# Patient Record
Sex: Male | Born: 1975 | Race: White | Hispanic: No | Marital: Single | Smoking: Current every day smoker
Health system: Southern US, Community
[De-identification: ages and names within clinical notes are randomized; demographics above are authoritative.]

## PROBLEM LIST (undated history)

## (undated) DIAGNOSIS — F32A Depression, unspecified: Secondary | ICD-10-CM

## (undated) DIAGNOSIS — K219 Gastro-esophageal reflux disease without esophagitis: Secondary | ICD-10-CM

## (undated) DIAGNOSIS — J45909 Unspecified asthma, uncomplicated: Secondary | ICD-10-CM

## (undated) DIAGNOSIS — N289 Disorder of kidney and ureter, unspecified: Secondary | ICD-10-CM

## (undated) DIAGNOSIS — M1990 Unspecified osteoarthritis, unspecified site: Secondary | ICD-10-CM

## (undated) DIAGNOSIS — J449 Chronic obstructive pulmonary disease, unspecified: Secondary | ICD-10-CM

## (undated) DIAGNOSIS — R569 Unspecified convulsions: Secondary | ICD-10-CM

## (undated) DIAGNOSIS — G9340 Encephalopathy, unspecified: Secondary | ICD-10-CM

## (undated) DIAGNOSIS — E871 Hypo-osmolality and hyponatremia: Secondary | ICD-10-CM

## (undated) DIAGNOSIS — I509 Heart failure, unspecified: Secondary | ICD-10-CM

## (undated) DIAGNOSIS — F329 Major depressive disorder, single episode, unspecified: Secondary | ICD-10-CM

## (undated) DIAGNOSIS — I10 Essential (primary) hypertension: Secondary | ICD-10-CM

## (undated) DIAGNOSIS — F419 Anxiety disorder, unspecified: Secondary | ICD-10-CM

## (undated) HISTORY — PX: COLONOSCOPY: SHX174

## (undated) HISTORY — PX: FINGER SURGERY: SHX640

## (undated) HISTORY — PX: ESOPHAGOGASTRODUODENOSCOPY: SHX1529

---

## 1999-04-13 ENCOUNTER — Inpatient Hospital Stay (HOSPITAL_COMMUNITY): Admission: AD | Admit: 1999-04-13 | Discharge: 1999-04-17 | Payer: Self-pay | Admitting: *Deleted

## 2005-01-06 ENCOUNTER — Emergency Department: Payer: Self-pay | Admitting: Internal Medicine

## 2005-01-08 ENCOUNTER — Emergency Department: Payer: Self-pay | Admitting: Unknown Physician Specialty

## 2005-01-13 ENCOUNTER — Emergency Department: Payer: Self-pay | Admitting: Emergency Medicine

## 2005-01-30 ENCOUNTER — Emergency Department: Payer: Self-pay | Admitting: Emergency Medicine

## 2005-02-01 ENCOUNTER — Emergency Department: Payer: Self-pay | Admitting: Unknown Physician Specialty

## 2005-02-14 ENCOUNTER — Emergency Department: Payer: Self-pay | Admitting: Emergency Medicine

## 2005-02-21 ENCOUNTER — Emergency Department: Payer: Self-pay | Admitting: Emergency Medicine

## 2005-02-24 ENCOUNTER — Inpatient Hospital Stay: Payer: Self-pay | Admitting: Internal Medicine

## 2005-07-07 ENCOUNTER — Other Ambulatory Visit: Payer: Self-pay

## 2005-07-07 ENCOUNTER — Emergency Department: Payer: Self-pay | Admitting: Internal Medicine

## 2005-10-14 ENCOUNTER — Emergency Department: Payer: Self-pay | Admitting: General Practice

## 2005-11-03 ENCOUNTER — Emergency Department: Payer: Self-pay | Admitting: Emergency Medicine

## 2005-11-03 ENCOUNTER — Other Ambulatory Visit: Payer: Self-pay

## 2005-11-06 ENCOUNTER — Emergency Department: Payer: Self-pay | Admitting: Internal Medicine

## 2005-12-01 ENCOUNTER — Emergency Department: Payer: Self-pay | Admitting: Emergency Medicine

## 2005-12-18 ENCOUNTER — Other Ambulatory Visit: Payer: Self-pay

## 2005-12-18 ENCOUNTER — Emergency Department: Payer: Self-pay | Admitting: Emergency Medicine

## 2005-12-20 ENCOUNTER — Emergency Department: Payer: Self-pay | Admitting: Emergency Medicine

## 2005-12-20 ENCOUNTER — Other Ambulatory Visit: Payer: Self-pay

## 2006-01-31 ENCOUNTER — Emergency Department: Payer: Self-pay | Admitting: Emergency Medicine

## 2006-01-31 ENCOUNTER — Other Ambulatory Visit: Payer: Self-pay

## 2006-02-08 ENCOUNTER — Other Ambulatory Visit: Payer: Self-pay

## 2006-02-08 ENCOUNTER — Emergency Department: Payer: Self-pay | Admitting: Emergency Medicine

## 2006-04-30 ENCOUNTER — Emergency Department: Payer: Self-pay | Admitting: Emergency Medicine

## 2006-05-10 ENCOUNTER — Emergency Department: Payer: Self-pay | Admitting: Emergency Medicine

## 2006-05-31 ENCOUNTER — Encounter: Payer: Self-pay | Admitting: Unknown Physician Specialty

## 2006-06-11 ENCOUNTER — Emergency Department: Payer: Self-pay | Admitting: Emergency Medicine

## 2006-06-18 ENCOUNTER — Emergency Department: Payer: Self-pay | Admitting: Unknown Physician Specialty

## 2006-06-21 ENCOUNTER — Encounter: Payer: Self-pay | Admitting: Unknown Physician Specialty

## 2006-07-20 ENCOUNTER — Emergency Department: Payer: Self-pay | Admitting: Emergency Medicine

## 2006-08-01 ENCOUNTER — Emergency Department: Payer: Self-pay | Admitting: Emergency Medicine

## 2006-08-24 ENCOUNTER — Emergency Department: Payer: Self-pay | Admitting: Emergency Medicine

## 2006-08-26 ENCOUNTER — Other Ambulatory Visit: Payer: Self-pay

## 2006-08-26 ENCOUNTER — Emergency Department: Payer: Self-pay | Admitting: Emergency Medicine

## 2006-08-28 ENCOUNTER — Inpatient Hospital Stay: Payer: Self-pay | Admitting: Internal Medicine

## 2006-08-28 ENCOUNTER — Other Ambulatory Visit: Payer: Self-pay

## 2006-09-08 ENCOUNTER — Emergency Department: Payer: Self-pay | Admitting: Internal Medicine

## 2006-09-08 ENCOUNTER — Other Ambulatory Visit: Payer: Self-pay

## 2006-09-16 ENCOUNTER — Emergency Department: Payer: Self-pay | Admitting: Emergency Medicine

## 2006-09-29 ENCOUNTER — Other Ambulatory Visit: Payer: Self-pay

## 2006-09-29 ENCOUNTER — Emergency Department: Payer: Self-pay

## 2006-11-07 ENCOUNTER — Other Ambulatory Visit: Payer: Self-pay

## 2006-11-08 ENCOUNTER — Inpatient Hospital Stay: Payer: Self-pay | Admitting: Internal Medicine

## 2006-12-20 ENCOUNTER — Emergency Department: Payer: Self-pay | Admitting: Emergency Medicine

## 2007-01-08 ENCOUNTER — Emergency Department: Payer: Self-pay | Admitting: General Practice

## 2007-01-31 ENCOUNTER — Emergency Department (HOSPITAL_COMMUNITY): Admission: EM | Admit: 2007-01-31 | Discharge: 2007-01-31 | Payer: Self-pay | Admitting: Emergency Medicine

## 2007-04-01 IMAGING — CT CT HEAD WITHOUT CONTRAST
2 series · 16 of 30 positions shown, 20 images · non-contrast
Comparison: none

REASON FOR EXAM: headache / passed out / [HOSPITAL]
COMMENTS:

[Series 2: without · axial · non-contrast · 0.41mm/px · z∈[-158,-32]mm · 13 of 31 slices shown, 17 images]
[im 3/31  brain]
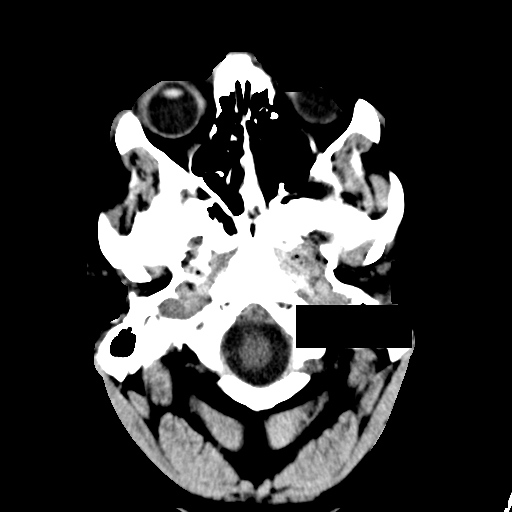
[im 3/31  bone]
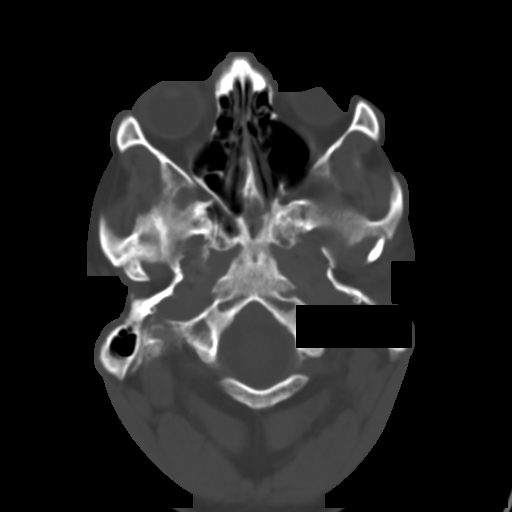
[im 5/31  brain]
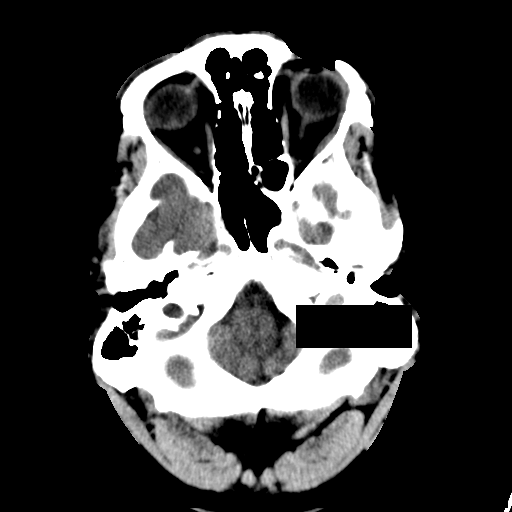
[im 7/31  brain]
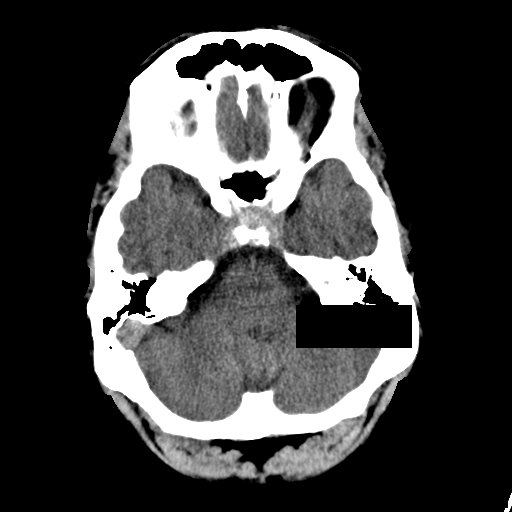
[im 9/31  brain]
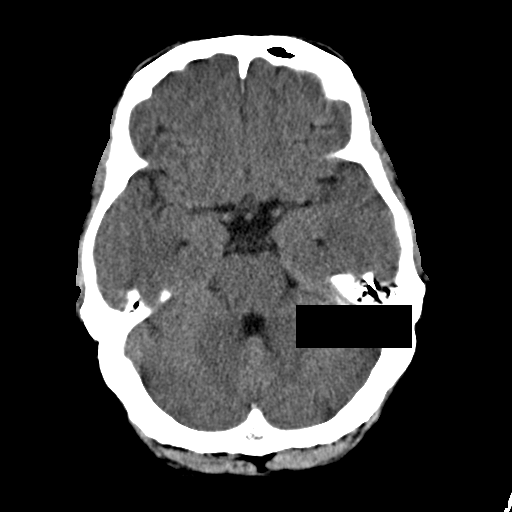
[im 11/31  brain]
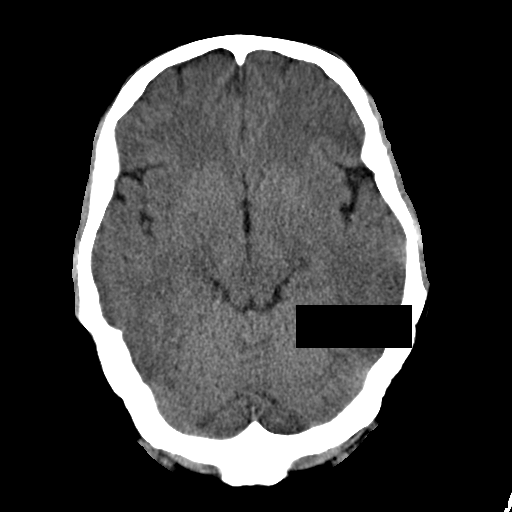
[im 11/31  bone]
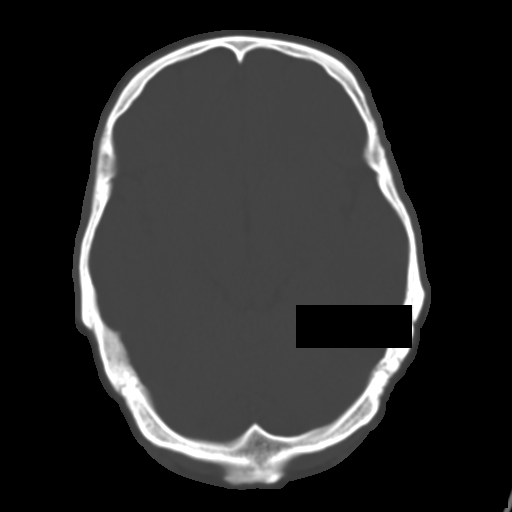
[im 13/31  brain]
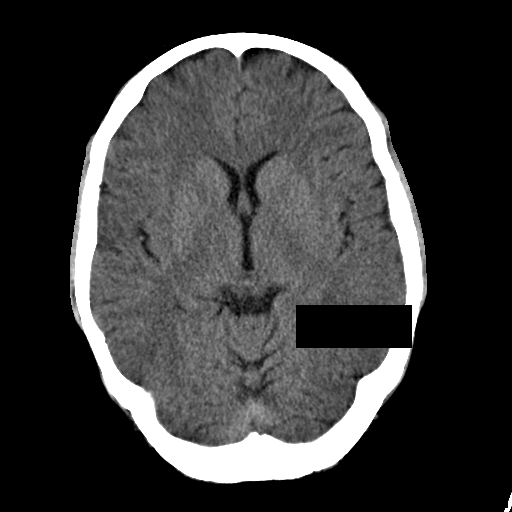
[im 16/31  brain]
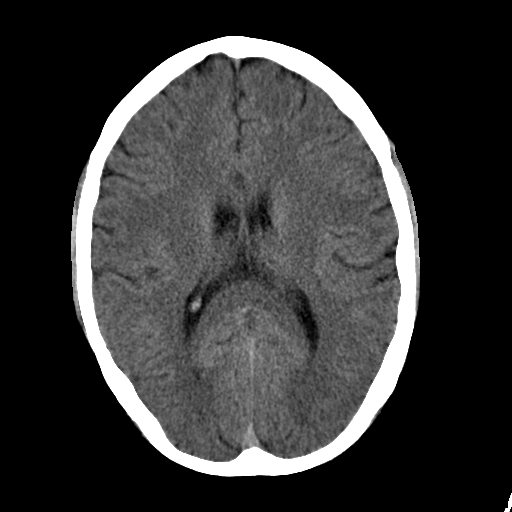
[im 18/31  brain]
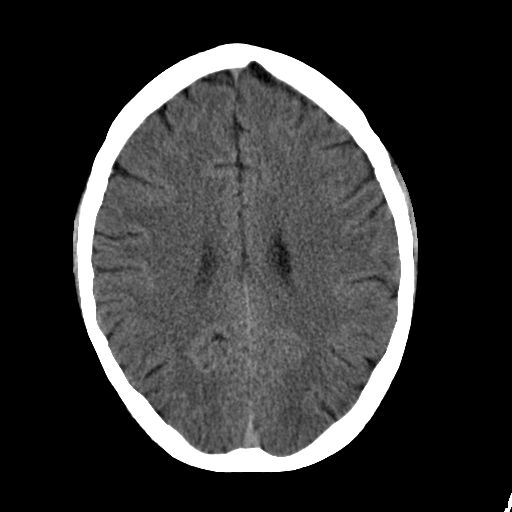
[im 20/31  brain]
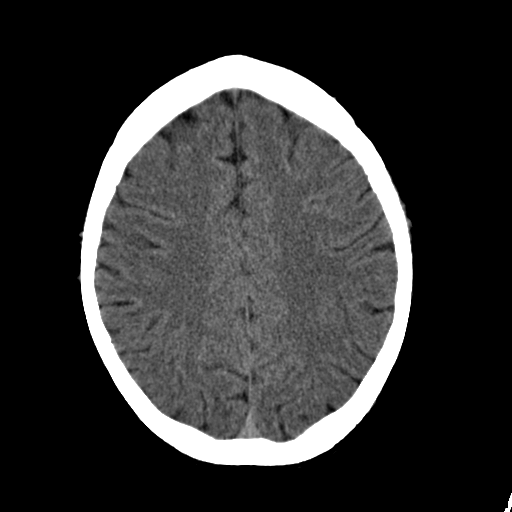
[im 20/31  bone]
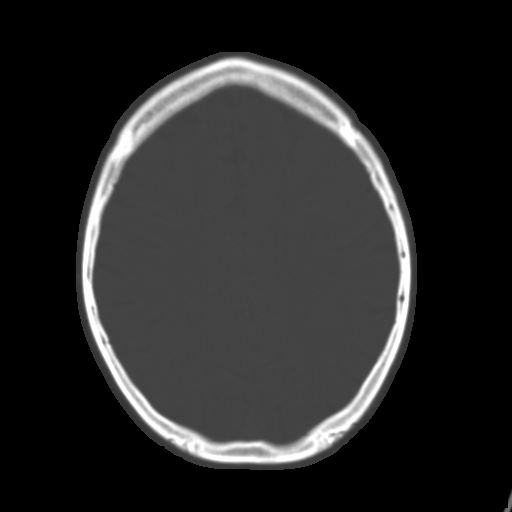
[im 22/31  brain]
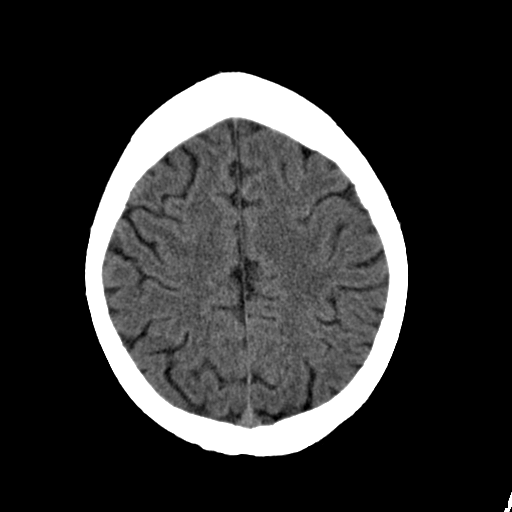
[im 24/31  brain]
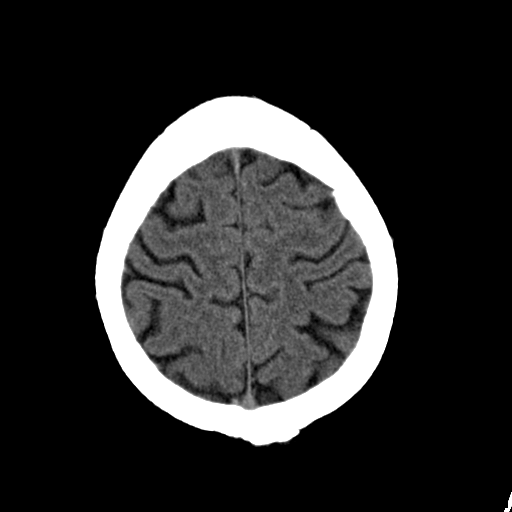
[im 26/31  brain]
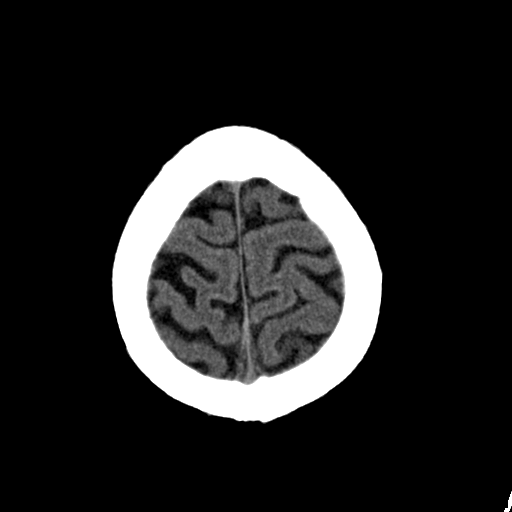
[im 28/31  brain]
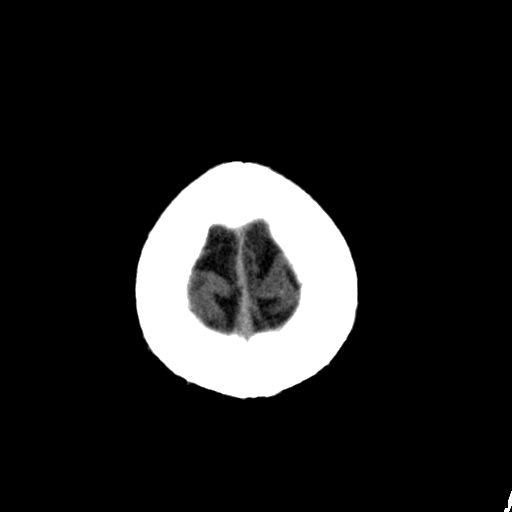
[im 28/31  bone]
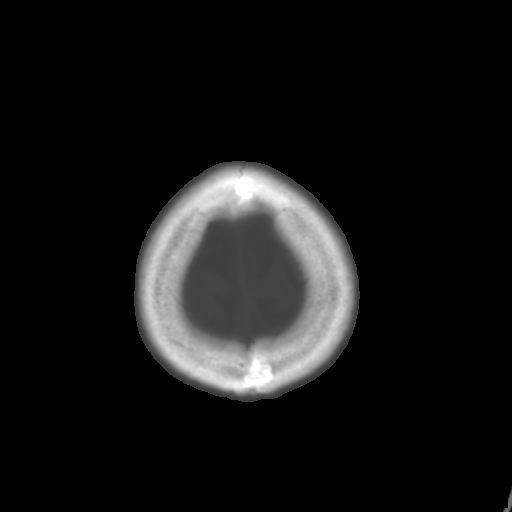

[Series 3: bone windows · axial · 0.41mm/px · z∈[-158,-118]mm · 3 of 31 slices shown]
[im 3/31  bone]
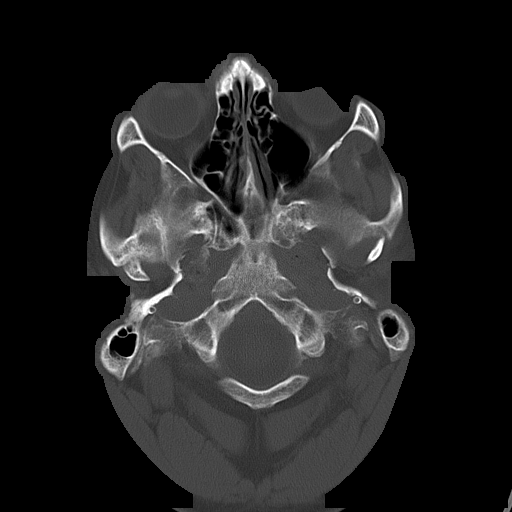
[im 7/31  bone]
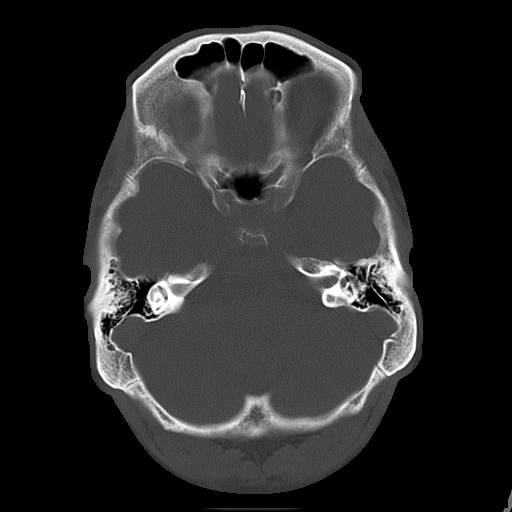
[im 11/31  bone]
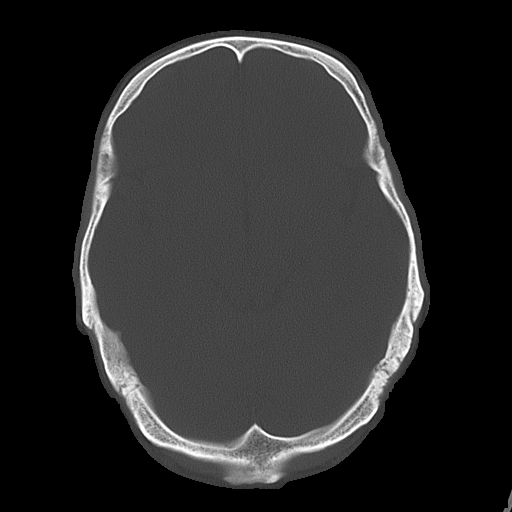

[16 of 30 positions shown; findings below may reference images not displayed]

PROCEDURE:     CT  - CT HEAD WITHOUT CONTRAST  - February 24, 2005 [DATE]

RESULT:     The patient sustained head trauma and had a syncopal episode.

The ventricles are normal in size and position. There is no evidence of a
mass nor mass effect. I see no intracranial hemorrhage. At bone window
settings there is soft tissue swelling over the RIGHT forehead region from
prior trauma.  I see no air fluid levels in the visualized portions of the
paranasal sinuses. There is mucoperiosteal thickening in a LEFT sphenoid
sinus cell.
IMPRESSION: 1)I see no evidence of an intracranial hemorrhage or other acute
intracranial abnormality.

2)There is soft tissue swelling over the RIGHT forehead region, but I do not
see evidence of an acute skull fracture.

3)There is a small amount of soft tissue density material in a LEFT sphenoid
sinus cell.

## 2007-05-03 ENCOUNTER — Emergency Department: Payer: Self-pay | Admitting: Unknown Physician Specialty

## 2007-05-06 ENCOUNTER — Emergency Department: Payer: Self-pay

## 2007-05-07 ENCOUNTER — Emergency Department: Payer: Self-pay | Admitting: Emergency Medicine

## 2007-05-10 ENCOUNTER — Emergency Department: Payer: Self-pay | Admitting: Emergency Medicine

## 2007-05-17 ENCOUNTER — Emergency Department: Payer: Self-pay | Admitting: Unknown Physician Specialty

## 2007-05-22 ENCOUNTER — Emergency Department: Payer: Self-pay | Admitting: Emergency Medicine

## 2007-05-31 ENCOUNTER — Emergency Department: Payer: Self-pay | Admitting: Emergency Medicine

## 2007-06-01 ENCOUNTER — Emergency Department: Payer: Self-pay | Admitting: Emergency Medicine

## 2007-06-01 ENCOUNTER — Emergency Department (HOSPITAL_COMMUNITY): Admission: EM | Admit: 2007-06-01 | Discharge: 2007-06-02 | Payer: Self-pay | Admitting: Emergency Medicine

## 2007-06-09 ENCOUNTER — Emergency Department (HOSPITAL_COMMUNITY): Admission: EM | Admit: 2007-06-09 | Discharge: 2007-06-09 | Payer: Self-pay | Admitting: Emergency Medicine

## 2007-06-12 ENCOUNTER — Emergency Department: Payer: Self-pay | Admitting: Emergency Medicine

## 2007-06-26 ENCOUNTER — Emergency Department (HOSPITAL_COMMUNITY): Admission: EM | Admit: 2007-06-26 | Discharge: 2007-06-26 | Payer: Self-pay | Admitting: Emergency Medicine

## 2007-07-30 ENCOUNTER — Emergency Department: Payer: Self-pay | Admitting: Internal Medicine

## 2007-08-12 IMAGING — CT CT HEAD WITHOUT CONTRAST
1 of 2 series · 16 of 30 positions shown, 20 images · non-contrast
Comparison: none

REASON FOR EXAM: altered mental status
COMMENTS:

[Series 3: without · axial · non-contrast · 0.41mm/px · z∈[+236,+342]mm · 16 of 25 slices shown, 20 images]
[im 2/25  brain]
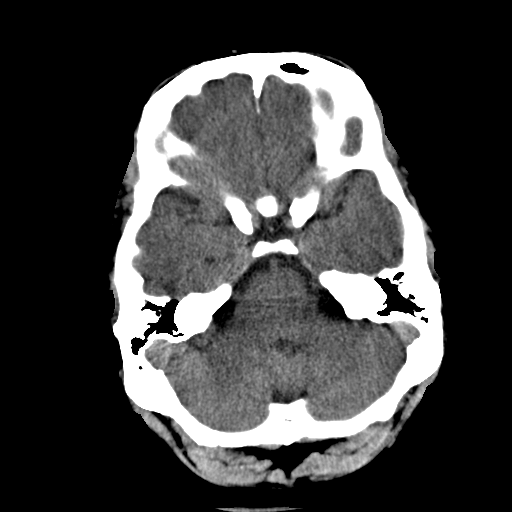
[im 2/25  bone]
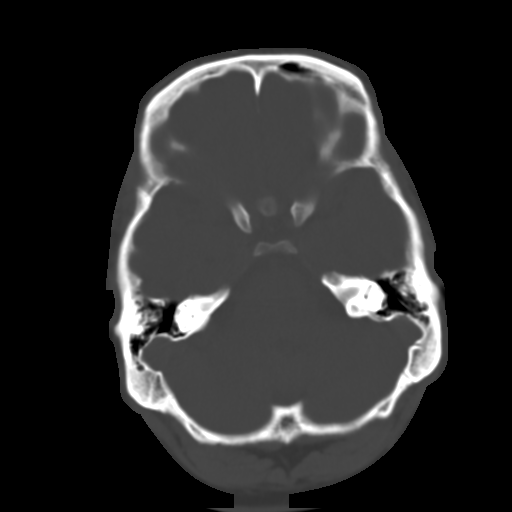
[im 3/25  brain]
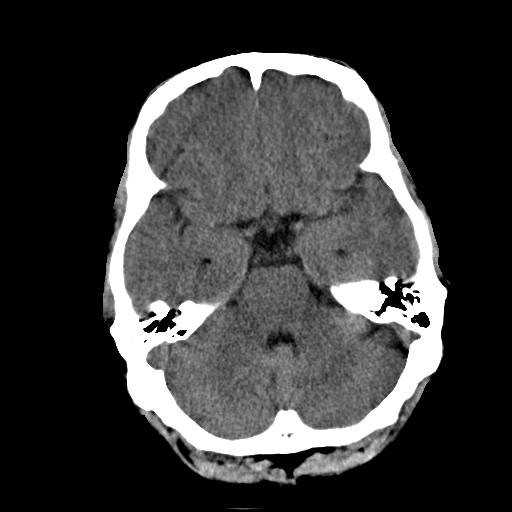
[im 4/25  brain]
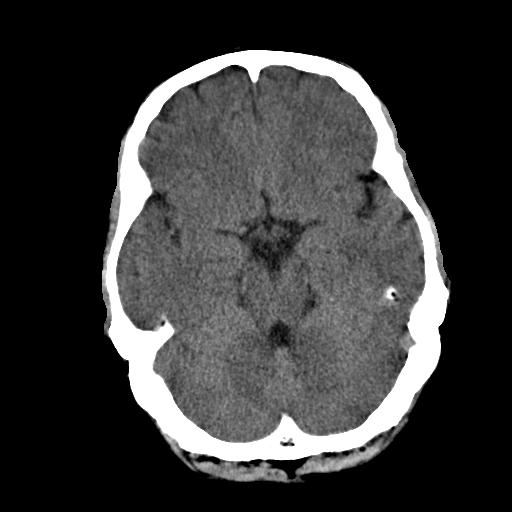
[im 6/25  brain]
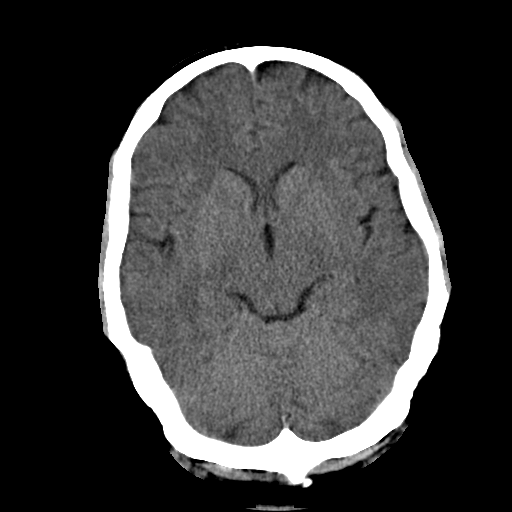
[im 8/25  brain]
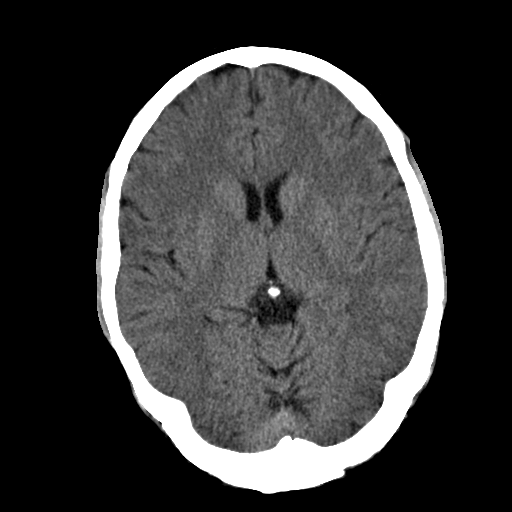
[im 8/25  bone]
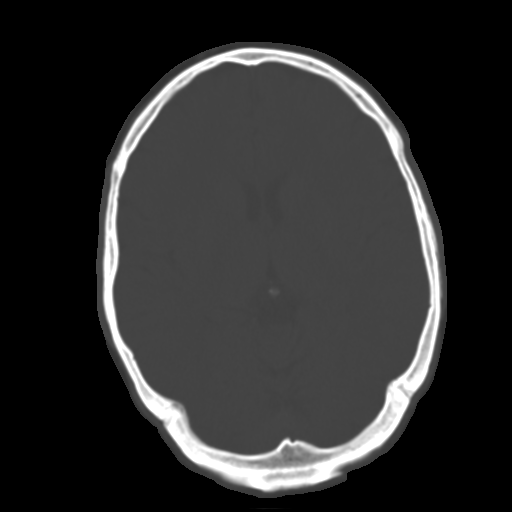
[im 9/25  brain]
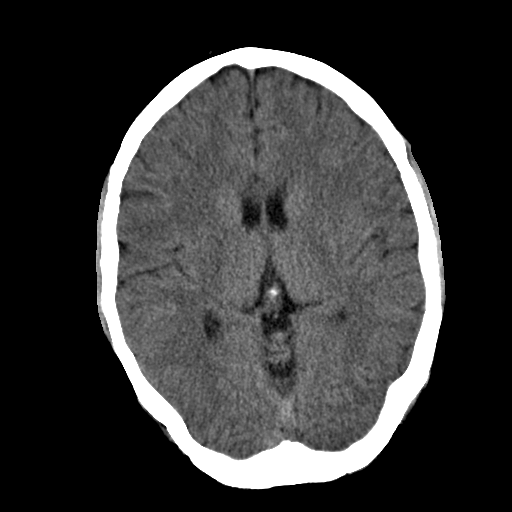
[im 11/25  brain]
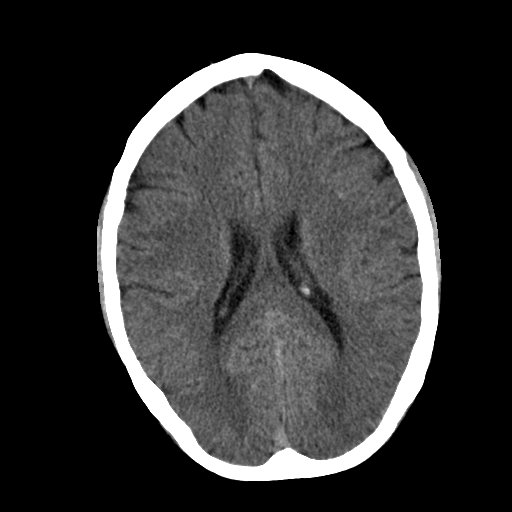
[im 12/25  brain]
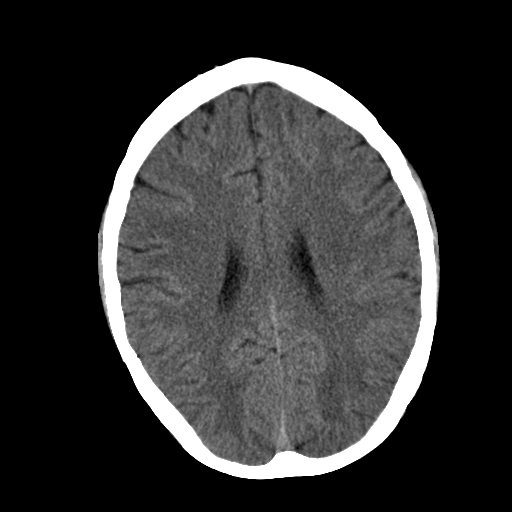
[im 13/25  brain]
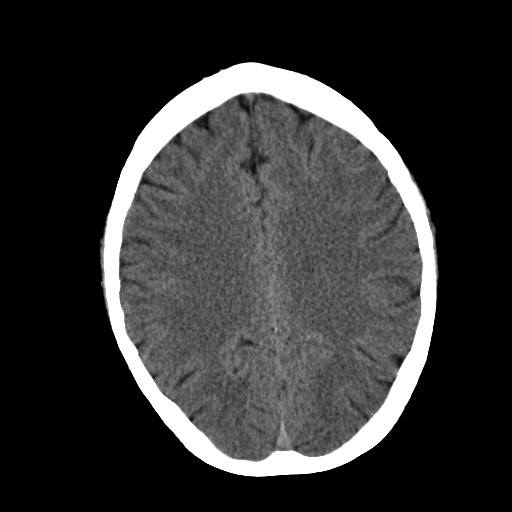
[im 13/25  bone]
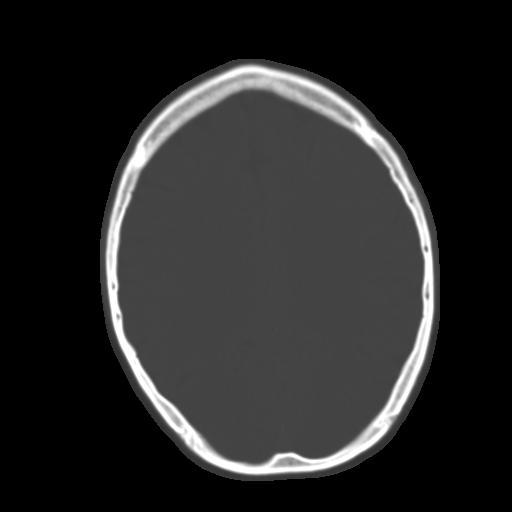
[im 14/25  brain]
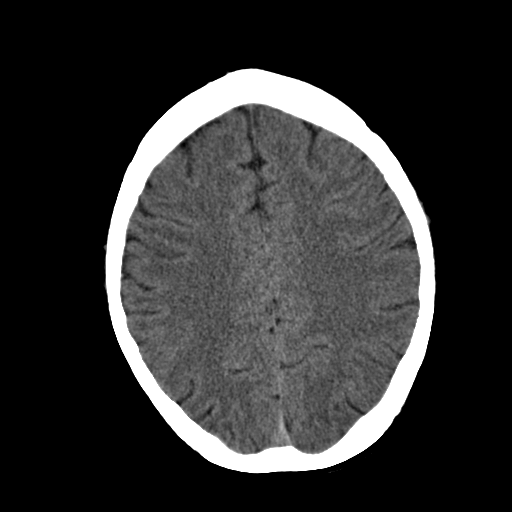
[im 16/25  brain]
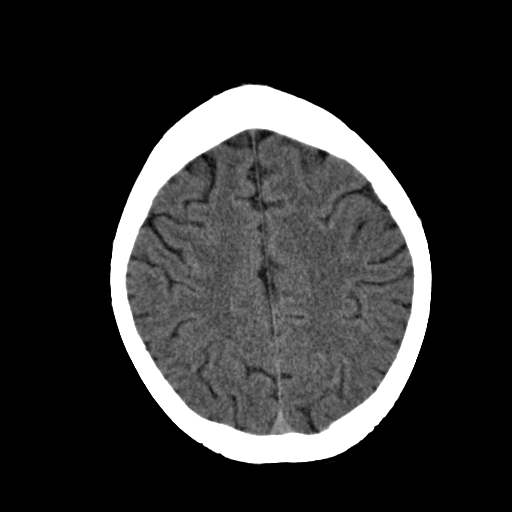
[im 17/25  brain]
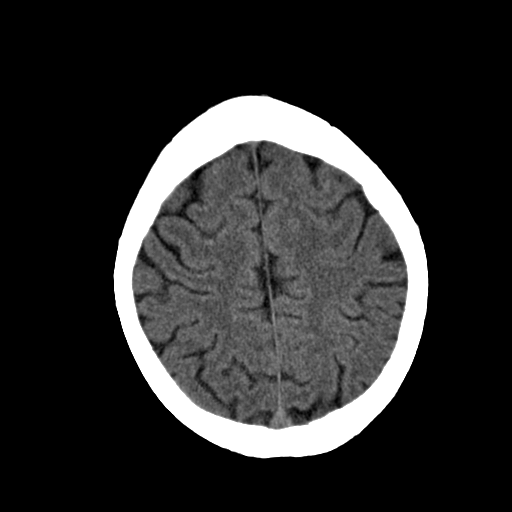
[im 19/25  brain]
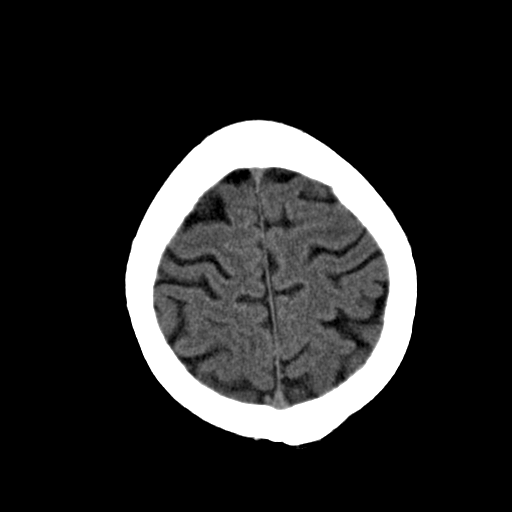
[im 19/25  bone]
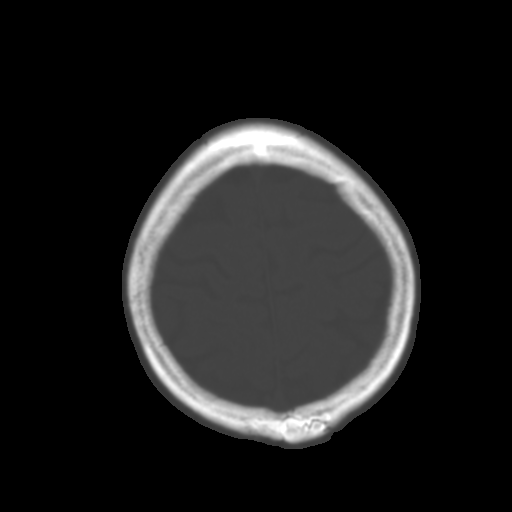
[im 21/25  brain]
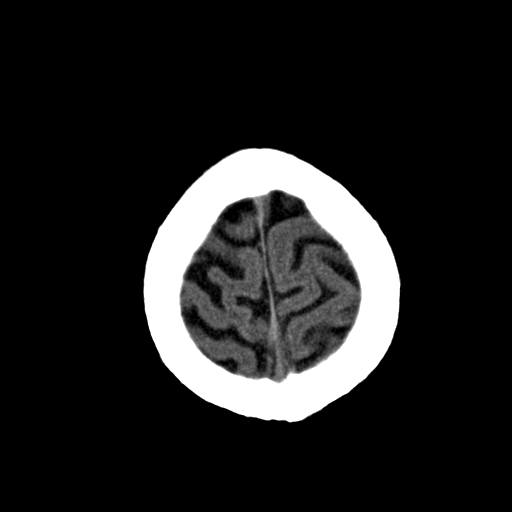
[im 22/25  brain]
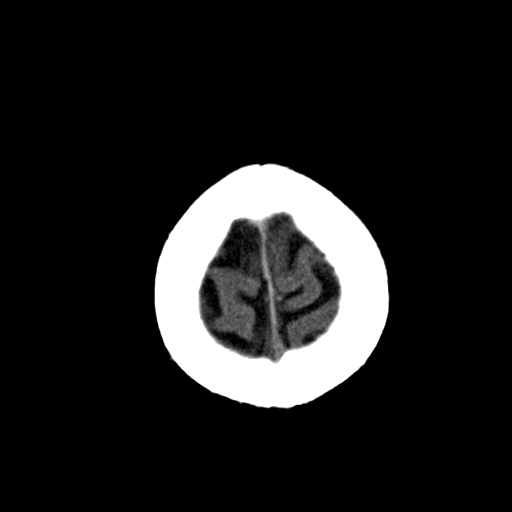
[im 23/25  brain]
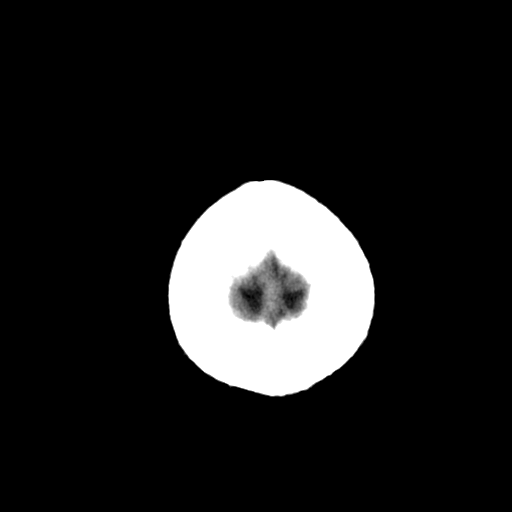

[16 of 30 positions shown; findings below may reference images not displayed]

PROCEDURE:     CT  - CT HEAD WITHOUT CONTRAST  - July 07, 2005  [DATE]

RESULT:         A preliminary report was rendered to Dr. Kjell-Einar on 07/07/05
at [DATE] p.m.

This study was compared to a previous study dated 02/24/05.

There is no evidence of acute hemorrhage.  No secondary signs are
appreciated to suggest mass effect, subacute or chronic infarction.  Mild
mucosal thickening is demonstrated within the ethmoid sinuses.
IMPRESSION: No evidence of intracranial abnormalities.

## 2007-08-12 IMAGING — CR DG CHEST 2V
1 series · 2 of 2 positions shown · non-contrast
Comparison: none

REASON FOR EXAM: chest pain   [HOSPITAL]
COMMENTS:

[Series 1: view not recorded · 0.17mm/px · 2 of 2 slices shown]
[im 1/2]
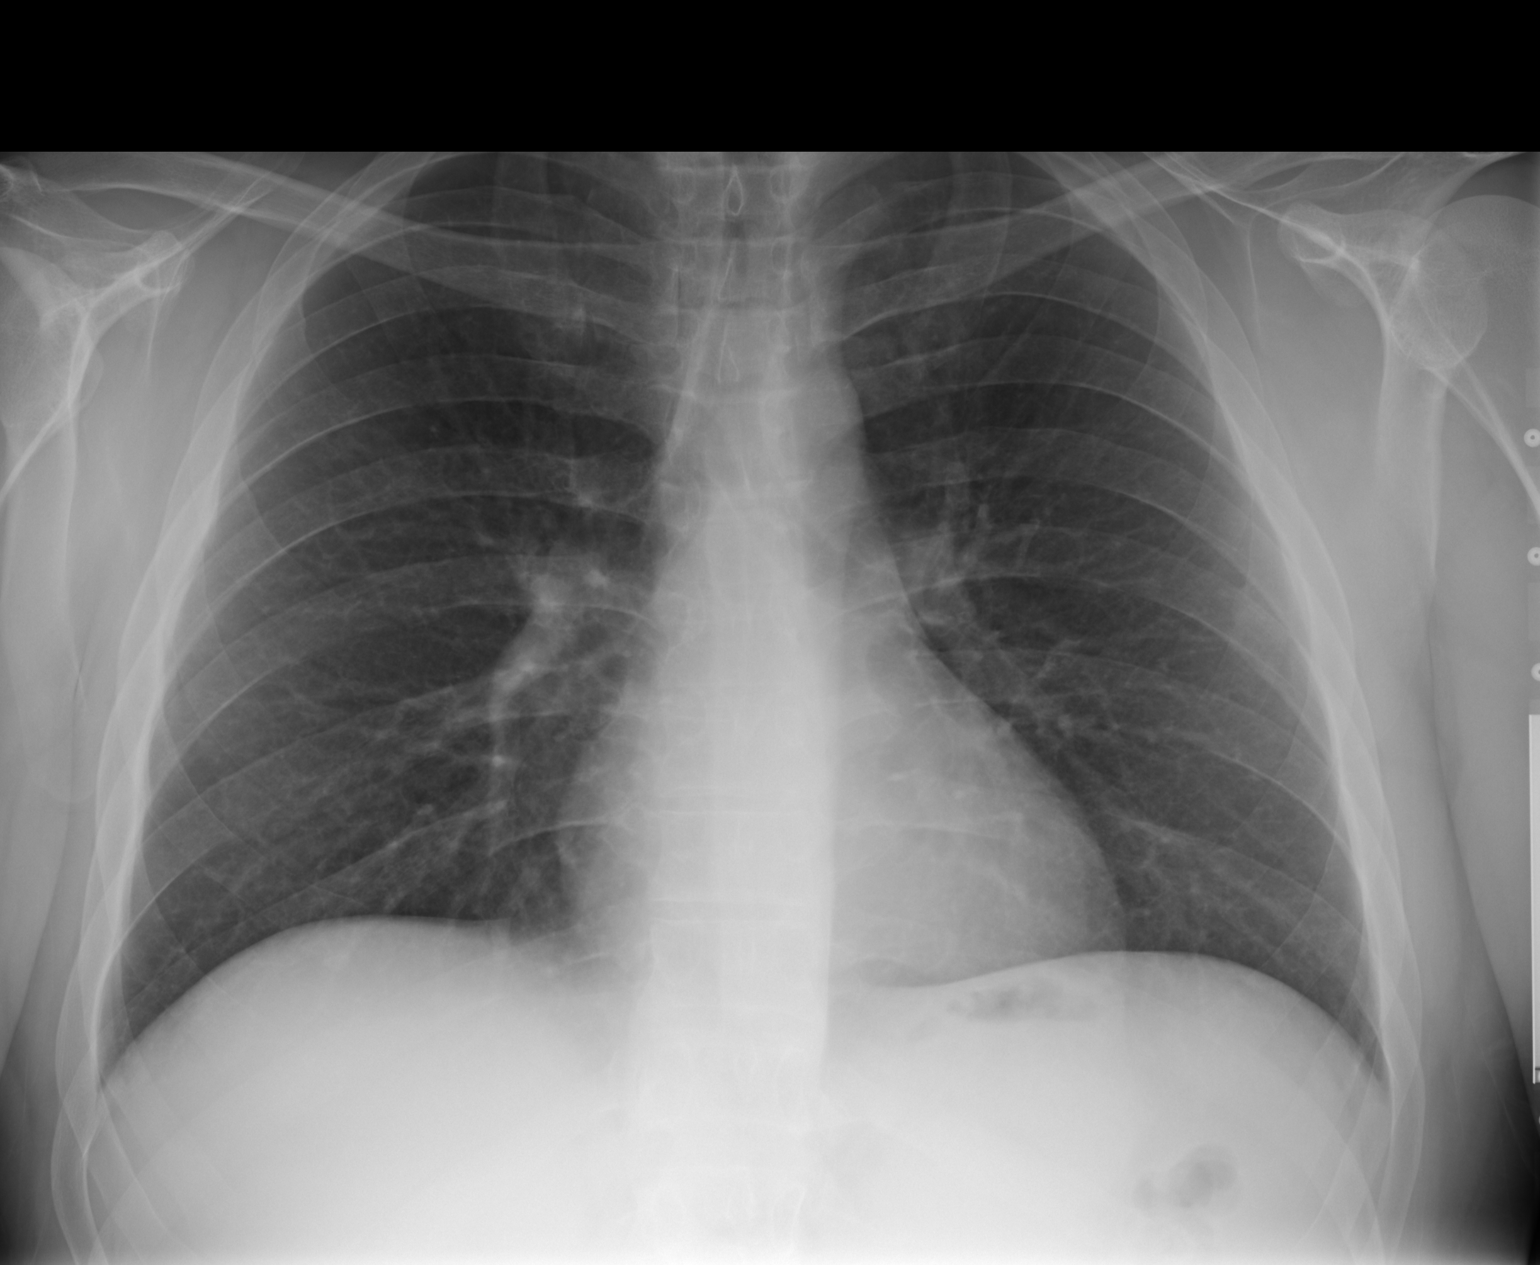
[im 2/2]
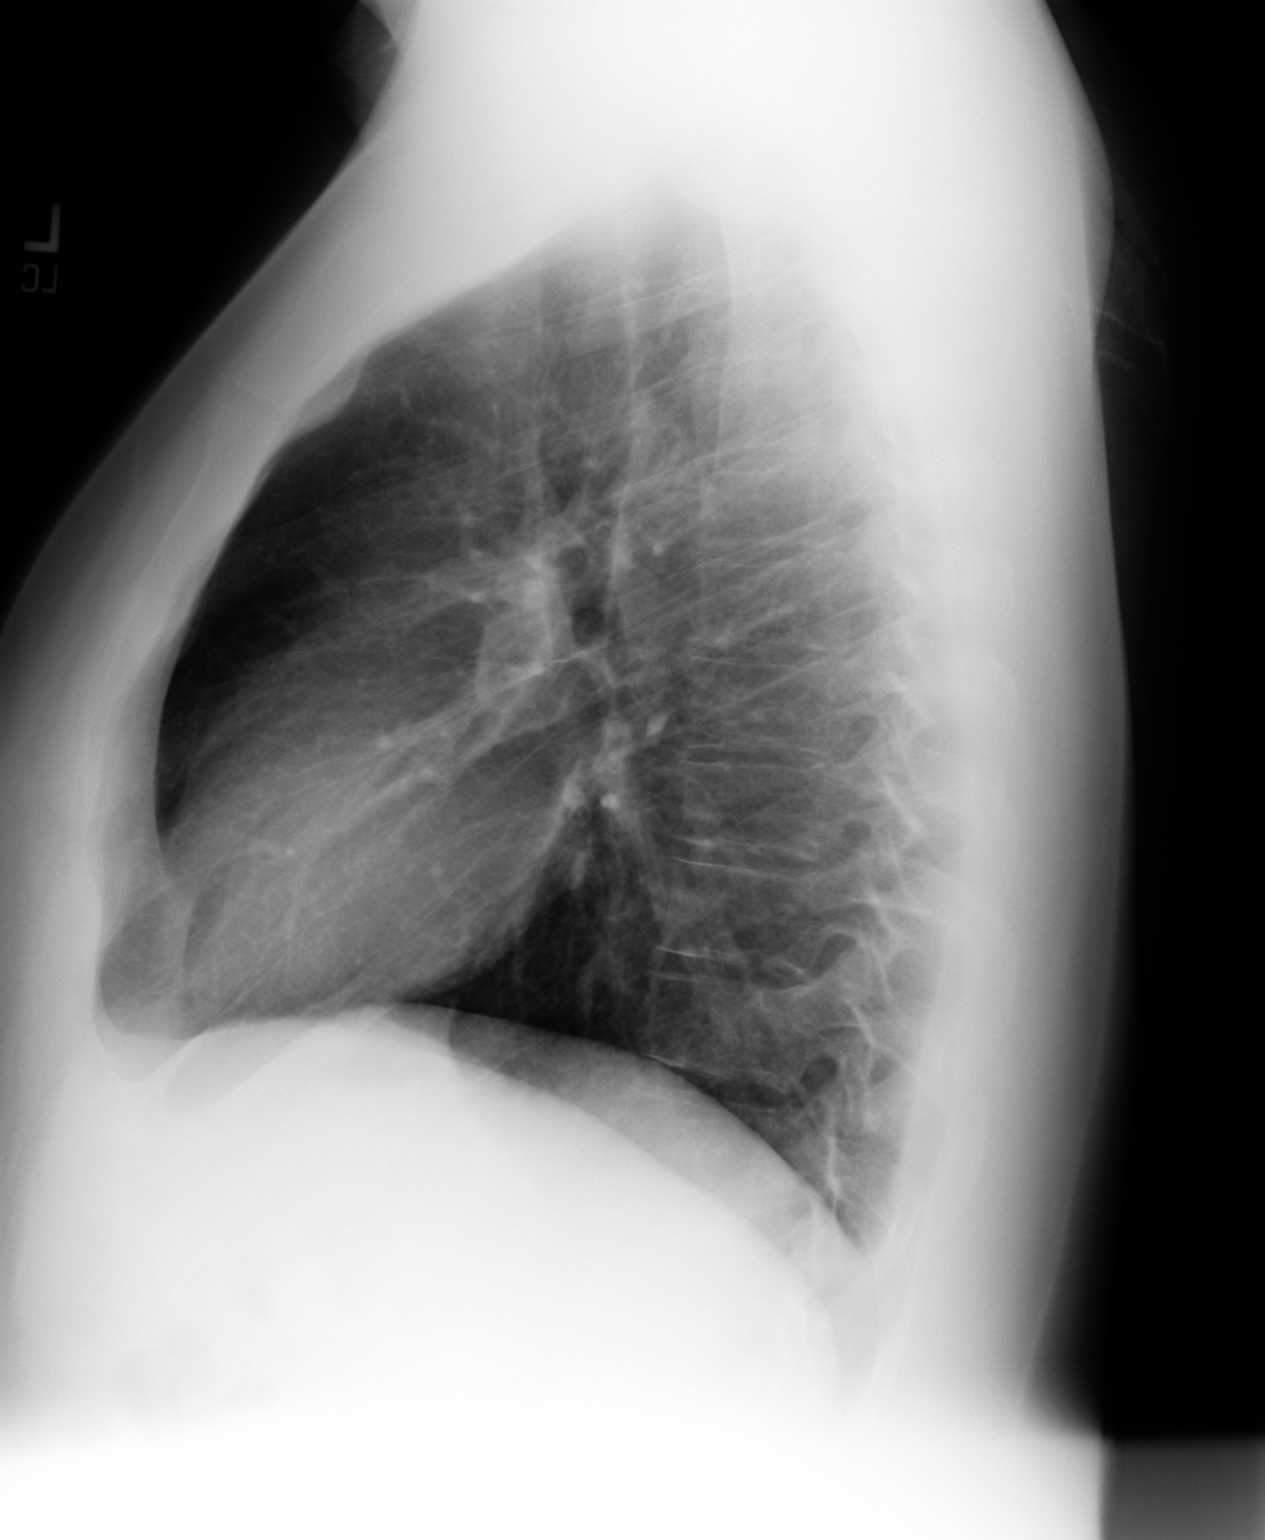

[2 of 2 positions shown; findings below may reference images not displayed]

PROCEDURE:     DXR - DXR CHEST PA (OR AP) AND LATERAL  - July 07, 2005  [DATE]

RESULT:     The current exam is compared to the prior exam of 01-30-05.  The
lung fields are clear. No pneumonia, pneumothorax or pleural effusion is
seen. The heart, mediastinal and osseous structures show no significant
abnormalities.
IMPRESSION: 1)No significant abnormalities are noted.

## 2007-10-15 ENCOUNTER — Emergency Department: Payer: Self-pay | Admitting: Emergency Medicine

## 2007-10-23 ENCOUNTER — Emergency Department: Payer: Self-pay | Admitting: Emergency Medicine

## 2007-11-01 ENCOUNTER — Emergency Department: Payer: Self-pay | Admitting: Emergency Medicine

## 2008-01-23 IMAGING — CR DG CHEST 2V
1 series · 2 of 2 positions shown · non-contrast
Comparison: none

REASON FOR EXAM: chest pain  rm 17
COMMENTS:

[Series 3500: postero_anterior · 0.11mm/px · 2 of 2 slices shown]
[im 1/2]
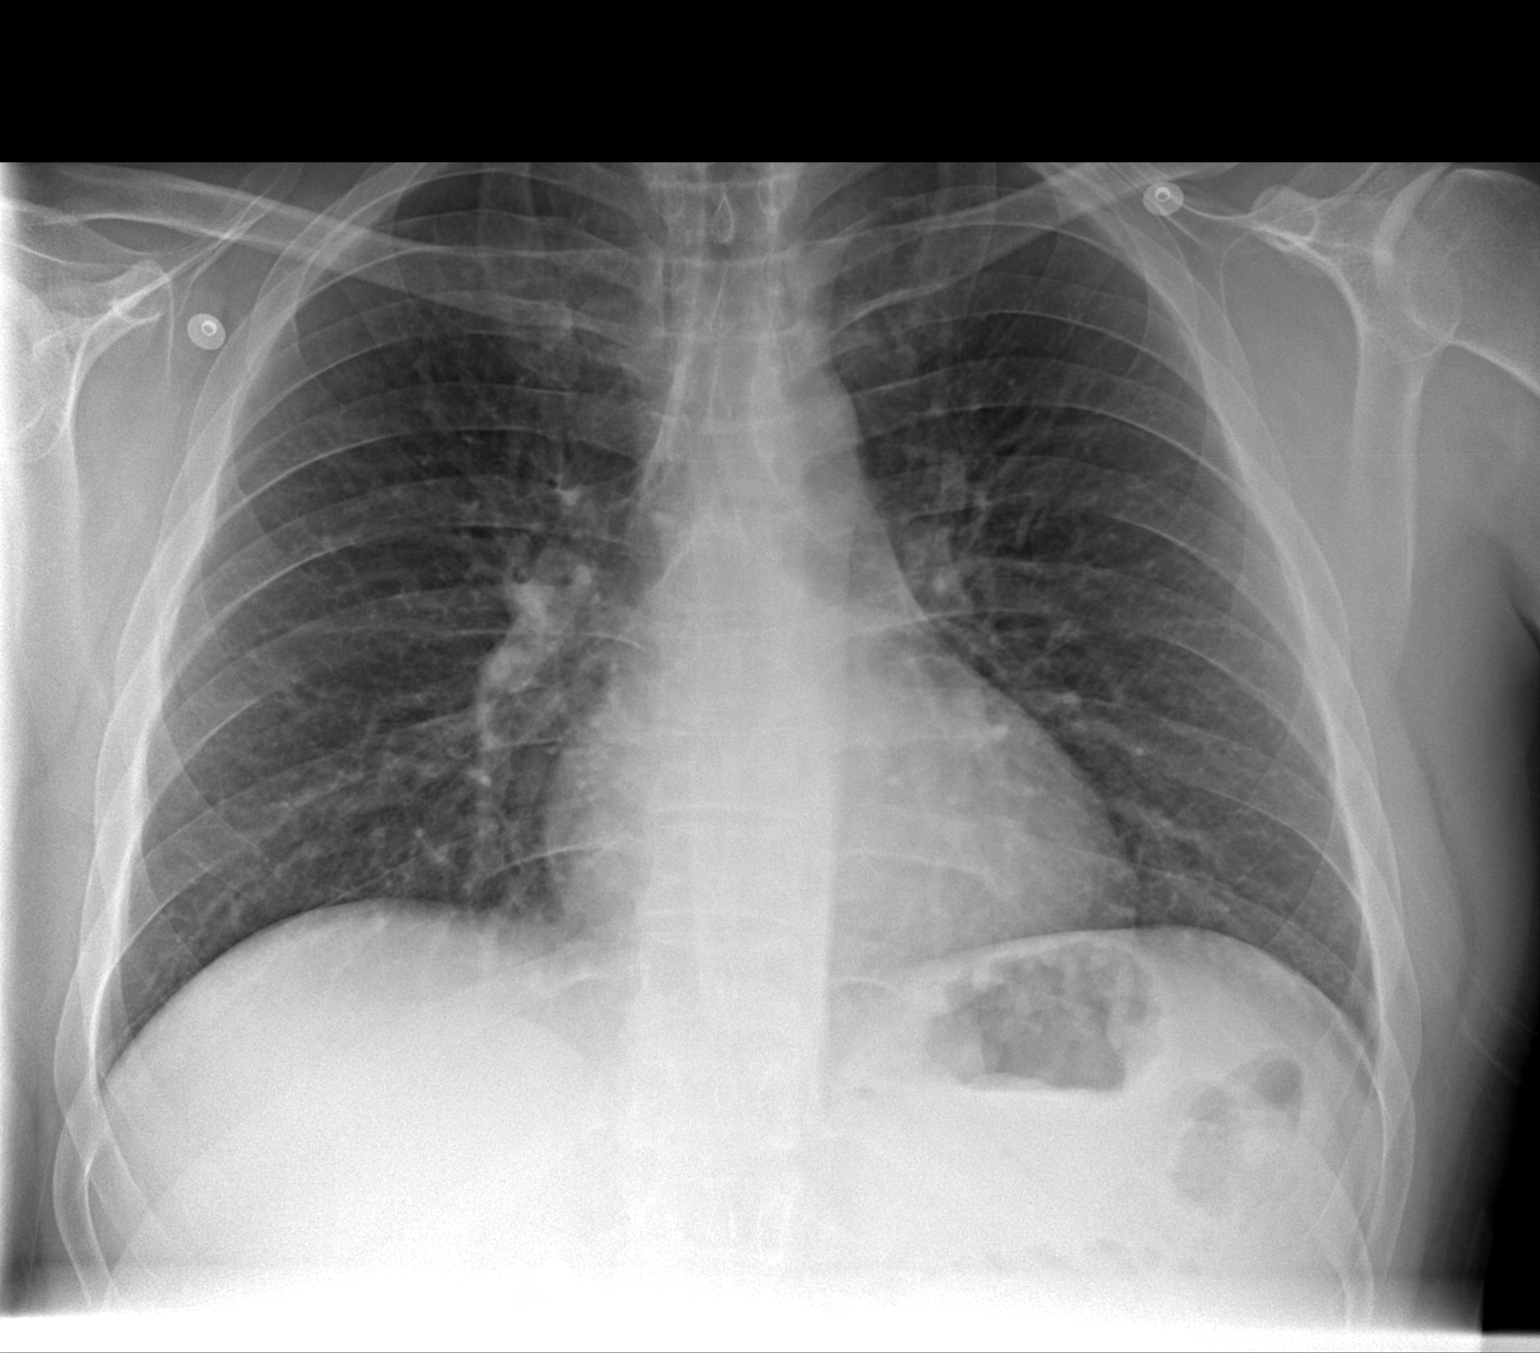
[im 2/2]
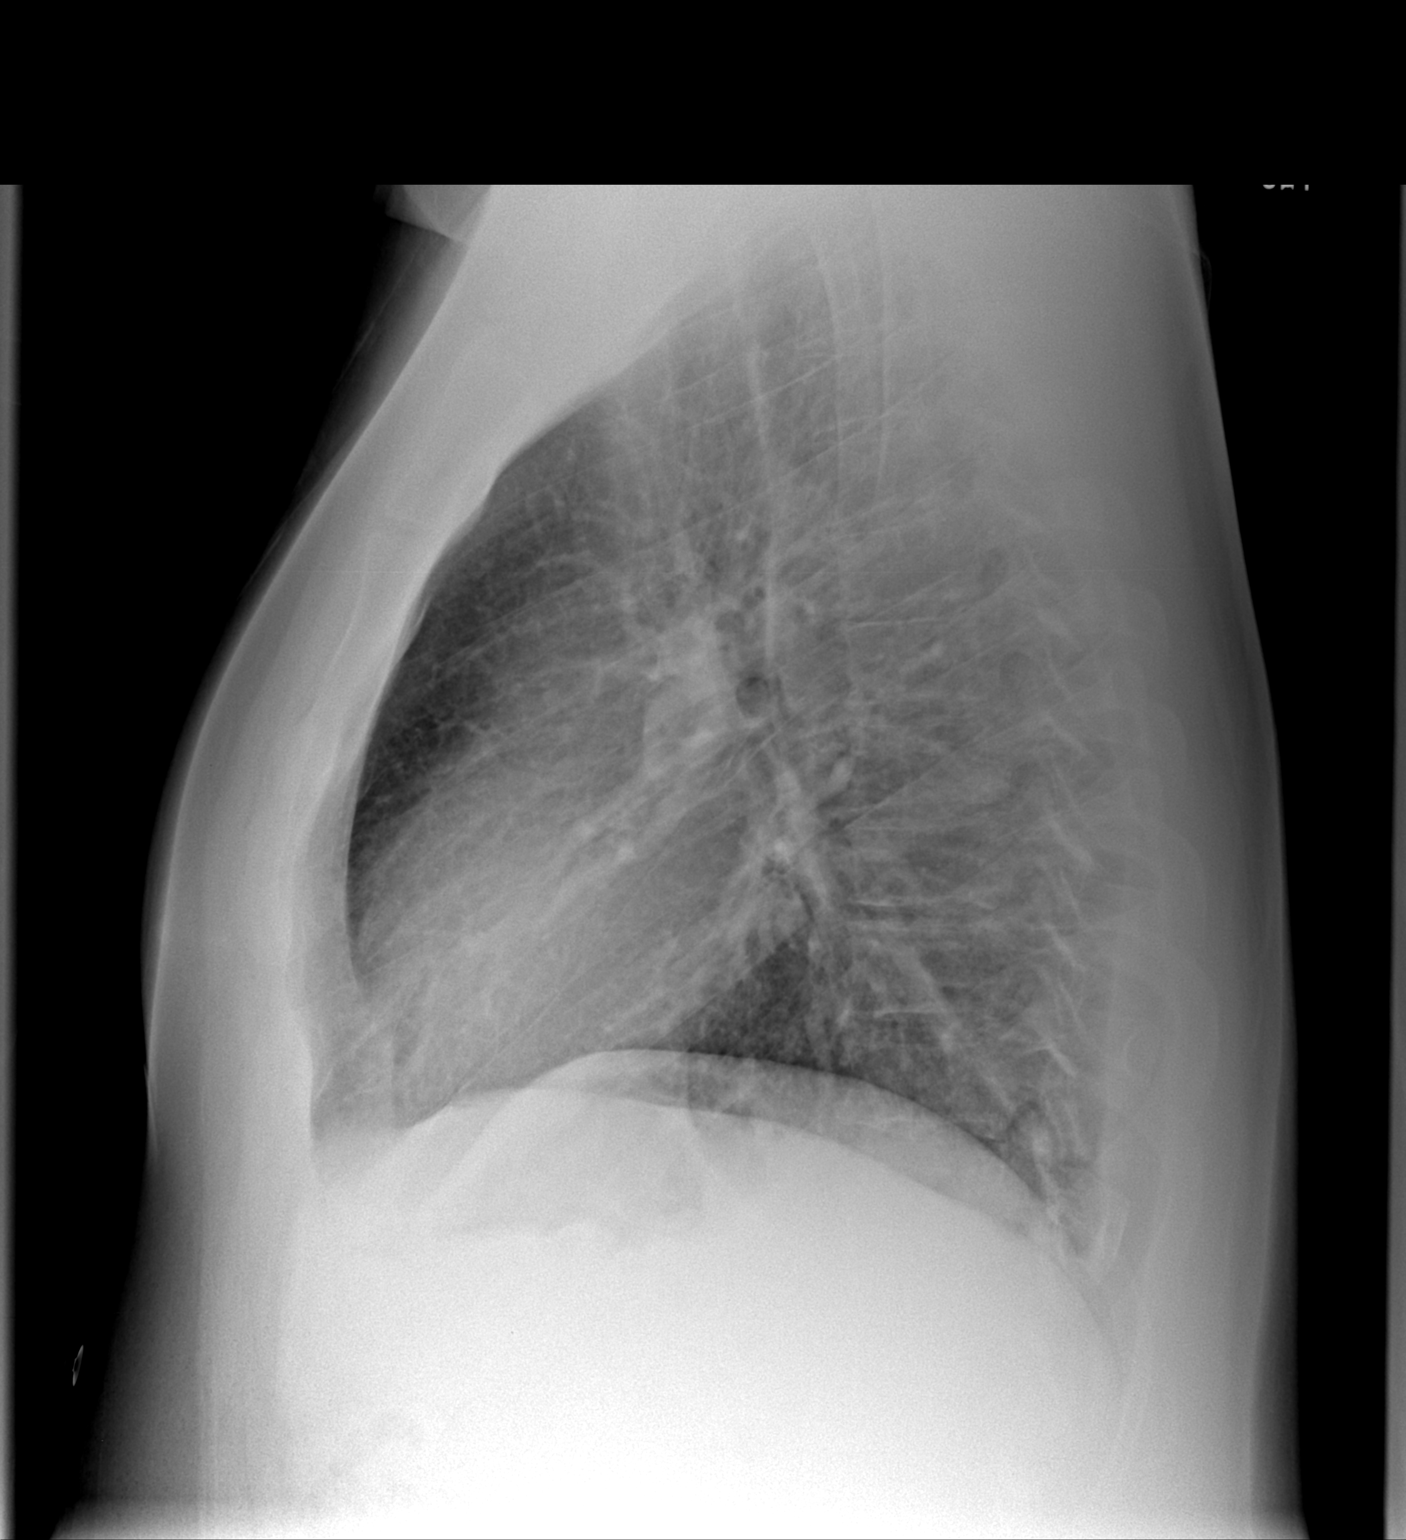

[2 of 2 positions shown; findings below may reference images not displayed]

PROCEDURE:     DXR - DXR CHEST PA (OR AP) AND LATERAL  - December 18, 2005 [DATE]

RESULT:     Comparison is made to study 07 July, 2005.

The lungs are adequately inflated. There is no focal infiltrate. The
interstitial markings are more prominent than on the prior study.  The heart
is not enlarged and the pulmonary vascularity is not engorged.
IMPRESSION: 1)Mildly increased interstitial markings are noted.  This may be at least in
part technical in nature, but I cannot exclude mild interstitial edema in
the appropriate clinical setting. The cardiac silhouette is not enlarged.
There is no focal pneumonia.  Follow-up films are recommended.

## 2008-01-23 IMAGING — CT CT HEAD WITHOUT CONTRAST
2 series · 16 of 30 positions shown, 20 images · non-contrast
Comparison: none

REASON FOR EXAM: Passed [DATE]
COMMENTS:

[Series 2: without · axial · non-contrast · 0.45mm/px · z∈[-151,-31]mm · 13 of 30 slices shown, 17 images]
[im 3/30  brain]
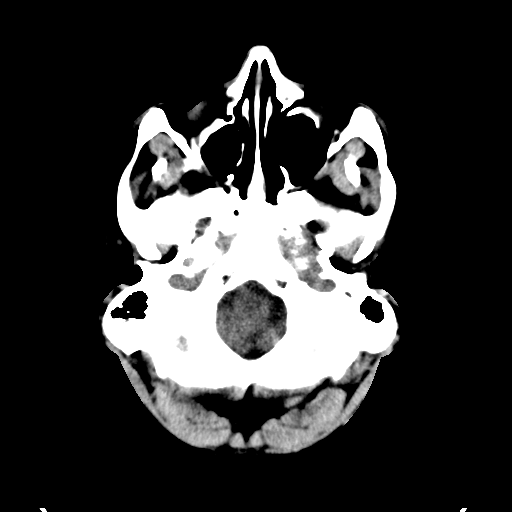
[im 3/30  bone]
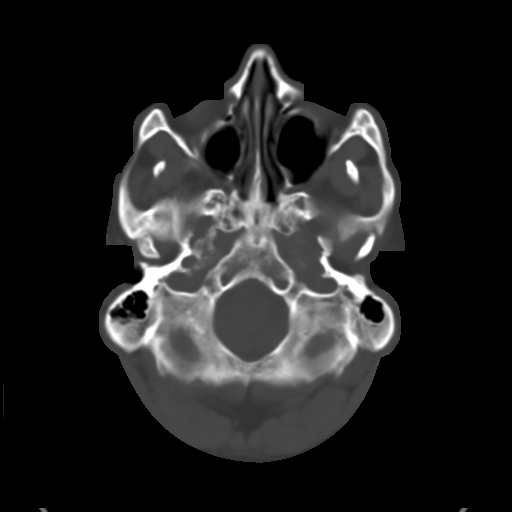
[im 5/30  brain]
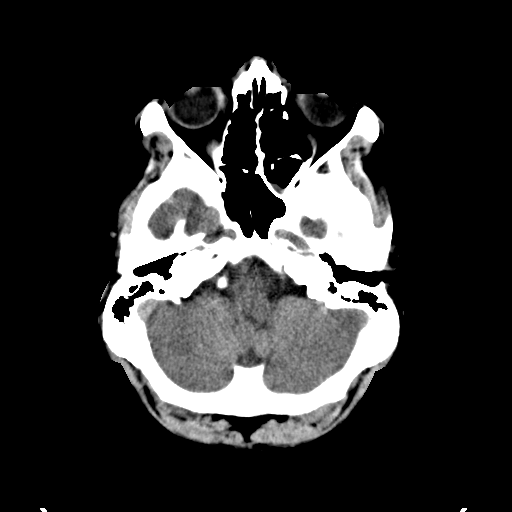
[im 7/30  brain]
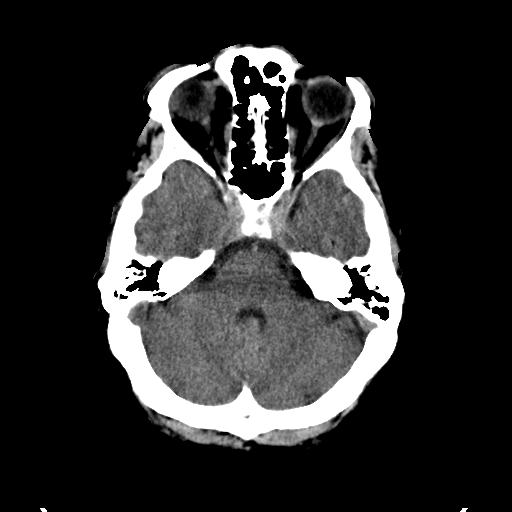
[im 9/30  brain]
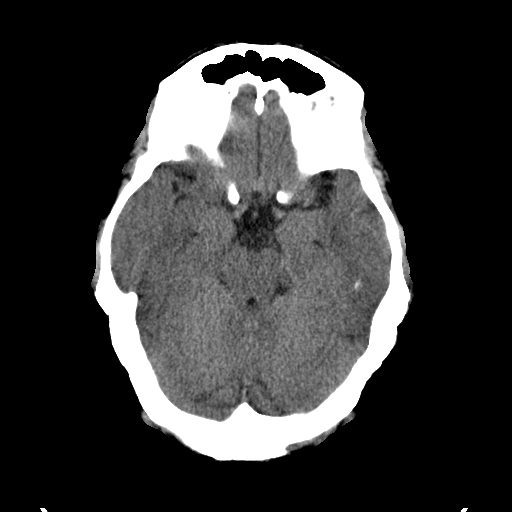
[im 11/30  brain]
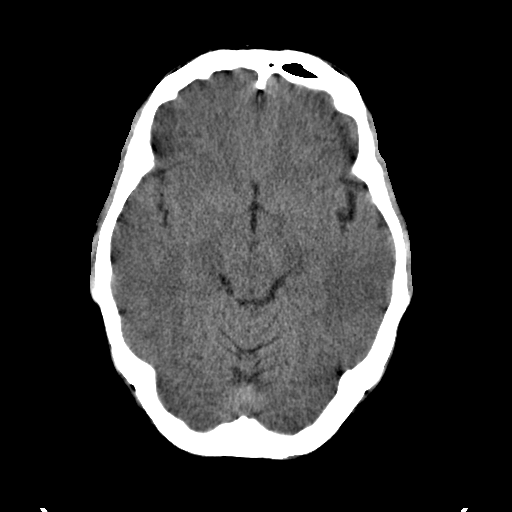
[im 11/30  bone]
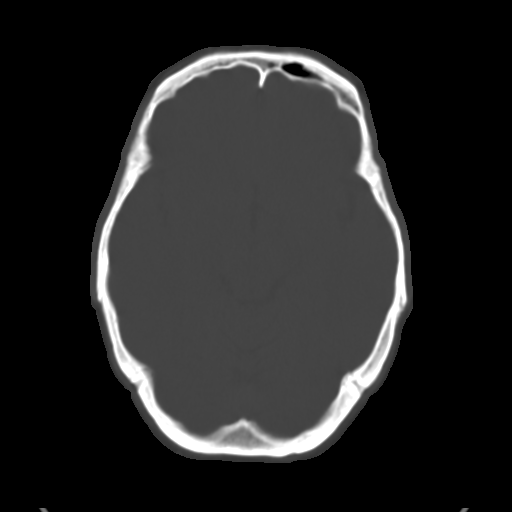
[im 13/30  brain]
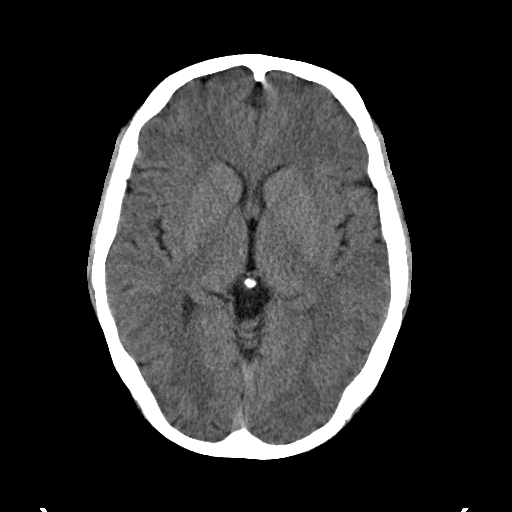
[im 15/30  brain]
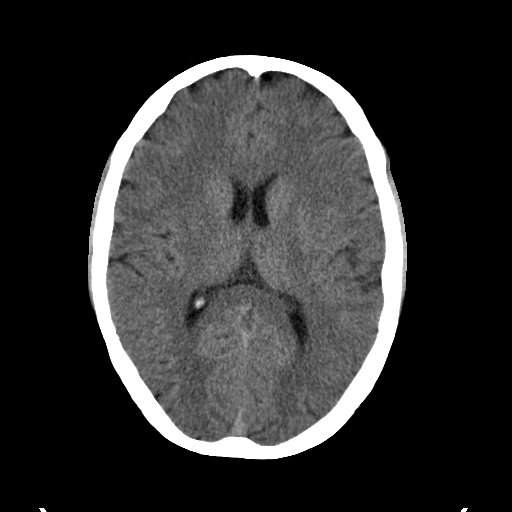
[im 17/30  brain]
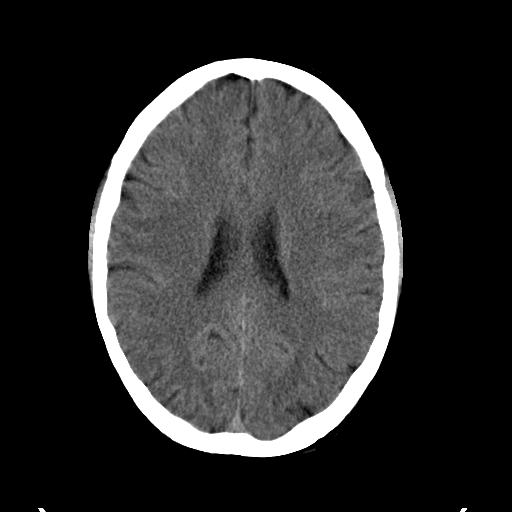
[im 19/30  brain]
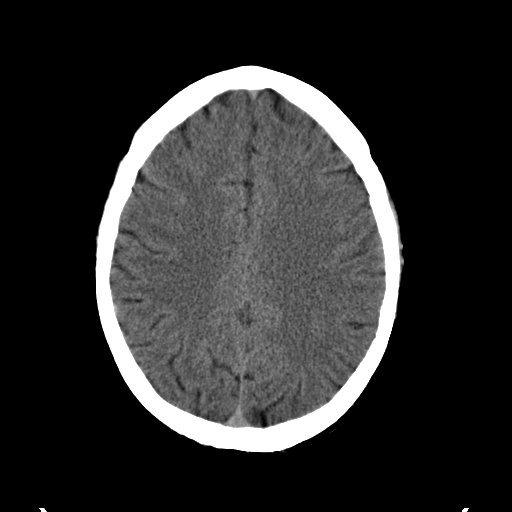
[im 19/30  bone]
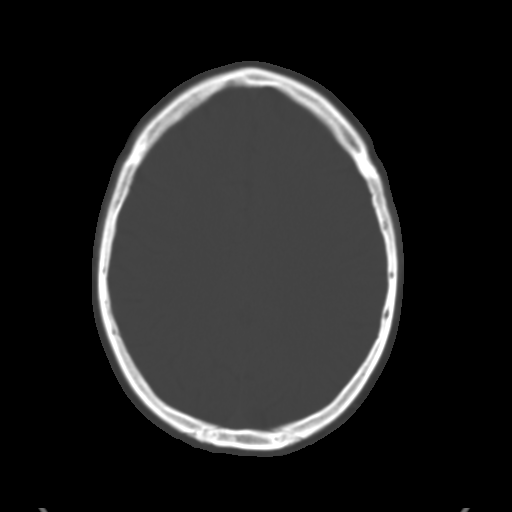
[im 21/30  brain]
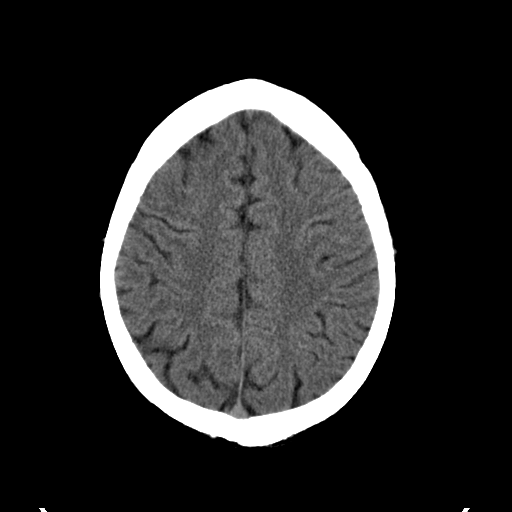
[im 23/30  brain]
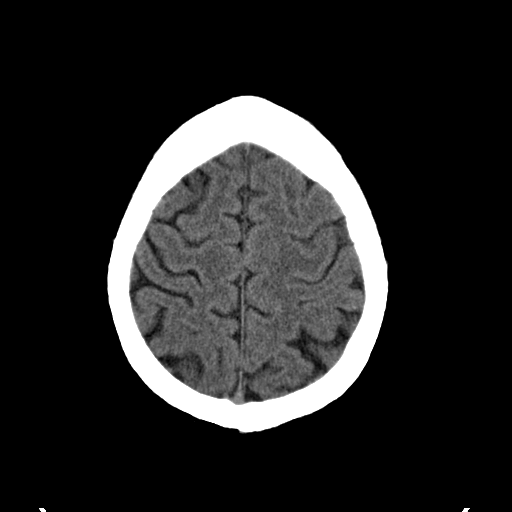
[im 25/30  brain]
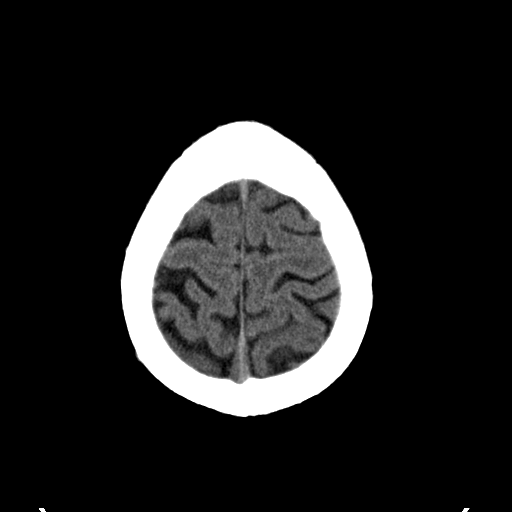
[im 27/30  brain]
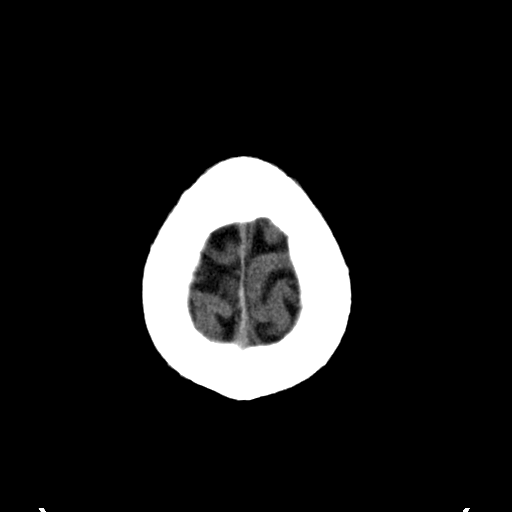
[im 27/30  bone]
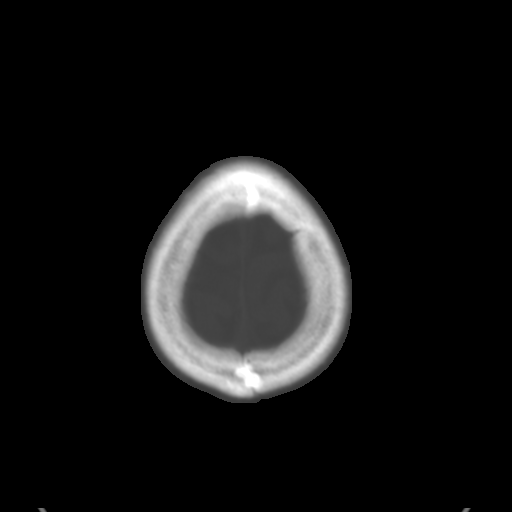

[Series 3: bone · axial · 0.45mm/px · z∈[-151,-111]mm · 3 of 30 slices shown]
[im 3/30  bone]
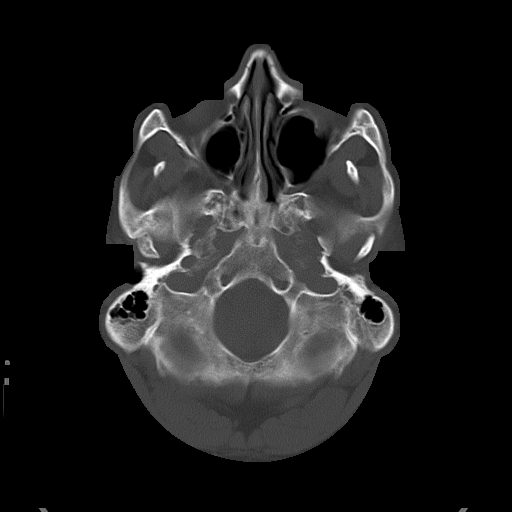
[im 7/30  bone]
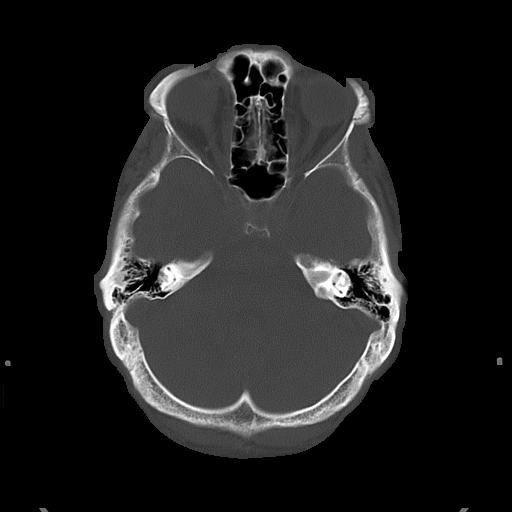
[im 11/30  bone]
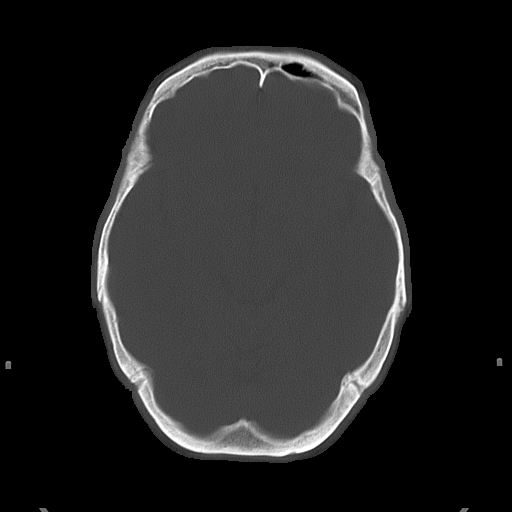

[16 of 30 positions shown; findings below may reference images not displayed]

PROCEDURE:     CT  - CT HEAD WITHOUT CONTRAST  - December 18, 2005 [DATE]

RESULT:        There is no evidence of intra-axial nor extra-axial fluid
collections nor evidence of acute hemorrhage. No secondary signs are
appreciated to suggest mass effect, subacute or chronic infarction.  The
visualized bony skeleton evaluated with bone windowing demonstrates no
evidence of fracture or dislocation.
IMPRESSION: Unremarkable head CT as described above.

Dr. Zingin Baldayake of the Emergency Department was faxed a preliminary report
on 12/18/05 at [DATE] p.m. EST.

## 2008-01-25 IMAGING — CT CT CHEST W/ CM
2 series · 16 of 32 positions shown, 20 images · IV contrast (APPLIED)
Comparison: none

REASON FOR EXAM: pe protcol
COMMENTS:

[Series 4: soft tissue · axial · 0.74mm/px · z∈[-991,-949]mm · 2 of 91 slices shown]
[im 7/91  mediastinal]
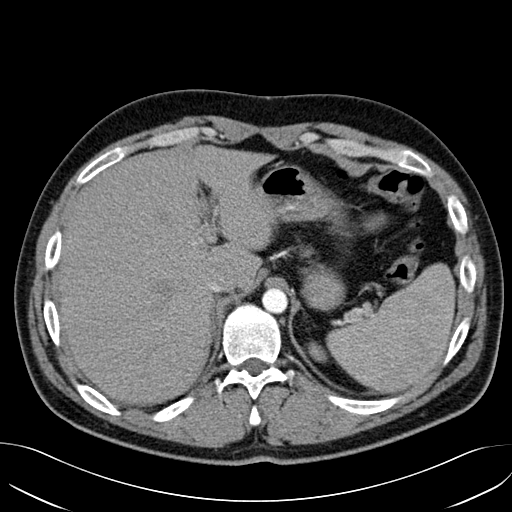
[im 21/91  mediastinal]
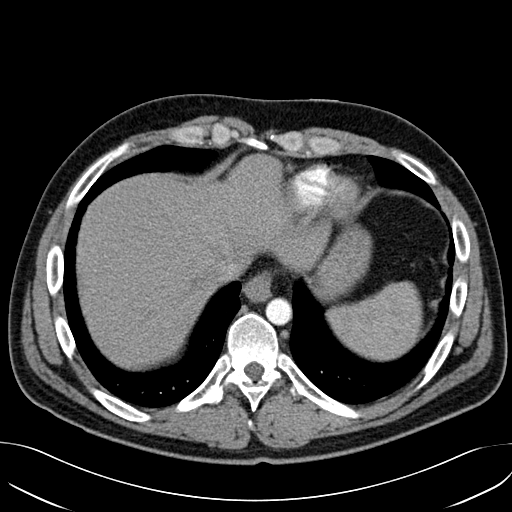

[Series 5: lung windows · axial · 0.74mm/px · z∈[-982,-760]mm · 14 of 88 slices shown, 18 images]
[im 7/88  mediastinal]
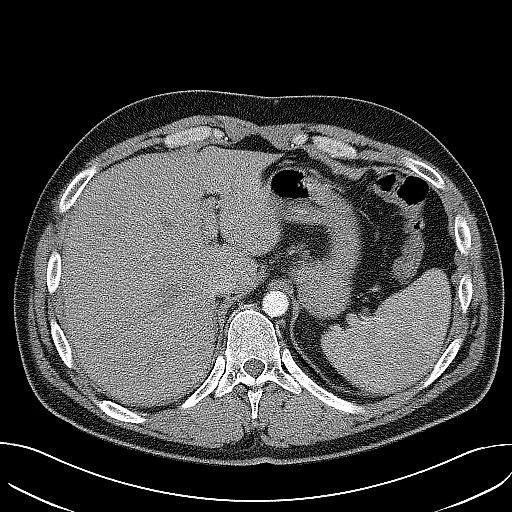
[im 7/88  lung]
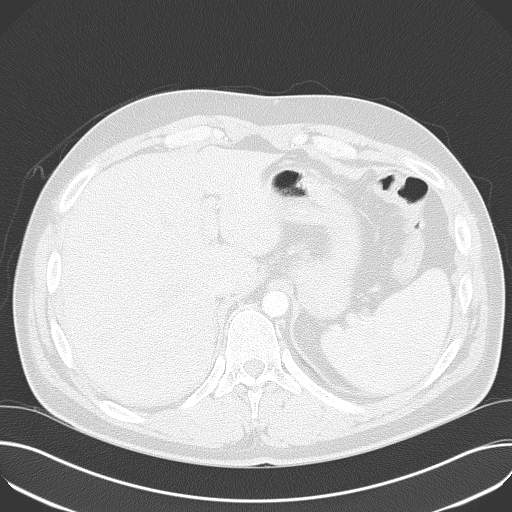
[im 14/88  lung]
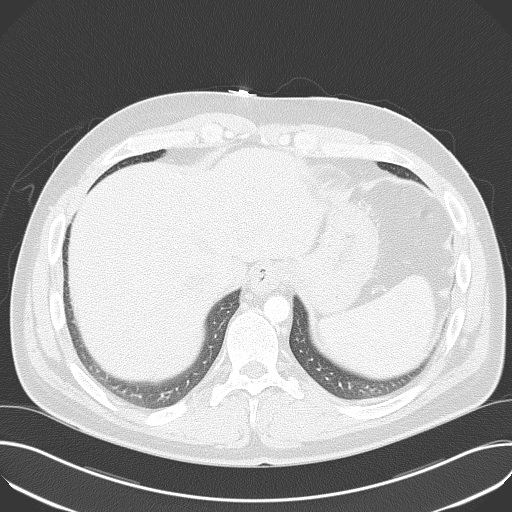
[im 21/88  lung]
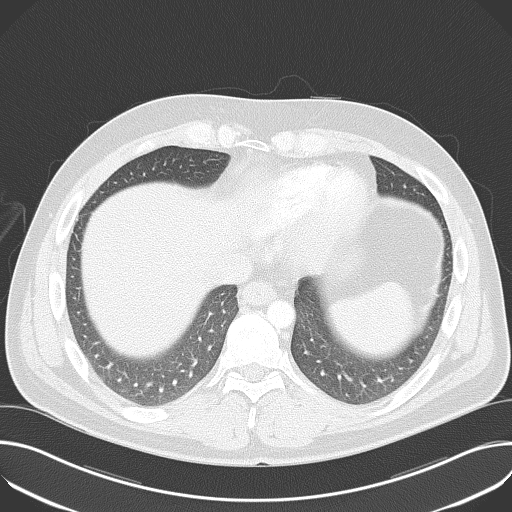
[im 27/88  lung]
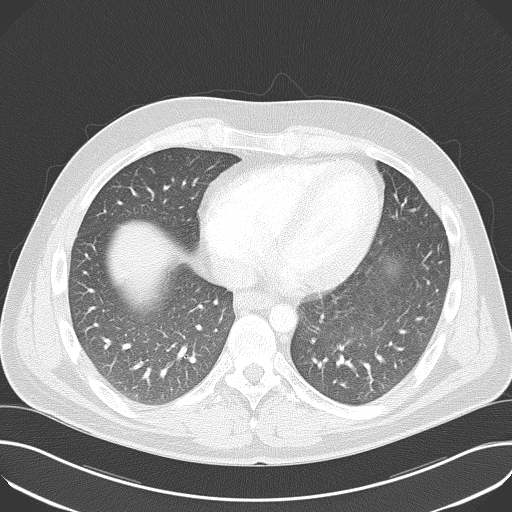
[im 34/88  mediastinal]
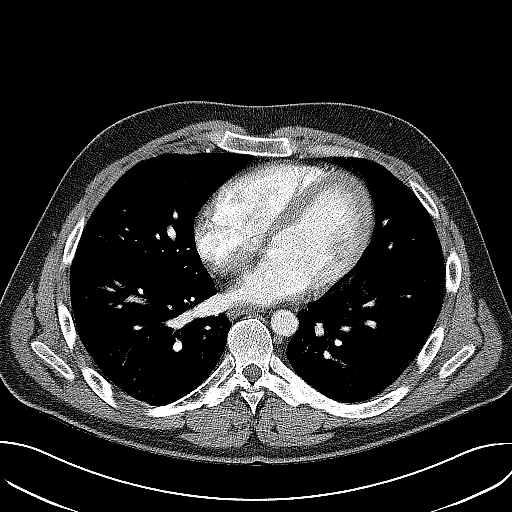
[im 34/88  lung]
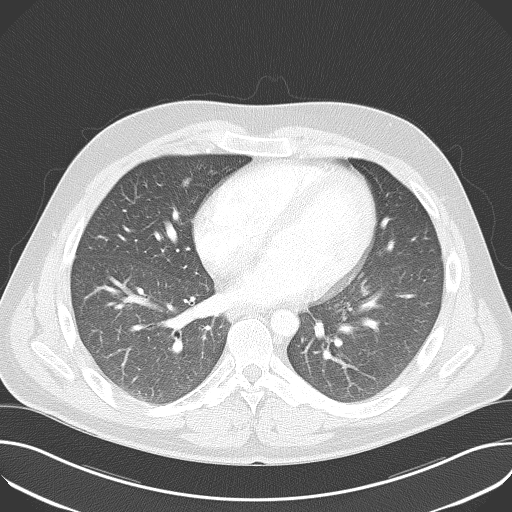
[im 40/88  lung]
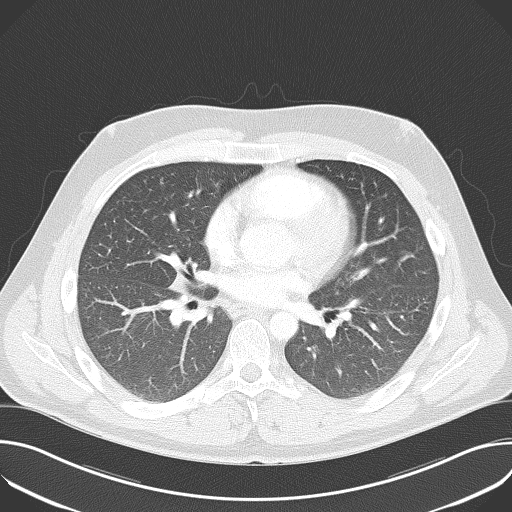
[im 41/88  lung]
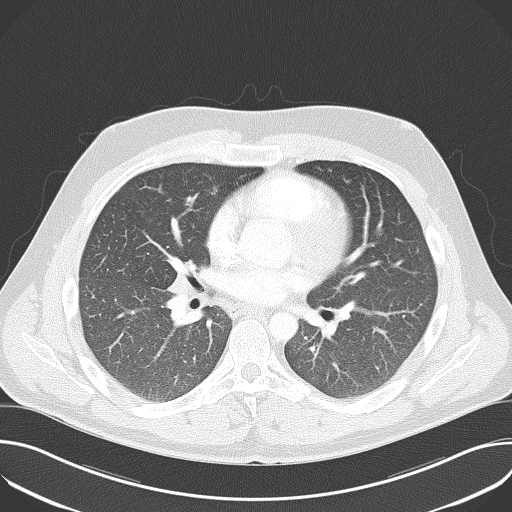
[im 44/88  lung]
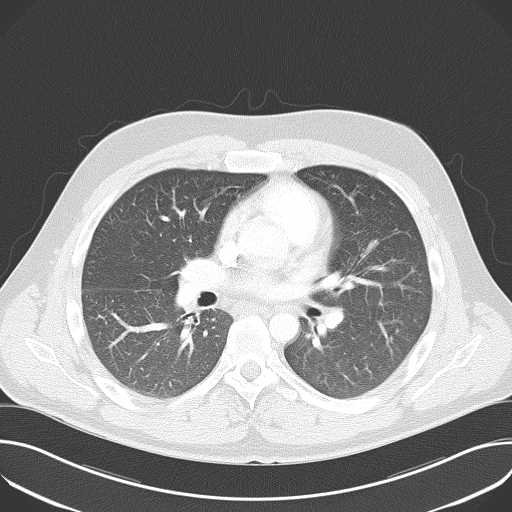
[im 47/88  mediastinal]
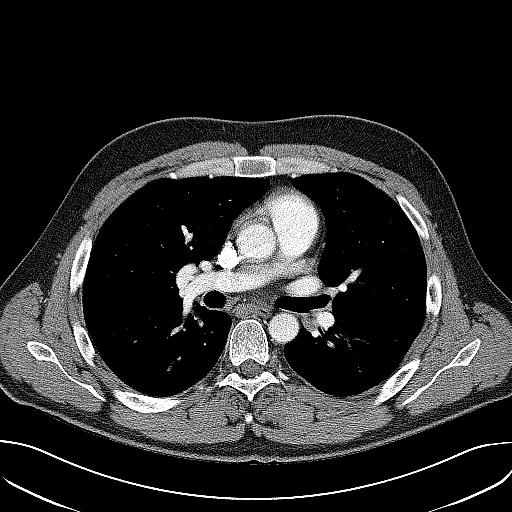
[im 47/88  lung]
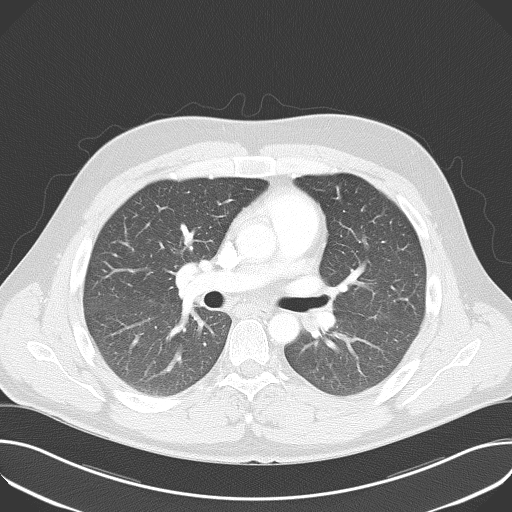
[im 54/88  lung]
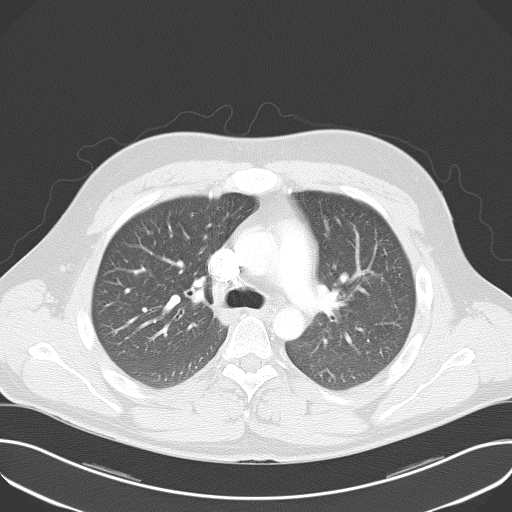
[im 61/88  lung]
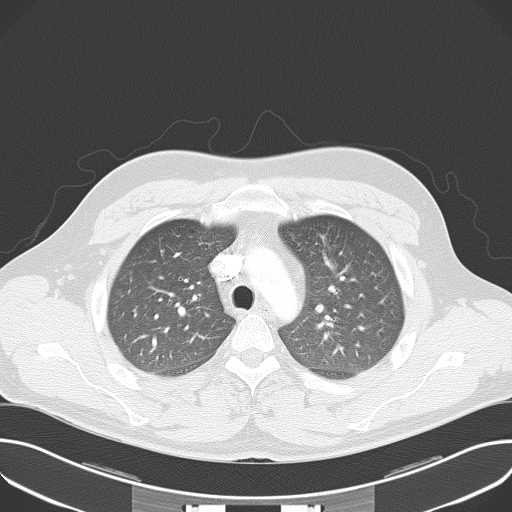
[im 67/88  lung]
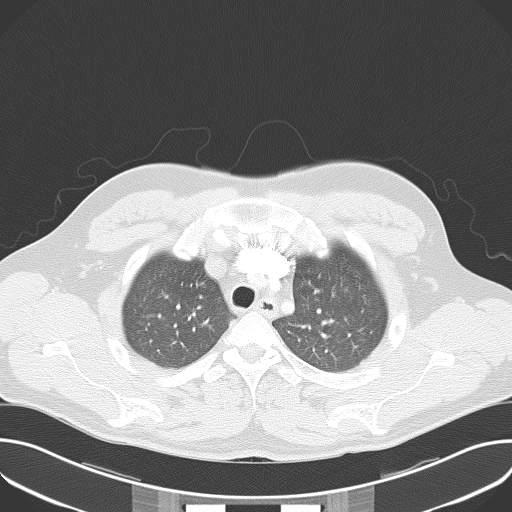
[im 74/88  mediastinal]
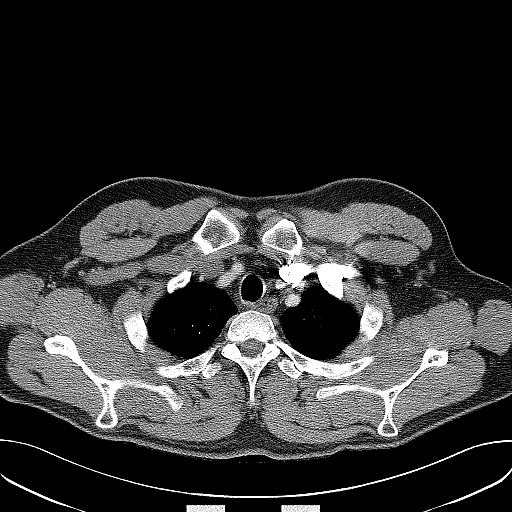
[im 74/88  lung]
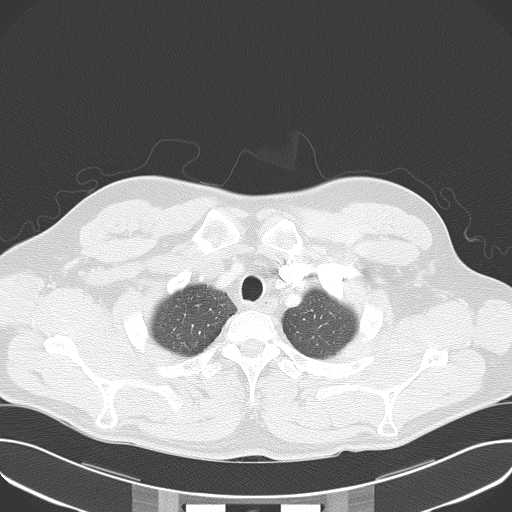
[im 81/88  lung]
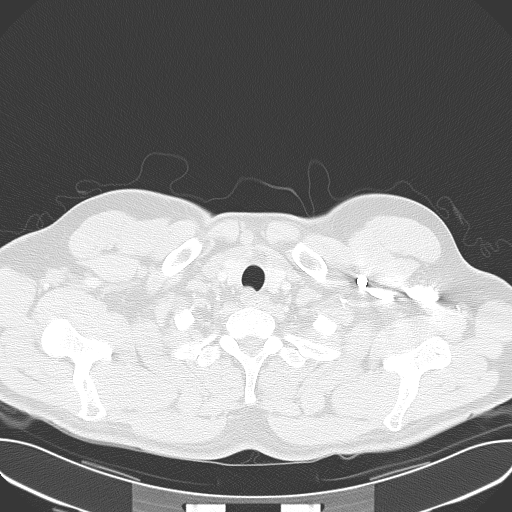

[16 of 32 positions shown; findings below may reference images not displayed]

PROCEDURE:     CT  - CT CHEST (FOR PE) W  - December 20, 2005  [DATE]

RESULT:     Axial 3 mm sections were obtained from the thoracic inlet
through the lung bases status post intravenous administration of 100 ml of
Isovue 370.

Evaluation of the mediastinum and hilar regions and structures demonstrate
no evidence of mediastinal nor hilar adenopathy.  There is no evidence of
filling defects within the main lobar or segmental pulmonary arteries to
suggest the sequela of a pulmonary embolus.

Evaluation of the lung parenchyma demonstrates no evidence of focal
infiltrates, effusions, edema, masses nor nodules.

The visualized upper abdominal viscera demonstrate no gross abnormalities.
IMPRESSION: 1)No evidence of pulmonary embolus.

2)A preliminary report was faxed to Dr. Juandro of the Emergency Department
on 12-20-05 at [DATE] Eastern Standard Time.

## 2008-01-25 IMAGING — CT CT HEAD WITHOUT CONTRAST
2 series · 16 of 30 positions shown, 20 images · non-contrast
Comparison: none

REASON FOR EXAM: syncope episode
COMMENTS:

[Series 2: without · axial · non-contrast · 0.38mm/px · z∈[-612,-492]mm · 13 of 30 slices shown, 17 images]
[im 3/30  brain]
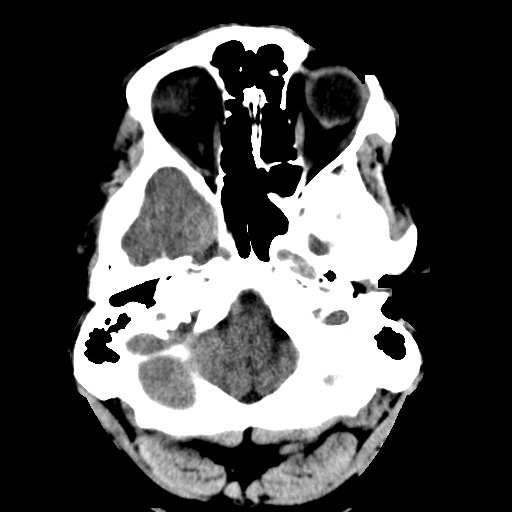
[im 3/30  bone]
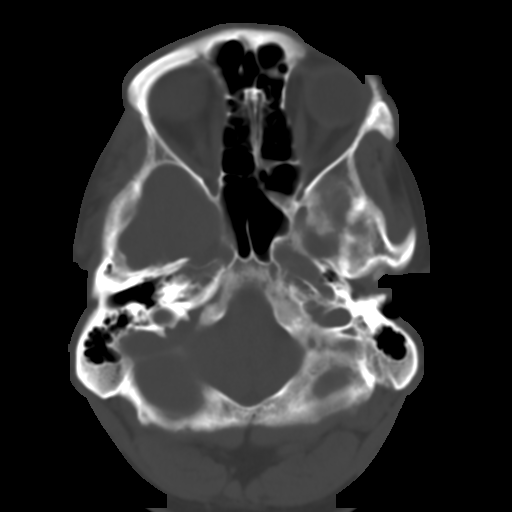
[im 5/30  brain]
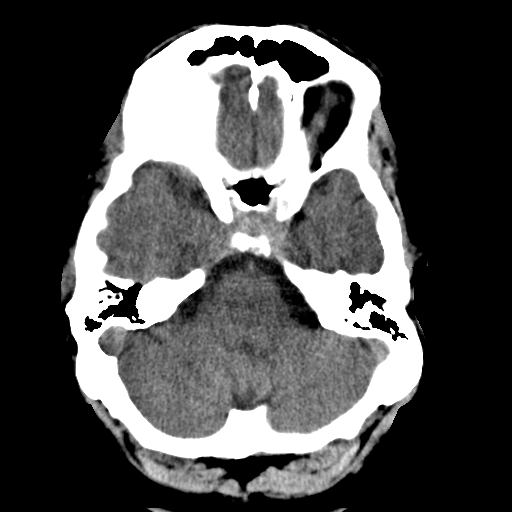
[im 7/30  brain]
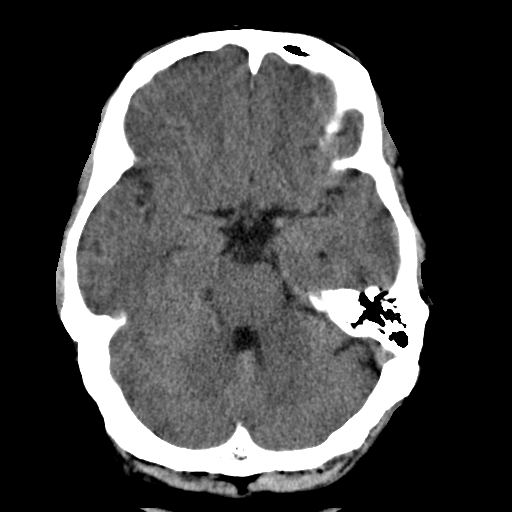
[im 9/30  brain]
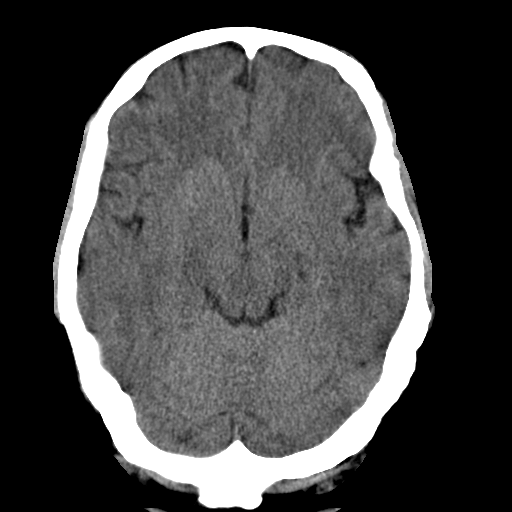
[im 11/30  brain]
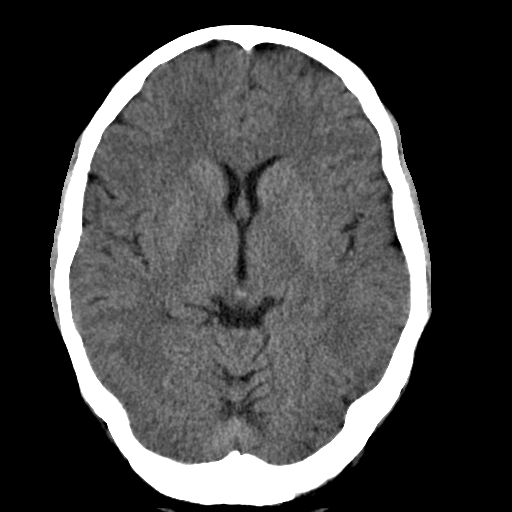
[im 11/30  bone]
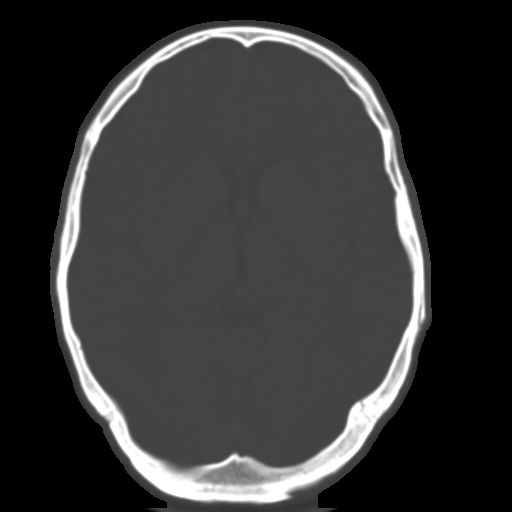
[im 13/30  brain]
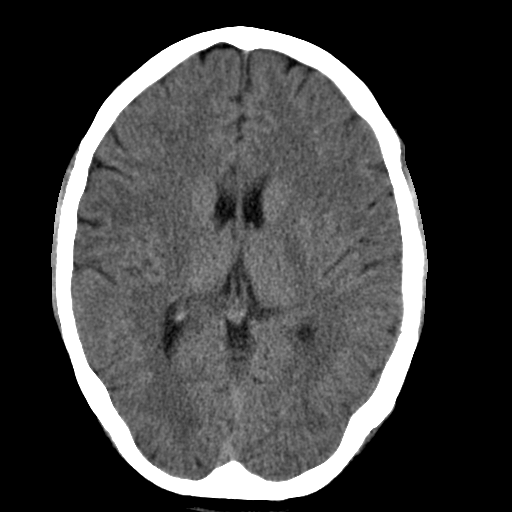
[im 15/30  brain]
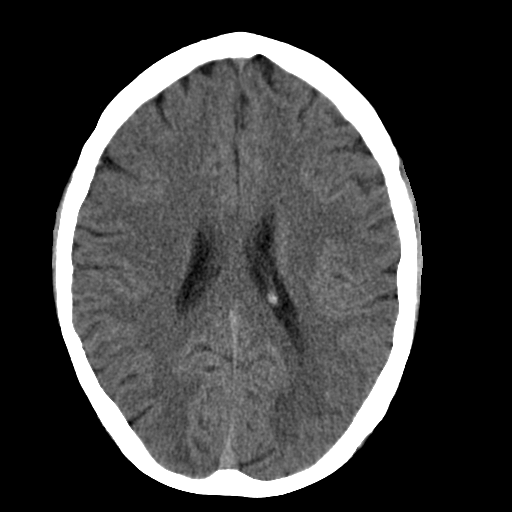
[im 17/30  brain]
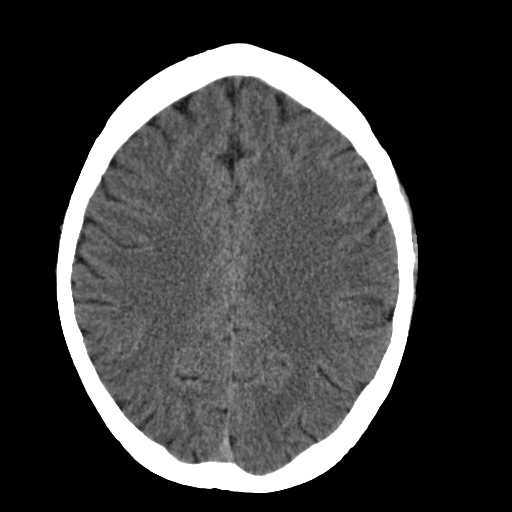
[im 19/30  brain]
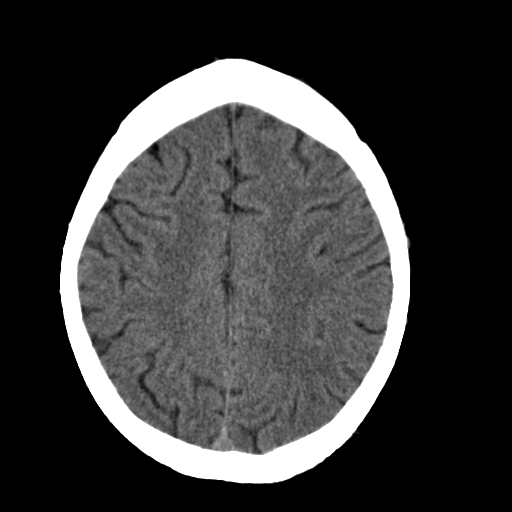
[im 19/30  bone]
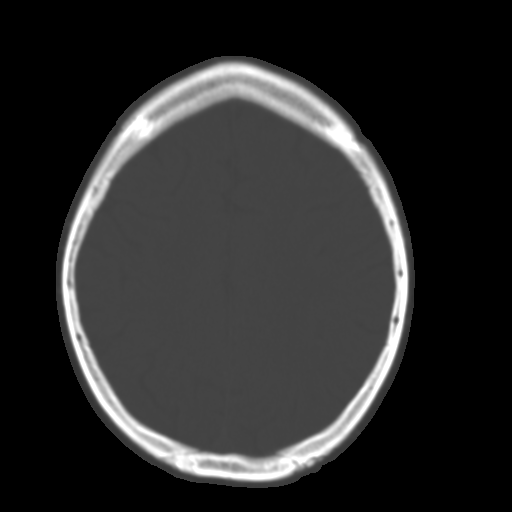
[im 21/30  brain]
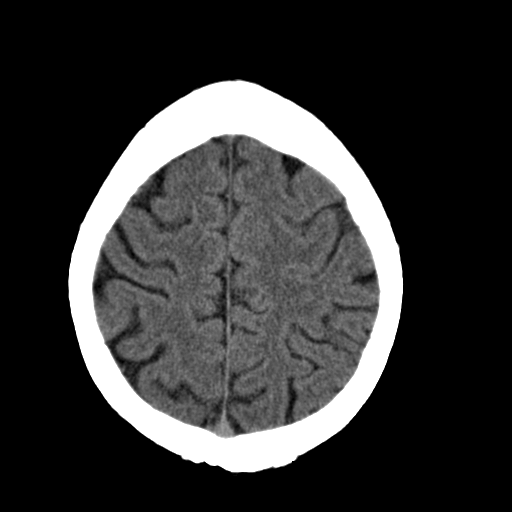
[im 23/30  brain]
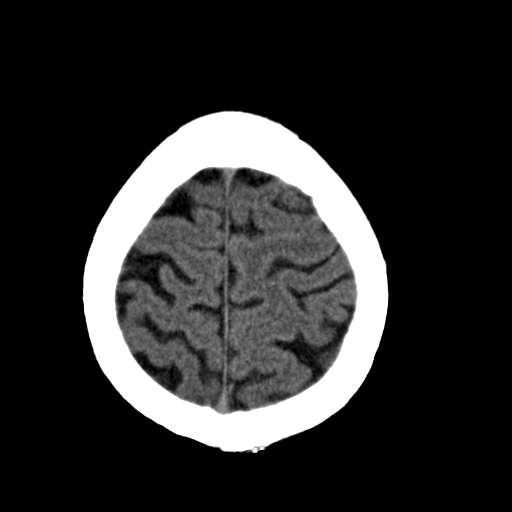
[im 25/30  brain]
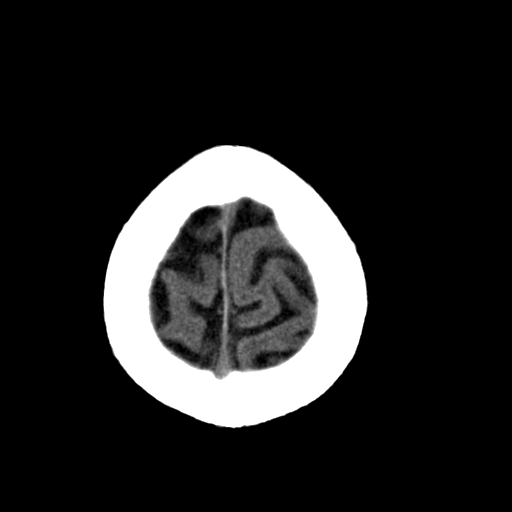
[im 27/30  brain]
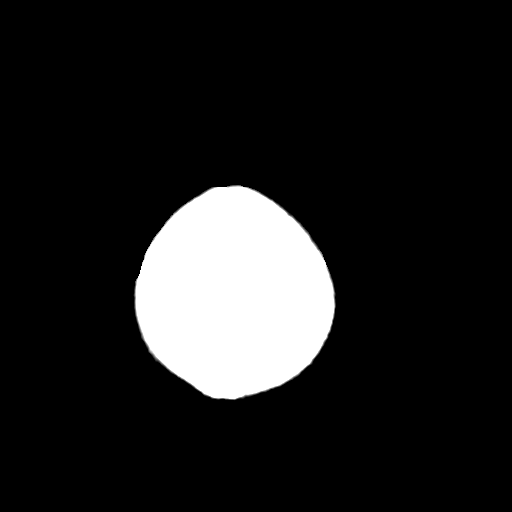
[im 27/30  bone]
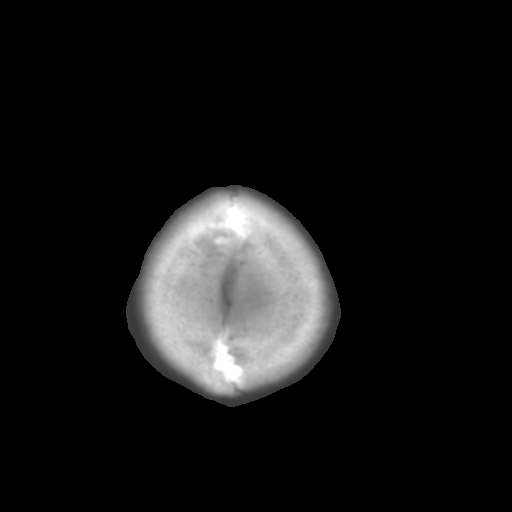

[Series 3: bone · axial · 0.38mm/px · z∈[-612,-572]mm · 3 of 30 slices shown]
[im 3/30  bone]
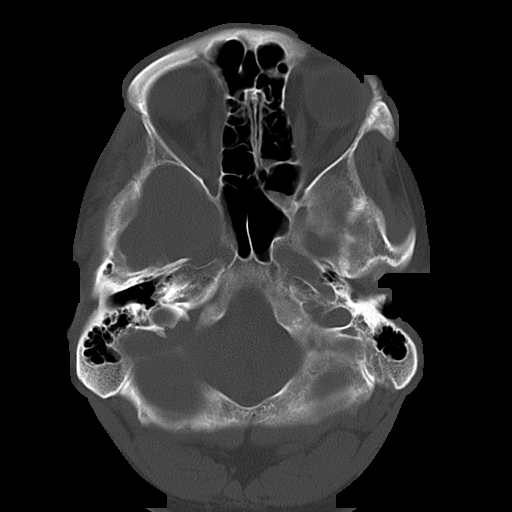
[im 7/30  bone]
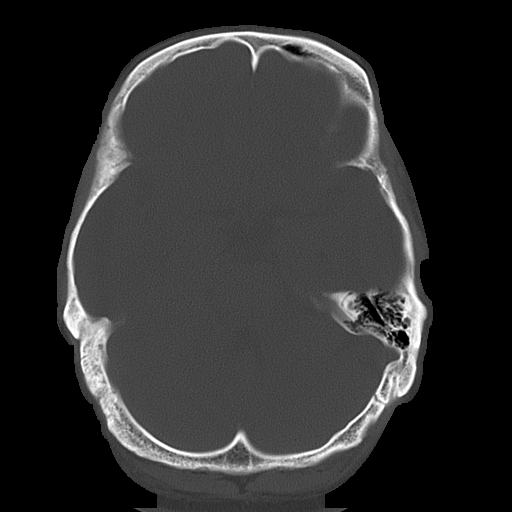
[im 11/30  bone]
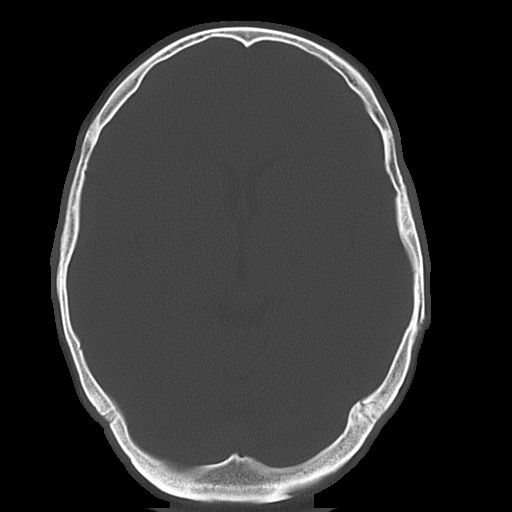

[16 of 30 positions shown; findings below may reference images not displayed]

PROCEDURE:     CT  - CT HEAD WITHOUT CONTRAST  - December 20, 2005  [DATE]

RESULT:     There appears to be evidence of mild ethmoid sinus disease on
the LEFT. There is no evidence of intra-axial nor extra-axial fluid
collections nor evidence of acute hemorrhage. No secondary signs are
appreciated to suggest mass effect, subacute or chronic infarction.
Evaluation of the visualized bony skeleton demonstrates no evidence of
fracture or dislocation.
IMPRESSION: 1)Unremarkable Head CT as described above.

A preliminary report was faxed to Dr. Tavares of the Emergency Department on
12-20-05 at [DATE] Eastern Standard Time.

## 2008-01-25 IMAGING — CR DG CHEST 2V
1 series · 2 of 2 positions shown · non-contrast
Comparison: none

REASON FOR EXAM: chest pain
COMMENTS:

PROCEDURE:     DXR - DXR CHEST PA (OR AP) AND LATERAL  - December 20, 2005  [DATE]
RESULT:     The lungs are clear. The heart and pulmonary vessels are normal.
The bony and mediastinal structures are unremarkable. There is no
significant change when compared to the prior exam dated 12-18-05.

[Series 1: view not recorded · 0.17mm/px · 2 of 2 slices shown]
[im 1/2]
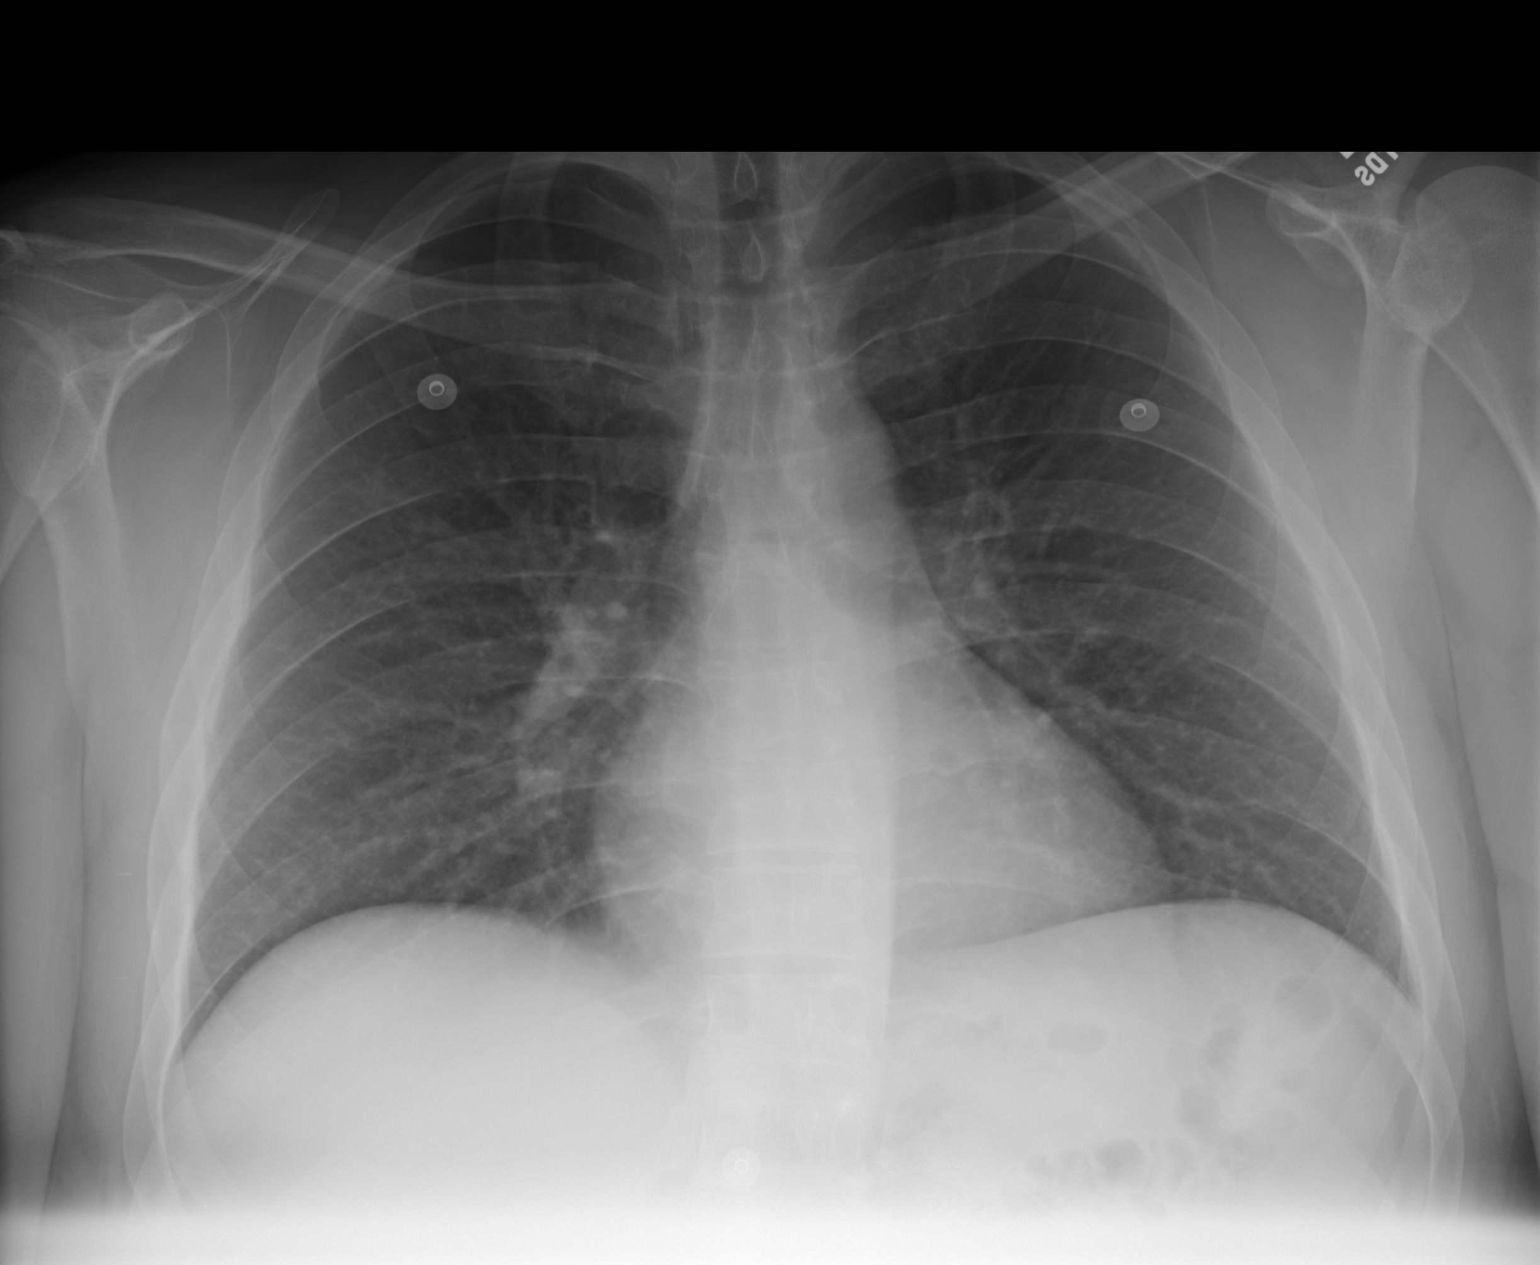
[im 2/2]
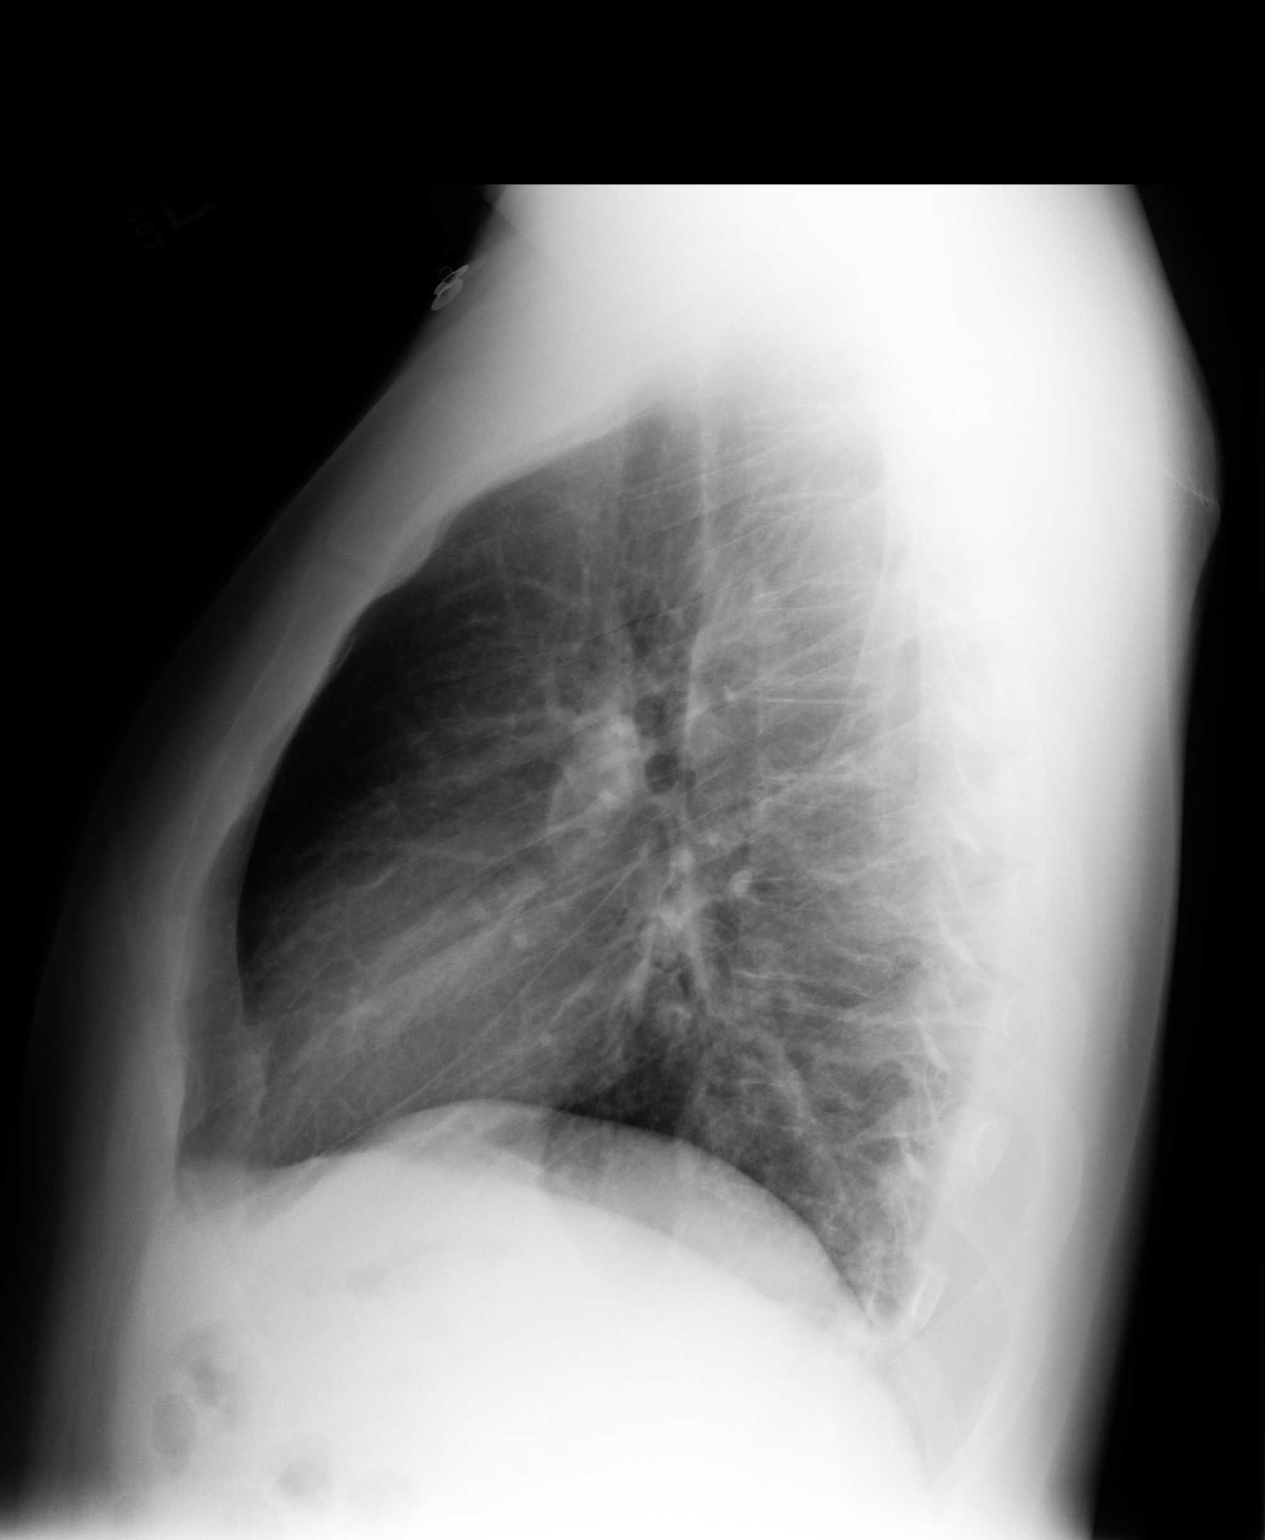

[2 of 2 positions shown; findings below may reference images not displayed]

IMPRESSION: 1)No acute cardiopulmonary disease evident.

## 2008-03-07 IMAGING — CT CT HEAD WITHOUT CONTRAST
3 of 4 series · 17 of 30 positions shown, 20 images · non-contrast
Comparison: none

REASON FOR EXAM: weakness  left sided   ams   [HOSPITAL]
COMMENTS:

PROCEDURE:     CT  - CT HEAD WITHOUT CONTRAST  - January 31, 2006  [DATE]
RESULT:     There is no evidence of intraaxial nor extraaxial fluid
collections.  No evidence of acute hemorrhage is identified.
The visualized bony skeleton demonstrates no evidence of fracture nor
dislocation.

[Series 2: without · axial · non-contrast · 0.39mm/px · z∈[+726,+846]mm · 9 of 30 slices shown, 12 images (1 of 2)]
[im 3/30  brain]
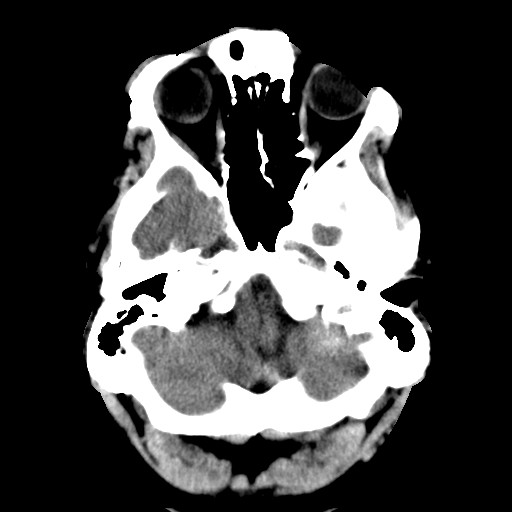
[im 3/30  bone]
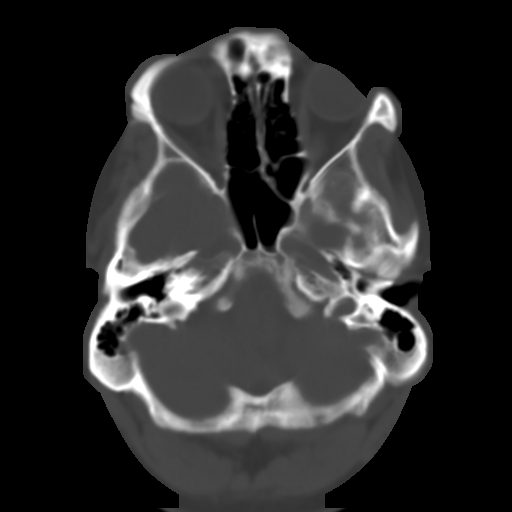
[im 6/30  brain]
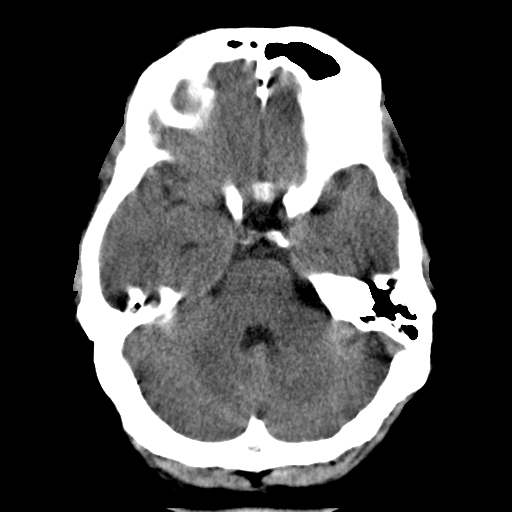
[im 9/30  brain]
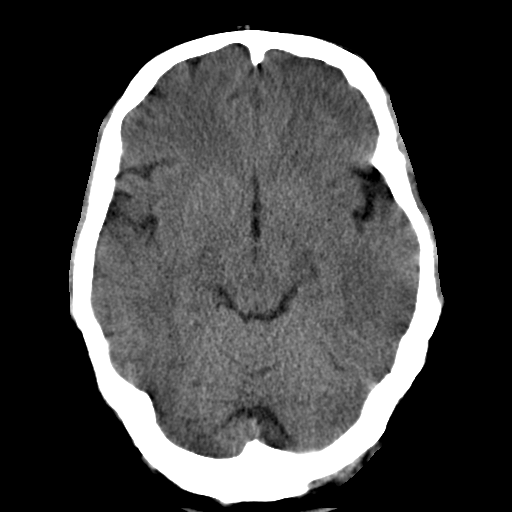
[im 12/30  brain]
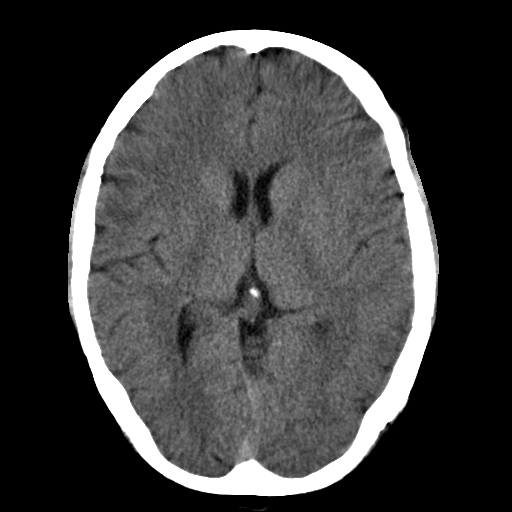
[im 15/30  brain]
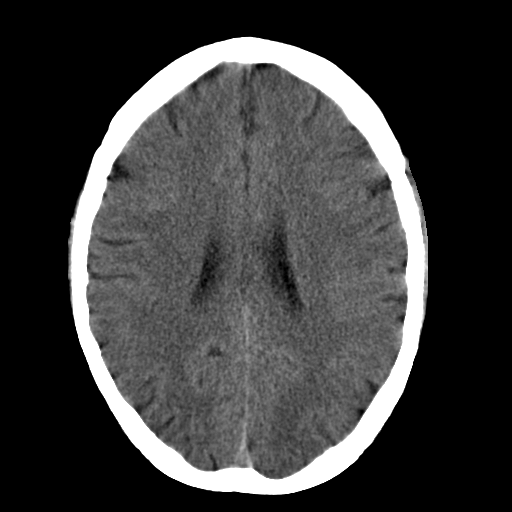
[im 15/30  bone]
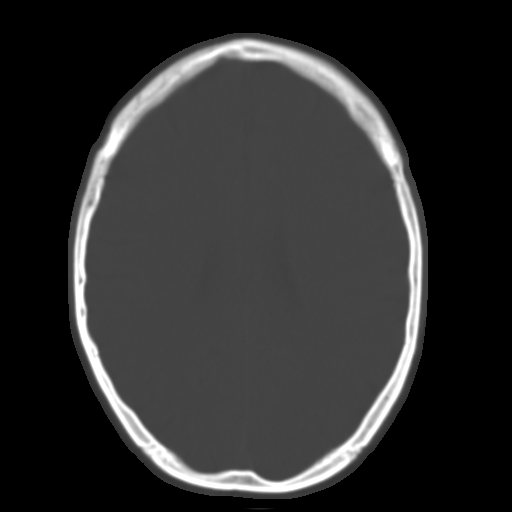
[im 18/30  brain]
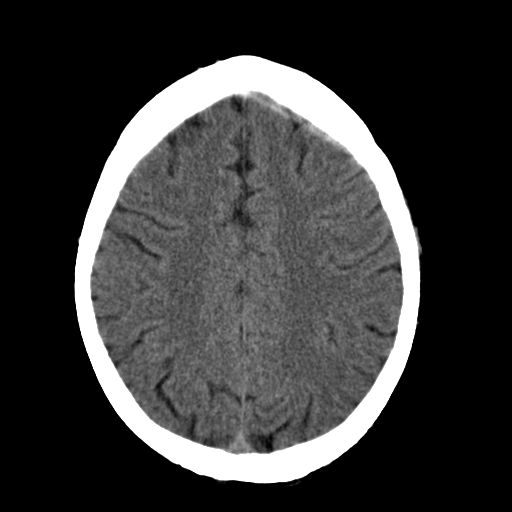
[im 21/30  brain]
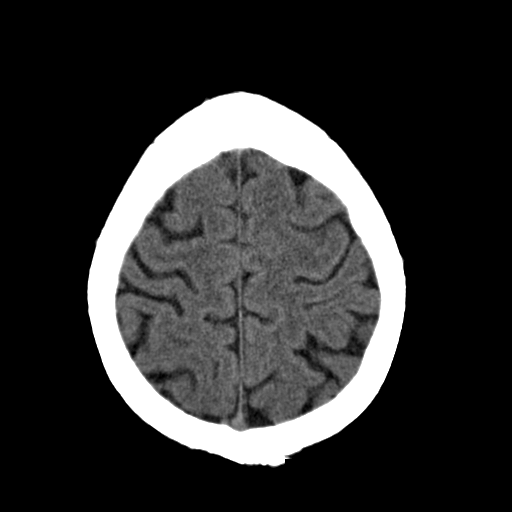
[im 24/30  brain]
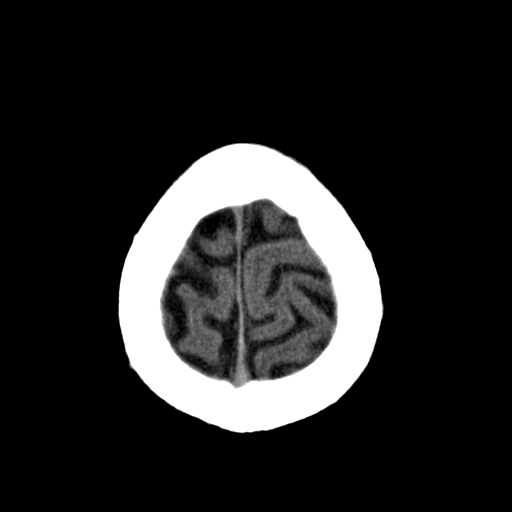
[im 27/30  brain]
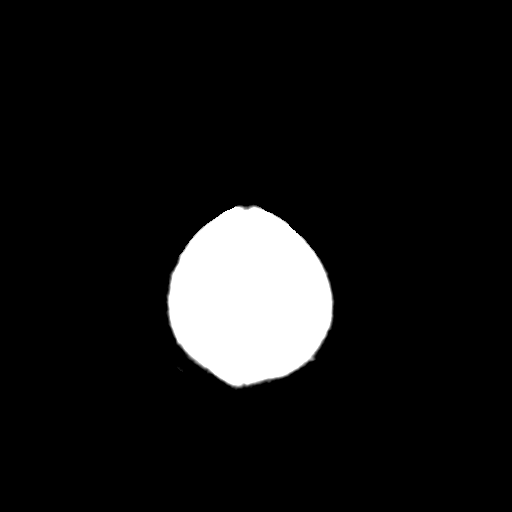
[im 27/30  bone]
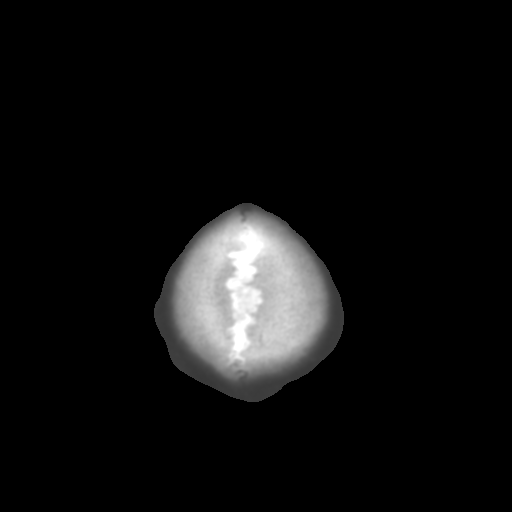

[Series 3: bone · axial · 0.39mm/px · z∈[+726,+816]mm · 6 of 30 slices shown]
[im 3/30  bone]
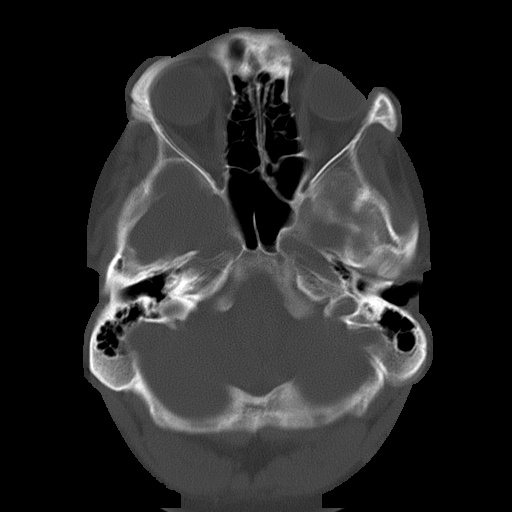
[im 6/30  bone]
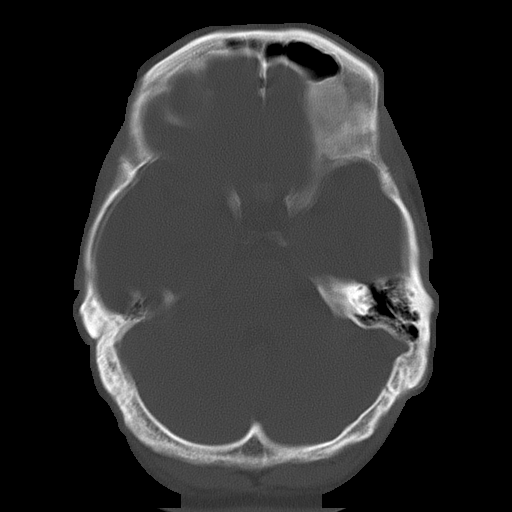
[im 9/30  bone]
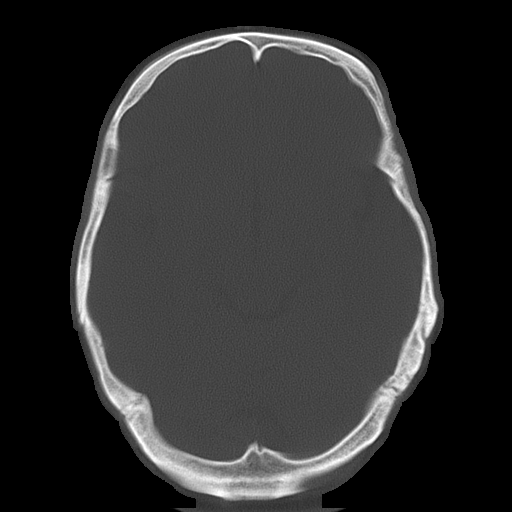
[im 12/30  bone]
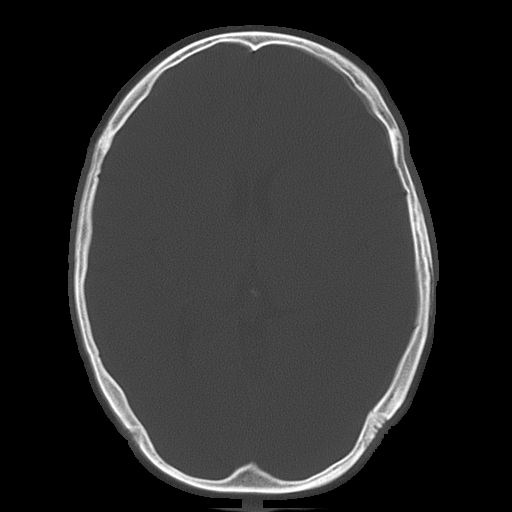
[im 18/30  bone]
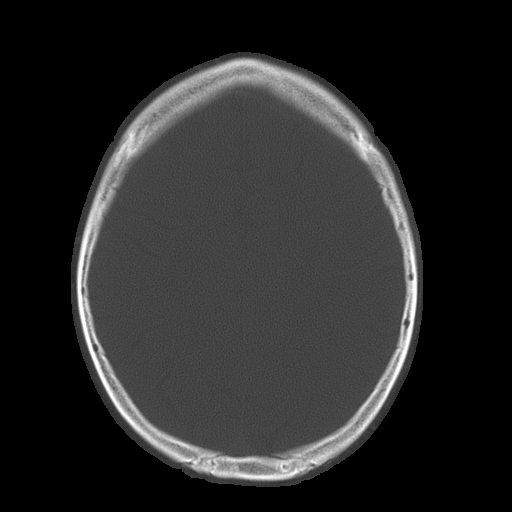
[im 21/30  bone]
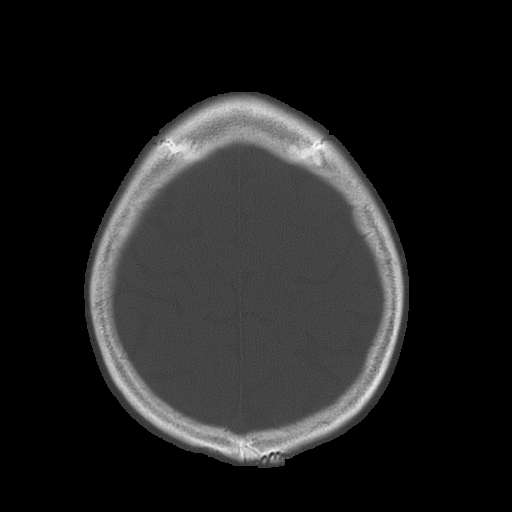

[Series 4: without · axial · non-contrast · 0.39mm/px · z∈[+731,+746]mm · 2 of 11 slices shown (2 of 2)]
[im 4/11  brain]
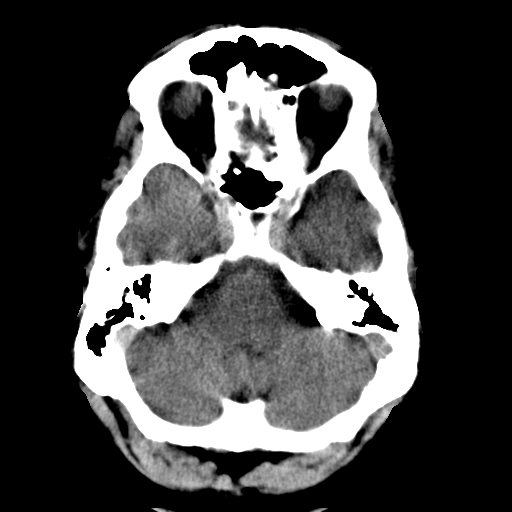
[im 7/11  brain]
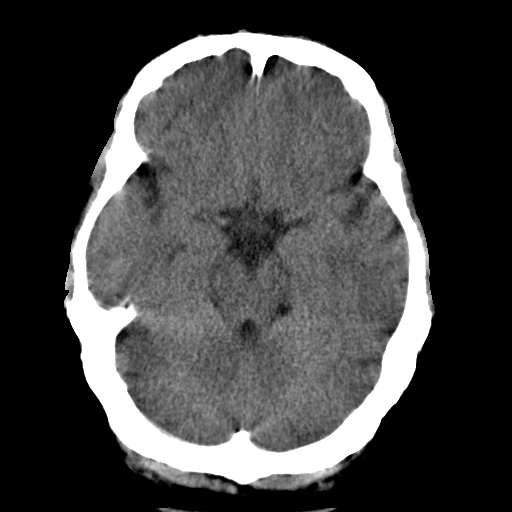

[17 of 30 positions shown; findings below may reference images not displayed]

IMPRESSION: 1.     Unremarkable emergent head CT, as described above.
2.     Dr. Lorraine of the emergency department was informed of these findings
at the time of the initial interpretation.

## 2008-03-15 IMAGING — CT CT HEAD WITHOUT CONTRAST
2 series · 16 of 30 positions shown, 20 images · non-contrast
Comparison: none

REASON FOR EXAM: Left side numbness      rm 16
COMMENTS:

PROCEDURE:     CT  - CT HEAD WITHOUT CONTRAST  - February 08, 2006  [DATE]
RESULT:
REASON FOR CONSULTATION:    LEFT sided numbness.

[Series 2: without · axial · non-contrast · 0.42mm/px · z∈[-150,-30]mm · 13 of 30 slices shown, 17 images]
[im 3/30  brain]
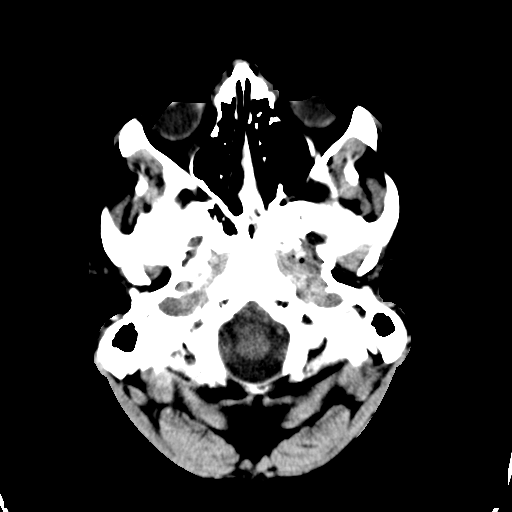
[im 3/30  bone]
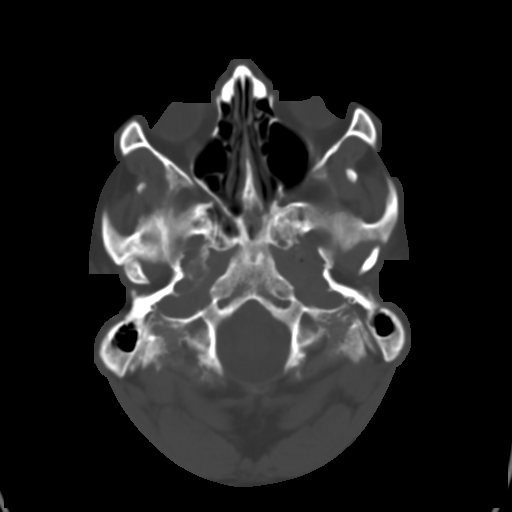
[im 5/30  brain]
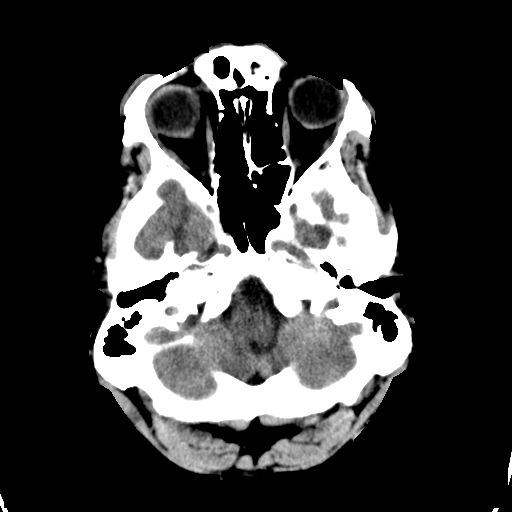
[im 7/30  brain]
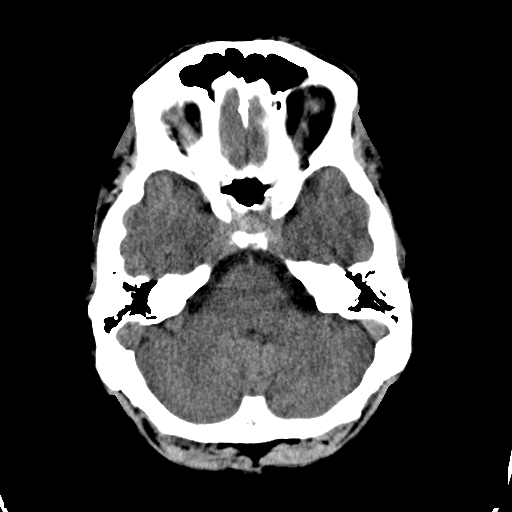
[im 9/30  brain]
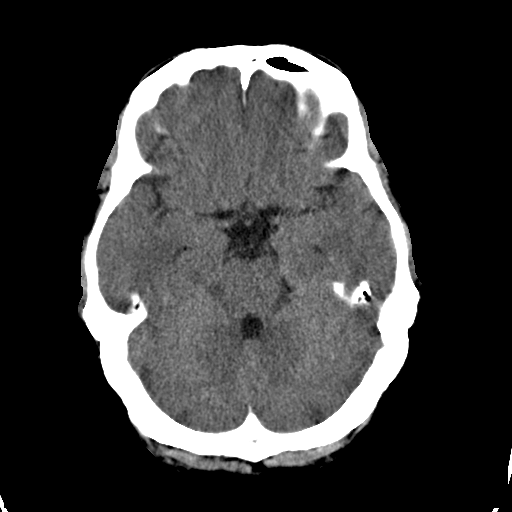
[im 11/30  brain]
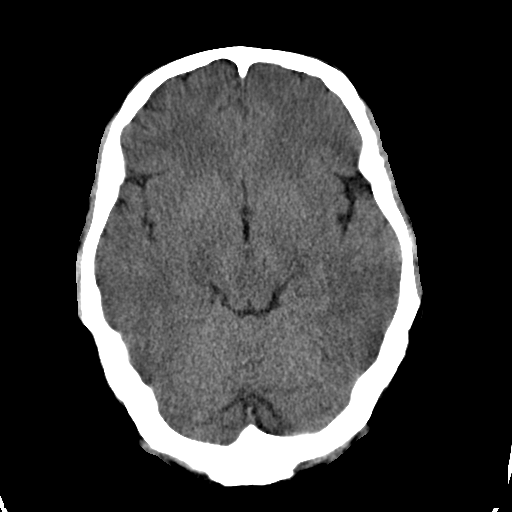
[im 11/30  bone]
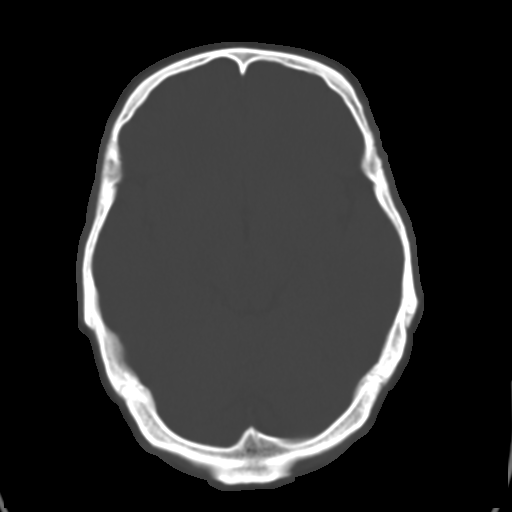
[im 13/30  brain]
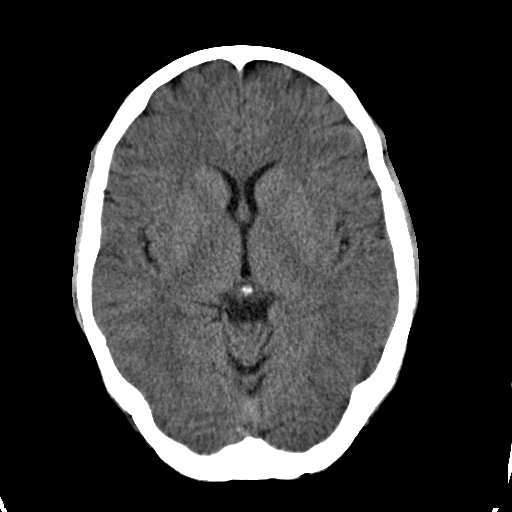
[im 15/30  brain]
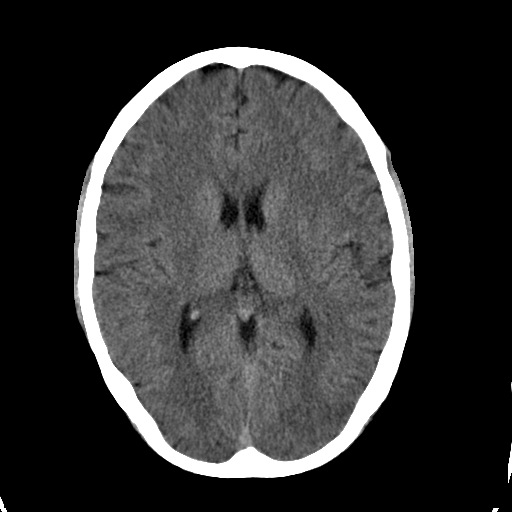
[im 17/30  brain]
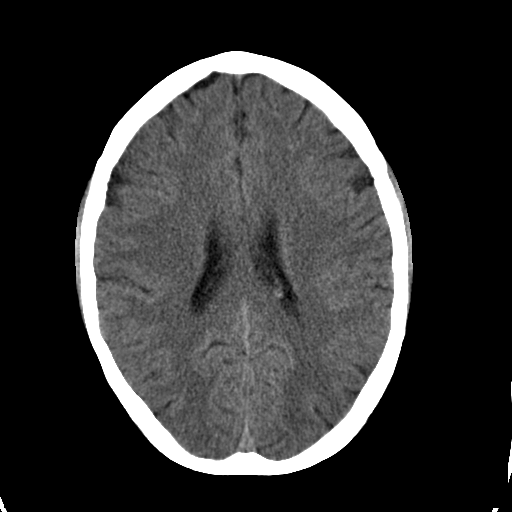
[im 19/30  brain]
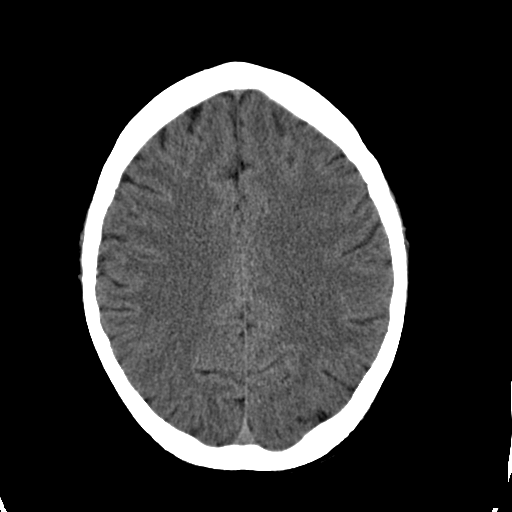
[im 19/30  bone]
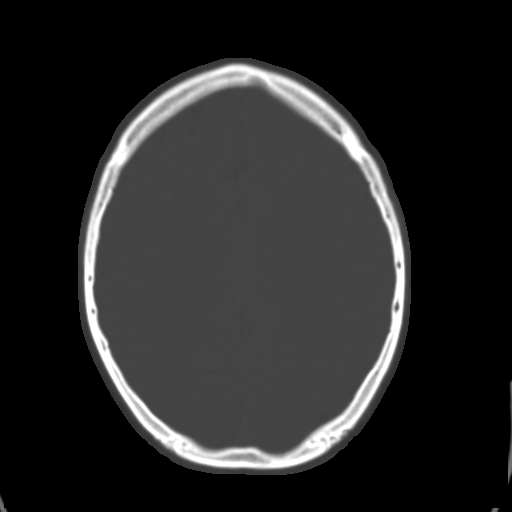
[im 21/30  brain]
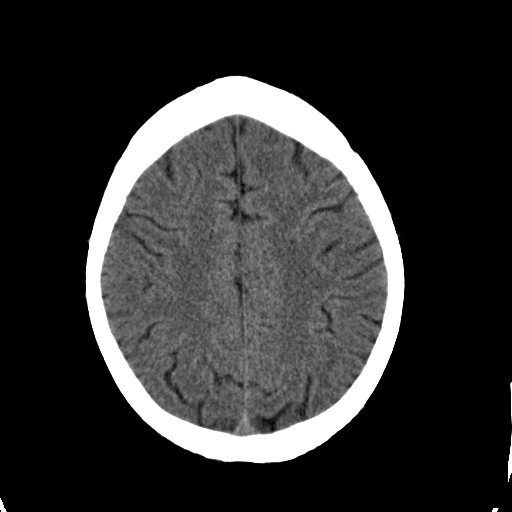
[im 23/30  brain]
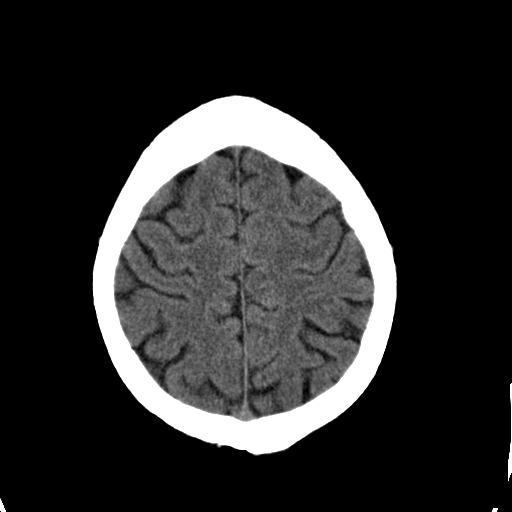
[im 25/30  brain]
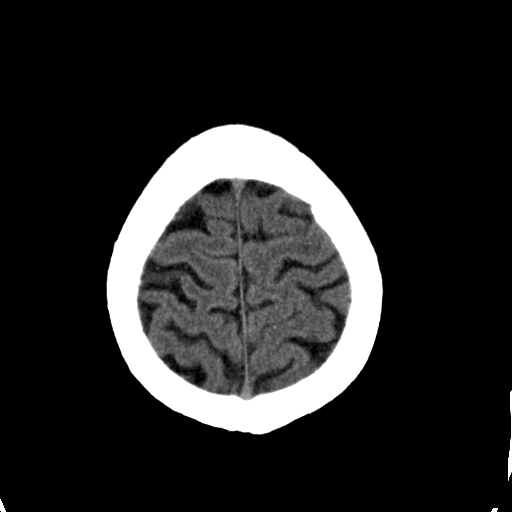
[im 27/30  brain]
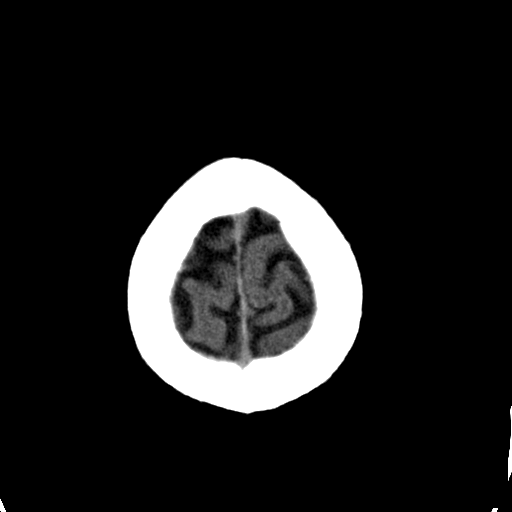
[im 27/30  bone]
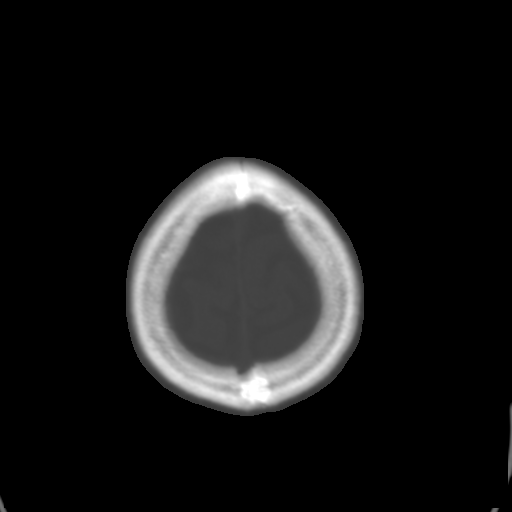

[Series 3: bone · axial · 0.42mm/px · z∈[-150,-110]mm · 3 of 30 slices shown]
[im 3/30  bone]
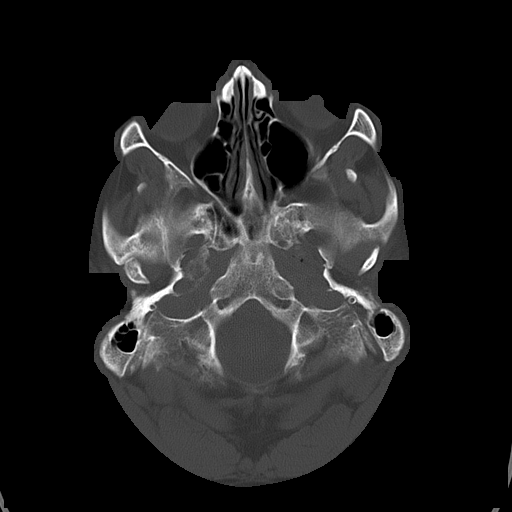
[im 7/30  bone]
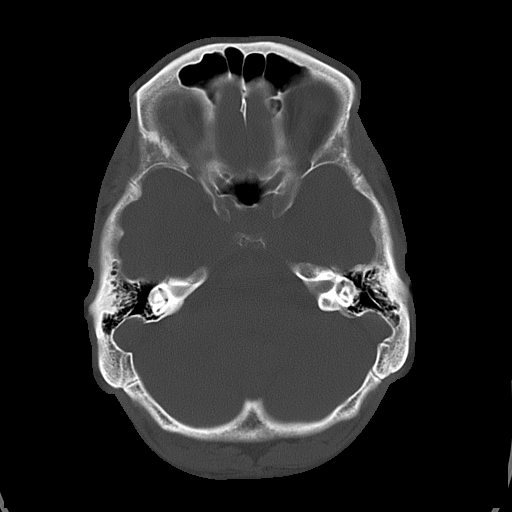
[im 11/30  bone]
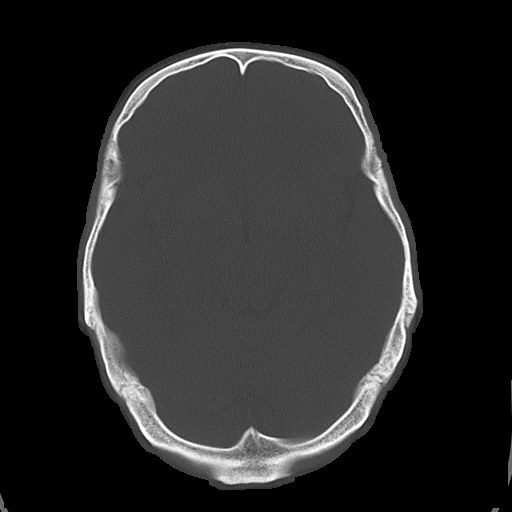

[16 of 30 positions shown; findings below may reference images not displayed]

FINDINGS: Emergent unchanged head CT was performed.  Faxed report was sent
to the Emergency Room.   No acute infarcts or bleeds are identified.  No
mass effect. No shift of the midline.  No extraaxial fluid collections.

On the bone window settings, the sinuses and mastoids appear clear.
IMPRESSION: No significant abnormalities identified on the unenhanced head CT.

## 2008-06-14 IMAGING — CR RIGHT HAND - COMPLETE 3+ VIEW
1 series · 4 of 4 positions shown · non-contrast
Comparison: none

REASON FOR EXAM: Injury
COMMENTS:

PROCEDURE:     DXR - DXR HAND RT COMPLETE W/OBLIQUES  - May 10, 2006  [DATE]
RESULT:     Three views of the RIGHT hand show persistent flexion at the PIP
joints of the fourth and fifth fingers. No acute fracture or dislocation is
seen.

[Series 1: view not recorded · 0.17mm/px · 4 of 4 slices shown]
[im 1/4]
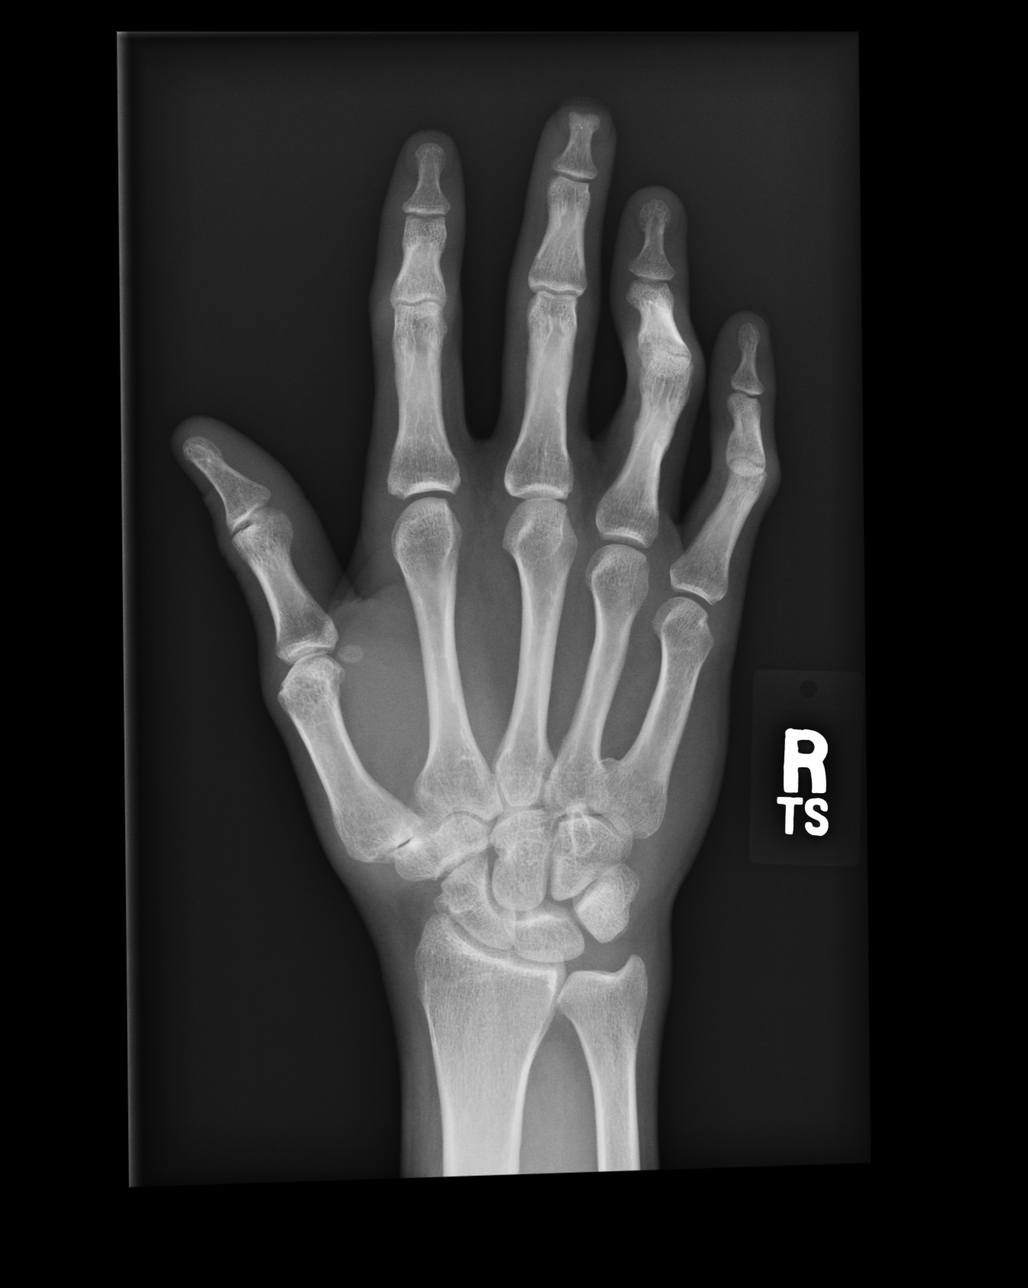
[im 2/4]
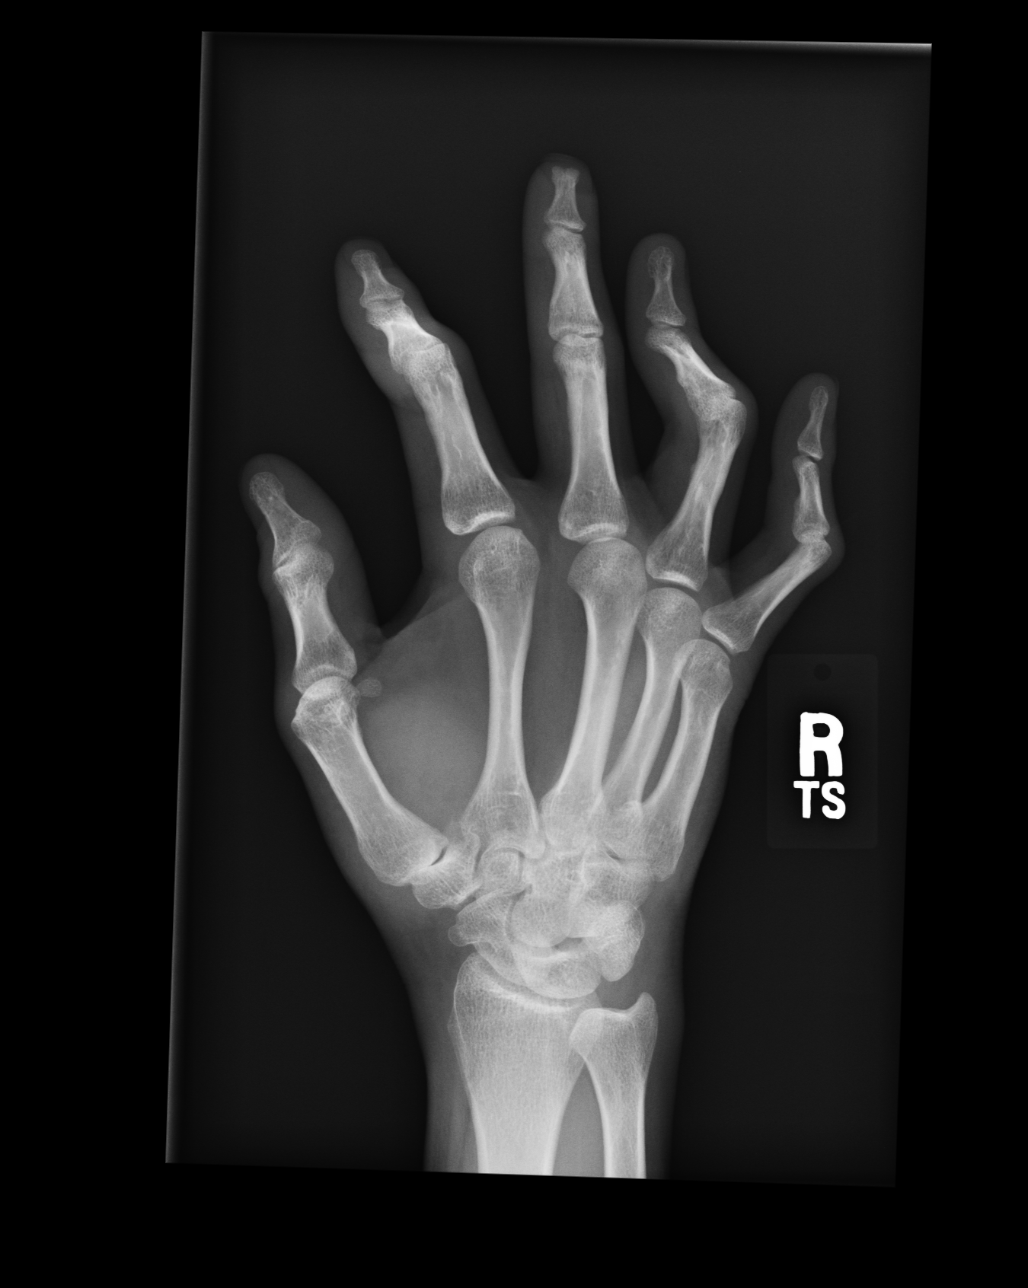
[im 3/4]
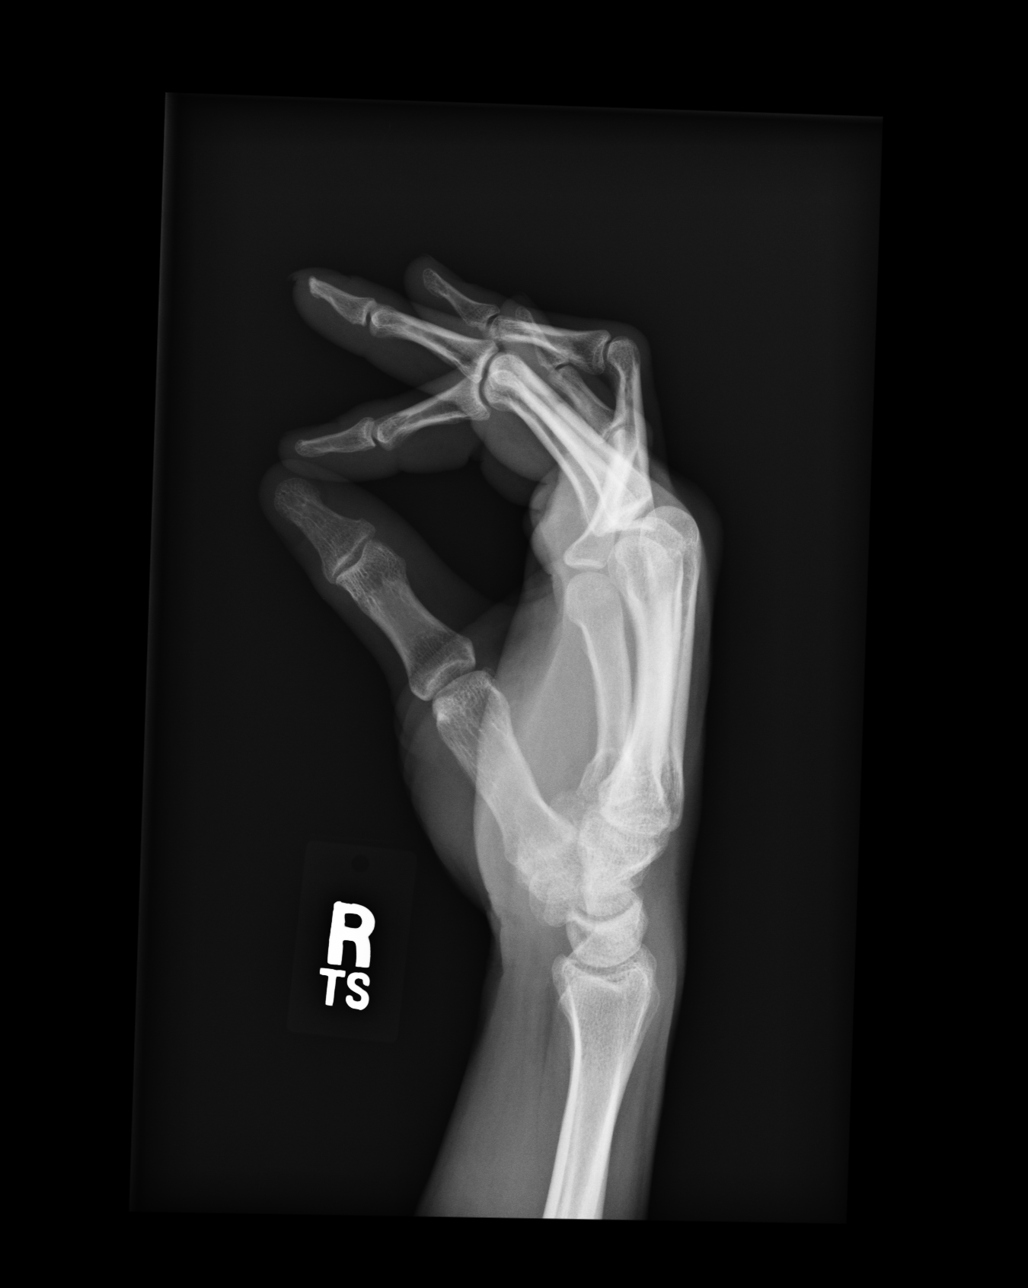
[im 4/4]
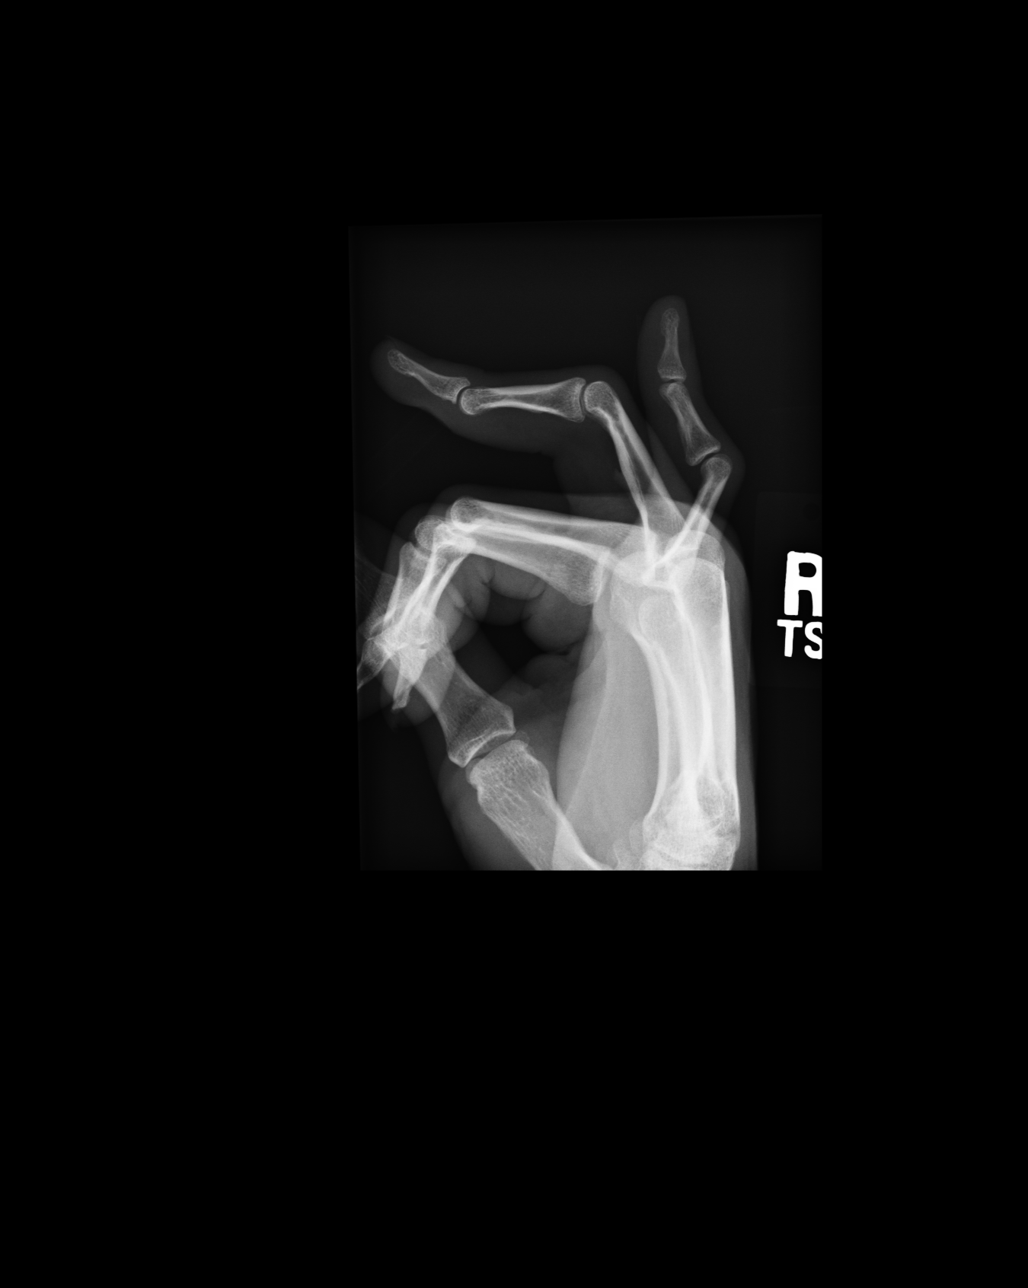

[4 of 4 positions shown; findings below may reference images not displayed]

IMPRESSION: 1.     No fracture is identified.
2.     There is persistent flexion at the PIP joints of the fourth and fifth
fingers.

## 2008-08-24 IMAGING — CR DG CHEST 2V
1 series · 2 of 2 positions shown · non-contrast
Comparison: none

REASON FOR EXAM: Shortness of breath
COMMENTS:  LMP: (Male)

PROCEDURE:     DXR - DXR CHEST PA (OR AP) AND LATERAL  - July 20, 2006  [DATE]
RESULT:     Comparison is made to a study of 12/20/2005. The lungs are clear
and fully inflated. The heart and pulmonary vessels appear to be normal. The
bony and mediastinal structures are unremarkable.

[Series 1: view not recorded · 0.17mm/px · 2 of 2 slices shown]
[im 1/2]
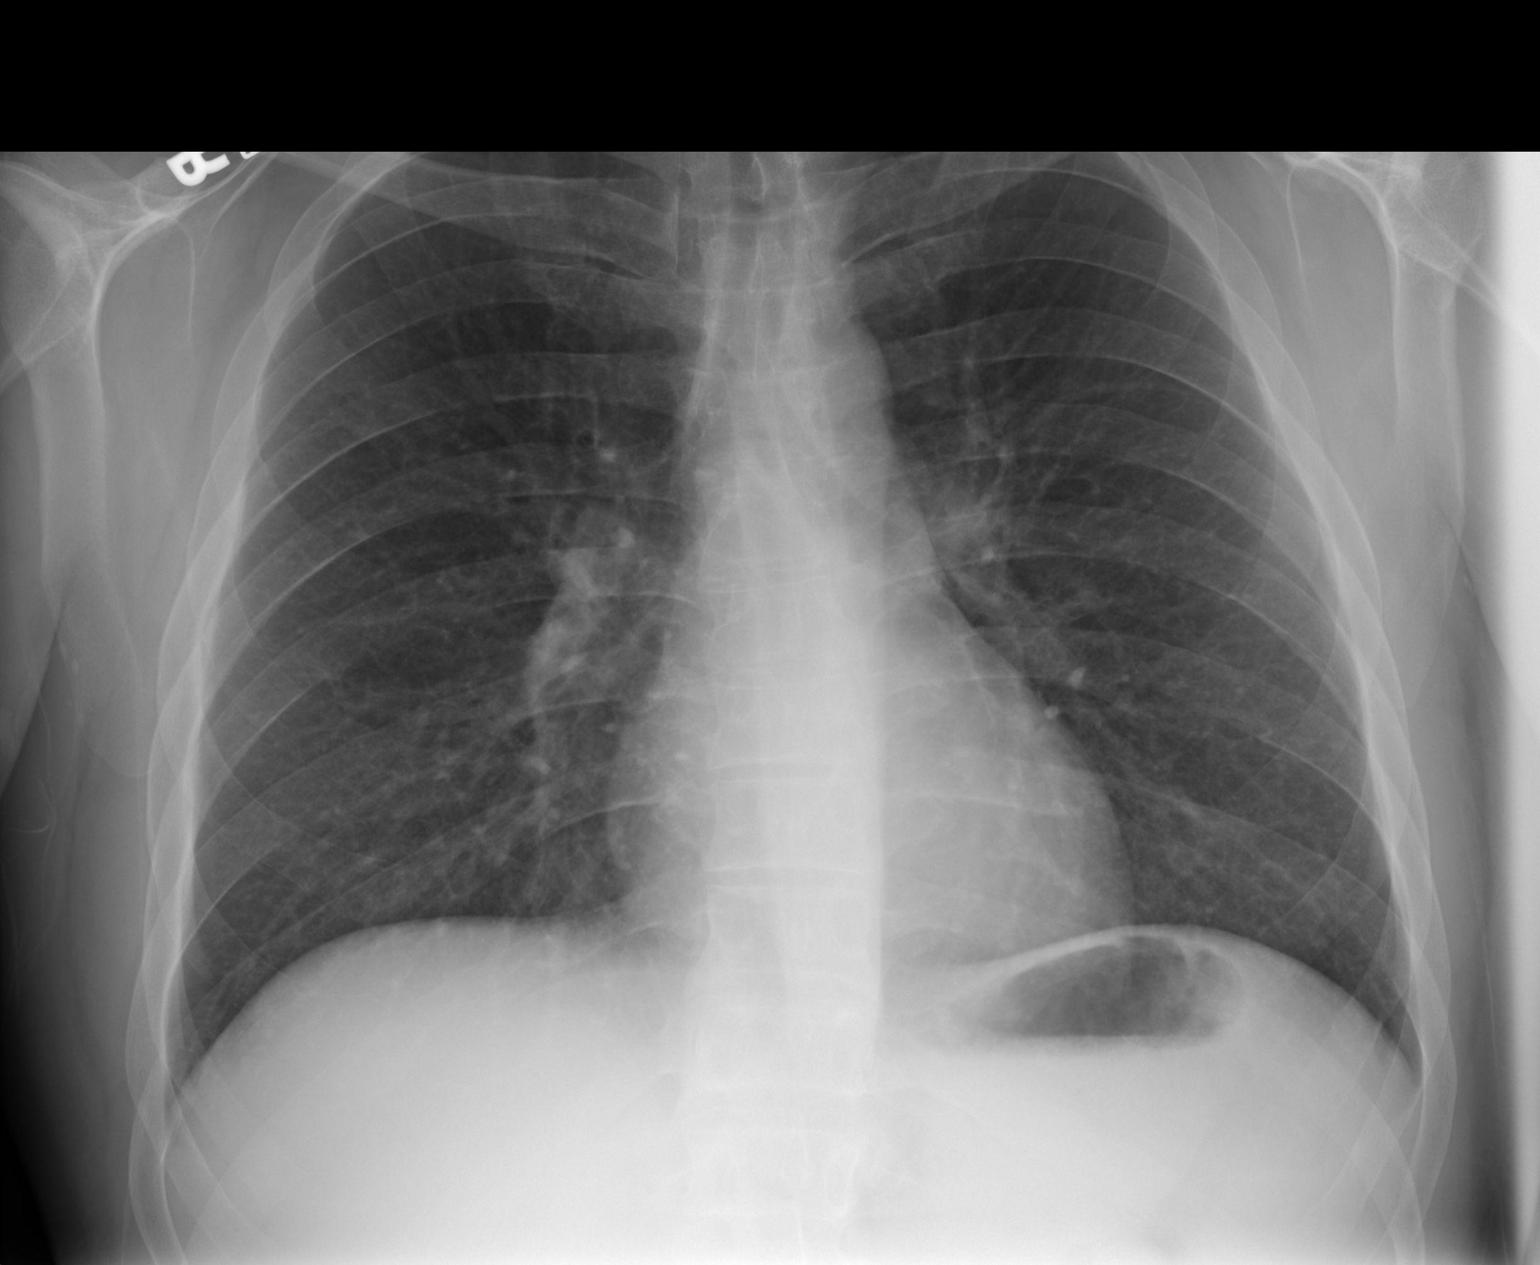
[im 2/2]
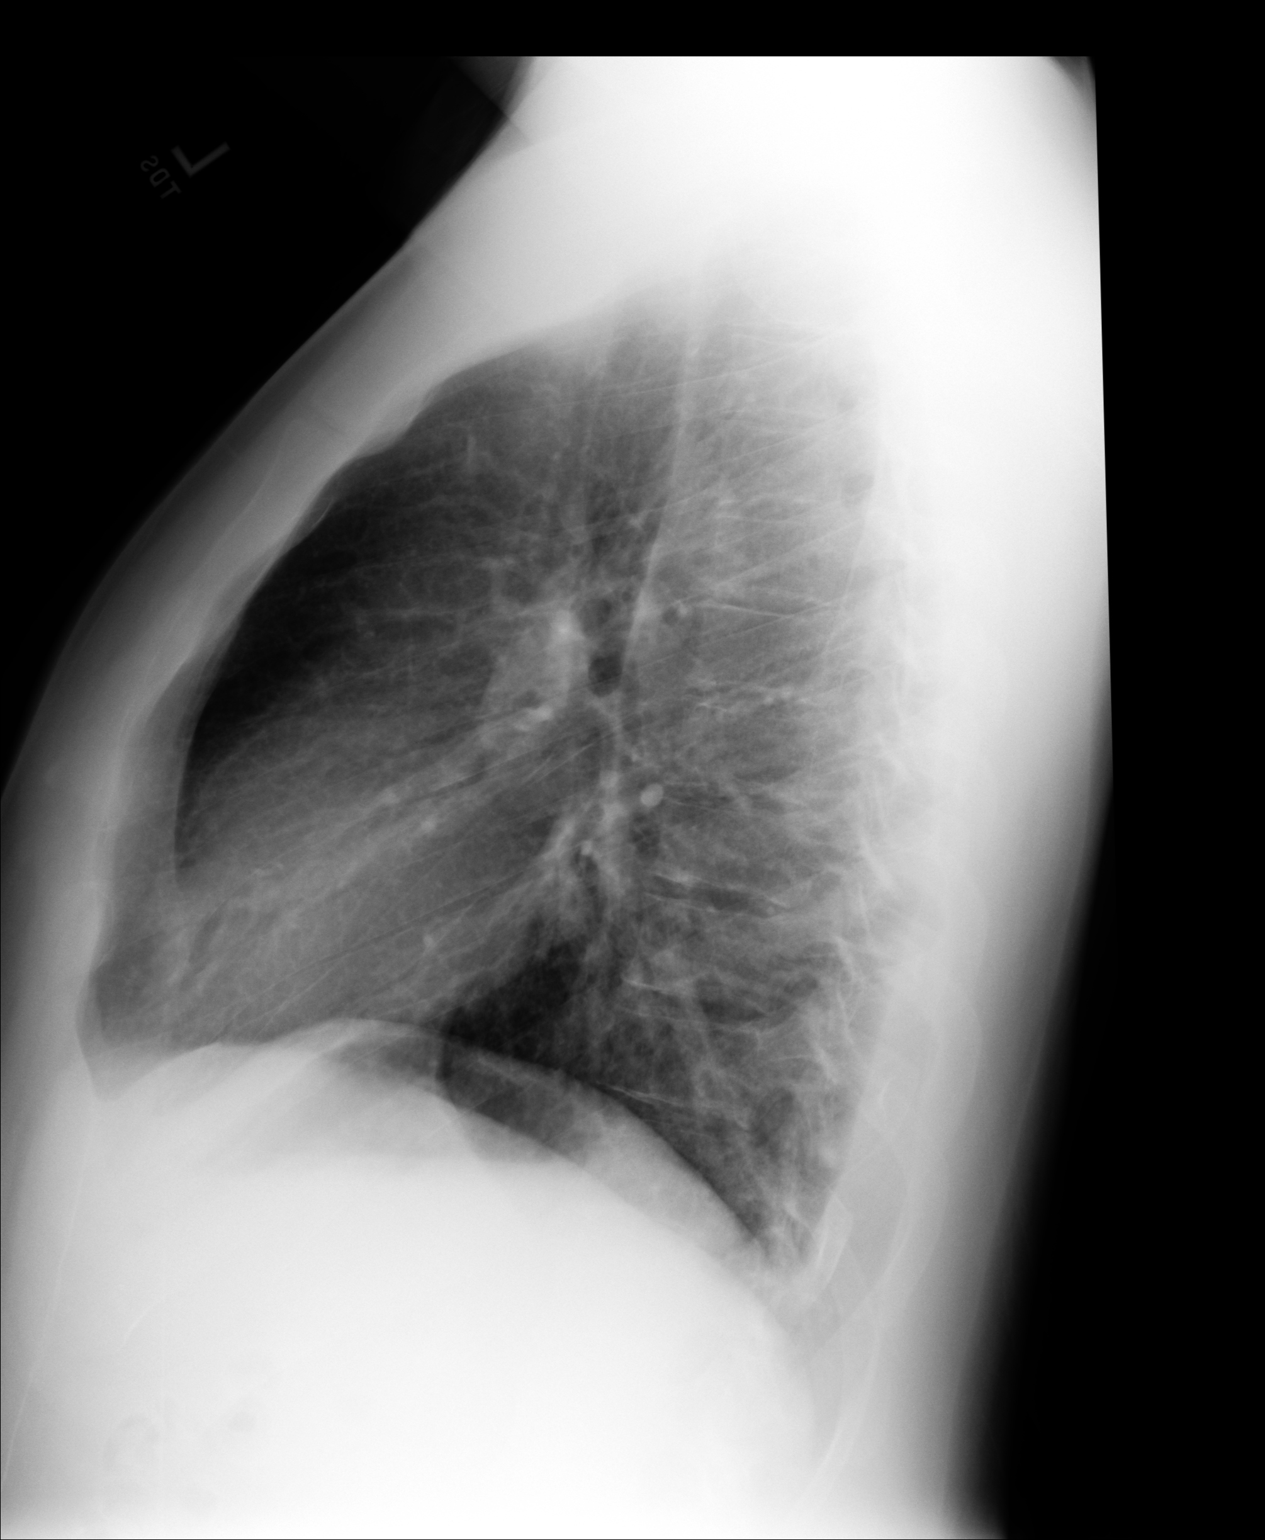

[2 of 2 positions shown; findings below may reference images not displayed]

IMPRESSION: No acute cardiopulmonary disease.

## 2008-09-05 IMAGING — CR DG CHEST 2V
1 series · 2 of 2 positions shown · non-contrast
Comparison: none

REASON FOR EXAM: flu like symptoms  rm 14
COMMENTS:

PROCEDURE:     DXR - DXR CHEST PA (OR AP) AND LATERAL  - August 01, 2006  [DATE]
RESULT:     The current exam is compared to the prior exam of 07-20-06.  The
lung fields remain clear. The heart, mediastinal and osseous structures show
no significant abnormalities.

[Series 1: view not recorded · 0.17mm/px · 2 of 2 slices shown]
[im 1/2]
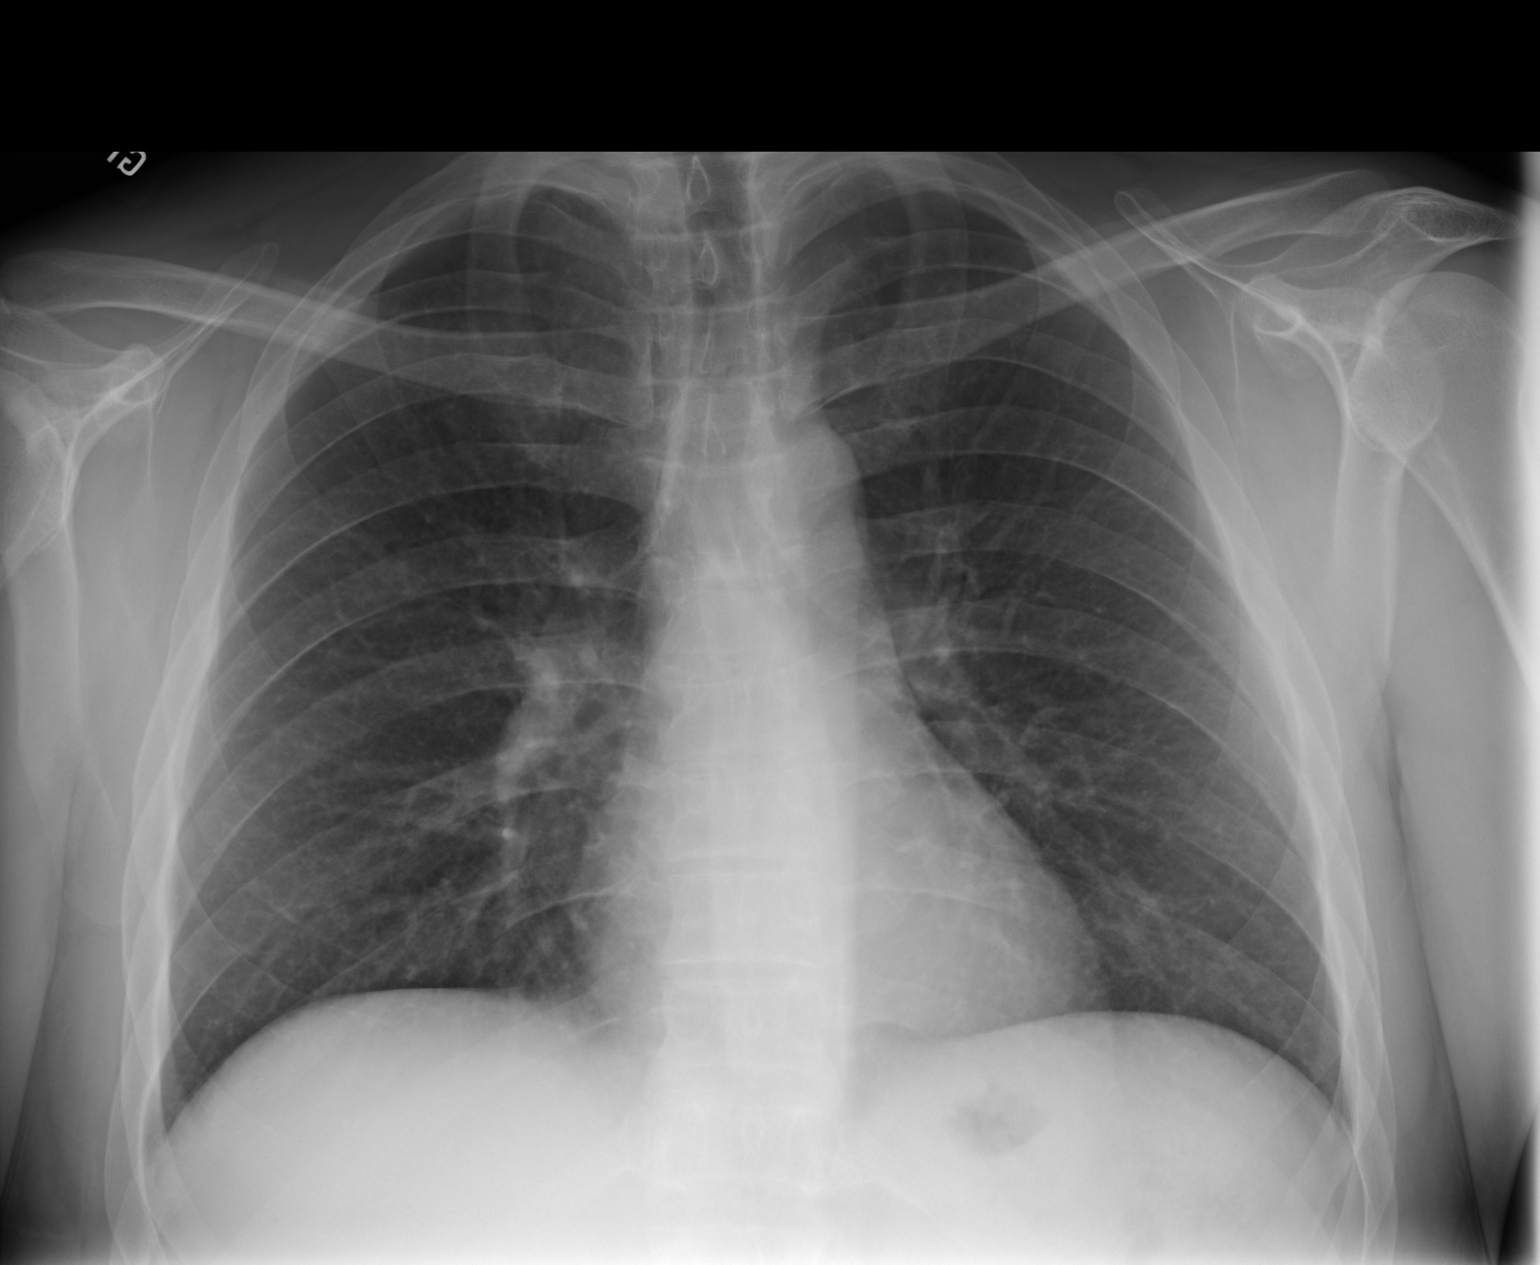
[im 2/2]
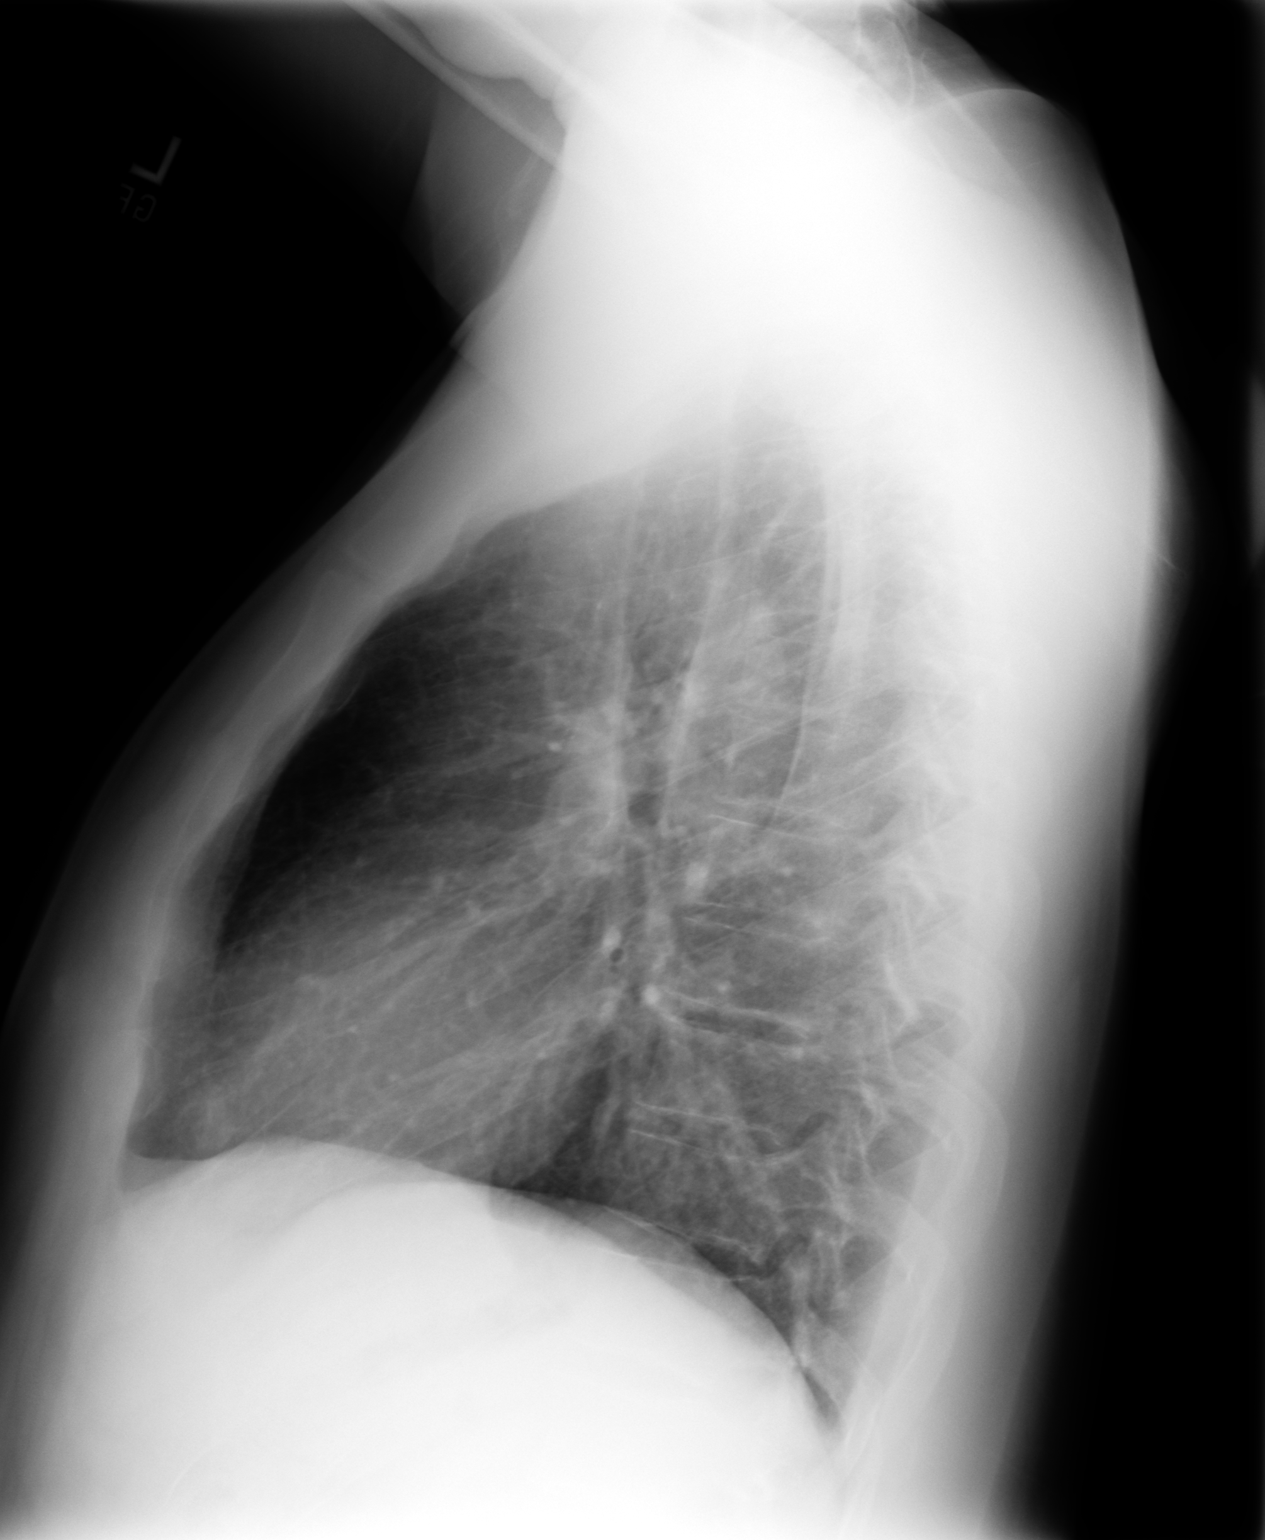

[2 of 2 positions shown; findings below may reference images not displayed]

IMPRESSION: 1)No significant abnormalities are noted.

## 2008-09-11 ENCOUNTER — Emergency Department: Payer: Self-pay | Admitting: Emergency Medicine

## 2008-10-02 IMAGING — CT CT ABD-PELV W/ CM
1 of 2 series · 16 of 32 positions shown, 20 images · non-contrast
Comparison: none

REASON FOR EXAM: (1) Abdominal pain/iv/po contrast//rm 14; (2) abd
pain/iv/po contrast//rm 14
COMMENTS:  LMP: (Male)

[Series 2: abdomen · axial · 0.74mm/px · z∈[+126,+566]mm · 16 of 61 slices shown, 20 images]
[im 3/61  soft-tissue]
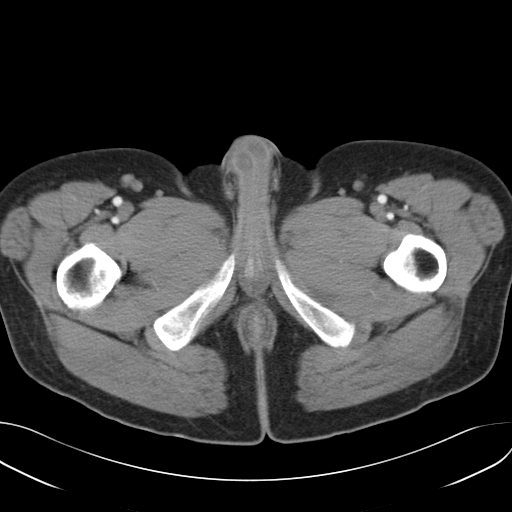
[im 3/61  bone]
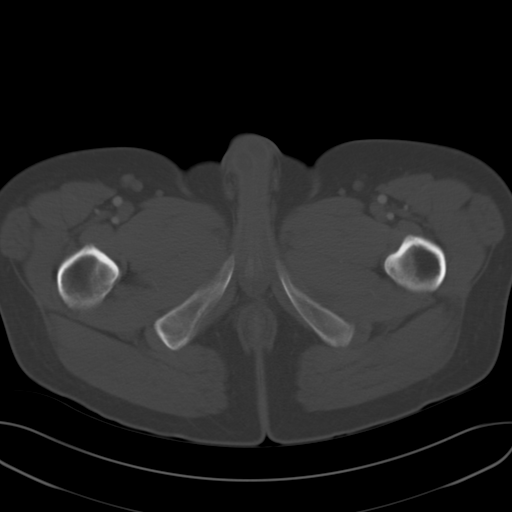
[im 8/61  soft-tissue]
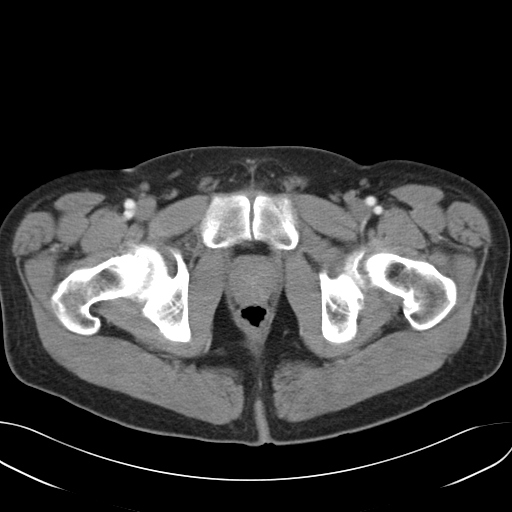
[im 13/61  soft-tissue]
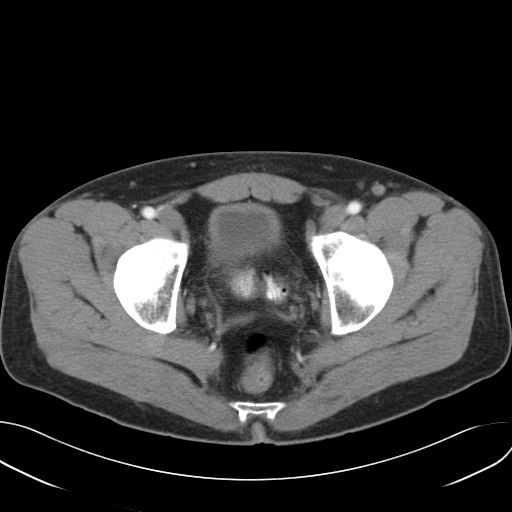
[im 17/61  soft-tissue]
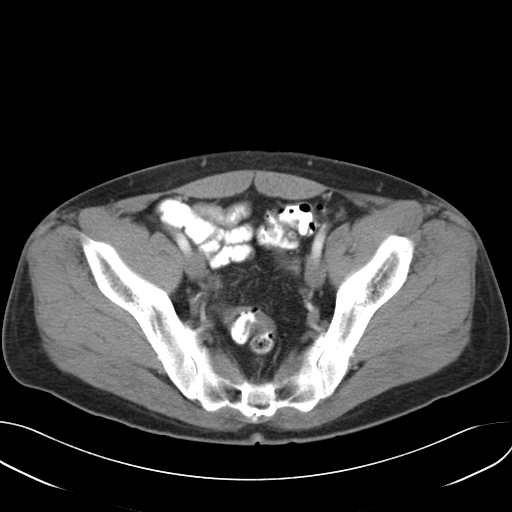
[im 20/61  soft-tissue]
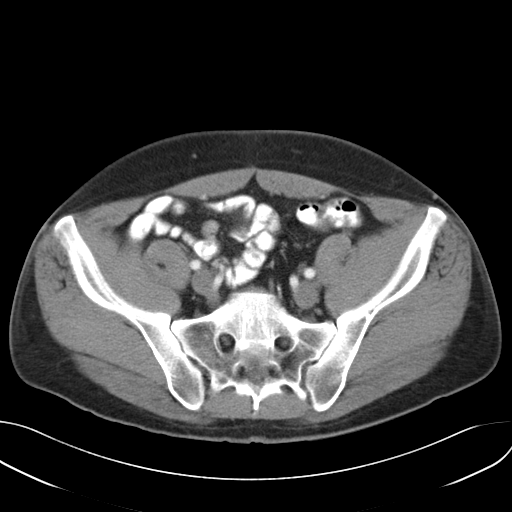
[im 25/61  soft-tissue]
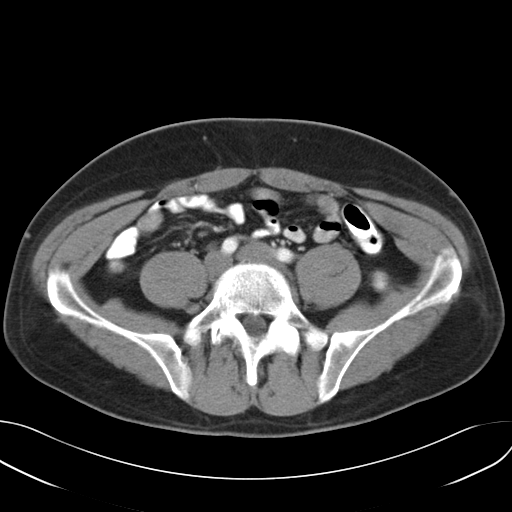
[im 29/61  soft-tissue]
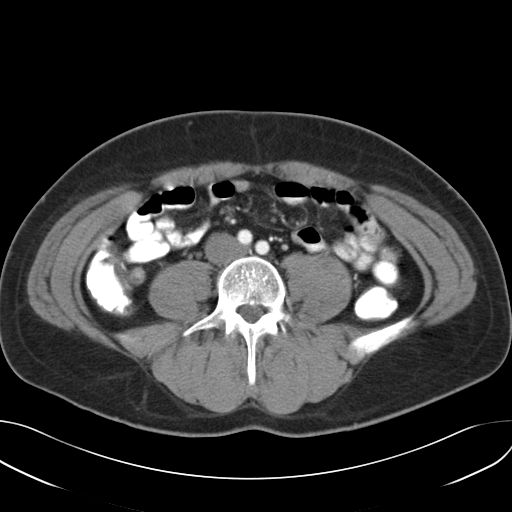
[im 32/61  soft-tissue]
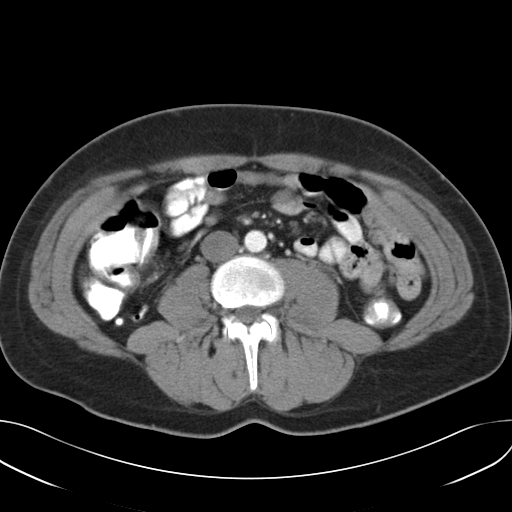
[im 37/61  soft-tissue]
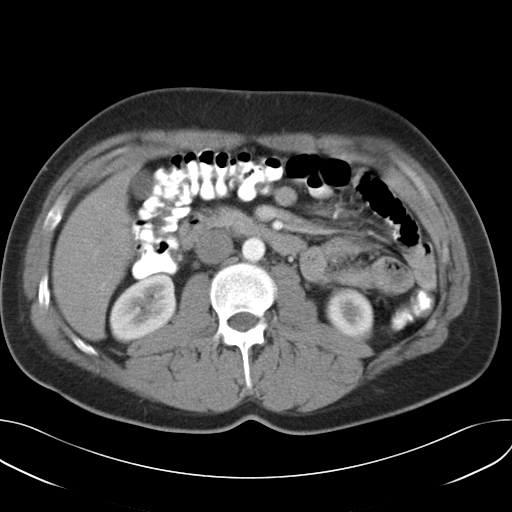
[im 37/61  bone]
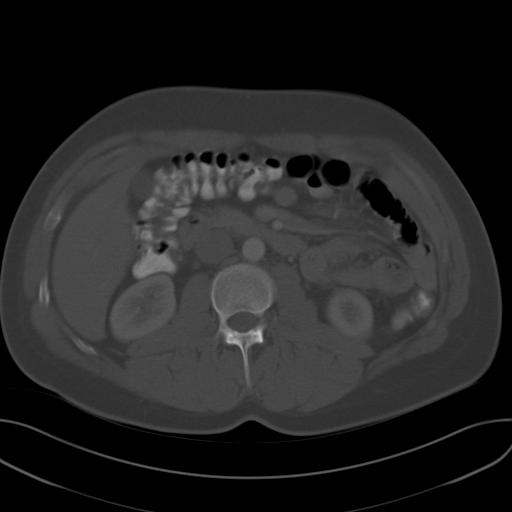
[im 41/61  soft-tissue]
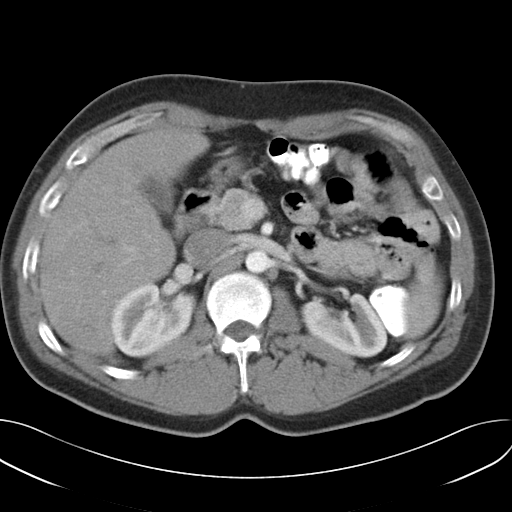
[im 46/61  soft-tissue]
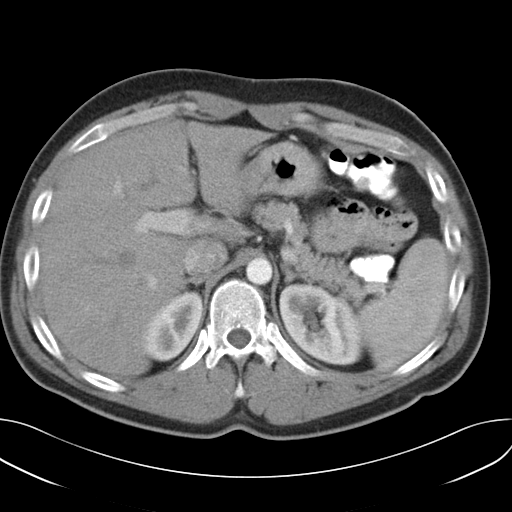
[im 49/61  soft-tissue]
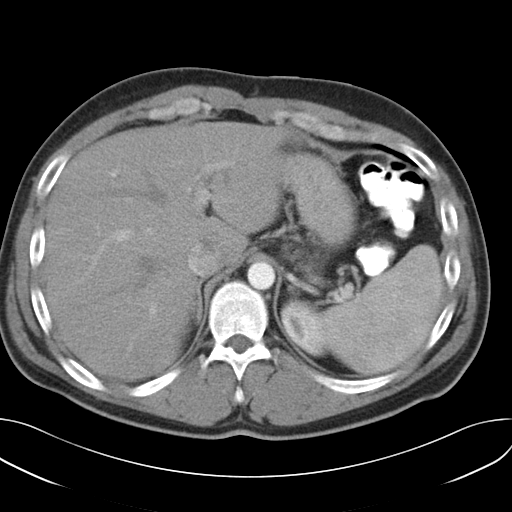
[im 51/61  lung]
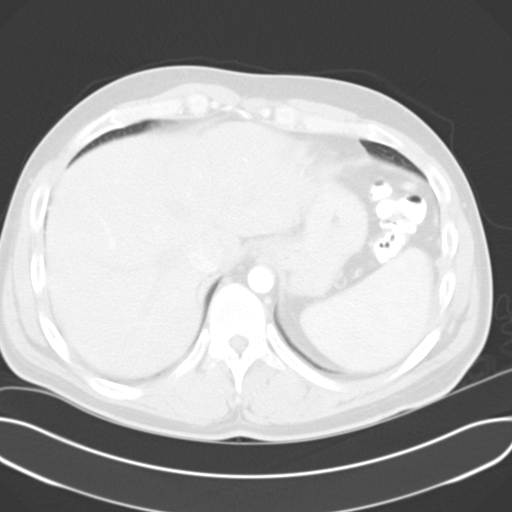
[im 53/61  soft-tissue]
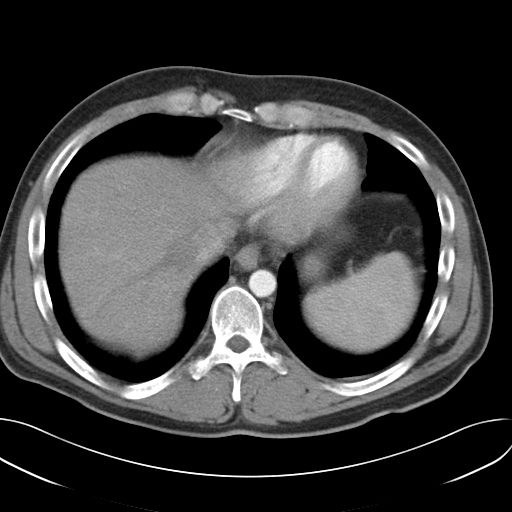
[im 53/61  lung]
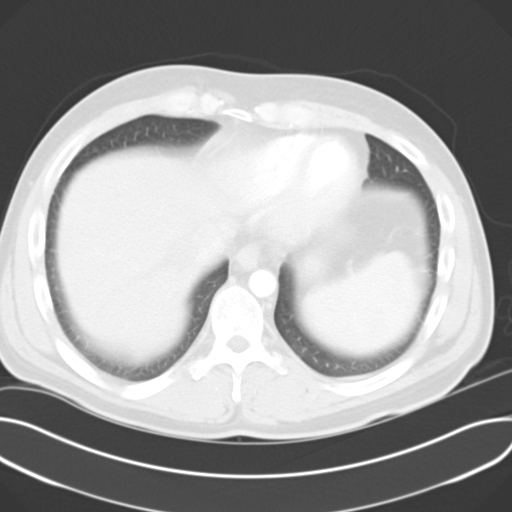
[im 56/61  lung]
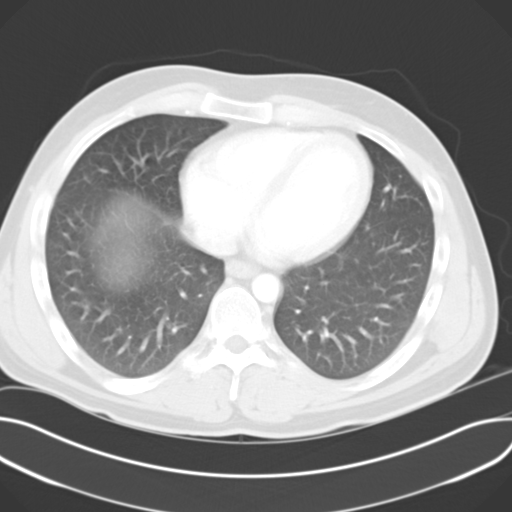
[im 58/61  soft-tissue]
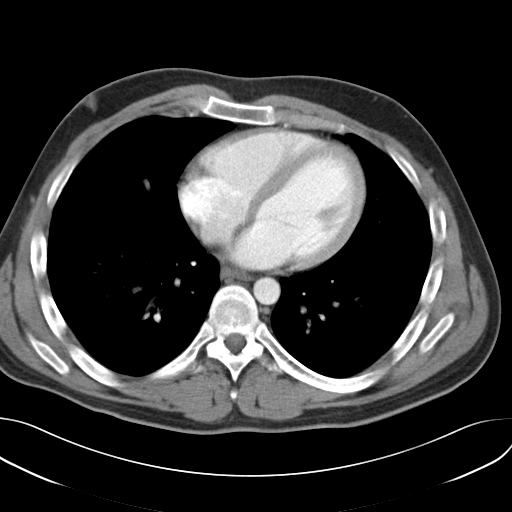
[im 58/61  lung]
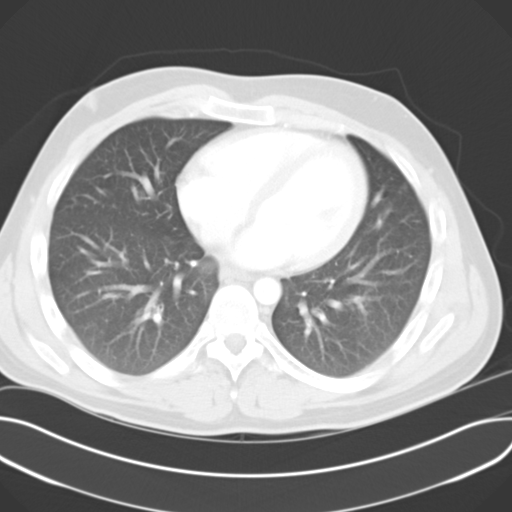

[16 of 32 positions shown; findings below may reference images not displayed]

PROCEDURE:     CT  - CT ABDOMEN / PELVIS  W  - August 28, 2006  [DATE]

RESULT:       IV contrast enhanced CT of the Abdomen and Pelvis was
obtained.  The liver and spleen are normal.  The pancreas is normal.   The
adrenals are normal.  No focal renal abnormalities are identified.  There is
no bowel distention.   The appendix is normal.  No inguinal adenopathy is
noted.  Sigmoid colonic diverticulosis is noted.
IMPRESSION: 1.     Sigmoid colonic diverticulosis.
2.     No acute abnormality is otherwise noted.

This report was phoned to the Emergency Room physician at the time of the
study.

## 2008-10-02 IMAGING — CR DG ABDOMEN 3V
1 series · 5 of 5 positions shown · non-contrast
Comparison: none

REASON FOR EXAM: Abdominal pain//rm 14
COMMENTS:  LMP: (Male)

[Series 1: view not recorded · 0.17mm/px · 5 of 5 slices shown]
[im 1/5]
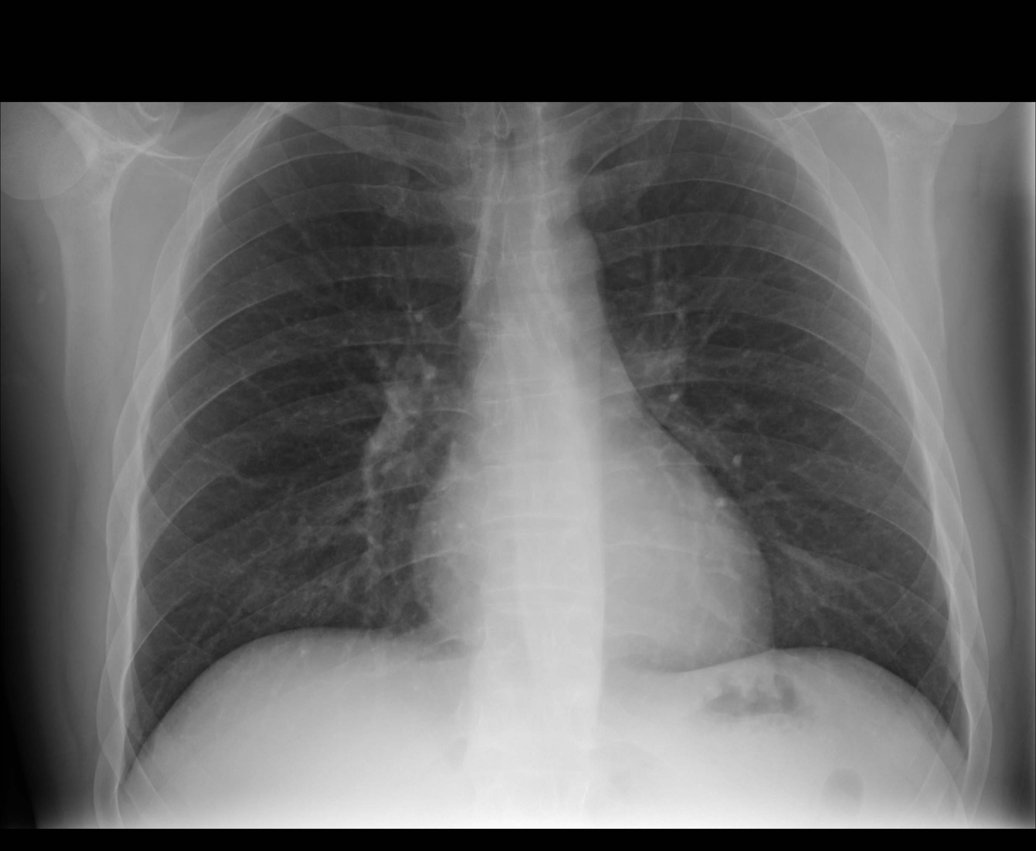
[im 2/5]
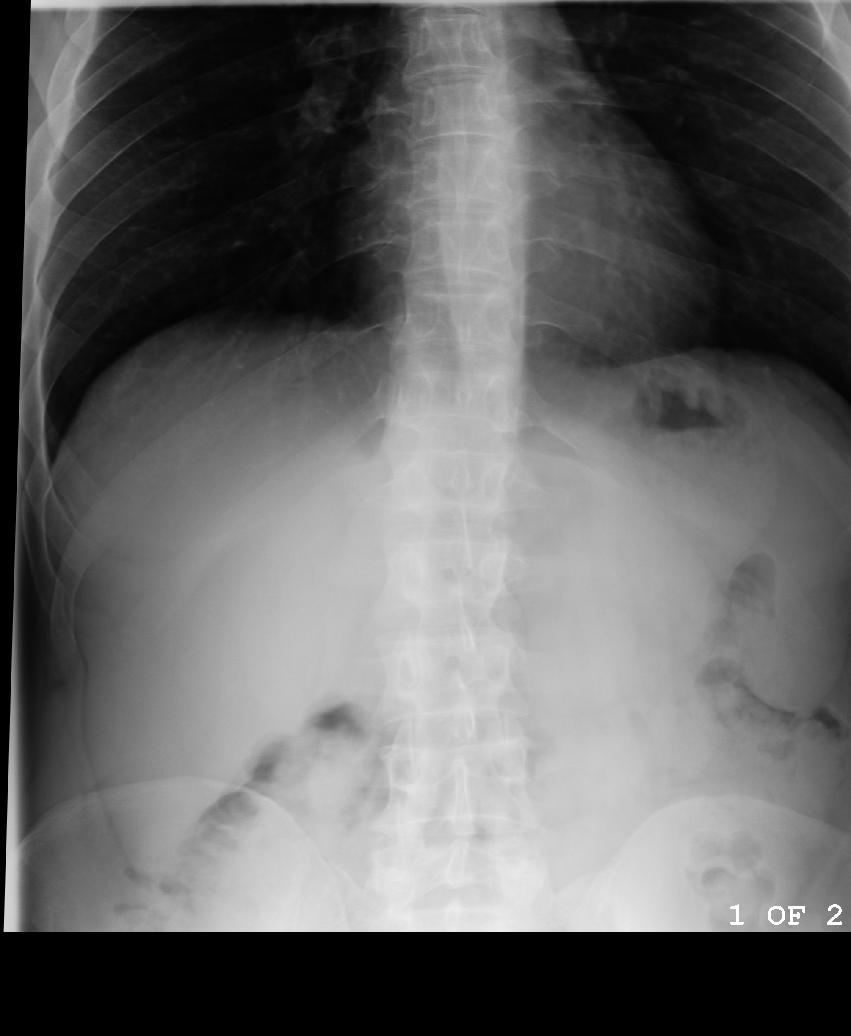
[im 3/5]
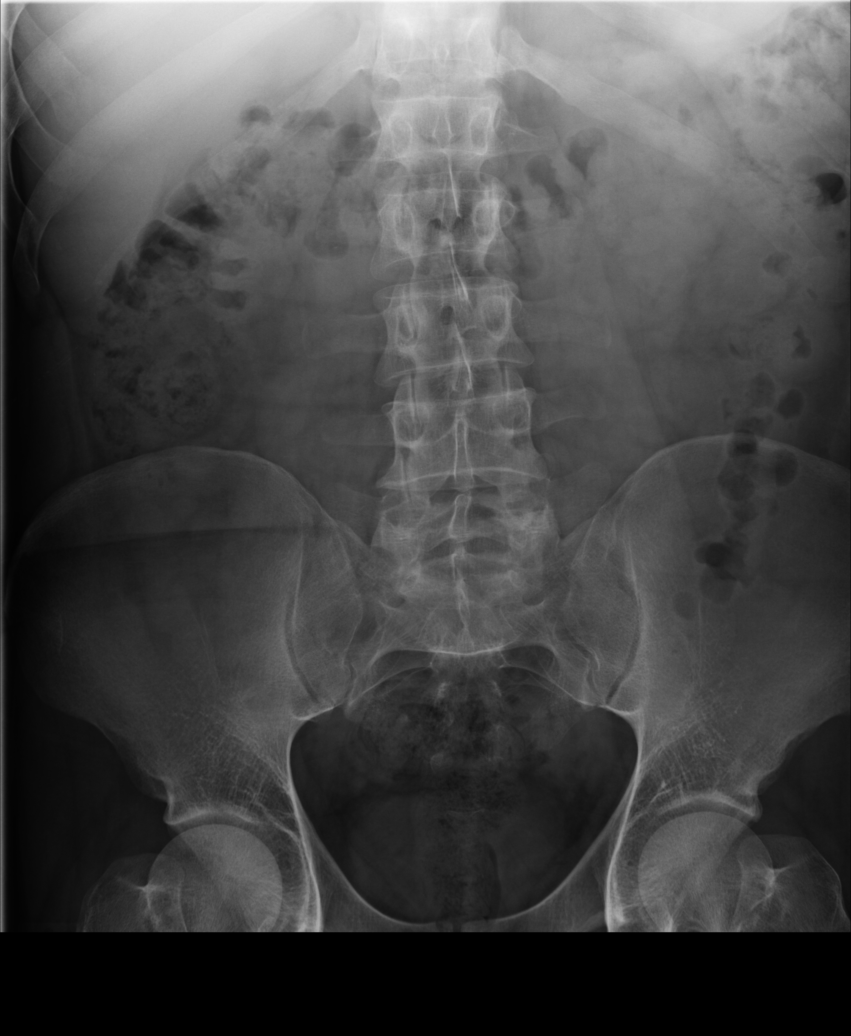
[im 4/5]
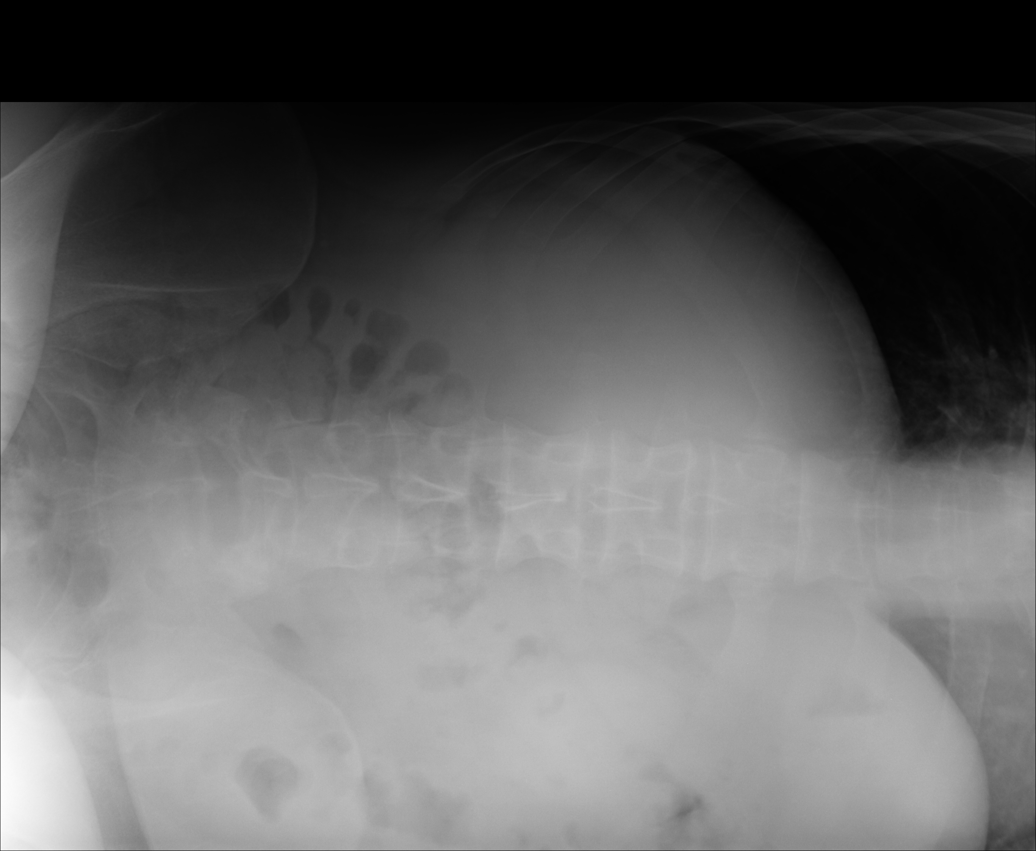
[im 5/5]
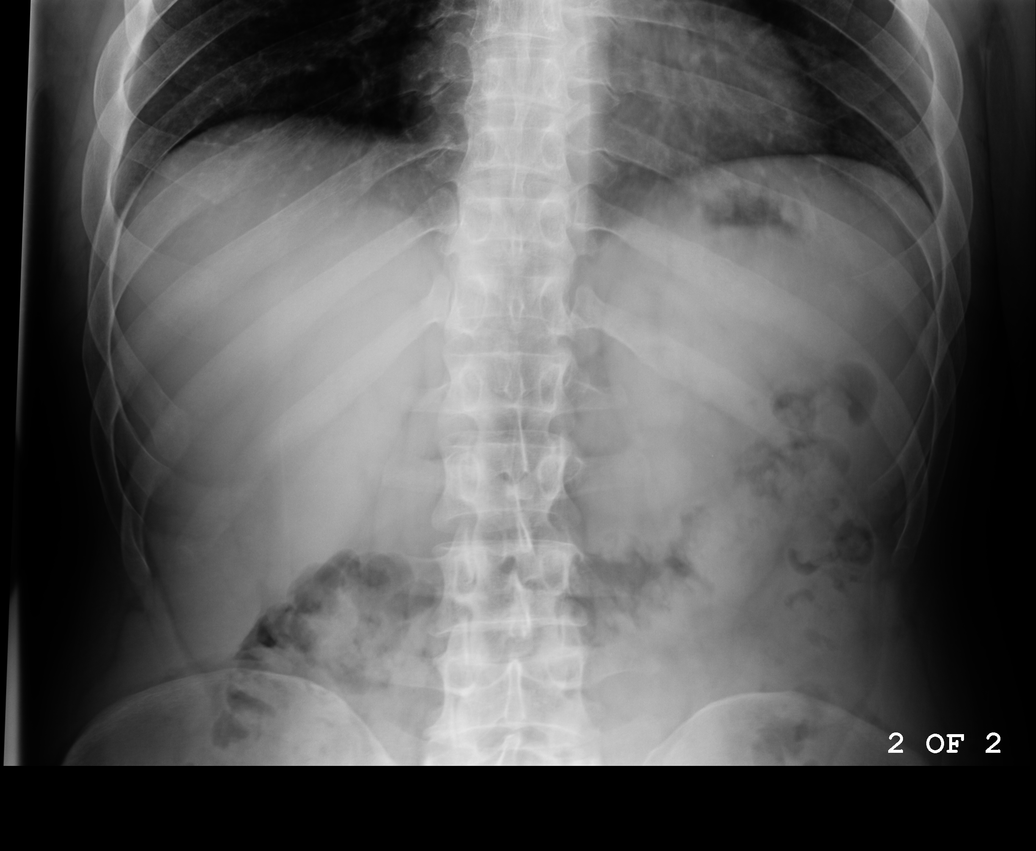

[5 of 5 positions shown; findings below may reference images not displayed]

PROCEDURE:     DXR - DXR ABDOMEN 3-WAY (INCL PA CXR)  - August 28, 2006 [DATE]

RESULT:        The chest x-ray reveals the lungs to be well expanded and
clear.  The heart and pulmonary vascularity are within the limits of normal.
 Views of the abdomen reveal a relatively nonspecific bowel gas pattern.
There may be an element of constipation present.  There is no evidence of
obstruction.  I see no acute bony abnormality.
IMPRESSION: I do not see evidence of acute cardiopulmonary disease.

Within the abdomen, the bowel gas pattern suggest constipation.

## 2008-10-02 IMAGING — CT CT HEAD WITHOUT CONTRAST
2 series · 16 of 30 positions shown, 20 images · non-contrast
Comparison: none

REASON FOR EXAM: Weakness//rm 14
COMMENTS:  LMP: (Male)

PROCEDURE:     CT  - CT HEAD WITHOUT CONTRAST  - August 28, 2006  [DATE]
RESULT:       No intraaxial or extraaxial pathologic fluid or blood
collections are identified.   No mass lesion is noted.  There is no
hydrocephalus.

[Series 2: without · axial · non-contrast · 0.41mm/px · z∈[+890,+1010]mm · 13 of 30 slices shown, 17 images]
[im 3/30  brain]
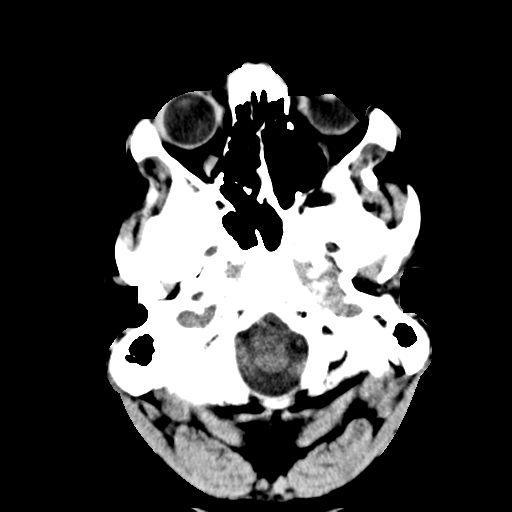
[im 3/30  bone]
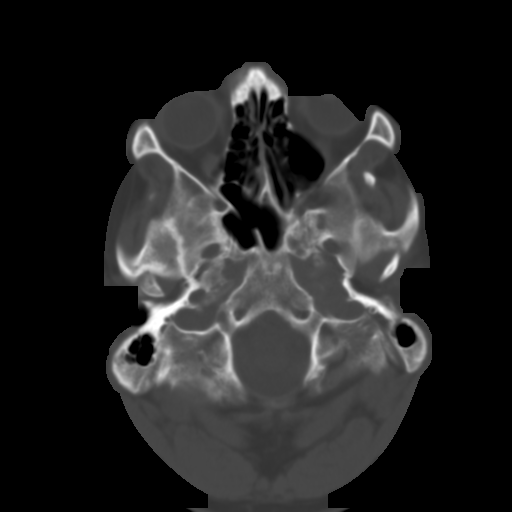
[im 5/30  brain]
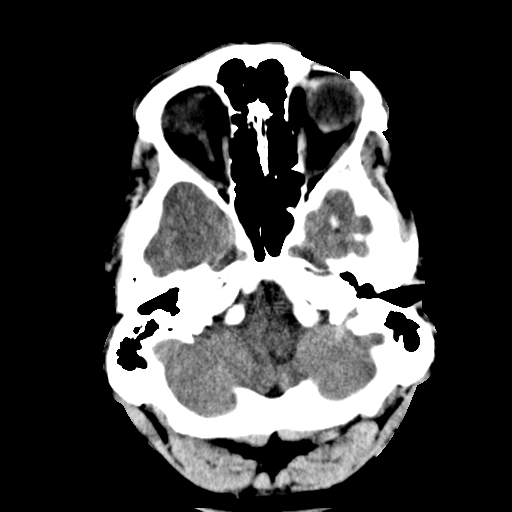
[im 7/30  brain]
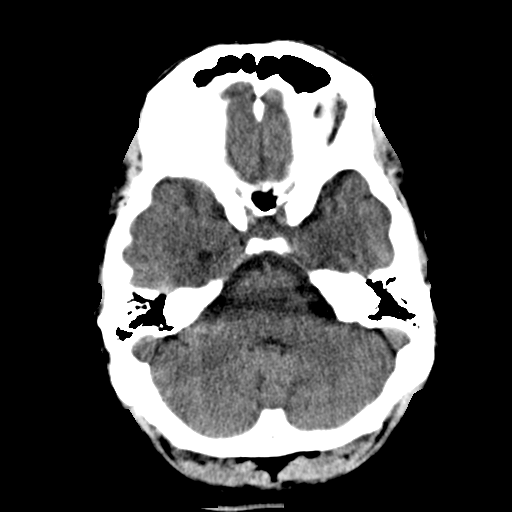
[im 9/30  brain]
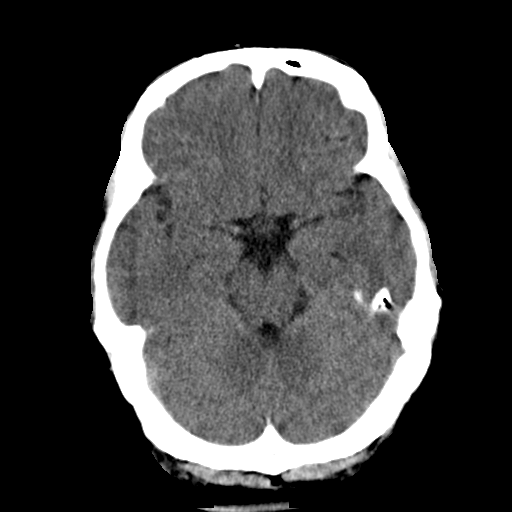
[im 11/30  brain]
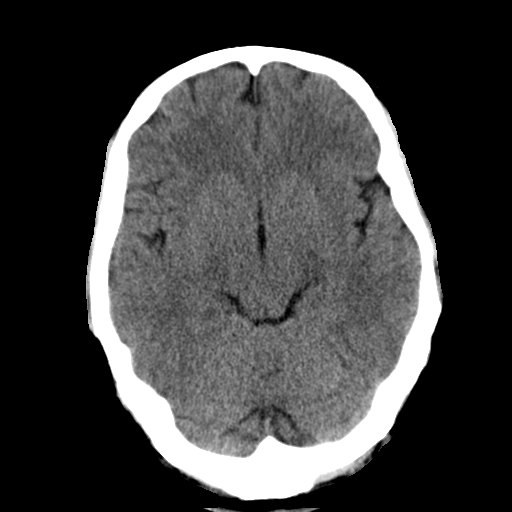
[im 11/30  bone]
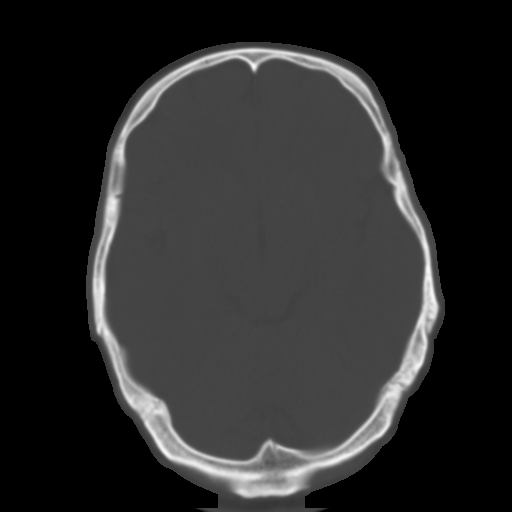
[im 13/30  brain]
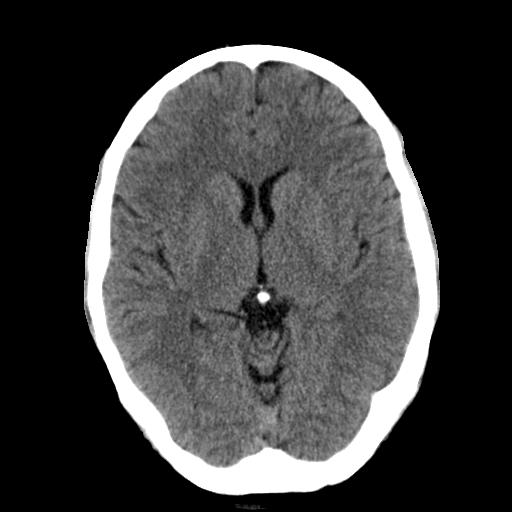
[im 15/30  brain]
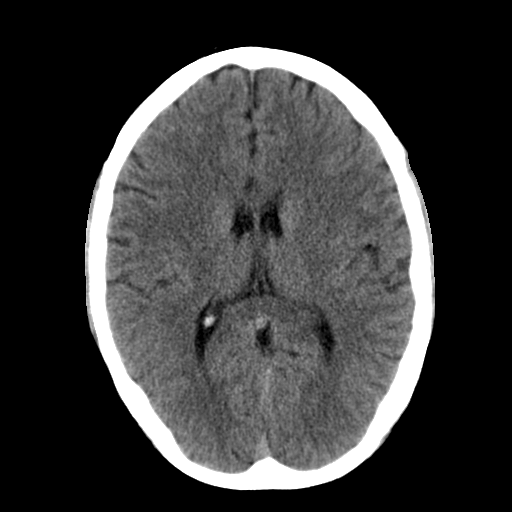
[im 17/30  brain]
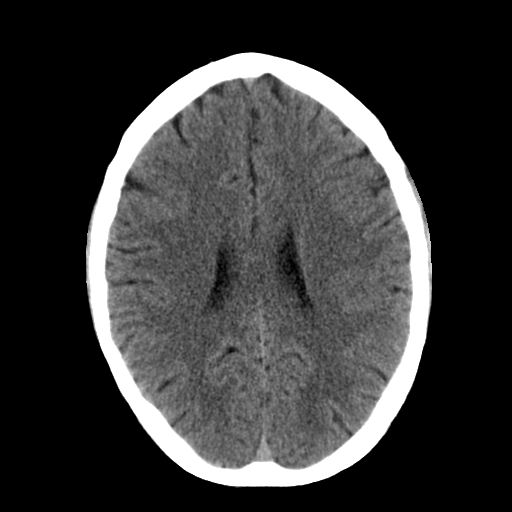
[im 19/30  brain]
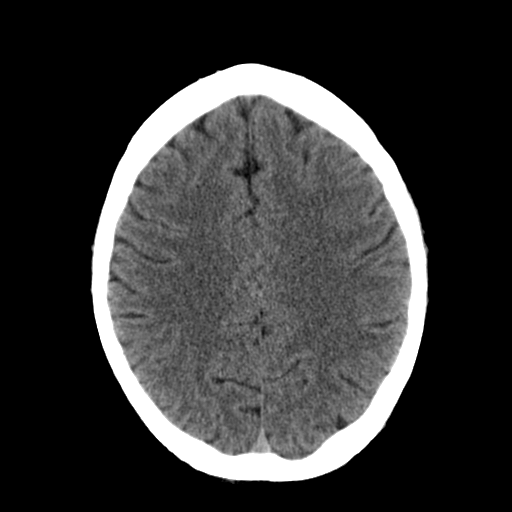
[im 19/30  bone]
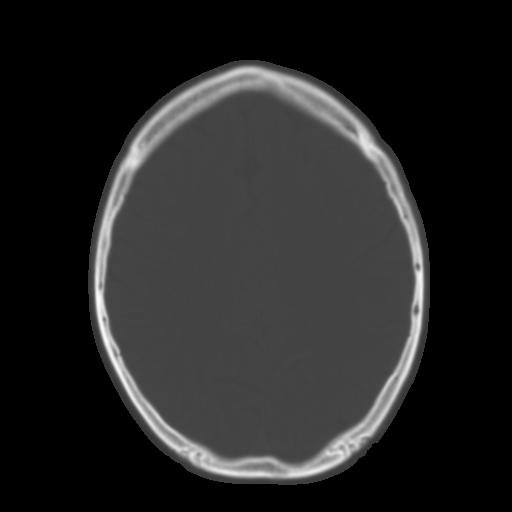
[im 21/30  brain]
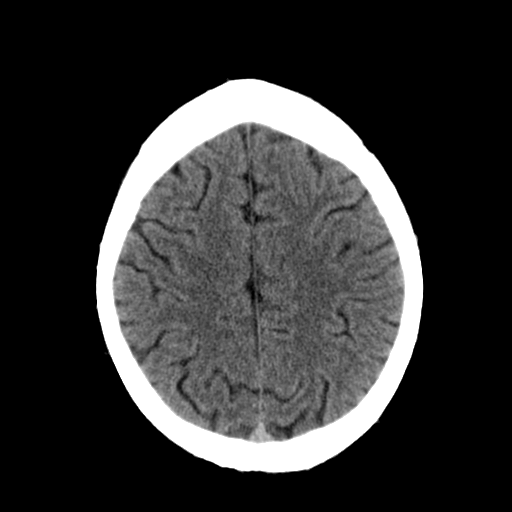
[im 23/30  brain]
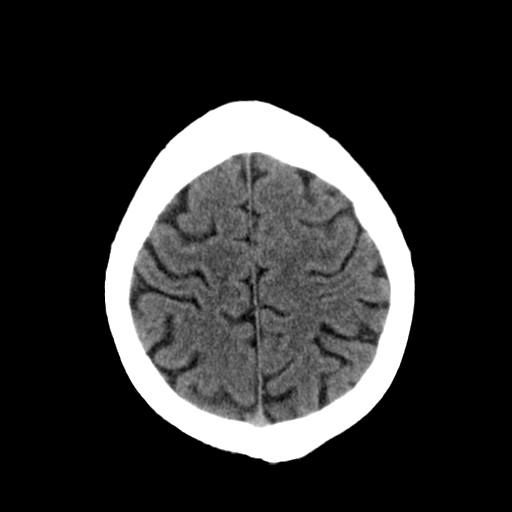
[im 25/30  brain]
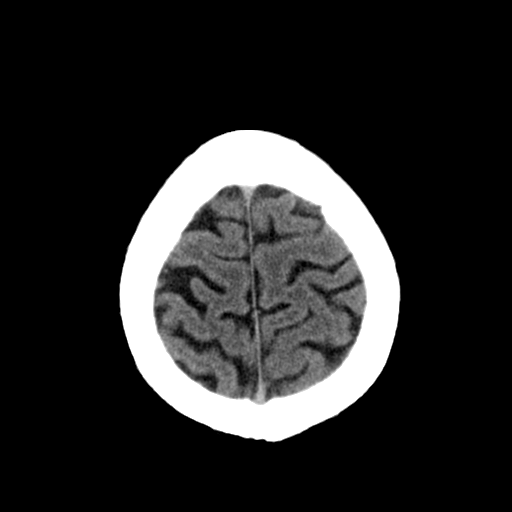
[im 27/30  brain]
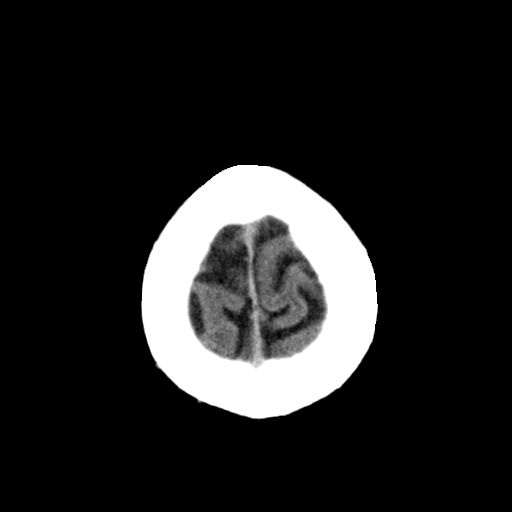
[im 27/30  bone]
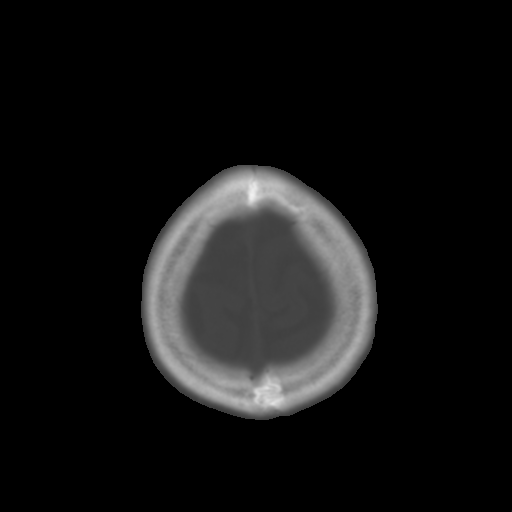

[Series 3: bone · axial · 0.41mm/px · z∈[+890,+930]mm · 3 of 30 slices shown]
[im 3/30  bone]
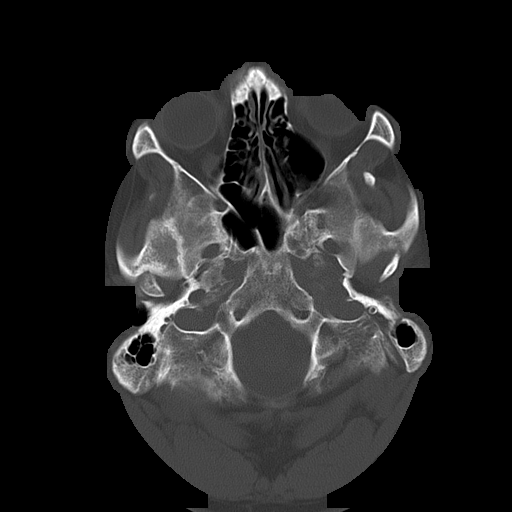
[im 7/30  bone]
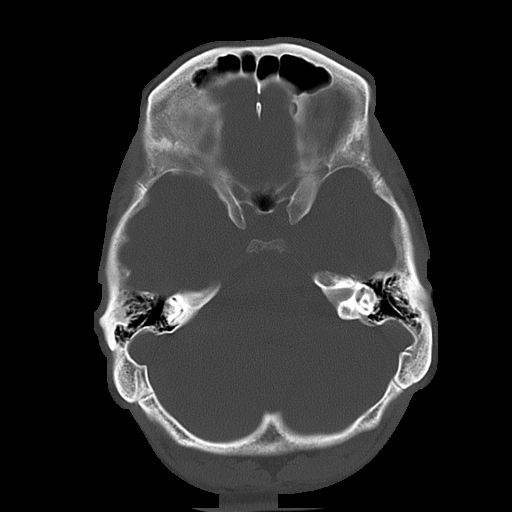
[im 11/30  bone]
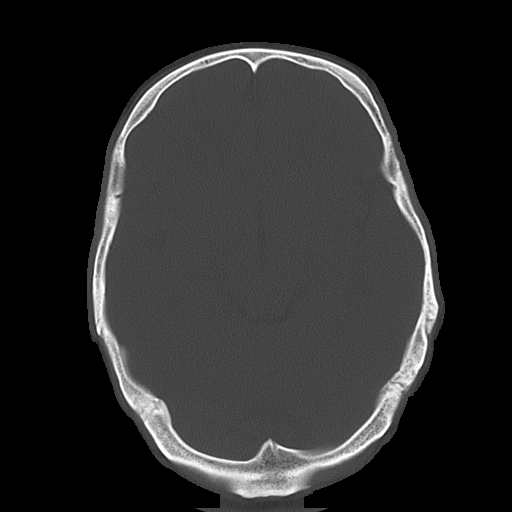

[16 of 30 positions shown; findings below may reference images not displayed]

IMPRESSION: No acute intracranial abnormality is identified.
This report was phoned to the Emergency Room physician at the time of the
study.

## 2008-10-04 IMAGING — CR DG ABDOMEN 1V
1 series · 1 of 1 positions shown · non-contrast
Comparison: none

REASON FOR EXAM: Left LLQ pain
COMMENTS:

[view not recorded]
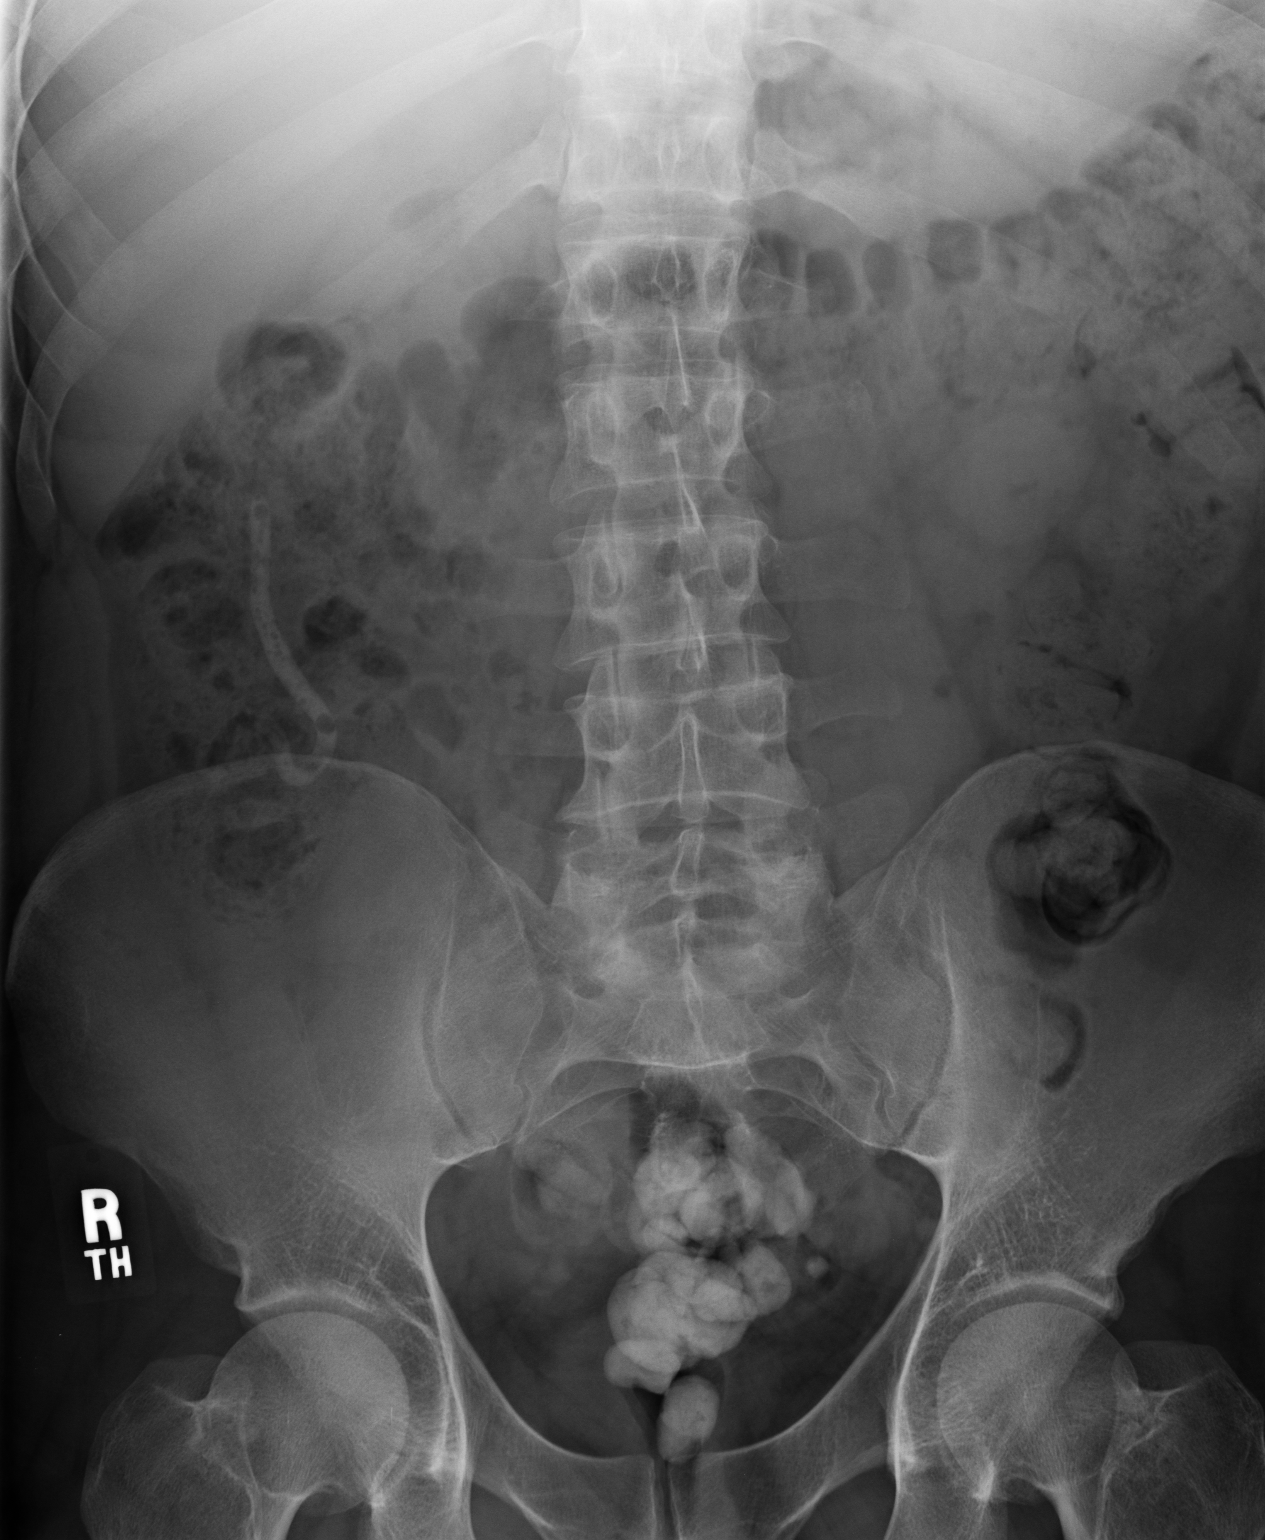

[1 of 1 positions shown; findings below may reference images not displayed]

PROCEDURE:     DXR - DXR ABDOMEN AP ONLY  - August 30, 2006  [DATE]

RESULT:       There is a large amount of stool noted throughout the colon.
Some radiodense stool is present in the rectum. The appendix is visible as a
radiodense structure that is normal in caliber. I see no acute bony
abnormality and no soft tissue calcification.
IMPRESSION: The bowel gas pattern is consistent with constipation.

## 2008-10-11 ENCOUNTER — Emergency Department: Payer: Self-pay | Admitting: Emergency Medicine

## 2008-10-13 IMAGING — CR DG ABDOMEN 3V
1 series · 4 of 4 positions shown · non-contrast
Comparison: none

REASON FOR EXAM: Abdominal pain. Rm 2
COMMENTS:

[Series 1: view not recorded · 0.17mm/px · 4 of 4 slices shown]
[im 1/4]
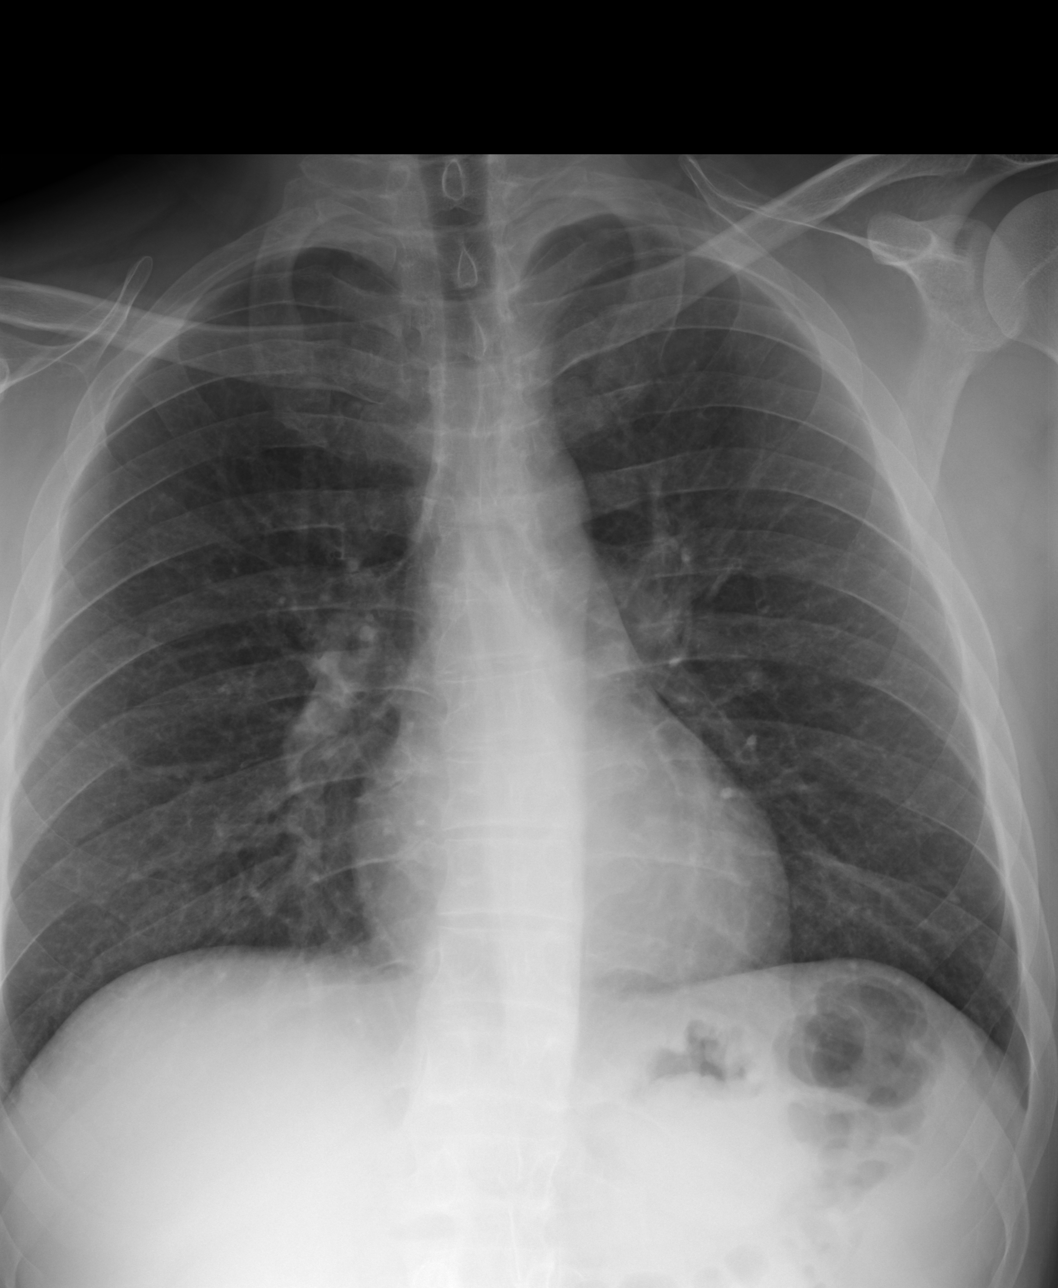
[im 2/4]
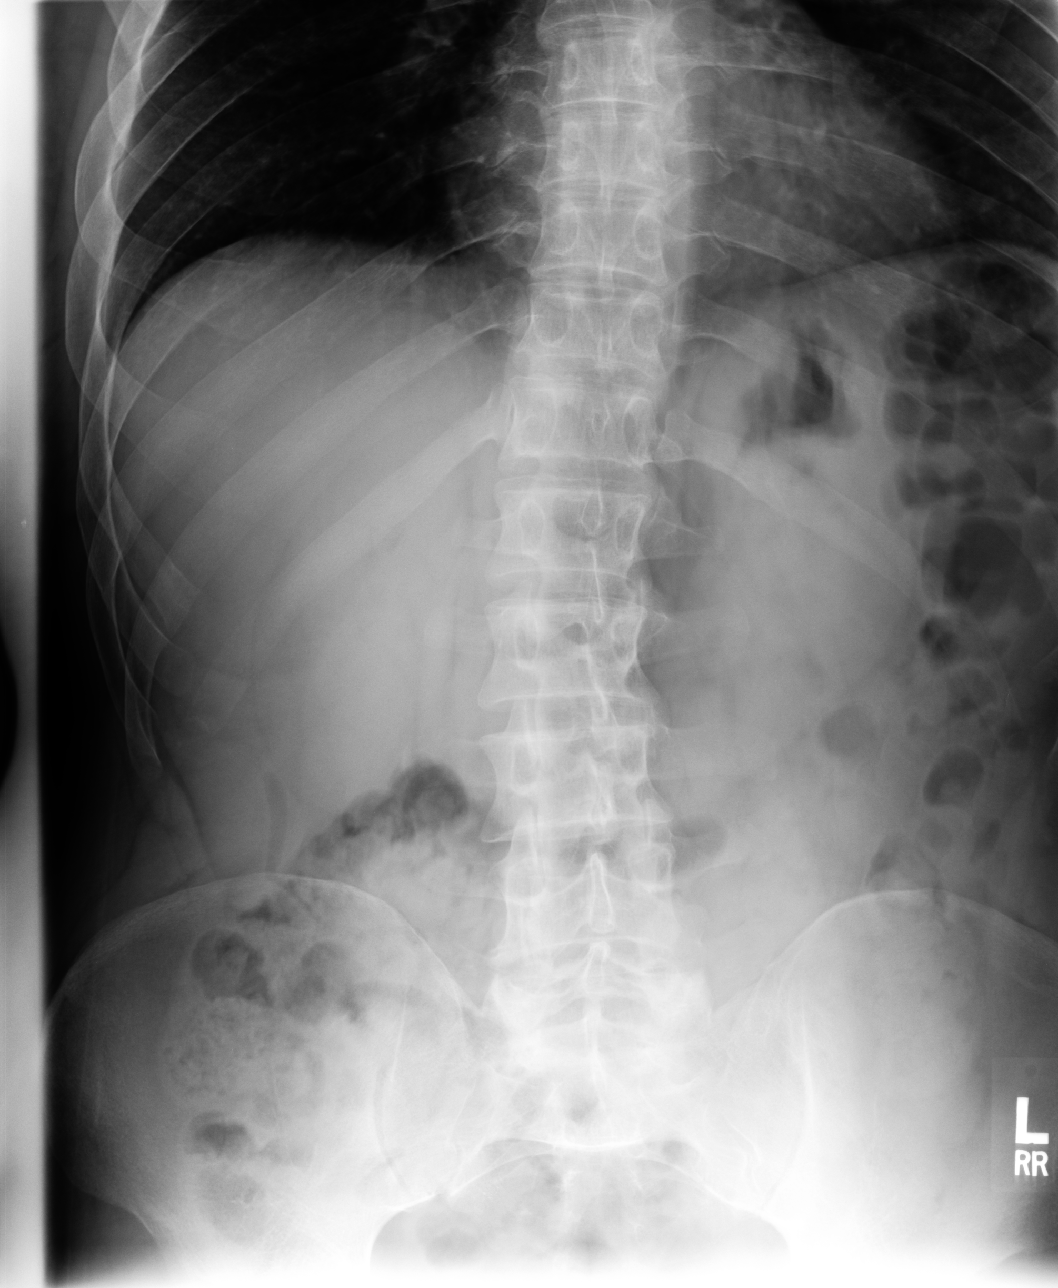
[im 3/4]
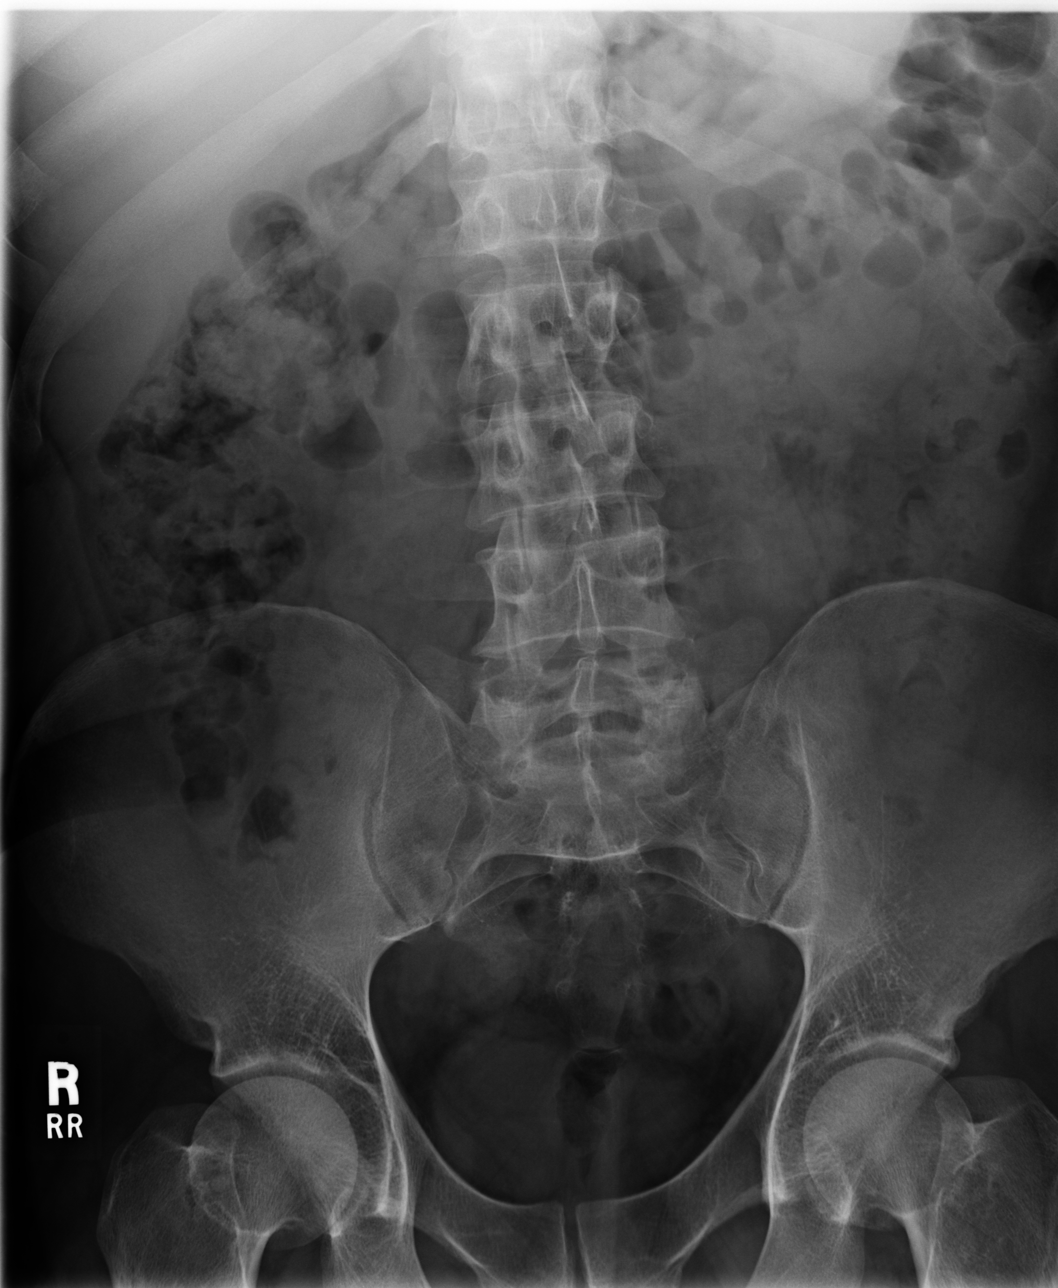
[im 4/4]
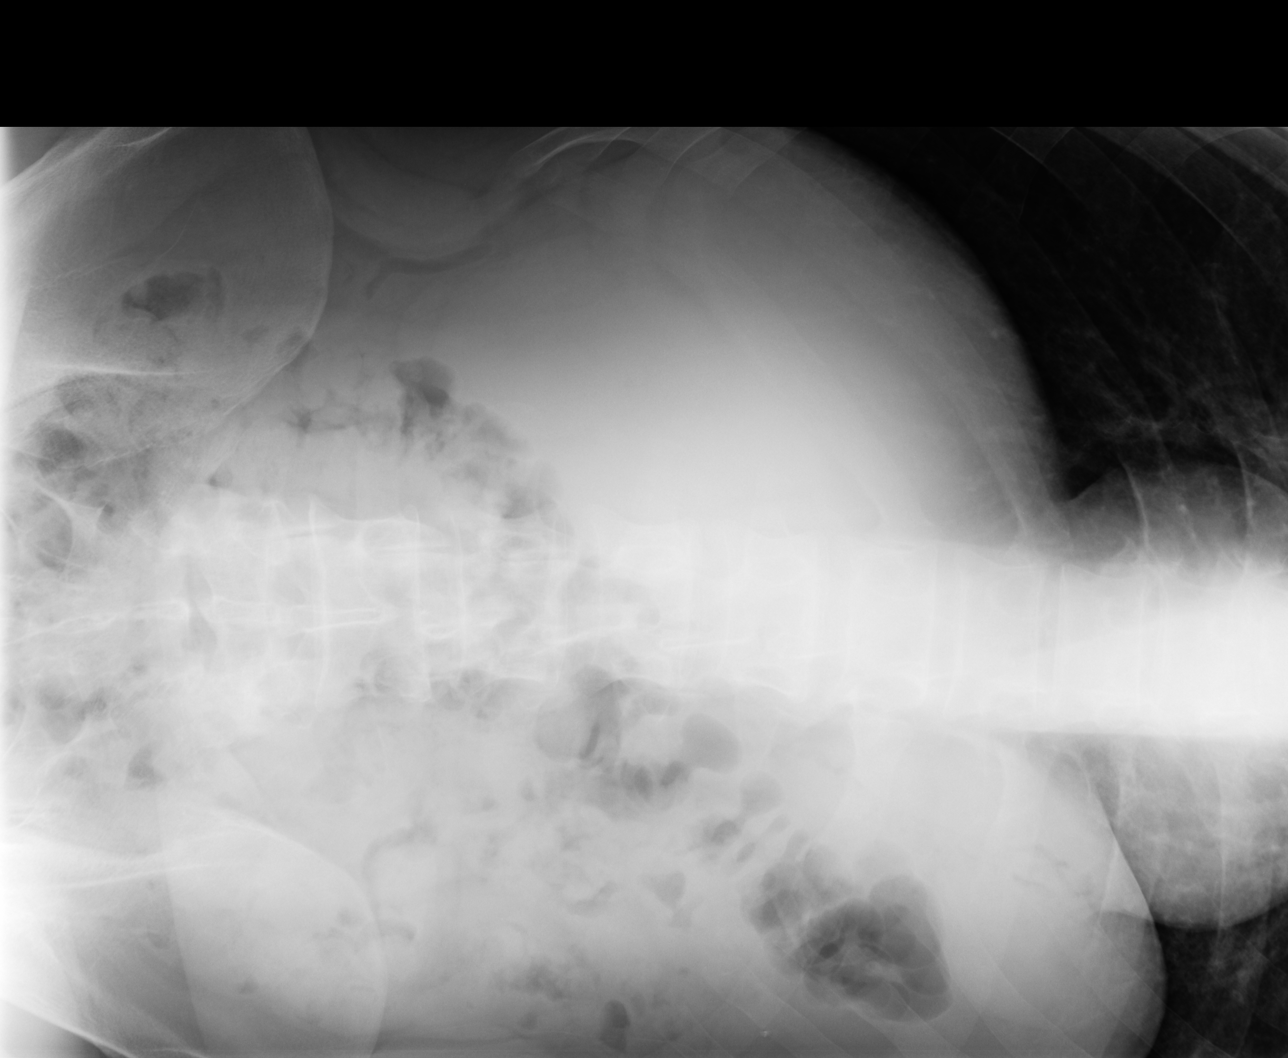

[4 of 4 positions shown; findings below may reference images not displayed]

PROCEDURE:     DXR - DXR ABDOMEN 3-WAY (INCL PA CXR)  - September 08, 2006  [DATE]

RESULT:     The present study is compared to the prior exam of 08/28/06.  The
gas pattern is nonspecific with no abnormal distention of large and/or small
bowel.  No free intraperitoneal air.  No abnormal calcifications.  The chest
film reveals the lung fields to be clear.
IMPRESSION: Nonspecific gas pattern.

## 2008-11-02 ENCOUNTER — Emergency Department: Payer: Self-pay | Admitting: Emergency Medicine

## 2008-11-03 ENCOUNTER — Emergency Department: Payer: Self-pay | Admitting: Emergency Medicine

## 2008-11-04 ENCOUNTER — Emergency Department: Payer: Self-pay | Admitting: Emergency Medicine

## 2008-11-26 ENCOUNTER — Emergency Department: Payer: Self-pay | Admitting: Emergency Medicine

## 2008-11-26 ENCOUNTER — Emergency Department: Payer: Self-pay | Admitting: Internal Medicine

## 2008-12-04 ENCOUNTER — Emergency Department: Payer: Self-pay | Admitting: Emergency Medicine

## 2008-12-12 IMAGING — CR DG CHEST 1V PORT
1 series · 1 of 1 positions shown · non-contrast
Comparison: none

REASON FOR EXAM: sz
COMMENTS:

[view not recorded]
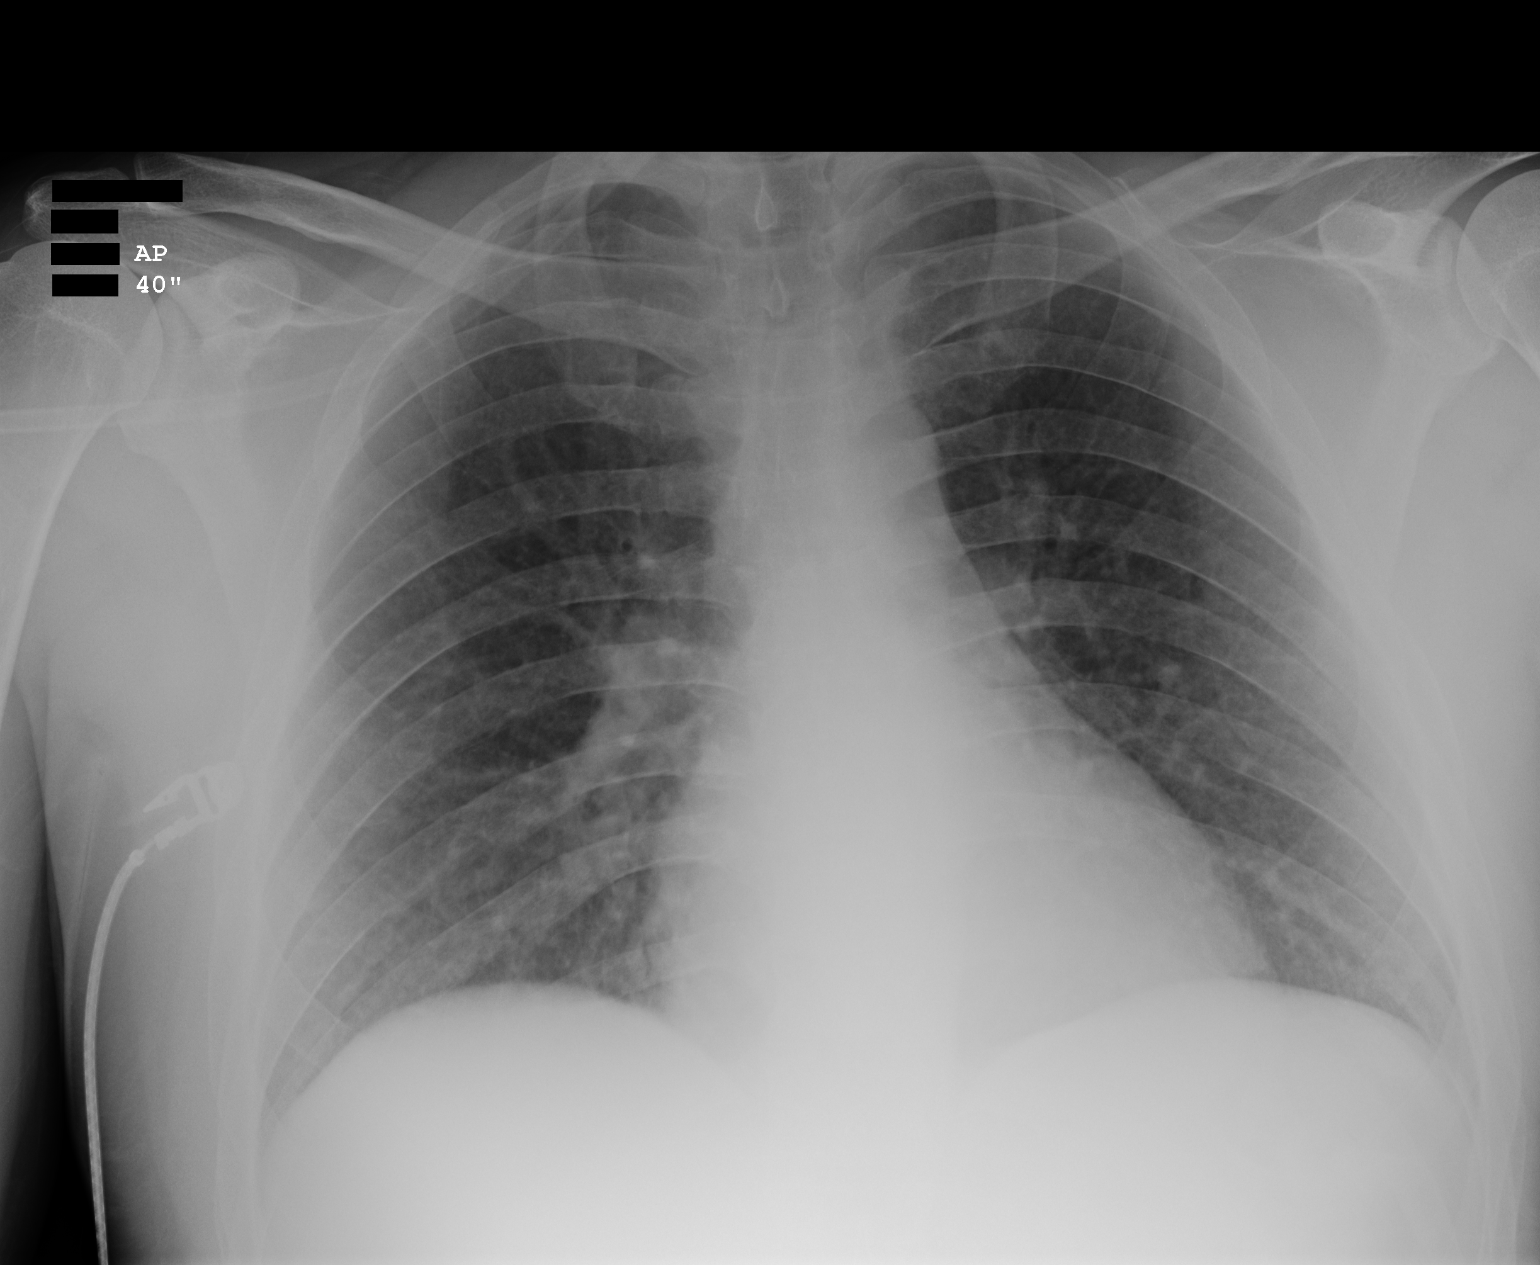

[1 of 1 positions shown; findings below may reference images not displayed]

PROCEDURE:     DXR - DXR PORTABLE CHEST SINGLE VIEW  - November 07, 2006 [DATE]

RESULT:     AP view of the chest shows the lung fields to be clear.  The
heart, mediastinal and osseous structures show no significant abnormalities
and reveal no significant interval changes as compared to the prior exam of
08/01/06.
IMPRESSION: No significant abnormalities are noted.

## 2008-12-12 IMAGING — CT CT HEAD WITHOUT CONTRAST
2 series · 16 of 30 positions shown, 20 images · non-contrast
Comparison: none

REASON FOR EXAM: seizures  rm 14
COMMENTS:

[Series 2: without · axial · non-contrast · 0.46mm/px · z∈[-152,-28]mm · 13 of 31 slices shown, 17 images]
[im 3/31  brain]
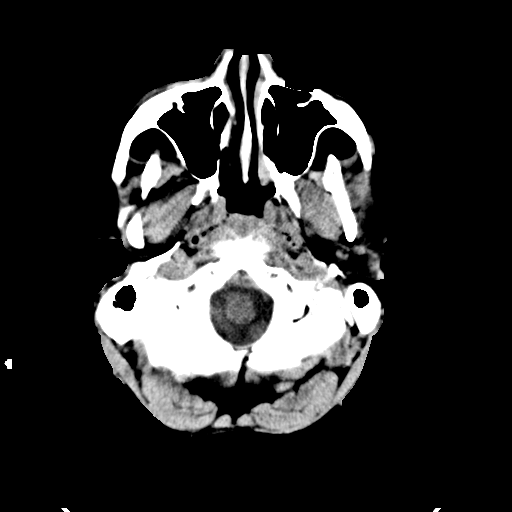
[im 3/31  bone]
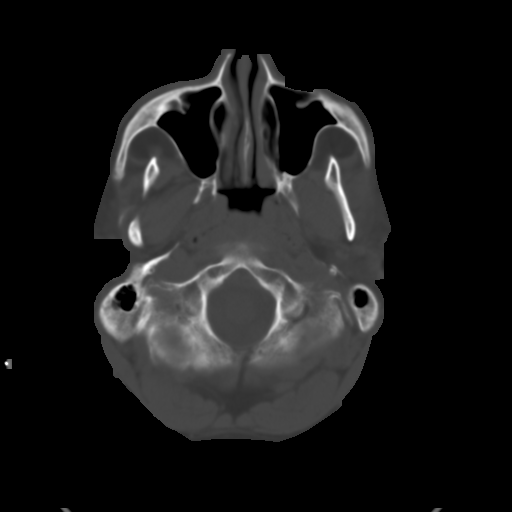
[im 5/31  brain]
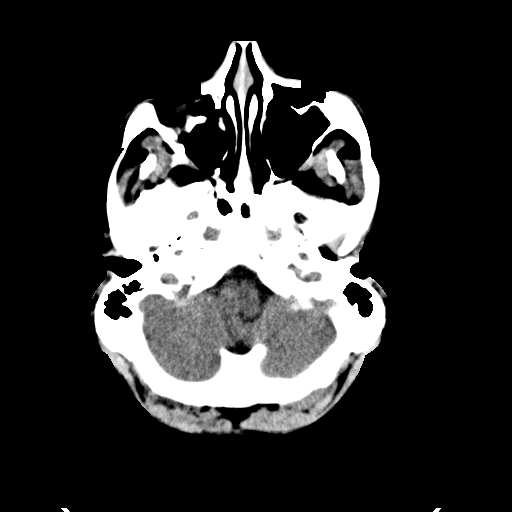
[im 7/31  brain]
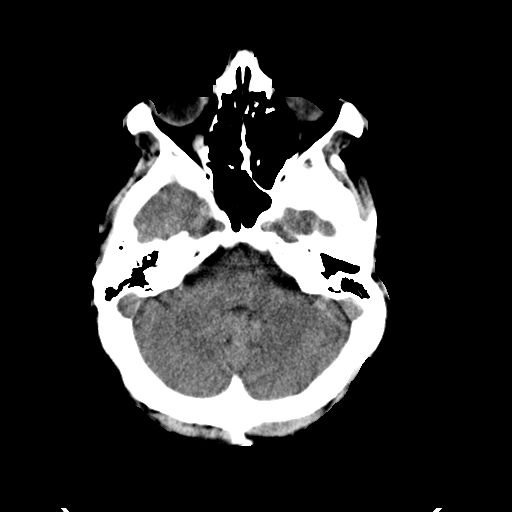
[im 9/31  brain]
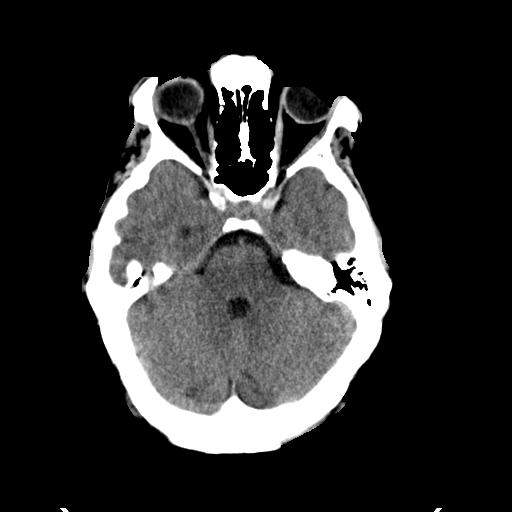
[im 11/31  brain]
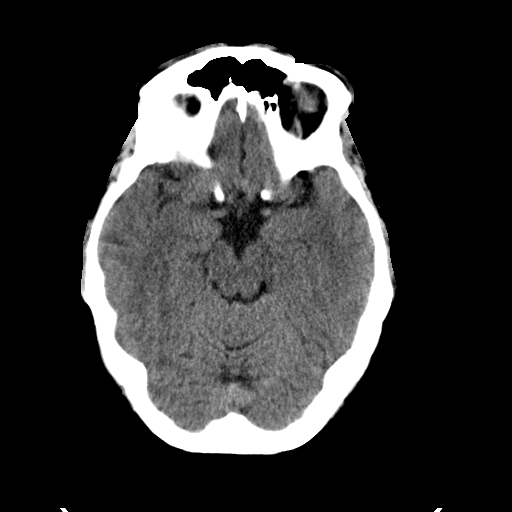
[im 11/31  bone]
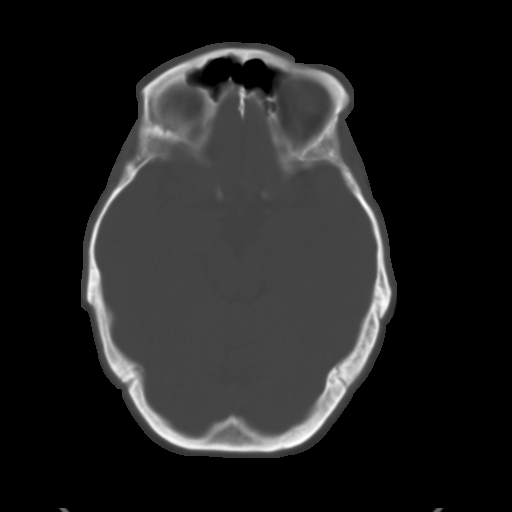
[im 13/31  brain]
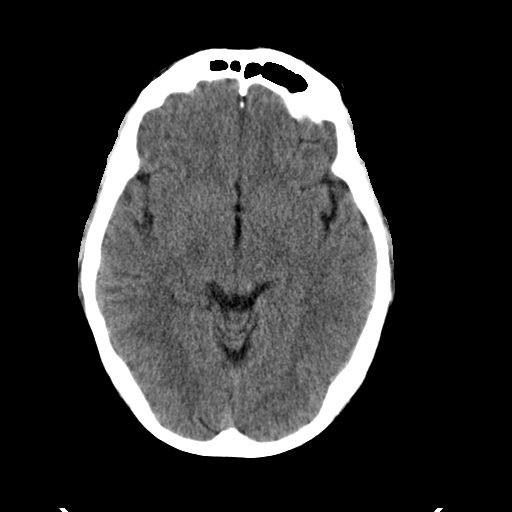
[im 16/31  brain]
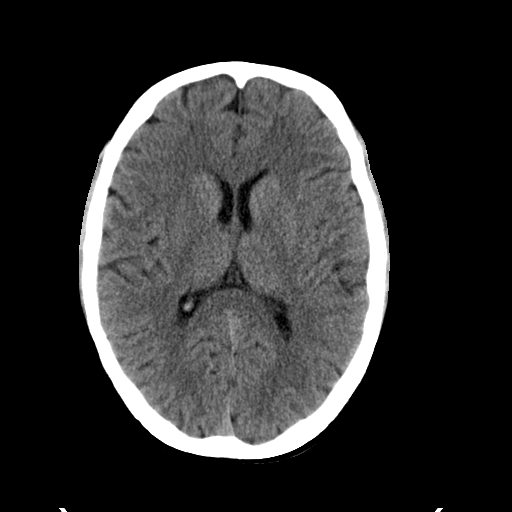
[im 18/31  brain]
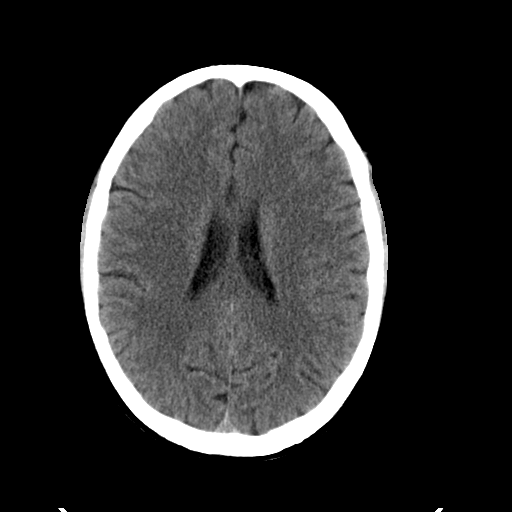
[im 20/31  brain]
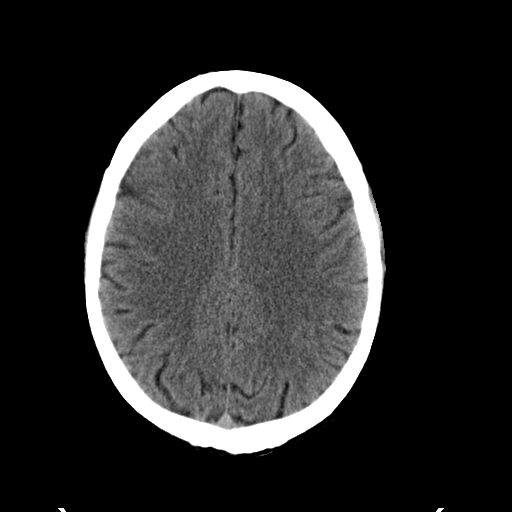
[im 20/31  bone]
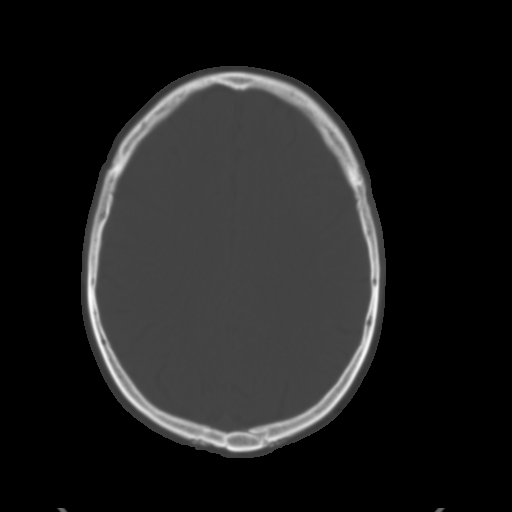
[im 22/31  brain]
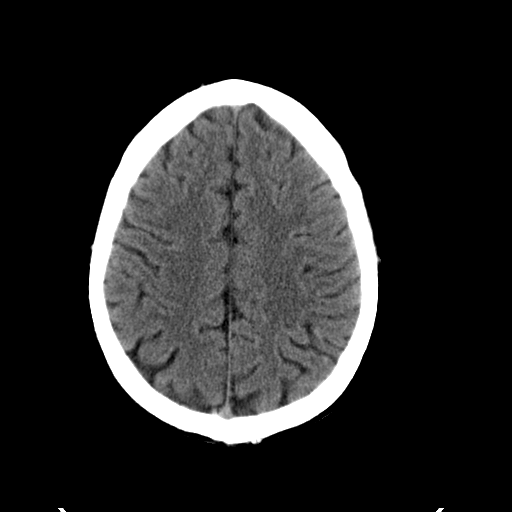
[im 24/31  brain]
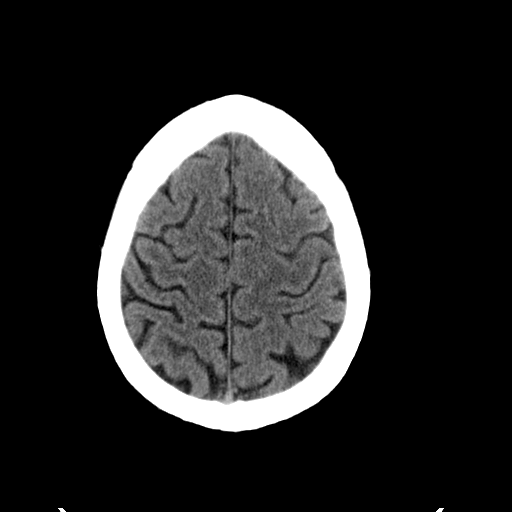
[im 26/31  brain]
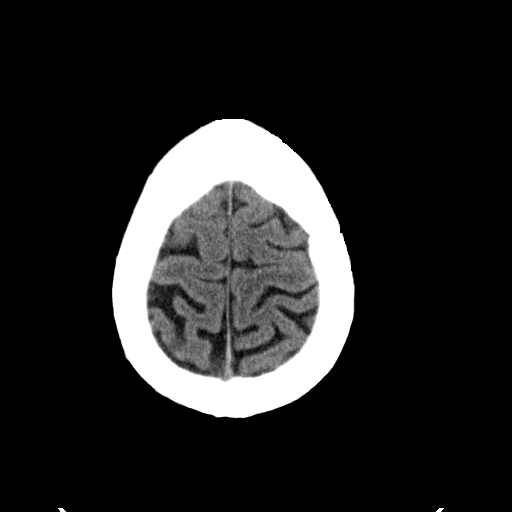
[im 28/31  brain]
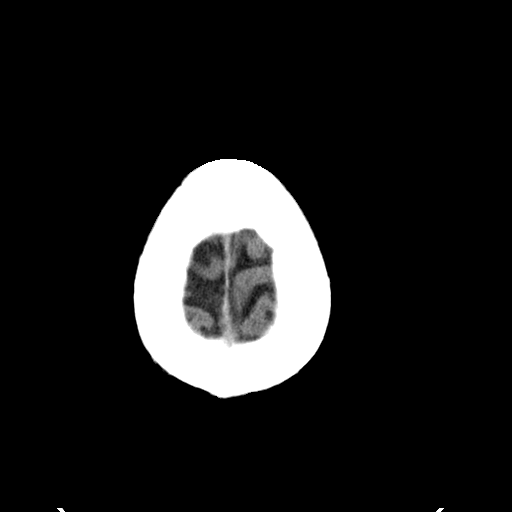
[im 28/31  bone]
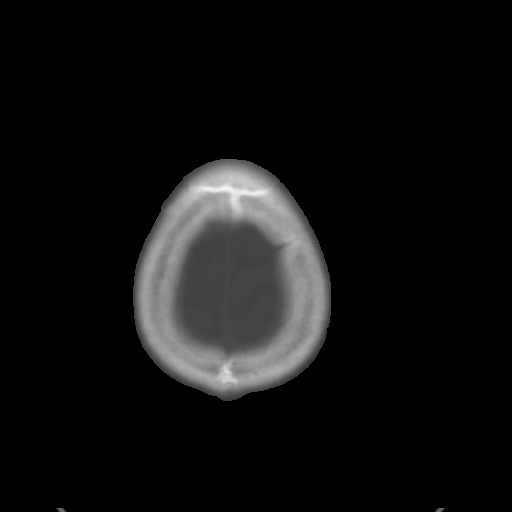

[Series 3: bone · axial · 0.46mm/px · z∈[-152,-112]mm · 3 of 31 slices shown]
[im 3/31  bone]
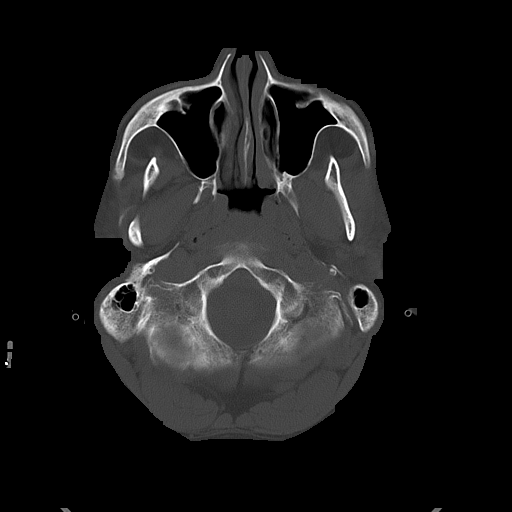
[im 7/31  bone]
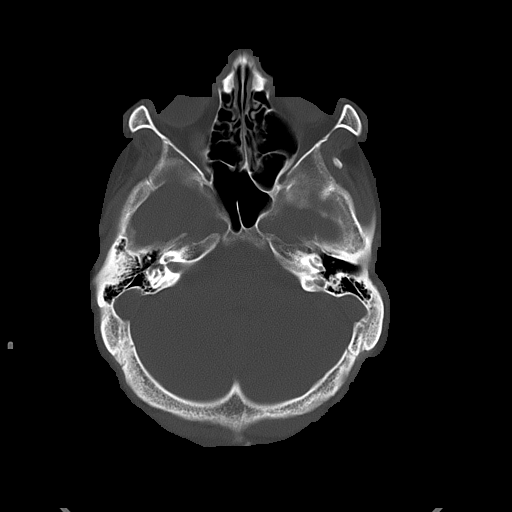
[im 11/31  bone]
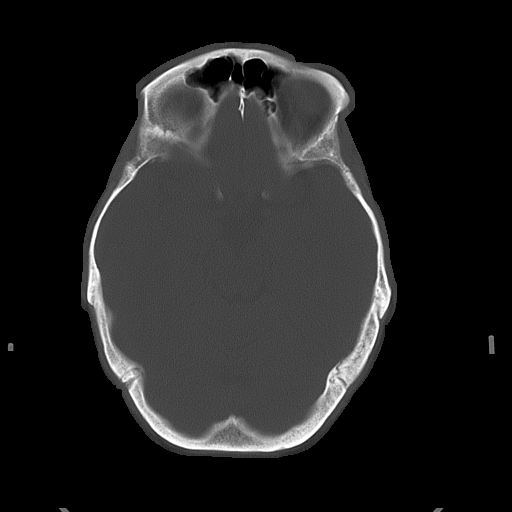

[16 of 30 positions shown; findings below may reference images not displayed]

PROCEDURE:     CT  - CT HEAD WITHOUT CONTRAST  - November 07, 2006 [DATE]

RESULT:     Comparison is made to the prior CT of 08-28-06.  The ventricles
and sulci appear to be within normal limits. There is no evidence of an area
of hemorrhage. There is no mass effect or midline shift. There is no focal
mass. There is no acute infarct. There is no interval change.
IMPRESSION: 1)No CT evidence of an acute intracranial abnormality. There is no skull
fracture. There is no evidence of sinusitis or mastoiditis.

## 2008-12-13 IMAGING — CT CT ABD-PELV W/ CM
1 of 2 series · 15 of 32 positions shown, 20 images · non-contrast
Comparison: none

REASON FOR EXAM: (1)  Left lower qt pain, chronic, pt admitted for
seizures; (2) same
COMMENTS:

PROCEDURE:     CT  - CT ABDOMEN / PELVIS  W  - November 08, 2006  [DATE]
RESULT:
REASON FOR CONSULTATION:    LEFT lower quadrant pain.
TECHNIQUE: Axial images were obtained from the hemidiaphragm to the pubic
symphysis after intravenous and oral administration of contrast material.

[Series 2: soft tissue · axial · 0.79mm/px · z∈[-686,-246]mm · 15 of 61 slices shown, 20 images]
[im 3/61  soft-tissue]
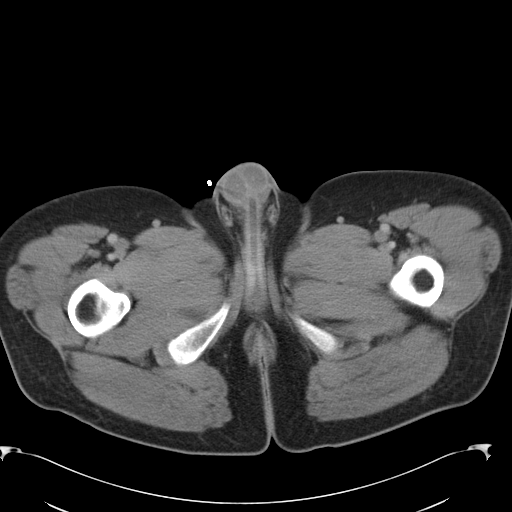
[im 3/61  bone]
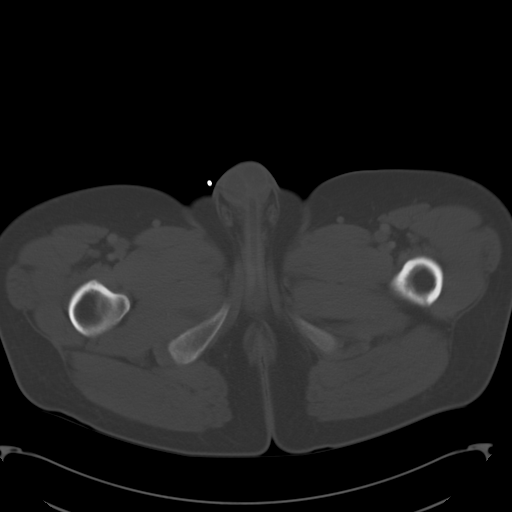
[im 8/61  soft-tissue]
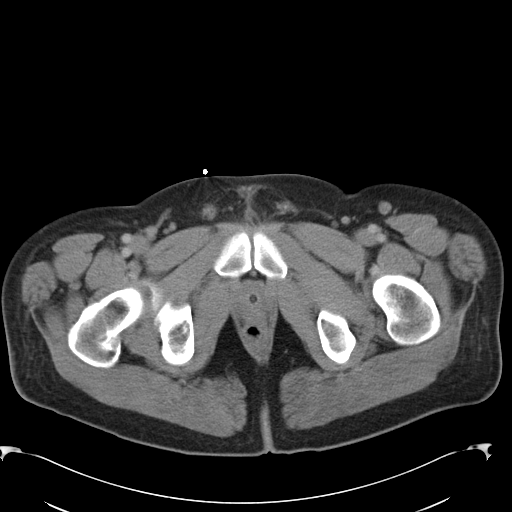
[im 11/61  soft-tissue]
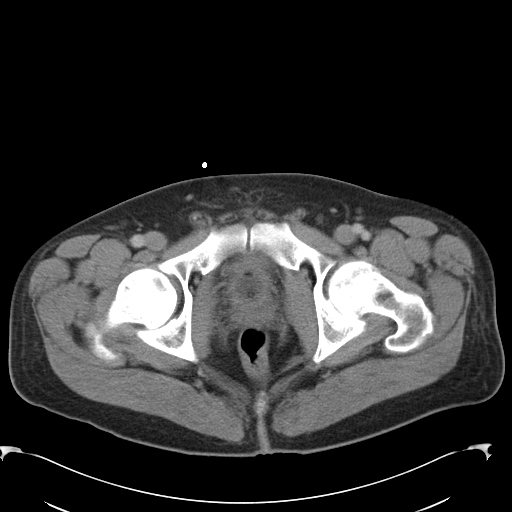
[im 16/61  soft-tissue]
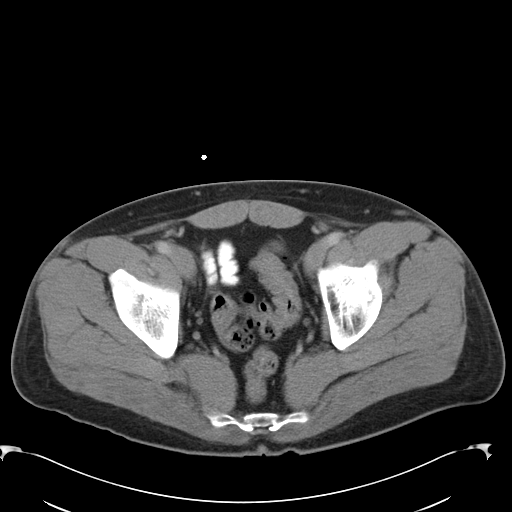
[im 21/61  soft-tissue]
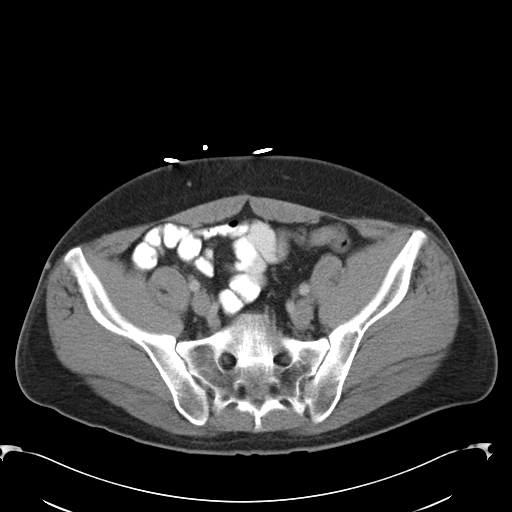
[im 24/61  soft-tissue]
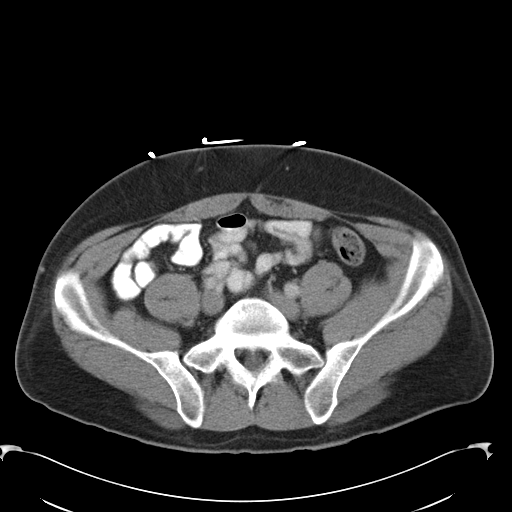
[im 29/61  soft-tissue]
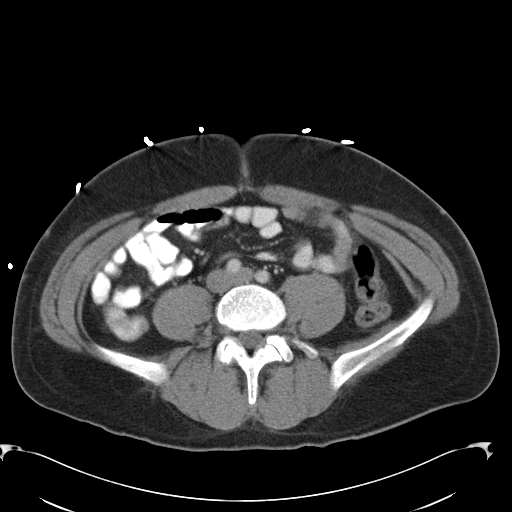
[im 32/61  soft-tissue]
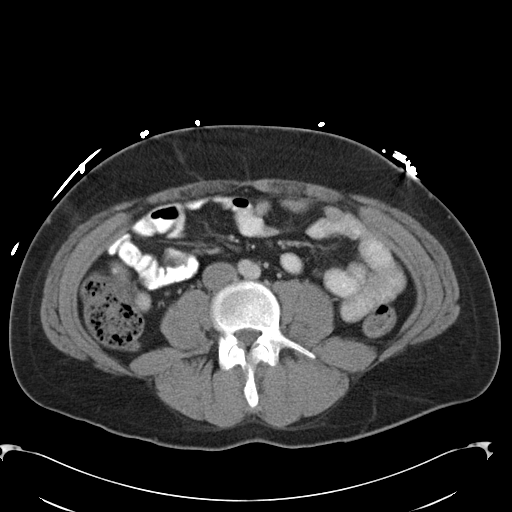
[im 37/61  soft-tissue]
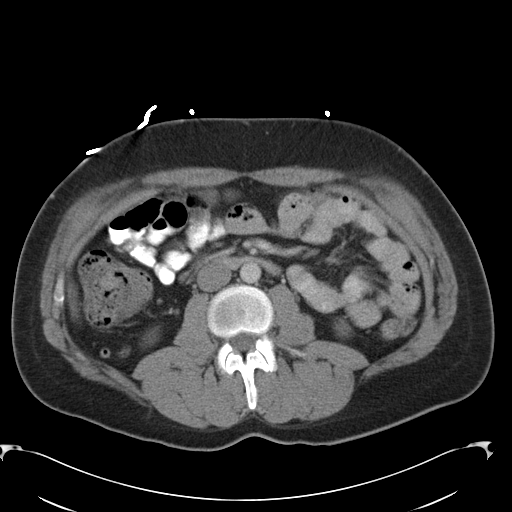
[im 37/61  bone]
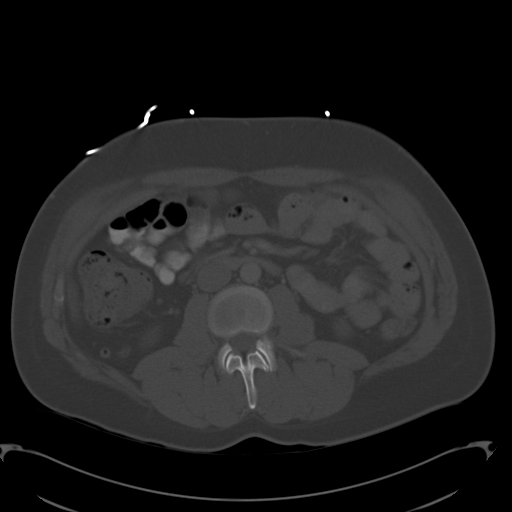
[im 40/61  soft-tissue]
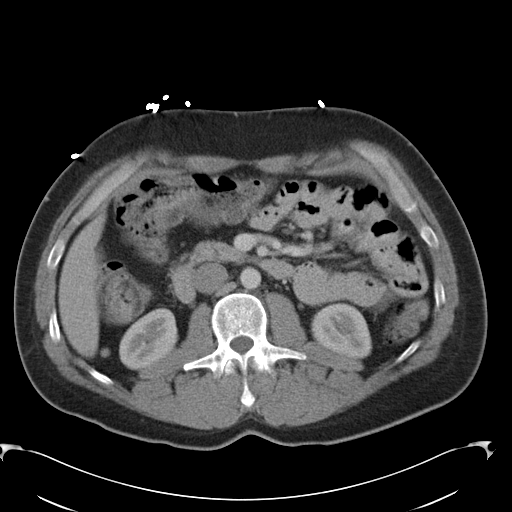
[im 45/61  soft-tissue]
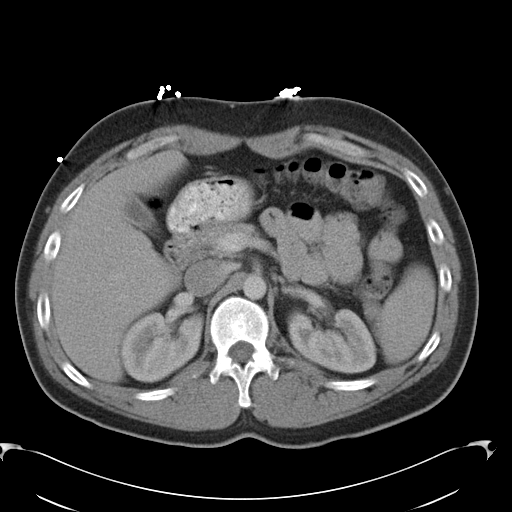
[im 50/61  soft-tissue]
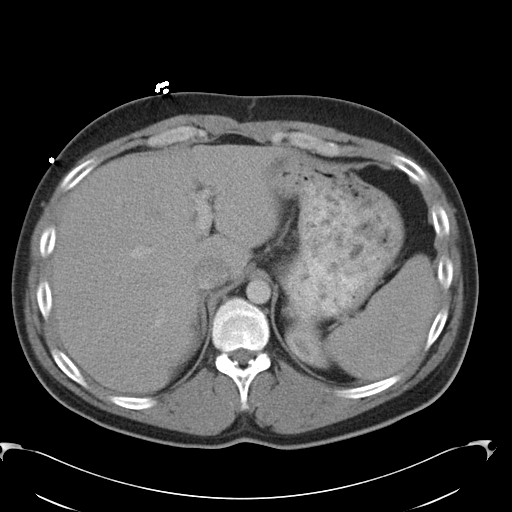
[im 50/61  lung]
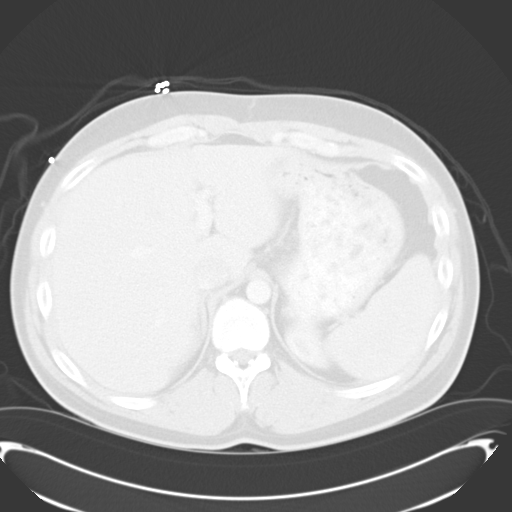
[im 53/61  soft-tissue]
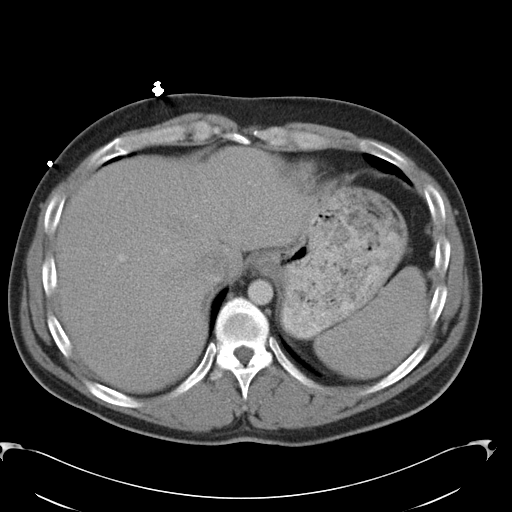
[im 53/61  lung]
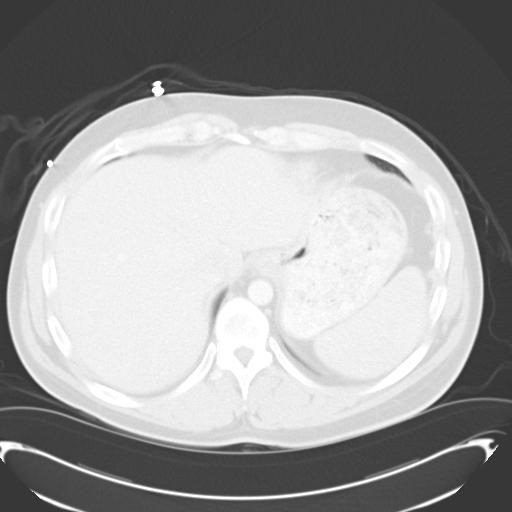
[im 55/61  lung]
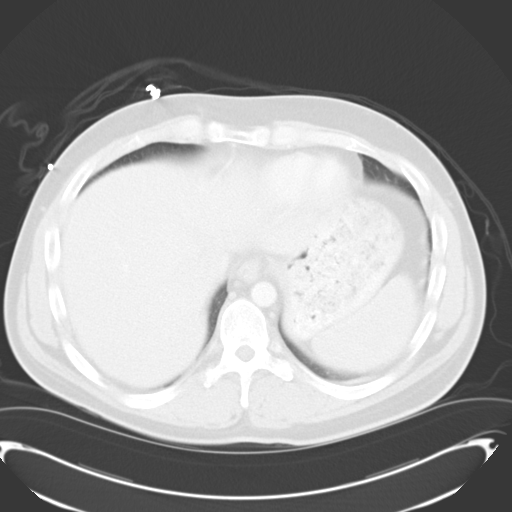
[im 58/61  soft-tissue]
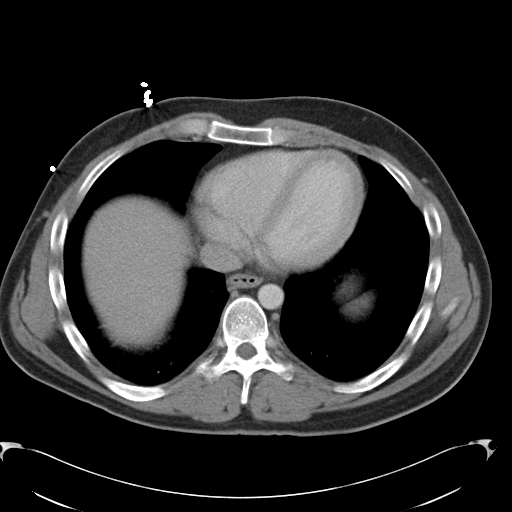
[im 58/61  lung]
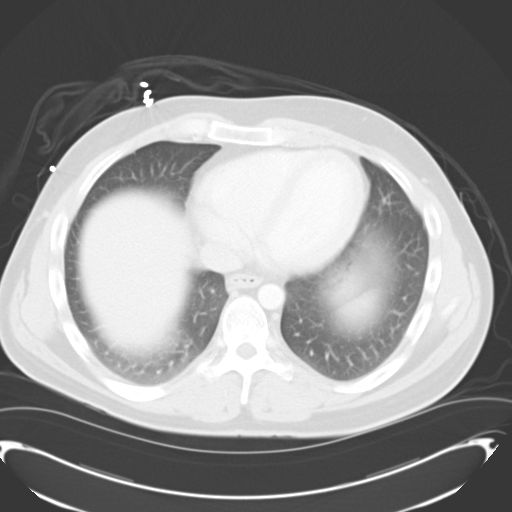

[15 of 32 positions shown; findings below may reference images not displayed]

FINDINGS: The liver and spleen appear intact.  Both kidneys excrete the
contrast.  No mass is seen.  No evidence of appendicitis.  No ascites noted
within the abdomen and/or pelvis.

There is noted diverticulosis in the sigmoid colon region.  No evidence of
diverticulitis.  No pelvic or retroperitoneal adenopathy is identified.

In the images through the lung bases, the lung bases are clear with no
effusions.
IMPRESSION: Diverticulosis without evidence of diverticulitis.  No additional
abnormalities seen on the abdominal/pelvic CT.

## 2009-02-12 IMAGING — CT CT HEAD WITHOUT CONTRAST
2 series · 16 of 30 positions shown, 20 images · non-contrast
Comparison: none

REASON FOR EXAM: Seizure, headache, vomiting today
COMMENTS:

[Series 2: without · axial · non-contrast · 0.40mm/px · z∈[-620,-490]mm · 13 of 32 slices shown, 17 images]
[im 3/32  brain]
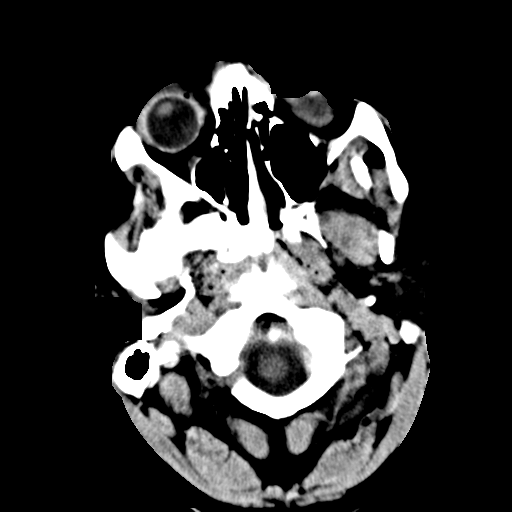
[im 3/32  bone]
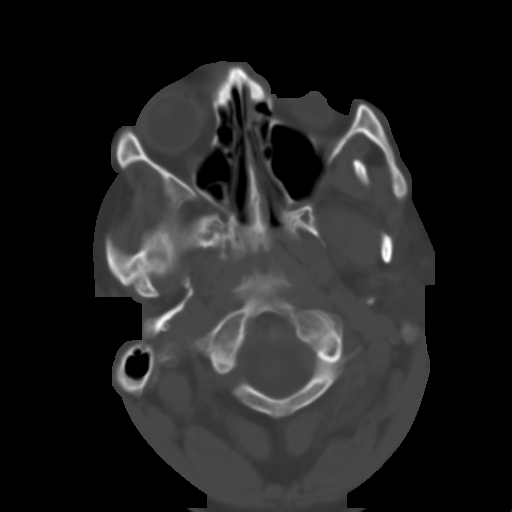
[im 5/32  brain]
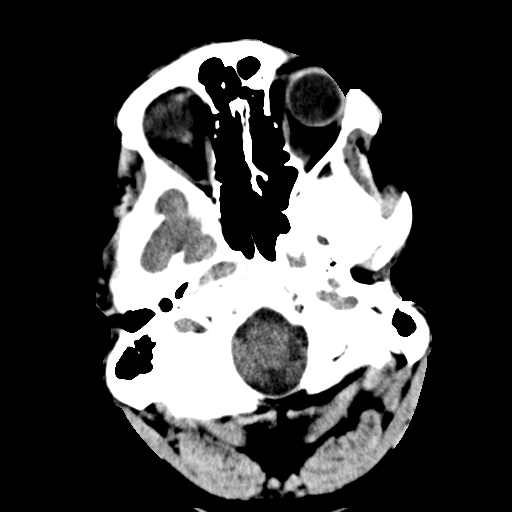
[im 7/32  brain]
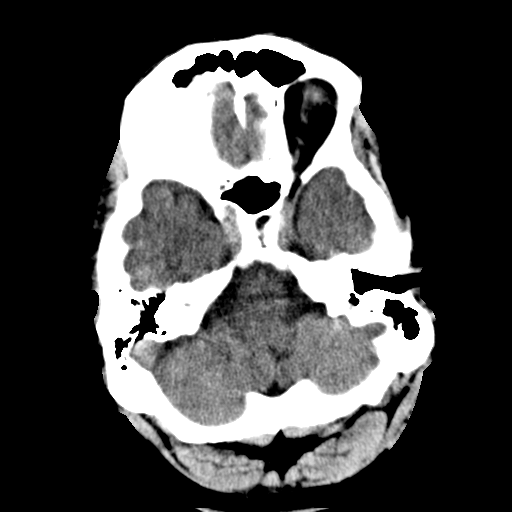
[im 9/32  brain]
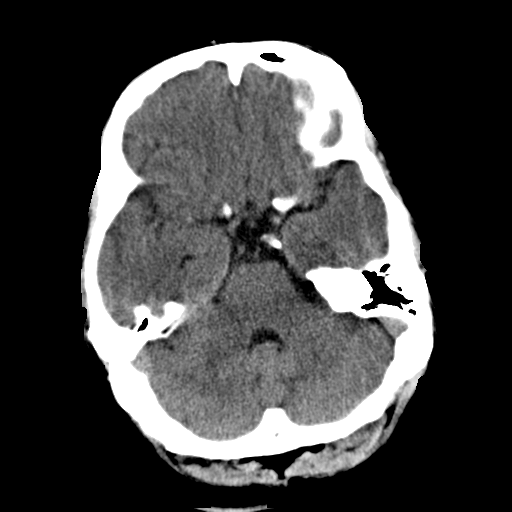
[im 12/32  brain]
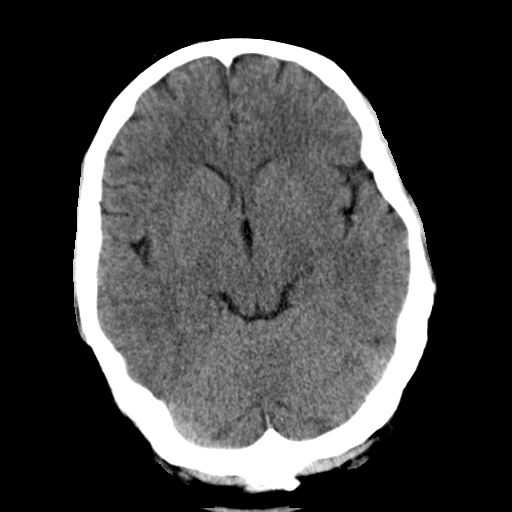
[im 12/32  bone]
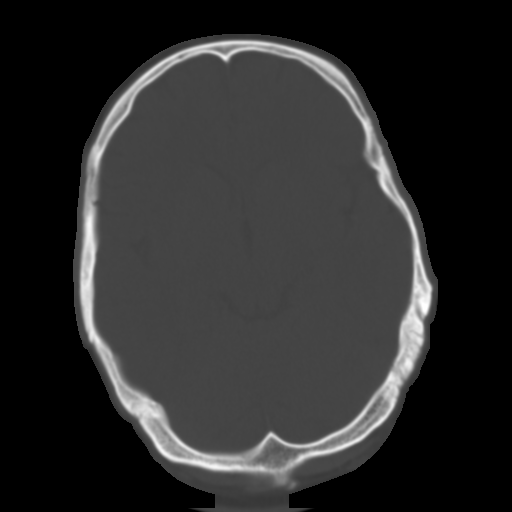
[im 14/32  brain]
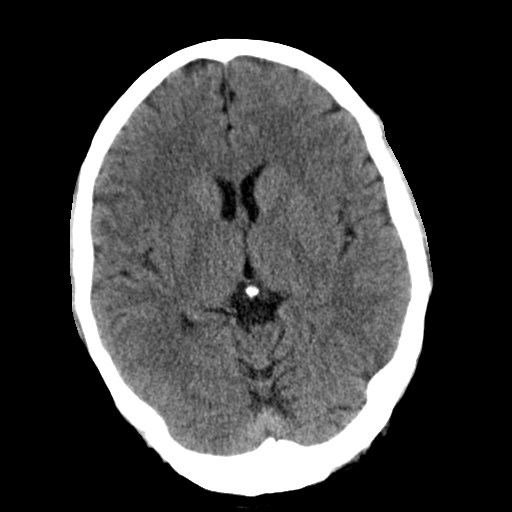
[im 16/32  brain]
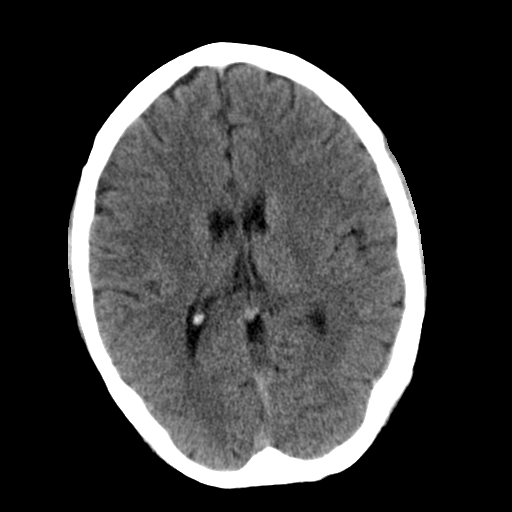
[im 18/32  brain]
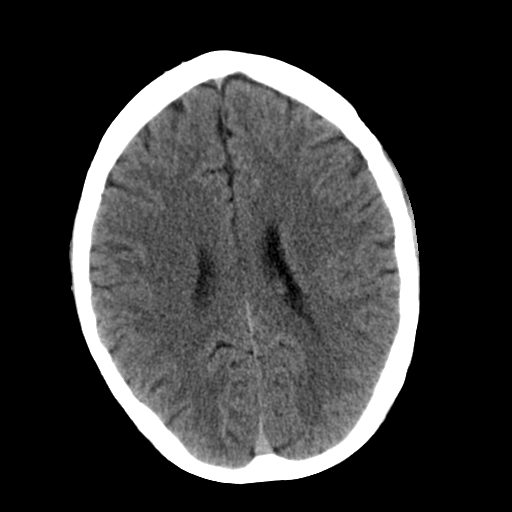
[im 20/32  brain]
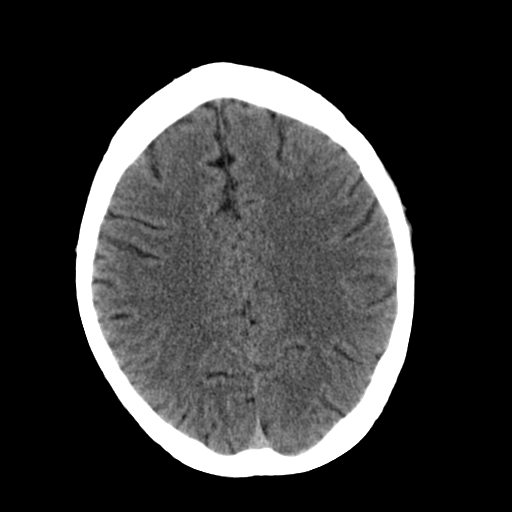
[im 20/32  bone]
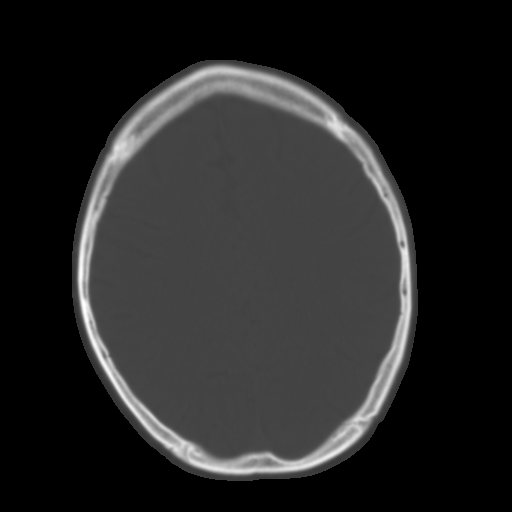
[im 23/32  brain]
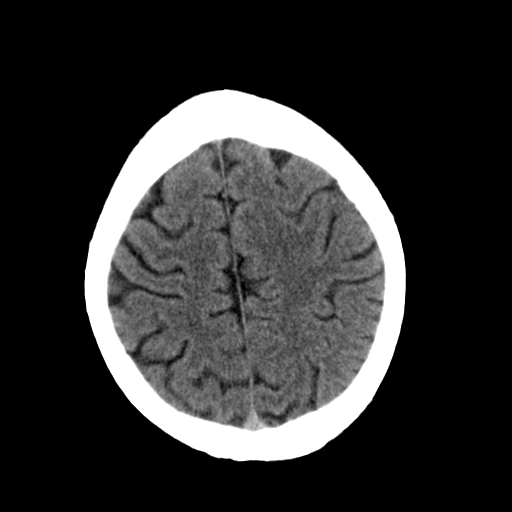
[im 25/32  brain]
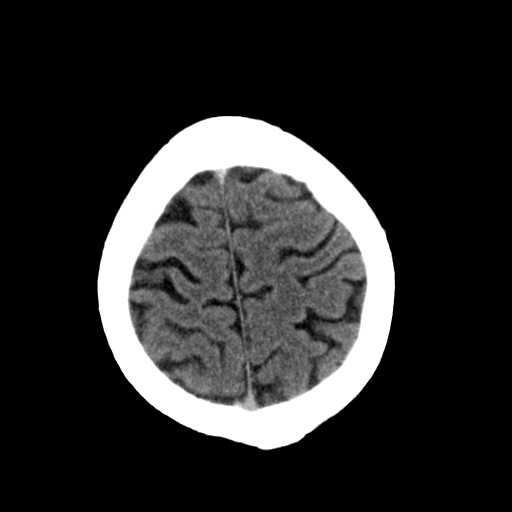
[im 27/32  brain]
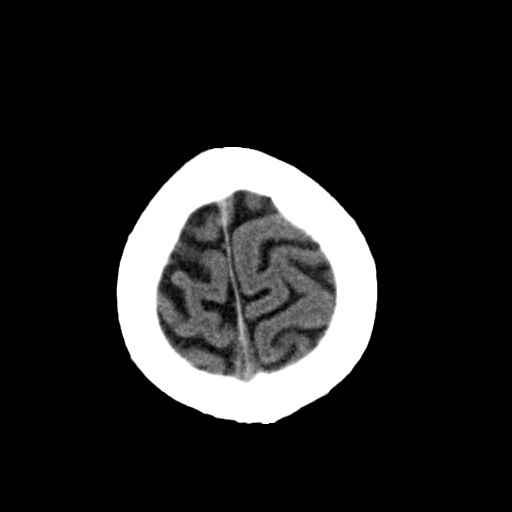
[im 29/32  brain]
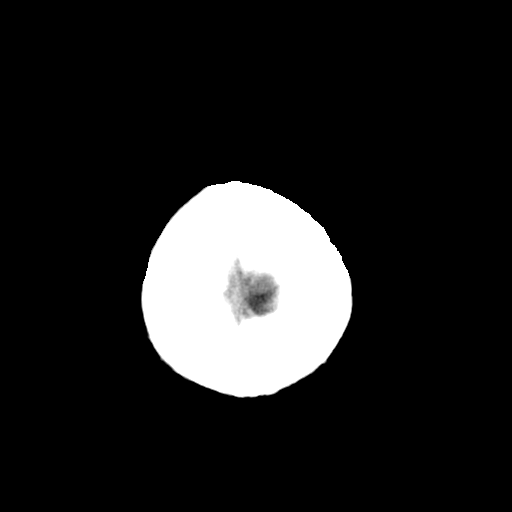
[im 29/32  bone]
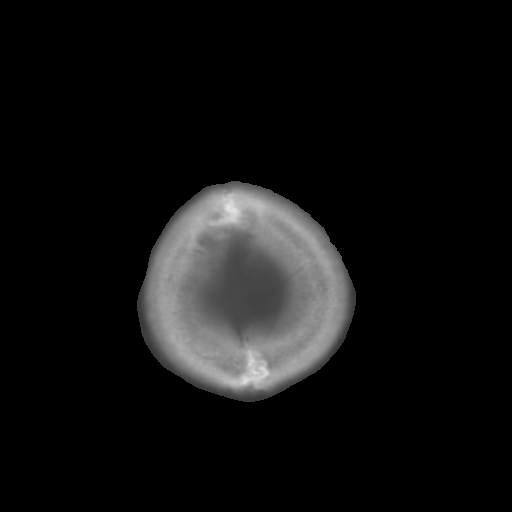

[Series 3: bone · axial · 0.40mm/px · z∈[-620,-575]mm · 3 of 32 slices shown]
[im 3/32  bone]
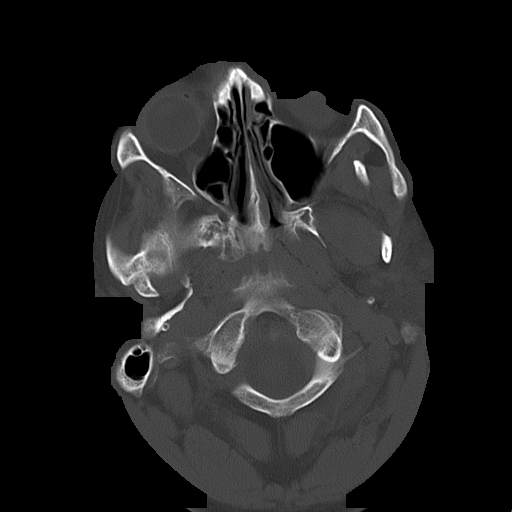
[im 7/32  bone]
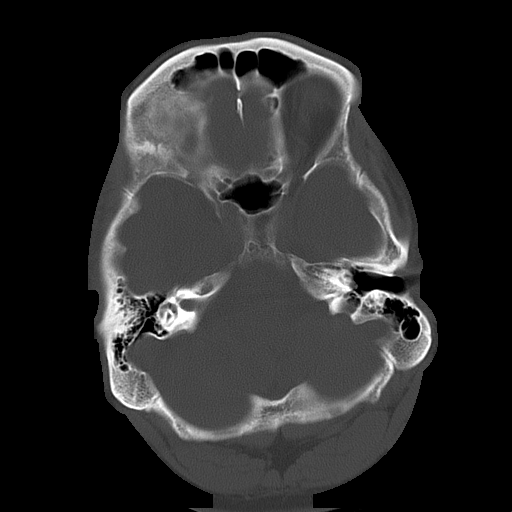
[im 12/32  bone]
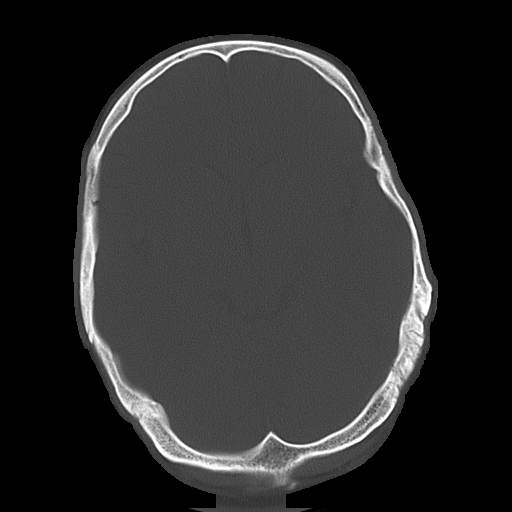

[16 of 30 positions shown; findings below may reference images not displayed]

PROCEDURE:     CT  - CT HEAD WITHOUT CONTRAST  - January 08, 2007  [DATE]

RESULT:       Unenhanced emergent Head CT was performed for seizures and
headaches.  No subarachnoid hemorrhage.  No intracerebral bleeds and no mass
effect.  No shift of the midline.  The ventricles appear within normal
limits.  No extraaxial fluid collections.

On the bone window settings, the sinuses and mastoids appear clear. No
osseous abnormalities are seen.
IMPRESSION: 1.     No acute findings identified.
2.     Report was called to the Emergency Room at the conclusion of
dictation.

## 2009-03-07 IMAGING — CR DG CHEST 2V
2 series · 2 of 2 positions shown · non-contrast
Comparison: None

CLINICAL DATA: Difficulty breathing 
 CHEST - 2 VIEW:

[view not recorded (1 of 2)]
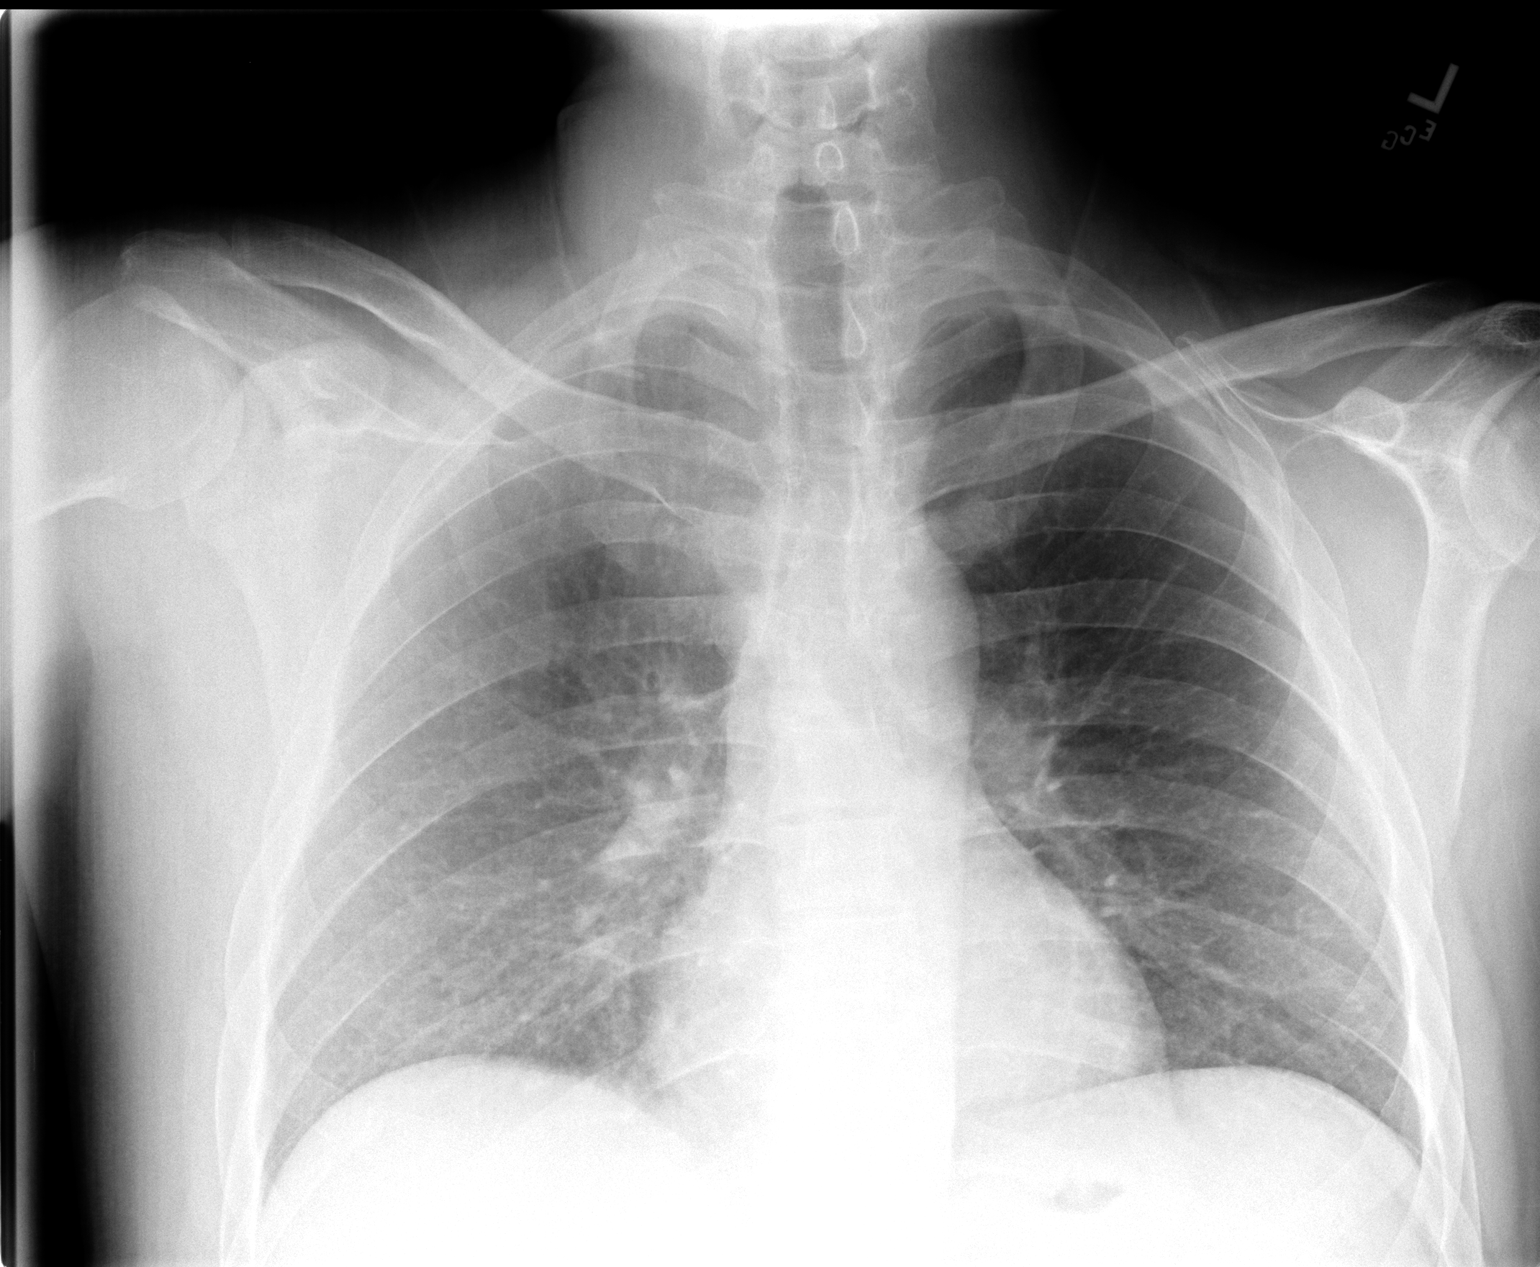

[view not recorded (2 of 2)]
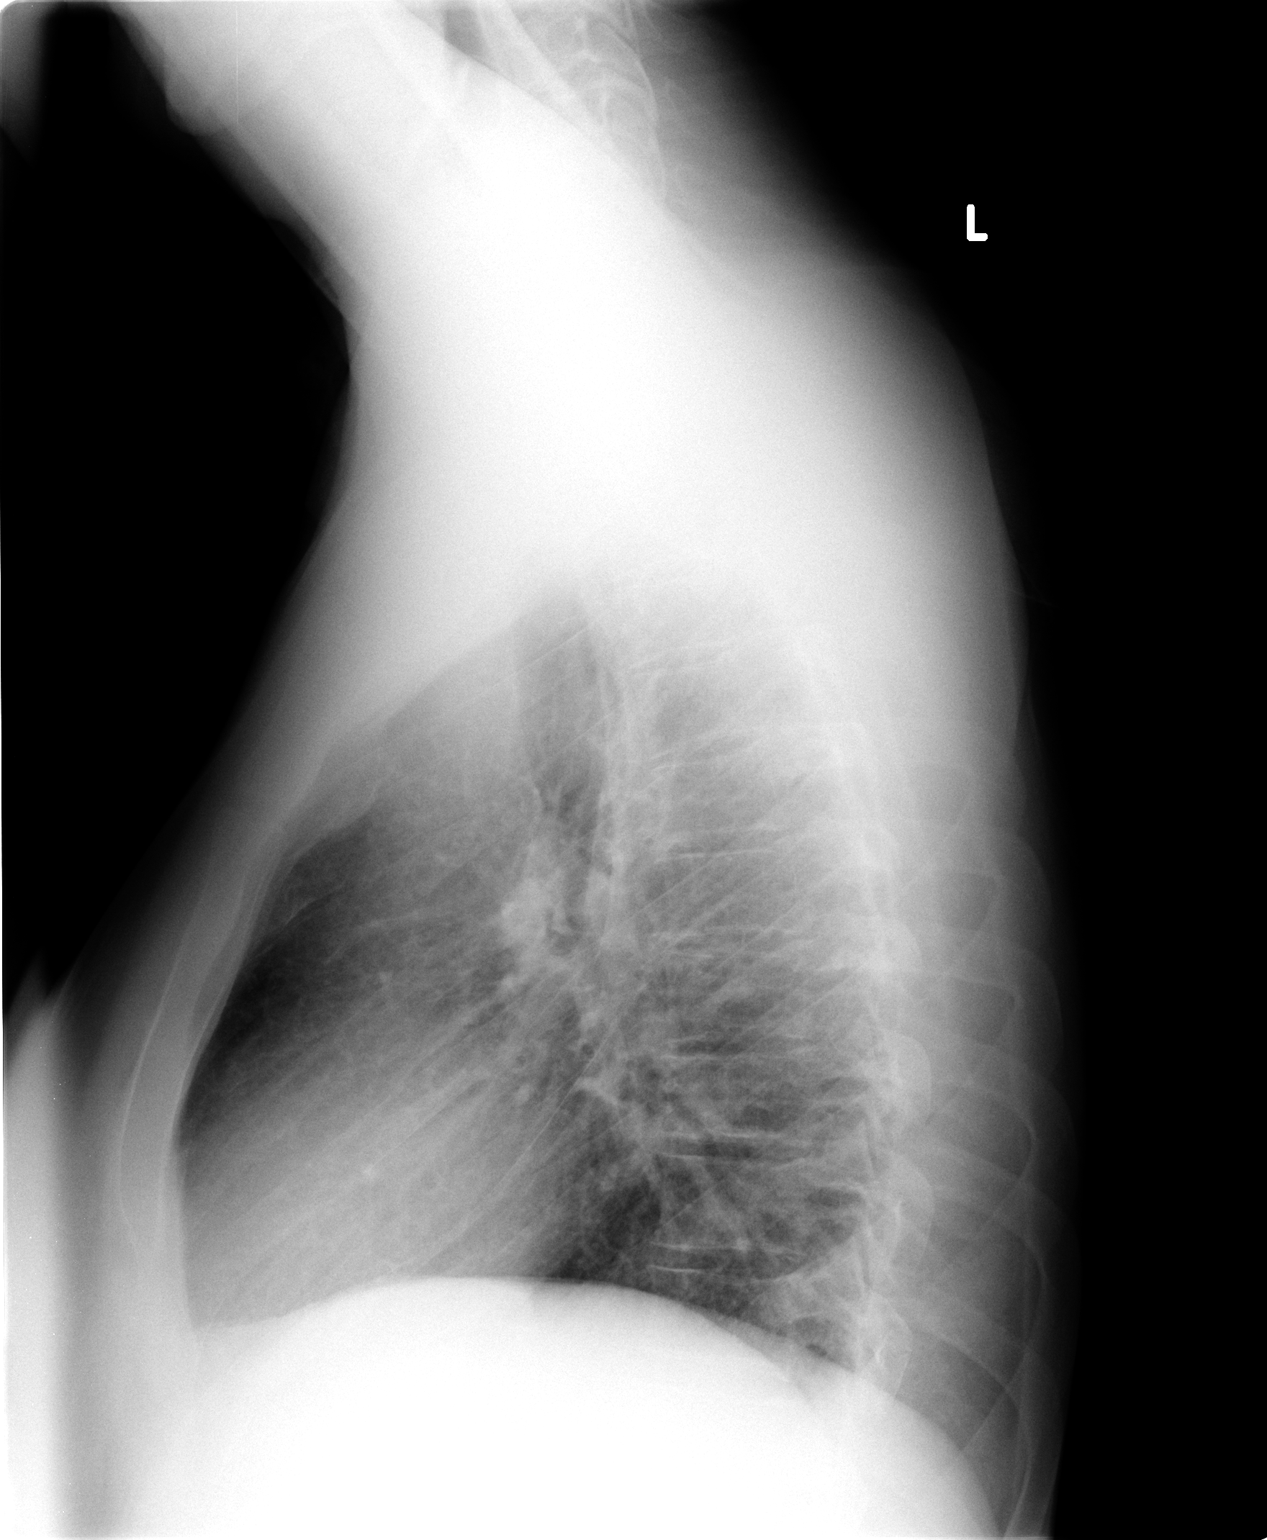

[2 of 2 positions shown; findings below may reference images not displayed]

FINDINGS: Mild diffuse interstitial prominence present without overt edema or focal infiltrate.  No pleural effusions.  Heart size is normal.
IMPRESSION: Mild diffuse interstitial prominence which is likely chronic.

## 2009-06-07 IMAGING — CR DG HAND COMPLETE 3+V*L*
1 series · 3 of 3 positions shown · non-contrast
Comparison: none

REASON FOR EXAM: pain
COMMENTS:

[Series 1: view not recorded · 0.17mm/px · 3 of 3 slices shown]
[im 1/3]
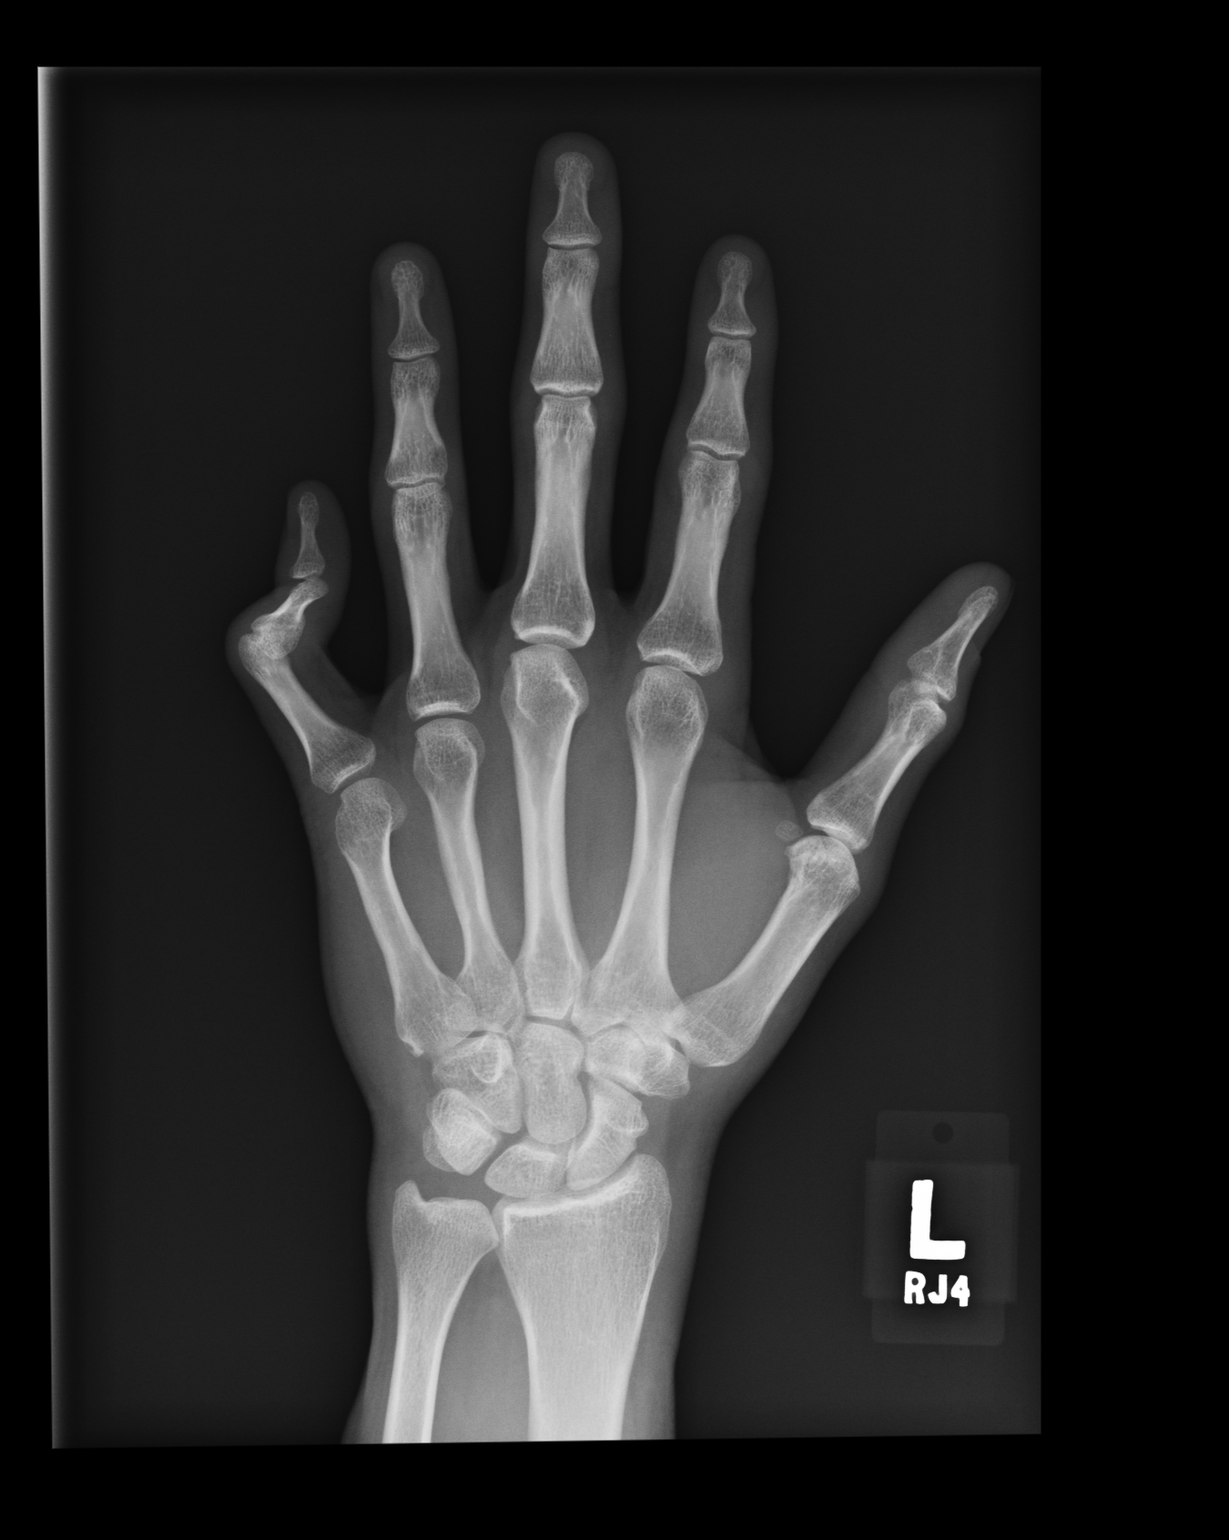
[im 2/3]
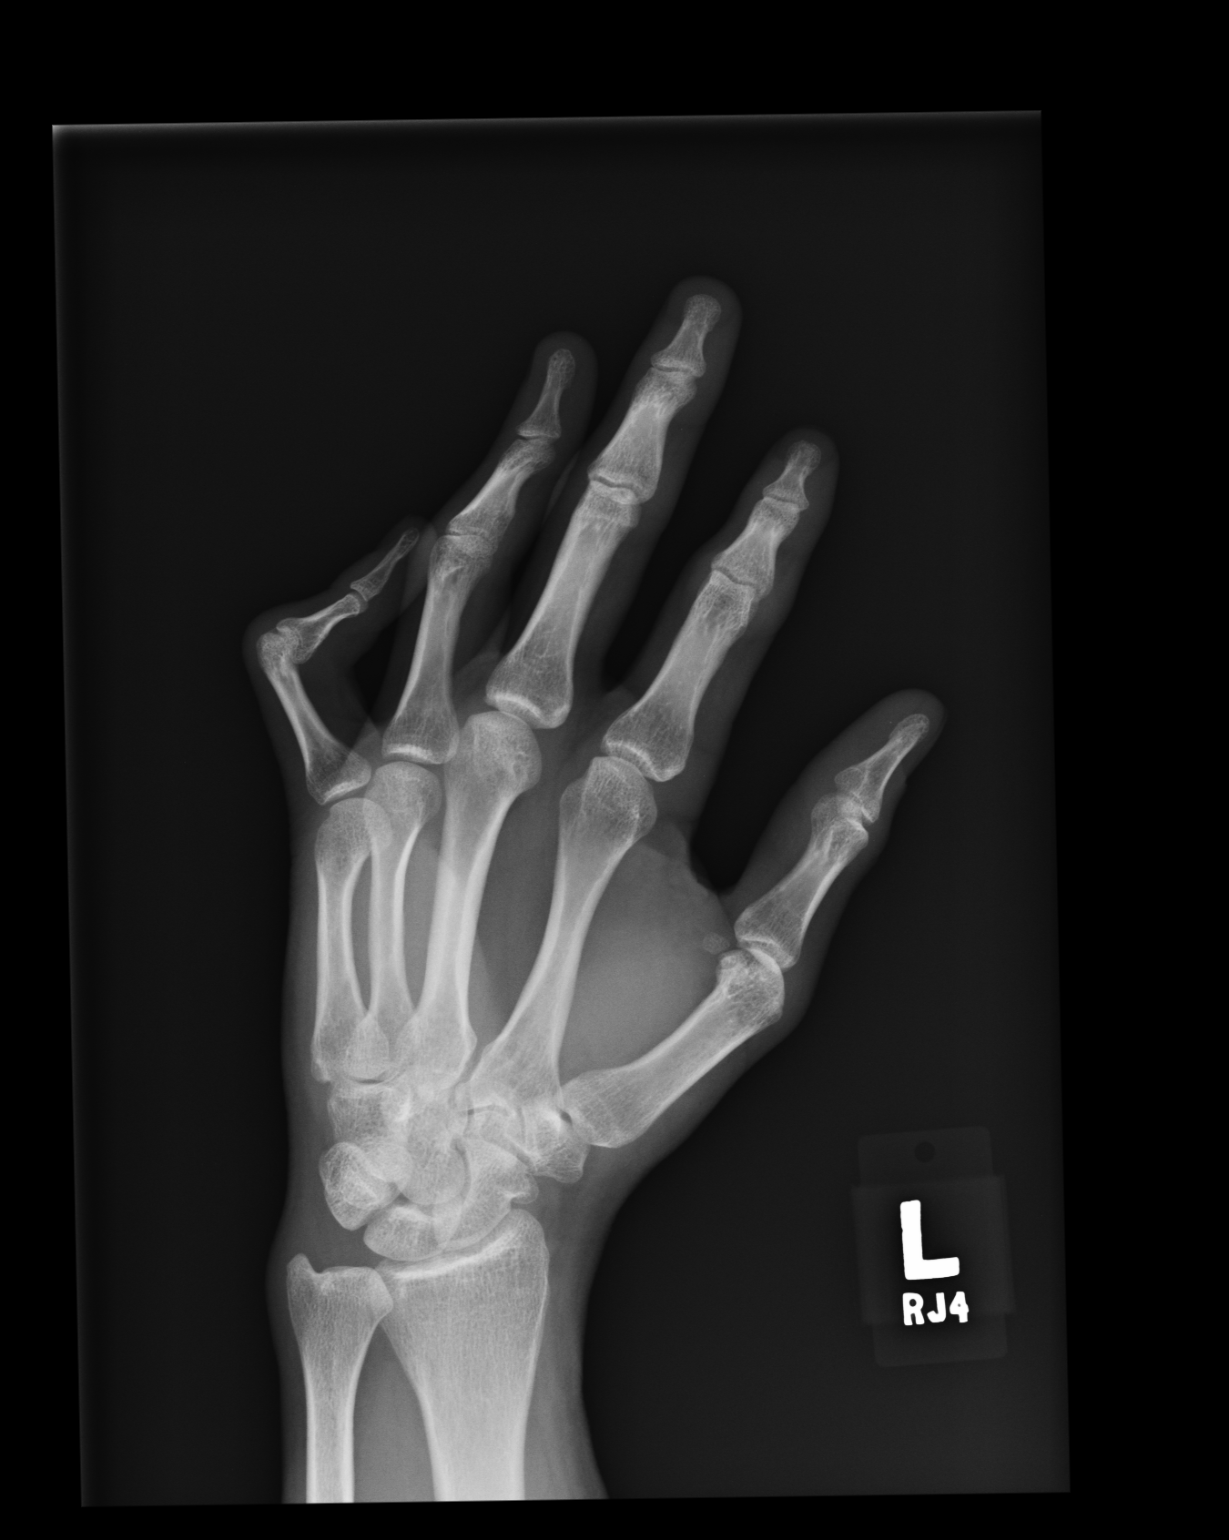
[im 3/3]
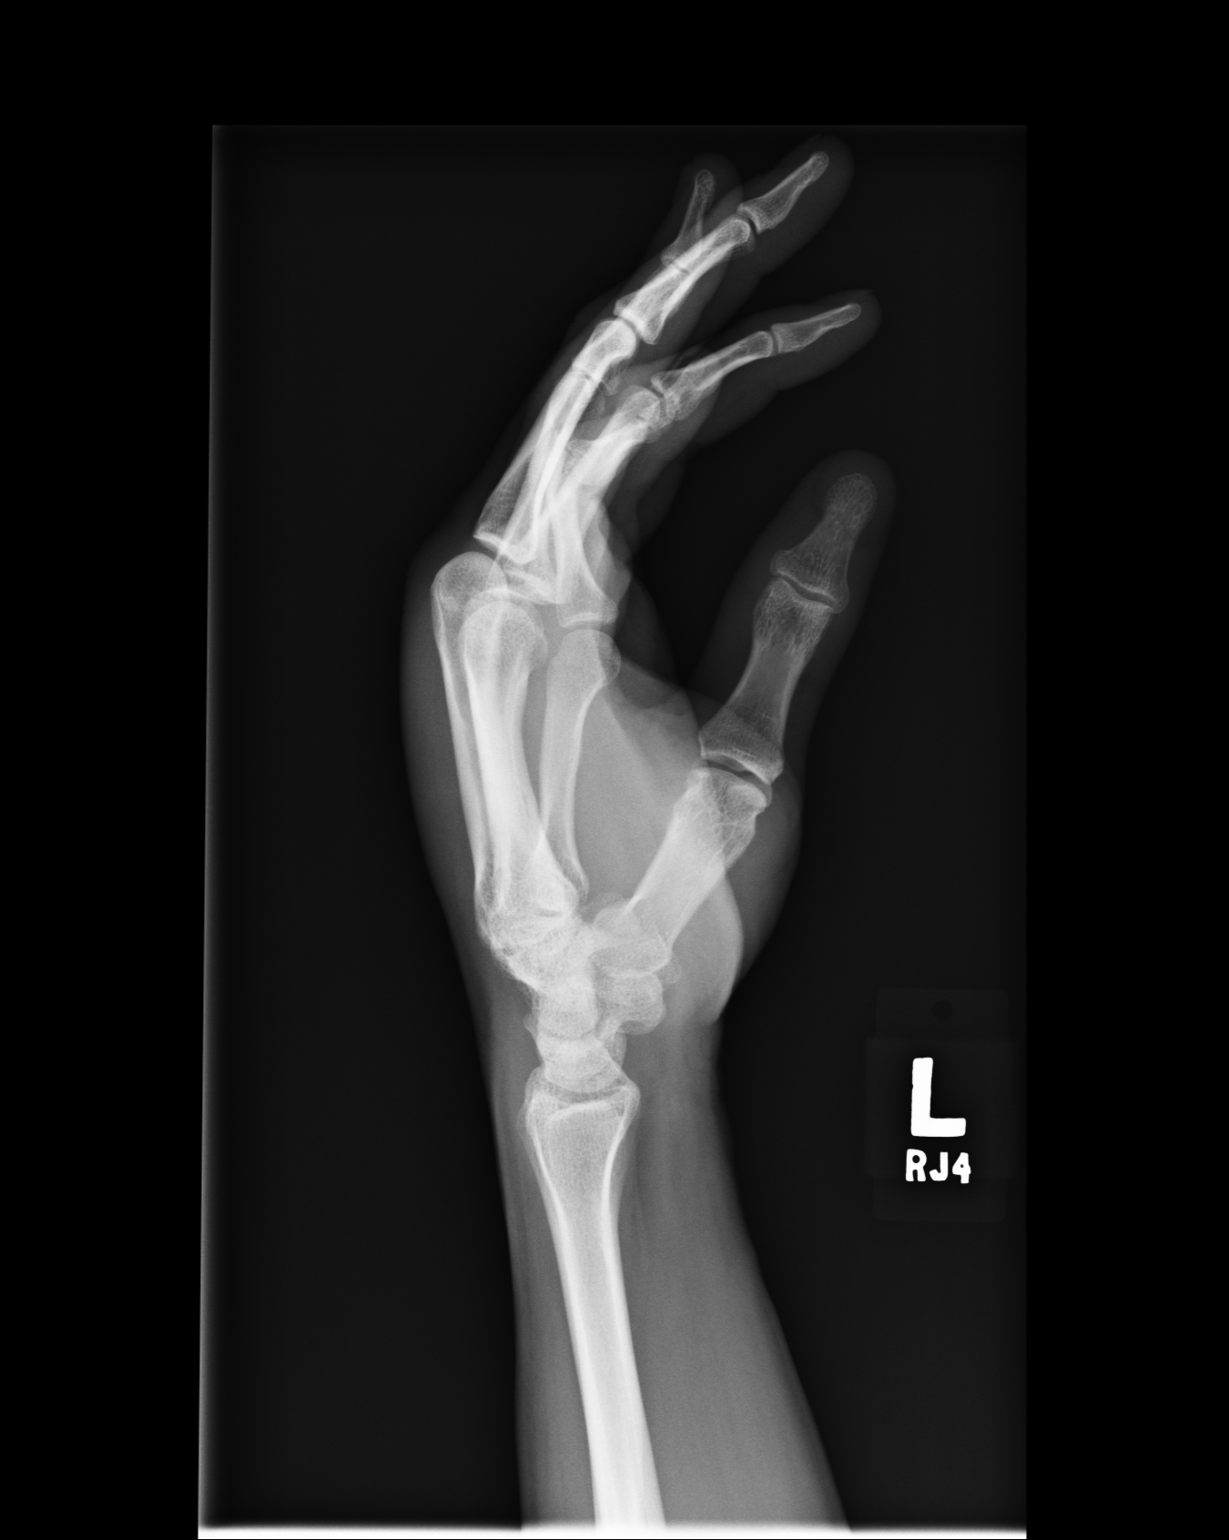

[3 of 3 positions shown; findings below may reference images not displayed]

PROCEDURE:     DXR - DXR HAND LT COMPLETE  W/OBLIQUES  - May 03, 2007 [DATE]

RESULT:     No fracture or dislocation is seen. There is observed flexion at
the PIP joint of the fifth finger on all views. The etiology for this
persistent flexion is not identified but no acute fracture is seen in the
region.
IMPRESSION: 1.     No acute bony abnormalities are identified.

## 2009-06-21 IMAGING — CR RIGHT RING FINGER 2+V
1 series · 3 of 3 positions shown · non-contrast
Comparison: none

REASON FOR EXAM: injury        rm-9
COMMENTS:

PROCEDURE:     DXR - DXR FINGER RING 4TH DIGIT RT UNIQUE  - May 17, 2007  [DATE]
RESULT:     No fracture or dislocation is seen. No radiodense soft tissue
foreign body is observed. The PIP joint is in flexion consistent with tendon
injury as mentioned in the clinical history.

[Series 1: view not recorded · 0.17mm/px · 3 of 3 slices shown]
[im 1/3]
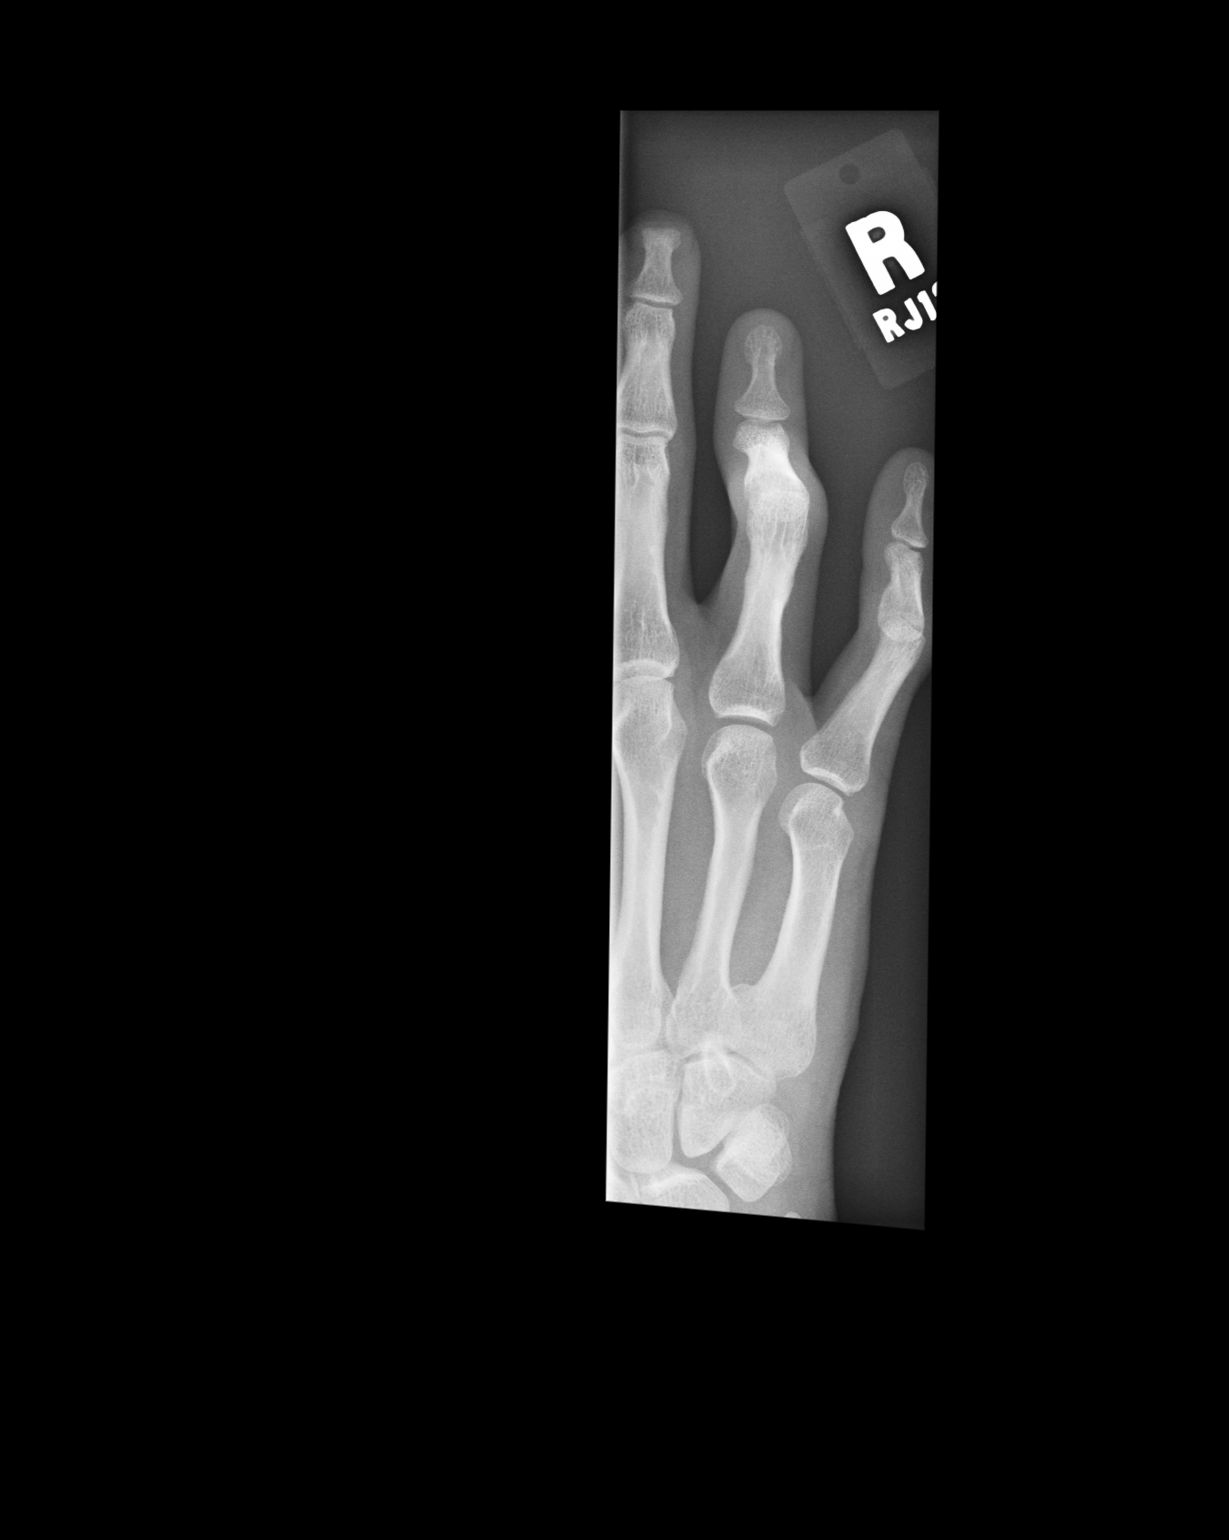
[im 2/3]
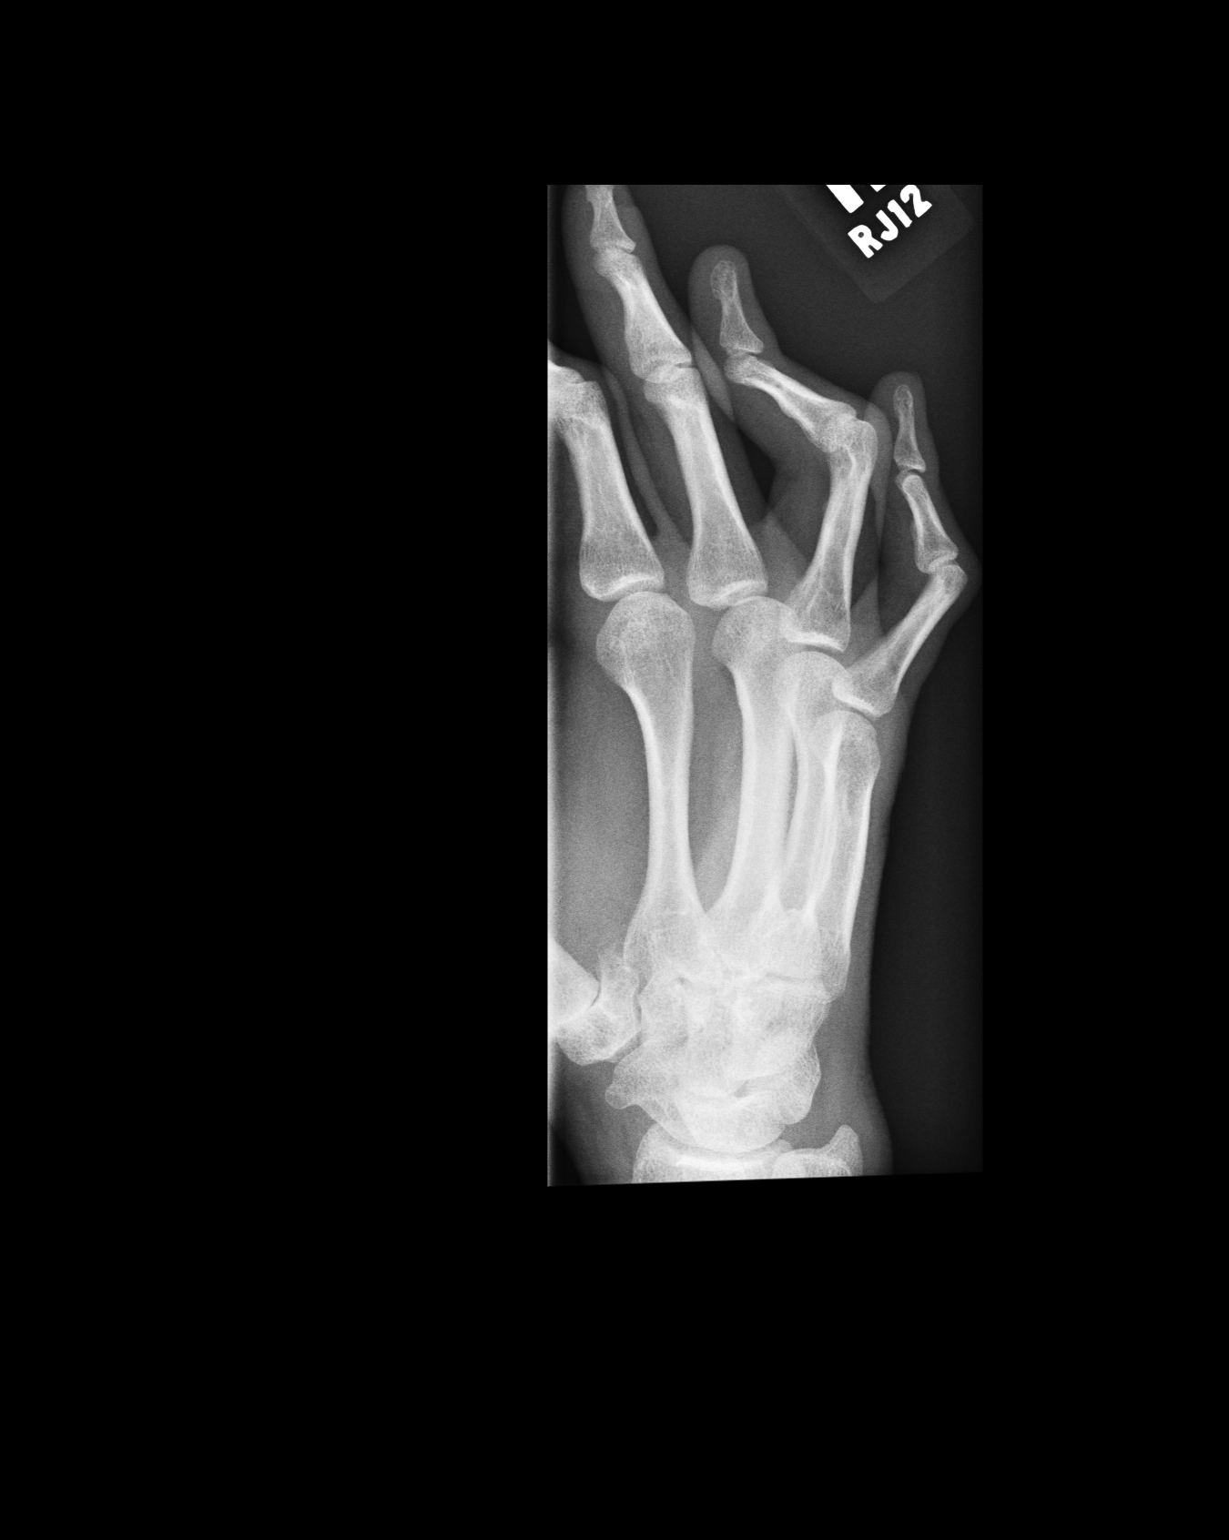
[im 3/3]
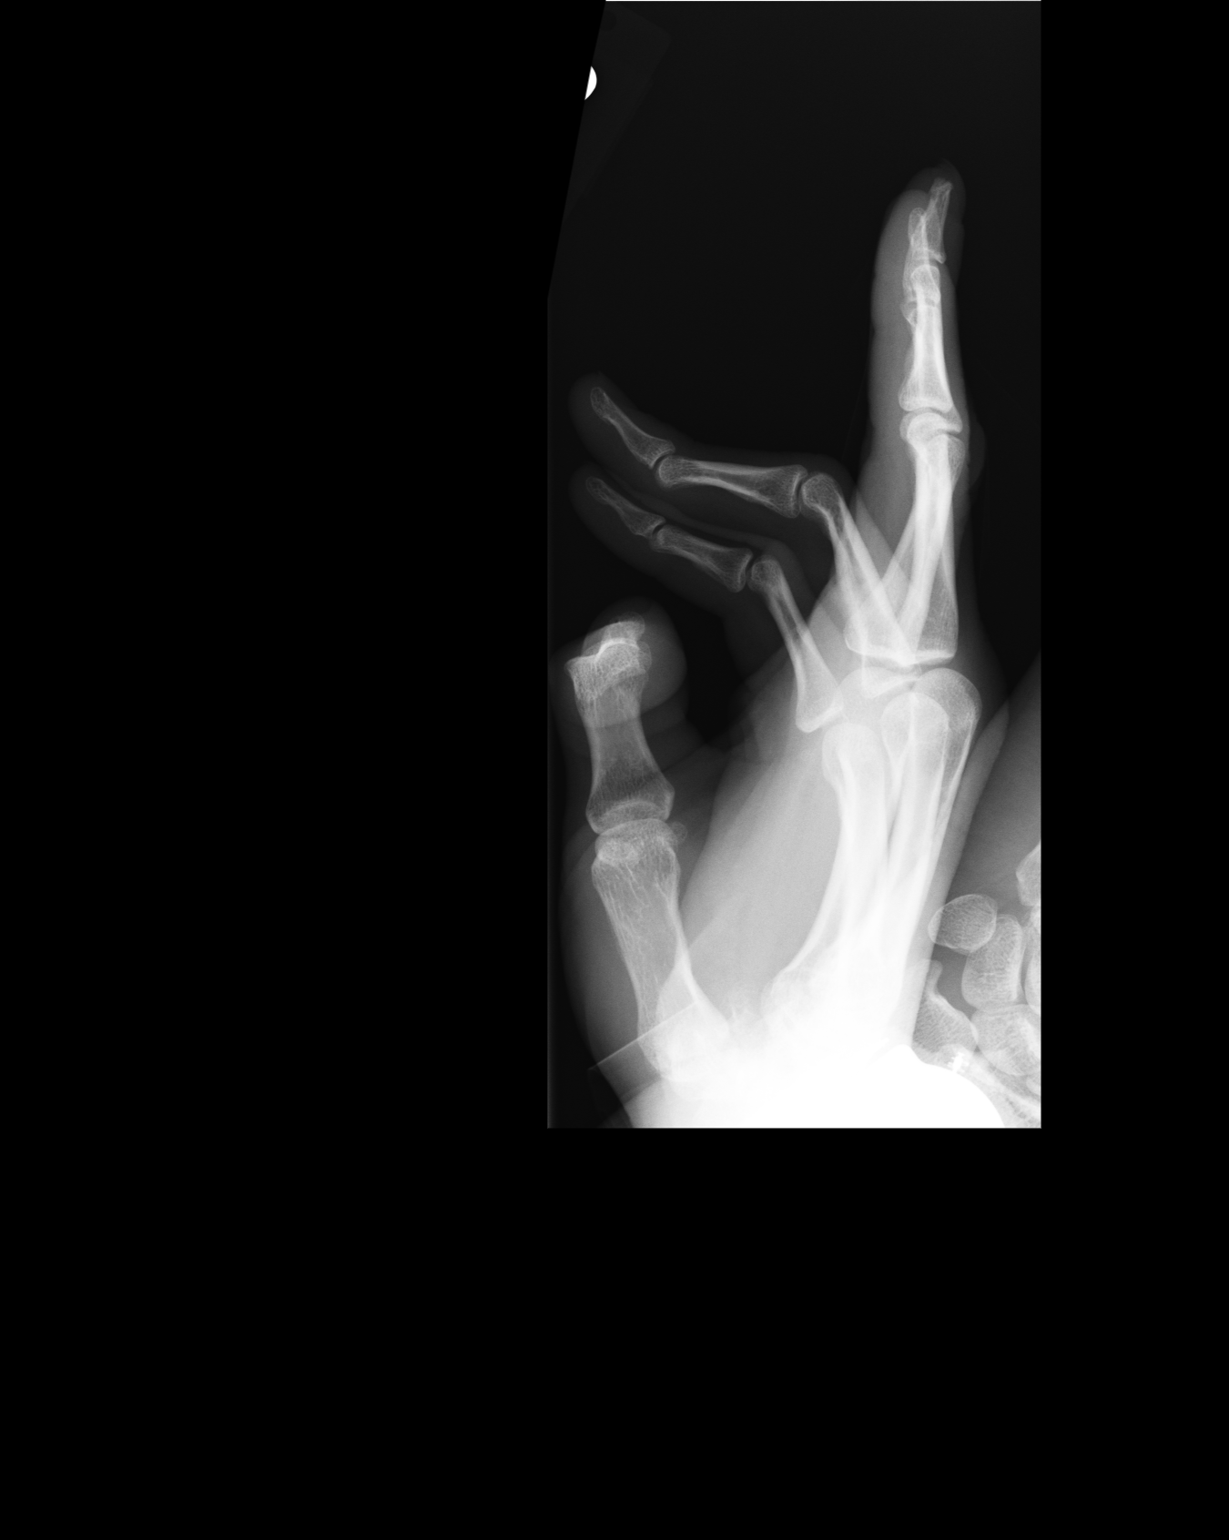

[3 of 3 positions shown; findings below may reference images not displayed]

IMPRESSION: 1.     No acute bony abnormalities are identified.

## 2009-06-21 IMAGING — CR RIGHT HAND - COMPLETE 3+ VIEW
1 series · 3 of 3 positions shown · non-contrast
Comparison: none

REASON FOR EXAM: injury       rm-9
COMMENTS:

[Series 1: view not recorded · 0.17mm/px · 3 of 3 slices shown]
[im 1/3]
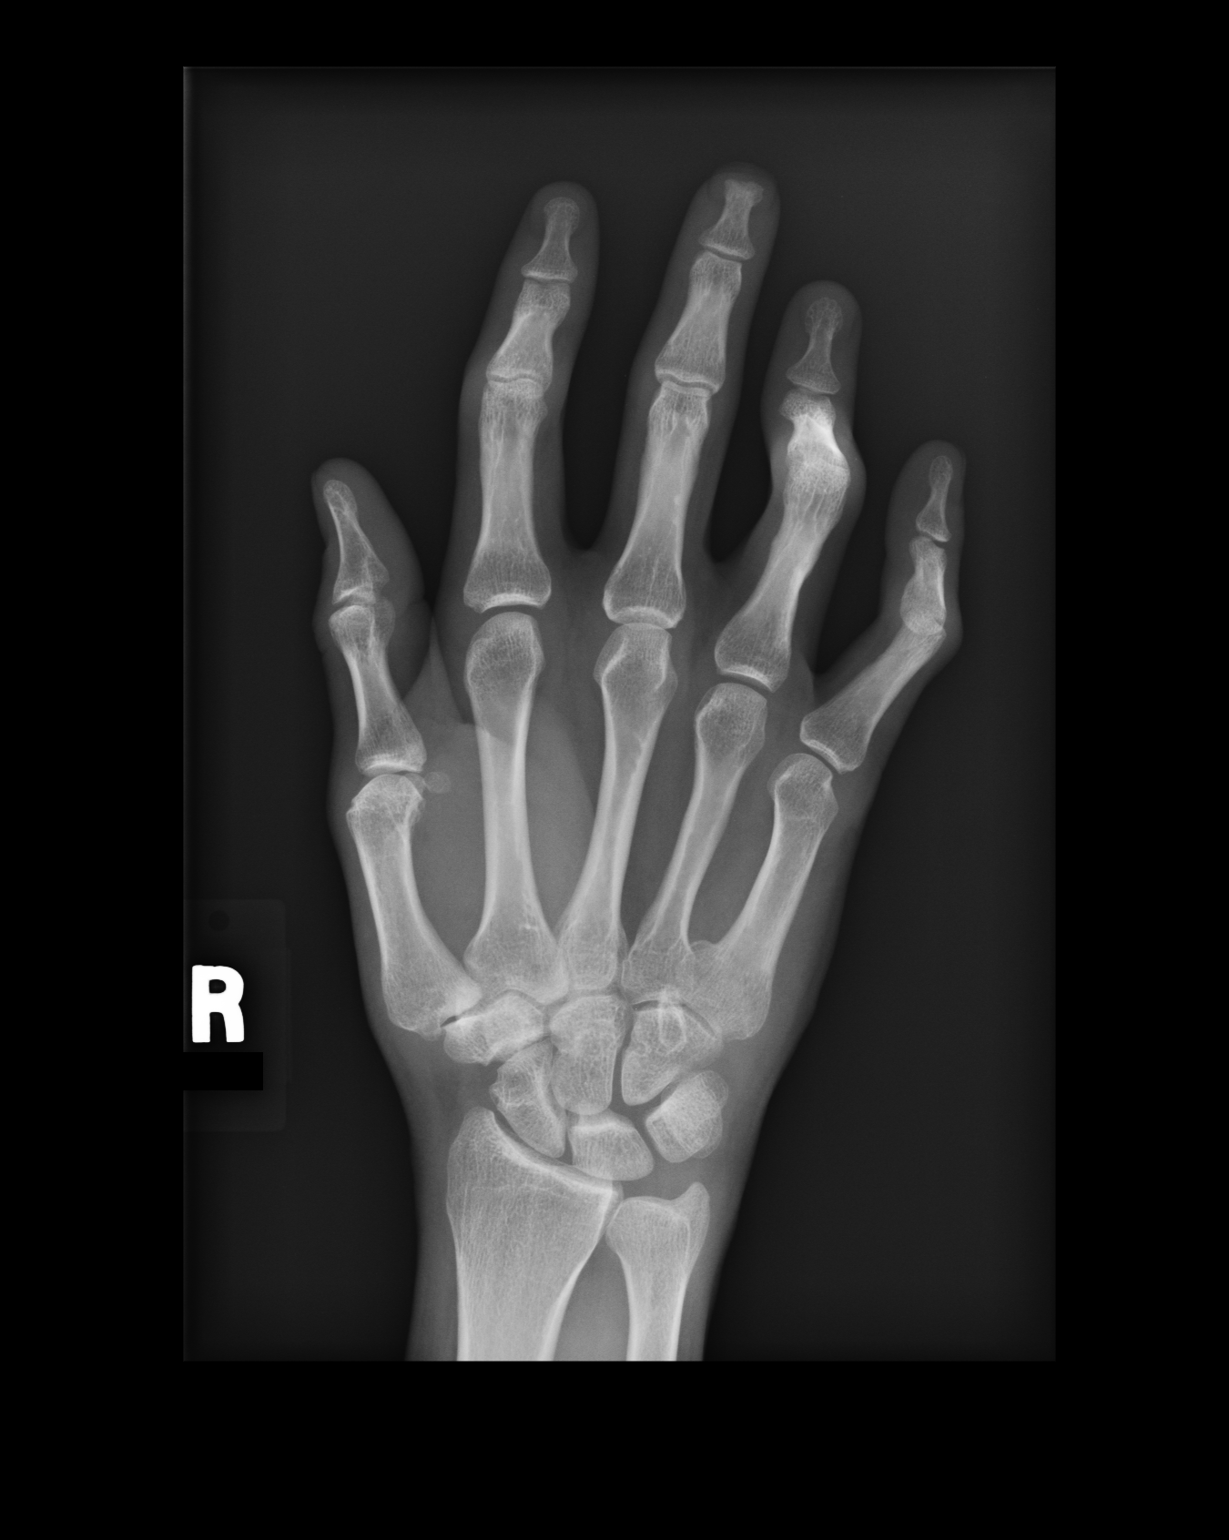
[im 2/3]
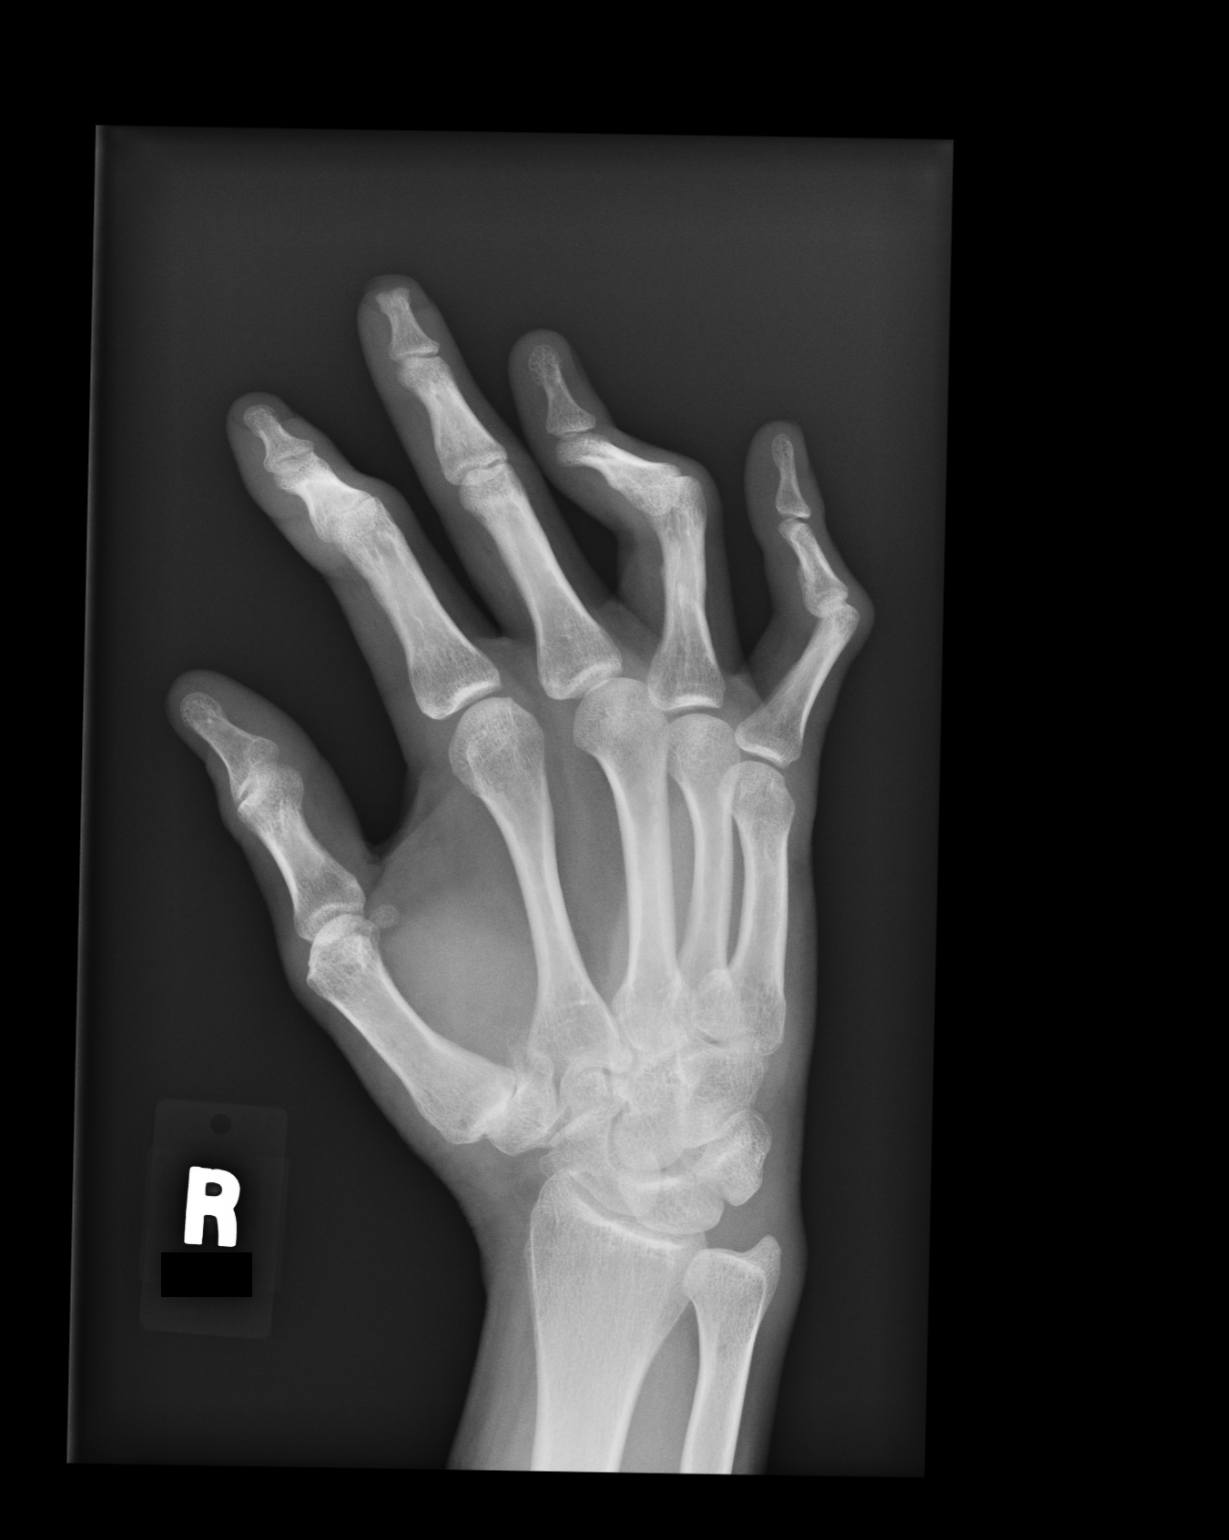
[im 3/3]
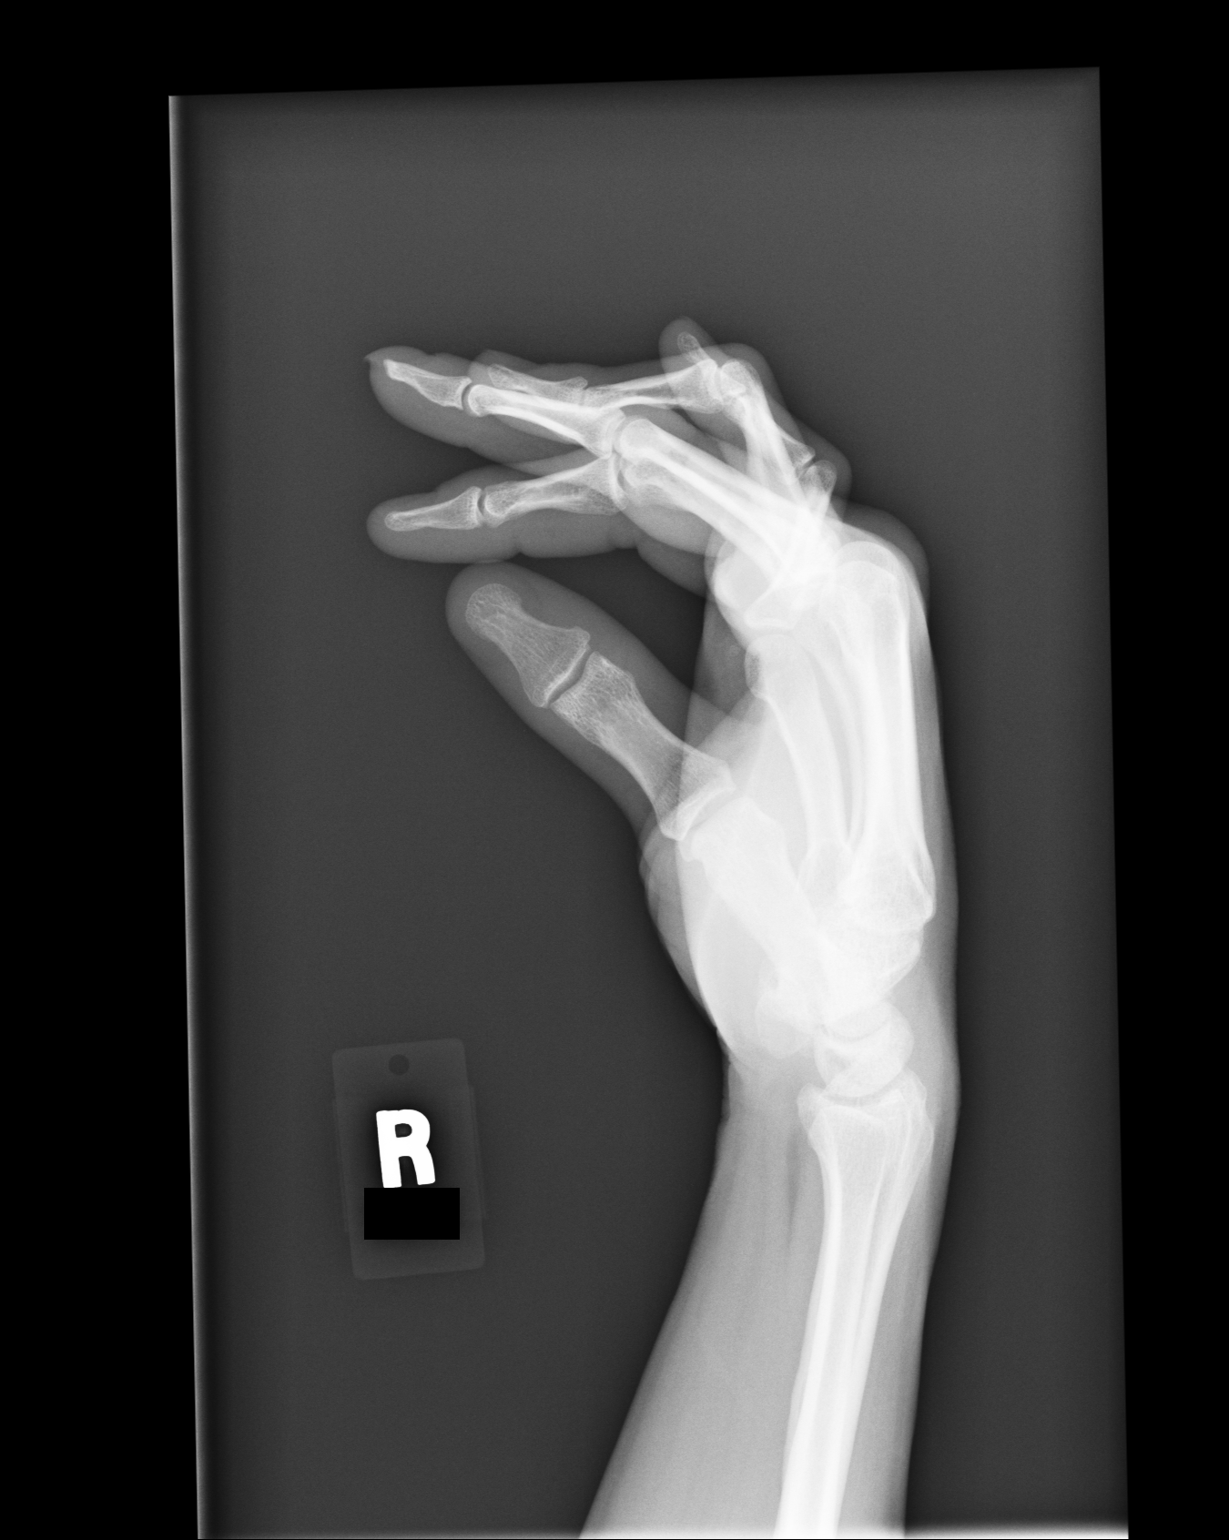

[3 of 3 positions shown; findings below may reference images not displayed]

PROCEDURE:     DXR - DXR HAND RT COMPLETE W/OBLIQUES  - May 17, 2007  [DATE]

RESULT:     Three views of the RIGHT hand show no fracture, dislocation or
other acute bony abnormality. There is observed flexion at the PIP joints of
the fourth and fifth fingers but no associated bony abnormality is
identified.
IMPRESSION: 1. No acute bony abnormalities are identified.
2. No radiodense tissue foreign body is seen.
3. There is flexion at the PIP joints of the fourth and fifth fingers.

## 2009-07-06 IMAGING — CT CT MAXILLOFACIAL W/O CM
1 series · 16 of 30 positions shown, 20 images · non-contrast
Comparison: None.

MAXILLOFACIAL CT WITHOUT CONTRAST:

CLINICAL DATA: Fall. Left facial injury.
TECHNIQUE: Axial and coronal plane CT imaging of the maxillofacial structures
was performed including the facial bones, paranasal sinuses, and orbits.  No
intravenous contrast was administered.

[Series 4: facial 2.0 h32s · axial · 0.38mm/px · z∈[+10,+180]mm · 16 of 93 slices shown, 20 images]
[im 4/93  brain]
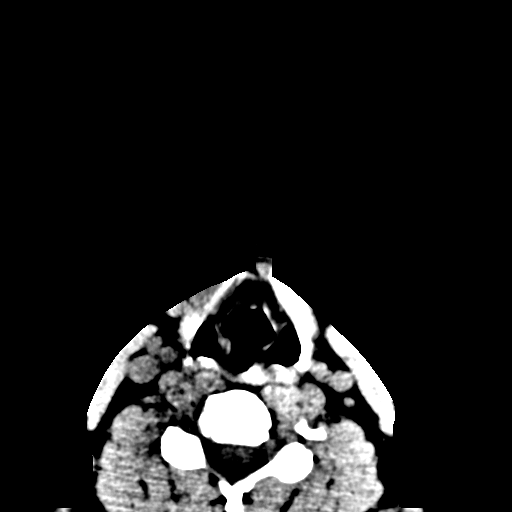
[im 4/93  bone]
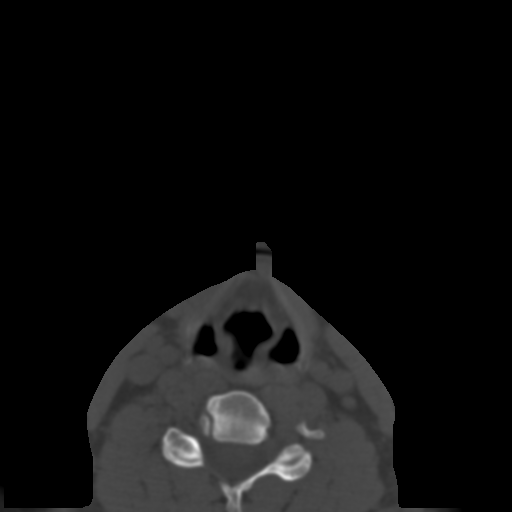
[im 10/93  bone]
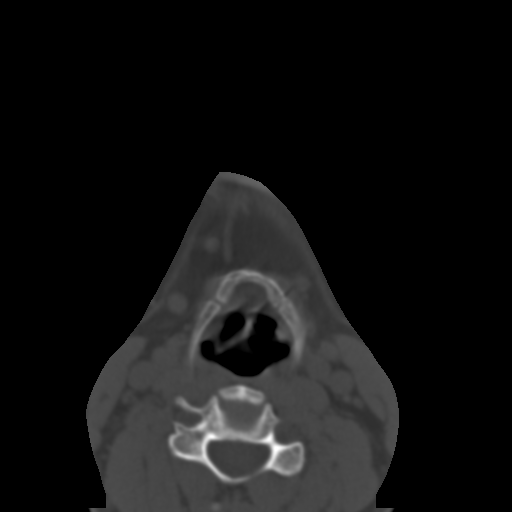
[im 16/93  bone]
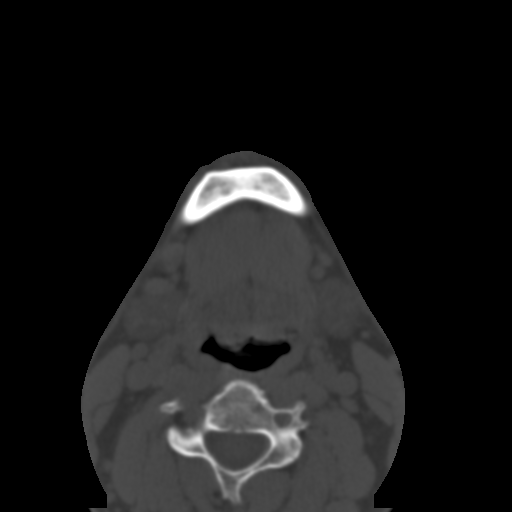
[im 23/93  bone]
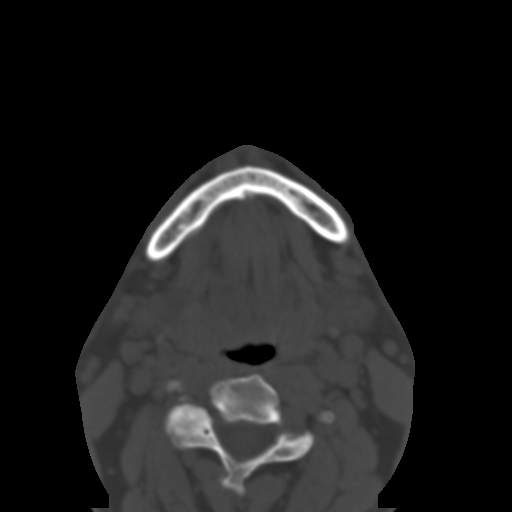
[im 26/93  brain]
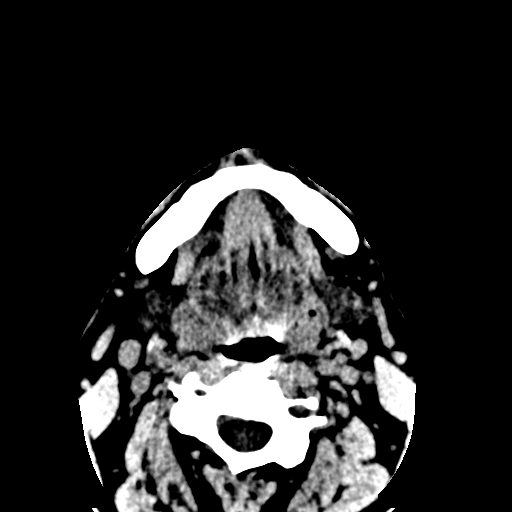
[im 26/93  bone]
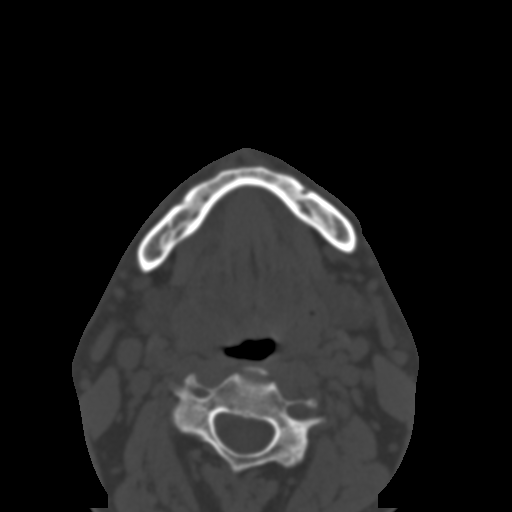
[im 32/93  bone]
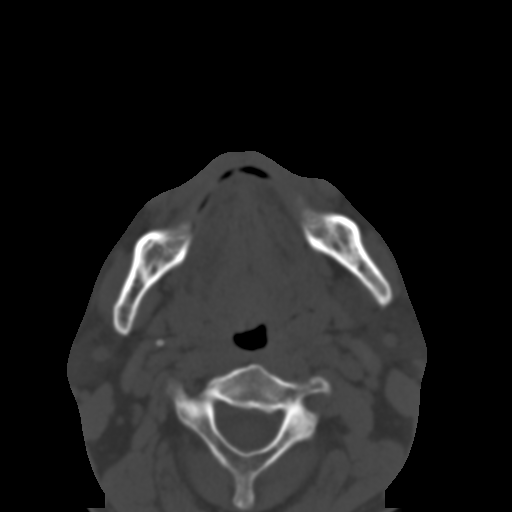
[im 39/93  bone]
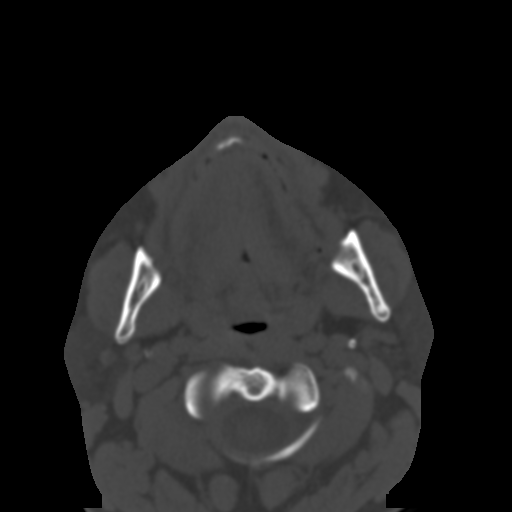
[im 45/93  bone]
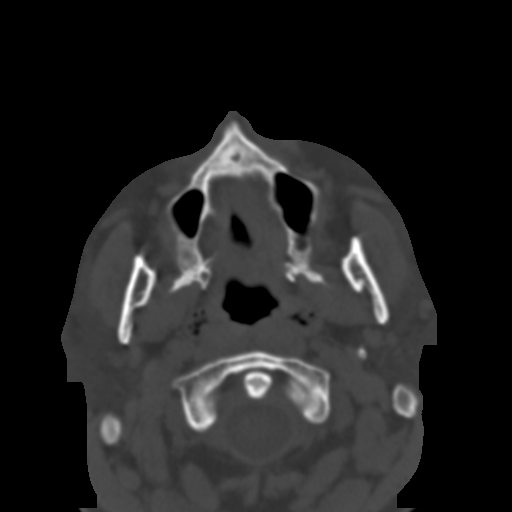
[im 48/93  brain]
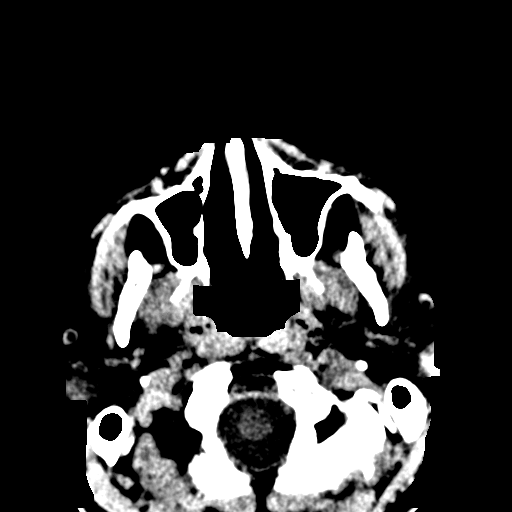
[im 48/93  bone]
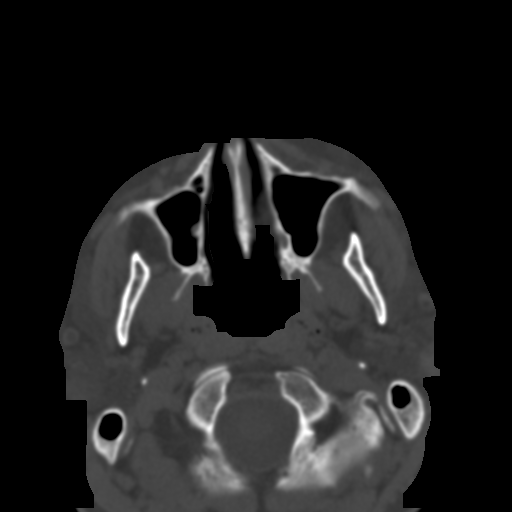
[im 54/93  bone]
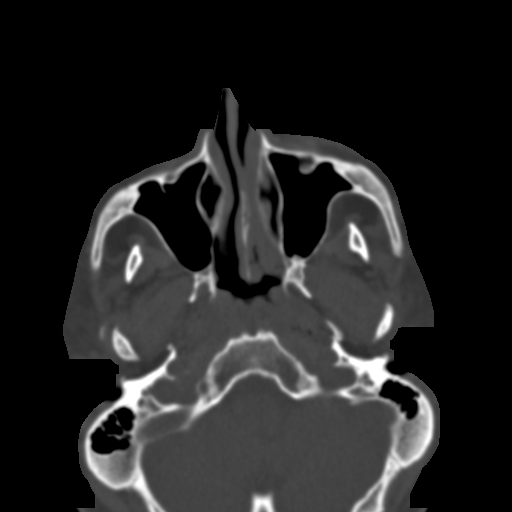
[im 61/93  bone]
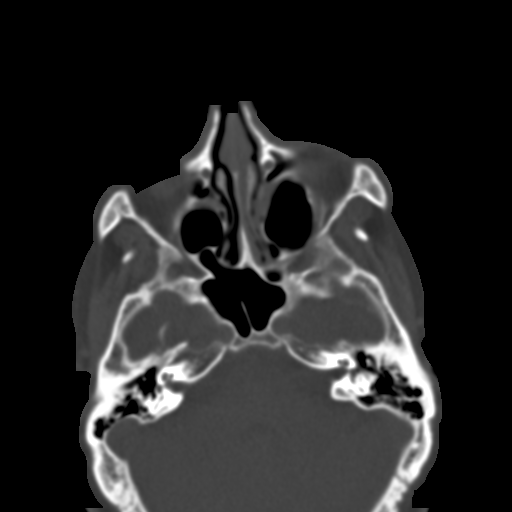
[im 67/93  bone]
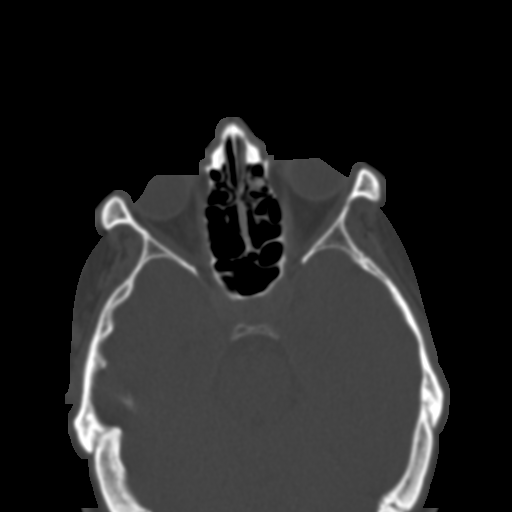
[im 70/93  brain]
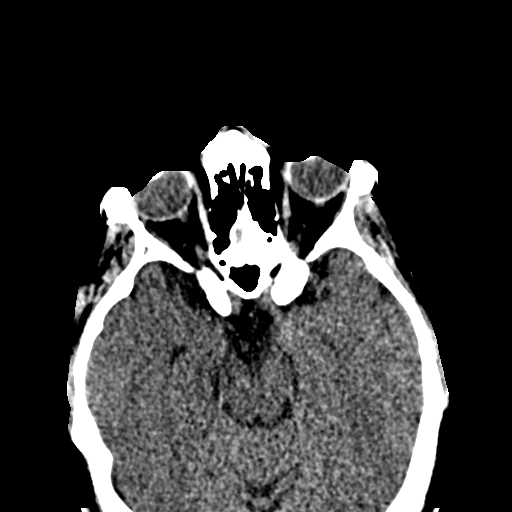
[im 70/93  bone]
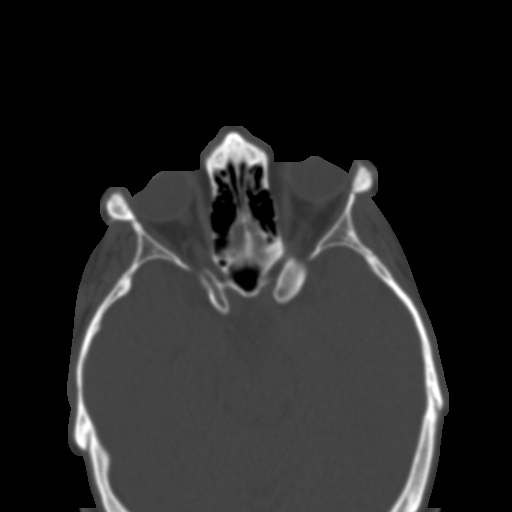
[im 77/93  bone]
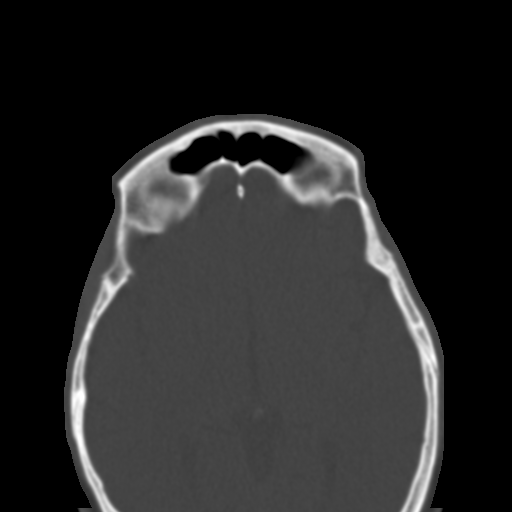
[im 83/93  bone]
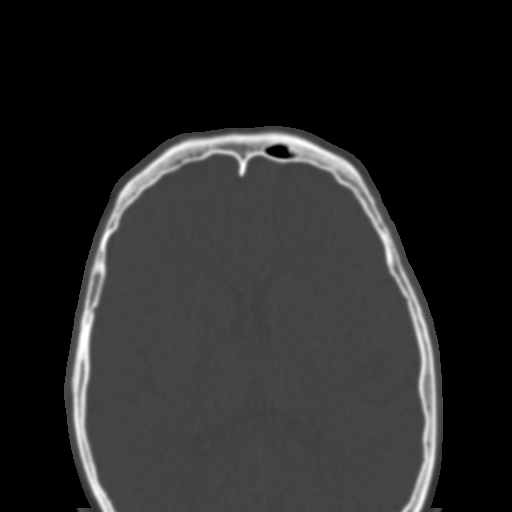
[im 89/93  bone]
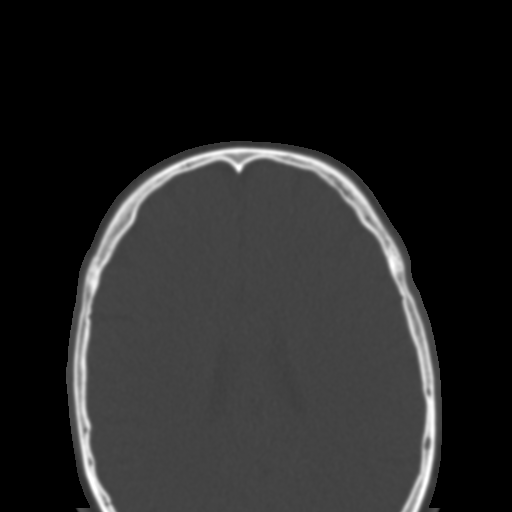

[16 of 30 positions shown; findings below may reference images not displayed]

FINDINGS: There is no facial bone fracture. The paranasal sinuses are clear. No
mastoid air cell effusion. Both mandibular condyles are eroded. The patient is
edentulous.

Soft tissue windows show contusion over the left cheek area. The left globe is
spherical in shape and symmetric in size to the right.
IMPRESSION: No evidence for left facial fracture.

## 2009-07-06 IMAGING — CR PELVIS - 1-2 VIEW
1 series · 1 of 1 positions shown · non-contrast
Comparison: none

REASON FOR EXAM: rm 8    inj
COMMENTS:

PROCEDURE:     DXR - DXR PELVIS AP ONLY  - June 01, 2007  [DATE]
RESULT:     The bony pelvis is adequately mineralized. There is no evidence
of fracture nor dislocation or significant degenerative change. The
overlying soft tissues are normal in appearance.

[view not recorded]
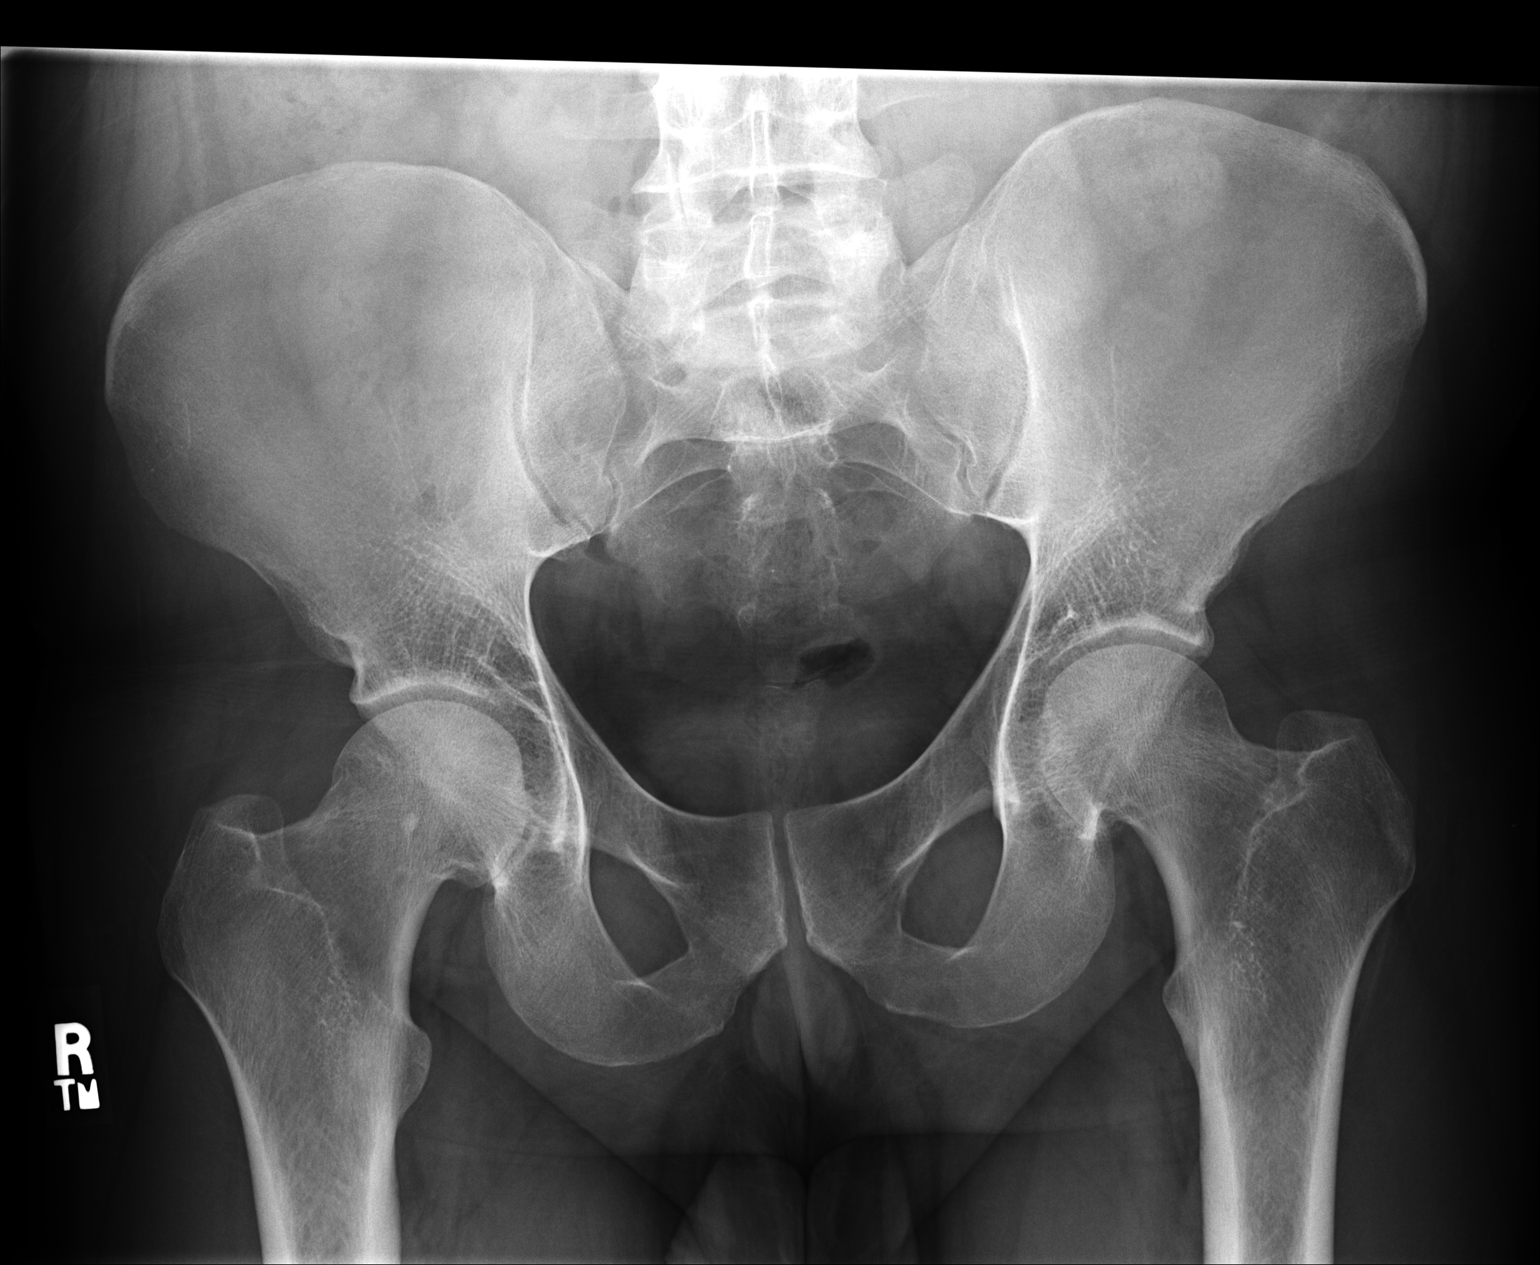

[1 of 1 positions shown; findings below may reference images not displayed]

IMPRESSION: 1.I do not see acute bony abnormality of the pelvis.

## 2009-07-06 IMAGING — CR CERVICAL SPINE - 2-3 VIEW
1 series · 4 of 4 positions shown · non-contrast
Comparison: none

REASON FOR EXAM: rm 8   inj/fell
COMMENTS:

[Series 1: view not recorded · 0.17mm/px · 4 of 4 slices shown]
[im 1/4]
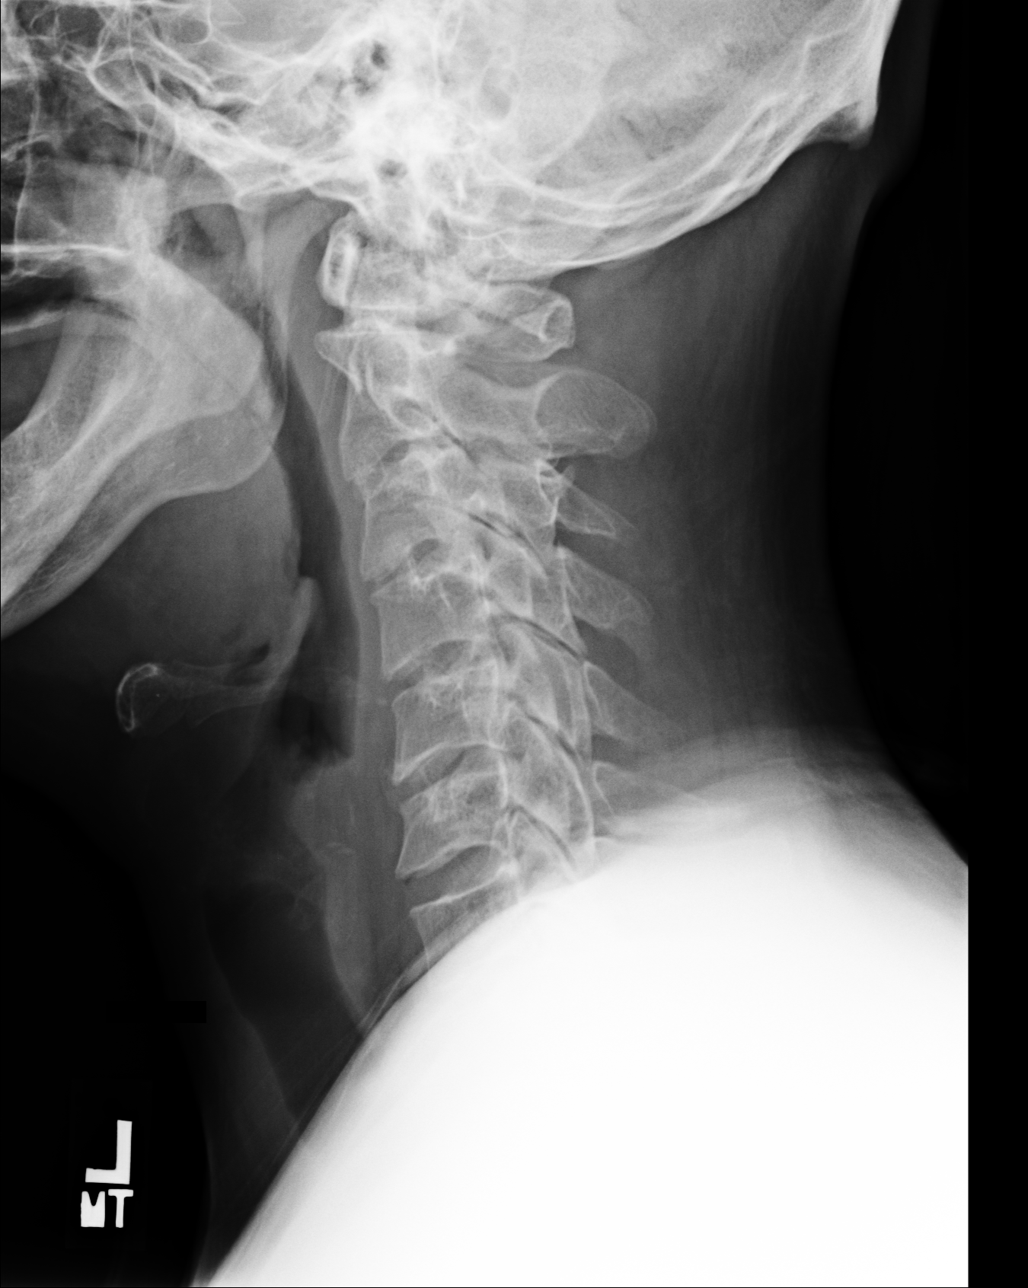
[im 2/4]
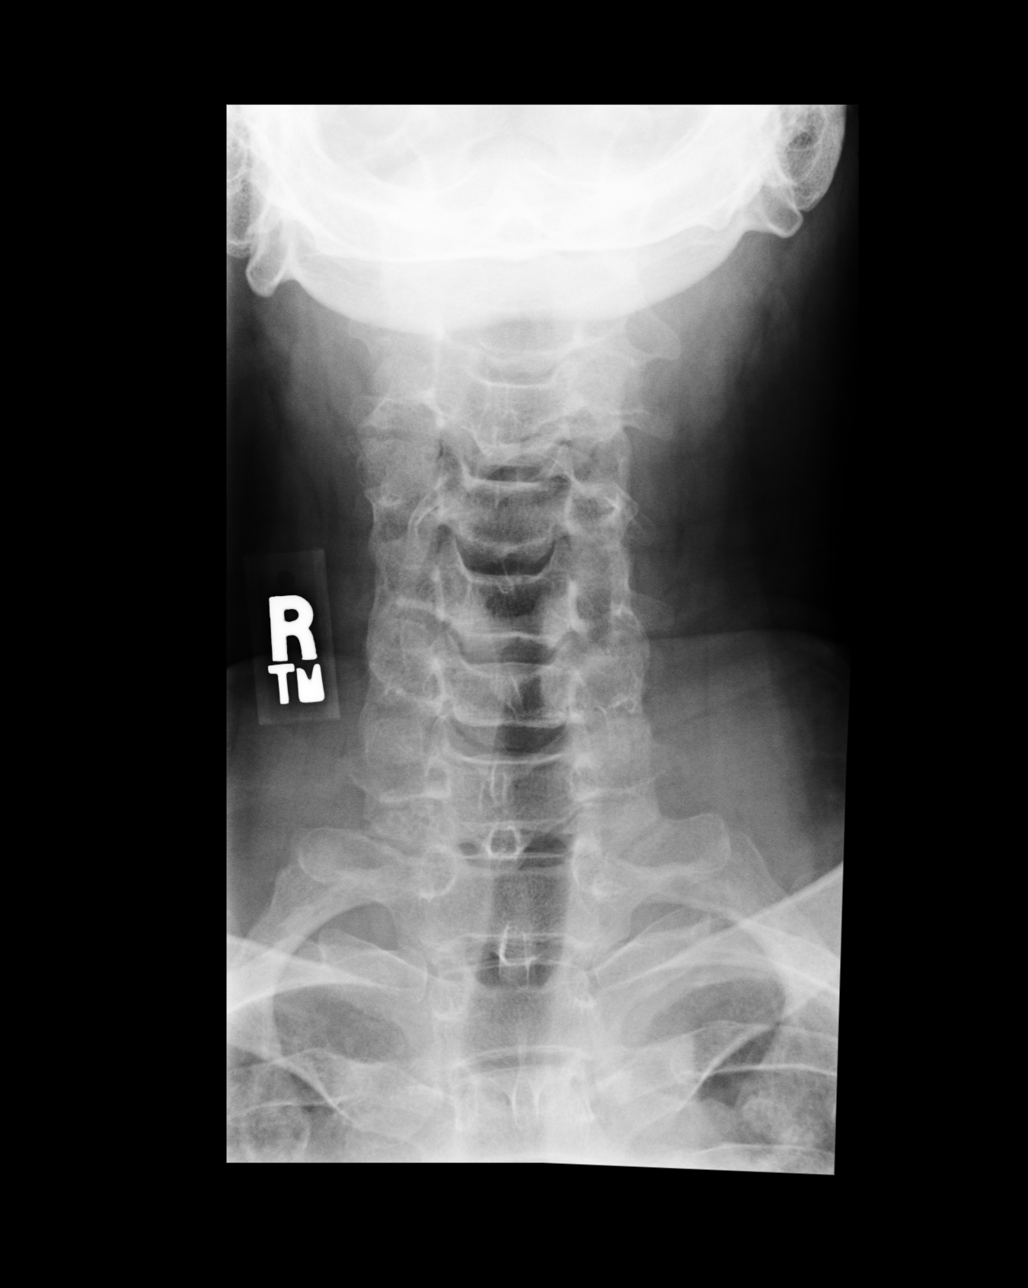
[im 3/4]
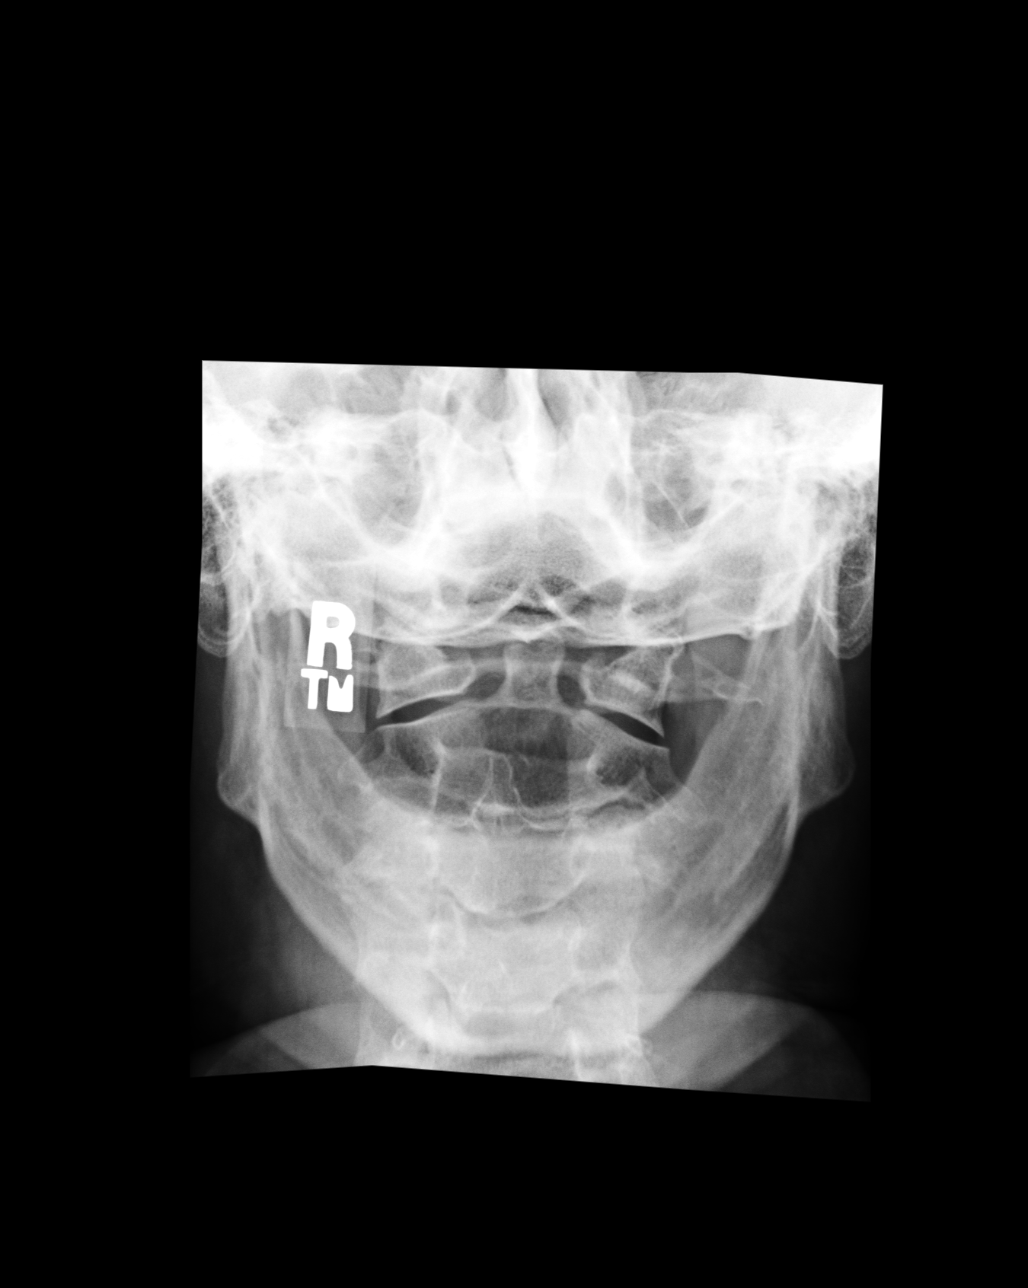
[im 4/4]
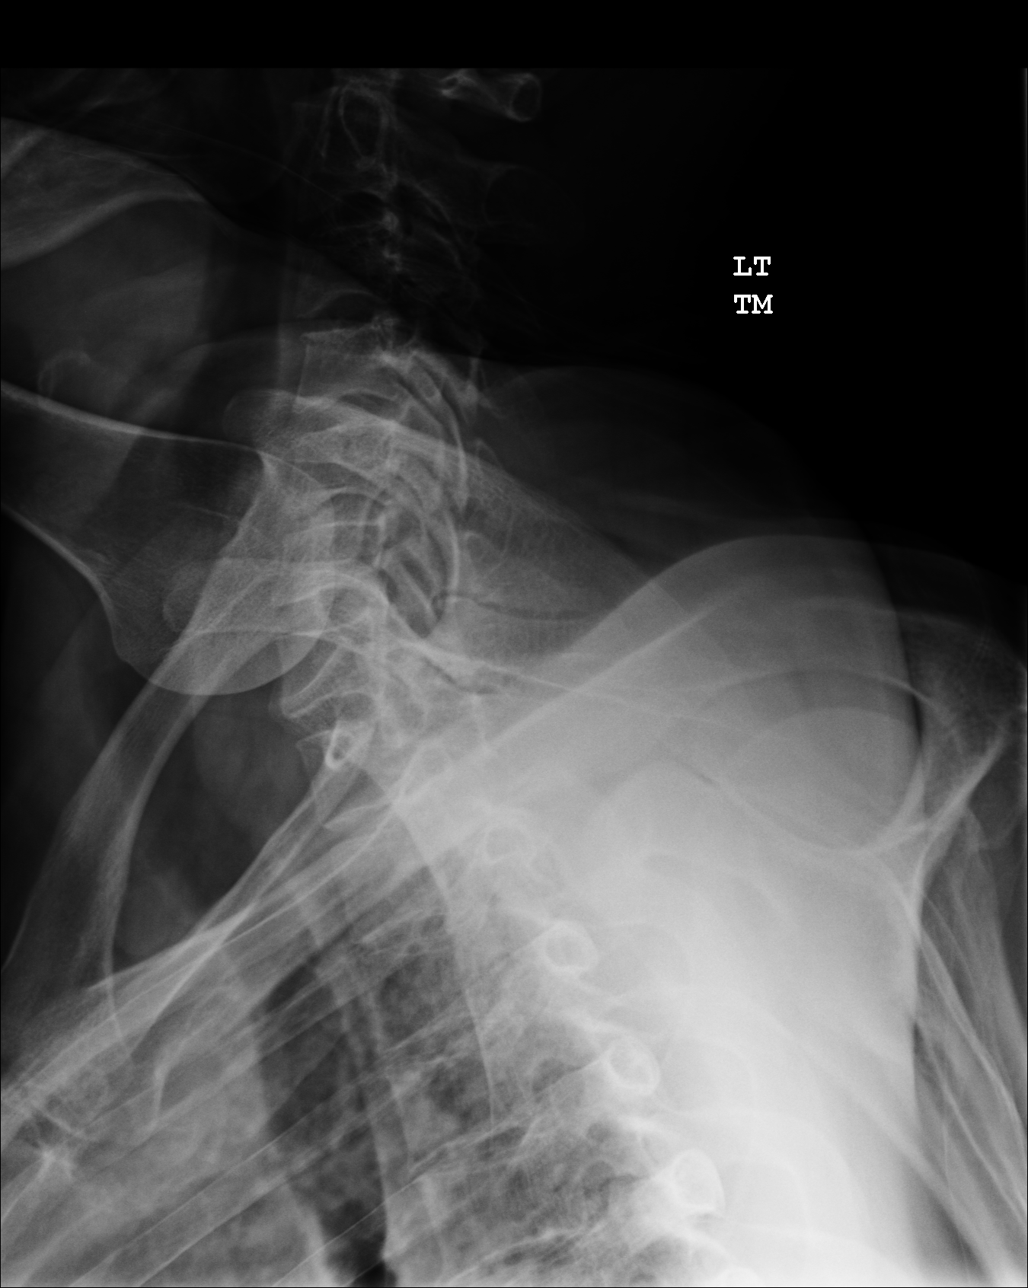

[4 of 4 positions shown; findings below may reference images not displayed]

PROCEDURE:     DXR - DXR C- SPINE AP AND LATERAL  - June 01, 2007  [DATE]

RESULT:     The cervical vertebral bodies are preserved in height. The
prevertebral soft tissue spaces are normal. The intervertebral disc space
heights are minimally narrowed in the upper cervical spine. There is some
loss of the normal cervical lordosis.
IMPRESSION: I do not see evidence of acute fracture nor dislocation of the cervical
spine on these three views. The odontoid appears intact. Followup CT
scanning or MRI is available if the patient's clinical symptoms warrant this.

## 2009-07-06 IMAGING — CR DG HAND COMPLETE 3+V*R*
3 series · 3 of 3 positions shown · non-contrast
Comparison: None.

CLINICAL DATA: Fall. Injury.

[view not recorded (1 of 3)]
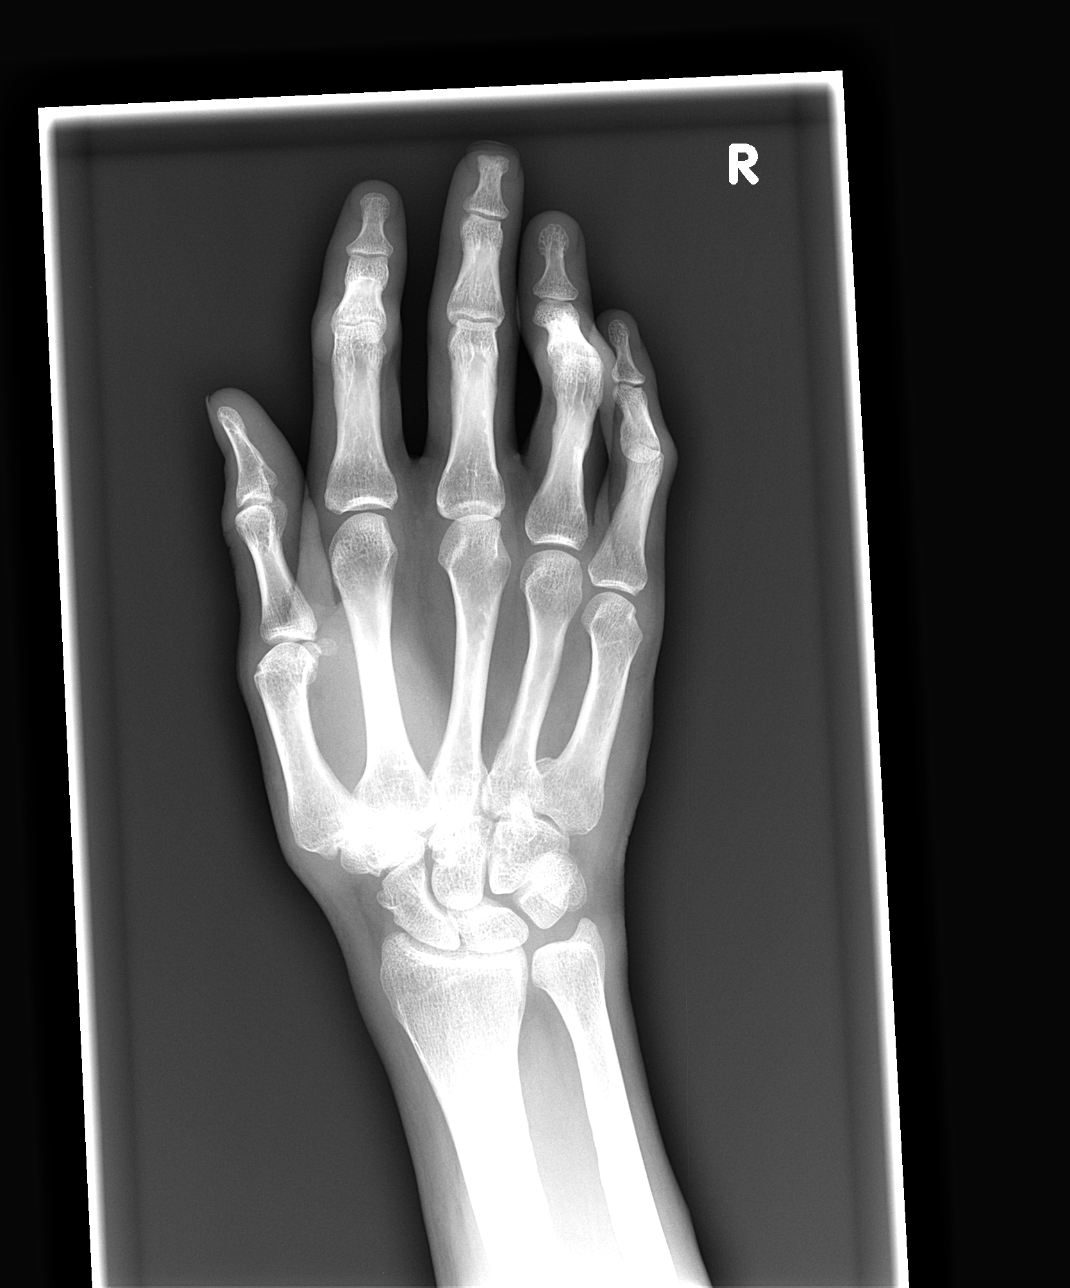

[view not recorded (2 of 3)]
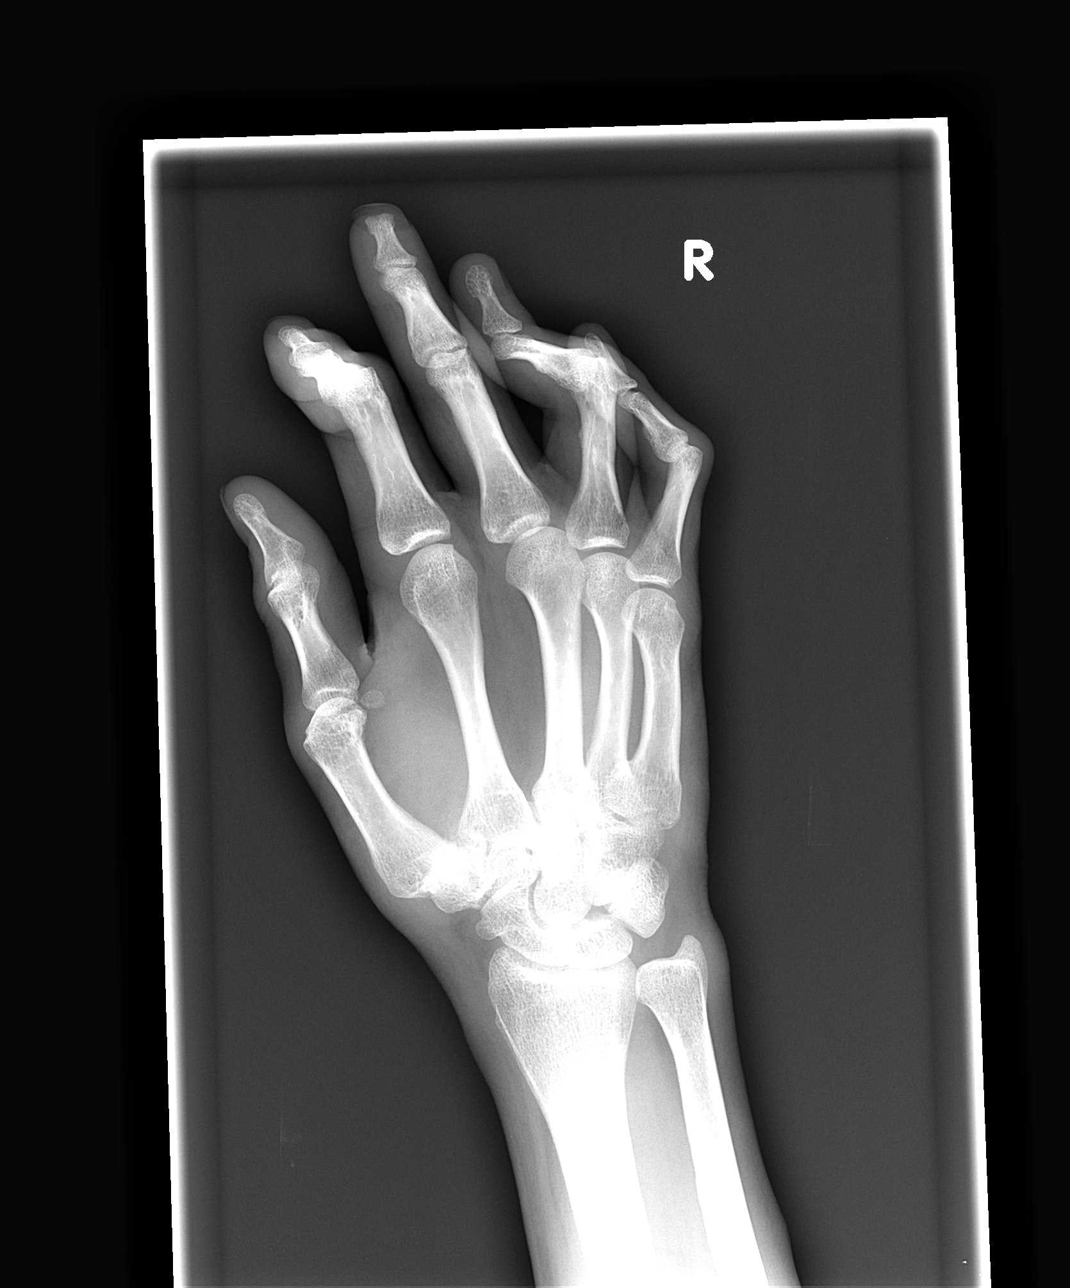

[view not recorded (3 of 3)]
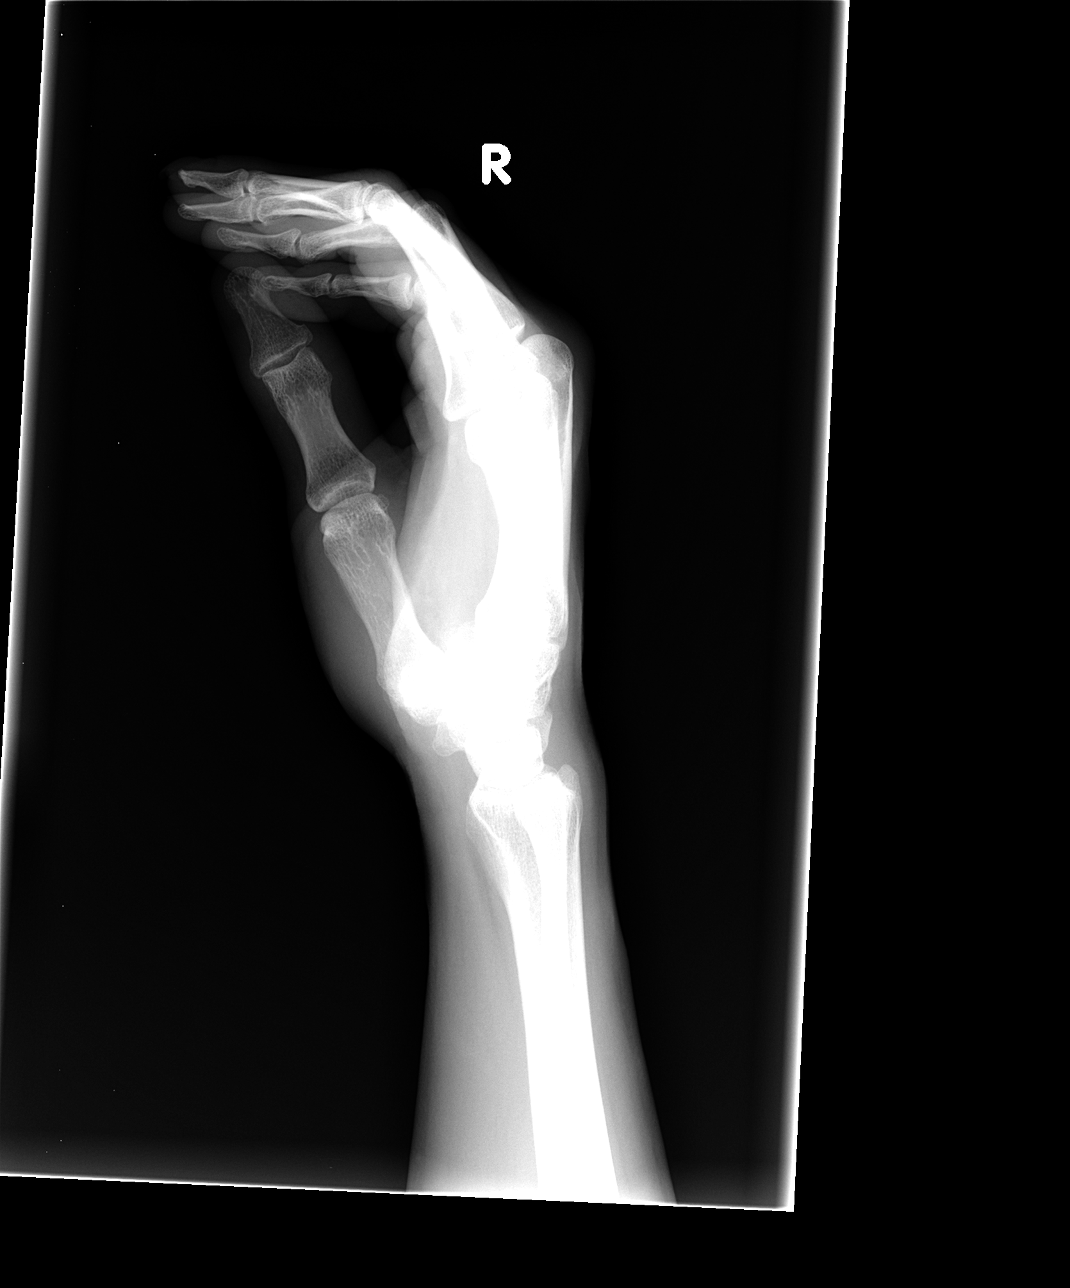

[3 of 3 positions shown; findings below may reference images not displayed]

RIGHT HAND - 3  VIEW:

No evidence for gross fracture-dislocation. Deformity at the base of the fifth
minute carpal may be related to remote trauma. There is some osteoarthritis in
the first carpometacarpal joint.

The ring finger and and the little finger are flexed at the PIP joints. There is
cortical irregularity in the base of the little finger middle phalanx which may
be related to previous trauma, but this area is not well seen on today's exam.
IMPRESSION: Limited study in that the patient cannot extend the little or ring fingers of
the right hand. There is some cortical irregularity at the base of the little
finger middle phalanx which may be related to previous trauma. No definite
fracture or dislocation identified. Consider repeat radiograph when the patient
is able to straighten fingers.

## 2009-07-14 IMAGING — CR DG HAND COMPLETE 3+V*L*
3 series · 3 of 3 positions shown · non-contrast
Comparison: None

CLINICAL DATA: Pain and second digit after fall.

LEFT HAND - 3 VIEW

[view not recorded (1 of 3)]
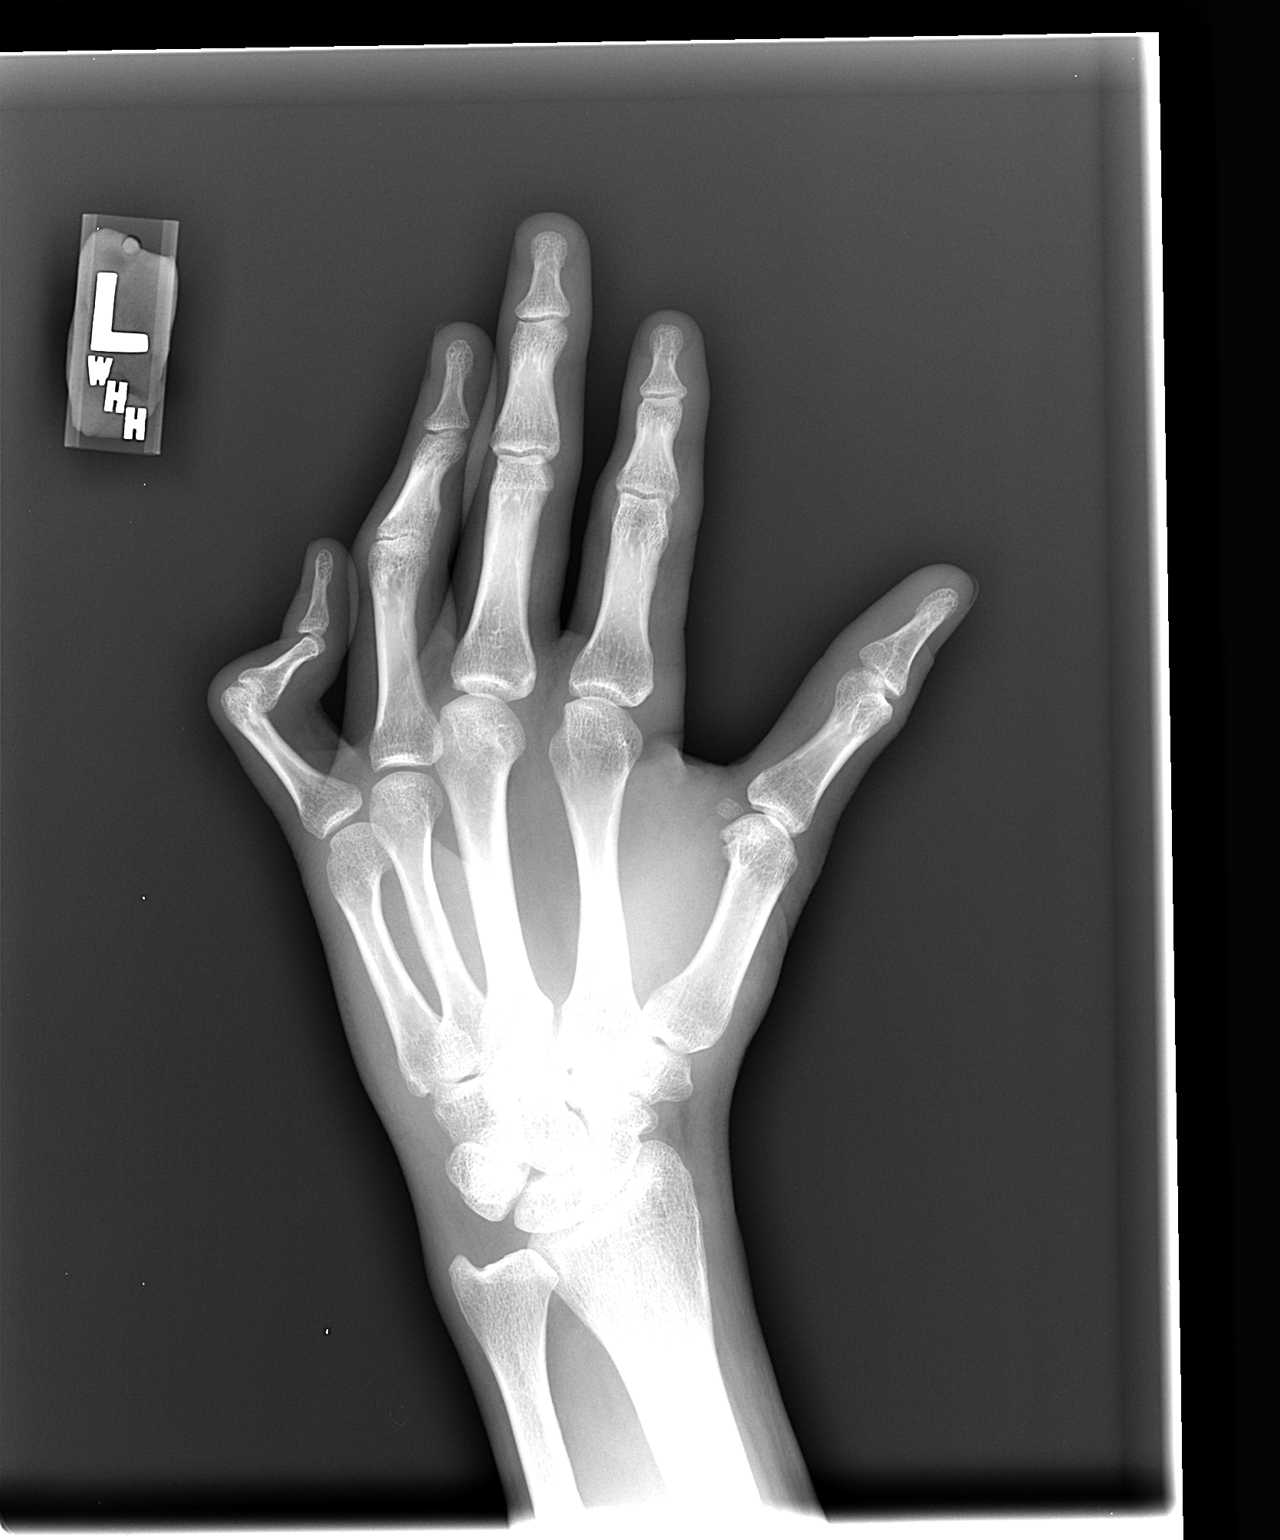

[view not recorded (2 of 3)]
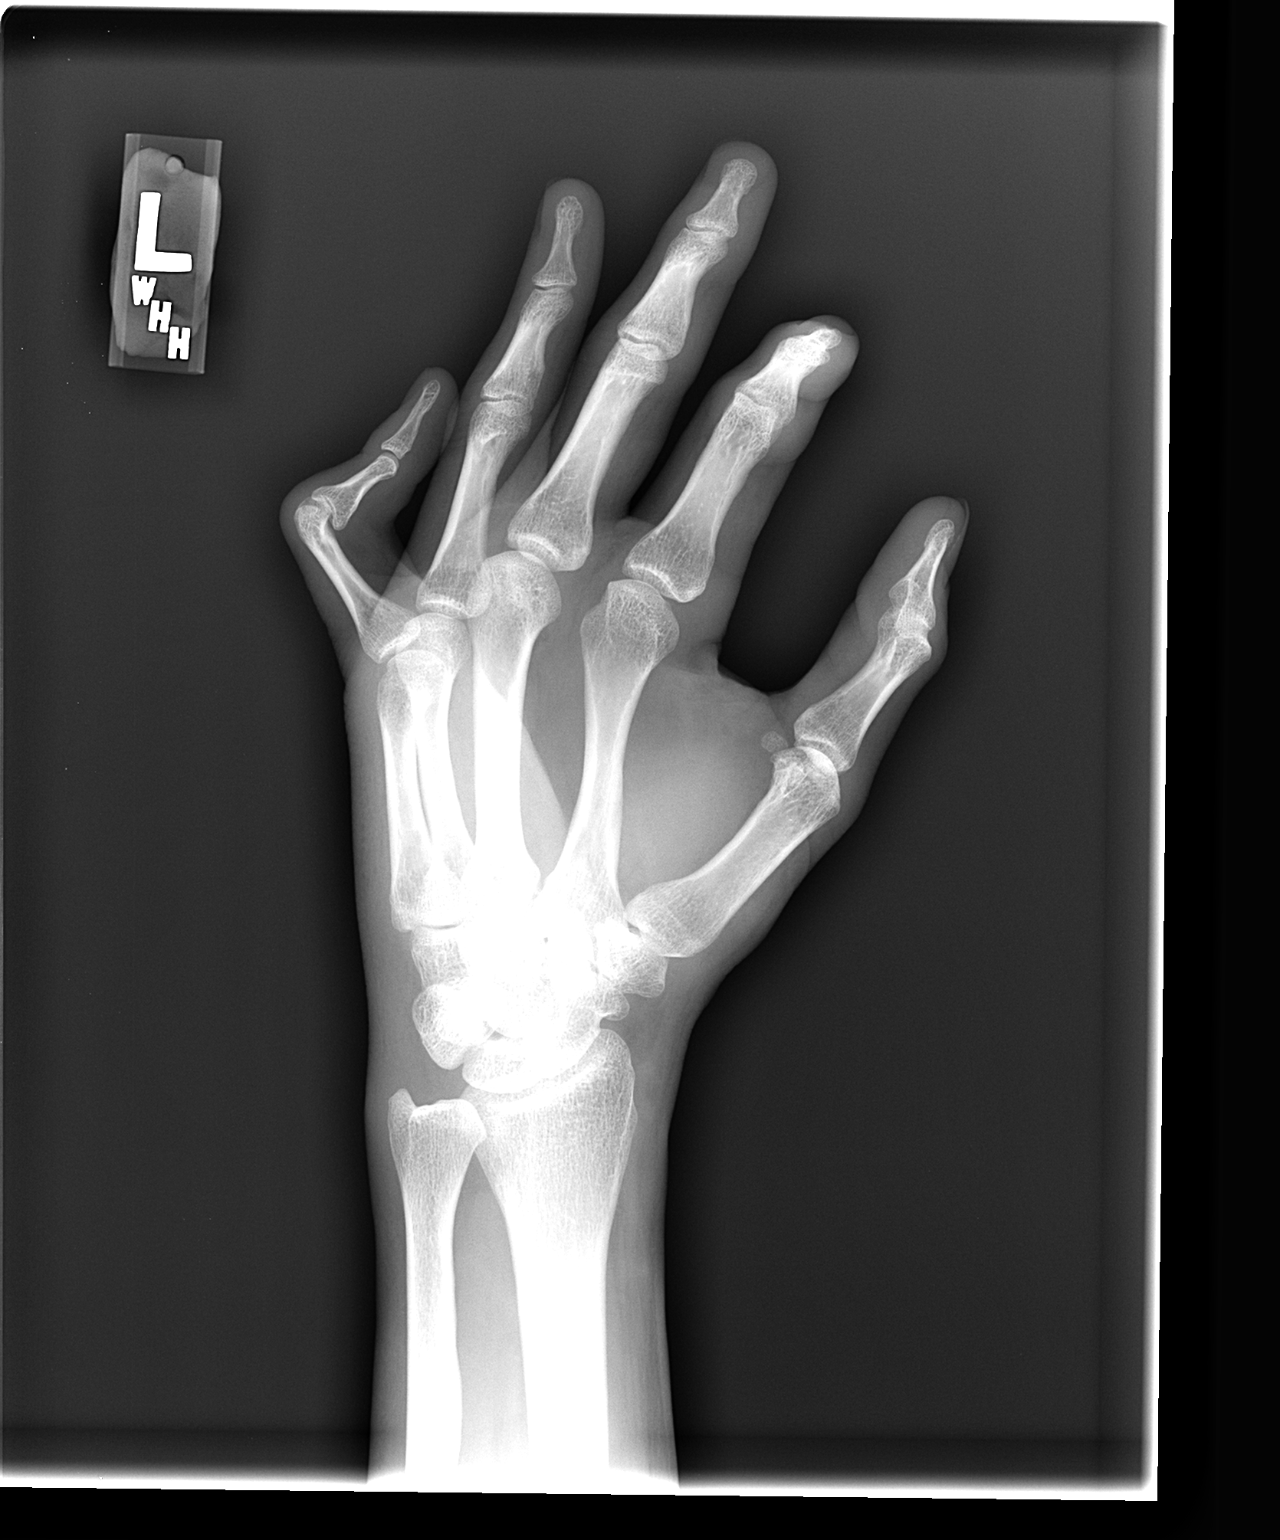

[view not recorded (3 of 3)]
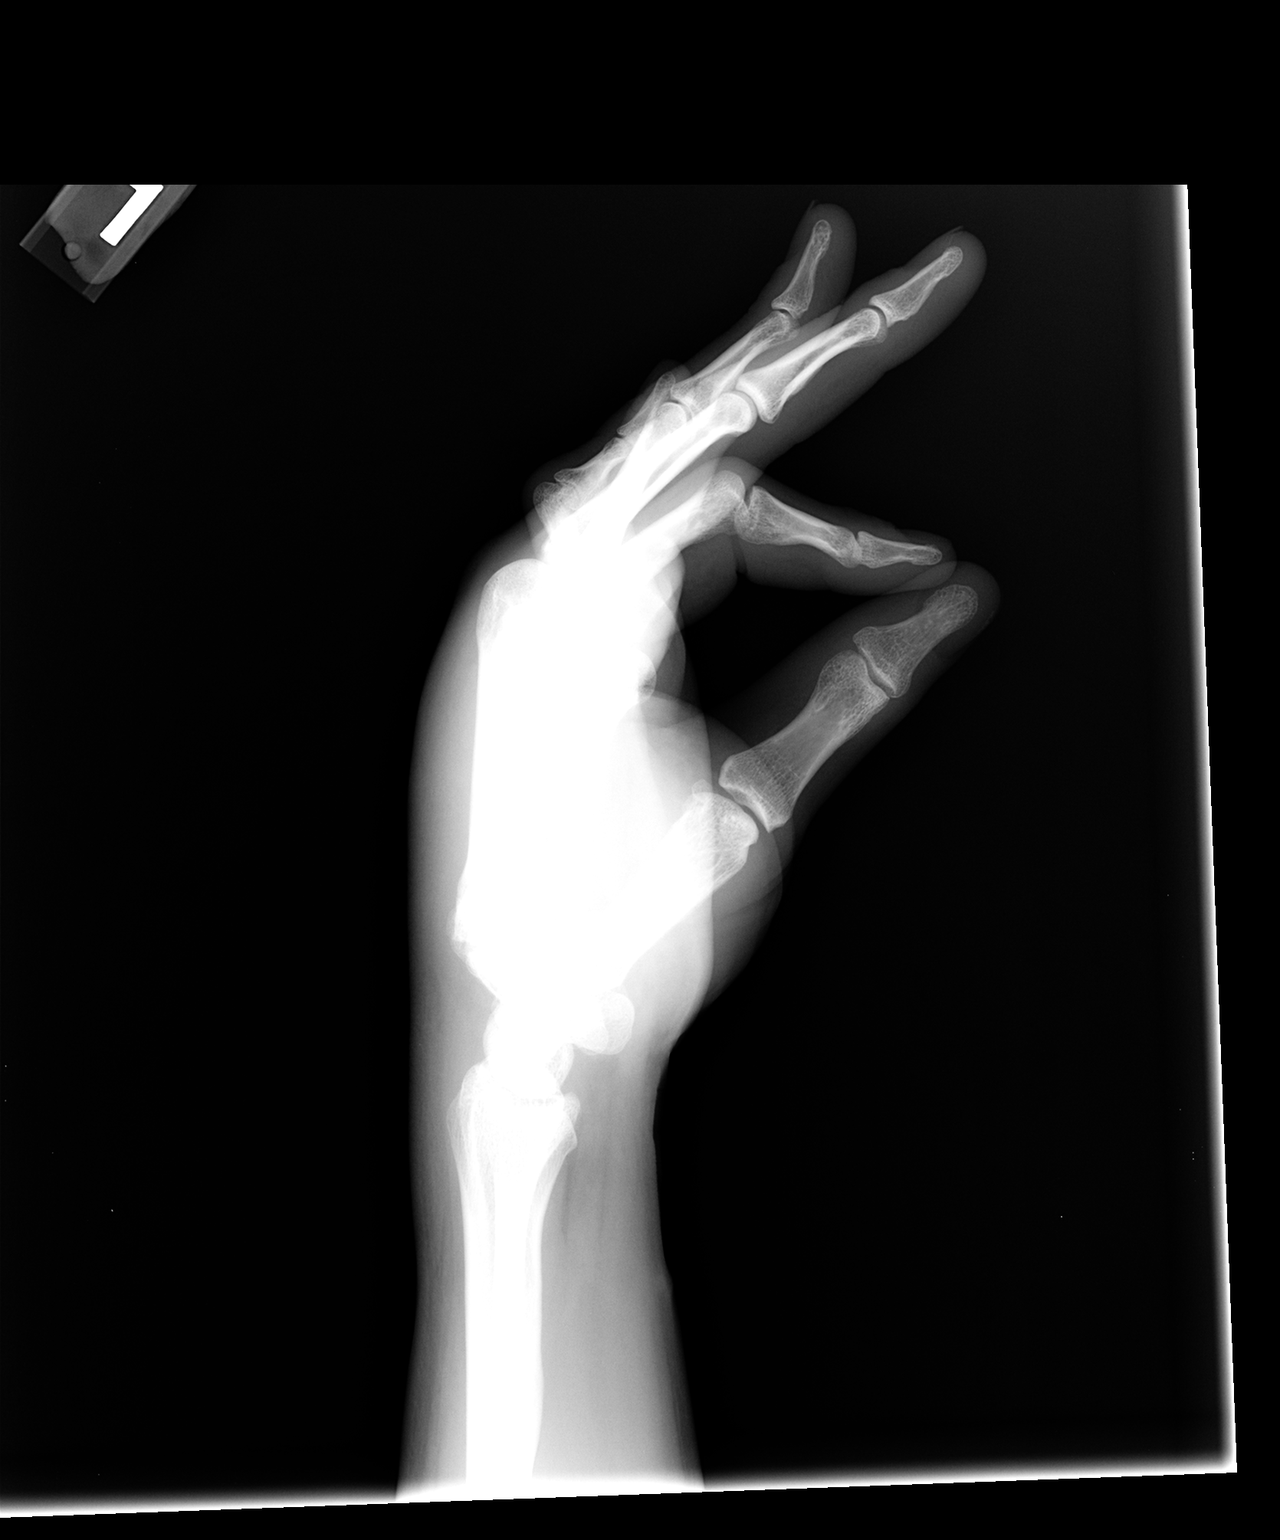

[3 of 3 positions shown; findings below may reference images not displayed]

FINDINGS: Remote trauma to fourth ray with PIP flexion deformity. Second ray
incompletely extended on oblique view. Soft tissue swelling about the dorsal
surface of the metacarpals. No acute fracture or dislocation.

IMPRESSION

1. Dorsal soft tissue swelling without acute osseous abnormality.

## 2009-09-03 IMAGING — CR DG FOOT COMPLETE 3+V*L*
1 series · 3 of 3 positions shown · non-contrast
Comparison: none

REASON FOR EXAM: r/ fb
COMMENTS:

[Series 1: view not recorded · 0.17mm/px · 3 of 3 slices shown]
[im 1/3]
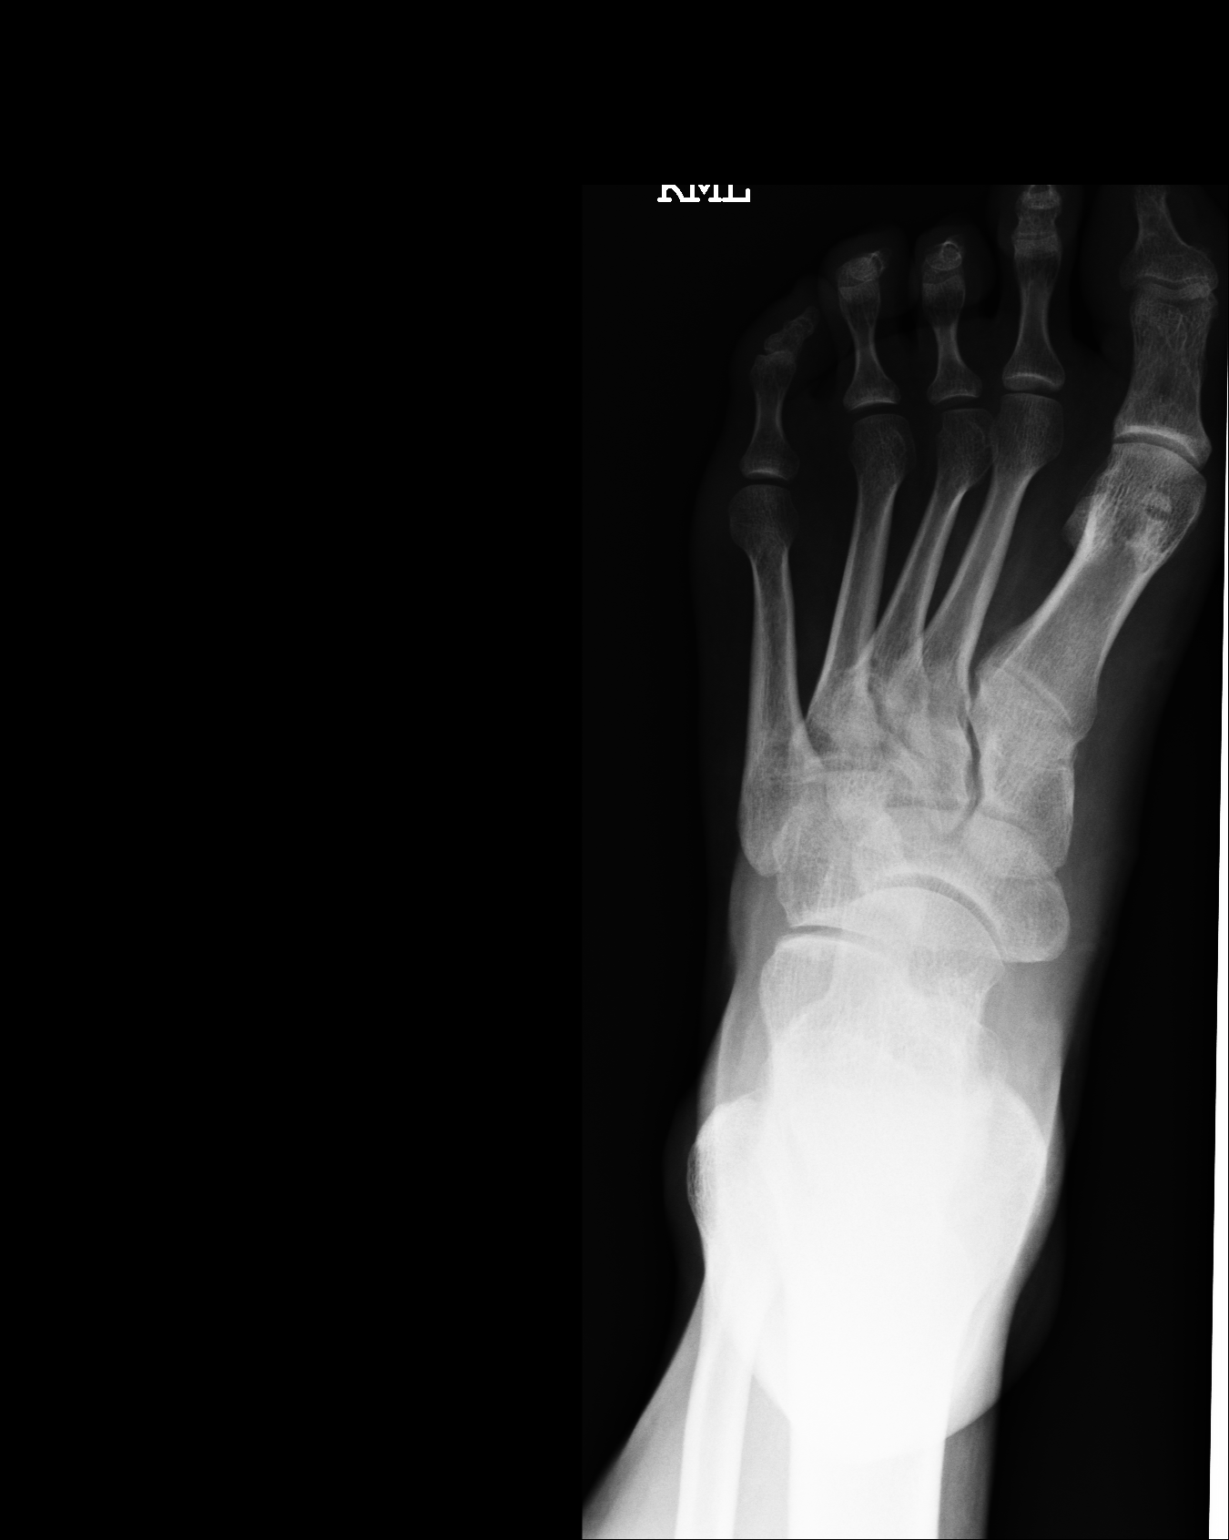
[im 2/3]
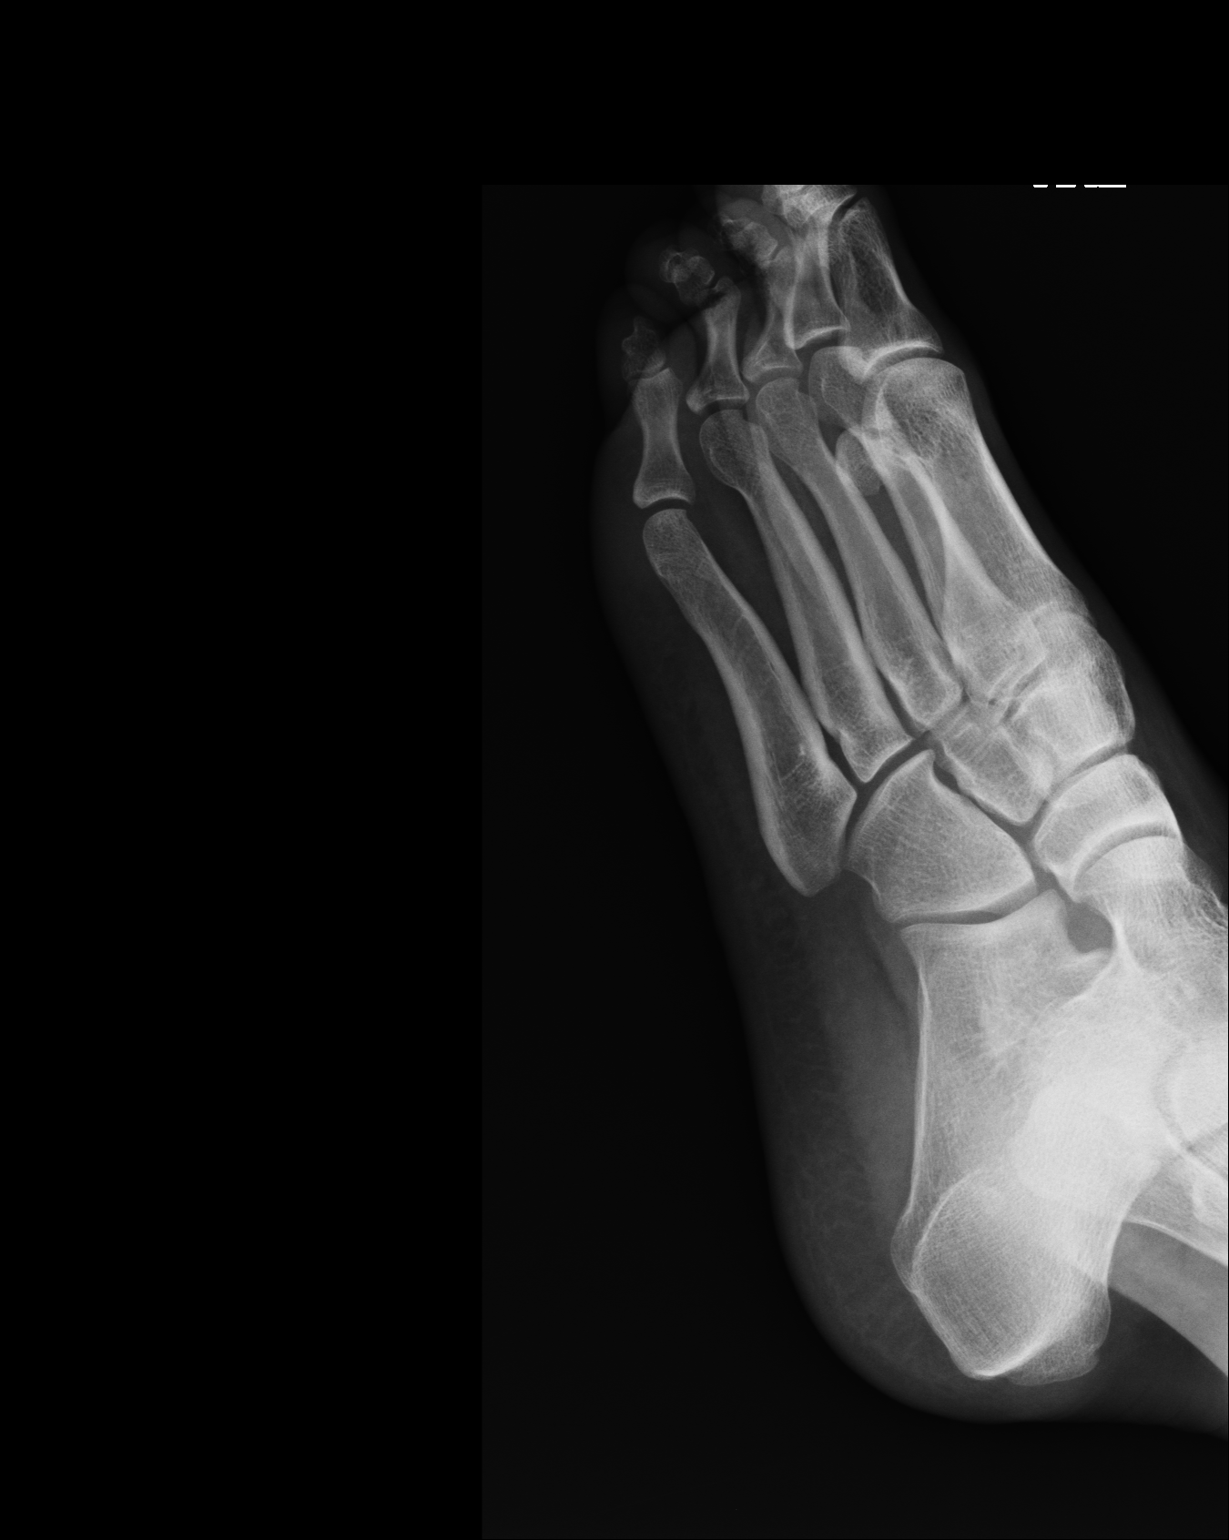
[im 3/3]
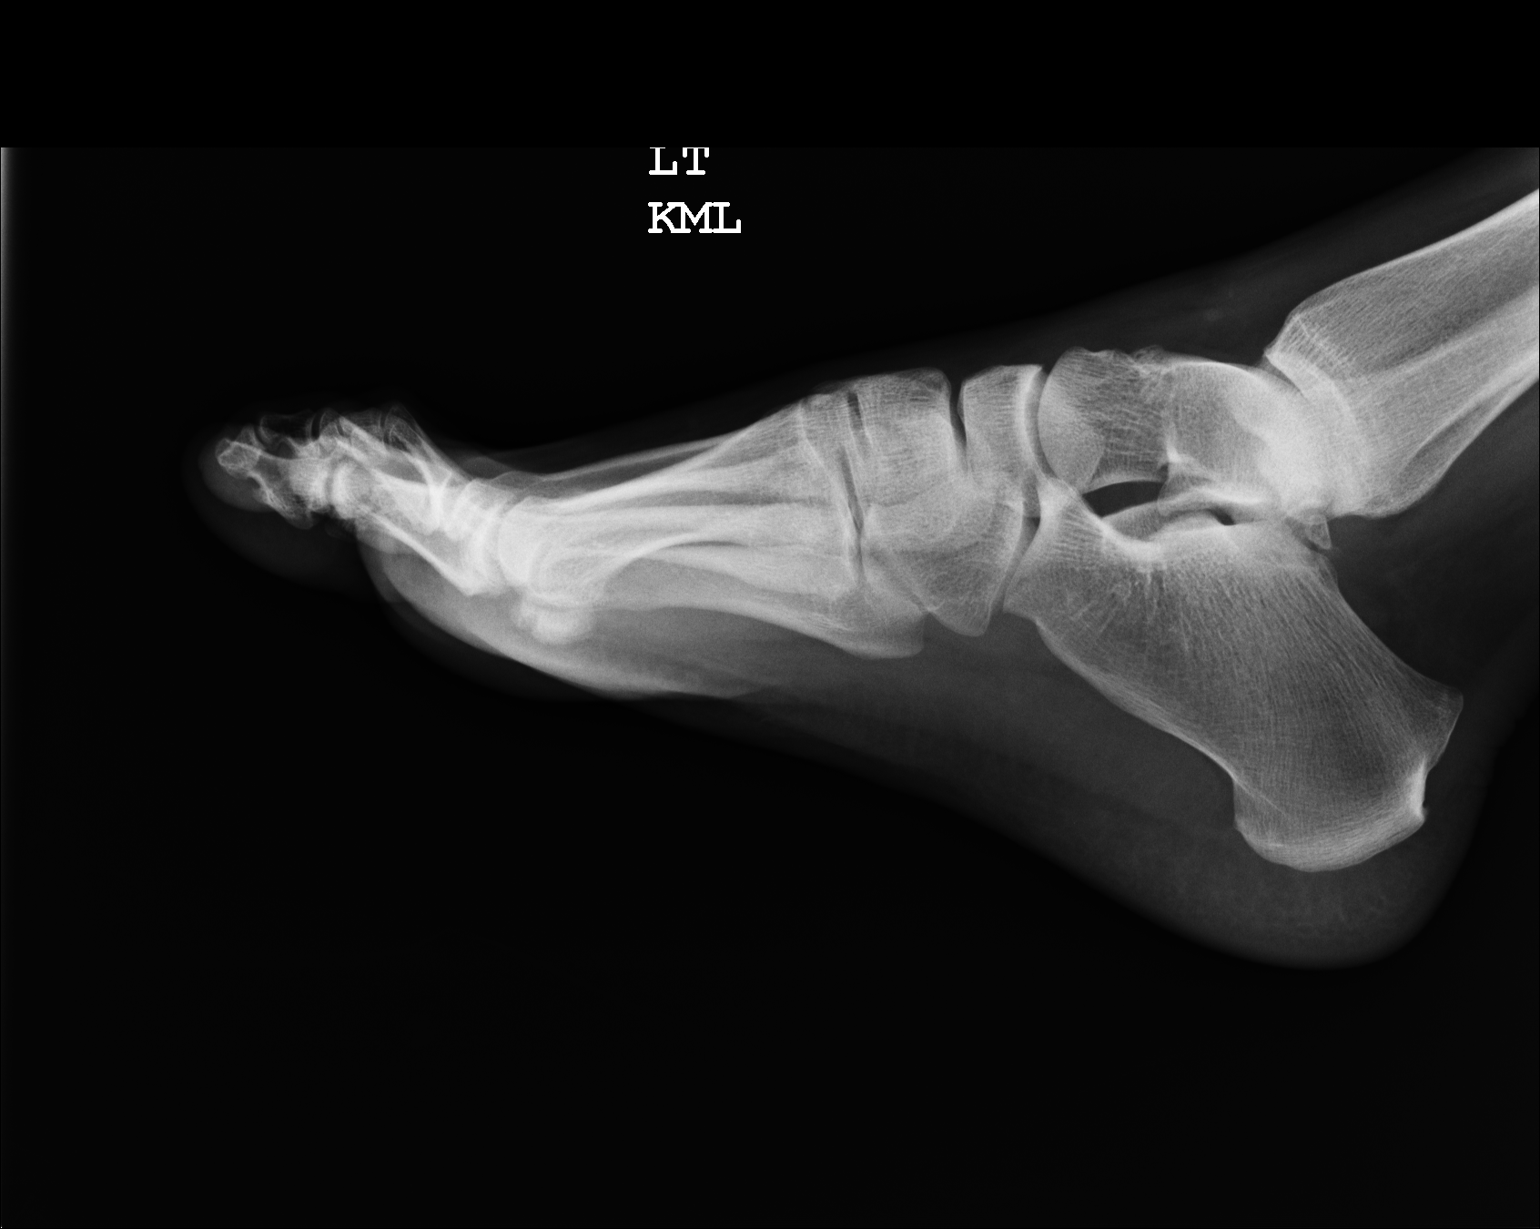

[3 of 3 positions shown; findings below may reference images not displayed]

PROCEDURE:     DXR - DXR FOOT LT COMP W/OBLIQUES  - July 30, 2007  [DATE]

RESULT:      The clinical history states, "rule out foreign body." The site
of the suspected foreign body is not known to me. Three views of the foot
reveal the bones to be adequately mineralized. The overlying soft tissues
exhibit no radiopaque foreign bodies. The soft tissues do not appear
swollen. No bony fracture is identified.
IMPRESSION: I do not see evidence of a retained foreign body or other acute abnormality
of the foot. Followup films coned to any area of persistent symptoms are
available upon request.

## 2009-11-19 IMAGING — CR RIGHT HAND - COMPLETE 3+ VIEW
1 series · 4 of 4 positions shown · non-contrast
Comparison: none

REASON FOR EXAM: pain/trauma
COMMENTS:

[Series 1: view not recorded · 0.17mm/px · 4 of 4 slices shown]
[im 1/4]
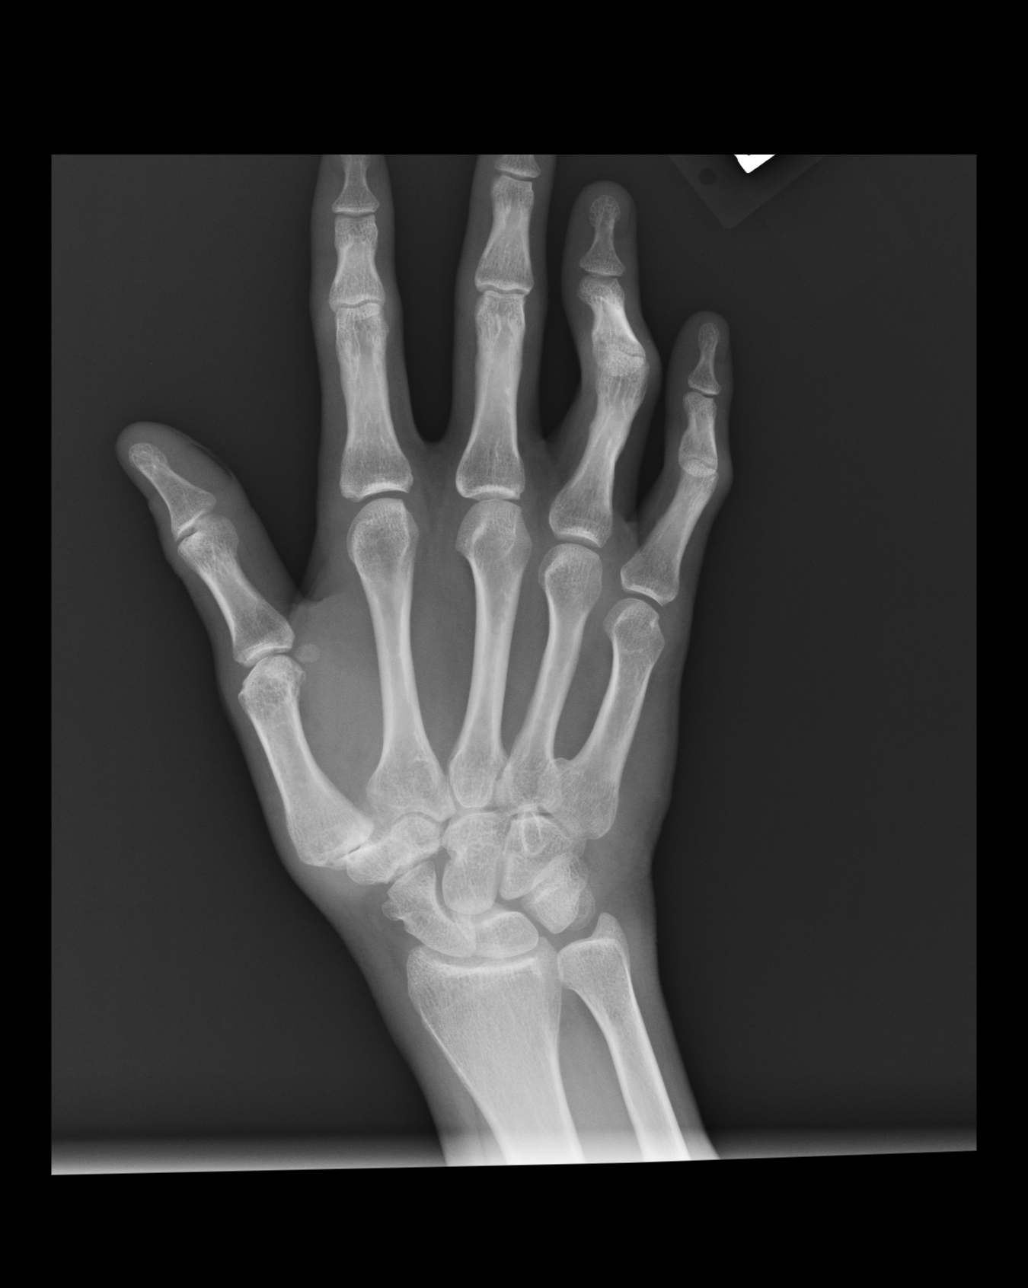
[im 2/4]
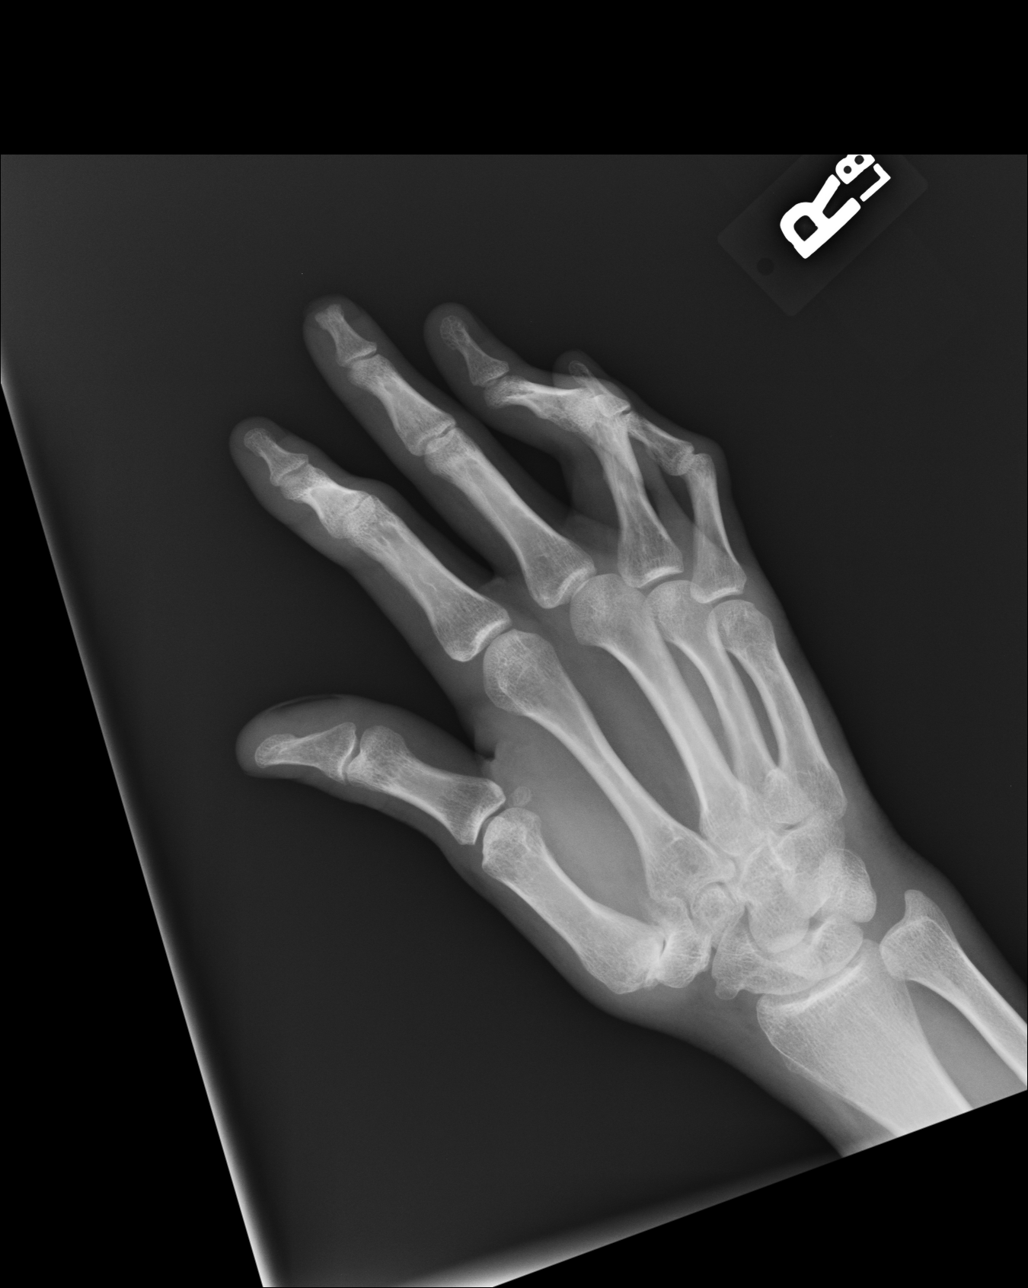
[im 3/4]
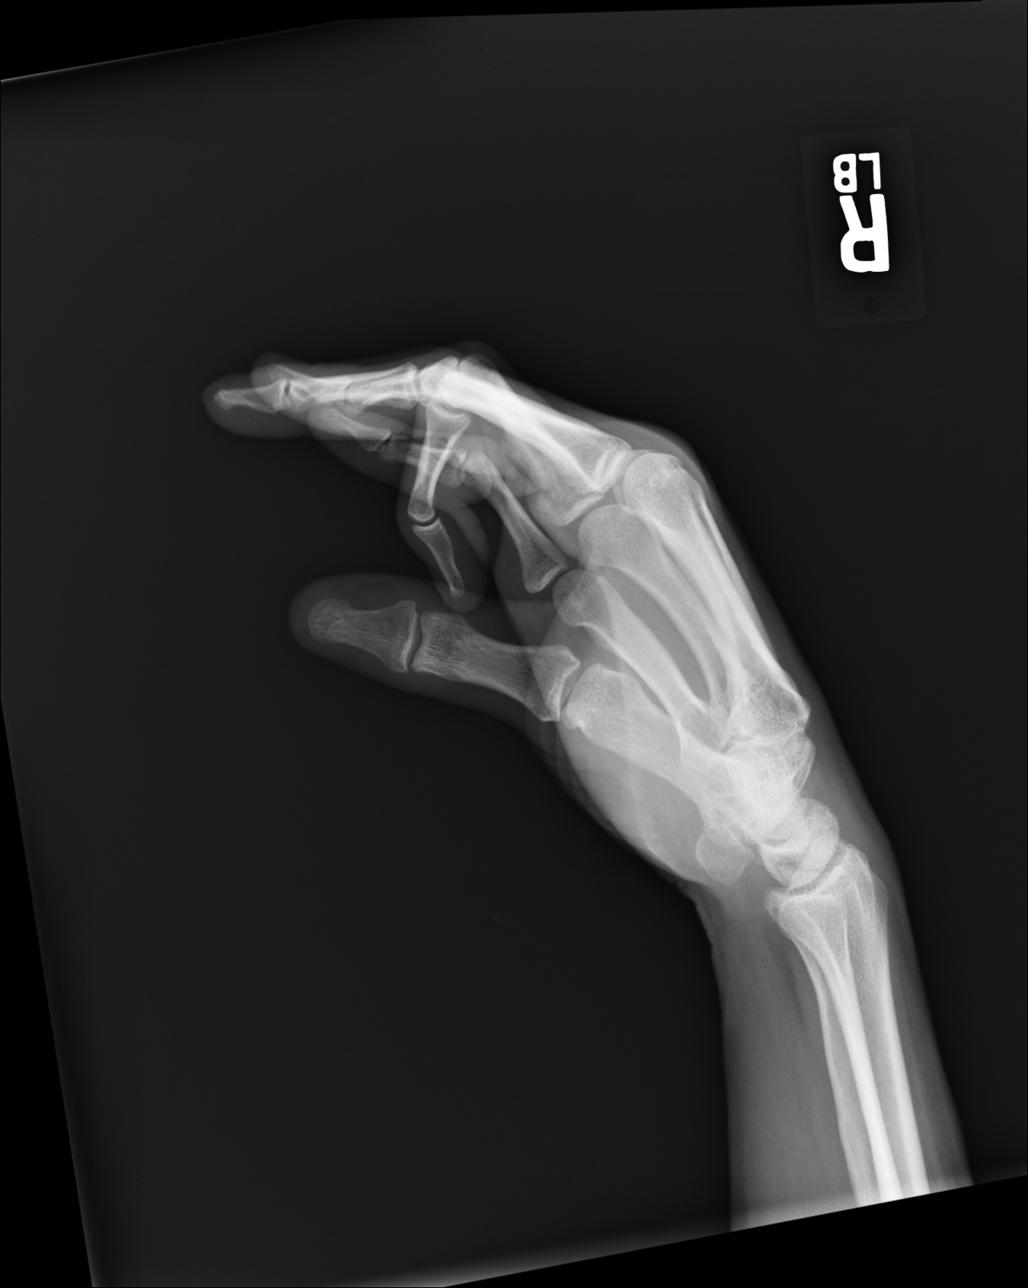
[im 4/4]
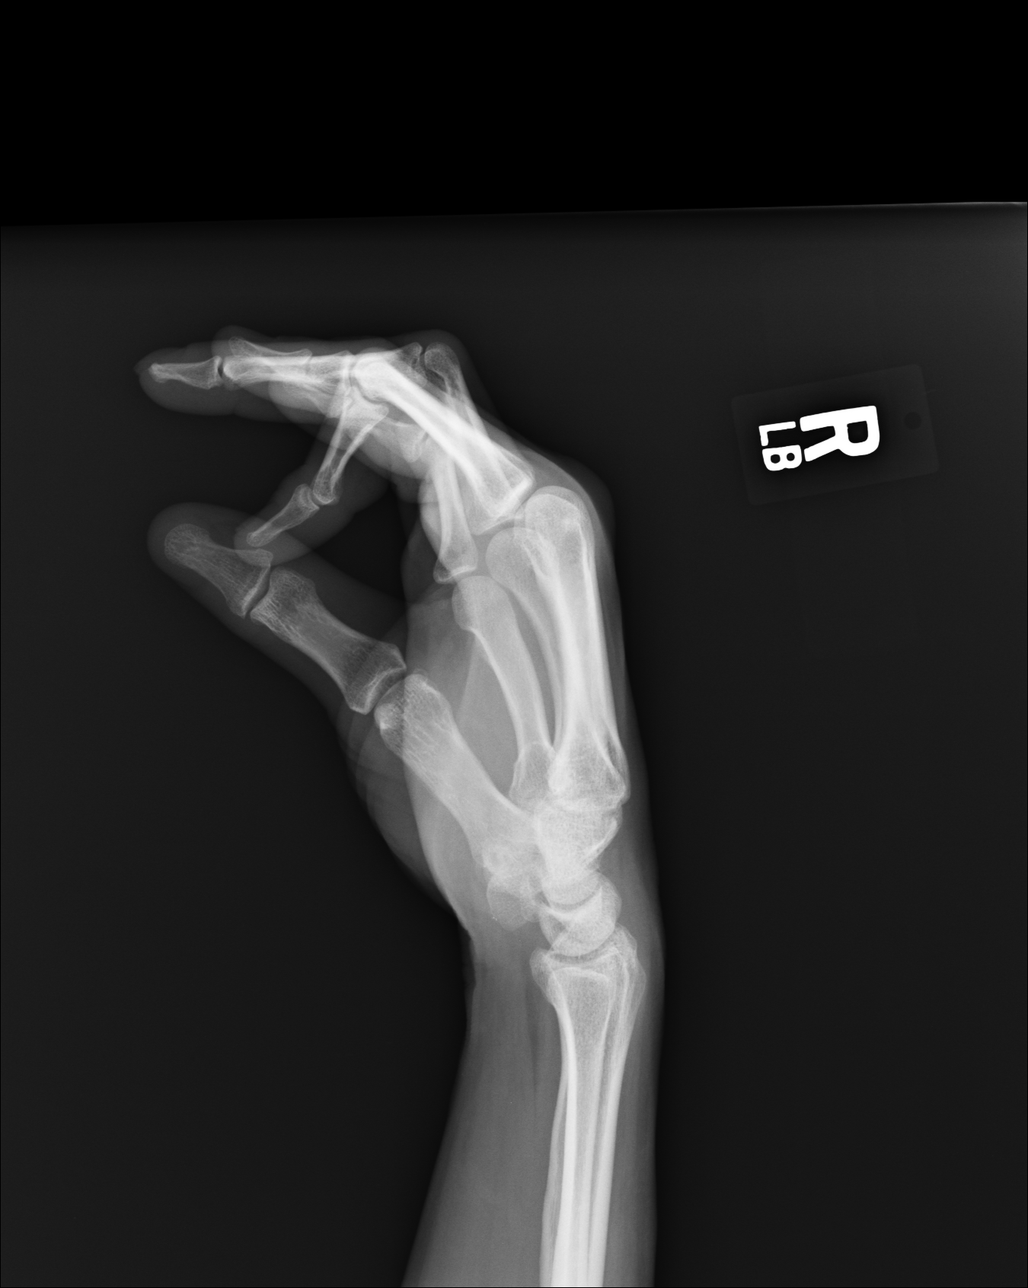

[4 of 4 positions shown; findings below may reference images not displayed]

PROCEDURE:     DXR - DXR HAND RT COMPLETE W/OBLIQUES  - October 15, 2007  [DATE]

RESULT:     Comparison is made to the prior exam of 05/17/2007. No fracture,
dislocation or other acute bony abnormality is identified. There is again
noted partial flexion at the PIP joints of the fourth and fifth fingers. The
etiology for this is not evident.
IMPRESSION: 1.     No acute bony abnormalities are identified.

## 2010-02-17 ENCOUNTER — Ambulatory Visit: Payer: Self-pay | Admitting: Internal Medicine

## 2010-02-18 ENCOUNTER — Ambulatory Visit: Payer: Self-pay | Admitting: Internal Medicine

## 2010-02-22 ENCOUNTER — Ambulatory Visit: Payer: Self-pay | Admitting: Internal Medicine

## 2010-06-02 ENCOUNTER — Inpatient Hospital Stay: Payer: Self-pay | Admitting: Psychiatry

## 2010-06-10 ENCOUNTER — Emergency Department: Payer: Self-pay | Admitting: Internal Medicine

## 2010-06-23 ENCOUNTER — Emergency Department: Payer: Self-pay | Admitting: Emergency Medicine

## 2010-10-17 IMAGING — CT CT ABD-PELV W/ CM
1 of 2 series · 15 of 32 positions shown, 19 images · non-contrast
Comparison: 11/08/2006

REASON FOR EXAM: (1) L lat and LLQ pain; (2) L lat abd and LLQ pain
COMMENTS:   LMP: L lat abd and LLQ pain

PROCEDURE:     CT  - CT ABDOMEN / PELVIS  W  - September 11, 2008 [DATE]
RESULT:     CT abdomen and pelvis
HISTORY: Pain
TECHNIQUE: Multiple axial images of the abdomen and pelvis were performed
from the lung bases to the pubic symphysis, with p.o. contrast and with 100
ml of Tsovue-TZI intravenous contrast.

[Series 2: abdomen · axial · 0.82mm/px · z∈[-490,-58]mm · 15 of 60 slices shown, 19 images]
[im 3/60  soft-tissue]
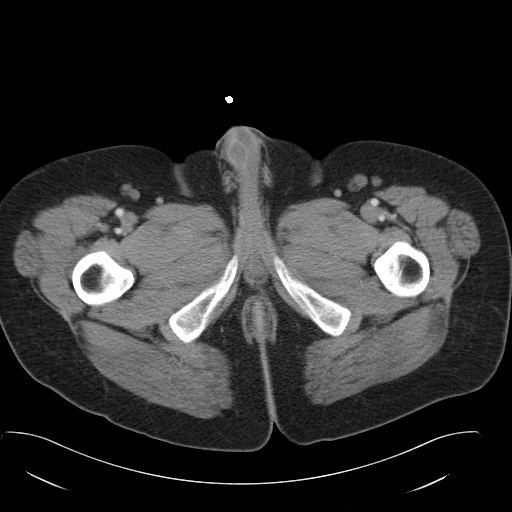
[im 3/60  bone]
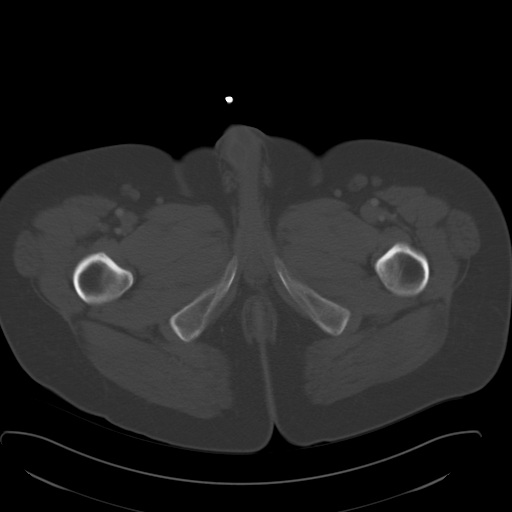
[im 8/60  soft-tissue]
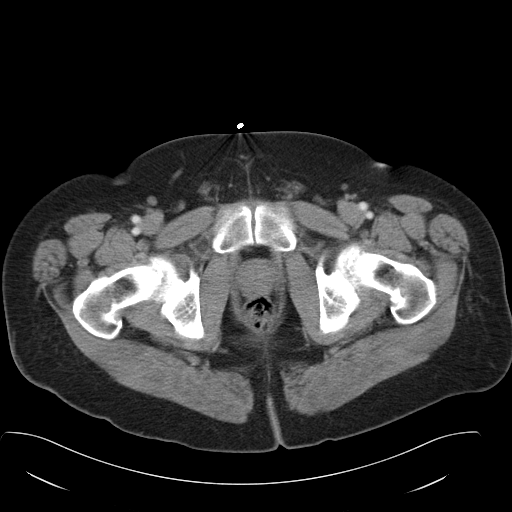
[im 12/60  soft-tissue]
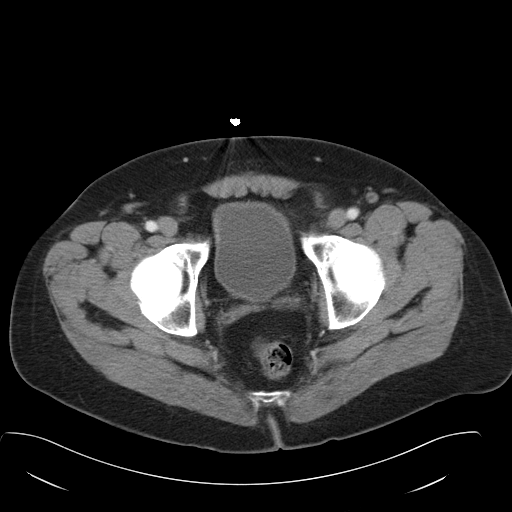
[im 17/60  soft-tissue]
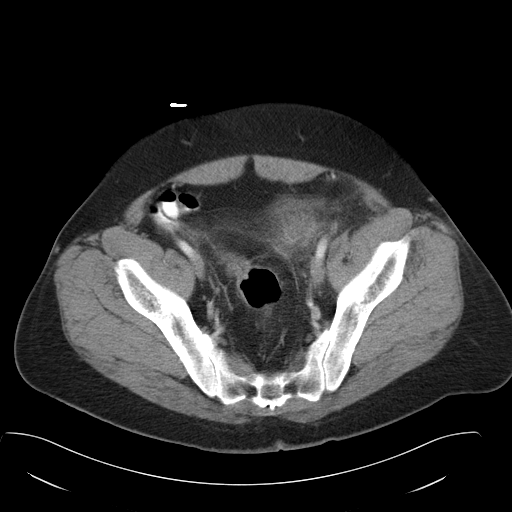
[im 22/60  soft-tissue]
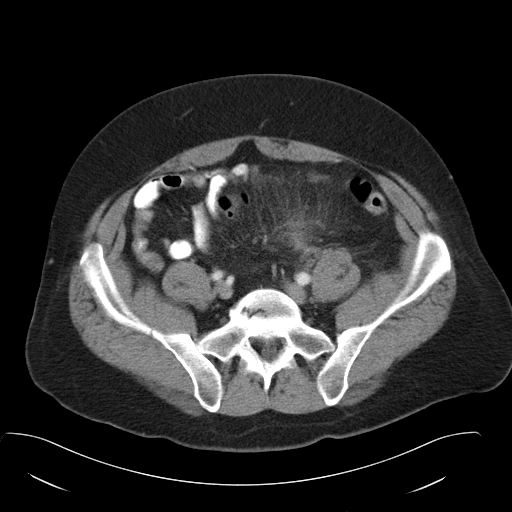
[im 26/60  soft-tissue]
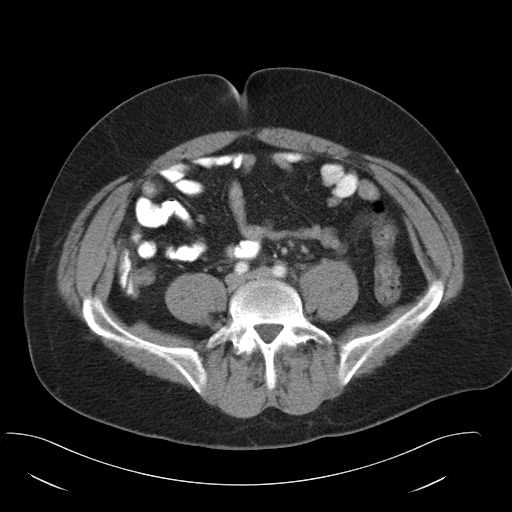
[im 31/60  soft-tissue]
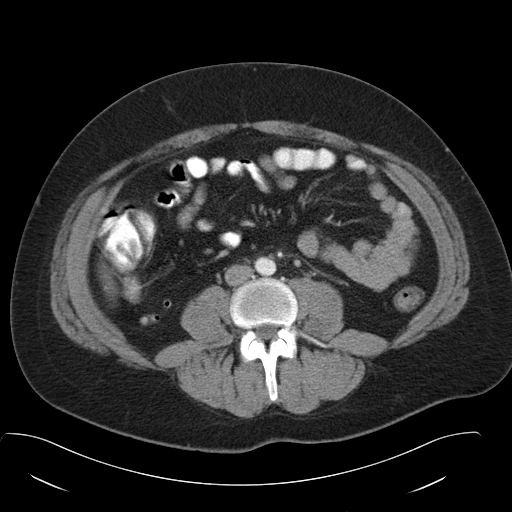
[im 34/60  soft-tissue]
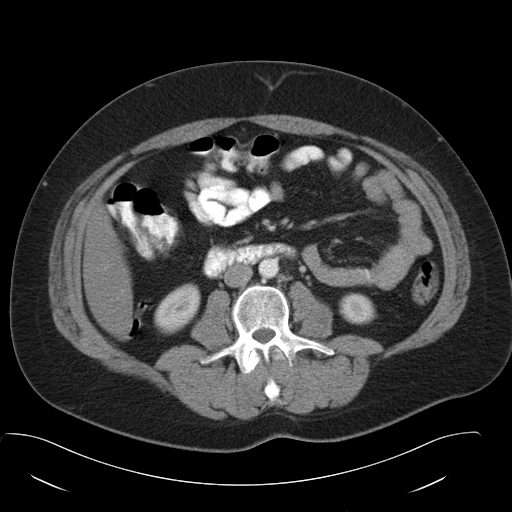
[im 38/60  soft-tissue]
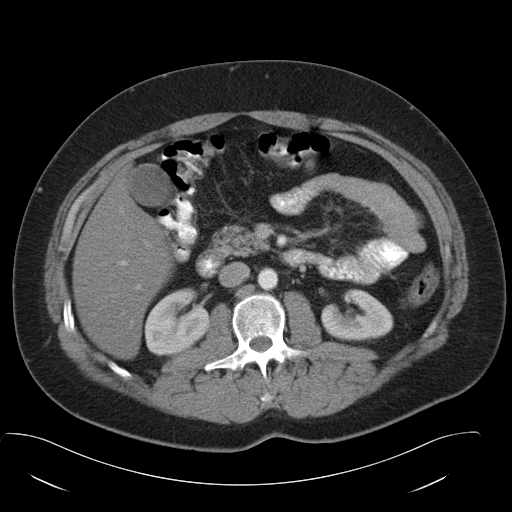
[im 38/60  bone]
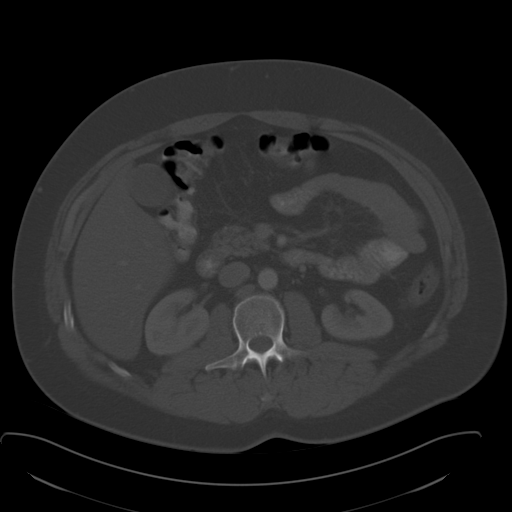
[im 43/60  soft-tissue]
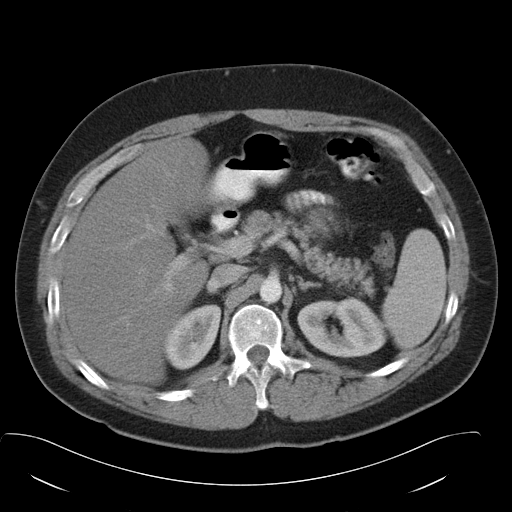
[im 48/60  soft-tissue]
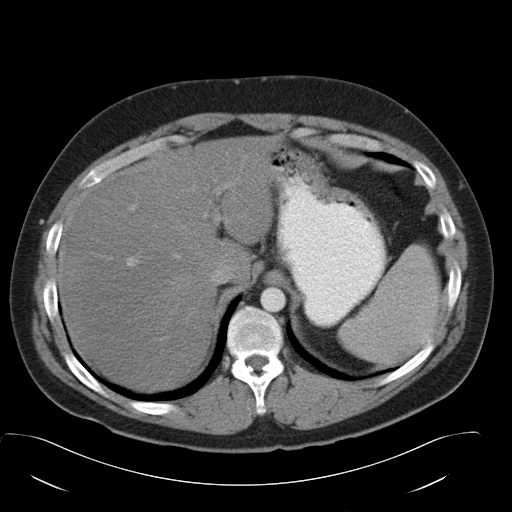
[im 50/60  lung]
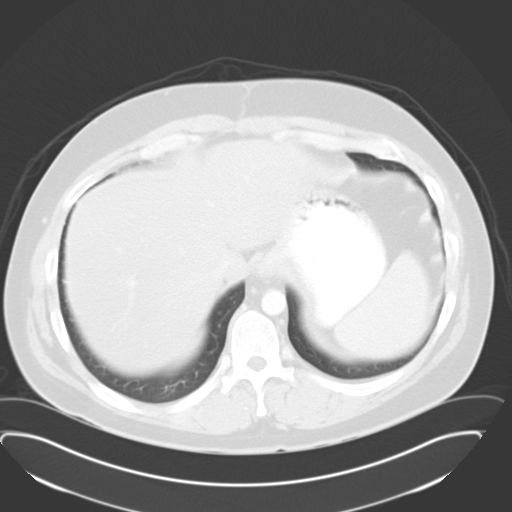
[im 52/60  soft-tissue]
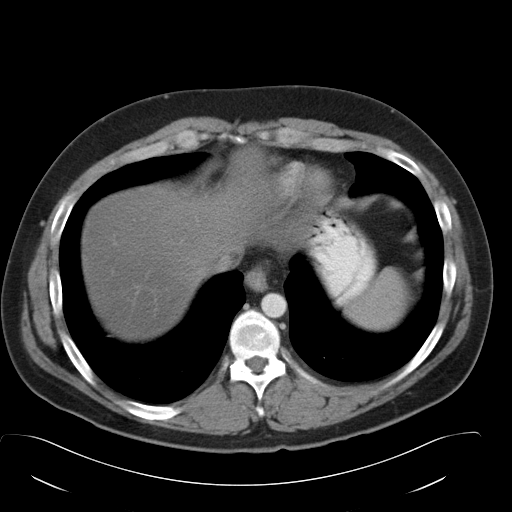
[im 52/60  lung]
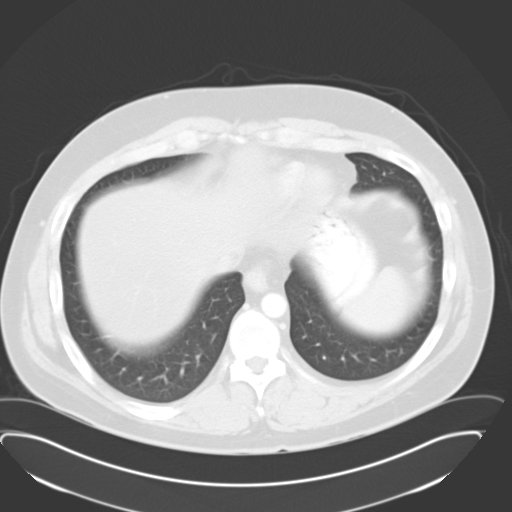
[im 55/60  lung]
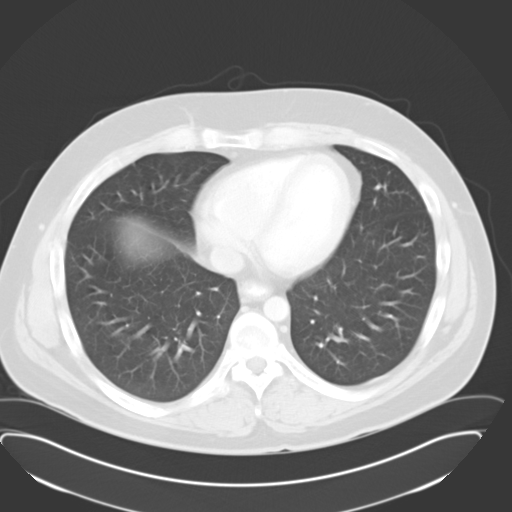
[im 57/60  soft-tissue]
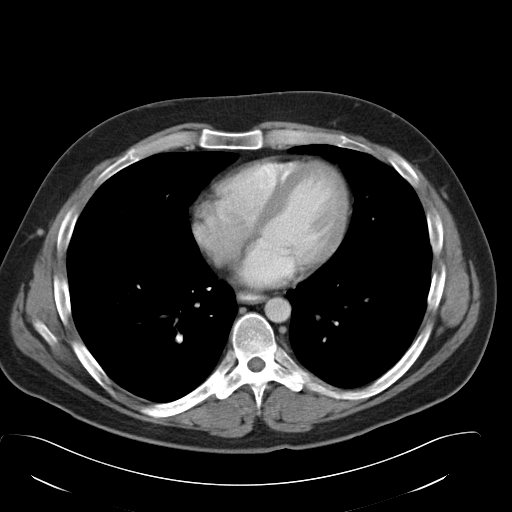
[im 57/60  lung]
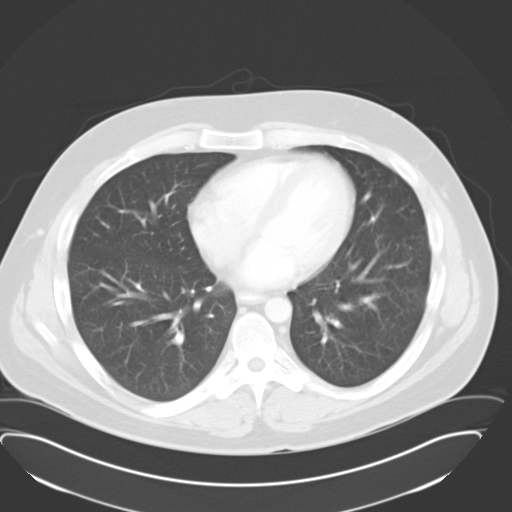

[15 of 32 positions shown; findings below may reference images not displayed]

FINDINGS: The lung bases are clear. There is no pneumothorax. The heart size is
normal.

 The liver demonstrates no focal abnormality. There is no intrahepatic or
extrahepatic biliary ductal dilatation. The gallbladder is unremarkable. The
spleen demonstrates no focal abnormality. The kidneys, adrenal glands, and
pancreas are normal. The bladder is unremarkable.

 The stomach, duodenum, small intestine, and large intestine demonstrate no
contrast extravasation or dilatation. There are scattered diverticula
involving the sigmoid colon. There is mild wall thickening involving the
proximal sigmoid colon with mild perisigmoidal inflammatory changes. There
is an adjacent focal area of soft tissue density abutting the sigmoid colon
wall. A normal caliber, air and contrast-filled appendix is visualized in
the right lower quadrant. There is no pneumoperitoneum, pneumatosis, or
portal venous gas. There is no abdominal or pelvic free fluid. There is no
lymphadenopathy.

 The abdominal aorta is normal in caliber.

 The osseous structures are unremarkable.
IMPRESSION: There are scattered diverticula involving the sigmoid colon. There is mild
wall thickening involving the proximal sigmoid colon with mild perisigmoidal
inflammatory changes. There is an adjacent focal area of soft tissue density
abutting the sigmoid colon wall. This likely represents diverticulitis with
adjacent phlegmonous change. There is no drainable fluid collection.

## 2010-12-10 IMAGING — CR RIGHT HAND - COMPLETE 3+ VIEW
1 series · 4 of 4 positions shown · non-contrast
Comparison: none

REASON FOR EXAM: Fall, pain with deformity
COMMENTS:

[Series 1: view not recorded · 0.17mm/px · 4 of 4 slices shown]
[im 1/4]
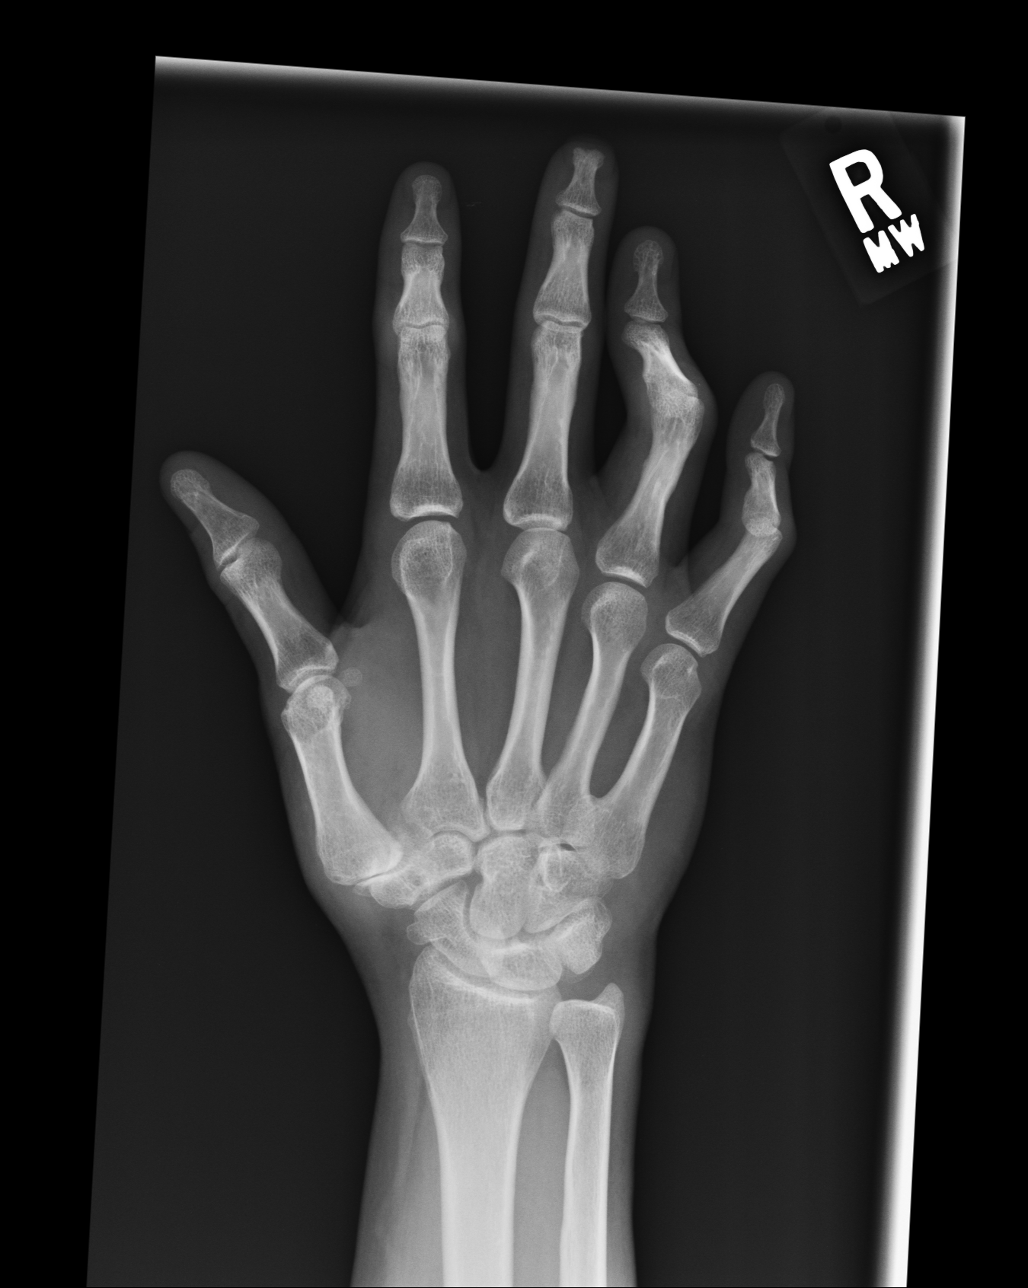
[im 2/4]
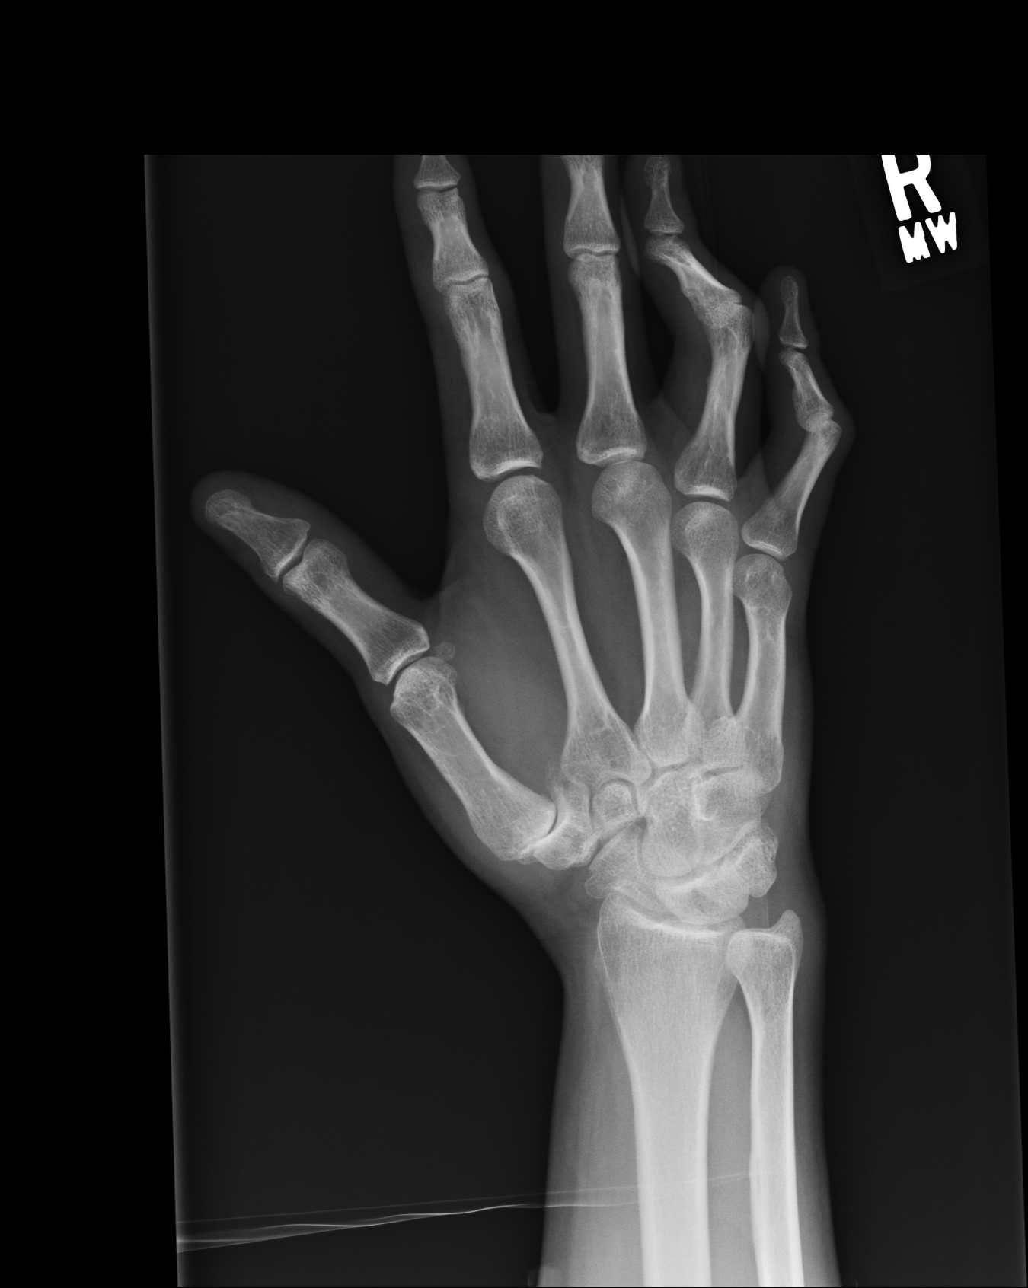
[im 3/4]
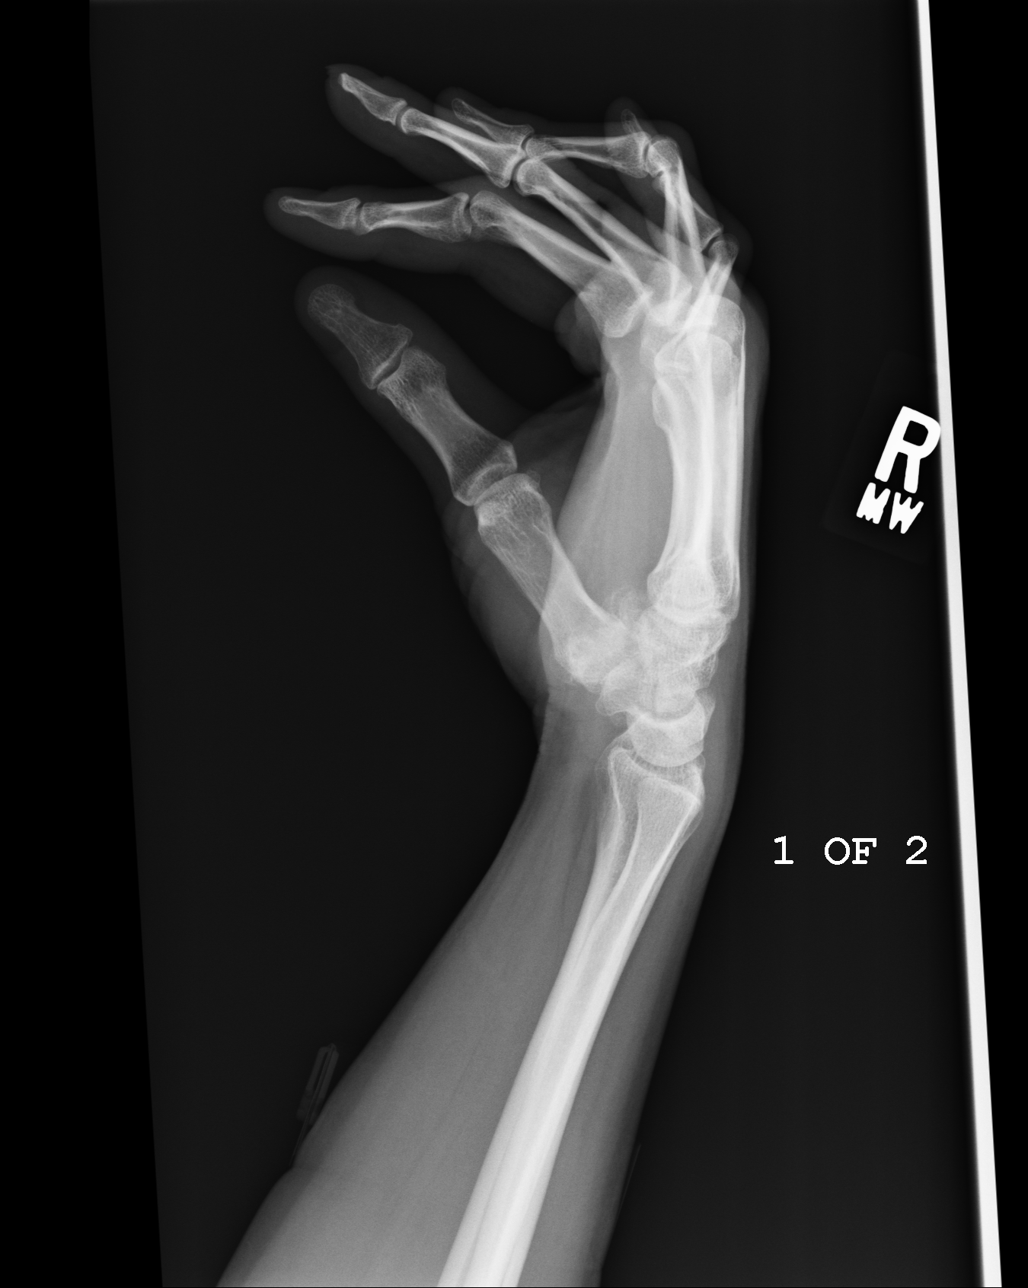
[im 4/4]
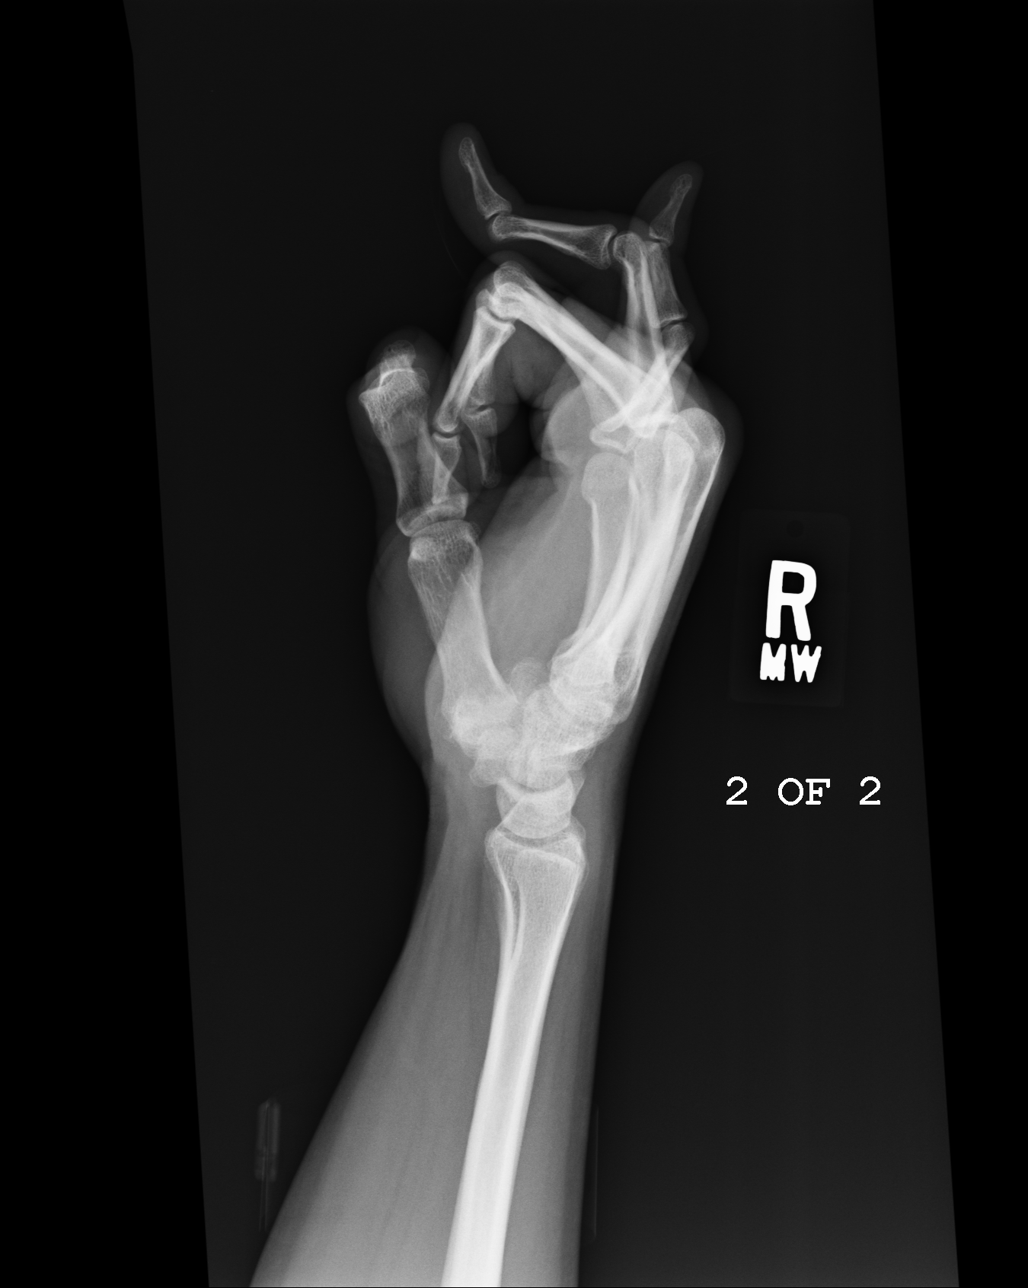

[4 of 4 positions shown; findings below may reference images not displayed]

PROCEDURE:     DXR - DXR HAND RT COMPLETE W/OBLIQUES  - November 04, 2008  [DATE]

RESULT:     Comparison is made to previous examinations dated 05/17/2007 and
10/15/2007.

First carpometacarpal degenerative changes are present. There is a chronic
flexion deformity of the fourth proximal interphalangeal joint and of the
proximal fifth interphalangeal joint. No definite fracture is evident.
IMPRESSION: Please see above.

## 2011-09-12 ENCOUNTER — Ambulatory Visit: Payer: Self-pay

## 2011-10-09 ENCOUNTER — Emergency Department: Payer: Self-pay | Admitting: Unknown Physician Specialty

## 2012-02-25 ENCOUNTER — Ambulatory Visit: Payer: Self-pay | Admitting: Internal Medicine

## 2012-03-25 ENCOUNTER — Ambulatory Visit: Payer: Self-pay

## 2012-03-29 IMAGING — CR RIGHT HAND - COMPLETE 3+ VIEW
1 series · 3 of 3 positions shown · non-contrast
Comparison: none

REASON FOR EXAM: pain denies trauma
COMMENTS:

[Series 1: view not recorded · 0.17mm/px · 3 of 3 slices shown]
[im 1/3]
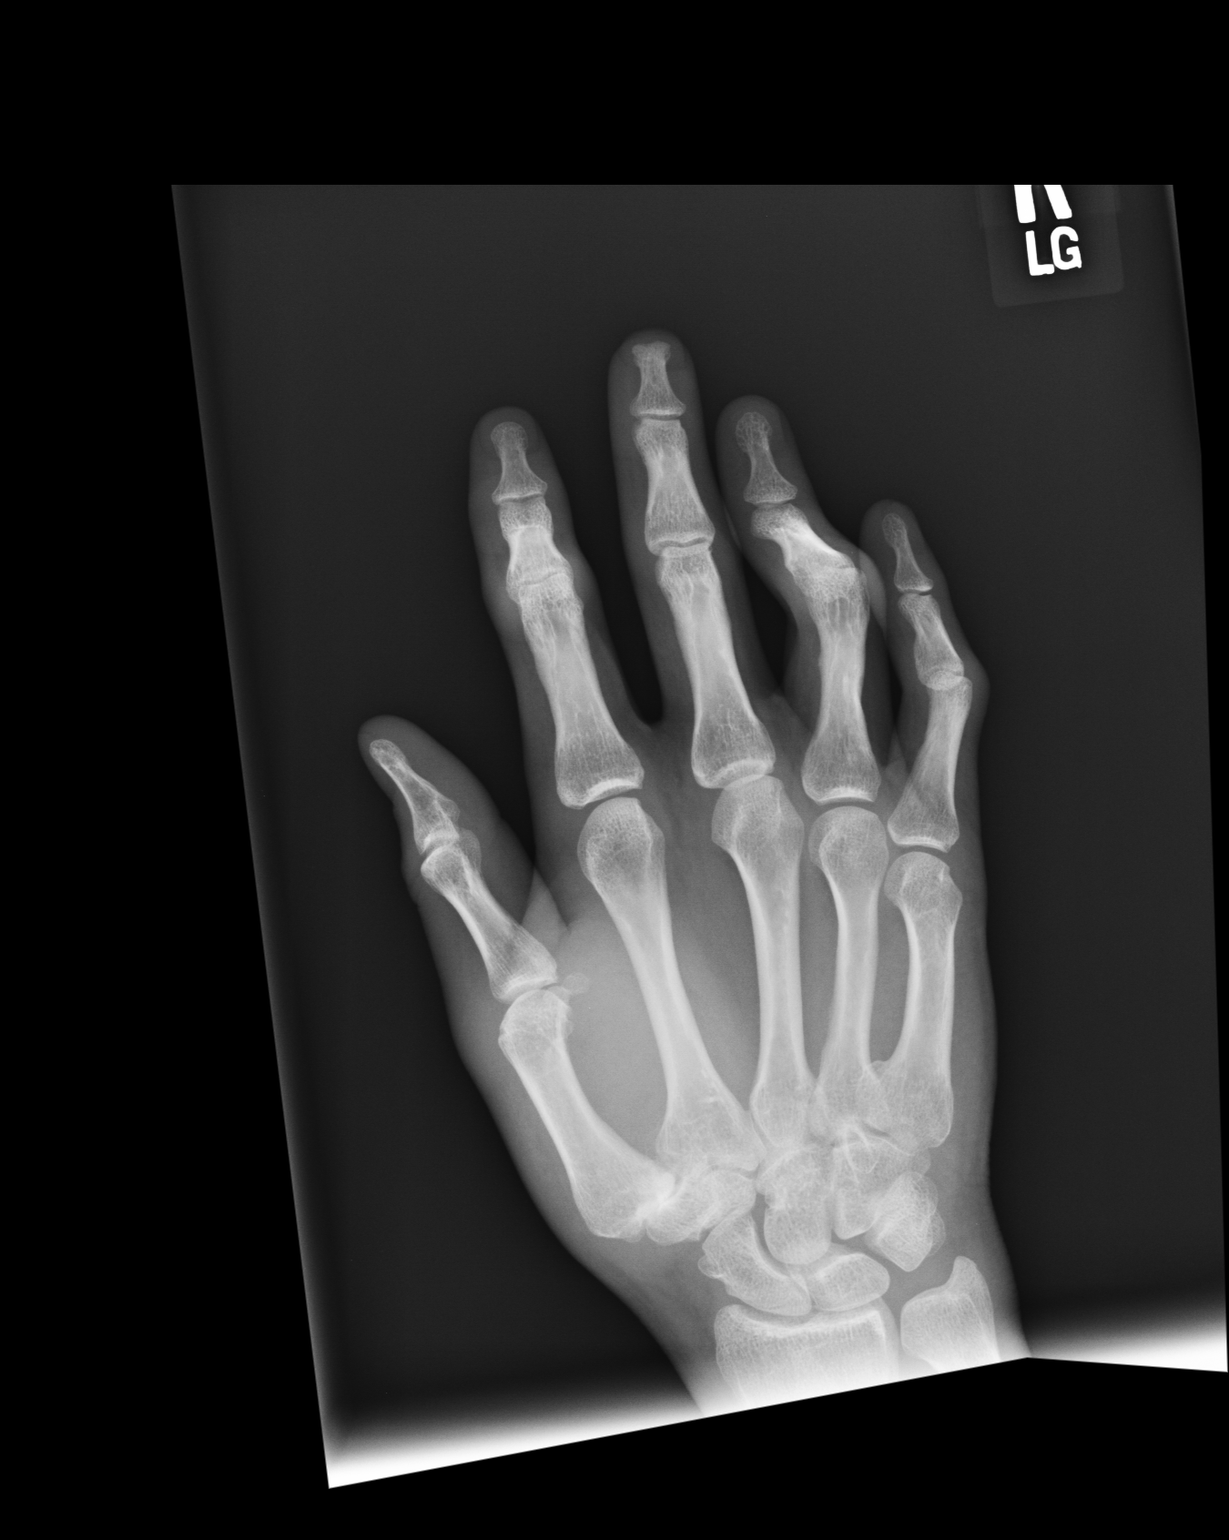
[im 2/3]
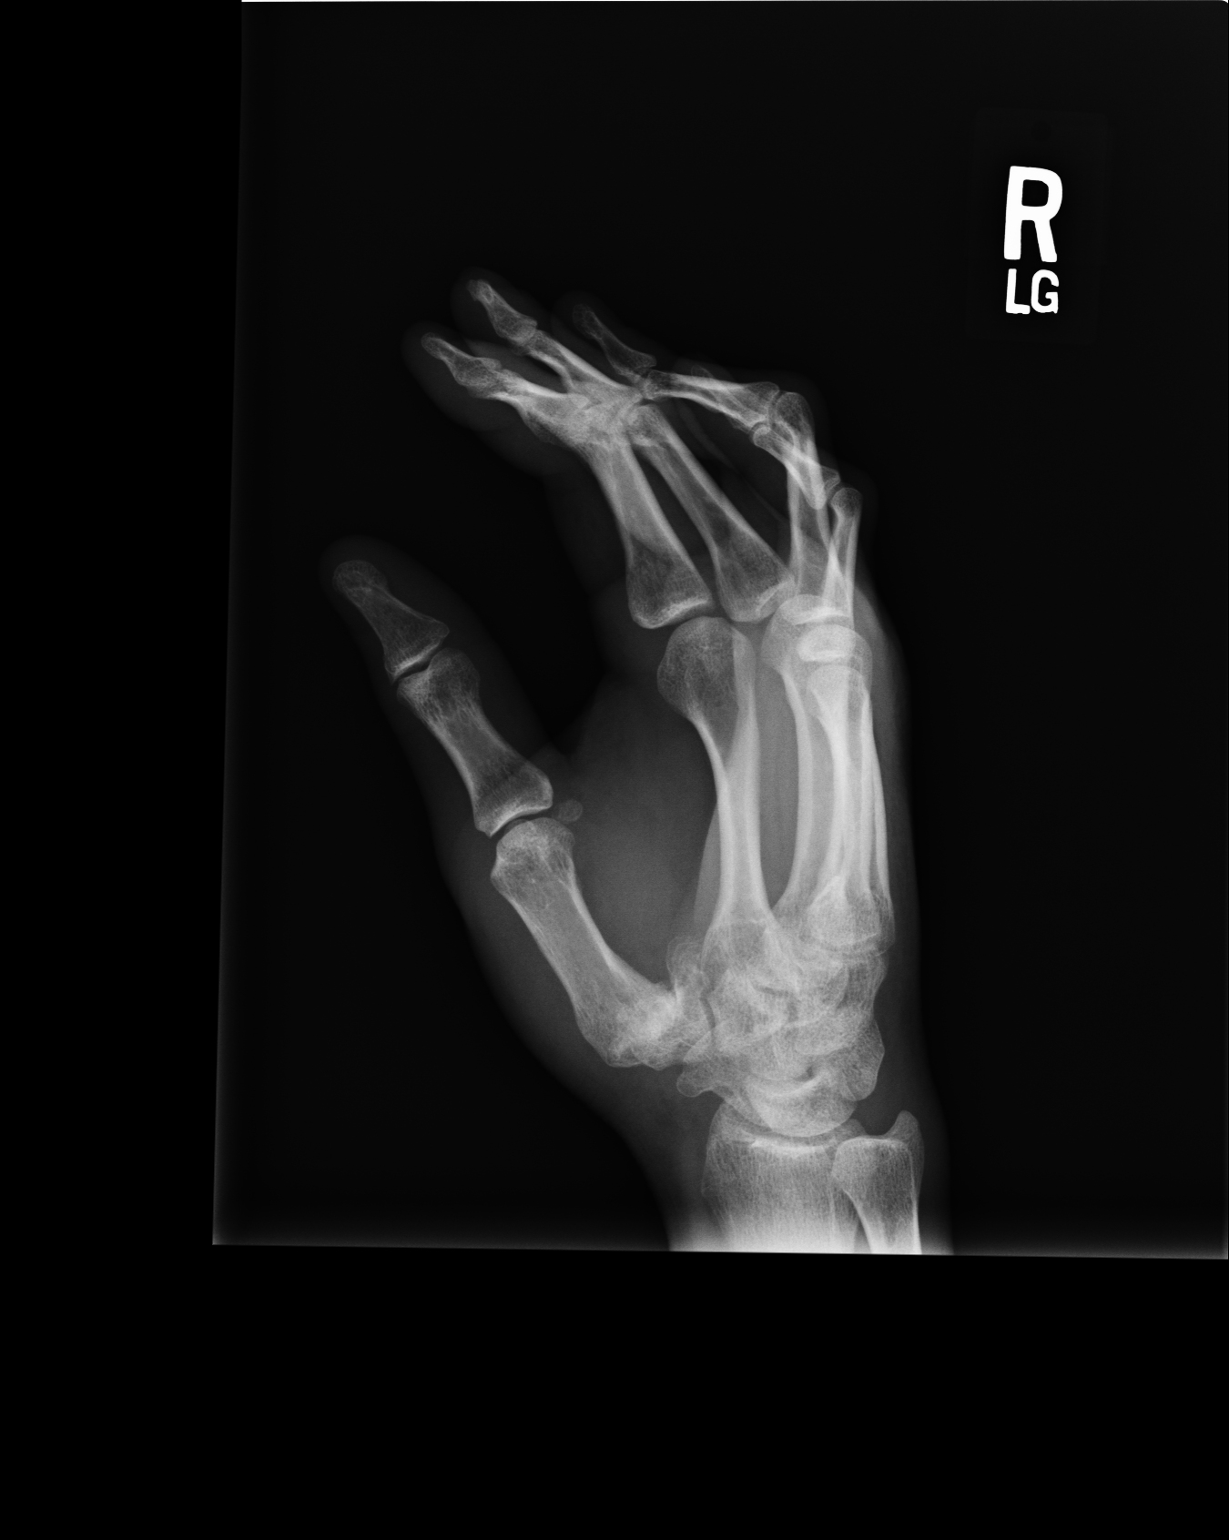
[im 3/3]
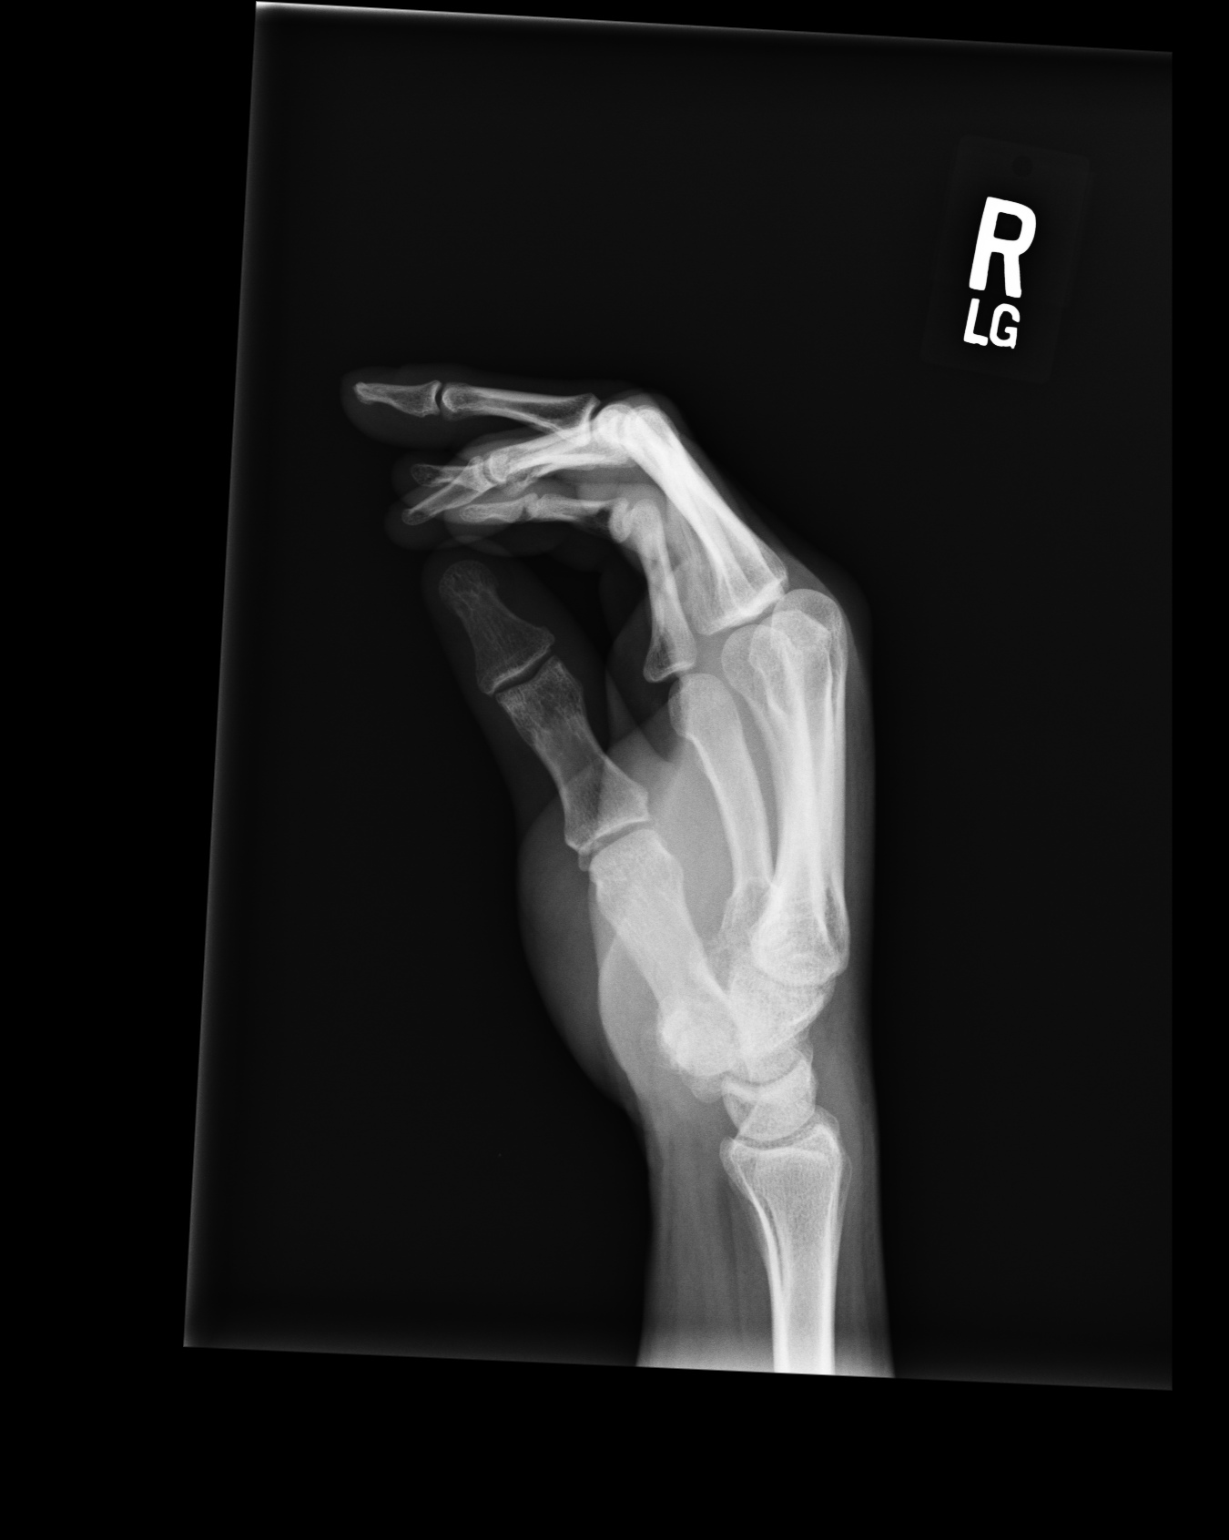

[3 of 3 positions shown; findings below may reference images not displayed]

PROCEDURE:     MDR - MDR HAND RT COMP W/OBLIQUES  - February 22, 2010  [DATE]

RESULT:     Three views of the right hand are submitted. Comparison is made
to the study 04 November, 2008.

There is soft tissue swelling over the thenar region. I do not see soft
tissue gas or foreign bodies. There are degenerative changes of the first
carpometacarpal joint which are not new. There is degenerative change of the
PIP joint of the fourth finger with a flexion contracture which is stable.
Similar milder findings are noted involving the fifth digit at the PIP joint.
IMPRESSION: 1. There is soft tissue swelling consistent with the clinically suspected
cellulitis over the thenar region. I see some no soft tissue gas or foreign
bodies.
2. There are degenerative changes noted of the first carpometacarpal joint
and of several interphalangeal joints.

## 2012-05-28 ENCOUNTER — Emergency Department: Payer: Self-pay | Admitting: *Deleted

## 2012-06-02 ENCOUNTER — Ambulatory Visit: Payer: Self-pay | Admitting: Family Medicine

## 2012-06-04 ENCOUNTER — Emergency Department: Payer: Self-pay | Admitting: Emergency Medicine

## 2012-06-04 LAB — COMPREHENSIVE METABOLIC PANEL
Albumin: 3.6 g/dL (ref 3.4–5.0)
Alkaline Phosphatase: 251 U/L — ABNORMAL HIGH (ref 50–136)
BUN: 2 mg/dL — ABNORMAL LOW (ref 7–18)
Bilirubin,Total: 2.6 mg/dL — ABNORMAL HIGH (ref 0.2–1.0)
Calcium, Total: 8.8 mg/dL (ref 8.5–10.1)
Chloride: 101 mmol/L (ref 98–107)
Co2: 23 mmol/L (ref 21–32)
Creatinine: 0.74 mg/dL (ref 0.60–1.30)
EGFR (Non-African Amer.): >60
Glucose: 98 mg/dL (ref 65–99)
Osmolality: 268 (ref 275–301)
SGOT(AST): 416 U/L — ABNORMAL HIGH (ref 15–37)
SGPT (ALT): 134 U/L — ABNORMAL HIGH
Total Protein: 7.9 g/dL (ref 6.4–8.2)

## 2012-06-04 LAB — CBC
HGB: 14.8 g/dL (ref 13.0–18.0)
MCHC: 34.2 g/dL (ref 32.0–36.0)
Platelet: 177 10*3/uL (ref 150–440)
RBC: 4.16 10*6/uL — ABNORMAL LOW (ref 4.40–5.90)
WBC: 5.2 10*3/uL (ref 3.8–10.6)

## 2012-06-04 LAB — ETHANOL: Ethanol: 297 mg/dL

## 2012-06-09 ENCOUNTER — Emergency Department: Payer: Self-pay | Admitting: Emergency Medicine

## 2012-06-09 LAB — COMPREHENSIVE METABOLIC PANEL
Albumin: 3.6 g/dL (ref 3.4–5.0)
Anion Gap: 13 (ref 7–16)
BUN: 2 mg/dL — ABNORMAL LOW (ref 7–18)
Calcium, Total: 8.9 mg/dL (ref 8.5–10.1)
Chloride: 100 mmol/L (ref 98–107)
Co2: 23 mmol/L (ref 21–32)
EGFR (African American): >60
EGFR (Non-African Amer.): >60
Glucose: 61 mg/dL — ABNORMAL LOW (ref 65–99)
Osmolality: 266 (ref 275–301)
Potassium: 3 mmol/L — ABNORMAL LOW (ref 3.5–5.1)
Sodium: 136 mmol/L (ref 136–145)

## 2012-06-09 LAB — CBC
HGB: 15.2 g/dL (ref 13.0–18.0)
MCHC: 34.7 g/dL (ref 32.0–36.0)
MCV: 104 fL — ABNORMAL HIGH (ref 80–100)
RBC: 4.23 10*6/uL — ABNORMAL LOW (ref 4.40–5.90)

## 2012-06-09 LAB — ETHANOL
Ethanol %: 0.255 % — ABNORMAL HIGH (ref 0.000–0.080)
Ethanol: 255 mg/dL

## 2012-06-09 LAB — URINALYSIS, COMPLETE
Bilirubin,UR: NEGATIVE
Blood: NEGATIVE
Glucose,UR: 50 mg/dL (ref 0–75)
Ketone: NEGATIVE
Ph: 6 (ref 4.5–8.0)
Protein: 30
RBC,UR: <1 /HPF (ref 0–5)
Specific Gravity: 1.004 (ref 1.003–1.030)
Squamous Epithelial: NONE SEEN

## 2012-06-09 LAB — MAGNESIUM: Magnesium: 1.9 mg/dL

## 2012-06-09 LAB — DRUG SCREEN, URINE
Amphetamines, Ur Screen: NEGATIVE (ref ?–1000)
Cocaine Metabolite,Ur ~~LOC~~: NEGATIVE (ref ?–300)
MDMA (Ecstasy)Ur Screen: NEGATIVE (ref ?–500)
Methadone, Ur Screen: NEGATIVE (ref ?–300)
Opiate, Ur Screen: NEGATIVE (ref ?–300)
Phencyclidine (PCP) Ur S: NEGATIVE (ref ?–25)
Tricyclic, Ur Screen: NEGATIVE (ref ?–1000)

## 2012-06-09 LAB — TSH: Thyroid Stimulating Horm: 1.15 u[IU]/mL

## 2012-06-09 LAB — CK TOTAL AND CKMB (NOT AT ARMC): CK-MB: 0.9 ng/mL (ref 0.5–3.6)

## 2012-06-09 LAB — SALICYLATE LEVEL: Salicylates, Serum: <1.7 mg/dL

## 2012-06-09 LAB — LIPASE, BLOOD: Lipase: 369 U/L (ref 73–393)

## 2012-06-10 ENCOUNTER — Emergency Department: Payer: Self-pay | Admitting: Emergency Medicine

## 2012-06-10 LAB — ACETAMINOPHEN LEVEL: Acetaminophen: 9 ug/mL — ABNORMAL LOW

## 2012-06-10 LAB — URINALYSIS, COMPLETE
Bilirubin,UR: NEGATIVE
Glucose,UR: 50 mg/dL (ref 0–75)
Ketone: NEGATIVE
Ph: 6 (ref 4.5–8.0)
Protein: NEGATIVE
RBC,UR: NONE SEEN /HPF (ref 0–5)
Specific Gravity: 1.003 (ref 1.003–1.030)
Squamous Epithelial: NONE SEEN

## 2012-06-10 LAB — COMPREHENSIVE METABOLIC PANEL
Albumin: 3.6 g/dL (ref 3.4–5.0)
Alkaline Phosphatase: 280 U/L — ABNORMAL HIGH (ref 50–136)
Bilirubin,Total: 3.5 mg/dL — ABNORMAL HIGH (ref 0.2–1.0)
Chloride: 103 mmol/L (ref 98–107)
Co2: 25 mmol/L (ref 21–32)
Creatinine: 0.73 mg/dL (ref 0.60–1.30)
EGFR (African American): >60
EGFR (Non-African Amer.): >60
Glucose: 56 mg/dL — ABNORMAL LOW (ref 65–99)
Osmolality: 267 (ref 275–301)
Sodium: 137 mmol/L (ref 136–145)

## 2012-06-10 LAB — CBC
HCT: 42.9 % (ref 40.0–52.0)
HGB: 14.6 g/dL (ref 13.0–18.0)
MCH: 35.5 pg — ABNORMAL HIGH (ref 26.0–34.0)
WBC: 5 10*3/uL (ref 3.8–10.6)

## 2012-06-10 LAB — DRUG SCREEN, URINE
Amphetamines, Ur Screen: NEGATIVE (ref ?–1000)
Barbiturates, Ur Screen: NEGATIVE (ref ?–200)
Benzodiazepine, Ur Scrn: NEGATIVE (ref ?–200)
Cannabinoid 50 Ng, Ur ~~LOC~~: NEGATIVE (ref ?–50)
Cocaine Metabolite,Ur ~~LOC~~: NEGATIVE (ref ?–300)
Methadone, Ur Screen: NEGATIVE (ref ?–300)
Tricyclic, Ur Screen: NEGATIVE (ref ?–1000)

## 2012-06-10 LAB — SALICYLATE LEVEL: Salicylates, Serum: <1.7 mg/dL

## 2012-06-10 LAB — ETHANOL: Ethanol %: 0.229 % — ABNORMAL HIGH (ref 0.000–0.080)

## 2012-06-10 LAB — TSH: Thyroid Stimulating Horm: 1.8 u[IU]/mL

## 2012-06-12 ENCOUNTER — Inpatient Hospital Stay: Payer: Self-pay | Admitting: Specialist

## 2012-06-12 LAB — DRUG SCREEN, URINE
Amphetamines, Ur Screen: NEGATIVE (ref ?–1000)
Barbiturates, Ur Screen: NEGATIVE (ref ?–200)
Cocaine Metabolite,Ur ~~LOC~~: NEGATIVE (ref ?–300)
Methadone, Ur Screen: NEGATIVE (ref ?–300)
Opiate, Ur Screen: NEGATIVE (ref ?–300)
Phencyclidine (PCP) Ur S: NEGATIVE (ref ?–25)

## 2012-06-12 LAB — COMPREHENSIVE METABOLIC PANEL
Albumin: 3.2 g/dL — ABNORMAL LOW (ref 3.4–5.0)
Alkaline Phosphatase: 253 U/L — ABNORMAL HIGH (ref 50–136)
BUN: 2 mg/dL — ABNORMAL LOW (ref 7–18)
Chloride: 101 mmol/L (ref 98–107)
Creatinine: 0.77 mg/dL (ref 0.60–1.30)
EGFR (Non-African Amer.): >60
Potassium: 3.5 mmol/L (ref 3.5–5.1)
SGOT(AST): 325 U/L — ABNORMAL HIGH (ref 15–37)
SGPT (ALT): 114 U/L — ABNORMAL HIGH

## 2012-06-12 LAB — URINALYSIS, COMPLETE
Bacteria: NONE SEEN
Bilirubin,UR: NEGATIVE
Blood: NEGATIVE
Glucose,UR: 50 mg/dL (ref 0–75)
Ketone: NEGATIVE
Leukocyte Esterase: NEGATIVE
Nitrite: NEGATIVE
Ph: 6 (ref 4.5–8.0)
RBC,UR: NONE SEEN /HPF (ref 0–5)
Specific Gravity: 1.002 (ref 1.003–1.030)
Squamous Epithelial: NONE SEEN
WBC UR: <1 /HPF (ref 0–5)

## 2012-06-12 LAB — CBC
HCT: 39.3 % — ABNORMAL LOW (ref 40.0–52.0)
HGB: 13.1 g/dL (ref 13.0–18.0)
MCHC: 33.3 g/dL (ref 32.0–36.0)
RBC: 3.75 10*6/uL — ABNORMAL LOW (ref 4.40–5.90)

## 2012-06-12 LAB — PROTIME-INR
INR: 1
Prothrombin Time: 13.4 secs (ref 11.5–14.7)

## 2012-06-12 LAB — SALICYLATE LEVEL: Salicylates, Serum: <1.7 mg/dL

## 2012-06-12 LAB — TSH: Thyroid Stimulating Horm: 1.28 u[IU]/mL

## 2012-06-12 LAB — ETHANOL
Ethanol %: 0.197 % — ABNORMAL HIGH (ref 0.000–0.080)
Ethanol: 197 mg/dL

## 2012-06-13 ENCOUNTER — Inpatient Hospital Stay: Payer: Self-pay | Admitting: Psychiatry

## 2012-06-13 LAB — COMPREHENSIVE METABOLIC PANEL
Albumin: 2.9 g/dL — ABNORMAL LOW (ref 3.4–5.0)
Alkaline Phosphatase: 227 U/L — ABNORMAL HIGH (ref 50–136)
Anion Gap: 8 (ref 7–16)
BUN: 2 mg/dL — ABNORMAL LOW (ref 7–18)
Bilirubin,Total: 4.8 mg/dL — ABNORMAL HIGH (ref 0.2–1.0)
Calcium, Total: 8.7 mg/dL (ref 8.5–10.1)
Creatinine: 0.77 mg/dL (ref 0.60–1.30)
Glucose: 111 mg/dL — ABNORMAL HIGH (ref 65–99)
Potassium: 3.5 mmol/L (ref 3.5–5.1)
SGOT(AST): 291 U/L — ABNORMAL HIGH (ref 15–37)
Sodium: 136 mmol/L (ref 136–145)

## 2012-06-14 LAB — HEPATIC FUNCTION PANEL A (ARMC)
Albumin: 2.9 g/dL — ABNORMAL LOW (ref 3.4–5.0)
Alkaline Phosphatase: 245 U/L — ABNORMAL HIGH (ref 50–136)
Bilirubin, Direct: 3.4 mg/dL — ABNORMAL HIGH (ref 0.00–0.20)
Bilirubin,Total: 4.8 mg/dL — ABNORMAL HIGH (ref 0.2–1.0)
SGOT(AST): 194 U/L — ABNORMAL HIGH (ref 15–37)
SGPT (ALT): 84 U/L — ABNORMAL HIGH
Total Protein: 6.5 g/dL (ref 6.4–8.2)

## 2012-06-14 LAB — LIPID PANEL: HDL Cholesterol: <2 mg/dL — ABNORMAL LOW (ref 40–60)

## 2012-06-14 LAB — HEMOGLOBIN A1C: Hemoglobin A1C: 4.4 % (ref 4.2–6.3)

## 2012-06-17 LAB — COMPREHENSIVE METABOLIC PANEL
Albumin: 2.8 g/dL — ABNORMAL LOW (ref 3.4–5.0)
Anion Gap: 7 (ref 7–16)
Calcium, Total: 8.9 mg/dL (ref 8.5–10.1)
Chloride: 105 mmol/L (ref 98–107)
Co2: 27 mmol/L (ref 21–32)
EGFR (Non-African Amer.): >60
Glucose: 103 mg/dL — ABNORMAL HIGH (ref 65–99)
Osmolality: 276 (ref 275–301)
Potassium: 4.1 mmol/L (ref 3.5–5.1)
Sodium: 139 mmol/L (ref 136–145)

## 2012-06-17 LAB — HEPATIC FUNCTION PANEL A (ARMC)
Bilirubin,Total: 3.6 mg/dL — ABNORMAL HIGH (ref 0.2–1.0)
SGOT(AST): 102 U/L — ABNORMAL HIGH (ref 15–37)
SGPT (ALT): 53 U/L

## 2012-06-17 LAB — AMMONIA: Ammonia, Plasma: 65 mcmol/L — ABNORMAL HIGH (ref 11–32)

## 2012-06-18 LAB — URINALYSIS, COMPLETE
Bacteria: NONE SEEN
Blood: NEGATIVE
Leukocyte Esterase: NEGATIVE
Nitrite: NEGATIVE
Ph: 5 (ref 4.5–8.0)
Protein: 30
RBC,UR: <1 /HPF (ref 0–5)

## 2012-06-19 LAB — AMMONIA: Ammonia, Plasma: 33 mcmol/L — ABNORMAL HIGH (ref 11–32)

## 2012-06-22 LAB — COMPREHENSIVE METABOLIC PANEL
Albumin: 2.9 g/dL — ABNORMAL LOW (ref 3.4–5.0)
Anion Gap: 8 (ref 7–16)
BUN: 6 mg/dL — ABNORMAL LOW (ref 7–18)
Bilirubin,Total: 1.4 mg/dL — ABNORMAL HIGH (ref 0.2–1.0)
Calcium, Total: 8.8 mg/dL (ref 8.5–10.1)
Co2: 27 mmol/L (ref 21–32)
EGFR (African American): >60
Osmolality: 278 (ref 275–301)
Potassium: 4.1 mmol/L (ref 3.5–5.1)
Sodium: 140 mmol/L (ref 136–145)

## 2012-06-22 LAB — CBC WITH DIFFERENTIAL/PLATELET
Basophil #: 0.2 10*3/uL — ABNORMAL HIGH (ref 0.0–0.1)
Basophil %: 3 %
Eosinophil #: 0.1 10*3/uL (ref 0.0–0.7)
Eosinophil %: 2.1 %
HCT: 36.4 % — ABNORMAL LOW (ref 40.0–52.0)
HGB: 12.9 g/dL — ABNORMAL LOW (ref 13.0–18.0)
Lymphocyte #: 1.3 10*3/uL (ref 1.0–3.6)
MCH: 37 pg — ABNORMAL HIGH (ref 26.0–34.0)
MCV: 104 fL — ABNORMAL HIGH (ref 80–100)
Monocyte #: 1.1 x10 3/mm — ABNORMAL HIGH (ref 0.2–1.0)
Neutrophil #: 2.6 10*3/uL (ref 1.4–6.5)
Neutrophil %: 49.6 %
RBC: 3.5 10*6/uL — ABNORMAL LOW (ref 4.40–5.90)
RDW: 14.1 % (ref 11.5–14.5)

## 2012-07-06 IMAGING — CR DG CHEST 2V
1 series · 2 of 2 positions shown · non-contrast
Comparison: none

REASON FOR EXAM: pleuritic cp
COMMENTS:

PROCEDURE:     DXR - DXR CHEST PA (OR AP) AND LATERAL  - June 01, 2010  [DATE]
RESULT:     Comparison: 11/07/2006

[Series 1: view not recorded · 0.17mm/px · 2 of 2 slices shown]
[im 1/2]
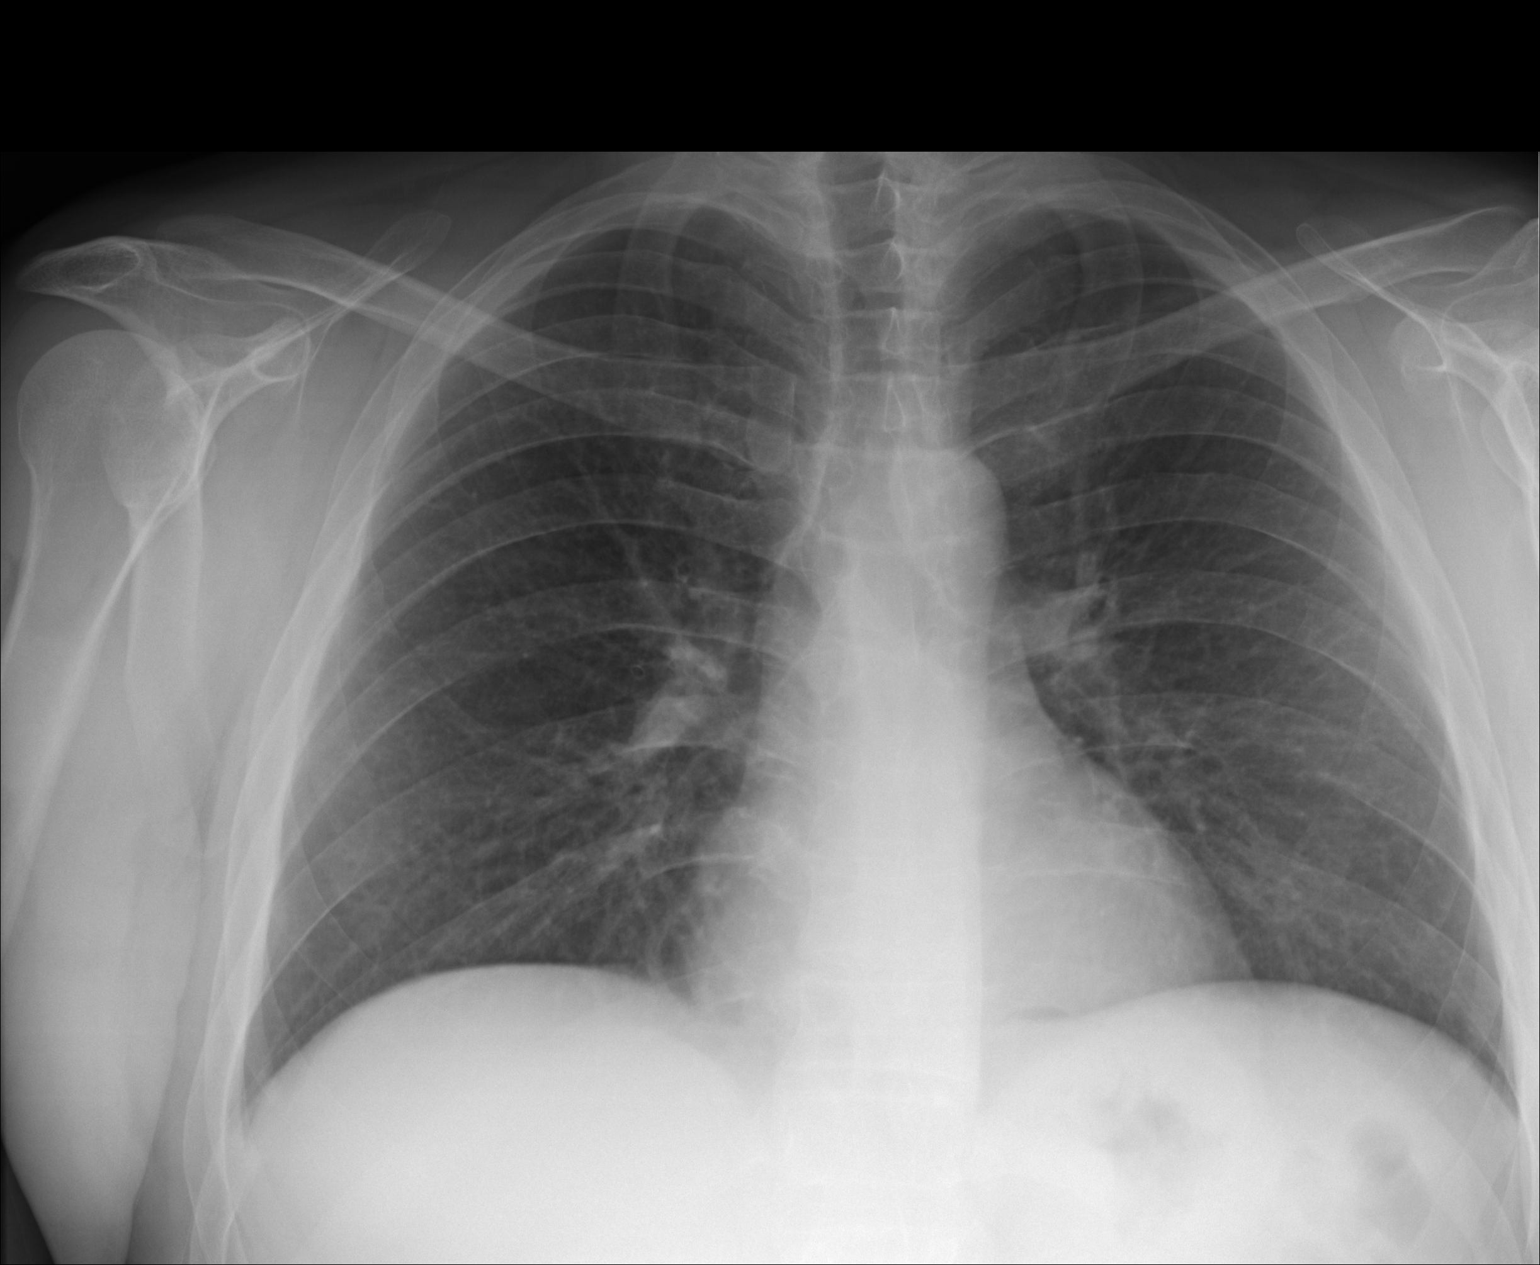
[im 2/2]
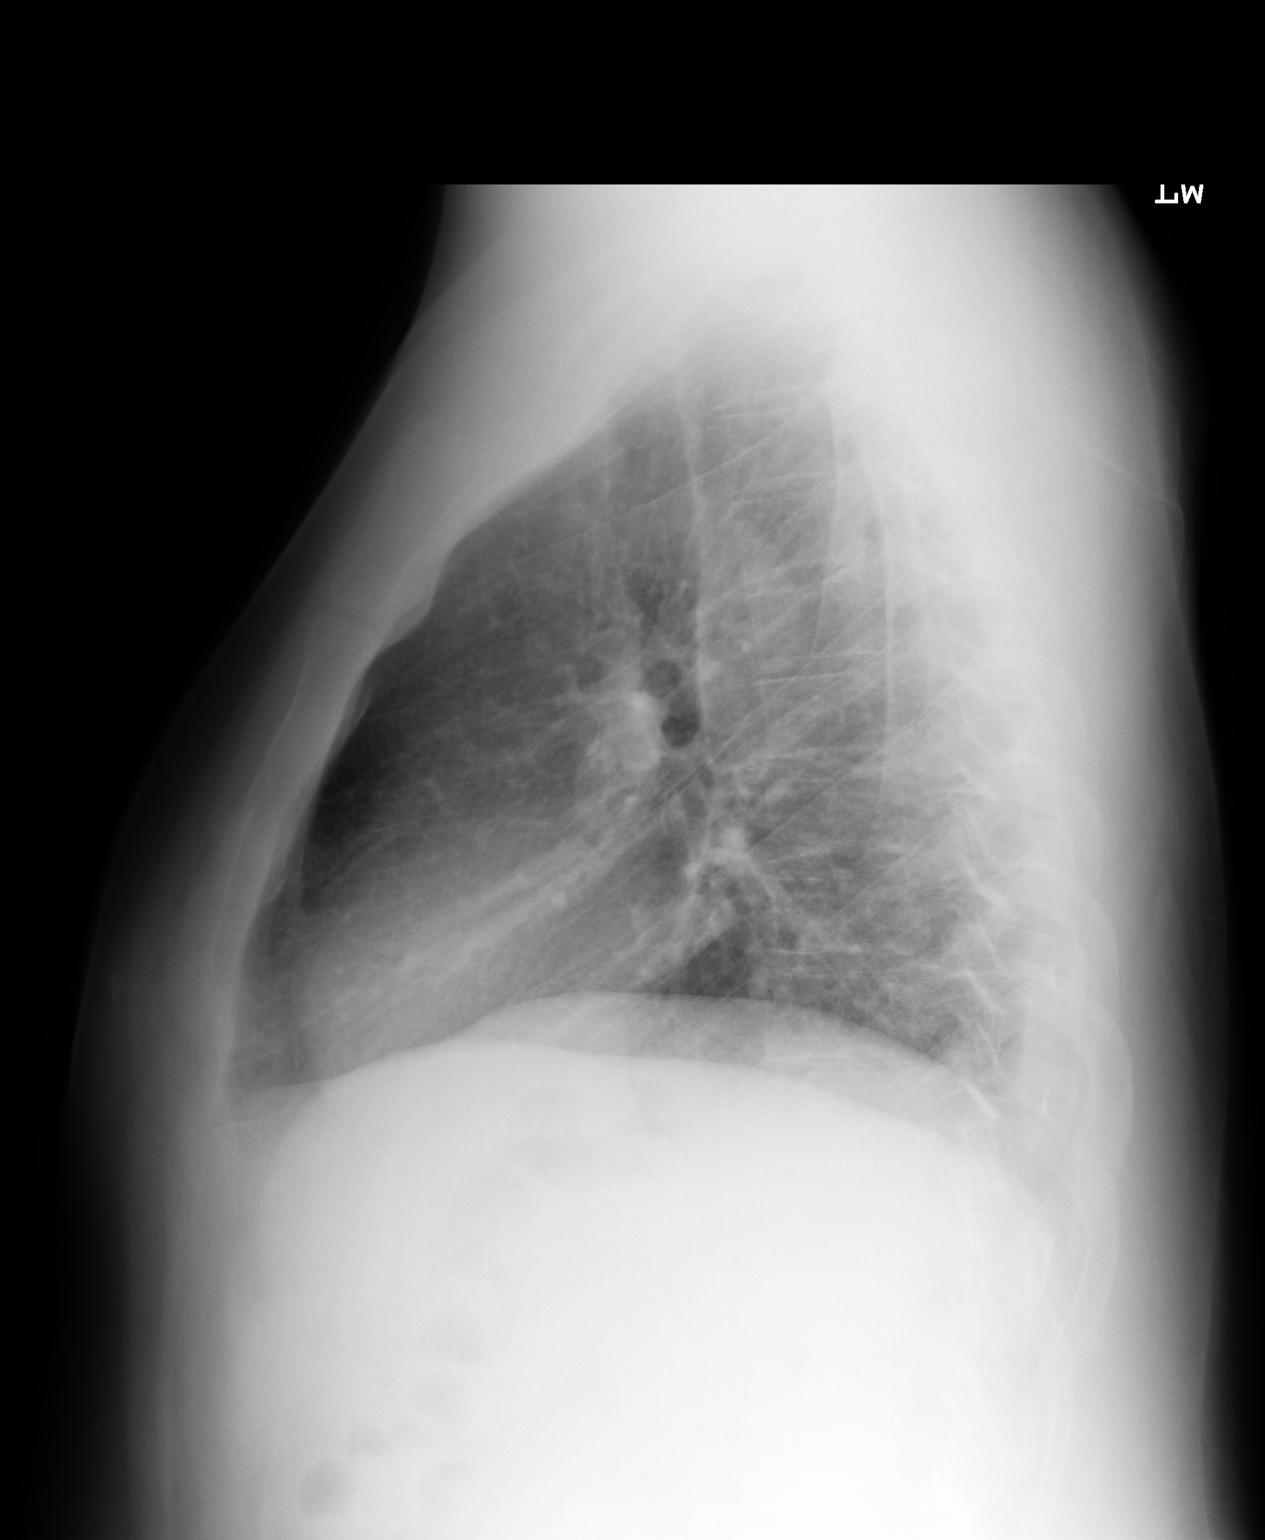

[2 of 2 positions shown; findings below may reference images not displayed]

FINDINGS: Heart and mediastinum are stable. Lungs are clear.
IMPRESSION: No acute cardiopulmonary disease.

## 2012-07-09 LAB — CBC
HCT: 35.4 % — ABNORMAL LOW (ref 40.0–52.0)
MCHC: 35.3 g/dL (ref 32.0–36.0)
MCV: 99 fL (ref 80–100)
Platelet: 211 10*3/uL (ref 150–440)
RDW: 13.8 % (ref 11.5–14.5)
WBC: 6.3 10*3/uL (ref 3.8–10.6)

## 2012-07-09 LAB — COMPREHENSIVE METABOLIC PANEL
Alkaline Phosphatase: 97 U/L (ref 50–136)
Anion Gap: 9 (ref 7–16)
Bilirubin,Total: 0.5 mg/dL (ref 0.2–1.0)
Calcium, Total: 8.7 mg/dL (ref 8.5–10.1)
Chloride: 104 mmol/L (ref 98–107)
Co2: 25 mmol/L (ref 21–32)
EGFR (African American): >60
EGFR (Non-African Amer.): >60
Glucose: 150 mg/dL — ABNORMAL HIGH (ref 65–99)
Osmolality: 275 (ref 275–301)
Potassium: 3 mmol/L — ABNORMAL LOW (ref 3.5–5.1)
SGOT(AST): 73 U/L — ABNORMAL HIGH (ref 15–37)
SGPT (ALT): 76 U/L (ref 12–78)
Sodium: 138 mmol/L (ref 136–145)
Total Protein: 7.3 g/dL (ref 6.4–8.2)

## 2012-07-09 LAB — ETHANOL
Ethanol %: 0.125 % — ABNORMAL HIGH (ref 0.000–0.080)
Ethanol: 125 mg/dL

## 2012-07-09 LAB — DRUG SCREEN, URINE
Amphetamines, Ur Screen: NEGATIVE (ref ?–1000)
Barbiturates, Ur Screen: NEGATIVE (ref ?–200)
Cocaine Metabolite,Ur ~~LOC~~: NEGATIVE (ref ?–300)
MDMA (Ecstasy)Ur Screen: NEGATIVE (ref ?–500)

## 2012-07-10 ENCOUNTER — Observation Stay: Payer: Self-pay | Admitting: Student

## 2012-07-10 LAB — URINALYSIS, COMPLETE
Bacteria: NONE SEEN
Leukocyte Esterase: NEGATIVE
Nitrite: NEGATIVE
Ph: 6 (ref 4.5–8.0)
Protein: NEGATIVE
Specific Gravity: 1.001 (ref 1.003–1.030)
WBC UR: NONE SEEN /HPF (ref 0–5)

## 2012-07-16 ENCOUNTER — Emergency Department: Payer: Self-pay | Admitting: Emergency Medicine

## 2012-07-16 IMAGING — CR DG CHEST 1V PORT
1 series · 1 of 1 positions shown · non-contrast
Comparison: none

REASON FOR EXAM: cp
COMMENTS:

PROCEDURE:     DXR - DXR PORTABLE CHEST SINGLE VIEW  - June 11, 2010  [DATE]
RESULT:     Comparison is made to a prior exam of 06/01/2010.
The lung fields are clear of infiltrate. The heart, mediastinal and osseous
structures show no significant abnormalities.

[view not recorded]
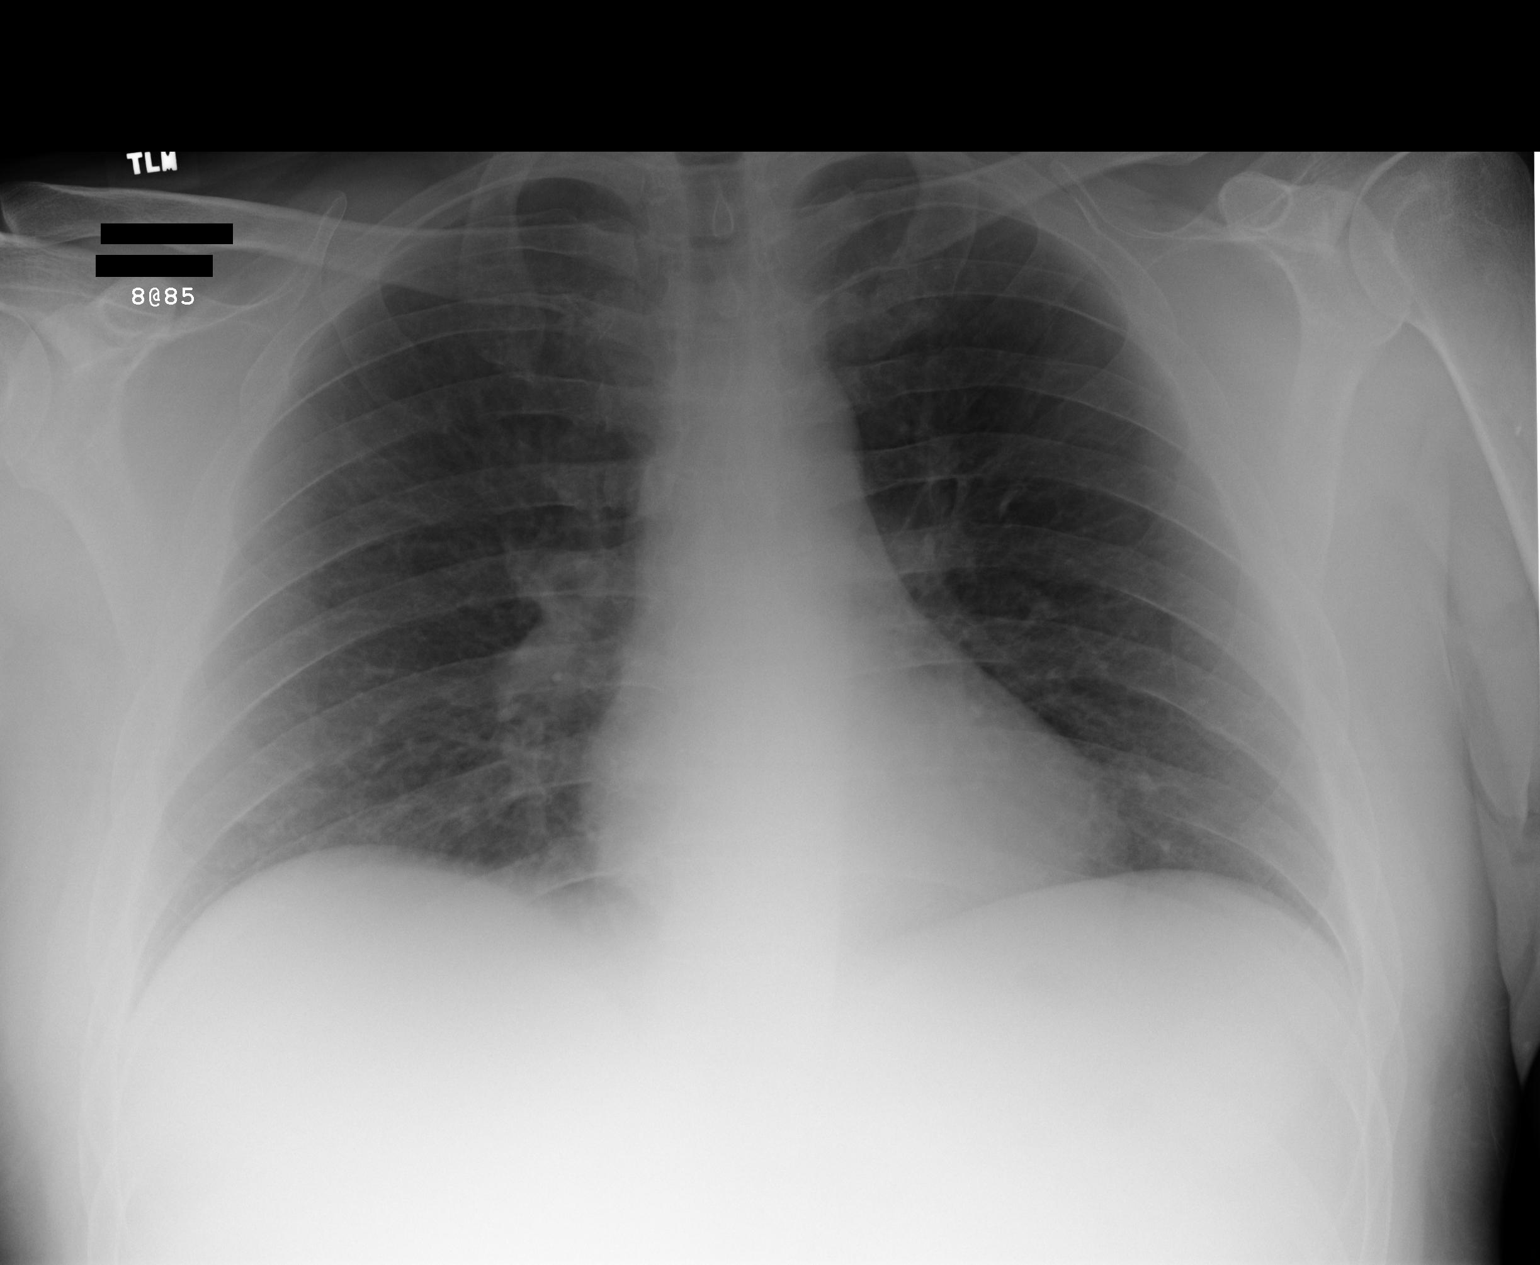

[1 of 1 positions shown; findings below may reference images not displayed]

IMPRESSION: No acute changes are identified.

## 2012-07-17 LAB — COMPREHENSIVE METABOLIC PANEL
Alkaline Phosphatase: 99 U/L (ref 50–136)
BUN: 3 mg/dL — ABNORMAL LOW (ref 7–18)
Calcium, Total: 8.8 mg/dL (ref 8.5–10.1)
Chloride: 111 mmol/L — ABNORMAL HIGH (ref 98–107)
Co2: 18 mmol/L — ABNORMAL LOW (ref 21–32)
Creatinine: 0.76 mg/dL (ref 0.60–1.30)
EGFR (Non-African Amer.): >60
SGOT(AST): 70 U/L — ABNORMAL HIGH (ref 15–37)
SGPT (ALT): 61 U/L (ref 12–78)
Sodium: 143 mmol/L (ref 136–145)
Total Protein: 8.1 g/dL (ref 6.4–8.2)

## 2012-07-17 LAB — ETHANOL
Ethanol %: 0.142 % — ABNORMAL HIGH (ref 0.000–0.080)
Ethanol: 142 mg/dL

## 2012-07-17 LAB — CBC
MCH: 33.5 pg (ref 26.0–34.0)
Platelet: 229 10*3/uL (ref 150–440)
RBC: 4.27 10*6/uL — ABNORMAL LOW (ref 4.40–5.90)
RDW: 13.7 % (ref 11.5–14.5)
WBC: 7.9 10*3/uL (ref 3.8–10.6)

## 2012-07-17 LAB — DRUG SCREEN, URINE
Benzodiazepine, Ur Scrn: POSITIVE (ref ?–200)
MDMA (Ecstasy)Ur Screen: NEGATIVE (ref ?–500)
Methadone, Ur Screen: NEGATIVE (ref ?–300)
Phencyclidine (PCP) Ur S: NEGATIVE (ref ?–25)
Tricyclic, Ur Screen: NEGATIVE (ref ?–1000)

## 2012-07-17 LAB — TSH: Thyroid Stimulating Horm: 2.08 u[IU]/mL

## 2012-07-28 IMAGING — CR RIGHT HAND - COMPLETE 3+ VIEW
1 series · 3 of 3 positions shown · non-contrast
Comparison: none

REASON FOR EXAM: deformity
COMMENTS:

[Series 1: view not recorded · 0.17mm/px · 3 of 3 slices shown]
[im 1/3]
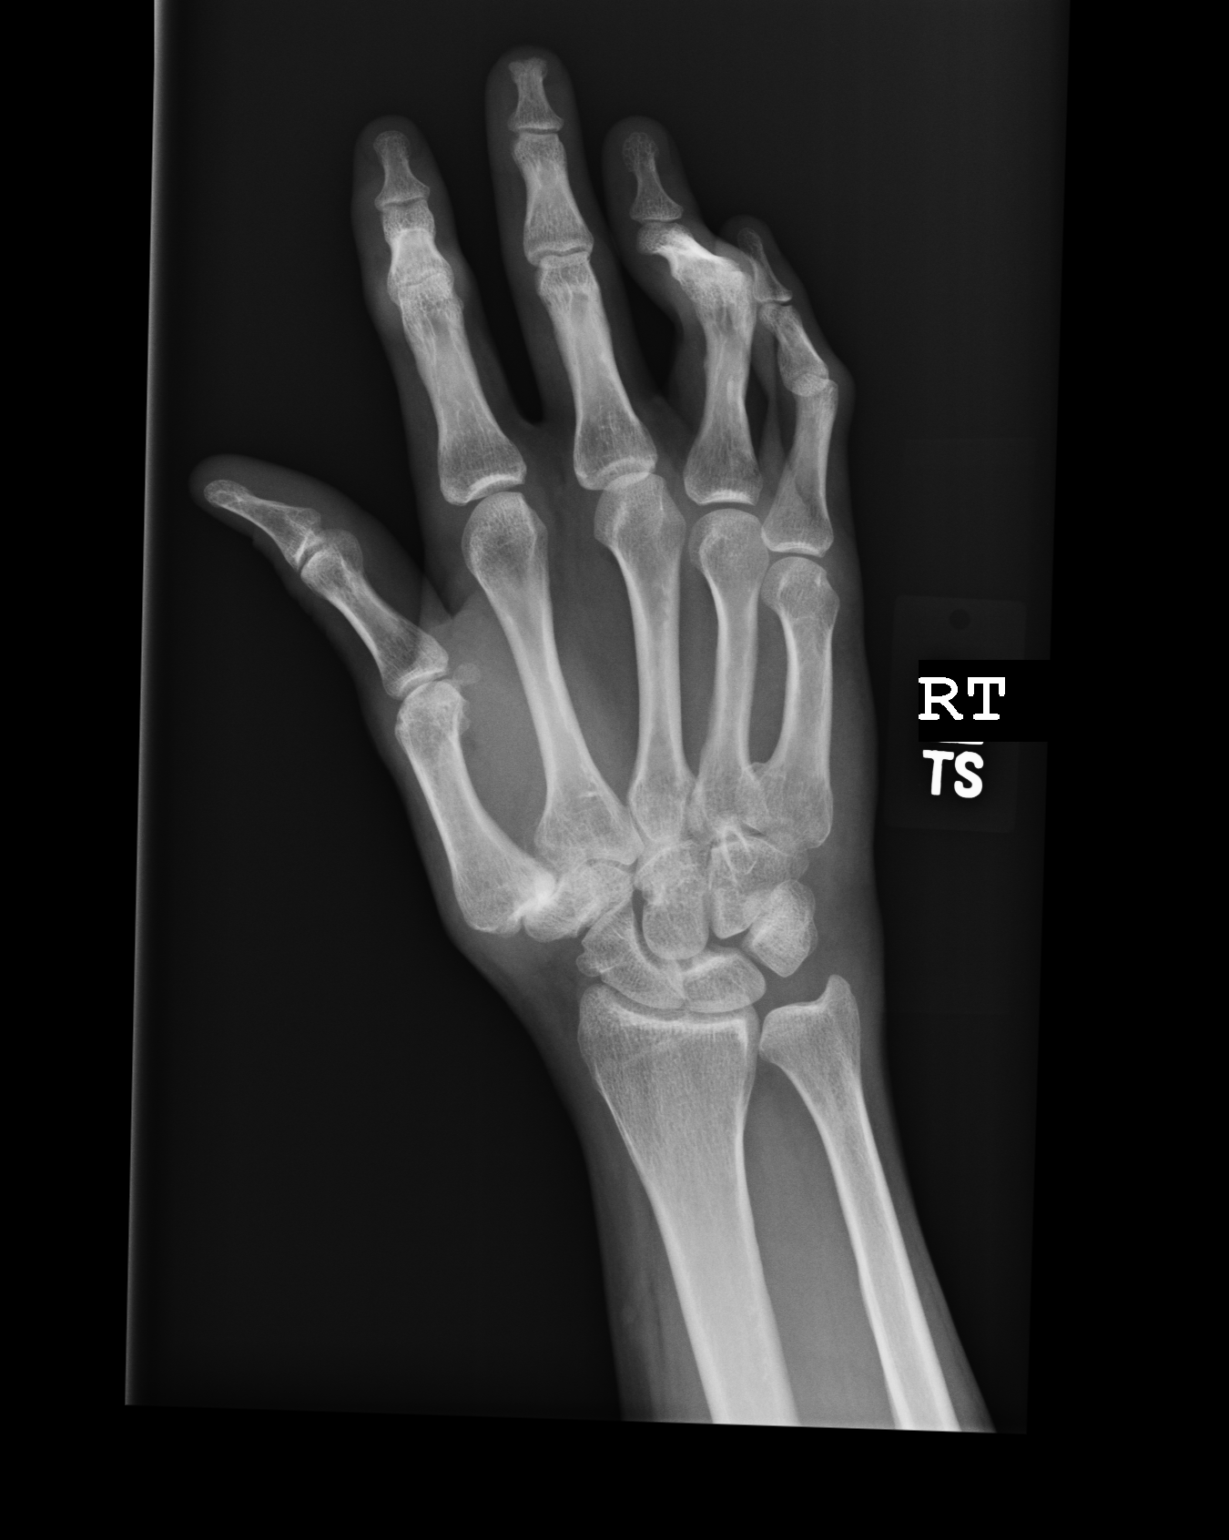
[im 2/3]
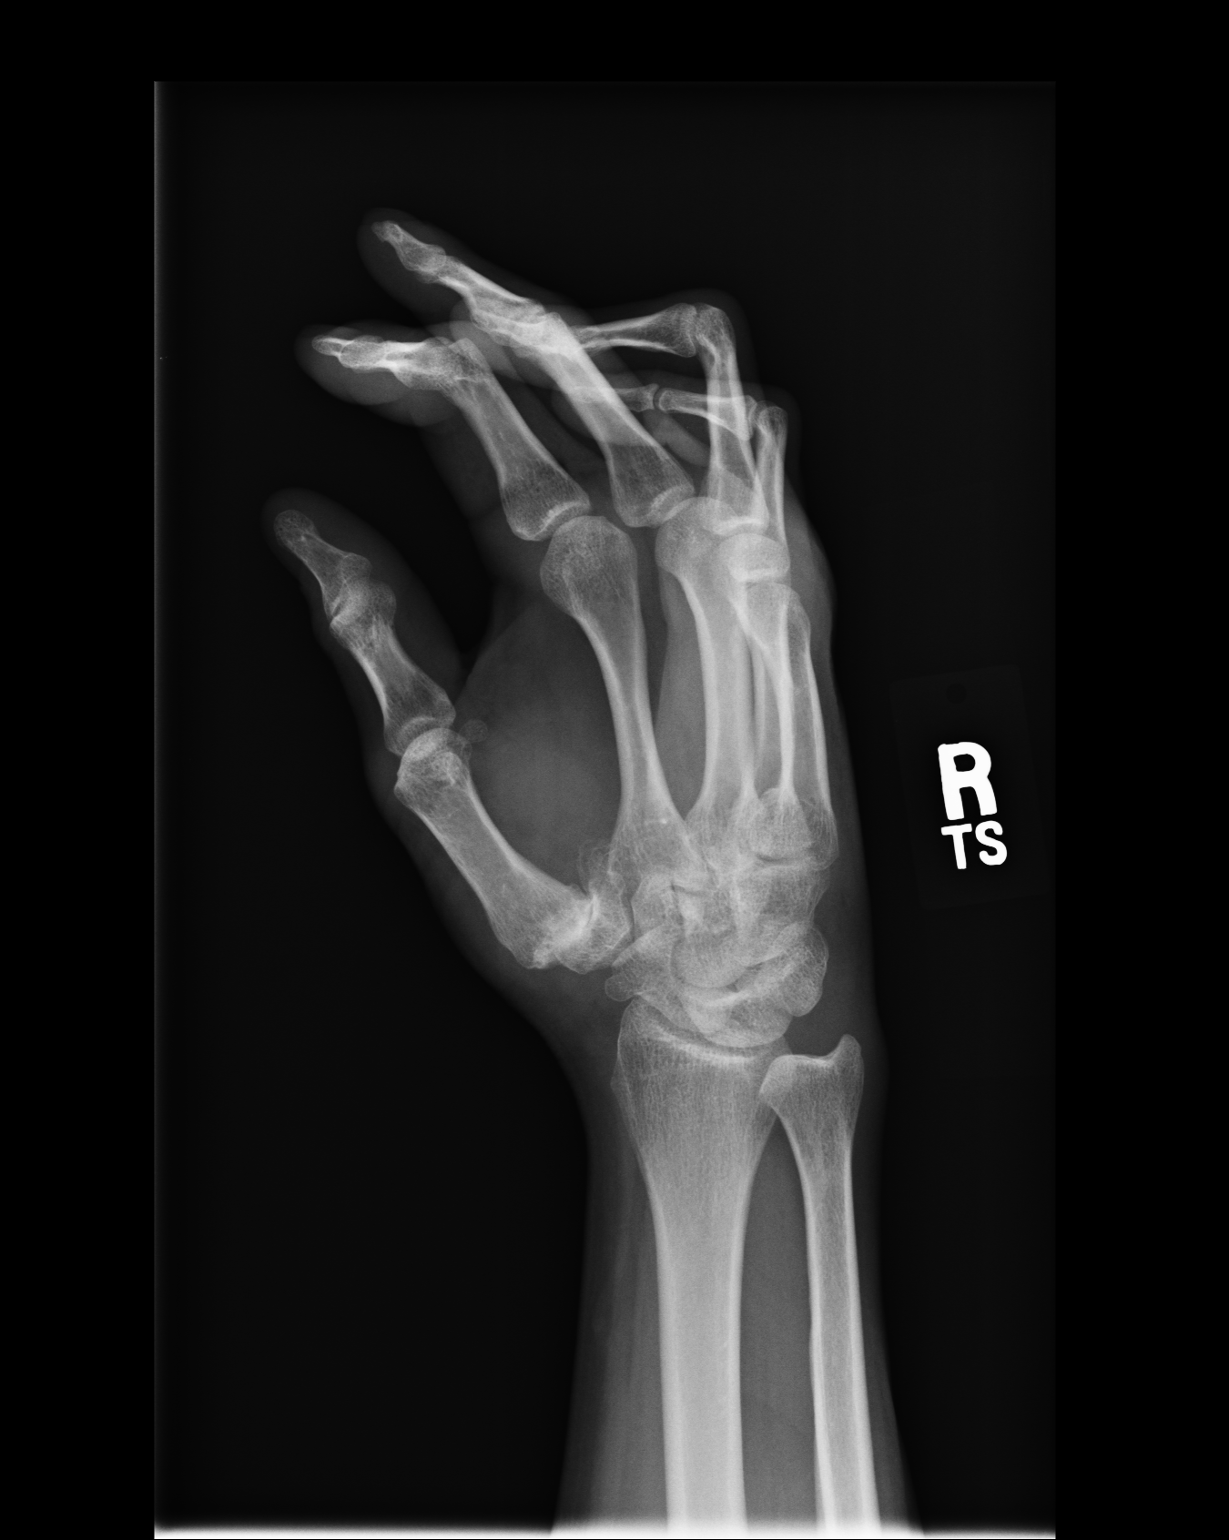
[im 3/3]
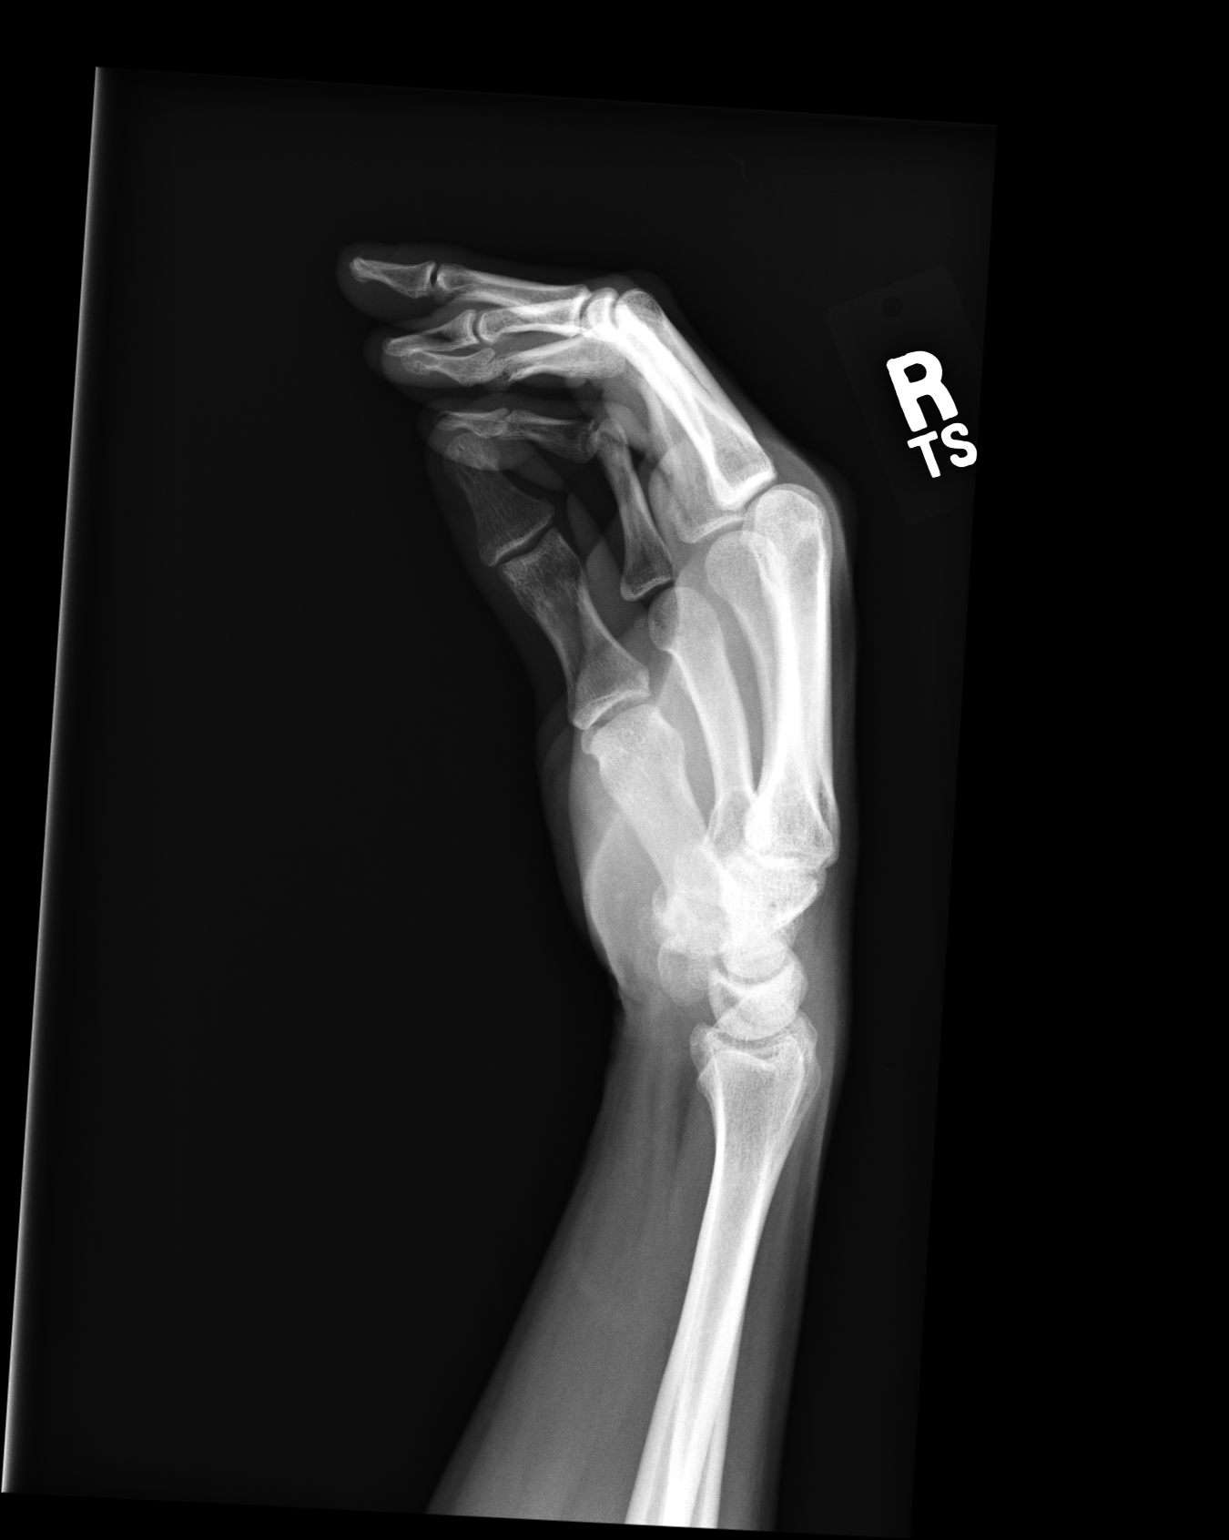

[3 of 3 positions shown; findings below may reference images not displayed]

PROCEDURE:     DXR - DXR HAND RT COMPLETE W/OBLIQUES  - June 23, 2010 [DATE]

RESULT:     Comparison is made to the prior exam of 02/22/2010.

There is observed chronic deformity at the PIP joint of the fourth finger,
unchanged as compared to the prior exam. There is again noted chronic
flexion at the PIP joints of the fourth and fifth fingers. Arthritic change
is again noted at the first carpometacarpal articulation and at the PIP
joint of the index finger.
IMPRESSION: 1.  No fracture or other acute bony abnormality is seen.
2.  Chronic changes are noted as mentioned above.

## 2012-08-10 ENCOUNTER — Inpatient Hospital Stay: Payer: Self-pay | Admitting: Internal Medicine

## 2012-08-10 LAB — COMPREHENSIVE METABOLIC PANEL
Alkaline Phosphatase: 119 U/L (ref 50–136)
Bilirubin,Total: 0.3 mg/dL (ref 0.2–1.0)
Calcium, Total: 8.9 mg/dL (ref 8.5–10.1)
Co2: 24 mmol/L (ref 21–32)
EGFR (African American): >60
EGFR (Non-African Amer.): >60
Osmolality: 269 (ref 275–301)
Potassium: 3.6 mmol/L (ref 3.5–5.1)
SGOT(AST): 34 U/L (ref 15–37)

## 2012-08-10 LAB — URINALYSIS, COMPLETE
Bilirubin,UR: NEGATIVE
Glucose,UR: 50 mg/dL (ref 0–75)
Ketone: NEGATIVE
Nitrite: NEGATIVE
Ph: 5 (ref 4.5–8.0)
Protein: NEGATIVE
Specific Gravity: 1.003 (ref 1.003–1.030)
Squamous Epithelial: NONE SEEN
WBC UR: NONE SEEN /HPF (ref 0–5)

## 2012-08-10 LAB — CBC
HCT: 43.2 % (ref 40.0–52.0)
MCHC: 34.1 g/dL (ref 32.0–36.0)
MCV: 96 fL (ref 80–100)
RBC: 4.51 10*6/uL (ref 4.40–5.90)
WBC: 5.9 10*3/uL (ref 3.8–10.6)

## 2012-08-10 LAB — DRUG SCREEN, URINE
Barbiturates, Ur Screen: NEGATIVE (ref ?–200)
Cocaine Metabolite,Ur ~~LOC~~: NEGATIVE (ref ?–300)
Methadone, Ur Screen: NEGATIVE (ref ?–300)
Opiate, Ur Screen: NEGATIVE (ref ?–300)
Phencyclidine (PCP) Ur S: NEGATIVE (ref ?–25)
Tricyclic, Ur Screen: NEGATIVE (ref ?–1000)

## 2012-08-10 LAB — ACETAMINOPHEN LEVEL: Acetaminophen: 3 ug/mL — ABNORMAL LOW

## 2012-08-10 LAB — TROPONIN I: Troponin-I: <0.02 ng/mL

## 2012-08-10 LAB — CARBAMAZEPINE LEVEL, TOTAL: Carbamazepine: 11.2 ug/mL (ref 4.0–12.0)

## 2012-08-10 LAB — MAGNESIUM: Magnesium: 1.9 mg/dL

## 2012-08-10 LAB — PHOSPHORUS: Phosphorus: 2.1 mg/dL — ABNORMAL LOW (ref 2.5–4.9)

## 2012-08-10 LAB — CK TOTAL AND CKMB (NOT AT ARMC): CK-MB: 0.6 ng/mL (ref 0.5–3.6)

## 2012-08-10 LAB — SALICYLATE LEVEL: Salicylates, Serum: 6.4 mg/dL — ABNORMAL HIGH

## 2012-08-11 LAB — CBC WITH DIFFERENTIAL/PLATELET
Basophil %: 0.5 %
Eosinophil #: 0.3 10*3/uL (ref 0.0–0.7)
Eosinophil %: 4.4 %
HCT: 42 % (ref 40.0–52.0)
HGB: 14.2 g/dL (ref 13.0–18.0)
Lymphocyte %: 23 %
MCH: 32.1 pg (ref 26.0–34.0)
MCHC: 33.8 g/dL (ref 32.0–36.0)
MCV: 95 fL (ref 80–100)
Monocyte #: 0.9 x10 3/mm (ref 0.2–1.0)
Monocyte %: 14.4 %
Neutrophil #: 3.7 10*3/uL (ref 1.4–6.5)
Neutrophil %: 57.7 %
RBC: 4.42 10*6/uL (ref 4.40–5.90)

## 2012-08-11 LAB — BASIC METABOLIC PANEL
BUN: 6 mg/dL — ABNORMAL LOW (ref 7–18)
Chloride: 98 mmol/L (ref 98–107)
Co2: 28 mmol/L (ref 21–32)
Creatinine: 0.85 mg/dL (ref 0.60–1.30)
EGFR (Non-African Amer.): >60
Glucose: 128 mg/dL — ABNORMAL HIGH (ref 65–99)
Osmolality: 273 (ref 275–301)
Potassium: 3.9 mmol/L (ref 3.5–5.1)

## 2012-08-11 LAB — HEMOGLOBIN A1C: Hemoglobin A1C: 4.7 % (ref 4.2–6.3)

## 2012-08-13 ENCOUNTER — Inpatient Hospital Stay: Payer: Self-pay | Admitting: Psychiatry

## 2012-11-24 ENCOUNTER — Emergency Department: Payer: Self-pay | Admitting: Emergency Medicine

## 2012-11-24 LAB — CBC
HCT: 46.8 % (ref 40.0–52.0)
HGB: 15.7 g/dL (ref 13.0–18.0)
MCH: 32.4 pg (ref 26.0–34.0)
MCHC: 33.5 g/dL (ref 32.0–36.0)
MCV: 97 fL (ref 80–100)
RDW: 17.3 % — ABNORMAL HIGH (ref 11.5–14.5)
WBC: 5.6 10*3/uL (ref 3.8–10.6)

## 2012-11-24 LAB — DRUG SCREEN, URINE
Benzodiazepine, Ur Scrn: NEGATIVE (ref ?–200)
Cocaine Metabolite,Ur ~~LOC~~: NEGATIVE (ref ?–300)
Opiate, Ur Screen: NEGATIVE (ref ?–300)
Phencyclidine (PCP) Ur S: NEGATIVE (ref ?–25)

## 2012-11-24 LAB — CARBAMAZEPINE LEVEL, TOTAL: Carbamazepine: <0.5 ug/mL — ABNORMAL LOW (ref 4.0–12.0)

## 2012-11-24 LAB — URINALYSIS, COMPLETE
Bacteria: NONE SEEN
Bilirubin,UR: NEGATIVE
Glucose,UR: NEGATIVE mg/dL (ref 0–75)
Leukocyte Esterase: NEGATIVE
Nitrite: NEGATIVE
Protein: NEGATIVE
RBC,UR: NONE SEEN /HPF (ref 0–5)
Specific Gravity: 1.003 (ref 1.003–1.030)

## 2012-11-24 LAB — COMPREHENSIVE METABOLIC PANEL
Alkaline Phosphatase: 86 U/L (ref 50–136)
Anion Gap: 8 (ref 7–16)
Calcium, Total: 8.5 mg/dL (ref 8.5–10.1)
Chloride: 110 mmol/L — ABNORMAL HIGH (ref 98–107)
Co2: 26 mmol/L (ref 21–32)
EGFR (Non-African Amer.): >60
Glucose: 82 mg/dL (ref 65–99)
Potassium: 4.3 mmol/L (ref 3.5–5.1)
Sodium: 144 mmol/L (ref 136–145)
Total Protein: 7.8 g/dL (ref 6.4–8.2)

## 2012-11-24 LAB — ETHANOL: Ethanol %: 0.21 % — ABNORMAL HIGH (ref 0.000–0.080)

## 2012-11-26 LAB — URINALYSIS, COMPLETE
Blood: NEGATIVE
Glucose,UR: 50 mg/dL (ref 0–75)
Ketone: NEGATIVE
Nitrite: NEGATIVE
Ph: 5 (ref 4.5–8.0)
Specific Gravity: 1.005 (ref 1.003–1.030)
Squamous Epithelial: NONE SEEN
WBC UR: NONE SEEN /HPF (ref 0–5)

## 2012-11-26 LAB — COMPREHENSIVE METABOLIC PANEL
Albumin: 3.5 g/dL (ref 3.4–5.0)
Alkaline Phosphatase: 76 U/L (ref 50–136)
BUN: 2 mg/dL — ABNORMAL LOW (ref 7–18)
Bilirubin,Total: 0.3 mg/dL (ref 0.2–1.0)
Co2: 19 mmol/L — ABNORMAL LOW (ref 21–32)
Creatinine: 0.75 mg/dL (ref 0.60–1.30)
Osmolality: 272 (ref 275–301)
SGOT(AST): 49 U/L — ABNORMAL HIGH (ref 15–37)
Sodium: 139 mmol/L (ref 136–145)

## 2012-11-26 LAB — DRUG SCREEN, URINE
Barbiturates, Ur Screen: NEGATIVE (ref ?–200)
Benzodiazepine, Ur Scrn: POSITIVE (ref ?–200)
Cannabinoid 50 Ng, Ur ~~LOC~~: NEGATIVE (ref ?–50)
Methadone, Ur Screen: NEGATIVE (ref ?–300)
Phencyclidine (PCP) Ur S: NEGATIVE (ref ?–25)
Tricyclic, Ur Screen: NEGATIVE (ref ?–1000)

## 2012-11-26 LAB — CBC
HCT: 42.2 % (ref 40.0–52.0)
HGB: 14.3 g/dL (ref 13.0–18.0)
MCHC: 33.8 g/dL (ref 32.0–36.0)
RBC: 4.3 10*6/uL — ABNORMAL LOW (ref 4.40–5.90)
RDW: 16.6 % — ABNORMAL HIGH (ref 11.5–14.5)

## 2012-11-26 LAB — TSH: Thyroid Stimulating Horm: 2.09 u[IU]/mL

## 2012-11-26 LAB — CK TOTAL AND CKMB (NOT AT ARMC)
CK, Total: 140 U/L (ref 35–232)
CK-MB: 1.4 ng/mL (ref 0.5–3.6)

## 2012-11-26 LAB — ACETAMINOPHEN LEVEL: Acetaminophen: 39 ug/mL — ABNORMAL HIGH

## 2012-11-26 LAB — ETHANOL: Ethanol %: 0.159 % — ABNORMAL HIGH (ref 0.000–0.080)

## 2012-11-26 LAB — TROPONIN I: Troponin-I: <0.02 ng/mL

## 2012-11-27 ENCOUNTER — Inpatient Hospital Stay: Payer: Self-pay | Admitting: Internal Medicine

## 2012-11-28 LAB — CBC WITH DIFFERENTIAL/PLATELET
Basophil #: 0 10*3/uL (ref 0.0–0.1)
Basophil %: 1 %
Eosinophil #: 0.1 10*3/uL (ref 0.0–0.7)
Eosinophil %: 3.4 %
Lymphocyte #: 0.9 10*3/uL — ABNORMAL LOW (ref 1.0–3.6)
Lymphocyte %: 26.6 %
MCV: 98 fL (ref 80–100)
Monocyte #: 0.4 x10 3/mm (ref 0.2–1.0)
Monocyte %: 11.2 %
Platelet: 121 10*3/uL — ABNORMAL LOW (ref 150–440)
RBC: 4.27 10*6/uL — ABNORMAL LOW (ref 4.40–5.90)
RDW: 16.4 % — ABNORMAL HIGH (ref 11.5–14.5)
WBC: 3.5 10*3/uL — ABNORMAL LOW (ref 3.8–10.6)

## 2012-11-28 LAB — BASIC METABOLIC PANEL
Anion Gap: 9 (ref 7–16)
BUN: 2 mg/dL — ABNORMAL LOW (ref 7–18)
Calcium, Total: 8.6 mg/dL (ref 8.5–10.1)
Chloride: 103 mmol/L (ref 98–107)
Co2: 25 mmol/L (ref 21–32)
EGFR (African American): >60
Osmolality: 275 (ref 275–301)
Potassium: 3.5 mmol/L (ref 3.5–5.1)
Sodium: 137 mmol/L (ref 136–145)

## 2012-11-29 ENCOUNTER — Inpatient Hospital Stay: Payer: Self-pay | Admitting: Psychiatry

## 2012-12-02 LAB — DRUG SCREEN, URINE
Amphetamines, Ur Screen: NEGATIVE (ref ?–1000)
Barbiturates, Ur Screen: NEGATIVE (ref ?–200)
Benzodiazepine, Ur Scrn: NEGATIVE (ref ?–200)
Cannabinoid 50 Ng, Ur ~~LOC~~: NEGATIVE (ref ?–50)
Cocaine Metabolite,Ur ~~LOC~~: NEGATIVE (ref ?–300)
MDMA (Ecstasy)Ur Screen: NEGATIVE (ref ?–500)
Opiate, Ur Screen: NEGATIVE (ref ?–300)
Tricyclic, Ur Screen: NEGATIVE (ref ?–1000)

## 2012-12-02 LAB — COMPREHENSIVE METABOLIC PANEL
Albumin: 3.7 g/dL (ref 3.4–5.0)
Alkaline Phosphatase: 98 U/L (ref 50–136)
Anion Gap: 11 (ref 7–16)
BUN: 5 mg/dL — ABNORMAL LOW (ref 7–18)
Bilirubin,Total: 0.2 mg/dL (ref 0.2–1.0)
Calcium, Total: 8.4 mg/dL — ABNORMAL LOW (ref 8.5–10.1)
Chloride: 108 mmol/L — ABNORMAL HIGH (ref 98–107)
Co2: 21 mmol/L (ref 21–32)
Creatinine: 0.77 mg/dL (ref 0.60–1.30)
EGFR (African American): >60
EGFR (Non-African Amer.): >60
Glucose: 66 mg/dL (ref 65–99)
Osmolality: 275 (ref 275–301)
Potassium: 3.2 mmol/L — ABNORMAL LOW (ref 3.5–5.1)
SGPT (ALT): 61 U/L (ref 12–78)
Sodium: 140 mmol/L (ref 136–145)

## 2012-12-02 LAB — URINALYSIS, COMPLETE
Bilirubin,UR: NEGATIVE
Blood: NEGATIVE
Leukocyte Esterase: NEGATIVE
Nitrite: NEGATIVE
Protein: NEGATIVE
RBC,UR: NONE SEEN /HPF (ref 0–5)
Specific Gravity: 1.001 (ref 1.003–1.030)
Squamous Epithelial: NONE SEEN
WBC UR: NONE SEEN /HPF (ref 0–5)

## 2012-12-02 LAB — ETHANOL
Ethanol %: 0.077 % (ref 0.000–0.080)
Ethanol: 77 mg/dL

## 2012-12-02 LAB — CBC WITH DIFFERENTIAL/PLATELET
Basophil #: 0 10*3/uL (ref 0.0–0.1)
Basophil %: 0.6 %
Eosinophil #: 0.2 10*3/uL (ref 0.0–0.7)
Lymphocyte #: 1.4 10*3/uL (ref 1.0–3.6)
MCHC: 34.5 g/dL (ref 32.0–36.0)
MCV: 97 fL (ref 80–100)
Monocyte %: 7.8 %
Neutrophil #: 2.7 10*3/uL (ref 1.4–6.5)
Neutrophil %: 57.8 %
Platelet: 205 10*3/uL (ref 150–440)
WBC: 4.7 10*3/uL (ref 3.8–10.6)

## 2012-12-02 LAB — ACETAMINOPHEN LEVEL: Acetaminophen: <2 ug/mL

## 2012-12-02 LAB — LIPASE, BLOOD: Lipase: 218 U/L (ref 73–393)

## 2012-12-03 ENCOUNTER — Inpatient Hospital Stay: Payer: Self-pay | Admitting: Internal Medicine

## 2012-12-05 LAB — HEPATIC FUNCTION PANEL A (ARMC)
Albumin: 3.6 g/dL (ref 3.4–5.0)
Alkaline Phosphatase: 77 U/L (ref 50–136)
Bilirubin, Direct: <0.1 mg/dL (ref 0.00–0.20)
SGOT(AST): 33 U/L (ref 15–37)
SGPT (ALT): 37 U/L (ref 12–78)

## 2012-12-05 LAB — BASIC METABOLIC PANEL
Anion Gap: 7 (ref 7–16)
BUN: 7 mg/dL (ref 7–18)
Creatinine: 0.69 mg/dL (ref 0.60–1.30)
EGFR (African American): >60
Glucose: 176 mg/dL — ABNORMAL HIGH (ref 65–99)
Osmolality: 272 (ref 275–301)
Potassium: 4 mmol/L (ref 3.5–5.1)

## 2012-12-07 ENCOUNTER — Inpatient Hospital Stay: Payer: Self-pay | Admitting: Psychiatry

## 2012-12-08 LAB — STOOL CULTURE

## 2012-12-15 LAB — CBC WITH DIFFERENTIAL/PLATELET
Basophil #: 0.1 10*3/uL (ref 0.0–0.1)
Eosinophil %: 4.2 %
Lymphocyte %: 26 %
MCH: 32.6 pg (ref 26.0–34.0)
MCHC: 33.9 g/dL (ref 32.0–36.0)
Monocyte #: 0.5 x10 3/mm (ref 0.2–1.0)
Neutrophil #: 6.5 10*3/uL (ref 1.4–6.5)
WBC: 10.1 10*3/uL (ref 3.8–10.6)

## 2012-12-15 LAB — URINALYSIS, COMPLETE
Bacteria: NONE SEEN
Glucose,UR: 50 mg/dL (ref 0–75)
Nitrite: NEGATIVE
Ph: 6 (ref 4.5–8.0)
Protein: NEGATIVE
RBC,UR: NONE SEEN /HPF (ref 0–5)
Specific Gravity: 1.002 (ref 1.003–1.030)
Squamous Epithelial: NONE SEEN
WBC UR: <1 /HPF (ref 0–5)

## 2012-12-15 LAB — DRUG SCREEN, URINE
Amphetamines, Ur Screen: NEGATIVE (ref ?–1000)
Barbiturates, Ur Screen: NEGATIVE (ref ?–200)
Benzodiazepine, Ur Scrn: NEGATIVE (ref ?–200)
Cocaine Metabolite,Ur ~~LOC~~: NEGATIVE (ref ?–300)
MDMA (Ecstasy)Ur Screen: NEGATIVE (ref ?–500)
Tricyclic, Ur Screen: NEGATIVE (ref ?–1000)

## 2012-12-15 LAB — COMPREHENSIVE METABOLIC PANEL
Albumin: 3.6 g/dL (ref 3.4–5.0)
Alkaline Phosphatase: 81 U/L (ref 50–136)
BUN: 3 mg/dL — ABNORMAL LOW (ref 7–18)
Chloride: 101 mmol/L (ref 98–107)
Sodium: 133 mmol/L — ABNORMAL LOW (ref 136–145)
Total Protein: 7.8 g/dL (ref 6.4–8.2)

## 2012-12-15 LAB — ACETAMINOPHEN LEVEL: Acetaminophen: <2 ug/mL

## 2012-12-15 LAB — SALICYLATE LEVEL: Salicylates, Serum: <1.7 mg/dL

## 2012-12-15 LAB — ETHANOL: Ethanol %: 0.153 % — ABNORMAL HIGH (ref 0.000–0.080)

## 2012-12-16 ENCOUNTER — Inpatient Hospital Stay: Payer: Self-pay | Admitting: Internal Medicine

## 2012-12-16 LAB — BASIC METABOLIC PANEL
Calcium, Total: 9.5 mg/dL (ref 8.5–10.1)
Chloride: 104 mmol/L (ref 98–107)
Glucose: 46 mg/dL — ABNORMAL LOW (ref 65–99)
Potassium: 3.8 mmol/L (ref 3.5–5.1)
Sodium: 139 mmol/L (ref 136–145)

## 2012-12-17 ENCOUNTER — Inpatient Hospital Stay: Payer: Self-pay | Admitting: Psychiatry

## 2013-01-11 ENCOUNTER — Inpatient Hospital Stay: Payer: Self-pay | Admitting: Internal Medicine

## 2013-01-11 LAB — BASIC METABOLIC PANEL
Chloride: 97 mmol/L — ABNORMAL LOW (ref 98–107)
Creatinine: 0.65 mg/dL (ref 0.60–1.30)
EGFR (Non-African Amer.): >60
Glucose: 54 mg/dL — ABNORMAL LOW (ref 65–99)
Osmolality: 255 (ref 275–301)
Sodium: 130 mmol/L — ABNORMAL LOW (ref 136–145)

## 2013-01-11 LAB — CBC
HCT: 38.7 % — ABNORMAL LOW (ref 40.0–52.0)
MCH: 31.9 pg (ref 26.0–34.0)
Platelet: 309 10*3/uL (ref 150–440)
RDW: 13.6 % (ref 11.5–14.5)

## 2013-01-11 LAB — URINALYSIS, COMPLETE
Bacteria: NONE SEEN
Bilirubin,UR: NEGATIVE
Blood: NEGATIVE
Ketone: NEGATIVE
Leukocyte Esterase: NEGATIVE
Ph: 6 (ref 4.5–8.0)
RBC,UR: <1 /HPF (ref 0–5)
Specific Gravity: 1.003 (ref 1.003–1.030)

## 2013-01-11 LAB — DRUG SCREEN, URINE
Amphetamines, Ur Screen: NEGATIVE
Barbiturates, Ur Screen: NEGATIVE
Benzodiazepine, Ur Scrn: NEGATIVE
Cannabinoid 50 Ng, Ur ~~LOC~~: NEGATIVE
Cocaine Metabolite,Ur ~~LOC~~: NEGATIVE
MDMA (Ecstasy)Ur Screen: NEGATIVE
Methadone, Ur Screen: NEGATIVE
Opiate, Ur Screen: NEGATIVE
Phencyclidine (PCP) Ur S: NEGATIVE
Tricyclic, Ur Screen: NEGATIVE

## 2013-01-11 LAB — COMPREHENSIVE METABOLIC PANEL
Albumin: 3.9 g/dL (ref 3.4–5.0)
Alkaline Phosphatase: 71 U/L (ref 50–136)
Anion Gap: 12 (ref 7–16)
Co2: 21 mmol/L (ref 21–32)
Creatinine: 0.6 mg/dL (ref 0.60–1.30)
EGFR (Non-African Amer.): >60
Potassium: 2.8 mmol/L — ABNORMAL LOW (ref 3.5–5.1)
SGOT(AST): 36 U/L (ref 15–37)
SGPT (ALT): 36 U/L (ref 12–78)
Total Protein: 7.1 g/dL (ref 6.4–8.2)

## 2013-01-11 LAB — TSH: Thyroid Stimulating Horm: 2.63 u[IU]/mL

## 2013-01-11 LAB — ACETAMINOPHEN LEVEL: Acetaminophen: <2 ug/mL

## 2013-01-11 LAB — ETHANOL: Ethanol: 135 mg/dL

## 2013-01-12 DIAGNOSIS — Z01810 Encounter for preprocedural cardiovascular examination: Secondary | ICD-10-CM

## 2013-01-12 LAB — BASIC METABOLIC PANEL
Creatinine: 0.68 mg/dL (ref 0.60–1.30)
EGFR (African American): >60
Glucose: 42 mg/dL — ABNORMAL LOW (ref 65–99)
Osmolality: 256 (ref 275–301)
Potassium: 3.9 mmol/L (ref 3.5–5.1)
Sodium: 131 mmol/L — ABNORMAL LOW (ref 136–145)

## 2013-01-12 LAB — CBC WITH DIFFERENTIAL/PLATELET
Basophil %: 0.2 %
Eosinophil #: 0 10*3/uL (ref 0.0–0.7)
HGB: 13.4 g/dL (ref 13.0–18.0)
Lymphocyte %: 6 %
MCH: 32 pg (ref 26.0–34.0)
MCHC: 34.9 g/dL (ref 32.0–36.0)
Monocyte #: 1.3 x10 3/mm — ABNORMAL HIGH (ref 0.2–1.0)
Monocyte %: 8.2 %
Neutrophil %: 85.4 %
Platelet: 281 10*3/uL (ref 150–440)
RBC: 4.2 10*6/uL — ABNORMAL LOW (ref 4.40–5.90)
RDW: 13.4 % (ref 11.5–14.5)

## 2013-01-12 LAB — HEPATIC FUNCTION PANEL A (ARMC)
Albumin: 3.9 g/dL (ref 3.4–5.0)
Alkaline Phosphatase: 81 U/L (ref 50–136)
Bilirubin, Direct: 0.2 mg/dL (ref 0.00–0.20)
Bilirubin,Total: 0.6 mg/dL (ref 0.2–1.0)
SGOT(AST): 76 U/L — ABNORMAL HIGH (ref 15–37)
SGPT (ALT): 71 U/L (ref 12–78)
Total Protein: 7.4 g/dL (ref 6.4–8.2)

## 2013-01-12 LAB — CARBAMAZEPINE LEVEL, TOTAL: Carbamazepine: 17.6 ug/mL (ref 4.0–12.0)

## 2013-01-12 LAB — HEMOGLOBIN A1C: Hemoglobin A1C: 5.6 % (ref 4.2–6.3)

## 2013-01-13 LAB — CBC WITH DIFFERENTIAL/PLATELET
Eosinophil %: 8.5 %
Lymphocyte %: 25.4 %
Neutrophil #: 2.3 10*3/uL (ref 1.4–6.5)
Neutrophil %: 54.9 %
RBC: 4.08 10*6/uL — ABNORMAL LOW (ref 4.40–5.90)
WBC: 4.3 10*3/uL (ref 3.8–10.6)

## 2013-01-13 LAB — BASIC METABOLIC PANEL
Anion Gap: 4 — ABNORMAL LOW (ref 7–16)
Calcium, Total: 8.4 mg/dL — ABNORMAL LOW (ref 8.5–10.1)
Chloride: 100 mmol/L (ref 98–107)
Creatinine: 0.61 mg/dL (ref 0.60–1.30)
EGFR (African American): >60
Glucose: 88 mg/dL (ref 65–99)
Potassium: 4.4 mmol/L (ref 3.5–5.1)
Sodium: 132 mmol/L — ABNORMAL LOW (ref 136–145)

## 2013-01-13 LAB — CARBAMAZEPINE LEVEL, TOTAL: Carbamazepine: 8.6 ug/mL (ref 4.0–12.0)

## 2013-01-15 ENCOUNTER — Inpatient Hospital Stay: Payer: Self-pay | Admitting: Psychiatry

## 2013-01-17 LAB — TSH: Thyroid Stimulating Horm: 3.68 u[IU]/mL

## 2013-01-17 LAB — COMPREHENSIVE METABOLIC PANEL
Albumin: 3.9 g/dL (ref 3.4–5.0)
Alkaline Phosphatase: 81 U/L (ref 50–136)
Anion Gap: 6 — ABNORMAL LOW (ref 7–16)
Bilirubin,Total: 0.3 mg/dL (ref 0.2–1.0)
Calcium, Total: 8.5 mg/dL (ref 8.5–10.1)
Chloride: 109 mmol/L — ABNORMAL HIGH (ref 98–107)
Co2: 24 mmol/L (ref 21–32)
EGFR (Non-African Amer.): >60
Glucose: 60 mg/dL — ABNORMAL LOW (ref 65–99)
Osmolality: 273 (ref 275–301)
Sodium: 139 mmol/L (ref 136–145)
Total Protein: 8.1 g/dL (ref 6.4–8.2)

## 2013-01-17 LAB — ETHANOL
Ethanol %: 0.101 % — ABNORMAL HIGH (ref 0.000–0.080)
Ethanol: 101 mg/dL

## 2013-01-17 LAB — CBC
HCT: 40.8 % (ref 40.0–52.0)
MCH: 32.4 pg (ref 26.0–34.0)
MCHC: 34.1 g/dL (ref 32.0–36.0)
Platelet: 259 10*3/uL (ref 150–440)
RBC: 4.3 10*6/uL — ABNORMAL LOW (ref 4.40–5.90)
RDW: 13.5 % (ref 11.5–14.5)

## 2013-01-17 LAB — ACETAMINOPHEN LEVEL: Acetaminophen: <2 ug/mL

## 2013-01-17 LAB — URINALYSIS, COMPLETE
Bacteria: NONE SEEN
Blood: NEGATIVE
Ketone: NEGATIVE
Protein: NEGATIVE
Specific Gravity: 1.002 (ref 1.003–1.030)
WBC UR: NONE SEEN /HPF (ref 0–5)

## 2013-01-17 LAB — SALICYLATE LEVEL: Salicylates, Serum: <1.7 mg/dL

## 2013-01-18 ENCOUNTER — Inpatient Hospital Stay: Payer: Self-pay | Admitting: Psychiatry

## 2013-01-18 ENCOUNTER — Inpatient Hospital Stay: Payer: Self-pay | Admitting: Internal Medicine

## 2013-01-18 LAB — DRUG SCREEN, URINE
Barbiturates, Ur Screen: NEGATIVE (ref ?–200)
Benzodiazepine, Ur Scrn: NEGATIVE (ref ?–200)
Cocaine Metabolite,Ur ~~LOC~~: NEGATIVE (ref ?–300)
Methadone, Ur Screen: NEGATIVE (ref ?–300)

## 2013-02-09 LAB — LITHIUM LEVEL: Lithium: 0.78 mmol/L

## 2013-02-17 LAB — BASIC METABOLIC PANEL
Anion Gap: 6 — ABNORMAL LOW (ref 7–16)
Calcium, Total: 9.1 mg/dL (ref 8.5–10.1)
Creatinine: 0.95 mg/dL (ref 0.60–1.30)
EGFR (African American): >60
EGFR (Non-African Amer.): >60
Osmolality: 269 (ref 275–301)
Potassium: 4.1 mmol/L (ref 3.5–5.1)
Sodium: 134 mmol/L — ABNORMAL LOW (ref 136–145)

## 2013-02-17 LAB — LITHIUM LEVEL: Lithium: 0.72 mmol/L

## 2013-02-19 ENCOUNTER — Inpatient Hospital Stay: Payer: Self-pay | Admitting: Psychiatry

## 2013-02-19 LAB — COMPREHENSIVE METABOLIC PANEL
Alkaline Phosphatase: 76 U/L (ref 50–136)
BUN: 8 mg/dL (ref 7–18)
Bilirubin,Total: 0.3 mg/dL (ref 0.2–1.0)
Calcium, Total: 9 mg/dL (ref 8.5–10.1)
Chloride: 105 mmol/L (ref 98–107)
Creatinine: 0.88 mg/dL (ref 0.60–1.30)
EGFR (African American): >60
EGFR (Non-African Amer.): >60
Glucose: 199 mg/dL — ABNORMAL HIGH (ref 65–99)
Osmolality: 281 (ref 275–301)
Potassium: 3.4 mmol/L — ABNORMAL LOW (ref 3.5–5.1)

## 2013-02-19 LAB — URINALYSIS, COMPLETE
Bilirubin,UR: NEGATIVE
Blood: NEGATIVE
Ph: 8 (ref 4.5–8.0)
RBC,UR: <1 /HPF (ref 0–5)
Specific Gravity: 1.002 (ref 1.003–1.030)
Squamous Epithelial: NONE SEEN
WBC UR: <1 /HPF (ref 0–5)

## 2013-02-19 LAB — CBC WITH DIFFERENTIAL/PLATELET
Basophil #: 0.1 10*3/uL (ref 0.0–0.1)
Basophil %: 0.8 %
HGB: 13.5 g/dL (ref 13.0–18.0)
Lymphocyte #: 2.9 10*3/uL (ref 1.0–3.6)
MCH: 30.9 pg (ref 26.0–34.0)
MCV: 89 fL (ref 80–100)
Monocyte #: 0.4 x10 3/mm (ref 0.2–1.0)
Platelet: 386 10*3/uL (ref 150–440)

## 2013-02-20 LAB — CBC WITH DIFFERENTIAL/PLATELET
Basophil #: 0.1 10*3/uL (ref 0.0–0.1)
Basophil %: 0.3 %
Eosinophil %: 0 %
HCT: 39.2 % — ABNORMAL LOW (ref 40.0–52.0)
HGB: 13.1 g/dL (ref 13.0–18.0)
MCH: 29.9 pg (ref 26.0–34.0)
MCHC: 33.3 g/dL (ref 32.0–36.0)
Monocyte %: 8.4 %
Platelet: 369 10*3/uL (ref 150–440)
RBC: 4.37 10*6/uL — ABNORMAL LOW (ref 4.40–5.90)
WBC: 23.2 10*3/uL — ABNORMAL HIGH (ref 3.8–10.6)

## 2013-02-20 LAB — BASIC METABOLIC PANEL
BUN: 10 mg/dL (ref 7–18)
Chloride: 108 mmol/L — ABNORMAL HIGH (ref 98–107)
Creatinine: 0.81 mg/dL (ref 0.60–1.30)
Glucose: 95 mg/dL (ref 65–99)
Potassium: 3.9 mmol/L (ref 3.5–5.1)

## 2013-02-20 LAB — DRUG SCREEN, URINE
Amphetamines, Ur Screen: NEGATIVE (ref ?–1000)
Cocaine Metabolite,Ur ~~LOC~~: NEGATIVE (ref ?–300)
MDMA (Ecstasy)Ur Screen: NEGATIVE (ref ?–500)
Methadone, Ur Screen: NEGATIVE (ref ?–300)
Phencyclidine (PCP) Ur S: NEGATIVE (ref ?–25)

## 2013-02-20 LAB — LITHIUM LEVEL
Lithium: 2.35 mmol/L
Lithium: 2.86 mmol/L

## 2013-02-20 LAB — LIPID PANEL
Cholesterol: 145 mg/dL (ref 0–200)
HDL Cholesterol: 55 mg/dL (ref 40–60)
Triglycerides: 108 mg/dL (ref 0–200)
VLDL Cholesterol, Calc: 22 mg/dL (ref 5–40)

## 2013-02-20 LAB — HEMOGLOBIN A1C: Hemoglobin A1C: 5 % (ref 4.2–6.3)

## 2013-02-20 LAB — T4, FREE: Free Thyroxine: 0.59 ng/dL — ABNORMAL LOW (ref 0.76–1.46)

## 2013-02-21 LAB — BASIC METABOLIC PANEL
Anion Gap: 4 — ABNORMAL LOW (ref 7–16)
Anion Gap: 4 — ABNORMAL LOW (ref 7–16)
Calcium, Total: 8.9 mg/dL (ref 8.5–10.1)
Chloride: 100 mmol/L (ref 98–107)
Co2: 30 mmol/L (ref 21–32)
Creatinine: 0.72 mg/dL (ref 0.60–1.30)
EGFR (African American): >60
EGFR (African American): >60
EGFR (Non-African Amer.): >60
Glucose: 90 mg/dL (ref 65–99)
Glucose: 112 mg/dL — ABNORMAL HIGH (ref 65–99)
Osmolality: 267 (ref 275–301)
Potassium: 4.2 mmol/L (ref 3.5–5.1)
Potassium: 4.5 mmol/L (ref 3.5–5.1)
Sodium: 134 mmol/L — ABNORMAL LOW (ref 136–145)

## 2013-02-21 LAB — CBC WITH DIFFERENTIAL/PLATELET
Basophil #: 0.1 10*3/uL (ref 0.0–0.1)
Eosinophil %: 4.2 %
HCT: 36 % — ABNORMAL LOW (ref 40.0–52.0)
HGB: 12.1 g/dL — ABNORMAL LOW (ref 13.0–18.0)
Lymphocyte %: 13 %
MCH: 30.6 pg (ref 26.0–34.0)
MCHC: 33.6 g/dL (ref 32.0–36.0)
Monocyte #: 1.1 x10 3/mm — ABNORMAL HIGH (ref 0.2–1.0)
Monocyte %: 10.6 %
Neutrophil %: 71.4 %
Platelet: 244 10*3/uL (ref 150–440)
RBC: 3.96 10*6/uL — ABNORMAL LOW (ref 4.40–5.90)
RDW: 13.3 % (ref 11.5–14.5)
WBC: 10.3 10*3/uL (ref 3.8–10.6)

## 2013-02-21 LAB — LITHIUM LEVEL: Lithium: 1.64 mmol/L

## 2013-02-22 LAB — LITHIUM LEVEL: Lithium: 0.95 mmol/L

## 2013-02-25 LAB — CREATININE, SERUM
Creatinine: 0.83 mg/dL (ref 0.60–1.30)
EGFR (Non-African Amer.): >60

## 2013-03-02 LAB — HEMOGLOBIN A1C: Hemoglobin A1C: 4.8 % (ref 4.2–6.3)

## 2013-03-02 LAB — PLATELET COUNT: Platelet: 281 10*3/uL (ref 150–440)

## 2013-03-10 LAB — PLATELET COUNT: Platelet: 188 10*3/uL (ref 150–440)

## 2013-03-14 LAB — PLATELET COUNT: Platelet: 216 10*3/uL (ref 150–440)

## 2013-08-03 ENCOUNTER — Inpatient Hospital Stay: Payer: Self-pay | Admitting: Student

## 2013-08-03 LAB — COMPREHENSIVE METABOLIC PANEL
Albumin: 4.5 g/dL (ref 3.4–5.0)
Anion Gap: 7 (ref 7–16)
BUN: 4 mg/dL — ABNORMAL LOW (ref 7–18)
Bilirubin,Total: 0.2 mg/dL (ref 0.2–1.0)
Calcium, Total: 9.3 mg/dL (ref 8.5–10.1)
Co2: 23 mmol/L (ref 21–32)
EGFR (Non-African Amer.): >60
Glucose: 41 mg/dL — ABNORMAL LOW (ref 65–99)
Potassium: 3.5 mmol/L (ref 3.5–5.1)
Sodium: 134 mmol/L — ABNORMAL LOW (ref 136–145)

## 2013-08-03 LAB — URINALYSIS, COMPLETE
Bacteria: NONE SEEN
Blood: NEGATIVE
Glucose,UR: 100 mg/dL (ref 0–75)
Ketone: NEGATIVE
Leukocyte Esterase: NEGATIVE
Nitrite: NEGATIVE
Ph: 6 (ref 4.5–8.0)
Protein: NEGATIVE
Specific Gravity: 1 (ref 1.003–1.030)
WBC UR: <1 /HPF (ref 0–5)

## 2013-08-03 LAB — DRUG SCREEN, URINE
Barbiturates, Ur Screen: NEGATIVE (ref ?–200)
Benzodiazepine, Ur Scrn: NEGATIVE (ref ?–200)
Cocaine Metabolite,Ur ~~LOC~~: NEGATIVE (ref ?–300)
MDMA (Ecstasy)Ur Screen: NEGATIVE (ref ?–500)
Methadone, Ur Screen: NEGATIVE (ref ?–300)
Phencyclidine (PCP) Ur S: NEGATIVE (ref ?–25)
Tricyclic, Ur Screen: NEGATIVE (ref ?–1000)

## 2013-08-03 LAB — CBC
HCT: 51.6 % (ref 40.0–52.0)
MCH: 28.1 pg (ref 26.0–34.0)
MCHC: 33.2 g/dL (ref 32.0–36.0)
MCV: 85 fL (ref 80–100)
RBC: 6.09 10*6/uL — ABNORMAL HIGH (ref 4.40–5.90)
RDW: 15.7 % — ABNORMAL HIGH (ref 11.5–14.5)

## 2013-08-03 LAB — ETHANOL
Ethanol %: 0.2 % — ABNORMAL HIGH (ref 0.000–0.080)
Ethanol: 200 mg/dL

## 2013-08-03 LAB — ACETAMINOPHEN LEVEL: Acetaminophen: <2 ug/mL

## 2013-08-03 LAB — SALICYLATE LEVEL: Salicylates, Serum: 2.7 mg/dL

## 2013-08-04 LAB — HEMOGLOBIN A1C: Hemoglobin A1C: 5.3 % (ref 4.2–6.3)

## 2013-08-05 LAB — CBC WITH DIFFERENTIAL/PLATELET
HGB: 12.9 g/dL — ABNORMAL LOW (ref 13.0–18.0)
Lymphocyte %: 38 %
MCH: 28.4 pg (ref 26.0–34.0)
MCHC: 33.3 g/dL (ref 32.0–36.0)
MCV: 85 fL (ref 80–100)
Monocyte %: 9.1 %
Neutrophil %: 47.2 %
Platelet: 153 10*3/uL (ref 150–440)
RDW: 16.2 % — ABNORMAL HIGH (ref 11.5–14.5)
WBC: 4.3 10*3/uL (ref 3.8–10.6)

## 2013-08-05 LAB — BASIC METABOLIC PANEL
BUN: 4 mg/dL — ABNORMAL LOW (ref 7–18)
Calcium, Total: 8.3 mg/dL — ABNORMAL LOW (ref 8.5–10.1)
Creatinine: 0.86 mg/dL (ref 0.60–1.30)
EGFR (African American): >60
Osmolality: 273 (ref 275–301)
Sodium: 138 mmol/L (ref 136–145)

## 2013-08-07 ENCOUNTER — Inpatient Hospital Stay: Payer: Self-pay | Admitting: Internal Medicine

## 2013-08-07 LAB — COMPREHENSIVE METABOLIC PANEL
Albumin: 3.2 g/dL — ABNORMAL LOW (ref 3.4–5.0)
Anion Gap: 9 (ref 7–16)
Chloride: 103 mmol/L (ref 98–107)
EGFR (African American): >60
EGFR (Non-African Amer.): >60
Glucose: 135 mg/dL — ABNORMAL HIGH (ref 65–99)
Osmolality: 271 (ref 275–301)
Potassium: 3.8 mmol/L (ref 3.5–5.1)
SGOT(AST): 48 U/L — ABNORMAL HIGH (ref 15–37)
SGPT (ALT): 37 U/L (ref 12–78)
Total Protein: 6.2 g/dL — ABNORMAL LOW (ref 6.4–8.2)

## 2013-08-07 LAB — DRUG SCREEN, URINE
Amphetamines, Ur Screen: NEGATIVE (ref ?–1000)
Barbiturates, Ur Screen: NEGATIVE (ref ?–200)
Benzodiazepine, Ur Scrn: POSITIVE (ref ?–200)
Cannabinoid 50 Ng, Ur ~~LOC~~: NEGATIVE (ref ?–50)
Cocaine Metabolite,Ur ~~LOC~~: NEGATIVE (ref ?–300)
Phencyclidine (PCP) Ur S: NEGATIVE (ref ?–25)
Tricyclic, Ur Screen: NEGATIVE (ref ?–1000)

## 2013-08-07 LAB — CBC
HCT: 38.2 % — ABNORMAL LOW (ref 40.0–52.0)
Platelet: 171 10*3/uL (ref 150–440)
RBC: 4.47 10*6/uL (ref 4.40–5.90)
RDW: 16.2 % — ABNORMAL HIGH (ref 11.5–14.5)

## 2013-08-07 LAB — URINALYSIS, COMPLETE
Bilirubin,UR: NEGATIVE
Glucose,UR: 100 mg/dL (ref 0–75)
Ketone: NEGATIVE
Nitrite: NEGATIVE
Protein: NEGATIVE
RBC,UR: 1 /HPF (ref 0–5)
Specific Gravity: 1.005 (ref 1.003–1.030)
WBC UR: 1 /HPF (ref 0–5)

## 2013-08-07 LAB — ACETAMINOPHEN LEVEL: Acetaminophen: 15 ug/mL

## 2013-08-07 LAB — SALICYLATE LEVEL: Salicylates, Serum: <1.7 mg/dL

## 2013-08-07 LAB — ETHANOL: Ethanol %: 0.008 % (ref 0.000–0.080)

## 2013-08-08 LAB — CBC WITH DIFFERENTIAL/PLATELET
Basophil #: 0 10*3/uL (ref 0.0–0.1)
Basophil %: 0.6 %
Eosinophil %: 3.3 %
HGB: 12.4 g/dL — ABNORMAL LOW (ref 13.0–18.0)
Lymphocyte #: 1.3 10*3/uL (ref 1.0–3.6)
MCH: 28.5 pg (ref 26.0–34.0)
MCV: 85 fL (ref 80–100)
Monocyte %: 14.3 %
WBC: 4 10*3/uL (ref 3.8–10.6)

## 2013-08-08 LAB — BASIC METABOLIC PANEL
BUN: 9 mg/dL (ref 7–18)
Calcium, Total: 9 mg/dL (ref 8.5–10.1)
Co2: 31 mmol/L (ref 21–32)
Creatinine: 0.73 mg/dL (ref 0.60–1.30)
EGFR (Non-African Amer.): >60
Glucose: 112 mg/dL — ABNORMAL HIGH (ref 65–99)
Osmolality: 279 (ref 275–301)
Sodium: 140 mmol/L (ref 136–145)

## 2013-09-10 ENCOUNTER — Observation Stay: Payer: Self-pay | Admitting: Internal Medicine

## 2013-09-10 LAB — CBC
HCT: 46 % (ref 40.0–52.0)
HGB: 15.7 g/dL (ref 13.0–18.0)
MCH: 29.2 pg (ref 26.0–34.0)
MCHC: 34.2 g/dL (ref 32.0–36.0)
Platelet: 199 10*3/uL (ref 150–440)
RBC: 5.39 10*6/uL (ref 4.40–5.90)
RDW: 18 % — ABNORMAL HIGH (ref 11.5–14.5)

## 2013-09-10 LAB — COMPREHENSIVE METABOLIC PANEL
Albumin: 4.1 g/dL (ref 3.4–5.0)
Alkaline Phosphatase: 103 U/L (ref 50–136)
Chloride: 104 mmol/L (ref 98–107)
Co2: 21 mmol/L (ref 21–32)
Creatinine: 0.95 mg/dL (ref 0.60–1.30)
EGFR (Non-African Amer.): >60
Osmolality: 270 (ref 275–301)
SGOT(AST): 38 U/L — ABNORMAL HIGH (ref 15–37)
SGPT (ALT): 43 U/L (ref 12–78)
Sodium: 137 mmol/L (ref 136–145)
Total Protein: 7.9 g/dL (ref 6.4–8.2)

## 2013-09-10 LAB — URINALYSIS, COMPLETE
Bacteria: NONE SEEN
Blood: NEGATIVE
Hyaline Cast: 2
Ketone: NEGATIVE
Nitrite: NEGATIVE
Ph: 5 (ref 4.5–8.0)
Protein: 30
RBC,UR: <1 /HPF (ref 0–5)
Specific Gravity: 1.02 (ref 1.003–1.030)
WBC UR: 1 /HPF (ref 0–5)

## 2013-09-10 LAB — TSH: Thyroid Stimulating Horm: 0.81 u[IU]/mL

## 2013-09-10 LAB — DRUG SCREEN, URINE
Amphetamines, Ur Screen: NEGATIVE (ref ?–1000)
Barbiturates, Ur Screen: NEGATIVE (ref ?–200)
Cocaine Metabolite,Ur ~~LOC~~: NEGATIVE (ref ?–300)
MDMA (Ecstasy)Ur Screen: NEGATIVE (ref ?–500)

## 2013-09-10 LAB — ETHANOL
Ethanol %: 0.064 % (ref 0.000–0.080)
Ethanol: 64 mg/dL

## 2013-09-11 LAB — CBC WITH DIFFERENTIAL/PLATELET
Basophil #: 0 10*3/uL (ref 0.0–0.1)
Basophil %: 0.5 %
HCT: 40.2 % (ref 40.0–52.0)
HGB: 13.6 g/dL (ref 13.0–18.0)
MCHC: 33.7 g/dL (ref 32.0–36.0)
Monocyte #: 0.7 x10 3/mm (ref 0.2–1.0)
Monocyte %: 11.3 %
Platelet: 132 10*3/uL — ABNORMAL LOW (ref 150–440)
RBC: 4.67 10*6/uL (ref 4.40–5.90)
RDW: 17.9 % — ABNORMAL HIGH (ref 11.5–14.5)

## 2013-09-11 LAB — BASIC METABOLIC PANEL
Calcium, Total: 9.3 mg/dL (ref 8.5–10.1)
Chloride: 104 mmol/L (ref 98–107)
Creatinine: 0.82 mg/dL (ref 0.60–1.30)
EGFR (African American): >60
EGFR (Non-African Amer.): >60
Glucose: 90 mg/dL (ref 65–99)
Sodium: 137 mmol/L (ref 136–145)

## 2013-09-13 ENCOUNTER — Inpatient Hospital Stay: Payer: Self-pay | Admitting: Student

## 2013-09-13 LAB — CBC
HCT: 40.6 % (ref 40.0–52.0)
MCV: 87 fL (ref 80–100)
Platelet: 162 10*3/uL (ref 150–440)
RBC: 4.65 10*6/uL (ref 4.40–5.90)

## 2013-09-13 LAB — DRUG SCREEN, URINE
Amphetamines, Ur Screen: NEGATIVE (ref ?–1000)
Barbiturates, Ur Screen: NEGATIVE (ref ?–200)
Benzodiazepine, Ur Scrn: NEGATIVE (ref ?–200)
Cocaine Metabolite,Ur ~~LOC~~: NEGATIVE (ref ?–300)
MDMA (Ecstasy)Ur Screen: NEGATIVE (ref ?–500)
Methadone, Ur Screen: NEGATIVE (ref ?–300)
Opiate, Ur Screen: NEGATIVE (ref ?–300)
Phencyclidine (PCP) Ur S: NEGATIVE (ref ?–25)

## 2013-09-13 LAB — URINALYSIS, COMPLETE
Bacteria: NONE SEEN
Blood: NEGATIVE
Glucose,UR: 50 mg/dL (ref 0–75)
Leukocyte Esterase: NEGATIVE
Nitrite: NEGATIVE
Ph: 6 (ref 4.5–8.0)
Protein: NEGATIVE
RBC,UR: NONE SEEN /HPF (ref 0–5)
Specific Gravity: 1.003 (ref 1.003–1.030)
Squamous Epithelial: NONE SEEN
WBC UR: 2 /HPF (ref 0–5)

## 2013-09-13 LAB — COMPREHENSIVE METABOLIC PANEL
Albumin: 3.8 g/dL (ref 3.4–5.0)
Anion Gap: 8 (ref 7–16)
Bilirubin,Total: 0.3 mg/dL (ref 0.2–1.0)
Calcium, Total: 8.6 mg/dL (ref 8.5–10.1)
Co2: 22 mmol/L (ref 21–32)
Creatinine: 0.76 mg/dL (ref 0.60–1.30)
EGFR (African American): >60
Osmolality: 267 (ref 275–301)
SGOT(AST): 44 U/L — ABNORMAL HIGH (ref 15–37)
SGPT (ALT): 55 U/L (ref 12–78)
Sodium: 133 mmol/L — ABNORMAL LOW (ref 136–145)
Total Protein: 7.1 g/dL (ref 6.4–8.2)

## 2013-09-13 LAB — ETHANOL: Ethanol %: 0.132 % — ABNORMAL HIGH (ref 0.000–0.080)

## 2013-09-13 LAB — ACETAMINOPHEN LEVEL: Acetaminophen: <2 ug/mL

## 2013-09-13 LAB — SALICYLATE LEVEL: Salicylates, Serum: <1.7 mg/dL

## 2013-09-14 LAB — BASIC METABOLIC PANEL
BUN: 7 mg/dL (ref 7–18)
Chloride: 105 mmol/L (ref 98–107)
Co2: 26 mmol/L (ref 21–32)
Creatinine: 0.83 mg/dL (ref 0.60–1.30)
EGFR (African American): >60
EGFR (Non-African Amer.): >60
Glucose: 78 mg/dL (ref 65–99)
Sodium: 136 mmol/L (ref 136–145)

## 2013-09-18 LAB — CREATININE, SERUM: EGFR (African American): >60

## 2013-09-22 ENCOUNTER — Observation Stay: Payer: Self-pay | Admitting: Internal Medicine

## 2013-09-22 LAB — CBC
HCT: 43.7 % (ref 40.0–52.0)
MCHC: 33.4 g/dL (ref 32.0–36.0)
MCV: 87 fL (ref 80–100)
RDW: 18.2 % — ABNORMAL HIGH (ref 11.5–14.5)

## 2013-09-22 LAB — COMPREHENSIVE METABOLIC PANEL
Anion Gap: 8 (ref 7–16)
Bilirubin,Total: 0.2 mg/dL (ref 0.2–1.0)
Calcium, Total: 8.5 mg/dL (ref 8.5–10.1)
Co2: 20 mmol/L — ABNORMAL LOW (ref 21–32)
EGFR (African American): >60
EGFR (Non-African Amer.): >60
Osmolality: 267 (ref 275–301)
Potassium: 3.7 mmol/L (ref 3.5–5.1)
SGOT(AST): 37 U/L (ref 15–37)
SGPT (ALT): 64 U/L (ref 12–78)
Sodium: 135 mmol/L — ABNORMAL LOW (ref 136–145)

## 2013-09-22 LAB — URINALYSIS, COMPLETE
Bilirubin,UR: NEGATIVE
Leukocyte Esterase: NEGATIVE
Protein: NEGATIVE
Squamous Epithelial: NONE SEEN
WBC UR: <1 /HPF (ref 0–5)

## 2013-09-22 LAB — DRUG SCREEN, URINE
Amphetamines, Ur Screen: NEGATIVE (ref ?–1000)
Barbiturates, Ur Screen: NEGATIVE (ref ?–200)
Cannabinoid 50 Ng, Ur ~~LOC~~: NEGATIVE (ref ?–50)
Cocaine Metabolite,Ur ~~LOC~~: NEGATIVE (ref ?–300)
MDMA (Ecstasy)Ur Screen: NEGATIVE (ref ?–500)
Opiate, Ur Screen: NEGATIVE (ref ?–300)
Phencyclidine (PCP) Ur S: NEGATIVE (ref ?–25)
Tricyclic, Ur Screen: NEGATIVE (ref ?–1000)

## 2013-09-22 LAB — ACETAMINOPHEN LEVEL
Acetaminophen: 28 ug/mL
Acetaminophen: <2 ug/mL

## 2013-09-22 LAB — TROPONIN I: Troponin-I: <0.02 ng/mL

## 2013-09-22 LAB — ETHANOL: Ethanol %: 0.174 % — ABNORMAL HIGH (ref 0.000–0.080)

## 2013-10-05 ENCOUNTER — Emergency Department: Payer: Self-pay | Admitting: Emergency Medicine

## 2013-10-09 ENCOUNTER — Inpatient Hospital Stay: Payer: Self-pay | Admitting: Internal Medicine

## 2013-10-09 LAB — URINALYSIS, COMPLETE
Bacteria: NONE SEEN
Bilirubin,UR: NEGATIVE
Blood: NEGATIVE
Leukocyte Esterase: NEGATIVE
Nitrite: NEGATIVE
RBC,UR: <1 /HPF (ref 0–5)
Squamous Epithelial: NONE SEEN
WBC UR: <1 /HPF (ref 0–5)

## 2013-10-09 LAB — COMPREHENSIVE METABOLIC PANEL
Anion Gap: 10 (ref 7–16)
Bilirubin,Total: 0.4 mg/dL (ref 0.2–1.0)
Co2: 21 mmol/L (ref 21–32)
Creatinine: 0.85 mg/dL (ref 0.60–1.30)
EGFR (African American): >60
EGFR (Non-African Amer.): >60
Glucose: 64 mg/dL — ABNORMAL LOW (ref 65–99)
Osmolality: 266 (ref 275–301)
Potassium: 3.7 mmol/L (ref 3.5–5.1)
SGOT(AST): 23 U/L (ref 15–37)
SGPT (ALT): 20 U/L (ref 12–78)

## 2013-10-09 LAB — ACETAMINOPHEN LEVEL: Acetaminophen: <2 ug/mL

## 2013-10-09 LAB — ETHANOL: Ethanol: 120 mg/dL

## 2013-10-09 LAB — DRUG SCREEN, URINE
Amphetamines, Ur Screen: NEGATIVE (ref ?–1000)
Benzodiazepine, Ur Scrn: NEGATIVE (ref ?–200)
Cocaine Metabolite,Ur ~~LOC~~: NEGATIVE (ref ?–300)
Opiate, Ur Screen: NEGATIVE (ref ?–300)

## 2013-10-09 LAB — CBC
HCT: 40.4 % (ref 40.0–52.0)
HGB: 13.6 g/dL (ref 13.0–18.0)
MCH: 29.8 pg (ref 26.0–34.0)
MCHC: 33.7 g/dL (ref 32.0–36.0)
MCV: 89 fL (ref 80–100)
Platelet: 172 10*3/uL (ref 150–440)

## 2013-10-10 LAB — BASIC METABOLIC PANEL
Anion Gap: 5 — ABNORMAL LOW (ref 7–16)
BUN: 10 mg/dL (ref 7–18)
Chloride: 104 mmol/L (ref 98–107)
Co2: 26 mmol/L (ref 21–32)
Creatinine: 0.83 mg/dL (ref 0.60–1.30)
EGFR (Non-African Amer.): >60
Osmolality: 266 (ref 275–301)
Sodium: 135 mmol/L — ABNORMAL LOW (ref 136–145)

## 2013-10-10 LAB — CBC WITH DIFFERENTIAL/PLATELET
Basophil #: 0 10*3/uL (ref 0.0–0.1)
Eosinophil #: 0.2 10*3/uL (ref 0.0–0.7)
HCT: 40 % (ref 40.0–52.0)
HGB: 13.5 g/dL (ref 13.0–18.0)
Lymphocyte %: 26.5 %
MCH: 29.7 pg (ref 26.0–34.0)
MCHC: 33.7 g/dL (ref 32.0–36.0)
MCV: 88 fL (ref 80–100)
Monocyte %: 12.5 %
Neutrophil %: 58.1 %
RBC: 4.53 10*6/uL (ref 4.40–5.90)
RDW: 18.8 % — ABNORMAL HIGH (ref 11.5–14.5)
WBC: 6.3 10*3/uL (ref 3.8–10.6)

## 2013-10-15 ENCOUNTER — Inpatient Hospital Stay: Payer: Self-pay | Admitting: Internal Medicine

## 2013-10-15 LAB — CBC WITH DIFFERENTIAL/PLATELET
Basophil #: 0 10*3/uL (ref 0.0–0.1)
Eosinophil %: 3.7 %
Lymphocyte #: 1.4 10*3/uL (ref 1.0–3.6)
Lymphocyte %: 32.5 %
MCH: 30.4 pg (ref 26.0–34.0)
MCHC: 33.7 g/dL (ref 32.0–36.0)
Monocyte #: 0.4 x10 3/mm (ref 0.2–1.0)
Monocyte %: 8.9 %
Neutrophil #: 2.3 10*3/uL (ref 1.4–6.5)
Platelet: 234 10*3/uL (ref 150–440)
RBC: 4.53 10*6/uL (ref 4.40–5.90)
RDW: 18.5 % — ABNORMAL HIGH (ref 11.5–14.5)
WBC: 4.2 10*3/uL (ref 3.8–10.6)

## 2013-10-15 LAB — COMPREHENSIVE METABOLIC PANEL
Alkaline Phosphatase: 139 U/L — ABNORMAL HIGH
Anion Gap: 4 — ABNORMAL LOW (ref 7–16)
BUN: 11 mg/dL (ref 7–18)
Bilirubin,Total: 0.2 mg/dL (ref 0.2–1.0)
Co2: 27 mmol/L (ref 21–32)
Creatinine: 0.93 mg/dL (ref 0.60–1.30)
EGFR (African American): >60
Glucose: 74 mg/dL (ref 65–99)
Potassium: 3.7 mmol/L (ref 3.5–5.1)
SGOT(AST): 33 U/L (ref 15–37)
Sodium: 140 mmol/L (ref 136–145)
Total Protein: 7.4 g/dL (ref 6.4–8.2)

## 2013-10-15 LAB — ETHANOL
Ethanol %: <0.003 % (ref 0.000–0.080)
Ethanol: <3 mg/dL

## 2013-10-15 LAB — SALICYLATE LEVEL: Salicylates, Serum: 2.9 mg/dL — ABNORMAL HIGH

## 2013-10-15 LAB — ACETAMINOPHEN LEVEL: Acetaminophen: <2 ug/mL

## 2013-10-16 LAB — URINALYSIS, COMPLETE
Bilirubin,UR: NEGATIVE
Glucose,UR: 50 mg/dL (ref 0–75)
Ketone: NEGATIVE
Leukocyte Esterase: NEGATIVE
Ph: 6 (ref 4.5–8.0)
RBC,UR: NONE SEEN /HPF (ref 0–5)
Squamous Epithelial: NONE SEEN
WBC UR: NONE SEEN /HPF (ref 0–5)

## 2013-10-16 LAB — CBC WITH DIFFERENTIAL/PLATELET
Basophil #: 0.1 10*3/uL (ref 0.0–0.1)
Basophil %: 0.8 %
Eosinophil #: 0.2 10*3/uL (ref 0.0–0.7)
Eosinophil %: 3 %
Lymphocyte %: 17.9 %
MCH: 29.6 pg (ref 26.0–34.0)
MCHC: 33.2 g/dL (ref 32.0–36.0)
MCV: 89 fL (ref 80–100)
Monocyte #: 1.2 x10 3/mm — ABNORMAL HIGH (ref 0.2–1.0)
Monocyte %: 15.6 %
Neutrophil #: 5 10*3/uL (ref 1.4–6.5)
Neutrophil %: 62.7 %
RBC: 4.12 10*6/uL — ABNORMAL LOW (ref 4.40–5.90)
RDW: 18.4 % — ABNORMAL HIGH (ref 11.5–14.5)
WBC: 7.9 10*3/uL (ref 3.8–10.6)

## 2013-10-16 LAB — DRUG SCREEN, URINE
Benzodiazepine, Ur Scrn: NEGATIVE (ref ?–200)
Cocaine Metabolite,Ur ~~LOC~~: NEGATIVE (ref ?–300)
MDMA (Ecstasy)Ur Screen: POSITIVE (ref ?–500)
Methadone, Ur Screen: NEGATIVE (ref ?–300)
Opiate, Ur Screen: NEGATIVE (ref ?–300)

## 2013-10-16 LAB — BASIC METABOLIC PANEL
Anion Gap: 3 — ABNORMAL LOW (ref 7–16)
BUN: 9 mg/dL (ref 7–18)
Chloride: 107 mmol/L (ref 98–107)
EGFR (African American): >60
EGFR (Non-African Amer.): >60
Potassium: 3.4 mmol/L — ABNORMAL LOW (ref 3.5–5.1)
Sodium: 138 mmol/L (ref 136–145)

## 2013-10-17 LAB — BASIC METABOLIC PANEL
Anion Gap: 7 (ref 7–16)
BUN: 9 mg/dL (ref 7–18)
Calcium, Total: 8.9 mg/dL (ref 8.5–10.1)
Chloride: 105 mmol/L (ref 98–107)
Creatinine: 0.63 mg/dL (ref 0.60–1.30)
EGFR (Non-African Amer.): >60
Osmolality: 275 (ref 275–301)
Sodium: 138 mmol/L (ref 136–145)

## 2013-10-17 LAB — MAGNESIUM: Magnesium: 1.9 mg/dL

## 2013-10-24 ENCOUNTER — Inpatient Hospital Stay: Payer: Self-pay | Admitting: Internal Medicine

## 2013-10-24 LAB — URINALYSIS, COMPLETE
Bilirubin,UR: NEGATIVE
Blood: NEGATIVE
Glucose,UR: 150 mg/dL (ref 0–75)
Leukocyte Esterase: NEGATIVE
Nitrite: NEGATIVE
Protein: NEGATIVE
RBC,UR: NONE SEEN /HPF (ref 0–5)
Specific Gravity: 1.003 (ref 1.003–1.030)
WBC UR: <1 /HPF (ref 0–5)

## 2013-10-24 LAB — DRUG SCREEN, URINE
Amphetamines, Ur Screen: NEGATIVE (ref ?–1000)
Barbiturates, Ur Screen: NEGATIVE (ref ?–200)
Cannabinoid 50 Ng, Ur ~~LOC~~: NEGATIVE (ref ?–50)
Cocaine Metabolite,Ur ~~LOC~~: NEGATIVE (ref ?–300)
MDMA (Ecstasy)Ur Screen: POSITIVE (ref ?–500)
Phencyclidine (PCP) Ur S: NEGATIVE (ref ?–25)
Tricyclic, Ur Screen: NEGATIVE (ref ?–1000)

## 2013-10-24 LAB — COMPREHENSIVE METABOLIC PANEL
Alkaline Phosphatase: 148 U/L — ABNORMAL HIGH
Anion Gap: 7 (ref 7–16)
BUN: 3 mg/dL — ABNORMAL LOW (ref 7–18)
Bilirubin,Total: 0.2 mg/dL (ref 0.2–1.0)
Calcium, Total: 8.3 mg/dL — ABNORMAL LOW (ref 8.5–10.1)
Co2: 22 mmol/L (ref 21–32)
Creatinine: 0.9 mg/dL (ref 0.60–1.30)
EGFR (African American): >60
EGFR (Non-African Amer.): >60
Glucose: 156 mg/dL — ABNORMAL HIGH (ref 65–99)
Osmolality: 274 (ref 275–301)
SGOT(AST): 33 U/L (ref 15–37)
Total Protein: 7.1 g/dL (ref 6.4–8.2)

## 2013-10-24 LAB — CBC
HCT: 38.5 % — ABNORMAL LOW (ref 40.0–52.0)
HGB: 13.1 g/dL (ref 13.0–18.0)
MCV: 89 fL (ref 80–100)
Platelet: 357 10*3/uL (ref 150–440)
RBC: 4.32 10*6/uL — ABNORMAL LOW (ref 4.40–5.90)
RDW: 18.3 % — ABNORMAL HIGH (ref 11.5–14.5)
WBC: 8.1 10*3/uL (ref 3.8–10.6)

## 2013-10-24 LAB — ACETAMINOPHEN LEVEL: Acetaminophen: <2 ug/mL

## 2013-10-24 LAB — SALICYLATE LEVEL: Salicylates, Serum: 1.8 mg/dL

## 2013-10-25 LAB — COMPREHENSIVE METABOLIC PANEL
Albumin: 3.6 g/dL (ref 3.4–5.0)
Alkaline Phosphatase: 146 U/L — ABNORMAL HIGH
Anion Gap: 7 (ref 7–16)
Bilirubin,Total: 0.2 mg/dL (ref 0.2–1.0)
Calcium, Total: 8.7 mg/dL (ref 8.5–10.1)
Co2: 26 mmol/L (ref 21–32)
Creatinine: 1.02 mg/dL (ref 0.60–1.30)
EGFR (Non-African Amer.): >60
Osmolality: 269 (ref 275–301)
Potassium: 4.2 mmol/L (ref 3.5–5.1)
Sodium: 136 mmol/L (ref 136–145)
Total Protein: 6.8 g/dL (ref 6.4–8.2)

## 2013-10-25 LAB — CBC WITH DIFFERENTIAL/PLATELET
Basophil #: 0.1 10*3/uL (ref 0.0–0.1)
Eosinophil #: 0.2 10*3/uL (ref 0.0–0.7)
Eosinophil %: 2.6 %
Lymphocyte %: 32.7 %
MCHC: 33.7 g/dL (ref 32.0–36.0)
Monocyte %: 14.2 %
Neutrophil %: 49.3 %
Platelet: 309 10*3/uL (ref 150–440)
RBC: 4.4 10*6/uL (ref 4.40–5.90)
RDW: 18.2 % — ABNORMAL HIGH (ref 11.5–14.5)
WBC: 7.2 10*3/uL (ref 3.8–10.6)

## 2013-10-28 ENCOUNTER — Emergency Department: Payer: Self-pay

## 2013-10-28 LAB — COMPREHENSIVE METABOLIC PANEL
Albumin: 3.9 g/dL (ref 3.4–5.0)
Anion Gap: 7 (ref 7–16)
BUN: 9 mg/dL (ref 7–18)
Bilirubin,Total: 0.3 mg/dL (ref 0.2–1.0)
Calcium, Total: 9.2 mg/dL (ref 8.5–10.1)
Co2: 24 mmol/L (ref 21–32)
Creatinine: 1.02 mg/dL (ref 0.60–1.30)
EGFR (African American): >60
EGFR (Non-African Amer.): >60
Potassium: 3.9 mmol/L (ref 3.5–5.1)
Sodium: 138 mmol/L (ref 136–145)

## 2013-10-28 LAB — URINALYSIS, COMPLETE
Bilirubin,UR: NEGATIVE
Ketone: NEGATIVE
Nitrite: NEGATIVE
Protein: 30
RBC,UR: NONE SEEN /HPF (ref 0–5)
Specific Gravity: 1.009 (ref 1.003–1.030)
Squamous Epithelial: NONE SEEN
WBC UR: <1 /HPF (ref 0–5)

## 2013-10-28 LAB — CBC
HCT: 39.6 % — ABNORMAL LOW (ref 40.0–52.0)
HGB: 13.2 g/dL (ref 13.0–18.0)
RBC: 4.48 10*6/uL (ref 4.40–5.90)
RDW: 17.4 % — ABNORMAL HIGH (ref 11.5–14.5)
WBC: 7 10*3/uL (ref 3.8–10.6)

## 2013-10-28 LAB — DRUG SCREEN, URINE
Amphetamines, Ur Screen: NEGATIVE (ref ?–1000)
Barbiturates, Ur Screen: NEGATIVE (ref ?–200)
Benzodiazepine, Ur Scrn: NEGATIVE (ref ?–200)
Cannabinoid 50 Ng, Ur ~~LOC~~: NEGATIVE (ref ?–50)

## 2013-10-28 LAB — ETHANOL: Ethanol: <3 mg/dL

## 2013-10-28 LAB — ACETAMINOPHEN LEVEL: Acetaminophen: <2 ug/mL

## 2013-10-28 LAB — SALICYLATE LEVEL: Salicylates, Serum: <1.7 mg/dL

## 2013-11-04 ENCOUNTER — Emergency Department: Payer: Self-pay | Admitting: Emergency Medicine

## 2013-11-04 ENCOUNTER — Inpatient Hospital Stay (HOSPITAL_COMMUNITY)
Admit: 2013-11-04 | Discharge: 2013-11-11 | DRG: 918 | Disposition: A | Discharge status: Home / Self Care | Payer: Medicare Other | Source: Other Acute Inpatient Hospital | Attending: Internal Medicine | Admitting: Internal Medicine

## 2013-11-04 DIAGNOSIS — F319 Bipolar disorder, unspecified: Secondary | ICD-10-CM | POA: Diagnosis present

## 2013-11-04 DIAGNOSIS — K219 Gastro-esophageal reflux disease without esophagitis: Secondary | ICD-10-CM | POA: Diagnosis present

## 2013-11-04 DIAGNOSIS — E46 Unspecified protein-calorie malnutrition: Secondary | ICD-10-CM | POA: Diagnosis present

## 2013-11-04 DIAGNOSIS — F1010 Alcohol abuse, uncomplicated: Secondary | ICD-10-CM | POA: Diagnosis present

## 2013-11-04 DIAGNOSIS — G40909 Epilepsy, unspecified, not intractable, without status epilepticus: Secondary | ICD-10-CM | POA: Diagnosis present

## 2013-11-04 DIAGNOSIS — R0781 Pleurodynia: Secondary | ICD-10-CM

## 2013-11-04 DIAGNOSIS — Z91199 Patient's noncompliance with other medical treatment and regimen due to unspecified reason: Secondary | ICD-10-CM

## 2013-11-04 DIAGNOSIS — Z6829 Body mass index (BMI) 29.0-29.9, adult: Secondary | ICD-10-CM

## 2013-11-04 DIAGNOSIS — Z418 Encounter for other procedures for purposes other than remedying health state: Secondary | ICD-10-CM

## 2013-11-04 DIAGNOSIS — F4321 Adjustment disorder with depressed mood: Secondary | ICD-10-CM

## 2013-11-04 DIAGNOSIS — Z6828 Body mass index (BMI) 28.0-28.9, adult: Secondary | ICD-10-CM

## 2013-11-04 DIAGNOSIS — T391X1A Poisoning by 4-Aminophenol derivatives, accidental (unintentional), initial encounter: Secondary | ICD-10-CM | POA: Diagnosis present

## 2013-11-04 DIAGNOSIS — Z2989 Encounter for other specified prophylactic measures: Secondary | ICD-10-CM

## 2013-11-04 DIAGNOSIS — Z9119 Patient's noncompliance with other medical treatment and regimen: Secondary | ICD-10-CM

## 2013-11-04 DIAGNOSIS — T383X1A Poisoning by insulin and oral hypoglycemic [antidiabetic] drugs, accidental (unintentional), initial encounter: Principal | ICD-10-CM | POA: Diagnosis present

## 2013-11-04 DIAGNOSIS — T424X4A Poisoning by benzodiazepines, undetermined, initial encounter: Secondary | ICD-10-CM | POA: Diagnosis present

## 2013-11-04 DIAGNOSIS — F314 Bipolar disorder, current episode depressed, severe, without psychotic features: Secondary | ICD-10-CM

## 2013-11-04 DIAGNOSIS — Z720 Tobacco use: Secondary | ICD-10-CM | POA: Diagnosis present

## 2013-11-04 DIAGNOSIS — Z22322 Carrier or suspected carrier of Methicillin resistant Staphylococcus aureus: Secondary | ICD-10-CM

## 2013-11-04 DIAGNOSIS — E162 Hypoglycemia, unspecified: Secondary | ICD-10-CM

## 2013-11-04 DIAGNOSIS — F17200 Nicotine dependence, unspecified, uncomplicated: Secondary | ICD-10-CM | POA: Diagnosis present

## 2013-11-04 DIAGNOSIS — T50902A Poisoning by unspecified drugs, medicaments and biological substances, intentional self-harm, initial encounter: Secondary | ICD-10-CM

## 2013-11-04 DIAGNOSIS — R45851 Suicidal ideations: Secondary | ICD-10-CM

## 2013-11-04 DIAGNOSIS — I10 Essential (primary) hypertension: Secondary | ICD-10-CM | POA: Diagnosis present

## 2013-11-04 HISTORY — DX: Essential (primary) hypertension: I10

## 2013-11-04 HISTORY — DX: Gastro-esophageal reflux disease without esophagitis: K21.9

## 2013-11-04 HISTORY — DX: Major depressive disorder, single episode, unspecified: F32.9

## 2013-11-04 HISTORY — DX: Depression, unspecified: F32.A

## 2013-11-04 HISTORY — DX: Anxiety disorder, unspecified: F41.9

## 2013-11-04 LAB — TROPONIN I: Troponin-I: <0.02 ng/mL

## 2013-11-04 LAB — COMPREHENSIVE METABOLIC PANEL
BUN: 4 mg/dL — ABNORMAL LOW (ref 7–18)
Co2: 19 mmol/L — ABNORMAL LOW (ref 21–32)
Creatinine: 1.03 mg/dL (ref 0.60–1.30)
EGFR (Non-African Amer.): >60
Glucose: 122 mg/dL — ABNORMAL HIGH (ref 65–99)
Osmolality: 266 (ref 275–301)
SGOT(AST): 29 U/L (ref 15–37)
SGPT (ALT): 22 U/L (ref 12–78)
Sodium: 134 mmol/L — ABNORMAL LOW (ref 136–145)

## 2013-11-04 LAB — CBC
HCT: 38.7 % — ABNORMAL LOW (ref 40.0–52.0)
HGB: 12.8 g/dL — ABNORMAL LOW (ref 13.0–18.0)
MCHC: 33.2 g/dL (ref 32.0–36.0)
Platelet: 239 10*3/uL (ref 150–440)
WBC: 5.5 10*3/uL (ref 3.8–10.6)

## 2013-11-04 LAB — ETHANOL: Ethanol: 193 mg/dL

## 2013-11-04 LAB — SALICYLATE LEVEL: Salicylates, Serum: <1.7 mg/dL

## 2013-11-04 LAB — PROTIME-INR: Prothrombin Time: 13.6 secs (ref 11.5–14.7)

## 2013-11-05 ENCOUNTER — Encounter (HOSPITAL_COMMUNITY): Payer: Self-pay | Admitting: Internal Medicine

## 2013-11-05 ENCOUNTER — Inpatient Hospital Stay (HOSPITAL_COMMUNITY): Payer: Medicare Other

## 2013-11-05 DIAGNOSIS — T43502A Poisoning by unspecified antipsychotics and neuroleptics, intentional self-harm, initial encounter: Secondary | ICD-10-CM

## 2013-11-05 DIAGNOSIS — T383X1A Poisoning by insulin and oral hypoglycemic [antidiabetic] drugs, accidental (unintentional), initial encounter: Principal | ICD-10-CM

## 2013-11-05 DIAGNOSIS — T50902A Poisoning by unspecified drugs, medicaments and biological substances, intentional self-harm, initial encounter: Secondary | ICD-10-CM

## 2013-11-05 DIAGNOSIS — Z720 Tobacco use: Secondary | ICD-10-CM | POA: Diagnosis present

## 2013-11-05 DIAGNOSIS — T50901A Poisoning by unspecified drugs, medicaments and biological substances, accidental (unintentional), initial encounter: Secondary | ICD-10-CM

## 2013-11-05 DIAGNOSIS — Z91199 Patient's noncompliance with other medical treatment and regimen due to unspecified reason: Secondary | ICD-10-CM

## 2013-11-05 DIAGNOSIS — G40909 Epilepsy, unspecified, not intractable, without status epilepticus: Secondary | ICD-10-CM | POA: Diagnosis present

## 2013-11-05 DIAGNOSIS — T50991A Poisoning by other drugs, medicaments and biological substances, accidental (unintentional), initial encounter: Secondary | ICD-10-CM

## 2013-11-05 DIAGNOSIS — T424X4A Poisoning by benzodiazepines, undetermined, initial encounter: Secondary | ICD-10-CM

## 2013-11-05 DIAGNOSIS — F1010 Alcohol abuse, uncomplicated: Secondary | ICD-10-CM

## 2013-11-05 DIAGNOSIS — F3130 Bipolar disorder, current episode depressed, mild or moderate severity, unspecified: Secondary | ICD-10-CM

## 2013-11-05 DIAGNOSIS — I10 Essential (primary) hypertension: Secondary | ICD-10-CM | POA: Diagnosis present

## 2013-11-05 DIAGNOSIS — T50992A Poisoning by other drugs, medicaments and biological substances, intentional self-harm, initial encounter: Secondary | ICD-10-CM

## 2013-11-05 DIAGNOSIS — Z9119 Patient's noncompliance with other medical treatment and regimen: Secondary | ICD-10-CM

## 2013-11-05 DIAGNOSIS — F314 Bipolar disorder, current episode depressed, severe, without psychotic features: Secondary | ICD-10-CM

## 2013-11-05 LAB — GLUCOSE, CAPILLARY
Glucose-Capillary: 41 mg/dL — CL (ref 70–99)
Glucose-Capillary: 80 mg/dL (ref 70–99)
Glucose-Capillary: 55 mg/dL — ABNORMAL LOW (ref 70–99)
Glucose-Capillary: 112 mg/dL — ABNORMAL HIGH (ref 70–99)
Glucose-Capillary: 58 mg/dL — ABNORMAL LOW (ref 70–99)
Glucose-Capillary: 106 mg/dL — ABNORMAL HIGH (ref 70–99)
Glucose-Capillary: 84 mg/dL (ref 70–99)
Glucose-Capillary: 51 mg/dL — ABNORMAL LOW (ref 70–99)
Glucose-Capillary: 108 mg/dL — ABNORMAL HIGH (ref 70–99)
Glucose-Capillary: 142 mg/dL — ABNORMAL HIGH (ref 70–99)
Glucose-Capillary: 127 mg/dL — ABNORMAL HIGH (ref 70–99)
Glucose-Capillary: 144 mg/dL — ABNORMAL HIGH (ref 70–99)
Glucose-Capillary: 132 mg/dL — ABNORMAL HIGH (ref 70–99)

## 2013-11-05 LAB — COMPREHENSIVE METABOLIC PANEL
ALT: 17 U/L (ref 0–53)
AST: 24 U/L (ref 0–37)
Albumin: 4 g/dL (ref 3.5–5.2)
Alkaline Phosphatase: 115 U/L (ref 39–117)
Chloride: 99 mEq/L (ref 96–112)
Potassium: 3.6 mEq/L (ref 3.5–5.1)
Total Bilirubin: 0.2 mg/dL — ABNORMAL LOW (ref 0.3–1.2)

## 2013-11-05 LAB — CBC
HCT: 40.2 % (ref 39.0–52.0)
Hemoglobin: 13.6 g/dL (ref 13.0–17.0)
MCH: 30.2 pg (ref 26.0–34.0)
RBC: 4.51 MIL/uL (ref 4.22–5.81)
RDW: 15.7 % — ABNORMAL HIGH (ref 11.5–15.5)
WBC: 4.9 10*3/uL (ref 4.0–10.5)

## 2013-11-05 LAB — ACETAMINOPHEN LEVEL: Acetaminophen (Tylenol), Serum: <15 ug/mL (ref 10–30)

## 2013-11-05 MED ORDER — KETOROLAC TROMETHAMINE 30 MG/ML IJ SOLN
30.0000 mg | Freq: Once | INTRAMUSCULAR | Status: AC
  Administered 2013-11-05: 30 mg via INTRAVENOUS
  Filled 2013-11-05: qty 1

## 2013-11-05 MED ORDER — DEXTROSE 10 % IV SOLN
INTRAVENOUS | Status: DC
  Administered 2013-11-05: 01:00:00 via INTRAVENOUS
  Filled 2013-11-05 (×6): qty 1000

## 2013-11-05 MED ORDER — DEXTROSE 5 % IV SOLN
INTRAVENOUS | Status: DC
  Administered 2013-11-05: 19:00:00 via INTRAVENOUS
  Administered 2013-11-06 (×2): 1000 mL via INTRAVENOUS

## 2013-11-05 MED ORDER — ONDANSETRON HCL 4 MG/2ML IJ SOLN: 4.0000 mg | Freq: Once | INTRAMUSCULAR | Status: DC

## 2013-11-05 MED ORDER — CHLORHEXIDINE GLUCONATE CLOTH 2 % EX PADS
6.0000 | MEDICATED_PAD | Freq: Every day | CUTANEOUS | Status: AC
  Administered 2013-11-05 – 2013-11-08 (×4): 6 via TOPICAL

## 2013-11-05 MED ORDER — DEXTROSE 50 % IV SOLN
25.0000 mL | Freq: Once | INTRAVENOUS | Status: AC | PRN
  Administered 2013-11-05: 10:00:00 via INTRAVENOUS
  Filled 2013-11-05: qty 50

## 2013-11-05 MED ORDER — DEXTROSE 50 % IV SOLN
50.0000 mL | Freq: Once | INTRAVENOUS | Status: AC | PRN
  Administered 2013-11-05: 50 mL via INTRAVENOUS

## 2013-11-05 MED ORDER — GLUCOSE-VITAMIN C 4-6 GM-MG PO CHEW
4.0000 | CHEWABLE_TABLET | ORAL | Status: DC | PRN
  Administered 2013-11-05: 4 via ORAL
  Filled 2013-11-05: qty 1

## 2013-11-05 MED ORDER — FAMOTIDINE 40 MG PO TABS
40.0000 mg | ORAL_TABLET | Freq: Every day | ORAL | Status: DC
  Administered 2013-11-05 – 2013-11-11 (×7): 40 mg via ORAL
  Filled 2013-11-05 (×7): qty 1

## 2013-11-05 MED ORDER — K PHOS MONO-SOD PHOS DI & MONO 155-852-130 MG PO TABS
250.0000 mg | ORAL_TABLET | Freq: Three times a day (TID) | ORAL | Status: DC
  Filled 2013-11-05 (×3): qty 1

## 2013-11-05 MED ORDER — ADULT MULTIVITAMIN W/MINERALS CH
1.0000 | ORAL_TABLET | Freq: Every day | ORAL | Status: DC
  Administered 2013-11-05 – 2013-11-11 (×7): 1 via ORAL
  Filled 2013-11-05 (×8): qty 1

## 2013-11-05 MED ORDER — FOLIC ACID 1 MG PO TABS
1.0000 mg | ORAL_TABLET | Freq: Every day | ORAL | Status: DC
  Administered 2013-11-05 – 2013-11-11 (×7): 1 mg via ORAL
  Filled 2013-11-05 (×8): qty 1

## 2013-11-05 MED ORDER — DEXTROSE 50 % IV SOLN
50.0000 mL | Freq: Once | INTRAVENOUS | Status: AC | PRN
  Administered 2013-11-05: 50 mL via INTRAVENOUS
  Filled 2013-11-05: qty 50

## 2013-11-05 MED ORDER — AMLODIPINE BESYLATE 10 MG PO TABS
10.0000 mg | ORAL_TABLET | Freq: Every day | ORAL | Status: DC
  Administered 2013-11-05 – 2013-11-11 (×7): 10 mg via ORAL
  Filled 2013-11-05 (×7): qty 1

## 2013-11-05 MED ORDER — ENALAPRIL MALEATE 10 MG PO TABS
10.0000 mg | ORAL_TABLET | Freq: Every day | ORAL | Status: DC
  Administered 2013-11-05 – 2013-11-11 (×7): 10 mg via ORAL
  Filled 2013-11-05 (×7): qty 1

## 2013-11-05 MED ORDER — LORAZEPAM 2 MG/ML IJ SOLN
0.5000 mg | Freq: Once | INTRAMUSCULAR | Status: AC
  Administered 2013-11-05: 0.5 mg via INTRAVENOUS
  Filled 2013-11-05: qty 1

## 2013-11-05 MED ORDER — TRAMADOL HCL 50 MG PO TABS
100.0000 mg | ORAL_TABLET | Freq: Four times a day (QID) | ORAL | Status: DC | PRN
  Administered 2013-11-05 (×2): 100 mg via ORAL
  Filled 2013-11-05 (×2): qty 2

## 2013-11-05 MED ORDER — NICOTINE 14 MG/24HR TD PT24
14.0000 mg | MEDICATED_PATCH | Freq: Every day | TRANSDERMAL | Status: DC
  Administered 2013-11-05 – 2013-11-06 (×2): 14 mg via TRANSDERMAL
  Filled 2013-11-05 (×2): qty 1

## 2013-11-05 MED ORDER — VITAMIN B-1 100 MG PO TABS
100.0000 mg | ORAL_TABLET | Freq: Every day | ORAL | Status: DC
  Administered 2013-11-05 – 2013-11-11 (×7): 100 mg via ORAL
  Filled 2013-11-05 (×8): qty 1

## 2013-11-05 MED ORDER — MORPHINE SULFATE 2 MG/ML IJ SOLN
2.0000 mg | INTRAMUSCULAR | Status: DC | PRN
  Administered 2013-11-05 – 2013-11-06 (×5): 2 mg via INTRAVENOUS
  Filled 2013-11-05 (×5): qty 1

## 2013-11-05 MED ORDER — CARBAMAZEPINE ER 200 MG PO TB12
200.0000 mg | ORAL_TABLET | Freq: Two times a day (BID) | ORAL | Status: DC
  Administered 2013-11-05 – 2013-11-11 (×13): 200 mg via ORAL
  Filled 2013-11-05 (×14): qty 1

## 2013-11-05 MED ORDER — K PHOS MONO-SOD PHOS DI & MONO 155-852-130 MG PO TABS
250.0000 mg | ORAL_TABLET | Freq: Three times a day (TID) | ORAL | Status: DC
  Administered 2013-11-06 – 2013-11-07 (×4): 250 mg via ORAL
  Filled 2013-11-05 (×6): qty 1

## 2013-11-05 MED ORDER — DEXTROSE 5 % IV SOLN: INTRAVENOUS | Status: DC

## 2013-11-05 MED ORDER — IBUPROFEN 400 MG PO TABS
400.0000 mg | ORAL_TABLET | Freq: Four times a day (QID) | ORAL | Status: DC | PRN
  Administered 2013-11-05: 400 mg via ORAL
  Filled 2013-11-05: qty 1

## 2013-11-05 MED ORDER — MUPIROCIN 2 % EX OINT
1.0000 "application " | TOPICAL_OINTMENT | Freq: Two times a day (BID) | CUTANEOUS | Status: AC
  Administered 2013-11-05 – 2013-11-09 (×9): 1 via NASAL
  Filled 2013-11-05 (×2): qty 22

## 2013-11-05 MED ORDER — POTASSIUM PHOSPHATE DIBASIC 3 MMOLE/ML IV SOLN
30.0000 mmol | Freq: Once | INTRAVENOUS | Status: AC
  Administered 2013-11-05: 30 mmol via INTRAVENOUS
  Filled 2013-11-05: qty 10

## 2013-11-05 MED ORDER — GLUCOSE 40 % PO GEL: 1.0000 | ORAL | Status: DC | PRN

## 2013-11-05 MED ORDER — LORAZEPAM 2 MG/ML IJ SOLN
2.0000 mg | INTRAMUSCULAR | Status: DC | PRN
  Administered 2013-11-05 – 2013-11-07 (×12): 2 mg via INTRAVENOUS
  Filled 2013-11-05 (×13): qty 1

## 2013-11-05 MED ORDER — DEXTROSE 50 % IV SOLN
INTRAVENOUS | Status: AC
  Administered 2013-11-05: 50 mL via INTRAVENOUS
  Filled 2013-11-05: qty 50

## 2013-11-05 NOTE — Progress Notes (Signed)
[facility] Progress Note [place] TEAM 1 - Stepdown ICU Team    [name] ZOX:[id number] DOB: 27-Mar-1976 DOA: 11/04/2013 PCP: No primary provider on file.  Brief narrative:  Assessment/Plan: Active Problems:   Suicide attempt by drug ingestion/Insulin overdose/hypoglycemia -took insulin from his father- Has told psych that apparently has attempted this close to 20 times and is followed by the ACT team  -psych recommends inpt treatment once medically stable -Tylenol and ASA levels WNL -prior hypoglycemia has stabilized -cont dextrose IVF and diet    Seizure disorder -has not been taking Tegretol as prescribed for at least 2 months -pt endorsed has had seizures without meds- states quit taking due to depression    Hypophosphatemia -IV replete today since very low and begin oral replete in am -FU labs in am    Rib pain -pt says fell at home a few weeks ago- went to another ER and was told had rib fxs/requesting pain meds -check CXR reveals multiple right rib fractures - as pain uncontrolled with NSAIDS, will start IV Morphine but goal is to avoid discharge on narcotics    HTN (hypertension) -not taking meds at home -was on Norvasc and Vasotec at home -renal function is normal so will resume    Alcohol abuse/Tobacco abuse -CIWA and Nicotine patch    Situational depression -pt describes onset sx's after break up with girlfriend several months ago -Was on Klonopin and Wellbutrin prior to admission -Has not participated in either individual group psychotherapy   DVT prophylaxis: SCDs Code Status: Full Family Communication:  Patient Disposition Plan/Expected LOS: Stepdown Isolation: Contact for MRSA PCR positive status Nutritional Status: Suspect a degree of chronic protein calorie malnutrition related to underlying depression  Consultants: Psychiatry  Procedures: None  Antibiotics: None  HPI/Subjective: Patient alert with flat affect. Endorses  significant rib discomfort and requesting something for pain and for his nerves. No shortness of breath, chest pain or abdominal discomfort.  Objective: Blood pressure 154/94, pulse 88, temperature 98.1 F (36.7 C), temperature source Oral, resp. rate 16, height 6' (1.829 m), weight 208 lb 8.9 oz (94.6 kg), SpO2 97.00%.  Intake/Output Summary (Last 24 hours) at 11/05/13 1331 Last data filed at 11/05/13 1204  Gross per 24 hour  Intake   1780 ml  Output   3100 ml  Net  -1320 ml     Exam: Follow up exam completed. Patient admitted at 5:06 AM today  Scheduled Meds:  Scheduled Meds: . carbamazepine  200 mg Oral BID  . Chlorhexidine Gluconate Cloth  6 each Topical Q0600  . famotidine  40 mg Oral Daily  . folic acid  1 mg Oral Daily  . multivitamin with minerals  1 tablet Oral Daily  . mupirocin ointment  1 application Nasal BID  . nicotine  14 mg Transdermal Daily  . [START ON 11/06/2013] phosphorus  250 mg Oral TID  . potassium phosphate IVPB (mmol)  30 mmol Intravenous Once  . thiamine  100 mg Oral Daily   Continuous Infusions: . dextrose 10 % 1,000 mL infusion 150 mL/hr at 11/05/13 0700    **Reviewed in detail by the Attending Physician  Data Reviewed: Basic Metabolic Panel:  Recent Labs Lab 11/05/13 0110  NA 135  K 3.6  CL 99  CO2 18*  GLUCOSE 41*  BUN 3*  CREATININE 0.95  CALCIUM 9.6  MG 1.9  PHOS 1.1*   Liver Function Tests:  Recent Labs Lab 11/05/13 0110  AST 24  ALT 17  ALKPHOS 115  BILITOT 0.2*  PROT 7.4  ALBUMIN 4.0   No results found for this basename: LIPASE, AMYLASE,  in the last 168 hours No results found for this basename: AMMONIA,  in the last 168 hours CBC:  Recent Labs Lab 11/05/13 0110  WBC 4.9  HGB 13.6  HCT 40.2  MCV 89.1  PLT 279   Cardiac Enzymes: No results found for this basename: CKTOTAL, CKMB, CKMBINDEX, TROPONINI,  in the last 168 hours BNP (last 3 results) No results found for this basename: PROBNP,  in the last  8760 hours CBG:  Recent Labs Lab 11/05/13 0128 11/05/13 0201 11/05/13 0321 11/05/13 0358 11/05/13 0544  GLUCAP 153* 80 55* 112* 71    Recent Results (from the past 240 hour(s))  MRSA PCR SCREENING     Status: Abnormal   Collection Time    11/05/13  7:32 AM      Result Value Range Status   MRSA by PCR POSITIVE (*) NEGATIVE Final   Comment:            The GeneXpert MRSA Assay (FDA     approved for NASAL specimens     only), is one component of a     comprehensive MRSA colonization     surveillance program. It is not     intended to diagnose MRSA     infection nor to guide or     monitor treatment for     MRSA infections.     RESULT CALLED TO, READ BACK BY AND VERIFIED WITH:     Patty Sermons RN 11:30 11/05/13 ([email or web address])     Studies:  Recent x-ray studies have been reviewed in detail by the Attending Physician     Junious Silk, ANP [facility] Office  [phone] Pager [phone]  **If unable to reach the above provider after paging please contact the Flow Manager @ [phone]  On-Call/Text Page:      [email or web address]      password [email or web address]  If 7PM-7AM, please contact night-coverage [email or web address] Password [email or web address] 11/05/2013, 1:31 PM   LOS: 1 day   I have examined the patient, reviewed the chart and modified the above note which I agree with.   Edgerrin Correia,MD [phone] 11/05/2013, 6:19 PM

## 2013-11-05 NOTE — Progress Notes (Signed)
Pt complaining of rt rib pain 10/10 and he requests me to call MD. MD notified, orders received.

## 2013-11-05 NOTE — Progress Notes (Signed)
UR completed.  Shanira Tine, RN BSN [facility] CCM Trauma/Neuro ICU Case Manager [phone]  

## 2013-11-05 NOTE — H&P (Signed)
PCP: none   Chief Complaint:  Took insulin trying to kill himself.  HPI: [name] is a 37 y.o. male   has a past medical history of Depression; Anxiety; Hypertension; and GERD (gastroesophageal reflux disease).   Presented with  Suicide attempt at 8:30 pm. He took 5 pens of aspart insulin 300 units each, and new bottle of Niquil, few pills of Klonapin and drank 4 beers. 15 min later he called 911 his blood sugar was 46 he was given D50 and brought to ER at [facility]. This is not his first attempt. Patient was started on D10 but continued to have episodes of hypo glycemia. He was transferred to [facility] due to lack of ICU beds. Hospitalist called for admission. Acetaminophen level went down to 36. Patient reports pain in ribs after fall 2-3 weeks ago requesting percocet or Vicodin.    Review of Systems:    Pertinent positives include: suicidal ideation, tremor  Constitutional:  No weight loss, night sweats, Fevers, chills, fatigue, weight loss  HEENT:  No headaches, Difficulty swallowing,Tooth/dental problems,Sore throat,  No sneezing, itching, ear ache, nasal congestion, post nasal drip,  Cardio-vascular:  No chest pain, Orthopnea, PND, anasarca, dizziness, palpitations.no Bilateral lower extremity swelling  GI:  No heartburn, indigestion, abdominal pain, nausea, vomiting, diarrhea, change in bowel habits, loss of appetite, melena, blood in stool, hematemesis Resp:  no shortness of breath at rest. No dyspnea on exertion, No excess mucus, no productive cough, No non-productive cough, No coughing up of blood.No change in color of mucus.No wheezing. Skin:  no rash or lesions. No jaundice GU:  no dysuria, change in color of urine, no urgency or frequency. No straining to urinate.  No flank pain.  Musculoskeletal:  No joint pain or no joint swelling. No decreased range of motion. No back pain.  Psych:  No change in mood or affect. No depression or anxiety. No memory loss.   Neuro: no localizing neurological complaints, no tingling, no weakness, no double vision, no gait abnormality, no slurred speech, no confusion  Otherwise ROS are negative except for above, 10 systems were reviewed  Past Medical History: Past Medical History  Diagnosis Date  . Depression   . Anxiety   . Hypertension   . GERD (gastroesophageal reflux disease)    History reviewed. No pertinent past surgical history.   Medications: Prior to Admission medications   Medication Sig Start Date End Date Taking? Authorizing Provider  clonazePAM (KLONOPIN) 2 MG tablet Take 2 mg by mouth 3 (three) times daily as needed for anxiety.   Yes Historical Provider, MD    Allergies:  No Known Allergies  Social History:  Ambulatory   independently   Lives at  home   reports that he has been smoking.  He does not have any smokeless tobacco history on file. He reports that he drinks alcohol. He reports that he does not use illicit drugs.   Family History: family history includes CAD in his mother; Diabetes Mellitus II in his father.    Physical Exam: Patient Vitals for the past 24 hrs:  BP Temp Temp src Pulse Resp SpO2 Height Weight  11/05/13 0300 138/83 mmHg - - 93 23 97 % - -  11/05/13 0200 129/72 mmHg - - 96 21 96 % - -  11/05/13 0105 131/72 mmHg - - - - - - -  11/05/13 0100 96/64 mmHg - - 95 27 96 % - -  11/05/13 0000 144/88 mmHg - - 108 18 96 % - -  11/04/13 2345 144/88 mmHg 97.6 F (36.4 C) Oral 94 13 95 % 6' (1.829 m) 94.6 kg (208 lb 8.9 oz)    1. General:  in No Acute distress 2. Psychological: Alert and   Oriented 3. Head/ENT:   Moist   Mucous Membranes                          Head Non traumatic, neck supple                          Normal   Dentition 4. SKIN: normal   Skin turgor,  Skin clean Dry and intact no rash 5. Heart: Regular rate and rhythm no Murmur, Rub or gallop 6. Lungs: Clear to auscultation bilaterally, no wheezes or crackles   7. Abdomen: Soft, non-tender,  Non distended 8. Lower extremities: no clubbing, cyanosis, or edema 9. Neurologically Grossly intact, moving all 4 extremities equally 10. MSK: Normal range of motion  body mass index is 28.28 kg/(m^2).   Labs on Admission:   Recent Labs  11/05/13 0110  NA 135  K 3.6  CL 99  CO2 18*  GLUCOSE 41*  BUN 3*  CREATININE 0.95  CALCIUM 9.6  MG 1.9  PHOS 1.1*    Recent Labs  11/05/13 0110  AST 24  ALT 17  ALKPHOS 115  BILITOT 0.2*  PROT 7.4  ALBUMIN 4.0   No results found for this basename: LIPASE, AMYLASE,  in the last 72 hours  Recent Labs  11/05/13 0110  WBC 4.9  HGB 13.6  HCT 40.2  MCV 89.1  PLT 279   No results found for this basename: CKTOTAL, CKMB, CKMBINDEX, TROPONINI,  in the last 72 hours No results found for this basename: TSH, T4TOTAL, FREET3, T3FREE, THYROIDAB,  in the last 72 hours No results found for this basename: VITAMINB12, FOLATE, FERRITIN, TIBC, IRON, RETICCTPCT,  in the last 72 hours No results found for this basename: HGBA1C    Estimated Creatinine Clearance: 125.9 ml/min (by C-G formula based on Cr of 0.95). ABG No results found for this basename: phart, pco2, po2, hco3, tco2, acidbasedef, o2sat     No results found for this basename: DDIMER     Other results:  I have pearsonaly reviewed this: ECG REPORT  Rate: 95  Rhythm: NSR ST&T Change: no ischemia  UA neg  Cultures: No results found for this basename: sdes, specrequest, cult, reptstatus       Radiological Exams on Admission: No results found.  Chart has been reviewed  Assessment/Plan  38 yo with  Insulin overdose and Niquil overdose  Present on Admission:  Suicidal attempt - psychiatry consult in am Insulin overdose - continue D10 and encourage PO intake until BG stabalizes . Alcohol abuse - CIWA protocol Acetaminophen overdose - levels are coming down, INR WNL, LFT's wnl will monitor, no indication for mucomyst RIB pain - will give Tramadol avoid  acetaminophen, avoid IV narcotics  Prophylaxis: SCD  Protonix  CODE STATUS: FULL CODE  Other plan as per orders.  I have spent a total of 55 min on this admission  Jimmy Stipes 11/05/2013, 5:06 AM

## 2013-11-05 NOTE — Consult Note (Signed)
Reason for Consult: Depression and suicide attempt Referring Physician: [name] is an 37 y.o. male.  HPI: [name] is a 37 y.o. Disabled, single white male with history of bipolar disorder, and non compliance admitted to [facility] ICU after serious suicidal attempt with Insulin, niquil, klonopin and drank few beers with intention to kill him self and than called EMS after ten minutes, saying he regrets his behavior. He has been living in trailer park in [place], [place] team follows with him. He has ex GF occasionally visiting him and brinks beer and tobacco. He has father with limited communication and his mother passed away. He has no reported siblings and children. 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 He has seizure disorder but unable to provide information about last episode. He reportedly taking tegretol but non compliant. Patient stated that he has several previous psych hospitalization for similar conditions. Patient was transferred to [facility] due to lack of ICU beds. Patient denied family history of mental illness.   Mental Status Examination: Patient has increased psychomotor activity, poor grooming and poor eye contact. He has depressed mood and his affect was constricted. He has normal rate, rhythm, and low volume of speech. His thought process is linear and goal directed. Patient has suicidal, but denied homicidal ideations, intentions or plans. Patient has no evidence of auditory or visual hallucinations, delusions, and paranoia. He has limited IQ and has cognitive deficits. Patient has poor insight judgment and impulse control.  Past Medical History  Diagnosis Date  . Depression   . Anxiety   . Hypertension   . GERD (gastroesophageal reflux disease)     History reviewed. No pertinent past surgical history.  Family History  Problem  Relation Age of Onset  . CAD Mother   . Diabetes Mellitus II Father     Social History:  reports that he has been smoking.  He does not have any smokeless tobacco history on file. He reports that he drinks alcohol. He reports that he does not use illicit drugs.  Allergies: No Known Allergies  Medications: I have reviewed the patient's current medications.  Results for orders placed during the hospital encounter of 11/04/13 (from the past 48 hour(s))  GLUCOSE, CAPILLARY     Status: Abnormal   Collection Time    11/05/13 12:22 AM      Result Value Range   Glucose-Capillary 27 (*) 70 - 99 mg/dL  GLUCOSE, CAPILLARY     Status: Abnormal   Collection Time    11/05/13 12:58 AM      Result Value Range   Glucose-Capillary 43 (*) 70 - 99 mg/dL  COMPREHENSIVE METABOLIC PANEL     Status: Abnormal   Collection Time    11/05/13  1:10 AM      Result Value Range   Sodium 135  135 - 145 mEq/L   Potassium 3.6  3.5 - 5.1 mEq/L   Chloride 99  96 - 112 mEq/L   CO2 18 (*) 19 - 32 mEq/L   Glucose, Bld 41 (*) 70 - 99 mg/dL   Comment: CRITICAL RESULT CALLED TO, READ BACK BY AND VERIFIED WITH:     HOLMES,R/MC @0356  ON 11/05/13 BY KARCZEWSKI,S.     CRITICAL RESULT CALLED TO, READ BACK BY AND VERIFIED WITH:     K MEYRAN,RN 11/05/13 0403 RHOLMES  BUN 3 (*) 6 - 23 mg/dL   Creatinine, Ser 1.61  0.50 - 1.35 mg/dL   Calcium 9.6  8.4 - 09.6 mg/dL   Total Protein 7.4  6.0 - 8.3 g/dL   Albumin 4.0  3.5 - 5.2 g/dL   AST 24  0 - 37 U/L   ALT 17  0 - 53 U/L   Alkaline Phosphatase 115  39 - 117 U/L   Total Bilirubin 0.2 (*) 0.3 - 1.2 mg/dL   GFR calc non Af Amer >90  >90 mL/min   GFR calc Af Amer >90  >90 mL/min   Comment: (NOTE)     The eGFR has been calculated using the CKD EPI equation.     This calculation has not been validated in all clinical situations.     eGFR's persistently <90 mL/min signify possible Chronic Kidney     Disease.     Performed at [facility]  PROTIME-INR      Status: None   Collection Time    11/05/13  1:10 AM      Result Value Range   Prothrombin Time 14.4  11.6 - 15.2 seconds   INR 1.14  0.00 - 1.49  SALICYLATE LEVEL     Status: Abnormal   Collection Time    11/05/13  1:10 AM      Result Value Range   Salicylate Lvl <2.0 (*) 2.8 - 20.0 mg/dL   Comment: Performed at [facility]  ACETAMINOPHEN LEVEL     Status: Abnormal   Collection Time    11/05/13  1:10 AM      Result Value Range   Acetaminophen (Tylenol), Serum 36.2 (*) 10 - 30 ug/mL   Comment:            THERAPEUTIC CONCENTRATIONS VARY     SIGNIFICANTLY. A RANGE OF 10-30     ug/mL MAY BE AN EFFECTIVE     CONCENTRATION FOR MANY PATIENTS.     HOWEVER, SOME ARE BEST TREATED     AT CONCENTRATIONS OUTSIDE THIS     RANGE.     ACETAMINOPHEN CONCENTRATIONS     >150 ug/mL AT 4 HOURS AFTER     INGESTION AND >50 ug/mL AT 12     HOURS AFTER INGESTION ARE     OFTEN ASSOCIATED WITH TOXIC     REACTIONS.     Performed at [facility]  CBC     Status: Abnormal   Collection Time    11/05/13  1:10 AM      Result Value Range   WBC 4.9  4.0 - 10.5 K/uL   RBC 4.51  4.22 - 5.81 MIL/uL   Hemoglobin 13.6  13.0 - 17.0 g/dL   HCT 04.5  40.9 - 81.1 %   MCV 89.1  78.0 - 100.0 fL   MCH 30.2  26.0 - 34.0 pg   MCHC 33.8  30.0 - 36.0 g/dL   RDW 91.4 (*) 78.2 - 95.6 %   Platelets 279  150 - 400 K/uL  MAGNESIUM     Status: None   Collection Time    11/05/13  1:10 AM      Result Value Range   Magnesium 1.9  1.5 - 2.5 mg/dL   Comment: Performed at [facility]  PHOSPHORUS     Status: Abnormal   Collection Time    11/05/13  1:10 AM      Result Value Range   Phosphorus  1.1 (*) 2.3 - 4.6 mg/dL   Comment: Performed at [facility]  GLUCOSE, CAPILLARY     Status: Abnormal   Collection Time    11/05/13  1:10 AM      Result Value Range   Glucose-Capillary 41 (*) 70 - 99 mg/dL  GLUCOSE, CAPILLARY     Status: Abnormal    Collection Time    11/05/13  1:28 AM      Result Value Range   Glucose-Capillary 153 (*) 70 - 99 mg/dL  GLUCOSE, CAPILLARY     Status: None   Collection Time    11/05/13  2:01 AM      Result Value Range   Glucose-Capillary 80  70 - 99 mg/dL  GLUCOSE, CAPILLARY     Status: Abnormal   Collection Time    11/05/13  3:21 AM      Result Value Range   Glucose-Capillary 55 (*) 70 - 99 mg/dL   Comment 1 Notify RN     Comment 2 Documented in Chart    GLUCOSE, CAPILLARY     Status: Abnormal   Collection Time    11/05/13  3:58 AM      Result Value Range   Glucose-Capillary 112 (*) 70 - 99 mg/dL   Comment 1 Notify RN     Comment 2 Documented in Chart    GLUCOSE, CAPILLARY     Status: None   Collection Time    11/05/13  5:44 AM      Result Value Range   Glucose-Capillary 71  70 - 99 mg/dL  ACETAMINOPHEN LEVEL     Status: None   Collection Time    11/05/13  6:00 AM      Result Value Range   Acetaminophen (Tylenol), Serum <15.0  10 - 30 ug/mL   Comment:            THERAPEUTIC CONCENTRATIONS VARY     SIGNIFICANTLY. A RANGE OF 10-30     ug/mL MAY BE AN EFFECTIVE     CONCENTRATION FOR MANY PATIENTS.     HOWEVER, SOME ARE BEST TREATED     AT CONCENTRATIONS OUTSIDE THIS     RANGE.     ACETAMINOPHEN CONCENTRATIONS     >150 ug/mL AT 4 HOURS AFTER     INGESTION AND >50 ug/mL AT 12     HOURS AFTER INGESTION ARE     OFTEN ASSOCIATED WITH TOXIC     REACTIONS.  MRSA PCR SCREENING     Status: Abnormal   Collection Time    11/05/13  7:32 AM      Result Value Range   MRSA by PCR POSITIVE (*) NEGATIVE   Comment:            The GeneXpert MRSA Assay (FDA     approved for NASAL specimens     only), is one component of a     comprehensive MRSA colonization     surveillance program. It is not     intended to diagnose MRSA     infection nor to guide or     monitor treatment for     MRSA infections.     RESULT CALLED TO, READ BACK BY AND VERIFIED WITH:     EViona Gilmore RN 11:30 11/05/13  ([email or web address])    Dg Ribs Bilateral W/chest  11/05/2013   CLINICAL DATA:  Chest wall pain status post fall  EXAM: BILATERAL RIBS AND CHEST - 4+ VIEW  COMPARISON:  None.  FINDINGS: Evaluation the ribs demonstrates multiple fractures involving the lateral aspect of the 5th through the 10th ribs. The fractures involving the 9th and 10th ribs appear to be mildly depressed. There is no evidence of a pneumothorax. Correlation with flail chest is recommended. No appreciable fractures identified involving the ribs on the left. Low lung volumes. There is no evidence of pneumothorax or focal regions of consolidation or focal infiltrates.  IMPRESSION: Multiple rib fractures on the right as described above   Electronically Signed   By: Salome Holmes M.D.   On: 11/05/2013 14:17    Positive for anxiety, bad mood, behavior problems, bipolar, depression, excessive alcohol consumption, learning difficulty, mood swings and sleep disturbance Blood pressure 154/94, pulse 88, temperature 98.1 F (36.7 C), temperature source Oral, resp. rate 16, height 6' (1.829 m), weight 94.6 kg (208 lb 8.9 oz), SpO2 97.00%.   Assessment/Plan: Bipolar disorder, MRE is depression Non Compliance with medication management Status post suicide attempt with overdose  Recommendation: Case discussed with staff RN and Dr. Butler Denmark Continue current medication management and monitor of tegretol level when it is appropriate Admit to Acute in patient psychiatric services upon medically stable, needs to be free from MRSA and isolation Will ask psych social services to contact his ACT team for details or [facility] to obtain his extensive psychiatric hospitalization and treatments Appreciate psych hospitalization  Onita Pfluger,JANARDHAHA R. 11/05/2013, 2:25 PM

## 2013-11-06 LAB — DRUGS OF ABUSE SCREEN W/O ALC, ROUTINE URINE
Amphetamine Screen, Ur: NEGATIVE
Barbiturate Quant, Ur: NEGATIVE
Benzodiazepines.: NEGATIVE
Cocaine Metabolites: NEGATIVE
Creatinine,U: 21.8 mg/dL
Methadone: NEGATIVE
Opiate Screen, Urine: NEGATIVE
Phencyclidine (PCP): NEGATIVE
Propoxyphene: NEGATIVE

## 2013-11-06 LAB — COMPREHENSIVE METABOLIC PANEL
ALT: 14 U/L (ref 0–53)
AST: 17 U/L (ref 0–37)
BUN: 3 mg/dL — ABNORMAL LOW (ref 6–23)
CO2: 23 mEq/L (ref 19–32)
Calcium: 8.4 mg/dL (ref 8.4–10.5)
Chloride: 99 mEq/L (ref 96–112)
Creatinine, Ser: 0.83 mg/dL (ref 0.50–1.35)
GFR calc Af Amer: >90 mL/min (ref >90)
GFR calc non Af Amer: >90 mL/min (ref >90)
Glucose, Bld: 154 mg/dL — ABNORMAL HIGH (ref 70–99)
Total Bilirubin: 0.1 mg/dL — ABNORMAL LOW (ref 0.3–1.2)
Total Protein: 6.5 g/dL (ref 6.0–8.3)

## 2013-11-06 LAB — PROTIME-INR
INR: 1.24 (ref 0.00–1.49)
Prothrombin Time: 15.3 seconds — ABNORMAL HIGH (ref 11.6–15.2)

## 2013-11-06 LAB — GLUCOSE, CAPILLARY
Glucose-Capillary: 134 mg/dL — ABNORMAL HIGH (ref 70–99)
Glucose-Capillary: 142 mg/dL — ABNORMAL HIGH (ref 70–99)
Glucose-Capillary: 148 mg/dL — ABNORMAL HIGH (ref 70–99)
Glucose-Capillary: 156 mg/dL — ABNORMAL HIGH (ref 70–99)
Glucose-Capillary: 184 mg/dL — ABNORMAL HIGH (ref 70–99)

## 2013-11-06 MED ORDER — NICOTINE POLACRILEX 2 MG MT GUM
2.0000 mg | CHEWING_GUM | OROMUCOSAL | Status: DC | PRN
  Administered 2013-11-06 – 2013-11-11 (×8): 2 mg via ORAL
  Filled 2013-11-06 (×9): qty 1

## 2013-11-06 MED ORDER — OXYCODONE HCL 5 MG PO TABS
5.0000 mg | ORAL_TABLET | ORAL | Status: DC | PRN
  Administered 2013-11-06 (×4): 5 mg via ORAL
  Administered 2013-11-07 (×3): 10 mg via ORAL
  Filled 2013-11-06 (×2): qty 1
  Filled 2013-11-06: qty 2
  Filled 2013-11-06 (×2): qty 1
  Filled 2013-11-06 (×2): qty 2

## 2013-11-06 MED ORDER — MORPHINE SULFATE 2 MG/ML IJ SOLN: 2.0000 mg | INTRAMUSCULAR | Status: DC | PRN

## 2013-11-06 MED ORDER — IBUPROFEN 800 MG PO TABS
800.0000 mg | ORAL_TABLET | Freq: Three times a day (TID) | ORAL | Status: DC
  Administered 2013-11-06 – 2013-11-07 (×3): 800 mg via ORAL
  Filled 2013-11-06 (×7): qty 1

## 2013-11-06 MED ORDER — SODIUM CHLORIDE 0.9 % IV SOLN
INTRAVENOUS | Status: DC
  Administered 2013-11-06: 75 mL via INTRAVENOUS

## 2013-11-06 MED ORDER — NICOTINE 21 MG/24HR TD PT24
21.0000 mg | MEDICATED_PATCH | Freq: Every day | TRANSDERMAL | Status: DC
  Administered 2013-11-06 – 2013-11-10 (×5): 21 mg via TRANSDERMAL
  Filled 2013-11-06 (×7): qty 1

## 2013-11-06 NOTE — Clinical Social Work Psych Note (Signed)
Psych CSW reviewed chart.  Pt will be referred to [facility] once medically dc.  Psych CSW now following and available for assistance.  Vickii Penna, LCSWA [phone]  Clinical Social Work

## 2013-11-06 NOTE — Plan of Care (Signed)
Please note this patient DOES NOT have an active MRSA infection and is only colonizing based on nasal swabs. Per CDC recs for the acute care setting he must be on contact precautions. He is currently on nasal [facility] and after a 7 day treatment nasal PCR can be repeated.  Junious Silk, ANP

## 2013-11-06 NOTE — Progress Notes (Signed)
[facility] Progress Note  TEAM 1 - Stepdown ICU Team    [name] UJW:[id number] DOB: 03-07-1976 DOA: 11/04/2013 PCP: No primary provider on file.  Brief narrative: 37 year old male patient with known history of depression and hypertension. Presented to the emergency department with severe hypoglycemia. He intentionally injected 5 pens of aspart insulin (300 units each ) as well as took a whole bottle of NyQuil, several pills of Klonopin and drank 4 beers. After about 15-20 minutes he decided to call EMS. Upon arrival of  EMS his blood sugar was noted to be 46 and he was given D50 and brought to the emergency department at [facility]. Per their records this was not his first suicide attempt in niece had multiple psychiatric hospitalizations. He was started on a DT and effusion but due to the lack of ICU beds was subsequently transferred to [facility]. Upon arrival and acetaminophen level was repeated and was down to 36. Apparently was reported as much higher at the other facility. Patient reported he had been having significant pain in his ribs after fall 2-3 weeks ago apparently was diagnosed with rib fractures and was given Vicodin at that time.  Assessment/Plan:    Suicide attempt by drug ingestion/Insulin overdose/hypoglycemia -took insulin from his father- Has told psych that apparently has attempted this close to 20 times and is followed by the ACT team  -Psych recommends inpt treatment once medically stable -Tylenol and ASA levels WNL -hypoglycemia has stabilized -dc dextrose and change to NS IVF - cont diet and follow CBG    Seizure disorder -has not been taking Tegretol as prescribed for at least 2 months - prescribed ER formulation here but will need short acting at dc since on diasbility -pt endorsed has had seizures without meds- states quit taking due to depression   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 -cont Bactroban -does  not need contact isolation outside the acute care setting - OK to dc to psych setting with colonization    Hypophosphatemia -IV repleted -cont oral replete -FU labs in am    Rib fractures -pt says fell at home a few weeks ago- went to another ER and was told had rib fxs/requesting pain meds -CXR here revealed multiple right rib non-displaced fractures - as pain uncontrolled with NSAIDS will use short term IV Morphine for severe pain - added PO oxycodone for moderate pain     HTN  -was not taking meds at home -was supposed to be on Norvasc and Vasotec at home -renal function was normal so resumed above meds    Alcohol abuse/Tobacco abuse -CIWA and Nicotine patch    Situational depression -pt describes onset sx's after break up with girlfriend several months ago -Was on Klonopin and Wellbutrin prior to admission -Has not participated in either individual group psychotherapy   DVT prophylaxis: SCDs Code Status: Full Family Communication:  Patient Disposition Plan/Expected LOS: Transfer to floor with sitter Isolation: Contact for MRSA PCR positive status  Consultants: Psychiatry  Procedures: None  Antibiotics: None  HPI/Subjective: Patient alert and ambulatory in room. Still with right side rib pain.  Objective: Blood pressure 142/85, pulse 72, temperature 97.6 F (36.4 C), temperature source Oral, resp. rate 15, height 6' (1.829 m), weight 215 lb 6.2 oz (97.7 kg), SpO2 97.00%.  Intake/Output Summary (Last 24 hours) at 11/06/13 1250 Last data filed at 11/06/13 1200  Gross per 24 hour  Intake   5960 ml  Output   5225 ml  Net    735 ml   Exam: General: No acute respiratory distress Lungs: Clear to auscultation bilaterally without wheezes or crackles, RA Cardiovascular: Regular rate and rhythm without murmur gallop or rub normal S1 and S2, no peripheral edema or JVD Abdomen: Nontender, nondistended, soft, bowel sounds positive, no rebound, no ascites, no appreciable  mass Musculoskeletal: No significant cyanosis, clubbing of bilateral lower extremities Neurological: Alert and oriented x 3, moves all extremities x 4 without focal neurological deficits, CN 2-12 intact  Scheduled Meds:  Scheduled Meds: . amLODipine  10 mg Oral Daily  . carbamazepine  200 mg Oral BID  . Chlorhexidine Gluconate Cloth  6 each Topical Q0600  . enalapril  10 mg Oral Daily  . famotidine  40 mg Oral Daily  . folic acid  1 mg Oral Daily  . ibuprofen  800 mg Oral TID  . multivitamin with minerals  1 tablet Oral Daily  . mupirocin ointment  1 application Nasal BID  . nicotine  14 mg Transdermal Daily  . phosphorus  250 mg Oral TID  . thiamine  100 mg Oral Daily   Data Reviewed: Basic Metabolic Panel:  Recent Labs Lab 11/05/13 0110 11/06/13 0347  NA 135 134*  K 3.6 3.5  CL 99 99  CO2 18* 23  GLUCOSE 41* 154*  BUN 3* 3*  CREATININE 0.95 0.83  CALCIUM 9.6 8.4  MG 1.9  --   PHOS 1.1*  --    Liver Function Tests:  Recent Labs Lab 11/05/13 0110 11/06/13 0347  AST 24 17  ALT 17 14  ALKPHOS 115 103  BILITOT 0.2* 0.1*  PROT 7.4 6.5  ALBUMIN 4.0 3.4*   CBC:  Recent Labs Lab 11/05/13 0110  WBC 4.9  HGB 13.6  HCT 40.2  MCV 89.1  PLT 279   CBG:  Recent Labs Lab 11/05/13 2202 11/05/13 2258 11/06/13 0001 11/06/13 0100 11/06/13 0202  GLUCAP 156* 142* 134* 184* 148*    Recent Results (from the past 240 hour(s))  MRSA PCR SCREENING     Status: Abnormal   Collection Time    11/05/13  7:32 AM      Result Value Range Status   MRSA by PCR POSITIVE (*) NEGATIVE Final   Comment:            The GeneXpert MRSA Assay (FDA     approved for NASAL specimens     only), is one component of a     comprehensive MRSA colonization     surveillance program. It is not     intended to diagnose MRSA     infection nor to guide or     monitor treatment for     MRSA infections.     RESULT CALLED TO, READ BACK BY AND VERIFIED WITH:     Patty Sermons RN 11:30  11/05/13 ([email or web address])     Studies:  Recent x-ray studies have been reviewed in detail by the Attending Physician     Junious Silk, ANP [facility] Office  [phone] Pager [phone]  **If unable to reach the above provider after paging please contact the Flow Manager @ [phone]  On-Call/Text Page:      [email or web address]      password [email or web address]  If 7PM-7AM, please contact night-coverage [email or web address] Password [email or web address] 11/06/2013, 12:50 PM   LOS: 2 days   I have personally examined this patient and reviewed the entire database. I have reviewed the above note, made any necessary editorial changes, and agree  with its content.  Lonia Blood, MD [facility]

## 2013-11-07 DIAGNOSIS — G40909 Epilepsy, unspecified, not intractable, without status epilepticus: Secondary | ICD-10-CM

## 2013-11-07 DIAGNOSIS — R079 Chest pain, unspecified: Secondary | ICD-10-CM

## 2013-11-07 LAB — GLUCOSE, CAPILLARY
Glucose-Capillary: 124 mg/dL — ABNORMAL HIGH (ref 70–99)
Glucose-Capillary: 125 mg/dL — ABNORMAL HIGH (ref 70–99)
Glucose-Capillary: 139 mg/dL — ABNORMAL HIGH (ref 70–99)
Glucose-Capillary: 148 mg/dL — ABNORMAL HIGH (ref 70–99)
Glucose-Capillary: 99 mg/dL (ref 70–99)

## 2013-11-07 LAB — COMPREHENSIVE METABOLIC PANEL
Albumin: 3.7 g/dL (ref 3.5–5.2)
Alkaline Phosphatase: 112 U/L (ref 39–117)
BUN: 3 mg/dL — ABNORMAL LOW (ref 6–23)
CO2: 23 mEq/L (ref 19–32)
Calcium: 8.7 mg/dL (ref 8.4–10.5)
Chloride: 99 mEq/L (ref 96–112)
GFR calc Af Amer: >90 mL/min (ref >90)
GFR calc non Af Amer: >90 mL/min (ref >90)
Glucose, Bld: 97 mg/dL (ref 70–99)
Potassium: 4.1 mEq/L (ref 3.5–5.1)
Total Bilirubin: 0.2 mg/dL — ABNORMAL LOW (ref 0.3–1.2)

## 2013-11-07 LAB — PHOSPHORUS: Phosphorus: 3.7 mg/dL (ref 2.3–4.6)

## 2013-11-07 LAB — PROTIME-INR: Prothrombin Time: 12.8 seconds (ref 11.6–15.2)

## 2013-11-07 MED ORDER — OXYCODONE HCL 5 MG PO TABS
5.0000 mg | ORAL_TABLET | Freq: Four times a day (QID) | ORAL | Status: DC | PRN
  Administered 2013-11-07 – 2013-11-08 (×4): 5 mg via ORAL
  Filled 2013-11-07 (×5): qty 1

## 2013-11-07 MED ORDER — LORATADINE 10 MG PO TABS
10.0000 mg | ORAL_TABLET | Freq: Every day | ORAL | Status: DC | PRN
  Administered 2013-11-07: 10 mg via ORAL
  Filled 2013-11-07: qty 1

## 2013-11-07 MED ORDER — ALUM & MAG HYDROXIDE-SIMETH 200-200-20 MG/5ML PO SUSP
15.0000 mL | Freq: Four times a day (QID) | ORAL | Status: DC | PRN
  Administered 2013-11-07 – 2013-11-08 (×3): 15 mL via ORAL
  Filled 2013-11-07 (×3): qty 30

## 2013-11-07 MED ORDER — IBUPROFEN 400 MG PO TABS: 400.0000 mg | ORAL_TABLET | Freq: Four times a day (QID) | ORAL | Status: DC | PRN

## 2013-11-07 MED ORDER — LORAZEPAM 1 MG PO TABS
1.0000 mg | ORAL_TABLET | ORAL | Status: DC | PRN
  Administered 2013-11-07 (×3): 1 mg via ORAL
  Administered 2013-11-08 (×2): 2 mg via ORAL
  Administered 2013-11-08 (×2): 1 mg via ORAL
  Administered 2013-11-08: 2 mg via ORAL
  Administered 2013-11-09 – 2013-11-10 (×3): 1 mg via ORAL
  Filled 2013-11-07: qty 2
  Filled 2013-11-07: qty 1
  Filled 2013-11-07: qty 2
  Filled 2013-11-07 (×7): qty 1
  Filled 2013-11-07: qty 2

## 2013-11-07 NOTE — Clinical Social Work Psych Note (Signed)
Psych CSW received call from MD Lendell Caprice to complete IVC paperwork.  This paperwork has been completed and faxed to magistrate.  Pt should not leave [facility] with paperwork being rescinded (if medically appropriate).  Please contact Psych CSW for guidance on this matter.  Vickii Penna, LCSWA [phone]  Clinical Social Work

## 2013-11-07 NOTE — Progress Notes (Addendum)
Chart reviewed. D/w SW and RN  [facility] Progress Note   [name] ZOX:[id number] DOB: February 25, 1976 DOA: 11/04/2013 PCP: No primary provider on file.  Brief narrative: 37 year old male patient with known history of depression and hypertension. Presented to the emergency department with severe hypoglycemia. He intentionally injected 5 pens of aspart insulin (300 units each ) as well as took a whole bottle of NyQuil, several pills of Klonopin and drank 4 beers. After about 15-20 minutes he decided to call EMS. Upon arrival of  EMS his blood sugar was noted to be 46 and he was given D50 and brought to the emergency department at [facility]. Per their records this was not his first suicide attempt in niece had multiple psychiatric hospitalizations. He was started on a DT and effusion but due to the lack of ICU beds was subsequently transferred to [facility]. Upon arrival and acetaminophen level was repeated and was down to 36. Apparently was reported as much higher at the other facility. Patient reported he had been having significant pain in his ribs after fall 2-3 weeks ago apparently was diagnosed with rib fractures  Assessment/Plan:    Suicide attempt by drug ingestion/Insulin overdose/hypoglycemia No hypoglycemia. Stable for transfer to inpatient psych.  May not leave. Will sign IVC papers. 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 No IV pain meds. Oxycodone as inpatient for now, would not discharge on opiates due to recurrent suicide attempts    HTN  -was not taking meds at home -was supposed to be on Norvasc and Vasotec at home -renal function was normal so resumed above meds    Alcohol abuse/Tobacco abuse No clinical evidence of withdrawal at this time.  In fact he is nodding off.  D/C IV ativan. Change to PO    Situational depression -pt describes onset sx's after break up with girlfriend several months ago -Was on Klonopin and Wellbutrin prior to admission -Has not participated in either individual group psychotherapy   DVT prophylaxis: SCDs Code Status: Full Family Communication:  Patient Disposition Plan/Expected LOS: Transfer psych Isolation: Contact for MRSA PCR positive status  Consultants: Psychiatry  Procedures: None  Antibiotics: None  HPI/Subjective: Wants to go home.  Got back with girlfriend, "so everything is ok now. I just got to get home to get my life together"  Objective: Blood pressure 144/93, pulse 83, temperature 97.7 F (36.5 C), temperature source Oral, resp. rate 20, height 6' (1.829 m), weight 97.7 kg (215 lb 6.2 oz), SpO2 98.00%.  Intake/Output Summary (Last 24 hours) at 11/07/13 1114 Last data filed at 11/07/13 0900  Gross per 24 hour  Intake   3510 ml  Output      0 ml  Net   3510 ml   Exam: General: cooperative.  Awake, but nods off frequently Lungs: Clear to auscultation bilaterally without wheezes or crackles, RA Cardiovascular: Regular rate and rhythm without murmur gallop or rub normal  S1 and S2, no peripheral edema or JVD Abdomen: Nontender, nondistended, soft, bowel sounds positive, no rebound, no ascites, no appreciable mass Musculoskeletal: No significant cyanosis, clubbing of bilateral lower extremities Neurological: Alert and oriented x 3, moves all extremities x 4 without focal neurological deficits, CN 2-12  intact  Scheduled Meds:  Scheduled Meds: . amLODipine  10 mg Oral Daily  . carbamazepine  200 mg Oral BID  . Chlorhexidine Gluconate Cloth  6 each Topical Q0600  . enalapril  10 mg Oral Daily  . famotidine  40 mg Oral Daily  . folic acid  1 mg Oral Daily  . multivitamin with minerals  1 tablet Oral Daily  . mupirocin ointment  1 application Nasal BID  . nicotine  21 mg Transdermal Daily  . thiamine  100 mg Oral Daily   Data Reviewed: Basic Metabolic Panel:  Recent Labs Lab 11/05/13 0110 11/06/13 0347 11/07/13 0620  NA 135 134* 134*  K 3.6 3.5 4.1  CL 99 99 99  CO2 18* 23 23  GLUCOSE 41* 154* 97  BUN 3* 3* 3*  CREATININE 0.95 0.83 0.73  CALCIUM 9.6 8.4 8.7  MG 1.9  --   --   PHOS 1.1*  --  3.7   Liver Function Tests:  Recent Labs Lab 11/05/13 0110 11/06/13 0347 11/07/13 0620  AST 24 17 34  ALT 17 14 23   ALKPHOS 115 103 112  BILITOT 0.2* 0.1* 0.2*  PROT 7.4 6.5 7.0  ALBUMIN 4.0 3.4* 3.7   CBC:  Recent Labs Lab 11/05/13 0110  WBC 4.9  HGB 13.6  HCT 40.2  MCV 89.1  PLT 279   CBG:  Recent Labs Lab 11/06/13 1635 11/06/13 1951 11/07/13 0008 11/07/13 0411 11/07/13 0734  GLUCAP 117* 118* 148* 137* 99    Recent Results (from the past 240 hour(s))  MRSA PCR SCREENING     Status: Abnormal   Collection Time    11/05/13  7:32 AM      Result Value Range Status   MRSA by PCR POSITIVE (*) NEGATIVE Final   Comment:            The GeneXpert MRSA Assay (FDA     approved for NASAL specimens     only), is one component of a     comprehensive MRSA colonization     surveillance program. It is not     intended to diagnose MRSA     infection nor to guide or     monitor treatment for     MRSA infections.     RESULT CALLED TO, READ BACK BY AND VERIFIED WITH:     Patty Sermons RN 11:30 11/05/13 ([email or web address])    Crista Curb, M.D. [facility] [phone]   If 7PM-7AM, please contact night-coverage [email or web address] Password [email or web address] 11/07/2013, 11:14  AM

## 2013-11-07 NOTE — Clinical Social Work Note (Addendum)
2:43pm- Psych CSW confirmed with Magistrate [place] that IVC paperwork was received and that pt is in cue to be served.  Psych CSW faxed inpatient psychiatric treatment referral to [facility].  CSW awaiting a decision.  Vickii Penna, LCSWA [phone]  Clinical Social Work

## 2013-11-08 LAB — GLUCOSE, CAPILLARY: Glucose-Capillary: 93 mg/dL (ref 70–99)

## 2013-11-08 MED ORDER — CARBAMAZEPINE ER 200 MG PO TB12: 200.0000 mg | ORAL_TABLET | Freq: Two times a day (BID) | ORAL | Status: DC

## 2013-11-08 MED ORDER — MUPIROCIN 2 % EX OINT: 1.0000 "application " | TOPICAL_OINTMENT | Freq: Two times a day (BID) | CUTANEOUS | Status: DC

## 2013-11-08 MED ORDER — KETOROLAC TROMETHAMINE 15 MG/ML IJ SOLN
15.0000 mg | Freq: Four times a day (QID) | INTRAMUSCULAR | Status: DC | PRN
  Administered 2013-11-08: 15 mg via INTRAVENOUS
  Filled 2013-11-08: qty 1

## 2013-11-08 MED ORDER — IBUPROFEN 400 MG PO TABS: 400.0000 mg | ORAL_TABLET | Freq: Four times a day (QID) | ORAL | Status: DC | PRN

## 2013-11-08 MED ORDER — HYDRALAZINE HCL 20 MG/ML IJ SOLN: 10.0000 mg | Freq: Four times a day (QID) | INTRAMUSCULAR | Status: DC | PRN

## 2013-11-08 MED ORDER — OXYCODONE HCL 5 MG PO TABS
5.0000 mg | ORAL_TABLET | ORAL | Status: DC | PRN
  Administered 2013-11-08: 5 mg via ORAL
  Filled 2013-11-08: qty 1

## 2013-11-08 MED ORDER — OXYCODONE HCL 5 MG PO TABS
5.0000 mg | ORAL_TABLET | ORAL | Status: DC | PRN
  Administered 2013-11-08 – 2013-11-11 (×12): 10 mg via ORAL
  Filled 2013-11-08 (×12): qty 2

## 2013-11-08 MED ORDER — ADULT MULTIVITAMIN W/MINERALS CH: 1.0000 | ORAL_TABLET | Freq: Every day | ORAL | Status: DC

## 2013-11-08 NOTE — Clinical Social Work Psych Note (Addendum)
10:48am- updated RN.  RN will consult charge.  Psych CSW was notified by [facility] that pt will not be accepted to [facility] until isolation precautions have been cleared.  No private beds available.  Pt will also need consistent acceptable BP readings prior to being transferred to [facility].  MD aware.  Vickii Penna, LCSWA [phone]  Clinical Social Work

## 2013-11-08 NOTE — Discharge Summary (Deleted)
Physician Discharge Summary  [name] ZOX:[id number] DOB: 1976-10-10 DOA: 11/04/2013  PCP: No primary provider on file.  Admit date: 11/04/2013 Discharge date: 11/08/2013  Time spent:>35 minutes  Recommendations for Outpatient Follow-up:  Psychiatry hospital  F/u with PCP in 1-2 weeks   Discharge Diagnoses:  Active Problems:   Suicide attempt by drug ingestion   Insulin overdose   Alcohol abuse   Seizure disorder   Hypophosphatemia   Tobacco abuse   Situational depression   Rib pain   HTN (hypertension)   Hypoglycemia   Discharge Condition: stable   Diet recommendation: heart healthy   Filed Weights   11/04/13 2345 11/06/13 0500  Weight: 94.6 kg (208 lb 8.9 oz) 97.7 kg (215 lb 6.2 oz)    History of present illness:  Brief narrative:  37 year old male patient with known history of depression and hypertension. Presented to the emergency department with severe hypoglycemia. He intentionally injected 5 pens of aspart insulin (300 units each ) as well as took a whole bottle of NyQuil, several pills of Klonopin and drank 4 beers. After about 15-20 minutes he decided to call EMS. Upon arrival of EMS his blood sugar was noted to be 46 and he was given D50 and brought to the emergency department at [facility]. Per their records this was not his first suicide attempt in niece had multiple psychiatric hospitalizations. He was started on a DT and effusion but due to the lack of ICU beds was subsequently transferred to [facility]. Upon arrival and acetaminophen level was repeated and was down to 36. Apparently was reported as much higher at the other facility. Patient reported he had been having significant pain in his ribs after fall 2-3 weeks ago apparently was diagnosed with rib fractures   Hospital Course:  1. Suicide attempt by drug ingestion/Insulin overdose/hypoglycemia  No hypoglycemia. Stable for transfer to inpatient psych. May not leave. signed  IVC papers. Left message with SW  2. Seizure disorder  -has not been taking Tegretol as prescribed for at least 2 months-pt endorsed has had seizures without meds- states quit taking due to depression; resume meds  3. MRSA colonization  -not actively infected cont Bactroban local  -does not need contact isolation outside the acute care setting - OK to dc to psych setting with colonization  4. Hypophosphatemia corrected  5. Rib fractures  -pt says fell at home a few weeks ago- went to another ER and was told had rib fxs/requesting pain meds  -CXR here revealed multiple right rib non-displaced fractures  Continue NSAIDS. No IV pain meds. Oxycodone as inpatient for now, would not discharge on opiates due to recurrent suicide attempts  6. HTN  -was not taking meds at home  -was supposed to be on Norvasc and Vasotec at home  -renal function was normal so resumed above meds  7. Alcohol abuse/Tobacco abuse  No clinical evidence of withdrawal at this time. In fact he is nodding off. D/C IV ativan. Change to PO  8. Situational depression  -pt describes onset sx's after break up with girlfriend several months ago  -Was on Klonopin and Wellbutrin prior to admission  -Has not participated in either individual group psychotherapy     Procedures:  None  (i.e. Studies not automatically included, echos, thoracentesis, etc; not x-rays)  Consultations:  Psychiatry   Discharge Exam: Filed Vitals:   11/08/13 0608  BP: 108/69  Pulse: 67  Temp: 98.1 F (36.7 C)  Resp: 19  General: alert Cardiovascular: s1,s2 rrr Respiratory: CTA BL  Discharge Instructions  Discharge Orders   Future Orders Complete By Expires   Diet - low sodium heart healthy  As directed    Discharge instructions  As directed    Comments:     Follow up with primary care doctor in 1-2 weeks after discharge   Increase activity slowly  As directed        Medication List         amLODipine 10 MG tablet  Commonly  known as:  NORVASC  Take 10 mg by mouth daily.     buPROPion 150 MG 12 hr tablet  Commonly known as:  WELLBUTRIN SR  Take 150 mg by mouth 2 (two) times daily.     carbamazepine 200 MG 12 hr tablet  Commonly known as:  TEGRETOL XR  Take 1 tablet (200 mg total) by mouth 2 (two) times daily.     clonazePAM 2 MG tablet  Commonly known as:  KLONOPIN  Take 2 mg by mouth 3 (three) times daily as needed for anxiety.     enalapril 10 MG tablet  Commonly known as:  VASOTEC  Take 10 mg by mouth daily.     esomeprazole 40 MG capsule  Commonly known as:  NEXIUM  Take 40 mg by mouth daily at 12 noon.     ibuprofen 400 MG tablet  Commonly known as:  ADVIL,MOTRIN  Take 1 tablet (400 mg total) by mouth every 6 (six) hours as needed for mild pain.     multivitamin with minerals Tabs tablet  Take 1 tablet by mouth daily.     mupirocin ointment 2 %  Commonly known as:  BACTROBAN  Place 1 application into the nose 2 (two) times daily.       No Known Allergies     Follow-up Information   Follow up with [facility]     In 1 week.   Contact information:   [address] [place] [address] [phone]       The results of significant diagnostics from this hospitalization (including imaging, microbiology, ancillary and laboratory) are listed below for reference.    Significant Diagnostic Studies: Dg Ribs Bilateral W/chest  11/05/2013   CLINICAL DATA:  Chest wall pain status post fall  EXAM: BILATERAL RIBS AND CHEST - 4+ VIEW  COMPARISON:  None.  FINDINGS: Evaluation the ribs demonstrates multiple fractures involving the lateral aspect of the 5th through the 10th ribs. The fractures involving the 9th and 10th ribs appear to be mildly depressed. There is no evidence of a pneumothorax. Correlation with flail chest is recommended. No appreciable fractures identified involving the ribs on the left. Low lung volumes. There is no evidence of pneumothorax  or focal regions of consolidation or focal infiltrates.  IMPRESSION: Multiple rib fractures on the right as described above   Electronically Signed   By: Salome Holmes M.D.   On: 11/05/2013 14:17    Microbiology: Recent Results (from the past 240 hour(s))  MRSA PCR SCREENING     Status: Abnormal   Collection Time    11/05/13  7:32 AM      Result Value Range Status   MRSA by PCR POSITIVE (*) NEGATIVE Final   Comment:            The GeneXpert MRSA Assay (FDA     approved for NASAL specimens     only), is one component of a  comprehensive MRSA colonization     surveillance program. It is not     intended to diagnose MRSA     infection nor to guide or     monitor treatment for     MRSA infections.     RESULT CALLED TO, READ BACK BY AND VERIFIED WITH:     EViona Gilmore RN 11:30 11/05/13 ([email or web address])     Labs: Basic Metabolic Panel:  Recent Labs Lab 11/05/13 0110 11/06/13 0347 11/07/13 0620  NA 135 134* 134*  K 3.6 3.5 4.1  CL 99 99 99  CO2 18* 23 23  GLUCOSE 41* 154* 97  BUN 3* 3* 3*  CREATININE 0.95 0.83 0.73  CALCIUM 9.6 8.4 8.7  MG 1.9  --   --   PHOS 1.1*  --  3.7   Liver Function Tests:  Recent Labs Lab 11/05/13 0110 11/06/13 0347 11/07/13 0620  AST 24 17 34  ALT 17 14 23   ALKPHOS 115 103 112  BILITOT 0.2* 0.1* 0.2*  PROT 7.4 6.5 7.0  ALBUMIN 4.0 3.4* 3.7   No results found for this basename: LIPASE, AMYLASE,  in the last 168 hours No results found for this basename: AMMONIA,  in the last 168 hours CBC:  Recent Labs Lab 11/05/13 0110  WBC 4.9  HGB 13.6  HCT 40.2  MCV 89.1  PLT 279   Cardiac Enzymes: No results found for this basename: CKTOTAL, CKMB, CKMBINDEX, TROPONINI,  in the last 168 hours BNP: BNP (last 3 results) No results found for this basename: PROBNP,  in the last 8760 hours CBG:  Recent Labs Lab 11/06/13 1635 11/06/13 1951 11/07/13 0008 11/07/13 0411 11/07/13 0734  GLUCAP 117* 118* 148* 137* 99        Signed:  Firman Petrow N  [facility] 11/08/2013, 11:30 AM

## 2013-11-08 NOTE — Progress Notes (Signed)
[facility] PROGRESS NOTE  [name] ZOX:[id number] DOB: 1976-07-06 DOA: 11/04/2013 PCP: No primary provider on file.  Assessment/Plan: Brief narrative:  37 year old male patient with known history of depression and hypertension. Presented to the emergency department with severe hypoglycemia. He intentionally injected 5 pens of aspart insulin (300 units each ) as well as took a whole bottle of NyQuil, several pills of Klonopin and drank 4 beers. After about 15-20 minutes he decided to call EMS. Upon arrival of EMS his blood sugar was noted to be 46 and he was given D50 and brought to the emergency department at [facility]. Per their records this was not his first suicide attempt in niece had multiple psychiatric hospitalizations. He was started on a DT and effusion but due to the lack of ICU beds was subsequently transferred to [facility]. Upon arrival and acetaminophen level was repeated and was down to 36. Apparently was reported as much higher at the other facility. Patient reported he had been having significant pain in his ribs after fall 2-3 weeks ago apparently was diagnosed with rib fractures   Assessment/Plan:  1. Suicide attempt by drug ingestion/Insulin overdose/hypoglycemia  No hypoglycemia. Stable for transfer to inpatient psych. May not leave. signed IVC papers. Left message with SW  2. Seizure disorder  -has not been taking Tegretol as prescribed for at least 2 months-pt endorsed has had seizures without meds- states quit taking due to depression; resume meds  3. MRSA colonization  -not actively infected cont Bactroban local  -does not need contact isolation outside the acute care setting - OK to dc to psych setting with colonization  4. Hypophosphatemia corrected  5. Rib fractures  -pt says fell at home a few weeks ago- went to another ER and was told had rib fxs/requesting pain meds  -CXR here revealed multiple right rib non-displaced  fractures  Continue NSAIDS. No IV pain meds. Oxycodone as inpatient for now, would not discharge on opiates due to recurrent suicide attempts  6. HTN  -was not taking meds at home  -was supposed to be on Norvasc and Vasotec at home  -renal function was normal so resumed above meds  7. Alcohol abuse/Tobacco abuse  No clinical evidence of withdrawal at this time. In fact he is nodding off. D/C IV ativan. Change to PO  8. Situational depression  -pt describes onset sx's after break up with girlfriend several months ago  -Was on Klonopin and Wellbutrin prior to admission  -Has not participated in either individual group psychotherapy   Code Status: full Family Communication: d/w patient  (indicate person spoken with, relationship, and if by phone, the number) Disposition Plan: [facility]   Consultants:  Psychiatry   Procedures:  None   Antibiotics:  None  (indicate start date, and stop date if known)  HPI/Subjective: alert  Objective: Filed Vitals:   11/08/13 0608  BP: 108/69  Pulse: 67  Temp: 98.1 F (36.7 C)  Resp: 19    Intake/Output Summary (Last 24 hours) at 11/08/13 1124 Last data filed at 11/07/13 1408  Gross per 24 hour  Intake    840 ml  Output      0 ml  Net    840 ml   Filed Weights   11/04/13 2345 11/06/13 0500  Weight: 94.6 kg (208 lb 8.9 oz) 97.7 kg (215 lb 6.2 oz)    Exam:   General:  alert  Cardiovascular: s1,s2 rrr  Respiratory: CTA BL  Abdomen: soft, nt, nd  Musculoskeletal: no LE edema   Data Reviewed: Basic Metabolic Panel:  Recent Labs Lab 11/05/13 0110 11/06/13 0347 11/07/13 0620  NA 135 134* 134*  K 3.6 3.5 4.1  CL 99 99 99  CO2 18* 23 23  GLUCOSE 41* 154* 97  BUN 3* 3* 3*  CREATININE 0.95 0.83 0.73  CALCIUM 9.6 8.4 8.7  MG 1.9  --   --   PHOS 1.1*  --  3.7   Liver Function Tests:  Recent Labs Lab 11/05/13 0110 11/06/13 0347 11/07/13 0620  AST 24 17 34  ALT 17 14 23   ALKPHOS 115 103 112  BILITOT 0.2* 0.1*  0.2*  PROT 7.4 6.5 7.0  ALBUMIN 4.0 3.4* 3.7   No results found for this basename: LIPASE, AMYLASE,  in the last 168 hours No results found for this basename: AMMONIA,  in the last 168 hours CBC:  Recent Labs Lab 11/05/13 0110  WBC 4.9  HGB 13.6  HCT 40.2  MCV 89.1  PLT 279   Cardiac Enzymes: No results found for this basename: CKTOTAL, CKMB, CKMBINDEX, TROPONINI,  in the last 168 hours BNP (last 3 results) No results found for this basename: PROBNP,  in the last 8760 hours CBG:  Recent Labs Lab 11/06/13 1635 11/06/13 1951 11/07/13 0008 11/07/13 0411 11/07/13 0734  GLUCAP 117* 118* 148* 137* 99    Recent Results (from the past 240 hour(s))  MRSA PCR SCREENING     Status: Abnormal   Collection Time    11/05/13  7:32 AM      Result Value Range Status   MRSA by PCR POSITIVE (*) NEGATIVE Final   Comment:            The GeneXpert MRSA Assay (FDA     approved for NASAL specimens     only), is one component of a     comprehensive MRSA colonization     surveillance program. It is not     intended to diagnose MRSA     infection nor to guide or     monitor treatment for     MRSA infections.     RESULT CALLED TO, READ BACK BY AND VERIFIED WITH:     Patty Sermons RN 11:30 11/05/13 ([email or web address])     Studies: No results found.  Scheduled Meds: . amLODipine  10 mg Oral Daily  . carbamazepine  200 mg Oral BID  . Chlorhexidine Gluconate Cloth  6 each Topical Q0600  . enalapril  10 mg Oral Daily  . famotidine  40 mg Oral Daily  . folic acid  1 mg Oral Daily  . multivitamin with minerals  1 tablet Oral Daily  . mupirocin ointment  1 application Nasal BID  . nicotine  21 mg Transdermal Daily  . thiamine  100 mg Oral Daily   Continuous Infusions:   Active Problems:   Suicide attempt by drug ingestion   Insulin overdose   Alcohol abuse   Seizure disorder   Hypophosphatemia   Tobacco abuse   Situational depression   Rib pain   HTN (hypertension)    Hypoglycemia    Time spent: >35 minutes     [name]  [facility] Pager [phone]. If 7PM-7AM, please contact night-coverage at [email or web address], password [facility] 11/08/2013, 11:24 AM  LOS: 4 days

## 2013-11-09 DIAGNOSIS — I10 Essential (primary) hypertension: Secondary | ICD-10-CM

## 2013-11-09 DIAGNOSIS — E162 Hypoglycemia, unspecified: Secondary | ICD-10-CM

## 2013-11-09 MED ORDER — OXYCODONE HCL 5 MG PO TABS: 5.0000 mg | ORAL_TABLET | ORAL | Status: DC | PRN

## 2013-11-09 MED ORDER — DOCUSATE SODIUM 100 MG PO CAPS
100.0000 mg | ORAL_CAPSULE | Freq: Two times a day (BID) | ORAL | Status: DC
  Administered 2013-11-09 – 2013-11-10 (×4): 100 mg via ORAL
  Filled 2013-11-09 (×5): qty 1

## 2013-11-09 MED ORDER — SENNA 8.6 MG PO TABS: 1.0000 | ORAL_TABLET | Freq: Every day | ORAL | Status: DC | PRN

## 2013-11-09 NOTE — Progress Notes (Signed)
Contacted wknd SW [place] Drinkard regarding pt's disposition.  Information was given regarding pt's ACT team SW through [facility] Frederico Hamman), asked if possible to get in contact with her to inquire about possible plan once d/c'd before determining whether or not inpatient treatment is needed.  Tomi Bamberger, MHT

## 2013-11-09 NOTE — Progress Notes (Signed)
[facility] PROGRESS NOTE  [name] ZOX:[id number] DOB: [birth year] DOA: 11/04/2013 PCP: No primary provider on file.  Assessment/Plan: Brief narrative:  37 year old male patient with known history of depression and hypertension. Presented to the emergency department with severe hypoglycemia. He intentionally injected 5 pens of aspart insulin (300 units each ) as well as took a whole bottle of NyQuil, several pills of Klonopin and drank 4 beers. After about 15-20 minutes he decided to call EMS. Upon arrival of EMS his blood sugar was noted to be 46 and he was given D50 and brought to the emergency department at [facility]. Per their records this was not his first suicide attempt in niece had multiple psychiatric hospitalizations. He was started on a DT and effusion but due to the lack of ICU beds was subsequently transferred to [facility]. Upon arrival and acetaminophen level was repeated and was down to 36. Apparently was reported as much higher at the other facility. Patient reported he had been having significant pain in his ribs after fall 2-3 weeks ago apparently was diagnosed with rib fractures   Assessment/Plan:  1. Suicide attempt by drug ingestion/Insulin overdose/hypoglycemia  No hypoglycemia. Stable for transfer to inpatient psych. May not leave. signed IVC papers. Left message with SW  2. Seizure disorder  -has not been taking Tegretol as prescribed for at least 2 months-pt endorsed has had seizures without meds- states quit taking due to depression; resume meds  3. MRSA colonization  -not actively infected cont Bactroban local  -does not need contact isolation outside the acute care setting - OK to dc to psych setting with colonization  4. Hypophosphatemia corrected  5. Rib fractures  -pt says fell at home a few weeks ago- went to another ER and was told had rib fxs/requesting pain meds  -CXR here revealed multiple right rib non-displaced  fractures cont pain control 6. HTN  -was not taking meds at home  -was supposed to be on Norvasc and Vasotec at home  -renal function was normal so resumed above meds  7. Alcohol abuse/Tobacco abuse  No clinical evidence of withdrawal at this time. In fact he is nodding off. D/C IV ativan. Change to PO  8. Situational depression  -pt describes onset sx's after break up with girlfriend several months ago  -Was on Klonopin and Wellbutrin prior to admission  -Has not participated in either individual group psychotherapy  Awaiting [facility] d/c;   Code Status: full Family Communication: d/w patient  (indicate person spoken with, relationship, and if by phone, the number) Disposition Plan: [facility]   Consultants:  Psychiatry   Procedures:  None   Antibiotics:  None  (indicate start date, and stop date if known)  HPI/Subjective: alert  Objective: Filed Vitals:   11/09/13 0627  BP: 131/81  Pulse: 69  Temp: 97.8 F (36.6 C)  Resp: 20    Intake/Output Summary (Last 24 hours) at 11/09/13 1114 Last data filed at 11/08/13 2142  Gross per 24 hour  Intake    480 ml  Output      0 ml  Net    480 ml   Filed Weights   11/04/13 2345 11/06/13 0500  Weight: 94.6 kg (208 lb 8.9 oz) 97.7 kg (215 lb 6.2 oz)    Exam:   General:  alert  Cardiovascular: s1,s2 rrr  Respiratory: CTA BL  Abdomen: soft, nt, nd   Musculoskeletal: no LE edema   Data Reviewed: Basic Metabolic Panel:  Recent Labs  Lab 11/05/13 0110 11/06/13 0347 11/07/13 0620  NA 135 134* 134*  K 3.6 3.5 4.1  CL 99 99 99  CO2 18* 23 23  GLUCOSE 41* 154* 97  BUN 3* 3* 3*  CREATININE 0.95 0.83 0.73  CALCIUM 9.6 8.4 8.7  MG 1.9  --   --   PHOS 1.1*  --  3.7   Liver Function Tests:  Recent Labs Lab 11/05/13 0110 11/06/13 0347 11/07/13 0620  AST 24 17 34  ALT 17 14 23   ALKPHOS 115 103 112  BILITOT 0.2* 0.1* 0.2*  PROT 7.4 6.5 7.0  ALBUMIN 4.0 3.4* 3.7   No results found for this basename: LIPASE,  AMYLASE,  in the last 168 hours No results found for this basename: AMMONIA,  in the last 168 hours CBC:  Recent Labs Lab 11/05/13 0110  WBC 4.9  HGB 13.6  HCT 40.2  MCV 89.1  PLT 279   Cardiac Enzymes: No results found for this basename: CKTOTAL, CKMB, CKMBINDEX, TROPONINI,  in the last 168 hours BNP (last 3 results) No results found for this basename: PROBNP,  in the last 8760 hours CBG:  Recent Labs Lab 11/06/13 1951 11/07/13 0008 11/07/13 0411 11/07/13 0734 11/08/13 1709  GLUCAP 118* 148* 137* 99 93    Recent Results (from the past 240 hour(s))  MRSA PCR SCREENING     Status: Abnormal   Collection Time    11/05/13  7:32 AM      Result Value Range Status   MRSA by PCR POSITIVE (*) NEGATIVE Final   Comment:            The GeneXpert MRSA Assay (FDA     approved for NASAL specimens     only), is one component of a     comprehensive MRSA colonization     surveillance program. It is not     intended to diagnose MRSA     infection nor to guide or     monitor treatment for     MRSA infections.     RESULT CALLED TO, READ BACK BY AND VERIFIED WITH:     Patty Sermons RN 11:30 11/05/13 ([email or web address])     Studies: No results found.  Scheduled Meds: . amLODipine  10 mg Oral Daily  . carbamazepine  200 mg Oral BID  . Chlorhexidine Gluconate Cloth  6 each Topical Q0600  . enalapril  10 mg Oral Daily  . famotidine  40 mg Oral Daily  . folic acid  1 mg Oral Daily  . multivitamin with minerals  1 tablet Oral Daily  . mupirocin ointment  1 application Nasal BID  . nicotine  21 mg Transdermal Daily  . thiamine  100 mg Oral Daily   Continuous Infusions:   Active Problems:   Suicide attempt by drug ingestion   Insulin overdose   Alcohol abuse   Seizure disorder   Hypophosphatemia   Tobacco abuse   Situational depression   Rib pain   HTN (hypertension)   Hypoglycemia    Time spent: >35 minutes     [name]  [facility] Pager [phone]. If  7PM-7AM, please contact night-coverage at [email or web address], password [facility] 11/09/2013, 11:14 AM  LOS: 5 days

## 2013-11-09 NOTE — Progress Notes (Signed)
Weekend CSW contacted RN to confirm that contact precautions had been lifted. Weekend CSW then spoke with Tomi Bamberger, MHT from [facility] regarding patient's admission status. Per MHT, [facility] will review patient's status today 11/10/13 to determine possible acceptance due to patient acuity. MHT Tomi Bamberger to contact Weekend CSW once decision has been made. Weekend CSW to continue following patient for discharge needs over weekend.  Samuella Bruin, MSW, LCSWA Clinical Social Worker [facility] Emergency Dept. [phone]

## 2013-11-10 NOTE — Progress Notes (Signed)
Weekend CSW attempted to contact ACT team through [facility] [phone]. Office is not open until Monday 11/11/13. CSW left voicemail for Psychiatrist & Therapist. Mosie Epstein CSW to follow up with agency regarding possible care plan for patient at discharge.  Samuella Bruin, MSW, LCSWA Clinical Social Worker [facility] Emergency Dept. [phone]

## 2013-11-10 NOTE — Progress Notes (Signed)
[facility] PROGRESS NOTE  RUFORD DUDZINSKI JXB:[id number] DOB: 1976/07/25 DOA: 11/04/2013 PCP: No primary provider on file.  Assessment/Plan: Brief narrative:  37 year old male patient with known history of depression and hypertension. Presented to the emergency department with severe hypoglycemia. He intentionally injected 5 pens of aspart insulin (300 units each ) as well as took a whole bottle of NyQuil, several pills of Klonopin and drank 4 beers. After about 15-20 minutes he decided to call EMS. Upon arrival of EMS his blood sugar was noted to be 46 and he was given D50 and brought to the emergency department at [facility]. Per their records this was not his first suicide attempt in niece had multiple psychiatric hospitalizations. He was started on a DT and effusion but due to the lack of ICU beds was subsequently transferred to [facility]. Upon arrival and acetaminophen level was repeated and was down to 36. Apparently was reported as much higher at the other facility. Patient reported he had been having significant pain in his ribs after fall 2-3 weeks ago apparently was diagnosed with rib fractures   Assessment/Plan:  1. Suicide attempt by drug ingestion/Insulin overdose/hypoglycemia  No hypoglycemia. Stable for transfer to inpatient psych. May not leave. signed IVC papers. Left message with SW  2. Seizure disorder  -has not been taking Tegretol as prescribed for at least 2 months-pt endorsed has had seizures without meds- states quit taking due to depression; resume meds  3. MRSA colonization  -not actively infected cont Bactroban local  -does not need contact isolation outside the acute care setting - OK to dc to psych setting with colonization  4. Hypophosphatemia corrected  5. Rib fractures  -pt says fell at home a few weeks ago- went to another ER and was told had rib fxs/requesting pain meds  -CXR here revealed multiple right rib non-displaced  fractures cont pain control 6. HTN  -was not taking meds at home  -was supposed to be on Norvasc and Vasotec at home  -renal function was normal so resumed above meds  7. Alcohol abuse/Tobacco abuse  No clinical evidence of withdrawal at this time. In fact he is nodding off. D/C IV ativan. Change to PO  8. Situational depression  -pt describes onset sx's after break up with girlfriend several months ago  -Was on Klonopin and Wellbutrin prior to admission  -Has not participated in either individual group psychotherapy  Awaiting [facility] d/c;   Code Status: full Family Communication: d/w patient  (indicate person spoken with, relationship, and if by phone, the number) Disposition Plan: [facility]   Consultants:  Psychiatry   Procedures:  None   Antibiotics:  None  (indicate start date, and stop date if known)  HPI/Subjective: alert  Objective: Filed Vitals:   11/10/13 1105  BP: 122/76  Pulse:   Temp:   Resp:     Intake/Output Summary (Last 24 hours) at 11/10/13 1142 Last data filed at 11/09/13 1300  Gross per 24 hour  Intake    240 ml  Output      0 ml  Net    240 ml   Filed Weights   11/04/13 2345 11/06/13 0500  Weight: 94.6 kg (208 lb 8.9 oz) 97.7 kg (215 lb 6.2 oz)    Exam:   General:  alert  Cardiovascular: s1,s2 rrr  Respiratory: CTA BL  Abdomen: soft, nt, nd   Musculoskeletal: no LE edema   Data Reviewed: Basic Metabolic Panel:  Recent Labs Lab 11/05/13 0110  11/06/13 0347 11/07/13 0620  NA 135 134* 134*  K 3.6 3.5 4.1  CL 99 99 99  CO2 18* 23 23  GLUCOSE 41* 154* 97  BUN 3* 3* 3*  CREATININE 0.95 0.83 0.73  CALCIUM 9.6 8.4 8.7  MG 1.9  --   --   PHOS 1.1*  --  3.7   Liver Function Tests:  Recent Labs Lab 11/05/13 0110 11/06/13 0347 11/07/13 0620  AST 24 17 34  ALT 17 14 23   ALKPHOS 115 103 112  BILITOT 0.2* 0.1* 0.2*  PROT 7.4 6.5 7.0  ALBUMIN 4.0 3.4* 3.7   No results found for this basename: LIPASE, AMYLASE,  in the last  168 hours No results found for this basename: AMMONIA,  in the last 168 hours CBC:  Recent Labs Lab 11/05/13 0110  WBC 4.9  HGB 13.6  HCT 40.2  MCV 89.1  PLT 279   Cardiac Enzymes: No results found for this basename: CKTOTAL, CKMB, CKMBINDEX, TROPONINI,  in the last 168 hours BNP (last 3 results) No results found for this basename: PROBNP,  in the last 8760 hours CBG:  Recent Labs Lab 11/06/13 1951 11/07/13 0008 11/07/13 0411 11/07/13 0734 11/08/13 1709  GLUCAP 118* 148* 137* 99 93    Recent Results (from the past 240 hour(s))  MRSA PCR SCREENING     Status: Abnormal   Collection Time    11/05/13  7:32 AM      Result Value Range Status   MRSA by PCR POSITIVE (*) NEGATIVE Final   Comment:            The GeneXpert MRSA Assay (FDA     approved for NASAL specimens     only), is one component of a     comprehensive MRSA colonization     surveillance program. It is not     intended to diagnose MRSA     infection nor to guide or     monitor treatment for     MRSA infections.     RESULT CALLED TO, READ BACK BY AND VERIFIED WITH:     Patty Sermons RN 11:30 11/05/13 ([email or web address])     Studies: No results found.  Scheduled Meds: . amLODipine  10 mg Oral Daily  . carbamazepine  200 mg Oral BID  . docusate sodium  100 mg Oral BID  . enalapril  10 mg Oral Daily  . famotidine  40 mg Oral Daily  . folic acid  1 mg Oral Daily  . multivitamin with minerals  1 tablet Oral Daily  . nicotine  21 mg Transdermal Daily  . thiamine  100 mg Oral Daily   Continuous Infusions:   Active Problems:   Suicide attempt by drug ingestion   Insulin overdose   Alcohol abuse   Seizure disorder   Hypophosphatemia   Tobacco abuse   Situational depression   Rib pain   HTN (hypertension)   Hypoglycemia    Time spent: >35 minutes     [name]  [facility] Pager [phone]. If 7PM-7AM, please contact night-coverage at [email or web address], password [facility] 11/10/2013, 11:42 AM   LOS: 6 days

## 2013-11-11 DIAGNOSIS — F4321 Adjustment disorder with depressed mood: Secondary | ICD-10-CM

## 2013-11-11 MED ORDER — CARBAMAZEPINE ER 200 MG PO TB12: 200.0000 mg | ORAL_TABLET | Freq: Two times a day (BID) | ORAL | Status: DC

## 2013-11-11 MED ORDER — ENALAPRIL MALEATE 10 MG PO TABS: 10.0000 mg | ORAL_TABLET | Freq: Every day | ORAL | Status: DC

## 2013-11-11 MED ORDER — BUPROPION HCL ER (SR) 150 MG PO TB12: 150.0000 mg | ORAL_TABLET | Freq: Two times a day (BID) | ORAL | Status: DC

## 2013-11-11 MED ORDER — AMLODIPINE BESYLATE 10 MG PO TABS: 10.0000 mg | ORAL_TABLET | Freq: Every day | ORAL | Status: DC

## 2013-11-11 NOTE — Progress Notes (Signed)
[name], MD Physician Signed Internal Medicine Progress Notes Service date: 11/10/2013 11:42 AM  [facility] PROGRESS NOTE   [name] BJY:[id number] DOB: 1975-12-18 DOA: 11/04/2013 PCP: No primary provider on file.   Assessment/Plan: Brief narrative:   37 year old male patient with known history of depression and hypertension. Presented to the emergency department with severe hypoglycemia. He intentionally injected 5 pens of aspart insulin (300 units each ) as well as took a whole bottle of NyQuil, several pills of Klonopin and drank 4 beers. After about 15-20 minutes he decided to call EMS. Upon arrival of EMS his blood sugar was noted to be 46 and he was given D50 and brought to the emergency department at [facility]. Per their records this was not his first suicide attempt in niece had multiple psychiatric hospitalizations. He was started on a DT and effusion but due to the lack of ICU beds was subsequently transferred to [facility]. Upon arrival and acetaminophen level was repeated and was down to 36. Apparently was reported as much higher at the other facility. Patient reported he had been having significant pain in his ribs after fall 2-3 weeks ago apparently was diagnosed with rib fractures    Assessment/Plan:   1. Suicide attempt by drug ingestion/Insulin overdose/hypoglycemia   No hypoglycemia. Stable for transfer to inpatient psych. May not leave. signed IVC papers. Left message with SW   2. Seizure disorder   -has not been taking Tegretol as prescribed for at least 2 months-pt endorsed has had seizures without meds- states quit taking due to depression; resume meds   3. MRSA colonization   -not actively infected cont Bactroban local   -does not need contact isolation outside the acute care setting - OK to dc to psych setting with colonization   4. Hypophosphatemia corrected   5. Rib fractures   -pt says fell at home a few weeks ago-  went to another ER and was told had rib fxs/requesting pain meds   -CXR here revealed multiple right rib non-displaced fractures cont pain control 6. HTN  -was not taking meds at home   -was supposed to be on Norvasc and Vasotec at home   -renal function was normal so resumed above meds   7. Alcohol abuse/Tobacco abuse   No clinical evidence of withdrawal at this time. In fact he is nodding off. D/C IV ativan. Change to PO   8. Situational depression   -pt describes onset sx's after break up with girlfriend several months ago   -Was on Klonopin and Wellbutrin prior to admission   -Has not participated in either individual group psychotherapy   Awaiting [facility] d/c;    Code Status: full Family Communication: d/w patient  (indicate person spoken with, relationship, and if by phone, the number) Disposition Plan: [facility]     Consultants: Psychiatry    Procedures: None    Antibiotics: None  (indicate start date, and stop date if known)   HPI/Subjective: alert  -------------------------------------------------------------------------------- Objective: Filed Vitals:     11/10/13 1105   BP:  122/76   Pulse:     Temp:     Resp:      Intake/Output Summary (Last 24 hours) at 11/10/13 1142 Last data filed at 11/09/13 1300   Gross per 24 hour   Intake     240 ml   Output       0 ml   Net     240 ml    Ceasar Mons  Weights     11/04/13 2345  11/06/13 0500   Weight:  94.6 kg (208 lb 8.9 oz)  97.7 kg (215 lb 6.2 oz)     -------------------------------------------------------------------------------- Exam:    General:  alert Cardiovascular: s1,s2 rrr Respiratory: CTA BL Abdomen: soft, nt, nd   Musculoskeletal: no LE edema    Data Reviewed: Basic Metabolic Panel: Recent Labs Lab  11/05/13 0110  11/06/13 0347  11/07/13 0620   NA  135  134*  134*   K  3.6  3.5  4.1   CL  99  99  99   CO2  18*  23  23   GLUCOSE  41*  154*  97   BUN  3*  3*  3*   CREATININE  0.95  0.83   0.73   CALCIUM  9.6  8.4  8.7   MG  1.9   --    --    PHOS  1.1*   --   3.7    Liver Function Tests: Recent Labs Lab  11/05/13 0110  11/06/13 0347  11/07/13 0620   AST  24  17  34   ALT  17  14  23    ALKPHOS  115  103  112   BILITOT  0.2*  0.1*  0.2*   PROT  7.4  6.5  7.0   ALBUMIN  4.0  3.4*  3.7    No results found for this basename: LIPASE, AMYLASE,  in the last 168 hours No results found for this basename: AMMONIA,  in the last 168 hours CBC: Recent Labs Lab  11/05/13 0110   WBC  4.9   HGB  13.6   HCT  40.2   MCV  89.1   PLT  279    Cardiac Enzymes: No results found for this basename: CKTOTAL, CKMB, CKMBINDEX, TROPONINI,  in the last 168 hours BNP (last 3 results) No results found for this basename: PROBNP,  in the last 8760 hours CBG: Recent Labs Lab  11/06/13 1951  11/07/13 0008  11/07/13 0411  11/07/13 0734  11/08/13 1709   GLUCAP  118*  148*  137*  99  93       Recent Results (from the past 240 hour(s))   MRSA PCR SCREENING     Status: Abnormal     Collection Time      11/05/13  7:32 AM       Result  Value  Range  Status     MRSA by PCR  POSITIVE (*)  NEGATIVE  Final     Comment:                The GeneXpert MRSA Assay (FDA        approved for NASAL specimens        only), is one component of a        comprehensive MRSA colonization        surveillance program. It is not        intended to diagnose MRSA        infection nor to guide or        monitor treatment for        MRSA infections.        RESULT CALLED TO, READ BACK BY AND VERIFIED WITH:        Patty Sermons RN 11:30 11/05/13 ([email or web address])        Studies: No results found.   Scheduled Meds: .  amLODipine   10 mg  Oral  Daily   .  carbamazepine   200 mg  Oral  BID   .  docusate sodium   100 mg  Oral  BID   .  enalapril   10 mg  Oral  Daily   .  famotidine   40 mg  Oral  Daily   .  folic acid   1 mg  Oral  Daily   .  multivitamin with minerals   1 tablet  Oral  Daily   .  nicotine    21 mg  Transdermal  Daily   .  thiamine   100 mg  Oral  Daily    Continuous Infusions:    Active Problems:   Suicide attempt by drug ingestion   Insulin overdose   Alcohol abuse   Seizure disorder   Hypophosphatemia   Tobacco abuse   Situational depression   Rib pain   HTN (hypertension)   Hypoglycemia     Time spent: >35 minutes        [name]             [facility] Pager [phone]. If 7PM-7AM, please contact night-coverage at [email or web address], password [email or web address]   --------------------------------------------------------------------------------

## 2013-11-11 NOTE — Consult Note (Signed)
Reason for Consult: Depression and suicide attempt Referring Physician: [name] is an 37 y.o. male.  HPI: Patient is seen and chart reviewed. Case discussed with [name], MD and his ACT team in [place]. Patient endorses eight suicidal attempts this year and been in [facility] for a week, he feels better, denied current symptoms of depression and suicidal ideation, intention or plans. He feels regrets about his previous attempts. He has his father stay close by mobile home and he has pets to care for. He denies psychosis, homicidal ideation and denies craving for alcohol. He is know to his ACT team with history of bipolar disorder, alcohol abuse and non compliance with medication management and multiple suicidal attempt and also seem to be attention seeking behaviors.   Mental Status Examination: Patient has been calm and cooperative. He has normal psychomotor activity. He has unshaven beard and dressed with hospital gown. He has fair eye contact. He has fine mood and his affect was appropriate with his current mood. He has normal rate, rhythm, and low volume of speech. His thought process is linear and goal directed. Patient denies current suicidal, homicidal ideations, intentions or plans. Patient has no evidence of auditory or visual hallucinations, delusions, and paranoia. Patient has fair insight judgment and impulse control.  Past Medical History  Diagnosis Date  . Depression   . Anxiety   . Hypertension   . GERD (gastroesophageal reflux disease)     History reviewed. No pertinent past surgical history.  Family History  Problem Relation Age of Onset  . CAD Mother   . Diabetes Mellitus II Father     Social History:  reports that he has been smoking.  He does not have any smokeless tobacco history on file. He reports that he drinks alcohol. He reports that he does not use illicit drugs.  Allergies: No Known Allergies  Medications: I have reviewed  the patient's current medications.  No results found for this or any previous visit (from the past 48 hour(s)).  No results found.  Positive for anxiety, bad mood, behavior problems, bipolar, depression, excessive alcohol consumption, learning difficulty, mood swings and sleep disturbance Blood pressure 124/82, pulse 72, temperature 99.1 F (37.3 C), temperature source Axillary, resp. rate 16, height 6' (1.829 m), weight 97.7 kg (215 lb 6.2 oz), SpO2 97.00%.   Assessment/Plan: Bipolar disorder, MRE is depression Non Compliance with medication management Status post suicide attempt with overdose  Recommendation: 1. Case discussed with [name], MD and his ACT team, therapist and Psychiatrist Dr. Omelia Blackwater from [facility]  2. Patient does not meet criteria of acute psychiatric hospitalization as he has been contracting for safety at this time.   3. Will recommend to rescind his IVC and discharge to home with his ACT team services  4. Agree with plan of patient ACT team psychiatrist Dr. Omelia Blackwater, [facility] if he continue to make threats to himself or does further suicidal attempt with his history of multiple suicidal attempt and extensive psych hospitalization about eight time this year.   5. Appreciate psych hospitalization and will sign off at this time.   Mailynn Everly,JANARDHAHA R. 11/11/2013, 3:37 PM

## 2013-11-11 NOTE — Progress Notes (Signed)
IVC paperwork has been rescinded and faxed to magistrate.  Natahlia Hoggard, LCSWA [phone]

## 2013-11-11 NOTE — Discharge Summary (Signed)
Physician Discharge Summary  [name]:[id number] DOB: 1976/06/30 DOA: 11/04/2013  PCP: No primary provider on file.  Admit date: 11/04/2013 Discharge date: 11/11/2013  Time spent: >35 minutes  Recommendations for Outpatient Follow-up:  ACT f/u this week F/u with PCP 3-4 days  Discharge Diagnoses:  Active Problems:   Suicide attempt by drug ingestion   Insulin overdose   Alcohol abuse   Seizure disorder   Hypophosphatemia   Tobacco abuse   Situational depression   Rib pain   HTN (hypertension)   Hypoglycemia   Discharge Condition: stable  Diet recommendation: heart healthy  Filed Weights   11/04/13 2345 11/06/13 0500  Weight: 94.6 kg (208 lb 8.9 oz) 97.7 kg (215 lb 6.2 oz)    History of present illness:  37 year old male patient with known history of depression and hypertension. Presented to the emergency department with severe hypoglycemia. He intentionally injected 5 pens of aspart insulin (300 units each ) as well as took a whole bottle of NyQuil, several pills of Klonopin and drank 4 beers. After about 15-20 minutes he decided to call EMS. Upon arrival of EMS his blood sugar was noted to be 46 and he was given D50 and brought to the emergency department at [facility]. Per their records this was not his first suicide attempt in niece had multiple psychiatric hospitalizations. He was started on a DT and effusion but due to the lack of ICU beds was subsequently transferred to [facility]. Upon arrival and acetaminophen level was repeated and was down to 36. Apparently was reported as much higher at the other facility. Patient reported he had been having significant pain in his ribs after fall 2-3 weeks ago apparently was diagnosed with rib fractures   Hospital Course:  1. Suicide attempt by drug ingestion/Insulin overdose/hypoglycemia No hypoglycemia/resolved  -d/w psychiatry: initial plan was to admit to [facility]; patient stayed in [place] over  the weekend, evaluated by psychiatry who recommended to d/c home and outpatient follow up/ACT; patient is no longer actively suicidal;  2. Seizure disorder  -has not been taking Tegretol as prescribed for at least 2 months-pt endorsed has had seizures without meds  -resume antiepileptics  3. Rib fractures  -pt says fell at home a few weeks ago- went to another ER and was told had rib fxs/requesting pain meds  -CXR here revealed multiple right rib non-displaced fractures cont pain control  4. HTN ; Patient was not taking meds at home  -was supposed to be on Norvasc and Vasotec at home  -renal function was normal so resumed above meds  5. Situational depression  -pt describes onset sx's after break up with girlfriend several months ago  -resume Wellbutrin   Initial plan was to admit [facility], but per psychiatry patient is no loner actively suicidal, and cleared to go home, with outpatient follow up;       Procedures:  None  (i.e. Studies not automatically included, echos, thoracentesis, etc; not x-rays)  Consultations:  Psychiatry   Discharge Exam: Filed Vitals:   11/11/13 1339  BP: 124/82  Pulse: 72  Temp: 99.1 F (37.3 C)  Resp: 16    General: alert Cardiovascular: s1,s2 rrr Respiratory: CTA BL  Discharge Instructions  Discharge Orders   Future Orders Complete By Expires   Diet - low sodium heart healthy  As directed    Diet - low sodium heart healthy  As directed    Discharge instructions  As directed    Comments:  Follow up with primary care doctor in 1-2 weeks after discharge   Discharge instructions  As directed    Comments:     Please follow up with outpatient psychiatry this week   Increase activity slowly  As directed    Increase activity slowly  As directed        Medication List    STOP taking these medications       clonazePAM 2 MG tablet  Commonly known as:  KLONOPIN      TAKE these medications       amLODipine 10 MG tablet  Commonly  known as:  NORVASC  Take 1 tablet (10 mg total) by mouth daily.     buPROPion 150 MG 12 hr tablet  Commonly known as:  WELLBUTRIN SR  Take 1 tablet (150 mg total) by mouth 2 (two) times daily.     carbamazepine 200 MG 12 hr tablet  Commonly known as:  TEGRETOL XR  Take 1 tablet (200 mg total) by mouth 2 (two) times daily.     enalapril 10 MG tablet  Commonly known as:  VASOTEC  Take 1 tablet (10 mg total) by mouth daily.     esomeprazole 40 MG capsule  Commonly known as:  NEXIUM  Take 40 mg by mouth daily at 12 noon.     ibuprofen 400 MG tablet  Commonly known as:  ADVIL,MOTRIN  Take 1 tablet (400 mg total) by mouth every 6 (six) hours as needed for mild pain.     multivitamin with minerals Tabs tablet  Take 1 tablet by mouth daily.     mupirocin ointment 2 %  Commonly known as:  BACTROBAN  Place 1 application into the nose 2 (two) times daily.       No Known Allergies     Follow-up Information   Follow up with [facility]     In 1 week.   Contact information:   [address] [place] [address] [phone]       The results of significant diagnostics from this hospitalization (including imaging, microbiology, ancillary and laboratory) are listed below for reference.    Significant Diagnostic Studies: Dg Ribs Bilateral W/chest  11/05/2013   CLINICAL DATA:  Chest wall pain status post fall  EXAM: BILATERAL RIBS AND CHEST - 4+ VIEW  COMPARISON:  None.  FINDINGS: Evaluation the ribs demonstrates multiple fractures involving the lateral aspect of the 5th through the 10th ribs. The fractures involving the 9th and 10th ribs appear to be mildly depressed. There is no evidence of a pneumothorax. Correlation with flail chest is recommended. No appreciable fractures identified involving the ribs on the left. Low lung volumes. There is no evidence of pneumothorax or focal regions of consolidation or focal infiltrates.  IMPRESSION:  Multiple rib fractures on the right as described above   Electronically Signed   By: Salome Holmes M.D.   On: 11/05/2013 14:17    Microbiology: Recent Results (from the past 240 hour(s))  MRSA PCR SCREENING     Status: Abnormal   Collection Time    11/05/13  7:32 AM      Result Value Range Status   MRSA by PCR POSITIVE (*) NEGATIVE Final   Comment:            The GeneXpert MRSA Assay (FDA     approved for NASAL specimens     only), is one component of a     comprehensive MRSA  colonization     surveillance program. It is not     intended to diagnose MRSA     infection nor to guide or     monitor treatment for     MRSA infections.     RESULT CALLED TO, READ BACK BY AND VERIFIED WITH:     EViona Gilmore RN 11:30 11/05/13 (wilsonm)     Labs: Basic Metabolic Panel:  Recent Labs Lab 11/05/13 0110 11/06/13 0347 11/07/13 0620  NA 135 134* 134*  K 3.6 3.5 4.1  CL 99 99 99  CO2 18* 23 23  GLUCOSE 41* 154* 97  BUN 3* 3* 3*  CREATININE 0.95 0.83 0.73  CALCIUM 9.6 8.4 8.7  MG 1.9  --   --   PHOS 1.1*  --  3.7   Liver Function Tests:  Recent Labs Lab 11/05/13 0110 11/06/13 0347 11/07/13 0620  AST 24 17 34  ALT 17 14 23   ALKPHOS 115 103 112  BILITOT 0.2* 0.1* 0.2*  PROT 7.4 6.5 7.0  ALBUMIN 4.0 3.4* 3.7   No results found for this basename: LIPASE, AMYLASE,  in the last 168 hours No results found for this basename: AMMONIA,  in the last 168 hours CBC:  Recent Labs Lab 11/05/13 0110  WBC 4.9  HGB 13.6  HCT 40.2  MCV 89.1  PLT 279   Cardiac Enzymes: No results found for this basename: CKTOTAL, CKMB, CKMBINDEX, TROPONINI,  in the last 168 hours BNP: BNP (last 3 results) No results found for this basename: PROBNP,  in the last 8760 hours CBG:  Recent Labs Lab 11/06/13 1951 11/07/13 0008 11/07/13 0411 11/07/13 0734 11/08/13 1709  GLUCAP 118* 148* 137* 99 93       Signed:  Shamara Soza N  [facility] 11/11/2013, 4:31 PM

## 2013-11-11 NOTE — Progress Notes (Addendum)
[facility] PROGRESS NOTE  [name] ZOX:[id number] DOB: Dec 26, 1975 DOA: 11/04/2013 PCP: No primary provider on file.  Assessment/Plan: Brief narrative:  37 year old male patient with known history of depression and hypertension. Presented to the emergency department with severe hypoglycemia. He intentionally injected 5 pens of aspart insulin (300 units each ) as well as took a whole bottle of NyQuil, several pills of Klonopin and drank 4 beers. After about 15-20 minutes he decided to call EMS. Upon arrival of EMS his blood sugar was noted to be 46 and he was given D50 and brought to the emergency department at [facility]. Per their records this was not his first suicide attempt in niece had multiple psychiatric hospitalizations. He was started on a DT and effusion but due to the lack of ICU beds was subsequently transferred to [facility]. Upon arrival and acetaminophen level was repeated and was down to 36. Apparently was reported as much higher at the other facility. Patient reported he had been having significant pain in his ribs after fall 2-3 weeks ago apparently was diagnosed with rib fractures   Assessment/Plan:  1. Suicide attempt by drug ingestion/Insulin overdose/hypoglycemia  No hypoglycemia. -d/w psychiatry: initial plan was to admit to [facility]; patient stayed in [place], evaluated by psychiatry who recommended to d/c home and outpatient follow up; patient is no longer actively suicidal;  2. Seizure disorder  -has not been taking Tegretol as prescribed for at least 2 months-pt endorsed has had seizures without meds -resume antiepileptics 3. Rib fractures  -pt says fell at home a few weeks ago- went to another ER and was told had rib fxs/requesting pain meds  -CXR here revealed multiple right rib non-displaced fractures cont pain control 4. HTN  -was not taking meds at home  -was supposed to be on Norvasc and Vasotec at home  -renal function was  normal so resumed above meds  5. Situational depression  -pt describes onset sx's after break up with girlfriend several months ago  -resume Wellbutrin    Initial plan was to admit [facility], but per psychiatry patient is no loner actively suicidal, and cleared to go home, with outpatient follow up;   Code Status: full Family Communication: d/w patient  (indicate person spoken with, relationship, and if by phone, the number) Disposition Plan: [facility]   Consultants:  Psychiatry   Procedures:  None   Antibiotics:  None  (indicate start date, and stop date if known)  HPI/Subjective: alert  Objective: Filed Vitals:   11/11/13 0602  BP: 109/61  Pulse: 61  Temp: 98.1 F (36.7 C)  Resp: 18   No intake or output data in the 24 hours ending 11/11/13 1045 Filed Weights   11/04/13 2345 11/06/13 0500  Weight: 94.6 kg (208 lb 8.9 oz) 97.7 kg (215 lb 6.2 oz)    Exam:   General:  alert  Cardiovascular: s1,s2 rrr  Respiratory: CTA BL  Abdomen: soft, nt, nd   Musculoskeletal: no LE edema   Data Reviewed: Basic Metabolic Panel:  Recent Labs Lab 11/05/13 0110 11/06/13 0347 11/07/13 0620  NA 135 134* 134*  K 3.6 3.5 4.1  CL 99 99 99  CO2 18* 23 23  GLUCOSE 41* 154* 97  BUN 3* 3* 3*  CREATININE 0.95 0.83 0.73  CALCIUM 9.6 8.4 8.7  MG 1.9  --   --   PHOS 1.1*  --  3.7   Liver Function Tests:  Recent Labs Lab 11/05/13 0110 11/06/13 0347 11/07/13  0620  AST 24 17 34  ALT 17 14 23   ALKPHOS 115 103 112  BILITOT 0.2* 0.1* 0.2*  PROT 7.4 6.5 7.0  ALBUMIN 4.0 3.4* 3.7   No results found for this basename: LIPASE, AMYLASE,  in the last 168 hours No results found for this basename: AMMONIA,  in the last 168 hours CBC:  Recent Labs Lab 11/05/13 0110  WBC 4.9  HGB 13.6  HCT 40.2  MCV 89.1  PLT 279   Cardiac Enzymes: No results found for this basename: CKTOTAL, CKMB, CKMBINDEX, TROPONINI,  in the last 168 hours BNP (last 3 results) No results found for this  basename: PROBNP,  in the last 8760 hours CBG:  Recent Labs Lab 11/06/13 1951 11/07/13 0008 11/07/13 0411 11/07/13 0734 11/08/13 1709  GLUCAP 118* 148* 137* 99 93    Recent Results (from the past 240 hour(s))  MRSA PCR SCREENING     Status: Abnormal   Collection Time    11/05/13  7:32 AM      Result Value Range Status   MRSA by PCR POSITIVE (*) NEGATIVE Final   Comment:            The GeneXpert MRSA Assay (FDA     approved for NASAL specimens     only), is one component of a     comprehensive MRSA colonization     surveillance program. It is not     intended to diagnose MRSA     infection nor to guide or     monitor treatment for     MRSA infections.     RESULT CALLED TO, READ BACK BY AND VERIFIED WITH:     Patty Sermons RN 11:30 11/05/13 (wilsonm)     Studies: No results found.  Scheduled Meds: . amLODipine  10 mg Oral Daily  . carbamazepine  200 mg Oral BID  . docusate sodium  100 mg Oral BID  . enalapril  10 mg Oral Daily  . famotidine  40 mg Oral Daily  . folic acid  1 mg Oral Daily  . multivitamin with minerals  1 tablet Oral Daily  . nicotine  21 mg Transdermal Daily  . thiamine  100 mg Oral Daily   Continuous Infusions:   Active Problems:   Suicide attempt by drug ingestion   Insulin overdose   Alcohol abuse   Seizure disorder   Hypophosphatemia   Tobacco abuse   Situational depression   Rib pain   HTN (hypertension)   Hypoglycemia    Time spent: >35 minutes     [name]  [facility] Pager [phone]. If 7PM-7AM, please contact night-coverage at [email or web address], password [facility] 11/11/2013, 10:45 AM  LOS: 7 days

## 2013-11-11 NOTE — Progress Notes (Signed)
[name] to be D/C'd Home per MD order.  Discussed with the patient and all questions fully answered.    Medication List    STOP taking these medications       clonazePAM 2 MG tablet  Commonly known as:  KLONOPIN      TAKE these medications       amLODipine 10 MG tablet  Commonly known as:  NORVASC  Take 1 tablet (10 mg total) by mouth daily.     buPROPion 150 MG 12 hr tablet  Commonly known as:  WELLBUTRIN SR  Take 1 tablet (150 mg total) by mouth 2 (two) times daily.     carbamazepine 200 MG 12 hr tablet  Commonly known as:  TEGRETOL XR  Take 1 tablet (200 mg total) by mouth 2 (two) times daily.     enalapril 10 MG tablet  Commonly known as:  VASOTEC  Take 1 tablet (10 mg total) by mouth daily.     esomeprazole 40 MG capsule  Commonly known as:  NEXIUM  Take 40 mg by mouth daily at 12 noon.     ibuprofen 400 MG tablet  Commonly known as:  ADVIL,MOTRIN  Take 1 tablet (400 mg total) by mouth every 6 (six) hours as needed for mild pain.     multivitamin with minerals Tabs tablet  Take 1 tablet by mouth daily.     mupirocin ointment 2 %  Commonly known as:  BACTROBAN  Place 1 application into the nose 2 (two) times daily.        VVS, Skin clean, dry and intact without evidence of skin break down, no evidence of skin tears noted. IV catheter discontinued intact. Site without signs and symptoms of complications. Dressing and pressure applied.  An After Visit Summary was printed and given to the patient. Patient escorted via WC, and D/C home via private auto.  Driggers, Rae Roam 11/11/2013 4:55 PM

## 2013-11-11 NOTE — Progress Notes (Signed)
[facility] PROGRESS NOTE  [name] ZOX:[id number] DOB: 07/13/1976 DOA: 11/04/2013 PCP: No primary provider on file.   D/w Dr. Sharol Roussel psychiatrist [facility] who recommended to [facility] state facility referral due to multiple suicidal attempt;  [facility] #[id number] Contact # for Dr. Sharol Roussel [phone] D/w case management, who will make a referral, awaiting p[sychiatruy input    [name]  [facility] Pager [phone]. If 7PM-7AM, please contact night-coverage at [email or web address], password [facility] 11/11/2013, 2:42 PM  LOS: 7 days

## 2013-11-11 NOTE — Treatment Plan (Signed)
Pt not seen by psychiatry since 12/16.  Pt. Needs a new consult so his info has been placed in the consult book so he may be seen later today.

## 2013-11-11 NOTE — Progress Notes (Signed)
CSW (Clinical Child psychotherapist) made aware by Tanna Savoy at [facility] that pt has not been seen since 12/16 and requires a new consult. CSW updated CM. Will continue to follow and assist.  Tima Curet, LCSWA [phone]

## 2013-11-11 NOTE — Progress Notes (Signed)
CSW contacted [facility] [phone]) and spoke with Joanna Hews, QP and Maren Reamer, RN. They informed CSW that pt receives ACT services but at this time their team is recommending in patient treatment. CSW called [facility] and updated. Awaiting decision on whether pt will be accepted in patient and if there is bed availability.  Norvin Ohlin, LCSWA [phone]

## 2013-11-13 IMAGING — CR DG CHEST 2V
1 series · 3 of 3 positions shown · non-contrast
Comparison: none

REASON FOR EXAM: CVA
COMMENTS:   May transport without cardiac monitor

[Series 1: w chest pa · 0.14mm/px · 3 of 3 slices shown]
[im 1/3]
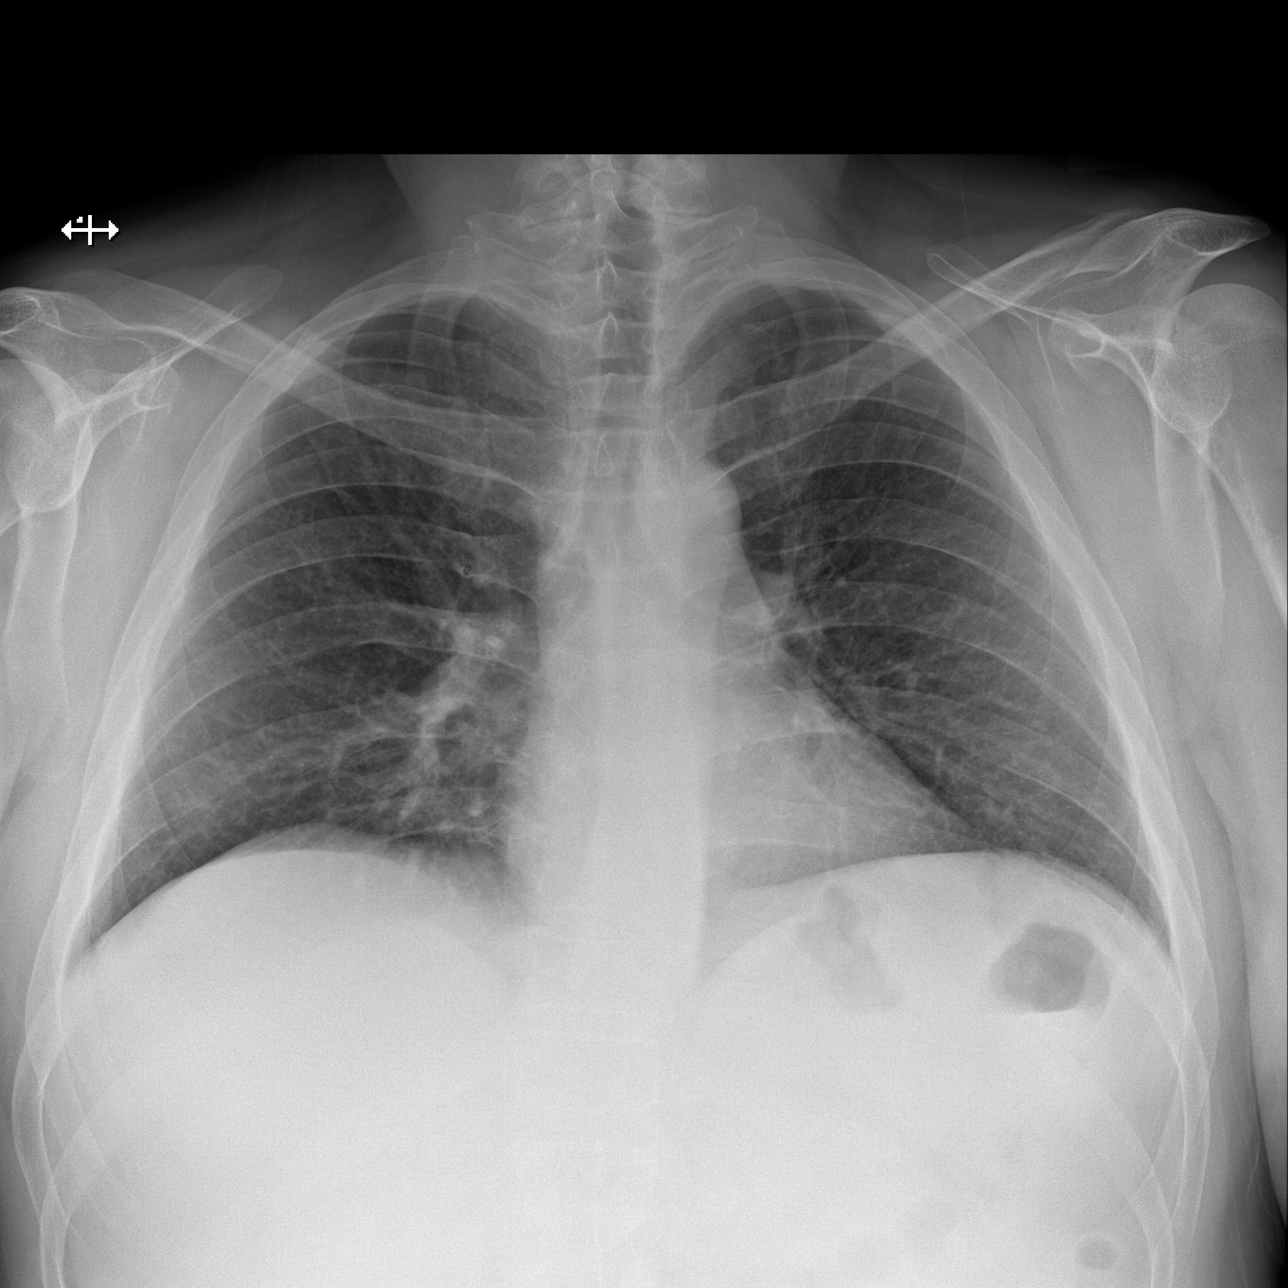
[im 2/3]
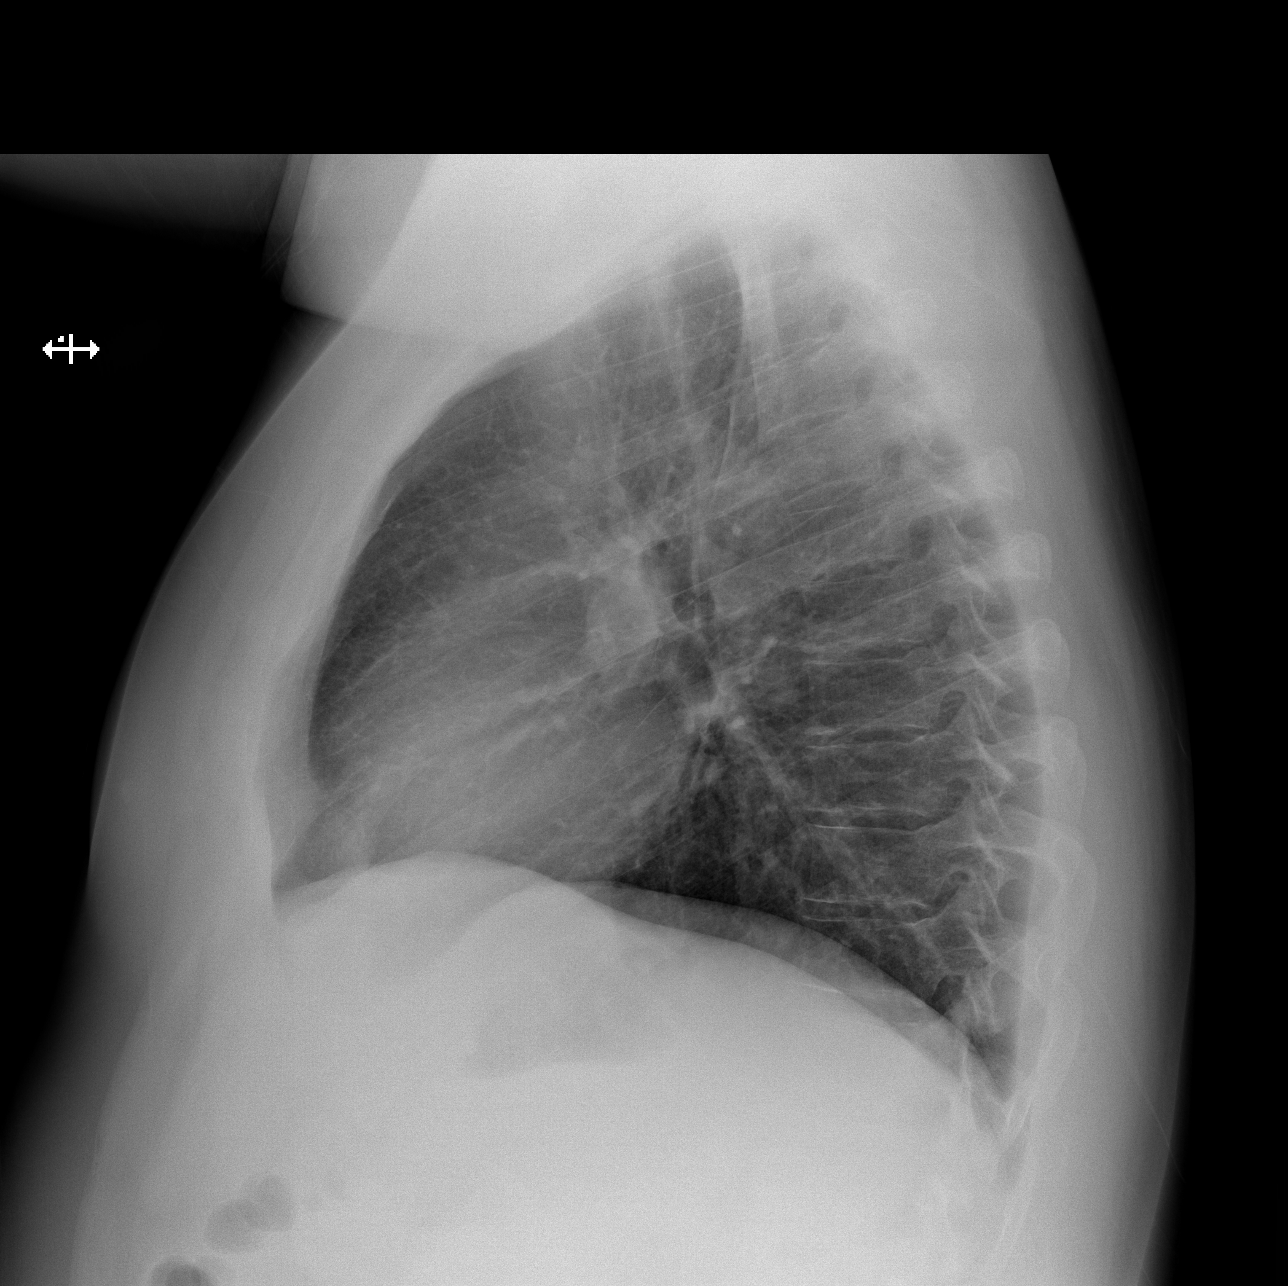
[im 3/3]
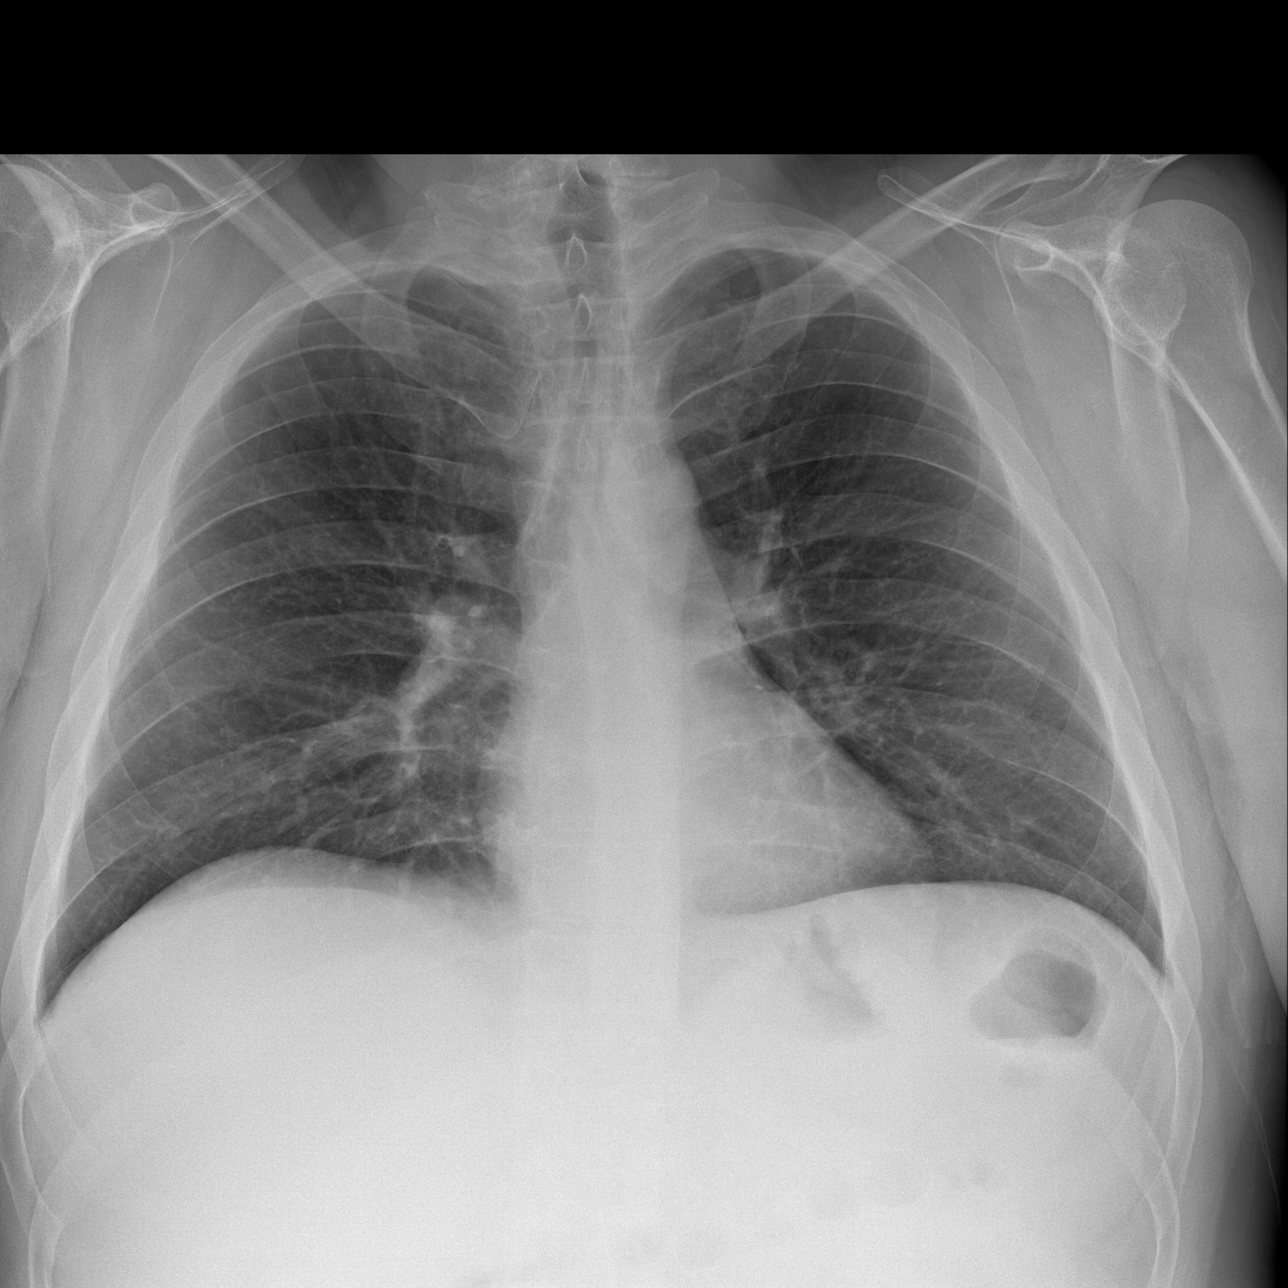

[3 of 3 positions shown; findings below may reference images not displayed]

PROCEDURE:     DXR - DXR CHEST PA (OR AP) AND LATERAL  - October 09, 2011  [DATE]

RESULT:     Comparison is made to the study June 11, 2010.

The lungs are adequately inflated. There is no focal infiltrate. The cardiac
silhouette is normal in size. The pulmonary vascularity is not engorged.
There is no pleural effusion. The mediastinum is normal in width.
IMPRESSION: I see no findings to suggest aspiration nor other acute
cardiopulmonary abnormality.

## 2013-11-13 IMAGING — CT CT HEAD WITHOUT CONTRAST
2 series · 16 of 30 positions shown, 20 images · non-contrast
Comparison: none

REASON FOR EXAM: ha numbness left
COMMENTS:

[Series 2: without · axial · non-contrast · 0.46mm/px · z∈[+280,+410]mm · 13 of 32 slices shown, 17 images]
[im 3/32  brain]
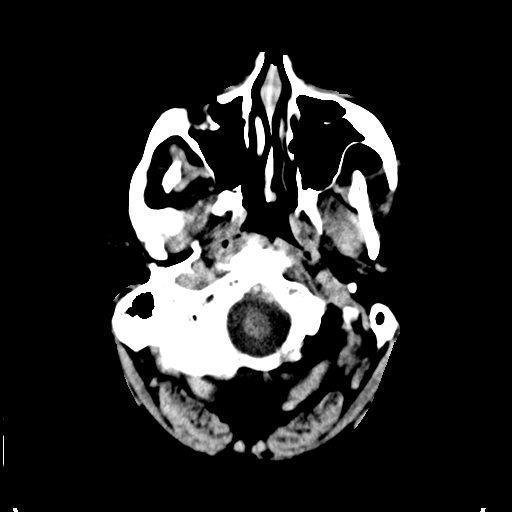
[im 3/32  bone]
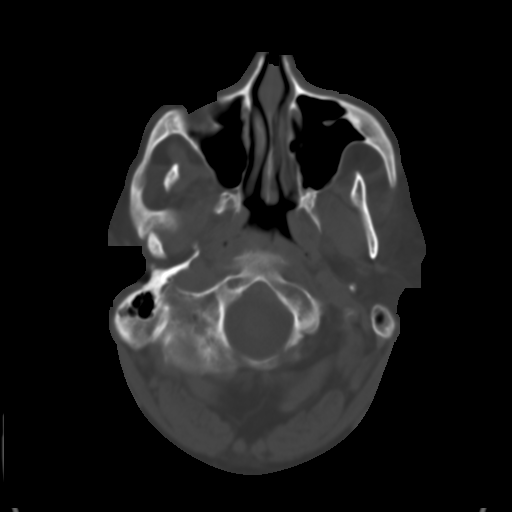
[im 5/32  brain]
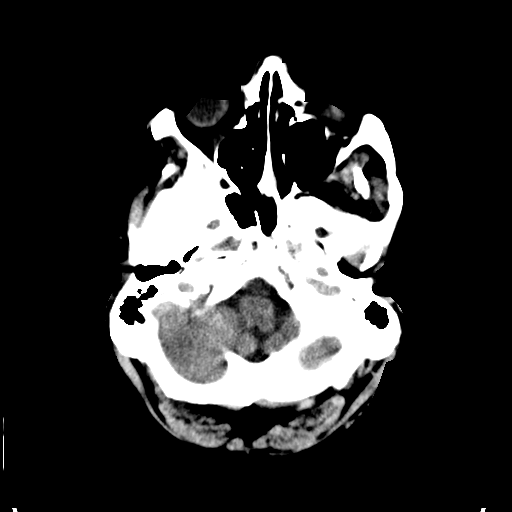
[im 7/32  brain]
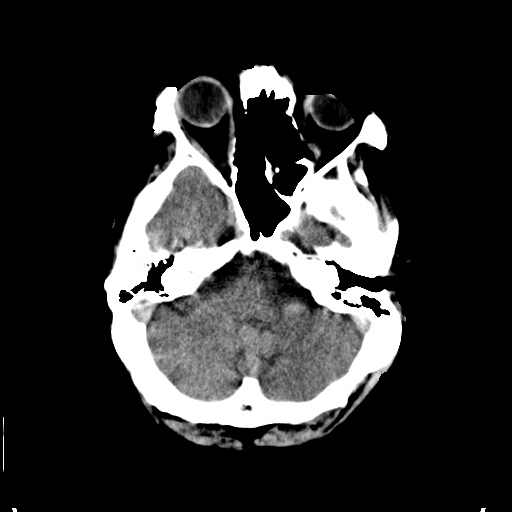
[im 9/32  brain]
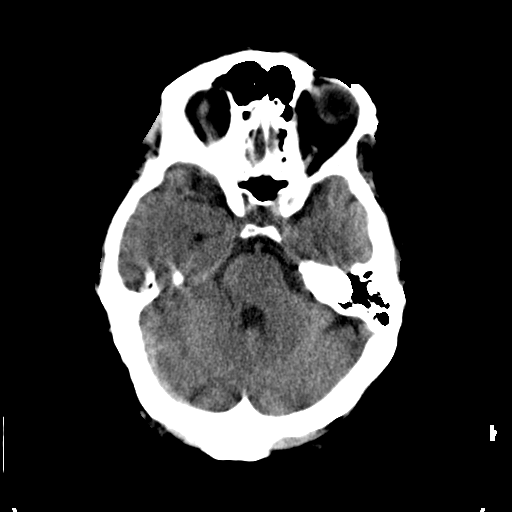
[im 12/32  brain]
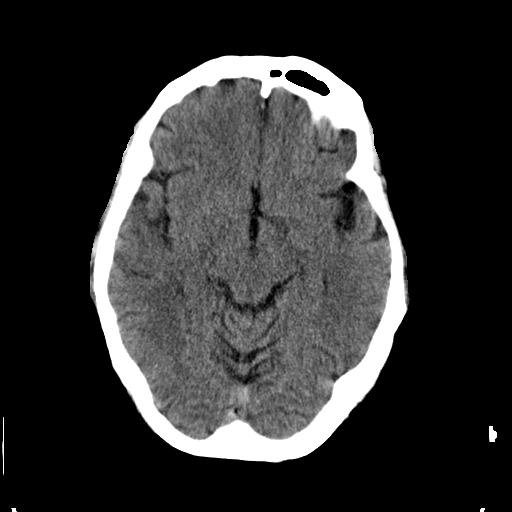
[im 12/32  bone]
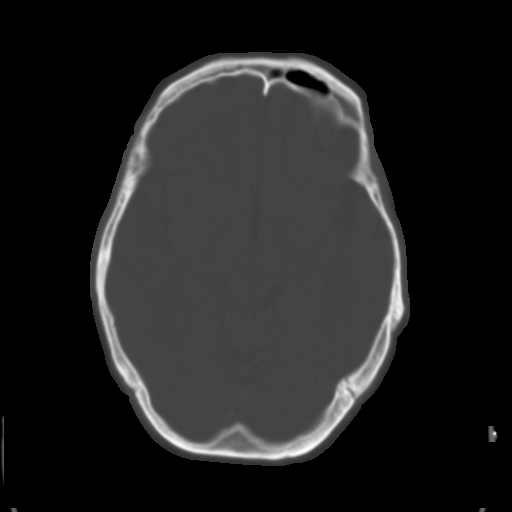
[im 14/32  brain]
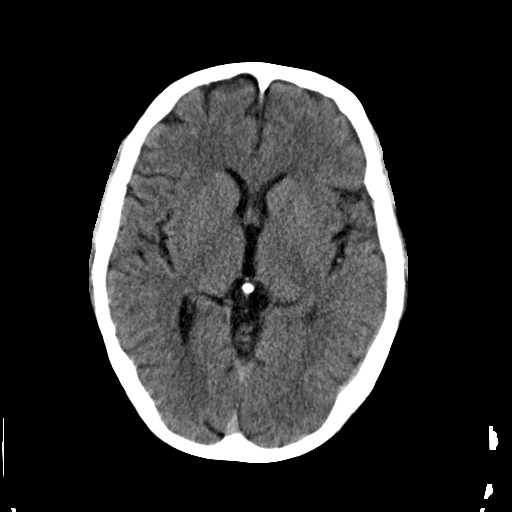
[im 16/32  brain]
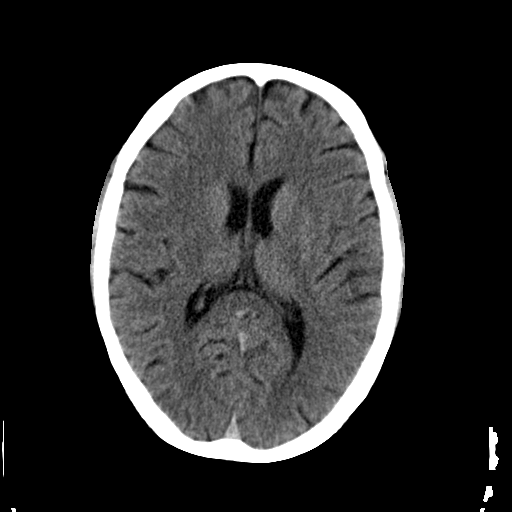
[im 18/32  brain]
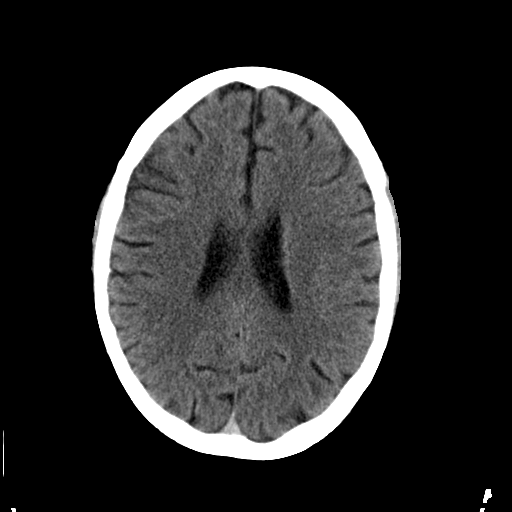
[im 20/32  brain]
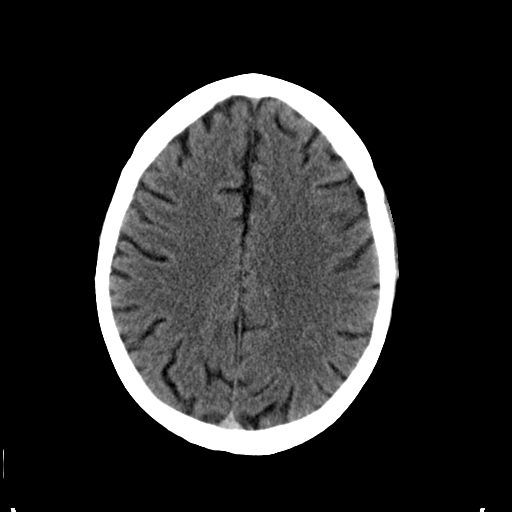
[im 20/32  bone]
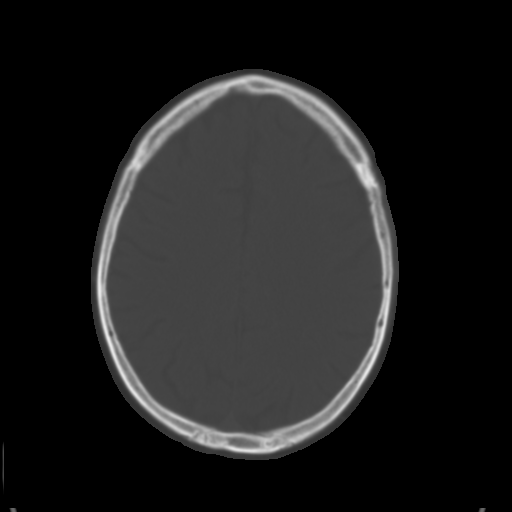
[im 23/32  brain]
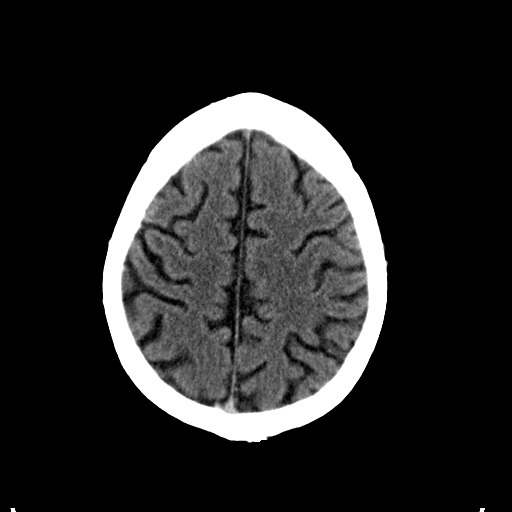
[im 25/32  brain]
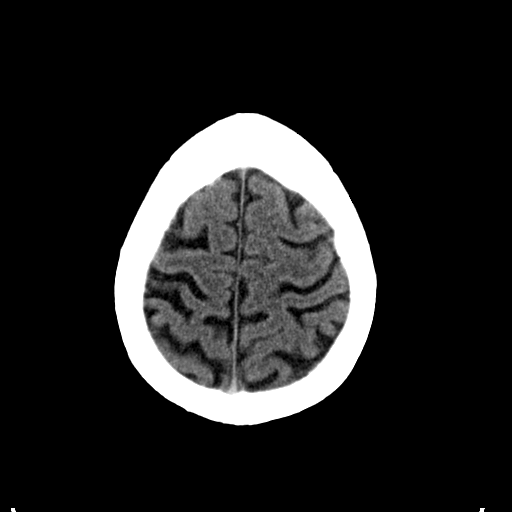
[im 27/32  brain]
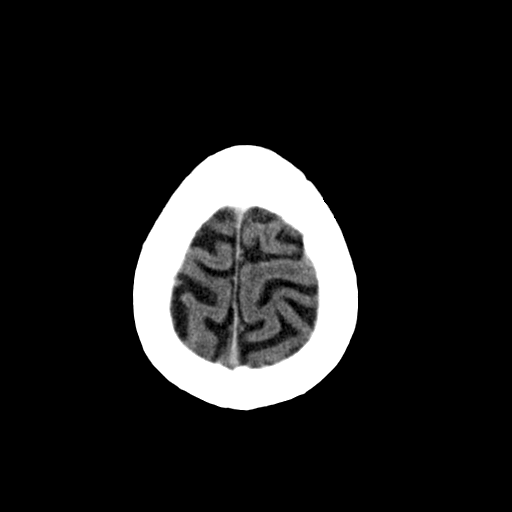
[im 29/32  brain]
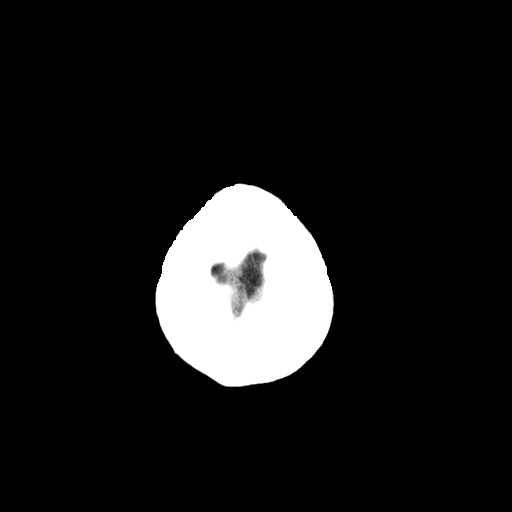
[im 29/32  bone]
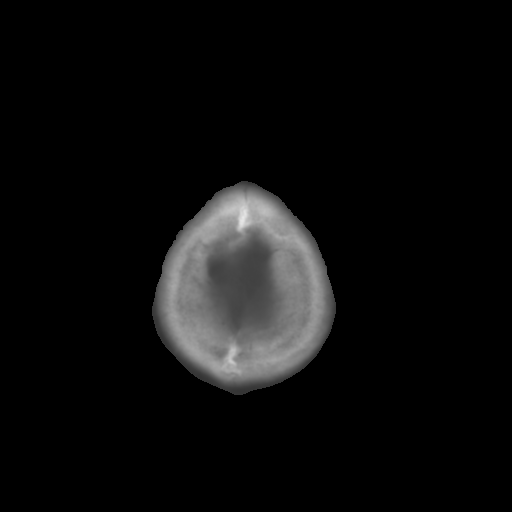

[Series 3: bone · axial · 0.46mm/px · z∈[+280,+326]mm · 3 of 32 slices shown]
[im 3/32  bone]
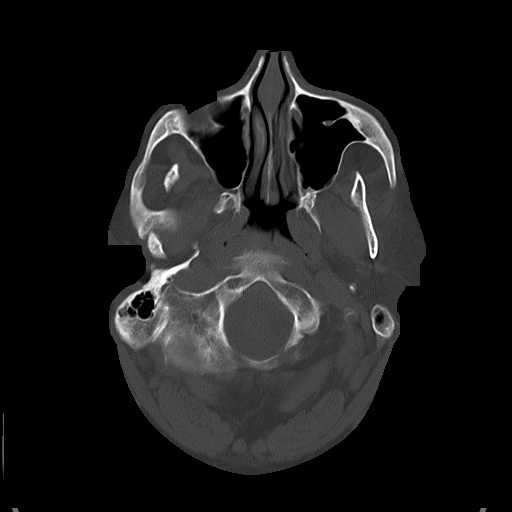
[im 7/32  bone]
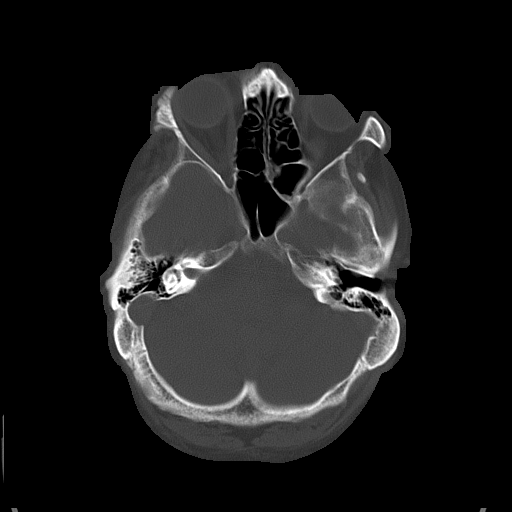
[im 12/32  bone]
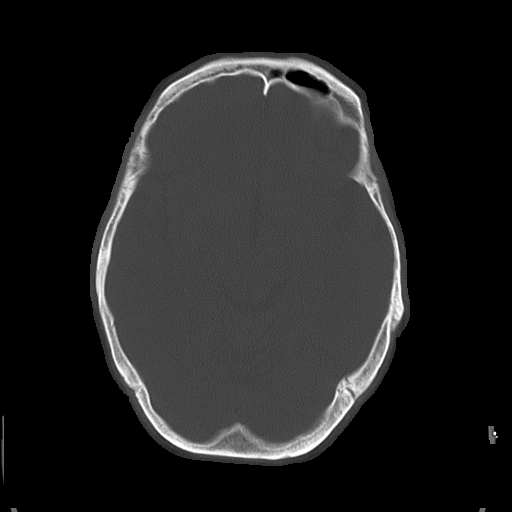

[16 of 30 positions shown; findings below may reference images not displayed]

PROCEDURE:     CT  - CT HEAD WITHOUT CONTRAST  - October 09, 2011  [DATE]

RESULT:     Axial noncontrast CT scanning was performed through the brain at
5 mm intervals and slice thicknesses.

The ventricles are normal in size and position. There is no intracranial
hemorrhage nor intracranial mass effect. I see no abnormal intracranial
calcifications. There is no evidence of an evolving ischemic infarction. The
cerebellum and brainstem are normal in density.

At bone window settings the observed portions of the paranasal sinuses and
mastoid air cells are clear. There is no evidence of an acute skull fracture.
IMPRESSION: Normal noncontrast CT scan of the brain.

## 2013-11-15 ENCOUNTER — Emergency Department: Payer: Self-pay | Admitting: Emergency Medicine

## 2013-11-15 LAB — CBC
HCT: 40 % (ref 40.0–52.0)
HGB: 13.4 g/dL (ref 13.0–18.0)
MCH: 30 pg (ref 26.0–34.0)
MCHC: 33.4 g/dL (ref 32.0–36.0)
MCV: 90 fL (ref 80–100)
Platelet: 253 10*3/uL (ref 150–440)
RBC: 4.46 10*6/uL (ref 4.40–5.90)

## 2013-11-15 LAB — COMPREHENSIVE METABOLIC PANEL
Albumin: 3.6 g/dL (ref 3.4–5.0)
Alkaline Phosphatase: 103 U/L
Anion Gap: 5 — ABNORMAL LOW (ref 7–16)
Bilirubin,Total: <0.1 mg/dL — ABNORMAL LOW (ref 0.2–1.0)
Calcium, Total: 8.6 mg/dL (ref 8.5–10.1)
Chloride: 111 mmol/L — ABNORMAL HIGH (ref 98–107)
Creatinine: 0.76 mg/dL (ref 0.60–1.30)
EGFR (African American): >60
EGFR (Non-African Amer.): >60
Osmolality: 274 (ref 275–301)
Potassium: 4.3 mmol/L (ref 3.5–5.1)
SGOT(AST): 23 U/L (ref 15–37)
Sodium: 139 mmol/L (ref 136–145)

## 2013-11-15 LAB — URINALYSIS, COMPLETE
Bacteria: NONE SEEN
Blood: NEGATIVE
Glucose,UR: NEGATIVE mg/dL (ref 0–75)
RBC,UR: NONE SEEN /HPF (ref 0–5)
Squamous Epithelial: NONE SEEN

## 2013-11-24 ENCOUNTER — Inpatient Hospital Stay: Payer: Self-pay | Admitting: Internal Medicine

## 2013-11-24 LAB — COMPREHENSIVE METABOLIC PANEL
ALT: 28 U/L (ref 12–78)
Albumin: 4.1 g/dL (ref 3.4–5.0)
Alkaline Phosphatase: 138 U/L — ABNORMAL HIGH
Anion Gap: 8 (ref 7–16)
BILIRUBIN TOTAL: 0.2 mg/dL (ref 0.2–1.0)
BUN: 4 mg/dL — AB (ref 7–18)
CALCIUM: 8.7 mg/dL (ref 8.5–10.1)
CHLORIDE: 104 mmol/L (ref 98–107)
Co2: 21 mmol/L (ref 21–32)
Creatinine: 0.83 mg/dL (ref 0.60–1.30)
EGFR (African American): >60
Glucose: 69 mg/dL (ref 65–99)
Osmolality: 262 (ref 275–301)
POTASSIUM: 4 mmol/L (ref 3.5–5.1)
SGOT(AST): 35 U/L (ref 15–37)
SODIUM: 133 mmol/L — AB (ref 136–145)
Total Protein: 8.3 g/dL — ABNORMAL HIGH (ref 6.4–8.2)

## 2013-11-24 LAB — DRUG SCREEN, URINE
Amphetamines, Ur Screen: NEGATIVE (ref ?–1000)
Barbiturates, Ur Screen: NEGATIVE (ref ?–200)
Benzodiazepine, Ur Scrn: NEGATIVE (ref ?–200)
CANNABINOID 50 NG, UR ~~LOC~~: NEGATIVE (ref ?–50)
Cocaine Metabolite,Ur ~~LOC~~: NEGATIVE (ref ?–300)
MDMA (Ecstasy)Ur Screen: POSITIVE (ref ?–500)
Methadone, Ur Screen: NEGATIVE (ref ?–300)
Opiate, Ur Screen: NEGATIVE (ref ?–300)
Phencyclidine (PCP) Ur S: NEGATIVE (ref ?–25)
Tricyclic, Ur Screen: NEGATIVE (ref ?–1000)

## 2013-11-24 LAB — URINALYSIS, COMPLETE
BACTERIA: NONE SEEN
BLOOD: NEGATIVE
Bilirubin,UR: NEGATIVE
Ketone: NEGATIVE
Leukocyte Esterase: NEGATIVE
Nitrite: NEGATIVE
Ph: 5 (ref 4.5–8.0)
Protein: NEGATIVE
RBC,UR: NONE SEEN /HPF (ref 0–5)
Specific Gravity: 1.005 (ref 1.003–1.030)
Squamous Epithelial: NONE SEEN

## 2013-11-24 LAB — CBC
HCT: 45 % (ref 40.0–52.0)
HGB: 15 g/dL (ref 13.0–18.0)
MCH: 29.1 pg (ref 26.0–34.0)
MCHC: 33.4 g/dL (ref 32.0–36.0)
MCV: 87 fL (ref 80–100)
Platelet: 388 10*3/uL (ref 150–440)
RBC: 5.16 10*6/uL (ref 4.40–5.90)
RDW: 17 % — AB (ref 11.5–14.5)
WBC: 7.8 10*3/uL (ref 3.8–10.6)

## 2013-11-24 LAB — ETHANOL
Ethanol %: 0.183 % — ABNORMAL HIGH (ref 0.000–0.080)
Ethanol: 183 mg/dL

## 2013-11-24 LAB — ACETAMINOPHEN LEVEL: Acetaminophen: <2 ug/mL

## 2013-11-25 LAB — BASIC METABOLIC PANEL
Anion Gap: 6 — ABNORMAL LOW (ref 7–16)
BUN: 6 mg/dL — ABNORMAL LOW (ref 7–18)
Calcium, Total: 8.6 mg/dL (ref 8.5–10.1)
Chloride: 101 mmol/L (ref 98–107)
Co2: 25 mmol/L (ref 21–32)
Creatinine: 0.85 mg/dL (ref 0.60–1.30)
EGFR (African American): >60
EGFR (Non-African Amer.): >60
GLUCOSE: 71 mg/dL (ref 65–99)
OSMOLALITY: 261 (ref 275–301)
POTASSIUM: 3.8 mmol/L (ref 3.5–5.1)
SODIUM: 132 mmol/L — AB (ref 136–145)

## 2013-11-25 LAB — CBC WITH DIFFERENTIAL/PLATELET
BASOS ABS: 0 10*3/uL (ref 0.0–0.1)
BASOS PCT: 0.5 %
Eosinophil #: 0.3 10*3/uL (ref 0.0–0.7)
Eosinophil %: 3.6 %
HCT: 41.8 % (ref 40.0–52.0)
HGB: 13.7 g/dL (ref 13.0–18.0)
LYMPHS PCT: 31.2 %
Lymphocyte #: 2.4 10*3/uL (ref 1.0–3.6)
MCH: 28.8 pg (ref 26.0–34.0)
MCHC: 32.8 g/dL (ref 32.0–36.0)
MCV: 88 fL (ref 80–100)
MONO ABS: 1 x10 3/mm (ref 0.2–1.0)
MONOS PCT: 13.3 %
NEUTROS ABS: 4 10*3/uL (ref 1.4–6.5)
Neutrophil %: 51.4 %
Platelet: 236 10*3/uL (ref 150–440)
RBC: 4.75 10*6/uL (ref 4.40–5.90)
RDW: 16.6 % — ABNORMAL HIGH (ref 11.5–14.5)
WBC: 7.7 10*3/uL (ref 3.8–10.6)

## 2013-11-26 LAB — BASIC METABOLIC PANEL
ANION GAP: 4 — AB (ref 7–16)
BUN: 6 mg/dL — ABNORMAL LOW (ref 7–18)
Calcium, Total: 8.9 mg/dL (ref 8.5–10.1)
Chloride: 102 mmol/L (ref 98–107)
Co2: 29 mmol/L (ref 21–32)
Creatinine: 0.74 mg/dL (ref 0.60–1.30)
EGFR (African American): >60
EGFR (Non-African Amer.): >60
GLUCOSE: 62 mg/dL — AB (ref 65–99)
OSMOLALITY: 266 (ref 275–301)
Potassium: 3.8 mmol/L (ref 3.5–5.1)
Sodium: 135 mmol/L — ABNORMAL LOW (ref 136–145)

## 2013-11-28 ENCOUNTER — Inpatient Hospital Stay: Payer: Self-pay | Admitting: Internal Medicine

## 2013-11-28 LAB — COMPREHENSIVE METABOLIC PANEL
ALK PHOS: 109 U/L
ANION GAP: 8 (ref 7–16)
AST: 40 U/L — AB (ref 15–37)
Albumin: 3.8 g/dL (ref 3.4–5.0)
BILIRUBIN TOTAL: 0.3 mg/dL (ref 0.2–1.0)
BUN: 4 mg/dL — AB (ref 7–18)
CALCIUM: 8.7 mg/dL (ref 8.5–10.1)
CO2: 24 mmol/L (ref 21–32)
Chloride: 106 mmol/L (ref 98–107)
Creatinine: 0.64 mg/dL (ref 0.60–1.30)
EGFR (Non-African Amer.): >60
GLUCOSE: 32 mg/dL — AB (ref 65–99)
Osmolality: 269 (ref 275–301)
Potassium: 4.1 mmol/L (ref 3.5–5.1)
SGPT (ALT): 30 U/L (ref 12–78)
Sodium: 138 mmol/L (ref 136–145)
Total Protein: 7.8 g/dL (ref 6.4–8.2)

## 2013-11-28 LAB — DRUG SCREEN, URINE
Amphetamines, Ur Screen: NEGATIVE (ref ?–1000)
Barbiturates, Ur Screen: NEGATIVE (ref ?–200)
Benzodiazepine, Ur Scrn: NEGATIVE (ref ?–200)
CANNABINOID 50 NG, UR ~~LOC~~: NEGATIVE (ref ?–50)
COCAINE METABOLITE, UR ~~LOC~~: NEGATIVE (ref ?–300)
MDMA (Ecstasy)Ur Screen: POSITIVE (ref ?–500)
Methadone, Ur Screen: NEGATIVE (ref ?–300)
Opiate, Ur Screen: NEGATIVE (ref ?–300)
Phencyclidine (PCP) Ur S: NEGATIVE (ref ?–25)
TRICYCLIC, UR SCREEN: NEGATIVE (ref ?–1000)

## 2013-11-28 LAB — URINALYSIS, COMPLETE
BILIRUBIN, UR: NEGATIVE
Bacteria: NONE SEEN
Blood: NEGATIVE
Ketone: NEGATIVE
Leukocyte Esterase: NEGATIVE
NITRITE: NEGATIVE
PROTEIN: NEGATIVE
Ph: 6 (ref 4.5–8.0)
RBC, UR: NONE SEEN /HPF (ref 0–5)
Specific Gravity: 1.001 (ref 1.003–1.030)
Squamous Epithelial: NONE SEEN
WBC UR: NONE SEEN /HPF (ref 0–5)

## 2013-11-28 LAB — ACETAMINOPHEN LEVEL: Acetaminophen: <2 ug/mL

## 2013-11-28 LAB — ETHANOL
Ethanol %: 0.125 % — ABNORMAL HIGH (ref 0.000–0.080)
Ethanol: 125 mg/dL

## 2013-11-28 LAB — SALICYLATE LEVEL

## 2013-11-28 LAB — CBC
HCT: 41.2 % (ref 40.0–52.0)
HGB: 13.8 g/dL (ref 13.0–18.0)
MCH: 29.6 pg (ref 26.0–34.0)
MCHC: 33.4 g/dL (ref 32.0–36.0)
MCV: 89 fL (ref 80–100)
Platelet: 213 10*3/uL (ref 150–440)
RBC: 4.65 10*6/uL (ref 4.40–5.90)
RDW: 16.4 % — ABNORMAL HIGH (ref 11.5–14.5)
WBC: 12.1 10*3/uL — ABNORMAL HIGH (ref 3.8–10.6)

## 2013-11-28 LAB — TSH: THYROID STIMULATING HORM: 0.52 u[IU]/mL

## 2013-11-29 LAB — CBC WITH DIFFERENTIAL/PLATELET
Basophil #: 0 10*3/uL (ref 0.0–0.1)
Basophil %: 0.3 %
Eosinophil #: 0.3 10*3/uL (ref 0.0–0.7)
Eosinophil %: 2.4 %
HCT: 40.6 % (ref 40.0–52.0)
HGB: 13.4 g/dL (ref 13.0–18.0)
LYMPHS PCT: 18.7 %
Lymphocyte #: 2 10*3/uL (ref 1.0–3.6)
MCH: 28.8 pg (ref 26.0–34.0)
MCHC: 33 g/dL (ref 32.0–36.0)
MCV: 87 fL (ref 80–100)
MONOS PCT: 9.4 %
Monocyte #: 1 x10 3/mm (ref 0.2–1.0)
NEUTROS ABS: 7.5 10*3/uL — AB (ref 1.4–6.5)
NEUTROS PCT: 69.2 %
Platelet: 213 10*3/uL (ref 150–440)
RBC: 4.66 10*6/uL (ref 4.40–5.90)
RDW: 16.1 % — AB (ref 11.5–14.5)
WBC: 10.8 10*3/uL — ABNORMAL HIGH (ref 3.8–10.6)

## 2013-11-29 LAB — BASIC METABOLIC PANEL
Anion Gap: 5 — ABNORMAL LOW (ref 7–16)
BUN: 6 mg/dL — ABNORMAL LOW (ref 7–18)
CALCIUM: 8.4 mg/dL — AB (ref 8.5–10.1)
CHLORIDE: 104 mmol/L (ref 98–107)
CO2: 26 mmol/L (ref 21–32)
Creatinine: 0.62 mg/dL (ref 0.60–1.30)
EGFR (African American): >60
EGFR (Non-African Amer.): >60
Glucose: 115 mg/dL — ABNORMAL HIGH (ref 65–99)
OSMOLALITY: 269 (ref 275–301)
Potassium: 3.4 mmol/L — ABNORMAL LOW (ref 3.5–5.1)
SODIUM: 135 mmol/L — AB (ref 136–145)

## 2013-11-30 LAB — BASIC METABOLIC PANEL
Anion Gap: 2 — ABNORMAL LOW (ref 7–16)
BUN: 7 mg/dL (ref 7–18)
CALCIUM: 8.6 mg/dL (ref 8.5–10.1)
CO2: 30 mmol/L (ref 21–32)
Chloride: 107 mmol/L (ref 98–107)
Creatinine: 0.71 mg/dL (ref 0.60–1.30)
EGFR (Non-African Amer.): >60
GLUCOSE: 59 mg/dL — AB (ref 65–99)
Osmolality: 273 (ref 275–301)
Potassium: 4.3 mmol/L (ref 3.5–5.1)
Sodium: 139 mmol/L (ref 136–145)

## 2014-01-17 ENCOUNTER — Inpatient Hospital Stay: Payer: Self-pay | Admitting: Internal Medicine

## 2014-01-17 LAB — COMPREHENSIVE METABOLIC PANEL
ALBUMIN: 3.6 g/dL (ref 3.4–5.0)
ALK PHOS: 189 U/L — AB
AST: 13 U/L — AB (ref 15–37)
Anion Gap: 9 (ref 7–16)
BUN: 4 mg/dL — ABNORMAL LOW (ref 7–18)
Bilirubin,Total: 0.4 mg/dL (ref 0.2–1.0)
CALCIUM: 8.5 mg/dL (ref 8.5–10.1)
CHLORIDE: 101 mmol/L (ref 98–107)
Co2: 25 mmol/L (ref 21–32)
Creatinine: 0.79 mg/dL (ref 0.60–1.30)
EGFR (Non-African Amer.): >60
GLUCOSE: 82 mg/dL (ref 65–99)
OSMOLALITY: 266 (ref 275–301)
POTASSIUM: 3.8 mmol/L (ref 3.5–5.1)
SGPT (ALT): 25 U/L (ref 12–78)
SODIUM: 135 mmol/L — AB (ref 136–145)
TOTAL PROTEIN: 7.9 g/dL (ref 6.4–8.2)

## 2014-01-17 LAB — CBC
HCT: 43.3 % (ref 40.0–52.0)
HGB: 14.2 g/dL (ref 13.0–18.0)
MCH: 28 pg (ref 26.0–34.0)
MCHC: 32.8 g/dL (ref 32.0–36.0)
MCV: 86 fL (ref 80–100)
PLATELETS: 250 10*3/uL (ref 150–440)
RBC: 5.06 10*6/uL (ref 4.40–5.90)
RDW: 15.7 % — ABNORMAL HIGH (ref 11.5–14.5)
WBC: 7.7 10*3/uL (ref 3.8–10.6)

## 2014-01-17 LAB — URINALYSIS, COMPLETE
BACTERIA: NONE SEEN
BLOOD: NEGATIVE
Bilirubin,UR: NEGATIVE
Glucose,UR: NEGATIVE mg/dL (ref 0–75)
Ketone: NEGATIVE
LEUKOCYTE ESTERASE: NEGATIVE
NITRITE: NEGATIVE
PROTEIN: NEGATIVE
Ph: 6 (ref 4.5–8.0)
RBC,UR: NONE SEEN /HPF (ref 0–5)
SPECIFIC GRAVITY: 1.001 (ref 1.003–1.030)
WBC UR: NONE SEEN /HPF (ref 0–5)

## 2014-01-17 LAB — ACETAMINOPHEN LEVEL: Acetaminophen: <2 ug/mL

## 2014-01-17 LAB — ETHANOL
ETHANOL %: 0.142 % — AB (ref 0.000–0.080)
ETHANOL LVL: 142 mg/dL

## 2014-01-17 LAB — DRUG SCREEN, URINE
Amphetamines, Ur Screen: NEGATIVE (ref ?–1000)
BARBITURATES, UR SCREEN: NEGATIVE (ref ?–200)
BENZODIAZEPINE, UR SCRN: NEGATIVE (ref ?–200)
CANNABINOID 50 NG, UR ~~LOC~~: NEGATIVE (ref ?–50)
Cocaine Metabolite,Ur ~~LOC~~: NEGATIVE (ref ?–300)
MDMA (ECSTASY) UR SCREEN: NEGATIVE (ref ?–500)
METHADONE, UR SCREEN: NEGATIVE (ref ?–300)
OPIATE, UR SCREEN: NEGATIVE (ref ?–300)
Phencyclidine (PCP) Ur S: NEGATIVE (ref ?–25)
Tricyclic, Ur Screen: NEGATIVE (ref ?–1000)

## 2014-01-17 LAB — SALICYLATE LEVEL: Salicylates, Serum: <1.7 mg/dL

## 2014-01-18 LAB — BASIC METABOLIC PANEL
ANION GAP: 6 — AB (ref 7–16)
BUN: 6 mg/dL — ABNORMAL LOW (ref 7–18)
CALCIUM: 8.2 mg/dL — AB (ref 8.5–10.1)
CO2: 29 mmol/L (ref 21–32)
Chloride: 101 mmol/L (ref 98–107)
Creatinine: 0.76 mg/dL (ref 0.60–1.30)
EGFR (African American): >60
EGFR (Non-African Amer.): >60
GLUCOSE: 101 mg/dL — AB (ref 65–99)
OSMOLALITY: 270 (ref 275–301)
Potassium: 3.2 mmol/L — ABNORMAL LOW (ref 3.5–5.1)
Sodium: 136 mmol/L (ref 136–145)

## 2014-01-18 LAB — CBC WITH DIFFERENTIAL/PLATELET
BASOS ABS: 0 10*3/uL (ref 0.0–0.1)
BASOS PCT: 0.6 %
Eosinophil #: 0.4 10*3/uL (ref 0.0–0.7)
Eosinophil %: 4.8 %
HCT: 38.6 % — ABNORMAL LOW (ref 40.0–52.0)
HGB: 12.6 g/dL — AB (ref 13.0–18.0)
LYMPHS ABS: 1.9 10*3/uL (ref 1.0–3.6)
Lymphocyte %: 25 %
MCH: 27.7 pg (ref 26.0–34.0)
MCHC: 32.5 g/dL (ref 32.0–36.0)
MCV: 85 fL (ref 80–100)
Monocyte #: 0.7 x10 3/mm (ref 0.2–1.0)
Monocyte %: 9.8 %
NEUTROS ABS: 4.4 10*3/uL (ref 1.4–6.5)
Neutrophil %: 59.8 %
Platelet: 185 10*3/uL (ref 150–440)
RBC: 4.53 10*6/uL (ref 4.40–5.90)
RDW: 15.8 % — ABNORMAL HIGH (ref 11.5–14.5)
WBC: 7.4 10*3/uL (ref 3.8–10.6)

## 2014-01-18 LAB — VALPROIC ACID LEVEL: Valproic Acid: 3 ug/mL — ABNORMAL LOW

## 2014-01-19 LAB — GLUCOSE, RANDOM: GLUCOSE: 37 mg/dL — AB (ref 65–99)

## 2014-02-02 ENCOUNTER — Emergency Department: Payer: Self-pay | Admitting: Emergency Medicine

## 2014-02-02 LAB — CBC
HCT: 38.1 % — AB (ref 40.0–52.0)
HGB: 12.7 g/dL — AB (ref 13.0–18.0)
MCH: 28.5 pg (ref 26.0–34.0)
MCHC: 33.3 g/dL (ref 32.0–36.0)
MCV: 86 fL (ref 80–100)
Platelet: 178 10*3/uL (ref 150–440)
RBC: 4.45 10*6/uL (ref 4.40–5.90)
RDW: 17.6 % — ABNORMAL HIGH (ref 11.5–14.5)
WBC: 4.9 10*3/uL (ref 3.8–10.6)

## 2014-02-02 LAB — URINALYSIS, COMPLETE
BILIRUBIN, UR: NEGATIVE
Bacteria: NONE SEEN
Blood: NEGATIVE
Glucose,UR: NEGATIVE mg/dL (ref 0–75)
Ketone: NEGATIVE
Leukocyte Esterase: NEGATIVE
Nitrite: NEGATIVE
Ph: 7 (ref 4.5–8.0)
Protein: NEGATIVE
RBC,UR: NONE SEEN /HPF (ref 0–5)
SQUAMOUS EPITHELIAL: NONE SEEN
Specific Gravity: 1.004 (ref 1.003–1.030)
WBC UR: NONE SEEN /HPF (ref 0–5)

## 2014-02-02 LAB — DRUG SCREEN, URINE
Amphetamines, Ur Screen: NEGATIVE (ref ?–1000)
BENZODIAZEPINE, UR SCRN: NEGATIVE (ref ?–200)
Barbiturates, Ur Screen: NEGATIVE (ref ?–200)
CANNABINOID 50 NG, UR ~~LOC~~: NEGATIVE (ref ?–50)
Cocaine Metabolite,Ur ~~LOC~~: NEGATIVE (ref ?–300)
MDMA (ECSTASY) UR SCREEN: NEGATIVE (ref ?–500)
Methadone, Ur Screen: NEGATIVE (ref ?–300)
Opiate, Ur Screen: NEGATIVE (ref ?–300)
Phencyclidine (PCP) Ur S: NEGATIVE (ref ?–25)
Tricyclic, Ur Screen: NEGATIVE (ref ?–1000)

## 2014-02-02 LAB — COMPREHENSIVE METABOLIC PANEL
Albumin: 3.8 g/dL (ref 3.4–5.0)
Alkaline Phosphatase: 97 U/L
Anion Gap: 4 — ABNORMAL LOW (ref 7–16)
BUN: 3 mg/dL — ABNORMAL LOW (ref 7–18)
Bilirubin,Total: 0.2 mg/dL (ref 0.2–1.0)
Calcium, Total: 8.7 mg/dL (ref 8.5–10.1)
Chloride: 105 mmol/L (ref 98–107)
Co2: 29 mmol/L (ref 21–32)
Creatinine: 0.79 mg/dL (ref 0.60–1.30)
EGFR (African American): >60
EGFR (Non-African Amer.): >60
Glucose: 109 mg/dL — ABNORMAL HIGH (ref 65–99)
Osmolality: 273 (ref 275–301)
Potassium: 3.5 mmol/L (ref 3.5–5.1)
SGOT(AST): 16 U/L (ref 15–37)
SGPT (ALT): 15 U/L (ref 12–78)
Sodium: 138 mmol/L (ref 136–145)
Total Protein: 7 g/dL (ref 6.4–8.2)

## 2014-02-02 LAB — ETHANOL: Ethanol: <3 mg/dL

## 2014-02-02 LAB — ACETAMINOPHEN LEVEL: Acetaminophen: <2 ug/mL

## 2014-02-02 LAB — SALICYLATE LEVEL: Salicylates, Serum: 2.2 mg/dL

## 2014-04-01 IMAGING — CR DG ANKLE COMPLETE 3+V*L*
1 series · 5 of 5 positions shown · non-contrast
Comparison: none

REASON FOR EXAM: fell down stairs
COMMENTS:   LMP: (Male)

PROCEDURE:     MDR - MDR ANKLE LEFT COMPLETE  - February 25, 2012  [DATE]
RESULT:     Comparison: None.

[Series 1: ap · 0.17mm/px · 5 of 5 slices shown]
[im 1/5]
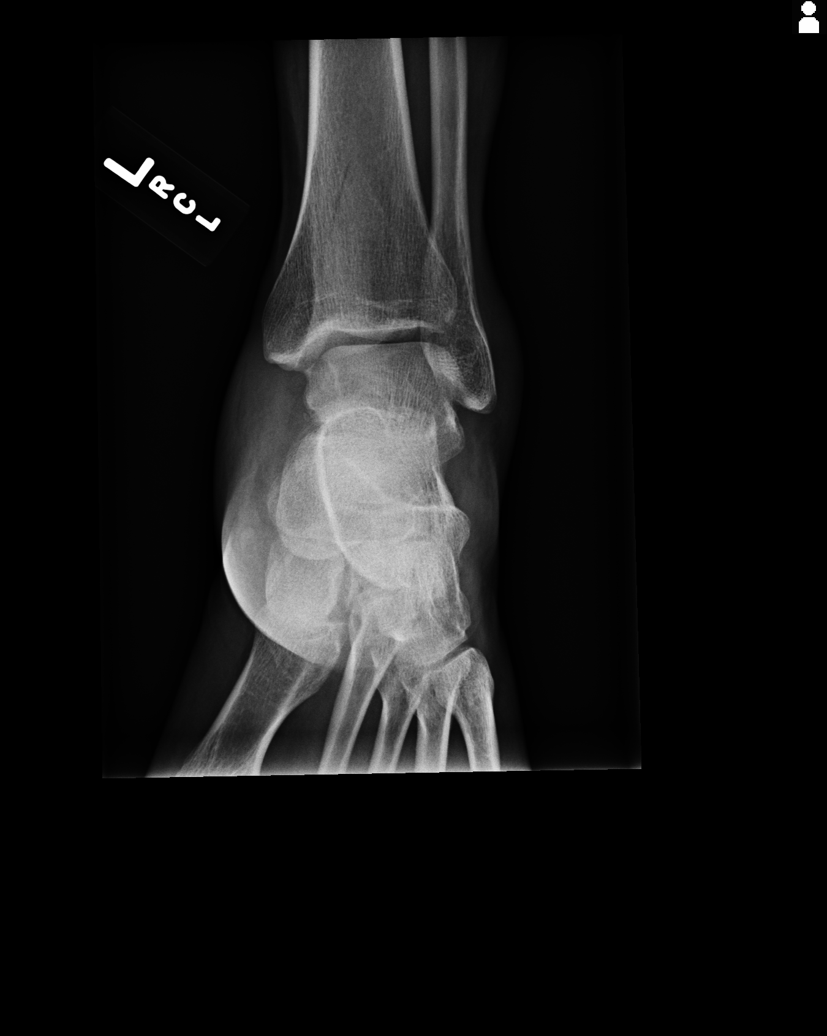
[im 2/5]
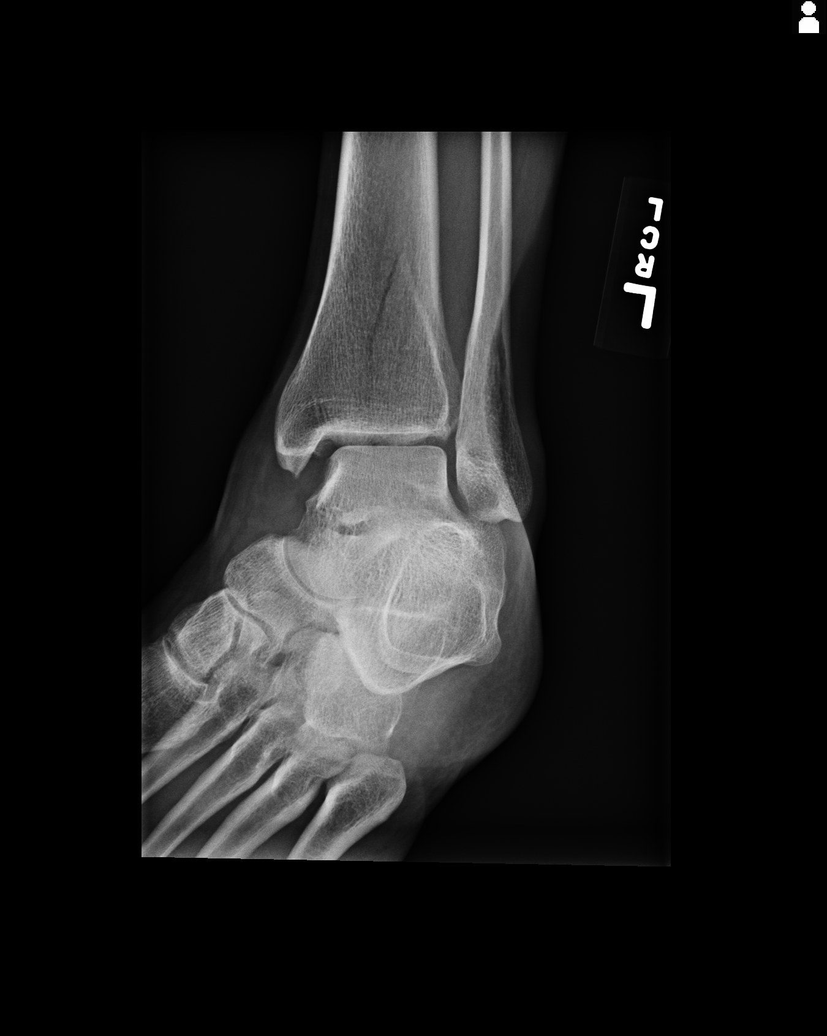
[im 3/5]
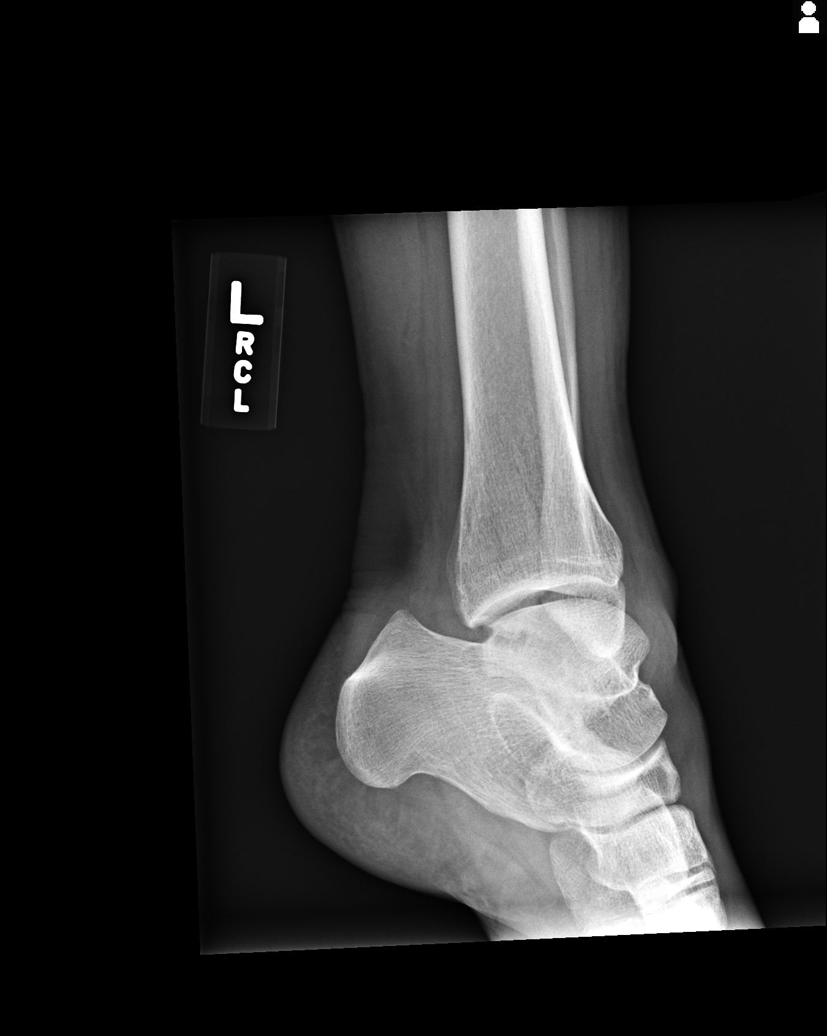
[im 4/5]
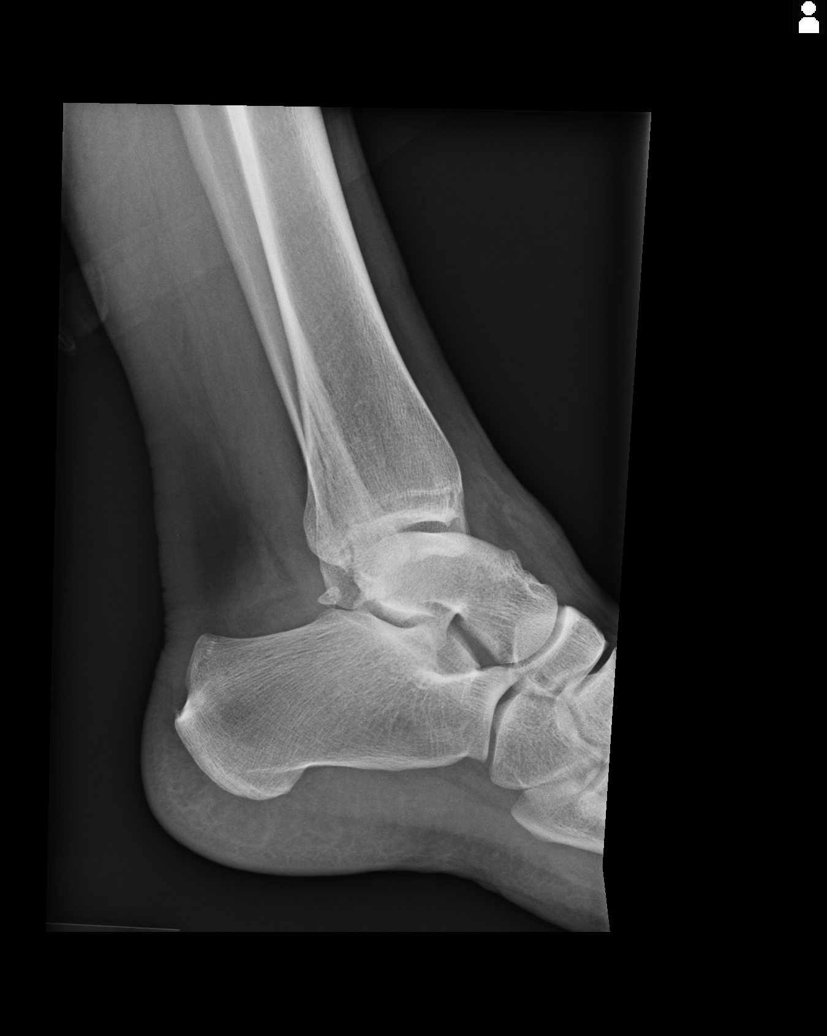
[im 5/5]
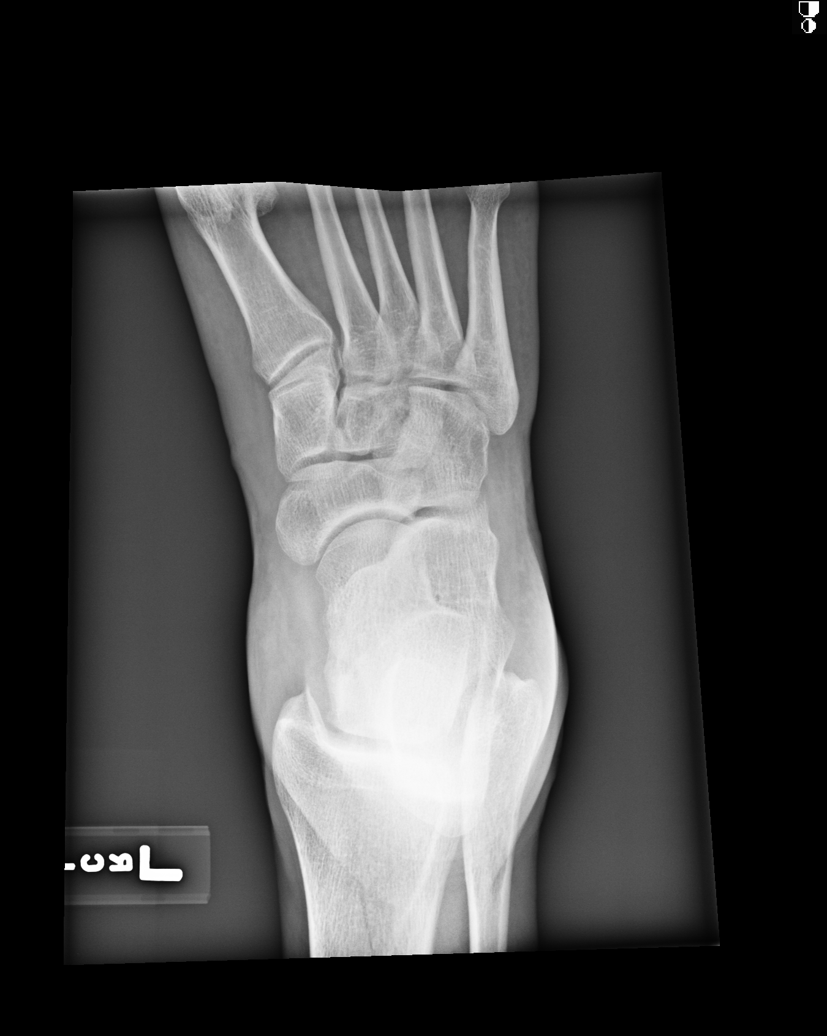

[5 of 5 positions shown; findings below may reference images not displayed]

FINDINGS: There is a linear lucency involving the posterior tibia. This is best
appreciated on the lateral and oblique views. Note is made of an os trigonum.
IMPRESSION: Minimally displaced fracture of the posterior malleolus of the distal tibia.

Clinician aware of fracture at time of dictation.

## 2014-04-01 IMAGING — CR DG FOOT COMPLETE 3+V*L*
1 series · 3 of 3 positions shown · non-contrast
Comparison: none

REASON FOR EXAM: fell off stairs; + swelling
COMMENTS:   LMP: (Male)

PROCEDURE:     MDR - MDR FOOT LT COMP W/OBLQUES  - February 25, 2012  [DATE]
RESULT:     Comparison: None.

[Series 1: ap · 0.17mm/px · 3 of 3 slices shown]
[im 1/3]
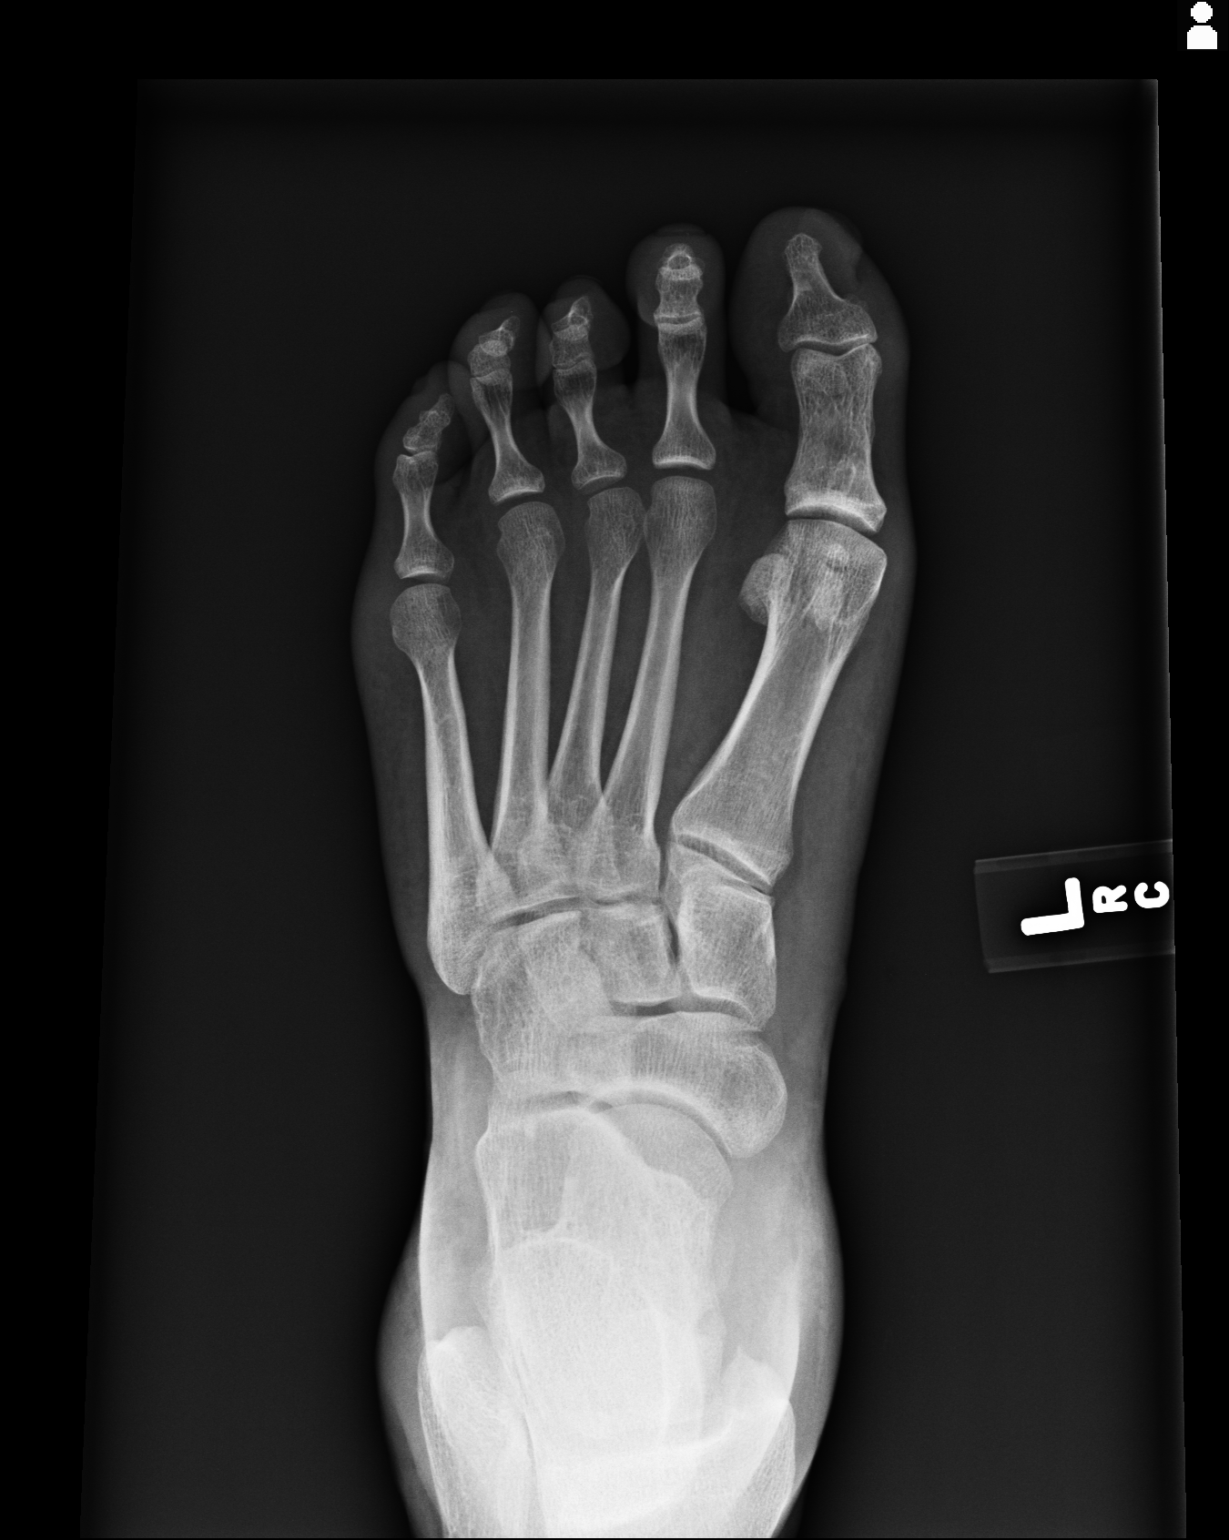
[im 2/3]
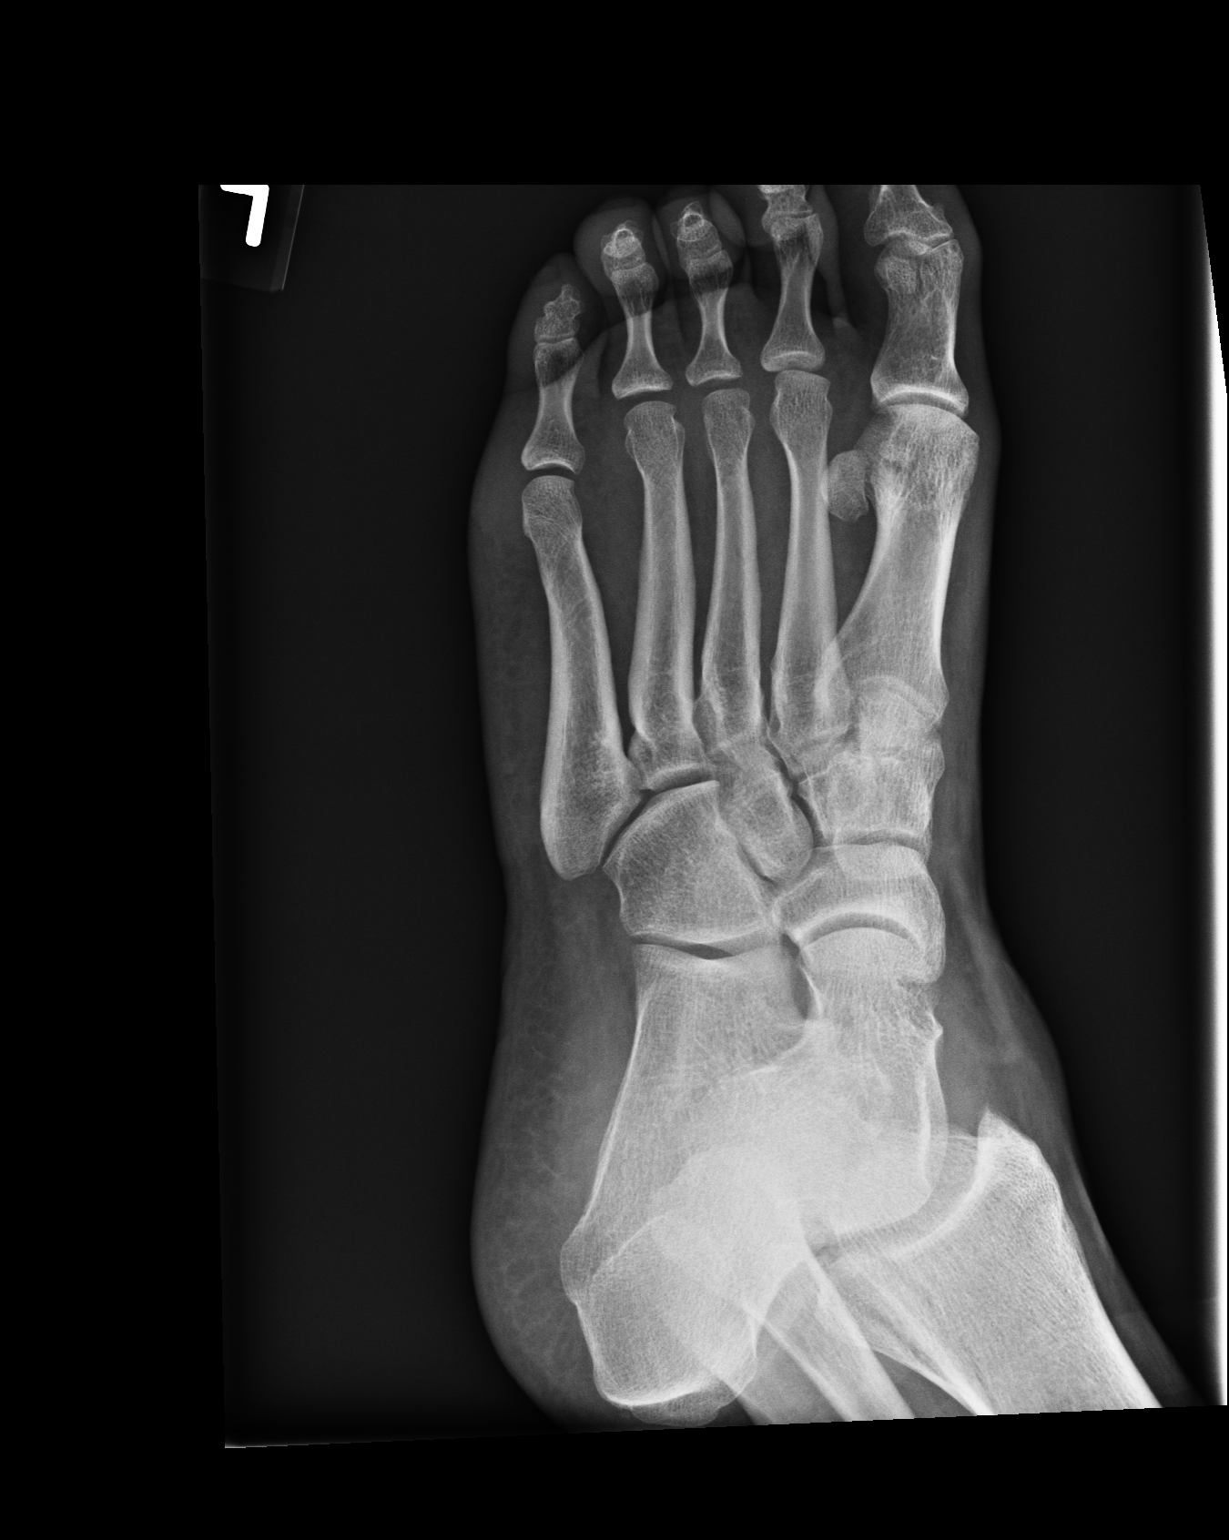
[im 3/3]
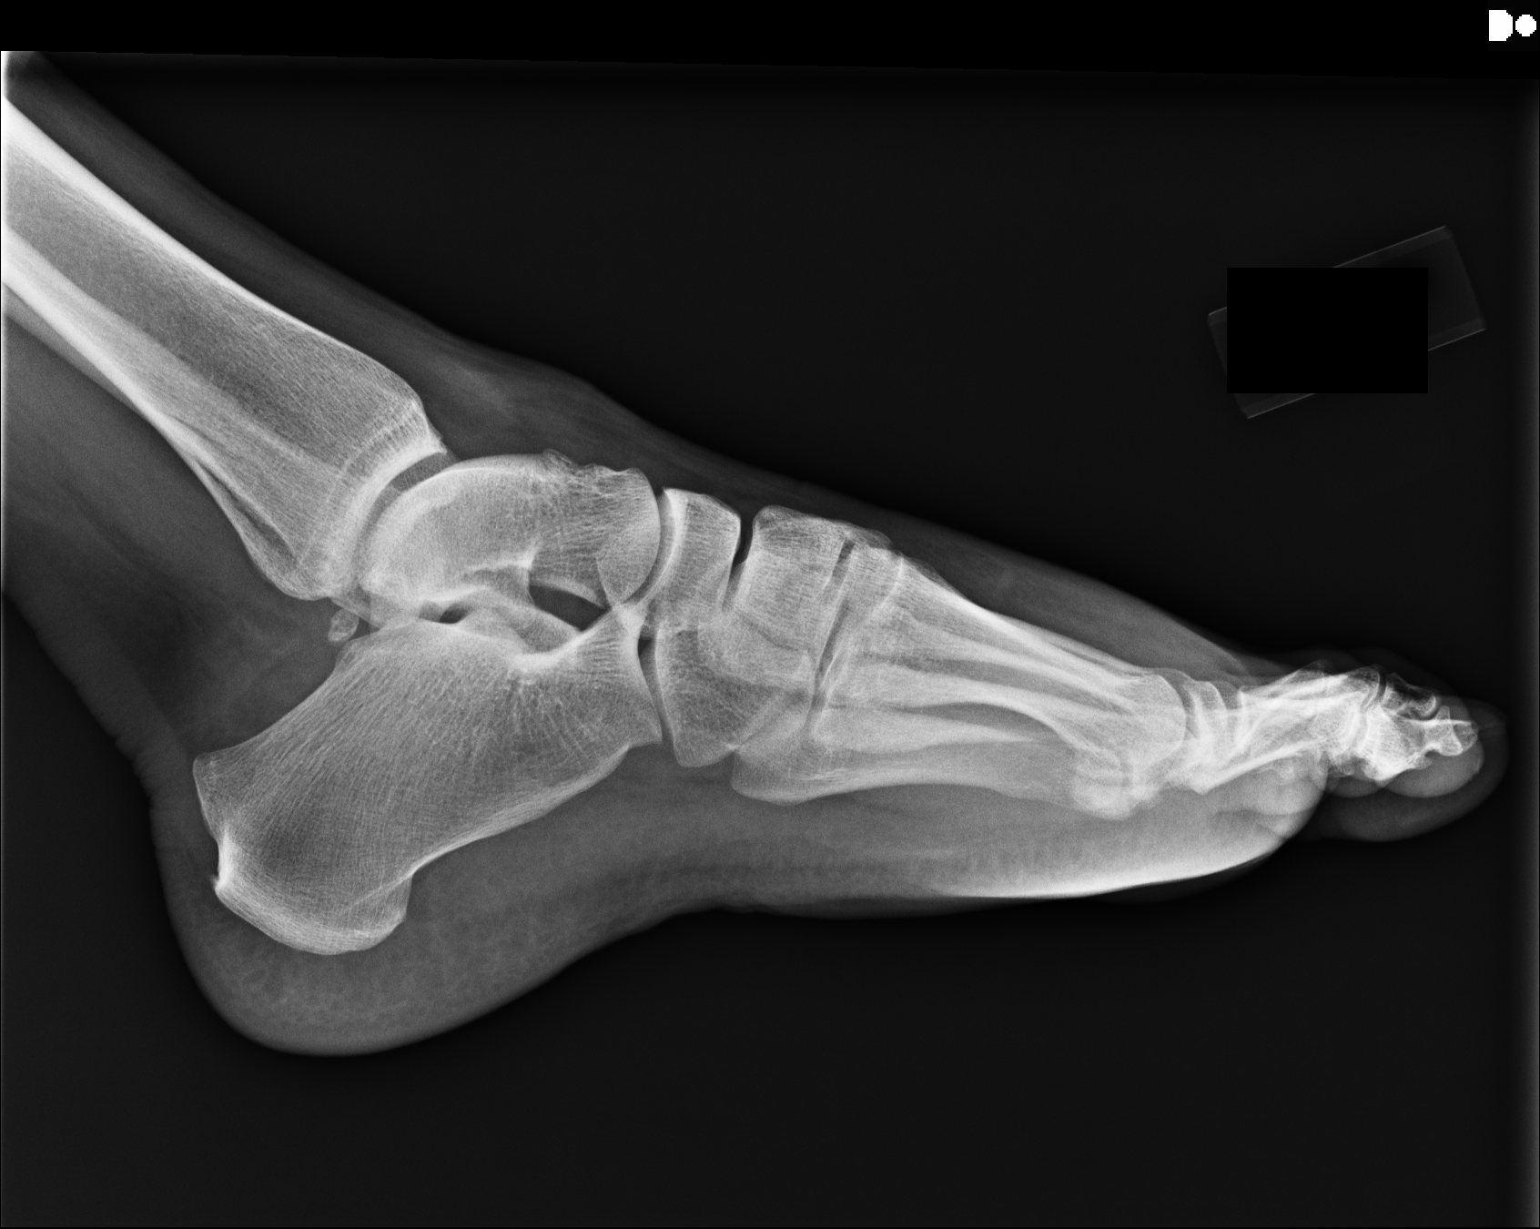

[3 of 3 positions shown; findings below may reference images not displayed]

FINDINGS: No acute fracture. Normal alignment. Joint spaces are maintained.
IMPRESSION: No acute fracture.

## 2014-04-30 IMAGING — CR DG ANKLE COMPLETE 3+V*L*
1 series · 5 of 5 positions shown · non-contrast
Comparison: none

REASON FOR EXAM: Left ankle pain due to reinjury
COMMENTS:

[Series 1: ap · 0.17mm/px · 5 of 5 slices shown]
[im 1/5]
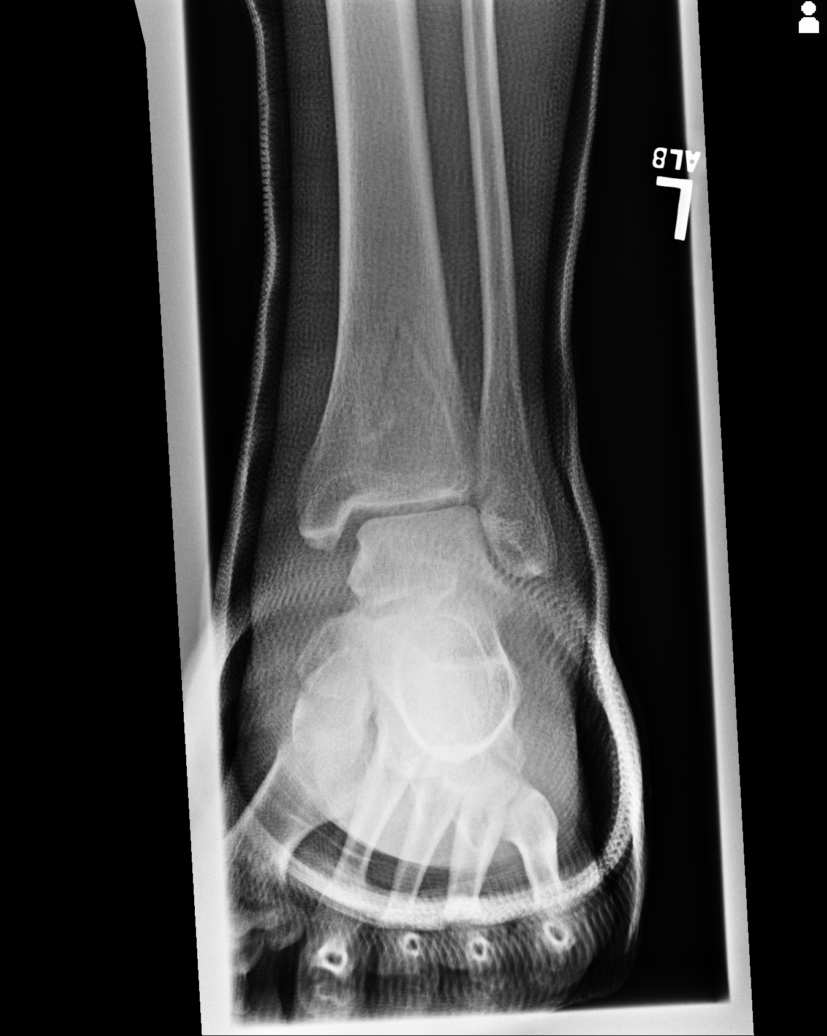
[im 2/5]
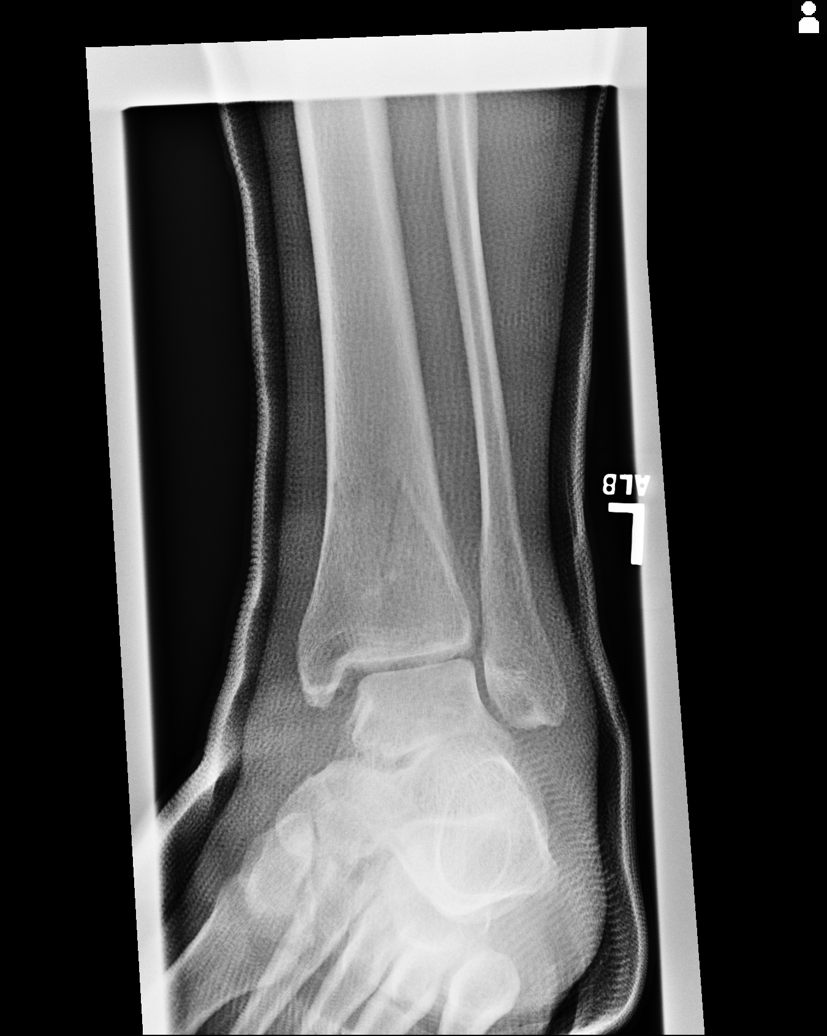
[im 3/5]
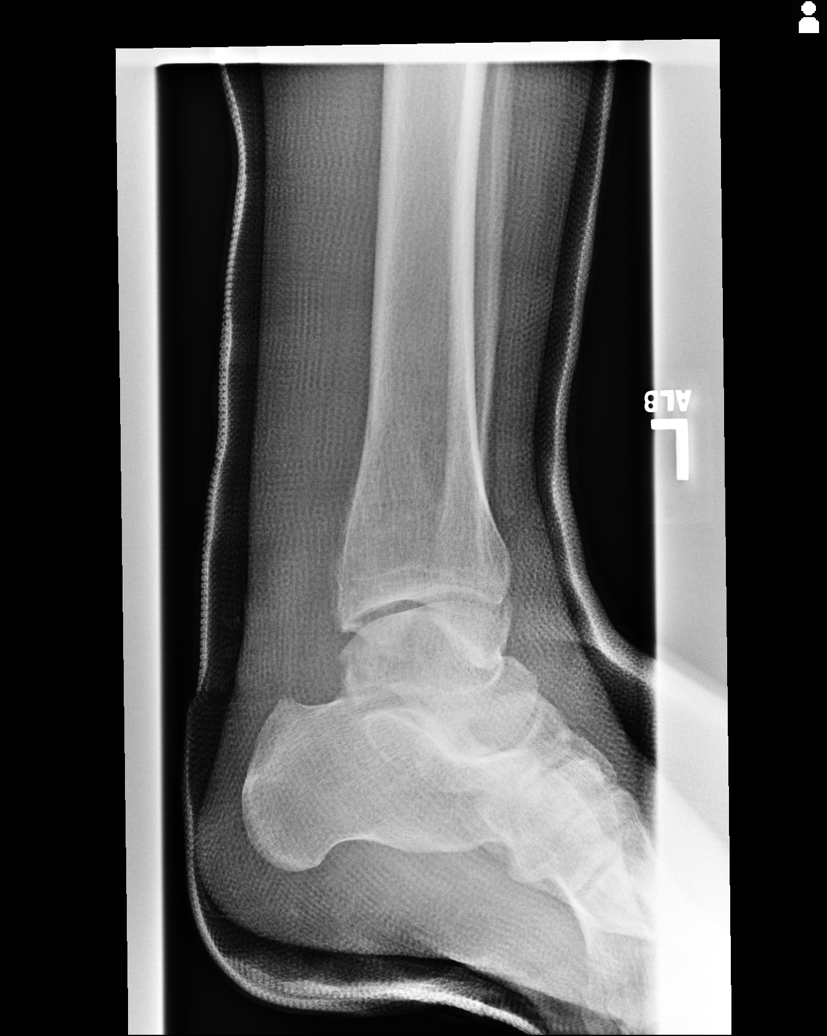
[im 4/5]
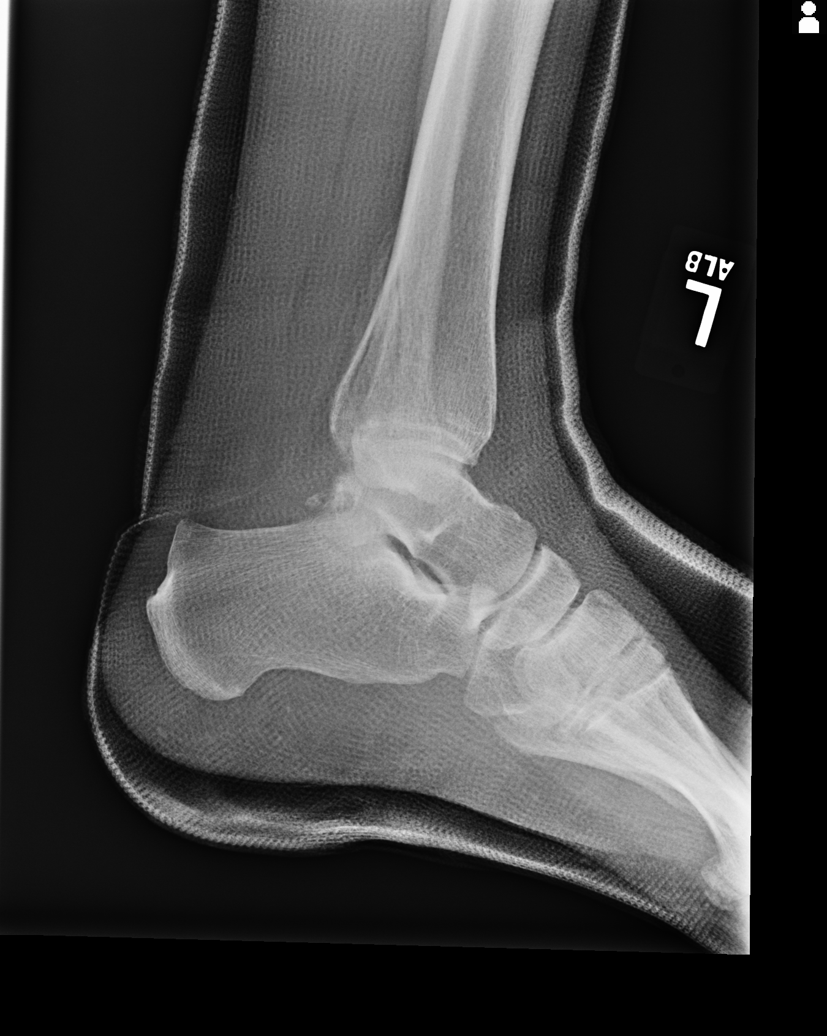
[im 5/5]
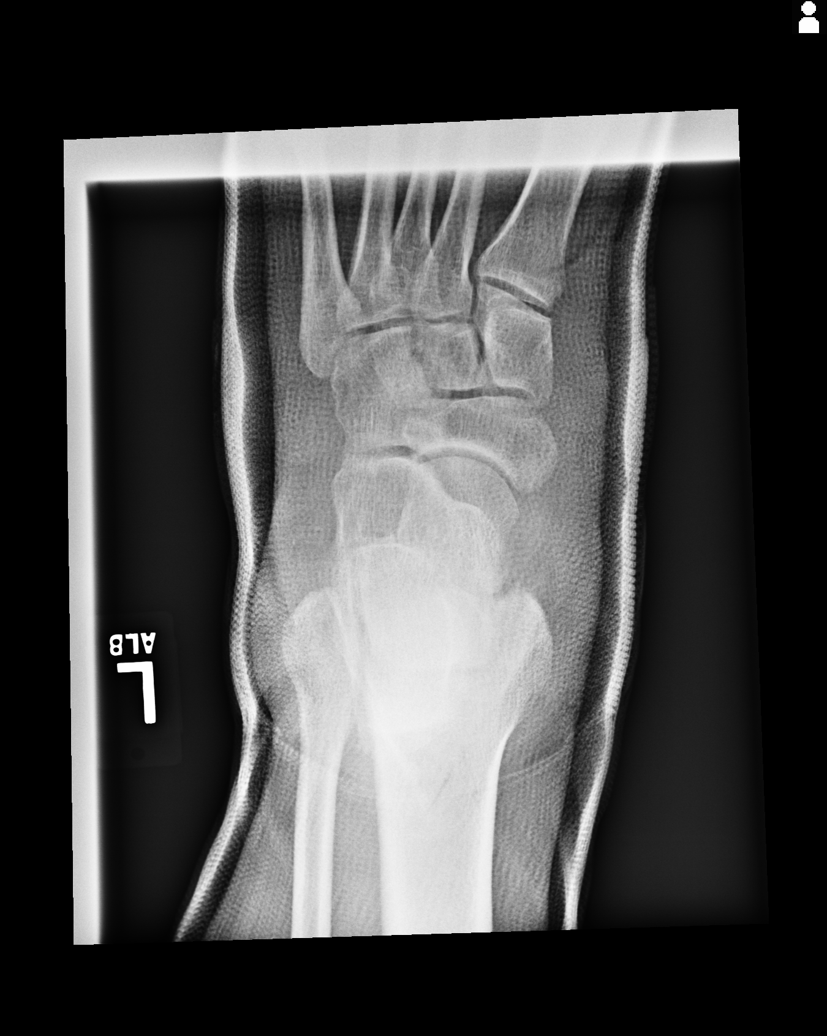

[5 of 5 positions shown; findings below may reference images not displayed]

PROCEDURE:  [REDACTED] 4445-MDR ANKLE LEFT COMPLETE  March 25, 2012  [DATE]

RESULT:

Comparison is made to a prior study dated 02/25/2012.

The study is degraded by overlying casting material. A prior distal tibial
fracture is identified. The ankle mortise is intact. There is no gross
evidence of further fracture or dislocation.
IMPRESSION: Previous left tibial fracture as described above without
evidence of acute abnormalities.

## 2014-05-29 ENCOUNTER — Inpatient Hospital Stay: Payer: Self-pay | Admitting: Psychiatry

## 2014-05-29 LAB — COMPREHENSIVE METABOLIC PANEL
ALK PHOS: 71 U/L
Albumin: 4.1 g/dL (ref 3.4–5.0)
Anion Gap: 12 (ref 7–16)
BILIRUBIN TOTAL: 0.5 mg/dL (ref 0.2–1.0)
BUN: 2 mg/dL — ABNORMAL LOW (ref 7–18)
CALCIUM: 8.7 mg/dL (ref 8.5–10.1)
CHLORIDE: 100 mmol/L (ref 98–107)
Co2: 22 mmol/L (ref 21–32)
Creatinine: 0.99 mg/dL (ref 0.60–1.30)
EGFR (African American): >60
EGFR (Non-African Amer.): >60
GLUCOSE: 84 mg/dL (ref 65–99)
Osmolality: 264 (ref 275–301)
Potassium: 3 mmol/L — ABNORMAL LOW (ref 3.5–5.1)
SGOT(AST): 31 U/L (ref 15–37)
SGPT (ALT): 27 U/L (ref 12–78)
Sodium: 134 mmol/L — ABNORMAL LOW (ref 136–145)
Total Protein: 7.7 g/dL (ref 6.4–8.2)

## 2014-05-29 LAB — CBC
HCT: 42.7 % (ref 40.0–52.0)
HGB: 14.1 g/dL (ref 13.0–18.0)
MCH: 29.2 pg (ref 26.0–34.0)
MCHC: 33 g/dL (ref 32.0–36.0)
MCV: 89 fL (ref 80–100)
Platelet: 220 10*3/uL (ref 150–440)
RBC: 4.82 10*6/uL (ref 4.40–5.90)
RDW: 15.7 % — AB (ref 11.5–14.5)
WBC: 6.1 10*3/uL (ref 3.8–10.6)

## 2014-05-29 LAB — URINALYSIS, COMPLETE
BACTERIA: NONE SEEN
BLOOD: NEGATIVE
Bilirubin,UR: NEGATIVE
Glucose,UR: NEGATIVE mg/dL (ref 0–75)
Ketone: NEGATIVE
Leukocyte Esterase: NEGATIVE
NITRITE: NEGATIVE
PROTEIN: NEGATIVE
Ph: 6 (ref 4.5–8.0)
RBC,UR: NONE SEEN /HPF (ref 0–5)
SPECIFIC GRAVITY: 1.001 (ref 1.003–1.030)
Squamous Epithelial: NONE SEEN
WBC UR: NONE SEEN /HPF (ref 0–5)

## 2014-05-29 LAB — ACETAMINOPHEN LEVEL

## 2014-05-29 LAB — DRUG SCREEN, URINE
Amphetamines, Ur Screen: NEGATIVE (ref ?–1000)
Barbiturates, Ur Screen: NEGATIVE (ref ?–200)
Benzodiazepine, Ur Scrn: POSITIVE (ref ?–200)
Cannabinoid 50 Ng, Ur ~~LOC~~: NEGATIVE (ref ?–50)
Cocaine Metabolite,Ur ~~LOC~~: NEGATIVE (ref ?–300)
MDMA (Ecstasy)Ur Screen: POSITIVE (ref ?–500)
METHADONE, UR SCREEN: NEGATIVE (ref ?–300)
Opiate, Ur Screen: NEGATIVE (ref ?–300)
Phencyclidine (PCP) Ur S: NEGATIVE (ref ?–25)
Tricyclic, Ur Screen: NEGATIVE (ref ?–1000)

## 2014-05-29 LAB — ETHANOL
ETHANOL %: 0.053 % (ref 0.000–0.080)
Ethanol: 53 mg/dL

## 2014-05-29 LAB — VALPROIC ACID LEVEL: Valproic Acid: <3 ug/mL — ABNORMAL LOW

## 2014-05-29 LAB — SALICYLATE LEVEL: Salicylates, Serum: 3.1 mg/dL — ABNORMAL HIGH

## 2014-06-01 LAB — LIPID PANEL
Cholesterol: 127 mg/dL (ref 0–200)
HDL: 39 mg/dL — AB (ref 40–60)
Ldl Cholesterol, Calc: 63 mg/dL (ref 0–100)
Triglycerides: 123 mg/dL (ref 0–200)
VLDL Cholesterol, Calc: 25 mg/dL (ref 5–40)

## 2014-06-01 LAB — HEMOGLOBIN A1C: Hemoglobin A1C: 5.3 % (ref 4.2–6.3)

## 2014-06-14 ENCOUNTER — Emergency Department: Payer: Self-pay | Admitting: Emergency Medicine

## 2014-06-14 LAB — CBC
HCT: 41.9 % (ref 40.0–52.0)
HGB: 14.5 g/dL (ref 13.0–18.0)
MCH: 30.9 pg (ref 26.0–34.0)
MCHC: 34.6 g/dL (ref 32.0–36.0)
MCV: 89 fL (ref 80–100)
Platelet: 236 10*3/uL (ref 150–440)
RBC: 4.69 10*6/uL (ref 4.40–5.90)
RDW: 15 % — ABNORMAL HIGH (ref 11.5–14.5)
WBC: 5 10*3/uL (ref 3.8–10.6)

## 2014-06-14 LAB — URINALYSIS, COMPLETE
BACTERIA: NONE SEEN
BILIRUBIN, UR: NEGATIVE
Blood: NEGATIVE
GLUCOSE, UR: NEGATIVE mg/dL (ref 0–75)
Ketone: NEGATIVE
LEUKOCYTE ESTERASE: NEGATIVE
Nitrite: NEGATIVE
PROTEIN: NEGATIVE
Ph: 7 (ref 4.5–8.0)
RBC,UR: NONE SEEN /HPF (ref 0–5)
Specific Gravity: 1.004 (ref 1.003–1.030)
Squamous Epithelial: NONE SEEN
WBC UR: 1 /HPF (ref 0–5)

## 2014-06-14 LAB — COMPREHENSIVE METABOLIC PANEL
ALT: 18 U/L
ANION GAP: 10 (ref 7–16)
Albumin: 3.2 g/dL — ABNORMAL LOW (ref 3.4–5.0)
Alkaline Phosphatase: 55 U/L
BUN: <1 mg/dL — ABNORMAL LOW (ref 7–18)
Bilirubin,Total: 0.4 mg/dL (ref 0.2–1.0)
CHLORIDE: 96 mmol/L — AB (ref 98–107)
CREATININE: 0.96 mg/dL (ref 0.60–1.30)
Calcium, Total: 8.1 mg/dL — ABNORMAL LOW (ref 8.5–10.1)
Co2: 25 mmol/L (ref 21–32)
EGFR (African American): >60
EGFR (Non-African Amer.): >60
Glucose: 100 mg/dL — ABNORMAL HIGH (ref 65–99)
Potassium: 3.1 mmol/L — ABNORMAL LOW (ref 3.5–5.1)
SGOT(AST): 13 U/L — ABNORMAL LOW (ref 15–37)
Sodium: 131 mmol/L — ABNORMAL LOW (ref 136–145)
TOTAL PROTEIN: 6.3 g/dL — AB (ref 6.4–8.2)

## 2014-06-14 LAB — ETHANOL
Ethanol %: 0.086 % — ABNORMAL HIGH (ref 0.000–0.080)
Ethanol: 86 mg/dL

## 2014-06-14 LAB — DRUG SCREEN, URINE
Amphetamines, Ur Screen: NEGATIVE (ref ?–1000)
BARBITURATES, UR SCREEN: NEGATIVE (ref ?–200)
BENZODIAZEPINE, UR SCRN: NEGATIVE (ref ?–200)
Cannabinoid 50 Ng, Ur ~~LOC~~: NEGATIVE (ref ?–50)
Cocaine Metabolite,Ur ~~LOC~~: NEGATIVE (ref ?–300)
MDMA (ECSTASY) UR SCREEN: POSITIVE (ref ?–500)
Methadone, Ur Screen: NEGATIVE (ref ?–300)
OPIATE, UR SCREEN: POSITIVE (ref ?–300)
PHENCYCLIDINE (PCP) UR S: NEGATIVE (ref ?–25)
TRICYCLIC, UR SCREEN: NEGATIVE (ref ?–1000)

## 2014-06-14 LAB — SALICYLATE LEVEL: SALICYLATES, SERUM: 3.1 mg/dL — AB

## 2014-06-14 LAB — ACETAMINOPHEN LEVEL: ACETAMINOPHEN: 2 ug/mL — AB

## 2014-06-14 LAB — VALPROIC ACID LEVEL: Valproic Acid: 119 ug/mL — ABNORMAL HIGH

## 2014-06-15 LAB — AMMONIA: AMMONIA, PLASMA: 31 umol/L (ref 11–32)

## 2014-06-16 LAB — VALPROIC ACID LEVEL: Valproic Acid: 147 ug/mL — ABNORMAL HIGH

## 2014-07-04 IMAGING — CR DG ANKLE COMPLETE 3+V*L*
1 series · 5 of 5 positions shown · non-contrast
Comparison: none

REASON FOR EXAM: pain, trauma
COMMENTS:

[Series 1: x ankle ap left · 0.14mm/px · 5 of 5 slices shown]
[im 1/5]
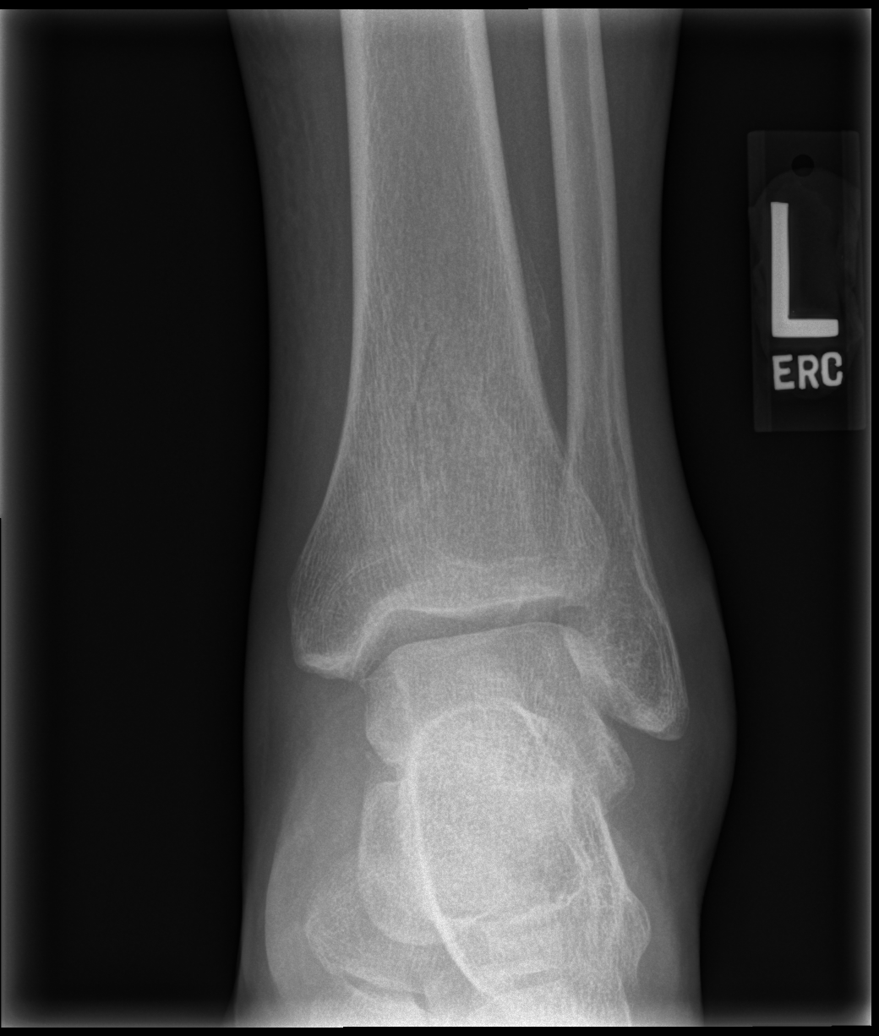
[im 2/5]
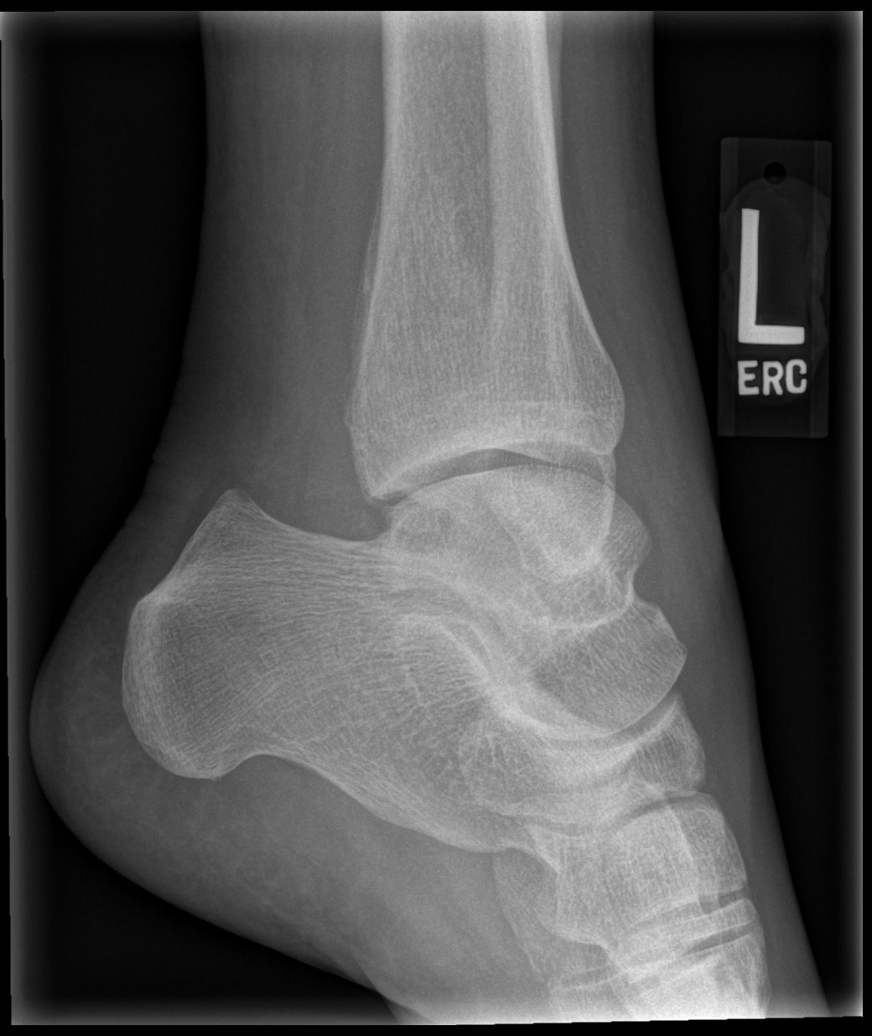
[im 3/5]
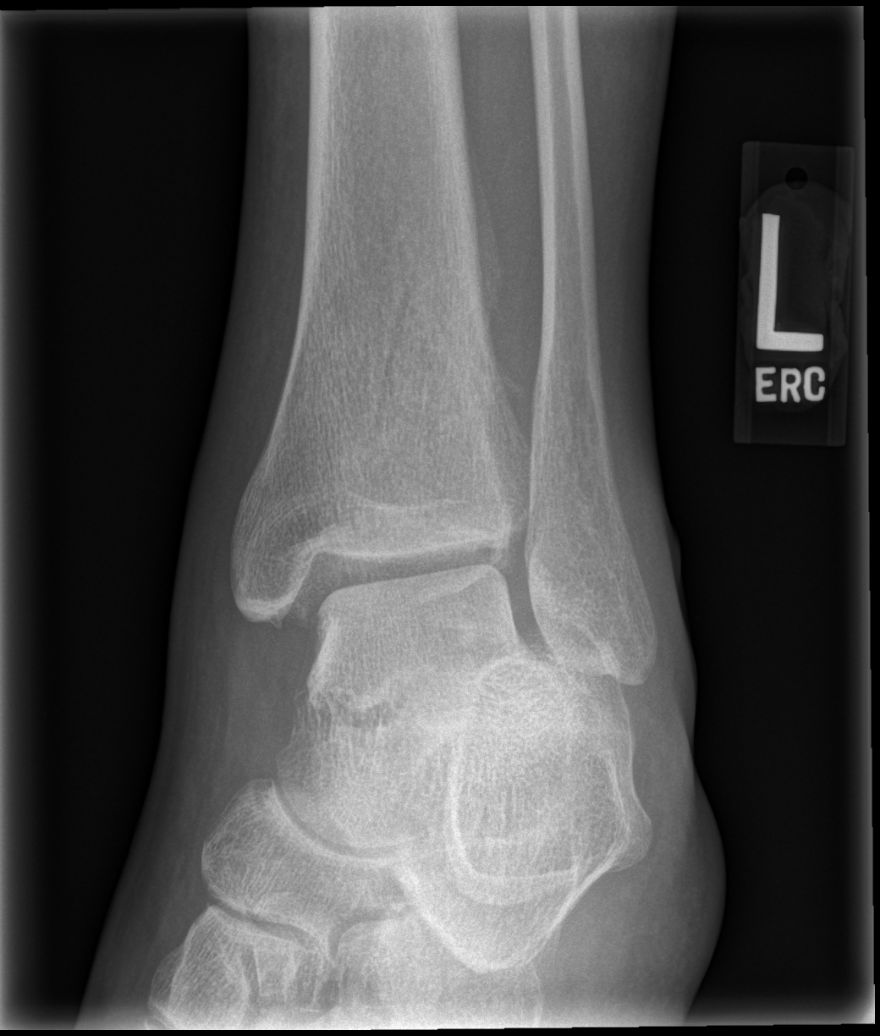
[im 4/5]
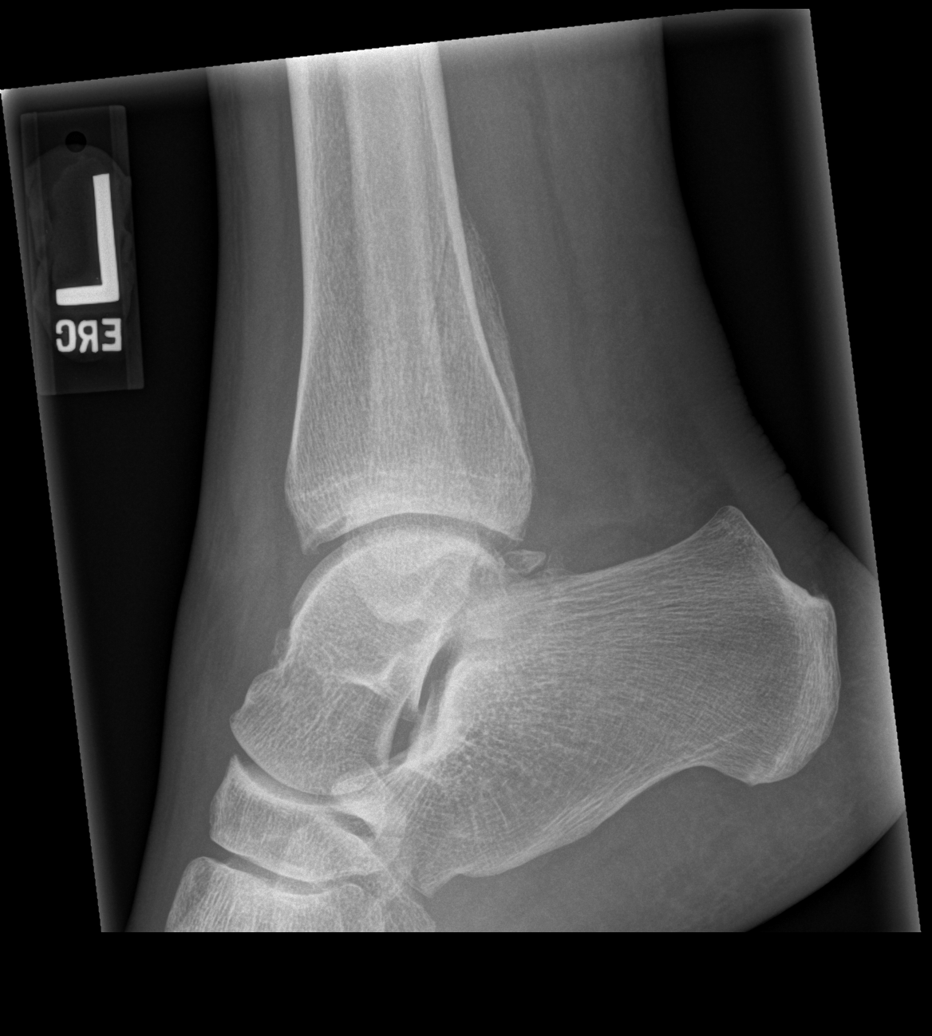
[im 5/5]
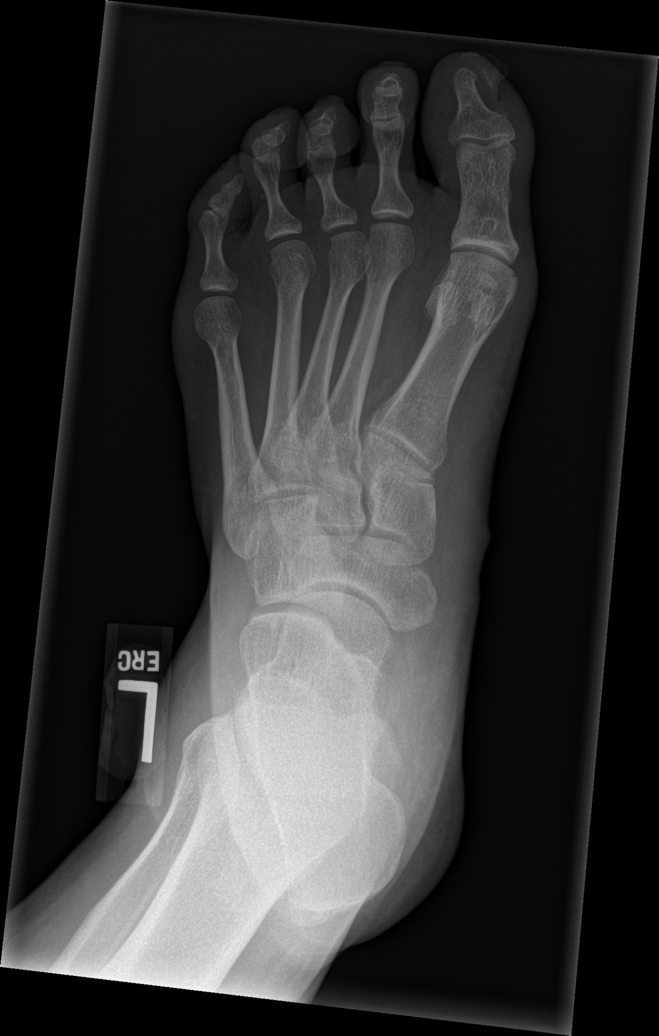

[5 of 5 positions shown; findings below may reference images not displayed]

PROCEDURE:     DXR - DXR ANKLE LEFT COMPLETE  - May 29, 2012 [DATE]

RESULT:     Five views of the left ankle reveal the joint mortise to be
reasonably well-maintained though possibly slightly widened medially. There
is a small spur from the to the medial malleolus. The tibial fracture that
has been previously described is still visible. There is a small amount of
periosteal reaction along the lateral aspect of the distal tibial
metaphysis. A small amount is seen posteriorly as well. The os trigonum is
mildly prominent. There is no evidence of an acute or old fracture.
IMPRESSION: There is no acute bony abnormality of the left ankle. The
tibial metaphyseal fracture lines remain visible. In addition there are mild
degenerative changes present. Given the chronicity of the patient's symptoms
and the reported erythema in today, followup MRI may be useful.

## 2014-07-08 IMAGING — CR RIGHT HAND - COMPLETE 3+ VIEW
1 series · 4 of 4 positions shown · non-contrast
Comparison: none

REASON FOR EXAM: punched Faxx
COMMENTS:   LMP: (Male)

[Series 1: pa · 0.17mm/px · 4 of 4 slices shown]
[im 1/4]
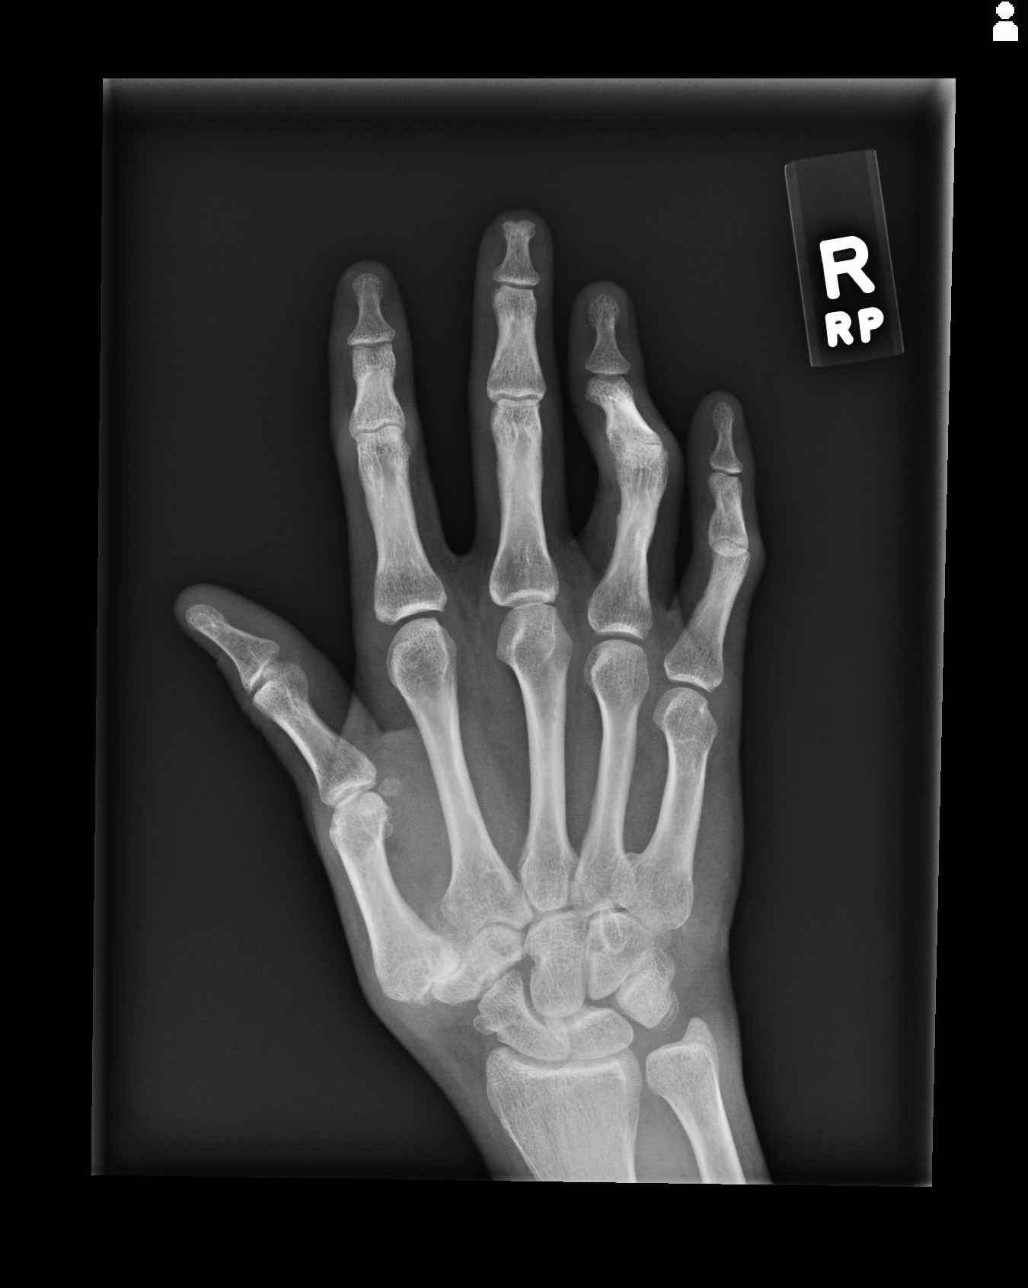
[im 2/4]
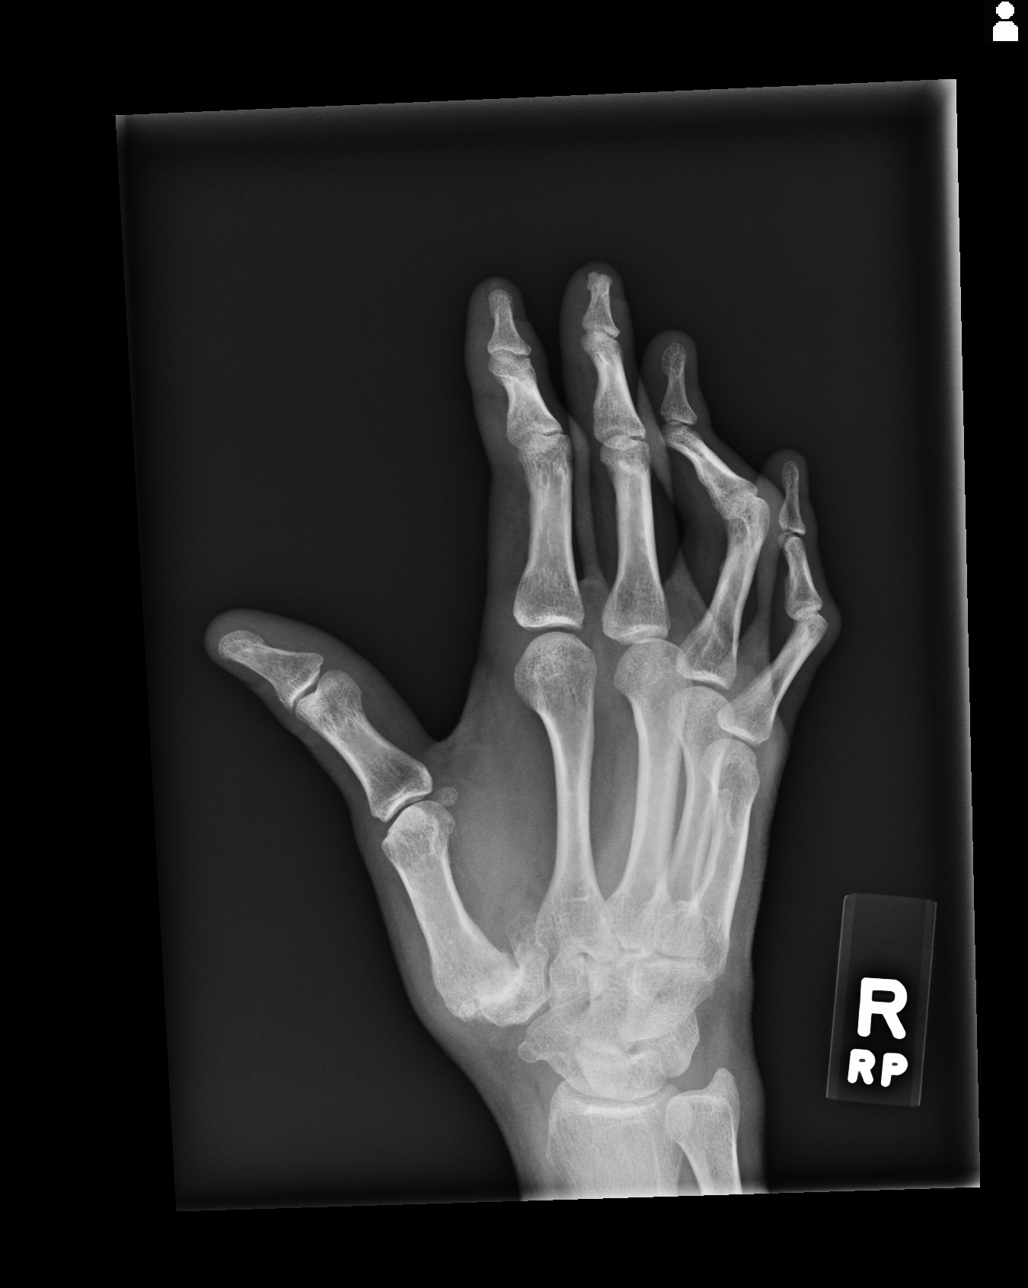
[im 3/4]
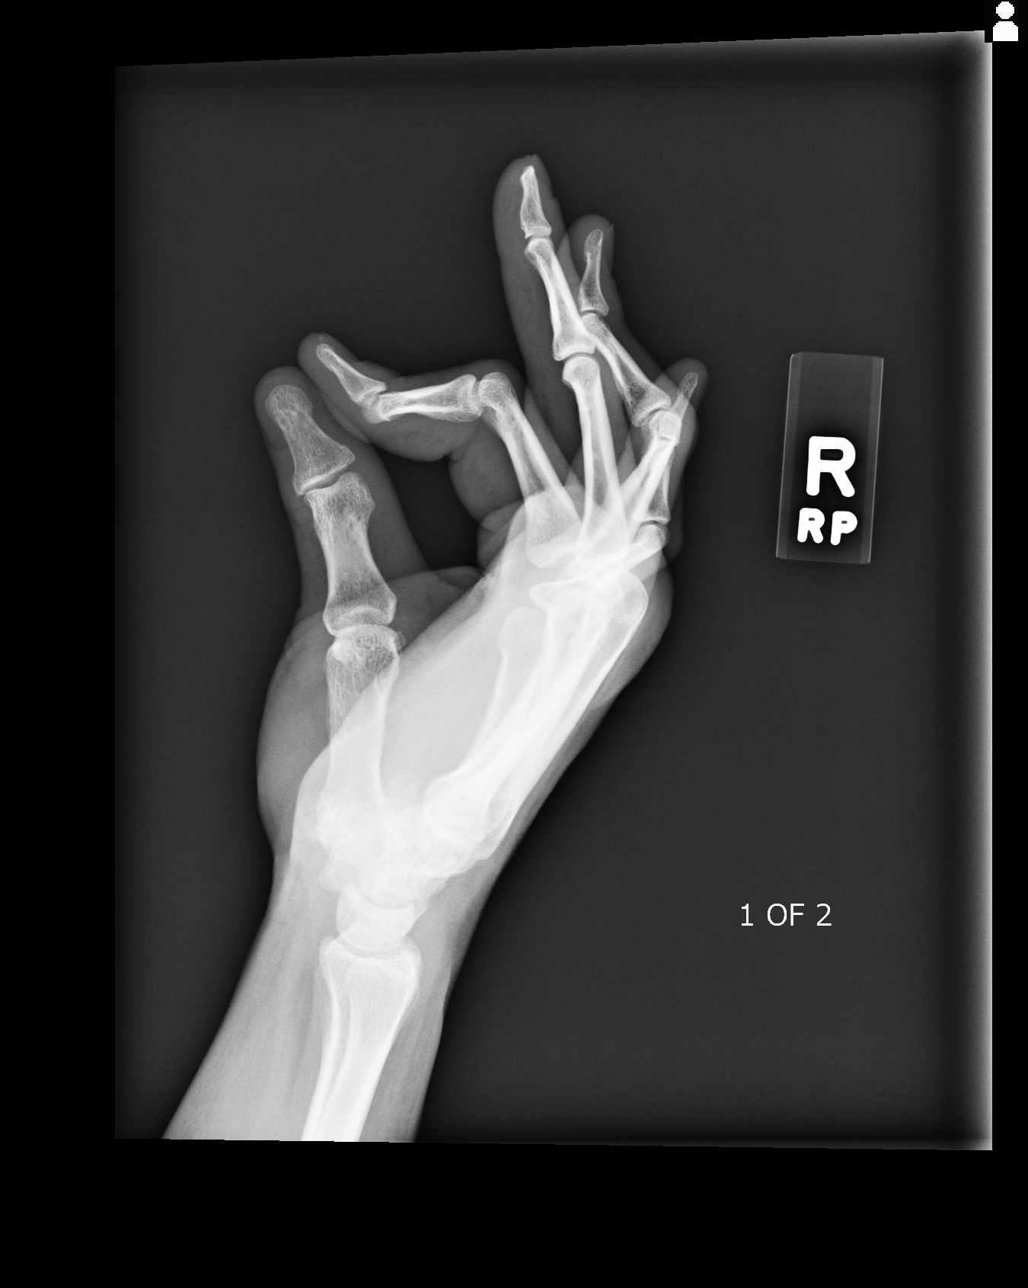
[im 4/4]
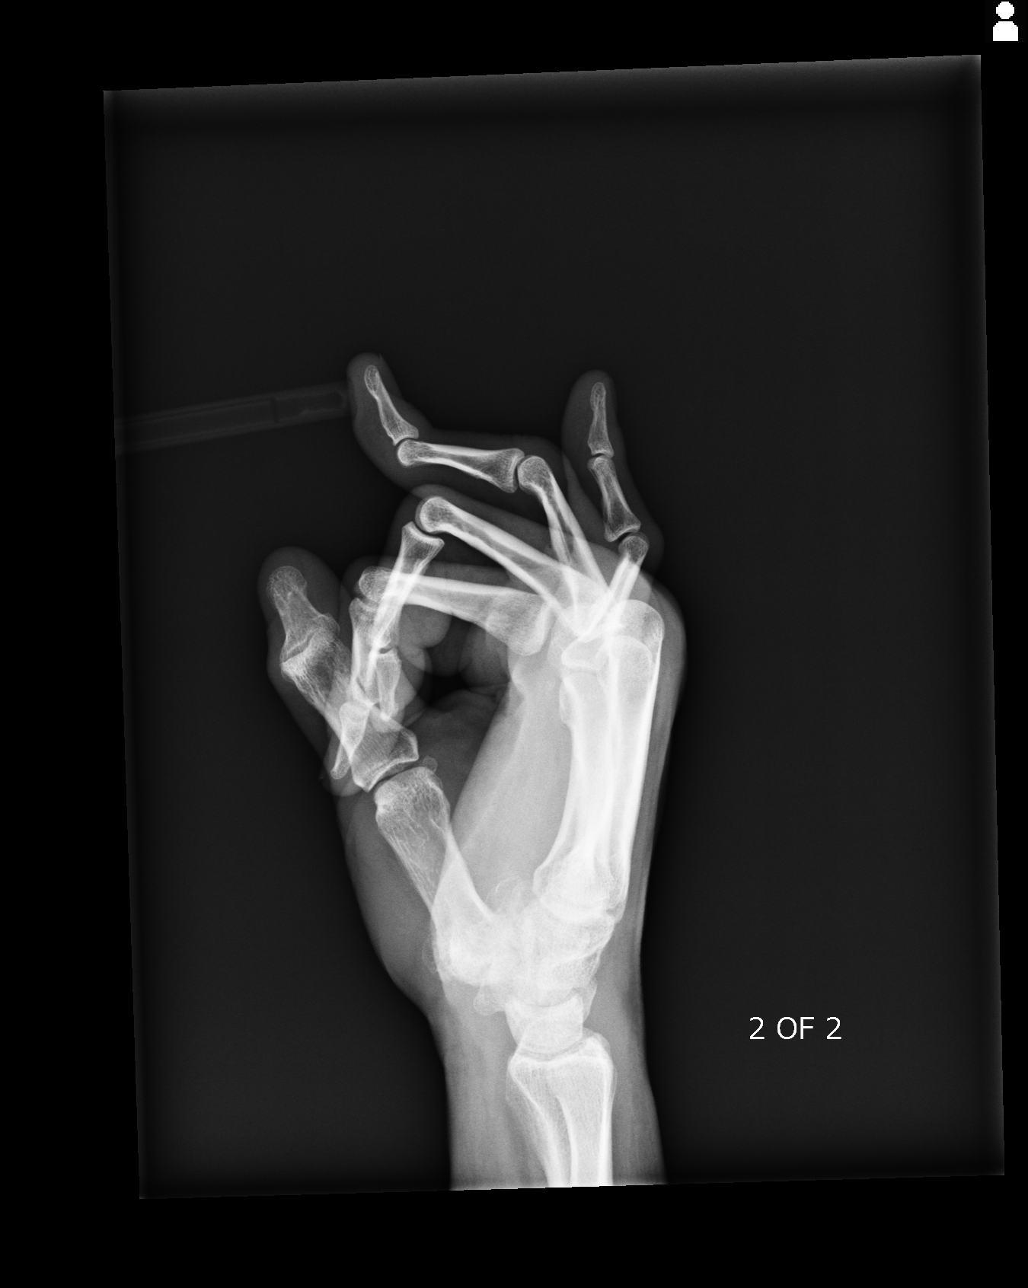

[4 of 4 positions shown; findings below may reference images not displayed]

PROCEDURE:     MDR - MDR HAND RT COMP W/OBLIQUES  - June 02, 2012  [DATE]

RESULT:     Comparison made to prior study dated 06/23/2010.

There is no evidence of fracture, or dislocation. Chronic contractures once
again appreciated. Osteophytic changes appreciated involving the first and
second carpal metacarpal articulation.
IMPRESSION: 1. No evidence of acute abnormalities.
2. If there are persistent complaints of pain or persistent clinical
concern, a repeat evaluation in 7-10 days is recommended if clinically
warranted.

## 2014-07-12 ENCOUNTER — Emergency Department: Payer: Self-pay | Admitting: Emergency Medicine

## 2014-07-13 LAB — URINALYSIS, COMPLETE
BACTERIA: NONE SEEN
BLOOD: NEGATIVE
Bilirubin,UR: NEGATIVE
Glucose,UR: NEGATIVE mg/dL (ref 0–75)
Ketone: NEGATIVE
Leukocyte Esterase: NEGATIVE
NITRITE: NEGATIVE
PH: 6 (ref 4.5–8.0)
Protein: NEGATIVE
RBC,UR: NONE SEEN /HPF (ref 0–5)
Specific Gravity: 1.002 (ref 1.003–1.030)
Squamous Epithelial: NONE SEEN
WBC UR: NONE SEEN /HPF (ref 0–5)

## 2014-07-13 LAB — COMPREHENSIVE METABOLIC PANEL
Albumin: 4 g/dL (ref 3.4–5.0)
Alkaline Phosphatase: 48 U/L
Anion Gap: 7 (ref 7–16)
BILIRUBIN TOTAL: 0.3 mg/dL (ref 0.2–1.0)
BUN: 3 mg/dL — ABNORMAL LOW (ref 7–18)
CALCIUM: 8.8 mg/dL (ref 8.5–10.1)
CO2: 26 mmol/L (ref 21–32)
CREATININE: 0.98 mg/dL (ref 0.60–1.30)
Chloride: 101 mmol/L (ref 98–107)
EGFR (African American): >60
GLUCOSE: 76 mg/dL (ref 65–99)
OSMOLALITY: 264 (ref 275–301)
POTASSIUM: 3.5 mmol/L (ref 3.5–5.1)
SGOT(AST): 13 U/L — ABNORMAL LOW (ref 15–37)
SGPT (ALT): 15 U/L
SODIUM: 134 mmol/L — AB (ref 136–145)
TOTAL PROTEIN: 7.4 g/dL (ref 6.4–8.2)

## 2014-07-13 LAB — CBC
HCT: 43.2 % (ref 40.0–52.0)
HGB: 14.6 g/dL (ref 13.0–18.0)
MCH: 30.2 pg (ref 26.0–34.0)
MCHC: 33.7 g/dL (ref 32.0–36.0)
MCV: 90 fL (ref 80–100)
Platelet: 158 10*3/uL (ref 150–440)
RBC: 4.82 10*6/uL (ref 4.40–5.90)
RDW: 14.5 % (ref 11.5–14.5)
WBC: 5.3 10*3/uL (ref 3.8–10.6)

## 2014-07-13 LAB — DRUG SCREEN, URINE
Amphetamines, Ur Screen: NEGATIVE (ref ?–1000)
Barbiturates, Ur Screen: NEGATIVE (ref ?–200)
Benzodiazepine, Ur Scrn: NEGATIVE (ref ?–200)
Cannabinoid 50 Ng, Ur ~~LOC~~: NEGATIVE (ref ?–50)
Cocaine Metabolite,Ur ~~LOC~~: NEGATIVE (ref ?–300)
MDMA (Ecstasy)Ur Screen: POSITIVE (ref ?–500)
Methadone, Ur Screen: NEGATIVE (ref ?–300)
OPIATE, UR SCREEN: NEGATIVE (ref ?–300)
PHENCYCLIDINE (PCP) UR S: NEGATIVE (ref ?–25)
TRICYCLIC, UR SCREEN: POSITIVE (ref ?–1000)

## 2014-07-13 LAB — ETHANOL: Ethanol %: <0.003 % (ref 0.000–0.080)

## 2014-07-13 LAB — SALICYLATE LEVEL: Salicylates, Serum: 2.6 mg/dL

## 2014-07-13 LAB — ACETAMINOPHEN LEVEL: Acetaminophen: <2 ug/mL

## 2014-07-15 IMAGING — CR RIGHT HAND - COMPLETE 3+ VIEW
1 series · 3 of 3 positions shown · non-contrast
Comparison: none

REASON FOR EXAM: portable please- right 4th digit injury
COMMENTS:

[Series 1: pa · 0.17mm/px · 3 of 3 slices shown]
[im 1/3]
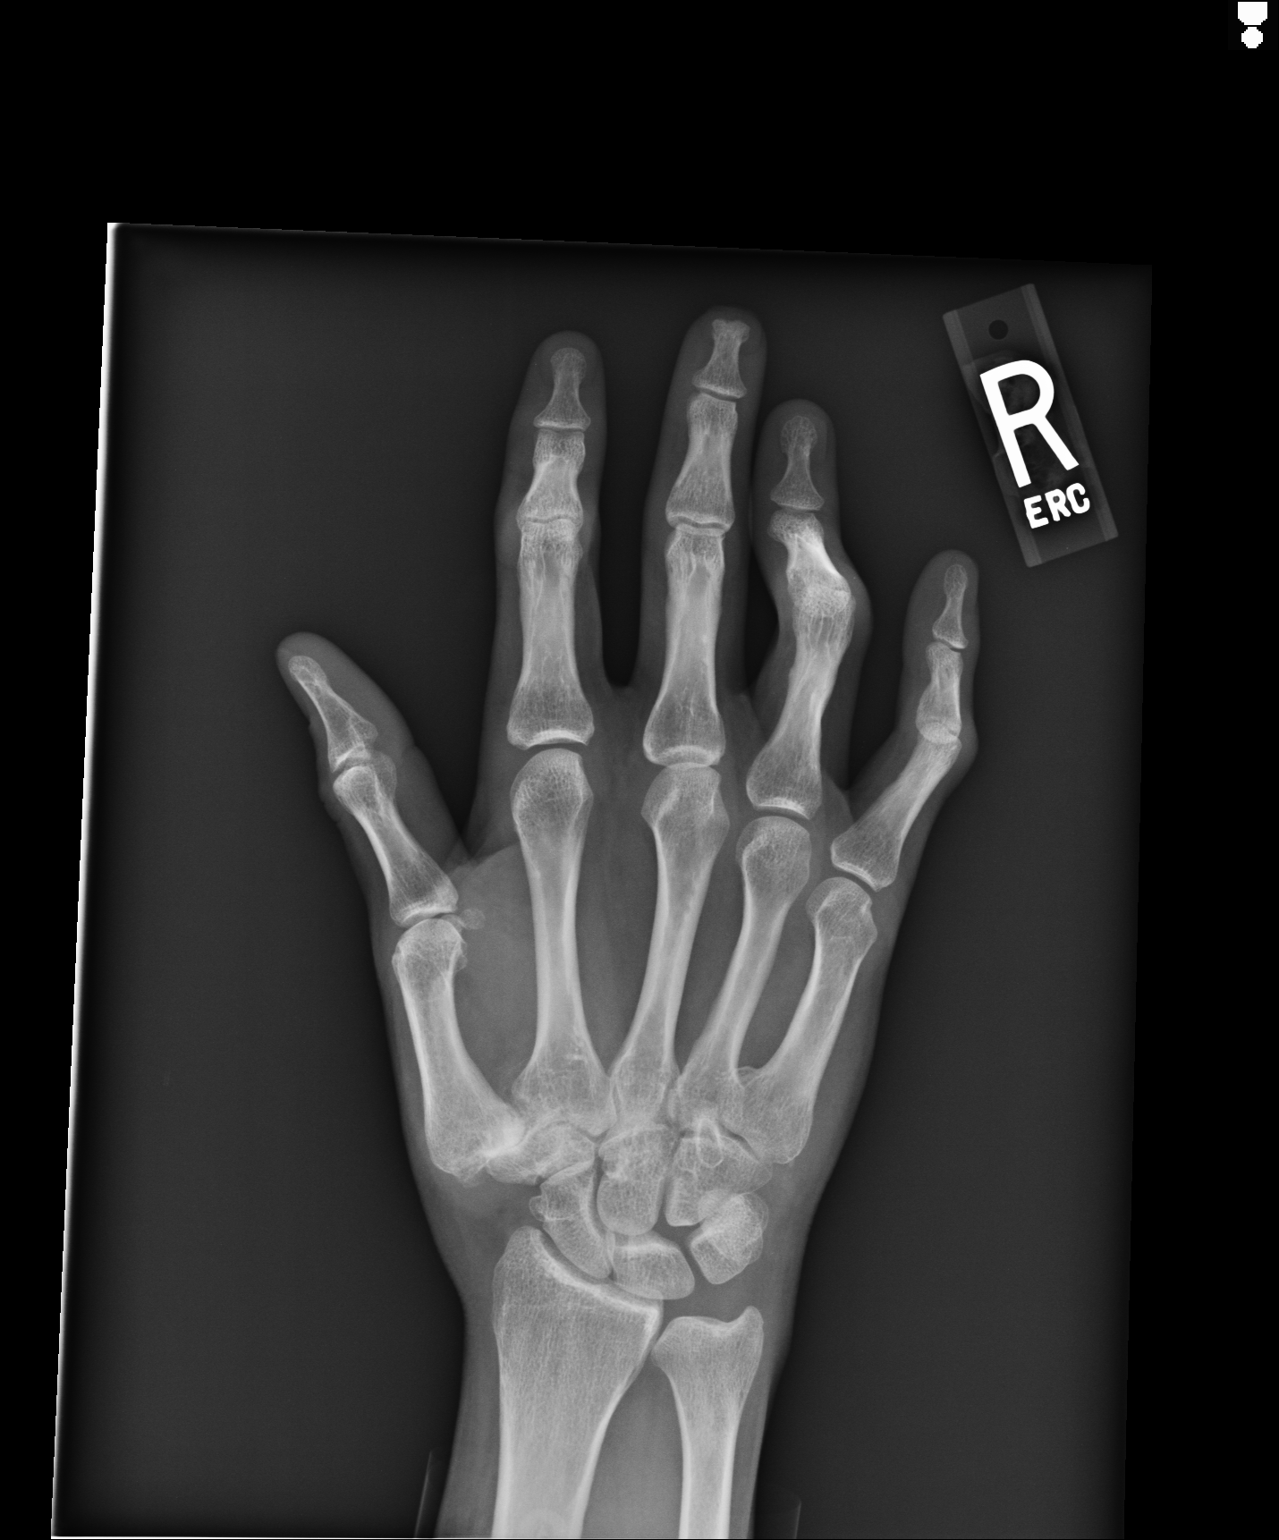
[im 2/3]
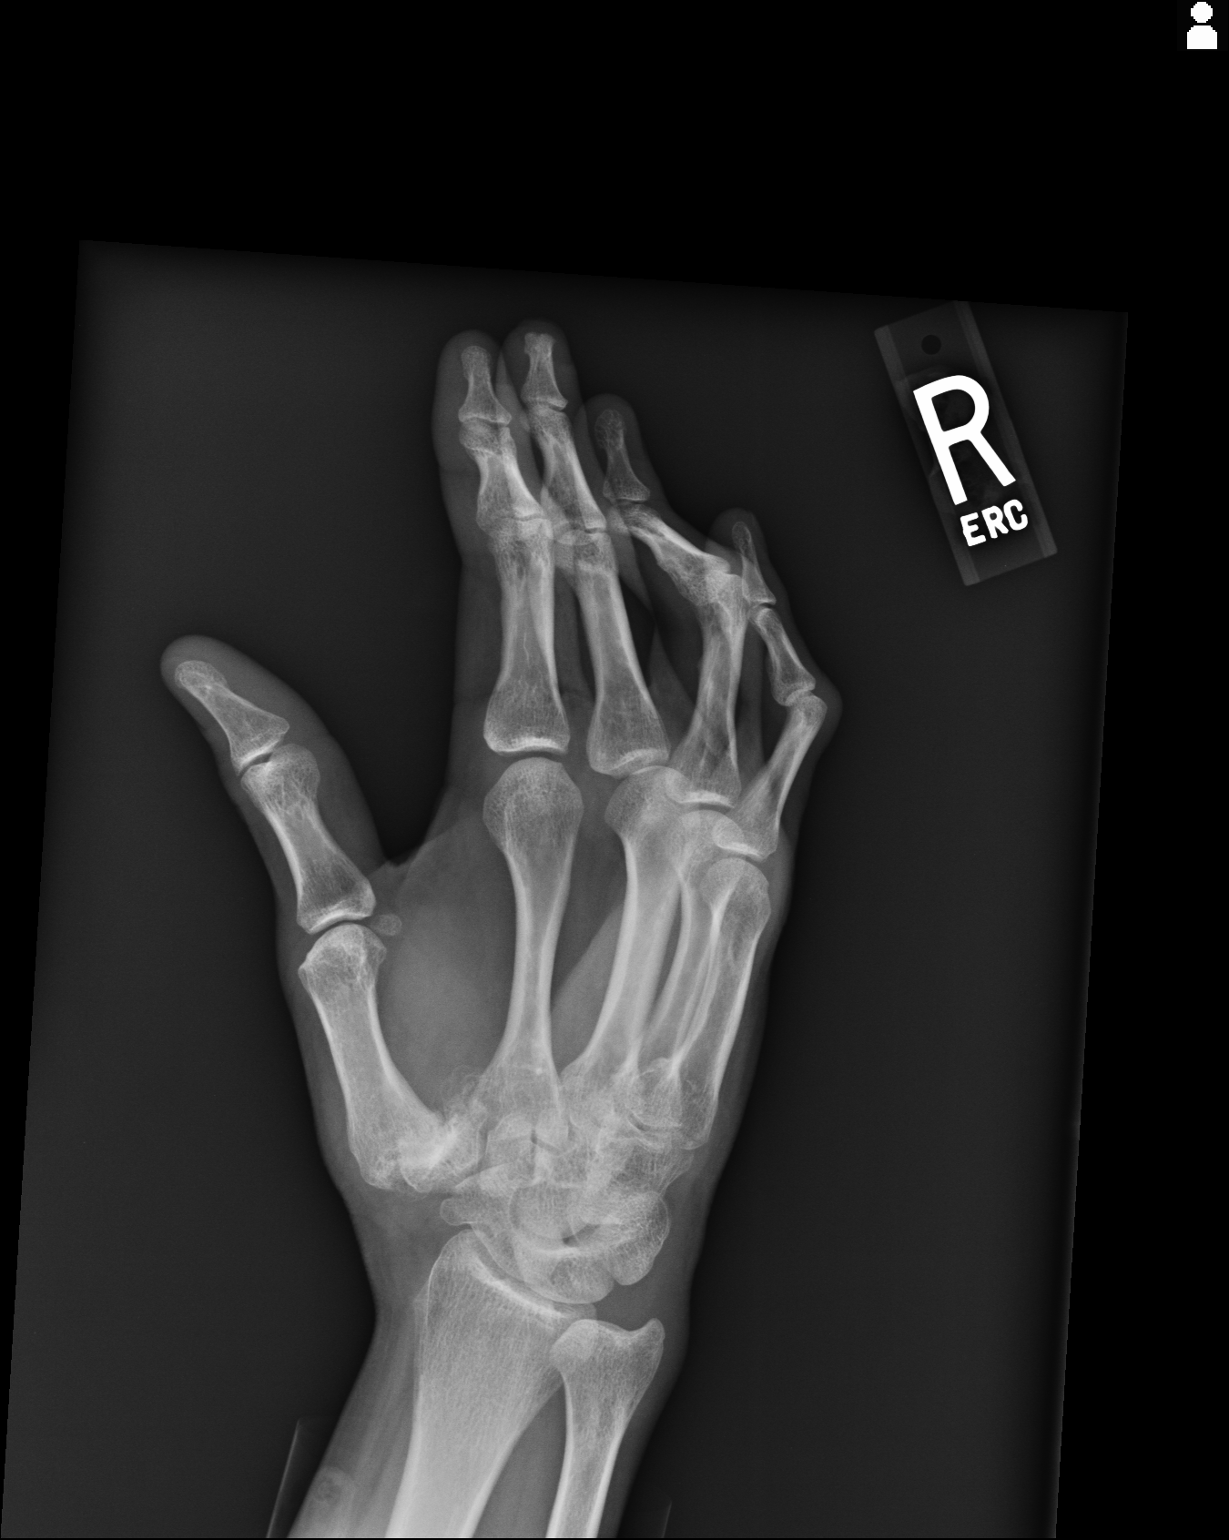
[im 3/3]
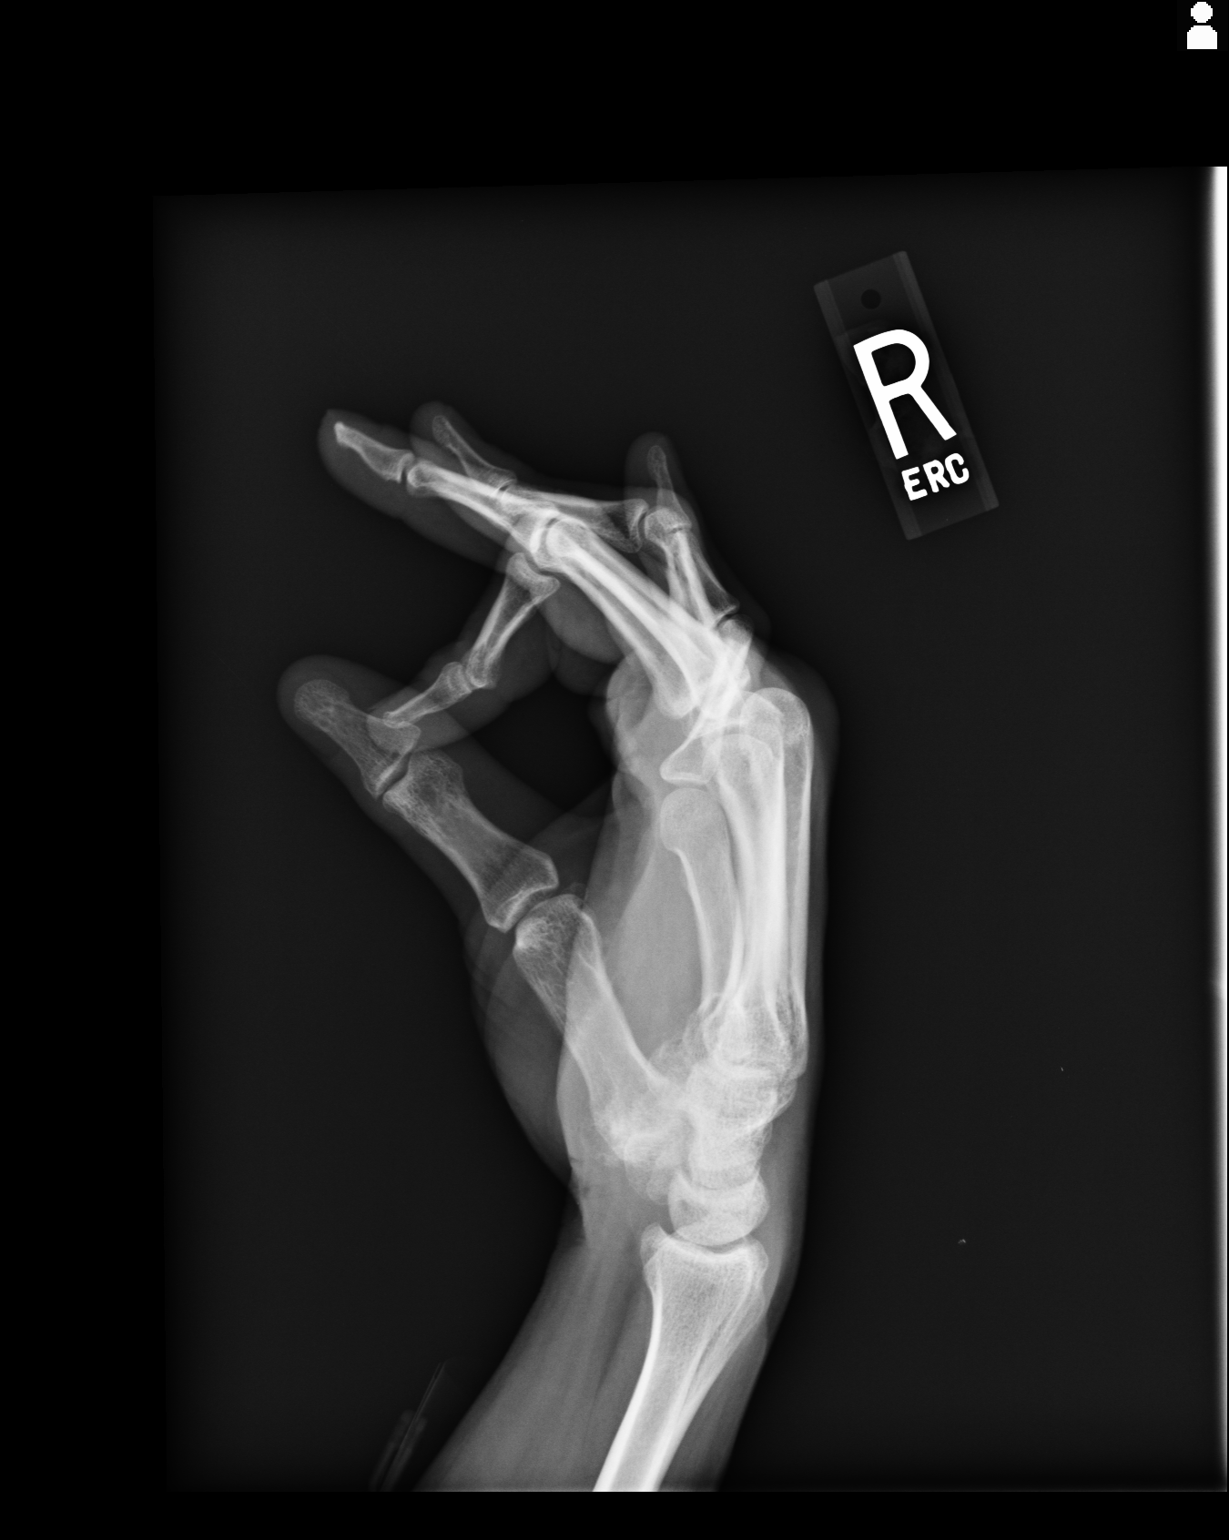

[3 of 3 positions shown; findings below may reference images not displayed]

PROCEDURE:     DXR - DXR HAND RT COMPLETE W/OBLIQUES  - June 09, 2012  [DATE]

RESULT:     Images of the right hand are compared to the study dated 02 June, 2012 and 23 June, 2010.

There is deformity of the proximal interphalangeal joints of the right
fourth and fifth digits unchanged compared to the previous exam as well as
severe degenerative change in the first carpometacarpal joint and moderate
narrowing at the radiocarpal joint. There is no acute fracture, dislocation
or evidence of radiopaque foreign body. Contractures are seen in the fourth
and fifth digits at the proximal interphalangeal joints.
IMPRESSION: 1. No acute bony abnormality demonstrated. Chronic changes as described.

[REDACTED]

## 2014-07-21 IMAGING — US ABDOMEN ULTRASOUND
1 series · 13 of 25 positions shown · non-contrast
Comparison: none

REASON FOR EXAM: Aorta, GB/Fossa Biliary, CBD, Kidneys, Liver, Pancreas,
Portal Vein, Spleen - Ab
COMMENTS:

[Series 1: abdomen ultrasound · 0.31mm/px · 13 of 66 slices shown]
[im 1/66]
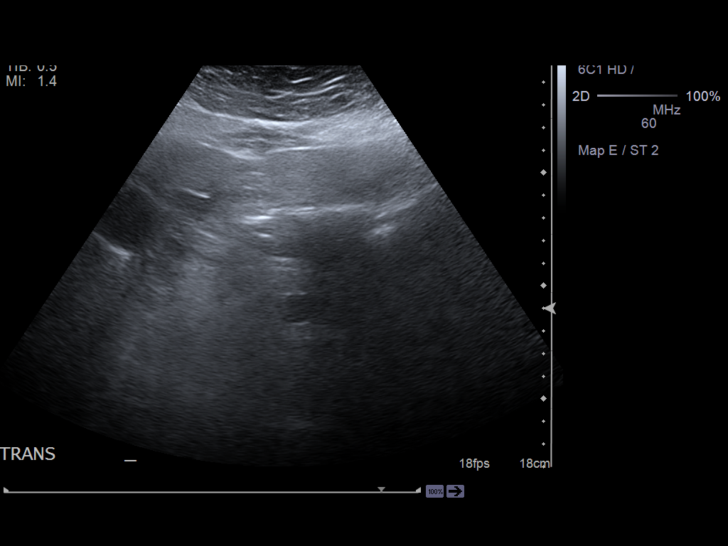
[im 6/66]
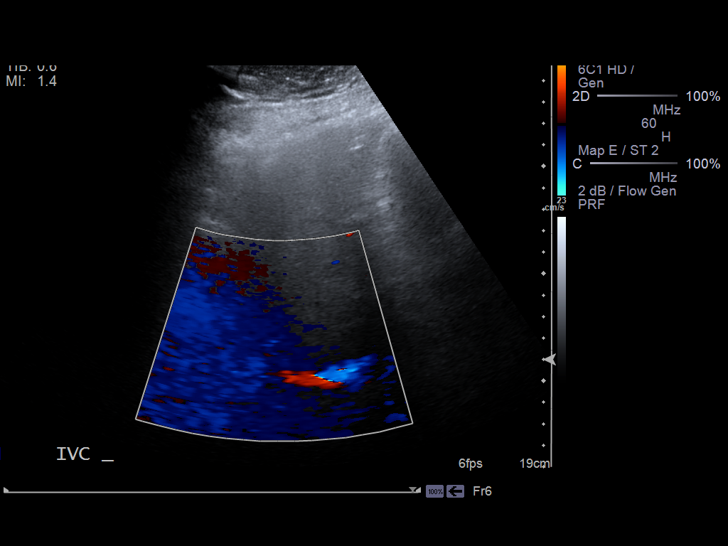
[im 11/66]
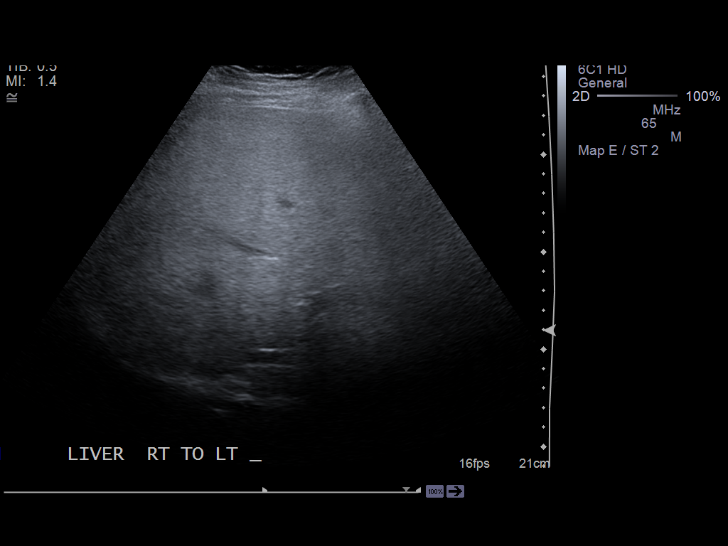
[im 17/66]
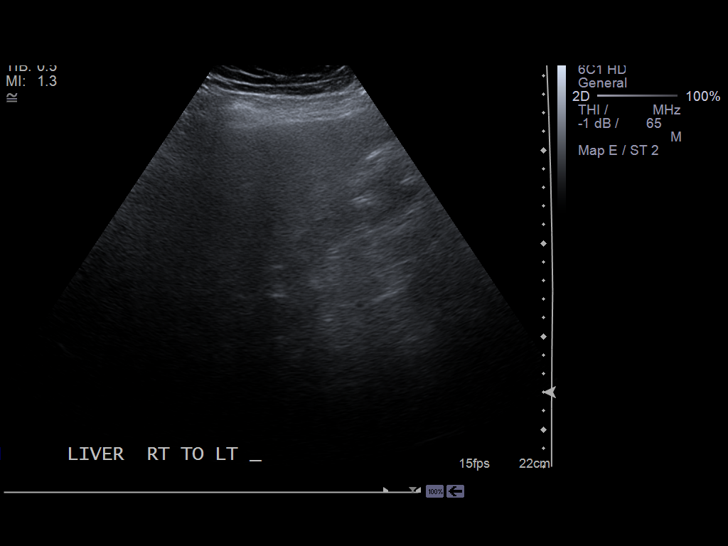
[im 22/66]
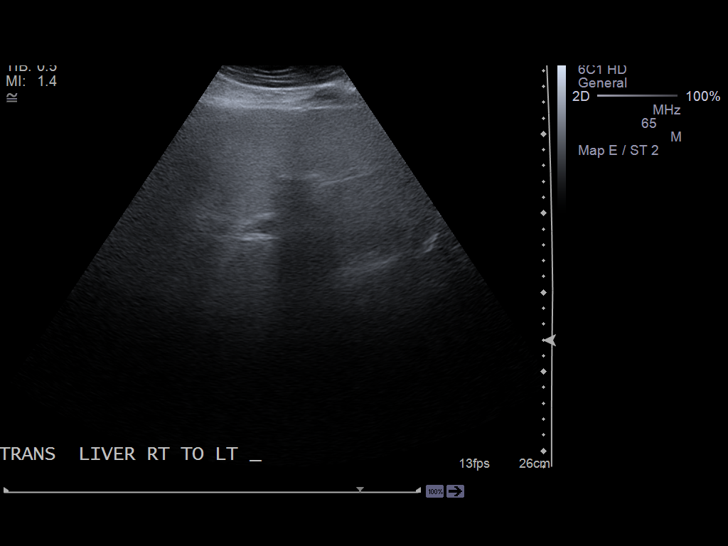
[im 28/66]
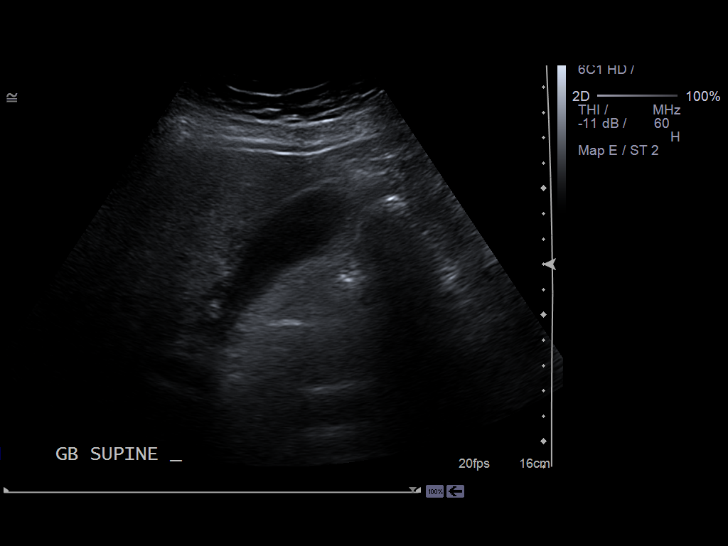
[im 33/66]
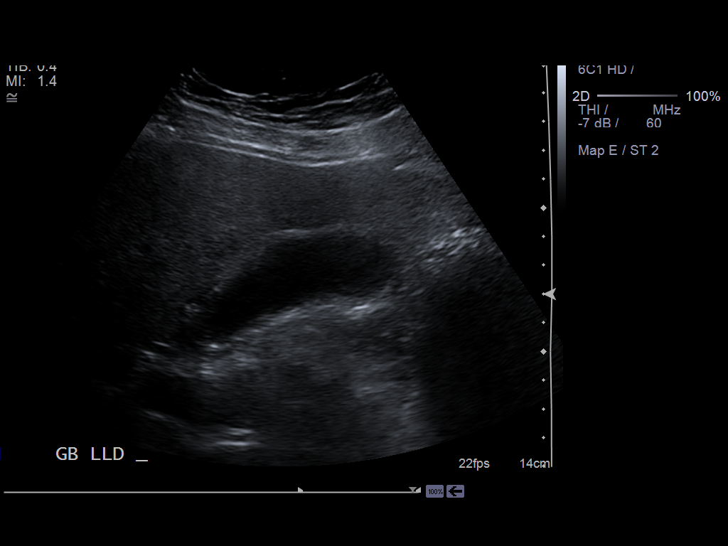
[im 38/66]
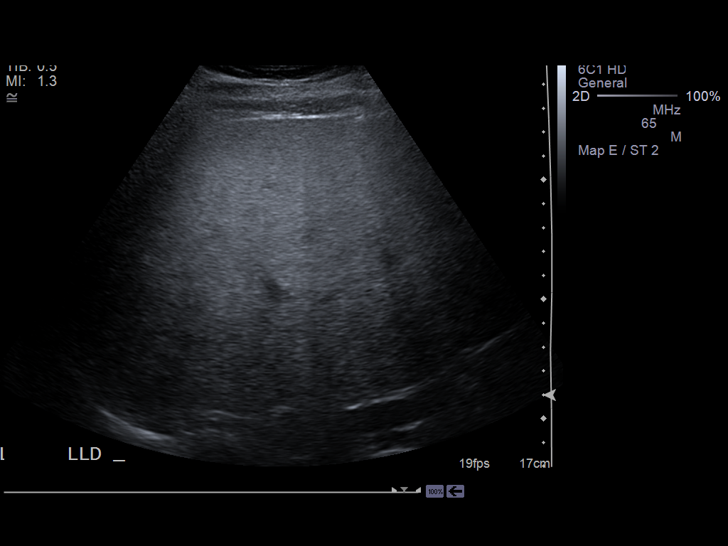
[im 44/66]
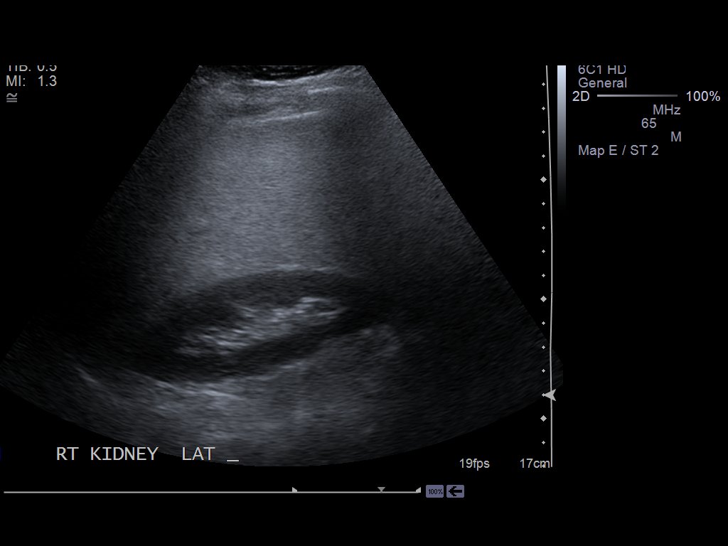
[im 49/66]
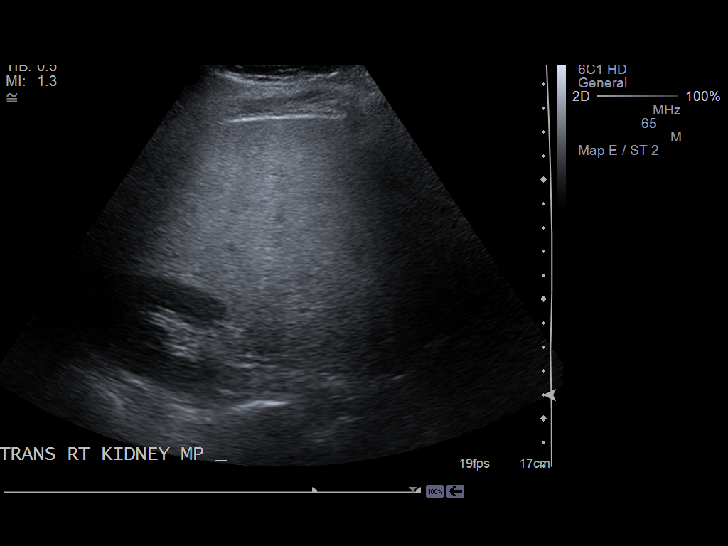
[im 55/66]
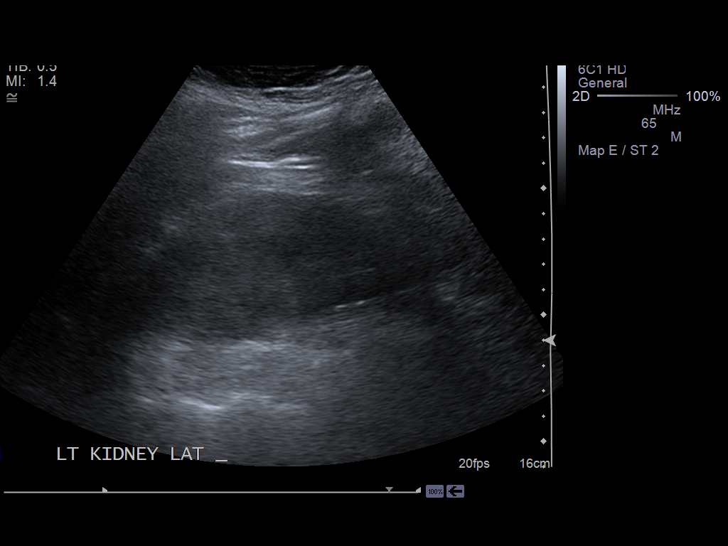
[im 60/66]
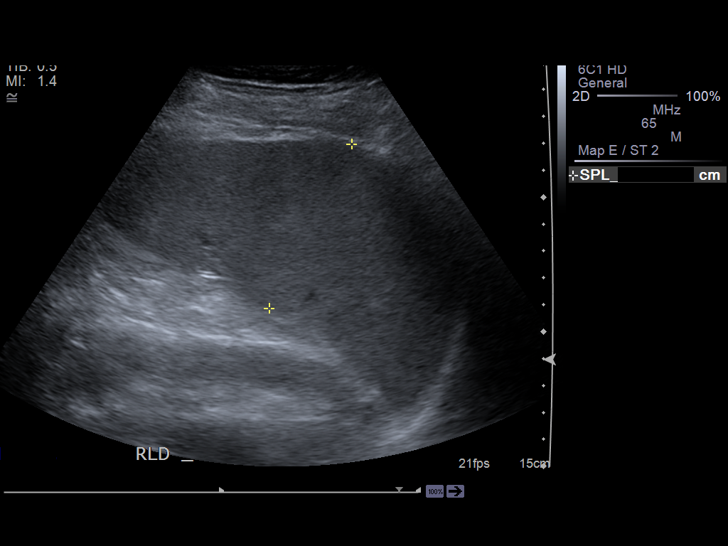
[im 66/66]
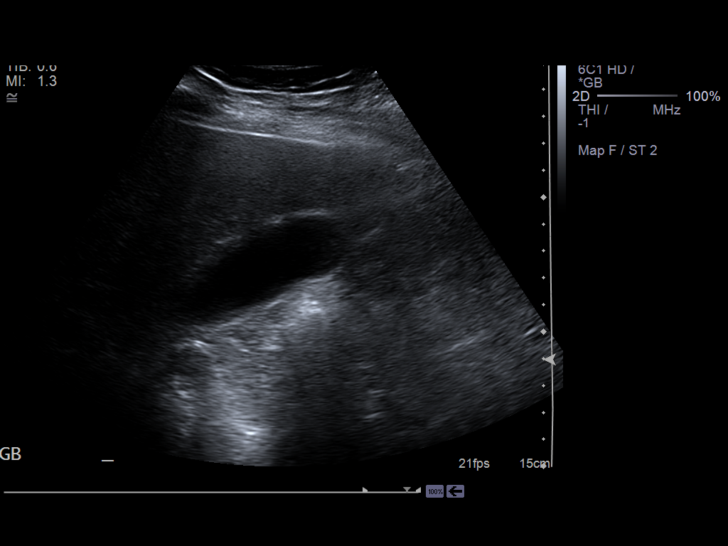

[13 of 25 positions shown; findings below may reference images not displayed]

PROCEDURE:     US  - US ABDOMEN GENERAL SURVEY  - June 15, 2012  [DATE]

RESULT:     Abdominal sonogram is limited because of overlying bowel gas and
the patient's body habitus. The liver shows dense echogenic echotexture
likely secondary to diffuse fatty infiltration. There is no focal mass
evident. No ascites is demonstrated. Limited visualization of the aorta
shows no definite aneurysm but the distal portion is not well seen. The
right kidney length is 11.17 cm. The pancreas is poorly seen. The portal
venous flow appears normal. The common bile duct diameter is 3.8 mm.
Gallbladder wall thickness is 2.0 mm. No gallstones or pericholecystic fluid
are seen. There is no sonographic Murphy's sign. Spleen is at the upper
limits of normal in size at 13.75 cm. Liver length is 17.38 cm. The right
kidney shows no solid or cystic mass. The left kidney length is 11.37 cm
without a solid or cystic mass. Neither kidney shows obstruction or evidence
of stones.
IMPRESSION: 1.     Limited examination because of the patient's body habitus and
overlying bowel gas.
2.     Fatty echogenic liver.
3.     Poor visualization of portions of the pancreas, inferior vena cava
and aorta because of bowel gas. Otherwise, unremarkable exam.

[REDACTED]

## 2014-07-22 IMAGING — CR DG CHEST 2V
1 series · 2 of 2 positions shown · non-contrast
Comparison: none

REASON FOR EXAM: increased temp
COMMENTS:

[Series 6: w chest pa · 0.14mm/px · 2 of 2 slices shown]
[im 1/2]
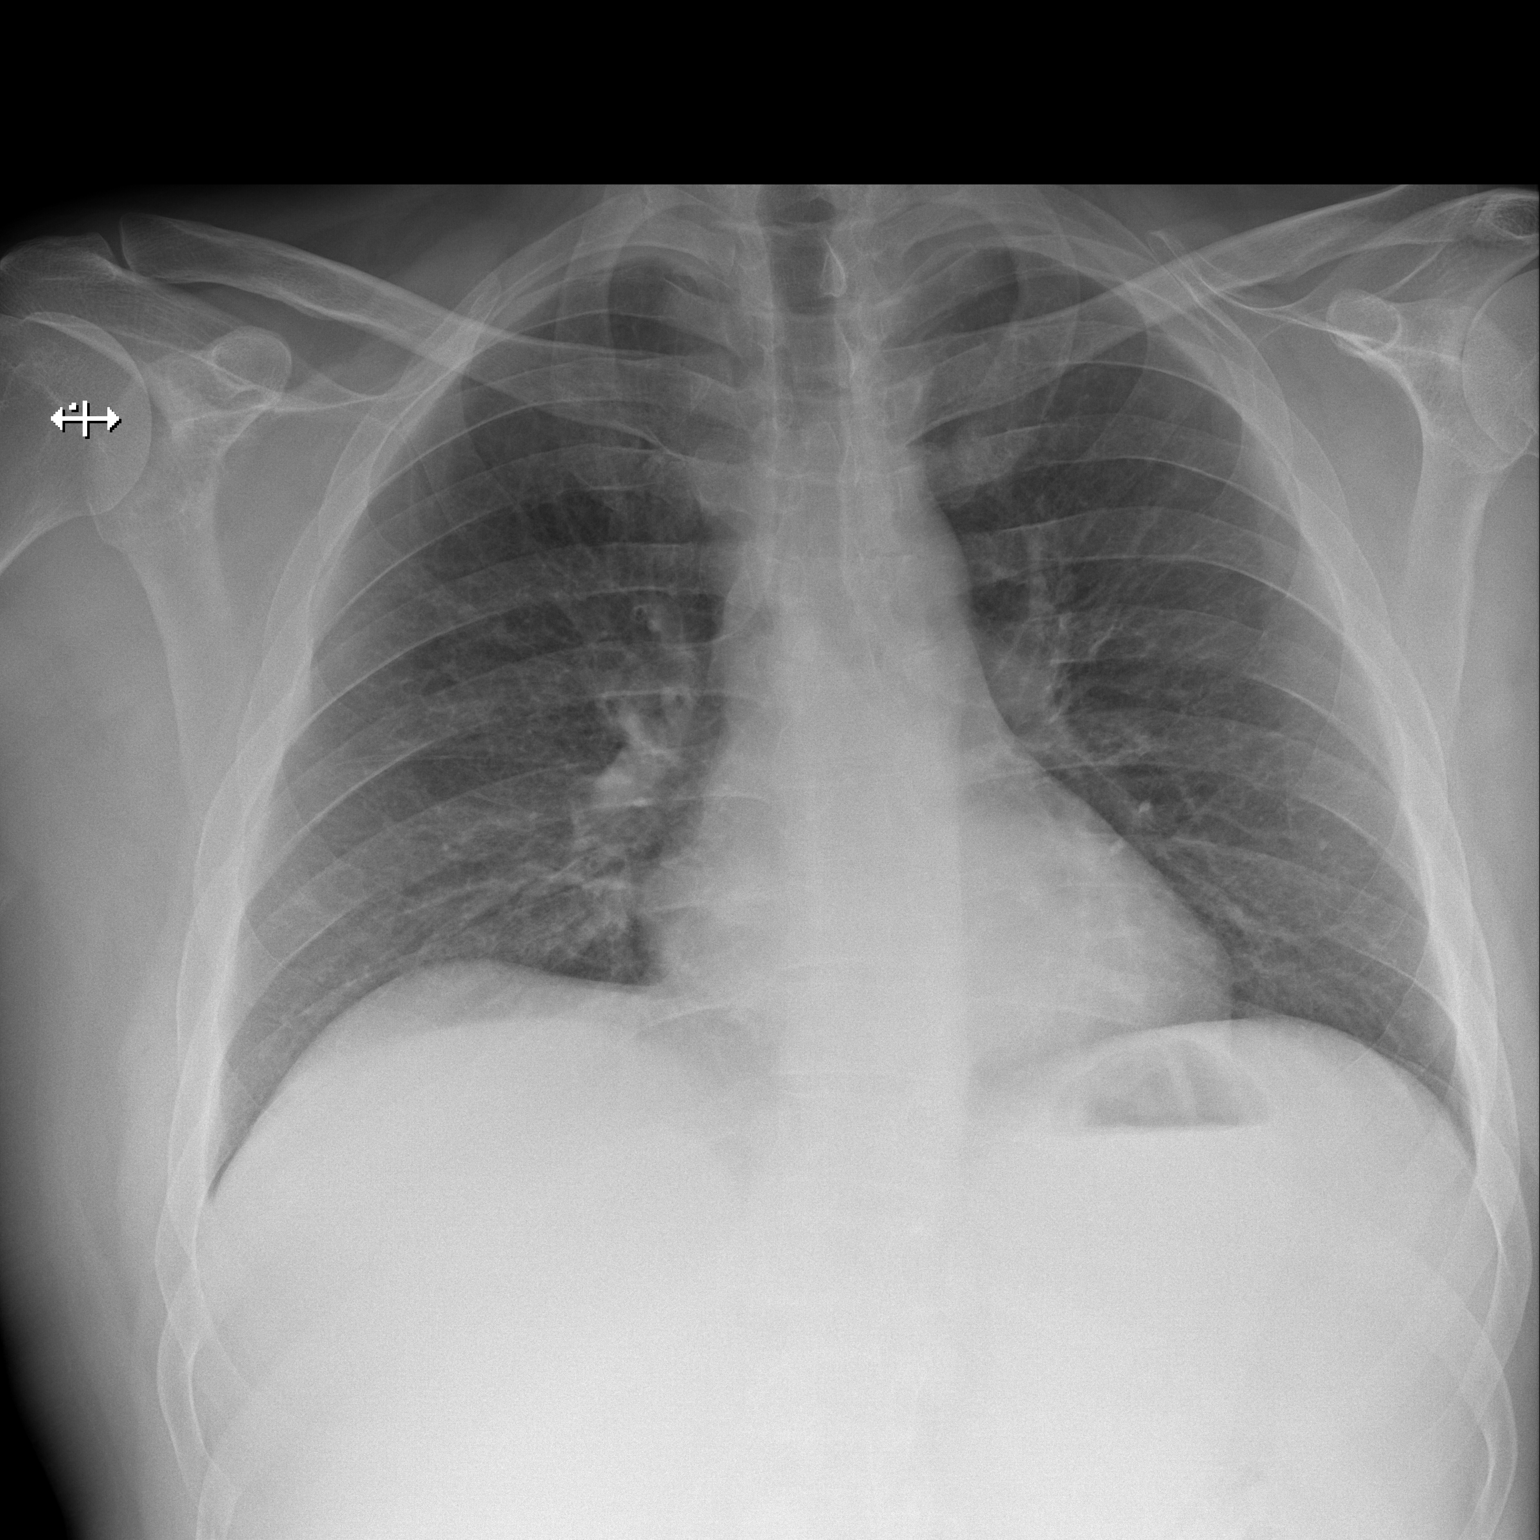
[im 2/2]
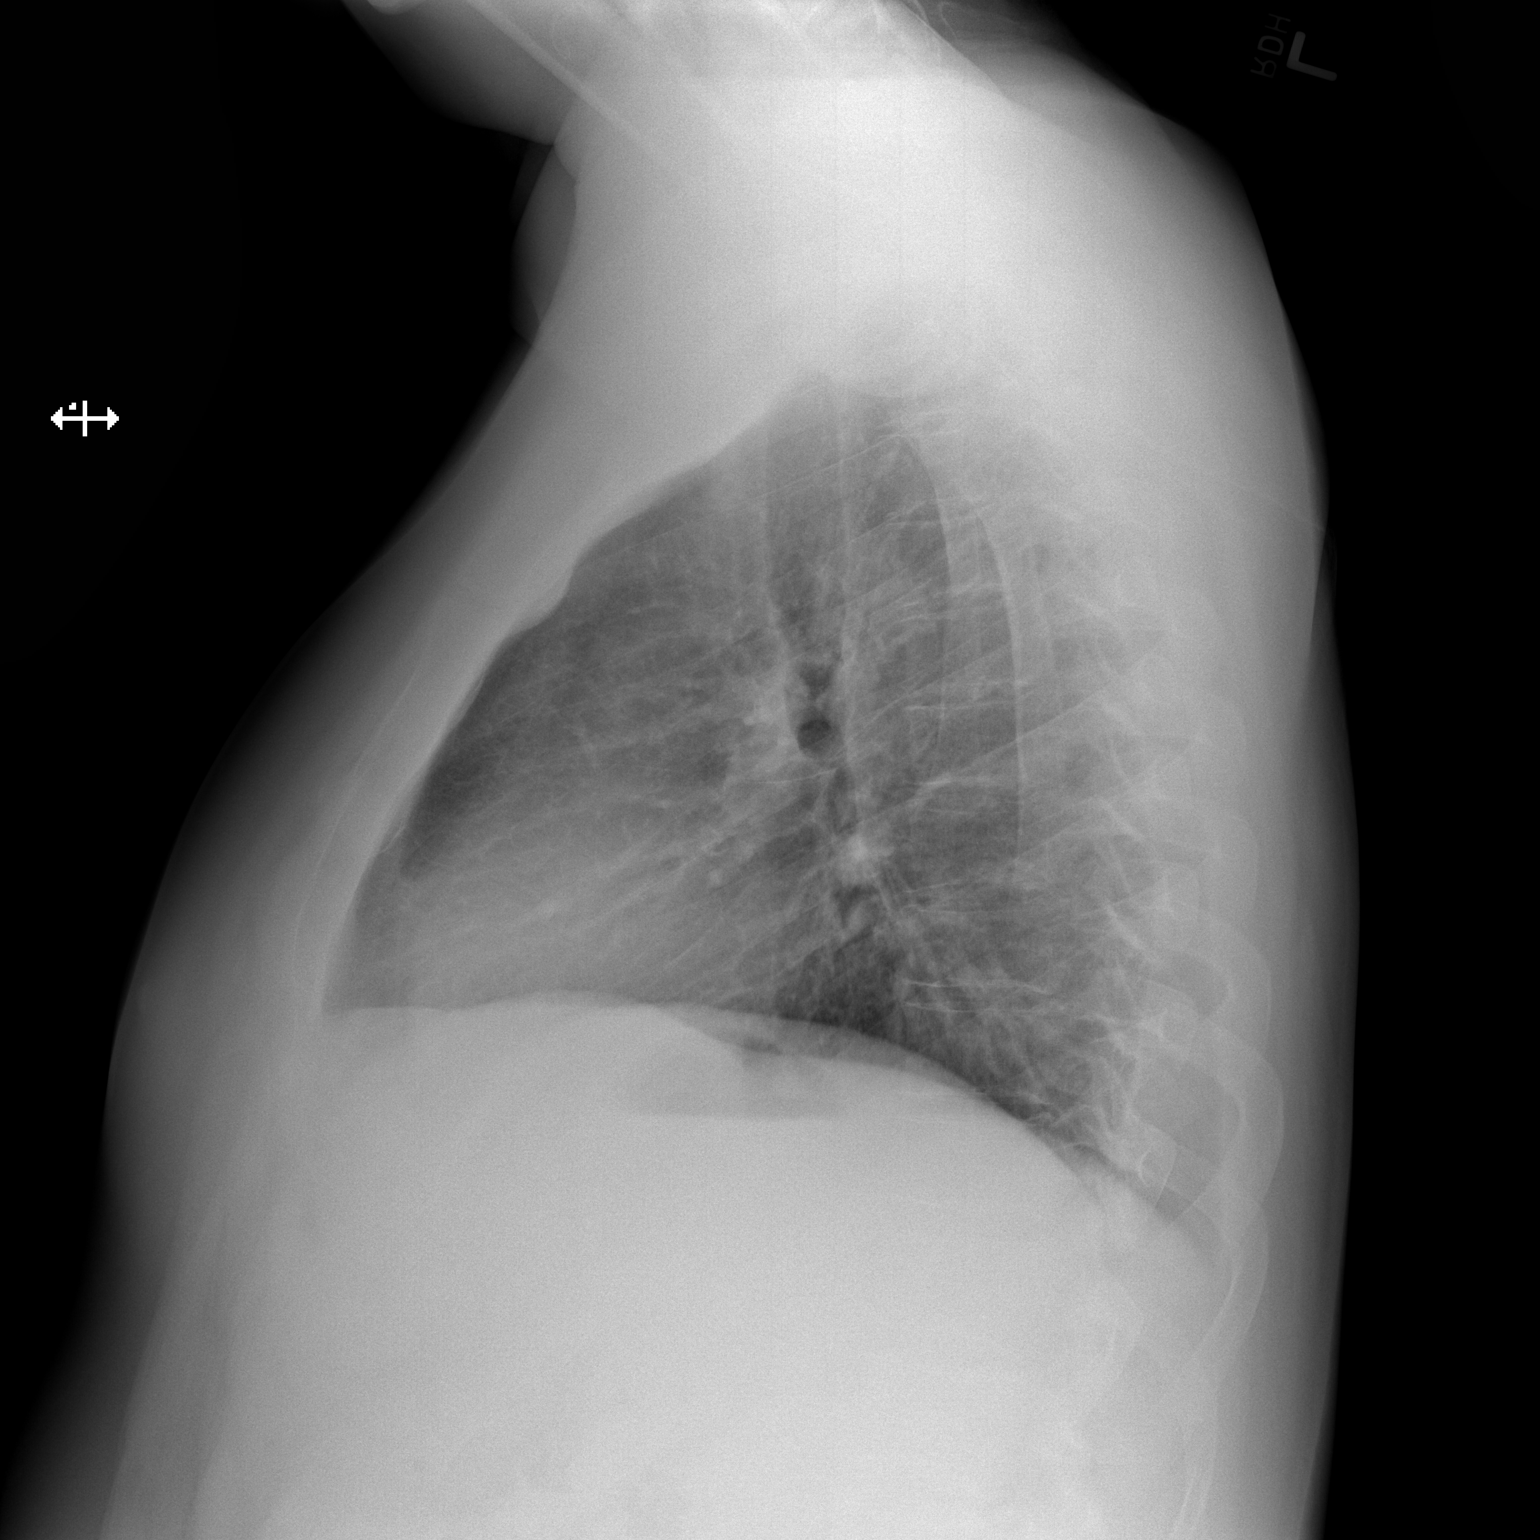

[2 of 2 positions shown; findings below may reference images not displayed]

PROCEDURE:     DXR - DXR CHEST PA (OR AP) AND LATERAL  - June 16, 2012  [DATE]

RESULT:     Comparison is made to study 09 October, 2011.

The lungs are adequately inflated. There is no focal infiltrate. There is no
evidence of atelectasis or pneumonia or pleural effusion. The cardiac
silhouette is normal in size. The mediastinum is normal in width.
IMPRESSION: There is no evidence of acute cardiopulmonary abnormality.

## 2014-09-11 ENCOUNTER — Emergency Department: Payer: Self-pay | Admitting: Emergency Medicine

## 2014-09-11 LAB — CBC
HCT: 44.4 % (ref 40.0–52.0)
HGB: 14.6 g/dL (ref 13.0–18.0)
MCH: 30 pg (ref 26.0–34.0)
MCHC: 33 g/dL (ref 32.0–36.0)
MCV: 91 fL (ref 80–100)
Platelet: 178 10*3/uL (ref 150–440)
RBC: 4.89 10*6/uL (ref 4.40–5.90)
RDW: 16.2 % — AB (ref 11.5–14.5)
WBC: 9.6 10*3/uL (ref 3.8–10.6)

## 2014-09-11 LAB — ETHANOL: Ethanol: <3 mg/dL (ref 0–80)

## 2014-09-11 LAB — SALICYLATE LEVEL: Salicylates, Serum: <1.7 mg/dL

## 2014-09-11 LAB — ACETAMINOPHEN LEVEL: Acetaminophen: <2 ug/mL

## 2014-09-12 LAB — URINALYSIS, COMPLETE
Bacteria: NONE SEEN
Bilirubin,UR: NEGATIVE
Blood: NEGATIVE
Glucose,UR: NEGATIVE mg/dL (ref 0–75)
Ketone: NEGATIVE
Leukocyte Esterase: NEGATIVE
Nitrite: NEGATIVE
PROTEIN: NEGATIVE
Ph: 6 (ref 4.5–8.0)
SPECIFIC GRAVITY: 1.003 (ref 1.003–1.030)
WBC UR: <1 /HPF (ref 0–5)

## 2014-09-12 LAB — COMPREHENSIVE METABOLIC PANEL
ALBUMIN: 3.9 g/dL (ref 3.4–5.0)
ALK PHOS: 76 U/L
AST: 14 U/L — AB (ref 15–37)
Anion Gap: 8 (ref 7–16)
BUN: 2 mg/dL — ABNORMAL LOW (ref 7–18)
Bilirubin,Total: 0.4 mg/dL (ref 0.2–1.0)
CALCIUM: 8.5 mg/dL (ref 8.5–10.1)
CO2: 26 mmol/L (ref 21–32)
CREATININE: 0.84 mg/dL (ref 0.60–1.30)
Chloride: 104 mmol/L (ref 98–107)
EGFR (African American): >60
GLUCOSE: 96 mg/dL (ref 65–99)
OSMOLALITY: 272 (ref 275–301)
Potassium: 3.9 mmol/L (ref 3.5–5.1)
SGPT (ALT): 22 U/L
Sodium: 138 mmol/L (ref 136–145)
TOTAL PROTEIN: 7.4 g/dL (ref 6.4–8.2)

## 2014-09-12 LAB — DRUG SCREEN, URINE
Amphetamines, Ur Screen: NEGATIVE (ref ?–1000)
BARBITURATES, UR SCREEN: NEGATIVE (ref ?–200)
Benzodiazepine, Ur Scrn: NEGATIVE (ref ?–200)
Cannabinoid 50 Ng, Ur ~~LOC~~: NEGATIVE (ref ?–50)
Cocaine Metabolite,Ur ~~LOC~~: NEGATIVE (ref ?–300)
MDMA (Ecstasy)Ur Screen: POSITIVE (ref ?–500)
Methadone, Ur Screen: NEGATIVE (ref ?–300)
Opiate, Ur Screen: NEGATIVE (ref ?–300)
PHENCYCLIDINE (PCP) UR S: NEGATIVE (ref ?–25)
TRICYCLIC, UR SCREEN: NEGATIVE (ref ?–1000)

## 2015-01-07 IMAGING — CR DG CHEST 1V PORT
1 series · 1 of 1 positions shown · non-contrast
Comparison: none

REASON FOR EXAM: overdose
COMMENTS:

PROCEDURE:     DXR - DXR PORTABLE CHEST SINGLE VIEW  - December 02, 2012 [DATE]
RESULT:     Comparison: 08/10/2012

[ap]
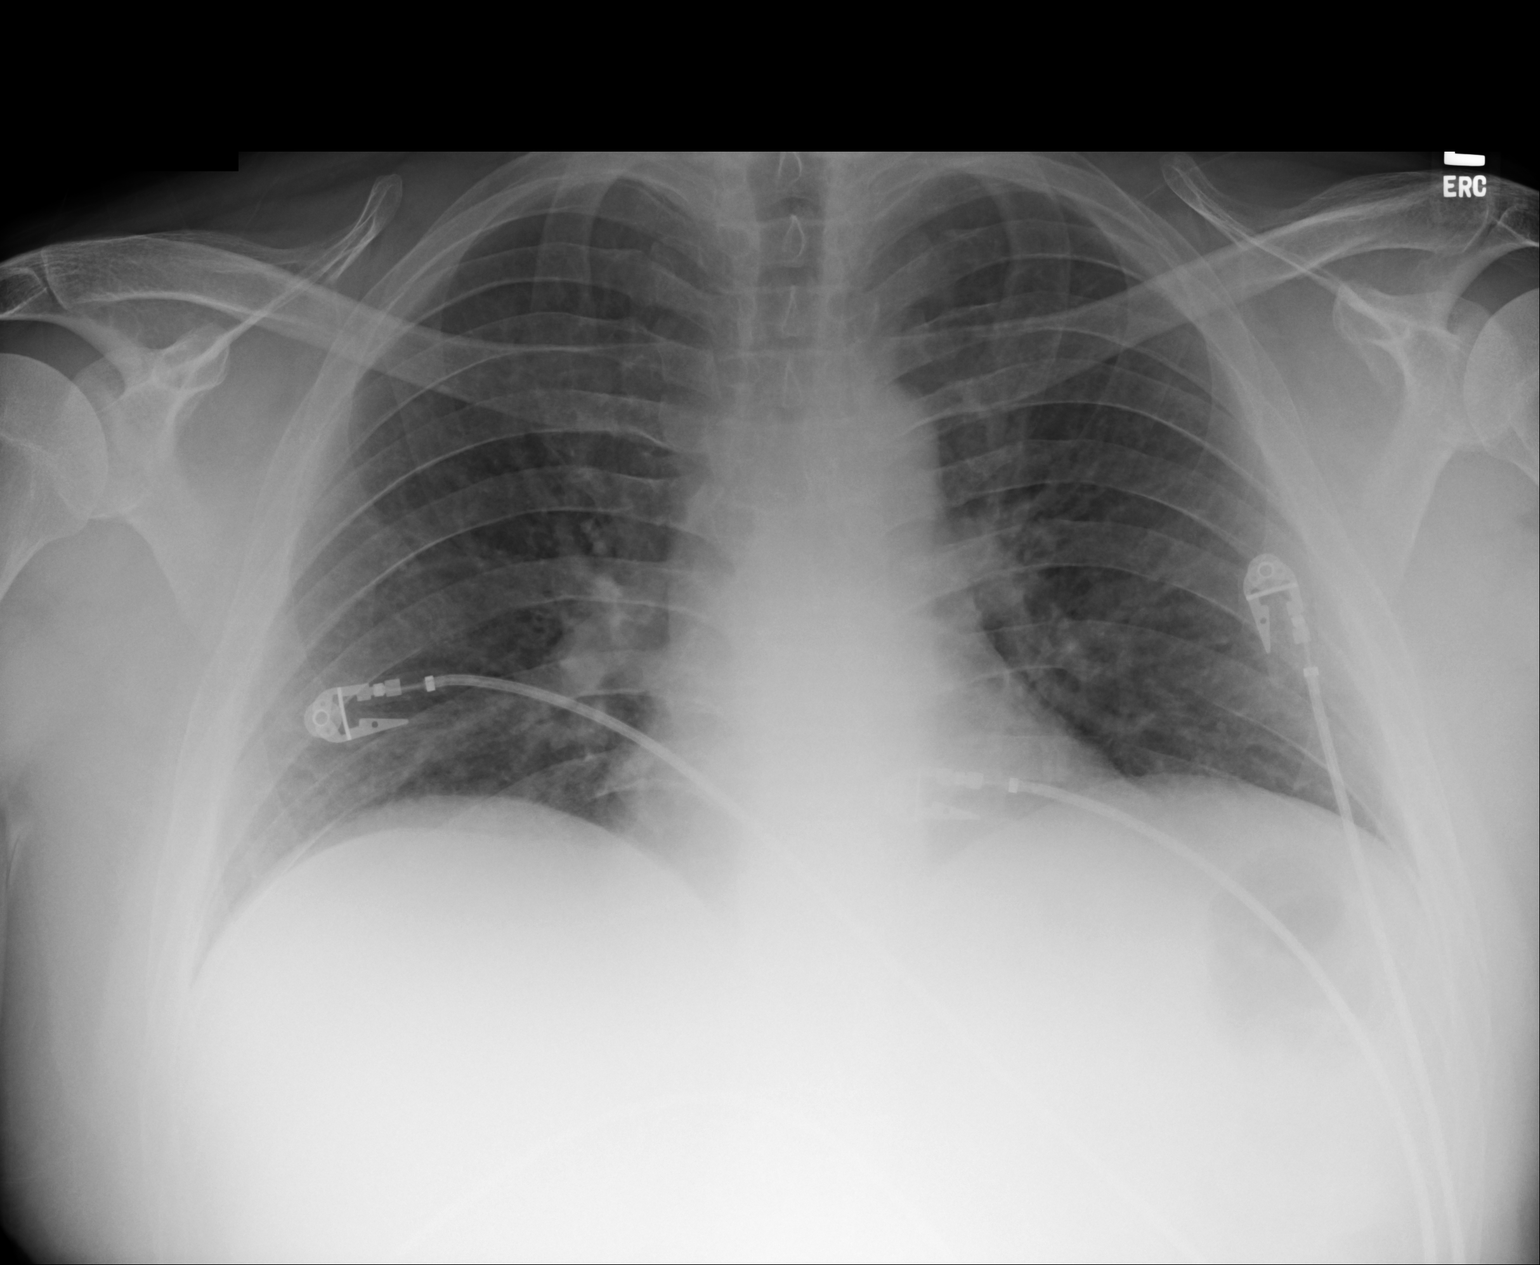

[1 of 1 positions shown; findings below may reference images not displayed]

FINDINGS: Single portable AP chest radiograph is provided.  There is no focal
parenchymal opacity, pleural effusion, or pneumothorax. Normal
cardiomediastinal silhouette. The osseous structures are unremarkable.
IMPRESSION: No acute disease of the che[REDACTED]

## 2015-01-12 IMAGING — US ABDOMEN ULTRASOUND LIMITED
1 series · 14 of 25 positions shown · non-contrast
Comparison: none

REASON FOR EXAM: RUQ discomfort on palpation
COMMENTS:   Body Site: GB and Fossa, CBD, Head of Pancreas; Right Upper Quad

PROCEDURE:     US  - US ABDOMEN LIMITED SURVEY  - December 07, 2012  [DATE]
RESULT:     Gallbladder normal. Negative Murphy's sign. Gallbladder wall
thickness normal. Common bile duct caliber 3.6 mm.

[Series 1: abdomen ultrasound limited · 0.26mm/px · 14 of 41 slices shown]
[im 1/41]
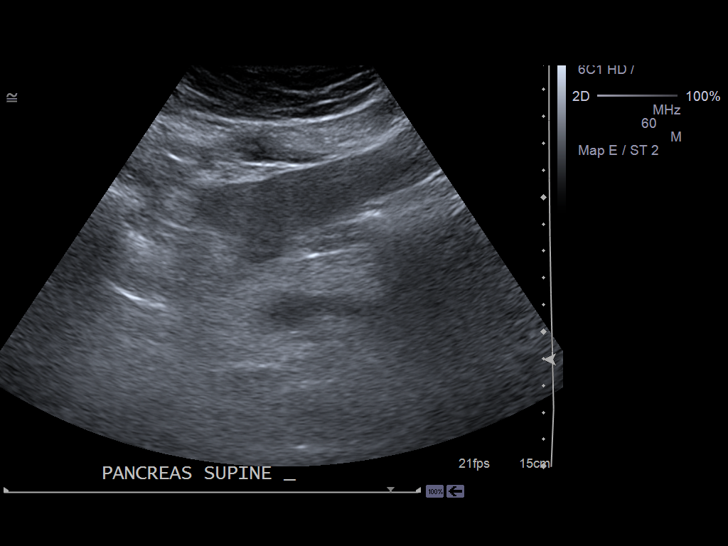
[im 4/41]
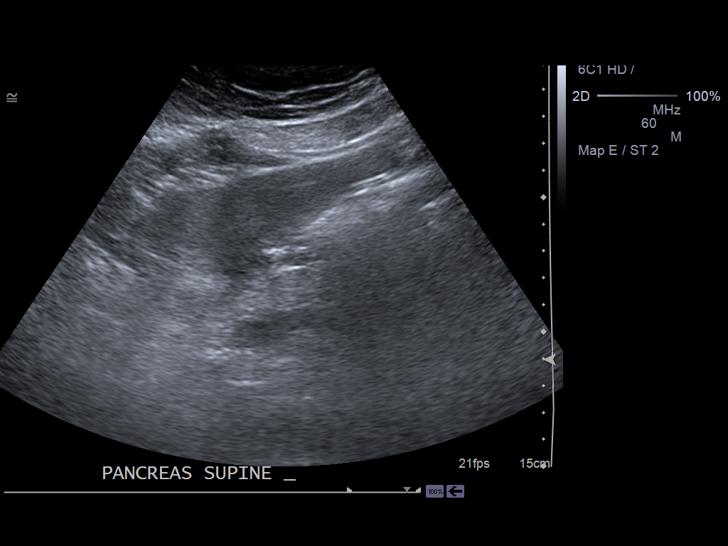
[im 7/41]
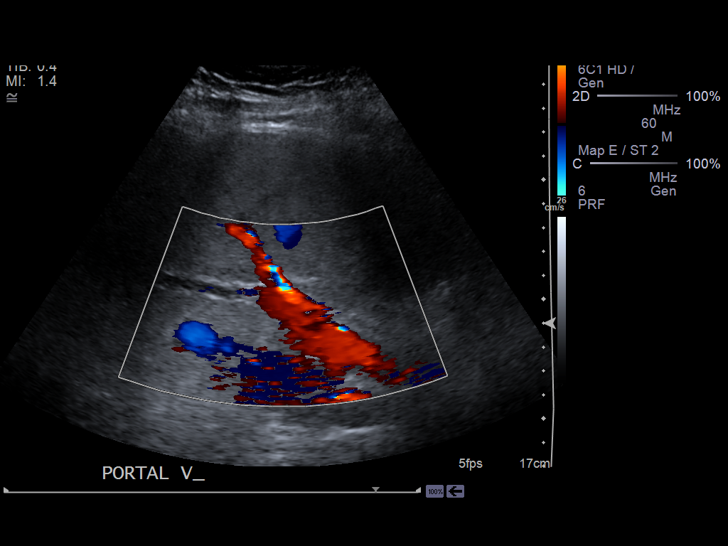
[im 11/41]
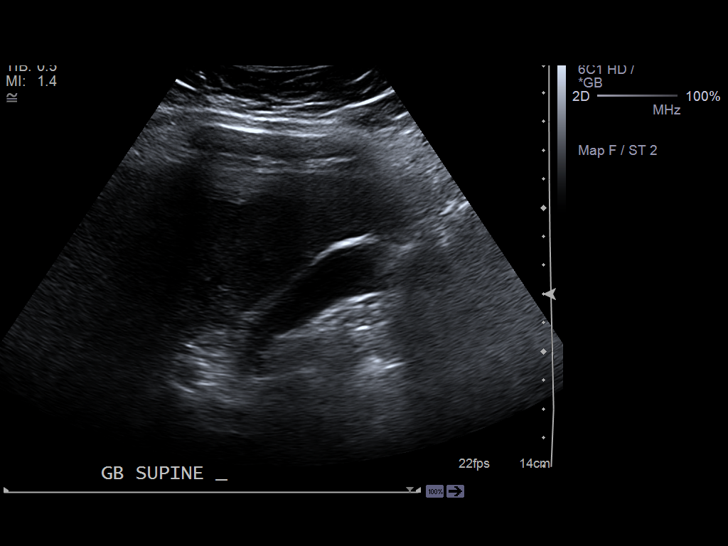
[im 14/41]
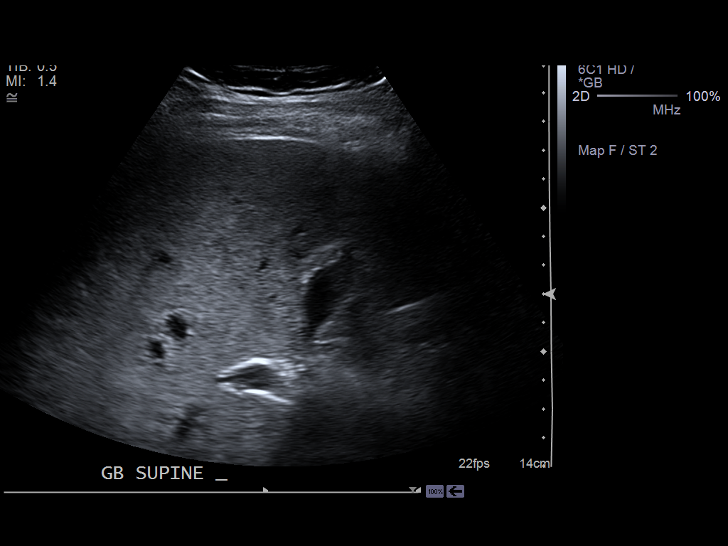
[im 16/41]
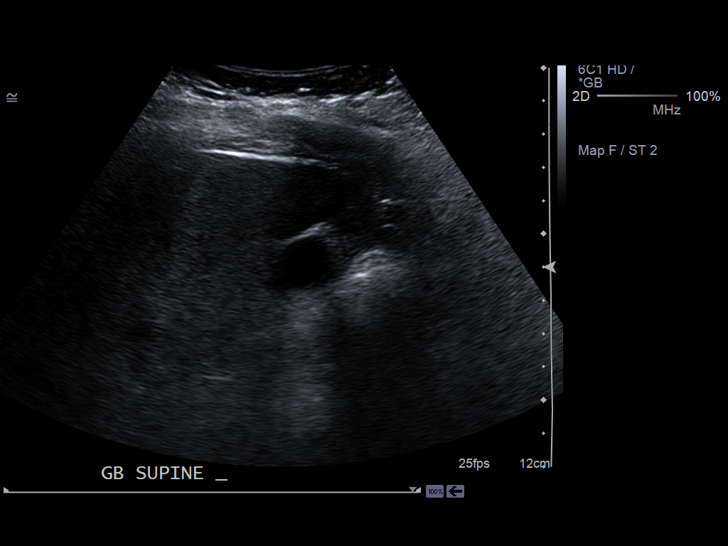
[im 19/41]
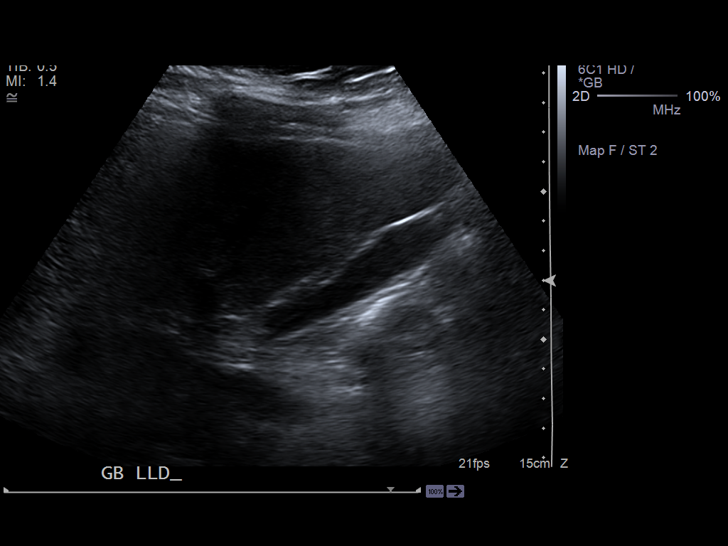
[im 22/41]
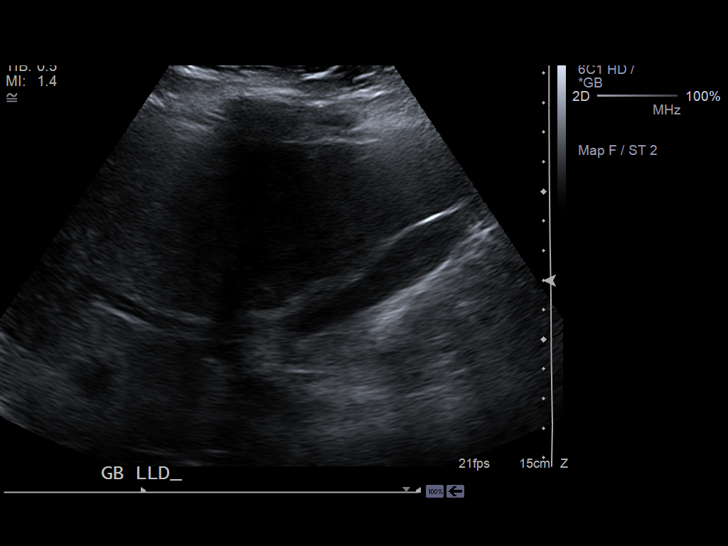
[im 26/41]
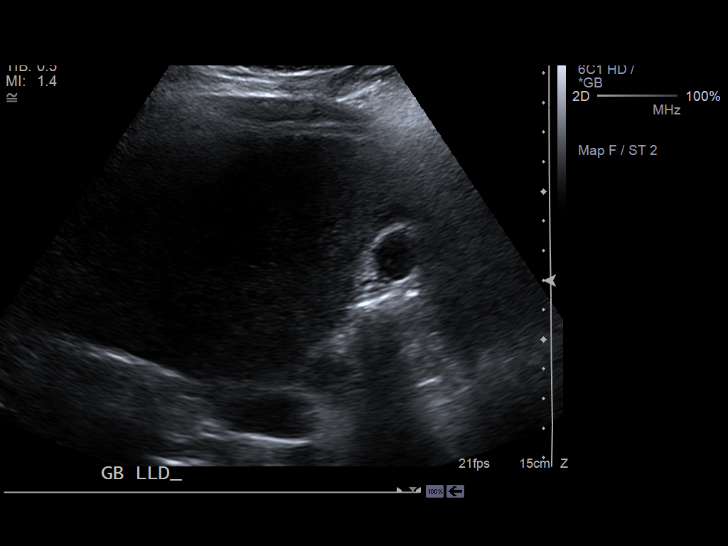
[im 27/41]
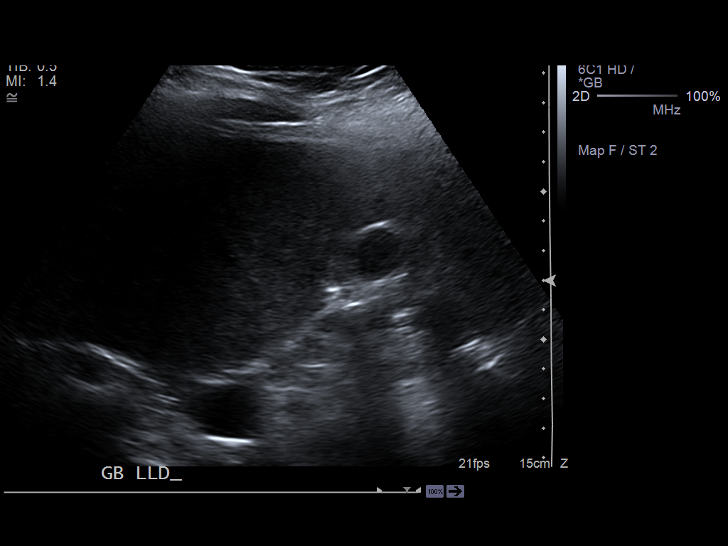
[im 31/41]
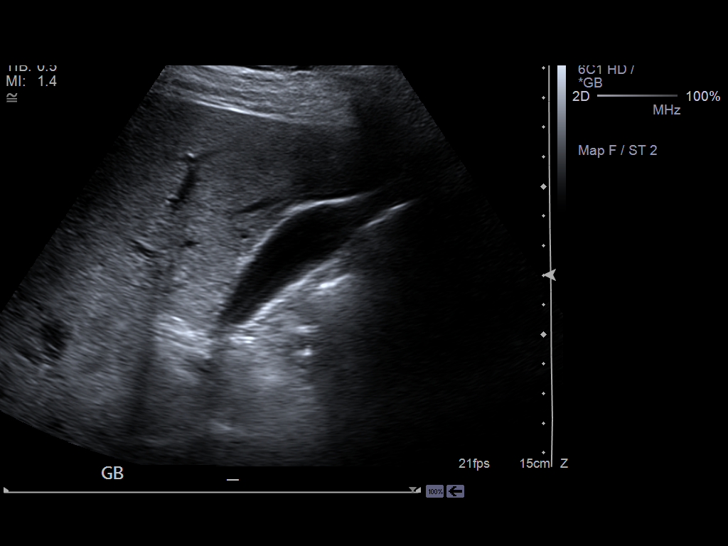
[im 34/41]
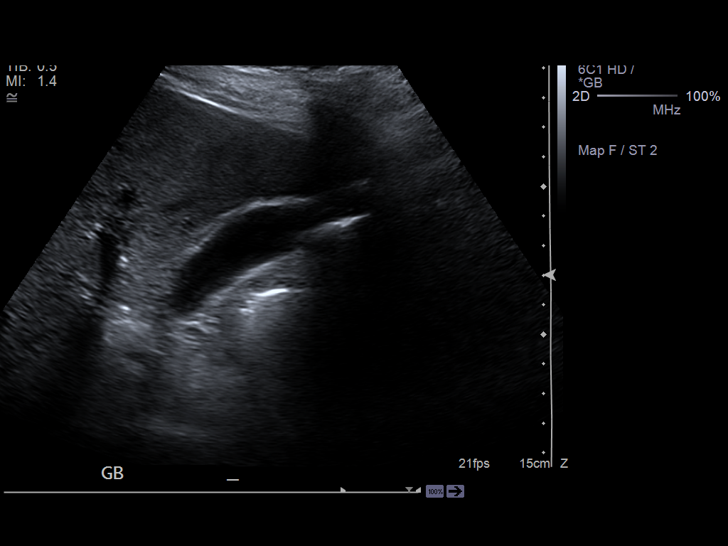
[im 37/41]
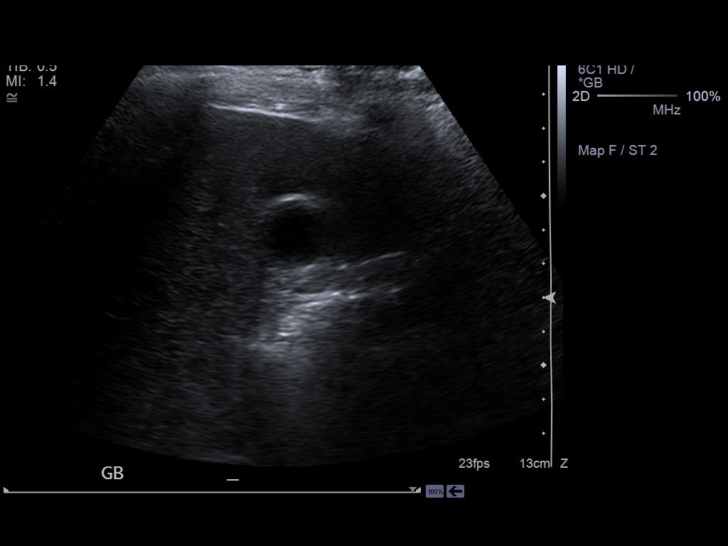
[im 41/41]
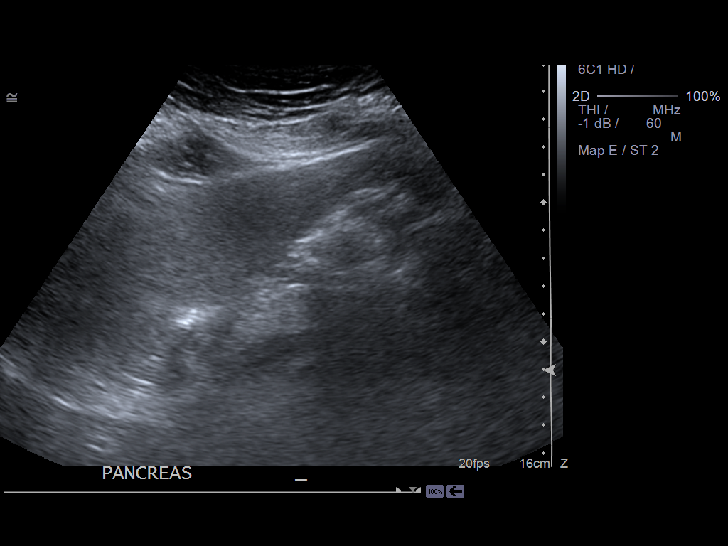

[14 of 25 positions shown; findings below may reference images not displayed]

IMPRESSION: Negative exam.

## 2015-02-25 IMAGING — CR RIGHT HAND - COMPLETE 3+ VIEW
1 series · 3 of 3 positions shown · non-contrast
Comparison: none

REASON FOR EXAM: punched wall
COMMENTS:

[Series 1: x hand pa right · 0.14mm/px · 3 of 3 slices shown]
[im 1/3]
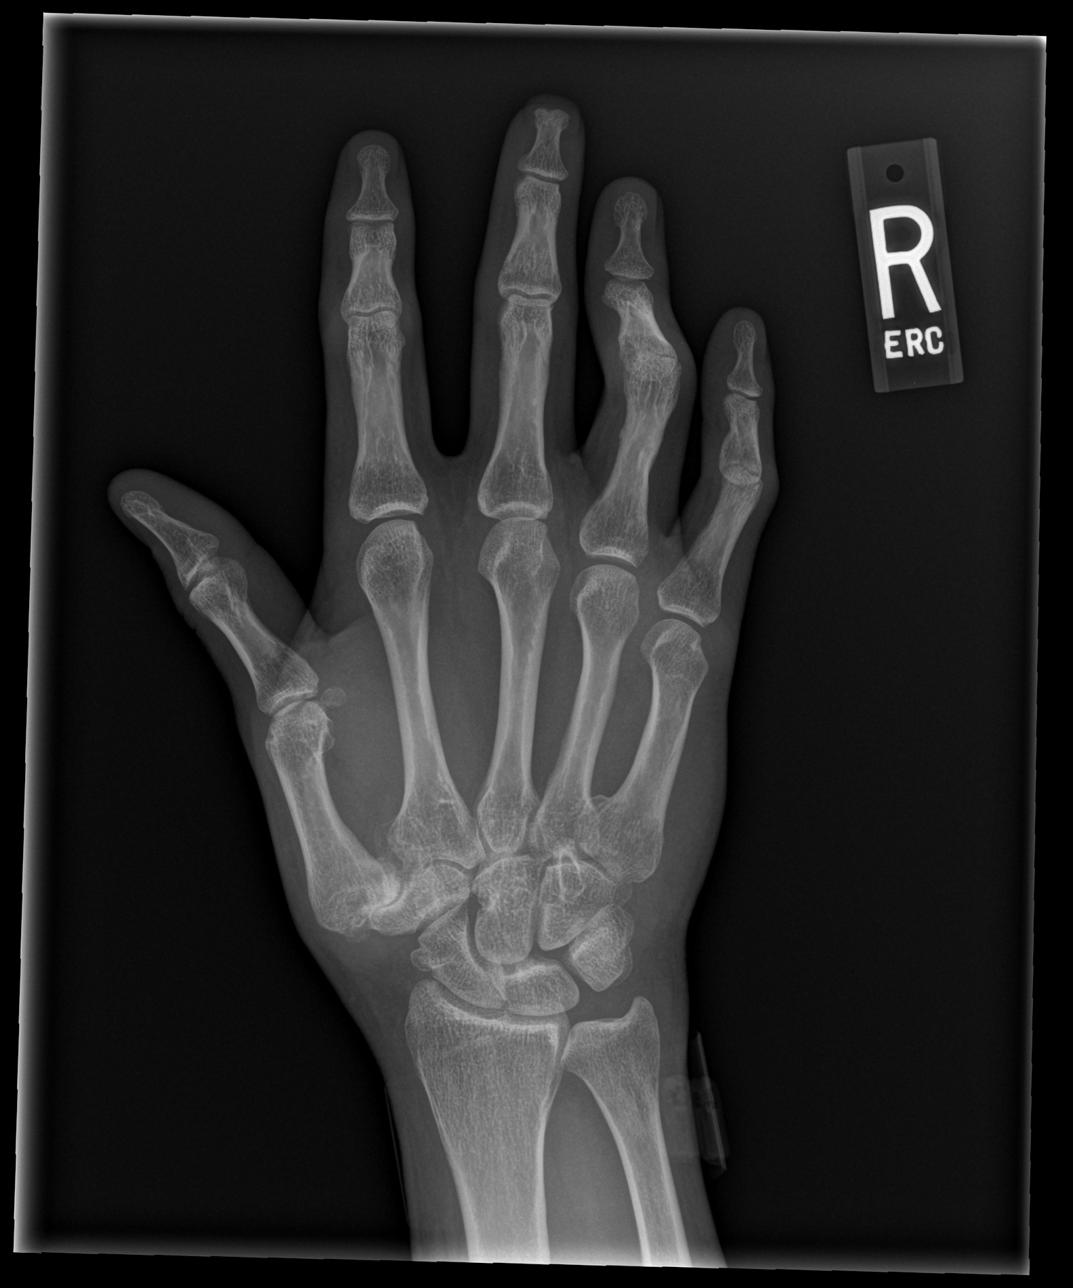
[im 2/3]
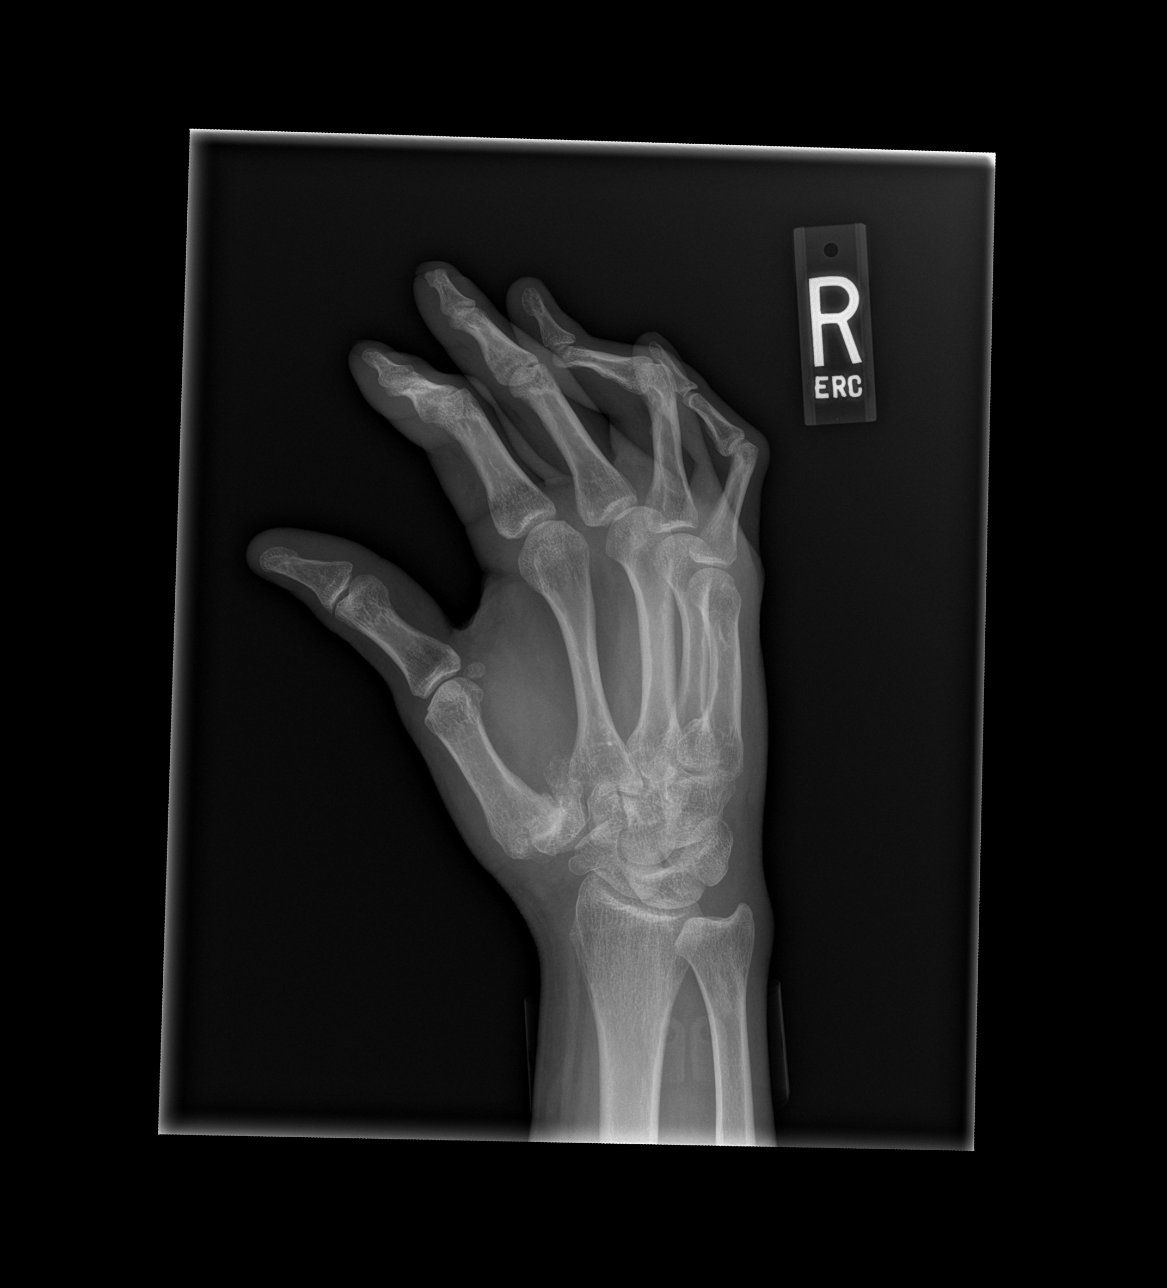
[im 3/3]
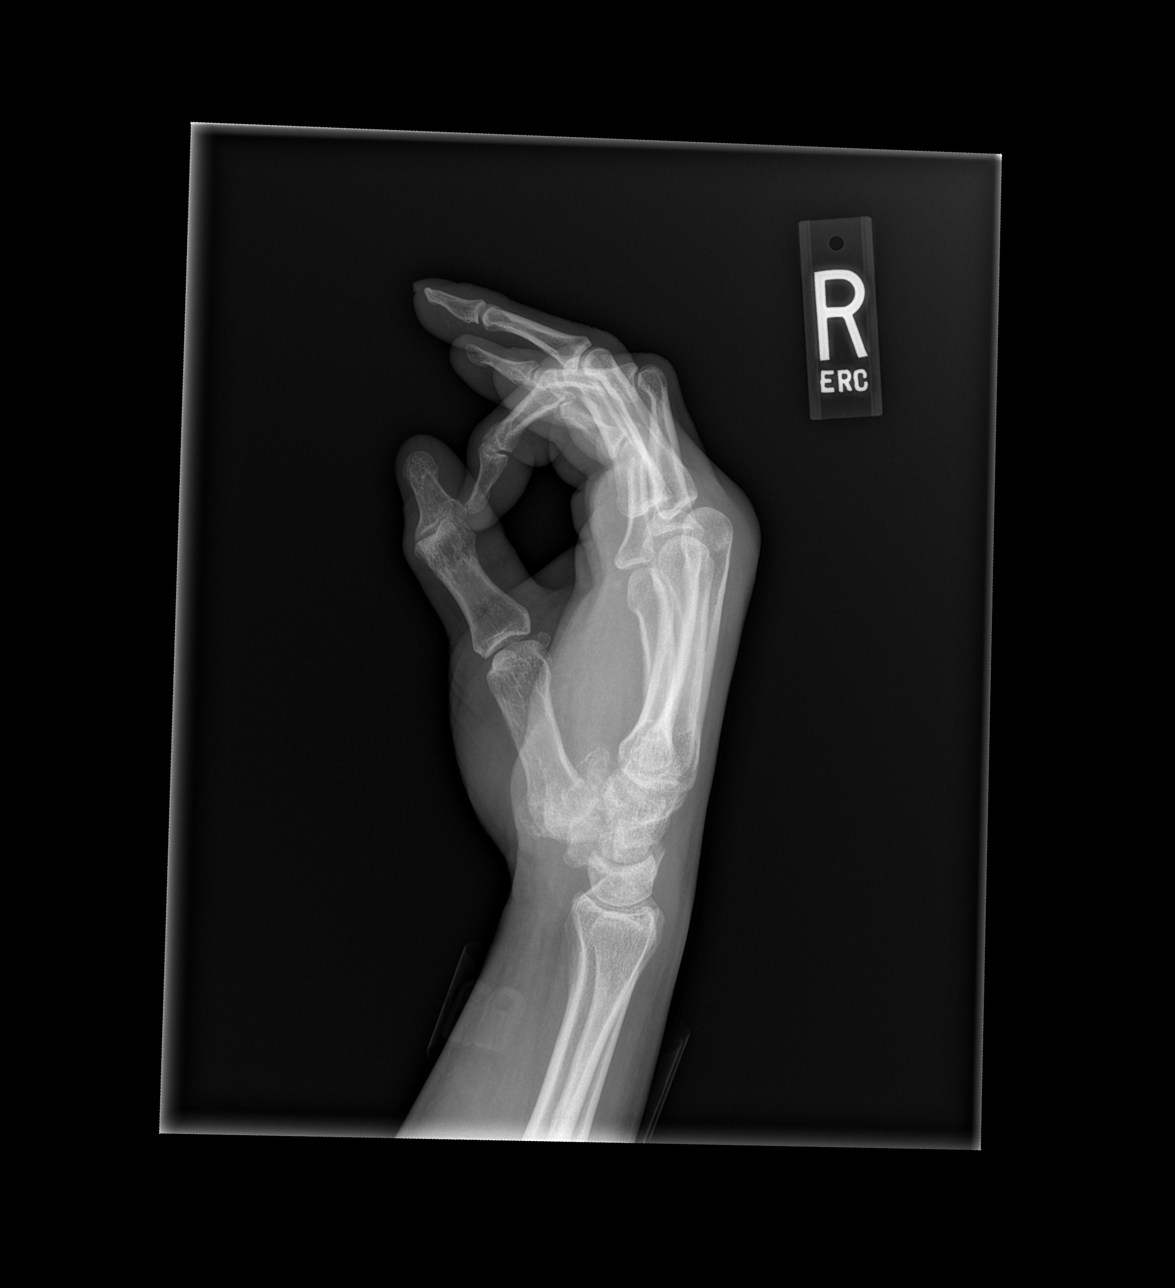

[3 of 3 positions shown; findings below may reference images not displayed]

PROCEDURE:     DXR - DXR HAND RT COMPLETE W/OBLIQUES  - January 20, 2013  [DATE]

RESULT:     Images of the right hand are compared previous studies from 02 June, 2012 and 09 June, 2012. The proximal inner phalangeal joints and the
right fourth and fifth digits appear to be held in flexion. There is
possible previous amputation of the tuft of the distal phalanx of the third
finger. There is no acute bony abnormality evident. There severe
degenerative changes in the first carpometacarpal joint.
IMPRESSION: Chronic changes as right. No acute bony abnormality evident.

[REDACTED]

## 2015-03-06 IMAGING — CR DG CHEST 1V
1 series · 1 of 1 positions shown · non-contrast
Comparison: none

REASON FOR EXAM: TB screening
COMMENTS:

[pa]
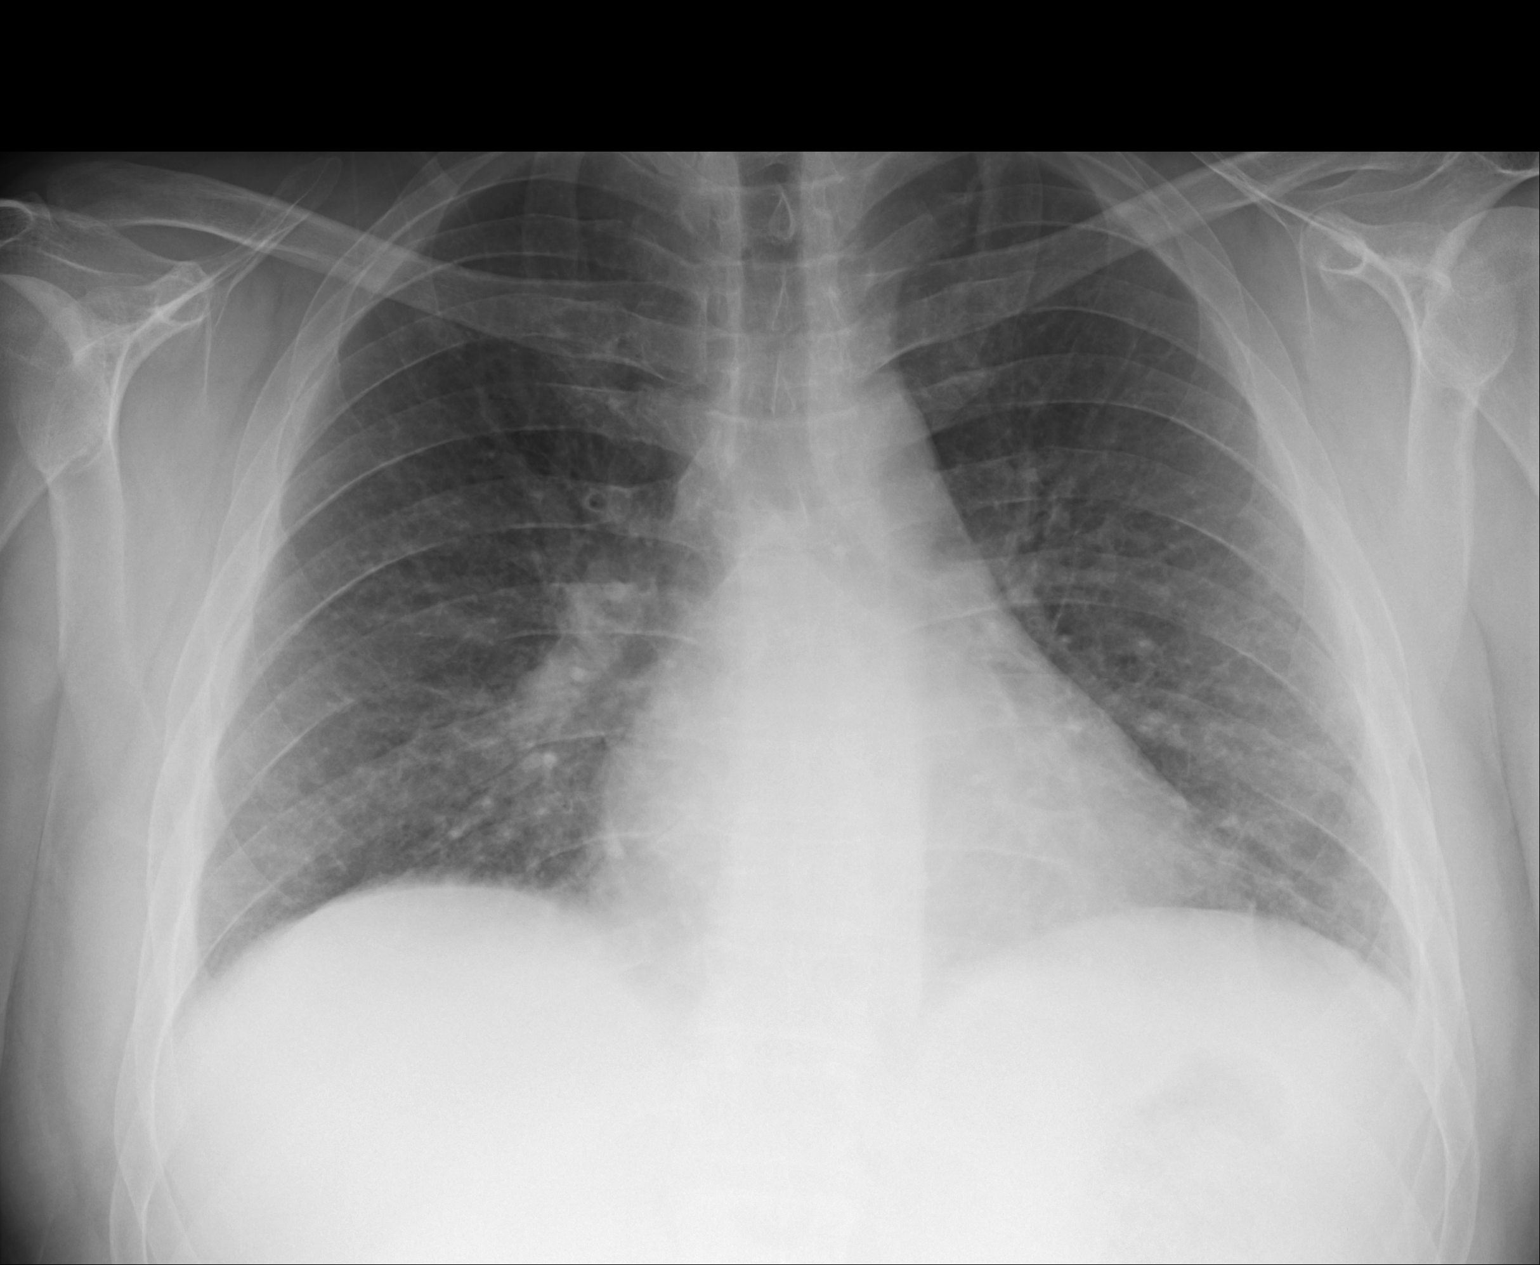

[1 of 1 positions shown; findings below may reference images not displayed]

PROCEDURE:     DXR - DXR CHEST 1 VIEWAP OR PA  - January 29, 2013  [DATE]

RESULT:     Comparison is made to the study 02 December, 2012.

The lungs are clear. The heart and pulmonary vessels are normal. The bony
and mediastinal structures are unremarkable. There is no effusion. There is
no pneumothorax or evidence of congestive failure.
IMPRESSION: No acute cardiopulmonary disease.

[REDACTED]

## 2015-03-07 IMAGING — CR RIGHT HAND - COMPLETE 3+ VIEW
1 series · 3 of 3 positions shown · non-contrast
Comparison: none

REASON FOR EXAM: pain, hit the wall
COMMENTS:   LMP: (Male)

[Series 1: x hand pa right · 0.14mm/px · 3 of 3 slices shown]
[im 1/3]
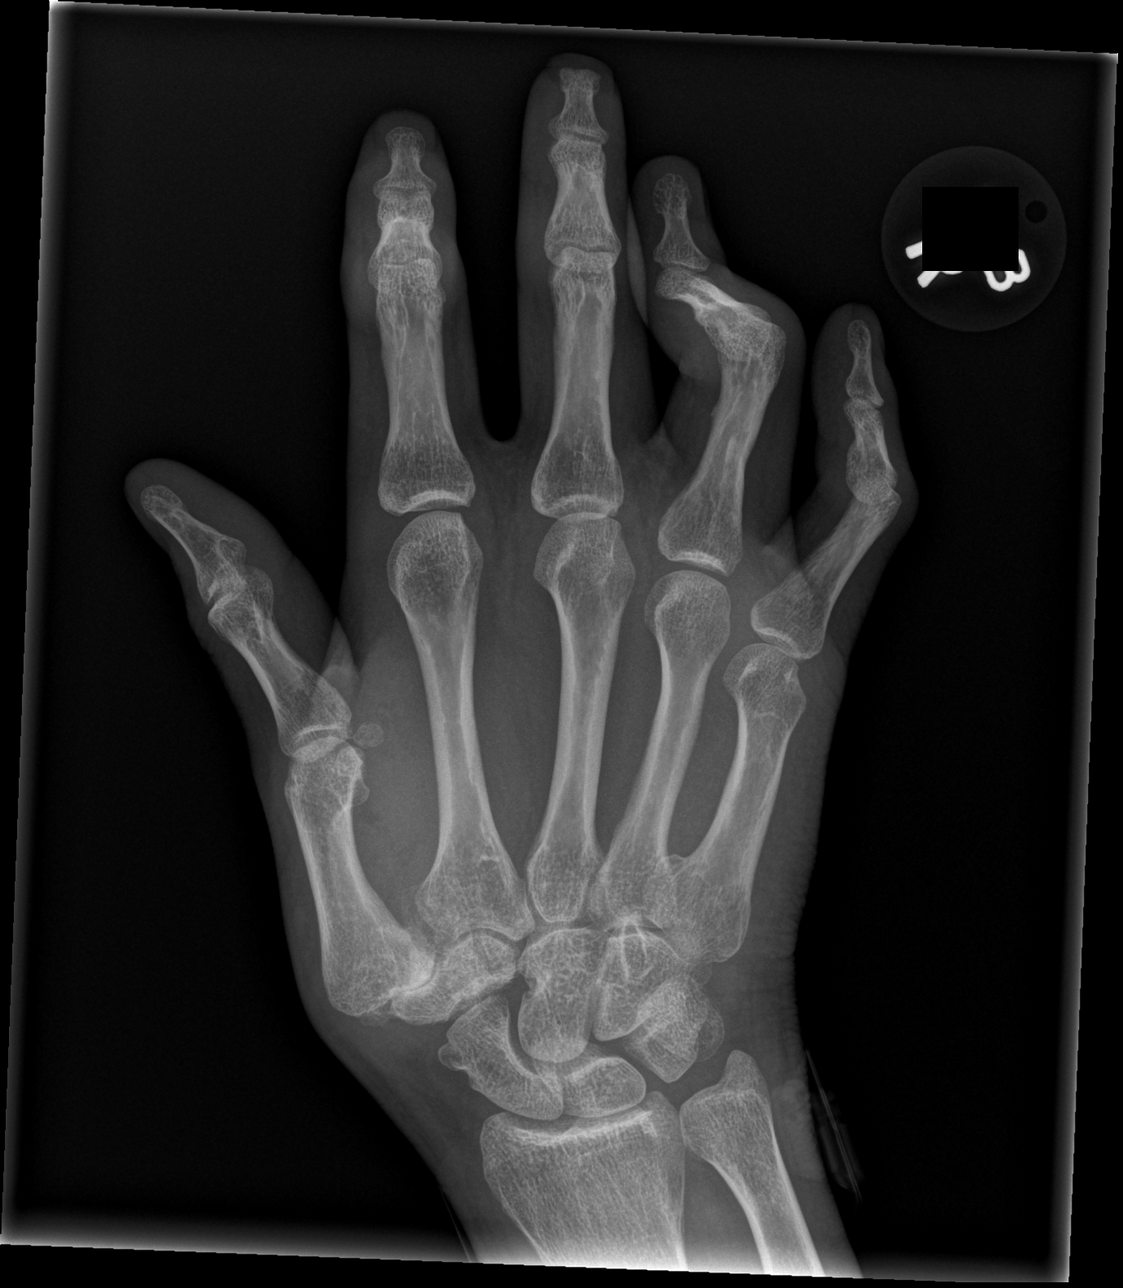
[im 2/3]
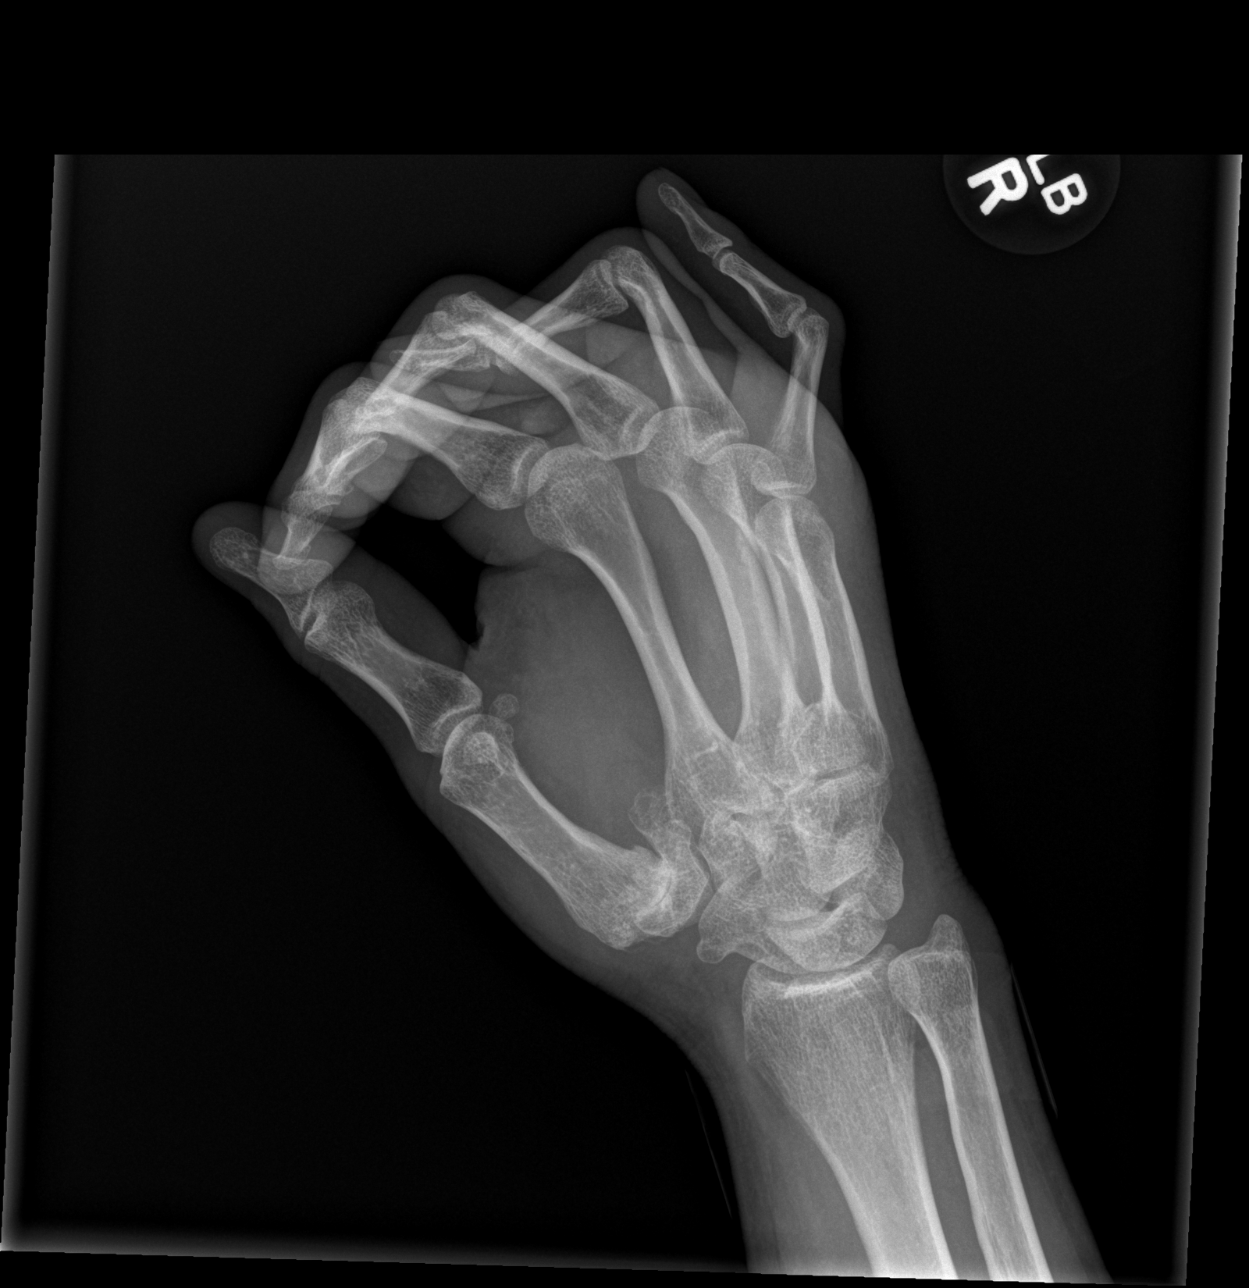
[im 3/3]
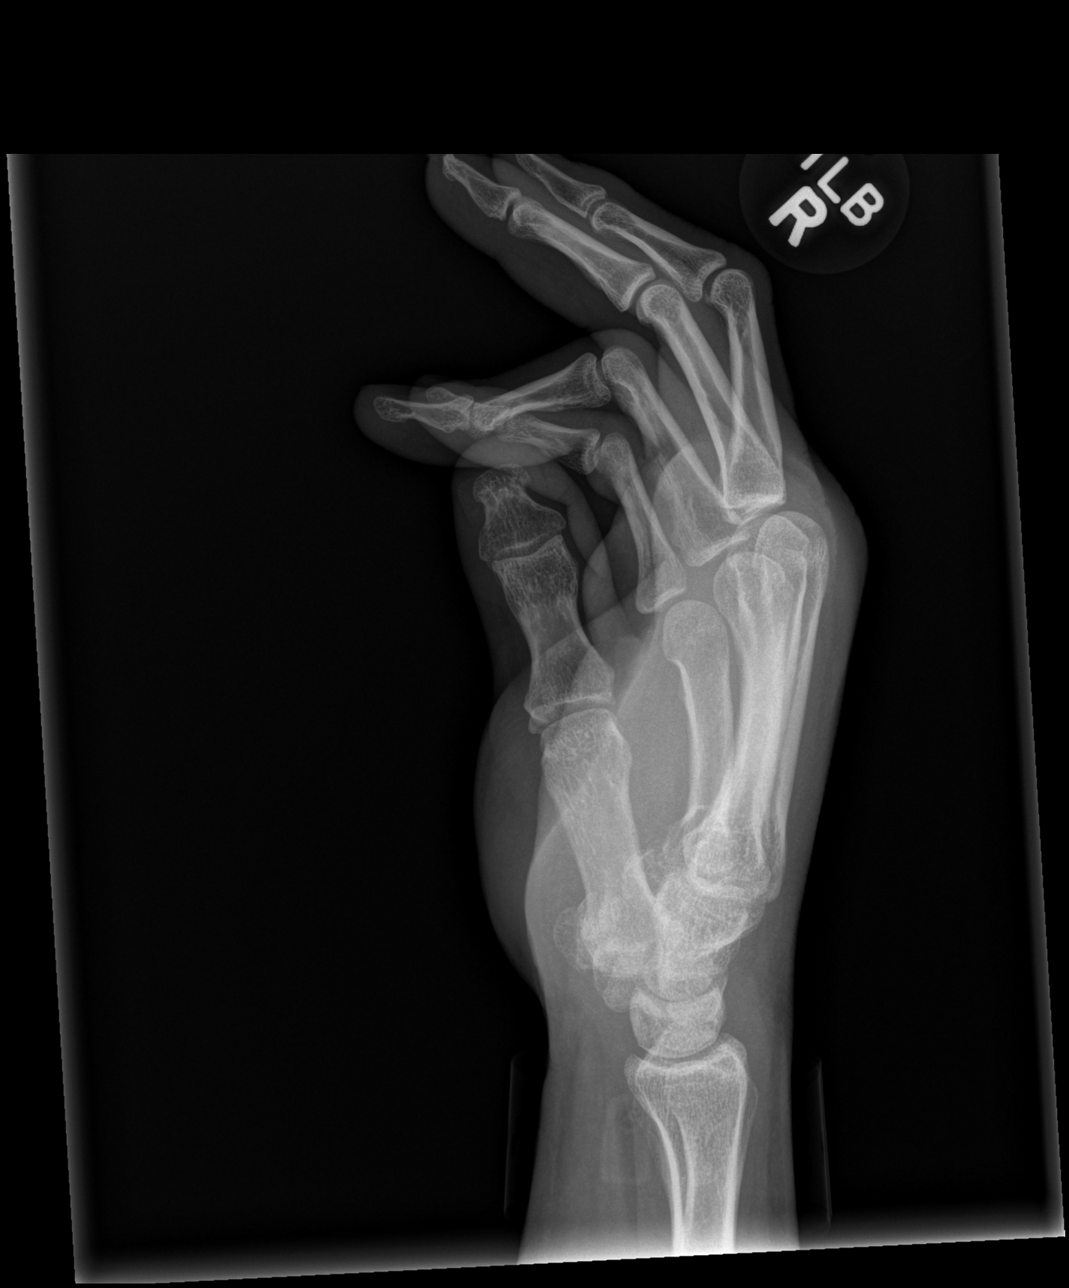

[3 of 3 positions shown; findings below may reference images not displayed]

PROCEDURE:     DXR - DXR HAND RT COMPLETE W/OBLIQUES  - January 30, 2013 [DATE]

RESULT:     Comparison is made to previous images of the right hand dated to
January 2013 and 09 June, 2012. There is deformity of the inner phalangeal
joint proximally in the fourth and fifth digits. Degenerative changes are
severe in the first carpometacarpal joint. No definite fracture, dislocation
or foreign body is evident.
IMPRESSION: Chronic changes as described. No acute bony abnormality
evident.

[REDACTED]

## 2015-03-10 NOTE — H&P (Signed)
PATIENT NAME:  [name], [name] MR#:  [id number] DATE OF BIRTH:  1975/12/27  DATE OF ADMISSION:  06/12/2012  REFERRING PHYSICIAN: Hilda Lias, MD    ADMITTING PHYSICIAN: Caryn Section, MD    REASON FOR ADMISSION: Status post overdose on insulin in suicide attempt.   IDENTIFYING INFORMATION: [name] is a 39 year old currently separated Caucasian male with a history of bipolar disorder and alcohol dependence who currently lives in a trailer in the [place] area alone. His girlfriend broke up with him 35 days ago. He is on disability for grand mal seizures and diabetes. He does not have any children.   HISTORY OF PRESENT ILLNESS: [name] is a 39 year old currently separated Caucasian male with history of bipolar disorder and alcohol dependence as well as at least two prior inpatient psychiatric hospitalizations who was admitted to the Critical Care Unit after having overdosed on insulin and cutting his left upper extremity with a knife. The patient had told the petitioner of the involuntary commitment that if he did not have his girlfriend then there was no point in living. It is unclear how much insulin the patient took. One record indicated that he had taken 500 units of insulin 70/30. To this Clinical research associate he said he took 20 units of insulin. The patient himself called 9-1-1 after overdosing. He does report problems with worsening depression including feelings of hopelessness, crying spells, difficulty with focus and concentration, low energy level, anhedonia, and insomnia over the past one month since his girlfriend left. He reports his appetite is decreased and he only eats about one meal per day but does not have a scale and cannot tell whether or not he has lost any weight. He has been drinking alcohol heavily on a daily basis. The patient had reported drinking close to six beers of alcohol daily, although other reports indicate that he was drinking close to a 12 pack daily. He does admit to shakes every  morning when he wakes up until he drinks at least three beers. He does have a history of alcohol-related blackouts as well as a DUI. He also does report a history of seizures that he says are grand mal seizures. It is still unclear whether or not these are alcohol withdrawal seizures. He denies any other illicit drug use including cocaine, cannabis, opiate, or stimulant use. Ethanol level in the Emergency Room was 197 and toxicology screen was negative for all substances. He does report a history of multiple suicide attempts in the past by overdose and prior records indicate also a history of self-injurious behaviors although the patient denies this. He denies any history of any psychosis including auditory or visual hallucinations. No paranoid thoughts or delusions. He does give a prior diagnosis of bipolar disorder but denies any history of any gross manic symptoms including decreased sleep for several days with increased goal directed behavior, hyperreligious thoughts, hypersexual behavior, grandiose delusions. He does report a history of irritability and anger outbursts. He was actually seen at the [facility] earlier this month for having punched his trailer. The patient says he was angry at his girlfriend at the time. He says that his girlfriend and him had been together for nine years but separated 35 days ago. He says that she cheated on him and is currently with someone else. He endorses the break-up with his girlfriend as being his primary stressor for worsening depressive symptoms.   PAST PSYCHIATRIC HISTORY: The patient has been hospitalized at [facility] in the past in  1990 for a period of 19 months. He says that that followed the divorce of his parents. He also was hospitalized briefly at [facility] in the past under the care of Dr. Jennet MaduroPucilowska on July 13th for a 24 hour period after threatening to kill himself with a gun. He is not currently followed by any outpatient psychiatrist. He  recognizes prior medications such as Prozac, Tegretol, lithium, and Valium as medications he has used in the past but is not currently on any medications. He has not ever been to any residential substance abuse treatment in the past.   SUBSTANCE ABUSE HISTORY: The patient does report a history of alcohol dependence since his early 3420s and does have a history of alcohol related blackouts. He is currently drinking on a daily basis somewhere between 6 to 12 packs of beer daily. He denies any history of cocaine, cannabis, opiate, or stimulant use. Toxicology screen in the Emergency Room was negative. He does smoke a half pack of cigarettes per day and has been smoking since the age of 39.   FAMILY PSYCHIATRIC HISTORY: He denies any history of any mental illness or substance use in the family.   PCP: The patient goes to Urgent Care Clinics only.    PAST MEDICAL HISTORY: 1. Hypertension.  2. Diabetes. 3. 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 4. Gastroesophageal reflux disease.   5. Asthma. 6. Alcoholic hepatitis. 7. He denies any history of any prior TBI.    OUTPATIENT MEDICATIONS:  1. Norvasc, unknown dosage. 2. Nexium 40 mg p.o. daily. 3. Ventolin inhaler 2 puffs q.6 p.r.n.  4. Tramadol 50 mg every six hours p.r.n.  5. The patient did have a prescription for Valium 5 mg t.i.d. that was given to him once at the Urgent Care Clinic.  6. He is also on insulin 70/30, unknown dosage.   ALLERGIES: Aspirin, codeine, Motrin, penicillin, tramadol.   SOCIAL HISTORY: The patient was born and raised in the [place] area and lived with his parents until they divorced when he was 69eight years old. He said he then went to [facility] for a [facility] month period and then lived with his father after that. He denies any history of any physical or sexual abuse. He says his father is still living and lives two trailers down from him but his  mother is deceased. He says he gets along fairly well with both parents. He does not have any siblings. He used to work in [employer] in the past and is currently on disability for seizure disorder. He has never been married and has no children. He lived with his girlfriend for nine years but separated from her approximately one month ago. Currently lives in a trailer in the [place] area alone.  LEGAL HISTORY: The patient has a history of one DUI in the past. He denies any current pending charges. He is not on probation.   MENTAL STATUS EXAM: [name] is a 39 year old obese Caucasian male with medium brown hair and a goatee who is lying in his hospital bed. He was somewhat lethargic during the interview but fully oriented to time, place, and situation. He gave very vague responses to questions asked and speech was minimal. Speech was fluent and coherent. Mood was described as being "not so good" and affect was flat. Thought processes were logical and goal directed. The patient gave very vague responses. He did admit to having had some passive suicidal thoughts when he overdosed  but denied any current suicidal thoughts or homicidal thoughts. He denied any current auditory or visual hallucinations. No paranoid thoughts or delusions. Attention and concentration were poor. Judgment and insight were limited especially with regards to degree of alcohol dependence. The patient was stating that he wanted to go home and did not feel like he needed treatment. Recall was 3 out of 3 initially and 1 out of 3 after five minutes. He could not do any serial sevens or spell world backwards. He named prior presidents as Licensed conveyancerbama and Bush. When asked about proverbs, the patient would not give a response.  SUICIDE RISK ASSESSMENT: At this time [name] remains at a moderately elevated risk of harm to self and others secondary to recent suicide attempt and ongoing alcohol dependence as well as noncompliance with outpatient  psychiatric treatment. He denies any access to guns. He does have a number of psychosocial stressors including being unemployed and on disability, lack of primary support, noncompliance, and recent break-up with his girlfriend.   REVIEW OF SYSTEMS: CONSTITUTIONAL: He does complain of fatigue and weakness as well as decreased appetite. He is unsure whether or not he has lost any weight. He denies any fever, chills, or night sweats. HEAD: He denies any headaches or dizziness. EYES: He denies any diplopia or blurred vision. ENT: He denies any hearing loss, neck pain, or throat pain. RESPIRATORY: He denies any shortness breath or cough. CARDIOVASCULAR: He denies any chest pain or orthopnea. GI: He does complain of some nausea and left lower quadrant abdominal pain. No vomiting. He denies any change in bowel movements. GU: He denies incontinence or problems with frequency of urine. ENDOCRINE: He denies any heat or cold intolerance. LYMPHATIC: He denies anemia or easy bruising. MUSCULOSKELETAL: He denies any muscle or joint pain. NEUROLOGIC: He denies any tingling or weakness. PSYCHIATRIC: Please see history of present illness.   PHYSICAL EXAMINATION:   VITAL SIGNS: Blood pressure 124/65, heart rate 86, respirations 33, temperature 97.8, pulse oximetry 88% on room air.   Please see initial physical exam as completed by Dr. Adrian SaranSital Mody.   LABORATORY, DIAGNOSTIC, AND RADIOLOGICAL DATA: Sodium 136, potassium 3.5, chloride 100, CO2 28, BUN 2, creatinine 0.77, glucose 111, total bilirubin 4.8, alkaline phosphatase 227, AST 291, ALT 98. Urine tox screen was negative for all substances. Urinalysis was nitrite and leukocyte esterase negative with less than 1 WBC, no bacteria. Serum albumin 2.9.   DIAGNOSES:  AXIS I:  1. Bipolar disorder, most recent episode depressed.  2. Alcohol use disorder, severe.  3. Nicotine dependence.   AXIS II: Deferred.   AXIS III:  1. Hypertension.  2. Diabetes. 3. Grand mal  seizures.  4. Gastroesophageal reflux disease.  5. Asthma.  6. Alcoholic hepatitis.   AXIS IV: Severe. Recent break-up with girlfriend, lack of primary support, noncompliance with psychiatric treatment, history of legal problems.   AXIS V: GAF at present equals 25.   ASSESSMENT AND TREATMENT RECOMMENDATIONS: [name] is a 39 year old currently separated Caucasian male with a history of bipolar disorder as well as alcohol dependence who was admitted to the Critical Care Unit after overdosing on insulin intentionally and making cut to his left upper extremity in an intentional suicide attempt. The patient does report worsening depressive symptoms secondary to the break-up with his girlfriend and alcohol use has been escalating in the past one month to the point where the patient does have withdrawal symptoms when he wakes up every morning and has to drink to help control  withdrawal symptoms. Will admit to Inpatient Psychiatry for medication management, safety, and stabilization once medically cleared.  1. Bipolar disorder, most recent episode depressed. Due to elevated liver enzymes and history of diabetes, will avoid Depakote, Tegretol, and lithium and look at possibly starting Geodon for mood stabilization and trazodone for insomnia. Will get EKG to rule out QTc prolongation. EKG on admission to the Critical Care Unit showed QTc interval of 450. Will check lipid panel in the a.m. as well as B12 and folic acid.  2. Alcohol dependence. The patient will be started on Ativan per [facility] as well as given multivitamin, thiamine, and folic acid for alcohol withdrawal. Will refer for residential substance abuse treatment. He was advised to abstain from alcohol and all illicit drugs as they may worsen mood symptoms.  3. Diabetes. Due to overdose on insulin, will hold insulin for now and look at restarting insulin in the next few days. Will most likely consult Medicine for assistance as the patient is unclear on  how much insulin he takes. Will check hemoglobin A1c as well and place on carbohydrate limited diet and sliding scale insulin.  4. Hypertension. Blood pressure is currently stable. The patient believes he takes Norvasc. Will wait until overdose clears from system and then plan to restart Norvasc with elevated blood pressures. 5. Laceration to left upper extremity. Will do wound care on a daily basis currently.  6. History of seizure disorder. Will place on seizure precautions. Due to elevated LFTs, will avoid Depakote and Tegretol. Seizures may be more withdrawal related. Will consider starting phenytoin if needed.  7. Elevated LFTs. Will monitor LFTs and check hepatitis panel as well as right upper quadrant ultrasound. Most likely elevated LFTs are related to alcohol use.  8. Disposition. Will refer for residential substance abuse treatment at [facility].   ____________________________ Doralee Albino. Maryruth Bun, MD akk:[id number] D: 06/13/2012 11:26:11 ET T: 06/13/2012 11:51:39 ET JOB#: [id number]  cc: Kaimani Clayson K. Maryruth Bun, MD, <Dictator>

## 2015-03-10 NOTE — Consult Note (Signed)
PATIENT NAME:  [name], [name] MR#:  [id number] DATE OF BIRTH:  [birth year]  DATE OF CONSULTATION:  07/10/2012  REFERRING PHYSICIAN:  Marlaine Hind, MD CONSULTING PHYSICIAN:  Venida Jarvis, MD  REASON FOR CONSULTATION: Insulin overdose.  CHIEF COMPLAINT: "I took too much insulin accidentally."    HISTORY OF PRESENT ILLNESS: The patient is a 39 year old white single male with history of possible bipolar disorder although we have not gotten a definite manic history. The patient had definite suicide attempt with insulin when he broke up with his girlfriend on 06/25/2012. he was hospitalized on psychiatry from 07/24 to 06/25/2012 and then went to [facility] for about 10 days and has been out 5 days, and that is for alcohol abuse dependence. The patient reports that he was attempting to take his usual 6 units of insulin but mistakenly brought 60 units up and injecting himself with that. When he realized what he had done, he called 911. An ambulance came and took him to the hospital where they reported what he did. He was drinking some at the time, although he denies very much. His alcohol blood level was 0.125, initially he said he had one or two beers but then he said less than a six-pack.  The patient denies to me current symptoms of depression including depressed mood. He reports sleep, interest, energy, concentration, and appetite are okay. No suicidal thoughts although he has had them in the past. Previous suicide attempt was after a break-up with a girlfriend. He said he is reconciled with that now. Also no recent history of mania. The patient reports drinking only one day since he has been out of [facility], in the last 4 or 5 days.   The patient also had some opiates in his urine. He denies taking any opiates, just is on pain medication Tramadol apparently which is prescribed for him.   FAMILY PSYCHIATRIC HISTORY: Negative according to the patient.   PAST MEDICAL HISTORY: He has a history of recurrent  depression and possible bipolar disorder but with no definitive manic history, alcohol abuse, high blood pressure, diabetes mellitus type 2 insulin-dependent, asthma, tobacco abuse, and gastroesophageal reflux disease.      MEDICATIONS ON ADMISSION:  1. Zoloft 100 mg per day. 2. Inderal 10 mg twice a day. 3. Pro Air HFA two puffs every four hours p.r.n. 4. Pravastatin 10 mg a day. 5. Norvasc 10 mg a day. 6. Nexium 40 mg a day. 7. Naltrexone 50 mg a day, which he reports taking.  8. Divalproex 300 mg once a day.  9. Multivitamin once a day. 10. Metformin 2000 mg a day. 11. Lisinopril 40 mg a day. 12. Insulin NovoLog 70/30, 6 to 8 units in the morning and 6 to 8 units in the evening. 13. Neurontin 300 mg three times daily. 14. Hydroxyzine 25 mg four times daily.  DRUG ALLERGIES: Codeine causes nausea and vomiting, Motrin causes GI distress, aspirin and penicillin causes nausea, vomiting and diarrhea, and Tramadol as well.   SOCIAL HISTORY: The patient was born and raised in [place]. His parents were divorced. He did have a long stay at [facility] as a child for 19 months. He continues to get along fairly well with both his parents. He lives in a trailer by himself. He has no siblings. He formerly worked in [employer], now on disability. Never married. No children. He is a cigarette smoker.   MENTAL STATUS: Examination revealed a white male who looked his stated age, cooperative and  coherent and able to give me a fairly good history. He did not appear to be depressed. There was no psychomotor retardation and no depressive ideation at this time. His story was at least in part believable to me as he denied suicidal thoughts and had admitted to those in the past. He was obviously minimizing the amount of alcohol he did take, also I am not sure about the positive opioids. No hallucinations or delusions. Insight and judgment was fair. He is well groomed and aware of his surroundings, oriented  x4, remembered three of three objects at 1 and 3 minutes.  SUICIDE RISK ASSESSMENT: Current attempt probably negative. Impulsivity positive. Ideation, intention and plan negative. Panic negative. High anxiety and turmoil negative. Anhedonia negative. Concentration negative. Hopelessness negative. Insomnia negative. Energy negative. Treatment alliance negative. Illness and pain is positive. Prior attempts positive. Substance abuse positive. Family history negative. Childhood abuse negative. Marital status positive. Family and children positive. Firearms negative. Age greater than 65 negative. Gender male positive. Short-term risk is probably low. Long-term is at least moderate.   DIAGNOSES:   AXIS I:  1. Probable accidental overdose.  2. Possible history of bipolar but certainly recurrent depression, not depressed at present.  3. Alcohol dependence.   RECOMMENDATIONS: The patient probably does not need psychiatric hospitalization. Continue on the same psychiatric medication. He needs referral to outpatient mental health. [facility] would be a good one if he has not gone there already. ____________________________ Venida JarvisWilliam James Anthoni Geerts II, MD wjr:slb D: 07/10/2012 15:49:20 ET T: 07/10/2012 16:08:36 ET JOB#: [id number]  cc: Venida JarvisWilliam James Missey Hasley II, MD, <Dictator> Jules HusbandsWILLIAM J Miyanna Wiersma MD ELECTRONICALLY SIGNED 07/11/2012 16:39

## 2015-03-10 NOTE — Consult Note (Signed)
Referring Physician:  Cephus Shelling :   Primary Care Physician:  Cephus Shelling : [address] [place], [place], [place] [address]  Reason for Consult:  Admit Date: 19-Jun-2012   Chief Complaint: possible seizures   History of Present Illness:  History of Present Illness:   39 yo RHD M presents to [facility] on 06/13/12 secondary to suicide attempt at which he superficially cut his wrist and also took an overdose of insulin but called 911 himself.  Since being here, he was evaluated by medicine and determined to be medically stable with only moderately elevated liver dysfunction which has improved.  Yesterday, pt had some questionable seizure activity followed by confusion and there was a concern for seizures.  states that his seizures started 3 years ago when his grandfather died.  Pt states that people tell him that he shakes all over for anywhere from 5 to 20 minutes.  Pt does not remember these episodes.  He does note incontinence with a few of these episodes.  More importantly, pt denies any injuries or tongue biting.  There are no witnesses of actual activity.  ROS:   General denies complaints    HEENT no complaints    Lungs no complaints    Cardiac no complaints    GI no complaints    GU no complaints    Musculoskeletal no complaints    Extremities no complaints    Skin no complaints    Neuro no complaints    Endocrine no complaints    Psych depression   Past Medical/Surgical Hx:  Diabetes Mellitus,Type I (IDD):   GERD - Esophageal Reflux:   diverticulosis:   Hypertension:   Seizures:   Asthma:   Denies surgical history.:   Past Medical/ Surgical Hx:   Past Medical History as above    Past Surgical History none   Home Medications: Medication Instructions Last Modified Date/Time  Nexium 40 mg oral delayed release capsule 1 cap(s) orally once a day 24-Jul-13 19:20  novolin 70/30  24-Jul-13 19:20  tramadol 50 mg oral tablet 1 tab(s) orally every 6 hours, As  Needed 24-Jul-13 19:20  Ventolin inhaler 2 puffs q6h prn 24-Jul-13 19:20  Norvasc  orally  24-Jul-13 19:20   Allergies:  Codeine: N/V  Motrin: GI Distress  Aspirin: N/V/Diarrhea  Penicillin: N/V/Diarrhea  Tramadol: Other  Allergies:   Allergies as above   Social/Family History:  Employment Status: disabled   Lives With: alone   Living Arrangements: mobile home   Social History: admits to drinking 6 beers a day, still smokes, no illicits   Family History: no hx of seizures   Vital Signs: **Vital Signs.:   30-Jul-13 06:19   Vital Signs Type q8   Temperature Temperature (F) 98.7   Celsius 37   Systolic BP Systolic BP 638   Diastolic BP (mmHg) Diastolic BP (mmHg) 87   Pulse Lying Pulse Lying 74   Systolic BP Systolic BP 937   Diastolic BP (mmHg) Diastolic BP (mmHg) 94   Pulse Pulse Sitting 76   Systolic BP Systolic BP 342   Diastolic BP (mmHg) Diastolic BP (mmHg) 74   Pulse Standing Pulse Standing 95   Pulse Ox % Pulse Ox % 98   Physical Exam:  General: dishevaled appearing, NAD, nl weight   HEENT: normocephalic, sclera nonicteric, oropharynx clear but no teeth   Neck: supple, no JVD, no bruits   Chest: CTA B, no wheezes, good movement   Cardiac: RRR, no murmurs, no edema, 2+  pulses   Extremities: no C/C/E, FROM   Neurologic Exam:  Mental Status: A+Ox4, nl speech and language   Cranial Nerves: PERRLA, EOMI, nl VF, face symmetric, tongue midline, shoulder shrug equal   Motor Exam: 5/5 B normal, tone, mild high frequency tremor   Deep Tendon Reflexes: 2+/4 B, plantars downgoing B, no Hoffman   Sensory Exam: intact to pinprick, temperature, and vibration intact B   Coordination: FTN and HTS WNL, nl RAM, nl gait   Lab Results: Hepatic:  28-Jul-13 10:09    Bilirubin, Total  3.6   Alkaline Phosphatase  208   SGPT (ALT) 51 (12-78 NOTE: NEW REFERENCE RANGE 10/14/2011)   SGOT (AST)  100   Total Protein, Serum 7.0   Albumin, Serum  2.8   Bilirubin, Direct  2.5  (Result(s) reported on 17 Jun 2012 at 11:06AM.)  Routine Chem:  [id number]-TGY-[id number] 38:93    Folic Acid, Serum 7.6 (Result(s) reported on 14 Jun 2012 at 08:20AM.)   Cholesterol, Serum  233   Triglycerides, Serum  208   HDL (INHOUSE)  < 2   VLDL Cholesterol Calculated  42   LDL Cholesterol Calculated SEE COMMENT   Result Comment LDL - UNABLE TO CALCULATE VALUE DUE TO NON-  - NUMERIC VALUE WITHIN THE CALCULATION.  Result(s) reported on 14 Jun 2012 at 08:20AM.   Hemoglobin A1c [facility]) 4.4 (The American Diabetes Association recommends that a primary goal of therapy should be <7% and that physicians should reevaluate the treatment regimen in patients with HbA1c values consistently >8%.)  28-Jul-13 10:09    Glucose, Serum  103   BUN 7   Creatinine (comp) 0.75   Sodium, Serum 139   Potassium, Serum 4.1   Chloride, Serum 105   CO2, Serum 27   Calcium (Total), Serum 8.9   Osmolality (calc) 276   eGFR (African American) >60   eGFR (Non-African American) >60 (eGFR values <25mL/min/1.73 m2 may be an indication of chronic kidney disease (CKD). Calculated eGFR is useful in patients with stable renal function. The eGFR calculation will not be reliable in acutely ill patients when serum creatinine is changing rapidly. It is not useful in  patients on dialysis. The eGFR calculation may not be applicable to patients at the low and high extremes of body sizes, pregnant women, and vegetarians.)   Anion Gap 7  30-Jul-13 06:37    Ammonia, Plasma  33 (Result(s) reported on 19 Jun 2012 at 07:29AM.)  Routine UA:  29-Jul-13 12:24    Color (UA) Amber   Clarity (UA) Clear   Glucose (UA) Negative   Bilirubin (UA) Negative   Ketones (UA) Negative   Specific Gravity (UA) 1.017   Blood (UA) Negative   pH (UA) 5.0   Protein (UA) 30 mg/dL   Nitrite (UA) Negative   Leukocyte Esterase (UA) Negative (Result(s) reported on 18 Jun 2012 at 01:04PM.)   RBC (UA) <1 /HPF   WBC (UA) <1 /HPF   Bacteria (UA) NONE SEEN    Epithelial Cells (UA) NONE SEEN   Mucous (UA) PRESENT (Result(s) reported on 18 Jun 2012 at 01:04PM.)   Impression/Recommendations:  Recommendations:   labs reviewed and significant for elevated LFT's US showed fatty liver d/w primary psychiatric physician   Possible seizures-  given the history from patient, 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  The fact that they started at age 56, never involved injuries, last up to 20 minutes and  occured after his grandfather died are all reasons to believe that they are PNEA.   Bipolar d/o-  appears more depressed than manic without any manic episodes noted Alcohol abuse-  this is worse than patient is telling us if hepatic enzymes are so high and he has fatty changes Diabetes Mellitus-  controlled d/c Keppra as this can worse depression and bipolar start Trileptal $RemoveBeforeD'300mg'jhLOQoHpMgitMv$  BID x 1 week then increase to $RemoveBef'600mg'DWVsacwwzU$  BID this will help with bipolar and give seizure control recommend long-term seizure monitoring at [facility] to accertain exactly what these episodes are  nursing staff should be able to describe the episodes that they see so we can document pt advised to stop drinking  pt should be exiting EtOH withdrawal seizure time frame no driving or operating heavy machinery x 6 months will follow up EEG results, look for addendum but will not see pt any more unless there are continued episodes  Electronic Signatures: Jamison Neighbor (MD)  (Signed 30-Jul-13 09:30)  Authored: REFERRING PHYSICIAN, Primary Care Physician, Consult, History of Present Illness, Review of Systems, PAST MEDICAL/SURGICAL HISTORY, HOME MEDICATIONS, ALLERGIES, Social/Family History, NURSING VITAL SIGNS, Physical Exam-, LAB RESULTS, Recommendations   Last Updated: 30-Jul-13 09:30 by Jamison Neighbor (MD)
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
## 2015-03-10 NOTE — Discharge Summary (Signed)
PATIENT NAME:  [name], [name] MR#:  [id number] DATE OF BIRTH:  17-Jun-1976  DATE OF ADMISSION:  06/12/2012 DATE OF DISCHARGE:  06/13/2012  For a detailed note, please take a look at the history and physical done by Dr. Adrian SaranSital Mody.   DISPOSITION: Patient is being discharged to Behavioral Medicine.   DIAGNOSES AT DISCHARGE:  1. Hypoglycemia secondary to increasing intake of insulin.  2. Suicide attempt with history of depression. 3. Seizures/pseudoseizures.  4. History of alcohol abuse.  5. Tobacco abuse.  6. Abnormal liver function tests secondary to acute alcoholic hepatitis.  7. Gastroesophageal reflux disease.   DIET: Patient is being discharged on a regular diet.   ACTIVITY: As tolerated.   DISCHARGE MEDICATIONS:  1. Nexium 40 mg daily.  2. Thiamine 100 mg daily.  3. Folic acid 1 mg daily.  4. Nicotine patch 14 mg transdermally daily.   BRIEF HOSPITAL COURSE: This is a 39 year old male with past medical history of diabetes, history of depression and GERD who presented to the hospital secondary to a suicide attempt.  1. Altered mental status. This was secondary to hypoglycemia secondary to increasing intake of insulin as a suicide attempt. Patient was brought to the Emergency Room, put on D5 drip. Has been maintained on that, has had no further episodes of hypoglycemia. His mental status is now back down to baseline.  2. Seizures. This was likely secondary to hypoglycemia. Since his blood sugars improved he has had no further episodes of seizures. He was placed on CIWA as he has history of alcohol abuse, although he does not had any evidence of alcohol withdrawal seizures while in the hospital.  3. Suicide attempt/depression. Patient has had previous suicide attempts in the past, has been admitted to Behavioral Medicine. He was involuntary committed. Psychiatry consult was obtained. Patient was seen by Dr. Caryn SectionAarti Kapur and she has accepted the patient and patient is being discharged for  further inpatient psychiatric care.  4. Diabetes. Patient has not been compliant with his medications. He did have hypoglycemia. Currently he is not being discharged on any insulin and his insulin can be restarted once his blood sugars improve.  5. Gastroesophageal reflux disease. Patient was maintained on his Nexium and he will resume that.  6. CODE STATUS: Patient is a FULL CODE.   TIME SPENT: 40 minutes.  ____________________________ Rolly PancakeVivek J. Cherlynn KaiserSainani, MD vjs:cms D: 06/13/2012 15:31:14 ET T: 06/14/2012 11:51:54 ET JOB#: [id number]  cc: Rolly PancakeVivek J. Cherlynn KaiserSainani, MD, <Dictator> Houston SirenVIVEK J Cordarius Benning MD ELECTRONICALLY SIGNED 06/15/2012 16:19

## 2015-03-10 NOTE — Discharge Summary (Signed)
PATIENT NAME:  [name], [name] MR#:  [id number] DATE OF BIRTH:  1976/03/10  DATE OF ADMISSION:  08/10/2012 DATE OF DISCHARGE:  08/13/2012  PRIMARY CARE PHYSICIAN: None.   DISCHARGE DIAGNOSIS: Suicide attempts with insulin intoxication.  HOSPITAL COURSE: The patient is a 39 year old Caucasian male with history of diabetes, hypertension, bipolar disorder, depression, and multiple admissions in the past few months regarding overdosing for various reasons and multiple psychiatric admissions in the past.   He was brought to the ER because he was found unresponsive and his blood sugar was 16 by EMS. EMS also found two empty pens of regular insulin, FlexPen aspart each of 300 units by him so possibly he had taken 600 units of short-acting insulin. On arrival his blood sugar level was as low as 16. He was started on D10 and he regained his consciousness when sugar level came up. He said that his blood sugar was labile and it was high and low and so he continued taking insulin but he denied trying to hurt himself which is questionable as he is having severe depression right now. He was admitted on the medical floor and he was continued on D10 IV drip at 100 mL per hour. For the initial few hours his blood sugar remained more than 100 with the drip so the next day his drip was decreased to 50 mL per hour of D10 and his blood sugar was maintained, then later on switched to D5 NS and finally after two days it was completely stopped. His blood sugar remained stable between 100 to 125 without any IV fluids and he remained stable medically.   Other medical issues addressed during this hospital admission were depression. All depression medications were on hold and Psych consult was called. Dr. Guss Bunde decided to transfer him for inpatient psych once he was medically stable and his sugar level remained under control.  For hypertension, he was continued on amlodipine and clonidine. For nausea he was given Reglan p.r.n.  and for seizures he was continued on Tegretol. After two days of medical stay his blood sugar level remained stable without any support from IV fluid and he was transferred to inpatient psychiatry for further management of his depression and bipolar disorder.   CONDITION ON DISCHARGE: Stable.   CODE STATUS: FULL CODE.   MEDICATIONS TO BE CONTINUED ON DISCHARGE:  1. Propranolol 10 mg oral tablet 2 times a day.  2. Lisinopril 40 mg once a day.  3. Pravastatin 10 mg once a day.  4. ProAir 90 mcg inhalation 2 puffs 4 times a day as needed for wheezing. 5. Gabapentin 600 mg oral tablet 3 times a day. 6. Multivitamin 1 tablet once a day.  7. Omeprazole 20 mg delayed-release capsule once a day. 8. Amlodipine 10 mg once a day. 9. Clonidine 0.1 mg 3 times a day. 10. Carbamazepine 200 mg capsule 1 capsule 2 times a day.  As he was getting transferred to the psychiatry floor, we recommend no antidiabetic medication at this time because his blood sugar level was ranging from 100 to 120 for the last 24 hours without any medication or IV fluids. We recommend to continue checking blood sugar levels before meals three times a day and if they remain in the high range then he can be started for antidiabetic medications or insulin.    total time spent 45 min. ____________________________ [name]. Shane Pigeon, MD [id number] D: 08/13/2012 17:05:48 ET T: 08/15/2012 14:46:01 ET JOB#: [id number]  cc: [name]. Shane Pigeon,  MD, <Dictator> Shane DillingVAIBHAVKUMAR Kalob Bergen MD ELECTRONICALLY SIGNED 09/11/2012 18:02

## 2015-03-10 NOTE — H&P (Signed)
PATIENT NAME:  [name], COTHERN MR#:  [id number] DATE OF BIRTH:  Jul 04, 1976  DATE OF ADMISSION:  08/13/2012  REFERRING PHYSICIAN: Delfino Lovett, M.D. ATTENDING PHYSICIAN: Kristine Linea, M.D.   IDENTIFYING DATA: [name] is a 39 year old male with history of mood instability and multiple suicide attempts.   CHIEF COMPLAINT: "I want to go home."   HISTORY OF PRESENT ILLNESS: [name] was admitted to the medical floor for hypoglycemia resulting from unintentional overdose on insulin. Per the medical chart, the patient could have taken up to 600 units of insulin. He denies that it was a suicide attempt. He explained that he was confused, couldn't see well, and could have overdrawn his insulin. His blood alcohol level on admission was slightly elevated. The patient has a history of alcoholism but he denies heavy drinking recently. He was hospitalized at [facility] for depression and suicide attempt at the beginning of August. He was discharged to [facility] residential substance abuse treatment facility for treatment of alcoholism. He was released from a treatment center three weeks ago. He reports that he was sober for the first two weeks then he started drinking a little bit. He denies being drunk on the day of the overdose. He denies any symptoms of depression initially, then he admits to poor sleep, decreased appetite, anhedonia, feeling of guilt, hopelessness, worthlessness, poor energy and concentration, crying spells, heightened anxiety, and suicidal thoughts. He has been very depressed since his girlfriend of 10 years left him in July. Apparently she had been taking care of the patient and all business in the household, allowing him to feel sick. He admits that present suicide attempt was attention-seeking. It is unclear from whom, as he has no contact with his ex-girlfriend. Following the overdose he called EMS and was taken to the hospital. He denies psychotic symptoms. He denies  other than alcohol, illicit substance or prescription pill misuse.   PAST PSYCHIATRIC HISTORY: There were several hospitalizations including extended stay at [facility] for 19 months. This happened when he was a young child following the divorce of his parents. He has been hospitalized a couple of times at [facility] He has been admitted multiple times to the medical floor after "accidental " insulin overdose and hypoglycemia. He does have a history of drinking but reports that he has been able to slow down lately. He has been tried on numerous medications including Prozac, Tegretol, lithium, and Valium.   FAMILY PSYCHIATRIC HISTORY: None reported.   PAST MEDICAL HISTORY:  1. Hypertension. 2. Diabetes.  3. Gastroesophageal reflux disease.  4. Asthma.  5. Alcoholic hepatitis.  6. History of pseudoseizures.   ALLERGIES: Aspirin, codeine, Motrin, penicillin, and tramadol.   MEDICATIONS ON ADMISSION:  1. Albuterol 2 puffs every four hours as needed.  2. Amlodipine 5 mg daily.  3. Prilosec 40 mg daily.  4. Thiamine 100 mg daily.  5. Pravastatin 10 mg daily.  6. Metformin 500 mg daily.  7. Seroquel 150 mg at bedtime. 8. Trileptal 300 mg twice daily.  9. Zoloft 100 mg daily.  10. Novolin R insulin.  MEDICATIONS AT THE TIME OF TRANSFER FROM THE MEDICAL FLOOR:  1. Albuterol inhaler as needed.  2. Norvasc 10 mg daily.  3. Tegretol 200 mg twice daily.  4. Neurontin 600 mg 3 times daily.  5. Hydroxyzine 50 mg 4 times daily.  6. Lisinopril 40 mg daily.  7. Prilosec 20 mg daily.  8. Pravachol 10 mg at bedtime.  9.  Propranolol 10 mg twice daily.  10. Zoloft 100 mg daily.  11. Insulin and metformin were discontinued until his blood sugars are elevated.    SOCIAL HISTORY: He is alone now after a 10-year relationship dissipated. He lives in a trailer. His father lives nearby and they are in daily contact. The patient has Medicare and Medicaid. He has never  been compliant with his psychiatric follow-up appointments. As above, there is a history of drinking.   REVIEW OF SYSTEMS:  CONSTITUTIONAL: No fevers or chills. No weight changes. EYES: No double or blurred vision. ENT: No hearing loss. RESPIRATORY: Positive for occasional shortness of breath. CARDIOVASCULAR: No chest pain or orthopnea. GI: No abdominal pain, nausea, vomiting, or diarrhea. GU: No incontinence or frequency. ENDOCRINE: No heat or cold intolerance. LYMPHATIC: No anemia or easy bruising. INTEGUMENT: No acne or rash.  MUSCULOSKELETAL: No muscle or joint pain. NEUROLOGIC: No tingling or weakness.  PSYCHIATRIC: See history of present illness for details.   PHYSICAL EXAMINATION:  VITAL SIGNS: Blood pressure 151/97, pulse 76, respirations 18, temperature 98.6.   GENERAL: This is a slightly obese male in no acute distress.   HEENT: The pupils are equal, round, and reactive to light. Sclerae anicteric.   NECK: Supple. No thyromegaly.   LUNGS: Clear to auscultation. No dullness to percussion.   HEART: Regular rhythm and rate. No murmurs, rubs, or gallops.   ABDOMEN: Soft, nontender, nondistended. Positive bowel sounds.   MUSCULOSKELETAL: Normal muscle strength in all extremities.   SKIN: No rashes or bruises.   LYMPHATIC: No cervical adenopathy.   NEUROLOGIC: Cranial nerves II through XII are intact.   LABORATORY DATA: Chemistries are within normal limits. Blood alcohol level on admission 0.148. LFTs within normal limits. Cardiac enzymes are negative. Tegretol level 11.2. Urine tox screen negative for substances. CBC within normal limits. Urinalysis is not suggestive of urinary tract infection. Serum acetaminophen less than 3. Serum salicylates 6.4. Blood glucose on admission was 16.   MENTAL STATUS EXAMINATION ON ADMISSION: The patient is alert and oriented to person, place, time, and situation. He is pleasant, polite, and cooperative. There is severe psychomotor retardation. He  is unkempt with strong body odor. He is wearing hospital scrubs. There is minimal eye contact. His speech is soft. Mood is depressed with flat affect. Thought processing is slow. Thought content: He denies suicidal or homicidal ideation including recent suicide attempt by overdose. There are no delusions or paranoia. There are no auditory or visual hallucinations. His cognition is impaired or the patient is unwilling to participate. His insight and judgment are poor.   SUICIDE RISK ASSESSMENT ON ADMISSION: This is a patient with a long history of mood instability and multiple, multiple suicide attempts by insulin overdose, especially since his girlfriend left him. He always denies suicide intent and claims it was an error.   DIAGNOSES:  AXIS I:   1. Bipolar affective disorder, most recent episode depressed.  2. Alcohol dependence.   AXIS II: Personality disorder, not otherwise specified.   AXIS III:  1. Hypertension.  2. Diabetes. 3. Pseudoseizures. 4. Gastroesophageal reflux disease. 5. Asthma. 6. Alcoholic hepatitis.   AXIS IV: Mental illness, substance abuse, primary support, recent loss.   AXIS V: Global assessment of functioning score on admission: 25.   PLAN: The patient was admitted to [facility] Medicine unit for safety, stabilization, and medication management. He was initially placed on suicide precautions and will be closely monitored for any unsafe behaviors. He underwent full psychiatric  and risk assessment. He received pharmacotherapy, individual and group psychotherapy, substance abuse counseling, and support from therapeutic milieu.  1. Suicidal ideation: The patient denies.  2. Mood: We will continue all medication as prescribed in the community. We will make arrangements for a follow-up appointment in hopes that the patient will attend for once.  3. Medical: We will continue all medications as prescribed by his primary  provider. 4. Diabetes: His blood sugars were low, around 100. It was recommended by the medical team to not restart the patient on metformin or insulin until necessary. He will follow up with his outpatient provider.  5. Substance abuse treatment: The patient relapsed on alcohol. He  just completed [facility] treatment. He does not require detox. He declines participation in any substance abuse treatment program at present.  6. Disposition: He will return to home.  7. Followup: [facility] Neysa Bonitohristy Autumn on 08/15/2012 at 3:45 in the afternoon.     ____________________________ Ellin GoodieJolanta B. Jennet MaduroPucilowska, MD jbp:bjt D: 08/14/2012 18:32:51 ET T: 08/15/2012 06:56:51 ET JOB#: [id number]  cc: Katlynne Mckercher B. Jennet MaduroPucilowska, MD, <Dictator> Shari ProwsJOLANTA B Yasemin Rabon MD ELECTRONICALLY SIGNED 08/17/2012 1:57

## 2015-03-10 NOTE — H&P (Signed)
PATIENT NAME:  [name], [name] MR#:  [id number] DATE OF BIRTH:  1976-03-18  DATE OF ADMISSION:  06/12/2012  PRIMARY CARE PHYSICIAN: None.  CHIEF COMPLAINT: Insulin overdose in a suicidal attempt.   HISTORY OF PRESENT ILLNESS: This is a 39 year old male with a history of hypertension, diabetes, anxiety who does not take his medications who unfortunately separated from his wife of over 10 years about a month ago. Since that time he has been very depressed. Today he felt suicidal. He slashed his left arm and then he says he injected 250 units of 70/30 insulin. This was apparently expired since he has not picked up his medications in over a year. He did call 911. When 911 arrived his blood sugar was 52. When he got here it was 77, however, at one point it was 32 and at that time he had a shaking "seizure spell". 1 mg of Ativan was given as well as an amp of D50. Another time he had the same very similar symptoms when his blood sugar was 52, 0.5 amp and another mg of Ativan was given. These don't appear to be true seizures per the ER physician and the nurse, just a shaking spell probably from this hypoglycemia. He is currently on D10 with 150 mL.   REVIEW OF SYSTEMS: CONSTITUTIONAL: Denies any fever. Positive weakness. No fatigue. EYES: No blurry or double vision, glaucoma. ENT: No ear pain, hearing loss, seasonal allergies. Positive snoring. RESPIRATORY: No cough, wheezing, hemoptysis, dyspnea. CARDIOVASCULAR: Denies any chest pain, palpitations, orthopnea, edema, arrhythmia, dyspnea on exertion. GASTROINTESTINAL: No nausea, vomiting, diarrhea, abdominal pain, melena, or ulcers. He says he is hungry. GENITOURINARY: No dysuria, hematuria. ENDOCRINE: No polyuria, polydipsia, thyroid problems. HEME/LYMPH: No anemia, easy bruising. SKIN: No rash or lesions. He does have a cut on his arm from his slash. MUSCULOSKELETAL: No pain in shoulders, knees, no limited activity. NEUROLOGICAL: No history of CVA, transient  ischemic attack, seizures. PSYCH: He has anxiety, depression.   PAST MEDICAL HISTORY:  1. Hypertension.  2. Diabetes.  3. Gastroesophageal reflux disease.  4. Depression, anxiety.  5. Asthma.   MEDICATIONS: The only medication patient is taking is Nexium 40 mg daily. He is supposed to be taking 70/30, but he does not take that. He is also supposed to be taking enalapril 20 mg daily and Norvasc 10 mg daily, which he doesn't take.   ALLERGIES: Codeine, Motrin, aspirin, penicillin and tramadol.   FAMILY HISTORY: Positive for diabetes.   PAST SURGICAL HISTORY: None.   SOCIAL HISTORY: Patient smokes 1/2 pack a day. He is not interested in quitting. He drinks 2 to 3 beers a day, sometimes a 12 pack. If he does not drink 2 or 3 beers he will have withdrawal with shaking symptoms.   PHYSICAL EXAMINATION:  VITAL SIGNS: Temperature 98.4, pulse 91, blood pressure 136/76, respirations 16, 100% on room air.   GENERAL: Patient is alert, oriented. He is very disheveled.   HEENT: Head is atraumatic. Pupils are round, reactive. Sclerae anicteric. Mucous membranes are moist. Oropharynx is clear. He has very poor dentition.   NECK: Supple without jugular venous distention or carotid bruit or enlarged thyroid.   CARDIOVASCULAR: Regular rate and rhythm. No murmurs, gallops, or rubs. PMI is not displaced.   LUNGS: Clear to auscultation bilaterally without crackles, rales, rhonchi, wheezing.   BACK: No CVA or vertebral tenderness.  ABDOMEN: Bowel sounds are positive. Nontender, nondistended. No hepatosplenomegaly.   EXTREMITIES: No clubbing, cyanosis, or edema.   NEUROLOGICAL: Cranial  nerves II through XII are intact. There are no focal deficits. No cerebellar abnormalities. Gait is not tested.  SKIN: He has no rashes or lesions.   LABORATORY, DIAGNOSTIC, AND RADIOLOGICAL DATA: Sodium 136, potassium 3.5, chloride 101, bicarbonate 25, BUN 2, creatinine 0.77, glucose 138; this is on a D10 drip.  Bilirubin 4.3, alkaline phosphatase 253, ALT 114, AST 325, total protein 7.3, albumin 3.2, calcium 8.5, white blood cells 4, hemoglobin 13, hematocrit 39.3, platelets 116. Alcohol level 197, Tylenol level less than 2. TSH 1.28. Urinalysis shows no leukocyte esterase or nitrites.    EKG: Normal sinus rhythm. No ST elevations or depressions.   ASSESSMENT AND PLAN: 39 year old male who was brought in via EMS after he called EMS. He says that he took about 500 units of 70/30 insulin is a suicidal attempt.  1. Insulin overdose. Patient claims that he took 500 units of 70/30, he had these shaking sort of seizures cells when his blood sugars were low. He is currently on a D10 drip which we will continue and will monitor his blood sugars q.1 hours.  2. Suicide attempt. Patient will have a sitter and psychiatric consult.  3. Hypertension. It doesn't appear that patient is taking his medications. Will monitor his blood pressure. He probably needs some blood pressure medication at discharge, probably an ACE inhibitor given his history of diabetes.  4. Gastroesophageal reflux disease. Will continue PPI.  5. Transaminitis secondary to alcohol, likely alcoholic hepatitis. Will continue to monitor and I will calculate his discriminate factor.  6. Alcohol dependence. Patient will be placed on CIWA protocol.  7. Tobacco dependence. Patient is adamant about not wanting to quit smoking. Patient will be placed with a nicotine patch. Time spent on counseling was about three minutes.  8. CODE STATUS: Patient is a FULL CODE status.   TIME SPENT: Approximately 45 minutes.   ____________________________ Janyth ContesSital P. Juliene PinaMody, MD spm:cms D: 06/12/2012 18:19:52 ET T: 06/13/2012 06:13:52 ET JOB#: [id number]  cc: Nixon Sparr P. Juliene PinaMody, MD, <Dictator>  Janyth ContesSITAL P Cleophas Yoak MD ELECTRONICALLY SIGNED 06/13/2012 20:13
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
## 2015-03-10 NOTE — Consult Note (Signed)
PATIENT NAME:  [name], [name] MR#:  [id number] DATE OF BIRTH:  09/30/1976  DATE OF CONSULTATION:  08/11/2012  REFERRING PHYSICIAN:   CONSULTING PHYSICIAN:  Deiondra Denley K. Ramon Zanders, MD  SUBJECTIVE: The patient is a 39 year old white male, not employed, draws disability for mental illness. The patient is single, never married, and lives by himself in a trailer. The patient comes back to [facility] Emergency Room for insulin overdose.    HISTORY OF PRESENT ILLNESS: According to information obtained from the chart, the patient overdosed on insulin and this is the third time. He takes too much insulin and then his blood sugar becomes very low and this time it was 16 mg/L and then he called EMS who brought him here for help.  He continues to have this kind of suicide attempts.  However, the patient denies history of suicide attempt and he said that he did not know how much insulin he is supposed to take when asked. It is doubtful if he also overdosed on other medications like Tegretol.    PAST PSYCHIATRIC HISTORY: The patient reports that he has had two previous inpatient hospitalizations to psychiatry. He denies any suicide attempt. He was admitted to [facility].  Admits that he does not keep up with any followup appointments with a psychiatrist because he has problems with transportation.   ALCOHOL AND DRUGS: He has an occasional drink of alcohol. He denies street or prescription drug abuse. He smokes cigarettes but one-half pack lasts about two days or more.   MENTAL STATUS EXAMINATION: The patient is dressed in hospital clothes, alert and oriented to place, person, and time and fully aware of the situation that brought him for admission to [facility].  He knew the capital of [place], he knew the president lives in [place], [place], and he knew the name of the current president.  He denies feeling depressed. He denies feeling hopeless or helpless. He absolutely denies any ideas or  plans to hurt himself or others and contracts for safety.  No evidence of psychosis. Denies auditory or visual hallucinations.  Denies any thought insertion or thought control. He denies having any grandiose ideas. He could count money and he stated that four quarters, 10 dimes, and 20 nickels make a dollar, but did not know the number of pennies. He could not spell the word world and he said he cannot focus at this time.  For fire he reported that he would try to jump out of the window. For a stamped and addressed envelope, he reported he would put it in the mailbox.  He absolutely denies any ideas or plans to hurt himself or others. He denies any interest in going to a structured place where he can get supervision with his insulin intake. Insight and judgment are guarded/probably impaired.   IMPRESSION: 1. Major depressive disorder, recurrent, with suicide attempts, probably. 2. Alcohol abuse. 3. Nicotine dependence.  RECOMMENDATIONS: Recommend psychological evaluation for level of functioning and expectations to be made so that further and future planning can be made to know his capacity in dealing and taking care of his health so that future planning can be made and the patient may be recommended a structured environment where he can get supervision with his medications.  ____________________________ Jannet MantisSurya K. Guss Bundehalla, MD skc:slb [name]: 08/11/2012 20:54:14 ET T: 08/12/2012 12:06:22 ET JOB#: [id number]  cc: Monika SalkSurya K. Guss Bundehalla, MD, <Dictator> Beau FannySURYA K Reola Buckles MD ELECTRONICALLY SIGNED 08/12/2012 22:52

## 2015-03-10 NOTE — Consult Note (Signed)
PATIENT NAME:  [name], [name] MR#:  [id number] DATE OF BIRTH:  02/18/1976  DATE OF CONSULTATION:  07/17/2012  REFERRING PHYSICIAN:  Maricela BoLuna Ragsdale, MD CONSULTING PHYSICIAN:  Jolanta B. Pucilowska, MD  REASON FOR CONSULTATION: To evaluate the patient after a suicide attempt.   IDENTIFYING DATA: [name] is a 39 year old male with history of depression and alcoholism.   CHIEF COMPLAINT: "It wasn't a suicide."  HISTORY OF PRESENT ILLNESS: [name] was discharged from [facility] at the beginning of August 2013 admitted after an overdose on insulin in a suicide attempt. This was in the context of break-up with a girlfriend who left him. He came to the hospital with cut wrist as well as overdosing on insulin. He did not follow-up with his outpatient appointment. He showed up in the Emergency Room on 07/17/2012 after an overdose on insulin. He initially took 50 units and then another 50. He now denies that this was a suicide attempt, explains that he did not have glucometer strips, felt his glucose was elevated, and took an additional dose of insulin. It seems unlikely as his hemoglobin A1c is low and if his diabetes was well controlled, which usually cannot be accomplished without a glucometer.  The patient complains of depressed mood since the girlfriend left him and thoughts of not wanting to live without her. In addition, his ex-girlfriend has been texting him and calling him and he finds it very upsetting. He denies heavy drinking lately and has not gotten drunk even once since discharge from the hospital. He denies excessive anxiety, symptoms of psychosis or symptoms suggestive of bipolar mania. He denies other than alcohol substance use, but his urine tox screen is positive for cannabinoids and benzodiazepines.   PAST PSYCHIATRIC HISTORY: He had several suicide attempts by overdose. There is a history of self-injurious behavior. He was hospitalized at [facility] in 1990 for  reportedly almost a year. This followed the divorce of his parents. He was also hospitalized [facility] in July of this year. He does not follow up in the community. He has been tried on multiple medications including Prozac, Tegretol, lithium, and Valium but is not taking any medications currently.  FAMILY PSYCHIATRIC HISTORY: None.   PAST MEDICAL HISTORY:  1. Hypertension.  2. Diabetes.  3. Seizures.  4. Gastroesophageal reflux disease. 5. Asthma.  6. Alcoholic hepatitis.  ADMISSION MEDICATIONS: 1. Trazodone 50 mg at night.  2. Zoloft 100 mg. 3. Propranolol 10 mg twice daily. 4. Pravastatin 10 mg daily.  5. Norvasc 10 mg daily.  6. Nexium 40 mg daily. 7. Metformin 500 mg twice daily.  8. Lisinopril 40 mg daily. 9. Hydroxyzine 25 mg four times daily. 10. Neurontin 300 mg daily. 11. Albuterol as needed.   ALLERGIES: Aspirin, codeine, Motrin, penicillin, and tramadol.  SOCIAL HISTORY: The patient is originally from [place]. He denies any history of childhood abuse. He is on disability from his seizures. He does not have any children. He lived with a girlfriend for nine years but she is gone now. There is a history of one DUI.   REVIEW OF SYSTEMS: CONSTITUTIONAL: No fevers or chills. No weight changes. EYES: No double or blurred vision. ENT: No hearing loss. RESPIRATORY: No shortness of breath or cough. CARDIOVASCULAR: No chest pain or orthopnea. GASTROINTESTINAL: No abdominal pain, nausea, vomiting, or diarrhea. GU: No incontinence or frequency. ENDOCRINE: No heat or cold intolerance. LYMPHATIC: No anemia or easy bruising. INTEGUMENTARY: No acne or rash. MUSCULOSKELETAL: No  muscle or joint pain. NEUROLOGIC: No tingling or weakness. PSYCHIATRIC: See history of present illness for details.   PHYSICAL EXAMINATION:   VITAL SIGNS: Blood pressure 152/94, pulse 78, respirations 18, and temperature 99.   GENERAL: This is a slightly obese young male in no acute  distress. The rest of the physical examination is deferred to his primary attending.   LABORATORY DATA: Chemistries are within normal limits with blood glucose of 42 initially. Blood alcohol level is 0.142. LFTs within normal limits except for AST of 70. TSH is 2.8. Urine tox screen is positive for benzodiazepines and cannabinoids. CBC is within normal limits. Serum acetaminophen and salicylates are low.   MENTAL STATUS EXAMINATION: The patient is alert and oriented to person, place, time, and situation. He is pleasant, polite, and cooperative. He is in bed wearing hospital scrubs and a yellow shirt. He maintains good eye contact. His speech is soft. Mood is depressed with flat affect. Thought processing is logical and goal oriented. Thought content - he denies suicidal or homicidal ideation. There are no delusions or paranoia. There are no auditory or visual hallucinations. His cognition is grossly intact. His insight and judgment are questionable.   SUICIDE RISK ASSESSMENT: This is a patient with a long history of depression, mood instability and substance abuse who apparently relapsed on alcohol and overdosed on insulin   DIAGNOSES:   AXIS I:  1. Bipolar affective disorder, most recent episode depressed. 2. Alcohol dependence.  3. Benzodiazepine abuse. 4. Nicotine dependence.   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   AXIS III: Hypertension, diabetes, grand mal seizures, GERD, asthma, and alcoholic hepatitis.   AXIS IV: Mental and physical illness, break-up with a girlfriend, primary support, noncompliance with treatment, and history of legal problems.   AXIS V: GAF 45.   PLAN:  1. The patient is in need of psychiatric hospitalization. As no beds are available here we have referred him to another facility. He was accepted at [facility].  2. I restarted all his medications as per discharge instructions. 3. I will follow up if  necessary. ____________________________ Ellin Goodie Jennet Maduro, MD jbp:slb D: 07/20/2012 14:25:52 ET T: 07/20/2012 14:43:42 ET JOB#: [id number]  cc: Jolanta B. Jennet Maduro, MD, <Dictator> Shari Prows MD ELECTRONICALLY SIGNED 07/21/2012 19:09

## 2015-03-10 NOTE — Consult Note (Signed)
Brief Consult Note: Diagnosis: Bipolar affectiver disorder, alcohol abuse.   Patient was seen by consultant.   Consult note dictated.   Recommend further assessment or treatment.   Orders entered.   Discussed with Attending MD.   Comments: [name] has a h/o mood instability. He has not been compliant with treatment following most recent discharge [facility]. He overdosed on insulin.  PLAN: 1. The patient needs psychiatric hospitalization. As we have no beds, we referred him to other facilities. He will, most likely be transferred to [facility] in am.   2. I will restart all his meds as per dischrge instructions.   3. I will follow up if need is.  Electronic Signatures: Kristine LineaPucilowska, Gottfried Standish (MD)  (Signed 27-Aug-13 18:26)  Authored: Brief Consult Note   Last Updated: 27-Aug-13 18:26 by Kristine LineaPucilowska, Leston Schueller (MD)

## 2015-03-10 NOTE — Consult Note (Signed)
Brief Consult Note: Diagnosis: Bipolar affectiver disorder, alcohol abuse.   Patient was seen by consultant.   Consult note dictated.   Recommend further assessment or treatment.   Discussed with Attending MD.   Comments: [name] has a h/o mood instability. He has not been compliant with treatment in the community and [facility]. He overdosed on insulin again while drunk.  PLAN: 1. The patient needs psychiatric hospitalization. He is on IVC. Will transfer to psychiatry.    2. Please contact [name] at [phone] to coordinate transfer..  Electronic Signatures: [name], [name] (MD)  (Signed 23-Sep-13 14:57)  Authored: Brief Consult Note   Last Updated: 23-Sep-13 14:57 by [name], [name] (MD)

## 2015-03-10 NOTE — Discharge Summary (Signed)
PATIENT NAME:  [name], [name] MR#:  [id number] DATE OF BIRTH:  17-Feb-1976  DATE OF ADMISSION:  07/10/2012 DATE OF DISCHARGE:  07/11/2012  CONSULTANT: Greer EeWilliam Ryan, MD - Psychiatry.   PRIMARY CARE PHYSICIAN: Dione HousekeeperMario Ernesto Olmedo, MD  CHIEF COMPLAINT: Insulin overdose.   DISCHARGE DIAGNOSES:  1. Hypoglycemia from insulin overdose, possibly unintentional.  2. Hypokalemia.  3. Bipolar disorder.  4. Alcohol dependence.  5. History of hypertension.  6. Asthma.  7. History of tobacco abuse.  8. History of gastroesophageal reflux disease.  DISCHARGE MEDICATIONS:  1. Norvasc 10 mg daily.  2. Propranolol 10 mg two times a day.  3. Hydroxyzine 25 mg four times daily. 4. Lisinopril 40 mg daily.  5. Sertraline 100 mg daily.  6. Trazodone 50 mg at bedtime. 7. Gabapentin 300 mg one cap three times daily. 8. Multivitamin orally 1 tab daily.  9. Pravastatin 10 mg at bedtime. 10. Nexium 40 mg daily.  11. Naltrexone 50 mg daily.  12. Metformin 500 mg 1 tab twice a day. 13. Albuterol CFC free 90 mcg inhaled 2 puffs every six hours as needed.  14. Stop taking insulin.   DIET: Low sodium, ADA diet.   ACTIVITY: As tolerated.   DISCHARGE FOLLOWUP: Please follow-up with your PCP within 1 to 2 weeks and stop alcohol consumption as discussed.  DISPOSITION: Home.   CODE STATUS: FULL CODE.   HISTORY OF PRESENT ILLNESS/HOSPITAL COURSE: For full details of the history and physical, please see the dictation on 07/10/2012 by Dr. Rudene Rearwish, but briefly this is a 39 year old gentleman with history of diabetes and history of suicide attempt in the past with insulin who states he was experiencing blurry vision and unintentionally took 60 units of insulin as he was only supposed to take 6. There was no suicidal intent. He did not  want to hurt himself. In the ER, he was noted to have blood sugars in the 40s and admitted for observation to the hospitalist service. His insulin standing dose was held as well as  his metformin and his blood sugars did trend up. His hemoglobin A1c from a previous hospitalization is only 4.4, as of 06/14/2012, and with that type of sugars, especially in the setting of potential overdose issues, he should not be on any type of insulin. He will be discharged with metformin. He was seen by psychiatry and recommendation was continuation of outpatient psych medications without the need for hospitalization. Currently he denies any suicidal ideation or intention. He states he does not want to hurt himself. Of note, the patient did have a stay in [facility] in early July or early August and was transferred to an inpatient rehab center. However, he does drink alcohol again even after the recent detoxes. He does not have any intention of quitting alcohol it appears. He did have hypokalemia as well and he was started on IV fluids with potassium supplements. His blood pressure has been stable. At this point, he will be discharged with outpatient follow-up with Dr. Zada Finderslmedo, his primary care physician.            DISPOSITION: Home.  TOTAL TIME SPENT: 35 minutes. ____________________________ Krystal EatonShayiq Gayna Braddy, MD sa:slb [name]: 07/11/2012 14:25:00 ET T: 07/11/2012 16:31:46 ET JOB#: [id number]  cc: Dione HousekeeperMario Ernesto Olmedo, MD Krystal EatonShayiq Marixa Mellott, MD, <Dictator> Krystal EatonSHAYIQ Raelene Trew MD ELECTRONICALLY SIGNED 07/15/2012 16:17

## 2015-03-10 NOTE — H&P (Signed)
PATIENT NAME:  [name], [name] MR#:  [id number] DATE OF BIRTH:  [birth year]  DATE OF ADMISSION:  07/10/2012  PRIMARY CARE PHYSICIAN: Dr. Rolin BarryMario Olmedo    CHIEF COMPLAINT: Accidental drug overdose with insulin.   HISTORY OF PRESENT ILLNESS: [name] is a 39 year old Caucasian male with history of bipolar disorder. He was admitted here on July 24th and discharged this month on August 5th when he had overdose with insulin in suicide attempt when he broke up with his girlfriend. This time the patient states that his eyes were blurry and he didn't see the insulin syringe well and instead of taking 6 units of insulin 70/30 he took 60 by mistake. He denies any suicidal thoughts or suicidal attempt. Subsequently he developed some shaking and headache feeling. His blood sugar at the Emergency Department showed levels of 45 and 43 and 101. The patient was admitted for observation and also to involve Psychiatry for further input even though he tells me that it was an accident.   REVIEW OF SYSTEMS: CONSTITUTIONAL: Denies any fever. No chills. He has some shaking feeling. No fatigue. EYES: No acute change in his vision. No double vision. ENT: No hearing impairment. No sore throat. No dysphagia. CARDIOVASCULAR: No chest pain. No shortness of breath. No edema. RESPIRATORY: No shortness of breath. No cough. No sputum production. No chest pain. GASTROINTESTINAL: No abdominal pain. No vomiting. No diarrhea. GENITOURINARY: No dysuria. No frequency of urination. MUSCULOSKELETAL: No joint pain or swelling. No muscular pain or swelling. INTEGUMENTARY: No skin rash. No ulcers. NEUROLOGY: No focal weakness. No seizure activity but he admits having headache with the low blood sugar. PSYCHIATRY: He has bipolar manic depressive illness. Anxiety. ENDOCRINE: No polyuria or polydipsia. No heat or cold intolerance.   PAST MEDICAL HISTORY:  1. History of bipolar disorder. 2. Alcohol abuse.  3. Systemic hypertension.  4. Diabetes  mellitus, type II, on insulin.  5. Asthma.  6. Tobacco abuse. 7. Gastroesophageal reflux disease.   FAMILY HISTORY: Positive for diabetes mellitus.   PAST SURGICAL HISTORY: Negative.   SOCIAL HABITS: Chronic smoker of half a pack a day for the last 15 years. History of alcoholism. He minimized that and he tells me that he is not drinking but when I asked him the last drink he said today he had two beers only, whereas his ethanol level in blood is 0.125.   ADMISSION MEDICATIONS:  1. Sertraline 100 mg once a day.  2. Propranolol 10 mg twice a day.  3. ProAir HFA 2 puffs q.4 hours p.r.n.  4. Pravastatin 10 mg a day. 5. Norvasc 10 mg a day.  6. Nexium 40 mg a day. 7. Naltrexone 50 mg once a day.  8. Divalproex 30 mg once a day.  9. Multivitamin once a day. 10. Metformin 500 mg taking 2 tablets, that is 1000 mg once a day and 500 mg twice a day.  11. Lisinopril 40 mg a day  12. Insulin NovoLog 70/30. He takes 6 to 8 units in the morning and 6 to 8 units in the evening.  13. Gabapentin 300 mg 3 times a day. 14. Hydroxyzine taking 25 mg 4 times a day for his anxiety.   ALLERGIES: Codeine causes nausea and vomiting. Motrin causes GI distress. Aspirin and penicillin cause nausea, vomiting, and diarrhea, tramadol as well.   PHYSICAL EXAMINATION:   VITAL SIGNS: Blood pressure 127/73, respiratory rate 18, pulse 93, temperature 97.3, oxygen saturation 94%.   GENERAL APPEARANCE: This is a young male  sitting at the bedside in no acute distress.   HEAD: No pallor. No icterus. No cyanosis.   EARS, NOSE, AND THROAT: Hearing was normal. Nasal mucosa, lips, tongue were normal.   EYES: Normal iris and conjunctivae. Pupils about 6 mm, equal and reactive to light.   NECK: Supple. Trachea at midline. No thyromegaly. No cervical lymphadenopathy. No masses.   HEART: Normal S1, S2. No S3, S4. No murmur. No gallop. No carotid bruits.   RESPIRATORY: Normal breathing pattern without use of accessory  muscles. No rales. No wheezing.   ABDOMEN: Soft without tenderness. No hepatosplenomegaly. No masses. No hernias.   SKIN: No ulcers. No subcutaneous nodules.   MUSCULOSKELETAL: No joint swelling. No clubbing.   NEUROLOGIC: Cranial nerves II through XII are intact. No focal motor deficit.   PSYCHIATRIC: The patient is alert and oriented x3. Mood and affect were normal.   LABORATORY, DIAGNOSTIC, AND RADIOLOGICAL DATA: His blood sugar readings were 45, 43, 101, and 99. BUN 2, creatinine 0.7, sodium 138, potassium 3. Ethanol level was 0.125. Normal liver function tests except for elevated AST at 73. Urine drug screen was positive for opiates. CBC showed white count of 6000, hemoglobin 12, hematocrit 35, platelet count 211. Urinalysis was unremarkable.   ASSESSMENT: 1. Drug overdose with insulin 7/30 taking 60 units instead of 6 units. The patient confirms that it was accidental because he had blurry vision. He is also intoxicated with alcohol. Denies any suicidal thoughts or attempts.  2. Hypoglycemia secondary to above.  3. Hypokalemia.  4. Recent admission with suicidal drug overdose using insulin.  5. Systemic hypertension.  6. Diabetes mellitus type 2, on insulin. 7. Bipolar disorder.  8. Asthma.  9. Tobacco abuse.  10. Chronic alcoholism.   PLAN: 1. Will admit the patient for observation.  2. Place him on IV D5W at slow rate.  3. Follow-up on Accu-Cheks.  4. Hold metformin.  5. Continue the rest of the home medications.  6. Obtain psychiatric consult.  7. Potassium replacement for his hypokalemia.   TIME SPENT EVALUATING THIS PATIENT: More than 50 minutes including reviewing his recent medical records and admissions and psychiatric evaluations.    ____________________________ Carney Corners. Rudene Re, MD amd:drc [name]: 07/10/2012 01:22:11 ET T: 07/10/2012 07:48:38 ET JOB#: [id number]  cc: Carney Corners. Rudene Re, MD, <Dictator> Dione Housekeeper, MD Karolee Ohs Dala Dock MD ELECTRONICALLY SIGNED  07/10/2012 23:34

## 2015-03-13 NOTE — H&P (Signed)
PATIENT NAME:  [name], [name] MR#:  [id number] DATE OF BIRTH:  [birth year]  DATE OF ADMISSION:  01/18/2013  PRIMARY CARE PHYSICIAN:  [facility] in [place].   CHIEF COMPLAINT:  Drug overdose with insulin due to depression.   HISTORY OF PRESENT ILLNESS:  The patient is a 39 year old Caucasian male with frequent admissions to this hospital with insulin overdose.  The patient was just discharged from the hospital within the last 48 hours and again he took overdose of insulin, stating he took 280 units of Lantus and then 5 vials of NovoLog insulin, each has 260 units.  His father called EMS and patient was transported back to the hospital and admitted with the same diagnosis as before.  The patient is fully awake and long-acting normal and feels fine, but asking for Ativan to calm down his nerves.   REVIEW OF SYSTEMS:  CONSTITUTIONAL:  Denies any fever.  No chills.  No fatigue.  EYES:  No blurring of vision.  No double vision.  EARS, NOSE, THROAT:  No hearing impairment.  No sore throat.  No dysphagia.  CARDIOVASCULAR:  No chest pain.  No shortness of breath.  No syncope.  RESPIRATORY:  No cough.  No sputum production.  No chest pain.  GASTROINTESTINAL:  No abdominal pain, no vomiting, no diarrhea.  GENITOURINARY:  No dysuria.  No frequency of urination.  MUSCULOSKELETAL:  No joint pain or swelling.  No muscular pain or swelling.  INTEGUMENTARY:  No skin rash.  No ulcers.  NEUROLOGY:  No focal weakness.  No seizure activity.  No headache.  PSYCHIATRY:  He has depression and some anxiety.  ENDOCRINE:  No polyuria or dyspnea.  No heat or cold intolerance.  HEMATOLOGY:  No easy bruisability.  No lymph node enlargement.   PAST MEDICAL HISTORY:  Again, recurrent admissions to the hospital with suicidal attempts using overdose with insulin usually.  The patient has history of diabetes mellitus type 1 on insulin, systemic hypertension, alcohol and tobacco abuse.   FAMILY HISTORY:  His father has  diabetes and there is also family history of depression.   SOCIAL HABITS:  Chronic smoker, 1 pack per day since the age of 39.  He drinks beer about 12 of them daily.  No other drug abuse.   SOCIAL HISTORY:  The patient lives at home in a trailer with his father.  He broke up with his girlfriend a while ago, but he keeps bringing this issue as a cause for his depression.   ADMISSION MEDICATIONS:  Seroquel 50 mg 3 tablets at night, Prilosec 20 mg a day, hydroxyzine 2 tablets 3 times a day, each is 50 mg.  Gabapentin 300 mg 3 times a day, fluoxetine 40 mg once a day, enalapril 20 mg a day, amlodipine 10 mg a day.   ALLERGIES:  CODEINE CAUSING NAUSEA AND VOMITING.  MOTRIN CAUSING GI SIDE EFFECTS.  PENICILLIN, TRAMADOL AND ASPIRIN.  ASPIRIN CAUSES NAUSEA AND VOMITING AS WELL.   PHYSICAL EXAMINATION: VITAL SIGNS:  Blood pressure 130/60, respiratory rate 20, pulse 88, temperature 97.8, pulse oximetry 97%.  GENERAL APPEARANCE:  Young male sitting at the stretcher in no acute distress eating some food and drinking milk.  HEAD AND NECK:  No pallor.  No icterus.  No cyanosis.  EARS, NOSE, THROAT:  Ear examination revealed normal hearing, no discharge, no ulcers.  Nasal mucosa was normal without ulcers, no discharge, no bleeding.  Oropharyngeal area was normal without ulcers, no oral thrush.  He is edentulous,  has no teeth and no dentures.  EYES:  Revealed normal eyelids and conjunctivae.  Pupils about 4 to 5 mm, round, equal and reactive to light.  NECK:  Supple.  Trachea at midline.  No thyromegaly.  No cervical lymphadenopathy.  No masses.  HEART:  Normal S1, S2.  No S3, S4.  No murmur.  No gallop.  RESPIRATORY:  Revealed normal breathing pattern without use of accessory muscles.  No rales.  No wheezing.  ABDOMEN:  Soft without tenderness.  No hepatosplenomegaly.  No masses.  No hernias.  SKIN:  Revealed no ulcers.  No subcutaneous nodules.  MUSCULOSKELETAL:  No joint swelling.  No clubbing.   NEUROLOGIC:  Cranial nerves II through XI were intact.  No focal motor deficit.  PSYCHIATRIC:  The patient is alert, oriented x 3.  Mood and affect appears normal and he is calm.  Denies active suicidal attempt now.  He feels he is fine, but states that he is still depressed and he wants the appropriate medicine to make him less depressed.   LABORATORY FINDINGS:  EKG showed normal sinus rhythm at rate of 82 per minute.  Unremarkable EKG.  His blood sugar was 60.  Follow-up was 59, then 209, then 182, then 99.  BUN 5, creatinine 0.5, sodium 139, potassium 4.7.  Ethanol 0.1.  Normal liver function tests except for elevated AST at 61.  TSH 3.6.  CBC showed white count of 8000, hemoglobin 13, hematocrit 40, platelet count 259.  Urinalysis was unremarkable.  Acetaminophen less than 2.  Salicylate less than 1.7.   ASSESSMENT: 1.  Drug overdose with insulin secondary to depression.   2.  Suicidal attempt.  3.  Insulin-dependent diabetes mellitus.  4.  Systemic hypertension.  5.  Tobacco abuse.  6.  Alcohol abuse.   PLAN:  We will admit the patient to the intensive care unit.  The patient was committed for psychiatry.  He has one-on-one sitter for observation.  I will put him on a regular diet right now until his blood sugar is stabilized.  IV dextrose D50 dextrose 5%.  Monitor blood sugar closely.  Continue home medications and consult psychiatry.   TIME SPENT IN EVALUATING THIS PATIENT:  Took more than 30 minutes.     ____________________________ Carney Corners. Rudene Re, MD amd:ea [name]: 01/18/2013 00:43:21 ET T: 01/18/2013 03:22:56 ET JOB#: [id number]  cc: Carney Corners. Rudene Re, MD, <Dictator> Zollie Scale MD ELECTRONICALLY SIGNED 01/18/2013 6:48

## 2015-03-13 NOTE — H&P (Signed)
PATIENT NAME:  [name], [name] MR#:  [id number] DATE OF BIRTH:  [birth year]  DATE OF ADMISSION:  11/27/2012  PRIMARY CARE PHYSICIAN:  Rolin Barry, MD  REFERRING PHYSICIAN:  Dr. Manson Passey.  CHIEF COMPLAINT:  Intentional insulin overdose.   HISTORY OF PRESENT ILLNESS: The patient is a 39 year old Caucasian male with a past medical history of diabetes mellitus, hypertension, bipolar disorder, depression and GERD who was brought to the ER via EMS after he tried to kill himself by taking 600 to 700 units of NovoLog subcutaneously. The patient is reporting that he is very, very depressed and tried to kill himself. He has injected 600 to 700 units of NovoLog subcutaneously into his abdomen and then eventually he started having shakes and called his friend and told him that he tried to kill himself.  His friend called EMS and he was brought into the ER. Initial Accu-Chek was 22 at around 2100. The patient was started on D10 drip and subsequently blood sugar went up to 128 at 2130 minutes.  During my examination at 12:40 a.m. the Accu-Chek was 108.  The patient is reporting that he lives alone and feeling very, very depressed. Apparently the patient's insulin was discontinued during the previous admission in September 2013. This time the patient took his father's insulin who is also diabetic. The patient was just recently admitted to the hospital in September 2013 with a similar complaint of insulin overdose. He was evaluated by behavioral therapy at that time. During my examination the patient is again complaining of shakes and Accu-Chek was 108.  We have provided another tray of food.  The patient denies any chest pain, shortness of breath, abdominal pain, nausea, vomiting, diarrhea.  He denies any other complaints but weakness and fatigue. No family members are at bedside.   PAST MEDICAL HISTORY:  Insulin-dependent diabetes mellitus, hypertension, bipolar disorder, GERD, family history of depression, tobacco abuse,  asthma.   PAST SURGICAL HISTORY: None.   ALLERGIES: 1.  CODEINE. 2.  MOTRIN. 3.  ASPIRIN. 4.  PENICILLIN. 5.  TRAMADOL.  HOME MEDICATIONS: Norvasc 10 mg once daily, Nexium 40 mg once daily, Humulin 70/30 units subcutaneously 2 times a day, enalapril 20 mg once a day.  PSYCHOSOCIAL HISTORY: Lives at home, lives alone and smokes 1 pack a day. Drinks a 12 pack of beer every day. Denies any history of drugs.   FAMILY HISTORY: Father has a history of diabetes mellitus.  REVIEW OF SYSTEMS:  CONSTITUTIONAL: Complaining of fatigue, weakness. Denies any pain, weight loss, weight gain.  EYES: Denies any blurry vision, cataracts, inflammation, glaucoma.  ENT: Denies tinnitus, ear pain, discharge, snoring, postnasal drip, sinus pain.  RESPIRATORY: Denies cough, wheezing or painful respirations, pneumonia.  CARDIOVASCULAR: Denies chest pain, orthopnea, palpitations, syncope or varicose veins.  GASTROINTESTINAL: Denies nausea, vomiting, diarrhea, abdominal pain.  The patient has a history of GERD.  Denies any change in bowel habits.  GENITOURINARY: Denies dysuria, hematuria or renal calculi.  ENDOCRINE: Denies polyuria, polyphagia, polydipsia, nocturia, thyroid problems, increased sweating.  HEMATOLOGIC/LYMPHATIC:  Denies anemia, easy bruising.  INTEGUMENTARY: Denies acne, rash, lesions.  MUSCULOSKELETAL: Denies any pain in the neck, back, shoulder. Denies any limited activity.  NEUROLOGIC: Denies numbness, weakness. Complaining of tremors. Denies any vertigo, ataxia, dementia.  PSYCHIATRIC:  Very depressed, wants to kill himself. Denies insomnia, ADD, OCD.   PHYSICAL EXAMINATION: VITAL SIGNS: Temperature 97.6, pulse 90, respirations 18, blood pressure 138/72, satting  96% to 97% on room air.  GENERAL APPEARANCE: Not in acute distress,  moderately built and moderately nourished.  HEENT: Normocephalic, atraumatic. Pupils are equally reacting to light and accommodation. No conjunctival injection.  No nasal discharge, sinus tenderness. No pharyngeal edema. No postnasal drip.  Tympanic membranes are intact.   NECK: Supple. No JVD. No thyromegaly.  LUNGS: Clear to auscultation bilaterally. No accessory muscle usage. No anterior chest wall tenderness.  CARDIAC: S1, S2 normal. Regular rate and rhythm. Point of maximum impulse is intact.  GASTROINTESTINAL: Soft. Bowel sounds are positive in all 4 quadrants. Nontender, nondistended. No masses felt.  NEUROLOGIC: Awake and alert and oriented x 3. Motor and sensory are grossly intact. Reflexes are 2+.  SKIN: Warm to touch. Normal turgor. No acne, rashes, lesions.  MUSCULOSKELETAL: No joint effusion, tenderness or erythema. EXTREMITIES: No edema, no cyanosis, no clubbing. PSYCHIATRIC: Flat mood and flat affect.   LABORATORY AND DIAGNOSTIC DATA:  Accu-Cheks:  Initial Accu-Chek was 22 at 2100.  At 2130 it was 128, at 2200 was 136, at 2300 it was 164, at 12:40 a.m. it was 108.  The  patient's  BUN 2, creatinine 0.75, sodium 139, potassium 3.1, chloride 106, CO2 19, GFR greater than 60, anion gap 14, calcium 8.0. Ethanol 159, percent is 0.159. SGOT 49. Troponin T less than 0.02.  Thyroid stimulating hormone 2.09. Digitalis 10.5. Urine drug screen is positive for benzos and cocaine. WBC 3.8, hemoglobin 14.3, hematocrit 42.2, platelet count 138,000.  Urinalysis:  Bili negative, ketones negative, specific gravity 1.005, nitrite and leukocyte esterase are negative.  Tylenol level 39, salicylates 25.8.   ASSESSMENT AND PLAN: 1.  Suicidal attempt with insulin overdose leading to hypoglycemia.  Will admit the patient to Critical Care Unit. Get Accu-Cheks every 30 to 60 minutes.  Will continue patient on dextrose 10 drip and up titrate it as needed basis. Will give him glucagon 1 mg subcutaneous as needed basis for hypoglycemia, blood sugar less than 50.  2.  Major depression with suicidal attempt. The patient will be on behavioral health and IVC  precautions.   Consult psychiatry. One-on-one observation.  3.  Diabetes mellitus. The patient is currently hypoglycemic.  His insulin was discontinued during previous admission as his hemoglobin A1c was 4.6.  4.  Hypertension. Blood pressure is stable.  Resume home medications. 5.  Gastroesophageal reflux disease.  Will continue proton pump inhibitor.  6.  Major depressive disorder with suicidal attempt.  Consult psychiatry.  7.  CODE STATUS:  The patient is full code. 8.  We will provide him deep vein thrombosis prophylaxis as well. 9.  Nicotine dependence.  The patient was counseled to quit smoking. We will provide him nicotine patch.  10.  Alcohol abuse. The patient is placed on CIWA protocol.  The plan of care was discussed in detail with the patient.   Total critical care time spent: 50 minutes.    ____________________________ Ramonita LabAruna Maki Sweetser, MD ag:ct D: 11/27/2012 01:20:05 ET T: 11/27/2012 09:23:27 ET JOB#: [id number]  cc: Ramonita LabAruna Clessie Karras, MD, <Dictator> Dione HousekeeperMario Ernesto Olmedo, MD Ramonita LabARUNA Aalani Aikens MD ELECTRONICALLY SIGNED 12/06/2012 7:17

## 2015-03-13 NOTE — Consult Note (Signed)
Brief Consult Note: Diagnosis: Mood disorder NOS, Borderline personality disorder, Alcohol dependence.   Patient was seen by consultant.   Consult note dictated.   Recommend further assessment or treatment.   Orders entered.   Discussed with Attending MD.   Comments: [name] returns to the hospital hypohlycemic just few hours following discharge from medical floor after another insulin overdose. he feels better today and is asking about discharge. He is unable to identify and risk factors to be avoided or protective factors to be strenghtened. He is strangly indifferent. He is unable to explain how he comes into posession of vast amounts of insulin. He is followed by SW.  PLAN: 1. The patient is on IVC. Please continue sitter.   2. The patient does not benefit from psychiatric hospitalization.   3. Will add Seroquel at night for insomnia. The patient has been always noncompliant with medications.   4. Case discussed with the attending and Frederico Hammanara Collier, CSW.  Electronic Signatures: Kristine LineaPucilowska, Lylia Karn (MD)  (Signed 28-Oct-14 19:38)  Authored: Brief Consult Note   Last Updated: 28-Oct-14 19:38 by Kristine LineaPucilowska, Jeraldean Wechter (MD)

## 2015-03-13 NOTE — Consult Note (Signed)
Brief Consult Note: Diagnosis: Mood disorder NOS, Borderline personality disorder, Alcohol dependence.   Patient was seen by consultant.   Consult note dictated.   Recommend further assessment or treatment.   Orders entered.   Discussed with Attending MD.   Comments: [name] has a long h/o mental illness, substance abuse and multiple suicide attempts. He [facility] after yet anothet overdose. He is no longer suicidal or homicidal. He is forward thinking and optimistic about the future.   PLAN: 1. The patient no longer meets criteria for IVC. I will terminate proceedings. Please discharge as appropriate. D/C sitter.   2. The patient is to continue Seroquel 200 mg at night only for mood stabilization. Rx was given at his last discharge. Please do not provide another script.  3. I will d/c detox protocol and high dose seroquel.   4. [facility] ACT team with Dr. Omelia BlackwaterHeaden will meet with the patient tomorrow at his house as planned.   5.  Case discussed with the attending and Frederico Hammanara Collier, CSW.  Electronic Signatures: Kristine LineaPucilowska, Valena Ivanov (MD)  (Signed [phone]-Nov-14 12:34)  Authored: Brief Consult Note   Last Updated: 03-Nov-14 12:34 by Kristine LineaPucilowska, Lama Narayanan (MD)

## 2015-03-13 NOTE — Consult Note (Signed)
Brief Consult Note: Diagnosis: Mood disorder NOS, alcohol dependence.   Patient was seen by consultant.   Consult note dictated.   Recommend further assessment or treatment.   Discussed with Attending MD.   Comments: Mr. Deziel is well known to [place]. He was hospitalized at [facility] 12/4-12/8 after an overdose on insulin. He was discharged from medicine. While the patient was awaiting his taxi, we learned that Dr. Rosine Door, his primary psychiatrist petitioned Mr. Skeels for chronic unsafe behavior.   I met Mr. Bozzo in the ER. He was surprised to see the petition. He denies feeling suicidal or homicidal and wants to be discharged to home. Mr. Neidig has been indeed chronically depressed. He frequently comes to the hospital after insulin overdose while drunk. Extended psychiatric hospitalizatrions, psychotropic medications, residential substance abuse treatment, or intensive outpatient care by ACT team did little to change his pattern. Short of obtaining guardianship, we are pessimistic that we will ever be able to modify his behavior.   PLAN: 1. The patient does not meet criteria for IVC. I will terminate proceedings. Please discharge as appropriate.  2. He is to continue medications as prescribed at the dime of discharge from medicine.  Electronic Signatures: Orson Slick (MD)  (Signed 08-Dec-14 19:36)  Authored: Brief Consult Note   Last Updated: 08-Dec-14 19:36 by Orson Slick (MD)

## 2015-03-13 NOTE — Consult Note (Signed)
Brief Consult Note: Diagnosis: Borderline personality disorder, Mood disorder NOS, Alcohol dependence.   Patient was seen by consultant.   Consult note dictated.   Recommend further assessment or treatment.   Comments: [name] is well known to us. He was hospitalized in psychiatry multiple times. Following last hospitalization he weas discharged to [facility] alcohol rehab facility. He left there on Monday 2/17. He started drinking immediately and resolve to OD on Insulin Lantus belonging to his father and Tegretol. He was admitted to CCU. He is on IVC and has a Comptrollersitter as there is a h/o overdose while in the hospital.  PLAN: 1. Please continue IVC and sitter.  2. No psychotropic medications indicated as the patient is sedated. He never takes any medications at home or follows up with his providers.   3. The patient showed over again that he does not benefit from psychiatric or substance abuse hospitalizations. We will attempt to discharge to home from medical floor.  4. I will follow up. I am not at the hospital this weekend. Please page me if problems.  Electronic Signatures: Kristine LineaPucilowska, Jolanta (MD)  (Signed 21-Feb-14 16:41)  Authored: Brief Consult Note   Last Updated: 21-Feb-14 16:41 by Kristine LineaPucilowska, Jolanta (MD)

## 2015-03-13 NOTE — H&P (Signed)
PATIENT NAME:  [name], CRUMPLER MR#:  [id number] DATE OF BIRTH:  Sep 28, 1976  DATE OF ADMISSION:  09/22/2013  CHIEF COMPLAINT: Intentional drug overdose.   HISTORY OF PRESENT ILLNESS: The patient is a 39 year old male with past medical history of depression and multiple episodes of drug overdose and admissions in the hospital for suicidal attempts, intentional overdose. He was discharged the day before yesterday and said that he had some argument with his girlfriend and he took overdose of Lantus today early morning and also had some Nyquil tablets with alcohol, after that he regret his action and called EMS. In the ER, his blood sugar level was found to be stable, but because of his acetaminophen overdose and insulin overdose they called the [facility] and they suggested to monitor him in hospital for at least 24 hours.   REVIEW OF SYSTEMS:  CONSTITUTIONAL: Negative for fever, fatigue, weakness, pain or weight loss.  EYES: No blurring, double vision, glaucoma or cataracts.  EARS, NOSE, THROAT:  No ear pain, no hearing loss. No discharge or ringing in the ears.  RESPIRATORY: No cough, wheezing, hemoptysis,  or COPD. CARDIOVASCULAR: No chest pain, orthopnea, edema, palpitations.  GASTROINTESTINAL: No nausea, vomiting, diarrhea or abdominal pain.  GENITOURINARY: No dysuria, hematuria or increased frequency.  ENDOCRINE: No poly-Hydrea, polyuria. HEMATOLOGY: No anemia or easy bruising.  SKIN: No rashes or lesions.  MUSCULOSKELETAL: Not any limitation on activities.  NEUROLOGICAL: No history of CVA, TIA, or any new weakness or tremors.  PSYCHIATRIC: Positive for depression and multiple suicidal ideation.   PAST MEDICAL HISTORY:  1. Multiple hospitalizations for suicidal ideations and hypoglycemia secondary to his insulin overdose.  2. Gastroesophageal reflux disease.  3. Hypertension.  4. Alcohol and tobacco dependence.  5. Depression and anxiety.   PAST SURGICAL HISTORY: None.    ALLERGIES: No known drug allergy.   SOCIAL HISTORY: Smokes about a pack a day. Drinks beer and a day. No IV drug use.   FAMILY HISTORY: Diabetes and MI in the family.   HOME MEDICATIONS: 1. Quetiapine 200 mg oral tablet once a day.  2. Nexium 40 mg once a day.  3. Clonazepam 1 mg oral 3 times a day.  4. Amlodipine 10 mg once a day.   PHYSICAL EXAMINATION: VITAL SIGNS: In the ER, temperature 98.9, pulse 83, respirations 12, blood pressure 142/90, pulse oximetry 96 at rest on room air.  GENERAL: The patient is alert and oriented and calm right now. He is cooperative with history taking and physical examination.  HEENT: Head and neck atraumatic. Conjunctivae pink. Oral mucosa moist.  NECK: Supple. No JVD.  RESPIRATORY: Bilateral clear and equal air entry.  CARDIOVASCULAR: S1, S2 present, regular. No murmur.  ABDOMEN: Soft, and nontender. Bowel sounds present. No organomegaly.  SKIN: No rashes.  LEGS: No edema.  NEUROLOGICAL: Power 5/5 all four limbs  PSYCHIATRIC: Appears to be depressed.  Blood sugar level is monitored in ER 91, which went to 83 and 160, and came down to 110 now. BUN 5, creatine 0.87, sodium 135, potassium 3.7, chloride 107, CO2 20, ethanol level is 0.174. Total protein 7.8, albumin 4.0, bilirubin 0.2. Troponin less than 0.02. TSH 1.68. Urine for toxicology is negative. WBC count 7.8, hemoglobin 14.6, platelet count 452. Urinalysis is negative. Acetaminophen level was 28 on presentation, which came down to 16 at 8:50 a.m. Salicylate level less than 1.7. Chest x-ray, portable, minimal lung base atelectasis.   ASSESSMENT AND PLAN: A 39 year old male with depression  and repeated  episode of suicidal attempts with insulin overdose, was discharged day before yesterday and again had insulin overdose last night because of argument with girlfriend.  1. Insulin overdose. Currently, he is not hypoglycemic. Blood sugar is remaining normal, but we will  continue monitoring for the  next few hours every hourly, and then if it is stable, then we will slow down the frequency of checking the blood sugar level. I called psychiatry consult for his suicide attempt. He is currently on involuntary commitment for suicidal attempt.  2. Nyquil overdose. Acetaminophen level is 28 and then coming down further. [facility], they suggested to monitor the level. We will check one more time in the afternoon and currently LFTs are under control and his acetaminophen level is low  so he do not need any therapy for that. We will continue monitoring.  3. Depression. We called psychiatry consult for further management of this issue. 4. Deep vein thrombosis prophylaxis with heparin.   TOTAL TIME SPENT ON THIS ADMISSION: 50 minutes.    ____________________________ Hope PigeonVaibhavkumar G. Elisabeth PigeonVachhani, MD vgv:sg D: 09/22/2013 11:59:01 ET T: 09/22/2013 12:50:57 ET JOB#: [id number]  cc: Hope PigeonVaibhavkumar G. Elisabeth PigeonVachhani, MD, <Dictator> Altamese DillingVAIBHAVKUMAR Shigeru Lampert MD ELECTRONICALLY SIGNED 10/07/2013 8:10

## 2015-03-13 NOTE — Consult Note (Signed)
Brief Consult Note: Diagnosis: Mood disorder NOS, Borderline personality disorder, Alcohol dependence.   Patient was seen by consultant.   Recommend further assessment or treatment.   Orders entered.   Comments: Responded to Code 300 after pt became agitated and was trying to leave. He was asking for Haldol with his nursing staff but they declined as he was not supposed to get in respond to anxiety symptoms. He threatened to leave and picked up all his stuff. Code was called as he is under IVC.   During my interview, pt was calm and stated that he is feeling anxious for unknown reason. He feels that he needs some medications for the same. He feels good with "ativan ". He stated that he does not have any outpt provider and is trying to set up with ACT team before D/C. He denied SI/HI or plans.  Advised him that I will review his meds and prescribe fro anxiety.   Plan: Pt does not meet criteria for inpt admission.  I will add Seroquel 50mg  in AM and 25mg  po Q 3 pm.  He gets Seroquel 200mg  po qhs.  Pt will be evaluated by [facility] team and can be d/c when medically stable.  Electronic Signatures: Rhunette CroftFaheem, Larin Depaoli S (MD)  (Signed 30-Oct-14 11:21)  Authored: Brief Consult Note   Last Updated: 30-Oct-14 11:21 by Rhunette CroftFaheem, Landi Biscardi S (MD)

## 2015-03-13 NOTE — Consult Note (Signed)
Details:   - Psychiatry: Followup note on this patient currently in the hospital recovering from self-inflicted overdose of insulin and lithium.  Patient has no new complaints today. He says that he is feeling better. He still feels a little bit sore and a little bit tired. He is feeling less sick than he was yesterday. Patient's affect is less angry and less depressed. He continues to endorse some suicidal ideation but is not nearly as definite about his intent. He does not appear to be currently psychotic.  Patient with repeated serious suicide attempts. Diagnosis most likely personality disorder with borderline and antisocial features combined with borderline intellectual disability and and alcohol dependence. Patient has failed to respond to inpatient psychiatric treatment and failed to comply with recommended outpatient treatment. Patient continues to be at high risk for injuring himself particularly if allowed to leave the hospital on his own. Patient has shown repeated poor judgment. He has shown an inability to care for himself on even the most basic level. His responsibility and reliability are extremely poor. At this point I do not think that the patient is trustworthy in any way particularly when it comes to serious matters about his safety and health.  Case discussed with the on-call internal medicine physician. This case has been discussed with the full inpatient clinical psychiatry team. This upcoming Monday we intend to have a meeting involving risk management and the hospital attorney about this patient's case. In the meantime I have requested that the patient continue on the internal medicine service because it may facilitate [facility]. I appreciate medicine being accommodating about this. No change to medication. Continue to follow.   Electronic Signatures: Audery Amellapacs, John T (MD)  (Signed 05-Apr-14 19:50)  Authored: Details   Last Updated: 05-Apr-14 19:50 by  Audery Amellapacs, John T (MD)

## 2015-03-13 NOTE — Consult Note (Signed)
Brief Consult Note: Diagnosis: personality disorder/alcohol abuse.   Patient was seen by consultant.   Consult note dictated.   Orders entered.   Comments: Psychiatry: Patient seen. Chart reviewed. Patient well known to me. Not having major depression. Denies any suicidal ideation or intent currently. Sitter can be dc'ed. Patient can be discharged home with ACT team follow up.  Electronic Signatures: Clapacs, Jackquline DenmarkJohn T (MD)  (Signed 05-Dec-14 12:02)  Authored: Brief Consult Note   Last Updated: 05-Dec-14 12:02 by Audery Amellapacs, John T (MD)

## 2015-03-13 NOTE — Discharge Summary (Signed)
PATIENT NAME:  [name], [name] MR#:  [id number] DATE OF BIRTH:  [birth year]  DATE OF ADMISSION:  11/27/2012 DATE OF DISCHARGE:  11/29/2012  PRIMARY CARE PHYSICIAN: Dione HousekeeperMario Ernesto Olmedo, MD  DISCHARGE DIAGNOSES:  1.  Drug overdose/insulin overdose.  2.  Suicide attempt. 3.  Hypoglycemia.  4.  Hypertension.  5.  Diabetes.  6.  Alcohol abuse.  7.  Tobacco abuse.   CODE STATUS: Full code.   CONDITION: Stable.   MEDICATIONS: Nexium 40 mg p.o. daily., Norvasc 10 mg p.o. daily, enalapril 20 mg p.o. daily, clonidine 0.1 mg tablets 1 tablet every 3 hours p.r.n. for hypertension withdrawal symptoms, nicotine patch 21 mg/24 hour transdermal film 1 patch once a day, folic acid 1 mg p.o. daily, thiamine 100 mg p.o. daily.   STOPPED MEDICATION: Humulin 70/30 subcutaneous 22 units b.i.[name].   DIET: ADA diet.   ACTIVITY: As tolerated.   FOLLOWUP CARE: Follow up with PCP within 1 to 2 weeks. Follow up with Dr. Toni Amendlapacs, behavioral medicine physician, within 1 to 2 weeks. The patient is being discharged to behavioral medicine unit.   REASON FOR ADMISSION: Intentional insulin overdose.   HOSPITAL COURSE: The patient is a [age] year old Caucasian male with a history of hypertension, diabetes, bipolar disorder, depression, GERD, was brought to the ED after trying to kill himself by taking 600 to 700 units of NovoLog subcutaneously. The patient was reportedly very, very depressed and tried to kill himself. In the ED, Accu-Chek was 22 and he was started with a D10 drip and blood sugar went up to 128. For detailed history and physical examination, please refer to the admission note dictated by Dr. Amado CoeGouru. On admission date, the patient's BUN was 2, creatinine 0.75, sodium 139, potassium 3.1, chloride 106, bicarbonate 19. Urine drug screen was positive for benzodiazepine and cocaine. The patient was admitted for suicidal attempt with insulin drug overdose leading to hypoglycemia. The patient was admitted to CCU and has  been on D10 drip and was closely monitored for blood sugar. For major depression and suicidal attempt, Dr. Toni Amendlapacs evaluated the patient and suggested one-to-one sitter. When the patient is medically stable, the patient can be discharged to behavioral medicine. The patient's hypertension has been stable, treated with hypertension medication. After the above-mentioned treatment, the patient's blood sugar has been stable at about 120. He is off D10 drip and blood sugar is stable. He will be discharged to behavioral medicine today. For alcohol abuse, the patient has been placed on CIWA protocol. Tobacco abuse has been treated with nicotine patch.   I discussed the patient's discharge plan with the patient and the case manager.   TIME SPENT: About 35 minutes.    ____________________________ Shaune PollackQing Moishe Schellenberg, MD qc:jm [name]: 11/29/2012 15:00:00 ET T: 11/29/2012 17:13:39 ET JOB#: [id number]  cc: Shaune PollackQing Jhamir Pickup, MD, <Dictator> Shaune PollackQING Maggy Wyble MD ELECTRONICALLY SIGNED 11/30/2012 15:18

## 2015-03-13 NOTE — Discharge Summary (Signed)
PATIENT NAME:  [name], [name] MR#:  [id number] DATE OF BIRTH:  03-Dec-1975  DATE OF ADMISSION:  12/16/2012 DATE OF DISCHARGE:  12/17/2012  DISCHARGE DIAGNOSES: 1. Insulin overdose now medically clear and close to his baseline.  2. Suicide attempt, evaluated by psychiatry and transferred to inpatient behavior medicine. 3. Hypoglycemia likely due to insulin overdose, now resolved.  4. Insulin-dependent diabetes mellitus, very labile.  5. Alcohol and tobacco abuse, counseled for 3 minutes. 6. Depression, followed by psychiatry.   SECONDARY DIAGNOSIS: Hypertension.   CONSULTANT: Psychiatry consultation with Dr. Jennet MaduroPucilowska.  PROCEDURES/RADIOLOGY: None.   MAJOR LABORATORY PANEL: UA on admission was negative.   Serum Tylenol level less than 2 and salicylate level less than 1.7.   HISTORY AND SHORT HOSPITAL COURSE: The patient is a 39 year old male with above-mentioned medical problems who was admitted for insulin overdose. He was watched on the medical floor the first 24 hours in ICU and subsequently transferred to the floor. Was kept on dextrose water. Please see Dr. Riley Nearingarwish's dictated history and physical for further details. His sugars started getting better off dextrose water and was just monitored for hypoglycemia for 24 hours and he remained stable. His blood sugar had been in good range without any sugar water and was medically clear to be transferred to inpatient behavior medicine based on psychiatry evaluation who had accepted him. On the date of discharge, his vital signs were as follows: Temperature 98.3, heart rate 71 per minute, respirations 19 per minute, blood pressure 114/73 mmHg and he was saturating 97% on room air.   PERTINENT PHYSICAL EXAMINATION ON THE DATE OF DISCHARGE:  CARDIOVASCULAR: S1, S2 normal. No murmur, rub or gallop.  LUNGS: Clear to auscultation bilaterally. No wheezing, rales, rhonchi or crepitation.  ABDOMEN: Soft, benign.  NEUROLOGIC: Nonfocal examination. All  other physical examination remained at baseline.   DISCHARGE MEDICATIONS: 1. Enalapril 20 mg p.o. daily.  2. Nexium 40 mg p.o. daily.  3. Norvasc 10 mg p.o. daily.   The rest of his psychiatric medication can be resumed based on psychiatric evaluation while in inpatient behavior medicine. His insulin should be held on discharge from psychiatry unit until evaluated by his primary care physician, Dr. Zada Finderslmedo.  TOTAL TIME: 55 minutes. ____________________________ Ellamae SiaVipul S. Sherryll BurgerShah, MD vss:sb [name]: 12/21/2012 11:21:33 ET T: 12/21/2012 13:41:33 ET JOB#: [id number]  cc: Deontrae Drinkard S. Sherryll BurgerShah, MD, <Dictator> Dione HousekeeperMario Ernesto Olmedo, MD Jolanta B. Jennet MaduroPucilowska, MD Patricia PesaVIPUL S Denielle Bayard MD ELECTRONICALLY SIGNED 12/22/2012 11:20

## 2015-03-13 NOTE — Consult Note (Signed)
Brief Consult Note: Diagnosis: Mood disorder NOS, Borderline personality disorder, Alcohol dependence.   Patient was seen by consultant.   Consult note dictated.   Recommend further assessment or treatment.   Orders entered.   Discussed with Attending MD.   Comments: Mr. Shifflett is nomlonger suicidal or homicidal. he met with ACT team and will follow up with the on Tuesday. he is forward thinking and optimistic about the future.   PLAN: 1. The patient no longer meets criteria for IVC. I will terminate proceedings. Please discharge as appropriate. D/C sitter.   2. The patient is to continue Seroquel 200 mg at night for mood stabilization.   3. Case discussed with the attending and Chrys Racer, CSW.  Electronic Signatures: Orson Slick (MD)  (Signed 31-Oct-14 15:42)  Authored: Brief Consult Note   Last Updated: 31-Oct-14 15:42 by Orson Slick (MD)

## 2015-03-13 NOTE — Consult Note (Signed)
PATIENT NAME:  [name], [name] MR#:  [id number] DATE OF BIRTH:  1976-06-19  DATE OF CONSULTATION:  11/27/2012  REFERRING PHYSICIAN:   CONSULTING PHYSICIAN:  Shane Amel, MD  IDENTIFYING INFORMATION AND REASON FOR CONSULT: This is a 39 year old man admitted to the hospital after intentionally overdosing on insulin. Psychiatric consult for suicidality.   HISTORY OF PRESENT ILLNESS: Information obtained from the patient and the chart. The patient presented to the Emergency Room late yesterday after having taken allegedly 600 to 700 units of insulin in a suicide attempt. He was clearly stating suicidal ideation on admission. When I saw the patient today, he has sobered up and he still tells me that he was trying to kill himself. He stays depressed all the time. Never gets better. Drinks about a 12-pack a day. Denies that he is using other drugs. Says that he feels down and tired all the time. Has little to live for in his life. He is not currently getting any outpatient psychiatric treatment at all.   PAST PSYCHIATRIC HISTORY: This gentleman has made a remarkable number of serious suicide attempts over the last few years, using escalating doses of insulin. He has been treated for depression at times with Zoloft, which does not seem to have clearly helped. [facility] [place] [facility] at least one occasion and relapsed into drinking almost immediately after getting out. He tells me that he cannot remember ever having been treated for depression or substance abuse or ever tried to stop drinking. The patient has a history of severe depression as an adolescent when his parents divorced. Seems to have been depressed and down and mood unstable pretty much his whole life.   PAST MEDICAL HISTORY: Somewhat confusing. 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 More recently, it seems that they have been thought of as being more  pseudoseizures. He had been on anticonvulsants consistently for years, but it looks like now he is not getting any. He does have diabetes but says that he does not actually use any insulin at home, needing to only take metformin. He is on Norvasc. He tells me that he is not on any antidepressants, although he was on Zoloft when he was here last time a few months ago.   SOCIAL HISTORY: Lives alone. Not working. Drinks about a 12-pack a day of beer. No girlfriend. Closest relationship is with his father and his sister.   REVIEW OF SYSTEMS: The patient is feeling tired and feeling like he has aching and in pain all over his body. Still feels depressed. Still has suicidal ideation. Denies hallucinations. Denies homicidal ideation.   MENTAL STATUS EXAMINATION: Disheveled, sick-looking overweight man interviewed in a hospital bed. Eye contact normal. Psychomotor activity very slow. Speech decreased in total amount and flow. Affect flat. Mood stated as depressed and bad. Thoughts lucid without clear delusions but slow. Concrete. Endorses suicidal ideation. Denies homicidal ideation. Denies hallucinations. Judgment and insight poor. His baseline intelligence is probably normal.   ASSESSMENT: A patient with severe major depression, although it is complicated by his nonstop abuse of alcohol. Multiple serious suicide attempts, of which this was undoubtedly one. Unlike some previous episodes, he is actually continuing to endorse suicidal ideation a day after coming into the hospital. The patient needs further psychiatric treatment, even though he has been noncompliant and nonresponsive in the past.   TREATMENT PLAN: He is already under involuntary commitment  petition. He can be transferred to psychiatry tomorrow or as soon as his medical situation is stabilized. I anticipate we will probably once again try to send [facility] [place], but with treatment of depression here in  the hospital in between.   DIAGNOSES PRINCIPAL AND PRIMARY:  AXIS I:  1.  Major depression, severe, recurrent.  2.  Alcohol dependence.  AXIS II: Dependent borderline features.  AXIS III: Status post near fatal overdose. Diabetes. History of seizures.  AXIS IV: Severe chronic stress from burden of illness, lack of primary support.  AXIS V: Functioning at time of evaluation: 25.    ____________________________ Shane AmelJohn T. Clapacs, MD jtc:jm D: 11/27/2012 19:22:08 ET T: 11/27/2012 20:33:52 ET JOB#: [id number]  cc: Shane AmelJohn T. Clapacs, MD, <Dictator> Shane AmelJOHN T CLAPACS MD ELECTRONICALLY SIGNED 11/28/2012 10:11

## 2015-03-13 NOTE — Consult Note (Signed)
Psychiatry: Patient continues to stay on the medical unit while we await [facility]. I have renewed his involuntary commitment today. The patient is more clear her to bowl. He is getting very restless and starting to express it. He is clearly bored with his environment. He has asked to come down to the psychiatry ward to participate in groups. I have discussed this with the medical team. I have also discussed this with the head of the behavioral health unit. Unfortunately it appears that he cannot attend groups without being formally admitted to the unit. At this point we continued to hope for a [facility]. Reassurance offered to the patient. No change the medicine.  Electronic Signatures: Clapacs, Jackquline DenmarkJohn T (MD)  (Signed on 17-Apr-14 22:00)  Authored  Last Updated: 17-Apr-14 22:00 by Audery Amellapacs, John T (MD)

## 2015-03-13 NOTE — Consult Note (Signed)
Brief Consult Note: Diagnosis: Major depressive disorder recurrent severe, alcohol dependence.   Patient was seen by consultant.   Consult note dictated.   Recommend further assessment or treatment.   Discussed with Attending MD.   Comments: [name] has a h/o depression, mood instability and substance abuse. He has been hospitalized multiple times for suicide attempts by insulin OD. He was discharged from my service 2 days prior to current OD.   PLAN: 1. Will transfer to psychiatry once medically cleared.   2. He does not require CIWA.  Electronic Signatures: Kristine LineaPucilowska, Brenden Rudman (MD)  (Signed 13-Jan-14 17:55)  Authored: Brief Consult Note   Last Updated: 13-Jan-14 17:55 by Kristine LineaPucilowska, Adham Johnson (MD)

## 2015-03-13 NOTE — Consult Note (Signed)
PATIENT NAMLorriane [name]:  Underwood, [name] OF BIRTH:  13-Jan-1976 OF ADMISSION:  02/21/2014OF DISCHARGE:  01/11/2013  REFERRING PHYSICIAN:  Auburn BilberryShreyang Patel, MD PHYSICIAN:  Kristine LineaJolanta Kaceton Vieau, M.D.  FOR CONSULTATION: To evaluate a patient after a suicide attempt.  DATA:  The patient is a 39 year old male with a history of substance abuse and multiple suicide attempts by insulin overdose.  COMPLAINT: "I am tired."  OF PRESENT ILLNESS: The patient has been hospitalized [facility] for multiple suicide attempts by insulin overdose. He returns to the Emergency Room again after an overdose on insulin. Following his last hospitalization in Behavioral Medicine, the patient was transferred to [facility] rehab facility as sometimes he uses Insulin while drunk. He was discharged from [facility] on 02/17. He reportedly used Lantus insulin belonging to his father as no insulin has been prescribed to [name] himself in a long time. In addition he reports taking Tegretol overdose. He reports no depressive symptoms and wants to go home to be with his dogs. He has no explanation for his actions. He does not take any prescribed medications in the community, neither he follows up with outpatient providers.  PSYCHIATRIC HISTORY: There are multiple hospitalizations for suicide attempts. His first extended hospitalization as a child was in [name] following his parents? divorce. He was hospitalized here after what he called accidental insulin overdose and now increasingly admits that it was a suicide attempt. He has been tried on numerous medications including Prozac, Tegretol, lithium, Valium and Zoloft, but has never been compliant.  PSYCHIATRIC HISTORY: None reported.   MEDICAL HISTORY: Hypertension, diabetes, GERD, asthma, alcoholic hepatitis, pseudoseizures.   ALLERGIES: ASPIRIN, CODEINE, MOTRIN, PENICILLIN, TRAMADOL.  AT THE TIME OF LAST DISCHARGE:  Amlodipine 10 mg daily. Enalapril 20  mg daily.  Nexium 40 mg daily.  Prozac 40 mg daily. Seroquel 150 mg at bedtime. Seroquel 50 mg twice daily.  CONFIRMED BY THE PHARMACY TECHNICIAN: Metformin 500 mg in the morning. Prilosec 20 mg daily.   Tegretol 200 mg twice daily.  HISTORY: He apparently lives alone now. His father live in a trailer next door. He is very isolated. His ex-girlfriend spends some time with him. It is unclear where he gets his insulin. The patient has not been prescribed any insulin for a long time. He, reportedly  uses  insulin belonging to his father. I called [facility] pharmacy as indicated by the patient but no record of buying insulin exists. I have learned that if he buys insulin without a prescription, which [facility], there would be no record. Of note, he attempted to inject insulin while in the hospital during previous admission. The patient is disabled. He has Medicaid.  OF SYSTEMS: CONSTITUTIONAL: No fevers or chills. No weight changes. No double or blurred vision. EARS, NOSE, THROAT: No hearing loss. RESPIRATORY: No shortness of breath or cough. CARDIOVASCULAR: No chest pain or orthopnea. GASTROINTESTINAL: No abdominal pain, nausea, vomiting or diarrhea. No incontinence or frequency. ENDOCRINE: Positive for overdose on insulin. LYMPHATIC: No anemia or easy bruising. INTEGUMENTARY: No acne or rash. MUSCULOSKELETAL: No muscle or joint pain. NEUROLOGIC: No tingling or weakness. PSYCHIATRIC: See history of present illness for details.  EXAMINATION: VITAL SIGNS: Blood pressure 138/79, pulse 79, respirations 16, temperature 97.5. GENERAL: This is a slightly obese somnolent male in no acute distress. The rest of physical examination is deferred to his primary attending.  DATA: Chemistries are within normal limits except K 2.8. Blood sugar on admission 38. Blood  alcohol level is 0.135. LFTs within normal limits. Urine tox screen negative for substances. CBC within normal limits. Urinalysis is not  suggestive of urinary tract infection. Tegretol level 16. STATUS EXAMINATION: The patient assessed in CCU. He has a Comptroller. He is asleep but easily arausable. He is oriented to person, place, time and situation. He recognizes me from previous admissions. He is marginally cooperative. He is wearing hospital scrubs. He looks unkempt. There is minimal eye contact. His speech is soft. Mood is anxious. Thought processing is slow. Thought content: He denies suicidal or homicidal ideation, but was admitted after a suicide attempt by insulin and Tegretol overdose. There are no psychotic symptoms. His cognition is difficult to assess, too upset to participate. His insight and judgment are extremely poor. RISK ASSESSMENT: This is a patient with a long history of mental illness, who has been overdosing on insulin, never able to explain why. He uses substances and does not follow up with outpatient treatment in spite of outpatient substance abuse involuntary commitment.  I:  Alcohol dependence.    Bipolar affective disorder II. XIS II:  Deferred. III:  Obesity, hypertension, diabetes. IV:  Mental illness, primary support, relationship, extremely poor coping skills. V:  Global Assessment of Functioning 25.     1. Please continue IVC and sitter. No psychotropic medications indicated as the patient is sedated. He never takes any medications at home or follows up with his providers.  The patient showed over again that he does not benefit from psychiatric or substance abuse hospitalizations. We will attempt to discharge to home from medical floor. I will follow up. I am not at the hospital this weekend. Please page me if problems.      Electronic Signatures: Kristine Linea (MD) (Signed on 24-Feb-14 06:04)  Authored   Last Updated: 27-Feb-14 19:29 by Kristine Linea (MD)

## 2015-03-13 NOTE — Discharge Summary (Signed)
PATIENT NAME:  [name], [name] MR#:  [id number] DATE OF BIRTH:  04/06/1976  DATE OF ADMISSION:  10/15/2013 DATE OF DISCHARGE:  10/17/2013  ADMISSION AND DISCHARGE DIAGNOSES: 1.  Hypoglycemia from suicidal ideation taking insulin.  2.  Intentional overdose of insulin.  3.  Personality disorder.  4.  Alcohol/Tobacco abuse.   CONSULTATIONS:  Psychiatry.  HOSPITAL COURSE:  This is a 39 year old male who presents again to our hospital withhypogycemia due to intentional insulin overdose. For further details, please refer to the H and P.   1.  Hypoglycemia secondary to intentional overdose of insulin. The patient has had numerous hospitalizations for this. For his hypoglycemia, he was placed on a glucagon drip, as well as a D10 drip. His blood sugars were monitored carefully and once stable these drips were turned off.  He does not have diabetes. He uses his father's insulin on each occasion. His blood sugars have remained stable off of the drips..  2.  Intentional overdose as suicidal gesture. The patient has been seen by psychiatry numerous times.Marland Kitchen. He is well known to the psychiatry service, as well as hospital administrative staff. According to the psychiatrist, inpatient hospitalization psychiatrically has not been of any benefit to the patient, particularly not short-term hospitalization. The patient has is followed by the ACT team, which is probably the best team or treatment that could be provided for him.  At this time, the patient is not a danger to himself or others.  3.  Alcohol abuse. The patient was placed on CIWA protocol.  4.  Recent rib fractures. The patient was placed on p.r.n. pain medication and supportive care.  5.  Tobacco abuse. We did counsel the patient regarding stopping smoking.   DISCHARGE MEDICATIONS: 1.  Nexium 40 mg b.i.d.  2.  Topiramate 50 mg b.i.d.  3.  Seroquel 200 mg at bedtime. 4.  Nicotine 2 mg q. hour as needed for nicotine cravings.  5.  Tylenol, oxycodone  325/5 mg 1 tablet q. 6 hours, #20.   DISCHARGE DIET: Regular.   DISCHARGE ACTIVITY: As tolerated.  DISCHARGE REFERRAL:  ACT team.  DISCHARGE FOLLOWUP: The patient will follow up with Dr.Olmeda in 2 weeks.   TIME SPENT:  35 minutes  The patient is medically stable for discharge.    ____________________________ Tyheem Boughner P. Juliene PinaMody, MD spm:ce D: 10/17/2013 14:09:34 ET T: 10/17/2013 15:30:05 ET JOB#: [id number]  cc: Capucine Tryon P. Juliene PinaMody, MD, <Dictator> Dr. Earnie Larssonmeda Icesis Renn P Jaimie Redditt MD ELECTRONICALLY SIGNED 10/17/2013 21:30

## 2015-03-13 NOTE — Consult Note (Signed)
Details:   - Psychiatry: PAtient continues in his usual mental state. I have renewed the commitment paperwork for him. [facility] now says he is "on the waiting list". Will get further clarification before deciding whether to move [facility].   Electronic Signatures: Audery Amellapacs, Janvi Ammar T (MD)  (Signed 08-Apr-14 22:15)  Authored: Details   Last Updated: 08-Apr-14 22:15 by Audery Amellapacs, Shakia Sebastiano T (MD)

## 2015-03-13 NOTE — Discharge Summary (Signed)
PATIENT NAME:  [name], [name] MR#:  [id number] DATE OF BIRTH:  September 27, 1976  DATE OF ADMISSION:  02/19/2013 DATE OF DISCHARGE:  03/14/2013    ADMITTING PHYSICIAN: Ramonita LabAruna Gouru, M.D.  DISCHARGING PHYSICIAN: Enid Baasadhika Denver Bentson, M.D.   PRIMARY CARE PHYSICIAN: None.   CONSULTATIONS IN THE HOSPITAL:  1.  Psychiatric consultation with Dr. Toni Amendlapacs.  2.  Orthopedic consultation with Dr. Gerrit Heckaliff.  For more details, look at the interim discharge summary dictated by Dr. Nemiah CommanderKalisetti on 03/09/2013 and also history and physical done by Dr. Amado CoeGouru.   DISCHARGE DIAGNOSES: 1.  Major depression with suicide attempt with insulin overdose.  2.  Hypertension.  3.  Fall on left hand 5th finger with proximal interphalangeal joint subluxation.  4.  Tobacco use disorder.  5.  Acute metabolic encephalopathy on admission, which resolved.   DISCHARGE HOME MEDICATIONS:  1.  Amlodipine 10 mg p.o. daily.  2.  Enalapril 20 mg p.o. daily.  3.  Fluoxetine 40 mg p.o. daily.  4.  Prilosec 20 mg p.o. daily.  5.  Norco 5/325 mg one tablet q. 8 hours p.r.n. for pain.  6.  Gabapentin 300 mg p.o. 3 times a day.  7.  Clonazepam 0.5 mg b.i.d. as needed for anxiety.  8.  Quetiapine 100 mg 4 times a day.  9.  Quetiapine 100 mg at bedtime.   DISCHARGE DIET: Regular diet.   DISCHARGE ACTIVITY: As tolerated.   FOLLOWUP INSTRUCTIONS: The patient is being [facility].   BRIEF HOSPITAL COURSE: Please look at the history and physical dictated by Dr. Amado CoeGouru and also the consultation note and discharge summary dictated by Dr. Toni Amendlapacs for full details. [name] is a 39 year old Caucasian male with multiple admissions to the hospital with depression and intentional insulin overdose. The patient is not a diabetic, and he states that he overdoses on his dad's insulin. He had several admissions to behavioral medicine, as well as medical unit, and upon discharge, he continues to do the same. He was very  manipulative while in the hospital and contracts for safety; however, ends back up in the hospital with the similar behavior. He recently was here with lithium, as well as insulin overdose. Lithium was stopped, and while he was here, medications were being adjusted with quetiapine for his anxiety issues. He is on Percocet p.r.n. and Klonopin p.r.n., and the medications were being adjusted by psychiatrist. Because of his multiple hospitalizations and recurrent behavior not responding to treatment, the patient is being discharged [facility] hopefully for longer treatment for his chronic personality disorder and mood instability.   DISCHARGE CONDITION: Guarded.   DISCHARGE DISPOSITION: To [facility] under involuntary commitment   TIME SPENT ON DISCHARGE: Thirty-five minutes.   ____________________________ Enid Baasadhika Jaqua Ching, MD rk:lg D: 03/14/2013 14:47:24 ET T: 03/14/2013 15:06:14 ET JOB#: [id number]  cc: Enid Baasadhika Chavela Justiniano, MD, <Dictator> Enid BaasADHIKA Nayleen Janosik MD ELECTRONICALLY SIGNED 03/21/2013 15:43

## 2015-03-13 NOTE — Consult Note (Signed)
Brief Consult Note: Diagnosis: Borderline personality disorder, Mood disorder NOS, Alcohol dependence.   Recommend further assessment or treatment.   Discussed with Attending MD.   Comments: [name] [facility].  PLAN: I spoke with Dr. Sherryll [name] this morning. We will transfer the patient to psychiatry as soon as he is medically cleared. Please call [phone] to coordinate transfer.  Electronic Signatures: [name], [name] (MD)  (Signed 28-Feb-14 10:41)  Authored: Brief Consult Note   Last Updated: 28-Feb-14 10:41 by [name], [name] (MD)

## 2015-03-13 NOTE — H&P (Signed)
PATIENT NAME:  [name], [name] MR#:  [id number] DATE OF BIRTH:  Dec 16, 1975  DATE OF ADMISSION:  11/29/2012  REFERRING PHYSICIAN:  Dr. Ramonita Lab.  ATTENDING PHYSICIAN:  Kristine Linea, M.D.   IDENTIFYING DATA:  [name] is a 39 year old male with history of depression, alcoholism and multiple suicide attempts.   CHIEF COMPLAINT:  " I need to go home."   HISTORY OF PRESENT ILLNESS:  The patient was admitted to the medical floor after an another overdose on 600 or 700 units of insulin. He admitted to Dr. Toni Amend, who did see him in consultation on the medical floor, that it was a suicide attempt. The patient endorses many symptoms of depression with poor sleep, decreased appetite, anhedonia, feeling of guilt, hopelessness, worthlessness, social isolation, heightened anxiety, poor memory and concentration and lack of energy. The patient reports that he has used his father's insulin supply to overdose. He was hospitalized for similar scenario in September 2013. At this time, he was taken off insulin and started on metformin. Apparently, no insulin is being prescribed to him, but he was able to use his father's insulin. It is puzzling that he has access to his father's medications, but he explains that before coming to the hospital, his father moved in with the patient. His father was hospitalized at [facility] at the same time the patient was admitted. When patient arrived on the unit today, he has been crying and in despair, hitting walls, presumably mourning his father's death.  We were able to clarify with medical floor that his father was discharged to a skilled nursing facility yesterday. We called [facility] to confirm that his father is in rehab and doing very, very well. The patient made no comment when informed that his father is well and alive. I believe that he was trying to use this excuse to be discharged to home today.  The patient report to escalating drinking,  he tells 2 beers, but to Dr. Toni Amend, he indicated a 12-pack a day.  There is a history of alcoholism, and the patient was referred to [facility] in the summer of last year. He completed treatment at [facility] and was able to maintain sobriety for 3 or 4 weeks. He report heightened anxiety with panic attacks. He has been demanding Ativan here. He is not positive for benzodiazepines on admission, but I wonder if he had access to his own or his father's benzodiazepines in the past. On medical floor, he was started on CIWA protocol. He denies psychotic symptoms. There are no symptoms suggestive of bipolar mania. He denies illicit drugs or prescription pill abuse.   PAST PSYCHIATRIC HISTORY: He has been hospitalized several times. His first hospitalization as a child was at [facility]. He was kept in the hospital for almost 2 years. This was following his parents' divorce. He has been hospitalized here multiple times, sometimes on the medical floor for accidental insulin overdose, often brought to psychiatry following an overdose. He has been tried on numerous medications including Prozac, Tegretol, lithium, Valium and Zoloft. He has not been taking any medications recently.   FAMILY PSYCHIATRIC HISTORY:  None reported.   PAST MEDICAL HISTORY:  Hypertension, diabetes, GERD, asthma, alcoholic hepatitis, history of pseudoseizures.   ALLERGIES: ASPIRIN, CODEINE, MOTRIN, PENICILLIN, TRAMADOL.   MEDICATIONS ON ADMISSION: Thiamine 100 mg daily, Norvasc 10 mg daily, Nexium 40 mg daily, folic acid 1 mg daily, enalapril 20 mg daily, clonidine 0.1 mg every 3 hours as needed for  hypertension.   SOCIAL HISTORY:   He lives with his father in a trailer, if he can be trusted. He has Medicare and Medicaid.  He has a history of drinking and treatment noncompliance.     REVIEW OF SYSTEMS:   CONSTITUTIONAL:  No fevers or chills. No weight changes.  EYES:  No double or blurred vision.  ENT:  No hearing loss.   RESPIRATORY:  No shortness of breath or cough.  CARDIOVASCULAR:  No chest pain or orthopnea.  GASTROINTESTINAL:  No abdominal pain, nausea, vomiting or diarrhea.  GENITOURINARY:  No incontinence or frequency.  ENDOCRINE:  No heat or cold intolerance.  LYMPHATIC:  No anemia or easy bruising.  INTEGUMENTARY:  No acne or rash.  MUSCULOSKELETAL:  Positive for bruises on both hands from hitting the wall.  NEUROLOGIC:  No tingling or weakness.  PSYCHIATRIC:  See history of present illness for details.   PHYSICAL EXAMINATION: VITAL SIGNS:  Blood pressure 138/84, pulse 84, respirations 18, temperature 96.8.  GENERAL:  This is a slightly obese, unkept male, in no acute distress.  HEENT:  The pupils are equal, round and reactive to light. Sclerae anicteric.  NECK:  Supple. No thyromegaly.  LUNGS:  Clear to auscultation. No dullness to percussion.  HEART:  Regular rhythm and rate. No murmurs, rubs or gallops.  ABDOMEN:  Soft, nontender, nondistended. Positive bowel sounds.  MUSCULOSKELETAL:  Normal muscle strength in all extremities.  SKIN:  No rashes. Positive for scratches and bruises on both hands.  LYMPHATIC:  No cervical adenopathy.  NEUROLOGIC:  Cranial nerves II through XII are intact.   IMAGING AND LABORATORY DATA:  Chemistries are within normal limits except for blood glucose of 182. LFTs within normal limits except for AST of 49. Blood alcohol level on admission 0.2. TSH 2.09. Tegretol level less than 0.5. Urine tox screen positive for benzodiazepine and cocaine. CBC within normal limits except for low platelets of 148. Urinalysis is not suggestive of urinary tract infection.  EKG: Normal sinus rhythm, prolonged QT, abnormal EKG.   MENTAL STATUS EXAMINATION ON ADMISSION: The patient is alert and oriented to person, place, time and somewhat to situation. He believes that his father died, which certainly is not the case. He is irritable, unpleasant and badly cooperative. There is some  psychomotor agitation. He has been hitting the wall on the medical floor, as well as here, in his mind because of losing his father. He is unkept. He is wearing private clothes.  There is minimal eye contact. His speech is soft. Mood is depressed with labile affect. Thought processing is slow. Thought content: He denies suicidal or homicidal ideation, but was admitted to the hospital after a serious overdose on insulin. There are no delusions or paranoia. There are no auditory or visual hallucinations. His cognition is difficult to assess, as the patient is unwilling to participate. His insight and judgment are extremely poor.   SUICIDE RISK ASSESSMENT ON ADMISSION:  This is a patient with a long history of mood instability and multiple suicide attempts by insulin overdose. He admits to being severely depressed and suicide.   INITIAL DIAGNOSES:  AXIS I: Mood disorder, not otherwise specified; alcohol dependence, cocaine abuse, benzodiazepine dependence.  AXIS II:  Personality disorder, not otherwise specified.  AXIS III:  Hypertension, diabetes, gastroesophageal reflux disease, alcoholic hepatitis, pseudoseizures.  AXIS IV:  Mental illness, substance abuse, primary support, family conflict.  AXIS V:   Global Assessment of Functioning on admission 25.   PLAN:  The  patient was admitted to [facility] behavioral medicine unit for safety, stabilization and medication management. He was initially placed on suicide precautions and was closely monitored for any unsafe behaviors. He underwent full psychiatric and risk assessment. He received pharmacotherapy, individual and group psychotherapy, substance abuse counseling and support from therapeutic milieu.  1.  Suicidal ideation: The patient is no longer suicidal. He is able to contract for safety in the hospital. Note that the patient was discharged from the hospital last time on a combination of Zoloft and Seroquel. We will most likely  restart that.  2.  Alcohol and benzodiazepine dependence. He was started on CIWA protocol on the medical floor. We will continue Ativan taper, as the patient demands benzodiazepines all the time. We will monitor vital signs.  3.  Substance abuse treatment. He probably needs to be referred to [facility] again. He complains that his dogs and cats are at home alone and nobody to feed them. Social worker will look into it.  4.  Medical:  We will continue all medications as prescribed at the time of discharge from the medical floor.  5.  Diabetes. We will monitor blood sugars. They are usually low after the overdose. We will ask medicine for guidance if necessary.   DISPOSITION:  To be established.      ____________________________ Ellin GoodieJolanta B. Jennet MaduroPucilowska, MD jbp:dm D: 11/29/2012 18:47:45 ET T: 11/29/2012 19:40:56 ET JOB#: [id number]  cc: Akari Crysler B. Jennet MaduroPucilowska, MD, <Dictator> Shari ProwsJOLANTA B Keisy Strickler MD ELECTRONICALLY SIGNED 12/03/2012 0:07

## 2015-03-13 NOTE — Discharge Summary (Signed)
PATIENT NAME:  [name], [name] MR#:  [id number] DATE OF BIRTH:  10-15-1976  DATE OF ADMISSION:  01/15/2013 DATE OF DISCHARGE:  01/17/2013  DISCHARGE DIAGNOSES:  1.  Insulin overdose with intent to harm. Suicide attempt.  2.  Alcohol and tobacco abuse.  3.  Hypertension.  4.  Depression.  5.  Hypoglycemia, now resolved.   SECONDARY DIAGNOSES:  1.  Recurrent admissions for insulin overdose and hypoglycemia. 2.  Hypertension. 3.  Alcohol and tobacco abuse.  4.  Gastroesophageal reflux disease.   CONSULTATIONS: Psychiatry, Dr. Jennet MaduroPucilowska.   PROCEDURES AND RADIOLOGY: None.   MAJOR LABORATORY PANEL: Urinalysis on admission was negative. Serum acetaminophen and salicylate levels were within normal limits.   HISTORY AND SHORT HOSPITAL COURSE: The patient is a 39 year old male with the above-mentioned medical problems who was admitted for insulin overdose along with possible Tegretol overdose. Please see Dr. Serita GritShreyang Patel's dictated history and physical for further details. Per record, he took 1500 units of insulin Lantus and 20 tablets of Tegretol. The patient was monitored closely in the ICU. As he had significant hypoglycemia, he was placed on D5 normal saline and was hydrated aggressively with every 1 hour sugar check. He pulled out his IV on February 23 and was monitored just with oral intake. As he was waking up and was taking good oral intake, his sugar maintained fairly well between 80s to 120s. He was medically clear for inpatient psychiatric transfer. Dr. Jennet MaduroPucilowska from psychiatry evaluated the patient and accepted the patient to inpatient behavioral unit and he was discharged in stable condition.   PHYSICAL EXAMINATION:  VITAL SIGNS: On the date of discharge, his temperature was 98, heart rate 60 per minute, respirations 18 per minute, blood pressure 132/86 mmHg. He was saturating 97% on room air. His blood sugar ranged from the 80s to 130s.  CARDIOVASCULAR: S1, S2 normal. No murmurs,  rubs or gallop.  LUNGS: Clear to auscultation bilaterally. No wheezing, rales, rhonchi or crepitation.  ABDOMEN: Soft, benign.  NEUROLOGIC: Nonfocal examination. All other physical examination remained at baseline.   DISCHARGE MEDICATIONS: Prilosec 20 mg p.o. daily.   DISCHARGE DIET: Low sodium, low fat, low cholesterol, 1800 ADA.   DISCHARGE ACTIVITY: As tolerated.   DISCHARGE INSTRUCTIONS AND FOLLOWUP: The patient was instructed to follow up with his primary care physician, Dr. Zada Finders[facility]medo, [facility] in 2 to 3 days after discharge.   TIME SPENT: Total time discharging this patient was 55 minutes.   ____________________________ Ellamae SiaVipul S. Sherryll BurgerShah, MD vss:jm [name]: 01/17/2013 14:11:50 ET T: 01/17/2013 17:32:00 ET JOB#: [id number]  cc: Norvin Ohlin S. Sherryll BurgerShah, MD, <Dictator> Dione HousekeeperMario Ernesto Olmedo, MD Jolanta B. Jennet MaduroPucilowska, MD Patricia PesaVIPUL S Chantille Navarrete MD ELECTRONICALLY SIGNED 01/19/2013 15:27

## 2015-03-13 NOTE — Consult Note (Signed)
Details:   - Psychiatry: Followup note on this patient who has had multiple Liliane Channel she suicide attempts. Currently in the hospital recovering from an overdose of insulin and lithium. Patient is telling me today that he still feels very depressed. He tells me that if he had an opportunity to try to kill himself again he thinks he would probably do it. He is not able to offer any coherent explanation for his recent behavior. He cannot explain what happened between his leaving the hospital in a good mood and trying to kill himself. He patient seems to still be sedated and feeling quite sick. Not exactly delirious but also not very cooperative in the interview.  Multiple members of the behavioral health clinical services team met today to discuss what should be done in the case of this patient. At this point we are hoping to be able to transfer him to [facility]. The most recent update I had is that they are waiting for medical clearance the before giving [place] any decision about his transfer status. Based on his past history it is unlikely that treatment on our behavioral health unit will be of benefit to the patient. At this point I will continue to followup daily. I do not recommend any psychiatric medication.   Electronic Signatures: Gonzella Lex (MD)  (Signed 03-Apr-14 22:49)  Authored: Details   Last Updated: 03-Apr-14 22:49 by Gonzella Lex (MD)

## 2015-03-13 NOTE — Consult Note (Signed)
PATIENT NAME:  [name], [name] MR#:  [id number] DATE OF BIRTH:  08-Feb-1976  DATE OF CONSULTATION:  10/25/2013.  REFERRING PHYSICIAN:   CONSULTING PHYSICIAN:  Audery Amel, MD  IDENTIFYING INFORMATION AND REASON FOR CONSULT:  A 39 year old man with a long history of suicide attempts, currently in the hospital because of overdose on insulin. Consult for psychiatric assessment and treatment plan.   HISTORY OF PRESENT ILLNESS:  Information obtained from the patient and the chart. The patient is well known to me from multiple prior hospitalizations, consults and behavioral health admissions. This 39 year old man presented to the Emergency Room yesterday, having overdosed himself on insulin. After taking insulin using his father's auto injectors, he called 911 and had himself brought to the hospital. He was admitted to the Critical Care Unit and has been stabilized now. The patient tells me that he felt like he has been doing pretty well recently. The ACT Team is helping him out, visiting him a couple of times a week, taking him out, trying to do some socialization and therapy with him and he thinks it is helping. Yesterday, however, he said that he started to feel a little bit down and depressed. He called the ACT team and could not reach anyone. This made him feel more depressed so he impulsively injected himself with insulin. He tells me he immediately regretted having done it and called 911. He does not report that his mood had otherwise been any worse than usual recently. No psychotic symptoms. No other worsening of any mood problems. In fact, he felt like generally he was doing okay. He does admit that he was drinking and had had about a 6-pack of beer yesterday. He has been compliant with his psychiatric medicine, which is currently supposed to be Wellbutrin, Seroquel and I think Klonopin.   PAST PSYCHIATRIC HISTORY:  This is the latest of innumerable admissions with exactly the same pattern. [name]  has developed a habit of injecting himself with insulin and then calling 911 to have himself brought into the hospital. As a rule he always denies that he was actually trying to kill himself, usually immediately or at least within a day of coming into the hospital, and his behaviors suggest that he was not actually seeking to die given that he usually seeks out treatment for himself immediately and is completely cooperative with medical care. He has had multiple admissions to Psychiatry in the past. We have tried keeping him on the behavioral health unit and monitoring his mood and treating his depression for up to a month at a time. This summer, he was kept on the medical unit for about a month and then [facility] the state psychiatric facility for longer term treatment where he stayed for several months. Despite all this, there has not been a change in his behaviors regarding his insulin overdose. There is really no evidence that what he has is a major depression nor a bipolar disorder. Having observed this repeatedly as well as at length on the behavioral health ward, I think the diagnosis most appropriate is personality disorder along with alcohol abuse and a chronically low cognitive level. He has not benefited from inpatient hospitalization in the past.   PAST MEDICAL HISTORY:  He does not have diabetes. He gets his insulin by stealing it from his father, who has insulin. The patient himself is medically fairly stable.   SOCIAL HISTORY:  He lives more or less on his own. There is a girlfriend, who  is on and off involved in his life. He does get disability. He lives nearby to his father, which is his supply of insulin. He gets lonely very easily when there is not someone around with him. He recently got authorize for ACT services, which he thinks have been helpful, but at least so far does not seem to have changed the frequency of his suicide attempts.   SUBSTANCE ABUSE HISTORY:  Not all of his  admissions happen along with alcohol abuse, but many of them do and yesterday, he was drinking. Alcohol is his problem drug of choice.   MENTAL STATUS EXAMINATION:  A reasonably well-groomed man, wide awake, cooperative with the interview. Good eye contact. Normal psychomotor activity. Speech is normal in rate, tone and volume. Affect slightly blunted but not extremely so. Mood is stated as being okay. Absolutely denies suicidal or homicidal ideation. Thinking appears to be lucid with no obvious psychosis. Denies hallucinations. Denies suicidal or homicidal ideation. Intelligence chronically probably in the mildly low range. Judgment and insight chronically impaired. Alert and oriented x 4.   REVIEW OF SYSTEMS:  Denies feeling depressed. Denies suicidal ideation. Denies psychotic symptoms. Physically, feeling fairly stable. He is able to eat today, not feeling weak anymore. Not shaky.   LABORATORY RESULTS:  Looks like the blood sugars most recently are all back to within the normal range. The lowest I see for this hospital stay was 33 when he came in. He has a drug screen is that positive for MDMA, but that is almost certainly from his Wellbutrin that he takes. It looks like when he came in yesterday, his alcohol level was 294.   ASSESSMENT:  A 39 year old man who makes very frequent suicide attempts or at least suicide "gestures" by injecting himself with insulin. His behavior does not suggest that he is actually trying to kill himself. More importantly we have already tried multiple medications, long-term hospitalizations, intensive therapy, pretty much everything that is available that might help with this and nothing has made any impact so far. Psychiatric hospitalization is not likely to be of any benefit right now.   TREATMENT PLAN:  I discussed with the patient again the way that alcohol makes him more likely to do this and encouraged him to keep working on staying sober. I strongly praised his  interaction with the ACT team. No indication for hospitalization. I will look through his medicines and make sure that he is being given his antidepressant. No other psychiatric treatment necessary at this point in the hospital.   DIAGNOSIS, PRINCIPAL AND PRIMARY:  AXIS I: Personality disorder, not otherwise specified, borderline and histrionic features, most prominent. AXIS II: Alcohol abuse.  AXIS III: Status post insulin overdose.  AXIS IV: Severe, chronic loneliness.  AXIS V: Functioning at time of evaluation 44.    ____________________________ Audery AmelJohn T. Reni Hausner, MD jtc:jm D: 10/25/2013 13:30:21 ET T: 10/25/2013 13:57:37 ET JOB#: [id number]  cc: Audery AmelJohn T. Delaine Canter, MD, <Dictator> Audery AmelJOHN T Miller Ed[place] MD ELECTRONICALLY SIGNED 10/25/2013 18:45

## 2015-03-13 NOTE — Consult Note (Signed)
PATIENT NAME:  [name], [name] MR#:  [id number] DATE OF BIRTH:  03/09/1976  DATE OF ADMISSION: 10/09/2013  DATE OF CONSULTATION:  10/10/2013  REFERRING PHYSICIAN:  Sona A. Posey Pronto, MD CONSULTING PHYSICIAN:  Cordelia Pen. Gretel Acre, MD  HISTORY OF PRESENT ILLNESS: The patient is a 39 year old male with multiple admissions related to insulin overdose who presented again with overdose of Lantus. The patient reported that he took 4 bottles of Lantus, each with 10 mL and each has 100 units.  He reported that he was feeling depressed and he decided to take an overdose of the insulin at this time. The patient reported that he was trying to hurt himself. He was given D50 by the EMS. When he came to the ER, he reported that he wants something for his DTs as he was also drinking at that time.   The patient was recently discharged from the hospital on November 3rd after a brief stay when he had a similar presentation. The patient has been coming to the hospital every 2 weeks with similar presentations, and he takes his father's insulin and will be admitted here briefly and discharged home. He reported that he picked up the insulin from the back of the refrigerator where it was left by his father. He reported that he was currently stressed out because his ex-wife  was talking nonsensical to him. He stated that now he is feeling better and wants to go home.   The patient stated that he does not want to have a sitter in his room. He stated that his blood sugar has been stabilized and wants to go home and be happy. He also mentioned that he recently met with the ACT team and he also tried to call them but nobody responded to his telephone calls. He is trying to contract for safety at this time.   PAST PSYCHIATRIC HISTORY: The patient has a history of multiple psychiatric hospitalizations due to overdose on the insulin. He has been tried on multiple psychotropic medications but remains noncompliant with treatment. He has a long  history of alcoholism, related DWIs and has lost his driver's license. He continued to drive his father's car without license  and eventually his father's car was also impounded.   PAST MEDICAL HISTORY: Frequent episodes of hypoglycemia which are related to his insulin overdose, GERD, hypertension.   CURRENT MEDICAL PROBLEMS: Tobacco abuse and alcohol abuse.   PAST SURGICAL HISTORY: None.   ALLERGIES: No known drug allergies.   CURRENT MEDICATIONS: On admission, amlodipine 10 mg daily, clonazepam 1 mg at bedtime, Nexium 40 mg, quetiapine 200 mg.   SOCIAL HISTORY: The patient currently lives by himself in a trailer home close to his father. He is currently disabled and he has a girlfriend who is still his payee. He stated that his father used to be the source of his insulin, and he is still taking his father's insulin.   REVIEW OF SYSTEMS: CONSTITUTIONAL: Denies any fever or chills. No weight changes.  EYES: No double or blurred vision.  EAR, NOSE AND THROAT: No hearing loss.  RESPIRATORY: No shortness of breath or cough.  CARDIOVASCULAR: No chest pain or orthopnea.  GASTROINTESTINAL: No abdominal pain, nausea, vomiting, diarrhea.  GENITOURINARY: No incontinence or frequency.  ENDOCRINE: No heat or cold intolerance.  LYMPHATIC: No anemia or easy bruising.  INTEGUMENTARY: No acne or rash.  MUSCULOSKELETAL: No muscle or joint pain.  NEUROLOGIC: No tingling or weakness.   PHYSICAL EXAMINATION:  VITAL SIGNS: Temperature 98.2, pulse  115, respirations 18, blood pressure 142/86.   LABORATORY DATA: Glucose 106, but the blood glucose was 49 in the morning. BUN 10, creatinine 0.83, sodium 135, potassium 3.8, chloride 104, bicarbonate 26, GFR 60, anion gap 5. WBC 6.3, RBC 4.53, hemoglobin 13.5, hematocrit 40, platelets are 194, MCV 88, MCH 29.7, RDW 18.8.   MENTAL STATUS EXAMINATION: The patient is a moderately built male who appeared his stated age. He was pleasant, polite and cooperative. He  remembers me from the previous interviews. He maintains fair eye contact. His speech was low in tone and volume. Mood was depressed. Affect was congruent. Thought process was logical and goal directed. Thought content was nondelusional. He currently denied having any suicidal or homicidal ideations or plans. He denied having any perceptual disturbances. He demonstrated poor insight and judgment.   DIAGNOSTIC IMPRESSION:  AXIS I: 1. Mood disorder, not otherwise specified.     2. Alcohol dependence.  AXIS II: Borderline personality disorder.  AXIS III: Hypertension, gastroesophageal reflux disease, status post insulin overdose.   TREATMENT PLAN:  1.  The patient does not meet the criteria for involuntary commitment at this time and I will terminate the proceedings.  2.  I will also discontinue the sitter. 3.  The patient can be discharged once he becomes clinically stable and does not meet the criteria for medical treatment. He will not be given any new psychotropic medications at this time. He will be followed by the [facility] team and they have already evaluated him.   Thank you for allowing me to participate in the care of this patient.   ____________________________ Cordelia Pen. Gretel Acre, MD usf:np D: 10/10/2013 14:22:00 ET T: 10/10/2013 16:12:17 ET JOB#: [id number]  cc: Cordelia Pen. Gretel Acre, MD, <Dictator> Jeronimo Norma MD ELECTRONICALLY SIGNED 10/15/2013 14:00

## 2015-03-13 NOTE — Consult Note (Signed)
Brief Consult Note: Diagnosis: Major depressive disorder recurrent severe, alcohol dependence.   Patient was seen by consultant.   Consult note dictated.   Recommend further assessment or treatment.   Orders entered.   Discussed with Attending MD.   Comments: [name] has a h/o depression, mood instability and substance abuse. He has been hospitalized multiple times for suicide attempts by insulin OD. He was discharged from my service 2 days prior to current OD. He ODed on insuline again while in the hospital.  I attempted to see him today but he was hard to arouse after a dose of Ativan.  PLAN: 1. My offer stands. Will transfer to psychiatry once medically cleared with a plan to send him to [facility] rehab facility.  2. He does not require CIWA. He is addicted to benzos and alcohol. I do not recommend giving hin Ativan at this point. I am aware that he demands it.   3. I will not be in the hospital this wekend. Please contact intake nurse at [phone] to coordinate transfer.  Electronic Signatures: Kristine LineaPucilowska, Zelia Yzaguirre (MD)  (Signed 17-Jan-14 12:24)  Authored: Brief Consult Note   Last Updated: 17-Jan-14 12:24 by Kristine LineaPucilowska, Venice Marcucci (MD)

## 2015-03-13 NOTE — Consult Note (Signed)
Details:    - Psychiatry: Patient continues to be very depressed. Anxious. Hopeless. Still appropriate for inpatient psychiatric treatment still under a valid involuntary commitment at this point. He is still needing to get IV glucose. Once he is medically stable and no longer needing intravenous treatment we will still be glad to transfer him to psychiatry. I will be away from the hospital tomorrow, Thursday . Please contact the psychiatry admissions nurse who will contact a psychiatrist to do the admission. I will be happy to follow the patient 0nce I return on Friday.   Electronic Signatures: Audery Amellapacs, John T (MD)  (Signed 08-Jan-14 20:16)  Authored: Details   Last Updated: 08-Jan-14 20:16 by Audery Amellapacs, John T (MD)

## 2015-03-13 NOTE — Consult Note (Signed)
Details:   - Psychiatry: Follow up. Patient continues to be medically stable. He says he has no SI but his recent behavior make his testimony unreliable. Custody order still in effeect but will expire in between 1-2 days. Tomorrow I'll make sure it is renewed. [facility] and input from risk management and hosp atty. We are still in discussion with [facility] about transfer. IF staying on medical service will help facilitate faster transfer we would prefer he stay on medical for now. We are checking this daily. Will follow.   Electronic Signatures: Audery Amellapacs, Carolan Avedisian T (MD)  (Signed 07-Apr-14 17:29)  Authored: Details   Last Updated: 07-Apr-14 17:29 by Audery Amellapacs, Bryan Omura T (MD)

## 2015-03-13 NOTE — Discharge Summary (Signed)
PATIENT NAME:  [name], [name] MR#:  [id number] DATE OF BIRTH:  1976/11/19  DATE OF ADMISSION:  01/11/2013 DATE OF DISCHARGE:  01/15/2013  DISCHARGE DIAGNOSES:  1. Insulin overdose with intent to harm. 2. Suicide attempt. 3. Alcohol and tobacco abuse.  4. Hypertension.  5. Depression.  6. Hypoglycemia secondary to insulin overdose, now resolved.   SECONDARY DIAGNOSIS:  1. Gastroesophageal reflux disease.   CONSULTATIONS: Psychiatry, Dr. Jennet MaduroPucilowska.   HISTORY AND SHORT HOSPITAL COURSE: The patient is a 39 year old male with known history of recurrent admission for insulin overdose, who was admitted again on the 21st of February with a suicide intent and insulin overdose, was significantly hypoglycemic. Please see Dr. Serita GritShreyang Patel's dictated history and physical. The patient was involuntarily committed and was evaluated by psychiatry. The patient was started on D10, followed by D5 and was tapered off to take him off completely IV fluid as he pulled out IV fluid and would not allow another IV to be placed back. He tolerated oral diet fine and maintained his sugars fairly well between 80 to 100 and was discharged to Inpatient Behavioral Medicine as the patient was medically clear and was accepted by Dr. Jennet MaduroPucilowska.   PERTINENT PHYSICAL EXAMINATION ON THE DATE OF DISCHARGE: VITAL SIGNS:  his vital signs are as follows: Temperature 98, heart rate 75 per minute, respirations 16 per minute, blood pressure 132/86 mmHg. He was saturating 97% on room air.  CARDIOVASCULAR: S1 and S2 normal. No murmurs, rubs or gallop.  LUNGS: Clear to auscultation bilaterally. No wheezing, rales, rhonchi or crepitation.  ABDOMEN: Soft, benign.  NEUROLOGIC: Nonfocal examination.  All other physical examination remained at baseline.   DISCHARGE MEDICATIONS: Prilosec OTC 20 mg p.o. daily.   DISCHARGE DIET: Low sodium, low fat, low cholesterol, 1800 ADA.   DISCHARGE ACTIVITY: As tolerated.   DISCHARGE INSTRUCTIONS  AND FOLLOWUP: The patient was instructed to follow up with [facility] in 2 to 3 days after discharge from psychiatry.   TOTAL TIME DISCHARGING THIS PATIENT: 55 minutes.    ____________________________ Ellamae SiaVipul S. Sherryll BurgerShah, MD vss:OSi D: 01/23/2013 09:06:54 ET T: 01/23/2013 09:19:46 ET JOB#: [id number]  cc: Vipul S. Sherryll BurgerShah, MD, <Dictator> Dione HousekeeperMario Ernesto Olmedo, MD Jolanta B. Jennet MaduroPucilowska, MD Patricia PesaVIPUL S SHAH MD ELECTRONICALLY SIGNED 01/24/2013 15:08

## 2015-03-13 NOTE — Discharge Summary (Signed)
PATIENT NAME:  [name], [name] MR#:  [id number] DATE OF BIRTH:  1976/10/09  DATE OF ADMISSION:  02/19/2013 DATE OF DISCHARGE: 03/14/2013    HOSPITAL COURSE: See dictated history and physical for details of admission. This 39 year old man presented to the Emergency Room after having overdosed himself with a very large amount, allegedly 1000 units, of insulin and also taken a very large intentional overdose of lithium. The patient did this very shortly after returning home from being discharged from a lengthy stay on psychiatry. He got home and immediately started back to drinking. When he first came into the hospital this time, he continued to endorse feeling depressed, feeling sad, wanting to die, having constant suicidal thoughts. That went on for about 2 days before he cleared up. In the time since then, he has consistently denied any suicidal ideation at all. He told me initially that when he went home, he started drinking, saw some needles around the house for insulin and just could not help himself but to try to kill himself again. The patient clearly was intending this as a suicide attempt, but at the same time, called EMS on himself to have them come pick him up. This has been a recurrent behavior with him. He has done this multiple times, possibly as many as 8 or 9 all in a row. This time, as mentioned, he did it within only a few hours of being discharged from psychiatry. He had been on the psychiatric ward for over 20 days and was discharged in a condition of euthymic mood, smiling, totally denying suicidal ideation, denying any psychotic symptoms, appearing lucid and optimistic, and yet this happened again. In his time here in the hospital, he was monitored for several days, and his lithium level was checked regularly until it came down into the normal and then zero range. He did not require dialysis. The patient does not actually have diabetes anymore and does not require maintenance insulin  treatment. For the rest of his hospital stay, the patient has remained on suicide precautions with a daily sitter. He has frequently been evaluated and has consistently denied suicidal ideation. His affect is smiling, upbeat, calm, not psychotic. Totally denies suicidal ideation, pledges that he will never do anything like this again. I would note that this mental status he has now is exactly the same as the one he had when he was discharged last time, and this time we have not been giving him all of the psychiatric medicines that he got last time. My conclusion would be that they really play little role in making a difference to his baseline mental health. Staff have met with the hospital risk management and attorneys. The patient's recurrent behavior is not responding to any treatment we have to offer at our facility. He appears to continue to be at high risk for this behavior. At this point, he is being discharged to [facility], hopefully for longer-term treatment of what appears to be a chronic personality disorder with mood instability,   MEDICATIONS AT DISCHARGE: Norvasc 10 mg per day, Vasotec 20 mg per day, Prozac 40 mg per day, gabapentin 300 mg 3 times a day, Prilosec 20 mg in the morning, Seroquel 100 mg at night and 100 mg 4 times a day, Zantac 150 mg twice a day.   LABORATORY RESULTS: Multiple labs have been drawn throughout his hospital stay. On presentation, he had a glucose initially at 199, reflecting the sugar bolus he had been given on transfer  into the hospital. Potassium low at 3.4. Drug screen was negative. Lithium level was 2.35. His lithium level peaked at 2.86, and his lithium then continued to drop from then on. Most recent chemistries are a hemoglobin A1c at 4.8, which is completely normal without treatment for diabetes. Creatinine is now 0.83, reflecting no sign of permanent kidney damage at this point. Platelet count normal at 281.   MENTAL STATUS EXAM AT DISCHARGE:  Reasonably well groomed man who looks his stated age, cooperative with the interview. Eye contact good. Psychomotor activity normal. Speech normal in rate, tone and volume. Affect euthymic, reactive, appropriate. Mood stated as being okay. He expresses optimism and is enthusiastic about the transfer. No sign of loosening of associations or delusions. Not reporting suicidal or homicidal ideation, not reporting acute psychotic symptoms. Judgment and insight chronically impaired. Intelligence low average.   DISPOSITION: Transfer under renewed petitioned for IVC to [facility].   DIAGNOSIS, PRINCIPAL AND PRIMARY:  AXIS I: Will continue to say bipolar disorder, not otherwise specified.   SECONDARY DIAGNOSES:  AXIS I: Alcohol dependence.  AXIS II: Borderline personality disorder.  AXIS III: Hypertension, status post lithium overdose and toxicity without permanent damage.  AXIS IV: Moderate chronic stress from burden of illness.  AXIS V: Functioning at time of transfer 42.  ____________________________ Gonzella Lex, MD jtc:OSi D: 03/14/2013 11:19:17 ET T: 03/14/2013 11:29:07 ET JOB#: [id number]  cc: Gonzella Lex, MD, <Dictator> Gonzella Lex MD ELECTRONICALLY SIGNED 03/14/2013 15:09

## 2015-03-13 NOTE — Discharge Summary (Signed)
PATIENT NAME:  [name], [name] DATE OF BIRTH:  07-Sep-1976  DATE OF ADMISSION:  12/03/2012 DATE OF DISCHARGE:  12/07/2012  ADMITTING DIAGNOSES: Insulin overdose in suicidal attempt secondary to depression, insulin-dependent diabetes mellitus, systemic hypertension, hypoglycemia, alcohol and tobacco abuse, multiple admissions with suicidal attempts.  DISCHARGE DIAGNOSES:  1.  Overdose with insulin with intent to harm himself in suicidal attempt, repeated insulin overdose while in the hospital.  2.  Alcohol and tobacco abuse.  3.  History of hypertension, depression, insulin-dependent diabetes mellitus.  4.  Mood disorder, not otherwise specified. 5.  Alcohol dependence. 6.  Cocaine abuse.  7.  Benzodiazepine dependence. 8.  Personality disorder, not otherwise specified.  9.  History of gastroesophageal reflux disease, alcoholic hepatitis, pseudoseizures, mental illness, substance abuse, primary support and family conflict.   DISCHARGE CONDITION: Stable.   DISCHARGE MEDICATIONS: The patient is to continue sliding scale insulin. The patient's glucose levels should be checked every 4 hours until 12/08/2012, then changed to before meals and before bedtime. Insulin 70/30 with 32 unit subcutaneously twice daily dose should be restarted when the patient is mentally stable and can take his medications faithfully. Other medications:  Nexium 40 mg p.o. daily, Norvasc 10 mg p.o. daily, folic acid 1 mg p.o. daily, thiamine 100 mg p.o. daily, enalapril 30 mg p.o. daily, carbamazepine 200 mg p.o. twice daily and nicotine transdermal patch 21 mg transdermally daily.   HOME OXYGEN: None.   DIET: Two gram salt, low fat, low cholesterol, carbohydrate-controlled diet, regular consistency.   ACTIVITY LIMITATIONS: As tolerated.   FOLLOWUP APPOINTMENTS: With Dr. Zada Finderslmedo in 2 days after discharge.   CONSULTANTS: Dr. Jennet MaduroPucilowska, care management.   RADIOLOGIC STUDIES: Chest portable single view,  12/02/2012, showed no acute disease of chest. Ultrasound of abdomen limited survey, 12/07/2012, revealed negative exam. Gallbladder was found to be normal. Negative Murphy's sign. Gallbladder wall thickness was normal. Common bile duct caliber was 3.6 mm.   HISTORY OF PRESENT ILLNESS AND HOSPITAL COURSE: The patient is a 39 year old Caucasian male with past medical history significant for history of mood disorder, suicidal attempts with insulin in the past, who presented to the hospital with complaints of insulin overdose. Evidently, the patient was admitted to the hospital on the 7th and discharged on 11/29/2012 after drug overdose with insulin in suicidal attempt. He had psychiatry evaluation at this time. This time, he took 1200 units of NovoLog insulin in the morning of the day of admission. Then he notified his roommate that he did it. His roommate called EMS and the patient was transported to the hospital. The patient was noted to be hypoglycemic and required dextrose 10% solution infusion to maintain his blood glucose levels. The lowest blood glucose levels were noted as 29. He was admitted to the hospital for further evaluation.   His initial labs on 12/02/2012 showed a potassium level of 3.2. Otherwise, BMP was unremarkable. The patient's lipase level was normal at 218. Alcohol level was 77. Liver enzymes revealed mild elevation of AST to 43. Urine drug screen was negative. CBC: White blood cell count 4.7, hemoglobin 14.9, platelet count 205. Absolute neutrophil count was normal at 2.7. Urinalysis was unremarkable. The patient's acetaminophen level was less than 2. Salicylate level was 2.6. EKG showed normal sinus rhythm at 99 beats per minute; minimal voltage criteria for LVH, may be normal variant; septal infarct, age undetermined according to EKG criteria; no significant change since prior EKG and nonspecific changes in anterior leads were noted. Chest  x-ray was unremarkable.   The patient was  admitted to the hospital for D10/water solution and it was continued until his blood glucose levels were stable. He was with a Comptroller and psychiatrists were following the patient along. The patient, however, had an episode in the hospital on 12/04/2012 when he again injected a NovoLog pen, potentially 300 units. Apparently he found his NovoLog pen in his pants and injected, and he told the physician that he just wanted to leave this world. He was again started on D10 solution and continued until his glucose levels were stable. By 12/07/2012, the patient's glucose levels were around 140. The patient's D10 was discontinued. The patient is eating well, consuming a regular diet. He is being transferred to [facility] Unit for further recommendations and treatment of his psychiatric disease. Psychiatric unit was advised to continue with glucose level checks every 4 hours until 12/08/2012 and then, if they are stable, sliding scale insulin as well as possibly insulin 70/30 should be restarted and blood glucose levels should be checked before meals and at bedtime. Dr. Jennet Maduro saw the patient in consultation on 12/07/2012. She will be admitting the patient to her service. She noted that patient did not require CIWA scale. He is addicted to benzodiazepines as well as alcohol; however, she did not recommend giving patient Ativan. She told us that she would plan to send him to [facility] facility whenever able. The patient is stable today on 12/07/2012. He is being discharged to [facility] Unit for further recommendations and treatment of his psychiatric disease.   On the day of discharge, his vitals are temperature 98.8, pulse 86, respiration rate 18, blood pressure 114/75, saturation 98% on room air at rest.   TIME SPENT: 40 minutes.  ____________________________ Katharina Caper, MD rv:jm D: 12/07/2012 18:32:00 ET T: 12/08/2012 20:49:01 ET JOB#: [id number]  cc: Katharina Caper, MD, <Dictator> Dione Housekeeper, MD Rosealie Reach MD ELECTRONICALLY SIGNED 12/16/2012 21:38

## 2015-03-13 NOTE — Consult Note (Signed)
Details:   - Psychiatry: This is a followup on this gentleman with a history of multiple episodes of suicide attempts by insulin overdose. Today I found him to be cleaned up and neat and coherent. He told me that his mood is feeling much better. He told me he absolutely promises that he would never ever try to kill himself again. He said that he is over with that and can reassure me that he would never do it in the future. He says that he wants to go home tomorrow. Reviewing the events that brought him into the hospital the patient tells me that when he went home from his last hospital stay he found a couple of insulin needles around the house. When his girlfriend went out to the store he just couldn't help himself and "felt depressed" so he tried to kill himself. He leaves out the part of the story where he got drunk. I reminded him that he actually was showing a very bright and happy affect and was promising us that he would never try to kill himself on the very day that he left the hospital last time and asked him to explain what was different now. He feels like the explanation that now he really promises that he won't kill himself is adequate.  There is a scheduled meeting for tomorrow with psychiatry clinical team and legal representatives to discuss the appropriate plan for the patient. I told him that he probably should not get his hopes up about being discharged tomorrow appeared her continuing to ask for him to stay on medicine until we have a clearer plan which should be by tomorrow.   Electronic Signatures: Audery Amellapacs, Vala Raffo T (MD)  (Signed 06-Apr-14 22:33)  Authored: Details   Last Updated: 06-Apr-14 22:33 by Audery Amellapacs, Nelline Lio T (MD)

## 2015-03-13 NOTE — Consult Note (Signed)
Brief Consult Note: Diagnosis: Mood disorder NOS, Borderline personality disorder, Alcohol dependence.   Patient was seen by consultant.   Consult note dictated.   Recommend further assessment or treatment.   Orders entered.   Discussed with Attending MD.   Comments: [name] has a long h/o mental illness, substance abuse and multiple suicide attempts. [facility] after yet another overdose. He is no longer suicidal or homicidal. He is forward thinking and optimistic about the future.   PLAN: 1. The patient does not meet criteria for IVC. Please discharge as appropriate.   2. D/C sitter.  3. The patient is to continue Seroquel 200 mg at night only for mood stabilization.     3. I will d/c detox protocol and high dose seroquel.   4. [facility] ACT team with Dr. Omelia [name] will meet with the patient tomorrow at his house as planned.   5.  Case discussed with the attending and [name], CSW.Marland Kitchen.  Electronic Signatures: Kristine LineaPucilowska, Clela Hagadorn (MD)  (Signed 27-Nov-14 14:13)  Authored: Brief Consult Note   Last Updated: 27-Nov-14 14:13 by Kristine LineaPucilowska, Carold Eisner (MD)

## 2015-03-13 NOTE — Consult Note (Signed)
PATIENT NAME:  [name], [name] MR#:  [id number] DATE OF BIRTH:  Mar 24, 1976  DATE OF CONSULTATION:  09/10/2013  REFERRING PHYSICIAN:  Humberto LeepJohn H. Margarita GrizzleWoodruff, MD CONSULTING PHYSICIAN:  Shane FillersUzma S. Garnetta BuddyFaheem, MD  REASON FOR CONSULT: "I told them I took an OD of insulin, but I didn't really."  HISTORY OF PRESENT ILLNESS: The patient is a 39 year old male with recurrent admissions to the ED after taking an overdose of insulin, presented to the ER after he reported that he took an overdose of insulin. He reported that he took the overdose of insulin, which belonged to his father, around 12 noon. His last overdose of insulin happened around September 17 when he was discharged to [facility] and he stayed there for 17 days. He reported that he returned home on October 8. He lives 3 trailers down from his father. He reported that he sees him daily. However, he has been out of food or drinks for the past couple of days. He reported that he drank half of a 40-ounce beer yesterday. The patient reported that he has been feeling very depressed lately and he decided that he wanted to hurt himself. He became impulsive and then he consumed alcohol. He reported that he used insulin, as he was trying to hurt himself. He also stated that he has not used any of his medications since he did not have any money to buy his pills. The patient reported that he gets disability checks, but all the money is spent on his life bills. The patient was noted to be eating crackers with peanut butter during the interview, as he felt very hungry. He was also asking the staff for more juices and sodas. The patient did not appear to understand the gravity of the situation. He reported that he never followed up with the outpatient appointment with [facility]. He has an upcoming appointment coming up on November 4. The patient reported feeling depressed, hopeless, helpless, as well as having social isolation. He reported that he does not use any other drugs besides  alcohol.   PAST PSYCHIATRIC HISTORY: The patient has long history of mental illness with multiple suicide attempts by using insulin. He has been sent for multiple drug rehab and for suicide attempts. He has been tried on several psychotropic medications in the past including Prozac, Tegretol, lithium, Valium, Zoloft, but remains noncompliant.   FAMILY PSYCHIATRIC HISTORY: None reported.   PAST MEDICAL HISTORY: History of pseudoseizures, GERD, hypertension.   ALLERGIES: ASPIRIN, CODEINE, MORPHINE, PENICILLIN AND TRAMADOL.   CURRENT MEDICATIONS: He stated that he is noncompliant with his current psychotropic medications.   SUBSTANCE ABUSE HISTORY: The patient has been using alcohol and stated that his last use was yesterday. He consumed approximately half of 40 ounces of beer. He stated history of seizures, but no history of blackouts. He has recently completed the [facility] program.   SOCIAL HISTORY: The patient is currently disabled. He currently lives alone in a trailer with his 3 dogs. He was in a relationship of 10 years, but his girlfriend left him. His father lives close by. He stated that he does not have any other relationships.   REVIEW OF SYSTEMS:   CONSTITUTIONAL: No fever or chills. No weight changes.  EYES: No double or blurred vision.  RESPIRATORY: No shortness of breath or cough.  CARDIOVASCULAR: No chest pain or orthopnea.  GASTROINTESTINAL: Denies any abdominal pain, nausea, vomiting, diarrhea.  GENITOURINARY: No urgency or frequency.  ENDOCRINE: No heat or cold intolerance.  LYMPHATIC: No anemia  or easy bruising.  INTEGUMENTARY: No acne or rash.  LABORATORY DATA: Blood glucose is low at 42, BUN 83, creatinine 0.95, sodium 137, potassium 3.0, chloride 104, bicarbonate 21, anion gap 12, osmolality 270, calcium 9.4. Blood alcohol level 64. Protein 7.9, albumin 4.1, bilirubin 0.5, AST 38, ALT 43. TSH 0.81. Tricyclic antidepressants positive in the UDS. WBC 6.2, hemoglobin 15.7,  hematocrit 46, MCV 85.   MENTAL STATUS EXAMINATION: The patient is a moderately built male who was sitting in the bed and was eating constantly crackers and peanut butter. He was also noted to be drinking. He maintains limited eye contact. His speech was rapid. Mood was depressed with flat affect. Thought process tangential. Thought content was nondelusional. He just attempted suicide by overdosing on the insulin. He demonstrated poor insight and judgment.   DIAGNOSTIC IMPRESSION: AXIS I: Bipolar disorder, most recent episode; mixed alcohol dependence.  AXIS II: Borderline personality disorder.  AXIS III: Obesity, hypertension, gastroesophageal reflux disease.  AXIS IV: Severe.   TREATMENT PLAN: The patient is currently on involuntary commitment and case discussed with the ED physician, and he is planning to admit the patient medically because of his low blood sugar and since he has recently attempted suicide by overdosing on the insulin. The patient will be monitored by psychiatry as well during his admission. Once the patient becomes clinically stable, we will decide about transfer to the psychiatric facility or not.   Thank you for allowing me to participate in the care of this patient.   ____________________________ Shane Fillers. Garnetta Buddy, MD usf:jm [name]: 09/10/2013 16:07:42 ET T: 09/10/2013 16:59:21 ET JOB#: [id number]  cc: Shane Fillers. Garnetta Buddy, MD, <Dictator> Rhunette Croft MD ELECTRONICALLY SIGNED 09/16/2013 14:44

## 2015-03-13 NOTE — Consult Note (Signed)
Brief Consult Note: Diagnosis: major depression severe/alcohol dependence.   Patient was seen by consultant.   Consult note dictated.   Recommend further assessment or treatment.   Comments: Psychiatry: Patient seen. Chart reviewed. Man with multiple serious suicide attempts and this is another one. Needs transfer to psych once medically stable.  Electronic Signatures: Audery Amellapacs, Madilyn Cephas T (MD)  (Signed 07-Jan-14 19:12)  Authored: Brief Consult Note   Last Updated: 07-Jan-14 19:12 by Audery Amellapacs, Jaymian Bogart T (MD)

## 2015-03-13 NOTE — Discharge Summary (Signed)
PATIENT NAME:  [name], [name] MR#:  [id number] DATE OF BIRTH:  01/03/1976  DATE OF ADMISSION:  01/15/2013 DATE OF DISCHARGE:  01/17/2013  DISCHARGE DIAGNOSES:  1.  Insulin overdose with intent to harm. Suicide attempt.  2.  Alcohol and tobacco abuse.  3.  Hypertension.  4.  Depression.  5.  Hypoglycemia, now resolved.   SECONDARY DIAGNOSES:  1.  Recurrent admissions for insulin overdose and hypoglycemia. 2.  Hypertension. 3.  Alcohol and tobacco abuse.  4.  Gastroesophageal reflux disease.   CONSULTATIONS: Psychiatry, Dr. Pucilowska.   PROCEDURES AND RADIOLOGY: None.   MAJOR LABORATORY PANEL: Urinalysis on admission was negative. Serum acetaminophen and salicylate levels were within normal limits.   HISTORY AND SHORT HOSPITAL COURSE: The patient is a 39-year-old male with the above-mentioned medical problems who was admitted for insulin overdose along with possible Tegretol overdose. Please see Dr. Shreyang [name]'s dictated history and physical for further details. Per record, he took 1500 units of insulin Lantus and 20 tablets of Tegretol. The patient was monitored closely in the ICU. As he had significant hypoglycemia, he was placed on D5 normal saline and was hydrated aggressively with every 1 hour sugar check. He pulled out his IV on February 23 and was monitored just with oral intake. As he was waking up and was taking good oral intake, his sugar maintained fairly well between 80s to 120s. He was medically clear for inpatient psychiatric transfer. Dr. Pucilowska from psychiatry evaluated the patient and accepted the patient to inpatient behavioral unit and he was discharged in stable condition.   PHYSICAL EXAMINATION:  VITAL SIGNS: On the date of discharge, his temperature was 98, heart rate 60 per minute, respirations 18 per minute, blood pressure 132/86 mmHg. He was saturating 97% on room air. His blood sugar ranged from the 80s to 130s.  CARDIOVASCULAR: S1, S2 normal. No murmurs,  rubs or gallop.  LUNGS: Clear to auscultation bilaterally. No wheezing, rales, rhonchi or crepitation.  ABDOMEN: Soft, benign.  NEUROLOGIC: Nonfocal examination. All other physical examination remained at baseline.   DISCHARGE MEDICATIONS: Prilosec 20 mg p.o. daily.   DISCHARGE DIET: Low sodium, low fat, low cholesterol, 1800 ADA.   DISCHARGE ACTIVITY: As tolerated.   DISCHARGE INSTRUCTIONS AND FOLLOWUP: The patient was instructed to follow up with his primary care physician, Dr. Olmedo, at [facility] in 2 to 3 days after discharge.   TIME SPENT: Total time discharging this patient was 55 minutes.   ____________________________ [name] S. Shane Tippetts, MD vss:jm D: 01/17/2013 14:11:50 ET T: 01/17/2013 17:32:00 ET JOB#: [id number]  cc: Jaxtyn Linville S. Malkia Nippert, MD, <Dictator> Mario Ernesto Olmedo, MD Shane B. Pucilowska, MD Kayin Kettering S Dontavian Marchi MD ELECTRONICALLY SIGNED 01/19/2013 15:27 

## 2015-03-13 NOTE — H&P (Signed)
PATIENT NAME:  [name], TURNBOUGH MR#:  [id number] DATE OF BIRTH:  Sep 25, 1976  DATE OF ADMISSION:  08/03/2013  CHIEF COMPLAINT: Lantus overdose, hypoglycemia.   HISTORY OF PRESENT ILLNESS: A 39 year old male patient with prior history of suicidal attempts, depression, overdose on insulin, presents to the Emergency Room, brought in by EMS after the patient's Dad called that patient overdosed on about 1500 units of Lantus and 300 units of NovoLog. The patient has done this in the past. He took his extra insulin after he broke up with his girlfriend to kill himself. Then he told his Dad that he overdosed, who called EMS and then brought to the Emergency Room. This has been the case several times in the past. The patient was sent to [facility] psychiatric hospital in the past. He mentions that they will not take him again.   He does not have a primary care physician, gets his insulin from Urgent Care. He has insulin-dependent diabetes mellitus, takes 20 to 25 units of Lantus on regular days.   PAST MEDICAL HISTORY: 1. Insulin-dependent diabetes mellitus.  2. Hypertension.  3. Alcohol abuse.  4. Tobacco abuse.  5. GERD.  6. Suicidal attempts.  7. Bipolar affective disorder.  8. Severe depression.   PAST SURGICAL HISTORY: None.   ALLERGIES: ASPIRIN, CODEINE, MOTRIN, PENICILLIN, TRAMADOL, which cause GI distress.   PSYCHOSOCIAL HISTORY:  The patient lives alone. He smokes a pack a day. Drinks alcohol every day although he cannot quantify how much.   CODE STATUS:  FULL CODE.   FAMILY HISTORY:  Diabetes in multiple family members.   HOME MEDICATIONS INCLUDE: 1. Acetaminophen/oxycodone 325/5, 1 tablet every 8 hours as needed.  2. Amlodipine 10 mg daily. 3. Clonazepam 0.5 mg 2 times a day as needed.  4. Enalapril 25 mg daily.  5. Metformin 500 mg oral 2 times a day.  6. Neurontin 400 mg oral 3 times a day.  7. NovoLog Flex Pen 100 units 20 units subcutaneous once a day.  8. Prilosec  over-the-counter 20 mg oral once a day.  9. Prozac 20 mg oral once a day. 10. Seroquel 400 mg at bedtime, and 50 mg 3 times a day.   REVIEW OF SYSTEMS:    CONSTITUTIONAL: Complains of fatigue, weakness, tiredness.  EYES: No blurred vision, pain or redness.  ENT: No tinnitus, ear pain, hearing loss.  RESPIRATORY: Has cough, wheeze, hemoptysis.  CARDIOVASCULAR: No chest pain, orthopnea, edema.  GASTROINTESTINAL: No nausea, vomiting, diarrhea, abdominal pain.  GENITOURINARY: No dysuria, hematuria, frequency.  ENDOCRINE: No polyuria, nocturia, thyroid problems.  HEMATOLOGIC/LYMPHATIC: No anemia, easy bruising, bleeding.  INTEGUMENTARY: No acne, rash, lesions.  MUSCULOSKELETAL: No back pain, arthritis.  NEUROLOGIC: No focal numbness, weakness, dysarthria.  PSYCHIATRIC: Has anxiety and severe depression, suicide attempt.   PHYSICAL EXAMINATION: VITAL SIGNS: Temperature 97.9, pulse of 130, blood pressure 165/101, saturating 97% on room air.  GENERAL: Obese Caucasian male patient lying in bed, comfortable, conversational, cooperative with exam.  PSYCHIATRIC:  He is alert, oriented x 3. Mood and affect appropriate. Judgment intact.  HEENT: Atraumatic, normocephalic. Oral mucosa moist and pink. External ears and nose normal. No pallor or icterus. His pupils equal and react to light.  NECK: Supple. No thyromegaly. No palpable lymph nodes. Trachea midline. No carotid bruit, JVD.  CARDIOVASCULAR: S1, S2, tachycardic, without any murmurs.  RESPIRATORY:  Normal work of breathing. Clear to auscultation on both sides. GASTROINTESTINAL:  Soft abdomen, nontender. Bowel sounds present. No hepatosplenomegaly palpable. SKIN:  Has multiple scratches  from his kittens on his hands. No infection.  NEUROLOGIC: Motor strength 5/5 in upper and lower extremities.   LAB STUDIES: Show glucose of 41, BUN 4, creatinine 0.73, sodium 134, potassium 3.5. Alcohol 0.2. AST, ALT, alkaline phosphatase, bilirubin normal. Urine  drug screen negative for any opioids or benzodiazepines. WBC 12.4, hemoglobin 17.1, platelets of 325.   EKG shows sinus tachycardia.   ASSESSMENT AND PLAN: 1. Insulin overdose on both long-acting and short-acting. The patient has received a total of 4 amps of D50 at this time, in spite of which he is hypoglycemic into the 50s. Will put him on a regular diet. Accu-Cheks q. 1 hour. Admit to CCU on D10. If blood sugars are still low, he might need D20.The patient is critically ill. He is tachycardic, likely secondary from the hypoglycemia. Needs close monitoring. High risk for morbidity/mortality. I have discussed with the patient regarding the psychiatric consult. He does seem to have suicidal ideation after breaking up with his girlfriend. Unfortunately, patient needs insulin for his diabetes, and there is no way we can stop this, although oral hypoglycemics would be an option, he can still overdose on these medications, too.  2.  Sinus tachycardia secondary to hypoglycemia.  3.  Hypertension. Continue home medications.  4.  Deep venous thrombosis prophylaxis with Lovenox.  5. Suicide attempt. Consult Psychiatry. IVC being done in the Emergency Room. He will be on suicide precautions.  6.  Code status:  FULL CODE.    Total time spent on this case was 45 minutes of critical care time.      ____________________________ Molinda BailiffSrikar R. Kylani Wires, MD srs:mr D: 08/03/2013 20:38:06 ET T: 08/03/2013 21:20:44 ET JOB#: [id number]  cc: Wardell HeathSrikar R. Twilia Yaklin, MD, <Dictator> Orie FishermanSRIKAR R Sora Olivo MD ELECTRONICALLY SIGNED 08/04/2013 14:27

## 2015-03-13 NOTE — Consult Note (Signed)
Brief Consult Note: Diagnosis: Borderline personality disorder, Mood disorder NOS, Alcohol dependence.   Patient was seen by consultant.   Consult note dictated.   Recommend further assessment or treatment.   Discussed with Attending MD.   Comments: [name] returns to [facility] in just few days following discharge after another suicide attempt. His GF left him due to drinking, about 12-18 beers a day. BAL none yesterday but it was 1.2 on 9/13 at previous admission.   PLAN: 1. The patient is on IVC, Corporate investment bankerContinue sitter.   2. He agreed to alcohol rehab at [facility].   3. Please continue CIWA.  4. Psychiatry will follow..  Electronic Signatures: Kristine LineaPucilowska, Joycelin Radloff (MD)  (Signed 18-Sep-14 13:19)  Authored: Brief Consult Note   Last Updated: 18-Sep-14 13:19 by Kristine LineaPucilowska, Katara Griner (MD)

## 2015-03-13 NOTE — Op Note (Signed)
PATIENT NAME:  [name], [name] MR#:  [id number] DATE OF BIRTH:  [birth year]  DATE OF PROCEDURE:  02/20/2013  PREOPERATIVE DIAGNOSIS:  Insulin overdose with lack of IV access.  POSTOPERATIVE DIAGNOSIS:  Insulin overdose with lack of IV access.   OPERATION:  Central venous catheter insertion under ultrasound guidance.   SURGEON:  Quentin Orealph L. Ely, M.[name].   ANESTHESIA:  Local.   OPERATIVE PROCEDURE:  With the patient in supine position his right neck was interrogated with the ultrasound.  A large jugular vein identified.  The patient's neck was prepped with betadine, draped in sterile towels.  1% Xylocaine was injected over the area identified by the ultrasound.  Sterile ultrasound probe was then used to interrogate the neck and under direct vision the right internal jugular vein was cannulated on a single pass.  A wire was passed into the great vessel system without difficulty.  The skin incision was enlarged and a dilator placed over the wire.  Triple-lumen catheter was then placed over the wire after the dilator was removed.  The wire was removed.  The catheter was secured in place with 3-0 silk, appropriately flushed and dressed.  The patient was then prepared for a chest x-ray for line placement.     ____________________________ Carmie Endalph L. Ely III, MD rle:ea [name]: 02/20/2013 01:12:56 ET T: 02/20/2013 06:07:31 ET JOB#: [id number]  cc: Quentin Orealph L. Ely III, MD, <Dictator> Quentin OreALPH L ELY MD ELECTRONICALLY SIGNED 02/20/2013 20:32

## 2015-03-13 NOTE — Discharge Summary (Signed)
PATIENT NAME:  [name], Aceton D MR#:  [id number] DATE OF BIRTH:  [birth year]  DATE OF ADMISSION:  09/22/2013 DATE OF DISCHARGE:  09/23/2013  DISCHARGE DIAGNOSES:  1.  Insulin overdose, suicidal attempt.  2.  NyQuil overdose.  3.  Alcoholism.  4.  Smoking.  5.  Depression.   CONDITION ON DISCHARGE: Stable.   CODE STATUS: Full code.   MEDICATIONS ON DISCHARGE: 1.  Amlodipine 10 mg oral tablet once a day.  2.  Clonazepam 1 mg oral tablet 3 times a day.  3.  Nexium 40 mg delayed-release capsule once a day.  DIET ON DISCHARGE: Low-sodium diet. Consistency: Regular.   ACTIVITY: As tolerated.   TIMEFRAME TO FOLLOWUP: Within 1 to 2 weeks with PMD, Dr. Rolin BarryMario Olmedo.   HISTORY OF PRESENT ILLNESS: On 2nd of November, this 39 year old male with medical history of depression and multiple episodes of drug overdose due to suicidal tendency with insulin overdose, was last discharged the previous day and had some argument with his girlfriend and took overdose of NyQuil with alcohol and also took Lantus overdose and came to the Emergency Room. In ER, his blood sugar level was found to be normal. They monitored for 3 to 4 hours. His acetaminophen level was slightly elevated and because of his overdose tendency and suicidal attempt, he was placed on involuntary commitment and [facility]. As all the labs were within acceptable range but suicidal tendency and overdose, they suggested to monitor him for 24 hours and so he was admitted to medical services.   HOSPITAL COURSE: For his Lantus overdose and NyQuil overdose, he was monitored on medical service. Blood sugar was check every hourly and then every 2 hourly and then later on every 4 hourly, remained stable without any drop and remained normal. His acetaminophen level was slightly elevated on admission, but there was no liver damage noticed and on further followup after 6 hours, it came down and remained negative for rest of  the hospital course. Psychiatry consult was called in for his suicidal attempt and they suggested some changes in his medication and advised to discharge him without any other changes. Spoke to the Child psychotherapistsocial worker about the admission issues and they had Adult Protective Services already set up to go to his home and follow him, and so we were able to discharge him without any further intervention for his suicidal tendency.   Alcoholism: He was monitored on CIWA protocol, but there were no tremors.   Smoking: Smoking cessation counseling done for 5 minutes and offered nicotine patch to him.  CONSULT IN THE HOSPITAL: Psychiatry consult with Dr. Jennet MaduroPucilowska.  IMPORTANT LABORATORY RESULTS IN THE HOSPITAL: Glucose was 98, creatinine 0.87, sodium 135, potassium 3.7. WBC 7.8, hemoglobin 14.6 and platelet count 452. Salicylate level was less than 1.7. Acetaminophen level was 28 on admission. TSH was 1.68. Troponin was less than 0.02. ABG: The pH was 7.32; pCO2 was 41 and pO2 was 81 on room air on admission. Urinalysis was grossly negative. Urine for toxicology was negative for all routine drug screen. Acetaminophen level came down to 16 after a few hours of check-up, and in the evening the same date came down less than 2. His blood sugar level remained stable between 100 and 150 in the hospital on frequent check-ups.   TOTAL TIME SPENT IN THIS DISCHARGE: 40 minutes.   ____________________________ Hope PigeonVaibhavkumar G. Elisabeth PigeonVachhani, MD vgv:jcm D: 09/27/2013 13:32:47 ET T: 09/27/2013 14:17:46 ET JOB#: [id number]  cc: Heath GoldVaibhavkumar  Arrie Eastern, MD, <Dictator> Dione Housekeeper, MD Heath Gold [facility] MD ELECTRONICALLY SIGNED 10/07/2013 8:10

## 2015-03-13 NOTE — Consult Note (Signed)
Psychiatry: Patient seen. PAtient with severe personality disorder/alcohol dependence/repeated serious suicide attempts with no reliability to be safe outside hospital. PAtient is irritated but not psychotic. Not violent but ill about still being here at 15 days.Supportive therapy and review of rationale for his presence here done. Will nincrease seroquel to 100mg  qid and 200mg  at night for agitation. Patient wants more benzodiazepines but I am hesitant to do that because of his SA issues. Benzos never really changed his behavior downstairs anyway. I've checked with discharge planning and paitient is still on the wait list for [facility].  Electronic Signatures: Raul Torrance, Jackquline DenmarkJohn T (MD)  (Signed on 15-Apr-14 17:14)  Authored  Last Updated: 15-Apr-14 17:14 by Audery Amellapacs, Saron Vanorman T (MD)

## 2015-03-13 NOTE — Discharge Summary (Signed)
PATIENT NAME:  [name], [name] MR#:  [id number] DATE OF BIRTH:  [birth year]  DATE OF ADMISSION:  10/09/2013 DATE OF DISCHARGE:  10/11/2013  PRESENTING COMPLAINT: Hypoglycemia, insulin overdose, intentional.  DISCHARGE DIAGNOSES: Intentional insulin overdose, recurrent.   CONDITION ON DISCHARGE: Fair.   CODE STATUS: Full code.   MEDICATIONS:  1.  Nexium 40 mg p.o. b.i.[name].  2.  Acetaminophen/hydrocodone 325/5 mg 1 tablet every 8 hours as needed.  3.  Topiramate 50 mg b.i.[name].  4.  Trazodone 50 mg half a tablet at bedtime.  5.  Seroquel 200 mg 1 tablet at bedtime.   FOLLOWUP: With your ACT team as before.   DIET: Regular.  PSYCHIATRIC CONSULTATION: Dr. Garnetta [name].   LABS: Discharge glucose was 75. CBC within normal limits. Basic metabolic panel within normal limits. UA negative for UTI. Urine drug screen positive for MDMA.   BRIEF SUMMARY OF HOSPITAL COURSE: [name] is 39 year old, obese, Caucasian gentleman who has been admitted several times with similar complaints, comes in with:  1.  Intentional insulin overdose, suicidal. The patient came in with low sugars in the 30s and 40s. He claimed he took his father's insulin as always as part of a suicidal overdose. He was given IV amps of dextrose and started on dextrose drip. His sugars remained stable and had intermittent drops down in the 40s. However, the patient remained asymptomatic despite sugars being in the 40s. He is tolerating p.o. diet very well and has had a very good appetite. The patient did have a sitter during the hospital stay. Dr. Garnetta [name] saw the patient and recommends continue psych meds and discharge home and follow up with ACT team.  2.  Alcohol abuse. He was kept on CIWA protocol, not in DTs. The patient was advised abstinence.  3.  Chronic depression. The patient is supposed to continue his home meds and follow the ACT team. Hospital stay otherwise remained stable.   CODE STATUS: He remained a full code.   TIME SPENT:  40 minutes.  ____________________________ Shane HailSona A. Allena KatzPatel, MD sap:aw [name]: 10/14/2013 07:03:40 ET T: 10/14/2013 08:19:16 ET JOB#: [id number]  cc: Shane Cerreta A. Allena KatzPatel, MD, <Dictator> Shane OraSONA A Antwonette Feliz MD ELECTRONICALLY SIGNED 10/14/2013 14:18

## 2015-03-13 NOTE — Discharge Summary (Signed)
PATIENT NAME:  [name], [name] MR#:  [id number] DATE OF BIRTH:  1976/11/14  DATE OF ADMISSION:  09/10/2013 DATE OF DISCHARGE:  09/12/2013  ADMISSION DIAGNOSIS: Hypoglycemia from intentional overdose.   DISCHARGE DIAGNOSES:  1.  Hypoglycemia from intentional suicidal ideation/intentional overdose of insulin.  2.  Suicidal ideations. 3.  Alcohol abuse. 4.  Tobacco abuse. 5.  Hypertension.  CONSULTATIONS: Dr. Toni Amendlapacs.   HOSPITAL COURSE: This is a 39 year old male who presented with hypoglycemia secondary to an intentional overdose. For further details, please refer to the H and P. 1. Hypoglycemia due to taking his father's insulin as a suicidal ideation: The patient took father's insulin. He was on a D10 drip. This was transitioned to normal saline, and then he was off of all fluids. His blood sugars are better. He does not have a history of diabetes.  2.  Suicidal ideation: Appreciate psychiatry consultation. The patient denied suicidal ideation and therefore, per psychiatry, was stable to go home.  3.  Alcohol abuse: Uneventful detox.  4.  Tobacco abuse: The patient was counseled. Not interested in quitting smoking. 5.  Hypertension: The patient is on Norvasc.   DISCHARGE MEDICATIONS: 1.  Nicotine 10 mg inhalation q.4 h. p.r.n.  2.  Norvasc 10 mg daily.   DISCHARGE DIET: Low-sodium.   DISCHARGE ACTIVITY: As tolerated.   FOLLOWUP: The patient will follow up in 2 weeks with his psychiatrist.   TIME SPENT: 35 minutes.   ____________________________ Janyth ContesSital P. Juliene PinaMody, MD spm:jcm [name]: 10/09/2013 12:32:13 ET T: 10/09/2013 14:08:54 ET JOB#: [id number]  cc: Philena Obey P. Juliene PinaMody, MD, <Dictator> Janyth ContesSITAL P Elden Brucato MD ELECTRONICALLY SIGNED 10/10/2013 14:02

## 2015-03-13 NOTE — H&P (Signed)
PATIENT NAME:  [name], [name] MR#:  [id number] DATE OF BIRTH:  Aug 11, 1976  DATE OF ADMISSION:  12/16/2012  PRIMARY CARE PHYSICIAN: [facility] in [place].   REFERRING PHYSICIAN: Maricela Bo, MD   CHIEF COMPLAINT: Drug overdose with insulin.   HISTORY OF PRESENT ILLNESS: The patient is a 39 year old Caucasian male. This is one of several admissions with the same presentation of drug overdose with insulin. The patient is trying to commit suicide, then will inform the people that he did that and he will be brought here by EMS. Last admission here was on January 13th, discharged on January 17th.  At that time, his girlfriend broke up with him and he had drug overdose with insulin. It seems in the interim that his girlfriend came back but now broke back up again with him and therefore he took overdose with 7 pre-loaded NovoLog insulin pens, total 2100 units around 11:00 p.m. last night. He was brought here by EMS. The patient does not have a specific complaint. He is drinking juice right now to maintain his blood sugar. He was also started on intravenous dextrose 10%.    REVIEW OF SYSTEMS:  CONSTITUTIONAL: Denies having any fever. No chills. No fatigue.  EYES: No double vision. No blurring of vision.  ENT: No hearing impairment. No sore throat. No dysphagia.  CARDIOVASCULAR: No chest pain. No shortness of breath. No syncope.  RESPIRATORY: No shortness of breath. No chest pain. No cough. No hemoptysis.  GASTROINTESTINAL: No abdominal pain, no vomiting and no diarrhea.  GENITOURINARY: No dysuria. No frequency of urination.  MUSCULOSKELETAL: No joint pain or swelling. No muscular pain or swelling.  INTEGUMENTARY: No skin rash. No ulcers.  NEUROLOGY: No focal weakness. No seizure activity. No headache.  PSYCHIATRY: No anxiety. He is depressed.  ENDOCRINE: No polyuria or polydipsia. No heat or cold intolerance.  HEMATOLOGY: No easy bruisability. No lymph node enlargement.   PAST MEDICAL HISTORY:   Recurrent admissions with suicidal attempts by taking overdose of insulin.  Last 2 admissions were January 7th then January 13th.  History also includes diabetes mellitus type 1 on insulin, systemic hypertension, alcohol and tobacco abuse.   FAMILY HISTORY: His father has diabetes. There is also family history of depression.   SOCIAL HISTORY: He lives with his father in a trailer.   SOCIAL HABIT:  Chronic smoker of 1 pack per day since the age of 22. He drinks beer, 2 packs to 12 packs a day. No other drug abuse.   ADMISSION MEDICATIONS:  Enalapril 20 mg a day. Norvasc 10 mg a day. Nexium 40 mg a day.  Thiamine 100 mg a day.  Folic acid mg a day. Carbamazepine 200 mg twice a day. Seroquel 50 mg twice a day and 150 mg at bedtime. Insulin 70/30 taking 32 units subcutaneously twice a day. Nevertheless, a few hours ago he took overdose with 2100 units.   ALLERGIES:   1.  CODEINE CAUSING NAUSEA AND VOMITING. 2.  MOTRIN CAUSING GI SIDE EFFECTS. 3.  ASPIRIN CAUSING NAUSEA, VOMITING AND DIARRHEA 4.  PENICILLIN 5.  TRAMADOL.    PHYSICAL EXAMINATION: VITAL SIGNS: Blood pressure 140/79, respiratory rate 20, pulse 120, temperature 98.6, oxygen saturation 95%.  GENERAL APPEARANCE: Young male sitting at the bedside in no acute distress.  HEAD AND NECK: No pallor. No icterus. No cyanosis.  ENT: Ear examination revealed normal hearing, no discharge, no lesions. Nasal mucosa was normal, no discharge, no ulcers. Oral examination:  Lips, tongue were normal. No oral  thrush. No ulcers.  EYES: Revealed normal eyelids. The conjunctivae were slightly congested. Pupils are about 8 mm, round, equal and reactive to light.  NECK: Supple. Trachea at midline. No thyromegaly. No cervical lymphadenopathy. No masses.  HEART: Normal S1, S2. No S3, S4. No murmur or gallop. No carotid bruits.  LUNGS: Normal breathing pattern without use of accessory muscles. No rales. No wheezing.  ABDOMEN: Soft without tenderness. No  hepatosplenomegaly. No masses. No hernias.  SKIN: No ulcers. No subcutaneous nodules.  MUSCULOSKELETAL: No joint swelling. No clubbing.   NEUROLOGIC: Cranial nerves II through XII are intact. No focal motor deficit.  PSYCHIATRIC: The patient is alert and oriented x 3. Mood and affect were flat.   LABORATORY AND DIAGNOSTIC DATA:  His initial blood sugar was 78, then 235, then 58, 74, 65 and 67.   BUN 30, creatinine 0.8, sodium 133, potassium 3.6. Alcohol level was 0.15. Normal liver function tests and liver transaminases. The urine drug screen was negative. CBC showed white count 10,000, hemoglobin 14, hematocrit 43, platelet count 412. Urinalysis was unremarkable. Acetaminophen less than 2, salicylate less than 1.7.   ASSESSMENT: 1.  Drug overdose with insulin, taking good 2100 units.  2.  Suicidal attempt. The patient states that his girlfriend broke up with him again.  3.  Insulin-dependent diabetes mellitus type 1.  4.  Systemic hypertension.  5.  Hypoglycemia.  6.  Multiple admissions with suicidal attempts and usually uses insulin.  7.  Tobacco abuse.  8.  Alcohol abuse.   PLAN: Will admit to the Intensive Care Unit. We will continue his home medications as listed above. Will hold his insulin obviously.  Started on intravenous dextrose 10% and will monitor his blood sugar hourly until it is stable then will check the blood sugar less frequency. Psychiatric consultation again.   Time spent evaluating this patient: More than 50 minutes.    ____________________________ Shane CornersAmir M. Rudene Rearwish, MD amd:ct D: 12/16/2012 01:46:59 ET T: 12/16/2012 07:08:19 ET JOB#: [id number]  cc: Shane CornersAmir M. Rudene Rearwish, MD, <Dictator> Shane ScaleAMIR M Fabion Gatson MD ELECTRONICALLY SIGNED 12/17/2012 4:21

## 2015-03-13 NOTE — Consult Note (Signed)
Psychiatry: Patient seen. No new complaints. At his baseline mood and showing no acting out behavior. Denies any suicidal ideation. Currently he [facility]. I have it on authority that if he stays on medical he may be eligible for transfer in a few days to a week while if [facility] it would be more than a month. Will keep updating daily the status.  Electronic Signatures: Kolbee Bogusz, Jackquline DenmarkJohn T (MD)  (Signed on 10-Apr-14 11:54)  Authored  Last Updated: 10-Apr-14 11:54 by Audery Amellapacs, Lon Klippel T (MD)

## 2015-03-13 NOTE — Consult Note (Signed)
PATIENT NAMMarland Kitchen:  [name], [name] [id number] OF BIRTH:  1976/06/23 OF ADMISSION:  09/17/2014OF CONSULTATION:  08/08/2013  REFERRING PHYSICIAN: Melody HaverSrikar Suduni, MDPHYSICIAN: Ellin GoodieJolanta B. Eddie Koc, MD  REASON FOR CONSULTATION: To evaluate suicidal patient.  DATA: The patient is a 39 year old male with history of depression, anxiety and multiple suicide attempts.  COMPLAINT: "She left me."   OF PRESENT ILLNESS: [name] is well known to us. He has been admitted numerous times to medicine and psychiatry after suicide attempts by insulin overdose. Following last admission to psychiatry, he was transferred to [name] where he had been hospitalized for 3 months. He came to [name] twice this week after intentional overdose on long acting insulin after his girlfriend left him. She does so if he drinks. He admits to drinking 12-18 beers a day. He is ready for alcohol rehab. He has not been complaint with medications, as usual. He reports poor sleep and appetite, anhedonia, feeling of guilt, hopelessness, worthlessness, social isolation, heightened anxiety, and suicidal ideation. There are no psychotic symptoms. He denies other than alcohol substance use.  PSYCHIATRIC HISTORY: There is a long history of mental illness. First hospitalization was in his childhood following his parents' divorce. [facility] [place] [facility]. He had multiple admissions for drug rehab and recently for suicide attempts. He has been tried on numerous medications in the past including Prozac, Tegretol, lithium, Valium, Zoloft, but has never been medication compliant.  PSYCHIATRIC HISTORY: None reported.  MEDICAL HISTORY: Hypertension, GERD, history of pseudoseizures.   ALLERGIES: ASPIRIN, CODEINE, MOTRIN, PENICILLIN, TRAMADOL. ON ADMISSION: None. HISTORY: He is disabled. He now lives alone in a trailer with his 3 dogs. He was in a relationship of 10 years, but his girlfriend left him again. He does not know where she is. His father move away  recently. The patient has been using his father?s insulin. He tries to convince me that he no longer has access. I do not believe that insulin was coming from his father as he has overdosed on thousands of units. It has to be obtained in a different way.  OF SYSTEMS:  Difficult to obtain. The patient denies any pain or discomfort but feels tired.  EXAMINATION: SIGNS: Blood pressure 188/72, pulse 70, respirations 18, Temperature 97.2. This is a well-developed male in no acute distress. The pupils are equal, round and reactive to light. Sclerae are anicteric. Supple. No thyromegaly. Clear to auscultation. No dullness to percussion. Regular rhythm and rate. No murmurs, rubs or gallops. Soft, nontender, nondistended. Positive bowel sounds. Normal muscle strength in all extremities. No rashes or bruises. No cervical adenopathy. Cranial nerves II through XII are intact.   DATA: Chemistries: within normal limits except glucose on admission 57. Blood alcohol zero. LFTs within normal limits except for AST of 48. Urine tox screen positive for benzodiazepines. CBC within normal limits. Urinalysis is not suggestive of urinary tract infection. Serum acetaminophen less than 2. Serum salicylates 1.7.  STATUS EXAMINATION: The patient is asleep and difficult to arouse. He is oriented to person, place, time and situation. He is pleasant and polite. He recognizes me from previous admissions. He is in bed, wearing a hospital gown. He maintains limited eye contact. His speech is slightly slurred. Mood is depressed with flat affect. Thought process is logical with its own logic. Thought content: He denies suicidal ideation. There are no delusions or paranoia. There are no auditory or visual hallucinations. His cognition is grossly intact. His insight and judgment are nonexistent.  RISK ASSESSMENT: This is a  patient with a long history of mental illness and multiple suicide attempts by insulin overdose. He returned to the hospital  after another suicide attempt.   I:  Bipolar affective disorder II.    Alcohol dependence. II:  Borderline personality disorder. III:  Obesity, hypertension, gastroesophageal reflux disease. IV:  Mental illness, substance abuse, primary support, poor coping skills. V:  Global Assessment of Functioning: 25.   The patient is on IVC. Please continue sitter. Please continue CIWA for alcohol detox.  No psychotropic medications recommended. The patient was referred to [name] rehab facility. A bed may be available as soon as tomorrow.  Psychiatry will follow.   Electronic Signatures: Kristine Linea (MD)  (Signed on 18-Sep-14 20:05)  Authored  Last Updated: 18-Sep-14 20:05 by Kristine Linea (MD)

## 2015-03-13 NOTE — Consult Note (Signed)
PATIENT NAMEALROY, [name] [id number] OF BIRTH:  14-Jan-1976 OF ADMISSION:  11/02/2014OF CONSULTATION:  09/23/2013 PHYSICIAN:  Dr. Louanne Belton:  [name] B.  Cohick, MD FOR CONSULTATION: To evaluate the patient after a suicide attempt.  DATA: [name] is a 39 year old male with a history of personality disorder, mood instability and multiple suicidal gestures.  COMPLAINT: "I am really sorry?"  OF PRESENT ILLNESS: [name] is well known to [place]. He has a long history of substance abuse, mood instability and suicide attempts. He has been hospitalized in psychiatry numerous times. He is noncompliant with medications in the community. He repeatedly overdosed on insulin. This time he returns after another suicide attempt by Nyquil overdose while drunk just few hours following discharge from the hospital. As always, the patient does not have any explanation for his act. He says he feels sorry. He appears rather cheerful and ready to go home. The patient frequently reports feeling depressed, but it appears that his suicide attempts are unrelated to his mood. In the past, he had extended stays in psychiatry, [facility] and several alcohol rehabs.  The patient does not benefit from psychiatric hospitalization, whether he stays in a hospital for 3 hours or 3 months. The patient had been somewhat more stable in the past when he was with his fiance. She left him now for good, as she would not tolerate drinking. The patient has problem with alcohol but has not bee able to maintain sobriety in spite of several alcohol rehabs. He denies other substance or prescription pill use.  PSYCHIATRIC HISTORY: Multiple psychiatric hospitalizations. His first admission was to [facility] when he was a young teenager, at the time of his parents' divorce. He has been tried on numerous medications, but he is never compliant with treatment. He has a long history of alcoholism, lost his driver's  license due to DUI. 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  MEDICAL HISTORY: Frequent episodes of hypoglycemia after insulin overdose, GERD, hypertension.  No known drug allergies.  ON ADMISSION:  Medication Instructions  amlodipine 10 mg oral tablet  1 tab(s) orally once a day (in the morning) HTN   clonazepam 1 mg oral tablet  1 tab(s) orally 3 times a day   nexium 40 mg oral delayed release capsule  1 cap(s) orally once a day   quetiapine 200 mg oral tablet  1 tab(s) orally once a day (at bedtime)   HISTORY: As above. He lives alone in a trailer. He is disabled. His girlfriend is still his payee, and she helps him pay the bills. She also takes him grocery shopping. His father lives 4 trailers away. The father used to be the source of insulin. He owns 2 dogs and sending him for treatment for extensive period of time has always been problematic. In the past, we considered obtaining a guardianship, but it is unlikely that the patient would cooperate with the guardian. He refuses to be placed in a structured environment.  OF SYSTEMS: No fevers or chills. No weight changes. No double or blurred vision. No hearing loss. No shortness of breath or cough. No chest pain or orthopnea. No abdominal pain, nausea, vomiting or diarrhea. No incontinence or frequency. No heat or cold intolerance. No anemia or easy bruising. No acne or rash. No muscle or joint pain. No tingling or weakness. See history of present illness for details.  EXAMINATION:SIGNS:  Blood pressure 127/75, pulse 75, respirations 18, temperature 97.6. This is a slightly obese male in no acute distress. The rest of the physical examination is deferred to his primary attending.  DATA: Chemistries within normal limits. Blood alcohol level on admission 0.174. LFTs within normal limits. TSH 1.68. CBC within normal limits. UA is not suggestive of UTI.   STATUS EXAMINATION:  The patient is alert and oriented to person, place, time and situation. He is pleasant, polite and cooperative. He recognizes me from previous admission. He maintains good eye contact. His speech is soft. Mood is fine with flat affect. Thought process is logical and goal oriented with its own logic. He denies suicidal or homicidal ideation. There are no delusions or paranoia. There are no auditory or visual hallucinations. His cognition is grossly intact. His insight and judgment are extremely poor.  I: Mood disorder, not otherwise specified. Alcohol dependence. II: Borderline personality disorder. III: Hypertension, gastroesophageal reflux disease, status post insulin overdose. IV: Mental illness. V: Global Assessment of Functioning 50.    The patient no longer meets criteria for IVC. I will terminate proceedings. Please discharge as appropriate.    Discontinue sitter.    The patient does not need alcohol detox. I will discontinue CIWA protocol. No psychotropic medications recommended. The patient has prescription for 200 mg Seroquel nightly.    Case discussed with the attending and Frederico Hammanara Collier, clinical social worker.  He will follow up with [facility] ACT team on Tuesday, November 5. 2014.    Electronic Signatures: Kristine LineaPucilowska, Kristalynn Coddington (MD)  (Signed on 05-Nov-14 00:23)  Authored  Last Updated: 05-Nov-14 00:23 by Kristine LineaPucilowska, Geraldine Tesar (MD)

## 2015-03-13 NOTE — H&P (Signed)
PATIENT NAME:  [name], [name] MR#:  [id number] DATE OF BIRTH:  11-11-1976  DATE OF ADMISSION:  01/11/2013  REFERRING PHYSICIAN: Dr. Lucrezia Europe.  PRIMARY CARE PROVIDER:  [facility] in [place].   CHIEF COMPLAINT:  Drug overdose on insulin and Tegretol.   HISTORY OF PRESENT ILLNESS: The patient is a 39 year old white male with multiple admissions for insulin overdose, who reports that he took an overdose on insulin Lantus that belonged to his bother. He reported that he took a total of 5 vials of 300 units of Lantus. When he arrived in the ED, his blood sugar was 27. He was started on D10. His blood sugar has fluctuated. It has gone down as low as 39, but has gone as high as 164. The pharmacy tech verified with the pharmacy to see what medication he was on, and he is only on metformin 500 one tab p.o. daily, Prilosec and Tegretol twice daily. He is not supposed to be on insulin, according to what we have from his pharmacy. The patient initially reported that that was his insulin, now he states that this belongs to his father. He reports that he has been very depressed and wanted to end his life. The patient, because he took Tegretol, poison control was called by the ER doctor. They recommended charcoal, which he received 120 mL of charcoal, and they repeated another charcoal in 6 hours. The patient currently states that he is feeling very anxious,  and wants something for anxiety. He also complains of pain in his chest and pain in his abdomen, which is a chronic symptom, he reports. He otherwise denies any shortness of breath. He is awake.   PAST MEDICAL HISTORY:   1.  Recurrent admissions for insulin overdose. Last admission was 01/26, prior to that from January 7th to January 13th.  Has apparently diabetes type 1, but was discharged previously on no insulin, and according to his pharmacy records, he is not supposed to be taking insulin.  2.  History of hypertension.  3.  History of alcohol abuse.   4.  History of tobacco abuse.  5.  Gastroesophageal reflux disease.   MEDICATIONS:  That were verified by the pharmacy tech: Metformin 500 daily in the morning, Prilosec 20 daily, Tegretol 200, 1 tab p.o. b.i.d.   SOCIAL HISTORY:  He used to live with his father but now states that he lives alone. He smokes about 1 pack per day. He denies drinking beer; however, during his January hospitalization, they reported that he drinking 2  to 12 CASES OF BEER REVIEW OF SYSTEMS:    CONSTITUTIONAL: Denies any weight loss or weight gain. Complains of generalized weakness. No fevers, no chills.  EYES:  No double vision. No blurred vision.  EAR, NOSE, THROAT:  No difficulty with hearing. No sore throat. No dysphagia.  CARDIOVASCULAR:  Complains of chest pain. No shortness of breath. No syncope. No palpitations.  PULMONARY:  Denies any shortness of breath. No chronic obstructive pulmonary disease. No cough. No hemoptysis.  GASTROINTESTINAL:   Complains of epigastric pain. No nausea, vomiting or diarrhea.  GENITOURINARY:  Denies any frequency, urgency or hesitancy.  MUSCULOSKELETAL:  Denies any joint pain or swelling.  SKIN:  Denies any skin rash or any other lesions.  NEUROLOGICAL: Denies any focal weakness or seizures. No history of CVA or TIA. 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  PSYCHIATRIC:  Denies any anxiety. He is depressed.  ENDOCRINE:  Denies any polyuria or polydipsia. He  is a diabetic. Denies any heat or cold intolerance.  HEMATOLOGIC:  Denies easy bruisability or bleeding.   PHYSICAL EXAMINATION: VITAL SIGNS:  Temperature 98.1, pulse 81, respirations 18, blood pressure 132/87, O2 of 97%.  GENERAL:  The patient is an obese male, currently awake, appears a little anxious.  HEENT:  Pupils are bilaterally dilated, but reactive to light and accommodation. Extraocular movements intact. There is no conjunctival pallor. No scleral icterus. Nasal exam shows no drainage or ulceration.   Oropharynx is clear without any exudate.  NECK:  No thyromegaly. No carotid bruits.  CARDIOVASCULAR:  Regular rate and rhythm. No murmurs, rubs, clicks or gallops. PMI is not displaced.  LUNGS:  Clear to auscultation bilaterally without any rales, rhonchi or wheezing.  ABDOMEN:  Mild epigastric tenderness. No guarding, no rebound.  EXTREMITIES:  No clubbing, cyanosis or edema.  SKIN:  No rash.  MUSCULOSKELETAL:  There is no joint swelling or erythema.  NEUROLOGIC:  Cranial nerves II through XII grossly intact. No focal deficits.  PSYCHIATRIC: The patient is awake, alert x 3. He is a little anxious.   PERTINENT LABORATORY AND EVALUATIONS: In the ED, his glucose was 38, BUN 4, creatinine 0.60, sodium 139, potassium 2.8, chloride 106, CO2 was 21. Alcohol level was 0.135. LFTs were normal. TSH 2.63. Tegretol level was 16.  WBC 9.1, hemoglobin 13.3, platelet count 309. His UA was negative. Acetaminophen level less than 2, salicylate level 2.3.   ASSESSMENT AND PLAN:  The patient is a 39 year old white male with history of insulin-dependent diabetes, depression, multiple suicidal attempts in the past, presents with taking 1500 units of Lantus, which based on his sugar, is  hard to believe. However, it is a long-acting insulin, and the patient has not been prescribed this insulin; however, we need to monitor him closely.  1.  Overdose on insulin and Tegretol. We will keep him on D10.  Follow blood glucose every hour. Monitor his mental status. Monitor him for any type of seizure activity. In terms of the Tegretol overdose, he has received charcoal. He is showing some anticholinergic signs, including dilated pupils, but he is not tachycardic at this point. We will continue to monitor him on telemetry. We will repeat an EKG in the morning. Monitor for any neurological symptoms including seizures. Due to his significant overdoses, the patient is critical, and we will monitor him in the ICU. He will also need a  sitter and q.1 blood sugar monitoring.  2.  Diabetes, type unknown. Obviously, we are going to hold insulin. We will get a hemoglobin A1c.  3.  Depression. We will get a psych eval.  4.  History of alcohol abuse. We will place him on CIWA protocol.  5.  Hypokalemia of unclear cause. We will replace his potassium, check a magnesium.  Please note, critical care time, 45 minutes is spent. The patient at high risk of cardiopulmonary complications from his overdose.     ____________________________ Lacie ScottsShreyang H. Allena KatzPatel, MD shp:dm D: 01/11/2013 08:43:52 ET T: 01/11/2013 10:34:38 ET JOB#: [id number]  cc: Sherian Valenza H. Allena KatzPatel, MD, <Dictator> Charise CarwinSHREYANG H Miosha Behe MD ELECTRONICALLY SIGNED 01/12/2013 12:18

## 2015-03-13 NOTE — Consult Note (Signed)
PATIENT NAME:  Shane ShireBAILEY, Jaymarion D MR#:  [id number] DATE OF BIRTH:  1976/05/07  DATE OF CONSULTATION:  08/04/2013  REFERRING PHYSICIAN:   CONSULTING PHYSICIAN:  Mitesh Rosendahl K. Shoaib Siefker, MD  SUBJECTIVE:  The patient is a 39 year old white male, not employed and is on Disability for seizures.  The patient is single, not married and recently broke up with a girlfriend of 10 years.  This is 1 of several admissions to [facility] since in June after he took an overdose of insulin in a suicide attempt.  The patient reports that he got depressed after final breakup with his girlfriend of 10 years and he took an overdose of the same.  In addition, he reported that he drinks 12-pack of beer per day for the past 8 years or so.    OBJECTIVE:  The patient is seen in the CCU room.  He appears relaxed, alert and oriented, calm, pleasant and cooperative.  No agitation.  Admits that he was depressed because of the breakup with girlfriend but he feels better because he is being helped here.  No psychosis.  Denies any ideas or plans to hurt himself or others and contracts for safety.  He reports that he feels that he does not need one-on-one observation as he feels safe here and, in fact, he is eager to go home because he already feels better.  Insight and judgment guarded.    IMPRESSION:  Alcohol dependence, chronic, continuous, drinks 12-pack per day for 8 years.  Depression with behavioral disturbances and suicide attempts on multiple occasions.    RECOMMENDATIONS:  Continue current treatment.  Discontinue one-on-one observation as the patient contracts for safety.  Discharge the patient to the community when he is medically cleared and stable.  The patient may get followup appointment in the community for his alcohol dependence and be referred to a program if he is interested in getting help and be followed at a local mental health clinic for his depression where he can get medication for depression, anxiety and be helped with therapy  for coping skills.  Social services is to look into his transportation problem because the patient reports it is difficult for him to keep up his appointments because of transportation issues.    ____________________________ Jannet MantisSurya K. Guss Bundehalla, MD skc:cs D: 08/04/2013 17:22:03 ET T: 08/04/2013 18:54:45 ET JOB#: [id number]  cc: Monika SalkSurya K. Guss Bundehalla, MD, <Dictator> Beau FannySURYA K Takina Busser MD ELECTRONICALLY SIGNED 08/10/2013 22:57

## 2015-03-13 NOTE — Consult Note (Signed)
Details:   - Psychiatry: Patient continues to be irritated. He is denying having acute suicidal ideation but has demonstrated repeatedly he is on trustworthiness. Commitment has been renewed. We [facility] to [facility].   Electronic Signatures: Clapacs, Jackquline DenmarkJohn T (MD)  (Signed 18-Apr-14 17:02)  Authored: Details   Last Updated: 18-Apr-14 17:02 by Audery Amellapacs, John T (MD)

## 2015-03-13 NOTE — H&P (Signed)
PATIENT NAME:  KODIE, KISHI MR#:  [id number] DATE OF BIRTH:  Oct 07, 1976  DATE OF ADMISSION:  01/18/2013  REFERRING PHYSICIAN: Vipul S. Sherryll Burger, MD  ATTENDING PHYSICIAN: Jolanta B. Pucilowska, MD  IDENTIFYING DATA: The patient is a 39 year old male with history of depression, anxiety and multiple suicide attempts.   CHIEF COMPLAINT: The patient is unwilling to state.   HISTORY OF PRESENT ILLNESS: The patient was hospitalized at [facility] extensively in the past several months, usually following a suicide attempt after insulin overdose. He was discharged from Behavioral Medicine Unit earlier during the day and returned after another suicide attempt the same evening. He disclosed to admitting physician that he overdosed on insulin because he still felt suicidal. This is the first time the patient makes such admission. He always pretends that he does not know what happened or that he took medication in error, which could never be true because he has not been prescribed any antiglycemic agents for several months now. We know that he uses his father's insulin and prior to this most recent discharge, we spoke with his father, who is well aware of his son's problems with drugs, alcohol and risky behavior. He knows that his son uses his insulin and has not been able to protect his supplies. Following discharge, the patient felt bored, did not have anybody to talk to as he lives by himself, must have gone to his father's trailer to pick up medication. He usually denies any symptoms of depression, anxiety or psychosis. He admitted today that he feels depressed, but yesterday he felt cheerful, positive, optimistic about the future and ready to be discharged. I do not think he can be trusted. There is a long history of substance abuse. His father calls him a crack head. He has a history of alcohol, prescription pill and illicit substance use. This year alone since January, he was referred to  [facility] rehab facility twice. In fact, in January and February, he probably spent more time in the hospital than at home. It feels that his suicide attempts or maybe drug trips have been spiraling out of control. He used to come to the hospital every few months. Recently it is every few days, or now every few hours.   PAST PSYCHIATRIC HISTORY: There is a long history of mental illness. First hospitalization was in his childhood following his parents' divorce. He spent several months in [place]. He had multiple admissions for drug rehab and recently for suicide attempts. He has been tried on numerous medications in the past including Prozac, Tegretol, lithium, Valium, Zoloft, but has never been medication compliant. He does not attend his appointments with psychiatrist and takes his medications very selectively. He complains that he has no transportation, as he got his father's car impounded. At his last discharge, we made sure that he will work with community support team to provide transportation to his appointments. He did not have a chance to take advantage of it. His previous hospitalization we discharged him to home on substance abuse outpatient involuntary commitment, but the patient returned to the hospital very soon after am overdose without seeing his psychiatrist as well.   FAMILY PSYCHIATRIC HISTORY: None reported.   PAST MEDICAL HISTORY: Hypertension, GERD, history of pseudoseizures.   ALLERGIES: ASPIRIN, CODEINE, MOTRIN, PENICILLIN, TRAMADOL.  MEDICATIONS AT THE TIME OF TRANSFER FROM MEDICAL FLOOR: Seroquel 150 mg at bedtime, Prilosec OTC 20 mg daily, hydroxyzine 100 mg twice daily, Neurontin 300 mg 3 times daily, Prozac 40 mg  daily, enalapril 20 mg daily, amlodipine 10 mg daily.   SOCIAL HISTORY: He is disabled. He lives alone in a trailer with his 3 dogs. He was in a relationship of 10 years, but his girlfriend left him this summer. They are still friends and she is in many ways involved in  his care. His mother lives in a nursing home. His father lives in another trailer next door.   REVIEW OF SYSTEMS:  .  CONSTITUTIONAL: No fevers or chills. No weight changes.  EYES: No double or blurred vision.  ENT: No hearing loss.  RESPIRATORY: No shortness of breath or cough.  CARDIOVASCULAR: No chest pain or orthopnea.  GASTROINTESTINAL: No abdominal pain, nausea, vomiting or diarrhea.  GENITOURINARY: No incontinence or frequency.  ENDOCRINE: No heat or cold intolerance.  LYMPHATIC: No anemia or easy bruising.  INTEGUMENTARY: No acne or rash.  MUSCULOSKELETAL: No muscle or joint pain.  NEUROLOGIC: No tingling or weakness.  PSYCHIATRIC: See history of present illness for details.   PHYSICAL EXAMINATION:  VITAL SIGNS: Blood pressure 138/78, pulse 80, respirations 23.  GENERAL: This is a well-developed male in no acute distress.  HEENT: The pupils are equal, round and reactive to light. Sclerae are anicteric.  NECK: Supple. No thyromegaly.  LUNGS: Clear to auscultation. No dullness to percussion.  HEART: Regular rhythm and rate. No murmurs, rubs or gallops.  ABDOMEN: Soft, nontender, nondistended. Positive bowel sounds.  MUSCULOSKELETAL: Normal muscle strength in all extremities.  SKIN: No rashes or bruises.  LYMPHATIC: No cervical adenopathy.  NEUROLOGIC: Cranial nerves II through XII are intact.    LABORATORY DATA: Chemistries: Blood glucose on admission 60, BUN 5, creatinine 0.55, sodium 139, potassium 4.7. Blood alcohol 0.101. LFTs within normal limits except for AST of 61. TSH 3.68. Urine tox screen negative for substances. CBC within normal limits. Urinalysis is not suggestive of urinary tract infection. Serum acetaminophen less than 2. Serum salicylates 1.7.   MENTAL STATUS EXAMINATION ON ADMISSION: The patient is examined in CCU. He is alert and oriented to person, place, time and situation. He is pleasant and polite. He is in bed, wearing hospital gown. He maintains good eye  contact. His speech is slightly slurred. Mood is depressed with flat affect. Thought process is logical with its own logic. Thought content: He denies suicidal ideation. There are no delusions or paranoia. There are no auditory or visual hallucinations. His cognition is grossly intact. His insight and judgment are nonexistent.   SUICIDE RISK ASSESSMENT: This is a patient with a long history of mental illness and multiple suicide attempts on medications by insulin overdose and recently Tegretol. He returned to the hospital just a few hours following discharge from Behavioral Medicine Unit.   INITIAL DIAGNOSES:  AXIS I: Bipolar affective disorder II. Alcohol dependence.  AXIS II: Borderline personality disorder.  AXIS III: Obesity, hypertension, gastroesophageal reflux disease.  AXIS IV: Mental illness, substance abuse, primary support, extremely poor coping skills.  AXIS V: Global Assessment of Functioning on admission: 50.   PLAN:  The patient was admitted again to [facility] Unit for safety, stabilization and medication management. He was initially placed on suicide precautions and was closely monitored for any unsafe behaviors. He underwent full psychiatric and risk assessment. He received pharmacotherapy, individual and group psychotherapy, substance abuse counseling, and support from therapeutic milieu.  1.  Suicidal ideation: The patient denies. 2.  Mood: We will continue Seroquel and Prozac.  3.  Medical: We will continue all other  medications as prescribed by primary provider.  4.  Anxiety: The patient was started on Neurontin while at [facility]. We will continue that.  5.  Disposition: The patient had over 10 suicide attempts by insulin overdose in recent months. We will refer him to [facility] for further treatment and stabilization. We also are going to initiate the process of obtaining guardianship for this unfortunate man. The only  safe disposition that we can think of would be placement in a group home in an environment where he would not have access to dangerous medications. The patient adamantly refuses placement. We will allow a judge to decide about his competency.   ____________________________ Ellin Goodie Pucilowska, MD jbp:jm D: 01/18/2013 17:36:00 ET T: 01/18/2013 18:00:17 ET JOB#: [id number]  cc: Jolanta B. Jennet Maduro, MD, <Dictator> Shari Prows MD ELECTRONICALLY SIGNED 01/21/2013 7:54

## 2015-03-13 NOTE — Consult Note (Signed)
Brief Consult Note: Diagnosis: Mood disorder NOS, Borderline personality disorder, Alcohol dependence.   Patient was seen by consultant.   Consult note dictated.   Recommend further assessment or treatment.   Discussed with Attending MD.   Comments: [name] returns to the hospital hypohlycemic just few hours following discharge from medical floor after another insulin overdose.   PLAN: 1. The patient is on IVC. Please continue sitter.   2. Will add Seroquel at night for insomnia. The patient has been always noncompliant with medications.   3. Case discussed with the attending and Frederico Hammanara Collier, SW. Will attempt guardianship.  Electronic Signatures: Kristine LineaPucilowska, Perris Tripathi (MD)  (Signed 27-Oct-14 16:40)  Authored: Brief Consult Note   Last Updated: 27-Oct-14 16:40 by Kristine LineaPucilowska, Johnnie Goynes (MD)

## 2015-03-13 NOTE — Consult Note (Signed)
PATIENT NAME:  [name], [name] MR#:  [id number] DATE OF BIRTH:  19-Nov-1976  DATE OF ADMISSION:  09/13/2013 DATE OF CONSULTATION:  09/16/2013  REFERRING PHYSICIAN:  Dr. Adrian SaranSital Mody CONSULTING PHYSICIAN:  Jolanta B. Pucilowska, MD  REASON FOR CONSULTATION: To evaluate the patient after a suicide attempt.   IDENTIFYING DATA: [name] is a 39 year old male with a history of personality disorder,  mood instability and multiple suicidal gestures.   CHIEF COMPLAINT: "Can I go home now?"   HISTORY OF PRESENT ILLNESS: [name] is well known to us. He has a long history of substance abuse, mood instability and suicide attempts. He has been hospitalized in psychiatry  numerous times. He is never compliant with medications in the community. He repeatedly overdoses on insulin. The source of the insulin is unclear. He claims this is his father's, but I doubt it. The patient has very frequent overdoses. I am suspicious that he obtains his insulin illegally.  In the past, he was using long-acting insulin lately. It appears to be short acting. It is quite possible that he buys it at [employer]. One needs no prescription to obtain short-acting insulin from them. In addition, they keep no records of the transaction. It is over-the-counter. The patient was just hospitalized on the medical floor for a similar attempt. He was discharged to home and returned the following day after insulin overdose. He always manages to call EMS in time. The patient frequently reports feeling depressed, but it appears that his suicide attempts are unrelated to his mood. In the past, he had extended stays in psychiatry, [facility] and several alcohol rehabs.  The patient does not benefit from psychiatric hospitalization, whether he stays in a hospital for 3 hours or 3 months, it makes no difference.  The patient had been somewhat more stable in the past when he was with his fiance. She left him now for good, as she  would not tolerate drinking. The patient drinks beer, is unable to tell me how much, but oftentimes when he comes here for admission after a suicide attempt, there is increased blood alcohol level. The patient does not consider overdosing on insulin a suicide attempt, and most definitely he wants to live. He denies psychotic symptoms, denies symptoms of depression, but reports at the time of attempt, he felt very, very depressed. He has been drinking some, but does not believe that he needs detox. He denies other substance or prescription pill use.   PAST PSYCHIATRIC HISTORY: Multiple psychiatric hospitalizations. [facility] when he was a young teenager, at the time of his parents' divorce. He has been tried on numerous medications, but he is never compliant with treatment. He has a long history of alcoholism, lost his driver's license due to DUI. 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   PAST MEDICAL HISTORY: Frequent episodes of hypoglycemia after insulin overdose, GERD, hypertension.   ALLERGIES: No known drug allergies.   MEDICATIONS ON ADMISSION: Norvasc 10 mg daily.   SOCIAL HISTORY: As above. He lives alone in a trailer. He is disabled. His girlfriend is still his payee, and she helps him pay the bills. She also takes him grocery shopping. His father lives 4 trailers away.  We had conversations with the father, asking him to better protect his insulin, but the father feels that this is impossible and refuses to  buy a locked box. We have report that the conditions at his trailer are unlivable. The patient denies. He owns 2 dogs and sending him for treatment for extensive period of time has always been problematic. In the past, we considered obtaining a guardianship, but it is unlikely that the patient would cooperate with a guardian. He refuses to be placed in a structured  environment.   REVIEW OF SYSTEMS:  CONSTITUTIONAL: No fevers or chills. No weight changes.  EYES: No double or blurred vision.  ENT: No hearing loss.  RESPIRATORY: No shortness of breath or cough.  CARDIOVASCULAR: No chest pain or orthopnea.  GASTROINTESTINAL: No abdominal pain, nausea, vomiting or diarrhea.  GENITOURINARY: No incontinence or frequency.  ENDOCRINE: No heat or cold intolerance.  LYMPHATIC: No anemia or easy bruising.  INTEGUMENTARY: No acne or rash.  MUSCULOSKELETAL: No muscle or joint pain.  NEUROLOGIC: No tingling or weakness.  PSYCHIATRIC: See history of present illness for details.   PHYSICAL EXAMINATION: VITAL SIGNS: Blood pressure 122/79, pulse 77, respirations 18, temperature 98.2.  GENERAL: This is a slightly obese male in no acute distress. The rest of the physical examination is deferred to his primary attending.   LABORATORY DATA: Chemistries within normal limits. Blood alcohol level on admission 50. Blood alcohol level not tested.   MENTAL STATUS EXAMINATION: The patient is alert and oriented to person, place, time and situation. He is pleasant, polite and cooperative. He recognizes me from previous admission. He maintains good eye contact. His speech is soft. Mood is fine with flat affect. Thought process is logical and goal oriented with its own logic. He denies suicidal or homicidal ideation. There are no delusions or paranoia. There are no auditory or visual hallucinations. His cognition is grossly intact. His insight and judgment are extremely poor.   DIAGNOSES: AXIS I: Mood disorder, not otherwise specified. Alcohol dependence.  AXIS II: Borderline personality disorder.  AXIS III: Hypertension, gastroesophageal reflux disease, status post insulin overdose.  AXIS IV: Mental illness.  AXIS V: Global Assessment of Functioning 50.   PLAN:   1.  The patient is on IVC. We will continue sitter.  2.  He is asking for Seroquel at night for sleep. I will  prescribe 200 mg. I will not prescribe other medications, as he is never compliant and denies any symptoms of mood or psychosis.  3.  Frederico Hamman, clinical social worker, is involved in his case, and we are reassessing our options again. The patient does give [place] permission to talk to his father and his girlfriend, who is a payee. We will try to assess whether obtaining a guardianship,  getting a new payee or recommending substance abuse treatment is a way to go. As above, the patient does not benefit from admission to psychiatric hospital or substance abuse treatment. He really does not benefit from admission to medicine either.  He is admitted every time because his blood glucose is dangerously low. I will follow up with this unfortunate patient.    ____________________________ Ellin Goodie. Pucilowska, MD jbp:dmm [name]: 09/17/2013 11:46:00 ET T: 09/17/2013 12:12:16 ET JOB#: [id number]  cc: Jolanta B. Jennet Maduro, MD, <Dictator> Shari Prows MD ELECTRONICALLY SIGNED 09/19/2013 9:11

## 2015-03-13 NOTE — Consult Note (Signed)
PATIENT NAME:  [name], [name]#:  [id number] DATE OF BIRTH:  August 23, 1976  DATE OF CONSULTATION:  03/09/2013  REFERRING PHYSICIAN:  Dr. Nemiah CommanderKalisetti. CONSULTING PHYSICIAN:  Winn JockJames C. Saw Mendenhall, MD  REASON FOR CONSULTATION:  A 39 year old male with pain about the left fifth finger PIP joint.   HISTORY OF PRESENT ILLNESS:  The patient was admitted to the hospital for psychiatric issues, diabetic problems. He fell out of bed injuring his left hand. He was x-rayed and x-rays revealed evidence of subluxation of the PIP joint, probably chronic.   The patient denies any other injury. He complains of pain and points to the base of the fifth digit and at the PIP joint. There is no erythema or ecchymosis. There is mild tenderness.   The patient has a similar type presentation of his opposite hand. He states, however, that the left hand was not this bad previously.   PAST MEDICAL HISTORY:  Known diabetes, hypertension, alcohol abuse, tobacco abuse, bipolar affective disorder, acute compression, prior suicide attempt.   ALLERGIES:  ASPIRIN, CODEINE, MOTRIN, PENICILLIN and TYLENOL.   PAST SURGICAL HISTORY:  Negative.   SOCIAL HISTORY:  The patient lives alone, smokes 1 pack a day. Drinks alcohol. Denies illicit drugs.   REVIEW OF SYSTEMS:  For 13-point is unremarkable except as noted in history of present illness and the depression issue, left hand issue.   MEDICATIONS:  As per SCM, these are reviewed.   PHYSICAL EXAMINATION:  GENERAL:  A well-developed male. He is somewhat lethargic. He is responsive.  CERVICAL, THORACIC AND LUMBAR SPINE:  Unremarkable.  LEFT UPPER EXTREMITY:  Shoulder, elbow, wrist are unremarkable. The left hand reveals mild tenderness at the MCP joint of the fifth digit. The PIP joint reveals a boutonniere-type deformity with mild tenderness, mild swelling. There is a contracture of soft tissue on the volar aspect of the joint. It cannot be extended past about 60 degrees of flexion. It  will flex to 90 degrees. The volar skin band, which goes longitudinal, is compatible with a chronic illness. Neurovascular examination is intact. There is a slight contracture of the PIP joint of the ring finger.   The remainder of the wrist and hand are unremarkable and nontender.   RIGHT UPPER EXTREMITY:  Shoulder, elbow, wrist and hand are nontender. There is a contracture of about 35 degrees of the PIP joint of the fifth digit. Also a slight contracture of the PIP joint of the fourth digit. The remainder of the orthopaedic exam is unremarkable.   X-RAYS:  X-rays are reviewed with what appeared to be a chronic deformity.   IMPRESSION:  Left hand contusion, I feel this is most likely a contusion of a chronic issue involving the PIP joint. 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 This appears to be the main blocking mechanism as opposed to the joint. There is no fracture. With his underlying history, it is difficult to completely determine what the surgical necessity is.   I recommend observation, followed with a hand specialist in 7 to 10 days. Soft dressing as needed.  ____________________________ Winn JockJames C. Gerrit Heckaliff, MD jcc:jm D: 03/09/2013 12:21:58 ET T: 03/09/2013 12:59:22 ET JOB#: [id number]  cc: Winn JockJames C. Gerrit Heckaliff, MD, <Dictator> Winn JockJAMES C Tinea Nobile MD ELECTRONICALLY SIGNED 04/09/2013 17:39

## 2015-03-13 NOTE — Consult Note (Signed)
PATIENT NAME:  [name], [name] MR#:  [id number] DATE OF BIRTH:  05-Apr-1976  DATE OF CONSULTATION:  10/28/2013  REFERRING PHYSICIAN:  Dr. Maurilio Lovely.  CONSULTING PHYSICIAN:  Jolanta B. Pucilowska, MD  REASON FOR CONSULTATION: To evaluate a patient with suicidal tendencies.   IDENTIFYING DATA: [name] is a 39 year old male with history of mood instability and multiple suicide gestures.   CHIEF COMPLAINT: "I'm fine. I don't know what is going on."   HISTORY OF PRESENT ILLNESS:  [name] has a long history of mental illness.  [name] has been returning to the hospital, mostly medical floor, after repeated suicide attempts/suicide gestures by insulin overdose. It is unclear what is the source of his insulin, even though [name] administers high doses of long-acting insulin, [name] always tells [place] that [name] found it at the corner of his father's refrigerator. We find it unlikely that his father would have such a big supply of insulin, especially that [name] is well aware of the patient's tendencies. Personally, I do not believe that the patient abuses insulin in order to kill himself. Somehow, [name] always manages to call 911, and the ambulance brings him to the hospital safely. [name] does admit that [name] is doing it for fun, especially when drunk. [name] has been abusing alcohol, mostly beer. It is unclear how [name] gets a supply of alcohol, as his ex-girlfriend is his payee. The whole situation cannot easily be explained. [name] was hospitalized reportedly 14 times since January 2014. There were 2 gaps between February and March and then April and September, at which time [name] was hospitalized at [facility]. [name] returns on average 3 or 4 times a month to our hospital, always in hypoglycemia. [name] has been hospitalized in psychiatry here and elsewhere numerous times, but medications, hospitalizations, substance abuse treatment or intensive outpatient care, which involves ACT team, were unable to change his pattern of  behavior. The patient was hospitalized in medicine since the 4th. [name] was discharged to home today. [name] was consulted by Dr. Toni Amend.  When the patient was awaiting his transportation, we received a petition from Dr. Omelia Blackwater, his outpatient psychiatrist, who whole felt that the patient is unsafe to return home due to suicidal tendencies. I spoke with the patient in the Emergency Room. [name] is not suicidal or homicidal. [name] is cool and collected. [name] seems to understand his situation and assures me that [name] does not want to die. [name] states that there is no more insulin left at home. [name] states that no longer [name] wishes to drink. [name] states that his girlfriend did not give him any money this month. [name] denies psychotic symptoms. [name] denies, other than alcohol, substance use. [name] has not been abusing his prescription medications.    PAST PSYCHIATRIC HISTORY: There are multiple hospitalizations in psychiatry. [name] started as a young child during his parents' divorce, and stayed at [facility] for several months, then [name] was doing better when in a relationship, but this relationship dissipated due to drinking. The ex-girlfriend still is his payee and helps him some, most noticeably she takes care of his dogs while [name] is in the hospital so frequently. The patient did have a community support team, but [name] has never been compliant with psychotropic medications, and [name] now works with ACT team, and was started on numerous medications in hopes to improve his compliance, but unfortunately, per to Dr. Lilia Pro report, the patient only takes Klonopin. We kind of have known about it for  a long time and recently we only prescribed Seroquel at 200 mg at that time that the patient finds helpful and possibly uses regularly.  We do not prescribe any benzodiazepines. Antidepressants and antipsychotics have not been helpful most of the time because the patient has not been compliant. The patient has multiple admissions to [facility] rehab facility  in [place], but has not been able to maintain sobriety.   FAMILY PSYCHIATRIC HISTORY: Parents with depression.   PAST MEDICAL HISTORY: GERD.   ALLERGIES: No known drug allergies.   MEDICATIONS ON ADMISSION: Topamax 50 mg twice daily, trazodone 25 mg as needed for insomnia, Seroquel 200 mg at bedtime, Nexium 40 mg twice daily, Wellbutrin XR 300 mg daily.   SOCIAL HISTORY: [name] lives by himself in a trailer. [name] has 3 dogs. 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  The house is cluttered, dirty and in disarray.  His father lives just a few trailer houses up the road. His ex-girlfriend still gives him some support. During one of his hospitalizations, Dr. Omelia Blackwater recommended that we obtain a guardianship, and somehow, in spite of our commitment, the process has not been initiated. The patient refuses placement in a group home, most of all because of the dogs. Honestly, [name] hardly takes care of them as [name] is in the hospital most of the time. His relationship with his father is strained. The patient had been drinking so heavily that the police impounded his father's car, so they could not drive. The father moved a little further away, but still is in close proximity to where the patient lives. The father was asked to find a locked box for his insulin, but [name] refused to do so.   REVIEW OF SYSTEMS:  CONSTITUTIONAL: No fevers or chills. Positive for some weight gain.  EYES: No double or blurred vision.  EAR, NOSE, THROAT:  No hearing loss.  RESPIRATORY: No shortness of breath or cough.  CARDIOVASCULAR: No chest pain or orthopnea.  GASTROINTESTINAL: No abdominal pain, nausea, vomiting or diarrhea.  GENITOURINARY: No incontinence or frequency.  LYMPHATIC: No anemia or easy bruising.  INTEGUMENTARY: No acne or rash.  MUSCULOSKELETAL: No muscle or joint pain.  NEUROLOGIC: No tingling or weakness.  PSYCHIATRIC: See history of present illness for details.     PHYSICAL EXAMINATION:   VITAL SIGNS: Blood pressure 128/78, pulse 88, respirations 20, temperature 98.8.  GENERAL: This is an obese male in no acute distress. The rest of the physical examination is deferred to his primary attending.   LABORATORY DATA: Chemistries are within normal limits. Blood alcohol level is 0. LFTs are within normal limits except for alkaline phosphatase of 157. Urine tox screen positive for opiates and MDMA. CBC is within normal limits. Urinalysis is not suggestive of urinary tract infection. Serum acetaminophen and salicylates are low.   MENTAL STATUS EXAMINATION: The patient is alert and oriented to person, place, time and situation. [name] is pleasant, polite and cooperative. [name] recognizes me from previous admission. [name] maintains good eye contact. His speech is of normal rhythm, rate and volume. There is poverty of speech. His mood is fine with full affect. Thought process is logical with its own logic. [name] denies suicidal or homicidal ideation. There are no delusions or paranoia. There are no auditory or visual hallucinations. His cognition is grossly intact but probably suffers over time from frequent hypoglycemia. His insight and judgment are poor.   DIAGNOSES: AXIS I: Mood disorder, not otherwise specified. Alcohol  dependence.  AXIS II: Borderline personality disorder.  AXIS III: Gastroesophageal reflux disease.  AXIS IV: Mental illness, substance abuse, primary support, treatment noncompliance.  AXIS V: Global assessment of functioning 55.   PLAN:  1.  The patient does not meet criteria for IVC. I will terminate proceedings. Please discharge as appropriate.  2.  [name] is to continue medication as prescribed at the time from discharge from medicine. [name] will follow up with Dr. Omelia BlackwaterHeaden, although I am suspicious that Dr. Omelia BlackwaterHeaden will terminate this patient. It is very frustrating and next to impossible to provide care for this unfortunate patient. I still believe that we need to attempt to have a guardian  appointed.. I will consult with Dr. Toni Amendlapacs, his other consultant in psychiatry, and Frederico Hammanara Collier, our lead social worker, and see if we can comprehensive interdepartmental plan how to best serve [name].    ____________________________ Ellin GoodieJolanta B. Pucilowska, MD jbp:dmm D: 10/28/2013 19:52:00 ET T: 10/28/2013 20:13:03 ET JOB#: [id number]  cc: Jolanta B. Jennet MaduroPucilowska, MD, <Dictator> Shari ProwsJOLANTA B PUCILOWSKA MD ELECTRONICALLY SIGNED 11/19/2013 4:19

## 2015-03-13 NOTE — Consult Note (Signed)
PATIENT NAME:  [name], Reuben D MR#:  [id number] DATE OF BIRTH:  12-19-1975  DATE OF CONSULTATION:  10/16/2013  REFERRING PHYSICIAN:   CONSULTING PHYSICIAN:  Audery AmelJohn T. Clapacs, MD  IDENTIFYING INFORMATION AND REASON FOR CONSULTATION: A 39 year old man with a long history of behaviors of overdosing on insulin intentionally. Consultation for psychiatric management.   HISTORY OF PRESENT ILLNESS: Information obtained from the patient and the chart. This patient tells me that yesterday he got into an argument with his girlfriend. He says that she spent all of his money and did not pay his cell bill. It turns out that, what she spent the money on, sounds like it was rent and utilities, but he claims that he does not have any food to eat at home. He got upset with her and so he took an overdose of insulin. He says he took an old insulin pen belonging to his father and dialed it up as high as he could and probably took over 1000 units of insulin, Lantus. He claims that he was not drinking at the time and has not had any alcohol at all in about a week. He tells me that soon as he did it, he called 911 looking for help. He denies that he was having any actual thoughts of wanting to die or wanting to kill himself. He said that it was just that he was upset and wanted some help. He says that prior to that, his mood had felt like he was relatively stable recently. Denies any suicidal ideation. Denies having had psychotic symptoms. Denies homicidal ideation. He is currently being [facility] and says that he thinks that that has been helpful for him.   PAST PSYCHIATRIC HISTORY: The patient has done this many times before. He was here in the hospital just 4 days previously for the same thing. He is well known to the psychiatry service for this. We have evaluated him multiple times previously. He has had inpatient treatment on the psychiatry ward sometimes for weeks at a time, he was [facility] to  [facility] where he stayed for an extended time this summer and he has had extended stays in the hospital on the medical service. The patient is not suffering from a major depression in my opinion. He has a personality disorder and poor cognitive functioning, probably has had some brain injury along the way and is impulsive. It is typical of him that within hours or a day or sometimes even at the same time that he makes these overdoses, he will say he is not suicidal. When we have had him on the psychiatry ward or the medical ward for extended periods of time, he does not show symptoms of a major depression and neither does he show symptoms typical of a bipolar disorder. He tends to get bored easily. He shows a euthymic affect. Denies suicidal thoughts and shows poor insight into his condition. He is currently being seen by intensive in-home services with an ACT team. He tells me that they come and see him at least every week sometimes twice a week. They help to manage his medication and help him with recreational activities and he thinks it has been of some benefit to him. He has been on multiple antidepressants, mood stabilizers and antipsychotics without any clear benefit.   PAST MEDICAL HISTORY: The patient does not have diabetes, he tends to use insulin belonging to his  father, who no longer lives with him,  but the patient apparently has stashed up the store of it. He has chronic pain. Gastric reflux symptoms.   SOCIAL HISTORY: The patient gets disability. He lives with his girlfriend. They seem to have a somewhat tumultuous relationship. He has a close relationship with his father, but no longer lives with him. He does have ACT services now. The rest of his social life seems to be fairly limited.   SUBSTANCE ABUSE HISTORY: Frequently these overdoses take place in the context of alcohol abuse. Often he has consumed beer right before making one of these overdose attempts. Right now, he says  he has not had a drink in a week.   FAMILY HISTORY: None identified.   REVIEW OF SYSTEMS: The patient denies depression. Denies suicidal ideation. Denies hallucinations or delusions. Denies homicidal ideation. He is feeling a little bit tired, but not exhausted. He is not feeling sick to his stomach. No other specific physical symptoms.   MENTAL STATUS EXAM: Alert and oriented man. Cooperative with the exam. Appropriate eye contact. Psychomotor activity normal. Speech is a little bit slow, but pretty normal for him. Affect is euthymic, reactive, a little bit rueful and disappointed, but not depressed, not tearful. Mood is stated as okay. Thoughts are little bit slow, which is also typical for him, but not disorganized and not bizarre. No delusional statements. No evidence of paranoia. He denies any suicidal ideation or homicidal ideation. He is able to identify positive things he enjoys about his life. His insight and judgment are obviously chronically impaired. He is of low baseline intellectual functioning. Alert and oriented x 4.   LABORATORY RESULTS: It looks like his blood sugar was 74 from what I see when he came in. I have not checked, one subsequently. Otherwise, his alkaline phosphatase was a little up at 139. Drug screen positive for MDMA, which might represent Wellbutrin. CBC unremarkable. Looks like his blood sugars, mostly since he has been here this stay, have been in the mid 100s.   CURRENT MEDICATIONS: The patient tells me that they started him on Wellbutrin with his ACT team. That is possible. We do not have the details of it. When he was last here on November 21, he was discharged on Seroquel 200 mg at night, trazodone 50 mg at night, Topamax 50 mg b.i.d., Nexium 40 mg b.i.d. and Norco every 8 hours as needed.   ALLERGIES: No known drug allergies.   ASSESSMENT: A 39 year old man, who suffers from a personality disorder, probably borderline personality disorder mostly. Also, I think  has chronic intellectual dysfunction and may also have lost some intellectual function due to his multiple brain insults over the years. He has taken an overdose of insulin, but has not behaved in a suicidal manner. He called 911 immediately and has been completely cooperative with treatment and he denies suicidal ideation. The patient is well-known to [place] on the psychiatry service and has been discussed by all of the staff here as well as with hospital administrative staff. In my opinion, inpatient hospitalization psychiatrically has not been of any benefit to him, particularly not the kind of short-term hospitalization we would provide. I do not think that is likely to make any difference to his behavior. The patient needs to continue with close monitoring with his ACT team, which is probably the best treatment that could be provided right now.   TREATMENT PLAN: I have put in a social work consult requesting that they call his ACT team and try to get him  services over the holiday weekend if possible when he is discharged. They should be notified that he is here. I am not going to add the Wellbutrin right now or make any changes to his medicines, since that is not going to be a withdrawal problem if he is taking it. Counseling done with the patient and review the treatment plan, all of which he agrees with. I will acknowledge that the patient is a chronic risk to himself obviously; however, he is not an immediate risk right now probably of self-harm in the hospital and is not likely to benefit from short-term hospitalization in that it is not likely to change his dangerousness to himself.   DIAGNOSIS, PRINCIPAL AND PRIMARY:  AXIS II: Borderline personality disorder.  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  AXIS III: Overdose on insulin, chronic pain, gastric reflux.  AXIS IV: Severe from conflict with his girlfriend and chronic isolation.  AXIS V: Functioning at time of  evaluation 44.   ____________________________ Audery Amel, MD jtc:aw D: 10/16/2013 10:00:22 ET T: 10/16/2013 10:33:52 ET JOB#: [id number]  cc: Audery Amel, MD, <Dictator> Audery Amel MD ELECTRONICALLY SIGNED 10/16/2013 15:40

## 2015-03-13 NOTE — H&P (Signed)
PATIENT NAME:  [name], [name] MR#:  [id number] DATE OF BIRTH:  Jul 01, 1976  DATE OF ADMISSION:  08/07/2013  PRIMARY CARE PHYSICIAN: [facility] in [place].   CHIEF COMPLAINT: Insulin overdose on suicide attempt.   HISTORY OF PRESENTING ILLNESS: The patient is a 39 year old Caucasian male patient with history of multiple suicide attempts with insulin overdose and severe depression, presents to  the Emergency Room brought in by EMS after he took about five pens of  NovoLog. The patient is not clear on how much NovoLog he took. He mentioned that he is not a diabetic, but the insulin in his dad's which he left when he moved out. He mentioned that he insulin to kill himself after he broke up with his girlfriend. His story is surprisingly similar every time he presents to the Emergency Room by breaking up with his girlfriend and overdosing on his dad insulin, The patient was recently in this hospital a week prior and was discharged home after he denied any suicidal ideation on discharge; he was seen by Dr. Guss Bunde of psychiatry.    He does not have any concerns at this time, but asked for Klonopin for nerves.   He does not have diabetes and gets his insulin from dad.   PAST MEDICAL HISTORY: 1.  Hypertension.  2.  Alcohol abuse.  3.  Tobacco abuse.  4.  GERD.  5.  Suicidal attempts. 6.  Bipolar disorder.  7.  Severe depression.   PAST SURGICAL HISTORY: None.   ALLERGIES: ASPIRIN, CODEINE, MOTRIN, PENICILLIN AND TRAMADOL WHICH CAUSE GI DISTRESS.   SOCIAL HISTORY: The patient lives alone. Smokes a pack a day. Drinks about 12 beers a day.  CODE STATUS: Full code.   FAMILY HISTORY: Diabetes in multiple family members.   HOME MEDICATIONS: Include:  1.  Norvasc 10 mg oral daily.  2.  Clonazepam 0.5 mg oral b.i.d. as needed for anxiety.  3.  Enalapril 20 mg daily.  4.  Neurontin 400 mg oral 3 times a day.  5.  Prilosec 20 oral daily.  5.  Prozac 40 mg oral once a day.  6.  Seroquel 400 mg  oral once a day at bedtime and 50 mg 3 times a day.   REVIEW OF SYSTEMS:  CONSTITUTIONAL: No fever, fatigue, weakness.  EYES: No blurred vision, pain or redness.  ENT: No tinnitus, ear pain, hearing loss.  RESPIRATORY: No cough, wheezing or hemoptysis.  CARDIOVASCULAR: No chest pain, orthopnea, edema.  GASTROINTESTINAL: No nausea, vomiting, diarrhea, abdominal pain.  GENITOURINARY: No dysuria, hematuria, frequency.  ENDOCRINE: No polyuria, nocturia, thyroid problems.  HEMATOLOGY/LYMPHATIC:  No anemia, easy bruising, bleeding.  INTEGUMENTARY: No acne, rash. Does have scratches from his kittens on his hand.   MUSCULOSKELETAL: No back pain, arthritis.  NEUROLOGIC: No focal numbness, weakness, does complain of anxiety.  PSYCHIATRIAC:  He has anxiety, depression, suicidal attempt.   PHYSICAL EXAMINATION: VITAL SIGNS: Temperature 99, pulse of 96, blood pressure 139/76, saturating 97% on room air.  GENERAL: Obese Caucasian male patient sitting up in bed, comfortable, conversational, cooperative with exam.  PSYCHIATRIC: Alert and oriented x 3. Mood and affect appropriate. Judgment intact.  HEENT: Atraumatic, normocephalic. Oral mucosa moist and pink. External ears and nose normal. No pallor. No icterus. Pupils round, equal and reactive to light.  NECK: Supple. No thyromegaly. No palpable lymph nodes. Trachea midline. No palpable bruit, JVD.   CARDIOVASCULAR: S1, S2, tachycardic without any murmurs.  RESPIRATORY: Normal work of breathing. Clear to auscultation on  both sides.  GASTROINTESTINAL: Soft abdomen, nontender. Bowel sounds present. No hepatosplenomegaly palpable.  SKIN: Warm and dry. Has scratch marks on his hand supposedly from his kittens.   LYMPHATIC: No cervical lymphadenopathy.  NEUROLOGICAL: Motor strength 5/5 in upper and lower extremities.   LABORATORY, DIAGNOSTIC AND RADIOLOGIC DATA:  Show glucose of 57,56 and 74 after D50.    BUN 4, creatinine 0.83, alcohol 0.008. AST, ALT,  alkaline phosphatase normal. Urine drug screen is positive for benzodiazepines WBC 3.8, hemoglobin 12.8, platelets of 171. Tylenol level 15, salicylate less than 1.7.   EKG shows normal sinus rhythm. No acute ST-T wave changes.   ASSESSMENT AND PLAN: 1.  Suicidal attempt with insulin overdose with significant hypoglycemia in spite D50. The patient is critically ill,  will be on D10 with hourly Accu-Cheks. He will need to be monitored. We will consult psychiatry,  suicide precautions, the patient has had multiple admissions the same. He mentions he gets his insulin from his dad who seems to have moved out from the home. Hopefully that will stop his access to insulin and further admissions. Although there is a high chance he might overdose on other medications to kill himself. It is surprising that every time his reason seems to be the same of breaking up with his golfing. We will consult psychiatry for help with this case.  Continue his psychiatric medications. Sitter in the room. IVC in place.  2.  Hypertension. Continue home medications.  3. Alcohol abuse. The patient is high risk for alcohol withdraws. We will put him on CIWA protocol.  4.  Tobacco abuse. The patient has been counseled to quit smoking for greater than three minutes. I did counsel him previously during last admission, but he continues to smoke. We will put him on a nicotine patch.  5.  Deep venous thrombosis prophylaxis with ambulation.   TIME SPENT:  On this critically ill case with hypoglycemia in spite of D50, on D1: 55 minutes.   ____________________________ Molinda BailiffSrikar R. Aletha Allebach, MD srs:cc D: 08/07/2013 21:25:44 ET T: 08/07/2013 21:59:34 ET JOB#: [id number]  cc: Wardell HeathSrikar R. Jarah Pember, MD, <Dictator> [facility] Orie FishermanSRIKAR R Jayshon Dommer MD ELECTRONICALLY SIGNED 08/12/2013 10:44

## 2015-03-13 NOTE — Consult Note (Signed)
Psychiatry: Follow up on  this patient well known for his repeated suicide attempts and substance abuse and repeated failure to follow through on treatment advise. Today he is telling me he continues to have thoughts about killing himself and wanting to die. affect continues to be depressed and sluggish and hopeless. Says he has no money, no hope and no transport at home.  his continued endorsment of suicidal ideation he is not appropriate for discharge and if still in this condition [facility]. Do not DC the sitter. Will check up tomorrow and if he is still suicidal we can arrange transfer.  Electronic Signatures: Audery Amellapacs, John T (MD)  (Signed on 22-Oct-14 19:42)  Authored  Last Updated: 22-Oct-14 19:42 by Audery Amellapacs, John T (MD)

## 2015-03-13 NOTE — H&P (Signed)
PATIENT NAME:  [name], [name] MR#:  [id number] DATE OF BIRTH:  Dec 11, 1975  DATE OF ADMISSION:  10/15/2013  REASON FOR ADMISSION: Insulin overdose with severe hypoglycemia, intention now suicide attempt with insulin.   REFERRING PHYSICIAN: Janalyn Harder.   HISTORY OF PRESENT ILLNESS: This is a well-known 39 year old gentleman who has being hospitalized on multiple occasions due to the same reason, overdose with insulin.   The patient comes today with a history of having a fight with his girlfriend. The patient states that his girlfriend is no longer going to help him with his disability, for which he described depressed and took 1500 units of insulin, mix of the Lantus, he took 5 pens containing, again, 1500 units.   The patient states that he still has some insulin at home, and he has not been able to get rid of it. He has that insulin from his dad, who used to have all his medications there.   When the patient arrived, he was awake. He actually was the one who called EMS. His glucose was 24, and despite the fact that he has been on D10 at 250 mL an hour, his blood sugar dropped to 59 and then to 50, right now, finally, is around 100. We gave him 1 mg of glucagon, and right  now we are going to give him a glucagon drip here.   The patient does not have a good IV access, for which we are going to put a central line in and transfer him to the Intensive Care Unit to get monitored very closely. He needs Accu-Cheks every hour for now. Unless his blood sugar continues to drop, we will do it more frequently.   REVIEW OF SYSTEMS:  The patient is actually is awake to do his review of systems.  CONSTITUTIONAL:  No fever. No fatigue. No weakness or weight gain.  EYES: No blurred vision or double vision.  EARS, NOSE, THROAT: No tinnitus. No epistaxis. No difficulty swallowing.  RESPIRATORY: No cough, shortness of breath or peripheral respirations.  He did have a fall about a week ago, and he has had  soreness of his rib cage.   CARDIOVASCULAR: Chest pain as mentioned above due to possible rib fracture. No orthopnea, edema or syncope.   GASTROINTESTINAL: No nausea, vomiting, abdominal pain, constipation, diarrhea.  GENITOURINARY: No dysuria, hematuria or changes in frequency. No prostatitis.  ENDOCRINE: No polyuria, polydipsia, polyphagia, heat or cold intolerance.  HEMATOLOGIC AND LYMPHATIC: No anemia, easy bruising or bleeding.  MUSCULOSKELETAL: No significant neck pain. No back pain. No swollen glands.  SKIN: No rashes or petechiae.  NEUROLOGIC: No numbness, tingling. No CVAs.  PSYCHIATRIC: Positive severe depression with suicidal attempts. The patient states he is no longer suicidal.   PAST MEDICAL HISTORY: 1.  Severe depression.  2.  Multiple suicidal attempts with insulin overdose.  3.  Severe hypoglycemia.  4.  Hypertension.  5.  GERD.  6.  Alcohol dependence.  7.  Tobacco abuse.  8.  Depression and anxiety.   PAST SURGICAL HISTORY: No surgical history.   ALLERGIES: No known drug allergies.   SOCIAL HISTORY: The patient smokes about 1 pack a day and drinks at least a 6 pack a day. No IV drug use.   FAMILY HISTORY: Positive for diabetes and coronary artery disease in his father.   MEDICATIONS: Trazodone 50 mg 0.5 once a day, Topamax 50 mg twice daily, Seroquel 200 mg once a day, Nexium 40 mg 2 times daily.   We  are going to hold his medications for now as the patient is unstable.   PHYSICAL EXAMINATION: VITAL SIGNS: Blood pressure 145/74, pulse 81, respiratory rate 18, temperature 98.6, oxygen saturation 100% on room air.  GENERAL: The patient is alert and oriented x 3, in no acute distress. No respiratory distress. Hemodynamically stable.  HEENT: Pupils are equal and reactive. Extraocular movements are intact. Mucosa is moist. Anicteric sclerae. Pink conjunctivae. No oral lesions. No oropharyngeal exudates.  NECK: Supple. No JVD. No thyromegaly. No adenopathy. No  carotid bruits.  CARDIOVASCULAR: Regular rate and rhythm. No murmurs, rubs or gallops.  LUNGS: Clear without any wheezing or crepitus. No use of accessory muscles.  ABDOMEN: Soft, nontender, nondistended. No hepatosplenomegaly. No masses. Positive tenderness to palpation over right rib cage at the level of 8 to 10 ribs. No significant bruising.  MUSCULOSKELETAL: No pain to palpation of spinal processes. No joint effusions.  NEUROLOGIC: Cranial nerves II through XII intact. No focal findings.  PSYCHIATRIC: Flat affect, but no suicidal or homicidal ideations at this moment.  LYMPHATIC: Negative for lymphadenopathy in the neck or supraclavicular areas.  SKIN: No rashes or petechiae.   LABORATORY RESULTS: As mentioned above, blood sugar 24, 73, 59, 50, 111, 159.   Creatinine 0.93, potassium 3.7. LFTs within normal limits. White count 4.2, hemoglobin 13, platelet count 234. Pending chest x-ray. Salicylate level actually is positive. The patient denies any salicylate overdose. We are going to check levels back in the next 4 hours. He is not acidotic. There are no significant respiratory problems.     ASSESSMENT AND PLAN: A 39 year old gentleman with multiple suicidal attempts with insulin overdose. At this moment, he took Lantus, again, Lantus pen 1500 units. He is at severe danger of death and coma due to his critical state.  His blood sugar has been very resistant.   He is on D10 infusion at 250 an hour, and his blood sugar is still dropping into the 50s, for which we are going to start him on a glucagon drip. Glucagon drip is not common thing to do on hypoglycemia scenarios, mostly it is for beta blocker intoxication, but in the case of the patient being  at risk of death and diabetic coma, we are going to proceed in starting that, and monitor closely. He is going to go into the CCU. He is going to have blood sugars checked every hour if they are above 100. If they are below 100, we are going to start  checking every 20 minutes and add on D50 and glucagon pulses of 1 mg IV as necessary.   The patient, at this moment, is stable, but he has a potential risk of getting really sick and to have potential complications such as cardiac suppression due to the hypoglycemia, hypoglycemic coma. As far as his depression, he is now going to be on suicidal watch. I am going to hold all of his medications for now until he is evaluated by psychiatry again. He has elevated salicylate levels, which could be a cross reaction. He says that he has not taken any aspirin or any other NSAIDs. We are going to recheck his levels in 4 hours.  Hypertension. Hold medications.   Other medical problems are stable. Continue PPI for GI prophylaxis, heparin for DVT prophylaxis. The patient needs IV access, for which we are going to put in a central line, likely on his groin.   Critical care time of 50 minutes without counting for procedures. He actually is a  drinker. CIWA protocol has been started, as the patient is shaking, and he is a smoker. Smoking cessation counseling given for 3 minutes. Takes   CRITICAL CARE TIME:  50 minutes.    ____________________________ Felipa Furnaceoberto Sanchez Gutierrez, MD rsg:dmm D: 10/15/2013 21:59:55 ET T: 10/15/2013 22:16:26 ET JOB#: [id number]  cc: Felipa Furnaceoberto Sanchez Gutierrez, MD, <Dictator> Lovada Barwick Juanda ChanceSANCHEZ GUTIERRE MD ELECTRONICALLY SIGNED 10/18/2013 15:31

## 2015-03-13 NOTE — Consult Note (Signed)
Brief Consult Note: Diagnosis: Borderline personality disorder, Mood disorder NOS, Alcohol dependence.   Patient was seen by consultant.   Consult note dictated.   Recommend further assessment or treatment.   Discussed with Attending MD.   Comments: [name] following discharge after another suicide attempt. His GF left him due to drinking, about 12-18 beers a day. BAL none yesterday but it was 1.2 on 9/13 at previous admission.   PLAN: 1. The patient is on IVC, Corporate investment bankerContinue sitter.   2. He agreed to alcohol rehab at [facility]. Bed available on Monday. Pt needs to be transported by the sherriff.  3. Please continue CIWA.  4. Psychiatry will follow.  Electronic Signatures: Kristine LineaPucilowska, Steaven Wholey (MD)  (Signed 19-Sep-14 13:08)  Authored: Brief Consult Note   Last Updated: 19-Sep-14 13:08 by Kristine LineaPucilowska, Zanaria Morell (MD)

## 2015-03-13 NOTE — H&P (Signed)
PATIENT NAME:  [name], [name] MR#:  [id number] DATE OF BIRTH:  [birth year]  DATE OF ADMISSION:  12/03/2012  PRIMARY CARE PHYSICIAN: [facility] in [place].   REFERRING PHYSICIAN: Dr. Dorothea Glassman.   CHIEF COMPLAINT: Insulin overdose in suicidal attempt.   HISTORY OF PRESENT ILLNESS: The patient is a 39 year old Caucasian male with history of diabetes mellitus on insulin. He was just admitted to this hospital on January 7th this month and discharged on January 9th after had drug overdose with insulin in suicidal attempt. He had psychiatric evaluation at that time. The patient did it again. He claimed he took 1200 units of NovoLog insulin around 9:30 to 10:00 p.m. Then he notified his roommate that he did that. His roommate called the EMS and he was transported to this hospital. Here, the patient was noted to be hypoglycemic and this necessitated using dextrose 10% which was escalated to 100 mL per hour, then 200 mL per hour to maintain his blood sugar. The lowest blood sugar noted was 29 and that was at around 11:30 last midnight. Subsequent blood sugar was 157, 99, 86, 70, 53, 96, 64, then above 90, and even above 100. The patient is now in the process to be admitted to the hospital for further monitoring. He was seen already by Psychiatry and he was committed to be admitted. The patient indicates that he did that because he is tired of life. He wanted to commit suicide for many reasons, but one of them is that his girlfriend left him.   REVIEW OF SYSTEMS: The patient is very short in his answers and I have to repeat the questions to extract answers.  CONSTITUTIONAL: Denies having any fever, no chills, no fatigue.  EYES: No blurring of vision, no double vision.  ENT: No hearing impairment, no sore throat, no dysphagia.  CARDIOVASCULAR: No chest pain, no shortness of breath, no edema, no syncope.  RESPIRATORY: No cough. No shortness of breath, no hemoptysis, no chest pain.  GASTROINTESTINAL: No  abdominal pain, no vomiting, no diarrhea.  GENITOURINARY: No dysuria, no frequency of urination.  MUSCULOSKELETAL: No joint pain or swelling. No muscular pain or swelling.  INTEGUMENTARY: No skin rash, no ulcers.  NEUROLOGY: No focal weakness, no seizure activity, no headache.  PSYCHIATRY: No anxiety, but he has depression.  ENDOCRINE: No polyuria or polydipsia. No heat or cold intolerance.  HEMATOLOGY: No easy bruisability. No lymph node enlargement.   PAST MEDICAL HISTORY: He was just admitted on January 7th and discharged January 9th with insulin overdose, suicidal attempt, and hypoglycemia. His other medical problems include diabetes mellitus type 1 on insulin, systemic hypertension, alcohol abuse, and tobacco abuse.   FAMILY HISTORY: His father has diabetes and also there is a family history of depression.   SOCIAL HISTORY: He lives with his father in a trailer.   SOCIAL HABITS: Chronic smoker one pack per day since the age of 1. He drinks beer he states ranging between 2 packs to 12 packs a day. No other drug abuse.   ADMISSION MEDICATIONS: Norvasc 10 mg a day, enalapril 20 mg a day, clonidine 0.1 mg q.3 hours p.r.n. for hypertension, Nexium 40 mg a day, thiamine 100 mg a day, folic acid 1 mg a day, he is using FlexPen using NovoLog.  ALLERGIES: CODEINE CAUSING NAUSEA AND VOMITING, MOTRIN CAUSING GI SIDE EFFECTS, ASPIRIN CAUSING NAUSEA, VOMITING, AND DIARRHEA, PENICILLIN, AND TRAMADOL.  PHYSICAL EXAMINATION:  VITAL SIGNS: Blood pressure 160/85, respiratory rate 20, pulse 77, temperature 98.4, oxygen  saturation 96%.  GENERAL APPEARANCE: Young male sitting on the bed watching TV in no acute distress.  HEAD AND NECK: No pallor, no icterus, no cyanosis.  ENT: Examination revealed normal hearing, no discharge, no lesions, no ulcers. Nasal mucosa was normal without ulcers. No bleeding. No discharge. Oropharyngeal area was normal. He is edentulous despite his age. No oral thrush.  EYES:  Examination revealed normal iris and conjunctivae. Pupils about 6 mm, equal, and reactive to light. They are round and circular.  NECK: Supple. Trachea at midline. No thyromegaly, no cervical lymphadenopathy, no masses.  HEART: Normal S1, S2. No S3, S4. No murmur, no gallop, no carotid bruits.  RESPIRATORY: Examination revealed normal breathing pattern without use of accessory muscles. No rales, no wheezing.  ABDOMEN: Soft without tenderness. No hepatosplenomegaly, no masses, no hernias.  SKIN: No ulcers, no subcutaneous nodules.  MUSCULOSKELETAL: No joint swelling, no clubbing.  NEUROLOGIC: Cranial nerves II through XII were intact. No focal motor deficit.  PSYCHIATRIC: The patient is alert, oriented to the place, date, and people. Mood appears somewhat depressed.   LABORATORY FINDINGS: Chest x-ray showed no acute cardiopulmonary abnormality. His blood sugar is as above. Four blood sugars were 109, 119, 97, and 99. The rest of his chemistry showed BUN of 5, creatinine 0.7, sodium 140, potassium 3.2, his calcium 8.4. Alcohol level was 0.07. Normal liver function test. Slight elevation of AST at 43. Drug screen was negative. CBC showed a white count of 4,000, hemoglobin 14, hematocrit 43, platelet count 205. Urinalysis was unremarkable. Acetaminophen level less than 2, salicylate 2.6.   ASSESSMENT:  1. Drug overdose with insulin taking 4 of the FlexPen of NovoLog, each has 300, so he took a total of 1,200 units.  2. Suicidal attempt secondary to depression.  3. Insulin-dependent diabetes mellitus.  4. Systemic hypertension.  5. Hypoglycemia.  6. Alcohol and tobacco abuse.  7. Multiple admissions with suicidal attempts.   PLAN: The patient will be admitted to the intensive care unit. He is right now on dextrose 10% on the frequent monitoring of blood sugar. Will continue that. If stabilized, right now it is done every 10 minutes, will make it every half an hour x 2. If this is stable, then we will  check the blood sugar every one hour. If it is stable, will cut down the IV infusion. So far, it had been more than 4 hours since his overdose. I will continue his blood pressure medications. Psychiatry was involved and he was already seen. He is involuntarily committed.   Time spent in evaluating this patient and reviewing his extensive medical records took more than 55 minutes.     ____________________________ Carney CornersAmir M. Rudene Rearwish, MD amd:es D: 12/03/2012 03:48:34 ET T: 12/03/2012 09:25:53 ET JOB#: [id number]  cc: Carney CornersAmir M. Rudene Rearwish, MD, <Dictator> Zollie ScaleAMIR M Doreen Garretson MD ELECTRONICALLY SIGNED 12/04/2012 7:19

## 2015-03-13 NOTE — Discharge Summary (Signed)
PATIENT NAME:  [name], [name] MR#:  [id number] DATE OF BIRTH:  03-08-1976  DATE OF ADMISSION:  08/03/2013 DATE OF DISCHARGE:  08/06/2013   PRIMARY CARE PHYSICIAN: At [facility] in [place].  Per chart, here it is stated that his PCP is Dr. Zada Finders, but when I called Dr. Zada Finders, he has not seen the patient for about a year and a half or so.     CHIEF COMPLAINT: Hypoglycemia in the setting of Lantus overdose.   DISCHARGE DIAGNOSES: 1.  Hypoglycemia in the setting of Lantus overdose.  2.  Suicidal ideation and intent now resolved.  3.  Depression.  4.  No diagnosis of diabetes.  5.  Tobacco abuse.  6.  Alcohol abuse.  7.  History of repeated suicidal attempts with insulin overdose.   DISCHARGE MEDICATIONS: Amlodipine 10 mg daily, enalapril 20 mg daily, Prilosec OTC 1 tab once a day, clonazepam 0.5 mg every 12 hours as needed for anxiety, Neurontin 400 mg 3 times a day, Seroquel 50 mg 3 times a day and 400 mg at bedtime, Prozac 40 and 20 mg tabs once a day, Librium taper 5 mg 4 caps 2 times a day, then 3 caps 2 times a day for a day, than 2 caps 2 times a day for a day and 1 cap 2 times a day for a day then stop.   DIET: Low sodium.   ACTIVITY: As tolerated.   FOLLOWUP: Please follow with PCP within 1 to 2 weeks at [facility] primary care. Please follow with a psychiatrist within 1 to 2 weeks.   DISPOSITION: Home.   HISTORY OF PRESENT ILLNESS AND HOSPITAL COURSE: For full details of H and P please see the dictation of September 13 by Dr. Elpidio Anis but briefly this is a 39 year old Caucasian male with depression, suicidal attempts and ideation in the past who injected high doses of insulin with. Apparently, per H and he injected about 1500 units of Lantus and 300 units of NovoLog and was admitted to the hospitalist service after breaking up with his girlfriend. He has done this in the past multiple times. He does not have diabetes. When looking back at multiple admissions the hemoglobin A1c's are  mostly if not all in the 5 range. He was admitted to the CCU on dextrose drip. He also needed aggressive IV fluid resuscitation after experiencing hypotensive episodes, likely secondary to hypoglycemia. He was also started with a sitter and initially was committed.  He was seen by Dr. Guss Bunde over the weekend from psychiatry.  His hypoglycemia improved.  The sitter was dismissed by Psychiatry. He has no suicidal ideation or intent now. He had been drinking and he did have alcohol on board as well and he was placed on CIWA protocol and also Librium standing. He will be getting discharged with a Librium taper. I did call [facility] and discussed with Dr. Zada Finders who stated that he has not seen him for a while, but I did stress that per his history, he does not appear to have diabetes and should not be given insulin and this chart will be flagged at [facility]. He does not appear to be in any alcohol withdrawal. He does smoke tobacco and he was counseled for 3 minutes by me today.  On the day of discharge, he does not have any tremors. He is calm.   PHYSICAL EXAMINATION: VITAL SIGNS:  No fever. Pulse is 83, respiratory rate 20, blood pressure 116/70. O2 sat is 92% on room  air.  GENERAL: He is an obese male, sitting on the bed eating without distress, calm.  HEENT:  Normocephalic, atraumatic. Pupils are equal.  LUNGS:  Examination is clear.  ABDOMEN: Benign. No tenderness. Positive bowel sounds.  EXTREMITIES: Show no lower extremity edema.  HEART:  He has regular S1, S2.   He will be discharged without metformin or NovoLog that he came in on. Again, he does not have diabetes and should not be placed on insulin.   TOTAL TIME SPENT: 35 minutes.   CODE STATUS: The patient is FULL CODE.   ____________________________ Krystal EatonShayiq Allison Deshotels, MD sa:dp D: 08/06/2013 13:36:18 ET T: 08/06/2013 14:26:02 ET JOB#: [id number]  cc: Krystal EatonShayiq Triston Lisanti, MD, <Dictator> Krystal EatonSHAYIQ Tirth Cothron MD ELECTRONICALLY SIGNED 08/13/2013 14:07

## 2015-03-13 NOTE — H&P (Signed)
PATIENT NAME:  [name], KERSTETTER MR#:  [id number] DATE OF BIRTH:  1976/10/05  DATE OF ADMISSION:  09/10/2013  PRIMARY CARE PHYSICIAN: None.   CHIEF COMPLAINT:  "I injected 5 vitals of NovoLog insulin."   HISTORY OF PRESENT ILLNESS: This is a 39 year old man with multiple hospitalizations in the past for suicide attempt with injection of insulin. Today, he stated he was feeling a little bit depressed and injected 5 vials of NovoLog pen insulin that is 40 years old. He states it is his father's medication that he had in his trailer. After he injected the insulin, he called EMS. He was being observed in the ER for a period of time. His sugars were okay, then he had a low sugar at 1536. The first ER physician was following him and was just going to watch him in the ER; the second ER physician that took over the case saw this low sugar and wanted a medical admission. The patient was already seen by the psychiatrist, Dr. Garnetta Buddy, who already involuntarily committed him.   PAST MEDICAL HISTORY: Diverticulitis, gastroesophageal reflux disease, hypertension, depression, anxiety, alcohol abuse.   PAST SURGICAL HISTORY: None.   ALLERGIES: No known drug allergies   MEDICATIONS: As per Prescription Writer include amlodipine 10 mg daily, fluoxetine 20 mg daily, lisinopril 30 mg daily, naltrexone 50 mg daily, Topamax 50 mg twice a day, trazodone 25 mg at bedtime.   SOCIAL HISTORY: He smokes 1 pack per day. Positive alcohol, 12 pack per day. No drug use. Currently on disability.   FAMILY HISTORY: Father is 31 with diabetes. Mother died of an MI at age 70.   REVIEW OF SYSTEMS: CONSTITUTIONAL: Positive for cold sweats. Positive for fatigue.  EYES: No blurry vision.  EARS, NOSE, MOUTH  AND THROAT: Positive for sore throat, dysphagia.  CARDIOVASCULAR: No chest pain. No palpitations.  RESPIRATORY: No shortness of breath. No coughing. No sputum. No hemoptysis.  GASTROINTESTINAL: No nausea. No vomiting. No abdominal  pain. No diarrhea. No constipation. No bright red blood per rectum. No melena.  GENITOURINARY: No burning on urination. No hematuria.  MUSCULOSKELETAL: No joint pain or muscle pain.  INTEGUMENT: No rashes or eruptions.  NEUROLOGIC: No fainting or blackouts.  PSYCHIATRIC: Positive for anxiety and depression.  ENDOCRINE: No thyroid problems.  HEMATOLOGIC AND LYMPHATIC: No anemia. No easy bruising or bleeding.   PHYSICAL EXAMINATION: VITAL SIGNS: Pulse 118, respirations 20, blood pressure 140/80, pulse ox 97% on room air.  GENERAL: No respiratory distress.  EYES: Conjunctivae and lids normal. Pupils equal, round and reactive to light. Extraocular muscles intact. No nystagmus.  EARS, NOSE, MOUTH AND THROAT: Tympanic membranes: No erythema. Nasal mucosa: No erythema. Throat: No erythema. No exudate seen. Lips and gums: No lesions.  NECK: No JVD. No bruits. No lymphadenopathy. No thyromegaly. No thyroid nodules palpated.  LUNGS: Clear to auscultation. No use of accessory muscles to breathe. No rhonchi, rales or wheeze heard.  CARDIOVASCULAR: S1, S2 normal. A 2/6 systolic ejection murmur. Carotid upstroke 2+ bilaterally. No bruits. Dorsalis  pedis pulses 2+ bilaterally.  ABDOMEN: Soft, nontender. No organomegaly/splenomegaly. Normoactive bowel sounds. No masses felt.  LYMPHATIC: No lymph nodes in the neck.  MUSCULOSKELETAL: No clubbing. No edema. No cyanosis.  SKIN: No ulcers or lesions seen.  NEUROLOGIC: Cranial nerves II through XII grossly intact. Deep tendon reflexes 2+ bilateral lower extremities.  PSYCHIATRIC: The patient is oriented to person, place and time.   LABORATORY AND RADIOLOGICAL DATA: Initial glucose 106. TSH 0.80. Salicylates less than 1.7.  White blood cell count 6.2, H and H 15.7 and 46.0, platelet count of 199. Ethanol level 64. Glucose 98, BUN 3, creatinine 0.95, sodium 137, potassium 3.7, chloride 104, CO2 of 21. Liver function tests: AST slightly elevated at 38. Acetaminophen  first was 54. Urinalysis 150 mg/dL of glucose, 30 mg/dL of protein. Urine toxicology positive for phencyclidine and TCA. Glucose at 1536 was 42. The next glucose was 114, and the next acetaminophen level was 15.   ASSESSMENT AND PLAN: 1,  Hypoglycemia secondary to insulin overdose. The patient is not a diabetic. The patient is on D5 drip. I will decrease that rate to 100 mL per hour. I will check fingersticks q.1, and I will not change that until the patient is off the medicine service.  2.  Suicide attempt. The patient seen in consultation by Dr. Garnetta BuddyFaheem, psychiatry. The strange thing about this case is every time he injects insulin, he then makes the phone called to EMS to be brought to the hospital. This does not make sense if you are actually trying to kill yourself. The patient will need psychiatric followup. The patient is involuntarily committed.  3.  Hypertension. Will continue lisinopril and Norvasc at this time.  4.  Gastroesophageal reflux disease. Continue Nexium.  5.  Alcohol abuse. Will put on CIWA protocol. Ethanol level when he came in was 63, so I do not think he is going to go through withdrawal today.   I will admit this patient as an observation.   TIME SPENT ON ADMISSION: 50 minutes.    ____________________________ Herschell Dimesichard J. Renae GlossWieting, MD rjw:dmm D: 09/10/2013 18:49:39 ET T: 09/10/2013 19:25:58 ET JOB#: [id number]  cc: Herschell Dimesichard J. Renae GlossWieting, MD, <Dictator> Salley ScarletICHARD J Bryana Froemming MD ELECTRONICALLY SIGNED 09/16/2013 11:31

## 2015-03-13 NOTE — Consult Note (Signed)
Brief Consult Note: Diagnosis: borderline personality disorder.   Patient was seen by consultant.   Consult note dictated.   Comments: Psychiatry: Full note done. Patient well known for this behavior. Has ACT services in community. Currently patient denies any suicidal ideation. Will request his ACT team be contacted. No change to meds right now.  Electronic Signatures: Audery Amellapacs, Meridith Romick T (MD)  (Signed [id number]-Nov-14 09:43)  Authored: Brief Consult Note   Last Updated: 26-Nov-14 09:43 by Audery Amellapacs, Rayman Petrosian T (MD)

## 2015-03-13 NOTE — Consult Note (Signed)
PATIENT NAMEJEOFFREY, [name] [id number] OF BIRTH:  10/24/1976 OF ADMISSION:  01/13/2014OF CONSULTATION:  12/03/2012 PHYSICIAN:  Dr. Karolee Ohs DarwishPHYSICIAN:  Kristine Linea, M.D.  FOR A CONSULTATION: To evaluate a patient after a habitual suicide attempt by insulin overdose.  DATA:  [name] is a 39 year old male with history of depression, alcoholism and multiple suicide attempts.  COMPLAINT:  The patient unable to state.  OF PRESENT ILLNESS:  The patient was admitted to the medical floor after another overdose on 1300 units of insulin. He was just discharged from [facility] psychiatry after a similar attempt on 11/27/2012. Previously he denied suicidal intent but lately admits freely that he has been suicidal since his girlfriend left him over the summer. It is still unclear how he gets such massive amounts of insulin. It has not been prescribed in the hospital. He tells me that this is his daddy's, who is in SNF for rehab, insulin. I know that one with established history of diabetes can get more of insulin at [facility] without current prescription. It is also interesting that he can get to the hospital in time to be saved after such serious overdoses. The patient endorses many symptoms of depression with poor sleep, decreased appetite, anhedonia, feeling of guilt, hopelessness, worthlessness, social isolation, heightened anxiety, poor memory and concentration and lack of energy. During previous hospitalization the patient behaved poorly. He was upset and told me that his father just passed away. He was crying in despair, hitting walls, demanding Ativan. The patient admits to escalating drinking, over 12-pack a day.  There is a history of alcoholism, and the patient was referred to [facility] in the summer of last year. He completed treatment at [facility] and was able to maintain sobriety for 3 or 4 weeks. He reports heightened anxiety with panic attacks. He denies psychotic symptoms. There are no symptoms suggestive of  bipolar mania. He denies illicit drugs or prescription pill abuse.  PSYCHIATRIC HISTORY: He has been hospitalized multiple times. His first hospitalization as a child was at [facility]. He was kept in the hospital for almost 2 years. This was following his parents' divorce. He has been hospitalized here several times on medical floor for "accidental" insulin overdose, often brought to psychiatry following an overdose. He has been tried on numerous medications including Prozac, Tegretol, lithium, Valium and Zoloft. He has not been taking any medications recently.  PSYCHIATRIC HISTORY:  None reported.  MEDICAL HISTORY:  Hypertension, diabetes, GERD, asthma, alcoholic hepatitis, history of pseudoseizures.  ASPIRIN, CODEINE, MOTRIN, PENICILLIN, TRAMADOL.  ON ADMISSION:  Thiamine 100 mg daily. Norvasc 10 mg daily. Nexium 40 mg daily. Folic acid 1 mg daily. Enalapril 20 mg daily. Clonidine 0.1 mg every 3 hours as needed for hypertension. insulin was prescribed at the time of last or previous visit. It is unclear how he obtains it.  HISTORY:   He lives with his father in a trailer, if he can be trusted. The father is currently at the skilled nursing facility rehabilitating.  He has Medicare and Medicaid.  He has a history of drinking and treatment noncompliance.    OF SYSTEMS:  CONSTITUTIONAL:  No fevers or chills. No weight changes. EYES:  No double or blurred vision. ENT:  No hearing loss. RESPIRATORY:  No shortness of breath or cough. CARDIOVASCULAR:  No chest pain or orthopnea. GASTROINTESTINAL:  No abdominal pain, nausea, vomiting or diarrhea. GENITOURINARY:  No incontinence or frequency. ENDOCRINE:  No heat or cold intolerance. LYMPHATIC:  No anemia or easy  bruising. INTEGUMENTARY:  No acne or rash. MUSCULOSKELETAL:  Positive for bruises on both hands from hitting the wall. NEUROLOGIC:  No tingling or weakness. PSYCHIATRIC:  See history of present illness for details.  EXAMINATION: VITAL SIGNS:   Blood pressure 138/84, pulse 84, respirations 18, temperature 96.8. GENERAL:  This is a slightly obese, unkept male, in no acute distress. HEENT:  The pupils are equal, round and reactive to light. Sclerae anicteric. NECK:  Supple. No thyromegaly. LUNGS:  Clear to auscultation. No dullness to percussion. HEART:  Regular rhythm and rate. No murmurs, rubs or gallops. ABDOMEN:  Soft, nontender, nondistended. Positive bowel sounds. MUSCULOSKELETAL:  Normal muscle strength in all extremities. SKIN:  No rashes. Positive for scratches and bruises on both hands. LYMPHATIC:  No cervical adenopathy. NEUROLOGIC:  Cranial nerves II through XII are intact.  AND LABORATORY DATA:  Chemistries are within normal limits except for K 3.2 blood glucose of 182. LFTs within normal limits except for AST of 4349. Blood alcohol level on admission 0.0772. TSH 2.09. Tegretol level less than 0.5. Urine tox screen negative for substances. CBC within normal limits.  Urinalysis is not suggestive of urinary tract infection.  EKG: Normal sinus rhythm, prolonged QT, abnormal EKG.  STATUS EXAMINATION ON ADMISSION: The patient is alert and oriented to person, place, time and somewhat to situation. He is irritable, unpleasant and unbadly cooperative. There is some psychomotor agitation. He has been hitting the wall on the medical floor, as well as here, in his mind because of losing his father. He is unkept. He is wearing a hospital gown. There is minimal eye contact. His speech is soft. Mood is depressed with labile affect. Thought processing is slow. Thought content: He denies suicidal or homicidal ideation, but was admitted to the hospital after a serious overdose on insulin. There are no delusions or paranoia. There are no auditory or visual hallucinations. His cognition is difficult to assess, as the patient is unwilling to participate. His insight and judgment are extremely poor.  RISK ASSESSMENT ON ADMISSION:  This is a patient with a long  history of mood instability and multiple suicide attempts by insulin overdose. He admits to being severely depressed and suicide.  DIAGNOSES: I:  Mood disorder, not otherwise specified.   Alcohol dependence.   Cocaine abuse.   Benzodiazepine dependence. II:   Personality disorder, not otherwise specified. III:  Hypertension, diabetes, gastroesophageal reflux disease, alcoholic hepatitis, pseudoseizures. IV:   Mental illness, substance abuse, primary support, family conflict. V:    Global Assessment of Functioning 25.    Will transfer to psychiatry once medically stable.  He does not require CIWA. I will follow up.      Electronic Signatures: Kristine LineaPucilowska, Ledon Weihe (MD) (Signed on 17-Jan-14 12:47)  Authored   Last Updated: 17-Jan-14 20:12 by Kristine LineaPucilowska, Tonjia Parillo (MD)

## 2015-03-13 NOTE — Discharge Summary (Signed)
PATIENT NAME:  [name], [name] MR#:  [id number] DATE OF BIRTH:  05/29/1976  DATE OF ADMISSION:  10/24/2013 DATE OF DISCHARGE:  10/28/2013  DISCHARGE DIAGNOSES: 1.  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 2.  Depression.  3.  Chronic pain syndrome.  4.  Noncompliance.   CONSULTANTS: Dr. Mordecai RasmussenJohn Clapacs with psychiatry.   ADMITTING HISTORY AND PHYSICAL AND HOSPITAL COURSE: Please see detailed H and P dictated previously by Dr. Sherryll BurgerShah. In brief, a 39 year old Caucasian male patient with multiple admissions in the past with insulin overdose who presented to the Emergency Room after taking insulin subcutaneous Levemir which belonged to his dad. The patient has been under watchful eye of Adult Protective Services, but in spite of this the patient presented to the ER. He was started on D10, admitted to the hospitalist service. Slowly the patient was weaned off the D10 and his blood sugars are normal at 112. The patient was seen by Dr. Toni Amendlapacs who suggested the patient has a personality disorder with histrionic personality. No suicidal behavior. Will be discharged back home with Adult Protective Services. I have discussed the case with case Production designer, theatre/television/filmmanager and Child psychotherapistsocial worker.   Today on examination, the patient does not have any abdominal tenderness. S1 and S2 without any murmurs.   DISCHARGE MEDICATIONS: Include:  1.  Nexium 40 mg 2 times a day.  2.  Topiramate 50 mg oral 2 times a day.  3.  Seroquel 200 mg oral once a day. 4.  Trazodone 50 mg oral 1/2 tablet once a day as needed for insomnia.  5.  Loperamide 2 mg oral 4 times a day as needed for diarrhea.  6.  Wellbutrin 300 mg oral once a day.   DISCHARGE INSTRUCTIONS: Regular diet, regular consistency. The patient has been counseled extensively to use medications only as prescribed and not take any further insulin. Follow up with primary care physician in a week.   TOTAL TIME SPENT: On day of discharge in discharge activity was 40  minutes. ____________________________ Molinda BailiffSrikar R. Johnica Armwood, MD srs:sb [name]: 10/28/2013 14:07:00 ET T: 10/28/2013 15:56:57 ET JOB#: [id number]  cc: Wardell HeathSrikar R. Nickie Deren, MD, <Dictator> Adult Protective Services Orie FishermanSRIKAR R Gail Creekmore MD ELECTRONICALLY SIGNED 10/30/2013 14:09

## 2015-03-13 NOTE — Consult Note (Signed)
Brief Consult Note: Diagnosis: Bipolar Mixed,  Alcohol dependence.   Patient was seen by consultant.   Consult note dictated.   Recommend further assessment or treatment.   Discussed with Attending MD.   Comments: Pt seen in ED after he  overdose on Insulin. He is depressed and has poor insight. Was drinking as well. he will be admitted to medical floor for stabilization at this time.   PLAN: 1. The patient is on IVC, Corporate investment bankerContinue sitter.  Consult note dictated.   Psychiatry will follow.  Electronic Signatures: Rhunette CroftFaheem, Eimy Plaza S (MD)  (Signed 21-Oct-14 16:11)  Authored: Brief Consult Note   Last Updated: 21-Oct-14 16:11 by Rhunette CroftFaheem, Summerlynn Glauser S (MD)

## 2015-03-13 NOTE — Consult Note (Signed)
Brief Consult Note: Diagnosis: Borderline personality disorder, Mood disorder NOS, Alcohol dependence.   Patient was seen by consultant.   Consult note dictated.   Recommend further assessment or treatment.   Discussed with Attending MD.   Comments: [name] is well known to us. He was hospitalized in psychiatry multiple times. Following last hospitalization he weas discharged to [facility] facility. He left there on Monday 2/17. He started drinking immediately and resolve to OD on Insulin Lantus belonging to his father and Tegretol. He was admitted to CCU. He is on IVC and has a Comptrollersitter as there is a h/o overdose while in the hospital.  Spoke with Dr. Sherryll BurgerShah this am. [facility] The patient seems much better today. Sugars are stable. He thinks he will be dischsarged soon. He asks to d/c sitter and contracts for safety.   MSE: Alert, oriented, pleasant, cooperative, normal cognition, denies safety problems. Denies psychotic symptoms.   PLAN: 1. Given his history, please continue IVC and sitter.  2. No psychotropic medications indicated as the patient is sedated. He never takes any medications at home or follows up with his providers.   3. The patient showed over again that he does not benefit from psychiatric or substance abuse hospitalizations. We will transfer to psychiatry when medically cleared.   4. I will follow up.  Electronic Signatures: Kristine LineaPucilowska, Olimpia Tinch (MD)  (Signed [phone]-Feb-14 08:04)  Authored: Brief Consult Note   Last Updated: 25-Feb-14 08:04 by Kristine LineaPucilowska, Nataliah Hatlestad (MD)

## 2015-03-13 NOTE — Discharge Summary (Signed)
PATIENT NAME:  [name], [name] MR#:  [id number] DATE OF BIRTH:  1976-10-25  DATE OF ADMISSION:  09/13/2013 DATE OF DISCHARGE:  09/20/2013  FINAL DIAGNOSES: 1.  Hypoglycemia, in the setting of insulin overdose.  2.  Suicidal ideation.  3.  Mood disorder.  4.  Alcohol dependence.  5.  Borderline personality disorder.  6.  Hypertension.  7.  Tobacco abuse.   DISCHARGE MEDICATIONS: Amlodipine 10 mg daily, clonazepam 1 mg 2 times a day, Nexium 40 mg daily, quetiapine 200 mg at bedtime.   DIET: Low-sodium.   ACTIVITY: As tolerated.   FOLLOWUP: Please follow with PCP and psychiatrist within 1 to 2 week and follow with ACT program as scheduled for you.   The patient was seen by Dr. Jennet MaduroPucilowska from psychiatry, as the ACT program has been set up for him and he was deemed not suicidal by psychiatry any longer. He was discharged on the same day that the interim was dictated.  He was informed of outpatient services by the social worker, as well as psychiatry and me.   Total Time Spent: 10 minutes extra.  CODE STATUS: The patient is full code   ____________________________ Krystal EatonShayiq Cheyenne Bordeaux, MD sa:NTS D: 09/21/2013 18:52:48 ET T: 09/22/2013 00:43:39 ET JOB#: [id number]  cc: Krystal EatonShayiq Ronald Vinsant, MD, <Dictator> Krystal EatonSHAYIQ Demetric Parslow MD ELECTRONICALLY SIGNED 10/10/2013 3:17

## 2015-03-13 NOTE — H&P (Signed)
PATIENT NAME:  [name], [name] MR#:  [id number] DATE OF BIRTH:  1976/02/17  DATE OF ADMISSION:  10/24/2013  PRIMARY CARE PHYSICIAN: None.  REQUESTING PHYSICIAN: Dr. Margarita Grizzle.  CHIEF COMPLAINT: Insulin overdose with severe hypoglycemia.   HISTORY OF PRESENT ILLNESS: The patient is a 39 year old male with a known history of multiple admissions for insulin overdose with recent discharge on 27th of November, is being admitted for insulin overdose. The patient intentionally took 1800 units of insulin to commit suicide. He started feeling bad and called EMS. On arrival of EMS, sugar was 33, rechecking it was 37. He was given an amp of D50. Sugar increased to 286 and within a few minutes it dropped to 90. He was complaining of right rib pain from previous rib fracture and was asking for more pain medicine.   PAST MEDICAL HISTORY: 1.  Severe depression.  2.  Multiple suicidal attempts with insulin overdose. 3.  Severe hypoglycemia.  4.  Hypertension.  5.  GERD. 6.  Alcohol and tobacco dependence.  7.  Depression and anxiety.   PAST SURGICAL HISTORY: None.  ALLERGIES: No known drug allergies.   SOCIAL HISTORY: Smokes 1 pack of cigarettes daily and drinks at least a 6-pack a day. No IV drugs of abuse.   FAMILY HISTORY: Positive for diabetes and coronary artery disease in the father.   REVIEW OF SYSTEMS: CONSTITUTIONAL: No fever, fatigue, weakness.  EYES: No blurred or double vision.  ENT: No tinnitus or ear pain.  RESPIRATORY: No cough or hemoptysis.  CARDIOVASCULAR: No chest pain, orthopnea, or edema. Positive for chest tenderness and pain from recent rib fracture, what the patient claims, asking for more pain medicine. GASTROINTESTINAL: No nausea, vomiting, or diarrhea.  GENITOURINARY: No dysuria or hematuria.  ENDOCRINE: No polyuria or nocturia.  HEMATOLOGY: No anemia or easy bruising.  SKIN: No rash or lesion.  MUSCULOSKELETAL: No arthritis or muscular pain.  ENDOCRINE: No polyuria  or nocturia. Positive for recurrent hypoglycemia from intentional insulin overdose.  NEUROLOGIC: No tingling, numbness, or weakness.  PSYCHIATRIC: No history of anxiety. Positive for depression and multiple suicide attempts.   MEDICATIONS AT HOME: 1.  Acetaminophen/oxycodone 325/5 mg 1 tablet p.o. every 6 hours as needed.  2.  Nexium 40 mg p.o. b.i.d.  3.  Seroquel 200 mg p.o. at bedtime.  4.  Topiramate 50 mg p.o. b.i.d.  5.  Trazodone 25 mg p.o. at bedtime as needed.   PHYSICAL EXAMINATION: VITAL SIGNS: Temperature 98.3, heart rate 80 per minute, respirations 23 per minute, blood pressure 128/71 mmHg, and he is saturating 100% on room air. GENERAL: The patient is a 39 year old male lying in the bed comfortably without any acute distress.  EYES: Pupils equal, round, and reactive to light and accommodation. No scleral icterus. Extraocular muscles intact.  HENT: Head atraumatic, normocephalic. Oropharynx and nasopharynx clear.  NECK: Supple. No jugular venous distention. No thyroid enlargement or tenderness.  LUNGS: Clear to auscultation bilaterally. No wheezing, rales, rhonchi, or crepitation.  CARDIOVASCULAR: S1, S2 normal. No murmurs, rubs, or gallop.  ABDOMEN: Soft, nontender, nondistended. Bowel sounds present. No rebound or masses. He does have positive tenderness to palpation over the right rib cage at the level of 8 to 10 ribs. No significant bruising.  MUSCULOSKELETAL: No joint pain or effusion, except some rib pain.  NEUROLOGIC: Cranial nerves II through XII intact. Muscle strength 5/5 in all extremities. Sensation intact.  PSYCHIATRIC: The patient is alert and oriented x3. He has a flat affect, but no suicidal or  homicidal ideation at this moment.  SKIN: No obvious rash, lesion, or ulcer.   LABORATORY AND DIAGNOSTICS: Normal BMP except alcohol level was 0.294. Normal liver function test except alkaline phosphatase 148. Normal CBC except hematocrit of 38.5. Normal salicylate level  and Tylenol level.  EKG shows normal sinus rhythm, no major ST-T changes.   IMPRESSION AND PLAN: 1.  Intentional insulin overdose with multiple episodes of same in the past. Will start him on D10 infusion with blood sugar check every 2 hours for now until sugar consistently is staying above 120. Will hold his diabetic medicines and consider psychiatric consult, although at this point I think we have utilized most of the psychiatric resources. Ideally needs long-term psych placement at some inpatient facility.  2.  Gastroesophageal reflux disease. We will continue Nexium.  3.  Tobacco abuse. The patient was counseled for about 3 minutes. He is not willing to quit yet.  4.  Major depression. He is on Seroquel and trazodone. We will continue at this time.   CODE STATUS: FULL code.   TOTAL TIME TAKING CARE OF THIS PATIENT (CRITICAL CARE): 55 minutes.  ____________________________ Ellamae SiaVipul S. Sherryll BurgerShah, MD vss:sb D: 10/24/2013 15:47:36 ET T: 10/24/2013 16:17:59 ET JOB#: [id number]  cc: Danicia Terhaar S. Sherryll BurgerShah, MD, <Dictator> Patricia PesaVIPUL S Bailei Buist MD ELECTRONICALLY SIGNED 10/25/2013 18:14

## 2015-03-13 NOTE — Consult Note (Signed)
Brief Consult Note: Diagnosis: personality disorder, polysubstance dependence.   Comments: Psychiatry: Consult received. Chart reviewed. PAtient very well known to us having just left Monday after a hospital stay of about a month. At the time he left he was happy, smiling, upbeat and expressing optimism about being back with his girlfriend. He had been stable and was completely denying and suicidal thoughts. As so often before it seems he started drinking and took a huge od of insulin. Patient has said to me several times when sober that this behavior is to "get attention", but obviously he is playing with a high risk of dying. We would like [facility] [place]. Will follow.  Electronic Signatures: Audery Amellapacs, Ceil Roderick T (MD)  (Signed 02-Apr-14 10:08)  Authored: Brief Consult Note   Last Updated: 02-Apr-14 10:08 by Audery Amellapacs, Niyanna Asch T (MD)

## 2015-03-13 NOTE — H&P (Signed)
PATIENT NAME:  [name], [name] MR#:  [id number] DATE OF BIRTH:  03-03-1976  PRIMARY CARE PHYSICIAN: None.  CHIEF COMPLAINT: "I injected all of the vial of Lantus and Humalog."  HISTORY OF PRESENT ILLNESS: A 39 year old male who was just discharged yesterday after hypoglycemia secondary to suicidal attempt with insulin who comes in again for the same reason. The patient took his father's full vial of Lantus and 250 units of regular insulin then called EMS, told them what he did, EMS arrived. His blood sugars were 40. He was sent  to the ER. He currently denies suicidal ideations. He was started on a D5 drip. His blood sugars are about 100 now on the D5 drip.   PAST MEDICAL HISTORY:  1.  Multiple hospitalizations for suicidal ideations and hypoglycemia secondary to this. 2.  GERD.  3.  Hypertension.  4.  Alcohol and tobacco dependence.  5.  Depression and anxiety.   PAST SURGICAL HISTORY: None.  ALLERGIES: No known drug allergies.   MEDICATIONS: The patient was discharged with a nicotine inhaler, Norvasc 10 mg daily.   SOCIAL HISTORY: The patient smokes about a pack a day and drinks a 12-pack of beer a day. No IV drug use.   FAMILY HISTORY: Diabetes and MI.   REVIEW OF SYSTEMS:  CONSTITUTIONAL: No fever, fatigue, weakness, weight loss, weight gain.  EYES: No blurry or double vision, glaucoma or cataracts.  ENT: No ear pain, hearing loss. Positive snoring. No epistaxis. Dentures.  RESPIRATORY: No cough, wheezing, hemoptysis, COPD. CARDIOVASCULAR: No chest pain, orthopnea, palpitations, edema.  GASTROINTESTINAL: No nausea or vomiting, diarrhea, abdominal pain, melena or ulcers.  GENITOURINARY: No dysuria, hematuria.  ENDOCRINE: No polyuria, polydipsia. HEMOLYMPHATIC: No anemia or easy bruising.  SKIN: No rash or lesions. MUSCULOSKELETAL: No limited activity.  NEUROLOGIC: No history of CVA, TIA, or seizures.  PSYCHIATRIC: Positive depression and anxiety and multiple suicidal ideations.    PHYSICAL EXAMINATION:  VITAL SIGNS: Temperature is 98.3. Pulse 117, respirations 18, blood pressure 164/78 99% on room air.  GENERAL: Alert, oriented, not in acute distress.  HEENT: Head is atraumatic. Pupils are reactive. Sclerae anicteric. Mucous membranes are dry.  OROPHARYNX: Clear without JVD, carotid bruit or enlarged thyroid.  CARDIOVASCULAR: Tachycardia. No murmur, gallop or rub. PMI is nondisplaced.  LUNGS: Clear to auscultation without crackles, rales, rhonchi or wheezing. Normal percussion. ABDOMEN: Bowel sounds are positive. Nontender, nondistended. Hard to appreciate organomegaly due to body habitus.  EXTREMITIES: No clubbing, cyanosis or edema.  BACK: No CVA or vertebral tenderness.  NEUROLOGIC: Cranial nerves II through XII are intact. There are no focal deficits.  SKIN: Without rash or lesions.   LABORATORY, DIAGNOSTIC AND RADIOLOGICAL DATA: Sodium 133, potassium 3.4, chloride 103, bicarb 22, BUN 4, creatinine 0.76, glucose 173, calcium 8.6, bilirubin 0.3, alk phos 83, ALT 55, AST 44, total protein 7.1. Alcohol level is 0.132. White blood cells 5, hemoglobin 13.6, hematocrit 41. Platelets are 162. TSH 1.09. Urine toxicology is negative. Urinalysis shows no LCE or  nitrites.   ASSESSMENT AND PLAN: A 39 year old male with numerous hospitalizations for hypoglycemia and suicidal ideation who presents again after suicidal ideation, taking his father's insulin.  1.  Hypoglycemia secondary to intentional overdose. The patient will be admitted to the hospitalist service. We will start D5 drip, monitor Accu-Cheks q.1 hour. The patient is not a diabetic.  2.  Suicidal ideations. The patient on involuntary commitment by the ER physician. Psychiatry has been consulted. A sitter has been ordered. Continue suicide precautions.  3.  Hypertension. Will continue with Norvasc.  4.  Alcohol dependence. The patient will be placed on a CIWA protocol.  5.  Tobacco dependence. The patient was  encouraged to stop smoking. He is not interested in quitting smoking at this time. He does want a nicotine patch.   CODE STATUS: FULL CODE.   The patient will need a PCP prior to discharge.   TIME SPENT: Approximately 50 minutes.    ____________________________ Donell Beers. Benjie Karvonen, MD spm:np D: 09/13/2013 19:13:13 ET T: 09/13/2013 20:23:32 ET JOB#: [id number]  cc: Latiya Navia P. Benjie Karvonen, MD, <Dictator> Rodina Pinales P Deandrew Hoecker MD ELECTRONICALLY SIGNED 09/14/2013 22:09

## 2015-03-13 NOTE — Discharge Summary (Signed)
PATIENT NAME:  [name], [name] MR#:  [id number] DATE OF BIRTH:  [birth year]  DATE OF ADMISSION:  01/18/2013 DATE OF DISCHARGE:  01/18/2013  DISCHARGE DIAGNOSES:  1.  Insulin overdose with intent to harm. Suicide attempt. 2.  Alcohol and tobacco abuse.  3.  Hypertension.  4.  Depression.  5.  Hypoglycemia, which was resolved.   SECONDARY DIAGNOSES:  1.  Recurrent admission (8 admissions in the last 2 to 3 months) with insulin overdose and hypoglycemia.  2.  Gastroesophageal reflux disease.   CONSULTATIONS: Psychiatry, Dr. Jennet MaduroPucilowska.   PROCEDURES AND RADIOLOGY: None.   MAJOR LABORATORY PANEL: Urinalysis on admission was negative. Serum acetaminophen and salicylate levels were within normal limits.   HISTORY AND SHORT HOSPITAL COURSE: The patient is a 39 year old male with the above-mentioned medical problems who was admitted one more time with a similar issue of insulin overdose with hypoglycemia. He has had 7 to 8 admissions in the last 2 to 3 months of similar etiology. Please see Dr. Riley Nearingarwish's history and physical for further details. He was evaluated by psychiatry, Dr. Jennet MaduroPucilowska. He was medically cleared later in the day on February 28, as his blood sugar remained stable. He would not let nursing administer IV fluids. He was placed on just oral regimen and his sugars remain stable, about 80 to 100. He was medically cleared and was transferred to inpatient behavioral medicine.   PERTINENT PHYSICAL EXAMINATION:  VITAL SIGNS: On the date of discharge, his temperature was 97.5, heart rate 80 per minute, respirations 22 per minute, blood pressure 138/78 mmHg. He was saturating 94% on room air.  CARDIOVASCULAR: S1, S2 normal. No murmurs, rubs or gallop.  LUNGS: Clear to auscultation bilaterally. No wheezing, rales, rhonchi or crepitation.  ABDOMEN: Soft, benign.  NEUROLOGIC: Nonfocal examination. All other physical examination remained at baseline.   DISCHARGE MEDICATIONS: Prilosec 20 mg  p.o. daily.   DISCHARGE DIET: Low sodium, low fat, low cholesterol, 1800 ADA.   DISCHARGE ACTIVITY: As tolerated.   DISCHARGE INSTRUCTIONS AND FOLLOWUP: The patient was instructed follow up with his primary care physician, [facility] 2 to 3 days after discharge. He will also need psychiatric followup for long-term care.   TOTAL TIME TAKING CARE OF THIS PATIENT: 45 minutes.    ____________________________ Ellamae SiaVipul S. Sherryll BurgerShah, MD vss:jm [name]: 01/19/2013 15:04:00 ET T: 01/19/2013 15:37:18 ET JOB#: [id number]  cc: Hafsa Lohn S. Sherryll BurgerShah, MD, <Dictator> Dione HousekeeperMario Ernesto Olmedo, MD Jolanta B. Jennet MaduroPucilowska, MD Patricia PesaVIPUL S Aleiya Rye MD ELECTRONICALLY SIGNED 01/20/2013 12:09

## 2015-03-13 NOTE — H&P (Signed)
PATIENT NAME:  [name], [name] MR#:  [id number] DATE OF BIRTH:  09/30/1976  DATE OF ADMISSION:  02/19/2013  PRIMARY CARE PHYSICIAN:  Dr. Zada Finders.    REFERRING PHYSICIAN:  Dr. Manson Passey.   CHIEF COMPLAINT:  Suicidal attempt.   HISTORY OF PRESENT ILLNESS:  The patient is a 39 year old Caucasian male with a past medical history of borderline diabetes mellitus, hypertension, GERD, alcohol and tobacco abuse, and multiple, multiple admissions in the past with suicidal attempt with insulin overdose in combination with drugs and alcohol, is again presenting to the ER with the same complaint of intentional drug overdose to kill himself.  The patient reports that he took 300 units of short-acting NovoLog and 1000 units of long-acting Lantus insulin and then overdosed himself with 15 to 20 pills of Seroquel and 25 to 30 pills of Lithium.  After he overdosed himself he called EMS and he was brought into the ER.  By the time EMS went to his home he was drinking alcohol heavily.  The patient was brought into the ER.  Initial Accu-Chek was 33.  He has received several ampules of D50.  One D50 amp was given by the EMS and in the ER he has received multiple ampules of D50 and eventually he was started on D10.  Initially the D10 was started at 125 mL an hour and Accu-Cheks were checked every 15 minutes.  Eventually the D10 was increased to 200 and then to 250 mL per hour.  The patient is still being hypoglycemic, one dose of Glucovance subQ was given.  On-call surgeon Dr. Michela Pitcher is called to start a central line in order to change his D10 to D20 as patient is persistently hypoglycemic.  Interestingly, he is still awake, alert and answering questions, though he is feeling weak and tired.  He is asking for chocolate milk and chocolate flavored ice cream to eat.  He admitted that he is severely, severely depressed and he took his Dad's insulin to kill himself.  He sees a psychiatrist and his last visit was on March 31st.  He denies any  chest pain or shortness of breath.  No abdominal pain either.  The last Accu-Chek at 12:15 a.m. was 61 and at 12:30 a.m. was at 67.  Poison Control was called regarding Seroquel and lithium overdose and they have recommended to recheck lithium levels at 2:00 a.m. and in the morning.  The patient is also started on CIWA protocol regarding alcohol abuse.  No family members are available at bedside.   PAST MEDICAL HISTORY:  Borderline diabetes mellitus, hypertension, alcohol abuse, tobacco abuse, GERD, multiple admissions with suicidal attempt, bipolar affective disorder, and acute depression.   PAST SURGICAL HISTORY:  None.   ALLERGIES:  ASPIRIN, CODEINE, MOTRIN, PENICILLIN AND TRAMADOL.  PSYCHOSOCIAL HISTORY:  Lives alone, smokes 1 pack a day.  Drinks alcohol.  Denies any street drugs.   FAMILY HISTORY:  Diabetes runs in his family.   HOME MEDICATIONS:  Seroquel 50 mg 3 tablets once a day,  Prilosec over-the-counter 20 mg once a day, lithium 300 mg 2 tablets orally, gabapentin 300 mg 2 capsules by mouth 3 times a day, fluoxetine 40 mg once a day, enalapril 20 mg once a day, amlodipine 10 mg once a day, clonazepam 1 mg tablet by mouth 4 times a day.   REVIEW OF SYSTEMS:  CONSTITUTIONAL:  Complaining of weakness and tiredness, fatigue.  Denies any weight loss or weight gain.  EYES:  No glaucoma or cataracts.  Denies any inflammation.  EARS, NOSE, THROAT:  Denies any tinnitus, ear pain, discharge, snoring, redness of the oropharynx and difficulty in swallowing.  RESPIRATORY:  Denies cough, COPD, hemoptysis, dyspnea.  CARDIOVASCULAR:  No chest pain, orthopnea, palpitations.  GASTROINTESTINAL:  No nausea, vomiting, diarrhea, but has positive history of GERD.  GENITOURINARY:  No dysuria or hematuria.  ENDOCRINE:  Denies any polyuria, nocturia, thyroid problems. HEMATOLOGIC AND LYMPHATIC:  No anemia, easy bruising or bleeding.  INTEGUMENT:  No acne, rash, lesions.  MUSCULOSKELETAL:  Denies any  history of gout or arthritis.  No limited activity.  Denies any pain in the neck or back.   NEUROLOGIC:  Denies any vertigo, ataxia, dementia.  PSYCHIATRIC:  Has a history of severe depression, bipolar affective disorder.  PHYSICAL EXAMINATION:  VITAL SIGNS:  Temperature 98.8, pulse 90, respirations 20, blood pressure is 174/92, pulse ox 99%.   GENERAL APPEARANCE:  Not in any acute distress, but lethargic.  But patient is arousable and answers questions appropriately, moderately built and obese.  HEENT:  Normocephalic, atraumatic.  Pupils are equally reacting to light and accommodation.  No scleral icterus.  No conjunctival injection.  Extraocular movements are intact.  No sinus tenderness.  No nasal discharge.  Oropharynx is moist.  No pharyngeal exudates.  NECK:  Supple.  No JVD.  No thyromegaly.  No lymphadenopathy.  LUNGS:  Clear to auscultation bilaterally.  No accessory muscle use.  No anterior chest wall tenderness.  CARDIOVASCULAR:  S1, S2 normal.  Regular rate and rhythm.  No murmurs, gallops.  No peripheral edema.  Peripheral pulses are 2+.  GASTROINTESTINAL:  Soft.  Bowel sounds are positive in all four quadrants.  Nontender, nondistended.  No masses felt.  No hepatosplenomegaly.   NEUROLOGIC:  Lethargic, but arousable and answering questions appropriately.  Alert and oriented x 3.  Cranial nerves II through XII are intact.  Motor and sensory is grossly intact.  Reflexes are 2+.  SKIN:  Normal skin turgor, warm to touch.  No lesions.  Multiple tattoos were noticed on the upper and lower extremities.  No rashes or no lesions noticed.  MUSCULOSKELETAL:  No joint effusion, tenderness.  No cyanosis, clubbing, or edema.  Peripheral pulses are 2+.  PSYCHIATRIC:  Flat affect and depressed mood.   LABORATORY DATA AND IMAGING STUDIES:  Accu-Cheks, the past 6 readings, 40, 55, 74, 97, 61, 67.  Glucose 199, BUN 8, creatinine 0.88, sodium 139, potassium 3.4, chloride 105, CO2 27, anion gap is 9, GFR  greater than 60, serum osmolality 281, calcium 9.0.  LFTs are within normal range.  Urine drug screen is negative.  WBC 10.4, hemoglobin is 13.5, hematocrit 38.9, platelet count is 386.  Urinalysis, blood negative, protein negative, nitrate and leukocyte esterase are negative, color is clear, glucose negative.  TSH is 9.42 which is elevated.  Serum lithium level is 0.72.  ASSESSMENT AND PLAN:  A 39 year old Caucasian male with multiple previous admissions in the past with a suicidal attempt with insulin and a drug overdose is presenting again with insulin, lithium, Seroquel overdose and alcohol abuse, was brought into the ER via EMS.  1.  Suicidal attempt with drug and insulin overdose.  The plan is to admit him to CCU.  Accu-Cheks q. 15 minutes.  Continue D10 at 250 mL per hour and we will change that to D20 at 125 mL per hour after central line placement by surgeon, Dr. Michela Pitcher.  I have called and notified.  He is aware and he is on  his way.  We will provide him glucagon 1 mg subQ as needed for hypoglycemia.  2.  Severe depression.  Psych consult is placed.  The patient is made IVC by the ER physician and he will be on one on one observation.   3.  Seroquel and lithium overdose.  Right now lithium levels are normal.  We will repeat a lithium level in 2 hours and also in a.m. as recommended by Poison Control.  We will continue monitoring neuro checks.  4.  Alcohol abuse.  The patient is placed on CIWA protocol.  5.  Hypokalemia.  We will replace potassium and check potassium levels in a.m.  6.  Hypertension and gastroesophageal reflux disease.  We will resume home medications.  7.  Gastrointestinal prophylaxis with Prilosec over-the-counter.  8.  Deep vein thrombosis prophylaxis with Lovenox subQ.  9.  Consult is placed to case management regarding psychosocial issues as the patient was admitted here to the hospital multiple times with insulin overdose and he lives alone.   10.  Elevated TSH.  I will check  Free T4 and T3.    TOTAL CRITICAL CARE TIME SPENT:  Is 60 minutes.   The plan of care was discussed in detail with the patient.  He verbalized understanding of the plan.    ____________________________ Ramonita LabAruna Ilana Prezioso, MD ag:ea D: 02/20/2013 00:53:19 ET T: 02/20/2013 01:54:01 ET JOB#: [id number]  cc: Ramonita LabAruna Elsworth Ledin, MD, <Dictator> Dr. Valda Favialmedo  Nimsi Males MD ELECTRONICALLY SIGNED 02/27/2013 1:50

## 2015-03-13 NOTE — H&P (Signed)
PATIENT NAME:  [name], [name] MR#:  [id number] DATE OF BIRTH:  02/28/1976  DATE OF ADMISSION:  10/09/2013  PRIMARY DOCTOR:  Dr. Rolin BarryMario Olmedo   EMERGENCY ROOM PHYSICIAN:  Dr. Glenetta HewMcLaurin   CHIEF COMPLAINT:  Insulin overdose.   HISTORY OF PRESENT ILLNESS:  This is a 39 year old man with multiple frequent admissions for insulin overdose, comes in today with overdose of Lantus.  The patient took 4 bottles of Lantus each one has 10 mL with each mL has 100 units and he states he took 4 full vials and he showed the empty bottles and he also states that he took NovoLog FlexPen 3 mL, each mL has 100 units.  The patient took this because he states he is depressed and he took  lantus for > suicide attempt.  The patient was given D50 by EMS.  When he came to the ER, according to nurse's documentation he was rude and asking for something for DTs and also he was asking for something for pain.  The patient was seen in the Emergency Room on November 15th for pain and also rib fractures.  He was here recently, discharged on November 3rd.  The patient was admitted on November 2nd and discharged on November 3rd.  At that time the patient had the same problem, taking overdose of Nyquil, seen by Dr. Jennet MaduroPucilowska.  The patient was started on Seroquel higher dose, likely 100 mg and the patient was reported for ACT team.  The patient states that he took his father's insulin today and according to him, his father said that he does not need that anymore and so he took that.  According to him he does not have any more insulin vials.  Denies any problem now and asking for pain medication.  No chest pain.  No trouble breathing.  Oriented to time, place and person.  The patient's blood sugars have been initially after coming to the ER was 133, but  later  blood  sugars have been 39, 32, 93.  The patient right now is on D10 at 100 mL/hr.    PAST MEDICAL HISTORY:  Significant for:  1.  GERD.  2.  Multiple hospitalizations for insulin  overdose with hypoglycemia and suicidal ideation.  3.  Hypertension.  4.  GERD. 5.  Alcohol dependence. 6. Tobacco abuse.  7.  Depression and anxiety.   PAST SURGICAL HISTORY:  None.   ALLERGIES:  None.   SOCIAL HISTORY:  Smokes about a pack per day and also drinks beer.  No IV drugs.   FAMILY HISTORY:  Diabetes and MI in the family.   REVIEW OF SYSTEMS:  CONSTITUTIONAL:  No fever.  No fatigue.  No weakness.  Has back pain.  EYES:  No blurred vision.  No double vision.  No glaucoma. EARS, NOSE, THROAT:  No ear pain or hearing loss or discharge or ringing in the ears.  RESPIRATION:  No cough.  No wheezing.  No rhonchi.  CARDIOVASCULAR:  No chest pain. No orthopnea. No PND.  GASTROINTESTINAL:  No abdominal pain.  No nausea.  No vomiting.  GENITOURINARY:  No dysuria or hematuria. ENDOCRINE:  No polyuria or nocturia.  SKIN:  No skin rashes.  MUSCULOSKELETAL:  No joint pains.  NEUROLOGIC:  No history of CVA or TIA.   PSYCHIATRIC:  Has a history of depression and multiple suicidal ideation.    ADMISSION MEDICATIONS:   1.  Acetaminophen with hydrocodone 5/325 one tablet every 8 hours as needed for pain.  2.  Nexium 40 mg p.o. daily.  3.  Seroquel 200 mg p.o. daily.  4.  Topamax 50 mg p.o. b.i.[name].  5.  Trazodone 50 mg at bedtime.   PHYSICAL EXAMINATION: VITAL SIGNS:  Temperature 99.2, heart rate 120, blood pressure 136/76, sats 98% on room air.  GENERAL:  He is alert, awake, oriented, having some back pain due to fall, answering questions appropriately.  HEENT:  Normocephalic, atraumatic.  Pupils equally reacting to light.  Extraocular movements were intact.   EARS, NOSE, THROAT:  No tympanic membrane congestion. No turbinate hypertrophy. no oropharyngeal erythema NECK:  Normal range of motion. Thyroid not enlargNo lymphadenopathy. ed, RESPIRATION:  Clear to auscultation.  No wheeze.  No rales.  The patient is not using accessory muscles of respiration.  CARDIOVASCULAR:  S1, S2  regular.  No murmurs, tachycardic.  PMI not displaced.  Good femoral pulses and no extremity edema. ABDOMEN:  Soft, nontender, nondistended.  Bowel sounds are heard.  No hepatosplenomegaly.  No hernias.  MUSCULOSKELETAL:  Strength 5/5 in upper and lower extremities.  There is no (cyanosis or clubbing. SKIN:  No skin rashes.  NEUROLOGIC:  Alert, awake, oriented.  No focal neurological deficits.  Sensation is intact.  Motor examination is intact.  No contractures.  PSYCHIATRIC:  Oriented to time, place and person.   LABORATORY DATA:  WBC 9, hemoglobin is 13.6, hematocrit 40.4, platelets 172. (.  Electrolytes: Sodium is 135, potassium 3.7, chloride 104, bicarb 21, BUN 7, creatinine 0.85, glucose 64.  The patient's blood glucose levels have been 39, 48 and 32.  The most recent one is at 10:00, 112.  The patient had rib x-rays done on November 15th and showed mildly displaced fractures in the anterolateral aspect of the 7th through 10th ribs without any pneumothorax or effusion.   ASSESSMENT AND PLAN:   1.  The patient is a 39 year old male with recurrent hypoglycemic episodes and also insulin overdose.  The patient has been admitted multiple times with the same problem and the patient is suicidal with this insulin overdose.  The patient should not have access to any extra doses of insulin and the patient  should have authorized person to make sure  he has no access to Lantus or FlexPen because he has been using extra extremely high amounts of Lantus and FlexPen which is dangerous to his health.  At this time the patient is going to get admitted to the ICU.  We will check fingersticks every 1 hour and start D10 water at 100 mL an hour and continue to hold his diabetic medications and see how he does.   2.  The patient had suicidal ideation so I made involuntary commitment with one-to-one observation.  Continue that and obtain psychiatric consultation.   3.  Major depression.  The patient is on Seroquel and  also Topamax and trazodone.  Restart them and follow up with psychiatric recommendation.   The patient to the ICU with one-to-one observation and monitor him with CCU stepdown.  TIME SPENT ON HISTORY AND PHYSICAL: 60 minutes.     ____________________________ Katha Hamming, MD sk:ea [name]: 10/09/2013 22:46:48 ET T: 10/09/2013 23:56:13 ET JOB#: [id number]  cc: Katha Hamming, MD, <Dictator> Katha Hamming MD ELECTRONICALLY SIGNED 10/30/2013 22:27

## 2015-03-13 NOTE — Discharge Summary (Signed)
PATIENT NAME:  [name], Raliegh D MR#:  [id number] DATE OF BIRTH:  1976-01-21  DATE OF ADMISSION:  08/07/2013 DATE OF DISCHARGE:  08/12/2013  DISCHARGE DIAGNOSIS:  1.  Repeated suicidal attempt by insulin overdose.  2.  Hypertension.  3.  Chronic obstructive pulmonary disease.  4.  Depression.  5.  Alcohol dependence and withdrawal.   CONDITION ON DISCHARGE: Stable.   CODE STATUS: FULL CODE.   DISCHARGE MEDICATIONS: 1.  Amlodipine 10 mg once a day. 2.  Enalapril 20 mg once a day.  3.  Prilosec over-the-counter 20 mg once a day.  4.  Clonazepam 0.5 mg oral tablet every 12 hours as needed for anxiety.  5.  Neurontin 400 mg oral capsule 3 times a day.  6.  Prozac 40 mg oral capsule once a day.  7.  Quetiapine 400 mg oral once a day.  8.  Quetiapine 50 mg oral tablet three times a day. 9.  Chlordiazepoxide 10 mg oral capsule every 12 hours.  10.  Nicotine patch once a day.   DIET ON DISCHARGE: Low sodium. Consistency: Regular.   ACTIVITY: As tolerated.   TIMEFRAME TO FOLLOW-UP: Within 1 to 2 weeks. The patient is being transferred to [facility] facility for inpatient psychiatric care for repeated suicidal attempts and has to follow with psychiatry doctor as outpatient after discharge from there as advised by them.   HISTORY OF PRESENT ILLNESS: On 08/07/2013, by Dr. Milagros LollSrikar Sudini, a 39 year old Caucasian male with history of multiple suicidal attempts with insulin overdose and severe depression presented to the Emergency Room brought in by EMS. He took 5 pens of NovoLog. Not clear on how much NovoLog he took. Mentioned that he is not a diabetic, insulin is his dad's insulin which he left when he moved out. Mentioned he took insulin to kill himself after he broke up with his girlfriend. The patient was discharged home a week prior. Denied any suicidal ideation on that discharge, by Dr. Guss Bundehalla, psychiatrist, last week.   HOSPITAL COURSE AND STAY: The patient was started on D50 for hypoglycemia  with insulin overdose and he was continued on D10 IV drip and monitored with frequent blood sugar checks. Gradually over the next few days his blood sugar was remaining stable, and he came off IV fluids and started tolerating diet in the next 4 to 5 days. Psychiatrists followed him and they suggested to transfer him to [facility], inpatient psychiatric care facility, for further management of his repeated suicidal attempts and we are arranging for that. He was monitored in involuntary commitment state in hospital. Other medical issues in this hospital course:  1.  Hypertension. We continued home medication and blood pressure remained stable.  2.  Alcohol abuse. The patient was an alcoholic. He was monitored on CIWA protocol. Did not have much signs of alcohol withdrawal and remained stable.  3.  Tobacco abuse. We gave a nicotine patch while he was in the hospital.  LABORATORY DATA: In hospital, hemoglobin 12.9, platelet count was 153 and WBC was 4.3. On admission blood sugar was 131. Blood sugar level remained stable, around 100, and monitored frequently. Urinalysis was negative on presentation. Benzodiazepine in the urine was positive.   TOTAL TIME SPENT ON DISCHARGE:  40 minutes.  ____________________________ Hope PigeonVaibhavkumar G. Elisabeth PigeonVachhani, MD vgv:sb D: 08/12/2013 11:05:45 ET T: 08/12/2013 11:56:56 ET JOB#: [id number]  cc: Hope PigeonVaibhavkumar G. Elisabeth PigeonVachhani, MD, <Dictator> Altamese DillingVAIBHAVKUMAR Fanny Agan MD ELECTRONICALLY SIGNED 08/16/2013 18:34

## 2015-03-13 NOTE — H&P (Signed)
PATIENT NAME:  [name], [name] MR#:  [id number] DATE OF BIRTH:  [birth year]  DATE OF ADMISSION:  12/17/2012  REFERRING PHYSICIAN:  Vipul S. Sherryll BurgerShah, MD ATTENDING PHYSICIAN:  Kristine LineaJolanta Herberto Ledwell, M.[name].   IDENTIFYING DATA:  The patient is a 39 year old male with a history of substance abuse and multiple suicide attempts by insulin overdose.   CHIEF COMPLAINT: "I am anxious."   HISTORY OF PRESENT ILLNESS: The patient was hospitalized at [facility] from January 17 until the January 21, following admission to CCU for insulin overdose. Five days later, he returns to the Emergency Room again after an overdose on insulin. He reportedly used 7 pre-loaded NovoLog insulin pens for a total dose of 2100 units. Reportedly, he did it in front of his girlfriend, who called the ambulance. This is the 9th suicide attempt by insulin overdose by this unfortunate patient this year and the second this month. He is unable to explain his actions. He broke up with his girlfriend of 10 years in the summer. Reportedly, they got together again and then she dumped him again. The patient has never been compliant with treatment, never follows up with his appointments, takes no medications. He was committed to outpatient substance abuse treatment following his last discharge on January 21; however, he did not have a chance to see the psychiatrist yet. In addition to multiple suicide attempts and presumably depression, the patient has a long history of substance abuse including alcohol and prescription pills. He usually abuses benzodiazepines.  He, at the moment, denies any thoughts of hurting himself, denies psychotic symptoms, denies substance use, but was drunk at the time of admission to CCU.   PAST PSYCHIATRIC HISTORY: There are multiple hospitalizations for suicide attempts. His first extended hospitalization as a child [facility] following his parents' divorce. He was hospitalized  here after what he called accidental insulin overdose and now increasingly admits that it was a suicide attempt. He has been tried on numerous medications including Prozac, Tegretol, lithium, Valium and Zoloft, but has never been compliant.   FAMILY PSYCHIATRIC HISTORY: None reported.    PAST MEDICAL HISTORY: Hypertension, diabetes, GERD, asthma, alcoholic hepatitis, pseudoseizures.   ALLERGIES: ASPIRIN, CODEINE, MOTRIN, PENICILLIN, TRAMADOL.   MEDICATIONS ON ADMISSION: Amlodipine 10 mg daily, enalapril 20 mg daily, Nexium 40 mg daily, Prozac 40 mg daily, Seroquel 150 mg at bedtime, Seroquel 50 mg twice daily.   SOCIAL HISTORY: He apparently lives alone now. His father, following [facility] at [facility], was sent to a skilled nursing facility. He now lives with his brother. The patient lives home alone in a trailer. He is very isolated. His ex-girlfriend spends some time with him. It is unclear where he gets his insulin. He says that it is his father's, but this month he overdosed on 1300, then 700, then 2100 units of insulin. I cannot imagine that such a vast amount of insulin is readily available. The patient has not been prescribed any insulin for a long time. He uses  insulin belonging to his father. We need to call the pharmacy to investigate what is the source of insulin. Of note, he attempted to inject insulin while in the hospital during previous admission. The patient is disabled. He has Medicaid.   REVIEW OF SYSTEMS: CONSTITUTIONAL: No fevers or chills. No weight changes.  EYES: No double or blurred vision.  EARS, NOSE, THROAT: No hearing loss.  RESPIRATORY: No shortness of breath or cough.  CARDIOVASCULAR: No  chest pain or orthopnea.  GASTROINTESTINAL: No abdominal pain, nausea, vomiting or diarrhea.  GENITORURINARY: No incontinence or frequency.  ENDOCRINE: Positive for overdose on insulin.  LYMPHATIC: No anemia or easy bruising.  INTEGUMENTARY: No acne  or rash.  MUSCULOSKELETAL: No muscle or joint pain.  NEUROLOGIC: No tingling or weakness.  PSYCHIATRIC: See history of present illness for details.   PHYSICAL EXAMINATION: VITAL SIGNS: Blood pressure 136/84, pulse 72, respirations 18, temperature 97.7.  GENERAL: This is a slightly obese male in no acute distress.  HEENT: The pupils are equal, round and reactive to light. Sclerae anicteric.  NECK: Supple. No thyromegaly.  LUNGS: Clear to auscultation. No dullness to percussion.   HEART: Regular rhythm and rate. No murmurs, rubs or gallops.  ABDOMEN: Soft, nontender, nondistended. Positive bowel sounds.  MUSCULOSKELETAL: Normal muscle strength in all extremities.  SKIN: No rashes or bruises.  LYMPHATIC: No cervical adenopathy.  NEUROLOGIC: Cranial nerves II through XII are intact.   LABORATORY DATA: Chemistries are within normal limits. Blood sugar on admission 46. Blood alcohol level is 0.153. LFTs within normal limits. Urine tox screen negative for substances. CBC within normal limits. Urinalysis is not suggestive of urinary tract infection. Serum acetaminophen less than 2. Serum salicylates less than 1.7.   MENTAL STATUS EXAMINATION ON ADMISSION: The patient is alert and oriented to person, place, time and situation. He changes his story several times. He is not agitated, but continues to hit the wall with his fists as if trying to draw attention. He complains of anxiety. He recognizes me from previous admission. He is pleasant and polite and he is wearing hospital scrubs. He looks unkempt. There is some eye contact. His speech is soft. Mood is depressed with labile affect. Thought processing is logical with its own logic. Thought content: He denies suicidal or homicidal ideation, but was admitted after a suicide attempt by insulin overdose. There are no delusions or paranoia. There are no auditory or visual hallucinations. His cognition is difficult to assess, too upset to participate. His  insight and judgment are extremely poor.  SUICIDE RISK ASSESSMENT: This is a patient with a long history of mental illness, who has been overdosing on insulin regularly, coming to the hospital for that, never able to explain what his problem is and never wanting to continue in psychiatric treatment, in spite of outpatient substance abuse involuntary commitment.   DIAGNOSES: AXIS I: Alcohol dependence. Mood disorder, not otherwise specified.   AXIS II: Deferred.   AXIS III: Obesity, hypertension, diabetes.   AXIS IV: Mental illness, primary support, relationship, extremely poor coping skills.   AXIS V: Global Assessment of Functioning: 20.    PLAN: The patient was admitted to [facility] for safety, stabilization and medication management. He was initially placed on suicide precautions and was closely monitored for any unsafe behaviors. He underwent full psychiatric and risk assessment. He received pharmacotherapy, individual and group psychotherapy, substance abuse counseling and support from therapeutic milieu.   1.  Suicidal ideation; the patient denies.  2.  Mood. We will continue Prozac and Tegretol.  3.  Alcohol dependence. The patient has a history of drinking, but I do not believe that he needs detox. We will withhold the benzodiazepines, as he has a history of abuse.  4.  Medical. We will continue all medication as prescribed by the primary doctor at the time of transfer from the medical floor and no antidiabetes drugs will be given.  5.  Substance abuse  treatment. The patient, numerous times, overdosed on insulin while drunk. He did go to [facility] in the summer of 2013. We will make another referral.  6.  Disposition.  To be established.      ____________________________ Ellin Goodie. Jennet Maduro, MD jbp:cc [name]: 12/17/2012 18:16:00 ET T: 12/17/2012 21:42:18 ET JOB#: [id number]  cc: Danah Reinecke B. Rayne Loiseau, MD, <Dictator> Vipul S. Sherryll Burger,  MD  Shari Prows MD ELECTRONICALLY SIGNED 01/17/2013 6:39

## 2015-03-13 NOTE — Consult Note (Signed)
Psychiatry: Patient seen for followup. Patient this morning denies any suicidal ideation and denies any wish to die. He is able to articulate positive things in his life to live for. His affect is blunted and he appears sluggish, perhaps from the recent ativan dose. is well known to our service and his beehavior of repeated insulin overdose is typical. He has been given inpatient substance abuse treatment and has been trans[facility] and yet has persisted in his behavior. He has outpatient psychiatric care in place and is aware of the potantial fatal risks of his continued behavior. He has been tried on multiple medications and it does not appear that even assuring the best observasble mood and affect in the hospital can mitigate his risk to repeat dangerous behavior. I believe his pattern is primarily typical of personality disorder and that he is not really motivated for a basic change in his behavior. I do not think therefore that transfer to inpatient psychiatry is likely to actually decrease his risk. I have counceled him about his dangerous behavior and he is strongly urged to go to outpatient treatment. I will restart the dc order from yesterday and yet leave the 1:1 order until he leaves because of his impulsivity.Case has been discussed with medicine team and [facility].  Electronic Signatures: Clapacs, Jackquline DenmarkJohn T (MD)  (Signed on 23-Oct-14 10:58)  Authored  Last Updated: 23-Oct-14 10:58 by Audery Amellapacs, John T (MD)

## 2015-03-13 NOTE — Consult Note (Signed)
PATIENT NAME:  [name], [name] MR#:  [id number] DATE OF BIRTH:  Apr 19, 1976  DATE OF CONSULTATION:  02/20/2013  CONSULTING PHYSICIAN:  Shane Amel, MD  IDENTIFYING INFORMATION AND REASON FOR CONSULT:  This is a 39 year old man who came into the hospital last night having intentionally overdosed himself on insulin, as well as lithium. Consultation for appropriate psychiatric management.   HISTORY OF PRESENT ILLNESS:  Information obtained from the chart and the patient. The patient says that he went home and became very depressed. He decided he wanted to kill himself. He admits that he took a large overdose of insulin. He is not sure how much and is probably not an accurate judge. Also says that he overdosed on lithium tablets that he had been given. Although he says that he was having suicidal ideation, he then called 911. He was seen to be drinking when 911 showed up. The patient was brought into the hospital.  On interview today, he is fatigued and dysphoric. He claims that he is feeling severely depressed. He is not able to give much else in the way of useful history. This patient just left the hospital on Monday, March 31 after a hospital stay of almost a month. He had been on the psychiatry service because he had attempted to kill himself by insulin overdose. He has done this a great many times, one after another this year and at this point seems to be unable to go even a day outside the hospital without trying to kill himself. He had been in the behavioral health unit for a month during which time his affect and mood were mostly fairly stable. He would get irritable and angry at times and behave petulantly but towards the end of his hospital stay, he had reported that his mood was feeling really good and he had denied sadness, depression and totally denied suicidal ideation. He was not reporting psychotic symptoms and said that he was comfortable with his medication. We had initially been thinking of  sending him to the [facility] but because of the long wait, we reconsidered when his girlfriend who had previously left him showed back up again and said that she would move in with him. The treatment team felt that this was enough of a positive motivator that it might keep him from trying to kill himself again.   PAST PSYCHIATRIC HISTORY:  This gentleman has had lifelong psychiatric problems with mood instability, behavioral instability, anger. He has been diagnosed with bipolar disorder NOS, but he does not typically show either full-blown major depression or typical mania, but rather has a chronic mood lability and on top of that, a substance abuse problem. He has been in this habit of trying to kill himself repeatedly when he drinks for quite a while now. He has been tried on multiple medications, including antidepressants, antipsychotics, mood stabilizers, none of which seemed to have been able to prevent him from this behavior even though he seemed to be feeling better when he was in the hospital last time.  PAST MEDICAL HISTORY:  The patient had previously had some degree of diabetes, but no longer seems to have problems with his blood sugar and therefore does not have any reason to be using insulin himself. He does have a history of obesity, hypertension and GI reflux.   SOCIAL HISTORY:  He is currently living with a girlfriend but just for the last day or so. Not able to work. Minimal support or activity outside  of immediate family.   REVIEW OF SYSTEMS:  Today he just complains of feeling sick to his stomach, feeling tired and being depressed.   MENTAL STATUS EXAM:  The patient was seen in the CCU. He was easily arousable, made poor eye contact. Psychomotor activity very limited. Speech limited and decreased in amount. Speech slurred. Affect flat. Mood stated as being very depressed. Thoughts were limited, simple, heart otherwise to judge. Did not make any obviously bizarre or  delusional statements. Denies hallucinations. Endorses recent suicidal ideation. No homicidal ideation. Judgment and insight poor. Baseline intelligence poor.   ASSESSMENT:  This patient was trusted to try to make a go of taking care of himself after an extended hospitalization. At the time of leaving, he was showing no sign whatsoever of depression. He now claims that he became severely depressed after leaving the hospital. He also was drinking. This sort of thing is not really typical of real bipolar disorder, so much as severe personality disorder and substance abuse, which is out of control. The patient once again has made a very serious suicide attempt, which could have resulted in death or permanent injury. Unable to take care of himself. Really needs to be in some kind of longer-term treatment.   TREATMENT PLAN:  I feel very much that we should refer [facility]. We are working on trying to get that referral done and I will try and work with the medical service to take care of him in the way that best facilitates that.  No indication to restart any psychiatric medications right now. He is on a detox protocol and will get monitoring of his lithium level.   DIAGNOSIS, PRINCIPAL AND PRIMARY:   AXIS I:  Borderline personality disorder.   SECONDARY DIAGNOSES: AXIS I:  Alcohol dependence.  AXIS II:  Antisocial personality disorder, dependent personality disorder.   AXIS III:  Obesity, hypertension, gastrointestinal reflux, status post overdose on lithium and insulin.   AXIS IV:  Severe from his lack of support.   AXIS V:  Functioning at time of evaluation 20.    ____________________________ Shane AmelJohn T. Makensey Rego, MD jtc:ce D: 02/20/2013 16:02:32 ET T: 02/20/2013 16:50:42 ET JOB#: [id number]  cc: Shane AmelJohn T. Aldine Chakraborty, MD, <Dictator> Shane AmelJOHN T Anniyah Mood MD ELECTRONICALLY SIGNED 02/20/2013 17:54

## 2015-03-13 NOTE — Consult Note (Signed)
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   Patient was seen by consultant.   Comments: Probale acute contusion on a chronic problem  Soft dressing as needed. F/U with hand specialist in 7-10 days.  Electronic Signatures: Celesta Gentilealiff, Real Cona C (MD)  (Signed 19-Apr-14 12:17)  Authored: Brief Consult Note   Last Updated: 19-Apr-14 12:17 by Celesta Gentilealiff, [name] C (MD)
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
## 2015-03-14 NOTE — Consult Note (Signed)
PATIENT NAME:  [name], [name] MR#:  [id number] DATE OF BIRTH:  1976/10/11  DATE OF CONSULTATION:  11/29/2013  CONSULTING PHYSICIAN:  Audery Amel, MD  IDENTIFYING INFORMATION AND REASON FOR CONSULTATION:  A 39 year old man who presented to the Emergency Room having intentionally overdosed himself with insulin. Consultation for possible suicidality and appropriate psychiatric treatment.   HISTORY OF PRESENT ILLNESS: Information obtained from the patient and the chart. The patient came into the Emergency Room yesterday after calling EMS himself. He had taken an unspecified large amount of insulin intentionally and then called 911. He tells me today that he did it because he was feeling depressed at the time. The depression was rapid onset after he had spent some time around his ex-girlfriend. His ex-girlfriend is now seeing another man but she still comes around and helps the patient out. Apparently, this is humiliating to him because he says just getting assistance from her is enough to make him depressed. He had been drinking some beer already but then he drank some more. At some point, he decided to shoot himself up with insulin and call 911. The patient tells me that at no point did he have any thoughts that he was going to die from this or intention of killing himself. He repeats to me that he did it for "attention" because he feels unhappy being at home and upset with his life situation. He does not reporting any psychotic symptoms. He does not report that he has been abusing any other drugs. He says he did not take any of his prescription medicines after he went home.   PAST PSYCHIATRIC HISTORY: This is one of well over 20 episodes of identical behavior on this patient's part that have happened in the last several months. He had actually just been discharged from the hospital the previous day after an episode of the same behavior. The patient has received extensive psychiatric evaluation and  treatment for this behavior. Multiple diagnoses have been considered at times and he has tried multiple medications, including antipsychotics, antidepressants and mood stabilizers. I have worked with the patient myself on several occasions over the last several months. 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 This is based among other things on the fact that typically once the patient recovers from his overdose episodes, he will return to a baseline euthymic mood. In fact, we have had episodes where we kept him on the psychiatry ward for weeks at a time during which he remained euthymic and upbeat throughout and showed no symptoms of major depression. There is no evidence that medication has been of any help in changing his mood or behavior problems. At times, especially when he is still intoxicated, the patient has stated that he had suicidal intent with these overdoses but particularly recently, he has insisted that he does not have any real intention of dying from any of them. His behavior has been consistent with that as he is always the first to call 911 and is usually completely cooperative with medical treatment.   SUBSTANCE ABUSE HISTORY: The patient abuses alcohol regularly. Does not have a dependence problem identified with other drugs. No history of withdrawal, seizures or delirium tremens.   SOCIAL HISTORY: He is unmarried and not in a relationship currently. He lives on his own. He lives in impoverished circumstances in a trailer on property owned  by his family. His closest relative is his father who lives close by. The father has diabetes and it is the father's insulin that the patient is constantly stealing and using. Attempts have been made by ACT team to get the father to be more secure with the insulin, but I am told that they have not  succeeded with this. The patient says that he is chronically bored, has no friends, no transportation, does not feel like he has anything to do at home. He does have ACT team services currently.   PAST MEDICAL HISTORY: The patient himself does not have diabetes. He has overdosed on insulin multiple times often presenting with blood sugars down in 20s. Otherwise, he has a chronic pain syndrome with some history of substance abuse. No other ongoing medical problems.   FAMILY HISTORY: Unknown.   CURRENT MEDICATIONS: At the time of last discharge, he was still being treated with Wellbutrin, Excel 300 mg daily, clonazepam 2 mg 3 times a day, carbamazepine 200 mg at night, quetiapine 400 mg at night, amlodipine 10 mg a day, Enalapril 10 mg a day, naltrexone just started at 50 mg a day.   ALLERGIES: No known drug allergies.   MENTAL STATUS EXAMINATION:  I saw the patient in the Critical Care Unit today. He was awake, alert and cooperative. Made good eye contact. Psychomotor activity a little bit sluggish. Speech is not slurred and is easy to understand, decreased in total amount. Affect blunted. He does get irritated when he learns that he will not be immediately discharge. Mood is stated as being better. Thoughts are simple, slow, but not grossly disorganized or bizarre. No evidence of psychotic thinking. Denies auditory or visual hallucinations. He denies any current suicidal or homicidal ideation at all. Judgment and insight chronically poor. Intelligence low average at best, I would say. Alert and oriented x 4 currently.   LABORATORY RESULTS: He presented this time to the Emergency Room with a glucose which at its lowest was measured at 16. Otherwise, no remarkable changes to the rest of his lab studies. His alcohol level on presentation was 125.   ASSESSMENT: This is a 39 year old man with a history of repetitive behavior in which he intentionally causes himself harm using insulin and then immediately  seeks medical help.  He does not meet criteria for major depressive syndrome. He is not psychotic. He does not have a history consistent with bipolar disorder. He does abuse alcohol regularly. The patient has caused much concern among the whole treatment community with his behavior. A recent meeting of several interested parties resulted in a plan being created in which we will once again refer the patient to the [facility] with the hopes that they may be able to perform some kind of treatment that we have not that will help him. At this point, the plan is that he will be directly transferred under involuntary commitment to [facility] and there only criteria is that he be 24 hours off of IV fluid with a stable blood sugar. The social worker staff is all aware of this and I am told that social work will be involved over the weekend to try and make this happen. No other change to medication indicated at this time.   DIAGNOSIS, PRINCIPAL AND PRIMARY:  AXIS II: Borderline personality disorder.  SECONDARY DIAGNOSES: AXIS I: Alcohol dependence.  AXIS III: Status post overdose of insulin, high blood pressure, chronic pain syndrome.  AXIS IV: Severe from his isolation and  loneliness and behavior problems.  AXIS V: Functioning at time of evaluation 34.    ____________________________ Audery AmelJohn T. Clapacs, MD jtc:ce D: 11/29/2013 18:04:35 ET T: 11/29/2013 19:04:20 ET JOB#: [id number]  cc: Audery AmelJohn T. Clapacs, MD, <Dictator> Audery AmelJOHN T CLAPACS MD ELECTRONICALLY SIGNED 11/29/2013 21:46

## 2015-03-14 NOTE — Discharge Summary (Signed)
PATIENT NAME:  [name], [name] MR#:  [name] DATE OF BIRTH:  Jun 14, 1976  DATE OF ADMISSION:  01/17/2014 DATE OF DISCHARGE:  01/21/2014  DISCHARGE DIAGNOSES: 1.  Lantus overdose secondary to attention-seeking behavior and depression.  2.  Alcohol abuse.  3.  Tobacco abuse.  4.  Hypertension.  5.  Noncompliance.  6.  Hypoglycemia.   Imaging studies done showed no significant changes.   CONSULTATIONS:  1.  Dr. Jennet MaduroPucilowska of psychiatry.  2.  Dr. Tedd SiasSolum of endocrine.    ADMITTING HISTORY AND PHYSICAL AND HOSPITAL COURSE: Please see detailed H and P dictated previously. In brief, a 39 year old Caucasian male patient with history of depression, prior history of Lantus overdose on multiple occasions, presented in the same fashion again after overdosing on about 1200 units of Lantus and called EMS. The patient does not seem to have any suicidal ideation. He does have depression and attention-seeking behavior, for which he craves for attention, takes the Lantus and calls EMS and arrives to the hospital. The patient was initially on D10, and in spite of being on D10, needed multiple doses of glucagon, along with D50. By the day of discharge, the patient was seen by endocrine, who suggested following the patient. He had Accu-Cheks q.1 hour initially, later changed to q.2 hours and q.4 hours. He is doing well by the day of discharge. Has been seen by Dr. Jennet MaduroPucilowska of psychiatry. The patient's  IVC filter has been removed. He will follow up with the [facility], ACT team headed by Dr. Omelia BlackwaterHeaden.   Today on examination, his lungs sound clear. Heart rate is S1, S2, without any murmurs.   Discussed with Dr. Jennet MaduroPucilowska on the day of discharge.  The patient denies any suicidal ideation or plan.   DISCHARGE MEDICATIONS: 1.  Carbamazepine 200 mg oral once a day.  2.  Seroquel 400 mg oral once a day at bedtime.  3.  Enalapril 20 mg daily.   The patient's clonazepam, bupropion and amlodipine have been stopped.    DISCHARGE DIAGNOSES:  Low-salt diet. Activity as tolerated. Follow up with primary care physician in 1 or 2 weeks and ACT team.   The patient has been counseled to quit smoking, alcohol and overdosing on medications for attention seeking.   Time spent on discharge in discharge activity was 45 minutes.   ____________________________ Shane BailiffSrikar R. Ishanvi Mcquitty, MD srs:dmm [name]: 01/21/2014 15:27:22 ET T: 01/21/2014 23:00:35 ET JOB#: [id number]  cc: Shane HeathSrikar R. Brianca Fortenberry, MD, <Dictator> Shane FishermanSRIKAR R Velinda Wrobel MD ELECTRONICALLY SIGNED 01/23/2014 14:39

## 2015-03-14 NOTE — H&P (Signed)
PATIENT NAME:  [name], [name] MR#:  [id number] DATE OF BIRTH:  1976-05-15  DATE OF ADMISSION:  01/17/2014  PRIMARY CARE PHYSICIAN: None.   EMERGENCY ROOM  PHYSICIAN: Dr. Minna Antis.  REASON FOR ADMISSION:  Insulin overdose.  HISTORY OF PRESENT ILLNESS: This patient is a 39 year old male well known to our practice for repeated overdose of Lantus insulin. Comes in with the same. The patient took 4 pens of Lantus.  Each pen has 100 units and 3 mL in each pen, so he took about 400 units of Lantus today, and he also took Klonopin1> mg tablet,  took 3 tablets,and also he took Depakote 500 mg tablet, 2 of them, and he drank 7 beers at one time, so he is here because of overdose of multiple   meds with suicidal ideation. The patient says that he feels very depressed and feels very bored, and then he took his father's insulin. The patient's insulin pens were checked. They are all empty, and the patient says that he took it because he felt depressed.   PAST MEDICAL HISTORY:  Significant for repeated overdose on insulin, chronic pain syndrome, depression, substance abuse, personality disorder with history of repeated insulin overdose with suicidal ideation.  He was admitted to [facility] on the January 11th and he was discharged from [facility] on February 13th.  PAST SURGICAL HISTORY: None.   ALLERGIES: None.   SOCIAL HISTORY: Smokes 1 pack per day and,has h/o ETOH ABUSE, history of multiple hospitalizations for the same.   FAMILY HISTORY: Significant for diabetes and coronary artery disease.     REVIEW OF SYSTEMS:   CONSTITUTIONAL: He has no fever. Does have pain on the left side of the chest because of a  fall. He had a fall 2 days ago on the porch due to ice, and right now he feels pain when he takes deep breaths.  EYES: No blurred vision.  ENT: No tinnitus. No epistaxis. No difficulty swallowing.  RESPIRATORY: No cough. No wheezing.  CARDIOVASCULAR: No chest pain. No orthopnea.   GASTROINTESTINAL: No nausea. No vomiting. No abdominal pain.  GENITOURINARY: No dysuria.  ENDOCRINE: No polyuria. No polydipsia.  INTEGUMENTARY: No skin rash.  MUSCULOSKELETAL: Complains of pain around the left rib cage.  NEUROLOGIC: No numbness or weakness.  PSYCHIATRIC: The patient has depression and multiple admissions for insulin overdose with suicidal ideation.   HOME MEDICATIONS:   1.  Clonazepam 2 mg p.o. t.i.d. 2.  Enalapril 20 mg p.o. daily.  3.  Seroquel 400 mg p.o. at bedtime.  4.  Bupropion 300 mg 1 tablet daily.  5.  Amlodipine 10 mg daily.  6.  Tegretol 200 mg p.o. daily.   The patient's medications include Depakote at this time, but he says he gets the Klonopin and Depakote from [facility].   PHYSICAL EXAMINATION: VITAL SIGNS: Temperature 97.8, heart rate is 94, blood pressure is 147/78, sats 98% on room air.  GENERAL: Well-developed, well-nourished male, not in distress.  HEENT: Head: Normocephalic, atraumatic. Eyes: Pupils equally reacting to light. No conjunctival pallor. No scleral icterus. Extraocular movements are intact. Nose: No nasal lesions. No drainage.  Ears: No drainage. No external lesions.  Mouth: No lesions. Clinically-appearing dry mucosa.  NECK: Supple. No JVD. No masses. Normal range of motion.  RESPIRATORY: Good respiratory effort. Clear to auscultation. No wheeze.  CARDIOVASCULAR: S1, S2 regular. No murmurs.  LUNGS: Clear to auscultation, as I mentioned.  ABDOMEN: Soft, nontender, nondistended. Bowel sounds present. No organomegaly.  MUSCULOSKELETAL: Able to  move all extremities. Normal range of motion. Strength and tone are equal bilaterally.  VASCULAR: Good pulses.  NEUROLOGIC: Cranial nerves II through XII intact. Power 5/5 in upper and lower extremities. Sensation is intact.  DTRs are 2+ bilaterally. PSYCHIATRIC: The patient is depressed. Makes good eye contact. Speech is clear. Denies any hallucinations. Denies any homicidal ideations or suicidal  ideation at this time, and he says that [facility] after he gets better to go to the psych ward, but he does not want to go to [facility] again.   LABORATORY DATA:  Ethanol level 0.142.  Tylenol level less than 2.  Salicylate less than 1.7.     Electrolytes:  Sodium 135, potassium 3.8, chloride 101, bicarb 25, BUN 4, creatinine 0.7 and glucose 82. LFTs within normal limits. WBC 7.7, hemoglobin 14.3, hematocrit 43.2, platelets 250.   Urinalysis is clear. No leukocyte esterase.  Urine toxicology is negative.   Blood glucose 56.   ASSESSMENT AND PLAN:  The patient is a 39 year old male patient with:  1. Intentional overdose of insulin and also other medications with Klonopin and Depakote with suicidal ideation. Admit him to hospitalist service in the ICU due to hypoglycemia due to Lantus insulin. He is admitted to ICU.  We will start D10 at 100 an hour. Check Accu-Cheks every 1 hour. Monitor in ICU. Start glucagon drip as well.  2.  Recurrent suicidal ideation. Psychiatric consult is place. He is IVC. We will have a sitter with the patient.  3.  Personality disorder and depression.  He already took Depakote and Klonopin. We will hold other psychiatric medication at this time.  4.  History of alcohol abuse. The patient had 7 beers yesterday. We will place CIWA protocol for him.  5.  History of chronic pain syndrome. We will use Tylenol with no narcotics.  6. The patient has a history of hypertension. Blood pressure is normal.  We will continue his blood pressure medications.  TIME SPENT ON THIS ADMISSION: 60 minutes    ____________________________ Katha HammingSnehalatha Anida Deol, MD sk:dmm D: 01/17/2014 18:00:35 ET T: 01/17/2014 19:06:00 ET JOB#: [id number]  cc: Katha HammingSnehalatha Francely Craw, MD, <Dictator> Katha HammingSNEHALATHA Annette Bertelson MD ELECTRONICALLY SIGNED 02/03/2014 12:44

## 2015-03-14 NOTE — Consult Note (Signed)
Brief Consult Note: Diagnosis: Borderline personality disorder.   Patient was seen by consultant.   Consult note dictated.   Recommend further assessment or treatment.   Orders entered.   Discussed with Attending MD.   Comments: Psychiatry: Mr. Viscomi is well known to [place]. He was placed in a group home recently. He has the guardian now. He was brought to the ER with passive suicidal thoughts worried about his father's health. The father is being discharged from our hospital today in good standing. He is no longer suicidal.   He met with his new guardian today. He feels reassured. No longer a danger to self or others.    MSE: Alert, oriented, pleasant, poor grooming, good eye contact, normal speech, Depressed mood, denies suicidal/homicidal ideation, delusions, paranoia, hallucinations.  PLAN: 1. The patient no longer meets criteria for IVC. I will terminate proceedings. Please discharge as appropriate.    2. We will restart his medications as in the community. We will check VPA . No Rx necessary.    3. He will follow up with [facility] ACT team and Dr. Rosine Door.   4. Group home staff will pick him up..  Electronic Signatures: Orson Slick (MD)  (Signed 16-Mar-15 14:22)  Authored: Brief Consult Note   Last Updated: 16-Mar-15 14:22 by Orson Slick (MD)

## 2015-03-14 NOTE — Consult Note (Signed)
Brief Consult Note: Diagnosis: Borderline personality disorder.   Patient was seen by consultant.   Consult note dictated.   Recommend further assessment or treatment.   Orders entered.   Discussed with Attending MD.   Comments: Psychiatry: [name] is well known to us. He was placed in [facility] group home by court order and has the guardian now. He was brought to the ER after OD on unknown medications and insulin. Reportedly the patient left his group home and returned to his trailer where he had "old" medications put away. This is in the context of recent major loss and relapse on alcohol.   Today he feels "good" but still rather sleepy with no eye contact. Super strong body odor. Needs to shower.  PLAN: 1. The patient is on IVC.     2. We restarted his medications as in the community. VPA 119 as if OD was on Depakote. Will hold depakote, check ammonia level.     3. This patient is still too sedated for an interview.   4. He does not benefit from psychiatric admissions.  5. I will follow up with him tomorrow with a plan to discharge him back to [facility] and follow up with [facility] ACT team and Dr. Omelia BlackwaterHeaden.  Electronic Signatures: Kristine LineaPucilowska, Josefine Fuhr (MD)  (Signed 26-Jul-15 14:40)  Authored: Brief Consult Note   Last Updated: 26-Jul-15 14:40 by Kristine LineaPucilowska, Danny Zimny (MD)

## 2015-03-14 NOTE — Consult Note (Signed)
PATIENT NAME:  [name], Juris D MR#:  [id number] DATE OF BIRTH:  06-30-1976  DATE OF CONSULTATION:  01/20/2014  REFERRING PHYSICIAN: Milagros LollSrikar Sudini, M.D. CONSULTING PHYSICIAN:  A. Wendall MolaMelissa Brodee Mauritz, MD  CHIEF COMPLAINT: Hypoglycemia after suicidal attempt with excessive insulin.   HISTORY OF PRESENT ILLNESS: This is a 39 year old male with a history of severe depression, status post repeated suicidal attempts, who presented after taking 4 pens of Lantus. According to records, he called EMS himself with that complaint. He also took Klonopin and Depakote and drank about 7 beers. He has been seen by psychiatry and medicine. Over the course of his four day hospitalization, he has had repeated hypoglycemia. He was initially on D10 at 100 units/hour and this was titrated down to D5 and stopped yesterday at 11:20 p.m. I believe his IV was no longer patent and it was removed. Since that time, he did have further lows in the 40 to 50s between 4:00 to 7:30 a.m. today. Those were treated with juice and other oral supplements. He has no acute complaints at this time. He does state that he feels poorly when blood sugar is low including having shakes and sweats. He estimates he is eating about half of his typical diet.   PAST MEDICAL HISTORY:  1.  Depression.  2.  Polysubstance abuse.  3.  Personality disorder.   PAST SURGICAL HISTORY: None.   ALLERGIES: None.   SOCIAL HISTORY: He smokes about 1 pack per day of cigarettes. He drinks between 2 to 7 beers daily. He [place] alone [place] and his father lives 2 trailers away.   FAMILY HISTORY: Father has diabetes and takes insulin. Also positive for coronary artery disease.   CURRENT MEDICATIONS:  1.  Tegretol 200 mg daily.  2.  Vasotec 20 mg daily.  3.  Folic acid 1 mg daily.  4.  Magnesium 400 mg daily.  5.  Multivitamin 1 tab daily.  6.  Pantoprazole 40 mg daily.  7.  Thiamine 100 mg daily.  8.  Lovenox 40 mg subcutaneous daily.   REVIEW OF  SYSTEMS: GENERAL: The patient reports about a 10 pound weight loss in the last 3 months. Denies fevers. HEENT: Denies blurred vision. Denies sore throat. NECK: Denies neck pain or dysphagia. CARDIAC: Denies chest pain or palpitation. PULMONARY: Denies cough or shortness of breath. ABDOMEN: Reports fair appetite. Denies nausea, vomiting.  ENDOCRINE: Denies heat or cold intolerance. GENITOURINARY: Denies polyuria or dysuria.  EXTREMITIES: Denies focal weakness or swelling.   PHYSICAL EXAMINATION:  VITAL SIGNS: Height 70.9 inches, weight 200 pounds, BMI 28.0, temperature 98.3, pulse 81, blood pressure 125/70, respirations 18, pulse oximetry 98% on room air.  GENERAL: A white male in no acute distress.  HEENT: EOMI. Oropharynx is clear. Mucous membranes moist.  NECK: Supple. No thyromegaly.  LYMPH: No submandibular or anterior cervical lymphadenopathy.  CARDIAC: Regular rate and rhythm without murmur.  PULMONARY: Clear to auscultation bilaterally. No wheeze.  ABDOMEN: Diffusely soft, nontender, nondistended.  EXTREMITIES: No peripheral edema is present.  NEUROLOGIC: No tremor. No dysarthria.  PSYCHIATRIC: Calm, cooperative.   LABS: No additional labs are available for the last 48 hours other than blood sugars.   ASSESSMENT: Recurrent hypoglycemia after having taken 1200 units of Lantus on February 27  as a suicide attempt. The patient has chronic depression and has had recurrent suicide attempts in the past. Persistent hypoglycemia is due to the long duration of action in this long-acting insulin. Suspect his hyperglycemia will resolve over  the next 24 to 48 hours.   RECOMMENDATIONS:  1.  Supportive care with ongoing regular fingerstick blood sugars and supplemental juice or crackers are sufficient if he has hypoglycemia.  2.  Encouraged good intake during meals. He is agreeable to adding Ensure supplement and yogurt on each meal tray.  3.  Recommend he be monitored until he stops having  hypoglycemia. Nocturnal hypoglycemia is at greatest risk as he is not eating in the overnight hours. It would help to leave snacks at the bedside at all times of the day.   Thank you for the kind request for consultation. I am available if there are any questions.  ____________________________ A. Wendall Mola, MD ams:aw D: 01/20/2014 17:28:37 ET T: 01/21/2014 05:46:07 ET JOB#: [id number]  cc: A. Wendall Mola, MD, <Dictator> Macy Mis MD ELECTRONICALLY SIGNED 01/27/2014 21:02

## 2015-03-14 NOTE — Consult Note (Signed)
Brief Consult Note: Diagnosis: Borderline personality disorder and alcohol abuse.   Patient was seen by consultant.   Consult note dictated.   Recommend further assessment or treatment.   Orders entered.   Discussed with Attending MD.   Comments: Psychiatry: [name] is well known to us. [facility] after extended hospitalization. He yet again shot himself up with insulin while drunk. He used 4 Lantus pens prescribed for his father  containing 1200 Units. Says he did it because he was depresed. Denies suicidal intent. He does not feel suicidal now.   There is now a plan coordinated by community resourses. Guardianship process under way. Last time we "fast-track" transfered [facility] via sheriff.   PLAN: 1. The patient is on IVC.  2. He does not benefit from admission to psychiatry.  3. On Monday we get in contact with his providers in the community.  4. Please continue Seroquel. He was reportedly taking Depakote since [facility]. We order VPA lwevel.   5. We do not recommend benzodiazepines.  6. I will follow along.  Electronic Signatures: Kristine LineaPucilowska, Aleyna Cueva (MD)  (Signed 28-Feb-15 12:18)  Authored: Brief Consult Note   Last Updated: 28-Feb-15 12:18 by Kristine LineaPucilowska, Audrinna Sherman (MD)

## 2015-03-14 NOTE — Consult Note (Signed)
PATIENT NAME:  [name], Sanav D MR#:  [id number] DATE OF BIRTH:  05-Oct-1976  DATE OF CONSULTATION:  07/14/2014  REFERRING PHYSICIAN:  Emergency Room  CONSULTING PHYSICIAN:  Audery AmelJohn T. Malesha Suliman, MD   IDENTIFYING INFORMATION AND REASON FOR CONSULT: A 39 year old man with a history of personality disorder and substance abuse and recurrent parasuicidal behaviors, was brought to the Emergency Room a couple of days ago after ingesting a whole bottle of probably cough syrup, possibly with Benadryl in it. Re-evaluation for appropriate disposition.   HISTORY OF PRESENT ILLNESS: He presented in a confused state after taking an overdose of probably diphenhydramine, possibly dextromethorphan as well. He did this impulsively after buying a bottle of over-the-counter medicine at a store during an outing with his group home. The patient has denied having any suicidal ideation. States that his mood is okay. On evaluation today, he says he is feeling fine now. Does not report depressive symptoms. He says he feels pretty good back at the group home. Admits that it was poor judgement to buy and ingest a bottle of cough syrup, although he tends to downplay the problems associated with it. He is agreeable to continuing his current medicine, which he thinks has been helpful.   PAST PSYCHIATRIC HISTORY: This gentleman has a long and dramatic history of a very large number of episodes in which he overdosed himself with insulin and then brought himself into the hospital. The pattern was odd and very attention seeking, and ultimately did not seem to be about killing himself so much as about gaining rehospitalization and then attention when he was frustrated and intoxicated. Ultimately, he has been placed under guardianship, and is now residing in a group home. He has still made impulsive overdose attempts since being in the group home, but it seems to be getting under better control.   SOCIAL HISTORY:  He now has a guardian. He  resides in a group home. Says he likes it reasonably well. Does not have any specific complaints.   PAST MEDICAL HISTORY: The patient does not have diabetes; instead, he used to overdose on insulin that he had gotten from his father. He has gastric reflux symptoms. High blood pressure as well. Otherwise, no really significant ongoing medical problems.   FAMILY HISTORY: No clear family history of mental illness.   SUBSTANCE ABUSE HISTORY: He has a history of repeated abuse of alcohol, which has often been related to his impulsive dangerous behavior.   CURRENT MEDICATIONS: Depakote 500 mg at night, trazodone 200 mg at night, naltrexone 50 mg once a day at night, bupropion XL 300 mg per day, Nexium 20 mg per day, Norvasc 10 mg per day, lorazepam 2 mg 3 times a day, hydroxyzine 50 mg 4 times a day, Seroquel 50 mg twice a day.   ALLERGIES: No known drug allergies.   REVIEW OF SYSTEMS: Today he has no complaints. Denies suicidal ideation. Denies hallucinations. Denies depression. No physical complaints.   MENTAL STATUS EXAMINATION: Slightly disheveled gentleman. Looks his stated age, cooperative with the interview. Good eye contact. Normal psychomotor activity. Speech normal rate, tone, and volume. Affect smiling, reactive, upbeat, appropriate. Mood stated as good. Thoughts are lucid without loosening of associations or delusions. Denies auditory or visual hallucinations. Denies suicidal or homicidal ideation. He has chronic poor judgement, but his insight is improved. Alert and oriented x 4. Short and long-term memory intact to testing.   LABORATORY RESULTS: Salicylates and acetaminophen negative. Alcohol negative. Chemistry Panel: Low sodium 134; otherwise, unremarkable.  CBC normal. EKG sinus bradycardia; otherwise, unremarkable. Urinalysis unremarkable. Drug screen positive for tricyclics and MDMA, which probably represent his prescription medicine.   ASSESSMENT: This a 39 year old man with a  personality disorder and substance abuse. Impulsively bought and consumed a bottle of over-the-counter cold medicine in order to get intoxicated. No indication of any wish to actually harm himself. Mental status is currently at his baseline, without acute dangerousness. Does not require hospital level treatment.   TREATMENT PLAN: Discontinue involuntary commitment. Review with the patient how negative his period of self injury behavior was, and he acknowledges that he is doing better currently. He agrees that he will try to have better self control in the future. He has been ACT team working with him, so he already has good outpatient care. Discussed the case with the Emergency Room physician. The patient can be taken off commitment and released.   DIAGNOSIS, PRINCIPAL AND PRIMARY:  AXIS II: Borderline personality disorder.   SECONDARY DIAGNOSES:  AXIS I: Alcohol dependence.  AXIS III: No diagnosis.    ____________________________ Audery Amel, MD jtc:MT D: 07/14/2014 23:35:11 ET T: 07/15/2014 07:15:57 ET JOB#: [id number]  cc: Audery Amel, MD, <Dictator> Audery Amel MD ELECTRONICALLY SIGNED 08/01/2014 16:58

## 2015-03-14 NOTE — H&P (Signed)
[name] NAME:  [name], [name] MR#:  [id number] DATE OF BIRTH:  11/12/1976  DATE OF ADMISSION:  05/29/2014  REFERRING PHYSICIAN: Emergency Room M.D.   ATTENDING PHYSICIAN: Kristine Linea, M.D.   IDENTIFYING DATA: [name] is a 39 year old male with history of depression, psychosis and mood instability and multiple suicide attempts, and substance abuse.   CHIEF COMPLAINT: "I have nothing to live for."   HISTORY OF PRESENT ILLNESS:  [name] is well known to [place]. He has been hospitalized at [facility] multiple times for worsening of depression and insulin overdose. About 6 months ago, he was deemed incompetent and has a guardian now. He was placed in a group home. He has no longer access to insulin and he has not been frequenting our Emergency Room. [name] reports that  2-1/2 months ago his father had passed away. They did not have a good relationship but oftentimes worsening of his father's health condition would precipitate suicide attempts. [name] told Shane group home that he has nothing to live for and he is going cut his wrists, walk into traffic or sit in front of Shane train.  He is determined to die.    At Shane group home, he punched Shane wall and a van.  He does have a history of violent behavior, but no violence towards people. He also was drinking on Shane night of admission. He states that his medications do not work, but I doubt if he was compliant, as his Depakote level on admission was nonexistent. He has been in Shane care of Dr. Omelia Blackwater and his [facility] ACT team for Shane past several months actually doing pretty well.   In Shane past, his father was  Shane source of insulin that [name] used for overdose, so at least we do not need to worry about that any longer. [name] seems not to understand that he has a guardian now and has no decision-making right.  He opposes returning to Shane group home, he hates it there.  He hates Shane rules and he would rather  die than return to his group home. He endorses many symptoms of depression with poor sleep, decreased appetite, anhedonia, feeling of guilt, hopelessness, worthlessness, crying spells, social isolation.  He, indeed, looks depressed, presented with severe psychomotor retardation.  Usually, he is talkative, but he hardly speaks to me at all.   He denies psychotic symptoms at Shane moment.  There is no symptoms suggestive of bipolar mania. He denies, other than alcohol, substance use. His blood alcohol level was slightly elevated. He is only positive for benzodiazepines from prescribed Xanax and MDMA.   PAST PSYCHIATRIC HISTORY: Multiple hospitalizations since a young age, almost weekly admission to Shane hospital several months ago.  Lately, able to stay away in Shane hospital while in Shane group home and in Shane care of ACT team. There were multiple suicide attempts by medication overdose including insulin.   FAMILY PSYCHIATRIC HISTORY: None reported.   PAST MEDICAL HISTORY: Hypertension.   ALLERGIES: No known drug allergies.   MEDICATIONS ON ADMISSION: Trazodone 200 mg at bedtime, Seroquel XR 200 mg at bedtime, pantoprazole 40 mg daily, Depakote 500 mg daily, Antabuse 500 mg daily, amlodipine 2.5 mg daily.   SOCIAL HISTORY: As above.  He is disabled from mental illness.  He is an orphan now.  He is an incompetent adult and has a guardian. He lives in a group home. He has a very little support at Shane moment.  REVIEW OF SYSTEMS:  CONSTITUTIONAL: No fevers or chills. No weight changes.  EYES: No double or blurred vision.  ENT: No hearing loss.  RESPIRATORY: No shortness of breath or cough.  CARDIOVASCULAR: No chest pain or orthopnea.  GASTROINTESTINAL: No abdominal pain, nausea, vomiting, or diarrhea.  GENITOURINARY: No incontinence or frequency.  ENDOCRINE: No heat or cold intolerance.  LYMPHATIC: No anemia or easy bruising.  INTEGUMENTARY: No acne or rash.  MUSCULOSKELETAL: No muscle or joint  pain.  NEUROLOGIC: No tingling or weakness.  PSYCHIATRIC: See history of present illness for details.   PHYSICAL EXAMINATION: VITAL SIGNS: Blood pressure 100/65, pulse 69, respirations 16. GENERAL:  This is a well-developed male in no acute distress.  HEENT: Shane pupils are equal, round, and reactive to light. Sclerae are anicteric.  NECK: Supple. No thyromegaly.  LUNGS: Clear to auscultation. No dullness to percussion.  HEART: Regular rhythm and rate. No murmurs, rubs, or gallops.  ABDOMEN: Soft, nontender, nondistended. Positive bowel sounds.  MUSCULOSKELETAL: Normal muscle strength in all extremities.  SKIN: No rashes or bruises.  LYMPHATIC: No cervical adenopathy.  NEUROLOGIC: Cranial nerves II through XII are intact.   LABORATORY DATA: Chemistries are within normal limits. Sodium 134, potassium of 3. Blood alcohol level is 0.053. LFTs within normal limits. Depakote level less than 3. Urine tox screen positive for benzodiazepines and MDMA. CBC within normal limits. Urinalysis is not suggestive of urinary tract infection. Serum acetaminophen less than 2. Serum salicylates 3.1.   MENTAL STATUS EXAMINATION ON ADMISSION: [name] is asleep and not easy to arouse. He is groggy, hardly cooperative. He recognizes me from previous admissions. There is severe psychomotor retardation. There is no eye contact. His speech is slow and of low volume. His mood is depressed with flat affect. Thought process is logical. Thought content: He endorses suicidal ideation, but is able to contract for safety in Shane hospital. There are no thoughts of hurting others. There are no delusions or paranoia. There are no auditory or visual hallucinations. His cognition is impossible to assess. He is of below-average intelligence and fund of knowledge. His insight and judgment are extremely poor.   SUICIDE RISK ASSESSMENT: This is a [name] with a long history of depression, mood instability and multiple suicide attempts,  who complains of worsening of depression in Shane context of major loss,  drinking and medication noncompliance.   INITIAL DIAGNOSES:  AXIS I: Schizoaffective disorder, bipolar type.  AXIS II: Borderline personality disorder.  AXIS III: Hypertension.  AXIS IV: Mental illness, recent loss, alcohol abuse.  AXIS V: Global assessment of functioning 25.   PLAN: [name] was admitted to [facility] for safety, stabilization and medication management. He was initially placed on suicide precautions and was closely monitored for any unsafe behaviors. He underwent full psychiatric and risk assessment. He received pharmacotherapy, individual and group psychotherapy, substance abuse counseling, and support from therapeutic milieu.  1.  Suicidal ideation. He is able to contract for safety.  2.  Mood:  We will initially continue all medications as prescribed by Dr. Omelia BlackwaterHeaden.  He has been on Seroquel mostly. He is never compliant with medicine.  Will use trazodone for sleep. He is on Depakote and noncompliant. Will discuss with [name].  3.  Alcohol use:  [name] is in a group home. He has not been drinking heavily enough to require detoxification. Will monitor for symptoms of withdrawal.  4.  Medical:  He is hypertensive. Will continue amlodipine.  5. Substance abuse treatment:  This is a longstanding problem with this [name].  He had numerous admissions to [facility] rehabilitation facility with no improvement.  6.  Disposition:  He will likely return to his group home when stable.    ____________________________ Braulio Conte B. Jennet Maduro, MD jbp:ds D: 05/29/2014 13:54:44 ET T: 05/29/2014 14:18:05 ET JOB#: [id number]  cc: Tamorah Hada B. Jennet Maduro, MD, <Dictator> Shari Prows MD ELECTRONICALLY SIGNED 06/12/2014 7:18

## 2015-03-14 NOTE — Consult Note (Signed)
PATIENT NAME:  [name], [name] MR#:  [id number] DATE OF BIRTH:  21-Jul-1976  DATE OF CONSULTATION:  06/16/2014  REFERRING PHYSICIAN:  Charlestine NightPhillip A. Scotty CourtStafford, MD CONSULTING PHYSICIAN:  Manna Gose B. Mallorie Norrod, MD  REASON FOR CONSULTATION: To evaluate the patient after a suicide attempt.   IDENTIFYING DATA: [name] is a 39 year old male with a history of borderline personality disorder, mood instability, and multiple suicide attempts.   CHIEF COMPLAINT: "I'm fine."  HISTORY OF PRESENT ILLNESS: [name] is well known to us. For the past 2-3 years, he has been admitted to the hospital frequently for multiple suicide attempts, most of the time by medication overdose, especially insulin. By court order he was placed in a group home named [facility], where he has been residing for the past several months. During that time the frequency of his hospitalizations and suicide attempts has decreased up until recently, when his father passed away in May. The patient was hospitalized at [facility] for worsening depression and suicidal ideation in the middle of July. He was discharged on July the 13th. He dislikes his group home and is rather unhappy that he has no access to his money. [place] but does not know his guardian yet. He has asked him to support him in the community. Reportedly, he left his group home and went back to the place where he used to reside, and where his trailer still can be found. Reportedly, he had medication and insulin put away in this area and overdosed on medicines. When he was brought to the Emergency Room, he claimed that he lives independently, denied having mental illness or ever seeing a psychiatrist. Today he denies that he ever made it to his trailer. I did see him on the 25th and 26th, but he was initially too sedated and unable to participate in the interview. By today he is pleasant, polite, and fully oriented; denies any  symptoms of mental illness; denies suicidal or homicidal ideation, and is ready to discharge. [name] overdosed on unknown medications in the context of some drinking. He does have a history of alcoholism and has been hospitalized multiple times for detox as well as residential substance abuse treatment. He has been hospitalized short- and long-term for his mental illness, as well. Unfortunately, he does not benefit from hospitalizations whether he stays in the hospital for 3 days or for 30 days. He oftentimes returns the following day after another suicide attempt. He denies depressive symptoms. There is no psychosis. He denies, other than alcohol, substance use.   PAST PSYCHIATRIC HISTORY: He was first hospitalized at the age of 89, when his parents divorced. There were multiple hospitalizations for depression, mood instability, and substance abuse. He has been tried on numerous medications. Lately, Dr. Omelia BlackwaterHeaden from his ACT team has him on Depakote and Wellbutrin. The patient always liked and oftentimes misused benzodiazepine; he is on low-dose Xanax. There were multiple suicide attempts as above.  FAMILY PSYCHIATRIC HISTORY: There is some depression.   PAST MEDICAL HISTORY: Obesity, GERD.  ALLERGIES: No known drug allergies.   MEDICATIONS ON ADMISSION: Xanax 0.5 mg 3 times daily, Wellbutrin XL 300 mg daily, Depakote 1500 at bedtime, Atarax 25 mg 4 times daily, pantoprazole 40 mg daily, trazodone 200 mg daily.   SOCIAL HISTORY: As above. He is an incompetent adult, guardian of the state. He lives in [facility] group home.   REVIEW OF SYSTEMS:  CONSTITUTIONAL: No fevers or chills.  No weight changes.  EYES: No double or blurred vision.  ENT: No hearing loss.  RESPIRATORY: No shortness of breath or cough.  CARDIOVASCULAR: No chest pain or orthopnea.  GASTROINTESTINAL: No abdominal pain, nausea, vomiting, or diarrhea.  GENITOURINARY: No incontinence or frequency.  ENDOCRINE: No heat or cold  intolerance.  LYMPHATIC: No anemia or easy bruising.  INTEGUMENTARY: No acne or rash.  MUSCULOSKELETAL: No muscle or joint pain.  NEUROLOGIC: No tingling or weakness.  PSYCHIATRIC: See history of present illness for details.   PHYSICAL EXAMINATION:  VITAL SIGNS: Blood pressure 113/59, pulse 56, respirations 18, temperature 97.9.  GENERAL: This is a slightly obese, middle-aged male in no acute distress. The rest of the physical examination is deferred to his primary attending.   LABORATORY DATA: Chemistries: Glucose 100, sodium 131, potassium 3.1. Blood alcohol level is 0.086. LFTs within normal limits. Depakote level on admission 119. Urine tox screen is positive for opioids and MDMA. CBC within normal limits. Urinalysis is not suggestive of urinary tract infection. Serum acetaminophen less than 2. Serum salicylate 3.1.   EKG: Normal sinus rhythm, incomplete right bundle branch block, septal infarct of undetermined age, prolonged QT.  The patient had blood glucose monitored throughout his stay; it was never below normal.   MENTAL STATUS EXAMINATION: The patient is alert and oriented to person, place, time, and situation. He is pleasant, polite, and cooperative. He is cool and collected, He maintains good eye contact. He recognizes me from previous encounters. His speech is soft. Mood is fine, with flat affect. Thought process is logical with its own logic. He denies thoughts of hurting himself or others. There are no delusions or paranoia. There are no auditory or visual hallucinations. His cognition is grossly intact. Registration, recall, short- and long-term memory are intact except for short-term memory. The patient gives a different account depending on who is asking. He is of average intelligence and fund of knowledge. His insight and judgment are poor.   DIAGNOSES:  AXIS I: Mood disorder not otherwise specified.  AXIS II: Borderline personality disorder.  AXIS III: Gastroesophageal  reflux disease.  AXIS IV: Mental illness.  AXIS V: Global assessment of functioning 55.   PLAN: 1.  The patient no longer meets criteria for involuntary inpatient psychiatric commitment. I will terminate proceedings. Please discharge as appropriate.  2.  He is to continue all medications as in the community. 3.  He will follow up with ACT team and Dr. Omelia Blackwater.  4.  He will return to [facility] group home.   ____________________________ Ellin Goodie. Jennet Maduro, MD jbp:sk [name]: 06/16/2014 19:17:21 ET T: 06/16/2014 23:40:18 ET JOB#: [id number]  cc: Chelci [place]ute B. Jennet Maduro, MD, <Dictator> Shari Prows MD ELECTRONICALLY SIGNED 06/21/2014 7:27

## 2015-03-14 NOTE — Consult Note (Signed)
PATIENT NAME:  [name], [name] MR#:  [id number] DATE OF BIRTH:  01/29/1976  DATE OF CONSULTATION:  09/12/2014  CONSULTING PHYSICIAN:  Audery Amel, MD  IDENTIFYING INFORMATION AND CHIEF COMPLAINT: A 39 year old man brought in under involuntary commitment having threatened to kill himself at his group home.   CHIEF COMPLAINT: "I wish is feeling bad and had a bad day."   HISTORY OF PRESENT ILLNESS: Information obtained from the patient and the chart. This gentleman with a long history of chronic depression, personality disorder and a history of substance abuse came to the hospital having stated that he was thinking of killing himself. He apparently did not actually do anything to try and harm himself, but stated that he felt like he had nothing to live for and that he wanted to die. He indicates that yesterday was a bad day for him. He was reminded of his father's recent death and was feeling negative about that. He started having suicidal thoughts. Denies having any psychotic symptoms. He had not been using any alcohol or drugs. He says that overall he feels like things are going pretty well for him. He is settled into the group home. He is working with the ACT team. He says he is medication compliant. He tells me now that, since coming to the Emergency Room when he first was saying that he was suicidal, he has calmed down and no longer has any suicidal ideation. He feels much more stable and relaxed and optimistic.   PAST PSYCHIATRIC HISTORY: Mr. Maler has a long history of a dramatically large number of suicide attempts. During 2014 and prior to that, he had probably somewhere in the vicinity of 30 admissions to the hospital for suicide attempt by overdose of insulin. He ended up having 2 admissions to the [facility] as well as several lengthy admissions to the psychiatry and medical services here. Eventually the State had him declared incompetent and got a guardianship and could place him into a  group home. Since then, his behavior and mood have improved tremendously. His diagnosis, in my mind, has always been severe personality disorder, borderline intellectual impairment and alcohol abuse. More recently, it sounds like he is feeling specifically sad about his father's death, but still does not seem to be having a major depression.   SUBSTANCE ABUSE HISTORY: He used to frequently abuse beer when he was feeling down, which would just make things worse and make him more impulsive. Since being in the group home, he says he is not drinking anymore. He does not make the effort to try and get out and get it. There has been some question about his misuse of prescription medicine but he seems to be doing okay with it at this point.   FAMILY HISTORY: He does have a positive family history of depression.   PAST MEDICAL HISTORY: The patient has reflux disease but does not have diabetes and does not have coronary artery disease, seems to otherwise be in good health.   SOCIAL HISTORY: The patient has been declared incompetent, has state-appointed guardian and is now residing in a group home. He no longer has any family living who are really close to him.   CURRENT MEDICATIONS: The intake reconciliation indicates Depakote 500 mg at night.  It still says Ativan 2 mg 3 times a day, although he claims it's Valium, but he could be mistaken, trazodone 200 mg at night. Seroquel 50 mg twice a day, hydroxyzine 1 tablet 4 times a day,  Norvasc 12.5 mg a day and naltrexone 50 mg at night, Nexium 20 mg once a day, Wellbutrin extended-release 300 mg once a day.   ALLERGIES: No known drug allergies.   REVIEW OF SYSTEMS: Mood is still a little bit down but much better. Denies suicidal or homicidal ideation. Denies any hallucinations. Not reporting any acute physical symptoms. Otherwise noncontributory.   MENTAL STATUS EXAMINATION: A somewhat disheveled gentleman, looks his stated age, who is cooperative with the  interview, makes good eye contact. Normal psychomotor activity except for a chronic tremor, mostly in his right hand. Speech is normal except for being a little bit quiet. Affect is blunted but that is pretty normal for him at baseline. Mood stated as being better. Thoughts are generally lucid, slow and simple and concrete which is normal for him. He is able to remember 3/3 objects immediately and at 2 minutes. Long-term memory grossly intact. Judgment and insight improved, adequate to his situation. Alert and oriented x4.  Denies any suicidal or homicidal ideation currently.    LABORATORY RESULTS: Drug screen is positive for MDMA which is probably the result of his Wellbutrin. Chemistry panel:  Unremarkable. Alcohol negative. Hematology panel all normal. Urinalysis unremarkable.   PHYSICAL EXAMINATION:  Blood pressure 103/62, respirations 18, pulse 80, temperature 98. He has a chronic tremor, especially in his right arm, some wasting of the musculature in his right arm, otherwise nothing remarkable.   ASSESSMENT:  A 39 year old man with chronic depression and personality disorder, borderline intellectual impairment. Yesterday was stating suicidal ideation but had not acted on it. He actually dealt with it appropriately under the circumstances, telling staff and treatment providers about it. Since then, he has had about 12 hours to rest up, have something to eat, relax and now says that he is feeling back to normal, totally denies suicidal ideation, not psychotic, agreeable to continuing outpatient treatment, not abusing substances. He has a safe place to live and good outpatient treatment in place. Does not require hospitalization.   TREATMENT PLAN: Supportive counseling done. Reviewed his medicine, which seems all quite appropriate. He has ACT team monitoring that frequently. The patient was given encouragement about continuing to stay mentally active and to avoid alcohol use. He can be released from the  Emergency Room and IVC discontinued.   DIAGNOSIS, PRINCIPAL AND PRIMARY:  AXIS I: Adjustment disorder, with depressed mood.   SECONDARY DIAGNOSES: AXIS I: Alcohol abuse, in remission. AXIS II:  Borderline personality disorder.  Borderline cognitive impairment thinks    ____________________________ Audery AmelJohn T. Soham Hollett, MD jtc:jp D: 09/12/2014 15:29:28 ET T: 09/12/2014 16:32:19 ET JOB#: [id number]  cc: Audery AmelJohn T. Ellory Khurana, MD, <Dictator> Audery AmelJOHN T Vlad Mayberry MD ELECTRONICALLY SIGNED 09/27/2014 14:50

## 2015-03-14 NOTE — Consult Note (Signed)
PATIENT NAME:  [name], [name] MR#:  [id number] DATE OF BIRTH:  1976-04-07  DATE OF CONSULTATION:  11/27/2013  CONSULTING PHYSICIAN:  Audery Amel, MD  IDENTIFYING INFORMATION AND REASON FOR CONSULTATION: A 39 year old man currently in the hospital, recovering from a self-administered overdose of insulin. Consultation for evaluation of psychiatric treatment.   HISTORY OF PRESENT ILLNESS: Information obtained from the chart and the patient. I also have evaluated this patient several times in the past under similar circumstances and am familiar with his situation. I have worked with him on the inpatient unit in the past and attended a multidisciplinary meeting yesterday discussing his overall care. The patient came into the hospital on January 4th, having administered insulin to himself. He administered himself a very large dose of insulin and then called 911 himself and was cooperative with EMS bringing him into the hospital. As would be expected, he had very low blood sugars when he came in, and he has been treated for that and has been stabilizing. On interview today, the patient states that his mood is feeling better. He states he is not feeling depressed. He does appear to be lethargic. Says that he still does not rest all that well in the hospital. He is not reporting any hallucinations and does not show evidence of any psychotic thinking. The patient denies any suicidal ideation. Denies that he wants to die. He says that at the time that he took the injection, he did not want to die. On the other hand, he cannot give any actual explanation for what he was thinking at the time. He says that he has never really thought much about why he does what he does. He knows that he frequently does it when he is drinking. The patient says that he still drinks beer regularly and does not feel much motivation to stop drinking. He has not attempted to harm himself since being in the hospital.   PAST PSYCHIATRIC  HISTORY: This is over 20 episodes in the past year of this patient doing virtually exactly the same thing. He has ready access to a supply of insulin belonging to his father, and apparently the family does not have the will to prevent the patient from getting his hands on it. The patient frequently overdoses on insulin and invariably has been cooperative with treatment, usually calling 911 himself to bring himself in the hospital, which would suggest that he is not actually wanting to die. He does not try to hurt himself in the hospital. The patient typically, once he stabilizes medically, returns to a mental state in which he is not reporting symptoms of depression and not reporting suicidal ideation. He has been treated in the past year with inpatient hospitalization, both short- and long-term, as well as transfer to the state hospital for even longer-term treatment. He has been in the hospital for up to several months at a time. He has been on several antidepressants in the past; however, typically, once he is stabilized from his medical episode, he will not report clear symptoms of depression, so it is not clear whether medications have been of any help. [facility] and referred for substance abuse treatment. He has even now been given services with an ACT team who visit him regularly at home. Despite all this, he has not changed any of his behavior. My assessment of this patient, having worked with him many times, is that his  primary problem is a personality disorder combined with low baseline. intellect and alcohol abuse, rather than a specifically treatable mood disorder.   FAMILY HISTORY: He has not reported family history.   PAST MEDICAL HISTORY: The patient does not actually have diabetes himself. 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   SOCIAL HISTORY:  The patient lives alone in a trailer in a cluster of buildings belonging to other members of his family. His father is his closest relative and lives nearby. The patient has a girlfriend, with whom he has an on-again, off-again relationship. Evidently, she is not living at home right now. He does receive financial support and has insurance for medical coverage.   SUBSTANCE ABUSE HISTORY: Long-standing history of alcohol abuse. No history of DTs or seizures. Probably does at times black out from his alcohol use. Does not routinely use other drugs, especially not recently. He has been offered extensive substance abuse services and has not made consistent use of them.   CURRENT MEDICATIONS: Amlodipine 10 mg a day, Wellbutrin XL 300 mg per day, clonazepam 2 mg t.i.d. p.r.n. for anxiety.   ALLERGIES: No known drug allergies.   REVIEW OF SYSTEMS: Denies being depressed. Denies suicidal ideation. Not reporting psychotic symptoms. Denies being in pain. Appetite has improved. Says he has no intention or desire to hurt himself.   MENTAL STATUS EXAMINATION: Disheveled gentleman interviewed in his hospital room. He was asleep when we came in, but woke up readily and was passively cooperative with the interview. Eye contact was adequate. Psychomotor activity sluggish. Affect blunted. Mood stated as better. Thoughts are slow. Limited in elaboration. Did not make any bizarre or psychotic statements. Denies hallucinations. Denies suicidal or homicidal ideation. The patient's insight is present in so far as he understands that his alcohol abuse is a problem and is related to his suicide attempts. He also has a clear understanding that his behaviors could easily cause him to die. His judgment about this is clearly impaired by most people's standards, but he does understand the risk that he is running. The patient is of low chronic baseline cognitive ability, although he does not have a mental retardation diagnosis. He is  currently alert and oriented.   VITAL SIGNS: Blood pressure currently 109/68, respirations 18, pulse 68, temperature 98.2.   LABORATORY RESULTS: He had a drug screen on admission that was positive for MDMA. That is almost certainly a result of his being on Wellbutrin, which is known to cross react with that test. He had a blood alcohol level of 183.   ASSESSMENT: A 39 year old man who has a difficult to treat condition in which he is abusing alcohol and repetitively doing self-injury behaviors in a way that could potentially be fatal for him. He is denying suicidal ideation and has denied suicidal intent or ideation on almost all of the times that I have spoken with him about this behavior. His behavior around the attempts does not suggest a serious intention to die. He understands the risks that he is running with this, including death. The patient does not have a full syndrome of major depression nor is he psychotic. He has already been tried with treatment on the inpatient psychiatric unit as well as longer-term treatment, and it has not been of benefit, and in fact, has probably been counterproductive to him and disruptive to the milieu as the patient does not attend groups or make use of appropriate treatment.  At this point, the patient is not likely to benefit from inpatient hospitalization and is not reporting acute suicidal ideation. He is clearly at continued risk for repeating the behavior that he has been doing, and he understands and acknowledges this. He understands and acknowledges that if he could stop drinking, it would probably help, but tells [place] that he really does not have any interest in stopping drinking, and he would like to continue doing it. It has been my assessment of this patient that personality disorder and substance abuse are the primary issues.   TREATMENT PLAN: I do not believe that it would be helpful to move him to the inpatient psychiatry ward. He has intensive ACT team  services available in the community. He is seeing a psychiatrist regularly in the community and has been on medication prescribed by his psychiatrist. I do have one idea that may help possibly with some of the alcohol use. I would recommend that we start him on naltrexone at 50 mg a day. This medication has some evidence to support that it can decrease the amount of drinking or the frequency of relapse of people. The evidence is all in patients who are seriously participating in psychosocial programs, which would decrease the chance of its effectiveness for Mr. Heavin, but I think the potential benefit would be worth the risk. Additionally, opiate-blocking medicines such as naltrexone have occasionally been used in some studies to try and decrease impulsive self-injuring behavior with mixed results. We have discussed at a full psychosocial meeting this patient's plan of treatment. A proposal was raised by the hospital social worker that we consider outpatient commitment. I would be happy to fill out the paperwork advising outpatient commitment. I am not sure if the system is in place, and I have asked the ACT team or the social work team to look into seeing if that is possible.   DIAGNOSIS, PRINCIPAL AND PRIMARY:  AXIS I: Alcohol dependence.   SECONDARY DIAGNOSES:  AXIS I: Substance-induced mood disorder.  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  AXIS III: Recent overdose on insulin, history of high blood pressure.  AXIS IV: Severe from his isolation and boredom at home.  AXIS V: Functioning at time of evaluation 44.   ____________________________ Audery Amel, MD jtc:lb D: 11/27/2013 11:36:03 ET T: 11/27/2013 11:57:31 ET JOB#: [id number]  cc: Audery Amel, MD, <Dictator> Audery Amel MD ELECTRONICALLY SIGNED 12/02/2013 16:33

## 2015-03-14 NOTE — Consult Note (Signed)
Brief Consult Note: Diagnosis: Borderline personality disorder and alcohol abuse.   Patient was seen by consultant.   Consult note dictated.   Recommend further assessment or treatment.   Orders entered.   Discussed with Attending MD.   Comments: Psychiatry: [name] is well known to us. [facility] after extended hospitalization. He yet again shot himself up with insulin while drunk. He used 4 Lantus pens prescribed for his father  containing 1200 Units. Says he did it because he was depresed. Denies suicidal intent.   MSE: Alert, oriented, pleasant, adequately groomed, good eye contact, normal speech, good mood, bright affect, denies suicidal/homicidal ideation, delusions, paranoia, hallucinations. Cognition intact, registers 3/4, recalls 2/3 objects after 3 minutes, knows current president, below average intelligence, good fund of knowledge, poor insight and judgment.     PLAN: 1. The patient no longer meets criteria for IVC. I will terminate proceedings. Please discharge as appropriate.  2. He is to continue Seroquel 400 mg at bedtime.   3. He will follow up with [facility] ACT team and Dr. Omelia BlackwaterHeaden.   4. They probably could pick him up.  Electronic Signatures: Kristine LineaPucilowska, Jonette Wassel (MD)  (Signed 03-Mar-15 12:32)  Authored: Brief Consult Note   Last Updated: 03-Mar-15 12:32 by Kristine LineaPucilowska, Keyani Rigdon (MD)

## 2015-03-14 NOTE — Consult Note (Signed)
Brief Consult Note: Diagnosis: adjustment do.   Patient was seen by consultant.   Consult note dictated.   Orders entered.   Discussed with Attending MD.   Comments: Psychiatry: See full note. No longer suicidal. CAlm and appropriate. Will release from IVC and discharge from ER to further ACT team care.  Electronic Signatures: Audery Amellapacs, Korianna Washer T (MD)  (Signed 23-Oct-15 15:31)  Authored: Brief Consult Note   Last Updated: 23-Oct-15 15:31 by Audery Amellapacs, Madina Galati T (MD)

## 2015-03-14 NOTE — Discharge Summary (Signed)
PATIENT NAME:  [name], Kelden D MR#:  [id number] DATE OF BIRTH:  [birth year]  DATE OF ADMISSION:  11/24/2013 DATE OF DISCHARGE:  11/27/2013  ADMITTING DIAGNOSIS: Overdose on insulin.   DISCHARGE DIAGNOSES:  1. Overdose on insulin due to attention-seeking behavior with stated suicidal attempt. This would be one of the many admissions that he has had to the hospital. Seen by psychiatry. They feel that there are not many other options for this patient in terms of other interventions.  2. Depression.  3. Alcohol abuse.  4. Hyponatremia, resolved with IV fluids.  5. Hypertension.  6. Borderline personality.  7. Chronic pain syndrome.   CONSULTANTS: Dr. Toni Amendlapacs.   PERTINENT LABORATORIES AND EVALUATIONS: Admitting EKG normal sinus rhythm. Tylenol level less than 2. Alcohol level 0.183. Glucose 69, BUN 4, creatinine 0.83, sodium 133, potassium 4, chloride 104, CO2 21.  tuds were positive for MDMA. Chest x-ray was normal.   HOSPITAL COURSE: Please refer to H and P done by the admitting physician. The patient is a 39 year old white male who has had numerous admissions to the hospital for similar type of presentation, with overdose on insulin and then he calls EMS stating that he wanted to kill himself. The patient gets admitted for hypoglycemia. He gets placed on D10, and then his sugars improve. He has been committed multiple times by psychiatry to behavioral unit. He was brought in with similar complaints. His sugars were stabilized. He was medically stable. He was seen by Dr. Toni Amendlapacs who states that the patient will not benefit from any inpatient treatment now since he has received so many treatments and his behavior has continued. He recommended outpatient ACT followup. The patient is insisting on being discharged. He is not committable anymore, and he is not suicidal. Therefore, he is being discharged.   DISCHARGE MEDICATIONS: Bupropion 200 daily, clonazepam 2 mg 1 tab p.o. t.i.d., carbamazepine 200 mg  at bedtime, quetiapine 400 at bedtime, amlodipine 10 daily, enalapril 20 daily, naltrexone 50 daily.   DIET: Low sodium.   ACTIVITY: As tolerated.   FOLLOWUP: With primary MD in 1 to 2 weeks. Follow with ACT program.   NOTE: Thirty-five minutes spent on the discharge.    ____________________________ Lacie ScottsShreyang H. Allena KatzPatel, MD shp:gb D: 11/28/2013 16:05:50 ET T: 11/28/2013 22:21:16 ET JOB#: [id number]  cc: Breleigh Carpino H. Allena KatzPatel, MD, <Dictator> Charise CarwinSHREYANG H Cherl Gorney MD ELECTRONICALLY SIGNED 11/29/2013 12:45

## 2015-03-14 NOTE — Discharge Summary (Signed)
PATIENT NAME:  [name], [name] MR#:  [id number] DATE OF BIRTH:  [birth year]  DATE OF ADMISSION:  11/28/2013 DATE OF DISCHARGE: 12/02/2013   ADMISSION DIAGNOSIS: Insulin overdose, intentional.   DISCHARGE DIAGNOSES:  1.  Insulin overdose, intentional with multiple hospitalizations for the same issue.  2.  Suicidal ideation.  3.  History of personality disorder.  4.  History of hypertension.   CONSULTATIONS: Psychiatry.   HOSPITAL COURSE: This is a 39 year old male, who is well known to the hospitalist service, who had several multiple hospitalizations within the past month with the same problem of insulin overdose as a suicidal gesture. For further details, please refer to the H and P. 1.  Insulin overdose, intentional as a gesture for suicidal ideation. This is a recurrent event. The patient has been here multiple times within the past month even. He was initially placed in the CCU on Glucovance and D10. He has been off these drips since 11/29/2013 and blood sugars have remained pretty much stable. He eats when he wants to eat. When he is not eating, his blood sugars do drop, but he is asymptomatic.  2.  Suicidal ideation. The patient did have a Comptrollersitter.  3.  Alcohol abuse. The patient did start drinking again; however, he was just discharged prior to this readmission and so we have not placed him on CIWA. No acute events of alcohol detox.  4.  Tobacco use. The patient is counseled to stop smoking. He does not want to stop smoking. 5.  Depression and personality disorder. This is the patient's main issue. Apparently after several meetings with administration and top staff, the [facility]  6.  History of hypertension. The patient was continued on Norvasc and enalapril.   DISCHARGE MEDICATIONS:  1.  Bupropion 300 mg daily.  2.  Clonazepam 2 mg b.i.[name].  3.  Carbamazepine 200 mg daily.  4.  Quetiapine 40 mg at bedtime.  5.  Norvasc 10 mg daily.  6.  Enalapril 20 mg  daily.   DISCHARGE DIET: Low sodium, regular consistency.   DISCHARGE DISPOSITION: The patient is going to be [facility]. He was placed on a psych hold here until the transfer occurred. The patient is medically stable for discharge.   TIME SPENT: Approximately 35 minutes.  ____________________________ Janyth ContesSital P. Juliene PinaMody, MD spm:aw [name]: 12/01/2013 15:33:10 ET T: 12/02/2013 09:17:09 ET JOB#: [id number]  cc: Mariely Mahr P. Juliene PinaMody, MD, <Dictator> Janyth ContesSITAL P Vashti Bolanos MD ELECTRONICALLY SIGNED 12/02/2013 14:22

## 2015-03-14 NOTE — H&P (Signed)
PATIENT NAME:  [name], [name] MR#:  [id number] DATE OF BIRTH:  Jun 13, 1976  DATE OF ADMISSION:  11/24/2013  PRIMARY DOCTOR:  Dr. Zada Finderslmedo.  EMERGENCY ROOM PHYSICIAN: Dr. Scotty [name].     CHIEF COMPLAINT: Overdose of Lantus.   HISTORY OF PRESENT ILLNESS:  A 39 year old male well-known to our service for repeated Lantus overdose with suicidal ideation, came in because of the same problem. The patient took about 1300 units of Lantus at home around 4:00 p.m. along with 400 mg of Lyrica. The patient says he felt depressed, trying to hurt himself. He called the ambulance at 4:15 and brought in here. By EMS initial sugar was 29, later on improved to 119 after D50 and the patient's last blood sugar was like 63 at 6:30 and the patient also says that he took 10 beers today and he shaking because of withdrawal.  The patient denies any abdominal pain, nausea or vomiting and wants a [place] sandwich and also asked for pain medicine because he says he had rib fractures 2 weeks ago. The patient says that he took Lantus from his father, old medication which was in his fridge. He also has 2 more vials of Lantus left over and the patient does not have diabetes but he has Lantus from his dad. The patient was here last time a month ago approximately for the same problem and discharged.   PAST MEDICAL HISTORY:  Significant for repeated insulin overdose with suicidal ideation, depression and chronic pain syndrome. The patient has been seen by Dr. Toni [name] before.   PAST SURGICAL HISTORY: None.   ALLERGIES: None.   SOCIAL HISTORY: Smokes 1 pack per day and drinks about 6 beers per day and no drugs.   FAMILY HISTORY: Positive for diabetes and coronary artery disease per father.   REVIEW OF SYSTEMS: CONSTITUTIONAL: Feels tired and shaky.  EYES: No blurred vision.  ENT: No tinnitus. No ear pain. No epistaxis.  RESPIRATIONS: The patient has no cough, no wheezing.  Has painful respiration because of rib fractures on the right  side 2 weeks.  CARDIOVASCULAR: No chest pain. No orthopnea.  GASTROINTESTINAL: No nausea, no vomiting. No abdominal pain.  GENITOURINARY:  No dysuria.  ENDOCRINE: No polyuria or nocturia.  HEMATOLOGIC: No anemia.  MUSCULOSKELETAL: No joint pain.  NEUROLOGIC: The patient has no numbness, no weakness.  PSYCHIATRIC: The patient does have depression and also history of anxiety.   PHYSICAL EXAMINATION: VITAL SIGNS: Temperature 99.7, heart rate 95, blood pressure 157/98, sats 98% on room air.  GENERAL: He is alert, awake, oriented, well-developed, well-nourished male not in distress, answering questions appropriately.  HEENT: Head atraumatic, normocephalic. Pupils equally reacting to light. Extraocular movements are intact. ENT: The patient has no tympanic membrane congestion. No turbinate hypertrophy. No oropharyngeal erythema.  NECK: Normal range of motion. No JVD. No carotid bruit.  RESPIRATORY: Clear to auscultation. No wheeze, no rales.  CARDIOVASCULAR: S1, S2 regular. No murmurs. The patient's PMI is not displaced. Good pedal and carotid and femoral pulses. The patient has no peripheral edema. GASTROINTESTINAL: Abdomen is nontender, nondistended. Bowel sounds present. No organomegaly.  MUSCULOSKELETAL: Normal, the patient is able to move extremities x 4.  SKIN: Inspection is normal, well hydrated.  VASCULAR: Good pedal pulses.  NEUROLOGIC: Cranial nerves II through XII are intact. Power 5/5 in upper and lower extremities. Sensation is intact. DTRs 2+ bilaterally.  PSYCHIATRIC:  The patient is slightly anxious and depressed.   LABORATORY, DIAGNOSTIC AND RADIOLOGICAL DATA:  1.  Chest x-ray shows  no active disease.  2.  WBC 7.8, hemoglobin 15, hematocrit 45 and platelets 388.  3.  Electrolytes: Sodium 133, potassium 4, chloride 104, bicarb 21, BUN 4, creatinine 0.83, glucose 69. LFTs within normal limits.  Alcohol level 0.183. The patient's initial blood glucose level 28, repeat blood glucose  level at 6:30 is 63. He received D50 one amp along with right now he is on D5/half with KCl 20 mEq at 100 mL/h and he is made IVC by ER physician.  4.  Urine toxicology positive for MDMA.   ASSESSMENT AND PLAN:  1.  The patient is a 39 year old male with intentional overdose of insulin and Lyrica. The patient was admitted to Intensive Care Unit for hypoglycemia, started on D10. Continue to check Accu-Cheks q.2 hours.  Start on D10 at 100 mL/h. Continue to check sugars every 2 hours.  If the patient's blood sugars are more than 150 for at least 4 or 5 times, then we can change IV to D5. The patient is at high risk to get into coma with hypoglycemia and possibly seizures with hypoglycemia.  2.  Suicidal ideation, made involuntary commitment and one-to-one. Will get a psychiatric consult.  3.  Depression. Continue his home medication.  4.  Alcohol abuse. Right now having some shaking. Continue clinical institute withdrawal assessment of alcohol protocol.  5.  Hyponatremia. Will continue IV fluids. 6.  Tobacco abuse.  Counseled for 5 minutes.    TIME SPENT:  About 60 minutes on history and physical.     ____________________________ Katha Hamming, MD sk:cs D: 11/24/2013 19:18:33 ET T: 11/24/2013 19:59:36 ET JOB#: [id number]  cc: Katha Hamming, MD, <Dictator> Katha Hamming MD ELECTRONICALLY SIGNED 12/30/2013 13:36

## 2015-03-14 NOTE — Consult Note (Signed)
Brief Consult Note: Diagnosis: Borderline personality disorder.   Patient was seen by consultant.   Consult note dictated.   Recommend further assessment or treatment.   Orders entered.   Discussed with Attending MD.   Comments: Psychiatry: [name] is well known to us. He was placed in [facility] group home by court order and has the guardian now. He was brought to the ER after OD on unknown medications and insulin. Reportedly the patient left his group home and returned to his trailer where he had "old" medications put away. This is in the context of recent major loss and relapse on alcohol.   PLAN: 1. The patient is on IVC.     2. We will restart his medications as in the community. We will check VPA .     3. This patient is too sedated for an interview.   4. He does not benefit from psychiatric admissions.  5. I will follow up with him tomorrow with a plan to discharge him back to [facility] and follow up with [facility] ACT team and Dr. Omelia BlackwaterHeaden.  Electronic Signatures: Kristine LineaPucilowska, Clarkson Rosselli (MD)  (Signed 25-Jul-15 11:27)  Authored: Brief Consult Note   Last Updated: 25-Jul-15 11:27 by Kristine LineaPucilowska, Andreyah Natividad (MD)

## 2015-03-14 NOTE — Consult Note (Signed)
Brief Consult Note: Diagnosis: personality disorder/ alcohol dependence.   Patient was seen by consultant.   Consult note dictated.   Comments: PSychiatry: Patient seen. Chart reviewed. Note dictated. Patient with history of repetative behavior to injure self, usually while intoxicated. Currently denies any suicidal ideation or intent. PAtient has been given services including long term hospital care, substance abuse treatment and ACT team all without stopping his behavior. He understands the risks he runs including deathy. No likely benifit from inpatient treatment now. I am in discussion with social work about possibility and utility of outpt commitment.  Electronic Signatures: Audery Amellapacs, Shaunessy Dobratz T (MD)  (Signed 07-Jan-15 11:20)  Authored: Brief Consult Note   Last Updated: 07-Jan-15 11:20 by Audery Amellapacs, Rachal Dvorsky T (MD)

## 2015-03-14 NOTE — Consult Note (Signed)
Brief Consult Note: Comments: Psychiatry: Update - Advise starting naltrexone 50mg  po for alcohol use and contiuing it after discharge.  Electronic Signatures: Audery Amellapacs, Alistar Mcenery T (MD)  (Signed 07-Jan-15 11:38)  Authored: Brief Consult Note   Last Updated: 07-Jan-15 11:38 by Audery Amellapacs, Landon Bassford T (MD)

## 2015-03-14 NOTE — H&P (Signed)
PATIENT NAME:  [name], [name] MR#:  [id number] DATE OF BIRTH:  December 25, 1975  PRIMARY CARE PROVIDER: Dr. Zada Finders.  EMERGENCY ROOM REFERRING PHYSICIAN: Minna Antis, MD  CHIEF COMPLAINT: Overdose on insulin, decreased responsiveness, hypoglycemia.   HISTORY OF PRESENT ILLNESS: The patient is a 39 year old white male who has had numerous admissions to the hospital for similar presentation of Lantus overdose with suicidal ideation. The patient was actually just discharged yesterday because he kept insisting that he wanted to go home.   The patient also was seen by psychiatry and they stated that there was no further inpatient treatment needed for this patient. He has exhausted all the resources without any improvement in his mental condition.   He comes back in with taking apparently 9 vials of insulin. Currently he is very sleepy and lethargic and is not able to give me any further history. I am not certain where he is getting all this insulin from. He previously stated that he had taken 1300 units of leftover insulin, which is very hard to believe that somebody would have that much insulin from before.   PAST MEDICAL HISTORY: Significant for repeat insulin overdose with suicidal ideation, depression, chronic pain syndrome, substance abuse in the past. Likely has significant personality disorder.   PAST SURGICAL HISTORY: None.   ALLERGIES: None.   SOCIAL HISTORY: The patient has a history of smoking 1 pack per day, drinks about 6 beers a day. Since he has been in the hospital for the past 4 days, he was not drinking. Apparently he went home and started drinking again.   FAMILY HISTORY: Positive for diabetes, coronary artery disease.   REVIEW OF SYSTEMS: Unable to obtain due to patient being currently lethargic.   PHYSICAL EXAMINATION:  VITAL SIGNS: Temperature 96.1, pulse 94, respirations 18, blood pressure 132/68.  GENERAL: An obese male, appears very disheveled and lethargic. He is not  answering any of my questions.  HEENT: Head atraumatic, normocephalic. Pupils equally round, reactive to light and accommodation. There is no conjunctival pallor. No scleral icterus. Nasal exam shows no drainage or ulceration.  OROPHARYNX: Clear without any exudate.  NECK: Supple without any JVD.  CARDIOVASCULAR: Regular rate and rhythm. No murmurs, rubs, clicks, or gallops. PMI is not displaced.  LUNGS: Clear to auscultation bilaterally without any rales, rhonchi, or wheezing.  ABDOMEN: Soft, nontender, nondistended. Positive sounds x4.  EXTREMITIES: No clubbing, cyanosis, or edema.  SKIN: No rash.  LYMPHATICS: No lymph nodes palpable.  VASCULAR: Good DP, PT pulses.  PSYCHIATRIC: Currently very lethargic. Not able to ask him any questions.  NEUROLOGICAL: Spontaneously moving his extremities earlier. Currently lethargic, unable to do a thorough neuro exam. Cranial nerves II through XII grossly appear intact.   DIAGNOSTIC EVALUATIONS: Glucose 32, BUN 4, creatinine 0.64, sodium 138, potassium 4.1, chloride 106, CO2 24. Calcium was 8.7. Alcohol level 0.125. LFTs: AST 40. TSH 0.52.   TUDS positive for MDMA. WBC 12.1, hemoglobin 13.8, platelet count 213. Urinalysis was negative. Acetaminophen less than 2. Salicylate level less than 1.7.   ASSESSMENT AND PLAN: The patient is a 39 year old who is well known to our service from recurrent admissions on overdose on insulin. Was discharged yesterday, now being readmitted with hypoglycemia, suicidal ideation.  1.  Recurrent overdose on insulin. At this time, will place him on D10-containing fluid. Will do Accu-Cheks q.1 hours. Monitor his blood sugars. He will need intensive care unit monitoring in light of his significant altered mental status.  2.  Recurrent suicidal ideation. Psychiatric  evaluation. The patient needs a better disposition; otherwise, this will continue to happened and drain the system of unnecessary costs and other health-related care.  3.   Depression, personality disorder. Hold medications for the time being.  4.  History of alcohol abuse. Was in the hospital for 4 days. Discharged yesterday. At this point, he does not need to be placed on CIWA protocol during hospitalization. The patient has drug-seeking behavior including asking for Ativan and pain medications. I would hold those during this hospitalization unless absolutely needed.  5.  Chronic pain. Use Tylenol. No narcotics.  6.  Hypotension. We will hold blood pressure medications for now.  7.  MISCELLANEOUS: We will use Lovenox for deep vein thrombosis prophylaxis.   CRITICAL CARE TIME SPENT: 50 minutes.   ____________________________ Lacie ScottsShreyang H. Allena KatzPatel, MD shp:np D: 11/28/2013 17:02:37 ET T: 11/28/2013 17:41:44 ET JOB#: [id number]  cc: Ceairra Mccarver H. Allena KatzPatel, MD, <Dictator> Charise CarwinSHREYANG H Cordell Coke MD ELECTRONICALLY SIGNED 12/13/2013 8:13

## 2015-03-14 NOTE — Consult Note (Signed)
Brief Consult Note: Diagnosis: Borderline personality disorder.   Patient was seen by consultant.   Consult note dictated.   Recommend further assessment or treatment.   Orders entered.   Discussed with Attending MD.   Comments: Psychiatry: [name] is well known to us. He was placed in [facility] group home by court order and has the guardian now. He was brought to the ER after OD on unknown medications and insulin. Reportedly the patient left his group home and returned to his trailer where he had "old" medications put away. This is in the context of recent major loss and relapse on alcohol.   The patient is no longer suicidal or homicidal. He is alert and fully oriented ready to return to his group home.   PLAN: 1. The patient no longre meets criteria for IVC. I will terminate proceedings. Please discharge as appropriate.     2. We restarted his medications as in the community. No Rx necessary.      3. [facility] and follow up with [facility] team and Dr. Omelia BlackwaterHeaden.  Electronic Signatures: Kristine LineaPucilowska, Jolanta (MD)  (Signed 27-Jul-15 14:54)  Authored: Brief Consult Note   Last Updated: 27-Jul-15 14:54 by Kristine LineaPucilowska, Jolanta (MD)

## 2015-03-14 NOTE — Consult Note (Signed)
Brief Consult Note: Diagnosis: Borderline personality disorder and alcohol abuse.   Patient was seen by consultant.   Consult note dictated.   Discussed with Attending MD.   Comments: Psychiatry: PAtient presents yet again having shot himself up with insulin and then called EMS on himself. Says he had no suicidal intent and just "wanted attention". Also drinking. Currently calm and appears lucid. There is now a plan coordinated by community resourses to have [facility]" [facility] his blood sugar has been stable for 24 hours off of IV fluids or meds. In the mean time I suggest keeping the sitter to guard against acting out by the patient , who is not pleased by the plan.  Electronic Signatures: Audery Amellapacs, Abriel Hattery T (MD)  (Signed 09-Jan-15 15:42)  Authored: Brief Consult Note   Last Updated: 09-Jan-15 15:42 by Audery Amellapacs, Laela Deviney T (MD)

## 2015-03-28 IMAGING — CR DG CHEST 1V PORT
1 series · 1 of 1 positions shown · non-contrast
Comparison: none

REASON FOR EXAM: line placement
COMMENTS:

PROCEDURE:     DXR - DXR PORTABLE CHEST SINGLE VIEW  - February 20, 2013  [DATE]
RESULT:     Comparison: None

[ap]
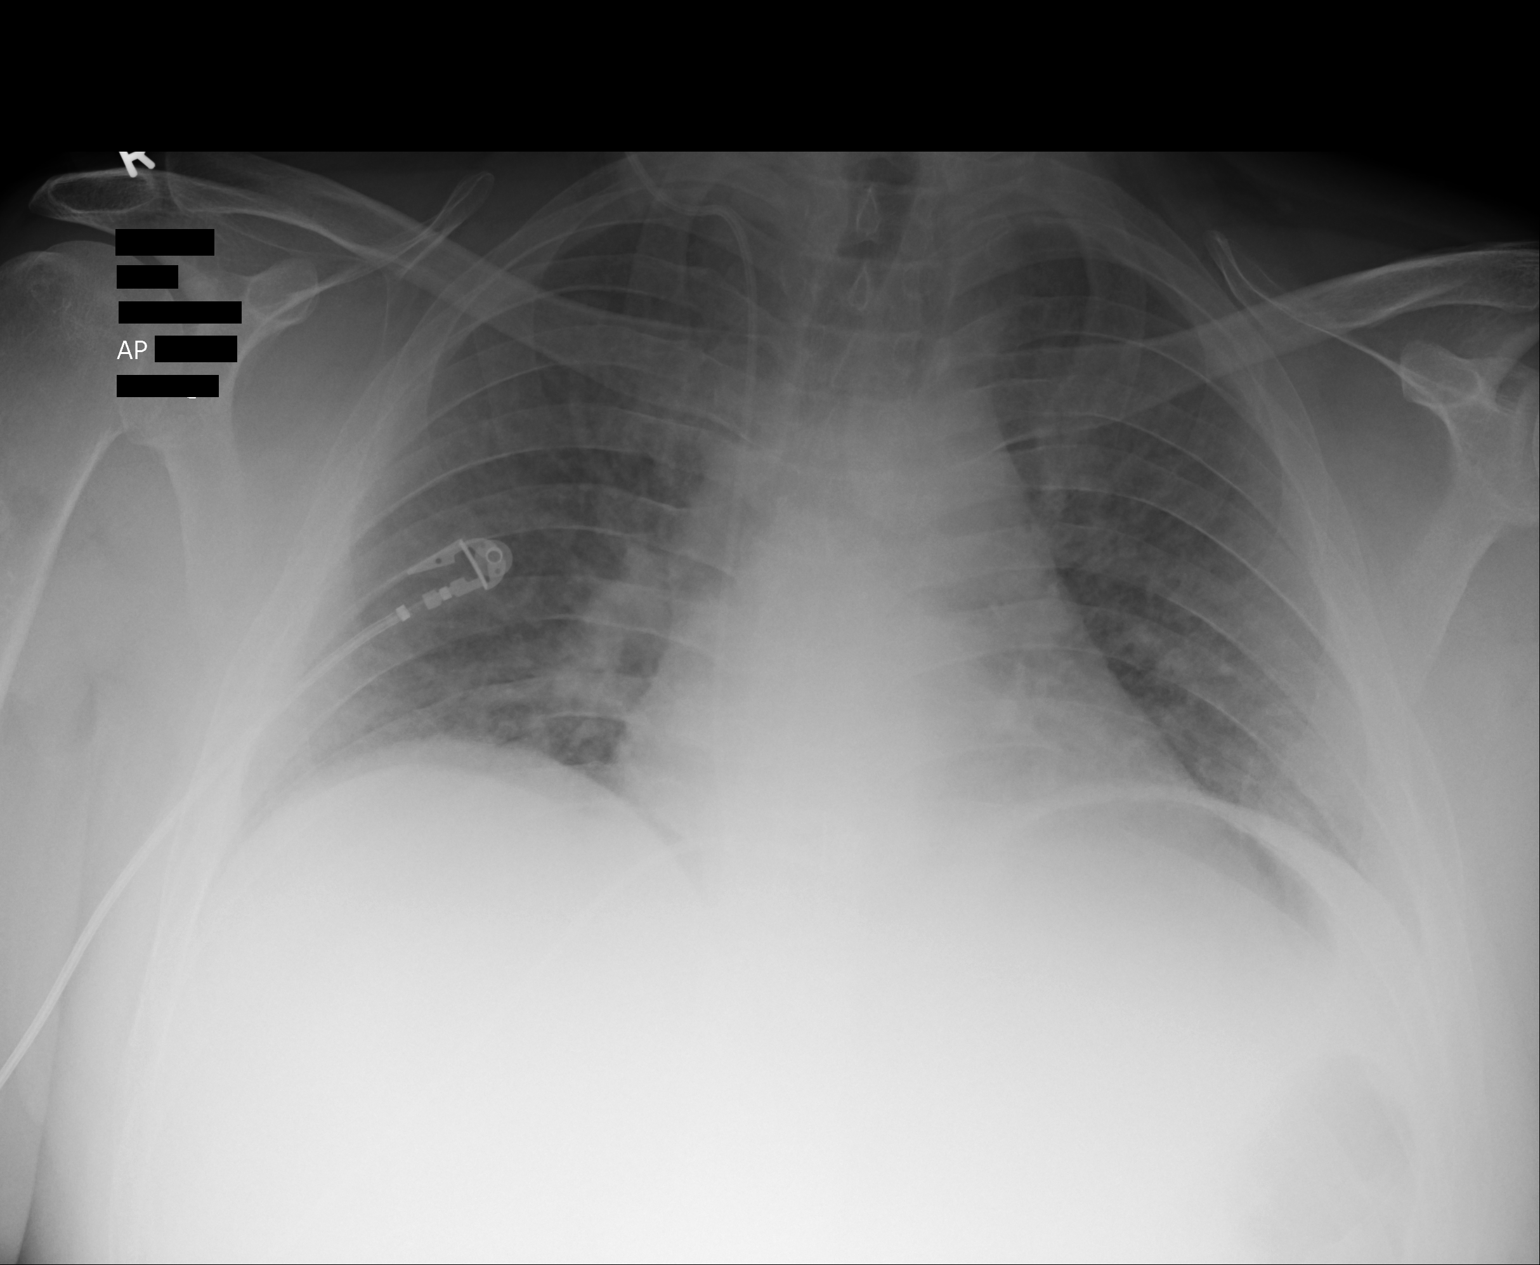

[1 of 1 positions shown; findings below may reference images not displayed]

FINDINGS: Single portable AP chest radiograph is provided. There is a right-sided
central venous catheter with the tip projecting over the right atrium.
Recommend withdrawing  the central venous catheter by approximately 4 cm
prior to use. There is bilateral interstitial prominence likely secondary to
low lung volumes. There is no focal parenchymal opacity, pleural effusion,
or pneumothorax. Normal cardiomediastinal silhouette. The osseous structures
are unremarkable.
IMPRESSION: There is a right-sided central venous catheter with the tip projecting over
the right atrium. Recommend withdrawing  the central venous catheter by
approximately 4 cm prior to use.

[REDACTED]

## 2015-03-30 IMAGING — CR DG CHEST 2V
1 series · 4 of 4 positions shown · non-contrast
Comparison: none

REASON FOR EXAM: for placement at [REDACTED]
COMMENTS:

[Series 4: x chest ap · 0.14mm/px · 4 of 4 slices shown]
[im 1/4]
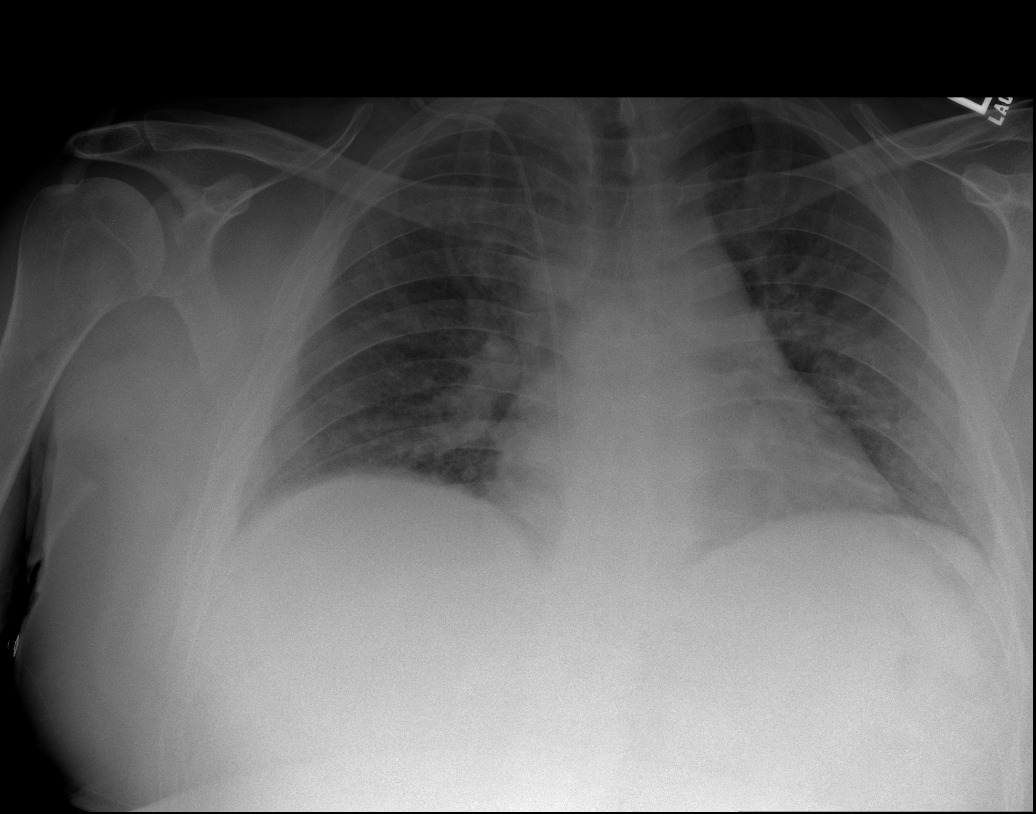
[im 2/4]
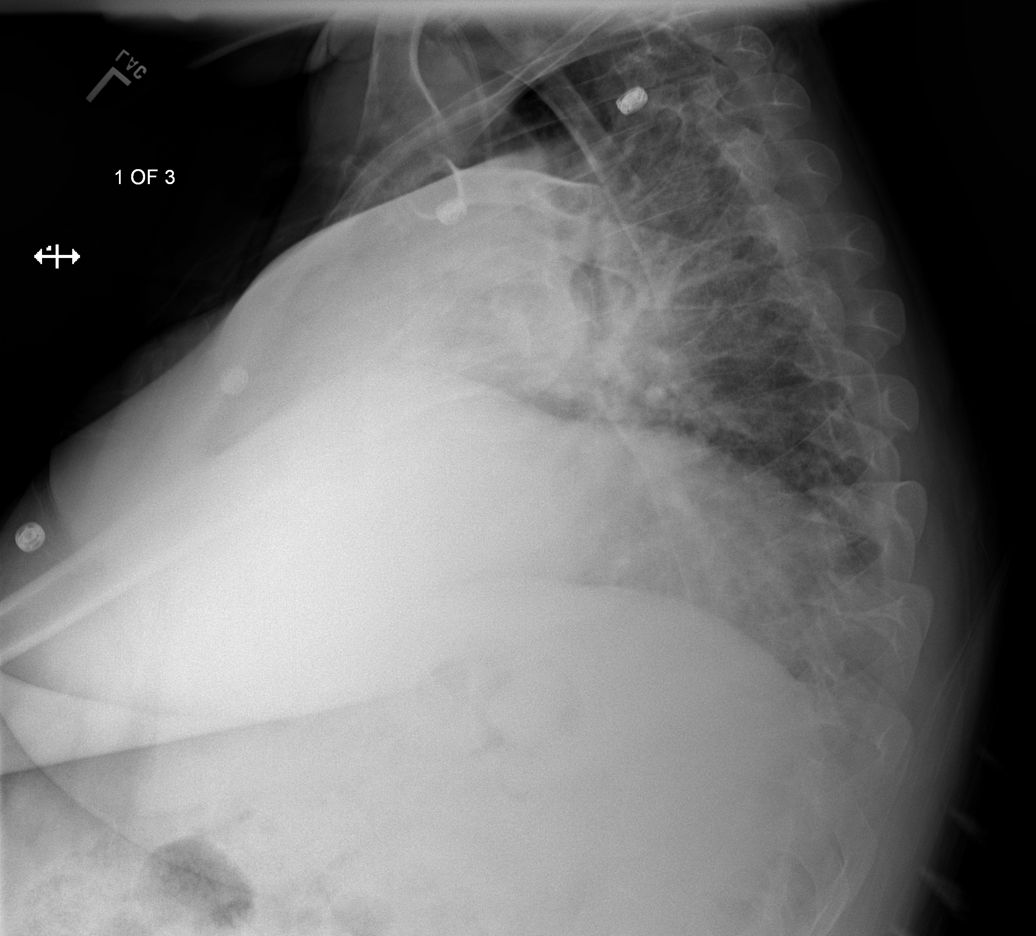
[im 3/4]
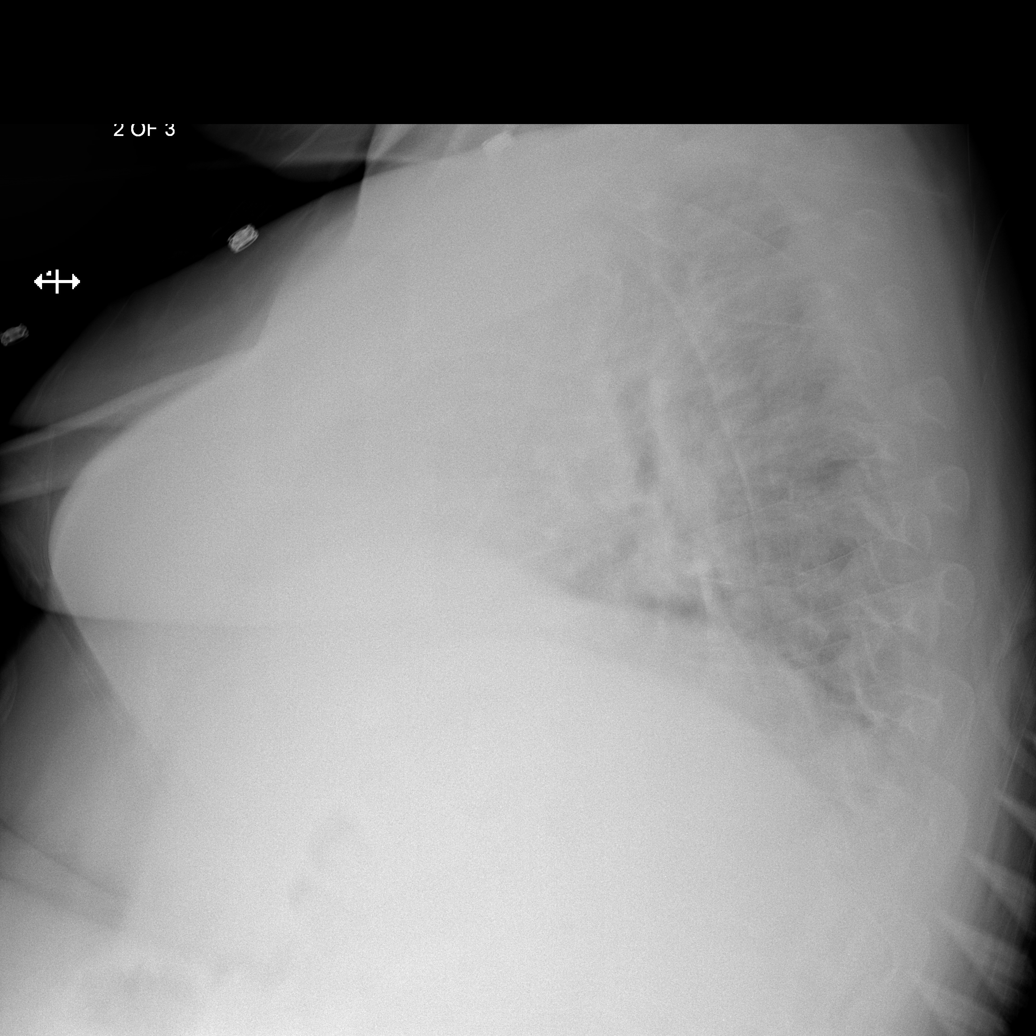
[im 4/4]
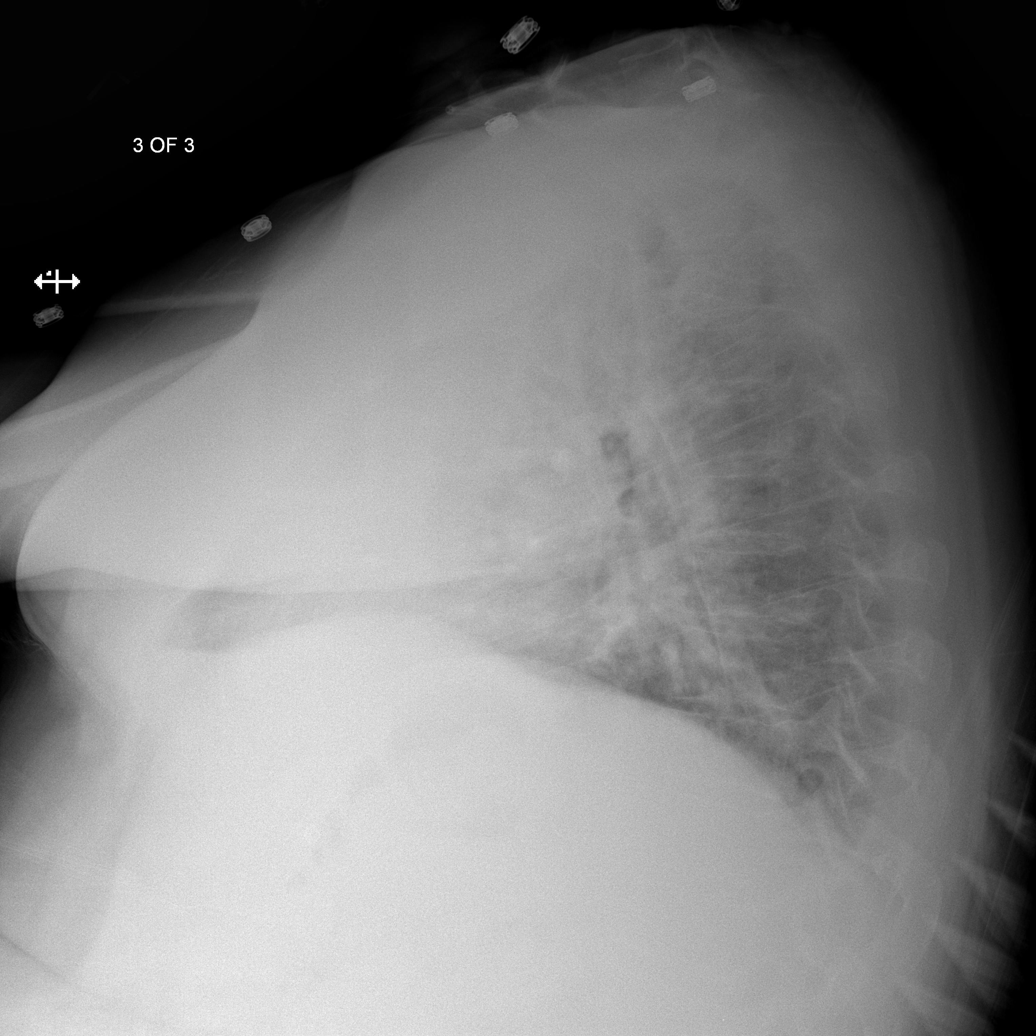

[4 of 4 positions shown; findings below may reference images not displayed]

PROCEDURE:     DXR - DXR CHEST PA (OR AP) AND LATERAL  - February 22, 2013  [DATE]

RESULT:     Comparison is made to previous study of to February 2013. There is
a right-sided central venous catheter present. The tip is in the right
atrium inferiorly. There is shallow inspiratory effort with mild
interstitial prominence. There is no significant effusion. A very shallow
inspiratory effort is present.
IMPRESSION: Hypoinflation with interstitial prominence. Portion this
could be artifactual but underlying edema or nonedematous infiltrate are
differential considerations.

[REDACTED]

## 2015-03-31 NOTE — H&P (Signed)
PATIENT NAMLorriane [name]:  [name], [name] [id number] OF BIRTH:  1976-06-27 OF ADMISSION:  12/07/2012 PHYSICIAN:  Dr. Michiel Sitesima VaickutePHYSICIAN:  Kristine LineaJolanta Adonnis Salceda, M.D.  DATA:  [name] is a 39 year old male with history of depression, alcoholism and multiple suicide attempts.  COMPLAINT:  The patient unable to state.  OF PRESENT ILLNESS:  The patient was admitted to the medical floor after another overdose on 1300 units of insulin. He was just discharged from [facility] psychiatry after  a similar attempt on 11/27/2012. Previously he denied suicidal intent but lately admits freely that he has been suicidal since his girlfriend left him over the summer. It is still unclear how he gets such massive amounts of insulin. It has not been prescribed in the hospital. He tells me that this is his daddy's, who is in SNF for rehab, insulin. I know that one with established history of diabetes can get more of [facility] without current prescription. It is also interesting that he can get to the hospital in time to be saved after such serious overdoses. In addition, the patient injected himself with insulin that he hid in his pants while on medical floor. He is unable to explain his actions. The patient endorses many symptoms of depression with poor sleep, decreased appetite, anhedonia, feeling of guilt, hopelessness, worthlessness, social isolation, heightened anxiety, poor memory and concentration and lack of energy. During previous hospitalization the patient behaved poorly. He was upset and told me that his father just passed away. He was crying in despair, hitting walls, demanding Ativan. The patient admits to escalating drinking, over 12-pack a day. There is a history of alcoholism, and the patient was referred to [facility] in the summer of last year. He completed treatment at [facility] and was able to maintain sobriety for 3 or 4 weeks. He reports heightened anxiety with panic attacks. He denies psychotic symptoms. There are no symptoms  suggestive of bipolar mania. He denies illicit drugs or prescription pill abuse.  PSYCHIATRIC HISTORY: He has been hospitalized multiple times. His [facility] as a child [name]. He was kept in the hospital for almost 2 years. This was following his parents' divorce. He has been hospitalized here several times on medical floor for "accidental" insulin overdose, often brought to psychiatry following an overdose. He has been tried on numerous medications including Prozac, Tegretol, lithium, Valium and Zoloft. He has not been taking any medications recently.  PSYCHIATRIC HISTORY:  None reported.  MEDICAL HISTORY:   Hypertension.  Diabetes. GERD. Asthma. Alcoholic hepatitis. Pseudoseizures.  ASPIRIN, CODEINE, MOTRIN, PENICILLIN, TRAMADOL.  ON ADMISSION:  Thiamine 100 mg daily. Norvasc 10 mg daily. Nexium 40 mg daily. Folic acid 1 mg daily. Enalapril 20 mg daily. Clonidine 0.1 mg every 3 hours as needed for hypertension. insulin was prescribed at the time of last or previous visit. It is unclear how he obtains it.  HISTORY:   He lives with his father in a trailer, if he can be trusted. He has Medicare and Medicaid.  He has a history of drinking and treatment noncompliance.    OF SYSTEMS:  CONSTITUTIONAL:  No fevers or chills. No weight changes. EYES:  No double or blurred vision. ENT:  No hearing loss. RESPIRATORY:  No shortness of breath or cough. CARDIOVASCULAR:  No chest pain or orthopnea. GASTROINTESTINAL:  No abdominal pain, nausea, vomiting or diarrhea. GENITOURINARY:  No incontinence or frequency. ENDOCRINE:  No heat or cold intolerance. LYMPHATIC:  No anemia or easy bruising. INTEGUMENTARY:  No acne  or rash. MUSCULOSKELETAL:  Positive for bruises on both hands from hitting the wall. NEUROLOGIC:  No tingling or weakness. PSYCHIATRIC:  See history of present illness for details.  EXAMINATION: VITAL SIGNS:  Blood pressure 114/75, pulse 86, respirations 18, temperature 98.8.  GENERAL:  This is a slightly obese, unkept male, in no acute distress. HEENT:  The pupils are equal, round and reactive to light. Sclerae anicteric. NECK:  Supple. No thyromegaly. LUNGS:  Clear to auscultation. No dullness to percussion. HEART:  Regular rhythm and rate. No murmurs, rubs or gallops. ABDOMEN:  Soft, nontender, nondistended. Positive bowel sounds. MUSCULOSKELETAL:  Normal muscle strength in all extremities. SKIN:  No rashes. Positive for scratches and bruises on both hands. LYMPHATIC:  No cervical adenopathy. NEUROLOGIC:  Cranial nerves II through XII are intact.  AND LABORATORY DATA:  Chemistries are within normal limits except for K 3.2. LFTs within normal limits except for AST of 43. Blood alcohol level on admission 0.077.  Urine tox screen negative for substances. CBC within normal limits. Urinalysis is not suggestive of urinary tract infection.  EKG: Normal sinus rhythm, prolonged QT, abnormal EKG.  STATUS EXAMINATION ON ADMISSION: The patient is alert and oriented to person, place, time and somewhat to situation. He is irritable, unpleasant and badly cooperative. There is some psychomotor agitation. He is unkept. There is minimal eye contact. His speech is soft. Mood is depressed with labile affect. Thought processing is slow. Thought content: He denies suicidal or homicidal ideation, but was admitted to the hospital after a serious overdose on insulin. There are no delusions or paranoia. There are no auditory or visual hallucinations. His cognition is difficult to assess, as the patient is unwilling to participate. His insight and judgment are extremely poor.  RISK ASSESSMENT ON ADMISSION:  This is a patient with a long history of mood instability and multiple suicide attempts by insulin overdose. He admits to being severely depressed and suicidal.  DIAGNOSES: I:  Mood disorder, not otherwise specified.   Alcohol dependence.   Cocaine abuse.   Benzodiazepine dependence. II:   Personality  disorder, not otherwise specified. III:  Hypertension, diabetes, gastroesophageal reflux disease, alcoholic hepatitis, pseudoseizures. IV:   Mental illness, substance abuse, primary support, family conflict. V:    Global Assessment of Functioning on admission 25.   PLAN:  The patient was admitted to [facility] for safety, stabilization, substance abuse counseling, and medication adjustment. He was initially placed on suicide precautions and was closely monitored for any unsafe behaviors. He underwent full psychiatric and risk assessment. At no time during current hospitalization was the patient suicidal, homicidal or violent. He received pharmacotherapy, individual and group psychotherapy, substance abuse counseling, and support from therapeutic milieu.  Suicidal ideation: The patient denies.   Mood: The patient was started on Tegretol on the medical floor. Will add Prozac for depression.    Medical problems:  We will continue all medications as prescribed by on medical floor.   Substance abuse: The patient has a history of alcohol and benzodiazepine dependence. He was detoxed during his previous hospitalization and does not require alcohol detox. He was given high doses of Ativan on medical floor and may need taper.   Insulin overdose: The patient has multiple suicide attempts by insulin overdose, including one in the hospital. He was searched thoroughly on admission to BMU. We will monitor blood glucose level. He does not require glucose lowering drugs.   Substance abuse treatment: in addition to alcohol and  benzodiazepine, the patient uses cannabis and cocaine. We will refer to [facility] for rehab again.   DISPOSITION: TBE.     Electronic Signatures: Kristine LineaPucilowska, Fernie Grimm (MD)  (Signed on 17-Jan-14 20:07)  Authored  Last Updated: 17-Jan-14 20:07 by Kristine LineaPucilowska, Rashad Auld (MD)

## 2015-03-31 NOTE — H&P (Signed)
PATIENT NAMLorriane [name]:  [name], Shane [name] [id number] OF BIRTH:  Feb 24, 1976 OF ADMISSION:  01/15/2013  REFERRING PHYSICIAN:  Vipul S. Sherryll BurgerShah, MD ATTENDING PHYSICIAN:  Kristine LineaJolanta Gretna Bergin, M.[name].  DATA:  The patient is a 39 year old male with a history of substance abuse and multiple suicide attempts by insulin overdose.  COMPLAINT: "I feel good."  OF PRESENT ILLNESS: The patient was hospitalized at [facility] earlier this month after another insulin overdose. He was transferred to [facility] rehab facility for substance abuse treatment. He completed treatment there but started drinking right after discharge. He was brought to [facility] with hypoglycemia after insulin and Tegretol overdose on Februray 21 and was admitted to CCU. He was transferred to psychiatry on 02/25. He reportedly used Lantus belonging to his father. The patient has never been compliant with treatment, never follows up with his appointments. He takes no medications. He tells me that there is no transportation. Zthis is because police impounded his father?s car as the patient has been using it repeatedly when drunk. In addition to multiple suicide attempts and presumably depression, the patient has a long history of substance abuse including alcohol and prescription pills. He usually abuses benzodiazepines. The patient denied any thoughts of hurting himself or others, psychotic symptoms or substance use, even though he was drunk at the time of admission to CCU.  PSYCHIATRIC HISTORY: There are multiple hospitalizations for suicide attempts. His first extended [facility] as a child [facility] in [facility] following his parents? divorce. He was hospitalized here after what he called accidental insulin overdose and now increasingly admits that it was a suicide attempt. He has been tried on numerous medications including Prozac, Tegretol, lithium, Valium and Zoloft, but has never been compliant.  PSYCHIATRIC HISTORY: None  reported.   MEDICAL HISTORY: Hypertension, diabetes, GERD, asthma, alcoholic hepatitis, pseudoseizures.   ALLERGIES: ASPIRIN, CODEINE, MOTRIN, PENICILLIN, TRAMADOL.  AT THE TIMA OF TRANSFER:  Prilosec 20 mg daily.   HISTORY: He apparently lives alone now. His father lives in a trailer next door. He is very isolated. His ex-girlfriend spends some time with him. It is unclear where he gets his insulin. Of note, he attempted to inject insulin while in the hospital during previous admission. The patient is disabled. He has Medicaid.  OF SYSTEMS: CONSTITUTIONAL: No fevers or chills. No weight changes. No double or blurred vision. EARS, NOSE, THROAT: No hearing loss. RESPIRATORY: No shortness of breath or cough. CARDIOVASCULAR: No chest pain or orthopnea. GASTROINTESTINAL: No abdominal pain, nausea, vomiting or diarrhea. No incontinence or frequency. ENDOCRINE: Positive for overdose on insulin. LYMPHATIC: No anemia or easy bruising. INTEGUMENTARY: No acne or rash. MUSCULOSKELETAL: No muscle or joint pain. NEUROLOGIC: No tingling or weakness. PSYCHIATRIC: See history of present illness for details.  EXAMINATION: VITAL SIGNS: Blood pressure 144/85, pulse 75, respirations 18, temperature 98.3. GENERAL: This is a slightly obese male in no acute distress. HEENT: The pupils are equal, round and reactive to light. Sclerae anicteric. NECK: Supple. No thyromegaly. LUNGS: Clear to auscultation. No dullness to percussion. HEART: Regular rhythm and rate. No murmurs, rubs or gallops. ABDOMEN: Soft, nontender, nondistended. Positive bowel sounds. MUSCULOSKELETAL: Normal muscle strength in all extremities. SKIN: No rashes or bruises. LYMPHATIC: No cervical adenopathy. NEUROLOGIC: Cranial nerves II through XII are intact.  DATA: Chemistries are within normal limits. Blood sugar on admission 60. Blood alcohol level is 0.101. LFTs within normal limits. Urine tox screen negative for substances. CBC within normal limits. Urinalysis  is not suggestive of urinary tract  infection. Serum acetaminophen less than 2. Serum salicylates less than 1.7.  STATUS EXAMINATION ON ADMISSION: The patient is alert and oriented to person, place, time and situation. He is pleasant, polite and cooperative, unusually cheerful. He recognizes me from previous admission. He is well groomed, better than ever. There is good eye contact. His speech is soft. Mood is "fine" with labile affect. Thought processing is logical with its own logic. Thought content: He denies suicidal or homicidal ideation, but was admitted after a suicide attempt by insulin overdose. There are no delusions or paranoia. There are no auditory or visual hallucinations. His cognition is grossly intact. His insight and judgment are extremely poor. RISK ASSESSMENT ON ADMISSION: This is a patient with a long history of mental illness, who has been overdosing on insulin regularly, coming to the hospital for that, never able to explain what his problem is and never wanting to continue in psychiatric treatment, in spite of outpatient substance abuse involuntary commitment.  DIAGNOSES:I:  Alcohol dependence.    Mood disorder, not otherwise specified. II:  Deferred. III:  Obesity, hypertension, diabetes. IV:  Mental illness, primary support, relationship, extremely poor coping skills. V:  Global Assessment of Functioning on admission 20.    The patient was admitted to [facility] for safety, stabilization and medication management. He was initially placed on suicide precautions and was closely monitored for any unsafe behaviors. He underwent full psychiatric and risk assessment. He received pharmacotherapy, individual and group psychotherapy, substance abuse counseling and support from therapeutic milieu.   Suicidal ideation. The patient denies suicidal ideation.   Mood. We continue Prozac for depression and Seroquel for sleep.    Alcohol dependence. The  patient has a history of drinking. BAL on admission was 0.101. ZHe just completed alcohol rehab.   Medical. We will continue prilosec as prescribed by the primary doctor at the time of transfer from the medical floor. No antidiabetes drugs are indicated.    Disposition.  The patient will be discharged to home.    Electronic Signatures: Kristine LineaPucilowska, Lynee Rosenbach (MD)  (Signed on 28-Feb-14 01:17)  Authored  Last Updated: 28-Feb-14 01:17 by Kristine LineaPucilowska, Davonte Siebenaler (MD)

## 2015-04-12 IMAGING — CR DG HAND COMPLETE 3+V*L*
1 series · 3 of 3 positions shown · non-contrast
Comparison: none

REASON FOR EXAM: post fall
COMMENTS:   Bedside (portable):Y

PROCEDURE:     DXR - DXR HAND LT COMPLETE  W/OBLIQUES  - March 07, 2013 [DATE]
RESULT:     History: Fall.
Comparison Study: No prior.

[Series 1: pa · 0.17mm/px · 3 of 3 slices shown]
[im 1/3]
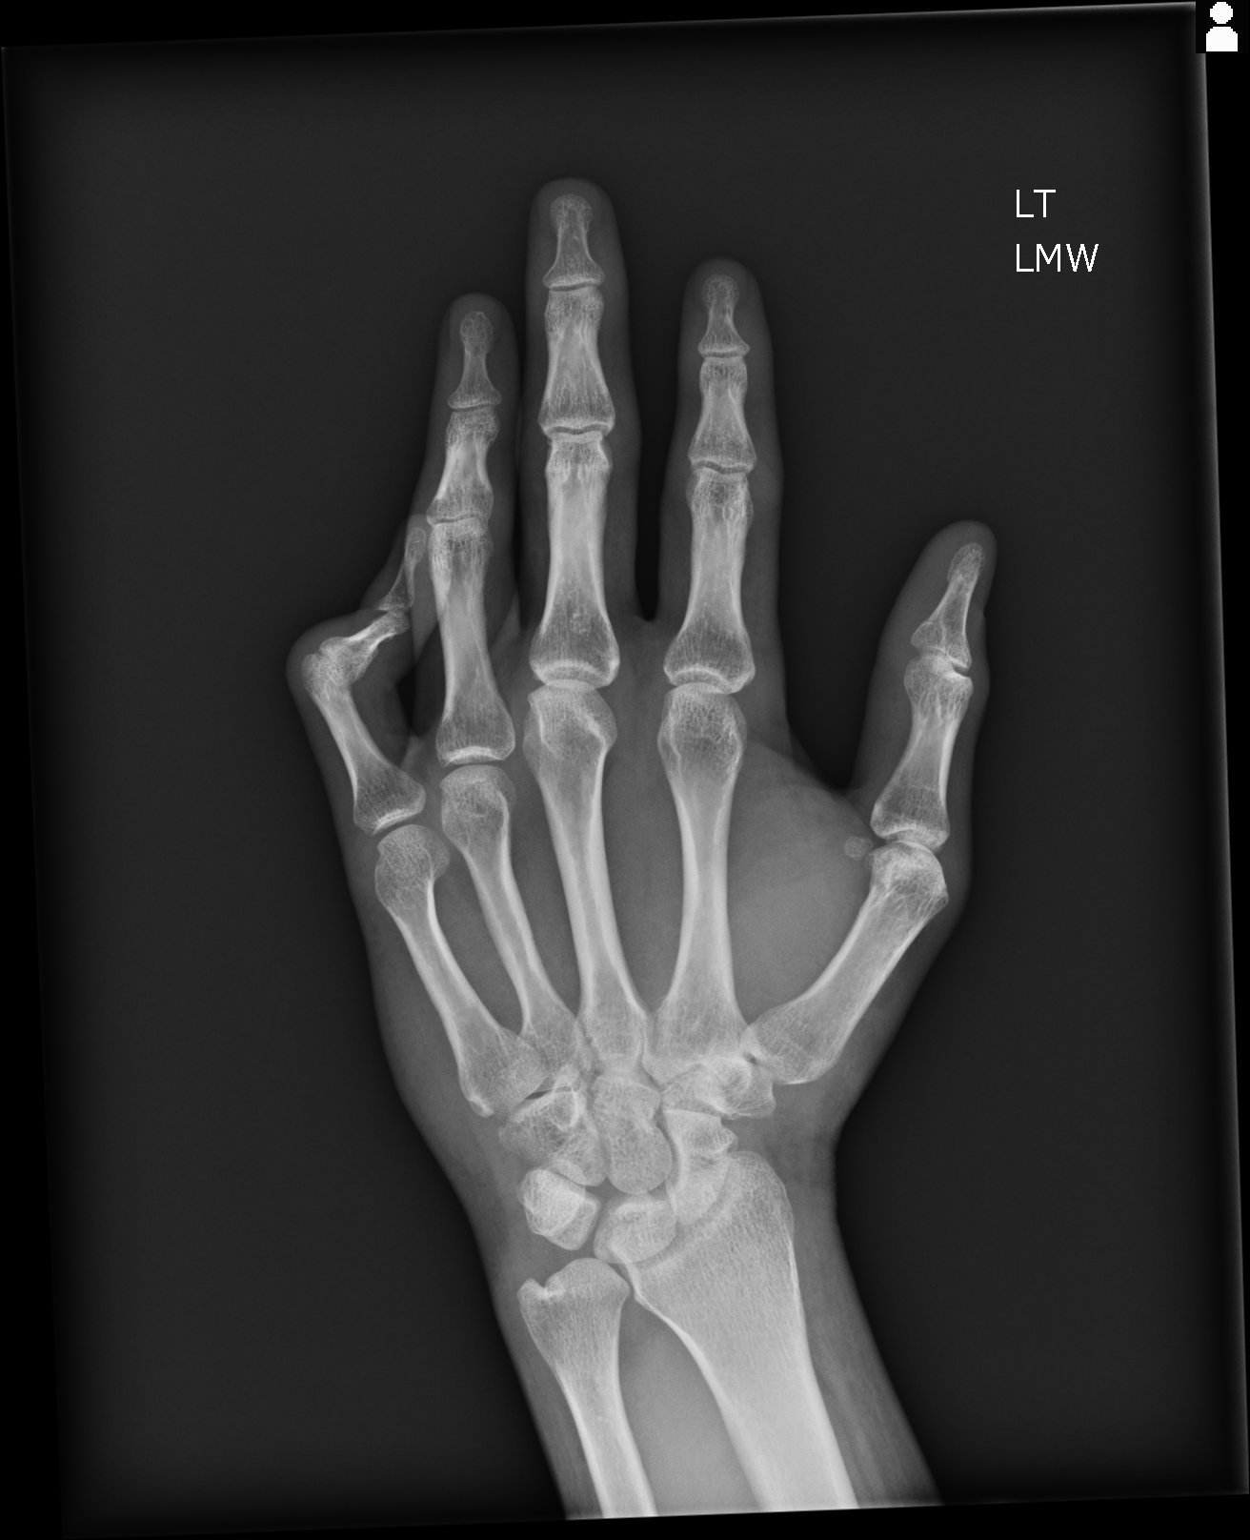
[im 2/3]
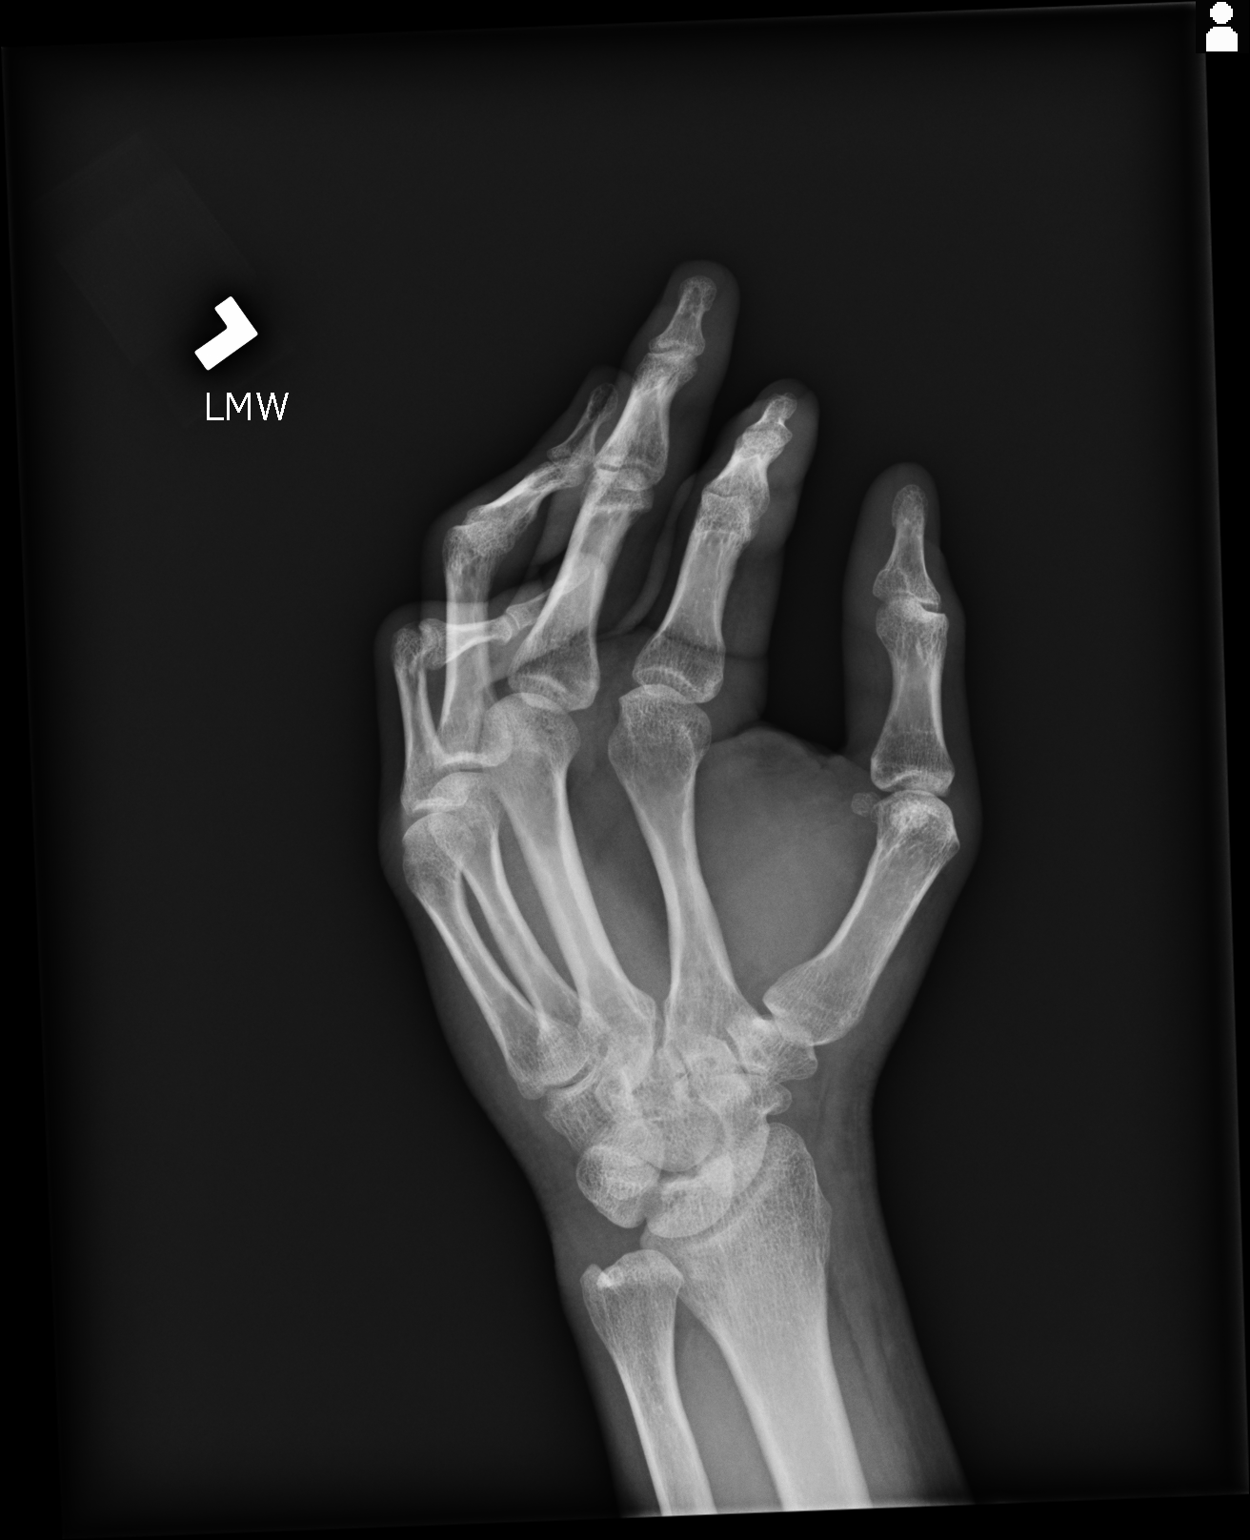
[im 3/3]
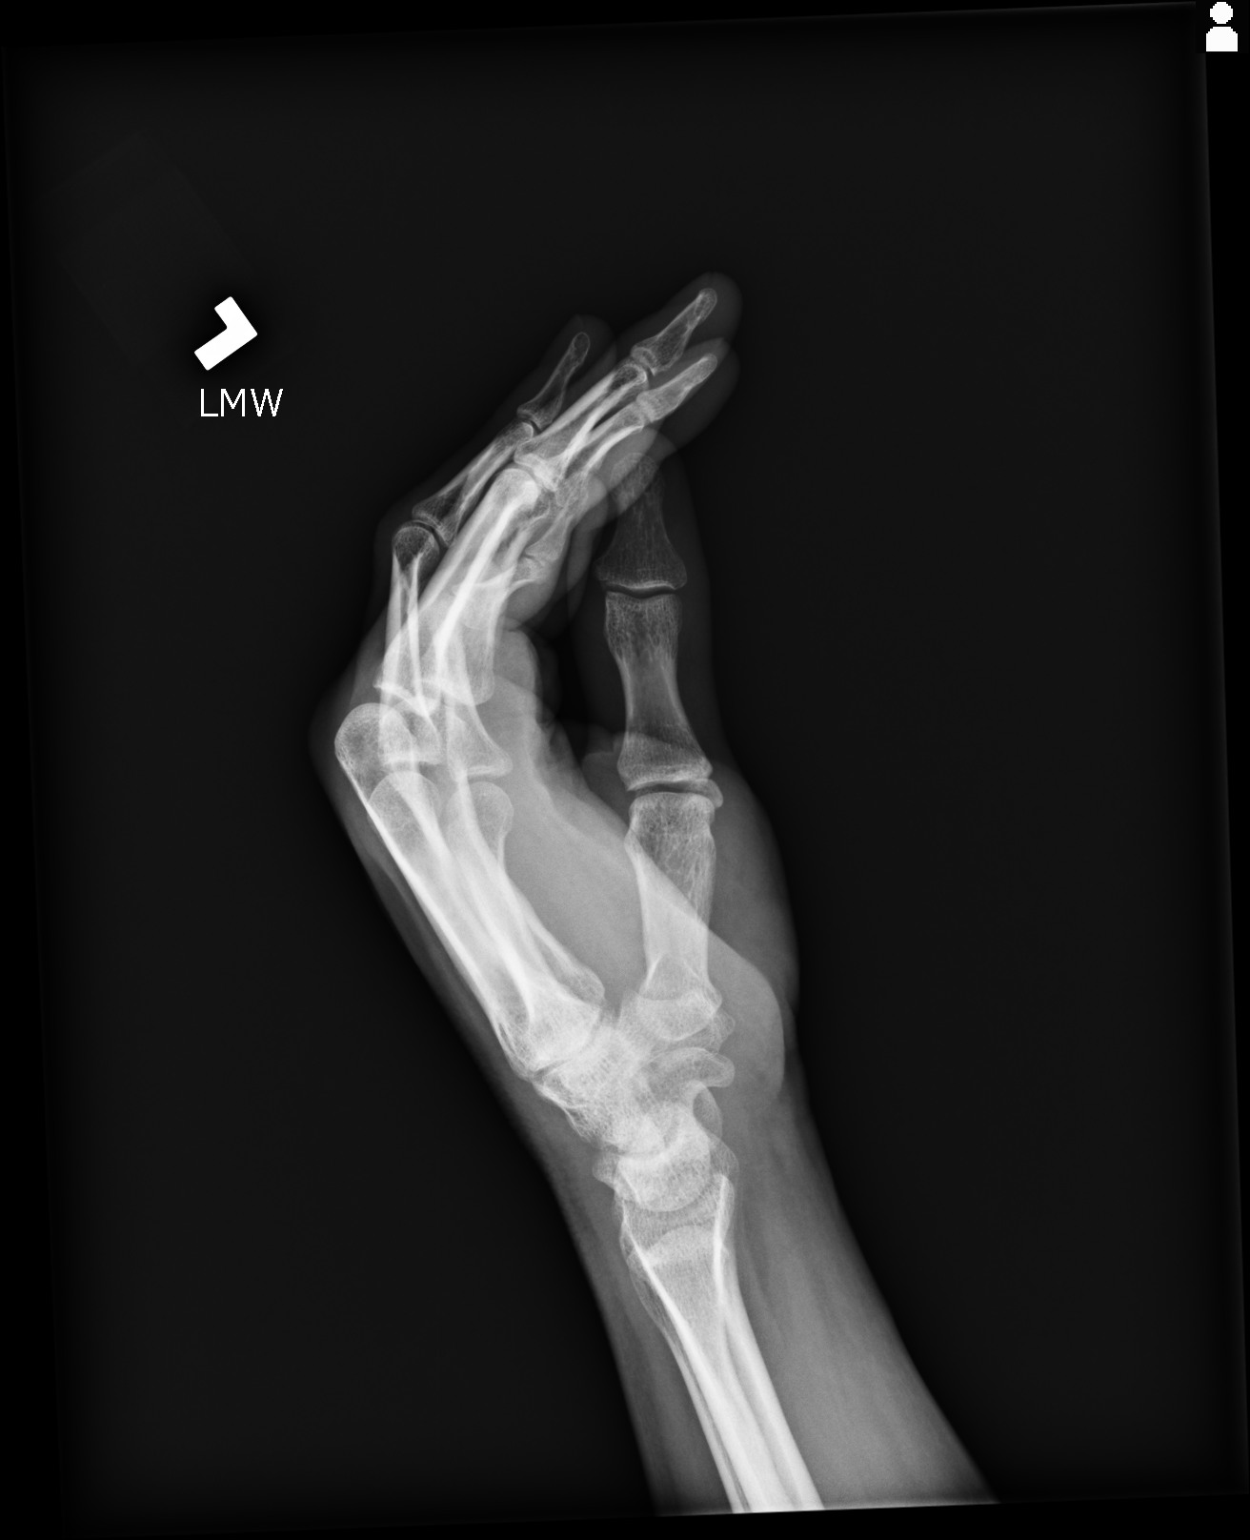

[3 of 3 positions shown; findings below may reference images not displayed]

FINDINGS: Deformity is noted of the fifth proximal interphalangeal joint.
This may be old. Acute subluxation cannot be excluded. Degenerative joint
changes present this joint suggesting this is chronic deformity. No acute
bony or joint abnormality otherwise noted.
IMPRESSION: Deformity of the left fifth digit most likely chronic
subluxation of proximal inter- phalangeal joint. Acute subluxation cannot be
excluded. Otherwise no acute abnormality identified.

## 2015-09-03 ENCOUNTER — Emergency Department: Payer: Medicare Other

## 2015-09-03 ENCOUNTER — Encounter: Payer: Self-pay | Admitting: Emergency Medicine

## 2015-09-03 ENCOUNTER — Inpatient Hospital Stay
Admission: EM | Admit: 2015-09-03 | Discharge: 2015-09-06 | DRG: 917 | Disposition: A | Discharge status: Home / Self Care | Payer: Medicare Other | Attending: Internal Medicine | Admitting: Internal Medicine

## 2015-09-03 DIAGNOSIS — F17200 Nicotine dependence, unspecified, uncomplicated: Secondary | ICD-10-CM | POA: Diagnosis present

## 2015-09-03 DIAGNOSIS — J189 Pneumonia, unspecified organism: Secondary | ICD-10-CM | POA: Diagnosis present

## 2015-09-03 DIAGNOSIS — I10 Essential (primary) hypertension: Secondary | ICD-10-CM | POA: Diagnosis not present

## 2015-09-03 DIAGNOSIS — F419 Anxiety disorder, unspecified: Secondary | ICD-10-CM | POA: Diagnosis not present

## 2015-09-03 DIAGNOSIS — Z8249 Family history of ischemic heart disease and other diseases of the circulatory system: Secondary | ICD-10-CM | POA: Diagnosis not present

## 2015-09-03 DIAGNOSIS — G92 Toxic encephalopathy: Secondary | ICD-10-CM | POA: Diagnosis not present

## 2015-09-03 DIAGNOSIS — E119 Type 2 diabetes mellitus without complications: Secondary | ICD-10-CM | POA: Diagnosis not present

## 2015-09-03 DIAGNOSIS — Z818 Family history of other mental and behavioral disorders: Secondary | ICD-10-CM | POA: Diagnosis not present

## 2015-09-03 DIAGNOSIS — F329 Major depressive disorder, single episode, unspecified: Secondary | ICD-10-CM | POA: Diagnosis present

## 2015-09-03 DIAGNOSIS — K219 Gastro-esophageal reflux disease without esophagitis: Secondary | ICD-10-CM | POA: Diagnosis not present

## 2015-09-03 DIAGNOSIS — Z79899 Other long term (current) drug therapy: Secondary | ICD-10-CM

## 2015-09-03 DIAGNOSIS — J449 Chronic obstructive pulmonary disease, unspecified: Secondary | ICD-10-CM | POA: Diagnosis present

## 2015-09-03 DIAGNOSIS — J9601 Acute respiratory failure with hypoxia: Secondary | ICD-10-CM | POA: Diagnosis not present

## 2015-09-03 DIAGNOSIS — E875 Hyperkalemia: Secondary | ICD-10-CM | POA: Diagnosis not present

## 2015-09-03 DIAGNOSIS — T50902A Poisoning by unspecified drugs, medicaments and biological substances, intentional self-harm, initial encounter: Secondary | ICD-10-CM | POA: Diagnosis present

## 2015-09-03 DIAGNOSIS — J690 Pneumonitis due to inhalation of food and vomit: Secondary | ICD-10-CM | POA: Diagnosis present

## 2015-09-03 DIAGNOSIS — Z915 Personal history of self-harm: Secondary | ICD-10-CM | POA: Diagnosis not present

## 2015-09-03 DIAGNOSIS — F609 Personality disorder, unspecified: Secondary | ICD-10-CM | POA: Diagnosis present

## 2015-09-03 DIAGNOSIS — Z833 Family history of diabetes mellitus: Secondary | ICD-10-CM | POA: Diagnosis not present

## 2015-09-03 DIAGNOSIS — R4182 Altered mental status, unspecified: Secondary | ICD-10-CM

## 2015-09-03 LAB — BLOOD GAS, ARTERIAL
ACID-BASE EXCESS: 6.6 mmol/L — AB (ref 0.0–3.0)
ALLENS TEST (PASS/FAIL): POSITIVE — AB
Bicarbonate: 30.5 mEq/L — ABNORMAL HIGH (ref 21.0–28.0)
FIO2: 0.21
O2 Saturation: 96.5 %
PCO2 ART: 40 mmHg (ref 32.0–48.0)
PH ART: 7.49 — AB (ref 7.350–7.450)
Patient temperature: 37
pO2, Arterial: 79 mmHg — ABNORMAL LOW (ref 83.0–108.0)

## 2015-09-03 LAB — COMPREHENSIVE METABOLIC PANEL
ALK PHOS: 80 U/L (ref 38–126)
ALT: 28 U/L (ref 17–63)
ANION GAP: 5 (ref 5–15)
AST: 26 U/L (ref 15–41)
Albumin: 2.9 g/dL — ABNORMAL LOW (ref 3.5–5.0)
BILIRUBIN TOTAL: 0.5 mg/dL (ref 0.3–1.2)
BUN: 6 mg/dL (ref 6–20)
CALCIUM: 8.3 mg/dL — AB (ref 8.9–10.3)
CO2: 27 mmol/L (ref 22–32)
CREATININE: 0.71 mg/dL (ref 0.61–1.24)
Chloride: 105 mmol/L (ref 101–111)
Glucose, Bld: 82 mg/dL (ref 65–99)
Potassium: 4.7 mmol/L (ref 3.5–5.1)
Sodium: 137 mmol/L (ref 135–145)
TOTAL PROTEIN: 6.7 g/dL (ref 6.5–8.1)

## 2015-09-03 LAB — CBC WITH DIFFERENTIAL/PLATELET
BAND NEUTROPHILS: 0 %
BLASTS: 0 %
Basophils Absolute: 0 10*3/uL (ref 0–0.1)
Basophils Relative: 0 %
EOS ABS: 0 10*3/uL (ref 0–0.7)
Eosinophils Relative: 0 %
HCT: 33.6 % — ABNORMAL LOW (ref 40.0–52.0)
HEMOGLOBIN: 11.5 g/dL — AB (ref 13.0–18.0)
LYMPHS ABS: 1.6 10*3/uL (ref 1.0–3.6)
Lymphocytes Relative: 12 %
MCH: 31.9 pg (ref 26.0–34.0)
MCHC: 34.1 g/dL (ref 32.0–36.0)
MCV: 93.6 fL (ref 80.0–100.0)
METAMYELOCYTES PCT: 0 %
MONO ABS: 1.7 10*3/uL — AB (ref 0.2–1.0)
MONOS PCT: 13 %
MYELOCYTES: 0 %
Neutro Abs: 10 10*3/uL — ABNORMAL HIGH (ref 1.4–6.5)
Neutrophils Relative %: 75 %
Other: 0 %
PLATELETS: 278 10*3/uL (ref 150–440)
Promyelocytes Absolute: 0 %
RBC: 3.59 MIL/uL — AB (ref 4.40–5.90)
RDW: 13.6 % (ref 11.5–14.5)
WBC: 13.3 10*3/uL — AB (ref 3.8–10.6)
nRBC: 0 /100 WBC

## 2015-09-03 LAB — ETHANOL: Alcohol, Ethyl (B): <5 mg/dL (ref <5)

## 2015-09-03 LAB — URINALYSIS COMPLETE WITH MICROSCOPIC (ARMC ONLY)
BILIRUBIN URINE: NEGATIVE
Bacteria, UA: NONE SEEN
GLUCOSE, UA: NEGATIVE mg/dL
HGB URINE DIPSTICK: NEGATIVE
Ketones, ur: NEGATIVE mg/dL
LEUKOCYTES UA: NEGATIVE
NITRITE: NEGATIVE
Protein, ur: NEGATIVE mg/dL
Specific Gravity, Urine: 1.014 (ref 1.005–1.030)
pH: 6 (ref 5.0–8.0)

## 2015-09-03 LAB — TROPONIN I

## 2015-09-03 LAB — URINE DRUG SCREEN, QUALITATIVE (ARMC ONLY)
Amphetamines, Ur Screen: NOT DETECTED
BARBITURATES, UR SCREEN: NOT DETECTED
Benzodiazepine, Ur Scrn: POSITIVE — AB
COCAINE METABOLITE, UR ~~LOC~~: NOT DETECTED
Cannabinoid 50 Ng, Ur ~~LOC~~: NOT DETECTED
MDMA (ECSTASY) UR SCREEN: NOT DETECTED
METHADONE SCREEN, URINE: NOT DETECTED
OPIATE, UR SCREEN: NOT DETECTED
Phencyclidine (PCP) Ur S: NOT DETECTED
TRICYCLIC, UR SCREEN: POSITIVE — AB

## 2015-09-03 LAB — GLUCOSE, CAPILLARY
GLUCOSE-CAPILLARY: 87 mg/dL (ref 65–99)
GLUCOSE-CAPILLARY: 91 mg/dL (ref 65–99)

## 2015-09-03 LAB — SALICYLATE LEVEL

## 2015-09-03 LAB — VALPROIC ACID LEVEL: Valproic Acid Lvl: 65 ug/mL (ref 50.0–100.0)

## 2015-09-03 LAB — ACETAMINOPHEN LEVEL

## 2015-09-03 LAB — MRSA PCR SCREENING: MRSA by PCR: NEGATIVE

## 2015-09-03 MED ORDER — CLONAZEPAM 1 MG PO TABS
1.0000 mg | ORAL_TABLET | Freq: Every day | ORAL | Status: DC
  Administered 2015-09-04 – 2015-09-06 (×3): 1 mg via ORAL
  Filled 2015-09-03 (×3): qty 1

## 2015-09-03 MED ORDER — SODIUM CHLORIDE 0.9 % IV BOLUS (SEPSIS)
1000.0000 mL | Freq: Once | INTRAVENOUS | Status: AC
  Administered 2015-09-03: 1000 mL via INTRAVENOUS
  Filled 2015-09-03: qty 1000

## 2015-09-03 MED ORDER — HEPARIN SODIUM (PORCINE) 5000 UNIT/ML IJ SOLN
5000.0000 [IU] | Freq: Three times a day (TID) | INTRAMUSCULAR | Status: DC
  Administered 2015-09-03 – 2015-09-06 (×10): 5000 [IU] via SUBCUTANEOUS
  Filled 2015-09-03 (×9): qty 1

## 2015-09-03 MED ORDER — DIVALPROEX SODIUM ER 500 MG PO TB24
2000.0000 mg | ORAL_TABLET | Freq: Every day | ORAL | Status: DC
Start: 2015-09-03 — End: 2015-09-06
  Administered 2015-09-03 – 2015-09-05 (×3): 2000 mg via ORAL
  Filled 2015-09-03 (×3): qty 4

## 2015-09-03 MED ORDER — CLONAZEPAM 1 MG PO TABS
2.0000 mg | ORAL_TABLET | Freq: Every day | ORAL | Status: DC
  Administered 2015-09-03 – 2015-09-05 (×3): 2 mg via ORAL
  Filled 2015-09-03 (×3): qty 2

## 2015-09-03 MED ORDER — INFLUENZA VAC SPLIT QUAD 0.5 ML IM SUSY
0.5000 mL | PREFILLED_SYRINGE | INTRAMUSCULAR | Status: AC
  Administered 2015-09-05: 0.5 mL via INTRAMUSCULAR
  Filled 2015-09-03: qty 0.5

## 2015-09-03 MED ORDER — ESCITALOPRAM OXALATE 10 MG PO TABS
20.0000 mg | ORAL_TABLET | Freq: Every day | ORAL | Status: DC
  Administered 2015-09-03 – 2015-09-06 (×4): 20 mg via ORAL
  Filled 2015-09-03 (×4): qty 2

## 2015-09-03 MED ORDER — LEVOFLOXACIN IN D5W 750 MG/150ML IV SOLN
750.0000 mg | Freq: Once | INTRAVENOUS | Status: AC
  Administered 2015-09-03: 750 mg via INTRAVENOUS
  Filled 2015-09-03: qty 150

## 2015-09-03 MED ORDER — SODIUM CHLORIDE 0.9 % IJ SOLN: 3.0000 mL | INTRAMUSCULAR | Status: DC | PRN

## 2015-09-03 MED ORDER — ACETAMINOPHEN 325 MG PO TABS
650.0000 mg | ORAL_TABLET | ORAL | Status: DC | PRN
  Administered 2015-09-03: 650 mg via ORAL
  Filled 2015-09-03: qty 2

## 2015-09-03 MED ORDER — PNEUMOCOCCAL VAC POLYVALENT 25 MCG/0.5ML IJ INJ
0.5000 mL | INJECTION | INTRAMUSCULAR | Status: AC
  Administered 2015-09-05: 0.5 mL via INTRAMUSCULAR
  Filled 2015-09-03: qty 0.5

## 2015-09-03 MED ORDER — SODIUM CHLORIDE 0.9 % IJ SOLN: 3.0000 mL | Freq: Two times a day (BID) | INTRAMUSCULAR | Status: DC

## 2015-09-03 NOTE — ED Notes (Signed)
Patient is resting comfortably. 

## 2015-09-03 NOTE — ED Provider Notes (Signed)
[facility] Emergency Department Provider Note  Time seen: 10:30 AM  I have reviewed the triage vital signs and the nursing notes.   HISTORY  Chief Complaint No chief complaint on file.    HPI [name] is a 39 y.o. male with a past medical history of depression, anxiety, hypertension, gastric reflux, presents the emergency department with altered mental status.According to report, the EMS, the patient was acting normal this morning, went to breakfast normally this morning, went back to his room to take his medications, and was found by a group home staff member to be altered, and EMS was called. Patient has a past medical history of severe depression with multiple overdoses and suicide attempts. Patient has a history of insulin overdoses, although he has not prescribed insulin himself, and is not diabetic. Patient is on Depakote, unclear if this is for behavioral issues or a seizure disorder. Upon presentation to the emergency department the patient is somnolent, but awakens to voice, will answer simple yes/no questions, and will follow simple commands such as squeezing hands. He falls asleep very quickly.     Past Medical History  Diagnosis Date  . Depression   . Anxiety   . Hypertension   . GERD (gastroesophageal reflux disease)     Patient Active Problem List   Diagnosis Date Noted  . Suicide attempt by drug ingestion (HCC) 11/05/2013  . Insulin overdose 11/05/2013  . Alcohol abuse 11/05/2013  . Seizure disorder (HCC) 11/05/2013  . Hypophosphatemia 11/05/2013  . Tobacco abuse 11/05/2013  . Situational depression 11/05/2013  . Rib pain 11/05/2013  . HTN (hypertension) 11/05/2013  . Hypoglycemia 11/05/2013    No past surgical history on file.  Current Outpatient Rx  Name  Route  Sig  Dispense  Refill  . amLODipine (NORVASC) 10 MG tablet   Oral   Take 1 tablet (10 mg total) by mouth daily.   30 tablet   0   . buPROPion (WELLBUTRIN  SR) 150 MG 12 hr tablet   Oral   Take 1 tablet (150 mg total) by mouth 2 (two) times daily.   30 tablet   0   . carbamazepine (TEGRETOL XR) 200 MG 12 hr tablet   Oral   Take 1 tablet (200 mg total) by mouth 2 (two) times daily.   30 tablet   0   . enalapril (VASOTEC) 10 MG tablet   Oral   Take 1 tablet (10 mg total) by mouth daily.   30 tablet   0   . esomeprazole (NEXIUM) 40 MG capsule   Oral   Take 40 mg by mouth daily at 12 noon.         Marland Kitchen. ibuprofen (ADVIL,MOTRIN) 400 MG tablet   Oral   Take 1 tablet (400 mg total) by mouth every 6 (six) hours as needed for mild pain.   30 tablet   0   . Multiple Vitamin (MULTIVITAMIN WITH MINERALS) TABS tablet   Oral   Take 1 tablet by mouth daily.         . mupirocin ointment (BACTROBAN) 2 %   Nasal   Place 1 application into the nose 2 (two) times daily.   22 g   0     Allergies Review of patient's allergies indicates no known allergies.  Family History  Problem Relation Age of Onset  . CAD Mother   . Diabetes Mellitus II Father     Social History Social History  Substance  Use Topics  . Smoking status: Current Every Day Smoker  . Smokeless tobacco: Not on file  . Alcohol Use: Yes     Comment: reports drinking ocasionaly 6 packs on Friday, but has shakes without alcohol    Review of Systems Unable to obtain an adequate review of systems due to somnolence and altered mental status.  ____________________________________________   PHYSICAL EXAM:  Constitutional: Alert, somnolent, but able to answer simple questions and follow simple commands. Eyes: Normal exam, 3 mm PERRL. ENT   Head: Normocephalic and atraumatic.   Mouth/Throat: Mucous membranes are moist. Cardiovascular: Normal rate, regular rhythm. No murmur Respiratory: Normal respiratory effort without tachypnea nor retractions. Breath sounds are clear and equal bilaterally. No wheezes/rales/rhonchi. Gastrointestinal: Soft and nontender. No  distention.   Musculoskeletal: Nontender with normal range of motion in all extremities.  Neurologic:  Slurred speech, somnolence, will move all extremities spontaneously. Skin:  Skin is warm, dry and intact.  Psychiatric: Mood and affect are normal. Speech and behavior are normal.  ____________________________________________    EKG  EKG reviewed and interpreted by myself shows normal sinus rhythm at 63 bpm, narrow QRS, normal axis, normal intervals, nonspecific ST changes present. No ST elevations noted.  ____________________________________________    RADIOLOGY  X-ray consistent with right lower lobe pneumonia.  ____________________________________________    INITIAL IMPRESSION / ASSESSMENT AND PLAN / ED COURSE  Pertinent labs & imaging results that were available during my care of the patient were reviewed by me and considered in my medical decision making (see chart for details).  Patient presents to the emergency department altered mental status. Patient has a history of overdoses, and suicide attempts. Patient has been seen multiple times in the emergency department for insulin overdoses. Patient is prescribed Depakote, it is unclear if this is for behavioral/mood reasons, or seizure disorder. Patient could possibly be postictal. We will check labs including toxicology studies, CT head, chest x-ray, urinalysis, and closely monitor in the emergency department.  Patient will occasionally desat into the 80s with a good waveform. Chest x-ray showing a right lower lobe pneumonia. We will send blood cultures, and start IV Levaquin. Patient will require admission to the hospital.  ____________________________________________   FINAL CLINICAL IMPRESSION(S) / ED DIAGNOSES  Altered mental status Pneumonia  Minna Antis, MD 09/03/15 1208

## 2015-09-03 NOTE — Consult Note (Signed)
[facility] Face-to-Face Psychiatry Consult   Reason for Consult:  Consult for this 39 year old man who came into the hospital after being found with altered mental status. Turns out to of overdosed. Referring Physician:  Volanda Napoleon Patient Identification: Odai Wimmer MRN:  [id number] Principal Diagnosis: Overdose Diagnosis:   Patient Active Problem List   Diagnosis Date Noted  . Acute respiratory failure with hypoxia ([place]) [J96.01] 09/03/2015  . Overdose [T50.901A] 09/03/2015  . Depression [F32.9] 09/03/2015  . Suicide attempt by drug ingestion ([place]) [T50.902A] 11/05/2013  . Insulin overdose [T38.3X1A] 11/05/2013  . Alcohol abuse [F10.10] 11/05/2013  . Seizure disorder ([place]) [T02.409] 11/05/2013  . Hypophosphatemia [E83.39] 11/05/2013  . Tobacco abuse [Z72.0] 11/05/2013  . Situational depression [F43.21] 11/05/2013  . Rib pain [R07.81] 11/05/2013  . HTN (hypertension) [I10] 11/05/2013  . Hypoglycemia [E16.2] 11/05/2013    Total Time spent with patient: 1 hour  Subjective:   Batu Cassin is a 39 y.o. male patient admitted with "I've just been feeling real depressed".  HPI:  Information from the patient and the chart. Patient interviewed. Chart reviewed. Labs reviewed vital signs reviewed. This 39 year old man was brought from his group home after being found with altered mental status. Patient tells me that he overdosed on Benadryl. He says he took about 30 pills. He tells me that he did it because he has been feeling depressed and wished that he were dead. His mood stays depressed most of the time and has been that way for several months. His sleep is not very good at night. He has very little that he has interest in an little that he enjoys during the day. He denies auditory or visual hallucinations. Denies any recent substance abuse. He says he has been compliant with the prescribed medicine he is taking psychiatrically. Most recent trauma that really bothers him is breaking up with his  girlfriend several months ago.  Past psychiatric history: Patient has a long history of multiple suicide attempts by insulin ingestion when he was living on his own. He does not have diabetes but would steal insulin from his father and then try to kill himself. After a great deal of treatment and work with this patient my feeling was that his problem was a severe personality disorder, some degree of intellectual impairment and alcohol abuse. Ultimately he was found incompetent and a guardian was appointed and he was placed in a group home. Since then he has pretty much stayed out of the hospital. He has been treated for depressive symptoms by an outpatient doctor while living in his current group home.  Social history: Patient's parents are both deceased. He has no really close relatives he stays in touch with. Used to have a girlfriend he stay close with but apparently they have broken up. He has been found incompetent and has a legal guardian appointed and lives in a group home.  Medical history: Patient has a history of COPD hypertension. He does not have a seizure disorder. He has a past history of alcohol abuse but has not had access to it recently.  Family history: Family history was positive for depression and some alcohol use although it's not clear how bad it was.  Substance abuse history: When he was living independently he had a real problem with alcohol abuse. Drinking just a beer or 2 would usually put him in such a bad mood he would try to kill himself.  Past Psychiatric History: Past history of multiple suicide attempts multiple episodes of depression  but atypical presentation more like a personality disorder. Has some chronically low cognitive achievement.  Risk to Self: Is patient at risk for suicide?: Patient is at chronically elevated risk of suicide and is known to make attempts when he can get his hands on something to overdose on. Risk to Others:   no known risk to others Prior  Inpatient Therapy:   lots of prior inpatient treatment Prior Outpatient Therapy:   ongoing outpatient treatment  Past Medical History:  Past Medical History  Diagnosis Date  . Depression   . Anxiety   . Hypertension   . GERD (gastroesophageal reflux disease)    History reviewed. No pertinent past surgical history. Family History:  Family History  Problem Relation Age of Onset  . CAD Mother   . Diabetes Mellitus II Father    Family Psychiatric  History: Patient has some family history of depression and alcohol abuse. Social History:  History  Alcohol Use No    Comment: pt states he no longer drinks     History  Drug Use No    Comment: had ecstacy in urine but deneis    Social History   Social History  . Marital Status: Single    Spouse Name: N/A  . Number of Children: N/A  . Years of Education: N/A   Social History Main Topics  . Smoking status: Current Every Day Smoker  . Smokeless tobacco: None  . Alcohol Use: No     Comment: pt states he no longer drinks  . Drug Use: No     Comment: had ecstacy in urine but deneis  . Sexual Activity: Not Asked   Other Topics Concern  . None   Social History Narrative   Additional Social History:                          Allergies:  No Known Allergies  Labs:  Results for orders placed or performed during the hospital encounter of 09/03/15 (from the past 48 hour(s))  Glucose, capillary     Status: None   Collection Time: 09/03/15 10:30 AM  Result Value Ref Range   Glucose-Capillary 87 65 - 99 mg/dL  Comprehensive metabolic panel     Status: Abnormal   Collection Time: 09/03/15 10:42 AM  Result Value Ref Range   Sodium 137 135 - 145 mmol/L   Potassium 4.7 3.5 - 5.1 mmol/L    Comment: HEMOLYSIS AT THIS LEVEL MAY AFFECT RESULT   Chloride 105 101 - 111 mmol/L   CO2 27 22 - 32 mmol/L   Glucose, Bld 82 65 - 99 mg/dL   BUN 6 6 - 20 mg/dL   Creatinine, Ser 0.71 0.61 - 1.24 mg/dL   Calcium 8.3 (L) 8.9 - 10.3  mg/dL   Total Protein 6.7 6.5 - 8.1 g/dL   Albumin 2.9 (L) 3.5 - 5.0 g/dL   AST 26 15 - 41 U/L   ALT 28 17 - 63 U/L   Alkaline Phosphatase 80 38 - 126 U/L   Total Bilirubin 0.5 0.3 - 1.2 mg/dL   GFR calc non Af Amer >60 >60 mL/min   GFR calc Af Amer >60 >60 mL/min    Comment: (NOTE) The eGFR has been calculated using the CKD EPI equation. This calculation has not been validated in all clinical situations. eGFR's persistently <60 mL/min signify possible Chronic Kidney Disease.    Anion gap 5 5 - 15  Ethanol  Status: None   Collection Time: 09/03/15 10:42 AM  Result Value Ref Range   Alcohol, Ethyl (B) <5 <5 mg/dL    Comment:        LOWEST DETECTABLE LIMIT FOR SERUM ALCOHOL IS 5 mg/dL FOR MEDICAL PURPOSES ONLY   Troponin I     Status: None   Collection Time: 09/03/15 10:42 AM  Result Value Ref Range   Troponin I <0.03 <0.031 ng/mL    Comment:        NO INDICATION OF MYOCARDIAL INJURY.   CBC with Differential     Status: Abnormal   Collection Time: 09/03/15 10:42 AM  Result Value Ref Range   WBC 13.3 (H) 3.8 - 10.6 K/uL   RBC 3.59 (L) 4.40 - 5.90 MIL/uL   Hemoglobin 11.5 (L) 13.0 - 18.0 g/dL   HCT 33.6 (L) 40.0 - 52.0 %   MCV 93.6 80.0 - 100.0 fL   MCH 31.9 26.0 - 34.0 pg   MCHC 34.1 32.0 - 36.0 g/dL   RDW 13.6 11.5 - 14.5 %   Platelets 278 150 - 440 K/uL    Comment: PLATELET CLUMPS NOTED ON SMEAR, COUNT APPEARS ADEQUATE   Neutrophils Relative % 75 %   Lymphocytes Relative 12 %   Monocytes Relative 13 %   Eosinophils Relative 0 %   Basophils Relative 0 %   Band Neutrophils 0 %   Metamyelocytes Relative 0 %   Myelocytes 0 %   Promyelocytes Absolute 0 %   Blasts 0 %   nRBC 0 0 /100 WBC   Other 0 %   Neutro Abs 10.0 (H) 1.4 - 6.5 K/uL   Lymphs Abs 1.6 1.0 - 3.6 K/uL   Monocytes Absolute 1.7 (H) 0.2 - 1.0 K/uL   Eosinophils Absolute 0.0 0 - 0.7 K/uL   Basophils Absolute 0.0 0 - 0.1 K/uL   Smear Review MORPHOLOGY UNREMARKABLE   Acetaminophen level      Status: Abnormal   Collection Time: 09/03/15 10:42 AM  Result Value Ref Range   Acetaminophen (Tylenol), Serum <10 (L) 10 - 30 ug/mL    Comment:        THERAPEUTIC CONCENTRATIONS VARY SIGNIFICANTLY. A RANGE OF 10-30 ug/mL MAY BE AN EFFECTIVE CONCENTRATION FOR MANY PATIENTS. HOWEVER, SOME ARE BEST TREATED AT CONCENTRATIONS OUTSIDE THIS RANGE. ACETAMINOPHEN CONCENTRATIONS >150 ug/mL AT 4 HOURS AFTER INGESTION AND >50 ug/mL AT 12 HOURS AFTER INGESTION ARE OFTEN ASSOCIATED WITH TOXIC REACTIONS.   Salicylate level     Status: None   Collection Time: 09/03/15 10:42 AM  Result Value Ref Range   Salicylate Lvl <5.7 2.8 - 30.0 mg/dL  Valproic acid level     Status: None   Collection Time: 09/03/15 10:42 AM  Result Value Ref Range   Valproic Acid Lvl 65 50.0 - 100.0 ug/mL  Urinalysis complete, with microscopic     Status: Abnormal   Collection Time: 09/03/15 10:43 AM  Result Value Ref Range   Color, Urine YELLOW (A) YELLOW   APPearance CLEAR (A) CLEAR   Glucose, UA NEGATIVE NEGATIVE mg/dL   Bilirubin Urine NEGATIVE NEGATIVE   Ketones, ur NEGATIVE NEGATIVE mg/dL   Specific Gravity, Urine 1.014 1.005 - 1.030   Hgb urine dipstick NEGATIVE NEGATIVE   pH 6.0 5.0 - 8.0   Protein, ur NEGATIVE NEGATIVE mg/dL   Nitrite NEGATIVE NEGATIVE   Leukocytes, UA NEGATIVE NEGATIVE   RBC / HPF 0-5 0 - 5 RBC/hpf   WBC, UA 0-5 0 - 5 WBC/hpf  Bacteria, UA NONE SEEN NONE SEEN   Squamous Epithelial / LPF 0-5 (A) NONE SEEN   Mucous PRESENT   Urine Drug Screen, Qualitative     Status: Abnormal   Collection Time: 09/03/15 10:43 AM  Result Value Ref Range   Tricyclic, Ur Screen POSITIVE (A) NONE DETECTED   Amphetamines, Ur Screen NONE DETECTED NONE DETECTED   MDMA (Ecstasy)Ur Screen NONE DETECTED NONE DETECTED   Cocaine Metabolite,Ur [place] NONE DETECTED NONE DETECTED   Opiate, Ur Screen NONE DETECTED NONE DETECTED   Phencyclidine (PCP) Ur S NONE DETECTED NONE DETECTED   Cannabinoid 50 Ng, Ur [place] NONE  DETECTED NONE DETECTED   Barbiturates, Ur Screen NONE DETECTED NONE DETECTED   Benzodiazepine, Ur Scrn POSITIVE (A) NONE DETECTED   Methadone Scn, Ur NONE DETECTED NONE DETECTED    Comment: (NOTE) 355  Tricyclics, urine               Cutoff 1000 ng/mL 200  Amphetamines, urine             Cutoff 1000 ng/mL 300  MDMA (Ecstasy), urine           Cutoff 500 ng/mL 400  Cocaine Metabolite, urine       Cutoff 300 ng/mL 500  Opiate, urine                   Cutoff 300 ng/mL 600  Phencyclidine (PCP), urine      Cutoff 25 ng/mL 700  Cannabinoid, urine              Cutoff 50 ng/mL 800  Barbiturates, urine             Cutoff 200 ng/mL 900  Benzodiazepine, urine           Cutoff 200 ng/mL 1000 Methadone, urine                Cutoff 300 ng/mL 1100 1200 The urine drug screen provides only a preliminary, unconfirmed 1300 analytical test result and should not be used for non-medical 1400 purposes. Clinical consideration and professional judgment should 1500 be applied to any positive drug screen result due to possible 1600 interfering substances. A more specific alternate chemical method 1700 must be used in order to obtain a confirmed analytical result.  1800 Gas chromato graphy / mass spectrometry (GC/MS) is the preferred 1900 confirmatory method.   Blood gas, arterial     Status: Abnormal   Collection Time: 09/03/15  1:00 PM  Result Value Ref Range   FIO2 0.21    pH, Arterial 7.49 (H) 7.350 - 7.450   pCO2 arterial 40 32.0 - 48.0 mmHg   pO2, Arterial 79 (L) 83.0 - 108.0 mmHg   Bicarbonate 30.5 (H) 21.0 - 28.0 mEq/L   Acid-Base Excess 6.6 (H) 0.0 - 3.0 mmol/L   O2 Saturation 96.5 %   Patient temperature 37.0    Collection site RIGHT BRACHIAL    Sample type ARTERIAL DRAW    Allens test (pass/fail) POSITIVE (A) PASS  Glucose, capillary     Status: None   Collection Time: 09/03/15  1:49 PM  Result Value Ref Range   Glucose-Capillary 91 65 - 99 mg/dL  MRSA PCR Screening     Status: None    Collection Time: 09/03/15  2:28 PM  Result Value Ref Range   MRSA by PCR NEGATIVE NEGATIVE    Comment:        The GeneXpert MRSA Assay (FDA approved for NASAL specimens only),  is one component of a comprehensive MRSA colonization surveillance program. It is not intended to diagnose MRSA infection nor to guide or monitor treatment for MRSA infections.   Troponin I     Status: None   Collection Time: 09/03/15  3:36 PM  Result Value Ref Range   Troponin I <0.03 <0.031 ng/mL    Comment:        NO INDICATION OF MYOCARDIAL INJURY.     Current Facility-Administered Medications  Medication Dose Route Frequency Provider Last Rate Last Dose  . heparin injection 5,000 Units  5,000 Units Subcutaneous 3 times per day Aldean Jewett, MD   5,000 Units at 09/03/15 1800  . sodium chloride 0.9 % injection 3 mL  3 mL Intravenous PRN Aldean Jewett, MD        Musculoskeletal: Strength & Muscle Tone: within normal limits Gait & Station: unable to stand Patient leans: N/A  Psychiatric Specialty Exam: Review of Systems  Constitutional: Negative.   HENT: Negative.   Eyes: Negative.   Respiratory: Positive for cough and shortness of breath.   Cardiovascular: Negative.   Gastrointestinal: Negative.   Musculoskeletal: Negative.   Skin: Negative.   Neurological: Negative.   Psychiatric/Behavioral: Positive for depression, suicidal ideas and memory loss. Negative for hallucinations and substance abuse. The patient is nervous/anxious and has insomnia.     Blood pressure 115/76, pulse 66, temperature 98.2 F (36.8 C), temperature source Oral, resp. rate 24, height $RemoveBe'5\' 11"'kMmjxtGPU$  (1.803 m), weight 82.5 kg (181 lb 14.1 oz), SpO2 98 %.Body mass index is 25.38 kg/(m^2).  General Appearance: Casual  Eye Contact::  Minimal  Speech:  Slow  Volume:  Decreased  Mood:  Depressed  Affect:  Depressed  Thought Process:  Circumstantial  Orientation:  Full (Time, Place, and Person)  Thought Content:   Negative  Suicidal Thoughts:  Yes.  with intent/plan  Homicidal Thoughts:  No  Memory:  Immediate;   Fair Recent;   Fair Remote;   Fair  Judgement:  Poor  Insight:  Shallow  Psychomotor Activity:  Decreased  Concentration:  Fair  Recall:  [employer] of Knowledge:Fair  Language: Fair  Akathisia:  No  Handed:  Right  AIMS (if indicated):     Assets:  Housing Resilience  ADL's:  Intact  Cognition: Impaired,  Mild  Sleep:      Treatment Plan Summary: Medication management and Plan This is a 39 year old man with chronic depression and personality disorder who once again has tried to kill himself. He is telling me now that he really doesn't have strong suicidal thoughts but is still feeling depressed. Affect looks down and depressed. Patient will continue on precautions for now. I will reevaluate him tomorrow. We can consider at that time whether he needs to be admitted to psychiatry or not. He typically in the past has gotten better pretty quickly. For now continue current medicine.  Disposition: Supportive therapy provided about ongoing stressors. Discussed crisis plan, support from social network, calling 911, coming to the Emergency Department, and calling Suicide Hotline.  Christinia Lambeth 09/03/2015 7:40 PM

## 2015-09-03 NOTE — H&P (Signed)
[facility] [place] [facility]   PATIENT NAME: [name]    MR#:  [id number]  DATE OF BIRTH:  November 23, 1975  DATE OF ADMISSION:  09/03/2015  PRIMARY CARE PHYSICIAN: Pcp Not In System   REQUESTING/REFERRING PHYSICIAN: Dr. Lenard Lance  CHIEF COMPLAINT:   Chief Complaint  Patient presents with  . Altered Mental Status    HISTORY OF PRESENT ILLNESS:  Tramaine Sauls  is a 39 y.o. male with a known history of depression, anxiety, multiple suicide attempts in the past, hypertension and GERD presents today from assisted living facility due to altered mental status. [facility] report he walked to breakfast as usual and then return to his room to take medications. Shortly afterward he was found altered and somnolent. Chest x-ray shows a right lower lobe pneumonia. Concern for aspiration and possible benzodiazepine overdose.  PAST MEDICAL HISTORY:   Past Medical History  Diagnosis Date  . Depression   . Anxiety   . Hypertension   . GERD (gastroesophageal reflux disease)     PAST SURGICAL HISTORY:  History reviewed. No pertinent past surgical history.  SOCIAL HISTORY:   Social History  Substance Use Topics  . Smoking status: Current Every Day Smoker  . Smokeless tobacco: Not on file  . Alcohol Use: No     Comment: pt states he no longer drinks    FAMILY HISTORY:   Family History  Problem Relation Age of Onset  . CAD Mother   . Diabetes Mellitus II Father     DRUG ALLERGIES:  No Known Allergies  REVIEW OF SYSTEMS:   ROS unable to obtain due to patient altered mental status  MEDICATIONS AT HOME:   Prior to Admission medications   Medication Sig Start Date End Date Taking? Authorizing Provider  amLODipine (NORVASC) 2.5 MG tablet Take 2.5 mg by mouth daily.   Yes Historical Provider, MD  buPROPion (WELLBUTRIN XL) 300 MG 24 hr tablet Take 300 mg by mouth daily.   Yes Historical Provider, MD  clonazePAM (KLONOPIN) 1 MG tablet Take  1-2 mg by mouth See admin instructions. 1 tablet every morning, 2 tablets every afternoon, and 2 tablets at bedtime   Yes Historical Provider, MD  divalproex (DEPAKOTE ER) 500 MG 24 hr tablet Take 2,000 mg by mouth at bedtime.   Yes Historical Provider, MD  escitalopram (LEXAPRO) 20 MG tablet Take 20 mg by mouth every morning.   Yes Historical Provider, MD  esomeprazole (NEXIUM) 40 MG capsule Take 40 mg by mouth daily.    Yes Historical Provider, MD  hydrOXYzine (ATARAX/VISTARIL) 50 MG tablet Take 50 mg by mouth 3 (three) times daily as needed for anxiety.   Yes Historical Provider, MD  Melatonin 5 MG TABS Take 10 mg by mouth at bedtime.   Yes Historical Provider, MD  meloxicam (MOBIC) 15 MG tablet Take 15 mg by mouth daily.   Yes Historical Provider, MD  Multiple Vitamins-Minerals (THEREMS-M) TABS Take 1 tablet by mouth daily.   Yes Historical Provider, MD  traZODone (DESYREL) 100 MG tablet Take 300 mg by mouth at bedtime.   Yes Historical Provider, MD      VITAL SIGNS:  Blood pressure 117/74, pulse 62, temperature 98 F (36.7 C), temperature source Axillary, resp. rate 24, height  (1.803 m), weight 87.091 kg (192 lb), SpO2 82 %.  PHYSICAL EXAMINATION:  GENERAL:  39 y.o.-year-old patient lying in the bed with no acute distress. Somnolent, opens eyes to voice follows simple commands EYES:  Rolls eyes away from light, cannot evaluate pupils. No scleral icterus. Extraocular muscles intact.  HEENT: Head atraumatic, normocephalic. Oropharynx and nasopharynx clear. Mucous membranes are moist NECK:  Supple, no jugular venous distention. No thyroid enlargement, no tenderness.  LUNGS: Normal breath sounds bilaterally, no wheezing, rales,rhonchi or crepitation. No use of accessory muscles of respiration.  CARDIOVASCULAR: S1, S2 normal. No murmurs, rubs, or gallops.  ABDOMEN: Soft, nontender, nondistended. Bowel sounds present. No organomegaly or mass.  EXTREMITIES: No pedal edema, cyanosis, or  clubbing. Peripheral pulses are 2+ NEUROLOGIC: Cranial nerves II through XII are grossly intact. Muscle strength 5/5 in all extremities. Slow to respond to commands, only following simple commands such as to move extremities PSYCHIATRIC: The patient is somnolent, oriented to person only  SKIN: No obvious rash, lesion, or ulcer. Multiple tattoos  LABORATORY PANEL:   CBC  Recent Labs Lab 09/03/15 1042  WBC 13.3*  HGB 11.5*  HCT 33.6*  PLT 278   ------------------------------------------------------------------------------------------------------------------  Chemistries   Recent Labs Lab 09/03/15 1042  NA 137  K 4.7  CL 105  CO2 27  GLUCOSE 82  BUN 6  CREATININE 0.71  CALCIUM 8.3*  AST 26  ALT 28  ALKPHOS 80  BILITOT 0.5   ------------------------------------------------------------------------------------------------------------------  Cardiac Enzymes  Recent Labs Lab 09/03/15 1042  TROPONINI <0.03   ------------------------------------------------------------------------------------------------------------------  RADIOLOGY:  Ct Head Wo Contrast  09/03/2015  CLINICAL DATA:  Altered mental status. EXAM: CT HEAD WITHOUT CONTRAST TECHNIQUE: Contiguous axial images were obtained from the base of the skull through the vertex without intravenous contrast. COMPARISON:  06/19/2012 FINDINGS: There is no evidence of mass effect, midline shift or extra-axial fluid collections. There is no evidence of a space-occupying lesion or intracranial hemorrhage. There is no evidence of a cortical-based area of acute infarction. The ventricles and sulci are appropriate for the patient's age. The basal cisterns are patent. Visualized portions of the orbits are unremarkable. The visualized portions of the paranasal sinuses and mastoid air cells are unremarkable. The osseous structures are unremarkable. IMPRESSION: No acute intracranial pathology. Electronically Signed   By: Elige Ko    On: 09/03/2015 11:13   Dg Chest Portable 1 View  09/03/2015  CLINICAL DATA:  Altered mental status EXAM: PORTABLE CHEST 1 VIEW COMPARISON:  11/24/2013 FINDINGS: Cardiomediastinal silhouette is stable. There is infiltrate/pneumonia in right lower lobe laterally. No pulmonary edema. Left lung is clear. IMPRESSION: Infiltrate/ pneumonia in right lower lobe lateral segment. No pulmonary edema. Electronically Signed   By: Natasha Mead M.D.   On: 09/03/2015 11:04    EKG:   Orders placed or performed in visit on 07/12/14  . EKG 12-Lead    IMPRESSION AND PLAN:   #1 acute respiratory failure with hypoxia: Oxygen saturation fluctuating between low 80s to low 90s. Chest x-ray showing right lower lobe infiltrate consistent with aspiration. Mental status is concerning for possible overdose he does take Klonopin. I am admitting him to the stepdown unit for continuous pulse oximetry monitoring and close observation of his respiratory status. CT scan of his head is negative. He has a history of multiple suicide attempts with insulin will check CBGs every 30 minutes 4. Urine drug screen is pending. ABG is pending. Check prolactin to evaluate for possible post ictal somnolence, no seizure activity is noted  #2 right lower lobe pneumonia: Blood cultures pending. Levaquin started. Likely due to aspiration. He is nothing by mouth at this time due to somnolence.  #3 hyperkalemia: No significant EKG changes. Continue to  monitor recheck potassium this afternoon  #4 depression with multiple suicide attempts in the past, possible overdose: Obtain psychiatry consult. Hold indications for now  #5 gastroesophageal reflux: Resume PPI when taking by mouth.  CODE STATUS: Full  TOTAL TIME TAKING CARE OF THIS PATIENT: 45 minutes.  Greater than 50% of time spent in coordination of care and counseling. Care plan discussed with emergency room physician.  Elby ShowersWALSH, CATHERINE M.D on 09/03/2015 at 1:10 PM  Between 7am to 6pm -  Pager - [phone]  After 6pm go to [email or web address] - password [email or web address]  Fabio [facility]  Office  [phone]  CC: Primary care physician; Pcp Not In System

## 2015-09-03 NOTE — Progress Notes (Signed)
MD notified patient with fever, 101.4 axillary, with no current medications ordered to reduce fever. Order for Acetaminophen 650mg  PO q4 PRN obtained.

## 2015-09-03 NOTE — ED Notes (Signed)
Pt brought in via EMS from [facility]; pt A/O to self only, having difficult time answering any questions at this time. Report handed off via EMS that pt is normally A/O and ambulatory; after breakfast today pt became weak, developed a cough, and mental status altered.

## 2015-09-04 LAB — CBC
HEMATOCRIT: 34.8 % — AB (ref 40.0–52.0)
HEMOGLOBIN: 11.8 g/dL — AB (ref 13.0–18.0)
MCH: 31.9 pg (ref 26.0–34.0)
MCHC: 33.9 g/dL (ref 32.0–36.0)
MCV: 94.3 fL (ref 80.0–100.0)
Platelets: 319 10*3/uL (ref 150–440)
RBC: 3.69 MIL/uL — ABNORMAL LOW (ref 4.40–5.90)
RDW: 13.7 % (ref 11.5–14.5)
WBC: 9 10*3/uL (ref 3.8–10.6)

## 2015-09-04 LAB — BASIC METABOLIC PANEL
ANION GAP: 6 (ref 5–15)
BUN: 5 mg/dL — AB (ref 6–20)
CHLORIDE: 103 mmol/L (ref 101–111)
CO2: 31 mmol/L (ref 22–32)
Calcium: 9.1 mg/dL (ref 8.9–10.3)
Creatinine, Ser: 0.8 mg/dL (ref 0.61–1.24)
GFR calc Af Amer: >60 mL/min (ref >60)
GLUCOSE: 89 mg/dL (ref 65–99)
POTASSIUM: 3.9 mmol/L (ref 3.5–5.1)
Sodium: 140 mmol/L (ref 135–145)

## 2015-09-04 LAB — GLUCOSE, CAPILLARY: GLUCOSE-CAPILLARY: 71 mg/dL (ref 65–99)

## 2015-09-04 LAB — PROLACTIN: PROLACTIN: 31.4 ng/mL — AB (ref 4.0–15.2)

## 2015-09-04 MED ORDER — LEVOFLOXACIN IN D5W 750 MG/150ML IV SOLN
750.0000 mg | INTRAVENOUS | Status: DC
  Administered 2015-09-04: 750 mg via INTRAVENOUS
  Filled 2015-09-04 (×2): qty 150

## 2015-09-04 NOTE — Evaluation (Signed)
Clinical/Bedside Swallow Evaluation Patient Details  Name: [name] MRN: [id number] Date of Birth: June 07, 1976  Today's Date: 09/04/2015 Time: SLP Start Time (ACUTE ONLY): 1100 SLP Stop Time (ACUTE ONLY): 1200 SLP Time Calculation (min) (ACUTE ONLY): 60 min  Past Medical History:  Past Medical History  Diagnosis Date  . Depression   . Anxiety   . Hypertension   . GERD (gastroesophageal reflux disease)    Past Surgical History: History reviewed. No pertinent past surgical history. HPI:  Pt is a 39 y.o. male with a known history of GERD, depression, anxiety, multiple suicide attempts in the past, hypertension and GERD presents today from assisted living facility due to altered mental status. Facility reported he walked to breakfast as usual and then return to his room to take medications. Shortly afterward he was found altered and somnolent. Chest x-ray shows a right lower lobe pneumonia. Concern for aspiration and possible benzodiazepine overdose. Pt was lethargic yesterday but awake today and conversive wanting to eat/drink.    Assessment / Plan / Recommendation Clinical Impression  Pt appeared to adequately tolerate trials of thin liquids and solids w/ no overt s/s of aspiration noted; no oral phase deficits noted. No decline in status noted during/post po trials. Pt was able to feed himself w/ min. support sec. to shaky UEs. Pt appears at reduced risk for aspiration at this time. Rec. a regular diet w/ thin liquids w/ general aspiration precautions. No further skilled ST services indicated at this time. NSG/MD to reconsult if any change in status. NSG updated; pt/NSG agreed.    Aspiration Risk   (reduced)    Diet Recommendation Age appropriate regular solids;Thin   Medication Administration: Whole meds with liquid Compensations: Slow rate;Small sips/bites    Other  Recommendations Oral Care Recommendations: Oral care BID;Patient independent with oral care   Follow Up  Recommendations       Frequency and Duration        Pertinent Vitals/Pain denied    SLP Swallow Goals  n/a   Swallow Study Prior Functional Status   lived independently w/ no report of dysphagia.     General Date of Onset: 09/03/15 Other Pertinent Information: Pt is a 39 y.o. male with a known history of GERD, depression, anxiety, multiple suicide attempts in the past, hypertension and GERD presents today from assisted living facility due to altered mental status. Facility reported he walked to breakfast as usual and then return to his room to take medications. Shortly afterward he was found altered and somnolent. Chest x-ray shows a right lower lobe pneumonia. Concern for aspiration and possible benzodiazepine overdose. Pt was lethargic yesterday but awake today and conversive wanting to eat/drink.  Type of Study: Bedside swallow evaluation Previous Swallow Assessment: none Diet Prior to this Study: Regular;Thin liquids Temperature Spikes Noted: No Respiratory Status: Room air History of Recent Intubation: No Behavior/Cognition: Alert;Cooperative Oral Cavity - Dentition: Adequate natural dentition/normal for age Self-Feeding Abilities: Able to feed self;Needs assist;Needs set up Patient Positioning: Upright in bed Baseline Vocal Quality: Normal;Low vocal intensity Volitional Cough: Strong Volitional Swallow: Able to elicit    Oral/Motor/Sensory Function Overall Oral Motor/Sensory Function: Appears within functional limits for tasks assessed Labial ROM: Within Functional Limits Labial Symmetry: Within Functional Limits Labial Strength: Within Functional Limits Lingual ROM: Within Functional Limits Lingual Symmetry: Within Functional Limits Lingual Strength: Within Functional Limits Facial Symmetry: Within Functional Limits Mandible: Within Functional Limits   Ice Chips Ice chips: Within functional limits Presentation: Spoon (fed; 3 trials)  Thin Liquid Thin Liquid: Within  functional limits Presentation: Cup;Self Fed;Straw (~4 ozs total)    Nectar Thick Nectar Thick Liquid: Not tested   Honey Thick Honey Thick Liquid: Not tested   Puree Puree: Within functional limits Presentation: Self Fed;Spoon (~2-3 ozs)   Solid   GO    Solid: Within functional limits Presentation: Self Fed;Spoon (3 trials)      Jerilynn SomKatherine Josilynn Losh, MS, CCC-SLP  Ricardo Schubach 09/04/2015,3:11 PM

## 2015-09-04 NOTE — Progress Notes (Signed)
Initial Nutrition Assessment   INTERVENTION:   Meals and Snacks: Cater to patient preferences; s/p SLP evaluation Medical Food Supplement Therapy: will send [employer] Instant Breakfast BID for added nutrition.   NUTRITION DIAGNOSIS:   Inadequate oral intake related to poor appetite as evidenced by per patient/family report  GOAL:   Patient will meet greater than or equal to 90% of their needs  MONITOR:    (Energy Intake, Electrolyte and Renal Profile, Anthropometrics)  REASON FOR ASSESSMENT:   Malnutrition Screening Tool    ASSESSMENT:   Pt admitted s/p overdose with questionable aspiration event as pt admitted with pna. Pt h/o multiple suicide attempts with insulin.  Past Medical History  Diagnosis Date  . Depression   . Anxiety   . Hypertension   . GERD (gastroesophageal reflux disease)     Diet Order:  Diet regular Room service appropriate?: Yes; Fluid consistency:: Thin   Current Nutrition: Pt ate 100% of hot dog, french fries and 3 chocolate milks on visit this afternoon.   Food/Nutrition-Related History: Pt reports poor appetite for a few days PTA, 'just didn't want to eat.'    Medications: klonopin, depakote  Electrolyte/Renal Profile and Glucose Profile:   Recent Labs Lab 09/03/15 1042 09/04/15 0533  NA 137 140  K 4.7 3.9  CL 105 103  CO2 27 31  BUN 6 5*  CREATININE 0.71 0.80  CALCIUM 8.3* 9.1  GLUCOSE 82 89   Protein Profile:   Recent Labs Lab 09/03/15 1042  ALBUMIN 2.9*    Gastrointestinal Profile: Last BM:  09/03/2015   Nutrition-Focused Physical Exam Findings: Nutrition-Focused physical exam completed. Findings are WDL for fat depletion, muscle depletion, and edema.     Weight Change: Pt reports weight of 218lbs roughly 2 weeks ago. Per [facility] pt weight 215lbs 2 years ago. Current measured weight of 175lbs.   Skin:  Reviewed, no issues   Height:   Ht Readings from Last 1 Encounters:  09/03/15 5\' 11"  (1.803 m)     Weight:   Wt Readings from Last 1 Encounters:  09/04/15 175 lb 9.6 oz (79.652 kg)   Wt Readings from Last 10 Encounters:  09/04/15 175 lb 9.6 oz (79.652 kg)  11/06/13 215 lb 6.2 oz (97.7 kg)     BMI:  Body mass index is 24.5 kg/(m^2).   EDUCATION NEEDS:   Education needs no appropriate at this time   LOW Care Level  Leda QuailAllyson Monterrio Gerst, IowaRD, LDN Pager [phone]

## 2015-09-04 NOTE — Progress Notes (Signed)
ANTIBIOTIC CONSULT NOTE - INITIAL  Pharmacy Consult for Levaquin Indication: pneumonia  No Known Allergies  Patient Measurements: Height: 5\' 11"  (180.3 cm) Weight: 175 lb 9.6 oz (79.652 kg) IBW/kg (Calculated) : 75.3 Adjusted Body Weight: n/a  Vital Signs: Temp: 99.2 F (37.3 C) (10/14 1057) Temp Source: Oral (10/14 1057) BP: 116/70 mmHg (10/14 1057) Pulse Rate: 64 (10/14 1057) Intake/Output from previous day: 10/13 0701 - 10/14 0700 In: 240 [P.O.:240] Out: 1925 [Urine:1925] Intake/Output from this shift: Total I/O In: 320 [P.O.:320] Out: 1200 [Urine:1200]  Labs:  Recent Labs  09/03/15 1042 09/04/15 0533  WBC 13.3* 9.0  HGB 11.5* 11.8*  PLT 278 319  CREATININE 0.71 0.80   Estimated Creatinine Clearance: 132 mL/min (by C-G formula based on Cr of 0.8). No results for input(s): VANCOTROUGH, VANCOPEAK, VANCORANDOM, GENTTROUGH, GENTPEAK, GENTRANDOM, TOBRATROUGH, TOBRAPEAK, TOBRARND, AMIKACINPEAK, AMIKACINTROU, AMIKACIN in the last 72 hours.   Microbiology: Recent Results (from the past 720 hour(s))  Blood culture (routine x 2)     Status: None (Preliminary result)   Collection Time: 09/03/15  1:20 PM  Result Value Ref Range Status   Specimen Description BLOOD RIGHT ARM  Final   Special Requests BOTTLES DRAWN AEROBIC AND ANAEROBIC  5 CC  Final   Culture NO GROWTH < 24 HOURS  Final   Report Status PENDING  Incomplete  Blood culture (routine x 2)     Status: None (Preliminary result)   Collection Time: 09/03/15  1:25 PM  Result Value Ref Range Status   Specimen Description BLOOD RIGHT ARM  Final   Special Requests   Final    BOTTLES DRAWN AEROBIC AND ANAEROBIC  1 CC ANERO 3 CC AERO   Culture NO GROWTH < 24 HOURS  Final   Report Status PENDING  Incomplete  MRSA PCR Screening     Status: None   Collection Time: 09/03/15  2:28 PM  Result Value Ref Range Status   MRSA by PCR NEGATIVE NEGATIVE Final    Comment:        The GeneXpert MRSA Assay (FDA approved for  NASAL specimens only), is one component of a comprehensive MRSA colonization surveillance program. It is not intended to diagnose MRSA infection nor to guide or monitor treatment for MRSA infections.     Medical History: Past Medical History  Diagnosis Date  . Depression   . Anxiety   . Hypertension   . GERD (gastroesophageal reflux disease)     Medications:  Anti-infectives    Start     Dose/Rate Route Frequency Ordered Stop   09/04/15 1800  levofloxacin (LEVAQUIN) IVPB 750 mg     750 mg 100 mL/hr over 90 Minutes Intravenous Every 24 hours 09/04/15 1503     09/03/15 1215  levofloxacin (LEVAQUIN) IVPB 750 mg     750 mg 100 mL/hr over 90 Minutes Intravenous  Once 09/03/15 1207 09/03/15 1445     Assessment: Patient with CAP, received first dose of Levaquin 750mg  IV in the ED on 10/13. To be continued on Levaquin  Goal of Therapy:  Resolution of symptoms  Plan:  Follow up culture results Will order Levaquin 750mg  IV q24h  Clovia CuffLisa Flora Ratz, PharmD, BCPS 09/04/2015 3:06 PM

## 2015-09-04 NOTE — Progress Notes (Addendum)
[facility] [place] [facility]   PATIENT NAME: [name]    MR#:  [id number]  DATE OF BIRTH:  12-Jun-1976  SUBJECTIVE:  CHIEF COMPLAINT:   Chief Complaint  Patient presents with  . Altered Mental Status   Patient here due to altered mental status from drug overdose and noted to have an aspiration pneumonia. Still quite lethargic. Sitter at bedside.  REVIEW OF SYSTEMS:    Review of Systems  Unable to perform ROS: mental acuity    Nutrition: Soft diet Tolerating Diet: Yes Tolerating PT: Await evaluation   DRUG ALLERGIES:  No Known Allergies  VITALS:  Blood pressure 116/70, pulse 64, temperature 99.2 F (37.3 C), temperature source Oral, resp. rate 26, height 5\' 11"  (1.803 m), weight 79.652 kg (175 lb 9.6 oz), SpO2 93 %.  PHYSICAL EXAMINATION:   Physical Exam  GENERAL:  39 y.o.-year-old patient lying in the bed lethargic/encephalopathic but in no apparent distress.  EYES: Pupils equal, round, reactive to light and accommodation. No scleral icterus. Extraocular muscles intact.  HEENT: Head atraumatic, normocephalic. Oropharynx and nasopharynx clear.  NECK:  Supple, no jugular venous distention. No thyroid enlargement, no tenderness.  LUNGS: Normal breath sounds bilaterally, no wheezing, rales, rhonchi. No use of accessory muscles of respiration.  CARDIOVASCULAR: S1, S2 normal. No murmurs, rubs, or gallops.  ABDOMEN: Soft, nontender, nondistended. Bowel sounds present. No organomegaly or mass.  EXTREMITIES: No cyanosis, clubbing or edema b/l.    NEUROLOGIC: Cranial nerves II through XII are intact. No focal Motor or sensory deficits b/l.  Globally weak PSYCHIATRIC: The patient is alert and oriented x 2. Depressed SKIN: No obvious rash, lesion, or ulcer.    LABORATORY PANEL:   CBC  Recent Labs Lab 09/04/15 0533  WBC 9.0  HGB 11.8*  HCT 34.8*  PLT 319    ------------------------------------------------------------------------------------------------------------------  Chemistries   Recent Labs Lab 09/03/15 1042 09/04/15 0533  NA 137 140  K 4.7 3.9  CL 105 103  CO2 27 31  GLUCOSE 82 89  BUN 6 5*  CREATININE 0.71 0.80  CALCIUM 8.3* 9.1  AST 26  --   ALT 28  --   ALKPHOS 80  --   BILITOT 0.5  --    ------------------------------------------------------------------------------------------------------------------  Cardiac Enzymes  Recent Labs Lab 09/03/15 2041  TROPONINI <0.03   ------------------------------------------------------------------------------------------------------------------  RADIOLOGY:  Ct Head Wo Contrast  09/03/2015  CLINICAL DATA:  Altered mental status. EXAM: CT HEAD WITHOUT CONTRAST TECHNIQUE: Contiguous axial images were obtained from the base of the skull through the vertex without intravenous contrast. COMPARISON:  06/19/2012 FINDINGS: There is no evidence of mass effect, midline shift or extra-axial fluid collections. There is no evidence of a space-occupying lesion or intracranial hemorrhage. There is no evidence of a cortical-based area of acute infarction. The ventricles and sulci are appropriate for the patient's age. The basal cisterns are patent. Visualized portions of the orbits are unremarkable. The visualized portions of the paranasal sinuses and mastoid air cells are unremarkable. The osseous structures are unremarkable. IMPRESSION: No acute intracranial pathology. Electronically Signed   By: Elige KoHetal  Patel   On: 09/03/2015 11:13   Dg Chest Portable 1 View  09/03/2015  CLINICAL DATA:  Altered mental status EXAM: PORTABLE CHEST 1 VIEW COMPARISON:  11/24/2013 FINDINGS: Cardiomediastinal silhouette is stable. There is infiltrate/pneumonia in right lower lobe laterally. No pulmonary edema. Left lung is clear. IMPRESSION: Infiltrate/ pneumonia in right lower lobe lateral segment. No pulmonary edema.  Electronically Signed  By: Natasha Mead M.D.   On: 09/03/2015 11:04     ASSESSMENT AND PLAN:   39 year old male with past history of depression and previous suicide attempts, diabetes, GERD, anxiety who presented to the hospital due to altered mental status and also noted to be hypoxic.  #1 pneumonia-this is likely aspiration pneumonia as a result of his lethargy and altered mental status. -Continue IV Levaquin. Follow blood and sputum cultures and follow clinically. -She is afebrile and hemodynamically stable.  #2 altered mental status-this is likely metabolic encephalopathy from drug overdose. -Patient says he took an increasing amount of Benadryl but his urine toxicology is positive for tricyclics and also benzodiazepines. -Continue supportive care and follow mental status.  #3 history of depression with previous suicide attempts-patient is involuntarily committed. Continue one-to-one sitter. -Appreciate psychiatric consult. We'll await further input from psychiatrist to see if he needs inpatient care once his lethargy and mental status improves. - cont. Depakote, Lexapro.   #4 anxiety-continue Klonopin.    All the records are reviewed and case discussed with Care Management/Social Workerr. Management plans discussed with the patient, family and they are in agreement.  CODE STATUS: Full  DVT Prophylaxis: Heparin subcutaneous  TOTAL TIME TAKING CARE OF THIS PATIENT: 30 minutes.   POSSIBLE D/C IN 1-2 DAYS, DEPENDING ON CLINICAL CONDITION.   Houston Siren M.D on 09/04/2015 at 4:13 PM  Between 7am to 6pm - Pager - [phone]  After 6pm go to [email or web address] - password [email or web address]  [facility]  Office  [phone]  CC: Primary care physician; Pcp Not In System

## 2015-09-04 NOTE — Consult Note (Signed)
[place] [facility] Consult   Reason for Consult:  Follow-up consult for this 39 year old man with a history of suicide attempts depression and personality disorder who came into the hospital after a overdose Referring Physician:  Verdell Carmine Patient Identification: [name] MRN:  [id number] Principal Diagnosis: Overdose Diagnosis:   Patient Active Problem List   Diagnosis Date Noted  . Acute respiratory failure with hypoxia ([place]) [J96.01] 09/03/2015  . Overdose [T50.901A] 09/03/2015  . Depression [F32.9] 09/03/2015  . Suicide attempt by drug ingestion ([place]) [T50.902A] 11/05/2013  . Insulin overdose [T38.3X1A] 11/05/2013  . Alcohol abuse [F10.10] 11/05/2013  . Seizure disorder ([place]) [R42.706] 11/05/2013  . Hypophosphatemia [E83.39] 11/05/2013  . Tobacco abuse [Z72.0] 11/05/2013  . Situational depression [F43.21] 11/05/2013  . Rib pain [R07.81] 11/05/2013  . HTN (hypertension) [I10] 11/05/2013  . Hypoglycemia [E16.2] 11/05/2013    Total Time spent with patient: 30 minutes  Subjective:   [name] is a 39 y.o. male patient admitted with patient was admitted after an overdose of medication which he states was Benadryl. Possibly also involved clonazepam or other benzodiazepine as he is prescribed clonazepam at home. Concern about suicidality and dangerousness.  HPI:  On interview today the patient says he is mostly feeling weak and tired. He says his mood is okay and he says he is not having active thoughts about killing himself. Denies any hallucinations. Patient is not a very convincing historian. He is very sedated still and speaks very little and looks like he is still a little oversedated. Patient has not made any attempt to harm himself since being in the hospital. He has been cooperative with medical treatment. 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 He has had a history of over 30 overdose attempts in the past with multiple statements of suicidal intent although a lot of the behavior really did not seem like he was aiming at harming himself. Somewhat complex past history. Had been relatively stable since living in a group home  Risk to Self: Is patient at risk for suicide?: No Risk to Others:   Prior Inpatient Therapy:   Prior Outpatient Therapy:    Past Medical History:  Past Medical History  Diagnosis Date  . Depression   . Anxiety   . Hypertension   . GERD (gastroesophageal reflux disease)    History reviewed. No pertinent past surgical history. Family History:  Family History  Problem Relation Age of Onset  . CAD Mother   . Diabetes Mellitus II Father    Family Psychiatric  History: Patient does not report any family history of mental illness Social History:  History  Alcohol Use No    Comment: pt states he no longer drinks     History  Drug Use No    Comment: had ecstacy in urine but deneis    Social History   Social History  . Marital Status: Single    Spouse Name: N/A  . Number of Children: N/A  . Years of Education: N/A   Social History Main Topics  . Smoking status: Current Every Day Smoker  . Smokeless tobacco: None  . Alcohol Use: No     Comment: pt states he no longer drinks  . Drug Use: No     Comment: had ecstacy in urine but deneis  . Sexual Activity: Not Asked   Other Topics Concern  . None   Social  History Narrative   Additional Social History:                          Allergies:  No Known Allergies  Labs:  Results for orders placed or performed during the hospital encounter of 09/03/15 (from the past 48 hour(s))  Glucose, capillary     Status: None   Collection Time: 09/03/15 10:30 AM  Result Value Ref Range   Glucose-Capillary 87 65 - 99 mg/dL  Comprehensive metabolic panel     Status: Abnormal   Collection Time: 09/03/15 10:42 AM  Result Value Ref Range    Sodium 137 135 - 145 mmol/L   Potassium 4.7 3.5 - 5.1 mmol/L    Comment: HEMOLYSIS AT THIS LEVEL MAY AFFECT RESULT   Chloride 105 101 - 111 mmol/L   CO2 27 22 - 32 mmol/L   Glucose, Bld 82 65 - 99 mg/dL   BUN 6 6 - 20 mg/dL   Creatinine, Ser 0.71 0.61 - 1.24 mg/dL   Calcium 8.3 (L) 8.9 - 10.3 mg/dL   Total Protein 6.7 6.5 - 8.1 g/dL   Albumin 2.9 (L) 3.5 - 5.0 g/dL   AST 26 15 - 41 U/L   ALT 28 17 - 63 U/L   Alkaline Phosphatase 80 38 - 126 U/L   Total Bilirubin 0.5 0.3 - 1.2 mg/dL   GFR calc non Af Amer >60 >60 mL/min   GFR calc Af Amer >60 >60 mL/min    Comment: (NOTE) The eGFR has been calculated using the CKD EPI equation. This calculation has not been validated in all clinical situations. eGFR's persistently <60 mL/min signify possible Chronic Kidney Disease.    Anion gap 5 5 - 15  Ethanol     Status: None   Collection Time: 09/03/15 10:42 AM  Result Value Ref Range   Alcohol, Ethyl (B) <5 <5 mg/dL    Comment:        LOWEST DETECTABLE LIMIT FOR SERUM ALCOHOL IS 5 mg/dL FOR MEDICAL PURPOSES ONLY   Troponin I     Status: None   Collection Time: 09/03/15 10:42 AM  Result Value Ref Range   Troponin I <0.03 <0.031 ng/mL    Comment:        NO INDICATION OF MYOCARDIAL INJURY.   CBC with Differential     Status: Abnormal   Collection Time: 09/03/15 10:42 AM  Result Value Ref Range   WBC 13.3 (H) 3.8 - 10.6 K/uL   RBC 3.59 (L) 4.40 - 5.90 MIL/uL   Hemoglobin 11.5 (L) 13.0 - 18.0 g/dL   HCT 33.6 (L) 40.0 - 52.0 %   MCV 93.6 80.0 - 100.0 fL   MCH 31.9 26.0 - 34.0 pg   MCHC 34.1 32.0 - 36.0 g/dL   RDW 13.6 11.5 - 14.5 %   Platelets 278 150 - 440 K/uL    Comment: PLATELET CLUMPS NOTED ON SMEAR, COUNT APPEARS ADEQUATE   Neutrophils Relative % 75 %   Lymphocytes Relative 12 %   Monocytes Relative 13 %   Eosinophils Relative 0 %   Basophils Relative 0 %   Band Neutrophils 0 %   Metamyelocytes Relative 0 %   Myelocytes 0 %   Promyelocytes Absolute 0 %   Blasts 0 %    nRBC 0 0 /100 WBC   Other 0 %   Neutro Abs 10.0 (H) 1.4 - 6.5 K/uL   Lymphs Abs 1.6 1.0 - 3.6 K/uL  Monocytes Absolute 1.7 (H) 0.2 - 1.0 K/uL   Eosinophils Absolute 0.0 0 - 0.7 K/uL   Basophils Absolute 0.0 0 - 0.1 K/uL   Smear Review MORPHOLOGY UNREMARKABLE   Acetaminophen level     Status: Abnormal   Collection Time: 09/03/15 10:42 AM  Result Value Ref Range   Acetaminophen (Tylenol), Serum <10 (L) 10 - 30 ug/mL    Comment:        THERAPEUTIC CONCENTRATIONS VARY SIGNIFICANTLY. A RANGE OF 10-30 ug/mL MAY BE AN EFFECTIVE CONCENTRATION FOR MANY PATIENTS. HOWEVER, SOME ARE BEST TREATED AT CONCENTRATIONS OUTSIDE THIS RANGE. ACETAMINOPHEN CONCENTRATIONS >150 ug/mL AT 4 HOURS AFTER INGESTION AND >50 ug/mL AT 12 HOURS AFTER INGESTION ARE OFTEN ASSOCIATED WITH TOXIC REACTIONS.   Salicylate level     Status: None   Collection Time: 09/03/15 10:42 AM  Result Value Ref Range   Salicylate Lvl <4.5 2.8 - 30.0 mg/dL  Valproic acid level     Status: None   Collection Time: 09/03/15 10:42 AM  Result Value Ref Range   Valproic Acid Lvl 65 50.0 - 100.0 ug/mL  Prolactin     Status: Abnormal   Collection Time: 09/03/15 10:42 AM  Result Value Ref Range   Prolactin 31.4 (H) 4.0 - 15.2 ng/mL    Comment: (NOTE) Performed At: [facility] [address] [place], [place] [id number] Lindon Romp MD LK:[id number]   Urinalysis complete, with microscopic     Status: Abnormal   Collection Time: 09/03/15 10:43 AM  Result Value Ref Range   Color, Urine YELLOW (A) YELLOW   APPearance CLEAR (A) CLEAR   Glucose, UA NEGATIVE NEGATIVE mg/dL   Bilirubin Urine NEGATIVE NEGATIVE   Ketones, ur NEGATIVE NEGATIVE mg/dL   Specific Gravity, Urine 1.014 1.005 - 1.030   Hgb urine dipstick NEGATIVE NEGATIVE   pH 6.0 5.0 - 8.0   Protein, ur NEGATIVE NEGATIVE mg/dL   Nitrite NEGATIVE NEGATIVE   Leukocytes, UA NEGATIVE NEGATIVE   RBC / HPF 0-5 0 - 5 RBC/hpf   WBC, UA 0-5 0 - 5 WBC/hpf    Bacteria, UA NONE SEEN NONE SEEN   Squamous Epithelial / LPF 0-5 (A) NONE SEEN   Mucous PRESENT   Urine Drug Screen, Qualitative     Status: Abnormal   Collection Time: 09/03/15 10:43 AM  Result Value Ref Range   Tricyclic, Ur Screen POSITIVE (A) NONE DETECTED   Amphetamines, Ur Screen NONE DETECTED NONE DETECTED   MDMA (Ecstasy)Ur Screen NONE DETECTED NONE DETECTED   Cocaine Metabolite,Ur [place] NONE DETECTED NONE DETECTED   Opiate, Ur Screen NONE DETECTED NONE DETECTED   Phencyclidine (PCP) Ur S NONE DETECTED NONE DETECTED   Cannabinoid 50 Ng, Ur [place] NONE DETECTED NONE DETECTED   Barbiturates, Ur Screen NONE DETECTED NONE DETECTED   Benzodiazepine, Ur Scrn POSITIVE (A) NONE DETECTED   Methadone Scn, Ur NONE DETECTED NONE DETECTED    Comment: (NOTE) 794  Tricyclics, urine               Cutoff 1000 ng/mL 200  Amphetamines, urine             Cutoff 1000 ng/mL 300  MDMA (Ecstasy), urine           Cutoff 500 ng/mL 400  Cocaine Metabolite, urine       Cutoff 300 ng/mL 500  Opiate, urine                   Cutoff 300 ng/mL 600  Phencyclidine (PCP), urine      Cutoff 25 ng/mL 700  Cannabinoid, urine              Cutoff 50 ng/mL 800  Barbiturates, urine             Cutoff 200 ng/mL 900  Benzodiazepine, urine           Cutoff 200 ng/mL 1000 Methadone, urine                Cutoff 300 ng/mL 1100 1200 The urine drug screen provides only a preliminary, unconfirmed 1300 analytical test result and should not be used for non-medical 1400 purposes. Clinical consideration and professional judgment should 1500 be applied to any positive drug screen result due to possible 1600 interfering substances. A more specific alternate chemical method 1700 must be used in order to obtain a confirmed analytical result.  1800 Gas chromato graphy / mass spectrometry (GC/MS) is the preferred 1900 confirmatory method.   Blood gas, arterial     Status: Abnormal   Collection Time: 09/03/15  1:00 PM  Result Value Ref  Range   FIO2 0.21    pH, Arterial 7.49 (H) 7.350 - 7.450   pCO2 arterial 40 32.0 - 48.0 mmHg   pO2, Arterial 79 (L) 83.0 - 108.0 mmHg   Bicarbonate 30.5 (H) 21.0 - 28.0 mEq/L   Acid-Base Excess 6.6 (H) 0.0 - 3.0 mmol/L   O2 Saturation 96.5 %   Patient temperature 37.0    Collection site RIGHT BRACHIAL    Sample type ARTERIAL DRAW    Allens test (pass/fail) POSITIVE (A) PASS  Blood culture (routine x 2)     Status: None (Preliminary result)   Collection Time: 09/03/15  1:20 PM  Result Value Ref Range   Specimen Description BLOOD RIGHT ARM    Special Requests BOTTLES DRAWN AEROBIC AND ANAEROBIC  5 CC    Culture NO GROWTH < 24 HOURS    Report Status PENDING   Blood culture (routine x 2)     Status: None (Preliminary result)   Collection Time: 09/03/15  1:25 PM  Result Value Ref Range   Specimen Description BLOOD RIGHT ARM    Special Requests      BOTTLES DRAWN AEROBIC AND ANAEROBIC  1 CC ANERO 3 CC AERO   Culture NO GROWTH < 24 HOURS    Report Status PENDING   Glucose, capillary     Status: None   Collection Time: 09/03/15  1:49 PM  Result Value Ref Range   Glucose-Capillary 91 65 - 99 mg/dL  MRSA PCR Screening     Status: None   Collection Time: 09/03/15  2:28 PM  Result Value Ref Range   MRSA by PCR NEGATIVE NEGATIVE    Comment:        The GeneXpert MRSA Assay (FDA approved for NASAL specimens only), is one component of a comprehensive MRSA colonization surveillance program. It is not intended to diagnose MRSA infection nor to guide or monitor treatment for MRSA infections.   Glucose, capillary     Status: None   Collection Time: 09/03/15  2:55 PM  Result Value Ref Range   Glucose-Capillary 71 65 - 99 mg/dL  Troponin I     Status: None   Collection Time: 09/03/15  3:36 PM  Result Value Ref Range   Troponin I <0.03 <0.031 ng/mL    Comment:        NO INDICATION OF MYOCARDIAL INJURY.   Troponin I  Status: None   Collection Time: 09/03/15  8:41 PM  Result  Value Ref Range   Troponin I <0.03 <0.031 ng/mL    Comment:        NO INDICATION OF MYOCARDIAL INJURY.   Basic metabolic panel     Status: Abnormal   Collection Time: 09/04/15  5:33 AM  Result Value Ref Range   Sodium 140 135 - 145 mmol/L   Potassium 3.9 3.5 - 5.1 mmol/L   Chloride 103 101 - 111 mmol/L   CO2 31 22 - 32 mmol/L   Glucose, Bld 89 65 - 99 mg/dL   BUN 5 (L) 6 - 20 mg/dL   Creatinine, Ser 0.80 0.61 - 1.24 mg/dL   Calcium 9.1 8.9 - 10.3 mg/dL   GFR calc non Af Amer >60 >60 mL/min   GFR calc Af Amer >60 >60 mL/min    Comment: (NOTE) The eGFR has been calculated using the CKD EPI equation. This calculation has not been validated in all clinical situations. eGFR's persistently <60 mL/min signify possible Chronic Kidney Disease.    Anion gap 6 5 - 15  CBC     Status: Abnormal   Collection Time: 09/04/15  5:33 AM  Result Value Ref Range   WBC 9.0 3.8 - 10.6 K/uL   RBC 3.69 (L) 4.40 - 5.90 MIL/uL   Hemoglobin 11.8 (L) 13.0 - 18.0 g/dL   HCT 34.8 (L) 40.0 - 52.0 %   MCV 94.3 80.0 - 100.0 fL   MCH 31.9 26.0 - 34.0 pg   MCHC 33.9 32.0 - 36.0 g/dL   RDW 13.7 11.5 - 14.5 %   Platelets 319 150 - 440 K/uL    Current Facility-Administered Medications  Medication Dose Route Frequency Provider Last Rate Last Dose  . acetaminophen (TYLENOL) tablet 650 mg  650 mg Oral Q4H PRN Fritzi Mandes, MD   650 mg at 09/03/15 2125  . clonazePAM (KLONOPIN) tablet 1 mg  1 mg Oral Daily Gonzella Lex, MD   1 mg at 09/04/15 1142  . clonazePAM (KLONOPIN) tablet 2 mg  2 mg Oral QHS Gonzella Lex, MD   2 mg at 09/03/15 2110  . divalproex (DEPAKOTE ER) 24 hr tablet 2,000 mg  2,000 mg Oral QHS Gonzella Lex, MD   2,000 mg at 09/03/15 2110  . escitalopram (LEXAPRO) tablet 20 mg  20 mg Oral Daily Gonzella Lex, MD   20 mg at 09/04/15 1143  . heparin injection 5,000 Units  5,000 Units Subcutaneous 3 times per day Aldean Jewett, MD   5,000 Units at 09/04/15 1510  . Influenza vac split  quadrivalent PF (FLUARIX) injection 0.5 mL  0.5 mL Intramuscular Tomorrow-1000 Aldean Jewett, MD      . levofloxacin (LEVAQUIN) IVPB 750 mg  750 mg Intravenous Q24H Vira Blanco, RPH      . pneumococcal 23 valent vaccine (PNU-IMMUNE) injection 0.5 mL  0.5 mL Intramuscular Tomorrow-1000 Aldean Jewett, MD      . sodium chloride 0.9 % injection 3 mL  3 mL Intravenous PRN Aldean Jewett, MD        Musculoskeletal: Strength & Muscle Tone: decreased Gait & Station: unable to stand Patient leans: N/A  Psychiatric Specialty Exam: Review of Systems  HENT: Negative.   Eyes: Negative.   Respiratory: Negative.   Cardiovascular: Negative.   Gastrointestinal: Negative.   Musculoskeletal: Negative.   Skin: Negative.   Neurological: Positive for weakness.  Psychiatric/Behavioral: Positive for memory  loss. Negative for depression, suicidal ideas, hallucinations and substance abuse. The patient is not nervous/anxious and does not have insomnia.     Blood pressure 116/70, pulse 64, temperature 99.2 F (37.3 C), temperature source Oral, resp. rate 26, height _0  (1.803 m), weight 79.652 kg (175 lb 9.6 oz), SpO2 93 %.Body mass index is 24.5 kg/(m^2).  General Appearance: Disheveled  Eye Contact::  Minimal  Speech:  Slow  Volume:  Decreased  Mood:  Euthymic  Affect:  Flat  Thought Process:  Minimal. Answers questions in only a few words at most  Orientation:  Full (Time, Place, and Person)  Thought Content:  Negative  Suicidal Thoughts:  No  Homicidal Thoughts:  No  Memory:  Immediate;   Fair Recent;   Fair Remote;   Fair  Judgement:  Impaired  Insight:  Shallow  Psychomotor Activity:  Decreased  Concentration:  Fair  Recall:  Poor  Fund of Knowledge:Fair  Language: Fair  Akathisia:  No  Handed:  Right  AIMS (if indicated):     Assets:  Financial Resources/Insurance Housing Resilience  ADL's:  Impaired  Cognition: Impaired,  Mild  Sleep:      Treatment Plan  Summary: Medication management and Plan Patient continues to be very sedated today. Although he told me that he was not having suicidal thoughts I found his interview unconvincing because of how sedated he still seems to be. Case reviewed with the hospitalist on duty. I am going to leave the patient on commitment for now. I recommend that he be reevaluated over the weekend by the psychiatrist on call who should be Dr. Ellery Plunk. If Dr. Ellery Plunk sees fit he can take the patient off commitment at that point. If he is still in the hospital after the weekend I will follow-up with the patient at that point. No change to medicine management.  Disposition: Supportive therapy provided about ongoing stressors.  John Clapacs 09/04/2015 4:26 PM

## 2015-09-04 NOTE — Progress Notes (Signed)
Patient is from a group home.  Clinical Social Worker in to assess patient for ongoing and disposition needs.  Patient has a Comptrollersitter at bedside, he is lethargic.  Unable to awake.  CSW will return at later time.  [name]. Theresia MajorsLCSWA, MSW Clinical Social Work Department [phone] 12:18 PM

## 2015-09-04 NOTE — Clinical Social Work Note (Signed)
Clinical Social Work Assessment  Patient Details  Name: [name] MRN: [id number] Date of Birth: 09/19/1976  Date of referral:  09/04/15               Reason for consult:  Suicide Risk/Attempt ( from group home)                Permission sought to share information with:  Facility Medical sales representative, Guardian Permission granted to share information::  Yes, Verbal Permission Granted  Name::      (DSS guardian and group home)  Agency::     Relationship::     Contact Information:     Housing/Transportation Living arrangements for the past 2 months:  Group Home Source of Information:  Patient (group home owner) Patient Interpreter Needed:  None Criminal Activity/Legal Involvement Pertinent to Current Situation/Hospitalization:  No - Comment as needed Significant Relationships:  Mental Health Provider (roommate at group home) Lives with:  Facility Resident Do you feel safe going back to the place where you live?  Yes Need for family participation in patient care:  Yes (Comment) (Guardian)  Care giving concerns:  Group home is concerned that patient maybe manipulative with his ACT team. Concern with patient walking to store to purchase medication.     Social Worker assessment / plan:  Visual merchandiser in to assess patient for ongoing and disposition needs.  Patient is lethargic but engaged in conversation.  Patient currently lives in [facility] group home where he has lived since Aug. 2016 however has lived in group homes for about two years.  Patient states he took pills but at the time he was trying to kill himself however he no longer wants to kill himself.  Patient denies SI and HI.  Per patient he has an ACT team, however he was unable to remember the team or contact information.  Patient states he smokes about a pack of cigarettes every 3 days.  Patient states has no family as his support system. States gets along well with his roommate at the group home.   His  guardian is Saintclair Halsted with [facility] DSS  [phone].  Per patient his last drink was about two years ago denies using drugs.  Patient denies any stressors, no new or change in medications. Call to guardian left message to call CSW,   Call to group home owner Deloris [phone], patient is able to return to group home.  States patient recently spent several months at [facility], has been back from [facility] about two weeks. States no one in the group home takes Benadryl, patient informed group home owner he walked to the store and purchased the Benadryl and took it.    Call to ACT team with [facility] [phone], currently meeting with patient 2 to 3 times a week. Asked that any medication changed be faxed to them. Fax # [phone].    Employment status:  Disabled (Comment on whether or not currently receiving Disability) Insurance information:  Medicare, Medicaid [employer] PT Recommendations:  Not assessed at this time Information / Referral to community resources:   (follow up with ACT team)  Patient/Family's Response to care:  Patient is in agreement with plan  Patient/Family's Understanding of and Emotional Response to Diagnosis, Current Treatment, and Prognosis: Patient understands he is under continued medical workup and will be evaluated by Psych MD. Once clear with medical and psych MD he plans to return to his group home.   Emotional Assessment Appearance:  Appears older than stated age Attitude/Demeanor/Rapport:  Lethargic Affect (typically observed):  Calm, Flat Orientation:  Oriented to Self, Oriented to Place, Oriented to  Time, Oriented to Situation Alcohol / Substance use:  Tobacco Use Psych involvement (Current and /or in the community):  Outpatient Provider ([facility]  [phone]  Discharge Needs  Concerns to be addressed:  Mental Health Concerns Readmission within the last 30 days:  No Current discharge risk:  Psychiatric  Illness Barriers to Discharge:  Continued Medical Work up (psych to evaluate)   Soundra PilonMoore, Arien Morine H, LCSW 09/04/2015, 3:41 PM

## 2015-09-05 DIAGNOSIS — T50902A Poisoning by unspecified drugs, medicaments and biological substances, intentional self-harm, initial encounter: Secondary | ICD-10-CM | POA: Diagnosis not present

## 2015-09-05 LAB — CBC
HEMATOCRIT: 35.8 % — AB (ref 40.0–52.0)
HEMOGLOBIN: 12.2 g/dL — AB (ref 13.0–18.0)
MCH: 31.8 pg (ref 26.0–34.0)
MCHC: 34.2 g/dL (ref 32.0–36.0)
MCV: 93 fL (ref 80.0–100.0)
Platelets: 313 10*3/uL (ref 150–440)
RBC: 3.85 MIL/uL — ABNORMAL LOW (ref 4.40–5.90)
RDW: 13.6 % (ref 11.5–14.5)
WBC: 13 10*3/uL — ABNORMAL HIGH (ref 3.8–10.6)

## 2015-09-05 MED ORDER — LEVOFLOXACIN 750 MG PO TABS
750.0000 mg | ORAL_TABLET | Freq: Every day | ORAL | Status: DC
  Administered 2015-09-05 – 2015-09-06 (×2): 750 mg via ORAL
  Filled 2015-09-05 (×2): qty 1

## 2015-09-05 MED ORDER — LEVOFLOXACIN 750 MG PO TABS: 750.0000 mg | ORAL_TABLET | Freq: Every day | ORAL | Status: DC

## 2015-09-05 NOTE — Discharge Summary (Addendum)
[facility] - [place] at [facility]   PATIENT NAME: [name]    MR#:  [id number]  DATE OF BIRTH:  [birth year]  DATE OF ADMISSION:  09/03/2015 ADMITTING PHYSICIAN: Gale Journey, MD  DATE OF DISCHARGE: 09/06/15  PRIMARY CARE PHYSICIAN: Pcp Not In System     ADMISSION DIAGNOSIS:  Community acquired pneumonia [J18.9] Altered mental status, unspecified altered mental status type [R41.82]  DISCHARGE DIAGNOSIS:  Principal Problem:   Overdose Active Problems:   Suicide attempt by drug ingestion (HCC)   Acute respiratory failure with hypoxia (HCC)   Aspiration pneumonia (HCC)   Altered mental status   Depression   SECONDARY DIAGNOSIS:   Past Medical History  Diagnosis Date  . Depression   . Anxiety   . Hypertension   . GERD (gastroesophageal reflux disease)     .pro HOSPITAL COURSE:   Patient is a 39 year old Caucasian male with history of anxiety, depression and multiple suicidal attempts with insulin, who presents to the hospital from assisted living facility due to altered mental status. Urine drug screen was positive for benzodiazepines and tricyclics. Patient's chest x-ray revealed right lower lobe infiltrates/pneumonia. Patient was initiated on antibiotic therapy and further conservative therapy his condition improved . Hewas seen by psychiatrist who recommended to continue involuntary commitment while patient was in the hospital. Psychiatrist is following the patient and will  determine if patient is appropriate for behavioral medicine unit admission Discussion by problem #1 pneumonia-this is likely aspiration pneumonia as a result of his lethargy and altered mental status.The patient is to continue Levaquin for 5 more days. Negative blood cultures and not obtained sputum cultures , stable clinically. The patient is afebrile and hemodynamically stable. Patient is to continue antibiotics for 5 days to complete course, he is to follow-up  with his primary care physician as outpatient within 1 week after discharge  #2 altered mental status-this is likely metabolic encephalopathy from drug overdose, intentional versus accidental -Patient says he took an increasing amount of Benadryl and his urine toxicology is positive for tricyclics and also benzodiazepines. Patient was continued on supportive care and mental status improved.  #3 history of depression with previous suicide attempts-patient is involuntarily committed. Continue one-to-one sitter. -Appreciate psychiatric consult. We'll await further input from psychiatrist to see if he needs inpatient care once his lethargy and mental status improves. - cont. Depakote, Lexapro.   #4 anxiety-continue Klonopin DISCHARGE CONDITIONS:   Stable  CONSULTS OBTAINED:  Treatment Team:  Audery Amel, MD  DRUG ALLERGIES:  No Known Allergies  DISCHARGE MEDICATIONS:   Current Discharge Medication List    START taking these medications   Details  levofloxacin (LEVAQUIN) 750 MG tablet Take 1 tablet (750 mg total) by mouth daily. Qty: 5 tablet, Refills: 0      CONTINUE these medications which have NOT CHANGED   Details  amLODipine (NORVASC) 2.5 MG tablet Take 2.5 mg by mouth daily.    buPROPion (WELLBUTRIN XL) 300 MG 24 hr tablet Take 300 mg by mouth daily.    clonazePAM (KLONOPIN) 1 MG tablet Take 1-2 mg by mouth See admin instructions. 1 tablet every morning, 2 tablets every afternoon, and 2 tablets at bedtime    divalproex (DEPAKOTE ER) 500 MG 24 hr tablet Take 2,000 mg by mouth at bedtime.    escitalopram (LEXAPRO) 20 MG tablet Take 20 mg by mouth every morning.    esomeprazole (NEXIUM) 40 MG capsule Take 40 mg by mouth daily.     hydrOXYzine (  ATARAX/VISTARIL) 50 MG tablet Take 50 mg by mouth 3 (three) times daily as needed for anxiety.    Melatonin 5 MG TABS Take 10 mg by mouth at bedtime.    meloxicam (MOBIC) 15 MG tablet Take 15 mg by mouth daily.    Multiple  Vitamins-Minerals (THEREMS-M) TABS Take 1 tablet by mouth daily.    traZODone (DESYREL) 100 MG tablet Take 300 mg by mouth at bedtime.         DISCHARGE INSTRUCTIONS:    Patient is to follow-up with primary care physician within one week after discharge  If you experience worsening of your admission symptoms, develop shortness of breath, life threatening emergency, suicidal or homicidal thoughts you must seek medical attention immediately by calling 911 or calling your MD immediately  if symptoms less severe.  You Must read complete instructions/literature along with all the possible adverse reactions/side effects for all the Medicines you take and that have been prescribed to you. Take any new Medicines after you have completely understood and accept all the possible adverse reactions/side effects.   Please note  You were cared for by a hospitalist during your hospital stay. If you have any questions about your discharge medications or the care you received while you were in the hospital after you are discharged, you can call the unit and asked to speak with the hospitalist on call if the hospitalist that took care of you is not available. Once you are discharged, your primary care physician will handle any further medical issues. Please note that NO REFILLS for any discharge medications will be authorized once you are discharged, as it is imperative that you return to your primary care physician (or establish a relationship with a primary care physician if you do not have one) for your aftercare needs so that they can reassess your need for medications and monitor your lab values.    Today   CHIEF COMPLAINT:   Chief Complaint  Patient presents with  . Altered Mental Status    HISTORY OF PRESENT ILLNESS:  [name]  is a 39 y.o. male with a known history of anxiety, depression and multiple suicidal attempts with insulin, who presents to the hospital from assisted living facility  due to altered mental status. Urine drug screen was positive for benzodiazepines and tricyclics. Patient's chest x-ray revealed right lower lobe infiltrates/pneumonia. Patient was initiated on antibiotic therapy and further conservative therapy his condition improved . Hewas seen by psychiatrist who recommended to continue involuntary commitment while patient was in the hospital. Psychiatrist is following the patient and will  determine if patient is appropriate for behavioral medicine unit admission Discussion by problem #1 pneumonia-this is likely aspiration pneumonia as a result of his lethargy and altered mental status. Change IV Levaquin to oral route. Negative blood cultures and not obtained sputum cultures , llow clinically. The patient is afebrile and hemodynamically stable. Patient is to continue antibiotics for 5 days to complete course, he is to follow-up with his primary care physician as outpatient within 1 week after discharge  #2 altered mental status-this is likely metabolic encephalopathy from drug overdose, intentional versus accidental -Patient says he took an increasing amount of Benadryl and his urine toxicology is positive for tricyclics and also benzodiazepines. Patient was continued on supportive care and mental status improved.  #3 history of depression with previous suicide attempts-patient was NOT involuntarily committed, but was Continue on one-to-one sitter. psychiatric saw patient and felt he was stable psychiatrically to be discharged to  group home with psychiatric service ACTT follow up. . - cont. Depakote, Lexapro, no changes were made in his medication management.   #4 anxiety-continue Klonopin, no changes    VITAL SIGNS:  Blood pressure 116/66, pulse 78, temperature 98.7 F (37.1 C), temperature source Oral, resp. rate 20, height  (1.803 m), weight 79.652 kg (175 lb 9.6 oz), SpO2 94 %.  I/O:   Intake/Output Summary (Last 24 hours) at 09/05/15 1443 Last  data filed at 09/05/15 1301  Gross per 24 hour  Intake    720 ml  Output   1525 ml  Net   -805 ml    PHYSICAL EXAMINATION:  GENERAL:  39 y.o.-year-old patient lying in the bed with no acute distress.  EYES: Pupils equal, round, reactive to light and accommodation. No scleral icterus. Extraocular muscles intact.  HEENT: Head atraumatic, normocephalic. Oropharynx and nasopharynx clear.  NECK:  Supple, no jugular venous distention. No thyroid enlargement, no tenderness.  LUNGS: Normal breath sounds bilaterally, no wheezing, rales,rhonchi or crepitation. No use of accessory muscles of respiration.  CARDIOVASCULAR: S1, S2 normal. No murmurs, rubs, or gallops.  ABDOMEN: Soft, non-tender, non-distended. Bowel sounds present. No organomegaly or mass.  EXTREMITIES: No pedal edema, cyanosis, or clubbing.  NEUROLOGIC: Cranial nerves II through XII are intact. Muscle strength 5/5 in all extremities. Sensation intact. Gait not checked.  PSYCHIATRIC: The patient is alert and oriented x 3.  SKIN: No obvious rash, lesion, or ulcer.   DATA REVIEW:   CBC  Recent Labs Lab 09/05/15 0515  WBC 13.0*  HGB 12.2*  HCT 35.8*  PLT 313    Chemistries   Recent Labs Lab 09/03/15 1042 09/04/15 0533  NA 137 140  K 4.7 3.9  CL 105 103  CO2 27 31  GLUCOSE 82 89  BUN 6 5*  CREATININE 0.71 0.80  CALCIUM 8.3* 9.1  AST 26  --   ALT 28  --   ALKPHOS 80  --   BILITOT 0.5  --     Cardiac Enzymes  Recent Labs Lab 09/03/15 2041  TROPONINI <0.03    Microbiology Results  Results for orders placed or performed during the hospital encounter of 09/03/15  Blood culture (routine x 2)     Status: None (Preliminary result)   Collection Time: 09/03/15  1:20 PM  Result Value Ref Range Status   Specimen Description BLOOD RIGHT ARM  Final   Special Requests BOTTLES DRAWN AEROBIC AND ANAEROBIC  5 CC  Final   Culture NO GROWTH 2 DAYS  Final   Report Status PENDING  Incomplete  Blood culture (routine x 2)      Status: None (Preliminary result)   Collection Time: 09/03/15  1:25 PM  Result Value Ref Range Status   Specimen Description BLOOD RIGHT ARM  Final   Special Requests   Final    BOTTLES DRAWN AEROBIC AND ANAEROBIC  1 CC ANERO 3 CC AERO   Culture NO GROWTH 2 DAYS  Final   Report Status PENDING  Incomplete  MRSA PCR Screening     Status: None   Collection Time: 09/03/15  2:28 PM  Result Value Ref Range Status   MRSA by PCR NEGATIVE NEGATIVE Final    Comment:        The GeneXpert MRSA Assay (FDA approved for NASAL specimens only), is one component of a comprehensive MRSA colonization surveillance program. It is not intended to diagnose MRSA infection nor to guide or monitor treatment for MRSA infections.  RADIOLOGY:  No results found.  EKG:   Orders placed or performed in visit on 07/12/14  . EKG 12-Lead      Management plans discussed with the patient, family and they are in agreement.  CODE STATUS:     Code Status Orders        Start     Ordered   09/03/15 1428  Full code   Continuous     09/03/15 1427      TOTAL TIME TAKING CARE OF THIS PATIENT: 40 minutes.    Katharina Caper M.D on 09/05/2015 at 2:43 PM  Between 7am to 6pm - Pager - [phone]  After 6pm go to [email or web address] - password [email or web address]  [facility]  Office  [phone]  CC: Primary care physician; Pcp Not In System

## 2015-09-05 NOTE — Consult Note (Addendum)
Post Psychiatry Consult   Reason for Consult:  Follow-up consult for this 39 year old man with a history of suicide attempts depression and personality disorder who came into the hospital after a overdose Referring Physician:  Verdell Carmine Patient Identification: [name] MRN:  [id number] Principal Diagnosis: Overdose Diagnosis:   Patient Active Problem List   Diagnosis Date Noted  . Aspiration pneumonia ([place]) [J69.0] 09/05/2015  . Altered mental status [R41.82] 09/05/2015  . Acute respiratory failure with hypoxia ([place]) [J96.01] 09/03/2015  . Overdose [T50.901A] 09/03/2015  . Depression [F32.9] 09/03/2015  . Suicide attempt by drug ingestion ([place]) [T50.902A] 11/05/2013  . Insulin overdose [T38.3X1A] 11/05/2013  . Alcohol abuse [F10.10] 11/05/2013  . Seizure disorder ([place]) [T97.741] 11/05/2013  . Hypophosphatemia [E83.39] 11/05/2013  . Tobacco abuse [Z72.0] 11/05/2013  . Situational depression [F43.21] 11/05/2013  . Rib pain [R07.81] 11/05/2013  . HTN (hypertension) [I10] 11/05/2013  . Hypoglycemia [E16.2] 11/05/2013    Total Time spent with patient: 30 minutes  Subjective:   [name] is a 39 y.o. male patient admitted with patient was admitted after an overdose of medication which he states was Benadryl. Possibly also involved clonazepam or other benzodiazepine as he is prescribed clonazepam at home. Concern about suicidality and dangerousness.  HPI:  On interview today the patient is calm, quiet and guarded. He generally provides one word answers. He states he overdosed on benadryl this past Wednesday because both of his parents are deceased and he feels lonely. Pt would not discuss if he was attempting to kill himself when he overdosed. Pt shared he has attempted to kill himself in the past. Pt has several risk factors for suicide including previous suicide attempts, impulsive tendencies, h/o depression and feelings of isolation. The pt was unable to come up  with any protective factors against suicide. Pt denies recent reunion fantasies to be with his mother and father but the pt did admit to reunion fantasies this past Wednesday when he took the overdose of benadryl.   Pt denies current SI, HI and AVH.   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 He has had a history of over 30 overdose attempts in the past with multiple statements of suicidal intent although a lot of the behavior really did not seem like he was aiming at harming himself. Somewhat complex past history. Had been relatively stable since living in a group home  Risk to Self: Is patient at risk for suicide?: No Risk to Others:   Prior Inpatient Therapy:   Prior Outpatient Therapy:    Past Medical History:  Past Medical History  Diagnosis Date  . Depression   . Anxiety   . Hypertension   . GERD (gastroesophageal reflux disease)    History reviewed. No pertinent past surgical history. Family History:  Family History  Problem Relation Age of Onset  . CAD Mother   . Diabetes Mellitus II Father    Family Psychiatric  History: Patient does not report any family history of mental illness Social History:  History  Alcohol Use No    Comment: pt states he no longer drinks     History  Drug Use No    Comment: had ecstacy in urine but deneis    Social History   Social History  . Marital Status: Single    Spouse Name: N/A  . Number of Children: N/A  . Years of Education: N/A   Social History Main Topics  . Smoking status:  Current Every Day Smoker  . Smokeless tobacco: None  . Alcohol Use: No     Comment: pt states he no longer drinks  . Drug Use: No     Comment: had ecstacy in urine but deneis  . Sexual Activity: Not Asked   Other Topics Concern  . None   Social History Narrative   Additional Social History: Lives in a group home.  Has a roommate there.  No siblings.  Father died 1 year ago. Mother  died about 4 years ago.    Allergies:  No Known Allergies  Labs:  Results for orders placed or performed during the hospital encounter of 09/03/15 (from the past 48 hour(s))  Glucose, capillary     Status: None   Collection Time: 09/03/15  2:55 PM  Result Value Ref Range   Glucose-Capillary 71 65 - 99 mg/dL  Troponin I     Status: None   Collection Time: 09/03/15  3:36 PM  Result Value Ref Range   Troponin I <0.03 <0.031 ng/mL    Comment:        NO INDICATION OF MYOCARDIAL INJURY.   Troponin I     Status: None   Collection Time: 09/03/15  8:41 PM  Result Value Ref Range   Troponin I <0.03 <0.031 ng/mL    Comment:        NO INDICATION OF MYOCARDIAL INJURY.   Basic metabolic panel     Status: Abnormal   Collection Time: 09/04/15  5:33 AM  Result Value Ref Range   Sodium 140 135 - 145 mmol/L   Potassium 3.9 3.5 - 5.1 mmol/L   Chloride 103 101 - 111 mmol/L   CO2 31 22 - 32 mmol/L   Glucose, Bld 89 65 - 99 mg/dL   BUN 5 (L) 6 - 20 mg/dL   Creatinine, Ser 0.80 0.61 - 1.24 mg/dL   Calcium 9.1 8.9 - 10.3 mg/dL   GFR calc non Af Amer >60 >60 mL/min   GFR calc Af Amer >60 >60 mL/min    Comment: (NOTE) The eGFR has been calculated using the CKD EPI equation. This calculation has not been validated in all clinical situations. eGFR's persistently <60 mL/min signify possible Chronic Kidney Disease.    Anion gap 6 5 - 15  CBC     Status: Abnormal   Collection Time: 09/04/15  5:33 AM  Result Value Ref Range   WBC 9.0 3.8 - 10.6 K/uL   RBC 3.69 (L) 4.40 - 5.90 MIL/uL   Hemoglobin 11.8 (L) 13.0 - 18.0 g/dL   HCT 34.8 (L) 40.0 - 52.0 %   MCV 94.3 80.0 - 100.0 fL   MCH 31.9 26.0 - 34.0 pg   MCHC 33.9 32.0 - 36.0 g/dL   RDW 13.7 11.5 - 14.5 %   Platelets 319 150 - 440 K/uL  CBC     Status: Abnormal   Collection Time: 09/05/15  5:15 AM  Result Value Ref Range   WBC 13.0 (H) 3.8 - 10.6 K/uL   RBC 3.85 (L) 4.40 - 5.90 MIL/uL   Hemoglobin 12.2 (L) 13.0 - 18.0 g/dL   HCT 35.8  (L) 40.0 - 52.0 %   MCV 93.0 80.0 - 100.0 fL   MCH 31.8 26.0 - 34.0 pg   MCHC 34.2 32.0 - 36.0 g/dL   RDW 13.6 11.5 - 14.5 %   Platelets 313 150 - 440 K/uL    Comment: COUNT MAY BE INACCURATE DUE TO FIBRIN CLUMPS.  Current Facility-Administered Medications  Medication Dose Route Frequency Provider Last Rate Last Dose  . acetaminophen (TYLENOL) tablet 650 mg  650 mg Oral Q4H PRN Fritzi Mandes, MD   650 mg at 09/03/15 2125  . clonazePAM (KLONOPIN) tablet 1 mg  1 mg Oral Daily Gonzella Lex, MD   1 mg at 09/05/15 0949  . clonazePAM (KLONOPIN) tablet 2 mg  2 mg Oral QHS Gonzella Lex, MD   2 mg at 09/04/15 2222  . divalproex (DEPAKOTE ER) 24 hr tablet 2,000 mg  2,000 mg Oral QHS Gonzella Lex, MD   2,000 mg at 09/04/15 2240  . escitalopram (LEXAPRO) tablet 20 mg  20 mg Oral Daily Gonzella Lex, MD   20 mg at 09/05/15 0949  . heparin injection 5,000 Units  5,000 Units Subcutaneous 3 times per day Aldean Jewett, MD   5,000 Units at 09/05/15 0510  . levofloxacin (LEVAQUIN) IVPB 750 mg  750 mg Intravenous Q24H Vira Blanco, RPH   750 mg at 09/04/15 1902  . sodium chloride 0.9 % injection 3 mL  3 mL Intravenous PRN Aldean Jewett, MD        Musculoskeletal: Strength & Muscle Tone: decreased Gait & Station: unable to stand Patient leans: N/A  Psychiatric Specialty Exam: Review of Systems  HENT: Negative.   Eyes: Negative.   Respiratory: Negative.   Cardiovascular: Negative.   Gastrointestinal: Negative.   Musculoskeletal: Negative.   Skin: Negative.   Neurological: Positive for weakness.  Psychiatric/Behavioral: Positive for memory loss. Negative for depression, suicidal ideas, hallucinations and substance abuse. The patient is not nervous/anxious and does not have insomnia.     Blood pressure 116/66, pulse 78, temperature 98.7 F (37.1 C), temperature source Oral, resp. rate 20, height $RemoveBe'5\' 11"'oblTVFLZl$  (1.803 m), weight 175 lb 9.6 oz (79.652 kg), SpO2 94 %.Body mass index is 24.5  kg/(m^2).  General Appearance: Disheveled  Eye Contact::  Minimal  Speech:  Slow  Volume:  Decreased  Mood:  Euthymic  Affect:  Flat  Thought Process:  Minimal. Answers questions in only a few words at most  Orientation:  Full (Time, Place, and Person)  Thought Content:  Negative  Suicidal Thoughts:  No  Homicidal Thoughts:  No  Memory:  Immediate;   Fair Recent;   Fair Remote;   Fair  Judgement:  Impaired  Insight:  Lacking  Psychomotor Activity:  Decreased  Concentration:  Fair  Recall:  Poor  Fund of Knowledge:Fair  Language: Fair  Akathisia:  No  Handed:  Right  AIMS (if indicated):     Assets:  Financial Resources/Insurance Housing Resilience  ADL's:  Impaired  Cognition: Impaired,  Mild  Sleep:      Treatment Plan Summary: Medication management  Disposition: Recommend psychiatric Inpatient admission when medically cleared. Supportive therapy provided about ongoing stressors.  Continue IVC.   Donita Brooks 09/05/2015 2:48 PM

## 2015-09-05 NOTE — Progress Notes (Signed)
Patient evaluated by Psych MD. With recommendation to continued IVC and [facility] However, there are no beds available at this time.   Call to outside hospitals. [facility], [facility] and [facility].    Clinical Social Worker will fax patient's referral out for hospitals to review in anticipation of discharges. CSW will continue to follow patient to assist with disposition based on recommendation from Psych MD.     Sammuel Hineseborah Joaopedro Eschbach. Theresia MajorsLCSWA, MSW Clinical Social Work Department Emergency Room [phone] 5:32 PM

## 2015-09-05 NOTE — Progress Notes (Signed)
[facility] [place] at [facility]   PATIENT NAME: [name]    MR#:  [id number]  DATE OF BIRTH:  [birth year]  SUBJECTIVE:  CHIEF COMPLAINT:   Chief Complaint  Patient presents with  . Altered Mental Status   Patient here due to altered mental status from drug overdose and noted to have an aspiration pneumonia. Alert and communicative, initially refuses breakfast, however ate later on. No problems ambulating . Marland Kitchen. Sitter at bedside. No complaints of pain or discomfort  REVIEW OF SYSTEMS:    Review of Systems  Unable to perform ROS: mental acuity    Nutrition: Soft diet Tolerating Diet: Yes Tolerating PT: Await evaluation   DRUG ALLERGIES:  No Known Allergies  VITALS:  Blood pressure 116/66, pulse 78, temperature 98.7 F (37.1 C), temperature source Oral, resp. rate 20, height 5\' 11"  (1.803 m), weight 79.652 kg (175 lb 9.6 oz), SpO2 94 %.  PHYSICAL EXAMINATION:   Physical Exam  GENERAL:  39 y.o.-year-old patient lying in the bed  in no apparent distress. Alert,  communicates EYES: Pupils equal, round, reactive to light and accommodation. No scleral icterus. Extraocular muscles intact.  HEENT: Head atraumatic, normocephalic. Oropharynx and nasopharynx clear.  NECK:  Supple, no jugular venous distention. No thyroid enlargement, no tenderness.  LUNGS: Normal breath sounds bilaterally, no wheezing, rales, rhonchi. No use of accessory muscles of respiration.  CARDIOVASCULAR: S1, S2 normal. No murmurs, rubs, or gallops.  ABDOMEN: Soft, nontender, nondistended. Bowel sounds present. No organomegaly or mass.  EXTREMITIES: No cyanosis, clubbing or edema b/l.    NEUROLOGIC: Cranial nerves II through XII are intact. No focal Motor or sensory deficits b/l.  Globally weak PSYCHIATRIC: The patient is alert and oriented x 2. Depressed SKIN: No obvious rash, lesion, or ulcer.    LABORATORY PANEL:   CBC  Recent Labs Lab 09/05/15 0515  WBC 13.0*  HGB  12.2*  HCT 35.8*  PLT 313   ------------------------------------------------------------------------------------------------------------------  Chemistries   Recent Labs Lab 09/03/15 1042 09/04/15 0533  NA 137 140  K 4.7 3.9  CL 105 103  CO2 27 31  GLUCOSE 82 89  BUN 6 5*  CREATININE 0.71 0.80  CALCIUM 8.3* 9.1  AST 26  --   ALT 28  --   ALKPHOS 80  --   BILITOT 0.5  --    ------------------------------------------------------------------------------------------------------------------  Cardiac Enzymes  Recent Labs Lab 09/03/15 2041  TROPONINI <0.03   ------------------------------------------------------------------------------------------------------------------  RADIOLOGY:  No results found.   ASSESSMENT AND PLAN:   39 year old male with past history of depression and previous suicide attempts, diabetes, GERD, anxiety who presented to the hospital due to altered mental status and also noted to be hypoxic.  #1 Aspiration pneumonia as a result of his lethargy and altered mental status. Change IV Levaquin to oral route. blood and sputum cultures are negative so far. Patient is afebrile and hemodynamically stable.  #2 altered mental status-this was likely metabolic encephalopathy due to drug overdose, however, cannot rule out psychologic condition,  withdrawal due to depression. -Patient says he took  Benadryl but his urine toxicology is positive for tricyclics and also benzodiazepines. -Continue supportive care and following mental status, which is clinically improving.  #3 history of depression with previous suicide attempts-patient is involuntarily committed. Continue one-to-one sitter. -Appreciate psychiatric consult. Likely behavioral medicine unit placement today. - cont. Depakote, Lexapro.   #4 anxiety-continue Klonopin.    All the records are reviewed and case discussed with Care Management/Social Workerr. Management  plans discussed with the  patient, family and they are in agreement.  CODE STATUS: Full  DVT Prophylaxis: Heparin subcutaneous  TOTAL TIME TAKING CARE OF THIS PATIENT: .   Coordination of care time 10 minutes   Tanashia Ciesla M.D on 09/05/2015 at 12:10 PM  Between 7am to 6pm - Pager - [phone]  After 6pm go to [email or web address] - password [email or web address]  [facility]  Office  [phone]  CC: Primary care physician; Pcp Not In System

## 2015-09-06 NOTE — Progress Notes (Signed)
Pt discharged back to his group home.  NO IV site, (unknown what happened to the IV that was in his right wrist).  Tele monitor taken off and returned, pt agreeable to go back.  Ambulated around station and also took a shower.  No further issues at this time,

## 2015-09-06 NOTE — Progress Notes (Signed)
CSW informed by MD that patient is medically ready to discharge back to group home.    Call to patient's ACT team,  [phone], ACT team will follow up with patient on Monday.    Call to group home to inform them patient will be ready to discharge back.  Spoke to owner Deloris will pick up patient between 7:00 and 8:00 tonight.  CSW has informed Charity fundraiserN.  FL2 updated and signed.  Discharge packet complete.  No additional CSW services needed at this time.  Sammuel Hineseborah Doug Bucklin. Theresia MajorsLCSWA, MSW Clinical Social Work Department Emergency Room [phone] 5:16 PM

## 2015-09-06 NOTE — Consult Note (Signed)
[facility]  Reason for Consult:  Follow-up consult for this 39 year old man with a history of suicide attempts depression and personality disorder who came into the hospital after a overdose Referring Physician:  Cherlynn KaiserSainani Patient Identification: [name] MRN:  [id number] Principal Diagnosis: Overdose Diagnosis:   Patient Active Problem List   Diagnosis Date Noted  . Aspiration pneumonia (HCC) [J69.0] 09/05/2015  . Altered mental status [R41.82] 09/05/2015  . Acute respiratory failure with hypoxia (HCC) [J96.01] 09/03/2015  . Overdose [T50.901A] 09/03/2015  . Depression [F32.9] 09/03/2015  . Suicide attempt by drug ingestion (HCC) [T50.902A] 11/05/2013  . Insulin overdose [T38.3X1A] 11/05/2013  . Alcohol abuse [F10.10] 11/05/2013  . Seizure disorder (HCC) [G40.909] 11/05/2013  . Hypophosphatemia [E83.39] 11/05/2013  . Tobacco abuse [Z72.0] 11/05/2013  . Situational depression [F43.21] 11/05/2013  . Rib pain [R07.81] 11/05/2013  . HTN (hypertension) [I10] 11/05/2013  . Hypoglycemia [E16.2] 11/05/2013    Total Time spent with patient: 30 minutes  Subjective:   [name] is a 39 y.o. male patient admitted with patient was admitted after an overdose of medication which he states was Benadryl. Possibly also involved clonazepam or other benzodiazepine as he is prescribed clonazepam at home. Concern about suicidality and dangerousness.  HPI:  Pt states he is doing well today. He discussed feeling less fatigued. He is remorseful of events which occurred this past week. Pt described his mood as "good." He notes being alive is good as well. He is not entirely certain of his psychotropic medication regimen but he shared he is medication compliant and without medication side effects. Pt shared ACTT visits him twice per week with one day usually being Monday.   Pt denies feeling hopeless, helpless and worthless. Pt verbally contracts for  safety. He describes his appetite as fair and sleep is stable and adequate. He denies SI, HI and AVH.    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 He has had a history of over 30 overdose attempts in the past with multiple statements of suicidal intent although a lot of the behavior really did not seem like he was aiming at harming himself. Somewhat complex past history. Had been relatively stable since living in a group home - From Dr. Toni Amendlapacs note dated 09-04-15.   Risk to Self: Is patient at risk for suicide?: No Risk to Others:   Prior Inpatient Therapy:   Prior Outpatient Therapy:    Past Medical History:  Past Medical History  Diagnosis Date  . Depression   . Anxiety   . Hypertension   . GERD (gastroesophageal reflux disease)    History reviewed. No pertinent past surgical history. Family History:  Family History  Problem Relation Age of Onset  . CAD Mother   . Diabetes Mellitus II Father    Family Psychiatric  History: Patient does not report any family history of mental illness Social History:  History  Alcohol Use No    Comment: pt states he no longer drinks     History  Drug Use No    Comment: had ecstacy in urine but deneis    Social History   Social History  . Marital Status: Single    Spouse Name: N/A  . Number of Children: N/A  . Years of Education: N/A   Social History Main Topics  . Smoking status: Current Every Day Smoker  . Smokeless tobacco: None  . Alcohol Use: No  Comment: pt states he no longer drinks  . Drug Use: No     Comment: had ecstacy in urine but deneis  . Sexual Activity: Not Asked   Other Topics Concern  . None   Social History Narrative   Additional Social History: Lives in a group home.  Has a roommate there.  No siblings.  Father died 1 year ago. Mother died about 4 years ago.    Allergies:  No Known Allergies  Labs:  Results for orders placed or  performed during the hospital encounter of 09/03/15 (from the past 48 hour(s))  CBC     Status: Abnormal   Collection Time: 09/05/15  5:15 AM  Result Value Ref Range   WBC 13.0 (H) 3.8 - 10.6 K/uL   RBC 3.85 (L) 4.40 - 5.90 MIL/uL   Hemoglobin 12.2 (L) 13.0 - 18.0 g/dL   HCT 16.1 (L) 09.6 - 04.5 %   MCV 93.0 80.0 - 100.0 fL   MCH 31.8 26.0 - 34.0 pg   MCHC 34.2 32.0 - 36.0 g/dL   RDW 40.9 81.1 - 91.4 %   Platelets 313 150 - 440 K/uL    Comment: COUNT MAY BE INACCURATE DUE TO FIBRIN CLUMPS.    Current Facility-Administered Medications  Medication Dose Route Frequency Provider Last Rate Last Dose  . acetaminophen (TYLENOL) tablet 650 mg  650 mg Oral Q4H PRN Enedina Finner, MD   650 mg at 09/03/15 2125  . clonazePAM (KLONOPIN) tablet 1 mg  1 mg Oral Daily Audery Amel, MD   1 mg at 09/06/15 1056  . clonazePAM (KLONOPIN) tablet 2 mg  2 mg Oral QHS Audery Amel, MD   2 mg at 09/05/15 2228  . divalproex (DEPAKOTE ER) 24 hr tablet 2,000 mg  2,000 mg Oral QHS Audery Amel, MD   2,000 mg at 09/05/15 2228  . escitalopram (LEXAPRO) tablet 20 mg  20 mg Oral Daily Audery Amel, MD   20 mg at 09/06/15 1055  . heparin injection 5,000 Units  5,000 Units Subcutaneous 3 times per day Gale Journey, MD   5,000 Units at 09/06/15 0524  . levofloxacin (LEVAQUIN) tablet 750 mg  750 mg Oral Daily Katharina Caper, MD   750 mg at 09/06/15 1056  . sodium chloride 0.9 % injection 3 mL  3 mL Intravenous PRN Gale Journey, MD        Musculoskeletal: Strength & Muscle Tone: decreased Gait & Station: unable to stand Patient leans: N/A  Psychiatric Specialty Exam: Review of Systems  HENT: Negative.   Eyes: Negative.   Respiratory: Negative.   Cardiovascular: Negative.   Gastrointestinal: Negative.   Musculoskeletal: Negative.   Skin: Negative.   Neurological: Positive for weakness.  Psychiatric/Behavioral: Positive for memory loss. Negative for depression, suicidal ideas, hallucinations and  substance abuse. The patient is not nervous/anxious and does not have insomnia.     Blood pressure 115/66, pulse 82, temperature 98.5 F (36.9 C), temperature source Oral, resp. rate 26, height  (1.803 m), weight 79.652 kg (175 lb 9.6 oz), SpO2 93 %.Body mass index is 24.5 kg/(m^2).  General Appearance: Casual  Eye Contact::  Fair  Speech:  Clear and Coherent and Normal Rate  Volume:  Normal  Mood:  Euthymic  Affect:  Flat  Thought Process:  Patient speaking in more complete sentences.   Orientation:  Full (Time, Place, and Person)  Thought Content:  Negative  Suicidal Thoughts:  No  Homicidal Thoughts:  No  Memory:  Immediate;   Fair Recent;   Fair Remote;   Fair  Judgement:  Poor  Insight:  Fair  Psychomotor Activity:  Decreased but improving.   Concentration:  Fair  Recall:  [employer] of Knowledge:Good  Language: Fair  Akathisia:  No  Handed:  Right  AIMS (if indicated):     Assets:  Financial Resources/Insurance Housing Resilience  ADL's:  Intact  Cognition: WNL  Sleep:      Treatment Plan Summary: Plan Continue medications as prescribed.  Discharge back to Group Home.   Disposition: No evidence of imminent risk to self or others at present.   Patient does not meet criteria for psychiatric inpatient admission. Supportive therapy provided about ongoing stressors. Discussed crisis plan, support from social network, calling 911, coming to the Emergency Department, and calling Suicide Hotline. ACTT will follow up with pt tomorrow at his group home.     Of note pt was not IVCd and therefore does not require reversal of this paperwork.   Gena Fray 09/06/2015 11:29 AM

## 2015-09-06 NOTE — Progress Notes (Signed)
[facility] [place] [facility]   PATIENT NAME: [name]    MR#:  [id number]  DATE OF BIRTH:  Dec 14, 1975  SUBJECTIVE:  CHIEF COMPLAINT:   Chief Complaint  Patient presents with  . Altered Mental Status   Patient here due to altered mental status from drug overdose and noted to have an aspiration pneumonia. It is somewhat somnolent but able to interact whenever asked questions . Sister remains at that site . In home surroundings Patient is active, able to walk around and goes outside to smoke, here in the hospital he is more or less confined to the bed, is not willing to get up and move around .  REVIEW OF SYSTEMS:    Review of Systems  Unable to perform ROS: mental acuity    Nutrition: Soft diet Tolerating Diet: Yes Tolerating PT: Await evaluation   DRUG ALLERGIES:  No Known Allergies  VITALS:  Blood pressure 115/66, pulse 82, temperature 98.5 F (36.9 C), temperature source Oral, resp. rate 26, height 5\' 11"  (1.803 m), weight 79.652 kg (175 lb 9.6 oz), SpO2 93 %.  PHYSICAL EXAMINATION:   Physical Exam  GENERAL:  39 y.o.-year-old patient lying in the bed  in no apparent distress. Alert,  Communicates, minimal interaction remains withdrawn emotionally  EYES: Pupils equal, round, reactive to light and accommodation. No scleral icterus. Extraocular muscles intact.  HEENT: Head atraumatic, normocephalic. Oropharynx and nasopharynx clear.  NECK:  Supple, no jugular venous distention. No thyroid enlargement, no tenderness.  LUNGS: Normal breath sounds bilaterally, no wheezing, rales, rhonchi. No use of accessory muscles of respiration.  CARDIOVASCULAR: S1, S2 normal. No murmurs, rubs, or gallops.  ABDOMEN: Soft, nontender, nondistended. Bowel sounds present. No organomegaly or mass.  EXTREMITIES: No cyanosis, clubbing or edema b/l.    NEUROLOGIC: Cranial nerves II through XII are intact. No focal Motor or sensory deficits b/l.  Globally  weak PSYCHIATRIC: The patient is alert and oriented x 2. Depressed SKIN: No obvious rash, lesion, or ulcer.    LABORATORY PANEL:   CBC  Recent Labs Lab 09/05/15 0515  WBC 13.0*  HGB 12.2*  HCT 35.8*  PLT 313   ------------------------------------------------------------------------------------------------------------------  Chemistries   Recent Labs Lab 09/03/15 1042 09/04/15 0533  NA 137 140  K 4.7 3.9  CL 105 103  CO2 27 31  GLUCOSE 82 89  BUN 6 5*  CREATININE 0.71 0.80  CALCIUM 8.3* 9.1  AST 26  --   ALT 28  --   ALKPHOS 80  --   BILITOT 0.5  --    ------------------------------------------------------------------------------------------------------------------  Cardiac Enzymes  Recent Labs Lab 09/03/15 2041  TROPONINI <0.03   ------------------------------------------------------------------------------------------------------------------  RADIOLOGY:  No results found.   ASSESSMENT AND PLAN:   39 year old male with past history of depression and previous suicide attempts, diabetes, GERD, anxiety who presented to the hospital due to altered mental status and also noted to be hypoxic.  #1 Aspiration pneumonia as a result of lethargy and altered mental status.Continue oral Levaquin. blood and sputum cultures are negative so far. Patient is afebrile and hemodynamically stable.His oxygenation is stable, check O2 sats on exertion on room air  #2 altered mental status-this was likely metabolic encephalopathy due to drug overdose, however, cannot rule out psychologic condition,  withdrawal due to depression. -Patient says he took  Benadryl but his urine toxicology is positive for tricyclics and also benzodiazepines. -Continue supportive care and following mental status, which is clinicallyimproved but not at baseline, likely due  to psychologic withdrawal.  #3 history of depression with previous suicide attempts-patient is involuntarily committed. Continue  one-to-one sitter. -Appreciate psychiatric consult. Likely behavioral medicine unit placement when bed is available. - cont. Depakote, Lexapro.   #4 anxiety-continue Klonopin.    All the records are reviewed and case discussed with Care Management/Social Workerr. Management plans discussed with the patient, family and they are in agreement.  CODE STATUS: Full  DVT Prophylaxis: Heparin subcutaneous  TOTAL TIME TAKING CARE OF THIS PATIENT: 35 minutes.   Coordination of care time 20 minutes    Hanif Radin M.D on 09/06/2015 at 10:59 AM  Between 7am to 6pm - Pager - [phone]  After 6pm go to [email or web address] - password [email or web address]  [facility]  Office  [phone]  CC: Primary care physician; Pcp Not In System

## 2015-09-08 LAB — CULTURE, BLOOD (ROUTINE X 2)
CULTURE: NO GROWTH
Culture: NO GROWTH

## 2015-10-28 IMAGING — CR DG CHEST 1V PORT
1 series · 1 of 1 positions shown · non-contrast
Comparison: none

REASON FOR EXAM: OVERDOSE
COMMENTS:

[ap]
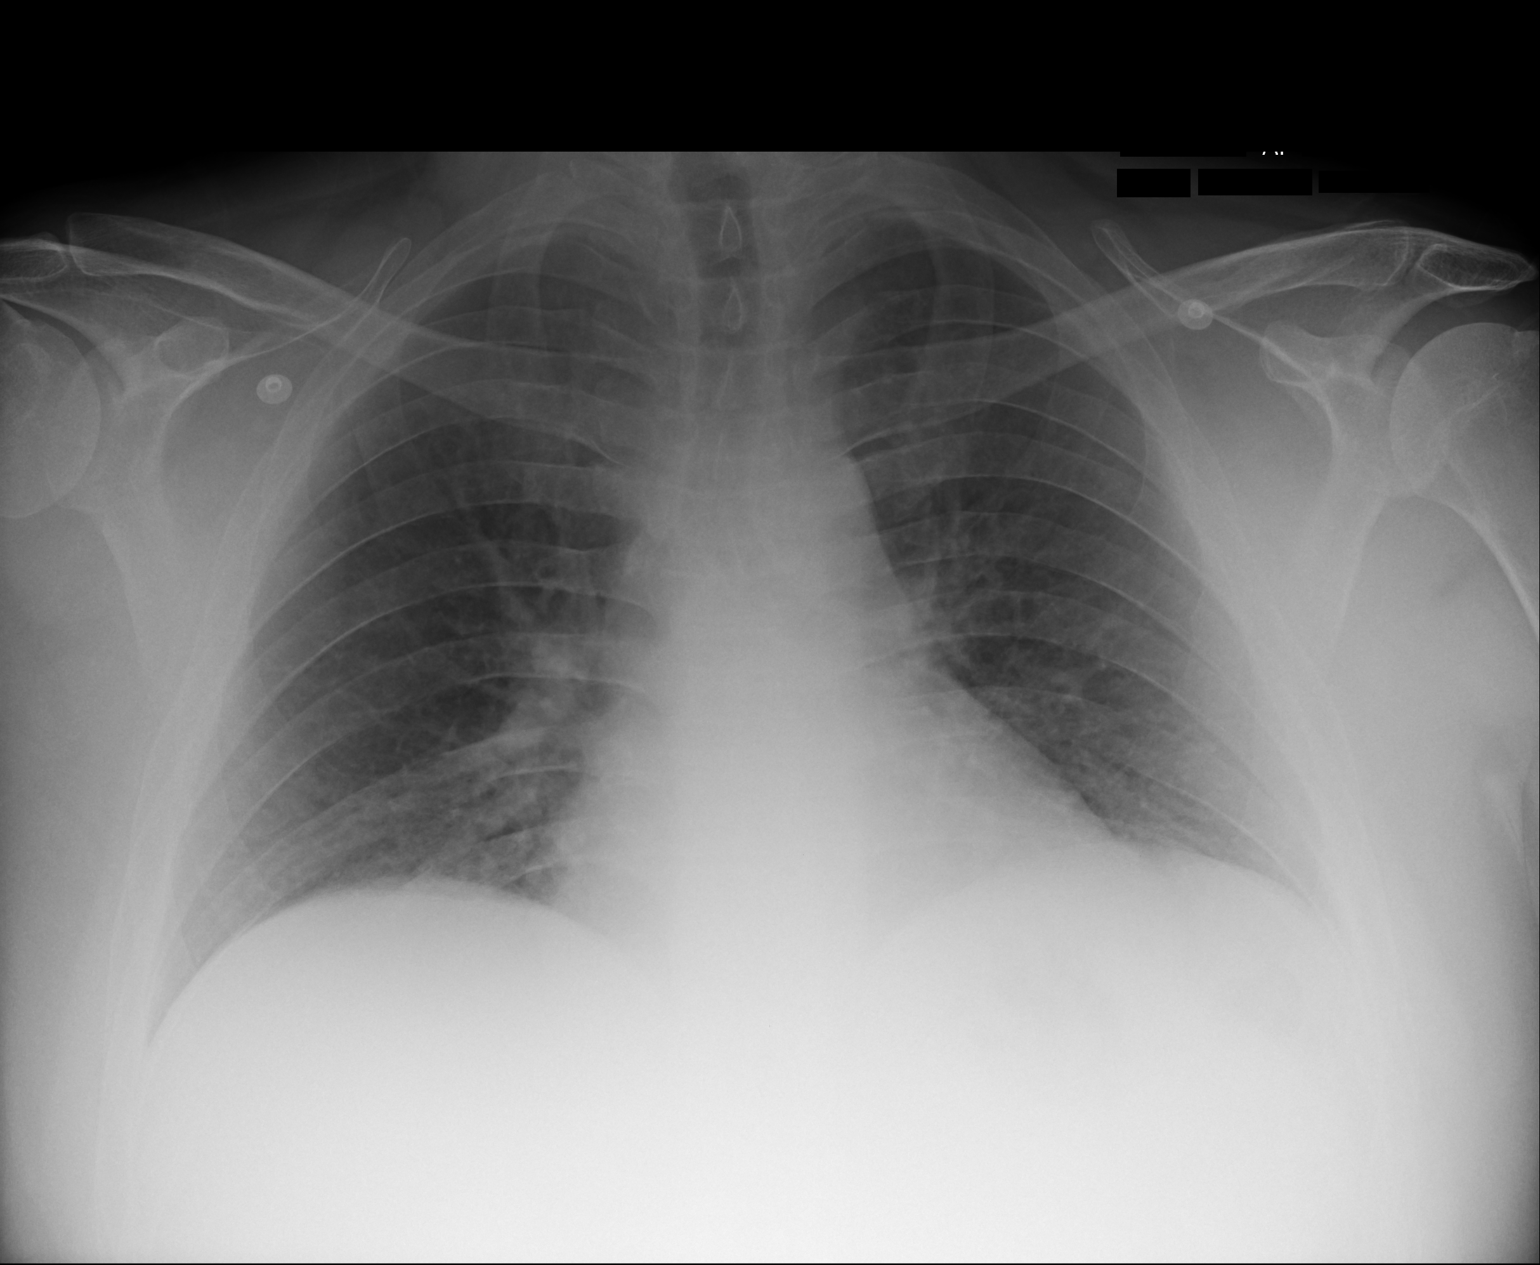

[1 of 1 positions shown; findings below may reference images not displayed]

PROCEDURE:     DXR - DXR PORTABLE CHEST SINGLE VIEW  - September 22, 2013  [DATE]

RESULT:     Comparison is made to the study of 02/22/2013.

The cardiac silhouette appears normal. There is minimal lung base
atelectasis. There is no edema, consolidation, effusion or pneumothorax. The
bony structures appear unremarkable.
IMPRESSION: 1. Minimal lung base atelectasis.

[REDACTED]

## 2015-11-08 ENCOUNTER — Emergency Department
Admission: EM | Admit: 2015-11-08 | Discharge: 2015-11-11 | Disposition: A | Discharge status: Other Acute Inpatient Hospital | Payer: Medicare Other | Attending: Student | Admitting: Student

## 2015-11-08 ENCOUNTER — Encounter: Payer: Self-pay | Admitting: Emergency Medicine

## 2015-11-08 ENCOUNTER — Emergency Department: Payer: Medicare Other

## 2015-11-08 DIAGNOSIS — Z791 Long term (current) use of non-steroidal anti-inflammatories (NSAID): Secondary | ICD-10-CM | POA: Diagnosis not present

## 2015-11-08 DIAGNOSIS — F329 Major depressive disorder, single episode, unspecified: Secondary | ICD-10-CM | POA: Insufficient documentation

## 2015-11-08 DIAGNOSIS — F332 Major depressive disorder, recurrent severe without psychotic features: Secondary | ICD-10-CM | POA: Diagnosis present

## 2015-11-08 DIAGNOSIS — F1021 Alcohol dependence, in remission: Secondary | ICD-10-CM | POA: Diagnosis not present

## 2015-11-08 DIAGNOSIS — R45851 Suicidal ideations: Secondary | ICD-10-CM | POA: Diagnosis not present

## 2015-11-08 DIAGNOSIS — R51 Headache: Secondary | ICD-10-CM | POA: Diagnosis not present

## 2015-11-08 DIAGNOSIS — I10 Essential (primary) hypertension: Secondary | ICD-10-CM | POA: Diagnosis not present

## 2015-11-08 DIAGNOSIS — R569 Unspecified convulsions: Secondary | ICD-10-CM | POA: Diagnosis present

## 2015-11-08 DIAGNOSIS — F419 Anxiety disorder, unspecified: Secondary | ICD-10-CM | POA: Diagnosis not present

## 2015-11-08 DIAGNOSIS — F445 Conversion disorder with seizures or convulsions: Secondary | ICD-10-CM | POA: Diagnosis not present

## 2015-11-08 DIAGNOSIS — Z79899 Other long term (current) drug therapy: Secondary | ICD-10-CM | POA: Insufficient documentation

## 2015-11-08 DIAGNOSIS — F17210 Nicotine dependence, cigarettes, uncomplicated: Secondary | ICD-10-CM | POA: Diagnosis not present

## 2015-11-08 DIAGNOSIS — G4700 Insomnia, unspecified: Secondary | ICD-10-CM

## 2015-11-08 DIAGNOSIS — F32A Depression, unspecified: Secondary | ICD-10-CM

## 2015-11-08 HISTORY — DX: Unspecified convulsions: R56.9

## 2015-11-08 LAB — URINALYSIS COMPLETE WITH MICROSCOPIC (ARMC ONLY)
BACTERIA UA: NONE SEEN
Bilirubin Urine: NEGATIVE
Glucose, UA: NEGATIVE mg/dL
HGB URINE DIPSTICK: NEGATIVE
Ketones, ur: NEGATIVE mg/dL
LEUKOCYTES UA: NEGATIVE
NITRITE: NEGATIVE
PH: 6 (ref 5.0–8.0)
PROTEIN: NEGATIVE mg/dL
RBC / HPF: NONE SEEN RBC/hpf (ref 0–5)
SPECIFIC GRAVITY, URINE: 1.006 (ref 1.005–1.030)
Squamous Epithelial / LPF: NONE SEEN

## 2015-11-08 LAB — CBC WITH DIFFERENTIAL/PLATELET
BASOS ABS: 0 10*3/uL (ref 0–0.1)
Basophils Relative: 1 %
Eosinophils Absolute: 0.1 10*3/uL (ref 0–0.7)
Eosinophils Relative: 3 %
HEMATOCRIT: 50.7 % (ref 40.0–52.0)
Hemoglobin: 17.4 g/dL (ref 13.0–18.0)
LYMPHS ABS: 1.9 10*3/uL (ref 1.0–3.6)
LYMPHS PCT: 35 %
MCH: 32.2 pg (ref 26.0–34.0)
MCHC: 34.2 g/dL (ref 32.0–36.0)
MCV: 94.2 fL (ref 80.0–100.0)
MONO ABS: 0.9 10*3/uL (ref 0.2–1.0)
Monocytes Relative: 16 %
NEUTROS ABS: 2.6 10*3/uL (ref 1.4–6.5)
Neutrophils Relative %: 45 %
Platelets: 204 10*3/uL (ref 150–440)
RBC: 5.38 MIL/uL (ref 4.40–5.90)
RDW: 13.6 % (ref 11.5–14.5)
WBC: 5.6 10*3/uL (ref 3.8–10.6)

## 2015-11-08 LAB — COMPREHENSIVE METABOLIC PANEL
ALBUMIN: 4.9 g/dL (ref 3.5–5.0)
ALT: 52 U/L (ref 17–63)
ANION GAP: 7 (ref 5–15)
AST: 41 U/L (ref 15–41)
Alkaline Phosphatase: 56 U/L (ref 38–126)
BILIRUBIN TOTAL: 0.5 mg/dL (ref 0.3–1.2)
BUN: 10 mg/dL (ref 6–20)
CHLORIDE: 104 mmol/L (ref 101–111)
CO2: 27 mmol/L (ref 22–32)
Calcium: 9.8 mg/dL (ref 8.9–10.3)
Creatinine, Ser: 0.96 mg/dL (ref 0.61–1.24)
GFR calc Af Amer: >60 mL/min (ref >60)
GFR calc non Af Amer: >60 mL/min (ref >60)
GLUCOSE: 98 mg/dL (ref 65–99)
POTASSIUM: 4.2 mmol/L (ref 3.5–5.1)
Sodium: 138 mmol/L (ref 135–145)
TOTAL PROTEIN: 8.8 g/dL — AB (ref 6.5–8.1)

## 2015-11-08 LAB — GLUCOSE, CAPILLARY: Glucose-Capillary: 106 mg/dL — ABNORMAL HIGH (ref 65–99)

## 2015-11-08 LAB — URINE DRUG SCREEN, QUALITATIVE (ARMC ONLY)
Amphetamines, Ur Screen: NOT DETECTED
BARBITURATES, UR SCREEN: NOT DETECTED
BENZODIAZEPINE, UR SCRN: NOT DETECTED
CANNABINOID 50 NG, UR ~~LOC~~: NOT DETECTED
Cocaine Metabolite,Ur ~~LOC~~: NOT DETECTED
MDMA (Ecstasy)Ur Screen: NOT DETECTED
Methadone Scn, Ur: NOT DETECTED
Opiate, Ur Screen: NOT DETECTED
Phencyclidine (PCP) Ur S: NOT DETECTED
TRICYCLIC, UR SCREEN: NOT DETECTED

## 2015-11-08 LAB — ETHANOL: Alcohol, Ethyl (B): <5 mg/dL (ref <5)

## 2015-11-08 LAB — VALPROIC ACID LEVEL

## 2015-11-08 MED ORDER — HYDROXYZINE HCL 25 MG PO TABS
50.0000 mg | ORAL_TABLET | Freq: Two times a day (BID) | ORAL | Status: DC | PRN
Start: 2015-11-08 — End: 2015-11-11
  Administered 2015-11-08 – 2015-11-11 (×5): 50 mg via ORAL
  Filled 2015-11-08 (×7): qty 2

## 2015-11-08 MED ORDER — CLONAZEPAM 0.5 MG PO TABS: 1.0000 mg | ORAL_TABLET | Freq: Every day | ORAL | Status: DC

## 2015-11-08 MED ORDER — CLONAZEPAM 0.5 MG PO TABS: 2.0000 mg | ORAL_TABLET | Freq: Every day | ORAL | Status: DC

## 2015-11-08 MED ORDER — DIVALPROEX SODIUM ER 500 MG PO TB24: 2000.0000 mg | ORAL_TABLET | Freq: Every day | ORAL | Status: DC

## 2015-11-08 MED ORDER — HYDROXYZINE HCL 25 MG PO TABS
50.0000 mg | ORAL_TABLET | Freq: Every day | ORAL | Status: DC
  Administered 2015-11-08 – 2015-11-10 (×3): 50 mg via ORAL
  Filled 2015-11-08: qty 2

## 2015-11-08 MED ORDER — AMLODIPINE BESYLATE 5 MG PO TABS
2.5000 mg | ORAL_TABLET | Freq: Every day | ORAL | Status: DC
  Administered 2015-11-08 – 2015-11-10 (×3): 2.5 mg via ORAL
  Filled 2015-11-08 (×4): qty 1

## 2015-11-08 MED ORDER — MELOXICAM 7.5 MG PO TABS
15.0000 mg | ORAL_TABLET | Freq: Every day | ORAL | Status: DC
  Administered 2015-11-08 – 2015-11-11 (×4): 15 mg via ORAL
  Filled 2015-11-08 (×2): qty 2
  Filled 2015-11-08 (×2): qty 1
  Filled 2015-11-08 (×2): qty 2
  Filled 2015-11-08: qty 1

## 2015-11-08 MED ORDER — PANTOPRAZOLE SODIUM 40 MG PO TBEC
40.0000 mg | DELAYED_RELEASE_TABLET | Freq: Every day | ORAL | Status: DC
  Administered 2015-11-08 – 2015-11-11 (×4): 40 mg via ORAL
  Filled 2015-11-08 (×4): qty 1

## 2015-11-08 MED ORDER — TRAZODONE HCL 100 MG PO TABS: 100.0000 mg | ORAL_TABLET | Freq: Every day | ORAL | Status: DC

## 2015-11-08 MED ORDER — BUPROPION HCL ER (XL) 150 MG PO TB24
150.0000 mg | ORAL_TABLET | Freq: Every day | ORAL | Status: DC
  Administered 2015-11-08 – 2015-11-11 (×4): 150 mg via ORAL
  Filled 2015-11-08 (×4): qty 1

## 2015-11-08 MED ORDER — ACETAMINOPHEN 500 MG PO TABS
1000.0000 mg | ORAL_TABLET | Freq: Once | ORAL | Status: AC
  Administered 2015-11-08: 1000 mg via ORAL
  Filled 2015-11-08: qty 2

## 2015-11-08 MED ORDER — CLONAZEPAM 1 MG PO TABS
1.0000 mg | ORAL_TABLET | Freq: Three times a day (TID) | ORAL | Status: DC
  Administered 2015-11-08 – 2015-11-09 (×2): 1 mg via ORAL
  Filled 2015-11-08 (×2): qty 1

## 2015-11-08 MED ORDER — ESCITALOPRAM OXALATE 10 MG PO TABS
20.0000 mg | ORAL_TABLET | Freq: Every day | ORAL | Status: DC
  Administered 2015-11-09 – 2015-11-11 (×3): 20 mg via ORAL
  Filled 2015-11-08 (×3): qty 2

## 2015-11-08 NOTE — ED Notes (Signed)
BEHAVIORAL HEALTH ROUNDING  Patient sleeping: No.  Patient alert and oriented: yes  Behavior appropriate: Yes. ; If no, describe:  Nutrition and fluids offered: Yes  Toileting and hygiene offered: Yes  Sitter present: not applicable  Law enforcement present: Yes ODS  

## 2015-11-08 NOTE — ED Notes (Signed)
Pt transferred over from main ed, with c/o si with plan to jump out in traffic. He still c/o si and is aware he will be here tonight

## 2015-11-08 NOTE — ED Notes (Signed)
Pt visualized in NAD. Pt resting in bed with lights off and TV on. Denies needs at this time.

## 2015-11-08 NOTE — Consult Note (Addendum)
[facility] Face-to-Face Psychiatry Consult   Reason for Consult:  SI without a plan Referring Physician:  Hinda Kehr, MD  Patient Identification: [name] MRN:  [id number]   Principal Diagnosis: MDD (major depressive disorder), recurrent severe, without psychosis ([place])    Diagnosis:   Patient Active Problem List   Diagnosis Date Noted  . Aspiration pneumonia ([place]) [J69.0] 09/05/2015  . Altered mental status [R41.82] 09/05/2015  . Acute respiratory failure with hypoxia ([place]) [J96.01] 09/03/2015  . Overdose [T50.901A] 09/03/2015  . Depression [F32.9] 09/03/2015  . Suicide attempt by drug ingestion ([place]) [T50.902A] 11/05/2013  . Insulin overdose [T38.3X1A] 11/05/2013  . Alcohol abuse [F10.10] 11/05/2013  . Seizure disorder ([place]) [N46.270] 11/05/2013  . Hypophosphatemia [E83.39] 11/05/2013  . Tobacco abuse [Z72.0] 11/05/2013  . Situational depression [F43.21] 11/05/2013  . Rib pain [R07.81] 11/05/2013  . HTN (hypertension) [I10] 11/05/2013  . Hypoglycemia [E16.2] 11/05/2013    Total Time spent with patient: 45 minutes  Subjective:   [name] is a 39 y.o. male patient who presents to [facility] ED with seizures due to stress. Please see Dr. Kerry Fort ED note for further details. Once the pt was evaluated for seizures & his valproic acid level returned undetectable, the pt began to report suicidality.  Patient was not placed on IVC at that time due to very low concern that pt is a danger to himself.   Per [facility] Assessment note, the pt was evaluated in the ED as per above then reported depression with SI without a plan.  Pt does not living in his current group home. He has not supports including friends or family.  SI resolved. Pt denies HI and AVH. Pt denies drug use.   Nursing notes reviewed. Pt stated he wanted to be admitted to behavioral medicine. Reported depression with intermittent SI without a plan.   Today on interview, patient discussed worsening stressors at his group  home. He discusses a frequent h/o loss including the death of his father about 1.5 years ago and the death of his mother 4 years ago. After the death of his father, his girlfriend of 10 years left him. He also lost the property he had including 2 trailer homes. "I feel like I'm taking up everybody's air." He denies feeling happy to be alive.   Pt endorses a h/o depression for several months. He endorses decreased sleep, anhedonia, guilt, decreased energy, variable appetite and decreased concentration. He endorses weight gain, decreased motivation, and feeling hopeless, helpless and worthless. He has reunion fantasies of wanting to be with his mother again.  He denies owning a weapon but shared he has easy access to knives and weapons at the group home.   Despite having a h/o bipolar disorder, the pt denies symptoms consistent with a h/o mania. He also shared he   He endorses SI without a plan then states each day he considers walking out in front of a train. He denies HI & AVH. Pt also states he is not taking his medications including valproic acid as prescribed. He states he takes bupropion each day.   Past Psychiatric History: Dx: Depression and bipolar disorder Psychiatrist: comes to the group home once every 3 months per the pt Therapist: Denies  Hospitalizations: Endorses at [facility] ECT: Denies  Suicide attempt/Self-harm: Endorses. OD via insulin and  Homicide attempts/harming others: Denies Previous Medication Trials: Quetiapine   Risk to Self: Suicidal Ideation: Yes-Currently Present Suicidal Intent: No Is patient at risk for suicide?: No Suicidal  Plan?: No Access to Means: No What has been your use of drugs/alcohol within the last 12 months?: None reported How many times?: 1 Other Self Harm Risks: None reported Triggers for Past Attempts: None known Intentional Self Injurious Behavior: None Risk to Others: Homicidal Ideation: No Thoughts of Harm to Others:  No Current Homicidal Intent: No Current Homicidal Plan: No Access to Homicidal Means: No Identified Victim: None identified History of harm to others?: No Assessment of Violence: None Noted Violent Behavior Description: None reported Does patient have access to weapons?: No Criminal Charges Pending?: No Does patient have a court date: No Prior Inpatient Therapy: Prior Inpatient Therapy: Yes Prior Therapy Dates: 08/2015 Prior Therapy Facilty/Provider([facility]): [facility] Reason for Treatment: depression Prior Outpatient Therapy: Prior Outpatient Therapy: Yes Prior Therapy Dates: current Prior Therapy Facilty/Provider(s): [facility] Reason for Treatment: depression Does patient have an ACCT team?: Yes Does patient have Intensive In-House Services?  : No Does patient have [facility] services? : No Does patient have P4CC services?: No  Past Medical History:  Past Medical History  Diagnosis Date  . Depression   . Anxiety   . Hypertension   . GERD (gastroesophageal reflux disease)   . Seizures [facility])     Past Surgical History  Procedure Laterality Date  . Finger surgery Left     5th digit   Family History:  Family History  Problem Relation Age of Onset  . CAD Mother   . Diabetes Mellitus II Father     Family Psychiatric History:  Depression: Mother Anxiety: Mother, grandmother Bipolar disorder: "I don't know."  Schizophrenia: Denies  Panic disorder: Denies   Substance abuse: Possibly mother as she "used pills" frequently Alcohol: Denies   Suicide: Denies   Social History:  History  Alcohol Use No    Comment: pt states he no longer drinks     History  Drug Use No    Comment: had ecstacy in urine but deneis    Social History   Social History  . Marital Status: Single    Spouse Name: N/A  . Number of Children: N/A  . Years of Education: N/A   Social History Main Topics  . Smoking status: Current Every Day Smoker -- 0.50 packs/day    Types: Cigarettes  . Smokeless  tobacco: None  . Alcohol Use: No     Comment: pt states he no longer drinks  . Drug Use: No     Comment: had ecstacy in urine but deneis  . Sexual Activity: Not Asked   Other Topics Concern  . None   Social History Narrative   Additional Social History:    History of alcohol / drug use?: No history of alcohol / drug abuse  Pt has been sober from alcohol for 2 years. He used to drink 6-12 beers per day.  Patient denies a h/o physical, sexual and emotional abuse.   Allergies:  No Known Allergies  Labs:  Results for orders placed or performed during the hospital encounter of 11/08/15 (from the past 48 hour(s))  Glucose, capillary     Status: Abnormal   Collection Time: 11/08/15  2:46 AM  Result Value Ref Range   Glucose-Capillary 106 (H) 65 - 99 mg/dL  Comprehensive metabolic panel     Status: Abnormal   Collection Time: 11/08/15  2:49 AM  Result Value Ref Range   Sodium 138 135 - 145 mmol/L   Potassium 4.2 3.5 - 5.1 mmol/L    Comment: HEMOLYSIS AT THIS LEVEL MAY AFFECT  RESULT   Chloride 104 101 - 111 mmol/L   CO2 27 22 - 32 mmol/L   Glucose, Bld 98 65 - 99 mg/dL   BUN 10 6 - 20 mg/dL   Creatinine, Ser 3.72 0.61 - 1.24 mg/dL   Calcium 9.8 8.9 - 90.2 mg/dL   Total Protein 8.8 (H) 6.5 - 8.1 g/dL   Albumin 4.9 3.5 - 5.0 g/dL   AST 41 15 - 41 U/L   ALT 52 17 - 63 U/L   Alkaline Phosphatase 56 38 - 126 U/L   Total Bilirubin 0.5 0.3 - 1.2 mg/dL   GFR calc non Af Amer >60 >60 mL/min   GFR calc Af Amer >60 >60 mL/min    Comment: (NOTE) The eGFR has been calculated using the CKD EPI equation. This calculation has not been validated in all clinical situations. eGFR's persistently <60 mL/min signify possible Chronic Kidney Disease.    Anion gap 7 5 - 15  CBC WITH DIFFERENTIAL     Status: None   Collection Time: 11/08/15  2:49 AM  Result Value Ref Range   WBC 5.6 3.8 - 10.6 K/uL   RBC 5.38 4.40 - 5.90 MIL/uL   Hemoglobin 17.4 13.0 - 18.0 g/dL   HCT 11.1 55.2 - 08.0 %    MCV 94.2 80.0 - 100.0 fL   MCH 32.2 26.0 - 34.0 pg   MCHC 34.2 32.0 - 36.0 g/dL   RDW 22.3 36.1 - 22.4 %   Platelets 204 150 - 440 K/uL   Neutrophils Relative % 45 %   Neutro Abs 2.6 1.4 - 6.5 K/uL   Lymphocytes Relative 35 %   Lymphs Abs 1.9 1.0 - 3.6 K/uL   Monocytes Relative 16 %   Monocytes Absolute 0.9 0.2 - 1.0 K/uL   Eosinophils Relative 3 %   Eosinophils Absolute 0.1 0 - 0.7 K/uL   Basophils Relative 1 %   Basophils Absolute 0.0 0 - 0.1 K/uL  Ethanol/ETOH     Status: None   Collection Time: 11/08/15  2:49 AM  Result Value Ref Range   Alcohol, Ethyl (B) <5 <5 mg/dL    Comment:        LOWEST DETECTABLE LIMIT FOR SERUM ALCOHOL IS 5 mg/dL FOR MEDICAL PURPOSES ONLY   Valproic acid level     Status: Abnormal   Collection Time: 11/08/15  2:49 AM  Result Value Ref Range   Valproic Acid Lvl <10 (L) 50.0 - 100.0 ug/mL  Urinalysis complete, with microscopic ([facility] only)     Status: Abnormal   Collection Time: 11/08/15  3:55 AM  Result Value Ref Range   Color, Urine STRAW (A) YELLOW   APPearance CLEAR (A) CLEAR   Glucose, UA NEGATIVE NEGATIVE mg/dL   Bilirubin Urine NEGATIVE NEGATIVE   Ketones, ur NEGATIVE NEGATIVE mg/dL   Specific Gravity, Urine 1.006 1.005 - 1.030   Hgb urine dipstick NEGATIVE NEGATIVE   pH 6.0 5.0 - 8.0   Protein, ur NEGATIVE NEGATIVE mg/dL   Nitrite NEGATIVE NEGATIVE   Leukocytes, UA NEGATIVE NEGATIVE   RBC / HPF NONE SEEN 0 - 5 RBC/hpf   WBC, UA 0-5 0 - 5 WBC/hpf   Bacteria, UA NONE SEEN NONE SEEN   Squamous Epithelial / LPF NONE SEEN NONE SEEN   Mucous PRESENT   Urine Drug Screen, Qualitative ([facility] only)     Status: None   Collection Time: 11/08/15  3:55 AM  Result Value Ref Range   Tricyclic, Ur Screen NONE  DETECTED NONE DETECTED   Amphetamines, Ur Screen NONE DETECTED NONE DETECTED   MDMA (Ecstasy)Ur Screen NONE DETECTED NONE DETECTED   Cocaine Metabolite,Ur [place] NONE DETECTED NONE DETECTED   Opiate, Ur Screen NONE DETECTED NONE DETECTED    Phencyclidine (PCP) Ur S NONE DETECTED NONE DETECTED   Cannabinoid 50 Ng, Ur [place] NONE DETECTED NONE DETECTED   Barbiturates, Ur Screen NONE DETECTED NONE DETECTED   Benzodiazepine, Ur Scrn NONE DETECTED NONE DETECTED   Methadone Scn, Ur NONE DETECTED NONE DETECTED    Comment: (NOTE) 106  Tricyclics, urine               Cutoff 1000 ng/mL 200  Amphetamines, urine             Cutoff 1000 ng/mL 300  MDMA (Ecstasy), urine           Cutoff 500 ng/mL 400  Cocaine Metabolite, urine       Cutoff 300 ng/mL 500  Opiate, urine                   Cutoff 300 ng/mL 600  Phencyclidine (PCP), urine      Cutoff 25 ng/mL 700  Cannabinoid, urine              Cutoff 50 ng/mL 800  Barbiturates, urine             Cutoff 200 ng/mL 900  Benzodiazepine, urine           Cutoff 200 ng/mL 1000 Methadone, urine                Cutoff 300 ng/mL 1100 1200 The urine drug screen provides only a preliminary, unconfirmed 1300 analytical test result and should not be used for non-medical 1400 purposes. Clinical consideration and professional judgment should 1500 be applied to any positive drug screen result due to possible 1600 interfering substances. A more specific alternate chemical method 1700 must be used in order to obtain a confirmed analytical result.  1800 Gas chromato graphy / mass spectrometry (GC/MS) is the preferred 1900 confirmatory method.     No current facility-administered medications for this encounter.   Current Outpatient Prescriptions  Medication Sig Dispense Refill  . amLODipine (NORVASC) 2.5 MG tablet Take 2.5 mg by mouth daily.    Marland Kitchen buPROPion (WELLBUTRIN XL) 300 MG 24 hr tablet Take 300 mg by mouth daily.    . clonazePAM (KLONOPIN) 1 MG tablet Take 1-2 mg by mouth See admin instructions. 1 tablet every morning, 1 tablet every afternoon, and 2 tablets at bedtime    . divalproex (DEPAKOTE ER) 500 MG 24 hr tablet Take 2,000 mg by mouth at bedtime.    Marland Kitchen escitalopram (LEXAPRO) 20 MG tablet Take 20  mg by mouth every morning.    Marland Kitchen esomeprazole (NEXIUM) 40 MG capsule Take 40 mg by mouth daily.     . hydrOXYzine (ATARAX/VISTARIL) 50 MG tablet Take 50 mg by mouth 3 (three) times daily as needed for anxiety.    . Melatonin 5 MG TABS Take 10 mg by mouth at bedtime.    . meloxicam (MOBIC) 15 MG tablet Take 15 mg by mouth daily.    . Multiple Vitamins-Minerals (THEREMS-M) TABS Take 1 tablet by mouth daily.    . traZODone (DESYREL) 100 MG tablet Take 300 mg by mouth at bedtime.    Marland Kitchen levofloxacin (LEVAQUIN) 750 MG tablet Take 1 tablet (750 mg total) by mouth daily. (Patient not taking: Reported on 11/08/2015) 5 tablet  0    Musculoskeletal: Strength & Muscle Tone: within normal limits Gait & Station: normal Patient leans: N/A  Psychiatric Specialty Exam: Review of Systems  Genitourinary: Positive for frequency.  Endo/Heme/Allergies: Positive for polydipsia.       Frequent nocturia.   Psychiatric/Behavioral: Positive for depression, suicidal ideas and memory loss. Negative for hallucinations and substance abuse. The patient has insomnia. The patient is not nervous/anxious.   All other systems reviewed and are negative.   Blood pressure 120/96, pulse 54, temperature 98.6 F (37 C), temperature source Oral, resp. rate 20, height '5\' 11"'$  (1.803 m), weight 92.987 kg (205 lb), SpO2 100 %.Body mass index is 28.6 kg/(m^2).  General Appearance: Casual and Fairly Groomed  Engineer, water::  Fair  Speech:  Clear and Coherent and Normal Rate  Volume:  Normal  Mood:  Depressed and Hopeless  Affect:  Restricted  Thought Process:  Coherent, Goal Directed, Intact, Linear and Logical  Orientation:  Full (Time, Place, and Person)  Thought Content:  Negative  Suicidal Thoughts:  Yes.  with intent/plan  Homicidal Thoughts:  No  Memory:  Immediate;   Good Recent;   Good Remote;   Good  Judgement:  Fair  Insight:  Fair  Psychomotor Activity:  Normal  Concentration:  Good  Recall:  Good  Fund of  Knowledge:Good  Language: Good  Akathisia:  Negative  Handed:  Right  AIMS (if indicated):     Assets:  Communication Skills Desire for Improvement Financial Resources/Insurance Housing  ADL's:  Intact  Cognition: WNL  Sleep:       SUICIDE RISK ASSESSMENT   ED Presentation  1.  Current admission precipitated by suicide attempt? No [1]  2.  Suicide Attempt History? Past attempts of high lethality [2]  3.  Suicidal Ideation? Constant suicidal thought [2]  4.  Suicide Plan? Has plan with actual OR potential access to planned method [2]  5.  Plan Lethality? Highly lethal plan (Carbon monoxide, gun, hanging, jumping) [2]  6.  Safety Plan Agreement? Unwilling OR unable to agree due to impaired reality testing [2]  7.  Current Morbid Thoughts (reunion, fantasies, preoccupations with death)? Frequently [1]  8.  Elopement Risk? Low risk [0]  9.  The following symptoms were present during assessment: Hopeless, Helpless, Anhedonia, Guilt/shame, Anxiety, Insomnia and Agitation, 5-6 symptoms present [2]  TOTAL SCORE: 12  Scoring Key: 10 or higher = Imminent Risk (consider 1:1) 4 - 9 = Moderate Risk (consider q 15 minute observation)Attended alcohol, tobacco, prescription and other drug psychoeducation group.  0 - 3 = Low Risk (consider q 30 minute observation)  ------------------------------------------------------------------------------------------------------------------ PLEASE ADDRESS THE FOLLOWING 5 ISSUES:  1.  Physician's Subjective Appraisal of Risk (check one): Patient replies appear trustworthy  2.  Family History of Suicide?  no  3.  Protective measures (select all that apply): Agrees to treatment plan and follow up  4.  High Risk Diagnoses (select all that apply): Depression/Bipolar Disorder  5.  Dangerousness Assessment Suicide  Risk factors reviewed and risk assessed to be: High  Protection factors reviewed and risk assessed to be: High  Response to  treatment and risk assessed to be: High  Support reviewed and risk assessed to be: High  Acceptance of discharge and outpatient treatment plan reviewed and risk assessed to be: High  Overall risk assessed to be: High    Treatment Plan Summary: Medication management  Disposition: Recommend psychiatric Inpatient admission when medically cleared.   39 y.o. male who likely has MDD,  recurrent, severe without psychotic features. He discussed a history of frequent loss including the death of his mother & father. He discussed isolation as he has no siblings or family remaining. He lives in a group home which he does not like & I reiterated we would not be able to find him another group home. Pt voiced understanding.. Pt also denies a h/o symptoms consistent with mania which, at this time, makes the diagnosis of bipolar disorder less likely. Pt expressed feeling hopeless, helpless and worthless. He also endorses reunion fantasies. He is medication non-compliant but asking for benzodiazepines to help calm his nerves. Ethanol level on admission was undetectable and UDS was negative for all substances. Pt will be IVCd with placement at an appropriate psychiatric facility for inpatient treatment.   Plan:  1. Pseudoseizures: worsening  - Continue to monitor.  - Medical work-up complete.   - pt has a home med order for clonazepam '1mg'$  1po Qam, 1po Qafternoon and 2 po QHS. (will need to monitor for withdrawals. Will also try to determine if patient takes this medication regularly).   - Please keep in mind one of the most common causes of pseudoseizures is seizures. 2. MDD, recurrent, severe without psychotic features: worsening   - Restart home bupropion XL but will start at '150mg'$  po Qam. Monitor for worsening anxiety.   - restart home escitalopram '20mg'$  po QD.   - Will not restart valproic acid as patient is not taking this medication regularly.  3. Insomnia: worsening and anxiety   - will start home  hydroxyzine at '50mg'$  po BID/prn for anxiety and '50mg'$  po QHS for insomnia.  4. Restart home medications but will hold trazodone, valproic acid and clonazepam.  5. IVC completed and given to appropriate staff for dissemination.  6. R/B/Se discussed including but not limited to any and all black box warnings.  7. ED and SW updated on current treatment plan.  8. Pt should follow-up or be worked up for hyperglycemia, nocturia and polydipsia. 9. Pt may benefit from ACTT due to medication non-compliance.   Donita Brooks 11/08/2015 9:57 AM

## 2015-11-08 NOTE — ED Notes (Signed)
IVC. Rec admission. Pending placement

## 2015-11-08 NOTE — ED Notes (Signed)
Charge nurse aware pt is currently feeling suicidal; pt to move to different room for closer observation

## 2015-11-08 NOTE — ED Provider Notes (Signed)
-----------------------------------------   10:38 PM on 11/08/2015 -----------------------------------------  Patient complaining of third right toe pain. X-rays were obtained and show any acute fracture per my read. Asked nurse to put patient in hardsole shoe.  Phineas SemenGraydon Rider Ermis, MD 11/08/15 2239

## 2015-11-08 NOTE — Progress Notes (Signed)
Disposition CSW completed patient referrals to the following inpatient psych facilities:  [facility]  CSW will continue to follow patient for placement needs.  Seward SpeckLeo Jadalynn Burr [facility] Hospital CenterCSW,LCAS Behavioral Health Disposition CSW [phone]

## 2015-11-08 NOTE — ED Notes (Signed)

## 2015-11-08 NOTE — ED Notes (Signed)
BEHAVIORAL HEALTH ROUNDING Patient sleeping: No. Patient alert and oriented: yes Behavior appropriate: Yes.  ; If no, describe:  Nutrition and fluids offered: Yes Toileting and hygiene offered: Yes  Sitter present: yes Law enforcement present: Yes ODS  

## 2015-11-08 NOTE — ED Provider Notes (Signed)
[facility] Emergency Department Provider Note  ____________________________________________  Time seen: Approximately 2:43 AM  I have reviewed the triage vital signs and the nursing notes.   HISTORY  Chief Complaint Seizures   HPI [name] is a 39 y.o. male who lives at a group home and has been seen in the past for psychiatric visitsand has diagnoses of depression and anxiety and "seizures caused by stress".  He reportedly takes Depakote, Klonopin, and Wellbutrin.  EMS brought him in after the group home called them for the patient having a seizure.  It had resolved when EMS arrived but they note that he started having a seizure during transport to the ED.  It had resolved by the time that he arrived here and he did not get any medications.  Although reportedly he was somewhat somnolent after the seizure episode with EMS he is fully awake and alert and oriented upon arrival to the ED.  He states he has been in his normal state of health recently.  He is stressed because he does not like his group home.  He denies fever/chills, chest pain, shortness of breath, abdominal pain, nausea or vomiting.  He reports a headache.  He made one mention of "every bone in my body hurts" but thereafter he denied any pain other than his head.  He reports that he has been taking his medication as he is supposed to including his Depakote.   Past Medical History  Diagnosis Date  . Depression   . Anxiety   . Hypertension   . GERD (gastroesophageal reflux disease)   . Seizures [facility])     Patient Active Problem List   Diagnosis Date Noted  . Aspiration pneumonia (HCC) 09/05/2015  . Altered mental status 09/05/2015  . Acute respiratory failure with hypoxia (HCC) 09/03/2015  . Overdose 09/03/2015  . Depression 09/03/2015  . Suicide attempt by drug ingestion (HCC) 11/05/2013  . Insulin overdose 11/05/2013  . Alcohol abuse 11/05/2013  . Seizure disorder (HCC) 11/05/2013   . Hypophosphatemia 11/05/2013  . Tobacco abuse 11/05/2013  . Situational depression 11/05/2013  . Rib pain 11/05/2013  . HTN (hypertension) 11/05/2013  . Hypoglycemia 11/05/2013    Past Surgical History  Procedure Laterality Date  . Finger surgery Left     5th digit    Current Outpatient Rx  Name  Route  Sig  Dispense  Refill  . amLODipine (NORVASC) 2.5 MG tablet   Oral   Take 2.5 mg by mouth daily.         Marland Kitchen buPROPion (WELLBUTRIN XL) 300 MG 24 hr tablet   Oral   Take 300 mg by mouth daily.         . clonazePAM (KLONOPIN) 1 MG tablet   Oral   Take 1-2 mg by mouth See admin instructions. 1 tablet every morning, 1 tablet every afternoon, and 2 tablets at bedtime         . divalproex (DEPAKOTE ER) 500 MG 24 hr tablet   Oral   Take 2,000 mg by mouth at bedtime.         Marland Kitchen escitalopram (LEXAPRO) 20 MG tablet   Oral   Take 20 mg by mouth every morning.         Marland Kitchen esomeprazole (NEXIUM) 40 MG capsule   Oral   Take 40 mg by mouth daily.          . hydrOXYzine (ATARAX/VISTARIL) 50 MG tablet   Oral   Take 50 mg  by mouth 3 (three) times daily as needed for anxiety.         . Melatonin 5 MG TABS   Oral   Take 10 mg by mouth at bedtime.         . meloxicam (MOBIC) 15 MG tablet   Oral   Take 15 mg by mouth daily.         . Multiple Vitamins-Minerals (THEREMS-M) TABS   Oral   Take 1 tablet by mouth daily.         . traZODone (DESYREL) 100 MG tablet   Oral   Take 300 mg by mouth at bedtime.         Marland Kitchen levofloxacin (LEVAQUIN) 750 MG tablet   Oral   Take 1 tablet (750 mg total) by mouth daily. Patient not taking: Reported on 11/08/2015   5 tablet   0     Allergies Review of patient's allergies indicates no known allergies.  Family History  Problem Relation Age of Onset  . CAD Mother   . Diabetes Mellitus II Father     Social History Social History  Substance Use Topics  . Smoking status: Current Every Day Smoker -- 0.50 packs/day     Types: Cigarettes  . Smokeless tobacco: None  . Alcohol Use: No     Comment: pt states he no longer drinks    Review of Systems Constitutional: No fever/chills Eyes: No visual changes. ENT: No sore throat. Cardiovascular: Denies chest pain. Respiratory: Denies shortness of breath. Gastrointestinal: No abdominal pain.  No nausea, no vomiting.  No diarrhea.  No constipation. Genitourinary: Negative for dysuria. Musculoskeletal: Negative for back pain. Skin: Negative for rash. Neurological: Headache.  No focal weakness or numbness.  Reportedly has had 2 generalized seizures.  10-point ROS otherwise negative.  ____________________________________________   PHYSICAL EXAM:  ED Triage Vitals  Enc Vitals Group     BP 11/08/15 0258 120/96 mmHg     Pulse Rate 11/08/15 0258 54     Resp 11/08/15 0258 20     Temp 11/08/15 0258 98.6 F (37 C)     Temp Source 11/08/15 0258 Oral     SpO2 11/08/15 0258 100 %     Weight 11/08/15 0258 205 lb (92.987 kg)     Height 11/08/15 0258  (1.803 m)     Head Cir --      Peak Flow --      Pain Score 11/08/15 0242 8     Pain Loc --      Pain Edu? --      Excl. in GC? --     Constitutional: Alert and oriented. No acute distress. Eyes: Conjunctivae are normal. PERRL. EOMI. Head: Atraumatic. Nose: No congestion/rhinnorhea. Mouth/Throat: Mucous membranes are moist.  Oropharynx non-erythematous. Neck: No stridor.  No cervical spine tenderness to palpation.  No meningismus. Cardiovascular: Normal rate, regular rhythm. Grossly normal heart sounds.  Good peripheral circulation. Respiratory: Normal respiratory effort.  No retractions. Lungs CTAB. Gastrointestinal: Soft and nontender. No distention. No abdominal bruits. No CVA tenderness. Musculoskeletal: No lower extremity tenderness nor edema.  No joint effusions. Neurologic:  Normal speech and language. No gross focal neurologic deficits are appreciated.  Skin:  Skin is warm, dry and intact. No  rash noted. Psychiatric: Mood and affect are flat.  Initially patient stated no psychiatric concerns, but later he endorsed worsening depression and suicidal ideation, stating that he hates his group home, they are mean, and he wants to kill himself.  ____________________________________________   LABS (all labs ordered are listed, but only abnormal results are displayed)  Labs Reviewed  COMPREHENSIVE METABOLIC PANEL - Abnormal; Notable for the following:    Total Protein 8.8 (*)    All other components within normal limits  URINALYSIS COMPLETEWITH MICROSCOPIC ([facility] ONLY) - Abnormal; Notable for the following:    Color, Urine STRAW (*)    APPearance CLEAR (*)    All other components within normal limits  VALPROIC ACID LEVEL - Abnormal; Notable for the following:    Valproic Acid Lvl <10 (*)    All other components within normal limits  GLUCOSE, CAPILLARY - Abnormal; Notable for the following:    Glucose-Capillary 106 (*)    All other components within normal limits  CBC WITH DIFFERENTIAL/PLATELET  ETHANOL  URINE DRUG SCREEN, QUALITATIVE ([facility] ONLY)   ____________________________________________  EKG  Not indicated ____________________________________________  RADIOLOGY  No results found.  ____________________________________________   PROCEDURES  Procedure(s) performed: None  Critical Care performed: No ____________________________________________   INITIAL IMPRESSION / ASSESSMENT AND PLAN / ED COURSE  Pertinent labs & imaging results that were available during my care of the patient were reviewed by me and considered in my medical decision making (see chart for details).  I strongly doubted true epileptic seizures upon arrival given how quickly his mental status improved.  He has an odd affect but he also does have a psychiatric history.  Shortly after her return to my desk and was told that he was having additional seizure-like activity.  I spoke to him laterally  and told him to stop and he briefly looked at me but his activity continued.  I gave him a risk sternal rub and the seizure-like activity immediately stopped and he was speaking to me in clear and coherent sentences.  I am not concerned about epileptic seizure activity.  The patient was not telling the truth about his Depakote; his valproic acid acid level is undetectable.  No acute medical conditions at this time.  Unfortunately he is now claiming suicidality.  Given his medical history including depression I will obtain a psychiatric consult but I have a very low concern that he is a danger to himself and I will not place him under involuntary commitment.  If he decides that he is ready to go back to his group home he may be discharged safely.  I explained to them that we are not willing or able to place him in a different group home and that once the psychiatrist determines he is safe for discharge he will need to return to his group home.  He states that he understands.  ____________________________________________  FINAL CLINICAL IMPRESSION(S) / ED DIAGNOSES  Final diagnoses:  Pseudoseizures  Depression      NEW MEDICATIONS STARTED DURING THIS VISIT:  New Prescriptions   No medications on file     Loleta Roseory Danene Montijo, MD 11/08/15 [phone]

## 2015-11-08 NOTE — ED Notes (Signed)
Report called to Ruthie, Charity fundraiserN.

## 2015-11-08 NOTE — ED Notes (Signed)
Pt visualized in NAD. Pt finished breakfast tray. Pt resting in bed with eyes closed at this time. Pt able to be aroused with mild stimulation at this time.

## 2015-11-08 NOTE — ED Notes (Signed)
Pt began having full body shakes; MD to bedside; MD sternal rubbed pt and he stopped, fully awake and answering questions;

## 2015-11-08 NOTE — ED Notes (Addendum)
Pt. Moved from Room #18 to room # 22.

## 2015-11-08 NOTE — ED Notes (Addendum)
EMS pt from [facility] following seizure; BP 150/80, HR 90's, oxygen 98% per EMS; unable to establish IV-pt began to have another seizure while paramedic attempting to stick; seizure resolved on it's own; pt arrives awake and alert; talking in complete coherent sentences; EMS reports pt with history of seizures; pt adds at the end of triage he'd like to be admitted to behavioral medicine; says he's been depressed with intermittent suicidal thoughts; denies plan; pt says currently he's feeling suicidal

## 2015-11-08 NOTE — BH Assessment (Signed)
Assessment Note  [name] is a 39 y.o. male presenting to the ED initially for a medical condition but later reported that he was depressed and having suicidal thoughts.  Pt reports he does not like living in his group home and has no friends or family for support.  He reported he was initially suicidal with no plan but does not feel suicidal anymore.  Pt denies HI and any auditory/visual hallucinations.  Pt denies any drug use.   Diagnosis: Depression  Past Medical History:  Past Medical History  Diagnosis Date  . Depression   . Anxiety   . Hypertension   . GERD (gastroesophageal reflux disease)   . Seizures [facility](HCC)     Past Surgical History  Procedure Laterality Date  . Finger surgery Left     5th digit    Family History:  Family History  Problem Relation Age of Onset  . CAD Mother   . Diabetes Mellitus II Father     Social History:  reports that he has been smoking Cigarettes.  He has been smoking about 0.50 packs per day. He does not have any smokeless tobacco history on file. He reports that he does not drink alcohol or use illicit drugs.  Additional Social History:  Alcohol / Drug Use History of alcohol / drug use?: No history of alcohol / drug abuse  CIWA: CIWA-Ar BP: (!) 120/96 mmHg Pulse Rate: (!) 54 COWS:    Allergies: No Known Allergies  Home Medications:  (Not in a hospital admission)  OB/GYN Status:  No LMP for male patient.  General Assessment Data [facility] Assessment: In system Is this a Tele or Face-to-Face Assessment?: Face-to-Face Is this an Initial Assessment or a Re-assessment for this encounter?: Initial Assessment Marital status: Single Maiden name: N/A Is patient pregnant?: No Pregnancy Status: No Living Arrangements: Group Home ([facility]) Can pt return to current living arrangement?: Yes Admission Status: Voluntary Is patient capable of signing voluntary admission?: Yes Referral Source:  Self/Family/Friend Insurance type: Medicare  Medical Screening Exam [facility] ONLY) Medical Exam completed: Yes  Crisis Care Plan Living Arrangements: Group Home ([facility]) Legal Guardian: Other: (Self) Name of Psychiatrist: [facility] Name of Therapist: [facility]  Education Status Is patient currently in school?: No Current Grade: N/A Highest grade of school patient has completed: N/A Name of school: N/A Contact person: N/A  Risk to self with the past 6 months Suicidal Ideation: Yes-Currently Present Has patient been a risk to self within the past 6 months prior to admission? : No Suicidal Intent: No Has patient had any suicidal intent within the past 6 months prior to admission? : No Is patient at risk for suicide?: No Suicidal Plan?: No Has patient had any suicidal plan within the past 6 months prior to admission? : No Access to Means: No What has been your use of drugs/alcohol within the last 12 months?: None reported Previous Attempts/Gestures: Yes How many times?: 1 Other Self Harm Risks: None reported Triggers for Past Attempts: None known Intentional Self Injurious Behavior: None Family Suicide History: No Recent stressful life event(s): Conflict (Comment) (conflict at group home) Persecutory voices/beliefs?: No Depression: Yes Depression Symptoms: Loss of interest in usual pleasures, Feeling worthless/self pity Substance abuse history and/or treatment for substance abuse?: No Suicide prevention information given to non-admitted patients: Not applicable  Risk to Others within the past 6 months Homicidal Ideation: No Does patient have any lifetime risk of violence toward others beyond the six  months prior to admission? : No Thoughts of Harm to Others: No Current Homicidal Intent: No Current Homicidal Plan: No Access to Homicidal Means: No Identified Victim: None identified History of harm to others?: No Assessment of Violence: None Noted Violent Behavior  Description: None reported Does patient have access to weapons?: No Criminal Charges Pending?: No Does patient have a court date: No Is patient on probation?: No  Psychosis Hallucinations: None noted Delusions: None noted  Mental Status Report Appearance/Hygiene: In scrubs Eye Contact: Good Motor Activity: Freedom of movement, Unremarkable Speech: Logical/coherent Level of Consciousness: Alert Mood: Depressed Affect: Depressed Anxiety Level: None Thought Processes: Coherent Judgement: Unimpaired Orientation: Person, Place, Time, Situation Obsessive Compulsive Thoughts/Behaviors: None  Cognitive Functioning Concentration: Normal Memory: Recent Intact, Remote Intact IQ: Average Insight: Fair Impulse Control: Fair Appetite: Good Weight Loss: 0 Weight Gain: 0 Sleep: No Change Total Hours of Sleep: 8 Vegetative Symptoms: None  ADLScreening [facility] Assessment Services) Patient's cognitive ability adequate to safely complete daily activities?: Yes Patient able to express need for assistance with ADLs?: Yes Independently performs ADLs?: Yes (appropriate for developmental age)  Prior Inpatient Therapy Prior Inpatient Therapy: Yes Prior Therapy Dates: 08/2015 Prior Therapy Facilty/Provider(s): [facility] Reason for Treatment: depression  Prior Outpatient Therapy Prior Outpatient Therapy: Yes Prior Therapy Dates: current Prior Therapy Facilty/Provider(s): [facility] Reason for Treatment: depression Does patient have an ACCT team?: Yes Does patient have Intensive In-House Services?  : No Does patient have [facility] services? : No Does patient have P4CC services?: No  ADL Screening (condition at time of admission) Patient's cognitive ability adequate to safely complete daily activities?: Yes Patient able to express need for assistance with ADLs?: Yes Independently performs ADLs?: Yes (appropriate for developmental age)       Abuse/Neglect Assessment (Assessment to be complete while  patient is alone) Physical Abuse: Denies Verbal Abuse: Denies Sexual Abuse: Denies Exploitation of patient/patient's resources: Denies Self-Neglect: Denies Values / Beliefs Cultural Requests During Hospitalization: None Spiritual Requests During Hospitalization: None Consults Spiritual Care Consult Needed: No Social Work Consult Needed: No Merchant navy officer (For Healthcare) Does patient have an advance directive?: No Would patient like information on creating an advanced directive?: No - patient declined information    Additional Information 1:1 In Past 12 Months?: No CIRT Risk: No Elopement Risk: No Does patient have medical clearance?: Yes     Disposition:  Disposition Initial Assessment Completed for this Encounter: Yes Disposition of Patient: Other dispositions Other disposition(s): Other (Comment) (Psych MD consult)  On Site Evaluation by:   Reviewed with Physician:    Aniesa Boback C Masai Kidd 11/08/2015 4:04 AM

## 2015-11-08 NOTE — ED Notes (Signed)
Pt placed on IVC by Dr Jenne CampusMcQueen, no exam needed by ER MD.  Pending placement

## 2015-11-08 NOTE — ED Notes (Signed)
BEHAVIORAL HEALTH ROUNDING Patient sleeping: No. Patient alert and oriented: yes Behavior appropriate: Yes.  ; If no, describe:  Nutrition and fluids offered: Yes  Toileting and hygiene offered: Yes  Sitter present: no Law enforcement present: Yes  

## 2015-11-08 NOTE — ED Notes (Signed)
Pt c/o pain to middle toes of R foot. Pt states that he hit them during a seizure. Bruising noted but no swelling. MD notified at this time. No new orders received.

## 2015-11-08 NOTE — ED Notes (Signed)
Snack meal and soda beverage provided by this RN. Pt observed with no unusual behavior. Appropriate to stimulation. Pt did not verbalize any other needs or concerns at this time. NAD assessed. Will continue to monitor.  

## 2015-11-09 DIAGNOSIS — F332 Major depressive disorder, recurrent severe without psychotic features: Secondary | ICD-10-CM | POA: Diagnosis not present

## 2015-11-09 DIAGNOSIS — R569 Unspecified convulsions: Secondary | ICD-10-CM | POA: Diagnosis not present

## 2015-11-09 MED ORDER — TRAZODONE HCL 100 MG PO TABS
ORAL_TABLET | ORAL | Status: AC
  Filled 2015-11-09: qty 2

## 2015-11-09 MED ORDER — ACETAMINOPHEN 500 MG PO TABS
1000.0000 mg | ORAL_TABLET | Freq: Three times a day (TID) | ORAL | Status: DC
  Administered 2015-11-09 – 2015-11-11 (×5): 1000 mg via ORAL
  Filled 2015-11-09 (×5): qty 2

## 2015-11-09 MED ORDER — TRAMADOL HCL 50 MG PO TABS
ORAL_TABLET | ORAL | Status: AC
  Administered 2015-11-09: 100 mg via ORAL
  Filled 2015-11-09: qty 2

## 2015-11-09 MED ORDER — TRAMADOL HCL 50 MG PO TABS
100.0000 mg | ORAL_TABLET | Freq: Once | ORAL | Status: AC
  Administered 2015-11-09: 100 mg via ORAL

## 2015-11-09 MED ORDER — TRAZODONE HCL 100 MG PO TABS
200.0000 mg | ORAL_TABLET | Freq: Every evening | ORAL | Status: DC | PRN
  Administered 2015-11-09 – 2015-11-10 (×2): 200 mg via ORAL
  Filled 2015-11-09: qty 2

## 2015-11-09 MED ORDER — DIVALPROEX SODIUM ER 500 MG PO TB24
750.0000 mg | ORAL_TABLET | Freq: Every day | ORAL | Status: DC
  Administered 2015-11-09 – 2015-11-10 (×2): 750 mg via ORAL
  Filled 2015-11-09 (×2): qty 1

## 2015-11-09 MED ORDER — IBUPROFEN 600 MG PO TABS
600.0000 mg | ORAL_TABLET | Freq: Four times a day (QID) | ORAL | Status: DC | PRN
  Administered 2015-11-09 (×2): 600 mg via ORAL
  Filled 2015-11-09: qty 1

## 2015-11-09 MED ORDER — CLONAZEPAM 0.5 MG PO TABS
0.5000 mg | ORAL_TABLET | Freq: Three times a day (TID) | ORAL | Status: DC
  Administered 2015-11-09 – 2015-11-11 (×6): 0.5 mg via ORAL
  Filled 2015-11-09 (×6): qty 1

## 2015-11-09 MED ORDER — IBUPROFEN 600 MG PO TABS
ORAL_TABLET | ORAL | Status: AC
  Administered 2015-11-09: 600 mg via ORAL
  Filled 2015-11-09: qty 1

## 2015-11-09 NOTE — ED Provider Notes (Signed)
-----------------------------------------   7:06 AM on 11/09/2015 -----------------------------------------   Blood pressure 131/68, pulse 56, temperature 98.7 F (37.1 C), temperature source Oral, resp. rate 18, height 5\' 11"  (1.803 m), weight 205 lb (92.987 kg), SpO2 100 %.  The patient had no acute events since last update.  Calm and cooperative at this time.  Plan to admit to inpatient psych.     Rebecka ApleyAllison P Thuan Tippett, MD 11/09/15 [phone]

## 2015-11-09 NOTE — Progress Notes (Signed)
Per Brandy HaleUzma Faheem, MD, patient meets criteria for inpatient psych treatment.  Referral has been followed up at the following facilities: [facility], will give you a call back when we take a look at it.  [facility] - left voicemail. [facility] - per intake, refax referral. [facility] - per Raynelle FanningJulie, refax referral.  [facility] - per Lynden Angathy, fax referral for review. [facility] - per Siri ColeHerbert, adult and geriatric beds, fax referral.  At capacity: [facility] - per Darvin Ne[facility]bourson [facility] - per Lovey NewcomerHeather [facility] - per [facility]   [facility], [facility], and [facility] don't take [place], therefore patient can't be referred to these facilities.  CSW will continue to follow up with referral.  [place] Abtsatia Phu Record, LCSWA Disposition staff 11/09/2015 4:06 PM

## 2015-11-09 NOTE — ED Notes (Signed)
Patient resting quietly in room. No noted distress or abnormal behaviors noted. Will continue 15 minute checks and observation by security camera for safety. 

## 2015-11-09 NOTE — ED Notes (Signed)
[facility] received from [facility]. Pt. Alert and oriented in no acute distress denies HI, AVH. Pt. States he still has SI to jump in front of a train. Pt. Instructed to come to me with problems or concerns.Will continue to monitor for safety via security cameras and Q 15 minute checks.

## 2015-11-09 NOTE — BH Assessment (Addendum)
[facility]Tina[facility] [place], [place] appropriate [place].  Per Press photographerCharge Nurse Dedra Skeens(Gwen) [facility] [place] the unit is at capacity.   Writer informed the nurse (Amy) working with patient.  CSW [place] will assist with placement.

## 2015-11-09 NOTE — ED Notes (Signed)
Pt eating lunch.  No noted distress or abnormal behaviors noted. Will continue 15 minute checks and observation by security camera for safety.

## 2015-11-09 NOTE — ED Notes (Signed)
Pt rates anxiety 7/10. Pt stated his anxiety has been elevated ever since the death of his parents. Pt is preoccupied with his medications, always wanting to know what med he can have next (especially anxiety meds). Already concerned he does not have trazodone ordered for tonight. Remains cooperative. No noted distress or abnormal behaviors noted. Will continue 15 minute checks and observation by security camera for safety.

## 2015-11-09 NOTE — ED Notes (Signed)
Pt. Noted in room. No complaints or concerns voiced. No distress or abnormal behavior noted. Will continue to monitor with security cameras. Q 15 minute rounds continue. 

## 2015-11-09 NOTE — ED Notes (Signed)
Pt given ice for broken toe. Patient resting quietly in room. No noted distress or abnormal behaviors noted. Will continue 15 minute checks and observation by security camera for safety.

## 2015-11-09 NOTE — ED Notes (Signed)
Call placed to [facility] to assist with recommended inpatient hospitalization. They are unable to assist with getting patients to [facility].

## 2015-11-09 NOTE — ED Notes (Signed)
BEHAVIORAL HEALTH ROUNDING Patient sleeping: No. Patient alert and oriented: yes Behavior appropriate: Yes.  ; If no, describe:   Nutrition and fluids offered: Yes  Toileting and hygiene offered: Yes  Sitter present: no Law enforcement present: Yes  and ODS  

## 2015-11-09 NOTE — ED Notes (Signed)
BEHAVIORAL HEALTH ROUNDING Patient sleeping: No. Patient alert and oriented: yes Behavior appropriate: Yes.  ; If no, describe:   Nutrition and fluids offered: Yes  Toileting and hygiene offered: Yes  Sitter present: no Law enforcement present: Yes  and ODS  ED BHU PLACEMENT JUSTIFICATION Is the patient under IVC or is there intent for IVC: Yes.   Is the patient medically cleared: Yes.   Is there vacancy in the ED BHU: Yes.   Is the population mix appropriate for patient: Yes.   Is the patient awaiting placement in inpatient or outpatient setting: Yes.   Has the patient had a psychiatric consult: Yes.   Survey of unit performed for contraband, proper placement and condition of furniture, tampering with fixtures in bathroom, shower, and each patient room: Yes.  ; Findings: all clear APPEARANCE/BEHAVIOR calm, cooperative and adequate rapport can be established NEURO ASSESSMENT Orientation: time, place and person Hallucinations: No.None noted (Hallucinations) Speech: Normal Gait: normal RESPIRATORY ASSESSMENT Normal expansion.  Clear to auscultation.  No rales, rhonchi, or wheezing. CARDIOVASCULAR ASSESSMENT regular rate and rhythm, S1, S2 normal, no murmur, click, rub or gallop GASTROINTESTINAL ASSESSMENT soft, nontender, BS WNL, no r/g EXTREMITIES normal strength, tone, and muscle mass, no deformities, ROM of all joints is normal, gait was normal for age PLAN OF CARE Provide calm/safe environment. Vital signs assessed twice daily. ED BHU Assessment once each 12-hour shift. Collaborate with intake RN daily or as condition indicates. Assure the ED provider has rounded once each shift. Provide and encourage hygiene. Provide redirection as needed. Assess for escalating behavior; address immediately and inform ED provider.  Assess family dynamic and appropriateness for visitation as needed: Yes.  ; If necessary, describe findings:   Educate the patient/family about BHU  procedures/visitation: Yes.  ; If necessary, describe findings:    ENVIRONMENTAL ASSESSMENT Potentially harmful objects out of patient reach: Yes.   Personal belongings secured: Yes.   Patient dressed in hospital provided attire only: Yes.   Plastic bags out of patient reach: Yes.   Patient care equipment (cords, cables, call bells, lines, and drains) shortened, removed, or accounted for: Yes.   Equipment and supplies removed from bottom of stretcher: Yes.   Potentially toxic materials out of patient reach: Yes.   Sharps container removed or out of patient reach: Yes.    

## 2015-11-09 NOTE — ED Notes (Signed)
Pt. Noted sleeping in room. No complaints or concerns voiced. No distress or abnormal behavior noted. Will continue to monitor with security cameras. Q 15 minute rounds continue. 

## 2015-11-09 NOTE — ED Notes (Signed)
Pt c/o pain in broken toe. MD consulted. See MAR. Pt continuously asking about anxiety and sleep medications. Anxiety still rated a 7/10. Will continue 15 minute checks and observation by security camera for safety.

## 2015-11-09 NOTE — ED Notes (Signed)
BEHAVIORAL HEALTH ROUNDING Patient sleeping: Yes.   Patient alert and oriented: not applicable Behavior appropriate: Yes.  ; If no, describe:   Nutrition and fluids offered: No Toileting and hygiene offered: No Sitter present: no Law enforcement present: Yes  and ODS    

## 2015-11-09 NOTE — Consult Note (Signed)
[facility] Consult   Reason for Consult:  Follow-up Referring Physician:  Hinda Kehr, MD  Patient Identification: [name] MRN:  [id number]   Principal Diagnosis: MDD (major depressive disorder), recurrent severe, without psychosis ([place])    Diagnosis:   Patient Active Problem List   Diagnosis Date Noted  . MDD (major depressive disorder), recurrent severe, without psychosis ([place]) [F33.2] 11/08/2015  . Aspiration pneumonia ([place]) [J69.0] 09/05/2015  . Altered mental status [R41.82] 09/05/2015  . Acute respiratory failure with hypoxia ([place]) [J96.01] 09/03/2015  . Overdose [T50.901A] 09/03/2015  . Depression [F32.9] 09/03/2015  . Suicide attempt by drug ingestion ([place]) [T50.902A] 11/05/2013  . Insulin overdose [T38.3X1A] 11/05/2013  . Alcohol abuse [F10.10] 11/05/2013  . Seizure disorder ([place]) [P80.998] 11/05/2013  . Hypophosphatemia [E83.39] 11/05/2013  . Tobacco abuse [Z72.0] 11/05/2013  . Situational depression [F43.21] 11/05/2013  . Rib pain [R07.81] 11/05/2013  . HTN (hypertension) [I10] 11/05/2013  . Hypoglycemia [E16.2] 11/05/2013    Total Time spent with patient: 35 minutes  Subjective:   [name] is a 39 y.o. male patient who presents to [facility] ED with seizures due to stress. Patient was seen for follow-up. He was lying in the bed. He reported that he was living in the [facility]  heart group home since August. He reported that he has been compliant with his medication but has not been taking the valproic acid as it was not given to him by the group home staff. He reported that he has been feeling well and his diabetes is also under control. Patient reported that he woke up around 2 AM and he found his face on the floor due to seizure. Ambulance was called and he was brought to the hospital. He reported that now he has found out that he broke his toe after the fall. He stated that he is having suicidal thoughts and is unable to contract for  safety. He reported that he is also having suicidal ideation with a plan to jump [place] front of the train [place]. He is unable to contract for safety at this time.   patient reported that he feels hopeless helpless depressed and that Klonopin does not help him. He reported that he has taken Ativan in the past. He reported that he sleeps well at night. He denied having auditory or visual hallucinations. He reported that he has not been using any drugs or alcohol recently.   Past Psychiatric History: Dx: Depression and bipolar disorder Psychiatrist: comes to the group home once every 3 months per the pt Therapist: Denies  Hospitalizations: Endorses at [facility] and [facility] ECT: Denies  Suicide attempt/Self-harm: Endorses. OD via insulin and  Homicide attempts/harming others: Denies Previous Medication Trials: Quetiapine   Risk to Self: Suicidal Ideation: Yes-Currently Present Suicidal Intent: No Is patient at risk for suicide?: No Suicidal Plan?: No Access to Means: No What has been your use of drugs/alcohol within the last 12 months?: None reported How many times?: 1 Other Self Harm Risks: None reported Triggers for Past Attempts: None known Intentional Self Injurious Behavior: None Risk to Others: Homicidal Ideation: No Thoughts of Harm to Others: No Current Homicidal Intent: No Current Homicidal Plan: No Access to Homicidal Means: No Identified Victim: None identified History of harm to others?: No Assessment of Violence: None Noted Violent Behavior Description: None reported Does patient have access to weapons?: No Criminal Charges Pending?: No Does patient have a court date: No Prior Inpatient  Therapy: Prior Inpatient Therapy: Yes Prior Therapy Dates: 08/2015 Prior Therapy Facilty/Provider(s): [facility] Reason for Treatment: depression Prior Outpatient Therapy: Prior Outpatient Therapy: Yes Prior Therapy Dates: current Prior Therapy  Facilty/Provider(s): [facility] Reason for Treatment: depression Does patient have an ACCT team?: Yes Does patient have Intensive In-House Services?  : No Does patient have [facility] services? : No Does patient have P4CC services?: No  Past Medical History:  Past Medical History  Diagnosis Date  . Depression   . Anxiety   . Hypertension   . GERD (gastroesophageal reflux disease)   . Seizures [facility])     Past Surgical History  Procedure Laterality Date  . Finger surgery Left     5th digit   Family History:  Family History  Problem Relation Age of Onset  . CAD Mother   . Diabetes Mellitus II Father     Family Psychiatric History:  Depression: Mother Anxiety: Mother, grandmother Bipolar disorder: "I don't know."  Schizophrenia: Denies  Panic disorder: Denies   Substance abuse: Possibly mother as she "used pills" frequently Alcohol: Denies   Suicide: Denies   Social History:  History  Alcohol Use No    Comment: pt states he no longer drinks     History  Drug Use No    Comment: had ecstacy in urine but deneis    Social History   Social History  . Marital Status: Single    Spouse Name: N/A  . Number of Children: N/A  . Years of Education: N/A   Social History Main Topics  . Smoking status: Current Every Day Smoker -- 0.50 packs/day    Types: Cigarettes  . Smokeless tobacco: None  . Alcohol Use: No     Comment: pt states he no longer drinks  . Drug Use: No     Comment: had ecstacy in urine but deneis  . Sexual Activity: Not Asked   Other Topics Concern  . None   Social History Narrative   Additional Social History:    History of alcohol / drug use?: No history of alcohol / drug abuse  Pt has been sober from alcohol for 2 years. He used to drink 6-12 beers per day.  Patient denies a h/o physical, sexual and emotional abuse.   Allergies:  No Known Allergies  Labs:  Results for orders placed or performed during the hospital encounter of 11/08/15 (from the  past 48 hour(s))  Glucose, capillary     Status: Abnormal   Collection Time: 11/08/15  2:46 AM  Result Value Ref Range   Glucose-Capillary 106 (H) 65 - 99 mg/dL  Comprehensive metabolic panel     Status: Abnormal   Collection Time: 11/08/15  2:49 AM  Result Value Ref Range   Sodium 138 135 - 145 mmol/L   Potassium 4.2 3.5 - 5.1 mmol/L    Comment: HEMOLYSIS AT THIS LEVEL MAY AFFECT RESULT   Chloride 104 101 - 111 mmol/L   CO2 27 22 - 32 mmol/L   Glucose, Bld 98 65 - 99 mg/dL   BUN 10 6 - 20 mg/dL   Creatinine, Ser 0.96 0.61 - 1.24 mg/dL   Calcium 9.8 8.9 - 10.3 mg/dL   Total Protein 8.8 (H) 6.5 - 8.1 g/dL   Albumin 4.9 3.5 - 5.0 g/dL   AST 41 15 - 41 U/L   ALT 52 17 - 63 U/L   Alkaline Phosphatase 56 38 - 126 U/L   Total Bilirubin 0.5 0.3 - 1.2 mg/dL   GFR  calc non Af Amer >60 >60 mL/min   GFR calc Af Amer >60 >60 mL/min    Comment: (NOTE) The eGFR has been calculated using the CKD EPI equation. This calculation has not been validated in all clinical situations. eGFR's persistently <60 mL/min signify possible Chronic Kidney Disease.    Anion gap 7 5 - 15  CBC WITH DIFFERENTIAL     Status: None   Collection Time: 11/08/15  2:49 AM  Result Value Ref Range   WBC 5.6 3.8 - 10.6 K/uL   RBC 5.38 4.40 - 5.90 MIL/uL   Hemoglobin 17.4 13.0 - 18.0 g/dL   HCT 50.7 40.0 - 52.0 %   MCV 94.2 80.0 - 100.0 fL   MCH 32.2 26.0 - 34.0 pg   MCHC 34.2 32.0 - 36.0 g/dL   RDW 13.6 11.5 - 14.5 %   Platelets 204 150 - 440 K/uL   Neutrophils Relative % 45 %   Neutro Abs 2.6 1.4 - 6.5 K/uL   Lymphocytes Relative 35 %   Lymphs Abs 1.9 1.0 - 3.6 K/uL   Monocytes Relative 16 %   Monocytes Absolute 0.9 0.2 - 1.0 K/uL   Eosinophils Relative 3 %   Eosinophils Absolute 0.1 0 - 0.7 K/uL   Basophils Relative 1 %   Basophils Absolute 0.0 0 - 0.1 K/uL  Ethanol/ETOH     Status: None   Collection Time: 11/08/15  2:49 AM  Result Value Ref Range   Alcohol, Ethyl (B) <5 <5 mg/dL    Comment:         LOWEST DETECTABLE LIMIT FOR SERUM ALCOHOL IS 5 mg/dL FOR MEDICAL PURPOSES ONLY   Valproic acid level     Status: Abnormal   Collection Time: 11/08/15  2:49 AM  Result Value Ref Range   Valproic Acid Lvl <10 (L) 50.0 - 100.0 ug/mL  Urinalysis complete, with microscopic ([facility] only)     Status: Abnormal   Collection Time: 11/08/15  3:55 AM  Result Value Ref Range   Color, Urine STRAW (A) YELLOW   APPearance CLEAR (A) CLEAR   Glucose, UA NEGATIVE NEGATIVE mg/dL   Bilirubin Urine NEGATIVE NEGATIVE   Ketones, ur NEGATIVE NEGATIVE mg/dL   Specific Gravity, Urine 1.006 1.005 - 1.030   Hgb urine dipstick NEGATIVE NEGATIVE   pH 6.0 5.0 - 8.0   Protein, ur NEGATIVE NEGATIVE mg/dL   Nitrite NEGATIVE NEGATIVE   Leukocytes, UA NEGATIVE NEGATIVE   RBC / HPF NONE SEEN 0 - 5 RBC/hpf   WBC, UA 0-5 0 - 5 WBC/hpf   Bacteria, UA NONE SEEN NONE SEEN   Squamous Epithelial / LPF NONE SEEN NONE SEEN   Mucous PRESENT   Urine Drug Screen, Qualitative ([facility] only)     Status: None   Collection Time: 11/08/15  3:55 AM  Result Value Ref Range   Tricyclic, Ur Screen NONE DETECTED NONE DETECTED   Amphetamines, Ur Screen NONE DETECTED NONE DETECTED   MDMA (Ecstasy)Ur Screen NONE DETECTED NONE DETECTED   Cocaine Metabolite,Ur [place] NONE DETECTED NONE DETECTED   Opiate, Ur Screen NONE DETECTED NONE DETECTED   Phencyclidine (PCP) Ur S NONE DETECTED NONE DETECTED   Cannabinoid 50 Ng, Ur  NONE DETECTED NONE DETECTED   Barbiturates, Ur Screen NONE DETECTED NONE DETECTED   Benzodiazepine, Ur Scrn NONE DETECTED NONE DETECTED   Methadone Scn, Ur NONE DETECTED NONE DETECTED    Comment: (NOTE) 938  Tricyclics, urine  Cutoff 1000 ng/mL 200  Amphetamines, urine             Cutoff 1000 ng/mL 300  MDMA (Ecstasy), urine           Cutoff 500 ng/mL 400  Cocaine Metabolite, urine       Cutoff 300 ng/mL 500  Opiate, urine                   Cutoff 300 ng/mL 600  Phencyclidine (PCP), urine      Cutoff 25  ng/mL 700  Cannabinoid, urine              Cutoff 50 ng/mL 800  Barbiturates, urine             Cutoff 200 ng/mL 900  Benzodiazepine, urine           Cutoff 200 ng/mL 1000 Methadone, urine                Cutoff 300 ng/mL 1100 1200 The urine drug screen provides only a preliminary, unconfirmed 1300 analytical test result and should not be used for non-medical 1400 purposes. Clinical consideration and professional judgment should 1500 be applied to any positive drug screen result due to possible 1600 interfering substances. A more specific alternate chemical method 1700 must be used in order to obtain a confirmed analytical result.  1800 Gas chromato graphy / mass spectrometry (GC/MS) is the preferred 1900 confirmatory method.     Current Facility-Administered Medications  Medication Dose Route Frequency Provider Last Rate Last Dose  . amLODipine (NORVASC) tablet 2.5 mg  2.5 mg Oral Daily Donita Brooks, MD   2.5 mg at 11/09/15 1008  . buPROPion (WELLBUTRIN XL) 24 hr tablet 150 mg  150 mg Oral Daily Donita Brooks, MD   150 mg at 11/09/15 1009  . clonazePAM (KLONOPIN) tablet 1 mg  1 mg Oral TID Donita Brooks, MD   1 mg at 11/09/15 1008  . escitalopram (LEXAPRO) tablet 20 mg  20 mg Oral QAC breakfast Nance Pear, MD   20 mg at 11/09/15 0827  . hydrOXYzine (ATARAX/VISTARIL) tablet 50 mg  50 mg Oral BID PRN Donita Brooks, MD   50 mg at 11/09/15 0851  . hydrOXYzine (ATARAX/VISTARIL) tablet 50 mg  50 mg Oral QHS Donita Brooks, MD   50 mg at 11/08/15 2211  . ibuprofen (ADVIL,MOTRIN) tablet 600 mg  600 mg Oral Q6H PRN Daymon Larsen, MD   600 mg at 11/09/15 2956  . meloxicam (MOBIC) tablet 15 mg  15 mg Oral Daily Donita Brooks, MD   15 mg at 11/08/15 1310  . pantoprazole (PROTONIX) EC tablet 40 mg  40 mg Oral Daily Donita Brooks, MD   40 mg at 11/09/15 1008   Current Outpatient Prescriptions  Medication Sig Dispense Refill  . amLODipine (NORVASC) 2.5 MG tablet Take 2.5 mg by mouth  daily.    Marland Kitchen buPROPion (WELLBUTRIN XL) 300 MG 24 hr tablet Take 300 mg by mouth daily.    . clonazePAM (KLONOPIN) 1 MG tablet Take 1-2 mg by mouth See admin instructions. 1 tablet every morning, 1 tablet every afternoon, and 2 tablets at bedtime    . divalproex (DEPAKOTE ER) 500 MG 24 hr tablet Take 2,000 mg by mouth at bedtime.    Marland Kitchen escitalopram (LEXAPRO) 20 MG tablet Take 20 mg by mouth every morning.    Marland Kitchen esomeprazole (NEXIUM) 40 MG capsule Take 40 mg by mouth daily.     Marland Kitchen  hydrOXYzine (ATARAX/VISTARIL) 50 MG tablet Take 50 mg by mouth 3 (three) times daily as needed for anxiety.    . Melatonin 5 MG TABS Take 10 mg by mouth at bedtime.    . meloxicam (MOBIC) 15 MG tablet Take 15 mg by mouth daily.    . Multiple Vitamins-Minerals (THEREMS-M) TABS Take 1 tablet by mouth daily.    . traZODone (DESYREL) 100 MG tablet Take 300 mg by mouth at bedtime.    Marland Kitchen levofloxacin (LEVAQUIN) 750 MG tablet Take 1 tablet (750 mg total) by mouth daily. (Patient not taking: Reported on 11/08/2015) 5 tablet 0    Musculoskeletal: Strength & Muscle Tone: within normal limits Gait & Station: normal Patient leans: N/A  Psychiatric Specialty Exam: Review of Systems  Genitourinary: Positive for frequency.  Endo/Heme/Allergies: Positive for polydipsia.       Frequent nocturia.   Psychiatric/Behavioral: Positive for depression, suicidal ideas and memory loss. Negative for hallucinations and substance abuse. The patient has insomnia. The patient is not nervous/anxious.   All other systems reviewed and are negative.   Blood pressure 131/68, pulse 56, temperature 98.7 F (37.1 C), temperature source Oral, resp. rate 18, height '5\' 11"'$  (1.803 m), weight 205 lb (92.987 kg), SpO2 100 %.Body mass index is 28.6 kg/(m^2).  General Appearance: Casual and Fairly Groomed  Engineer, water::  Fair  Speech:  Clear and Coherent and Normal Rate  Volume:  Normal  Mood:  Depressed and Hopeless  Affect:  Restricted  Thought Process:   Coherent, Goal Directed, Intact, Linear and Logical  Orientation:  Full (Time, Place, and Person)  Thought Content:  Negative  Suicidal Thoughts:  Yes.  with intent/plan  Homicidal Thoughts:  No  Memory:  Immediate;   Good Recent;   Good Remote;   Good  Judgement:  Fair  Insight:  Fair  Psychomotor Activity:  Normal  Concentration:  Good  Recall:  Good  Fund of Knowledge:Good  Language: Good  Akathisia:  Negative  Handed:  Right  AIMS (if indicated):     Assets:  Communication Skills Desire for Improvement Financial Resources/Insurance Housing  ADL's:  Intact  Cognition: WNL  Sleep:       SUICIDE RISK ASSESSMENT   ED Presentation  1.  Current admission precipitated by suicide attempt? No [1]  2.  Suicide Attempt History? Past attempts of high lethality [2]  3.  Suicidal Ideation? Constant suicidal thought [2]  4.  Suicide Plan? Has plan with actual OR potential access to planned method [2]  5.  Plan Lethality? Highly lethal plan (Carbon monoxide, gun, hanging, jumping) [2]  6.  Safety Plan Agreement? Unwilling OR unable to agree due to impaired reality testing [2]  7.  Current Morbid Thoughts (reunion, fantasies, preoccupations with death)? Frequently [1]  8.  Elopement Risk? Low risk [0]  9.  The following symptoms were present during assessment: Hopeless, Helpless, Anhedonia, Guilt/shame, Anxiety, Insomnia and Agitation, 5-6 symptoms present [2]  TOTAL SCORE: 12  Scoring Key: 10 or higher = Imminent Risk (consider 1:1) 4 - 9 = Moderate Risk (consider q 15 minute observation)Attended alcohol, tobacco, prescription and other drug psychoeducation group.  0 - 3 = Low Risk (consider q 30 minute observation)  ------------------------------------------------------------------------------------------------------------------ PLEASE ADDRESS THE FOLLOWING 5 ISSUES:  1.  Physician's Subjective Appraisal of Risk (check one): Patient replies appear trustworthy  2.   Family History of Suicide?  no  3.  Protective measures (select all that apply): Agrees to treatment plan and follow up  4.  High Risk Diagnoses (select all that apply): Depression/Bipolar Disorder  5.  Dangerousness Assessment Suicide  Risk factors reviewed and risk assessed to be: High  Protection factors reviewed and risk assessed to be: High  Response to treatment and risk assessed to be: High  Support reviewed and risk assessed to be: High  Acceptance of discharge and outpatient treatment plan reviewed and risk assessed to be: High  Overall risk assessed to be: High    Treatment Plan Summary: Medication management  Disposition: Recommend psychiatric Inpatient admission when medically cleared.   Plan:    patient will be admitted to the inpatient behavioral health unit for stabilization and safety.  He will be continued on his medications and I will add Depakote ER 750 mg at bedtime for his mood symptoms as well as for his seizure disorder.  He will be monitored for worsening of his suicidal thoughts and will be admitted in the bed becomes available  Thank you for allowing me to participate in the care of this patient.     This note was generated in part or whole with voice recognition software. Voice regonition is usually quite accurate but there are transcription errors that can and very often do occur. I apologize for any typographical errors that were not detected and corrected.  Rainey Pines 11/09/2015 10:18 AM

## 2015-11-09 NOTE — ED Notes (Addendum)
Pt. Noted in room. Pt. C/o insomnia. No distress or abnormal behavior noted. Will continue to monitor with security cameras. Q 15 minute rounds continue. 

## 2015-11-09 NOTE — ED Notes (Signed)
Pt  asking about being admitted to the unit downstairs. RN told him there are no beds available and he would remain here tonight. Pt was accepting. Calm and cooperative. Will continue 15 minute checks and observation by security camera for safety.

## 2015-11-09 NOTE — ED Notes (Signed)
Patient asleep in room. No noted distress or abnormal behavior. Will continue 15 minute checks and observation by security cameras for safety. 

## 2015-11-09 NOTE — ED Notes (Signed)
Patient resting quietly in room. Pt had questions regarding his medications. Pt was pleased with RN's answers. No noted distress or abnormal behaviors noted. Will continue 15 minute checks and observation by security camera for safety.

## 2015-11-09 NOTE — ED Notes (Signed)
[facility] (TTS) called. There [facility] [place].  Pt will remain in the BHU until a bed becomes available.

## 2015-11-10 DIAGNOSIS — R569 Unspecified convulsions: Secondary | ICD-10-CM | POA: Diagnosis not present

## 2015-11-10 IMAGING — CR DG RIBS 2V*R*
1 series · 6 of 6 positions shown · non-contrast
Comparison: 09/22/2013; 02/22/2013

CLINICAL DATA: Post fall, now with right anterior rib pain and
tenderness

EXAM:
RIGHT RIBS - 2 VIEW

[Series 1: w chest pa · 0.14mm/px · 6 of 6 slices shown]
[im 1/6]
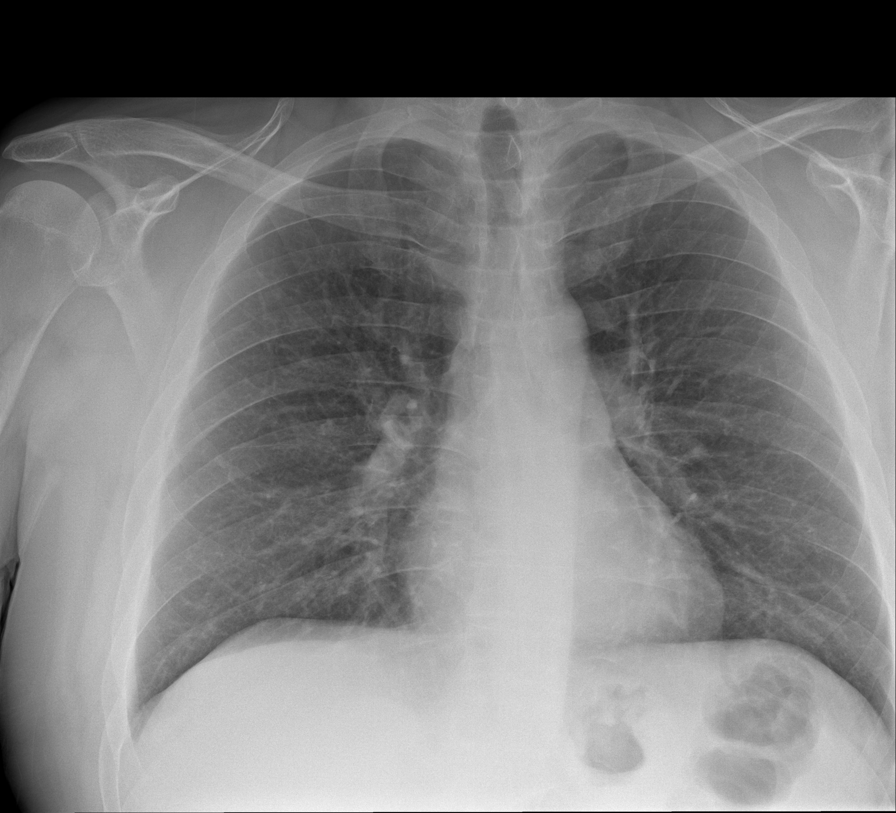
[im 2/6]
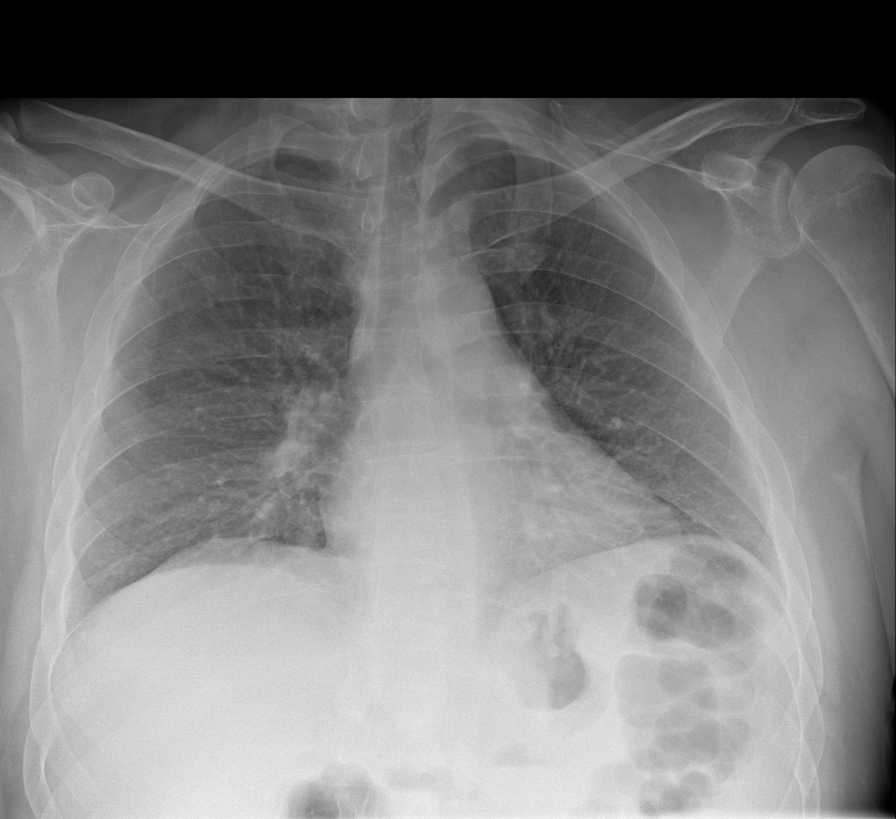
[im 3/6]
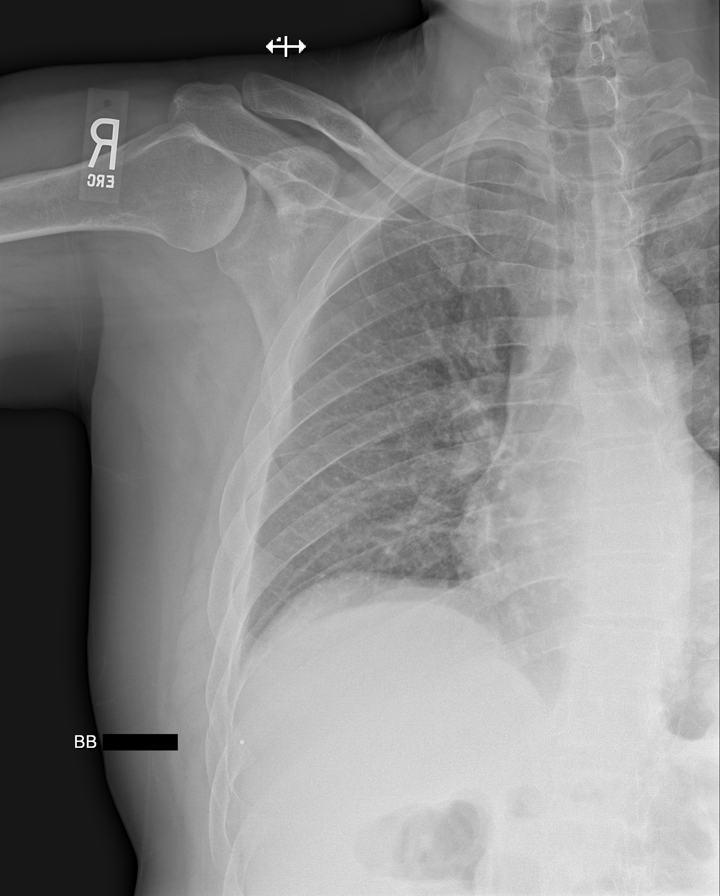
[im 4/6]
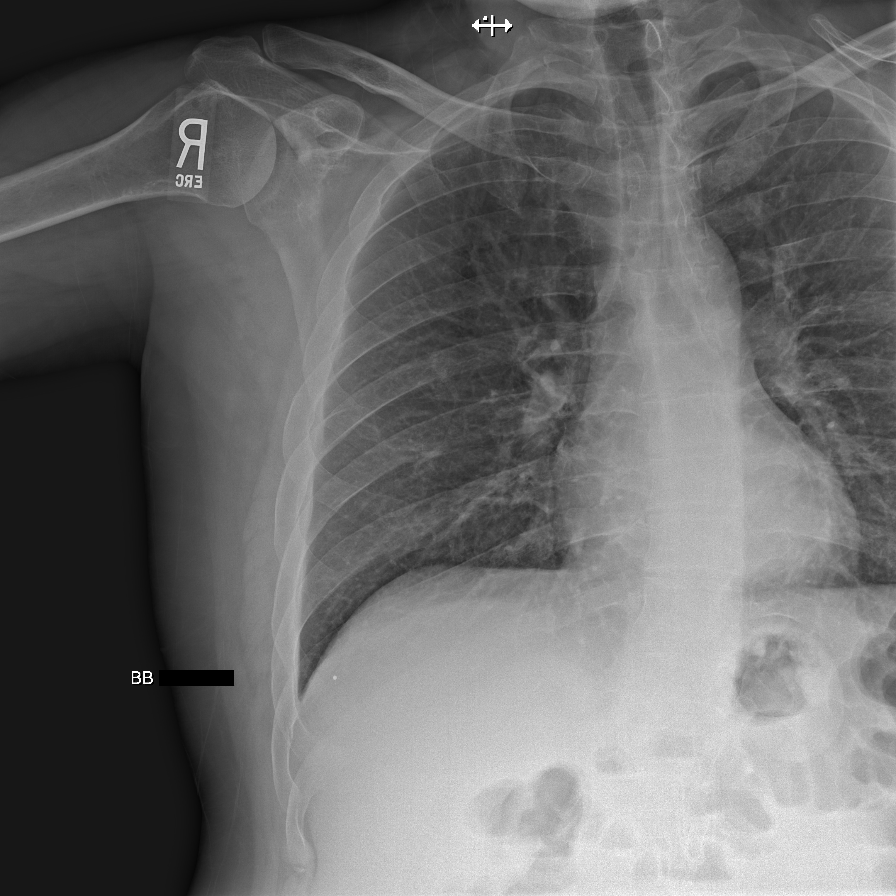
[im 5/6]
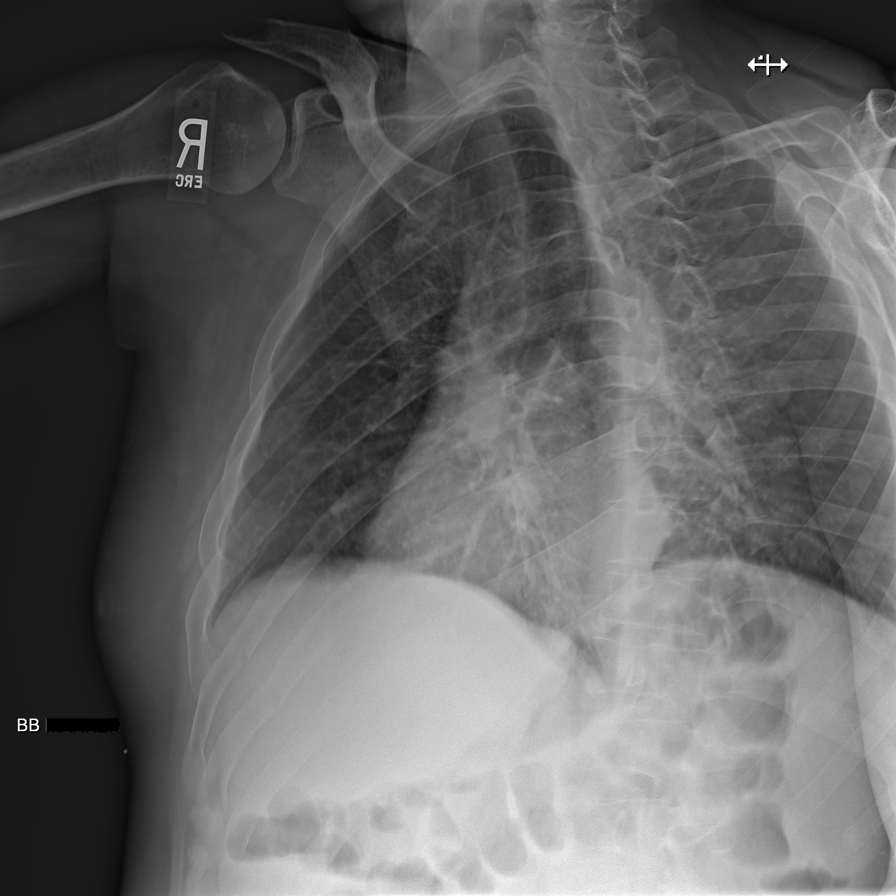
[im 6/6]
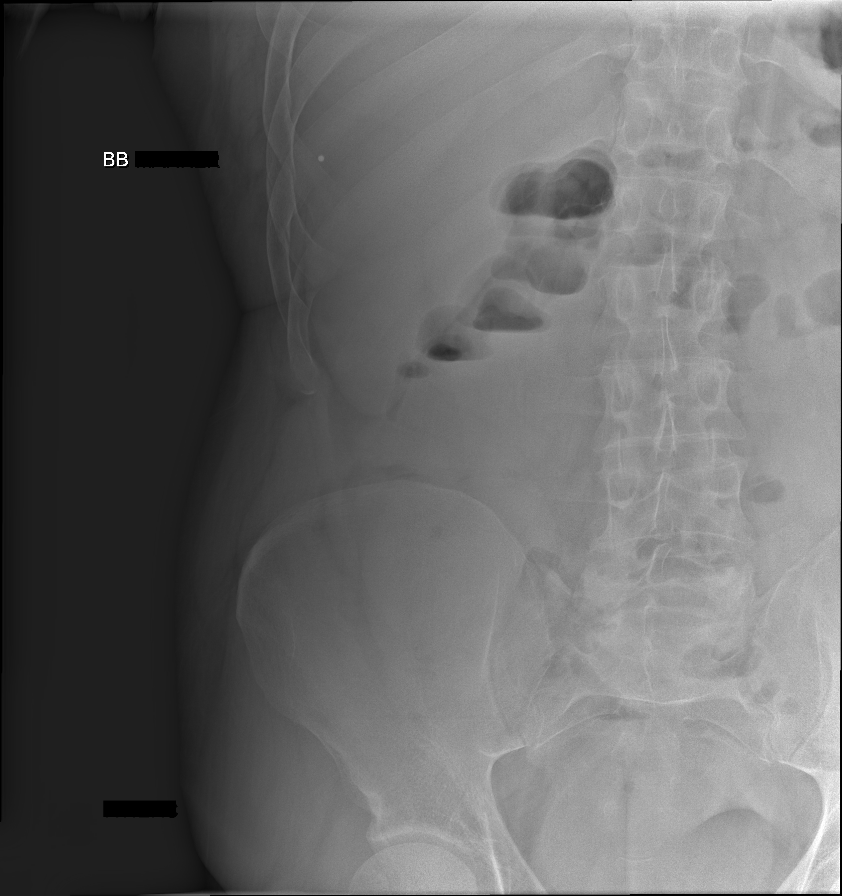

[6 of 6 positions shown; findings below may reference images not displayed]

FINDINGS: Grossly unchanged cardiac silhouette and mediastinal contours.
Grossly unchanged mild slightly nodular thickening of the pulmonary
interstitium No focal airspace opacities. No evidence of edema. No
pleural effusion or pneumothorax.

There are minimally displaced fractures involving the anterior
lateral aspect of the right 7th, 8th, 9th and 10th ribs.
IMPRESSION: Minimally displaced fractures involving the anterior lateral aspect
of the right 7th - 10th ribs without associated pneumothorax or
pleural effusion.

## 2015-11-10 NOTE — ED Notes (Signed)
Pt. Noted sleeping in room. No complaints or concerns voiced. No distress or abnormal behavior noted. Will continue to monitor with security cameras. Q 15 minute rounds continue. 

## 2015-11-10 NOTE — ED Notes (Signed)
Patient eating breakfast at this time. Patient does not show signs of distress. Maintained on 15 minute checks and observation by security camera for safety.

## 2015-11-10 NOTE — ED Notes (Signed)
Patient currently in room watching television. Patient has no complaints at this time and shows no signs of distress. Maintained on 15 minute checks and observation by security camera for safety.

## 2015-11-10 NOTE — ED Notes (Signed)
Patient resting in room watching television. Patient states that his pain in his toe is not controlled at he wishes to have stronger pain medication than the 1000 mg of tylenol that have already been prescribed. Patient shows no signs of distress. Maintained on 15 minute checks and observation by security camera for safety.

## 2015-11-10 NOTE — ED Notes (Signed)
Patient is calm and cooperative. Patient does not voice complaints at this time and wanted to discuss the plan of care. Patient does not appear anxious nor does he appear to be in pain at this time.

## 2015-11-10 NOTE — ED Notes (Signed)
Patient asleep in room. No noted distress or abnormal behavior. Will continue 15 minute checks and observation by security cameras for safety. 

## 2015-11-10 NOTE — ED Notes (Signed)
Pt. Noted in room lying on the bed; with complaint of pain at right foot; no other concerns voiced. No distress or abnormal behavior noted. Will continue to monitor with security cameras. Q 15 minute rounds continue.

## 2015-11-10 NOTE — ED Notes (Signed)
Pt. Noted sleeping room. No complaints or concerns voiced. No distress or abnormal behavior noted. Will continue to monitor with security cameras. Q 15 minute rounds continue.

## 2015-11-10 NOTE — ED Provider Notes (Signed)
Filed Vitals:   11/09/15 1024 11/09/15 1915  BP: 116/72 125/77  Pulse: 64 58  Temp: 98.2 F (36.8 C) 98.1 F (36.7 C)  Resp: 18 16    Patient is no acute distress, remains medically stable for psychiatric disposition and placement  Emily FilbertJonathan E Williams, MD 11/10/15 [phone]

## 2015-11-10 NOTE — ED Notes (Signed)
Patient eating dinner at this time. Patient had questions about medication regimen which nurse reviewed with patient. Patient remains calm and cooperative with no complaints at this time. Maintained on 15 minute checks and observation by security camera for safety.

## 2015-11-10 NOTE — ED Notes (Signed)
Pt. Noted in room lying in bed watching the tv., . No complaints or concerns voiced. No distress or abnormal behavior noted. Will continue to monitor with security cameras. Q 15 minute rounds continue.

## 2015-11-10 NOTE — ED Notes (Signed)
ENVIRONMENTAL ASSESSMENT Potentially harmful objects out of patient reach: Yes Personal belongings secured: Yes Patient dressed in hospital provided attire only: Yes Plastic bags out of patient reach: Yes Patient care equipment (cords, cables, call bells, lines, and drains) shortened, removed, or accounted for: Yes Equipment and supplies removed from bottom of stretcher: Yes Potentially toxic materials out of patient reach: Yes Sharps container removed or out of patient reach: Yes  Patient is sleeping in room. Patient shows no signs of distress. Maintained on 15 minute checks and observation by security camera for safety.

## 2015-11-10 NOTE — ED Notes (Signed)
Patient received breakfast tray 

## 2015-11-10 NOTE — ED Notes (Signed)
Patient resting quietly in room. No noted distress or abnormal behaviors noted. Will continue 15 minute checks and observation by security camera for safety. 

## 2015-11-10 NOTE — ED Notes (Signed)
Pt. Noted in room lying on the bed watching the tv; . No complaints or concerns voiced. No distress or abnormal behavior noted. Will continue to monitor with security cameras. Q 15 minute rounds continue. 

## 2015-11-10 NOTE — ED Notes (Signed)
Snack and beverage given. 

## 2015-11-10 NOTE — ED Notes (Signed)
Report received from Reather ConverseKarena T., RN. Pt. Alert and oriented in no distress; verbalizes having SI with a plan; denies having HI, AVH and; c/o right foot pain.  Pt. Instructed to come to me with problems or concerns.Will continue to monitor for safety via security cameras and Q 15 minute checks.

## 2015-11-11 ENCOUNTER — Inpatient Hospital Stay
Admit: 2015-11-11 | Discharge: 2015-11-13 | DRG: 885 | Disposition: A | Discharge status: Home / Self Care | Payer: Medicare Other | Source: Intra-hospital | Attending: Psychiatry | Admitting: Psychiatry

## 2015-11-11 DIAGNOSIS — X58XXXA Exposure to other specified factors, initial encounter: Secondary | ICD-10-CM | POA: Diagnosis present

## 2015-11-11 DIAGNOSIS — G4700 Insomnia, unspecified: Secondary | ICD-10-CM | POA: Diagnosis present

## 2015-11-11 DIAGNOSIS — F17210 Nicotine dependence, cigarettes, uncomplicated: Secondary | ICD-10-CM | POA: Diagnosis present

## 2015-11-11 DIAGNOSIS — G40909 Epilepsy, unspecified, not intractable, without status epilepticus: Secondary | ICD-10-CM | POA: Diagnosis present

## 2015-11-11 DIAGNOSIS — Z720 Tobacco use: Secondary | ICD-10-CM | POA: Diagnosis present

## 2015-11-11 DIAGNOSIS — Z833 Family history of diabetes mellitus: Secondary | ICD-10-CM | POA: Diagnosis not present

## 2015-11-11 DIAGNOSIS — F1010 Alcohol abuse, uncomplicated: Secondary | ICD-10-CM | POA: Diagnosis present

## 2015-11-11 DIAGNOSIS — F332 Major depressive disorder, recurrent severe without psychotic features: Secondary | ICD-10-CM | POA: Diagnosis not present

## 2015-11-11 DIAGNOSIS — Z79899 Other long term (current) drug therapy: Secondary | ICD-10-CM | POA: Diagnosis not present

## 2015-11-11 DIAGNOSIS — Z915 Personal history of self-harm: Secondary | ICD-10-CM

## 2015-11-11 DIAGNOSIS — I10 Essential (primary) hypertension: Secondary | ICD-10-CM | POA: Diagnosis present

## 2015-11-11 DIAGNOSIS — Z8249 Family history of ischemic heart disease and other diseases of the circulatory system: Secondary | ICD-10-CM

## 2015-11-11 DIAGNOSIS — F1020 Alcohol dependence, uncomplicated: Secondary | ICD-10-CM

## 2015-11-11 DIAGNOSIS — Z608 Other problems related to social environment: Secondary | ICD-10-CM | POA: Diagnosis present

## 2015-11-11 DIAGNOSIS — F603 Borderline personality disorder: Secondary | ICD-10-CM | POA: Diagnosis not present

## 2015-11-11 DIAGNOSIS — S92501A Displaced unspecified fracture of right lesser toe(s), initial encounter for closed fracture: Secondary | ICD-10-CM

## 2015-11-11 DIAGNOSIS — Y92199 Unspecified place in other specified residential institution as the place of occurrence of the external cause: Secondary | ICD-10-CM | POA: Diagnosis not present

## 2015-11-11 DIAGNOSIS — K219 Gastro-esophageal reflux disease without esophagitis: Secondary | ICD-10-CM | POA: Diagnosis present

## 2015-11-11 DIAGNOSIS — R45851 Suicidal ideations: Secondary | ICD-10-CM | POA: Diagnosis present

## 2015-11-11 DIAGNOSIS — R569 Unspecified convulsions: Secondary | ICD-10-CM | POA: Diagnosis not present

## 2015-11-11 DIAGNOSIS — S92514A Nondisplaced fracture of proximal phalanx of right lesser toe(s), initial encounter for closed fracture: Secondary | ICD-10-CM | POA: Diagnosis present

## 2015-11-11 LAB — TSH: TSH: 3.767 u[IU]/mL (ref 0.350–4.500)

## 2015-11-11 MED ORDER — IBUPROFEN 800 MG PO TABS
800.0000 mg | ORAL_TABLET | Freq: Three times a day (TID) | ORAL | Status: AC
  Administered 2015-11-12 (×3): 800 mg via ORAL
  Filled 2015-11-11 (×3): qty 1

## 2015-11-11 MED ORDER — MELOXICAM 7.5 MG PO TABS
15.0000 mg | ORAL_TABLET | Freq: Every day | ORAL | Status: DC
  Filled 2015-11-11: qty 2

## 2015-11-11 MED ORDER — NICOTINE 21 MG/24HR TD PT24
21.0000 mg | MEDICATED_PATCH | Freq: Every day | TRANSDERMAL | Status: DC
  Administered 2015-11-11 – 2015-11-13 (×3): 21 mg via TRANSDERMAL
  Filled 2015-11-11 (×3): qty 1

## 2015-11-11 MED ORDER — DIVALPROEX SODIUM ER 500 MG PO TB24
2000.0000 mg | ORAL_TABLET | Freq: Every day | ORAL | Status: DC
  Administered 2015-11-11 – 2015-11-12 (×2): 2000 mg via ORAL
  Filled 2015-11-11 (×2): qty 4

## 2015-11-11 MED ORDER — AMLODIPINE BESYLATE 5 MG PO TABS
2.5000 mg | ORAL_TABLET | Freq: Every day | ORAL | Status: DC
  Administered 2015-11-11 – 2015-11-13 (×3): 2.5 mg via ORAL
  Filled 2015-11-11 (×4): qty 1

## 2015-11-11 MED ORDER — TRAMADOL HCL 50 MG PO TABS
50.0000 mg | ORAL_TABLET | Freq: Three times a day (TID) | ORAL | Status: DC | PRN
  Administered 2015-11-11 – 2015-11-12 (×2): 50 mg via ORAL
  Filled 2015-11-11 (×2): qty 1

## 2015-11-11 MED ORDER — CHLORDIAZEPOXIDE HCL 25 MG PO CAPS
50.0000 mg | ORAL_CAPSULE | Freq: Three times a day (TID) | ORAL | Status: DC
  Administered 2015-11-11 – 2015-11-13 (×6): 50 mg via ORAL
  Filled 2015-11-11 (×6): qty 2

## 2015-11-11 MED ORDER — ESCITALOPRAM OXALATE 10 MG PO TABS: 20.0000 mg | ORAL_TABLET | ORAL | Status: DC

## 2015-11-11 MED ORDER — BUPROPION HCL ER (XL) 150 MG PO TB24: 300.0000 mg | ORAL_TABLET | Freq: Every day | ORAL | Status: DC

## 2015-11-11 MED ORDER — ESCITALOPRAM OXALATE 10 MG PO TABS
20.0000 mg | ORAL_TABLET | Freq: Every day | ORAL | Status: DC
  Administered 2015-11-11 – 2015-11-13 (×3): 20 mg via ORAL
  Filled 2015-11-11 (×3): qty 2

## 2015-11-11 MED ORDER — HYDROXYZINE HCL 25 MG PO TABS
25.0000 mg | ORAL_TABLET | Freq: Three times a day (TID) | ORAL | Status: DC | PRN
  Administered 2015-11-11: 25 mg via ORAL
  Filled 2015-11-11: qty 1

## 2015-11-11 MED ORDER — TRAZODONE HCL 100 MG PO TABS
300.0000 mg | ORAL_TABLET | Freq: Every day | ORAL | Status: DC
  Administered 2015-11-11 – 2015-11-12 (×2): 300 mg via ORAL
  Filled 2015-11-11 (×2): qty 3

## 2015-11-11 MED ORDER — IBUPROFEN 800 MG PO TABS
800.0000 mg | ORAL_TABLET | Freq: Four times a day (QID) | ORAL | Status: DC | PRN
  Administered 2015-11-11: 800 mg via ORAL
  Filled 2015-11-11: qty 1

## 2015-11-11 MED ORDER — PANTOPRAZOLE SODIUM 40 MG PO TBEC
40.0000 mg | DELAYED_RELEASE_TABLET | Freq: Every day | ORAL | Status: DC
  Administered 2015-11-11 – 2015-11-13 (×3): 40 mg via ORAL
  Filled 2015-11-11 (×3): qty 1

## 2015-11-11 MED ORDER — ALUM & MAG HYDROXIDE-SIMETH 200-200-20 MG/5ML PO SUSP: 30.0000 mL | ORAL | Status: DC | PRN

## 2015-11-11 MED ORDER — MAGNESIUM HYDROXIDE 400 MG/5ML PO SUSP: 30.0000 mL | Freq: Every day | ORAL | Status: DC | PRN

## 2015-11-11 MED ORDER — ACETAMINOPHEN 325 MG PO TABS: 650.0000 mg | ORAL_TABLET | Freq: Four times a day (QID) | ORAL | Status: DC | PRN

## 2015-11-11 NOTE — ED Notes (Signed)
Report called to Behavioral medicine floor.

## 2015-11-11 NOTE — ED Notes (Signed)
Pt. Noted in room resting quietly;. No complaints or concerns voiced. No distress or abnormal behavior noted. Will continue to monitor with security cameras. Q 15 minute rounds continue. 

## 2015-11-11 NOTE — Progress Notes (Signed)
Admission Note:  D: Pt appeared depressed  With  a flat affect.  Patient voice of passive suicidal ideation , stated he could  Contract for safety. Stated he dose not want to return to the group home . Stated they treat him mean there. Patient has not been on his dilantin for over a year . Stated he had a grand mal seizure  Rolled off the bed breaking the right foot middle toe which look bluish  Tint . Patient has blood pressure problems   Pt is redirectable and cooperative with assessment.      A: Pt admitted to unit per protocol, skin assessment and search done and no contraband found.  Pt  educated on therapeutic milieu rules. Pt was introduced to milieu by nursing staff.    R: Pt was receptive to education about the milieu .  15 min safety checks started. Clinical research associatewriter offered support

## 2015-11-11 NOTE — Progress Notes (Signed)
Recreation Therapy Notes  Date: 12.21.16 Time: 3:00 pm Location: Craft Room  Group Topic: Self-esteem  Goal Area(s) Addresses:  Patient will write at least one positive attribute about self. Patient will verbalize benefit of having a healthy self-esteem.  Behavioral Response: Attentive, Interactive  Intervention: I Am  Activity: Patients were given a worksheet with the letter I on it and instructed to write as many positive traits about themselves inside the letter.  Education: LRT educated patients on different ways to increase their self-esteem  Education Outcome: Acknowledges education/In group clarification offered   Clinical Observations/Feedback: Patient completed activity by telling LRT positive traits to write down. Patient contributed to group discussion by stating it was difficult to think of positive traits, and how his self-esteem affects him.  [name],[name], LRT/CTRS 11/11/2015 4:00 PM

## 2015-11-11 NOTE — ED Notes (Addendum)
Patient did wake up to eat breakfast, patient alert and oriented, patient states that toe hurts some, but not to bad" patient does have SI, no plan, does not want to go back to group home, , no avh at this time. Patient without s/s of distress, patient wit q 15 checks, and camera monitoring.

## 2015-11-11 NOTE — Progress Notes (Signed)
D: Patient appears bright. He states he has pain in his right middle toe. He states the pain at a 8 and asks for tramadol. He stated the ibuprofen was not working for him. He denies HI/AVH. States he has SI and a plan for if he had to go back to the group. He contracts for safety. He states he doesn't like his group home and they're verbally abusive and neglectful.  A: Medication was given with education. Encouragement was provided.  R: He has been compliant with medication. He is calm and cooperative .Safety maintained with 15 min checks.

## 2015-11-11 NOTE — [facility] Group Notes (Signed)
[facility] LCSW Aftercare Discharge Planning Group Note  11/11/2015 9:15 AM  Participation Quality: Did Not Attend. Patient invited to participate but declined.   Junaid Wurzer F. Gwenneth Whiteman, MSW, LCSWA, LCAS   

## 2015-11-11 NOTE — H&P (Signed)
Psychiatric Admission Assessment Adult  Patient Identification: [name] MRN:  [id number] Date of Evaluation:  11/11/2015 Chief Complaint:  Depression Principal Diagnosis: MDD (major depressive disorder), recurrent severe, without psychosis (HCC) Diagnosis:   Patient Active Problem List   Diagnosis Date Noted  . GERD (gastroesophageal reflux disease) [K21.9] 11/11/2015  . Fracture of third toe, right, closed [S92.501A] 11/11/2015  . Borderline personality disorder [F60.3] 11/11/2015  . Alcohol use disorder, severe, in controlled environment (HCC) [F10.20] 11/11/2015  . MDD (major depressive disorder), recurrent severe, without psychosis (HCC) [F33.2] 11/08/2015  . Seizure disorder (HCC) [G40.909] 11/05/2013  . Tobacco abuse [Z72.0] 11/05/2013  . HTN (hypertension) [I10] 11/05/2013   History of Present Illness:  [name] is a 39 y.o. male patient who presented to [facility] ED with seizures on 12/18. EMS brought him in after the group home called them for the patient having a seizure.  It had resolved when EMS arrived but they note that he started having a seizure during transport to the ED.   He reported that he was living in the [facility] group home since August. He reported that he has been compliant with his medication but has not been taking the valproic acid as it was not given to him by the group home staff.  His Depakote level was less than 10 at arrival.  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 Per the ER note they did not believe this was some epileptic seizure as patient did not have any postictal. "I strongly doubted true epileptic seizures upon arrival given how quickly his mental status improved. He has an odd affect but he also does have a psychiatric history. Shortly after her return to my desk and was told that he was having additional seizure-like activity. I spoke to him laterally and told him to stop and he briefly looked at me but his activity continued. I  gave him a risk sternal rub and the seizure-like activity immediately stopped and he was speaking to me in clear and coherent sentences. I am not concerned about epileptic seizure activity."  Patient reported that he woke up around 2 AM and he found his face on the floor due to seizure. Ambulance was called and he was brought to the hospital. He reported that now he has found out that he broke his toe after the fall.   When he was interviewed by the psychiatrist in the emergency department he reported suicidal thoughts with thoughts of jumping in front of a train [place]  Patient reported that he feels hopeless helpless depressed and that Klonopin does not help him. He reported that he has taken Ativan in the past. He reported that he sleeps well at night. He denied having auditory or visual hallucinations. He reported that he has not been using any drugs or alcohol recently.   Patient remained focused today is not returning to his current group home as he dislikes the staff there. He says he would rather go in his sleep under a bridge that having to return back to the group home.  Per review of records patient had multiple hospitalizations years ago at [facility] for overdosing on insulin a multitude of times. Patient to years ago was named incompetent adult and was assigned a guardian from social services from [facility]. He's been living in group homes since then. He has been at his current group home for the last 6 months.  Currently he is  not psychotic, hypomanic manic, delusional, aggressive, or agitated. Patient is well organized linear and logical. He basically does not want to return to the group home. Since that this admission has been triggered by the social issues not so much a psychiatric.  Associated Signs/Symptoms: Depression Symptoms:  depressed mood, recurrent thoughts of death, (Hypo) Manic Symptoms:  denies Anxiety Symptoms:   denies Psychotic Symptoms:  denies PTSD Symptoms: NA Total Time spent with patient: 1 hour  Past Psychiatric History: Depression and bipolar disorder.  Sees a psychiatrist that comes to the group home once every 3 months  [facility]: Denies   Suicide attempt/Self-harm: Endorses. OD via insulin and  Homicide attempts/harming others: Denies Previous Medication Trials: Quetiapine  Patient was hospitalized in our unit back in 2015. He was discharged with a diagnosis of schizoaffective disorder and borderline personality disorder along with alcohol abuse.  He had history of severe suicidal attempt by overdosing on insulin multiple times. After that severe suicidal attempt he was made an incompetent adult and now he has a guardian.  Also per records looks like he had [facility] a [facility]. [facility] also [facility] for an evaluation of a DWI.  Patient explains that he was discharged with a DWI 2 years ago but he is only now taking care of discharge. He says the charges were dropped.  His diagnoses have ranged from schizoaffective disorder, bipolar disorder and major depressive disorder along with borderline personality disorder.  Past Medical History:  Past Medical History  Diagnosis Date  . Depression   . Anxiety   . Hypertension   . GERD (gastroesophageal reflux disease)   . Seizures [facility](HCC)     Past Surgical History  Procedure Laterality Date  . Finger surgery Left     5th digit   Family History:  Family History  Problem Relation Age of Onset  . CAD Mother   . Diabetes Mellitus II Father    Family Psychiatric  History:   Social History:  History  Alcohol Use No    Comment: pt states he no longer drinks     History  Drug Use No    Comment: had ecstacy in urine but deneis    Social History   Social History  . Marital Status: Single    Spouse  Name: N/A  . Number of Children: N/A  . Years of Education: N/A   Social History Main Topics  . Smoking status: Current Every Day Smoker -- 0.50 packs/day    Types: Cigarettes  . Smokeless tobacco: None  . Alcohol Use: No     Comment: pt states he no longer drinks  . Drug Use: No     Comment: had ecstacy in urine but deneis  . Sexual Activity: Not Asked   Other Topics Concern  . None   Social History Narrative    Allergies:  No Known Allergies   Lab Results: No results found for this or any previous visit (from the past 48 hour(s)).  Metabolic Disorder Labs:  Lab Results  Component Value Date   HGBA1C 5.3 06/01/2014   Lab Results  Component Value Date   PROLACTIN 31.4* 09/03/2015   Lab Results  Component Value Date   CHOL 127 06/01/2014   TRIG 123 06/01/2014   HDL 39* 06/01/2014   VLDL 25 06/01/2014   LDLCALC 63 06/01/2014   LDLCALC 68 02/20/2013  Current Medications: Current Facility-Administered Medications  Medication Dose Route Frequency Provider Last Rate Last Dose  . acetaminophen (TYLENOL) tablet 650 mg  650 mg Oral Q6H PRN Jimmy Footman, MD      . alum & mag hydroxide-simeth (MAALOX/MYLANTA) 200-200-20 MG/5ML suspension 30 mL  30 mL Oral Q4H PRN Jimmy Footman, MD      . amLODipine (NORVASC) tablet 2.5 mg  2.5 mg Oral Daily Jimmy Footman, MD      . chlordiazePOXIDE (LIBRIUM) capsule 50 mg  50 mg Oral TID Jimmy Footman, MD      . divalproex (DEPAKOTE ER) 24 hr tablet 2,000 mg  2,000 mg Oral QHS Jimmy Footman, MD      . escitalopram (LEXAPRO) tablet 20 mg  20 mg Oral Daily Jimmy Footman, MD      . hydrOXYzine (ATARAX/VISTARIL) tablet 25 mg  25 mg Oral TID PRN Jimmy Footman, MD      . ibuprofen (ADVIL,MOTRIN) tablet 800 mg  800 mg Oral TID Jimmy Footman, MD      . magnesium hydroxide (MILK OF MAGNESIA) suspension 30 mL  30 mL Oral Daily PRN Jimmy Footman, MD      . nicotine (NICODERM CQ - dosed in mg/24 hours) patch 21 mg  21 mg Transdermal Daily Jimmy Footman, MD      . pantoprazole (PROTONIX) EC tablet 40 mg  40 mg Oral QAC breakfast Jimmy Footman, MD      . traMADol Janean Sark) tablet 50 mg  50 mg Oral TID PRN Jimmy Footman, MD      . traZODone (DESYREL) tablet 300 mg  300 mg Oral QHS Jimmy Footman, MD       PTA Medications: Prescriptions prior to admission  Medication Sig Dispense Refill Last Dose  . amLODipine (NORVASC) 2.5 MG tablet Take 2.5 mg by mouth daily.   unknown  . buPROPion (WELLBUTRIN XL) 300 MG 24 hr tablet Take 300 mg by mouth daily.   unknown  . clonazePAM (KLONOPIN) 1 MG tablet Take 1-2 mg by mouth See admin instructions. 1 tablet every morning, 1 tablet every afternoon, and 2 tablets at bedtime   unknown  . divalproex (DEPAKOTE ER) 500 MG 24 hr tablet Take 2,000 mg by mouth at bedtime.   unknown  . escitalopram (LEXAPRO) 20 MG tablet Take 20 mg by mouth every morning.   unknown  . esomeprazole (NEXIUM) 40 MG capsule Take 40 mg by mouth daily.    unknown  . hydrOXYzine (ATARAX/VISTARIL) 50 MG tablet Take 50 mg by mouth 3 (three) times daily as needed for anxiety.   PRN  . Melatonin 5 MG TABS Take 10 mg by mouth at bedtime.   unknown  . meloxicam (MOBIC) 15 MG tablet Take 15 mg by mouth daily.   unknown  . Multiple Vitamins-Minerals (THEREMS-M) TABS Take 1 tablet by mouth daily.   unknown  . traZODone (DESYREL) 100 MG tablet Take 300 mg by mouth at bedtime.   unknown  . levofloxacin (LEVAQUIN) 750 MG tablet Take 1 tablet (750 mg total) by mouth daily. (Patient not taking: Reported on 11/08/2015) 5 tablet 0 Completed Course at Unknown time    Musculoskeletal: Strength & Muscle Tone: within normal limits Gait & Station: normal Patient leans: N/A  Psychiatric Specialty Exam: Physical Exam  Constitutional: He is oriented to person, place, and time. He appears  well-developed and well-nourished.  HENT:  Head: Normocephalic and atraumatic.  Eyes: Conjunctivae and EOM are normal.  Neck: Normal range of motion.  Respiratory: Effort normal.  Musculoskeletal: Normal range of motion.  Neurological: He is alert and oriented to person, place, and time.  Skin: Skin is warm and dry.    Review of Systems  Constitutional: Negative.   HENT: Negative.   Eyes: Negative.   Respiratory: Negative.   Cardiovascular: Negative.   Gastrointestinal: Negative.   Genitourinary: Negative.   Musculoskeletal: Negative.   Skin: Negative.   Neurological: Negative.   Endo/Heme/Allergies: Negative.   Psychiatric/Behavioral: Negative.     Blood pressure 149/72, pulse 51, temperature 97.9 F (36.6 C), temperature source Oral, resp. rate 18, height 5\' 11"  (1.803 m), weight 87.544 kg (193 lb).Body mass index is 26.93 kg/(m^2).  General Appearance: Fairly Groomed  Patent attorney::  Good  Speech:  Clear and Coherent  Volume:  Normal  Mood:  Euthymic  Affect:  Appropriate  Thought Process:  Linear  Orientation:  Full (Time, Place, and Person)  Thought Content:  Hallucinations: None  Suicidal Thoughts:  Yes.  without intent/plan  Homicidal Thoughts:  No  Memory:  Immediate;   Good Recent;   Good Remote;   Good  Judgement:  Poor  Insight:  Lacking  Psychomotor Activity:  Normal  Concentration:  Good  Recall:  Good  Fund of Knowledge:Good  Language: Good  Akathisia:  No  Handed:    AIMS (if indicated):     Assets:  Manufacturing systems engineer Physical Health Social Support  ADL's:  Intact  Cognition: WNL  Sleep:        Treatment Plan Summary: Daily contact with patient to assess and evaluate symptoms and progress in treatment and Medication management  39 year old Caucasian male with a history of mood instability, multiple suicidal attempts, depressive episodes and alcoholism. The patient likely suffers from severe borderline personality disorder. He is an  incompetent adult he has a guardian from [employer].  Appears to current hospitalization has been triggered by patient not wanting to return to his group home and wanting to return to live independently.  Major depressive disorder: Prior to admission patient was treated with Wellbutrin this medication will be discontinued as he apparently suffers from seizures. He'll be continued on Lexapro 20 mg by mouth daily along with trazodone 300 mg by mouth daily at bedtime.  Borderline personality disorder: for mood hyperreactivity patient is being treated with Depakote 2000 mg by mouth daily at bedtime  Insomnia: Continue trazodone 300 mg by mouth daily at bedtime  Alcohol use disorder: Appears that this is not an active problem as patient is in a controlled environment.  Anxiety: Patient on high dose of clonazepam. I will discontinue the clonazepam and instead I will start him on Librium 50 mg by mouth 3 times a day. This medication hopefully will be tapered up by his outpatient psychiatrist.  Also order Vistaril when necessary  Hypertension continue Norvasc 2.5 mg by mouth daily  Seizures vrs pseudoseizures: Continue Depakote ER 2000 mg daily at bedtime.  GERD continue Protonix 40 mg by mouth daily  Tobacco use disorder patient will receive nicotine patch 21 mg  Recent fracture of right toe: We will use ibuprofen every 8 hours for 4 doses. He also received tramadol when necessary.   Precautions every 15 minute checks  Diet low sodium  Labs: We'll check Depakote level in the next 3-5 days.  Discharge disposition: Patient refuses to return to his group home. Will discuss case with guardian.  Discharge follow-up: Patient says he follows up with an ACT is unclear as to which ACT his seen  I certify that inpatient services furnished can reasonably be expected to improve the patient's condition.   Jimmy Footman 12/21/20165:11 PM

## 2015-11-11 NOTE — B[facility] Group Notes (Signed)
[facility] LCSW Group Therapy   11/11/2015 1:15 PM   Type of Therapy: Group Therapy   Participation Level: Active   Participation Quality: Attentive, Sharing and Supportive   Affect: Appropriate  Cognitive: Alert and Oriented   Insight: Developing/Improving and Engaged   Engagement in Therapy: Developing/Improving and Engaged   Modes of Intervention: Clarification, Confrontation, Discussion, Education, Exploration, Limit-setting, Orientation, Problem-solving, Rapport Building, Dance movement psychotherapisteality Testing, Socialization and Support   Summary of Progress/Problems: The topic for group today was emotional regulation. This group focused on both positive and negative emotion identification and allowed group members to process ways to identify feelings, regulate negative emotions, and find healthy ways to manage internal/external emotions. Group members were asked to reflect on a time when their reaction to an emotion led to a negative outcome and explored how alternative responses using emotion regulation would have benefited them. Group members were also asked to discuss a time when emotion regulation was utilized when a negative emotion was experienced. Pt shared with the group the loss of his material possessions due to his mental illness and subsequent entry into a group home setting.  Pt shared he has experienced immense pain and grief from this, as well as due to his break-up with his girlfriend.  Pt shared he was unable to cope with these feelings in the past, but that he does cope with these feelings well, by talking about them now and plans to continue this method once discharged.  Pt was polite and cooperative with the CSW and other group members and focused and attentive to the topics discussed and the sharing of others.  Pt was polite and cooperative with the CSW and other group members and focused and attentive to the topics discussed and the sharing of others.     Dorothe PeaJonathan F. Hadli Vandemark, MSW, LCSWA,  LCAS

## 2015-11-11 NOTE — [facility] Suicide Risk Assessment (Signed)
[facility]   Nursing information obtained from:    Demographic factors:    Current Mental Status:    Loss Factors:    Historical Factors:    Risk Reduction Factors:    Total Time spent with patient: 1 hour Principal Problem: MDD (major depressive disorder), recurrent severe, without psychosis (HCC) Diagnosis:   Patient Active Problem List   Diagnosis Date Noted  . GERD (gastroesophageal reflux disease) [K21.9] 11/11/2015  . Fracture of third toe, right, closed [S92.501A] 11/11/2015  . Borderline personality disorder [F60.3] 11/11/2015  . Alcohol use disorder, severe, in controlled environment (HCC) [F10.20] 11/11/2015  . MDD (major depressive disorder), recurrent severe, without psychosis (HCC) [F33.2] 11/08/2015  . Seizure disorder (HCC) [G40.909] 11/05/2013  . Tobacco abuse [Z72.0] 11/05/2013  . HTN (hypertension) [I10] 11/05/2013     Continued Clinical Symptoms:  Alcohol Use Disorder Identification Test Final Score (AUDIT): 0 The "Alcohol Use Disorders Identification Test", Guidelines for Use in Primary Care, Second Edition.  World Science writerHealth Organization [facility](WHO). Score between 0-7:  no or low risk or alcohol related problems. Score between 8-15:  moderate risk of alcohol related problems. Score between 16-19:  high risk of alcohol related problems. Score 20 or above:  warrants further diagnostic evaluation for alcohol dependence and treatment.   CLINICAL FACTORS:   Personality Disorders:   Cluster B Comorbid depression Previous Psychiatric Diagnoses and Treatments     Psychiatric Specialty Exam: Physical Exam  ROS   COGNITIVE FEATURES THAT CONTRIBUTE TO RISK:  Closed-mindedness    SUICIDE RISK:   Moderate:  Frequent suicidal ideation with limited intensity, and duration, some specificity in terms of plans, no associated intent, good self-control, limited dysphoria/symptomatology, some risk factors present, and identifiable protective factors,  including available and accessible social support.  [facility]  Medical Decision Making:  Established Problem, Worsening (2)  I certify that inpatient services furnished can reasonably be expected to improve the patient's condition.   Jimmy FootmanHernandez-Gonzalez,  Mccayla Shimada 11/11/2015, 5:24 PM

## 2015-11-11 NOTE — ED Provider Notes (Signed)
-----------------------------------------   6:22 AM on 11/11/2015 -----------------------------------------   Blood pressure 119/75, pulse 68, temperature 98.4 F (36.9 C), temperature source Oral, resp. rate 18, height 5\' 11"  (1.803 m), weight 205 lb (92.987 kg), SpO2 100 %.  The patient had no acute events since last update.  Calm and cooperative at this time.  Disposition is pending per Psychiatry/Behavioral Medicine team recommendations.     Irean HongJade J Sung, MD 11/11/15 [phone]

## 2015-11-11 NOTE — Tx Team (Signed)
Initial Interdisciplinary Treatment Plan   PATIENT STRESSORS: Health problems Medication change or noncompliance   PATIENT STRENGTHS: Active sense of humor Average or above average intelligence General fund of knowledge Supportive family/friends   PROBLEM LIST: Problem List/Patient Goals Date to be addressed Date deferred Reason deferred Estimated date of resolution  Seizure 11/11/15      Depression 11/11/15     Suicidal 11/11/15     Fracture of third toe Right foot 11/11/15                                    DISCHARGE CRITERIA:  Ability to meet basic life and health needs Improved stabilization in mood, thinking, and/or behavior Safe-care adequate arrangements made  PRELIMINARY DISCHARGE PLAN: Outpatient therapy Placement in alternative living arrangements  PATIENT/FAMIILY INVOLVEMENT: This treatment plan has been presented to and reviewed with the patient, Rosemary HolmsWyman Dale Krull, and/or family member, .  The patient and family have been given the opportunity to ask questions and make suggestions.  Rasheka Denard A Brenson Hartman 11/11/2015, 2:06 PM

## 2015-11-11 NOTE — ED Notes (Signed)
Patient states that He did have seizure and fell and was kicking which cause him to fracture toe, states that He had stopped His dilantin about year ago, thinking He was ok, but that He knows now that He should not have done that, patient talks about how He hates living in group home and wants to die if He has to stay there, when ask if He was being mistreated He states "No" patient is calm, pleasant, had tylenol for pain, will continue to monitor.

## 2015-11-11 NOTE — ED Notes (Signed)
Pt. Noted in room sitting on the side of the bed watching the tv. No complaints or concerns voiced. No distress or abnormal behavior noted. Will continue to monitor with security cameras. Q 15 minute rounds continue. 

## 2015-11-11 NOTE — ED Notes (Signed)
Patient eating lunch, patient without s/s of distress, states that He is still having negative thoughts, does not have plan to harm self, but does want to die He states if things do not change. Will continue to monitor. [facility] .

## 2015-11-11 NOTE — [facility] Group Notes (Deleted)
[facility] LCSW Aftercare Discharge Planning Group Note  11/11/2015 9:15 AM  Participation Quality: Did Not Attend. Patient invited to participate but declined.   Behr Cislo F. Nyra Anspaugh, MSW, LCSWA, LCAS   

## 2015-11-11 NOTE — ED Notes (Signed)
[facility] Patient will be transporting by w/c with law enforcement and nurse. Patient without s/s of distress.

## 2015-11-12 DIAGNOSIS — F332 Major depressive disorder, recurrent severe without psychotic features: Secondary | ICD-10-CM | POA: Diagnosis not present

## 2015-11-12 MED ORDER — OXYCODONE-ACETAMINOPHEN 5-325 MG PO TABS
1.0000 | ORAL_TABLET | Freq: Four times a day (QID) | ORAL | Status: DC | PRN
  Administered 2015-11-12 – 2015-11-13 (×3): 1 via ORAL
  Filled 2015-11-12 (×3): qty 1

## 2015-11-12 NOTE — Plan of Care (Signed)
Problem: Ineffective individual coping Goal: STG: Patient will remain free from self harm Outcome: Progressing No self harm reported or observed     

## 2015-11-12 NOTE — Tx Team (Signed)
Interdisciplinary Treatment Plan Update (Adult)  Date:  11/12/2015 Time Reviewed:  9:16 AM  Progress in Treatment: Attending groups: Yes. Participating in groups:  Yes. Taking medication as prescribed:  Yes. Tolerating medication:  Yes. Family/Significant othe contact made:  No, will contact:  guardian Kalman Drape [phone]  Patient understands diagnosis:  Yes. Discussing patient identified problems/goals with staff:  Yes. Medical problems stabilized or resolved:  Yes. Denies suicidal/homicidal ideation: Yes. Issues/concerns per patient self-inventory:  No. Other:  New problem(s) identified: No, Describe:  none reported  Discharge Plan or Barriers:Patient will stabilize on medications and discharge back to his group home with follow up ACT services. Patient will continue with ACT team until PSR is in place.   Reason for Continuation of Hospitalization: Anxiety Depression Suicidal ideation  Comments:Patient admitted for SI and does not like his group home and can return with a plan to find other placement. Patient referred for Care Coordination and discussion of referring patient from ACT services to [facility] (Day Program).   Estimated length of stay:up to 1 days expected discharge Friday 11/13/15  New goal(s):  Review of initial/current patient goals per problem list:   1.  Goal(s):participate in care planning  Met:  No  Target date:by discharge  2.  Goal (s):eliminate SI  Met:  No  Target date:by discharge  3.  Goal(s):decrease depression and anxiety  Met:  No  Target date:by discharge   Attendees: Physician:  Merlyn Albert, MD 12/22/20169:16 AM  Nursing:   Floyde Parkins, RN 12/22/20169:16 AM  Other:  Carmell Austria, [place] 12/22/20169:16 AM  Other:   12/22/20169:16 AM  Other:   12/22/20169:16 AM  Other:  12/22/20169:16 AM  Other:  12/22/20169:16 AM  Other:  12/22/20169:16 AM  Other:  12/22/20169:16 AM  Other:  12/22/20169:16 AM  Other:  12/22/20169:16 AM   Other:   12/22/20169:16 AM   Scribe for Treatment Team:   Keene Breath, MSW, LCSWA [phone]  11/12/2015, 9:16 AM

## 2015-11-12 NOTE — Progress Notes (Signed)
Recreation Therapy Notes  Date: 12.22.16 Time: 3:00 pm Location: Craft Room  Group Topic: Coping Skills/Leisure Education  Goal Area(s) Addresses:  Patient will identify things they are grateful for. Patient will verbalize understanding how being grateful can influence decision making.  Behavioral Response: Attentive, Interactive  Intervention: Grateful Wheel  Activity: Patients were given an "I Am Grateful For" worksheet and instructed to list 2-3 things they are grateful for under each category.  Education: LRT educated patients on how being aware of what they are grateful for can affect their decision making skills.  Education Outcome: Acknowledges education/In group clarification offered   Clinical Observations/Feedback: Patient completed activity by telling LRT what things he is grateful for to write down. Patient contributed to group discussion by stating things he is grateful for.  Jacquelynn CreeGreene,Sheetal Lyall M, LRT/CTRS 11/12/2015 4:20 PM

## 2015-11-12 NOTE — [facility] Group Notes (Signed)
[facility] LCSW Group Therapy   11/12/2015 11:00 am   Type of Therapy: Group Therapy   Participation Level: Active   Participation Quality: Attentive, Sharing and Supportive   Affect: Appropriate   Cognitive: Alert and Oriented   Insight: Developing/Improving and Engaged   Engagement in Therapy: Developing/Improving and Engaged   Modes of Intervention: Clarification, Confrontation, Discussion, Education, Exploration, Limit-setting, Orientation, Problem-solving, Rapport Building, Dance movement psychotherapisteality Testing, Socialization and Support   Summary of Progress/Problems: The topic for group was balance in life. Today's group focused on defining balance in one's own words, identifying things that can knock one off balance, and exploring healthy ways to maintain balance in life. Group members were asked to provide an example of a time when they felt off balance, describe how they handled that situation, and process healthier ways to regain balance in the future. Group members were asked to share the most important tool for maintaining balance that [facility] and how they plan to apply this method after discharge. Pt shared that alcohol had been his coping mechanism, but when he found out he had severe liver problems the pt felt "off-balance" for weeks.  Pt shares he now talks with others to ventilate frustrations and talk about difficulties. Pt was polite and cooperative with the CSW and other group members and focused and attentive to the topics discussed and the sharing of others.

## 2015-11-12 NOTE — Progress Notes (Signed)
Recreation Therapy Notes  INPATIENT RECREATION THERAPY ASSESSMENT  Patient Details Name: Donovan Persley MRN: [id number] DOB: Nov 12, 1976 Today's Date: 11/12/2015  Patient Stressors: Relationship, Death, Friends, Other (Comment) (Break-up a couple years ago from a long term relationship; mother died 3-4 years ago and father died lst year; lack of supportive friends; living in group home)  Coping Skills:   Isolate, Arguments, Avoidance, Self-Injury, Talking, Music, Other (Comment) (Cut himself once when girlfriend left; get to self and get quiet)  Personal Challenges: Anger, Communication, Concentration, Decision-Making, Expressing Yourself, Relationships, Self-Esteem/Confidence, Social Interaction, Stress Management, Trusting Others  Leisure Interests (2+):  Individual - Other (Comment) (Go riding, be outside)  Awareness of Community Resources:  Yes  Community Resources:  [employer], [place]  Current Use: No  If no, Barriers?: Financial  Patient Strengths:  No  Patient Identified Areas of Improvement:  Self-esteem, getting on his own  Current Recreation Participation:  Nothing  Patient Goal for Hospitalization:  To get medicine regulated  [place] of Residence:  [place] of Residence:  [place]   Current SI (including self-harm):  Yes  Current HI:  No  Consent to Intern Participation: N/A   Leonette Monarch, LRT/CTRS 11/12/2015, 11:52 AM

## 2015-11-12 NOTE — [facility] Group Notes (Signed)
[facility] Group Notes:  (Nursing/MHT/Case Management/Adjunct)  Date:  11/12/2015  Time:  2:13 PM  Type of Therapy:  Psychoeducational Skills  Participation Level:  Active  Participation Quality:  Appropriate  Affect:  Appropriate  Cognitive:  Appropriate  Insight:  Appropriate  Engagement in Group:  Engaged  Modes of Intervention:  Discussion and Education    Summary of Progress/Problems:  [name] 11/12/2015, 2:13 PM

## 2015-11-12 NOTE — Progress Notes (Signed)
D: Pt appeared depressed With a flat affect. Pt  Repeatedly complaining of pain. Medication administered as ordered.  Stated he dose not want to return to the group home .  right foot middle toe broke which look bluish tint.  Pt is redirectable and cooperative with care.   A: Encouragement and support offered. Medications administered as ordered. Denies SI, HI, AVH.   R: Pt was receptive remains safe on unit with q 15 min.
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
## 2015-11-12 NOTE — Progress Notes (Signed)
D: Pt appeared depressed With a flat affect. Pt socializing in day room. Stated he dose not want to return to the group home. Admits to thoughts of SI without a plan. Requested phone number for [facility]. Denies HI, AVH A: Encouragement and support offered. Medications administered as ordered. Q15 minute checks maintained for safety R: Pt was receptive, med compliant, calm and cooperative.

## 2015-11-12 NOTE — [facility] Group Notes (Signed)
[facility] Group Notes:  (Nursing/MHT/Case Management/Adjunct)  Date:  11/12/2015  Time:  2:11 PM  Type of Therapy:  Psychoeducational Skills  Participation Level:  Did Not Attend    Mickey Farberamela M Moses Ellison 11/12/2015, 2:11 PM
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
## 2015-11-13 DIAGNOSIS — F332 Major depressive disorder, recurrent severe without psychotic features: Secondary | ICD-10-CM | POA: Diagnosis not present

## 2015-11-13 LAB — VALPROIC ACID LEVEL: Valproic Acid Lvl: 64 ug/mL (ref 50.0–100.0)

## 2015-11-13 LAB — HEMOGLOBIN A1C: HEMOGLOBIN A1C: 5 % (ref 4.0–6.0)

## 2015-11-13 MED ORDER — CHLORDIAZEPOXIDE HCL 25 MG PO CAPS: 25.0000 mg | ORAL_CAPSULE | Freq: Three times a day (TID) | ORAL | Status: DC

## 2015-11-13 MED ORDER — DIVALPROEX SODIUM ER 500 MG PO TB24
2000.0000 mg | ORAL_TABLET | Freq: Every day | ORAL | Status: DC
Start: 2015-11-13 — End: 2022-03-24

## 2015-11-13 MED ORDER — OXYCODONE-ACETAMINOPHEN 5-325 MG PO TABS: 1.0000 | ORAL_TABLET | Freq: Four times a day (QID) | ORAL | Status: DC | PRN

## 2015-11-13 MED ORDER — IBUPROFEN 800 MG PO TABS: 800.0000 mg | ORAL_TABLET | Freq: Three times a day (TID) | ORAL | Status: DC | PRN

## 2015-11-13 NOTE — Clinical Social Work Psychosocial (Signed)
CSW called to notify of patient discharge on Friday 11/13/15 to Saintclair HalstedRhesha Carr (guardian) [phone] and left voicemail at 11:28am Thursday 11/12/15 and called her supervisor Johnnette Litteresere Lewis [phone] and left voicemail. CSW called [name] again in the afternoon after receiving no return call and a new voicemail was on her answering servcies saying that the office would be closed at 2pm on Thursday 11/12/15 until after the weekend. CSW then attempted multiple times to call the group home [facility] [phone] with no success but appeared to be a fax machine. CSW faxed a note to the group home at this number stating to call this CSW back but no call was received. Before leaving for the day, CSW called [place] [employer] at [phone] to request police do a Wellness Check on the group home. CSW arrived to office this morning and the group home manager Delois was angry that police were sent to her house and would not listen as to why this writer had to send police for a "wellness check". CSW spoke with [place] on call social worker Clydie BraunKaren [phone] who was able to speak with group home and find that patient can return but will not provide transport. Patient will discharge via cab voucher today to his group home with follow up at [facility] [phone]  Beryl MeagerJason Josemaria Brining, MSW, Theresia MajorsLCSWA [phone] 11/13/2015

## 2015-11-13 NOTE — Progress Notes (Signed)
  [facility] Dis[facility] :  Will you be returning to the same living situation after discharge:  Yes,  back to group home At discharge, do you have transportation home?: No. Group home will not provide transport so patient is given a cab voucher  Do you have the ability to pay for your medications: Yes,  patient has Medicaid  Release of information consent forms completed and in the chart;  Patient's signature needed at discharge.  Patient to Follow up at: Follow-up Information    Follow up with [facility] On 11/13/2015.   Why:  For follow-up care appt ACT will see patent today after discharge to group home   Contact information:   [address] [place] [phone] fax [phone]      Next level of care provider [facility]:no  Safety Planning and Suicide Prevention discussed: Yes,  SPE provided to guardian's on call social worker Clydie BraunKaren [phone] for patient's guardian who is on vacation Saintclair HalstedRhesha Carr [phone]  Have you used any form of tobacco in the last 30 days? (Cigarettes, Smokeless Tobacco, Cigars, and/or Pipes): Yes  Has patient been referred to the Quitline?: Patient refused referral  Patient has been referred for addiction treatment: N/A  Lulu Ridingngle, Kenniyah Sasaki T, MSW, LCSWA [phone] 11/13/2015, 11:18 AM

## 2015-11-13 NOTE — Discharge Summary (Addendum)
Physician Discharge Summary Note  Patient:  [name] is an 39 y.o., male MRN:  [id number] DOB:  [birth year] Patient phone:  [phone] (home)  Patient address:   [place] [address],  Total Time spent with patient: 30 minutes  Date of Admission:  11/11/2015 Date of Discharge: 11/13/15  Reason for Admission:  SI  Principal Problem: MDD (major depressive disorder), recurrent severe, without psychosis [facility]) Discharge Diagnoses: Patient Active Problem List   Diagnosis Date Noted  . GERD (gastroesophageal reflux disease) [K21.9] 11/11/2015  . Fracture of third toe, right, closed [S92.501A] 11/11/2015  . Borderline personality disorder [F60.3] 11/11/2015  . Alcohol use disorder, severe, in controlled environment ([place]) [F10.20] 11/11/2015  . MDD (major depressive disorder), recurrent severe, without psychosis ([place]) [F33.2] 11/08/2015  . 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 Tobacco abuse [Z72.0] 11/05/2013  . HTN (hypertension) [I10] 11/05/2013   History of Present Illness:  [name] is a 39 y.o. male patient who presented to [facility] ED with seizures on 12/18. EMS brought him in after the group home called them for the patient having a seizure. It had resolved when EMS arrived but they note that he started having a seizure during transport to the ED.  He reported that he was living in the [facility] group home since August. He reported that he has been compliant with his medication but has not been taking the valproic acid as it was not given to him by the group home staff. His Depakote level was less than 10 at arrival.  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 Per the ER note they did not believe this was some epileptic seizure as patient did not have any postictal. "I strongly doubted true epileptic seizures upon arrival given how quickly his mental status improved. He has an odd affect but he also does have a psychiatric  history. Shortly after her return to my desk and was told that he was having additional seizure-like activity. I spoke to him laterally and told him to stop and he briefly looked at me but his activity continued. I gave him a risk sternal rub and the seizure-like activity immediately stopped and he was speaking to me in clear and coherent sentences. I am not concerned about epileptic seizure activity."  Patient reported that he woke up around 2 AM and he found his face on the floor due to seizure. Ambulance was called and he was brought to the hospital. He reported that now he has found out that he broke his toe after the fall.   When he was interviewed by the psychiatrist in the emergency department he reported suicidal thoughts with thoughts of jumping in front of a train [place]. Patient reported that he feels hopeless helpless depressed and that Klonopin does not help him. He reported that he has taken Ativan in the past. He reported that he sleeps well at night. He denied having auditory or visual hallucinations. He reported that he has not been using any drugs or alcohol recently.   Patient remained focused today is not returning to his current group home as he dislikes the staff there. He says he would rather go in his sleep under a bridge that having to return back to the group home.  Per review of records patient had multiple hospitalizations years ago at [facility] for overdosing on insulin a multitude of times. Patient to years ago was named incompetent adult  and was assigned a guardian from social services from [facility]. He's been living in group homes since then. He has been at his current group home for the last 6 months.  Currently he is not psychotic, hypomanic manic, delusional, aggressive, or agitated. Patient is well organized linear and logical. He basically does not want to return to the group home. Since that this admission has been  triggered by the social issues not so much a psychiatric.  Associated Signs/Symptoms: Depression Symptoms: depressed mood, recurrent thoughts of death, (Hypo) Manic Symptoms: denies Anxiety Symptoms: denies Psychotic Symptoms: denies PTSD Symptoms: NA Total Time spent with patient: 1 hour  Past Psychiatric History: Depression and bipolar disorder. Sees a psychiatrist that comes to the group home once every 3 months  Hospitalizations: Endorses at [facility] and [facility] ECT: Denies  Suicide attempt/Self-harm: Endorses. OD via insulin and  Homicide attempts/harming others: Denies Previous Medication Trials: Quetiapine  Patient was hospitalized in our unit back in 2015. He was discharged with a diagnosis of schizoaffective disorder and borderline personality disorder along with alcohol abuse. He had history of severe suicidal attempt by overdosing on insulin multiple times. After that severe suicidal attempt he was made an incompetent adult and now he has a guardian.  Also per records looks like he had a prolonged hospitalization as a child at [facility]. He also had a recent hospitalization at [facility] for an evaluation of a DWI. Patient explains that he was discharged with a DWI 2 years ago but he is only now taking care of discharge. He says the charges were dropped.  His diagnoses have ranged from schizoaffective disorder, bipolar disorder and major depressive disorder along with borderline personality disorder.  Past Medical History:  Past Medical History  Diagnosis Date  . Depression   . Anxiety   . Hypertension   . GERD (gastroesophageal reflux disease)   . Seizures [facility])     Past Surgical History  Procedure Laterality Date  . Finger surgery Left     5th digit   Family History:  Family History  Problem Relation Age of Onset  . CAD Mother   . Diabetes Mellitus II Father    Family Psychiatric  History:   Social History:  History  Alcohol Use No    Comment: pt states he no longer drinks    History  Drug Use No    Comment: had ecstacy in urine but deneis    Social History   Social History  . Marital Status: Single    Spouse Name: N/A  . Number of Children: N/A  . Years of Education: N/A   Social History Main Topics  . Smoking status: Current Every Day Smoker -- 0.50 packs/day    Types: Cigarettes  . Smokeless tobacco: None  . Alcohol Use: No     Comment: pt states he no longer drinks  . Drug Use: No     Comment: had ecstacy in urine but deneis  . Sexual Activity: Not Asked   Other Topics Concern  . None   Social History Narrative    Allergies: No Known Allergies         Hospital Course:    39 year old Caucasian male with a history of mood instability, multiple suicidal attempts, depressive episodes and alcoholism. The patient likely suffers from severe borderline personality disorder. He is an incompetent adult he has a guardian from [place].  Appears to current hospitalization has been triggered by patient not wanting to return  to his group home and wanting to return to live independently.  Major depressive disorder: Prior to admission patient was treated with Wellbutrin this medication will be discontinued as he apparently suffers from seizures (however per records looks like it is pseudoseizures). He'll be continued on Lexapro 20 mg by mouth daily along with trazodone 300 mg by mouth daily at bedtime.   Borderline personality disorder: for mood hyperreactivity patient is being treated with Depakote 2000 mg by mouth daily at bedtime.   Insomnia: Continue trazodone 300 mg by mouth daily at bedtime  Alcohol use disorder: Appears that this is an ongoing problem.  Per ACT he leaves from [facility] and has been drinking.  No withdrawals from this stay.  Anxiety: Patient on high dose of clonazepam.  I will discontinue the clonazepam and instead I will start him on a Librium taper.  Initially started on librium 50 mg tid but will be d/c on 25 mg qid.  This medication hopefully will be tapered up by his outpatient psychiatrist.   Hypertension continue Norvasc 2.5 mg by mouth daily. BP wnl but HR in the 50's--pt asymptomatic  Seizures vrs pseudoseizures: Continue Depakote ER 2000 mg daily at bedtime.  GERD continue Protonix 40 mg by mouth daily  Tobacco use disorder patient will receive nicotine patch 21 mg  Recent fracture of right toe: We will use ibuprofen every 8 hours prn. He also will be given percocet q 6 h prn (ONLY 10 tabs GIVEN). Wearing a hard sole shoe.  Keep in on for 2 weeks.  Labs: depakote level on discharge (random level)  Discharge disposition: return to his [facility]  Discharge follow-up: for now continue to F/U with [facility] however we believe it will more beneficial for him to attend a day program instead of receiving ACT services.  We are recommending: 1-Day treatment program 2-Care coordination (referral made) as pt wants to be placed in a different [place]  On discharge the patient was calm, pleasant and cooperative. He was friendly. No unsafe behaviors displayed during his estate. Patient participated actively in groups. He attended programming. He complied with medications.  He denied SI, HI or auditory or visual hallucinations. No evidence of withdrawal symptoms. No evidence of delusions, psychosis, mania or hypomania.  Musculoskeletal: Strength & Muscle Tone: within normal limits Gait & Station: normal Patient leans: N/A  Psychiatric Specialty Exam: Review of Systems  Constitutional: Negative.   HENT: Negative.   Eyes: Negative.   Respiratory: Negative.   Cardiovascular: Negative.   Gastrointestinal: Negative.   Genitourinary: Negative.   Musculoskeletal: Positive for joint pain.  Skin: Negative.   Neurological: Negative.   Endo/Heme/Allergies: Negative.    Psychiatric/Behavioral: Negative.     Blood pressure 115/65, pulse 53, temperature 97.7 F (36.5 C), temperature source Oral, resp. rate 18, height '5\' 11"'$  (1.803 m), weight 87.544 kg (193 lb).Body mass index is 26.93 kg/(m^2).  General Appearance: Well Groomed  Engineer, water::  Good  Speech:  Clear and Coherent  Volume:  Normal  Mood:  Euthymic  Affect:  Appropriate  Thought Process:  Logical  Orientation:  Full (Time, Place, and Person)  Thought Content:  Hallucinations: None  Suicidal Thoughts:  No  Homicidal Thoughts:  No  Memory:  Immediate;   Good Recent;   Good Remote;   Good  Judgement:  Fair  Insight:  Fair  Psychomotor Activity:  Normal  Concentration:  Good  Recall:  Good  Fund of Knowledge:Good  Language: Good  Akathisia:  No  Handed:  AIMS (if indicated):     Assets:  Agricultural consultant Housing Physical Health Social Support  ADL's:  Intact  Cognition: WNL  Sleep:  Number of Hours: 7.02     Metabolic Disorder Labs:  Lab Results  Component Value Date   HGBA1C 5.0 11/08/2015   Lab Results  Component Value Date   PROLACTIN 31.4* 09/03/2015   Lab Results  Component Value Date   CHOL 127 06/01/2014   TRIG 123 06/01/2014   HDL 39* 06/01/2014   VLDL 25 06/01/2014   LDLCALC 63 06/01/2014   LDLCALC 68 02/20/2013   Results for TEVION, LAFORGE (MRN [id number]) as of 11/13/2015 09:16  Ref. Range 09/05/2015 05:15 11/08/2015 02:46 11/08/2015 02:49 11/08/2015 03:55 11/08/2015 22:34  Sodium Latest Ref Range: 135-145 mmol/L   138    Potassium Latest Ref Range: 3.5-5.1 mmol/L   4.2    Chloride Latest Ref Range: 101-111 mmol/L   104    CO2 Latest Ref Range: 22-32 mmol/L   27    BUN Latest Ref Range: 6-20 mg/dL   10    Creatinine Latest Ref Range: 0.61-1.24 mg/dL   0.96    Calcium Latest Ref Range: 8.9-10.3 mg/dL   9.8    EGFR (Non-African Amer.) Latest Ref Range: >60 mL/min   >60    EGFR (African American) Latest Ref  Range: >60 mL/min   >60    Glucose Latest Ref Range: 65-99 mg/dL   98    Anion gap Latest Ref Range: 5-15    7    Alkaline Phosphatase Latest Ref Range: 38-126 U/L   56    Albumin Latest Ref Range: 3.5-5.0 g/dL   4.9    AST Latest Ref Range: 15-41 U/L   41    ALT Latest Ref Range: 17-63 U/L   52    Total Protein Latest Ref Range: 6.5-8.1 g/dL   8.8 (H)    Total Bilirubin Latest Ref Range: 0.3-1.2 mg/dL   0.5    WBC Latest Ref Range: 3.8-10.6 K/uL 13.0 (H)  5.6    RBC Latest Ref Range: 4.40-5.90 MIL/uL 3.85 (L)  5.38    Hemoglobin Latest Ref Range: 13.0-18.0 g/dL 12.2 (L)  17.4    HCT Latest Ref Range: 40.0-52.0 % 35.8 (L)  50.7    MCV Latest Ref Range: 80.0-100.0 fL 93.0  94.2    MCH Latest Ref Range: 26.0-34.0 pg 31.8  32.2    MCHC Latest Ref Range: 32.0-36.0 g/dL 34.2  34.2    RDW Latest Ref Range: 11.5-14.5 % 13.6  13.6    Platelets Latest Ref Range: 150-440 K/uL 313  204    Neutrophils Latest Units: %   45    Lymphocytes Latest Units: %   35    Monocytes Relative Latest Units: %   16    Eosinophil Latest Units: %   3    Basophil Latest Units: %   1    NEUT# Latest Ref Range: 1.4-6.5 K/uL   2.6    Lymphocyte # Latest Ref Range: 1.0-3.6 K/uL   1.9    Monocyte # Latest Ref Range: 0.2-1.0 K/uL   0.9    Eosinophils Absolute Latest Ref Range: 0-0.7 K/uL   0.1    Basophils Absolute Latest Ref Range: 0-0.1 K/uL   0.0    Valproic Acid Lvl Latest Ref Range: 50.0-100.0 ug/mL   <10 (L)    Hemoglobin A1C Latest Ref Range: 4.0-6.0 %   5.0    TSH Latest  Ref Range: 0.350-4.500 uIU/mL   3.767    Appearance Latest Ref Range: CLEAR     CLEAR (A)   Bacteria, UA Latest Ref Range: NONE SEEN     NONE SEEN   Bilirubin Urine Latest Ref Range: NEGATIVE     NEGATIVE   Color, Urine Latest Ref Range: YELLOW     STRAW (A)   Glucose Latest Ref Range: NEGATIVE mg/dL    NEGATIVE   Hgb urine dipstick Latest Ref Range: NEGATIVE     NEGATIVE   Ketones, ur Latest Ref Range: NEGATIVE mg/dL    NEGATIVE    Leukocytes, UA Latest Ref Range: NEGATIVE     NEGATIVE   Mucous Unknown    PRESENT   Nitrite Latest Ref Range: NEGATIVE     NEGATIVE   pH Latest Ref Range: 5.0-8.0     6.0   Protein Latest Ref Range: NEGATIVE mg/dL    NEGATIVE   RBC / HPF Latest Ref Range: 0-5 RBC/hpf    NONE SEEN   Specific Gravity, Urine Latest Ref Range: 1.005-1.030     1.006   Squamous Epithelial / LPF Latest Ref Range: NONE SEEN     NONE SEEN   WBC, UA Latest Ref Range: 0-5 WBC/hpf    0-5   Alcohol, Ethyl (B) Latest Ref Range: <5 mg/dL   <5    Amphetamines, Ur Screen Latest Ref Range: NONE DETECTED     NONE DETECTED   Barbiturates, Ur Screen Latest Ref Range: NONE DETECTED     NONE DETECTED   Benzodiazepine, Ur Scrn Latest Ref Range: NONE DETECTED     NONE DETECTED   Cocaine Metabolite,Ur [place] Latest Ref Range: NONE DETECTED     NONE DETECTED   Methadone Scn, Ur Latest Ref Range: NONE DETECTED     NONE DETECTED   MDMA (Ecstasy)Ur Screen Latest Ref Range: NONE DETECTED     NONE DETECTED   Cannabinoid 50 Ng, Ur [place] Latest Ref Range: NONE DETECTED     NONE DETECTED   Opiate, Ur Screen Latest Ref Range: NONE DETECTED     NONE DETECTED   Phencyclidine (PCP) Ur S Latest Ref Range: NONE DETECTED     NONE DETECTED   Tricyclic, Ur Screen Latest Ref Range: NONE DETECTED     NONE DETECTED   DG TOE 3RD RIGHT Unknown     Rpt   Results for EHAN, FREAS (MRN [id number]) as of 11/13/2015 11:46  Ref. Range 11/13/2015 09:55  Valproic Acid Lvl Latest Ref Range: 50.0-100.0 ug/mL 64   EXAM: RIGHT THIRD TOE  COMPARISON: None.  FINDINGS: There is an acute transverse fracture through the distal shaft of the right third proximal phalanx without significant displacement. Third PIP and MTP joints remain approximated. No other fracture. Osseous mineralization normal. No significant degenerative changes. Mild soft tissue swelling of the third digit.  IMPRESSION: Acute transverse fracture through the distal shaft of the  right third proximal phalanx without significant displacement.       Discharge Instructions    Diet - low sodium heart healthy    Complete by:  As directed             Medication List    STOP taking these medications        buPROPion 300 MG 24 hr tablet  Commonly known as:  WELLBUTRIN XL     clonazePAM 1 MG tablet  Commonly known as:  KLONOPIN     hydrOXYzine 50 MG tablet  Commonly  known as:  ATARAX/VISTARIL     levofloxacin 750 MG tablet  Commonly known as:  LEVAQUIN     meloxicam 15 MG tablet  Commonly known as:  MOBIC      TAKE these medications      Indication   amLODipine 2.5 MG tablet  Commonly known as:  NORVASC  Take 2.5 mg by mouth daily.  Notes to Patient:  HTN      chlordiazePOXIDE 25 MG capsule  Commonly known as:  LIBRIUM  Take 1 capsule (25 mg total) by mouth 4 (four) times daily -  with meals and at bedtime.  Notes to Patient:  Taper off benzos      divalproex 500 MG 24 hr tablet  Commonly known as:  DEPAKOTE ER  Take 4 tablets (2,000 mg total) by mouth at bedtime.  Notes to Patient:  Mood-seizures      escitalopram 20 MG tablet  Commonly known as:  LEXAPRO  Take 20 mg by mouth every morning.  Notes to Patient:  depression      esomeprazole 40 MG capsule  Commonly known as:  NEXIUM  Take 40 mg by mouth daily.  Notes to Patient:  GERD      ibuprofen 800 MG tablet  Commonly known as:  ADVIL,MOTRIN  Take 1 tablet (800 mg total) by mouth every 8 (eight) hours as needed for moderate pain.  Notes to Patient:  Recent toe fracture-pain      Melatonin 5 MG Tabs  Take 10 mg by mouth at bedtime.  Notes to Patient:  insomnia      oxyCODONE-acetaminophen 5-325 MG tablet  Commonly known as:  PERCOCET/ROXICET  Take 1 tablet by mouth every 6 (six) hours as needed for severe pain.  Notes to Patient:  Pain-recent toe fracture. Only 10 tabs given      THEREMS-M Tabs  Take 1 tablet by mouth daily.  Notes to Patient:  Nutritional supplement       traZODone 100 MG tablet  Commonly known as:  DESYREL  Take 300 mg by mouth at bedtime.  Notes to Patient:  Depression and insomnia        Follow-up Information    Follow up with [facility] On 11/13/2015.   Why:  For follow-up care appt ACT will see patent today after discharge to group home   Contact information:   [place], [place] [phone] fax [phone]       Signed: Hildred Priest 11/13/2015, 11:48 AM

## 2015-11-13 NOTE — [facility] Group Notes (Signed)
[facility] LCSW Aftercare Discharge Planning Group Note   11/13/2015 10:06 AM  Participation Quality:  Did not attend.   Rafi Kenneth L Rawlins Stuard MSW, LCSWA     

## 2015-11-13 NOTE — [place] FL2 (Signed)
[place] MEDICAID FL2 LEVEL OF CARE SCREENING TOOL     IDENTIFICATION  Patient Name: [name] Birthdate: 1976-06-01 Sex: male Admission Date (Current Location): 11/11/2015  [place] and [place] Number:  [address] [id number] R Facility and Address:  [facility], [address] [place], [place] [address]      Provider Number: [id number]  Attending Physician Name and Address:  Barnabas Harries*  Relative Name and Phone Number:   [place] DSS Guardian [phone]    Current Level of Care: Hospital Recommended Level of Care: [facility] Prior Approval Number:    Date Approved/Denied:   PASRR Number:  [id number]   Discharge Plan: Other (Comment) ([facility] ([place] Sellars) [phone])    Current Diagnoses: Patient Active Problem List   Diagnosis Date Noted  . GERD (gastroesophageal reflux disease) 11/11/2015  . Fracture of third toe, right, closed 11/11/2015  . Borderline personality disorder 11/11/2015  . Alcohol use disorder, severe, in controlled environment (HCC) 11/11/2015  . 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 Seizure disorder (HCC) 11/05/2013  . Tobacco abuse 11/05/2013  . HTN (hypertension) 11/05/2013    Orientation RESPIRATION BLADDER Height & Weight    Self, Time, Situation, Place  Normal Continent      BEHAVIORAL SYMPTOMS/MOOD NEUROLOGICAL BOWEL NUTRITION STATUS      Continent    AMBULATORY STATUS COMMUNICATION OF NEEDS Skin   Independent   Normal                       Personal Care Assistance Level of Assistance  Bathing, Feeding, Dressing Bathing Assistance: Independent Feeding assistance: Independent Dressing Assistance: Independent     Functional Limitations Info  Sight, Hearing, Speech Sight Info: Adequate Hearing Info: Adequate Speech Info: Adequate    SPECIAL CARE FACTORS FREQUENCY                        Contractures      Additional Factors Info  Psychotropic               Current Medications (11/13/2015):  This is the current hospital active medication list Current Facility-Administered Medications  Medication Dose Route Frequency Provider Last Rate Last Dose  . acetaminophen (TYLENOL) tablet 650 mg  650 mg Oral Q6H PRN Jimmy Footman, MD      . alum & mag hydroxide-simeth (MAALOX/MYLANTA) 200-200-20 MG/5ML suspension 30 mL  30 mL Oral Q4H PRN Jimmy Footman, MD      . amLODipine (NORVASC) tablet 2.5 mg  2.5 mg Oral Daily Jimmy Footman, MD   2.5 mg at 11/13/15 1004  . chlordiazePOXIDE (LIBRIUM) capsule 50 mg  50 mg Oral TID Jimmy Footman, MD   50 mg at 11/13/15 1005  . divalproex (DEPAKOTE ER) 24 hr tablet 2,000 mg  2,000 mg Oral QHS Jimmy Footman, MD   2,000 mg at 11/12/15 2232  . escitalopram (LEXAPRO) tablet 20 mg  20 mg Oral Daily Jimmy Footman, MD   20 mg at 11/13/15 1005  . hydrOXYzine (ATARAX/VISTARIL) tablet 25 mg  25 mg Oral TID PRN Jimmy Footman, MD   25 mg at 11/11/15 2008  . magnesium hydroxide (MILK OF MAGNESIA) suspension 30 mL  30 mL Oral Daily PRN Jimmy Footman, MD      . nicotine (NICODERM CQ - dosed in mg/24 hours) patch 21 mg  21 mg Transdermal Daily Jimmy Footman, MD   21 mg at 11/13/15 1007  .  oxyCODONE-acetaminophen (PERCOCET/ROXICET) 5-325 MG per tablet 1 tablet  1 tablet Oral Q6H PRN Jimmy FootmanAndrea Hernandez-Gonzalez, MD   1 tablet at 11/13/15 1005  . pantoprazole (PROTONIX) EC tablet 40 mg  40 mg Oral QAC breakfast Jimmy FootmanAndrea Hernandez-Gonzalez, MD   40 mg at 11/13/15 1005  . traZODone (DESYREL) tablet 300 mg  300 mg Oral QHS Jimmy FootmanAndrea Hernandez-Gonzalez, MD   300 mg at 11/12/15 2233     Discharge Medications: Please see discharge summary for a list of discharge medications.  Relevant Imaging Results:  Relevant Lab Results:   Additional Information     Gennette Pacngle, Juliene Kirsh T, LCSW [phone]

## 2015-11-13 NOTE — Progress Notes (Signed)
Patient ID: [name], male   DOB: 10/14/1976, 39 y.o.   MRN: [id number]  Patient endorses passive SI but agrees to be discharged to group home and verbalizes that he knows resources to call if he were to feel unsafe. Patient denies HI and AVH and pain at this time. Discharge instructions reviewed with patient. All belongings returned to patient. Patient to go to group home via cab voucher.

## 2015-11-13 NOTE — [facility] Suicide Risk Assessment (Signed)
[facility] INPATIENT:  Family/Significant Other Suicide Prevention Education  Suicide Prevention Education:  Education Completed; SPE was attempted with patient's group home ([facility]) [phone] but was unsuccessful. CSW also attempted SPE with patient's guardian Saintclair HalstedRhesha Carr [phone] and left voicemail but was able to provide SPE with the on-call Social Worker for the guardian for the Holiday and has been identified by the patient as the person(s) who will aid the patient in the event of a mental health crisis (suicidal ideations/suicide attempt).  With written consent from the patient, the family member/significant other has been provided the following suicide prevention education, prior to the and/or following the discharge of the patient.  The suicide prevention education provided includes the following:  Suicide risk factors  Suicide prevention and interventions  National Suicide Hotline telephone number  [facility] telephone number  [facility]  [facility] and/or Residential Mobile Crisis Unit telephone number  Request made of family/significant other to:  Remove weapons (e.g., guns, rifles, knives), all items previously/currently identified as safety concern.    Remove drugs/medications (over-the-counter, prescriptions, illicit drugs), all items previously/currently identified as a safety concern.  The family member/significant other verbalizes understanding of the suicide prevention education information provided.  The family member/significant other agrees to remove the items of safety concern listed above.  Lulu RidingIngle, Aryaa Bunting T, MSW, LCSWA 11/13/2015, 11:05 AM

## 2015-11-13 NOTE — [facility] Suicide Risk Assessment (Signed)
[facility]   Demographic Factors:  Male and Caucasian  Total Time spent with patient: 30 minutes  Psychiatric Specialty Exam: Physical Exam  ROS                                                         Have you used any form of tobacco in the last 30 days? (Cigarettes, Smokeless Tobacco, Cigars, and/or Pipes): Yes  Has this patient used any form of tobacco in the last 30 days? (Cigarettes, Smokeless Tobacco, Cigars, and/or Pipes) Yes, A prescription for an FDA-approved tobacco cessation medication was offered at discharge and the patient refused  Mental Status Per Nursing Assessment::   On Admission:     Current Mental Status by Physician: Pleasant, calm, friendly and cooperative. Denies depression, SI, HI. There is no evidence of mania, hypomania or psychosis. Compliant with medications. Attending groups.  Loss Factors: NA  Historical Factors: Prior suicide attempts and Impulsivity  Risk Reduction Factors:   Sense of responsibility to family and Positive social support  Continued Clinical Symptoms:  Alcohol/Substance Abuse/Dependencies Personality Disorders:   Cluster B Comorbid alcohol abuse/dependence Comorbid depression Previous Psychiatric Diagnoses and Treatments  Cognitive Features That Contribute To Risk:  Closed-mindedness    Suicide Risk:  Minimal: No identifiable suicidal ideation.  Patients presenting with no risk factors but with morbid ruminations; may be classified as minimal risk based on the severity of the depressive symptoms  Principal Problem: MDD (major depressive disorder), recurrent severe, without psychosis [facility](HCC) Discharge Diagnoses:  Patient Active Problem List   Diagnosis Date Noted  . GERD (gastroesophageal reflux disease) [K21.9] 11/11/2015  . Fracture of third toe, right, closed [S92.501A] 11/11/2015  . Borderline personality disorder [F60.3] 11/11/2015  . Alcohol use disorder, severe,  in controlled environment (HCC) [F10.20] 11/11/2015  . MDD (major depressive disorder), recurrent severe, without psychosis (HCC) [F33.2] 11/08/2015  . Seizure disorder (HCC) [G40.909] 11/05/2013  . Tobacco abuse [Z72.0] 11/05/2013  . HTN (hypertension) [I10] 11/05/2013      Is patient on multiple antipsychotic therapies at discharge:  No   Has Patient had three or more failed trials of antipsychotic monotherapy by history:  No  Recommended Plan for Multiple Antipsychotic Therapies: NA    [name],  [name] 11/13/2015, 7:33 AM

## 2015-11-13 NOTE — Progress Notes (Signed)
D- Patient in room resting upon approach. Patient endorses SI but states that he has no concrete plan. Contracts for safety while in the hospital. Patient denies HI and AVH. Patient maintains a depressed and anxious affect and states that he does not wish to go to his group home because they do not treat him well there. He states, "I'd rather be under a bridge than go there". Patient endorses severe pain rated a 8/10 in his middle toe of right foot.  A- Nurse provided reassurance and helped maintain a safe environment. Every 15 minute checks completed for safety. Provided scheduled medications and gave percoset for toe pain.  R-  Patient compliant with treatment and is visible in the milieu. Safety maintained.

## 2015-11-20 IMAGING — CR DG CHEST 1V PORT
1 series · 1 of 1 positions shown · non-contrast
Comparison: 10/05/2013

CLINICAL DATA: Central line placement.  Right rib fractures.

EXAM:
PORTABLE CHEST - 1 VIEW

[ap]
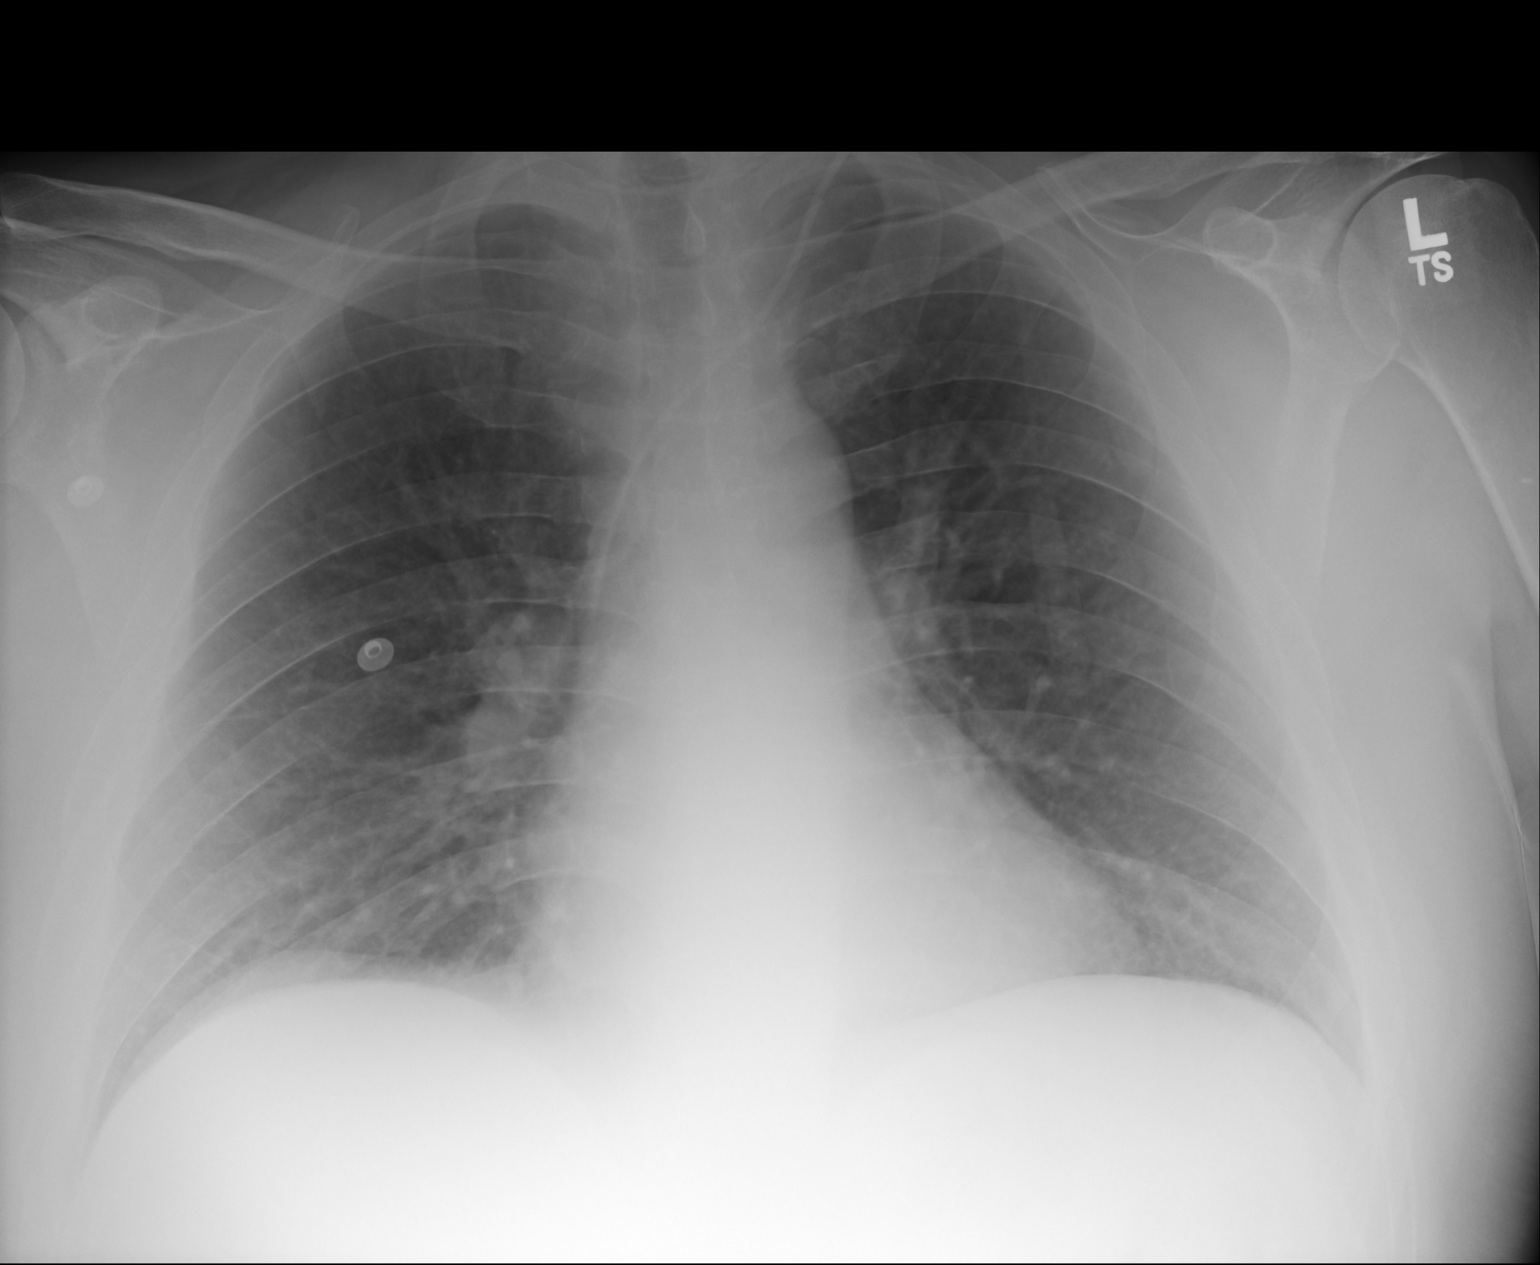

[1 of 1 positions shown; findings below may reference images not displayed]

FINDINGS: Interval placement of a left central venous catheter with tip over
the mid SVC region. No pneumothorax. Heart size and pulmonary
vascularity are normal. No focal airspace disease in the lungs. No
blunting of costophrenic angles. Previously identified right rib
fractures are not well visualized on this view.
IMPRESSION: Left central venous catheter appears in satisfactory location. No
pneumothorax.

## 2015-11-22 DIAGNOSIS — J189 Pneumonia, unspecified organism: Secondary | ICD-10-CM

## 2015-11-22 HISTORY — DX: Pneumonia, unspecified organism: J18.9

## 2015-12-11 IMAGING — CR DG RIBS W/ CHEST 3+V BILAT
5 series · 5 of 5 positions shown · non-contrast
Comparison: None.

CLINICAL DATA: Chest wall pain status post fall

EXAM:
BILATERAL RIBS AND CHEST - 4+ VIEW

[w chest pa]
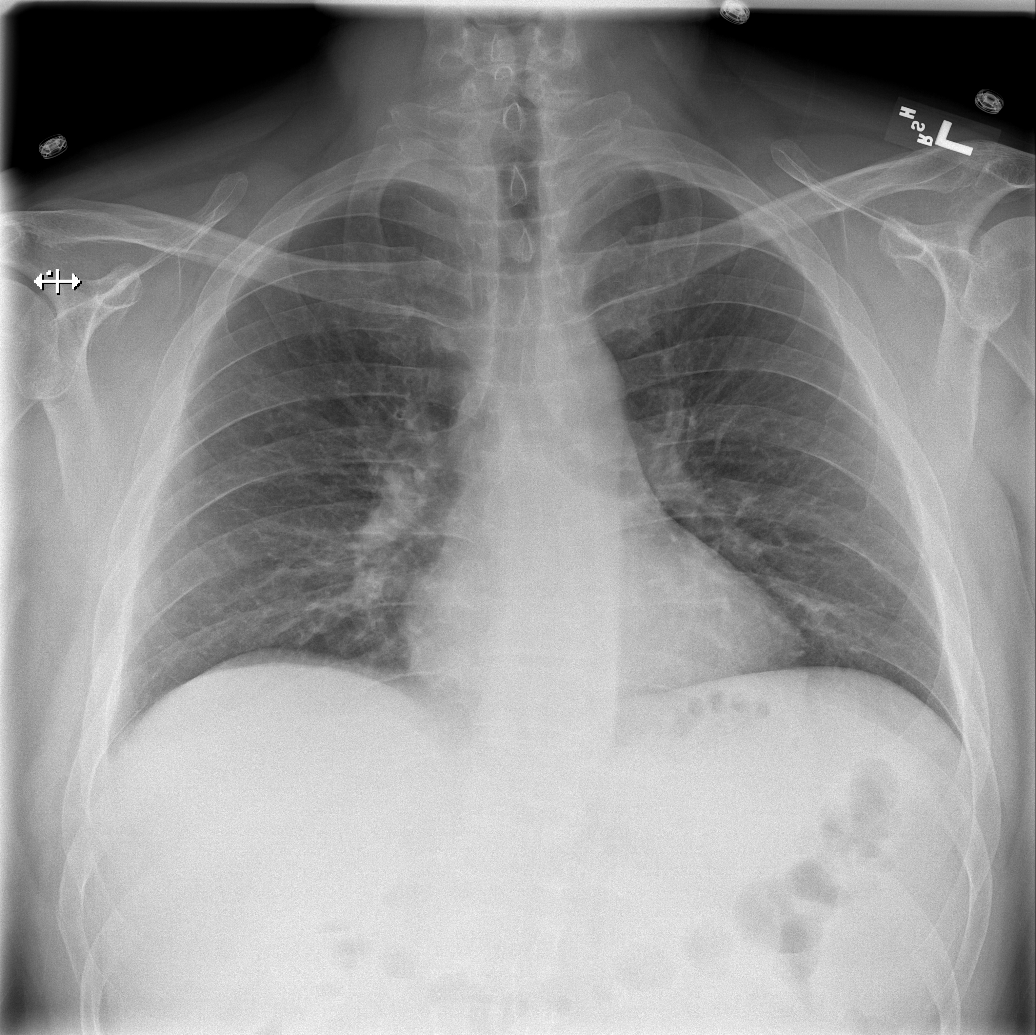

[w ribs pa lower left]
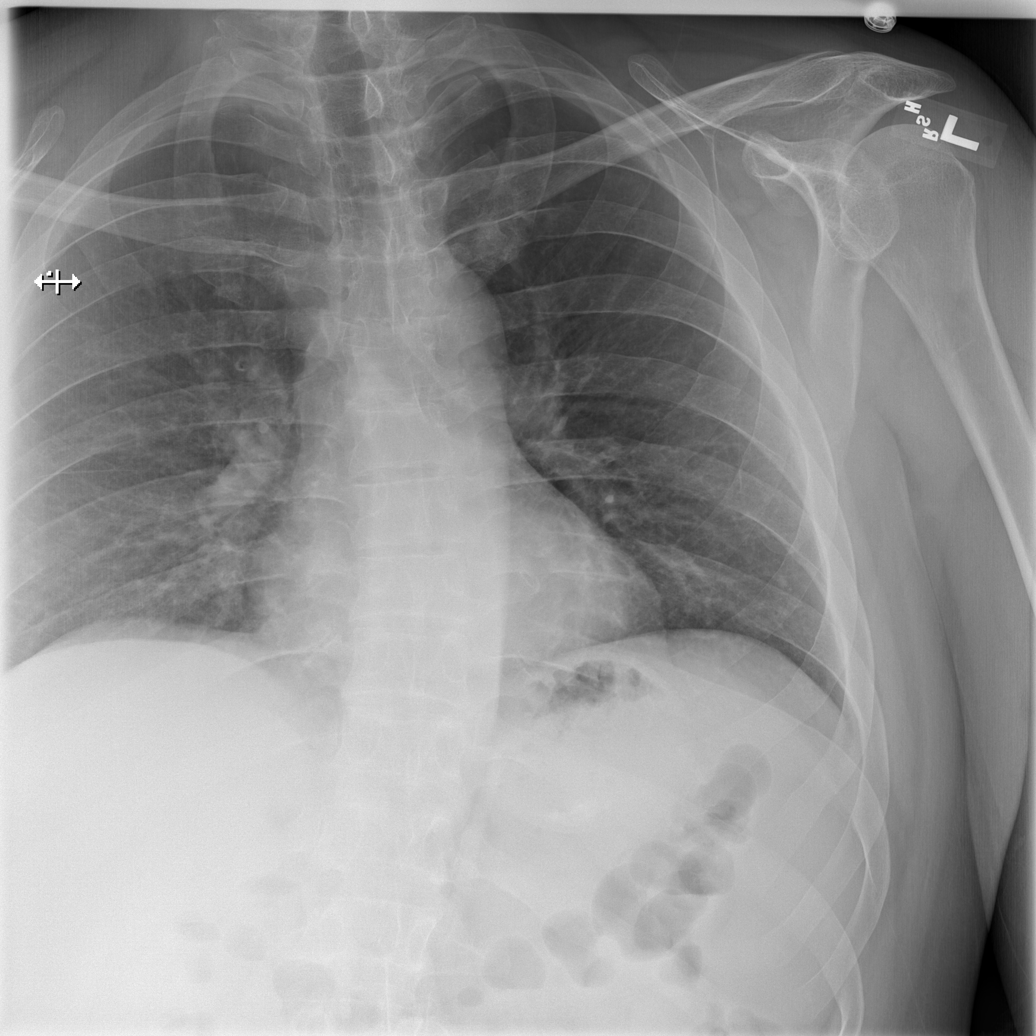

[w ribs obl left]
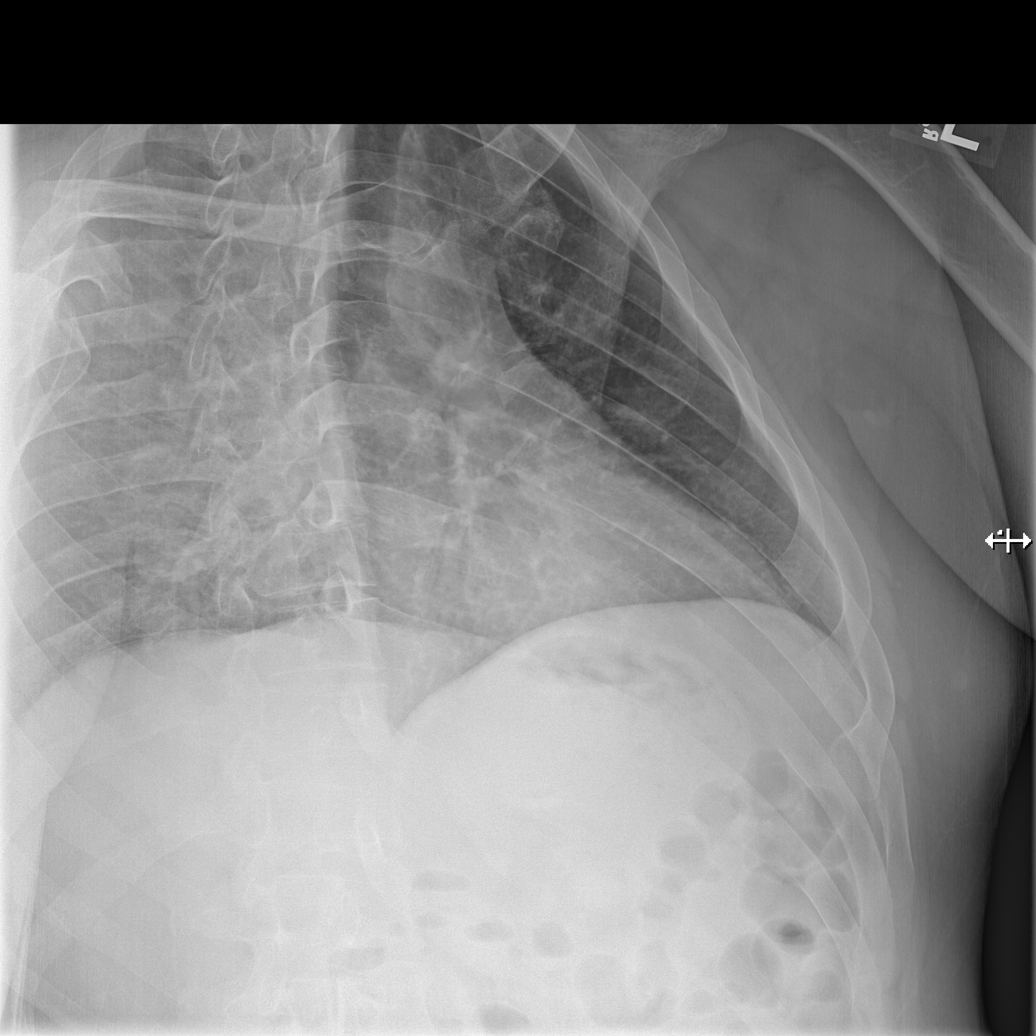

[w ribs pa lower right]
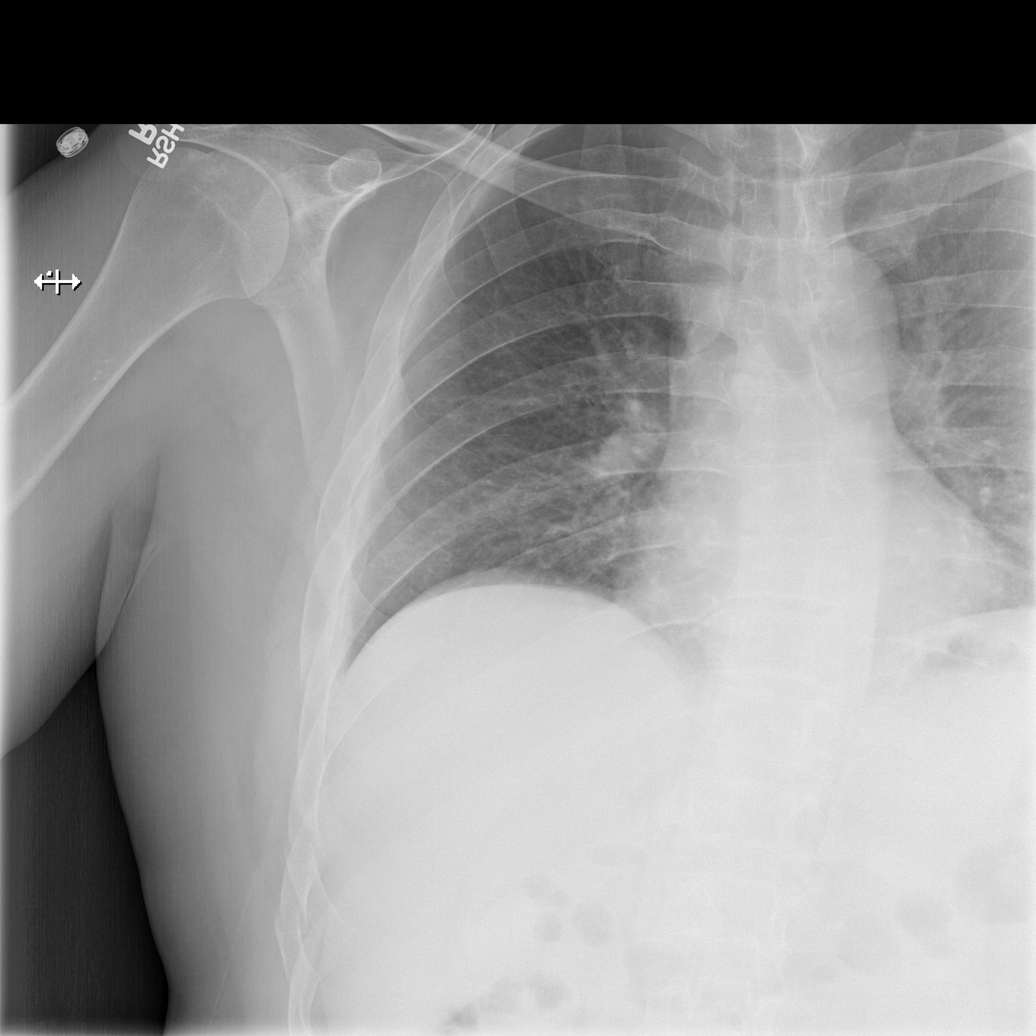

[w ribs obl right]
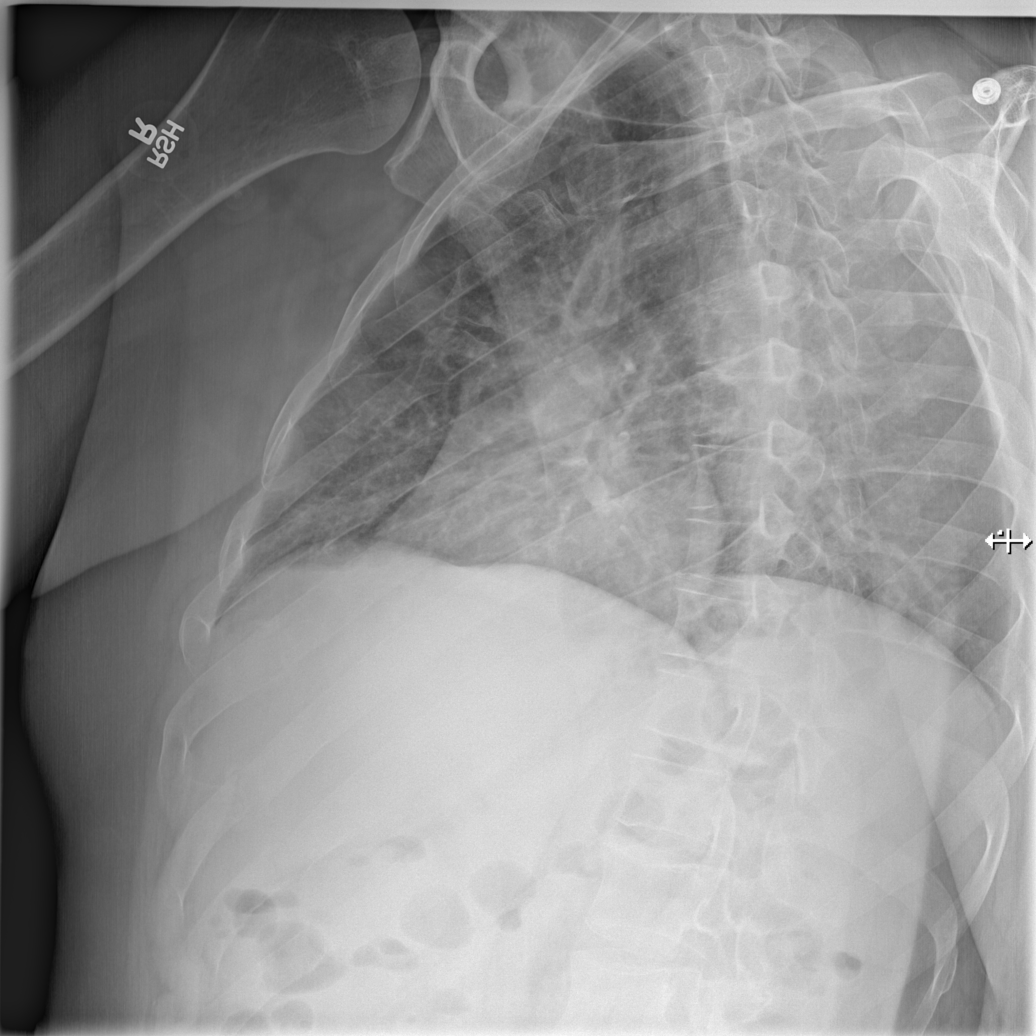

[5 of 5 positions shown; findings below may reference images not displayed]

FINDINGS: Evaluation the ribs demonstrates multiple fractures involving the
lateral aspect of the 5th through the 10th ribs. The fractures
involving the 9th and 10th ribs appear to be mildly depressed. There
is no evidence of a pneumothorax. Correlation with flail chest is
recommended. No appreciable fractures identified involving the ribs
on the left. Low lung volumes. There is no evidence of pneumothorax
or focal regions of consolidation or focal infiltrates.
IMPRESSION: Multiple rib fractures on the right as described above

## 2015-12-30 IMAGING — CR DG CHEST 1V PORT
1 series · 1 of 1 positions shown · non-contrast
Comparison: DG CHEST 1V PORT dated 10/15/2013

CLINICAL DATA: insulin overdose, hyperglycemia

EXAM:
PORTABLE CHEST - 1 VIEW

[ap]
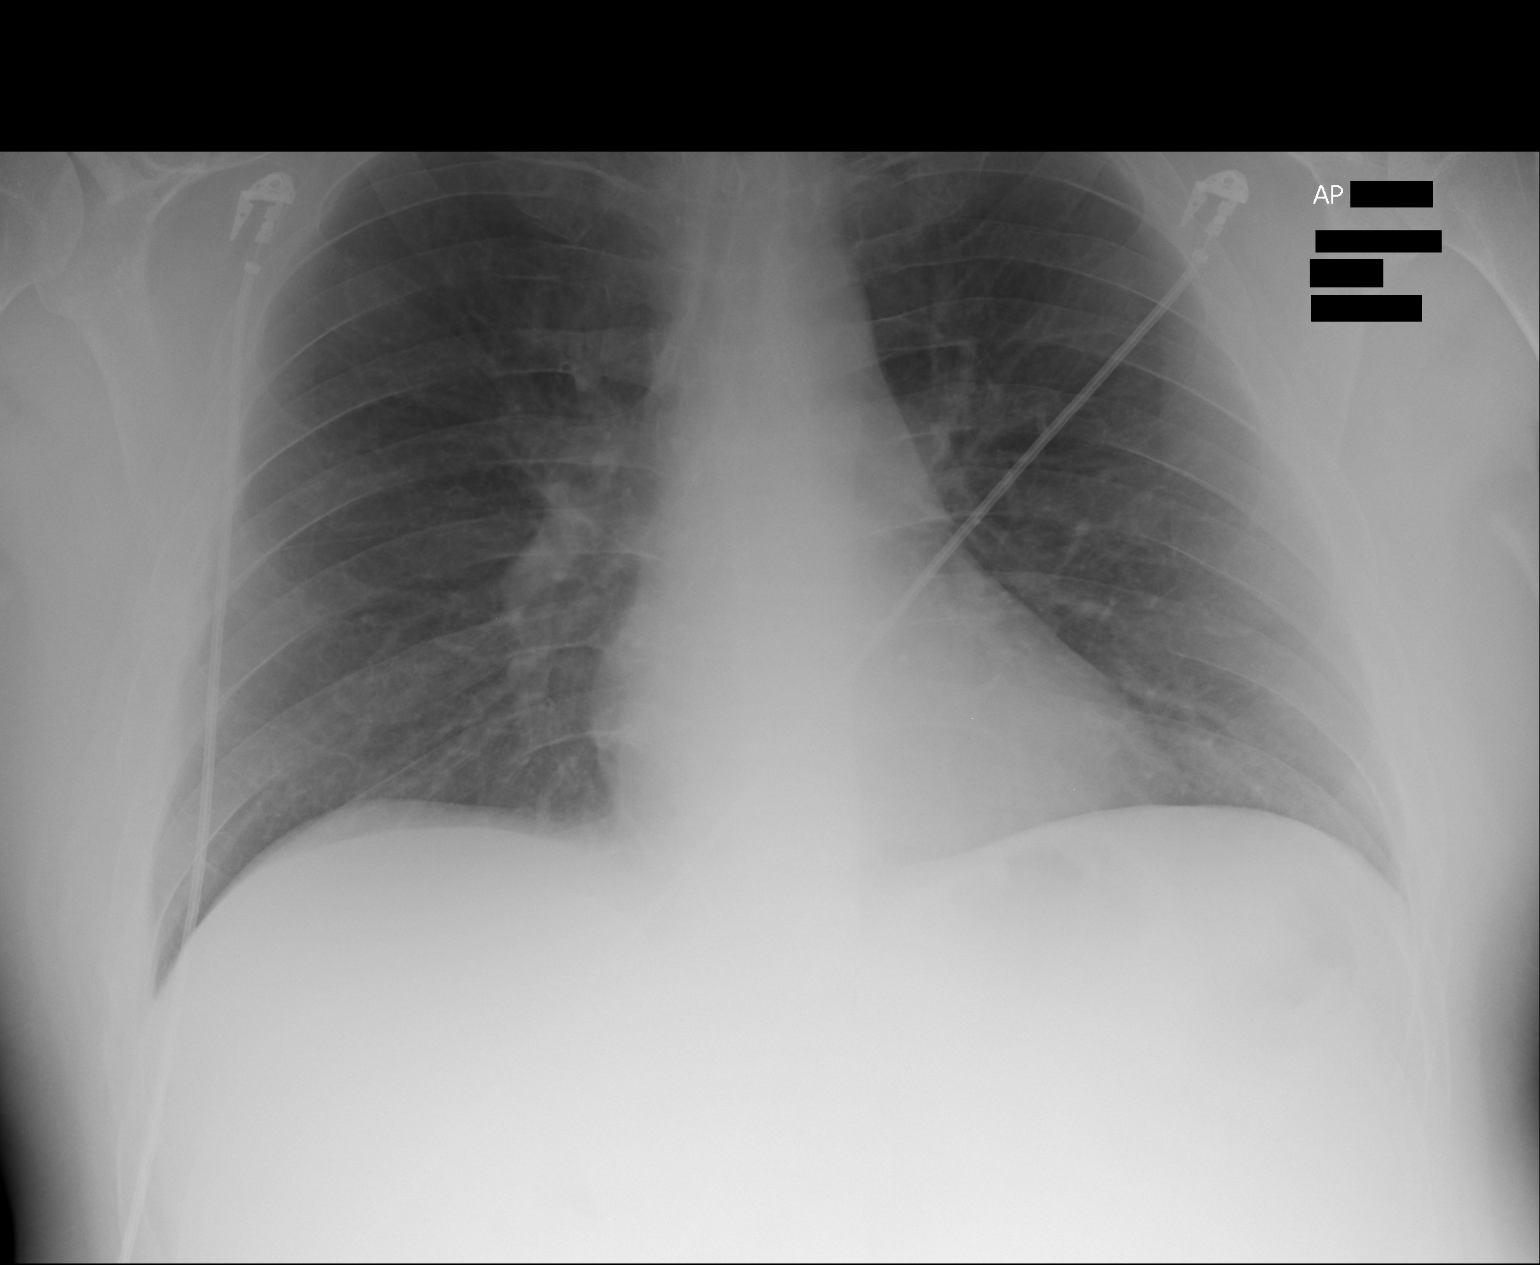

[1 of 1 positions shown; findings below may reference images not displayed]

FINDINGS: There is no focal parenchymal opacity, pleural effusion, or
pneumothorax. The heart and mediastinal contours are unremarkable.

The osseous structures are unremarkable.
IMPRESSION: No active disease.

## 2015-12-31 ENCOUNTER — Inpatient Hospital Stay (HOSPITAL_COMMUNITY)
Admission: EM | Admit: 2015-12-31 | Discharge: 2016-01-11 | DRG: 163 | Disposition: A | Discharge status: Home Health | Payer: Medicare Other | Attending: Internal Medicine | Admitting: Internal Medicine

## 2015-12-31 ENCOUNTER — Emergency Department (HOSPITAL_COMMUNITY): Payer: Medicare Other

## 2015-12-31 ENCOUNTER — Encounter (HOSPITAL_COMMUNITY): Payer: Self-pay | Admitting: *Deleted

## 2015-12-31 DIAGNOSIS — G9340 Encephalopathy, unspecified: Secondary | ICD-10-CM | POA: Diagnosis not present

## 2015-12-31 DIAGNOSIS — J852 Abscess of lung without pneumonia: Secondary | ICD-10-CM | POA: Diagnosis not present

## 2015-12-31 DIAGNOSIS — E871 Hypo-osmolality and hyponatremia: Secondary | ICD-10-CM | POA: Diagnosis not present

## 2015-12-31 DIAGNOSIS — J984 Other disorders of lung: Secondary | ICD-10-CM

## 2015-12-31 DIAGNOSIS — D62 Acute posthemorrhagic anemia: Secondary | ICD-10-CM | POA: Diagnosis not present

## 2015-12-31 DIAGNOSIS — F603 Borderline personality disorder: Secondary | ICD-10-CM | POA: Diagnosis present

## 2015-12-31 DIAGNOSIS — F329 Major depressive disorder, single episode, unspecified: Secondary | ICD-10-CM | POA: Diagnosis present

## 2015-12-31 DIAGNOSIS — T43215A Adverse effect of selective serotonin and norepinephrine reuptake inhibitors, initial encounter: Secondary | ICD-10-CM | POA: Diagnosis not present

## 2015-12-31 DIAGNOSIS — N179 Acute kidney failure, unspecified: Secondary | ICD-10-CM

## 2015-12-31 DIAGNOSIS — K219 Gastro-esophageal reflux disease without esophagitis: Secondary | ICD-10-CM | POA: Diagnosis present

## 2015-12-31 DIAGNOSIS — J189 Pneumonia, unspecified organism: Secondary | ICD-10-CM | POA: Diagnosis present

## 2015-12-31 DIAGNOSIS — F419 Anxiety disorder, unspecified: Secondary | ICD-10-CM | POA: Diagnosis present

## 2015-12-31 DIAGNOSIS — Z833 Family history of diabetes mellitus: Secondary | ICD-10-CM

## 2015-12-31 DIAGNOSIS — R569 Unspecified convulsions: Secondary | ICD-10-CM | POA: Diagnosis not present

## 2015-12-31 DIAGNOSIS — Z79899 Other long term (current) drug therapy: Secondary | ICD-10-CM

## 2015-12-31 DIAGNOSIS — J869 Pyothorax without fistula: Secondary | ICD-10-CM | POA: Diagnosis not present

## 2015-12-31 DIAGNOSIS — F17210 Nicotine dependence, cigarettes, uncomplicated: Secondary | ICD-10-CM | POA: Diagnosis present

## 2015-12-31 DIAGNOSIS — R4182 Altered mental status, unspecified: Secondary | ICD-10-CM | POA: Diagnosis not present

## 2015-12-31 DIAGNOSIS — Z4682 Encounter for fitting and adjustment of non-vascular catheter: Secondary | ICD-10-CM

## 2015-12-31 DIAGNOSIS — J939 Pneumothorax, unspecified: Secondary | ICD-10-CM

## 2015-12-31 DIAGNOSIS — R197 Diarrhea, unspecified: Secondary | ICD-10-CM | POA: Diagnosis not present

## 2015-12-31 DIAGNOSIS — D649 Anemia, unspecified: Secondary | ICD-10-CM | POA: Diagnosis not present

## 2015-12-31 DIAGNOSIS — I10 Essential (primary) hypertension: Secondary | ICD-10-CM | POA: Diagnosis present

## 2015-12-31 DIAGNOSIS — J948 Other specified pleural conditions: Secondary | ICD-10-CM | POA: Diagnosis present

## 2015-12-31 DIAGNOSIS — J90 Pleural effusion, not elsewhere classified: Secondary | ICD-10-CM | POA: Diagnosis present

## 2015-12-31 LAB — BASIC METABOLIC PANEL
Anion gap: 13 (ref 5–15)
BUN: 21 mg/dL — AB (ref 6–20)
CHLORIDE: 89 mmol/L — AB (ref 101–111)
CO2: 28 mmol/L (ref 22–32)
Calcium: 8.1 mg/dL — ABNORMAL LOW (ref 8.9–10.3)
Creatinine, Ser: 0.88 mg/dL (ref 0.61–1.24)
GFR calc Af Amer: >60 mL/min (ref >60)
GFR calc non Af Amer: >60 mL/min (ref >60)
GLUCOSE: 112 mg/dL — AB (ref 65–99)
POTASSIUM: 4.5 mmol/L (ref 3.5–5.1)
Sodium: 130 mmol/L — ABNORMAL LOW (ref 135–145)

## 2015-12-31 LAB — CBC WITH DIFFERENTIAL/PLATELET
Basophils Absolute: 0 10*3/uL (ref 0.0–0.1)
Basophils Relative: 0 %
EOS PCT: 0 %
Eosinophils Absolute: 0 10*3/uL (ref 0.0–0.7)
HEMATOCRIT: 36.5 % — AB (ref 39.0–52.0)
Hemoglobin: 12.3 g/dL — ABNORMAL LOW (ref 13.0–17.0)
LYMPHS ABS: 1.9 10*3/uL (ref 0.7–4.0)
LYMPHS PCT: 9 %
MCH: 29.4 pg (ref 26.0–34.0)
MCHC: 33.7 g/dL (ref 30.0–36.0)
MCV: 87.1 fL (ref 78.0–100.0)
MONO ABS: 2.4 10*3/uL — AB (ref 0.1–1.0)
Monocytes Relative: 11 %
Neutro Abs: 17.2 10*3/uL — ABNORMAL HIGH (ref 1.7–7.7)
Neutrophils Relative %: 80 %
Platelets: 271 10*3/uL (ref 150–400)
RBC: 4.19 MIL/uL — ABNORMAL LOW (ref 4.22–5.81)
RDW: 13.2 % (ref 11.5–15.5)
WBC: 21.5 10*3/uL — ABNORMAL HIGH (ref 4.0–10.5)

## 2015-12-31 LAB — I-STAT TROPONIN, ED: TROPONIN I, POC: 0 ng/mL (ref 0.00–0.08)

## 2015-12-31 LAB — BRAIN NATRIURETIC PEPTIDE: B Natriuretic Peptide: 62.6 pg/mL (ref 0.0–100.0)

## 2015-12-31 MED ORDER — PIPERACILLIN-TAZOBACTAM 3.375 G IVPB 30 MIN
3.3750 g | Freq: Once | INTRAVENOUS | Status: AC
  Administered 2015-12-31: 3.375 g via INTRAVENOUS
  Filled 2015-12-31: qty 50

## 2015-12-31 MED ORDER — HEPARIN SODIUM (PORCINE) 5000 UNIT/ML IJ SOLN: 5000.0000 [IU] | Freq: Three times a day (TID) | INTRAMUSCULAR | Status: DC

## 2015-12-31 MED ORDER — SODIUM CHLORIDE 0.9 % IV SOLN
INTRAVENOUS | Status: DC
  Administered 2016-01-01: 01:00:00 via INTRAVENOUS

## 2015-12-31 MED ORDER — PIPERACILLIN-TAZOBACTAM 3.375 G IVPB
3.3750 g | Freq: Three times a day (TID) | INTRAVENOUS | Status: DC
  Administered 2016-01-01 – 2016-01-05 (×13): 3.375 g via INTRAVENOUS
  Filled 2015-12-31 (×16): qty 50

## 2015-12-31 MED ORDER — IOHEXOL 300 MG/ML  SOLN
75.0000 mL | Freq: Once | INTRAMUSCULAR | Status: AC | PRN
  Administered 2015-12-31: 100 mL via INTRAVENOUS

## 2015-12-31 MED ORDER — VANCOMYCIN HCL IN DEXTROSE 1-5 GM/200ML-% IV SOLN
1000.0000 mg | Freq: Once | INTRAVENOUS | Status: AC
  Administered 2015-12-31: 1000 mg via INTRAVENOUS
  Filled 2015-12-31: qty 200

## 2015-12-31 MED ORDER — VANCOMYCIN HCL IN DEXTROSE 1-5 GM/200ML-% IV SOLN
1000.0000 mg | Freq: Three times a day (TID) | INTRAVENOUS | Status: DC
  Administered 2016-01-01 (×2): 1000 mg via INTRAVENOUS
  Filled 2015-12-31 (×5): qty 200

## 2015-12-31 NOTE — ED Notes (Signed)
Patient transported to CT 

## 2015-12-31 NOTE — ED Provider Notes (Signed)
Medical screening examination/treatment/procedure(s) were conducted as a shared visit with non-physician practitioner(s) and myself.  I personally evaluated the patient during the encounter.   EKG Interpretation   Date/Time:  Thursday December 31 2015 20:00:36 EST Ventricular Rate:  87 PR Interval:  104 QRS Duration: 83 QT Interval:  279 QTC Calculation: 335 R Axis:   86 Text Interpretation:  Sinus rhythm Short PR interval RSR' in V1 or V2,  right VCD or RVH Confirmed by Orris Perin MD, Jamyla Ard ([id number]) on 12/31/2015 9:23:14  PM     40 y.o. male presents with shortness of breath. Large right sided effusion, Pt has had effusions with PNA previously according to him. No acute distress on exam. CT for further characterization and will cover empirically for MRSA pneumonia with high WBC count and possible empyema. Will need thoracentesis for definitive evaluation, admit to medicine.  Emergency Focused Ultrasound Exam Limited Thorax   Performed and interpreted by Dr Clydene Pugh Longitudinal view of anterior left and right lung fields in real-time with linear probe. Indication: shortness of breath Findings: + lung sliding + B lines on left, right side with large simple pleural effusion and collapsed parenchyma Interpretation: Large right pleural effusion of indeterminate sterility, no evidence of pneumothorax. Images electronically archived.    CPT code: 21308   Procedure note: Ultrasound Guided Peripheral IV Ultrasound guided peripheral 1.88 inch angiocath IV placement performed by me. Indications: Nursing unable to place IV. Details: The antecubital fossa and upper arm were evaluated with a multifrequency linear probe. Patent brachial veins were noted. 1 attempt was made to cannulate a vein under realtime US guidance with successful cannulation of the vein and catheter placement. There is return of non-pulsatile dark red blood. The patient tolerated the procedure well without complications. Images  archived electronically.  CPT codes: 65784 and 69629   See related encounter note   Lyndal Pulley, MD 01/01/16 [phone]

## 2015-12-31 NOTE — ED Notes (Signed)
Patient transported to X-ray 

## 2015-12-31 NOTE — Progress Notes (Signed)
Pharmacy Antibiotic Note  [name] is a 40 y.o. male admitted on 12/31/2015 with pneumonia.  Pharmacy has been consulted for vancomycin and zosyn dosing. Pt presents with SOB and large R-sided effusion.  Plan: Vancomycin 1g IV every 8 hours.  Goal trough 15-20 mcg/mL. Zosyn 3.375g IV q8h (4 hour infusion).  Monitor culture data, renal function and clinical course VT at SS prn  Height:  (180.3 cm) Weight: 211 lb (95.709 kg) IBW/kg (Calculated) : 75.3  Temp (24hrs), Avg:98.1 F (36.7 C), Min:98.1 F (36.7 C), Max:98.1 F (36.7 C)   Recent Labs Lab 12/31/15 2000  WBC 21.5*  CREATININE 0.88    Estimated Creatinine Clearance: 131.8 mL/min (by C-G formula based on Cr of 0.88).    No Known Allergies  Antimicrobials this admission: Vanc 2/9 >>  Zosyn 2/9 >>   Dose adjustments this admission: n/a  Microbiology results:  BCx:   UCx:    Sputum:    MRSA PCR:   Arlean Hopping. Newman Pies, PharmD, BCPS Clinical Pharmacist Pager [phone]  Marquita Palms 12/31/2015 9:27 PM

## 2015-12-31 NOTE — ED Provider Notes (Signed)
CSN: [id number]     Arrival date & time 12/31/15  1835 History   First MD Initiated Contact with Patient 12/31/15 1847     Chief Complaint  Patient presents with  . Nasal Congestion  . Cough     (Consider location/radiation/quality/duration/timing/severity/associated sxs/prior Treatment) HPI [name] is a 40 y.o. male from [facility] via EMS, history of hypertension, GERD and seizures on Depakote therapy comes in for evaluation of nasal congestion and cough. Patient reports over the past 3 or 4 weeks he has had constant chest pain with associated cough and nasal congestion. Chest discomfort is worsened with cough and is nonexertional. Per nursing, reports associated shortness of breath prior to arrival which resolved with 2 puffs of albuterol inhaler. Patient appears very tired during exam is not fully cooperative. Will answer questions when asked directly. Reports he has been taking his Depakote for seizures as prescribed. Denies any other illicit drug use. Denies alcohol use. Smokes one pack of cigarettes every 3 days.  Past Medical History  Diagnosis Date  . Depression   . Anxiety   . Hypertension   . GERD (gastroesophageal reflux disease)   . Seizures [facility])    Past Surgical History  Procedure Laterality Date  . Finger surgery Left     5th digit   Family History  Problem Relation Age of Onset  . CAD Mother   . Diabetes Mellitus II Father    Social History  Substance Use Topics  . Smoking status: Current Every Day Smoker -- 0.50 packs/day    Types: Cigarettes  . Smokeless tobacco: None  . Alcohol Use: No     Comment: pt states he no longer drinks    Review of Systems A 10 point review of systems was completed and was negative except for pertinent positives and negatives as mentioned in the history of present illness     Allergies  Review of patient's allergies indicates no known allergies.  Home Medications   Prior to Admission medications    Medication Sig Start Date End Date Taking? Authorizing Provider  amLODipine (NORVASC) 2.5 MG tablet Take 2.5 mg by mouth daily.   Yes Historical Provider, MD  buPROPion (WELLBUTRIN XL) 300 MG 24 hr tablet Take 300 mg by mouth daily.   Yes Historical Provider, MD  chlordiazePOXIDE (LIBRIUM) 25 MG capsule Take 1 capsule (25 mg total) by mouth 4 (four) times daily -  with meals and at bedtime. 11/13/15  Yes Jimmy Footman, MD  clonazePAM (KLONOPIN) 1 MG tablet Take 1-2 mg by mouth 3 (three) times daily. Take 1 mg every morning  Take 2 mg every evening and at bedtime   Yes Historical Provider, MD  divalproex (DEPAKOTE ER) 500 MG 24 hr tablet Take 4 tablets (2,000 mg total) by mouth at bedtime. 11/13/15  Yes Jimmy Footman, MD  escitalopram (LEXAPRO) 20 MG tablet Take 20 mg by mouth every morning.   Yes Historical Provider, MD  esomeprazole (NEXIUM) 40 MG capsule Take 40 mg by mouth daily.    Yes Historical Provider, MD  Fluticasone-Salmeterol (ADVAIR) 250-50 MCG/DOSE AEPB Inhale 1 puff into the lungs 2 (two) times daily.   Yes Historical Provider, MD  hydrOXYzine (ATARAX/VISTARIL) 50 MG tablet Take 50 mg by mouth 3 (three) times daily as needed for anxiety.   Yes Historical Provider, MD  Melatonin 5 MG TABS Take 10 mg by mouth at bedtime.   Yes Historical Provider, MD  meloxicam (MOBIC) 15 MG tablet Take 15  mg by mouth daily.   Yes Historical Provider, MD  Multiple Vitamins-Minerals (THEREMS-M) TABS Take 1 tablet by mouth daily.   Yes Historical Provider, MD  traZODone (DESYREL) 100 MG tablet Take 300 mg by mouth at bedtime.   Yes Historical Provider, MD   BP 119/76 mmHg  Pulse 86  Temp(Src) 98.1 F (36.7 C) (Oral)  Resp 13  Ht 5\' 11"  (1.803 m)  Wt 95.709 kg  BMI 29.44 kg/m2  SpO2 91% Physical Exam  Constitutional: He is oriented to person, place, and time. He appears well-developed and well-nourished.  Appear somewhat tired and somnolent but arouses easily by verbal  command  HENT:  Head: Normocephalic and atraumatic.  Dry mucous membranes  Eyes: Conjunctivae are normal. Pupils are equal, round, and reactive to light. Right eye exhibits no discharge. Left eye exhibits no discharge. No scleral icterus.  Neck: Neck supple.  Cardiovascular: Normal rate, regular rhythm and normal heart sounds.   Pulmonary/Chest: No respiratory distress.  Poor effort, unable to effectively auscultate  Abdominal: Soft. There is no tenderness.  Musculoskeletal: He exhibits no tenderness.  Neurological: He is alert and oriented to person, place, and time.  Cranial Nerves II-XII grossly intact  Skin: Skin is warm and dry. No rash noted.  Psychiatric: He has a normal mood and affect.  Nursing note and vitals reviewed.   ED Course  Procedures (including critical care time) Labs Review Labs Reviewed  BASIC METABOLIC PANEL - Abnormal; Notable for the following:    Sodium 130 (*)    Chloride 89 (*)    Glucose, Bld 112 (*)    BUN 21 (*)    Calcium 8.1 (*)    All other components within normal limits  CBC WITH DIFFERENTIAL/PLATELET - Abnormal; Notable for the following:    WBC 21.5 (*)    RBC 4.19 (*)    Hemoglobin 12.3 (*)    HCT 36.5 (*)    Neutro Abs 17.2 (*)    Monocytes Absolute 2.4 (*)    All other components within normal limits  CULTURE, BLOOD (ROUTINE X 2)  CULTURE, BLOOD (ROUTINE X 2)  BRAIN NATRIURETIC PEPTIDE  URINE RAPID DRUG SCREEN, HOSP PERFORMED  I-STAT TROPOININ, ED    Imaging Review Dg Chest 2 View  12/31/2015  CLINICAL DATA:  Cough for 3 weeks, worsening shortness of breath. EXAM: CHEST  2 VIEW COMPARISON:  None. FINDINGS: There is complete opacity of the right hemi thorax. There is suggestion of air-fluid level/ cavity in the right lung base. The left lung is clear. The evaluation of the mediastinum and heart size are limited due to with loss of mediastinal and cardiac border. The visualized skeletal structures are unremarkable. IMPRESSION:  Complete opacity of the right hemi thorax with suggestion of air-fluid level/ cavity in the right lung base, further evaluation with chest CT is recommended. Electronically Signed   By: Sherian Rein M.D.   On: 12/31/2015 20:21   Ct Chest W Contrast  12/31/2015  CLINICAL DATA:  Acute onset of cough and shortness of breath. Right-sided pleural effusion. Initial encounter. EXAM: CT CHEST WITH CONTRAST TECHNIQUE: Multidetector CT imaging of the chest was performed during intravenous contrast administration. CONTRAST:  OMNIPAQUE IOHEXOL 300 MG/ML  SOLN COMPARISON:  Chest radiograph performed earlier today at 7:55 p.m. FINDINGS: A large loculated right-sided pleural effusion is noted, with surrounding atelectasis. An empyema cannot be excluded. There is a large 6.9 x 6.7 x 4.4 cm complex collection of fluid and air at the  right middle lobe, with a prominent surrounding rind. This most likely reflects a lung abscess, though infection of an underlying necrotic mass cannot be excluded. The left lung is relatively clear.  No pneumothorax is seen. A subcarinal node measures up to 1.4 cm in short axis. No additional mediastinal lymphadenopathy is seen. No pericardial effusion is identified. The great vessels are grossly unremarkable in appearance. The visualized portions of thyroid gland are unremarkable. No axillary lymphadenopathy is seen. The visualized portions of the liver are grossly unremarkable. The spleen appears mildly enlarged. The visualized portions of the pancreas, adrenal glands, kidneys and gallbladder are grossly unremarkable. An 8 mm lymph node is noted adjacent to the distal esophagus. No acute osseous abnormalities are seen. There appears to be a chronic incompletely healed fracture at the upper body of the sternum, with associated sclerosis and mild underlying periosteal change. Underlying infection cannot be excluded. IMPRESSION: 1. Large 6.9 x 6.7 x 4.4 cm complex collection of fluid and air at  the right middle lobe, with prominent surrounding rind. This most likely reflects a lung abscess, though infection of an underlying necrotic mass cannot be excluded. Further evaluation is recommended as deemed clinically appropriate. 2. Large loculated right-sided pleural effusion, occupying much of the right lung. Cannot exclude empyema. Surrounding atelectasis noted. 3. Enlarged subcarinal node, measuring 1.4 cm in short axis. Unusual 8 mm node noted adjacent to the distal esophagus. This may simply reflect infection, though malignancy cannot be entirely excluded. 4. Mild splenomegaly. 5. Apparent chronic incompletely healed fracture at the upper body of the sternum, with associated sclerosis and mild underlying periosteal change. Underlying infection cannot be excluded. Electronically Signed   By: Roanna Raider M.D.   On: 12/31/2015 23:44   I have personally reviewed and evaluated these images and lab results as part of my medical decision-making.   EKG Interpretation   Date/Time:  Thursday December 31 2015 20:00:36 EST Ventricular Rate:  87 PR Interval:  104 QRS Duration: 83 QT Interval:  279 QTC Calculation: 335 R Axis:   86 Text Interpretation:  Sinus rhythm Short PR interval RSR' in V1 or V2,  right VCD or RVH Confirmed by KNOTT MD, DANIEL [phone]) on 12/31/2015 9:23:14  PM     Meds given in ED:  Medications  vancomycin (VANCOCIN) IVPB 1000 mg/200 mL premix (1,000 mg Intravenous New Bag/Given 12/31/15 2343)  piperacillin-tazobactam (ZOSYN) IVPB 3.375 g (3.375 g Intravenous New Bag/Given 12/31/15 2344)  vancomycin (VANCOCIN) IVPB 1000 mg/200 mL premix (not administered)  piperacillin-tazobactam (ZOSYN) IVPB 3.375 g (not administered)  0.9 %  sodium chloride infusion (not administered)  heparin injection 5,000 Units (not administered)  morphine 2 MG/ML injection 2-4 mg (not administered)  iohexol (OMNIPAQUE) 300 MG/ML solution 75 mL (100 mLs Intravenous Contrast Given 12/31/15 2212)     New Prescriptions   No medications on file   Filed Vitals:   12/31/15 2000 12/31/15 2100 12/31/15 2130 12/31/15 2341  BP: 123/81 113/78 97/68 119/76  Pulse: 85 86 88 86  Temp:      TempSrc:      Resp: 21 44 31 13  Height:      Weight:      SpO2: 93% 90% 95% 91%    MDM  [name] is a 40 y.o. male from [facility], comes in for evaluation of worsening shortness of breath, chest discomfort over the past 3 or 4 weeks. On arrival, patient is hemodynamically stable and afebrile. He does become slightly hypoxic  at 90%. He is somewhat somnolent and tired on exam and does not fully cooperate with the exam. On x-ray he is found to have a right pleural effusion. Basic labs show a white count of 21.5. Troponin is 0. Pending BNP and CT chest. Bedside ultrasound performed by Dr. Clydene Pugh, large effusion concerning for possible empyema. Initiated empiric antibiotics in the emergency department to cover for MRSA. Blood cultures obtained. Chest CT shows a large complex collection of fluid in right middle lobe likely reflecting a lung abscess. Large loculated right-sided pleural effusion occupying much of the right lung. Patient remains hemodynamically stable and afebrile. Discussed with medical service, Dr. Julian Reil to evaluate in the ED. Patient admitted. Prior to admission, I discussed and reviewed this case with my attending Dr. Clydene Pugh, who also saw and evaluated the patient and agrees with medical admission. Final diagnoses:  Pleural effusion on right        Joycie Peek, PA-C 01/01/16 0024  Lyndal Pulley, MD 01/01/16 [phone]

## 2015-12-31 NOTE — ED Notes (Signed)
Pt arrives from [facility] via [facility]. Pt states he has had a cough and nasal congestion x2 days. Pt also had c/o SOB PTA, pt took 2 puffs of his inhaler and reports sob resolved. Pt states he is having a productive cough with green mucous.

## 2016-01-01 ENCOUNTER — Encounter (HOSPITAL_COMMUNITY): Payer: Self-pay | Admitting: Thoracic Surgery (Cardiothoracic Vascular Surgery)

## 2016-01-01 DIAGNOSIS — J984 Other disorders of lung: Secondary | ICD-10-CM

## 2016-01-01 DIAGNOSIS — J852 Abscess of lung without pneumonia: Secondary | ICD-10-CM

## 2016-01-01 DIAGNOSIS — J869 Pyothorax without fistula: Secondary | ICD-10-CM

## 2016-01-01 LAB — BLOOD GAS, ARTERIAL
ACID-BASE EXCESS: 5.6 mmol/L — AB (ref 0.0–2.0)
Bicarbonate: 30.2 mEq/L — ABNORMAL HIGH (ref 20.0–24.0)
DRAWN BY: 365271
FIO2: 0.21
O2 Saturation: 89.3 %
PATIENT TEMPERATURE: 98.6
PCO2 ART: 49 mmHg — AB (ref 35.0–45.0)
TCO2: 31.7 mmol/L (ref 0–100)
pH, Arterial: 7.406 (ref 7.350–7.450)
pO2, Arterial: 59.8 mmHg — ABNORMAL LOW (ref 80.0–100.0)

## 2016-01-01 LAB — CBC
HCT: 35.7 % — ABNORMAL LOW (ref 39.0–52.0)
HEMOGLOBIN: 11.8 g/dL — AB (ref 13.0–17.0)
MCH: 29.1 pg (ref 26.0–34.0)
MCHC: 33.1 g/dL (ref 30.0–36.0)
MCV: 87.9 fL (ref 78.0–100.0)
PLATELETS: 241 10*3/uL (ref 150–400)
RBC: 4.06 MIL/uL — ABNORMAL LOW (ref 4.22–5.81)
RDW: 13.7 % (ref 11.5–15.5)
WBC: 21 10*3/uL — ABNORMAL HIGH (ref 4.0–10.5)

## 2016-01-01 LAB — APTT: aPTT: 40 seconds — ABNORMAL HIGH (ref 24–37)

## 2016-01-01 LAB — VANCOMYCIN, TROUGH: VANCOMYCIN TR: 86 ug/mL — AB (ref 10.0–20.0)

## 2016-01-01 LAB — PROTIME-INR
INR: 1.44 (ref 0.00–1.49)
PROTHROMBIN TIME: 17.6 s — AB (ref 11.6–15.2)

## 2016-01-01 LAB — ABO/RH: ABO/RH(D): AB POS

## 2016-01-01 MED ORDER — VANCOMYCIN HCL IN DEXTROSE 1-5 GM/200ML-% IV SOLN: 1000.0000 mg | INTRAVENOUS | Status: DC

## 2016-01-01 MED ORDER — MORPHINE SULFATE (PF) 2 MG/ML IV SOLN
2.0000 mg | INTRAVENOUS | Status: DC | PRN
  Administered 2016-01-01: 4 mg via INTRAVENOUS
  Filled 2016-01-01: qty 2

## 2016-01-01 MED ORDER — ENOXAPARIN SODIUM 40 MG/0.4ML ~~LOC~~ SOLN
40.0000 mg | SUBCUTANEOUS | Status: DC
  Administered 2016-01-01: 40 mg via SUBCUTANEOUS
  Filled 2016-01-01: qty 0.4

## 2016-01-01 MED ORDER — FENTANYL CITRATE (PF) 100 MCG/2ML IJ SOLN
25.0000 ug | INTRAMUSCULAR | Status: DC | PRN
  Administered 2016-01-01 – 2016-01-02 (×5): 50 ug via INTRAVENOUS
  Filled 2016-01-01 (×5): qty 2

## 2016-01-01 NOTE — Consult Note (Signed)
Reason for Consult:Right lung abscess,pleural effusion Referring Physician: Dr. Isa Rankin [name] is an 40 y.o. male.  HPI: 40 yo man with a past history significant for remote heavy ethanol abuse, tobacco abuse, hypertension, depression, anxiety, seizures and borderline personality disorder who presents a a 2 week history of right sided chest pain. He also complains of general malaise, some nonproductive cough and congestion. He denies fevers, chills or night sweats.   He came to the ED today. CXR showed white out of the right chest. A CT showed a large right pleural effusion. There is a likely right middle abscess. He complains of nausea secondary to morphine and requests a different pain med.  Past Medical History  Diagnosis Date  . Depression   . Anxiety   . Hypertension   . GERD (gastroesophageal reflux disease)   . Seizures [facility])     Past Surgical History  Procedure Laterality Date  . Finger surgery Left     5th digit    Family History  Problem Relation Age of Onset  . CAD Mother   . Diabetes Mellitus II Father     Social History:  reports that he has been smoking Cigarettes.  He has been smoking about 0.50 packs per day. He does not have any smokeless tobacco history on file. He reports that he does not drink alcohol or use illicit drugs.  Allergies: No Known Allergies  Medications:  Scheduled: . enoxaparin (LOVENOX) injection  40 mg Subcutaneous Q24H  . piperacillin-tazobactam (ZOSYN)  IV  3.375 g Intravenous Q8H  . vancomycin  1,000 mg Intravenous Q8H    Results for orders placed or performed during the hospital encounter of 12/31/15 (from the past 48 hour(s))  Basic metabolic panel     Status: Abnormal   Collection Time: 12/31/15  8:00 PM  Result Value Ref Range   Sodium 130 (L) 135 - 145 mmol/L   Potassium 4.5 3.5 - 5.1 mmol/L   Chloride 89 (L) 101 - 111 mmol/L   CO2 28 22 - 32 mmol/L   Glucose, Bld 112 (H) 65 - 99 mg/dL   BUN 21 (H) 6 - 20 mg/dL    Creatinine, Ser 0.88 0.61 - 1.24 mg/dL   Calcium 8.1 (L) 8.9 - 10.3 mg/dL   GFR calc non Af Amer >60 >60 mL/min   GFR calc Af Amer >60 >60 mL/min    Comment: (NOTE) The eGFR has been calculated using the CKD EPI equation. This calculation has not been validated in all clinical situations. eGFR's persistently <60 mL/min signify possible Chronic Kidney Disease.    Anion gap 13 5 - 15  CBC with Differential     Status: Abnormal   Collection Time: 12/31/15  8:00 PM  Result Value Ref Range   WBC 21.5 (H) 4.0 - 10.5 K/uL   RBC 4.19 (L) 4.22 - 5.81 MIL/uL   Hemoglobin 12.3 (L) 13.0 - 17.0 g/dL   HCT 36.5 (L) 39.0 - 52.0 %   MCV 87.1 78.0 - 100.0 fL   MCH 29.4 26.0 - 34.0 pg   MCHC 33.7 30.0 - 36.0 g/dL   RDW 13.2 11.5 - 15.5 %   Platelets 271 150 - 400 K/uL   Neutrophils Relative % 80 %   Neutro Abs 17.2 (H) 1.7 - 7.7 K/uL   Lymphocytes Relative 9 %   Lymphs Abs 1.9 0.7 - 4.0 K/uL   Monocytes Relative 11 %   Monocytes Absolute 2.4 (H) 0.1 - 1.0 K/uL  Eosinophils Relative 0 %   Eosinophils Absolute 0.0 0.0 - 0.7 K/uL   Basophils Relative 0 %   Basophils Absolute 0.0 0.0 - 0.1 K/uL  Brain natriuretic peptide     Status: None   Collection Time: 12/31/15  8:00 PM  Result Value Ref Range   B Natriuretic Peptide 62.6 0.0 - 100.0 pg/mL  I-Stat Troponin, ED (not at [facility])     Status: None   Collection Time: 12/31/15  8:38 PM  Result Value Ref Range   Troponin i, poc 0.00 0.00 - 0.08 ng/mL   Comment 3            Comment: Due to the release kinetics of cTnI, a negative result within the first hours of the onset of symptoms does not rule out myocardial infarction with certainty. If myocardial infarction is still suspected, repeat the test at appropriate intervals.   Culture, blood (routine x 2)     Status: None (Preliminary result)   Collection Time: 12/31/15  9:56 PM  Result Value Ref Range   Specimen Description BLOOD LEFT ARM    Special Requests AEROBIC BOTTLE ONLY 5ML     Culture NO GROWTH < 24 HOURS    Report Status PENDING   Culture, blood (routine x 2)     Status: None (Preliminary result)   Collection Time: 12/31/15 10:01 PM  Result Value Ref Range   Specimen Description BLOOD LEFT HAND    Special Requests AEROBIC BOTTLE ONLY 5ML    Culture NO GROWTH < 24 HOURS    Report Status PENDING     Dg Chest 2 View  12/31/2015  CLINICAL DATA:  Cough for 3 weeks, worsening shortness of breath. EXAM: CHEST  2 VIEW COMPARISON:  None. FINDINGS: There is complete opacity of the right hemi thorax. There is suggestion of air-fluid level/ cavity in the right lung base. The left lung is clear. The evaluation of the mediastinum and heart size are limited due to with loss of mediastinal and cardiac border. The visualized skeletal structures are unremarkable. IMPRESSION: Complete opacity of the right hemi thorax with suggestion of air-fluid level/ cavity in the right lung base, further evaluation with chest CT is recommended. Electronically Signed   By: Abelardo Diesel M.D.   On: 12/31/2015 20:21   Ct Chest W Contrast  12/31/2015  CLINICAL DATA:  Acute onset of cough and shortness of breath. Right-sided pleural effusion. Initial encounter. EXAM: CT CHEST WITH CONTRAST TECHNIQUE: Multidetector CT imaging of the chest was performed during intravenous contrast administration. CONTRAST:  142m OMNIPAQUE IOHEXOL 300 MG/ML  SOLN COMPARISON:  Chest radiograph performed earlier today at 7:55 p.m. FINDINGS: A large loculated right-sided pleural effusion is noted, with surrounding atelectasis. An empyema cannot be excluded. There is a large 6.9 x 6.7 x 4.4 cm complex collection of fluid and air at the right middle lobe, with a prominent surrounding rind. This most likely reflects a lung abscess, though infection of an underlying necrotic mass cannot be excluded. The left lung is relatively clear.  No pneumothorax is seen. A subcarinal node measures up to 1.4 cm in short axis. No additional  mediastinal lymphadenopathy is seen. No pericardial effusion is identified. The great vessels are grossly unremarkable in appearance. The visualized portions of thyroid gland are unremarkable. No axillary lymphadenopathy is seen. The visualized portions of the liver are grossly unremarkable. The spleen appears mildly enlarged. The visualized portions of the pancreas, adrenal glands, kidneys and gallbladder are grossly unremarkable. An 8 mm  lymph node is noted adjacent to the distal esophagus. No acute osseous abnormalities are seen. There appears to be a chronic incompletely healed fracture at the upper body of the sternum, with associated sclerosis and mild underlying periosteal change. Underlying infection cannot be excluded. IMPRESSION: 1. Large 6.9 x 6.7 x 4.4 cm complex collection of fluid and air at the right middle lobe, with prominent surrounding rind. This most likely reflects a lung abscess, though infection of an underlying necrotic mass cannot be excluded. Further evaluation is recommended as deemed clinically appropriate. 2. Large loculated right-sided pleural effusion, occupying much of the right lung. Cannot exclude empyema. Surrounding atelectasis noted. 3. Enlarged subcarinal node, measuring 1.4 cm in short axis. Unusual 8 mm node noted adjacent to the distal esophagus. This may simply reflect infection, though malignancy cannot be entirely excluded. 4. Mild splenomegaly. 5. Apparent chronic incompletely healed fracture at the upper body of the sternum, with associated sclerosis and mild underlying periosteal change. Underlying infection cannot be excluded. Electronically Signed   By: Garald Balding M.D.   On: 12/31/2015 23:44    Review of Systems  Constitutional: Positive for malaise/fatigue. Negative for fever and chills.  HENT: Positive for congestion. Negative for sore throat.   Eyes: Negative for blurred vision and double vision.  Respiratory: Positive for cough, shortness of breath and  wheezing. Negative for sputum production and stridor.   Cardiovascular: Positive for chest pain (right sided pleuritic). Negative for orthopnea.  Gastrointestinal: Negative for heartburn, nausea and vomiting.  Genitourinary: Negative for dysuria and hematuria.  Musculoskeletal: Positive for back pain.       Chronic pain  Neurological: Positive for seizures. Negative for dizziness, speech change and focal weakness.  Psychiatric/Behavioral: Positive for depression. The patient is nervous/anxious.   All other systems reviewed and are negative.  Blood pressure 102/77, pulse 79, temperature 99.1 F (37.3 C), temperature source Oral, resp. rate 21, height _0  (1.803 m), weight 211 lb (95.709 kg), SpO2 95 %. Physical Exam  Vitals reviewed. Constitutional: He is oriented to person, place, and time. He appears well-developed and well-nourished. No distress.  HENT:  Head: Normocephalic and atraumatic.  Eyes: Conjunctivae and EOM are normal. No scleral icterus.  Neck: Neck supple. No thyromegaly present.  Cardiovascular: Normal rate, regular rhythm, normal heart sounds and intact distal pulses.  Exam reveals no gallop and no friction rub.   No murmur heard. Respiratory: Effort normal.  Absent BS right posteriorly, bronchophony anteriorly  GI: Soft. He exhibits no distension. There is no tenderness.  Musculoskeletal: Normal range of motion. He exhibits no edema.  Lymphadenopathy:    He has no cervical adenopathy.  Neurological: He is alert and oriented to person, place, and time. No cranial nerve deficit.  Motor grossly intact  Skin: Skin is warm and dry.    Assessment/Plan: 40 yo man with a right middle lobe abscess and large right pleural effusion c/w an empyema. The abscess itself can be treated with antibiotics but the effusion requires drainage. Given the appearance on CT and the time course of his illness I think the best option is to do a right VATS to drain the empyema and decorticate  the right lung.   I discussed  the general nature of the procedure, the need for general anesthesia, the incisions to be used, and the need for chest tube drainage postop with Mr. Slagter. I informed him of the expected hospital stay, overall recovery and short and long term outcomes. I reviewed the indications, risks,  benefits and alternatives with him. He understands the risks include, but are not limited to death, stroke, MI, DVT/PE, bleeding, possible need for transfusion, infections, prolonged air leaks, as well as other organ system dysfunction including renal, or GI complications. He accepts the risks and agrees to proceed.  Plan OR tomorrow AM depending on availability  Melrose Nakayama 01/01/2016, 2:49 PM

## 2016-01-01 NOTE — Progress Notes (Signed)
Patient seen and examined , very drowsy but oriented x3, c/o right sided chest pain, no cough, no respiratory  noticed during encounter, currently on 2 liter oxygen. Lung right side diminished. No edema, lower extremity weakness, reported has been weak for a long time.  Patient is from assisted living, with past history of alcohol and psychiatric disorder, past h/o ? Seizure vs pseudoseizure, aspiration pna, admitted due to sob, possible right lung abscess, admitting provided curbsided pccm who recommended ct surgery consult, CT surgery called and will see patient today.  continue abx,  Poor iv access, picc line ordered.

## 2016-01-01 NOTE — H&P (Signed)
[facility] History and Physical  [name] ZOX:[id number] DOB: 1976-05-27 DOA: 12/31/2015  Referring physician: EDP PCP: Pcp Not In System   Chief Complaint: SOB   HPI: [name] is a 40 y.o. male who presents to the ED with 2 week history of progressive SOB.  He also has nasal cough / congestion, and R sided chest pain.  Symptoms have been getting progressively worse since onset, nothing makes symptoms better or worse, has tried nothing for symptoms.  Please see CT of chest.  Review of Systems: Systems reviewed.  As above, otherwise negative  Past Medical History  Diagnosis Date  . Depression   . Anxiety   . Hypertension   . GERD (gastroesophageal reflux disease)   . Seizures [facility])    Past Surgical History  Procedure Laterality Date  . Finger surgery Left     5th digit   Social History:  reports that he has been smoking Cigarettes.  He has been smoking about 0.50 packs per day. He does not have any smokeless tobacco history on file. He reports that he does not drink alcohol or use illicit drugs.  No Known Allergies  Family History  Problem Relation Age of Onset  . CAD Mother   . Diabetes Mellitus II Father      Prior to Admission medications   Medication Sig Start Date End Date Taking? Authorizing Provider  amLODipine (NORVASC) 2.5 MG tablet Take 2.5 mg by mouth daily.   Yes Historical Provider, MD  buPROPion (WELLBUTRIN XL) 300 MG 24 hr tablet Take 300 mg by mouth daily.   Yes Historical Provider, MD  chlordiazePOXIDE (LIBRIUM) 25 MG capsule Take 1 capsule (25 mg total) by mouth 4 (four) times daily -  with meals and at bedtime. 11/13/15  Yes Jimmy Footman, MD  clonazePAM (KLONOPIN) 1 MG tablet Take 1-2 mg by mouth 3 (three) times daily. Take 1 mg every morning  Take 2 mg every evening and at bedtime   Yes Historical Provider, MD  divalproex (DEPAKOTE ER) 500 MG 24 hr tablet Take 4 tablets (2,000 mg total) by mouth at bedtime.  11/13/15  Yes Jimmy Footman, MD  escitalopram (LEXAPRO) 20 MG tablet Take 20 mg by mouth every morning.   Yes Historical Provider, MD  esomeprazole (NEXIUM) 40 MG capsule Take 40 mg by mouth daily.    Yes Historical Provider, MD  Fluticasone-Salmeterol (ADVAIR) 250-50 MCG/DOSE AEPB Inhale 1 puff into the lungs 2 (two) times daily.   Yes Historical Provider, MD  hydrOXYzine (ATARAX/VISTARIL) 50 MG tablet Take 50 mg by mouth 3 (three) times daily as needed for anxiety.   Yes Historical Provider, MD  Melatonin 5 MG TABS Take 10 mg by mouth at bedtime.   Yes Historical Provider, MD  meloxicam (MOBIC) 15 MG tablet Take 15 mg by mouth daily.   Yes Historical Provider, MD  Multiple Vitamins-Minerals (THEREMS-M) TABS Take 1 tablet by mouth daily.   Yes Historical Provider, MD  traZODone (DESYREL) 100 MG tablet Take 300 mg by mouth at bedtime.   Yes Historical Provider, MD   Physical Exam: Filed Vitals:   12/31/15 2130 12/31/15 2341  BP: 97/68 119/76  Pulse: 88 86  Temp:    Resp: 31 13    BP 119/76 mmHg  Pulse 86  Temp(Src) 98.1 F (36.7 C) (Oral)  Resp 13  Ht 5\' 11"  (1.803 m)  Wt 95.709 kg (211 lb)  BMI 29.44 kg/m2  SpO2 91%  General Appearance:  Alert, oriented, no distress, appears stated age  Head:    Normocephalic, atraumatic  Eyes:    PERRL, EOMI, sclera non-icteric        Nose:   Nares without drainage or epistaxis. Mucosa, turbinates normal  Throat:   Moist mucous membranes. Oropharynx without erythema or exudate.  Neck:   Supple. No carotid bruits.  No thyromegaly.  No lymphadenopathy.   Back:     No CVA tenderness, no spinal tenderness  Lungs:     Clear to auscultation bilaterally, without wheezes, rhonchi or rales  Chest wall:    No tenderness to palpitation  Heart:    Regular rate and rhythm without murmurs, gallops, rubs  Abdomen:     Soft, non-tender, nondistended, normal bowel sounds, no organomegaly  Genitalia:    deferred  Rectal:    deferred   Extremities:   No clubbing, cyanosis or edema.  Pulses:   2+ and symmetric all extremities  Skin:   Skin color, texture, turgor normal, no rashes or lesions  Lymph nodes:   Cervical, supraclavicular, and axillary nodes normal  Neurologic:   CNII-XII intact. Normal strength, sensation and reflexes      throughout    Labs on Admission:  Basic Metabolic Panel:  Recent Labs Lab 12/31/15 2000  NA 130*  K 4.5  CL 89*  CO2 28  GLUCOSE 112*  BUN 21*  CREATININE 0.88  CALCIUM 8.1*   Liver Function Tests: No results for input(s): AST, ALT, ALKPHOS, BILITOT, PROT, ALBUMIN in the last 168 hours. No results for input(s): LIPASE, AMYLASE in the last 168 hours. No results for input(s): AMMONIA in the last 168 hours. CBC:  Recent Labs Lab 12/31/15 2000  WBC 21.5*  NEUTROABS 17.2*  HGB 12.3*  HCT 36.5*  MCV 87.1  PLT 271   Cardiac Enzymes: No results for input(s): CKTOTAL, CKMB, CKMBINDEX, TROPONINI in the last 168 hours.  BNP (last 3 results) No results for input(s): PROBNP in the last 8760 hours. CBG: No results for input(s): GLUCAP in the last 168 hours.  Radiological Exams on Admission: Dg Chest 2 View  12/31/2015  CLINICAL DATA:  Cough for 3 weeks, worsening shortness of breath. EXAM: CHEST  2 VIEW COMPARISON:  None. FINDINGS: There is complete opacity of the right hemi thorax. There is suggestion of air-fluid level/ cavity in the right lung base. The left lung is clear. The evaluation of the mediastinum and heart size are limited due to with loss of mediastinal and cardiac border. The visualized skeletal structures are unremarkable. IMPRESSION: Complete opacity of the right hemi thorax with suggestion of air-fluid level/ cavity in the right lung base, further evaluation with chest CT is recommended. Electronically Signed   By: Sherian Rein M.D.   On: 12/31/2015 20:21   Ct Chest W Contrast  12/31/2015  CLINICAL DATA:  Acute onset of cough and shortness of breath. Right-sided  pleural effusion. Initial encounter. EXAM: CT CHEST WITH CONTRAST TECHNIQUE: Multidetector CT imaging of the chest was performed during intravenous contrast administration. CONTRAST:  OMNIPAQUE IOHEXOL 300 MG/ML  SOLN COMPARISON:  Chest radiograph performed earlier today at 7:55 p.m. FINDINGS: A large loculated right-sided pleural effusion is noted, with surrounding atelectasis. An empyema cannot be excluded. There is a large 6.9 x 6.7 x 4.4 cm complex collection of fluid and air at the right middle lobe, with a prominent surrounding rind. This most likely reflects a lung abscess, though infection of an underlying necrotic mass cannot be excluded. The  left lung is relatively clear.  No pneumothorax is seen. A subcarinal node measures up to 1.4 cm in short axis. No additional mediastinal lymphadenopathy is seen. No pericardial effusion is identified. The great vessels are grossly unremarkable in appearance. The visualized portions of thyroid gland are unremarkable. No axillary lymphadenopathy is seen. The visualized portions of the liver are grossly unremarkable. The spleen appears mildly enlarged. The visualized portions of the pancreas, adrenal glands, kidneys and gallbladder are grossly unremarkable. An 8 mm lymph node is noted adjacent to the distal esophagus. No acute osseous abnormalities are seen. There appears to be a chronic incompletely healed fracture at the upper body of the sternum, with associated sclerosis and mild underlying periosteal change. Underlying infection cannot be excluded. IMPRESSION: 1. Large 6.9 x 6.7 x 4.4 cm complex collection of fluid and air at the right middle lobe, with prominent surrounding rind. This most likely reflects a lung abscess, though infection of an underlying necrotic mass cannot be excluded. Further evaluation is recommended as deemed clinically appropriate. 2. Large loculated right-sided pleural effusion, occupying much of the right lung. Cannot exclude empyema.  Surrounding atelectasis noted. 3. Enlarged subcarinal node, measuring 1.4 cm in short axis. Unusual 8 mm node noted adjacent to the distal esophagus. This may simply reflect infection, though malignancy cannot be entirely excluded. 4. Mild splenomegaly. 5. Apparent chronic incompletely healed fracture at the upper body of the sternum, with associated sclerosis and mild underlying periosteal change. Underlying infection cannot be excluded. Electronically Signed   By: Roanna Raider M.D.   On: 12/31/2015 23:44    EKG: Independently reviewed.  Assessment/Plan Principal Problem:   Pleural effusion, right Active Problems:   Cavitating mass in right middle lung lobe   1. Right middle lobe abscess vs neoplasm and R pleural effusion (parapneumonic vs empyema vs malignant) 1. Will empirically leave patient on zosyn and vanc for now 1. Does have WBC of 21k but no other SIRS at this time 2. BCx pending 2. Curb sided Dr. Tyson Alias in the ED who agreed that this probably should just be a CTS consult in the AM for probable VATS procedure 3. Plan to send fluid / tissue samples and further work up / treatment based on findings from VATS (or thoracentesis if CTS dosent want to go streight to VATS).    Code Status: Full  Family Communication: No family in room Disposition Plan: Admit to inpatient   Time spent: 70 min  GARDNER, JARED M. [facility] Pager [phone]  If 7AM-7PM, please contact the day team taking care of the patient [email or web address] Password [email or web address] 01/01/2016, 12:00 AM

## 2016-01-01 NOTE — Care Management Note (Addendum)
Case Management Note Donn Pierini RN, BSN Unit 2W-Case Manager [phone]  Patient Details  Name: Kentrail Shew MRN: [id number] Date of Birth: Dec 19, 1975  Subjective/Objective:     Pt admitted with Right middle lobe abscess vs neoplasm and R pleural effusion (parapneumonic vs empyema vs malignant) awaiting work up               Action/Plan: PTA pt lived at [facility]- NCM to follow for potential d/c needs, CSW consulted for return to [facility] when medically stable  Expected Discharge Date:                  Expected Discharge Plan:    In-House Referral:     Discharge planning Services  CM Consult  Post Acute Care Choice:    Choice offered to:     DME Arranged:    DME Agency:     HH Arranged:    HH Agency:     Status of Service:  In process, will continue to follow  Medicare Important Message Given:    Date Medicare IM Given:    Medicare IM give by:    Date Additional Medicare IM Given:    Additional Medicare Important Message give by:     If discussed at Long Length of Stay Meetings, dates discussed:    Additional Comments:  Darrold Span, RN 01/01/2016, 2:44 PM

## 2016-01-01 NOTE — Progress Notes (Signed)
Utilization review completed.  

## 2016-01-02 ENCOUNTER — Inpatient Hospital Stay (HOSPITAL_COMMUNITY): Payer: Medicare Other | Admitting: Certified Registered Nurse Anesthetist

## 2016-01-02 ENCOUNTER — Encounter (HOSPITAL_COMMUNITY): Payer: Self-pay | Admitting: Anesthesiology

## 2016-01-02 ENCOUNTER — Inpatient Hospital Stay (HOSPITAL_COMMUNITY): Payer: Medicare Other

## 2016-01-02 ENCOUNTER — Encounter (HOSPITAL_COMMUNITY)
Admission: EM | Disposition: A | Discharge status: Home Health | Payer: Self-pay | Source: Home / Self Care | Attending: Internal Medicine

## 2016-01-02 HISTORY — PX: VIDEO ASSISTED THORACOSCOPY (VATS)/DECORTICATION: SHX6171

## 2016-01-02 HISTORY — PX: PLEURAL EFFUSION DRAINAGE: SHX5099

## 2016-01-02 HISTORY — PX: VIDEO BRONCHOSCOPY: SHX5072

## 2016-01-02 LAB — URINALYSIS, ROUTINE W REFLEX MICROSCOPIC
Glucose, UA: NEGATIVE mg/dL
Hgb urine dipstick: NEGATIVE
KETONES UR: NEGATIVE mg/dL
NITRITE: NEGATIVE
PH: 6 (ref 5.0–8.0)
PROTEIN: NEGATIVE mg/dL
Specific Gravity, Urine: 1.026 (ref 1.005–1.030)

## 2016-01-02 LAB — BASIC METABOLIC PANEL
Anion gap: 11 (ref 5–15)
BUN: 13 mg/dL (ref 6–20)
CHLORIDE: 92 mmol/L — AB (ref 101–111)
CO2: 29 mmol/L (ref 22–32)
Calcium: 7.7 mg/dL — ABNORMAL LOW (ref 8.9–10.3)
Creatinine, Ser: 0.71 mg/dL (ref 0.61–1.24)
GFR calc Af Amer: >60 mL/min (ref >60)
GLUCOSE: 122 mg/dL — AB (ref 65–99)
POTASSIUM: 4 mmol/L (ref 3.5–5.1)
Sodium: 132 mmol/L — ABNORMAL LOW (ref 135–145)

## 2016-01-02 LAB — SURGICAL PCR SCREEN
MRSA, PCR: NEGATIVE
STAPHYLOCOCCUS AUREUS: NEGATIVE

## 2016-01-02 LAB — CBC
HCT: 33.9 % — ABNORMAL LOW (ref 39.0–52.0)
HEMOGLOBIN: 11.1 g/dL — AB (ref 13.0–17.0)
MCH: 29 pg (ref 26.0–34.0)
MCHC: 32.7 g/dL (ref 30.0–36.0)
MCV: 88.5 fL (ref 78.0–100.0)
Platelets: 250 10*3/uL (ref 150–400)
RBC: 3.83 MIL/uL — AB (ref 4.22–5.81)
RDW: 13.8 % (ref 11.5–15.5)
WBC: 20.8 10*3/uL — ABNORMAL HIGH (ref 4.0–10.5)

## 2016-01-02 LAB — GRAM STAIN

## 2016-01-02 LAB — RAPID URINE DRUG SCREEN, HOSP PERFORMED
Amphetamines: NOT DETECTED
BENZODIAZEPINES: POSITIVE — AB
Barbiturates: NOT DETECTED
COCAINE: NOT DETECTED
OPIATES: POSITIVE — AB
Tetrahydrocannabinol: NOT DETECTED

## 2016-01-02 LAB — URINE MICROSCOPIC-ADD ON

## 2016-01-02 LAB — MAGNESIUM: Magnesium: 2.3 mg/dL (ref 1.7–2.4)

## 2016-01-02 SURGERY — BRONCHOSCOPY, VIDEO-ASSISTED
Anesthesia: General | Site: Chest | Laterality: Right

## 2016-01-02 MED ORDER — ACETAMINOPHEN 160 MG/5ML PO SOLN: 1000.0000 mg | Freq: Four times a day (QID) | ORAL | Status: AC

## 2016-01-02 MED ORDER — SUGAMMADEX SODIUM 200 MG/2ML IV SOLN
INTRAVENOUS | Status: AC
  Filled 2016-01-02: qty 2

## 2016-01-02 MED ORDER — NALOXONE HCL 0.4 MG/ML IJ SOLN: 0.4000 mg | INTRAMUSCULAR | Status: DC | PRN

## 2016-01-02 MED ORDER — KCL IN DEXTROSE-NACL 20-5-0.9 MEQ/L-%-% IV SOLN
INTRAVENOUS | Status: DC
  Filled 2016-01-02 (×2): qty 1000

## 2016-01-02 MED ORDER — 0.9 % SODIUM CHLORIDE (POUR BTL) OPTIME
TOPICAL | Status: DC | PRN
  Administered 2016-01-02: 1000 mL

## 2016-01-02 MED ORDER — FENTANYL 40 MCG/ML IV SOLN
INTRAVENOUS | Status: DC
  Administered 2016-01-02: 14:00:00 via INTRAVENOUS
  Administered 2016-01-02: 20 ug via INTRAVENOUS
  Administered 2016-01-03: 60 ug via INTRAVENOUS
  Administered 2016-01-03 (×2): 50 ug via INTRAVENOUS
  Administered 2016-01-03: 120 ug via INTRAVENOUS
  Administered 2016-01-03: 1 ug via INTRAVENOUS
  Administered 2016-01-03: 70 ug via INTRAVENOUS
  Administered 2016-01-04: 20 ug via INTRAVENOUS
  Administered 2016-01-04: 10 ug via INTRAVENOUS
  Administered 2016-01-04: 0.01 ug via INTRAVENOUS
  Administered 2016-01-04: 10 ug via INTRAVENOUS
  Administered 2016-01-04: 20 ug via INTRAVENOUS
  Administered 2016-01-04: 30 ug via INTRAVENOUS
  Administered 2016-01-05 (×2): 10 ug via INTRAVENOUS
  Administered 2016-01-05: 20 ug via INTRAVENOUS
  Administered 2016-01-06 (×2): 30 ug via INTRAVENOUS
  Administered 2016-01-06: 10 ug via INTRAVENOUS
  Administered 2016-01-06: 50 ug via INTRAVENOUS
  Administered 2016-01-06: 60 ug via INTRAVENOUS
  Administered 2016-01-06: 30 ug via INTRAVENOUS
  Administered 2016-01-07: 60 ug via INTRAVENOUS
  Administered 2016-01-07: 40 ug via INTRAVENOUS
  Administered 2016-01-07: 30 ug via INTRAVENOUS
  Administered 2016-01-07: 20 ug via INTRAVENOUS
  Administered 2016-01-07: 30 ug via INTRAVENOUS
  Administered 2016-01-07: 50 ug via INTRAVENOUS
  Administered 2016-01-08: 10 ug via INTRAVENOUS
  Administered 2016-01-08: 70 ug via INTRAVENOUS
  Administered 2016-01-08: 40 ug via INTRAVENOUS
  Administered 2016-01-08: 30 ug via INTRAVENOUS
  Administered 2016-01-08: 40 ug via INTRAVENOUS
  Filled 2016-01-02: qty 25

## 2016-01-02 MED ORDER — SUCCINYLCHOLINE CHLORIDE 20 MG/ML IJ SOLN
INTRAMUSCULAR | Status: DC | PRN
  Administered 2016-01-02: 100 mg via INTRAVENOUS

## 2016-01-02 MED ORDER — AMLODIPINE BESYLATE 2.5 MG PO TABS
2.5000 mg | ORAL_TABLET | Freq: Every day | ORAL | Status: DC
Start: 2016-01-03 — End: 2016-01-03

## 2016-01-02 MED ORDER — INSULIN ASPART 100 UNIT/ML ~~LOC~~ SOLN
0.0000 [IU] | Freq: Four times a day (QID) | SUBCUTANEOUS | Status: DC
  Administered 2016-01-02: 2 [IU] via SUBCUTANEOUS

## 2016-01-02 MED ORDER — SENNOSIDES-DOCUSATE SODIUM 8.6-50 MG PO TABS
1.0000 | ORAL_TABLET | Freq: Every day | ORAL | Status: DC
  Administered 2016-01-02 – 2016-01-04 (×3): 1 via ORAL
  Filled 2016-01-02 (×3): qty 1

## 2016-01-02 MED ORDER — DIPHENHYDRAMINE HCL 12.5 MG/5ML PO ELIX: 12.5000 mg | ORAL_SOLUTION | Freq: Four times a day (QID) | ORAL | Status: DC | PRN

## 2016-01-02 MED ORDER — DIPHENHYDRAMINE HCL 50 MG/ML IJ SOLN: 12.5000 mg | Freq: Four times a day (QID) | INTRAMUSCULAR | Status: DC | PRN

## 2016-01-02 MED ORDER — ROCURONIUM BROMIDE 50 MG/5ML IV SOLN
INTRAVENOUS | Status: AC
  Filled 2016-01-02: qty 1

## 2016-01-02 MED ORDER — ROCURONIUM BROMIDE 100 MG/10ML IV SOLN
INTRAVENOUS | Status: DC | PRN
  Administered 2016-01-02: 30 mg via INTRAVENOUS
  Administered 2016-01-02: 20 mg via INTRAVENOUS
  Administered 2016-01-02: 15 mg via INTRAVENOUS
  Administered 2016-01-02: 20 mg via INTRAVENOUS
  Administered 2016-01-02: 35 mg via INTRAVENOUS

## 2016-01-02 MED ORDER — DIVALPROEX SODIUM ER 500 MG PO TB24
2000.0000 mg | ORAL_TABLET | Freq: Every day | ORAL | Status: DC
  Administered 2016-01-02 – 2016-01-10 (×9): 2000 mg via ORAL
  Filled 2016-01-02 (×13): qty 4

## 2016-01-02 MED ORDER — MIDAZOLAM HCL 2 MG/2ML IJ SOLN
INTRAMUSCULAR | Status: AC
  Filled 2016-01-02: qty 2

## 2016-01-02 MED ORDER — SODIUM CHLORIDE 0.9 % IV SOLN: INTRAVENOUS | Status: DC

## 2016-01-02 MED ORDER — TRAMADOL HCL 50 MG PO TABS
50.0000 mg | ORAL_TABLET | Freq: Four times a day (QID) | ORAL | Status: DC | PRN
  Administered 2016-01-04 (×2): 100 mg via ORAL
  Filled 2016-01-02 (×2): qty 2

## 2016-01-02 MED ORDER — ONDANSETRON HCL 4 MG/2ML IJ SOLN
INTRAMUSCULAR | Status: AC
  Filled 2016-01-02: qty 2

## 2016-01-02 MED ORDER — ADULT MULTIVITAMIN W/MINERALS CH
1.0000 | ORAL_TABLET | Freq: Every day | ORAL | Status: DC
  Administered 2016-01-02 – 2016-01-11 (×10): 1 via ORAL
  Filled 2016-01-02 (×10): qty 1

## 2016-01-02 MED ORDER — EPINEPHRINE HCL 1 MG/ML IJ SOLN
INTRAMUSCULAR | Status: AC
  Filled 2016-01-02: qty 1

## 2016-01-02 MED ORDER — ONDANSETRON HCL 4 MG/2ML IJ SOLN
4.0000 mg | Freq: Four times a day (QID) | INTRAMUSCULAR | Status: DC | PRN
  Administered 2016-01-05: 4 mg via INTRAVENOUS
  Filled 2016-01-02: qty 2

## 2016-01-02 MED ORDER — FENTANYL CITRATE (PF) 100 MCG/2ML IJ SOLN
INTRAMUSCULAR | Status: DC | PRN
  Administered 2016-01-02: 100 ug via INTRAVENOUS
  Administered 2016-01-02 (×2): 50 ug via INTRAVENOUS

## 2016-01-02 MED ORDER — BISACODYL 5 MG PO TBEC
10.0000 mg | DELAYED_RELEASE_TABLET | Freq: Every day | ORAL | Status: DC
  Administered 2016-01-02 – 2016-01-05 (×4): 10 mg via ORAL
  Filled 2016-01-02 (×4): qty 2

## 2016-01-02 MED ORDER — CLONAZEPAM 1 MG PO TABS
2.0000 mg | ORAL_TABLET | ORAL | Status: DC
  Administered 2016-01-02 – 2016-01-04 (×5): 2 mg via ORAL
  Filled 2016-01-02 (×5): qty 2

## 2016-01-02 MED ORDER — PANTOPRAZOLE SODIUM 40 MG PO TBEC
80.0000 mg | DELAYED_RELEASE_TABLET | Freq: Every day | ORAL | Status: DC
  Administered 2016-01-02 – 2016-01-04 (×3): 80 mg via ORAL
  Filled 2016-01-02 (×3): qty 2

## 2016-01-02 MED ORDER — SODIUM CHLORIDE 0.9 % IV SOLN
Freq: Once | INTRAVENOUS | Status: AC
  Administered 2016-01-02: 17:00:00 via INTRAVENOUS

## 2016-01-02 MED ORDER — OXYCODONE HCL 5 MG PO TABS
5.0000 mg | ORAL_TABLET | ORAL | Status: DC | PRN
  Administered 2016-01-03: 5 mg via ORAL
  Administered 2016-01-03 – 2016-01-04 (×3): 10 mg via ORAL
  Filled 2016-01-02 (×3): qty 2
  Filled 2016-01-02: qty 1

## 2016-01-02 MED ORDER — SUGAMMADEX SODIUM 200 MG/2ML IV SOLN
INTRAVENOUS | Status: DC | PRN
  Administered 2016-01-02: 200 mg via INTRAVENOUS

## 2016-01-02 MED ORDER — SODIUM CHLORIDE 0.9 % IV SOLN
10.0000 mg | INTRAVENOUS | Status: DC | PRN
  Administered 2016-01-02: 25 ug/min via INTRAVENOUS

## 2016-01-02 MED ORDER — POTASSIUM CHLORIDE 10 MEQ/50ML IV SOLN: 10.0000 meq | Freq: Every day | INTRAVENOUS | Status: DC | PRN

## 2016-01-02 MED ORDER — ONDANSETRON HCL 4 MG/2ML IJ SOLN: 4.0000 mg | Freq: Once | INTRAMUSCULAR | Status: DC | PRN

## 2016-01-02 MED ORDER — THEREMS-M PO TABS: 1.0000 | ORAL_TABLET | Freq: Every day | ORAL | Status: DC

## 2016-01-02 MED ORDER — OXYCODONE HCL 5 MG/5ML PO SOLN: 5.0000 mg | Freq: Once | ORAL | Status: DC | PRN

## 2016-01-02 MED ORDER — LACTATED RINGERS IV SOLN
INTRAVENOUS | Status: DC | PRN
  Administered 2016-01-02 (×2): via INTRAVENOUS

## 2016-01-02 MED ORDER — SODIUM CHLORIDE 0.9% FLUSH: 9.0000 mL | INTRAVENOUS | Status: DC | PRN

## 2016-01-02 MED ORDER — PROPOFOL 10 MG/ML IV BOLUS
INTRAVENOUS | Status: AC
  Filled 2016-01-02: qty 20

## 2016-01-02 MED ORDER — KCL IN DEXTROSE-NACL 20-5-0.9 MEQ/L-%-% IV SOLN
INTRAVENOUS | Status: DC
  Administered 2016-01-02: 18:00:00 via INTRAVENOUS
  Filled 2016-01-02 (×5): qty 1000

## 2016-01-02 MED ORDER — ACETAMINOPHEN 500 MG PO TABS
1000.0000 mg | ORAL_TABLET | Freq: Four times a day (QID) | ORAL | Status: AC
  Administered 2016-01-02 – 2016-01-07 (×19): 1000 mg via ORAL
  Filled 2016-01-02 (×18): qty 2

## 2016-01-02 MED ORDER — FENTANYL 10 MCG/ML IV SOLN
INTRAVENOUS | Status: DC
  Filled 2016-01-02: qty 50

## 2016-01-02 MED ORDER — OXYCODONE HCL 5 MG PO TABS: 5.0000 mg | ORAL_TABLET | Freq: Once | ORAL | Status: DC | PRN

## 2016-01-02 MED ORDER — HYDROMORPHONE HCL 1 MG/ML IJ SOLN
INTRAMUSCULAR | Status: AC
  Administered 2016-01-02: 0.5 mg via INTRAVENOUS
  Filled 2016-01-02: qty 1

## 2016-01-02 MED ORDER — MIDAZOLAM HCL 5 MG/5ML IJ SOLN
INTRAMUSCULAR | Status: DC | PRN
  Administered 2016-01-02 (×2): 1 mg via INTRAVENOUS

## 2016-01-02 MED ORDER — FENTANYL 40 MCG/ML IV SOLN
INTRAVENOUS | Status: AC
  Filled 2016-01-02: qty 25

## 2016-01-02 MED ORDER — HYDROMORPHONE HCL 1 MG/ML IJ SOLN
0.2500 mg | INTRAMUSCULAR | Status: DC | PRN
  Administered 2016-01-02 (×2): 0.5 mg via INTRAVENOUS

## 2016-01-02 MED ORDER — BUPROPION HCL ER (XL) 300 MG PO TB24
300.0000 mg | ORAL_TABLET | Freq: Every day | ORAL | Status: DC
  Administered 2016-01-03 – 2016-01-11 (×9): 300 mg via ORAL
  Filled 2016-01-02 (×5): qty 2
  Filled 2016-01-02: qty 1
  Filled 2016-01-02 (×2): qty 2
  Filled 2016-01-02: qty 1
  Filled 2016-01-02: qty 2

## 2016-01-02 MED ORDER — ONDANSETRON HCL 4 MG/2ML IJ SOLN: 4.0000 mg | Freq: Four times a day (QID) | INTRAMUSCULAR | Status: DC | PRN

## 2016-01-02 MED ORDER — MOMETASONE FURO-FORMOTEROL FUM 100-5 MCG/ACT IN AERO
2.0000 | INHALATION_SPRAY | Freq: Two times a day (BID) | RESPIRATORY_TRACT | Status: DC
  Administered 2016-01-04 – 2016-01-11 (×15): 2 via RESPIRATORY_TRACT
  Filled 2016-01-02 (×3): qty 8.8

## 2016-01-02 MED ORDER — FENTANYL CITRATE (PF) 250 MCG/5ML IJ SOLN
INTRAMUSCULAR | Status: AC
  Filled 2016-01-02: qty 5

## 2016-01-02 MED ORDER — ESCITALOPRAM OXALATE 20 MG PO TABS
20.0000 mg | ORAL_TABLET | Freq: Every day | ORAL | Status: DC
  Administered 2016-01-03 – 2016-01-11 (×9): 20 mg via ORAL
  Filled 2016-01-02 (×9): qty 1

## 2016-01-02 MED ORDER — CHLORDIAZEPOXIDE HCL 25 MG PO CAPS
25.0000 mg | ORAL_CAPSULE | Freq: Three times a day (TID) | ORAL | Status: DC
  Administered 2016-01-02 – 2016-01-05 (×11): 25 mg via ORAL
  Filled 2016-01-02 (×11): qty 1

## 2016-01-02 MED ORDER — LIDOCAINE HCL (CARDIAC) 20 MG/ML IV SOLN
INTRAVENOUS | Status: AC
  Filled 2016-01-02: qty 5

## 2016-01-02 MED ORDER — TRAZODONE HCL 150 MG PO TABS
300.0000 mg | ORAL_TABLET | Freq: Every day | ORAL | Status: DC
  Administered 2016-01-02 – 2016-01-04 (×3): 300 mg via ORAL
  Filled 2016-01-02 (×4): qty 2

## 2016-01-02 MED ORDER — PROPOFOL 10 MG/ML IV BOLUS
INTRAVENOUS | Status: DC | PRN
  Administered 2016-01-02: 200 mg via INTRAVENOUS

## 2016-01-02 MED ORDER — VANCOMYCIN HCL IN DEXTROSE 1-5 GM/200ML-% IV SOLN
1000.0000 mg | Freq: Three times a day (TID) | INTRAVENOUS | Status: DC
  Administered 2016-01-03 – 2016-01-04 (×4): 1000 mg via INTRAVENOUS
  Filled 2016-01-02 (×8): qty 200

## 2016-01-02 MED ORDER — CLONAZEPAM 1 MG PO TABS
1.0000 mg | ORAL_TABLET | Freq: Every day | ORAL | Status: DC
  Administered 2016-01-03 – 2016-01-04 (×2): 1 mg via ORAL
  Filled 2016-01-02 (×2): qty 1

## 2016-01-02 MED ORDER — VANCOMYCIN HCL IN DEXTROSE 1-5 GM/200ML-% IV SOLN
1000.0000 mg | Freq: Three times a day (TID) | INTRAVENOUS | Status: DC
  Filled 2016-01-02 (×3): qty 200

## 2016-01-02 MED ORDER — LIDOCAINE HCL (CARDIAC) 20 MG/ML IV SOLN
INTRAVENOUS | Status: DC | PRN
  Administered 2016-01-02: 60 mg via INTRAVENOUS

## 2016-01-02 SURGICAL SUPPLY — 84 items
APPLICATOR TIP EXT COSEAL (VASCULAR PRODUCTS) IMPLANT
BLOCK BITE 60FR ADLT L/F BLUE (MISCELLANEOUS) ×4 IMPLANT
BRUSH CYTOL CELLEBRITY 1.5X140 (MISCELLANEOUS) IMPLANT
CANISTER SUCTION 2500CC (MISCELLANEOUS) ×8 IMPLANT
CATH KIT ON Q 5IN SLV (PAIN MANAGEMENT) IMPLANT
CATH THORACIC 28FR (CATHETERS) IMPLANT
CATH THORACIC 28FR RT ANG (CATHETERS) IMPLANT
CATH THORACIC 36FR (CATHETERS) IMPLANT
CATH THORACIC 36FR RT ANG (CATHETERS) IMPLANT
CLEANER TIP ELECTROSURG 2X2 (MISCELLANEOUS) ×4 IMPLANT
CLIP TI MEDIUM 6 (CLIP) ×4 IMPLANT
CONN ST 1/4X3/8  BEN (MISCELLANEOUS)
CONN ST 1/4X3/8 BEN (MISCELLANEOUS) IMPLANT
CONN Y 3/8X3/8X3/8  BEN (MISCELLANEOUS)
CONN Y 3/8X3/8X3/8 BEN (MISCELLANEOUS) IMPLANT
CONT SPEC 4OZ CLIKSEAL STRL BL (MISCELLANEOUS) ×16 IMPLANT
CONT SPECI 4OZ STER CLIK (MISCELLANEOUS) ×8 IMPLANT
COTTONBALL LRG STERILE PKG (GAUZE/BANDAGES/DRESSINGS) IMPLANT
COVER SURGICAL LIGHT HANDLE (MISCELLANEOUS) ×4 IMPLANT
COVER TABLE BACK 60X90 (DRAPES) ×4 IMPLANT
DERMABOND ADVANCED (GAUZE/BANDAGES/DRESSINGS) ×1
DERMABOND ADVANCED .7 DNX12 (GAUZE/BANDAGES/DRESSINGS) ×3 IMPLANT
DRAPE LAPAROSCOPIC ABDOMINAL (DRAPES) ×4 IMPLANT
DRAPE WARM FLUID 44X44 (DRAPE) ×4 IMPLANT
ELECT BLADE 6.5 EXT (BLADE) ×4 IMPLANT
ELECT REM PT RETURN 9FT ADLT (ELECTROSURGICAL) ×4
ELECTRODE REM PT RTRN 9FT ADLT (ELECTROSURGICAL) ×3 IMPLANT
FILTER STRAW FLUID ASPIR (MISCELLANEOUS) IMPLANT
FORCEPS BIOP RJ4 1.8 (CUTTING FORCEPS) IMPLANT
GAUZE SPONGE 4X4 12PLY STRL (GAUZE/BANDAGES/DRESSINGS) ×4 IMPLANT
GLOVE SURG SIGNA 7.5 PF LTX (GLOVE) ×8 IMPLANT
GOWN STRL REUS W/ TWL LRG LVL3 (GOWN DISPOSABLE) ×6 IMPLANT
GOWN STRL REUS W/ TWL XL LVL3 (GOWN DISPOSABLE) ×3 IMPLANT
GOWN STRL REUS W/TWL LRG LVL3 (GOWN DISPOSABLE) ×2
GOWN STRL REUS W/TWL XL LVL3 (GOWN DISPOSABLE) ×1
HANDLE STAPLE ENDO GIA SHORT (STAPLE)
HEMOSTAT SURGICEL 2X14 (HEMOSTASIS) IMPLANT
KIT BASIN OR (CUSTOM PROCEDURE TRAY) ×4 IMPLANT
KIT CLEAN ENDO COMPLIANCE (KITS) ×4 IMPLANT
KIT ROOM TURNOVER OR (KITS) ×4 IMPLANT
NEEDLE 22X1 1/2 (OR ONLY) (NEEDLE) IMPLANT
NEEDLE BIOPSY TRANSBRONCH 21G (NEEDLE) IMPLANT
NS IRRIG 1000ML POUR BTL (IV SOLUTION) ×12 IMPLANT
PACK CHEST (CUSTOM PROCEDURE TRAY) ×4 IMPLANT
PAD ARMBOARD 7.5X6 YLW CONV (MISCELLANEOUS) ×8 IMPLANT
POUCH ENDO CATCH II 15MM (MISCELLANEOUS) IMPLANT
POUCH SPECIMEN RETRIEVAL 10MM (ENDOMECHANICALS) IMPLANT
SEALANT PROGEL (MISCELLANEOUS) IMPLANT
SEALANT SURG COSEAL 4ML (VASCULAR PRODUCTS) IMPLANT
SEALANT SURG COSEAL 8ML (VASCULAR PRODUCTS) IMPLANT
SOLUTION ANTI FOG 6CC (MISCELLANEOUS) ×4 IMPLANT
SPONGE GAUZE 4X4 12PLY STER LF (GAUZE/BANDAGES/DRESSINGS) ×4 IMPLANT
SPONGE INTESTINAL PEANUT (DISPOSABLE) ×24 IMPLANT
SPONGE TONSIL 1 RF SGL (DISPOSABLE) ×4 IMPLANT
STAPLER ENDO GIA 12MM SHORT (STAPLE) IMPLANT
SUT PROLENE 4 0 RB 1 (SUTURE)
SUT PROLENE 4-0 RB1 .5 CRCL 36 (SUTURE) IMPLANT
SUT SILK  1 MH (SUTURE) ×4
SUT SILK 1 MH (SUTURE) ×12 IMPLANT
SUT SILK 1 TIES 10X30 (SUTURE) ×4 IMPLANT
SUT SILK 2 0SH CR/8 30 (SUTURE) IMPLANT
SUT SILK 3 0SH CR/8 30 (SUTURE) IMPLANT
SUT VIC AB 1 CTX 36 (SUTURE) ×1
SUT VIC AB 1 CTX36XBRD ANBCTR (SUTURE) ×3 IMPLANT
SUT VIC AB 2-0 CTX 36 (SUTURE) ×4 IMPLANT
SUT VIC AB 3-0 MH 27 (SUTURE) IMPLANT
SUT VIC AB 3-0 X1 27 (SUTURE) ×4 IMPLANT
SUT VICRYL 2 TP 1 (SUTURE) IMPLANT
SYR 20ML ECCENTRIC (SYRINGE) ×8 IMPLANT
SYR 5ML LL (SYRINGE) IMPLANT
SYR 5ML LUER SLIP (SYRINGE) ×4 IMPLANT
SYR CONTROL 10ML LL (SYRINGE) IMPLANT
SYSTEM SAHARA CHEST DRAIN RE-I (WOUND CARE) ×4 IMPLANT
TAPE CLOTH 4X10 WHT NS (GAUZE/BANDAGES/DRESSINGS) ×4 IMPLANT
TAPE CLOTH SURG 4X10 WHT LF (GAUZE/BANDAGES/DRESSINGS) ×4 IMPLANT
TIP APPLICATOR SPRAY EXTEND 16 (VASCULAR PRODUCTS) IMPLANT
TOWEL OR 17X24 6PK STRL BLUE (TOWEL DISPOSABLE) ×8 IMPLANT
TOWEL OR 17X26 10 PK STRL BLUE (TOWEL DISPOSABLE) ×8 IMPLANT
TRAP SPECIMEN MUCOUS 40CC (MISCELLANEOUS) ×8 IMPLANT
TRAY FOLEY CATH 16FRSI W/METER (SET/KITS/TRAYS/PACK) ×4 IMPLANT
TROCAR XCEL BLADELESS 5X75MML (TROCAR) ×4 IMPLANT
TUBE CONNECTING 20X1/4 (TUBING) ×8 IMPLANT
TUNNELER SHEATH ON-Q 11GX8 DSP (PAIN MANAGEMENT) IMPLANT
WATER STERILE IRR 1000ML POUR (IV SOLUTION) ×4 IMPLANT

## 2016-01-02 NOTE — Progress Notes (Signed)
Patient is in the OR.

## 2016-01-02 NOTE — Interval H&P Note (Signed)
History and Physical Interval Note:  Patient is a ward of the state of [place] Informed consent obtained from Melynda Keller  01/02/2016 9:03 AM  Rosemary Holms  has presented today for surgery, with the diagnosis of RIGHT LUNG ABSCESS  RIGHT PLEURAL EFFUSION  The various methods of treatment have been discussed with the patient and family. After consideration of risks, benefits and other options for treatment, the patient has consented to  Procedure(s): VIDEO BRONCHOSCOPY (N/A) VIDEO ASSISTED THORACOSCOPY (VATS)/DECORTICATION (Right) DRAINAGE OF PLEURAL EFFUSION (Right) as a surgical intervention .  The patient's history has been reviewed, patient examined, no change in status, stable for surgery.  I have reviewed the patient's chart and labs.  Questions were answered to the patient's satisfaction.     Loreli Slot

## 2016-01-02 NOTE — Op Note (Signed)
[name], [name]                ACCOUNT NO.:  [id number]  MEDICAL RECORD NO.:  [id number]  LOCATION:  3S08C                        FACILITY:  [facility]  PHYSICIAN:  Salvatore Decent. Dorris Fetch, M.D.DATE OF BIRTH:  [birth year]  DATE OF PROCEDURE:  01/02/2016 DATE OF DISCHARGE:                              OPERATIVE REPORT   PREOPERATIVE DIAGNOSIS:  Right middle lobe abscess with right empyema.  POSTOPERATIVE DIAGNOSIS:  Right middle lobe abscess with right empyema.  PROCEDURES:  Bronchoscopy, right video-assisted thoracoscopy, drainage of empyema, visceral and parietal pleural decortication.  SURGEON:  Salvatore Decent. Dorris Fetch, M.D.  ASSISTANT:  Doree Fudge, PA.  ANESTHESIA:  General.  FINDINGS:  Bronchoscopy- normal endobronchial anatomy, no endobronchial lesions.  Purulent secretions from right middle lobe bronchus.  Right pleural space- early organizing empyema with multiloculated fluid collection, extensive fibrinous exudative peel on visceral and parietal pleural surface, underlying tissues friable.  CLINICAL NOTE:  [name] is a 40 year old man who presented with a complaint of chest pain and was found to have a right middle lobe abscess and large right pleural effusion.  The effusion was loculated. The patient is ward of the state. Consent for surgical drainage of the empyema and decortication was obtained from Herbie Saxon, who is a Arts development officer.  The indications, risks, benefits, and alternatives were discussed in detail with the patient.  He expressed understanding himself as well.  OPERATIVE NOTE:  [name] was brought the operating room on January 02, 2016.  He was already receiving vancomycin and Zosyn intravenously and was current on those medications.  No additional antibiotics were administered.  He was anesthetized and intubated.  A Foley catheter was placed.  Sequential compressive devices were placed on the calves for DVT prophylaxis.  After  performing a time-out, flexible fiberoptic bronchoscopy was performed via the endotracheal tube.  It revealed normal endobronchial anatomy with no endobronchial lesions.  There were purulent secretions from the right middle lobe bronchus.  These were sent for culture.  The patient was reintubated with a double-lumen endotracheal tube.  He was placed in a left lateral decubitus position, and the right chest was prepped and draped in the usual sterile fashion.  Single lung ventilation of the left lung was initiated and was tolerated well throughout the procedure.  An incision was made in the seventh intercostal space posterolaterally in line with the tip of the scapula. It was carried through the skin and subcutaneous tissue.  The chest was entered bluntly using a hemostat.  A finger was inserted into the chest. Some adhesions were taken down.  A sucker was placed into the chest, and murky yellow greenish fluid was evacuated.  This was sent for culture. A 5 mm port was advanced into the chest, and a thoracoscope was placed into the chest.  There was an obvious organizing empyema with extensive fibrinous exudate and multiple loculated fluid collections.  A 5 cm working incision was made in the fifth interspace anterolaterally. Blunt finger dissection was used to create space between the two incisions.  The loculations were broken up, and the fluid was evacuated. There was 1700 mL of fluid in all.  The underlying tissue was very friable.  There was an extensive fibrinous pleural peel on both the lung and chest wall.  The empyema extended to the apex posteriorly. Anteriorly, it was limited by adhesions, but the lung was freed up to an area of clear lung anteriorly along the length of the chest to ensure that the entire empyema was drained.  A visceral pleural decortication then was performed using careful blunt dissection.  The lung was intermittently ventilated to identify any remaining bands  of tissue that were limiting re-expansion.  A good result was obtained.  There was some loculated fluid in the major fissure laterally.  The fissures were not entered more anteriorly.  The parietal pleural peel then was removed.  The parietal pleura was extremely friable, and in some areas of the pleura came with the peel; in other areas, the exudate was more loosely bound to the parietal pleura and came off without removing the underlying pleural tissue itself.  The vast majority of the peel was removed.  There was extensive exudate in the costophrenic angle which was cleared.  The diaphragmatic surface was cleaned off as well.  The chest was copiously irrigated with warm saline.  Test inflation showed good expansion of the lung to fill the pleural space.  Two 36-French Blake drains were placed, one through the original port incision that was used for the scope and the second through a more anterior incision.  These were placed posteriorly and laterally respectively.  Again, the anterior space was not opened as there were adhesions in this area and no fluid on CT in that area.  Dual lung ventilation was resumed.  The chest tube was secured to skin with #1 silk sutures.  The working incision was closed with a running #1 Vicryl fascial suture, followed by 2-0 Vicryl subcutaneous suture, and a 3-0 Vicryl subcuticular suture.  The chest tubes were placed to suction. The patient was extubated in the operating room taken to the postanesthetic care unit in good condition.     Salvatore Decent Dorris Fetch, M.D.     SCH/MEDQ  D:  01/02/2016  T:  01/02/2016  Job:  [id number]

## 2016-01-02 NOTE — H&P (View-Only) (Signed)
Reason for Consult:Right lung abscess,pleural effusion Referring Physician: Dr. Isa Rankin Flay [name] is an 40 y.o. male.  HPI: 40 yo man with a past history significant for remote heavy ethanol abuse, tobacco abuse, hypertension, depression, anxiety, seizures and borderline personality disorder who presents a a 2 week history of right sided chest pain. He also complains of general malaise, some nonproductive cough and congestion. He denies fevers, chills or night sweats.   He came to the ED today. CXR showed white out of the right chest. A CT showed a large right pleural effusion. There is a likely right middle abscess. He complains of nausea secondary to morphine and requests a different pain med.  Past Medical History  Diagnosis Date  . Depression   . Anxiety   . Hypertension   . GERD (gastroesophageal reflux disease)   . Seizures [facility])     Past Surgical History  Procedure Laterality Date  . Finger surgery Left     5th digit    Family History  Problem Relation Age of Onset  . CAD Mother   . Diabetes Mellitus II Father     Social History:  reports that he has been smoking Cigarettes.  He has been smoking about 0.50 packs per day. He does not have any smokeless tobacco history on file. He reports that he does not drink alcohol or use illicit drugs.  Allergies: No Known Allergies  Medications:  Scheduled: . enoxaparin (LOVENOX) injection  40 mg Subcutaneous Q24H  . piperacillin-tazobactam (ZOSYN)  IV  3.375 g Intravenous Q8H  . vancomycin  1,000 mg Intravenous Q8H    Results for orders placed or performed during the hospital encounter of 12/31/15 (from the past 48 hour(s))  Basic metabolic panel     Status: Abnormal   Collection Time: 12/31/15  8:00 PM  Result Value Ref Range   Sodium 130 (L) 135 - 145 mmol/L   Potassium 4.5 3.5 - 5.1 mmol/L   Chloride 89 (L) 101 - 111 mmol/L   CO2 28 22 - 32 mmol/L   Glucose, Bld 112 (H) 65 - 99 mg/dL   BUN 21 (H) 6 - 20 mg/dL    Creatinine, Ser 0.88 0.61 - 1.24 mg/dL   Calcium 8.1 (L) 8.9 - 10.3 mg/dL   GFR calc non Af Amer >60 >60 mL/min   GFR calc Af Amer >60 >60 mL/min    Comment: (NOTE) The eGFR has been calculated using the CKD EPI equation. This calculation has not been validated in all clinical situations. eGFR's persistently <60 mL/min signify possible Chronic Kidney Disease.    Anion gap 13 5 - 15  CBC with Differential     Status: Abnormal   Collection Time: 12/31/15  8:00 PM  Result Value Ref Range   WBC 21.5 (H) 4.0 - 10.5 K/uL   RBC 4.19 (L) 4.22 - 5.81 MIL/uL   Hemoglobin 12.3 (L) 13.0 - 17.0 g/dL   HCT 36.5 (L) 39.0 - 52.0 %   MCV 87.1 78.0 - 100.0 fL   MCH 29.4 26.0 - 34.0 pg   MCHC 33.7 30.0 - 36.0 g/dL   RDW 13.2 11.5 - 15.5 %   Platelets 271 150 - 400 K/uL   Neutrophils Relative % 80 %   Neutro Abs 17.2 (H) 1.7 - 7.7 K/uL   Lymphocytes Relative 9 %   Lymphs Abs 1.9 0.7 - 4.0 K/uL   Monocytes Relative 11 %   Monocytes Absolute 2.4 (H) 0.1 - 1.0 K/uL  Eosinophils Relative 0 %   Eosinophils Absolute 0.0 0.0 - 0.7 K/uL   Basophils Relative 0 %   Basophils Absolute 0.0 0.0 - 0.1 K/uL  Brain natriuretic peptide     Status: None   Collection Time: 12/31/15  8:00 PM  Result Value Ref Range   B Natriuretic Peptide 62.6 0.0 - 100.0 pg/mL  I-Stat Troponin, ED (not at [facility])     Status: None   Collection Time: 12/31/15  8:38 PM  Result Value Ref Range   Troponin i, poc 0.00 0.00 - 0.08 ng/mL   Comment 3            Comment: Due to the release kinetics of cTnI, a negative result within the first hours of the onset of symptoms does not rule out myocardial infarction with certainty. If myocardial infarction is still suspected, repeat the test at appropriate intervals.   Culture, blood (routine x 2)     Status: None (Preliminary result)   Collection Time: 12/31/15  9:56 PM  Result Value Ref Range   Specimen Description BLOOD LEFT ARM    Special Requests AEROBIC BOTTLE ONLY 5ML     Culture NO GROWTH < 24 HOURS    Report Status PENDING   Culture, blood (routine x 2)     Status: None (Preliminary result)   Collection Time: 12/31/15 10:01 PM  Result Value Ref Range   Specimen Description BLOOD LEFT HAND    Special Requests AEROBIC BOTTLE ONLY 5ML    Culture NO GROWTH < 24 HOURS    Report Status PENDING     Dg Chest 2 View  12/31/2015  CLINICAL DATA:  Cough for 3 weeks, worsening shortness of breath. EXAM: CHEST  2 VIEW COMPARISON:  None. FINDINGS: There is complete opacity of the right hemi thorax. There is suggestion of air-fluid level/ cavity in the right lung base. The left lung is clear. The evaluation of the mediastinum and heart size are limited due to with loss of mediastinal and cardiac border. The visualized skeletal structures are unremarkable. IMPRESSION: Complete opacity of the right hemi thorax with suggestion of air-fluid level/ cavity in the right lung base, further evaluation with chest CT is recommended. Electronically Signed   By: Wei-Chen  Lin M.D.   On: 12/31/2015 20:21   Ct Chest W Contrast  12/31/2015  CLINICAL DATA:  Acute onset of cough and shortness of breath. Right-sided pleural effusion. Initial encounter. EXAM: CT CHEST WITH CONTRAST TECHNIQUE: Multidetector CT imaging of the chest was performed during intravenous contrast administration. CONTRAST:  100mL OMNIPAQUE IOHEXOL 300 MG/ML  SOLN COMPARISON:  Chest radiograph performed earlier today at 7:55 p.m. FINDINGS: A large loculated right-sided pleural effusion is noted, with surrounding atelectasis. An empyema cannot be excluded. There is a large 6.9 x 6.7 x 4.4 cm complex collection of fluid and air at the right middle lobe, with a prominent surrounding rind. This most likely reflects a lung abscess, though infection of an underlying necrotic mass cannot be excluded. The left lung is relatively clear.  No pneumothorax is seen. A subcarinal node measures up to 1.4 cm in short axis. No additional  mediastinal lymphadenopathy is seen. No pericardial effusion is identified. The great vessels are grossly unremarkable in appearance. The visualized portions of thyroid gland are unremarkable. No axillary lymphadenopathy is seen. The visualized portions of the liver are grossly unremarkable. The spleen appears mildly enlarged. The visualized portions of the pancreas, adrenal glands, kidneys and gallbladder are grossly unremarkable. An 8 mm   lymph node is noted adjacent to the distal esophagus. No acute osseous abnormalities are seen. There appears to be a chronic incompletely healed fracture at the upper body of the sternum, with associated sclerosis and mild underlying periosteal change. Underlying infection cannot be excluded. IMPRESSION: 1. Large 6.9 x 6.7 x 4.4 cm complex collection of fluid and air at the right middle lobe, with prominent surrounding rind. This most likely reflects a lung abscess, though infection of an underlying necrotic mass cannot be excluded. Further evaluation is recommended as deemed clinically appropriate. 2. Large loculated right-sided pleural effusion, occupying much of the right lung. Cannot exclude empyema. Surrounding atelectasis noted. 3. Enlarged subcarinal node, measuring 1.4 cm in short axis. Unusual 8 mm node noted adjacent to the distal esophagus. This may simply reflect infection, though malignancy cannot be entirely excluded. 4. Mild splenomegaly. 5. Apparent chronic incompletely healed fracture at the upper body of the sternum, with associated sclerosis and mild underlying periosteal change. Underlying infection cannot be excluded. Electronically Signed   By: Garald Balding M.D.   On: 12/31/2015 23:44    Review of Systems  Constitutional: Positive for malaise/fatigue. Negative for fever and chills.  HENT: Positive for congestion. Negative for sore throat.   Eyes: Negative for blurred vision and double vision.  Respiratory: Positive for cough, shortness of breath and  wheezing. Negative for sputum production and stridor.   Cardiovascular: Positive for chest pain (right sided pleuritic). Negative for orthopnea.  Gastrointestinal: Negative for heartburn, nausea and vomiting.  Genitourinary: Negative for dysuria and hematuria.  Musculoskeletal: Positive for back pain.       Chronic pain  Neurological: Positive for seizures. Negative for dizziness, speech change and focal weakness.  Psychiatric/Behavioral: Positive for depression. The patient is nervous/anxious.   All other systems reviewed and are negative.  Blood pressure 102/77, pulse 79, temperature 99.1 F (37.3 C), temperature source Oral, resp. rate 21, height _0  (1.803 m), weight 211 lb (95.709 kg), SpO2 95 %. Physical Exam  Vitals reviewed. Constitutional: He is oriented to person, place, and time. He appears well-developed and well-nourished. No distress.  HENT:  Head: Normocephalic and atraumatic.  Eyes: Conjunctivae and EOM are normal. No scleral icterus.  Neck: Neck supple. No thyromegaly present.  Cardiovascular: Normal rate, regular rhythm, normal heart sounds and intact distal pulses.  Exam reveals no gallop and no friction rub.   No murmur heard. Respiratory: Effort normal.  Absent BS right posteriorly, bronchophony anteriorly  GI: Soft. He exhibits no distension. There is no tenderness.  Musculoskeletal: Normal range of motion. He exhibits no edema.  Lymphadenopathy:    He has no cervical adenopathy.  Neurological: He is alert and oriented to person, place, and time. No cranial nerve deficit.  Motor grossly intact  Skin: Skin is warm and dry.    Assessment/Plan: 40 yo man with a right middle lobe abscess and large right pleural effusion c/w an empyema. The abscess itself can be treated with antibiotics but the effusion requires drainage. Given the appearance on CT and the time course of his illness I think the best option is to do a right VATS to drain the empyema and decorticate  the right lung.   I discussed  the general nature of the procedure, the need for general anesthesia, the incisions to be used, and the need for chest tube drainage postop with Mr. Slagter. I informed him of the expected hospital stay, overall recovery and short and long term outcomes. I reviewed the indications, risks,  benefits and alternatives with him. He understands the risks include, but are not limited to death, stroke, MI, DVT/PE, bleeding, possible need for transfusion, infections, prolonged air leaks, as well as other organ system dysfunction including renal, or GI complications. He accepts the risks and agrees to proceed.  Plan OR tomorrow AM depending on availability  Melrose Nakayama 01/01/2016, 2:49 PM

## 2016-01-02 NOTE — Progress Notes (Signed)
ANTIBIOTIC CONSULT NOTE - INITIAL  Pharmacy Consult for Vancomycin Indication: Lung abscess   Recent Labs  12/31/15 2000 01/01/16 2000 01/02/16 0346  WBC 21.5* 21.0* 20.8*  HGB 12.3* 11.8* 11.1*  PLT 271 241 250  CREATININE 0.88  --  0.71   Estimated Creatinine Clearance: 145 mL/min (by C-G formula based on Cr of 0.71).  Recent Labs  01/01/16 1555  VANCOTROUGH 86*    Assessment:  [id number] mass in right middle lung lobe. Right middle lobe abscess vs neoplasm and R pleural effusion (parapneumonic vs empyema vs malignant). WBC 21.5>20.8. R VATS 2/11. Vancomycin trough 86 done 2/10 at 1555 was NOT ordered and patient had only had one dose. Lab error and value un-interpretable. Please disregard and level will be re-drawn as a proper trough. Patient only received two doses 2/10 (should have been 3) and has missed AM dose today. Scr 0.7, CrCl 145, UOP full 24 hrs?  Vanco 2/9>>  Zosyn 2/9>>   Goal of Therapy:  Vancomycin trough level 15-20 mcg/ml  Plan:  Resume Vancomycin 1g IV q8hr after OR today and check trough after 3-5 scheduled doses at steady state.   Anielle Headrick S. Merilynn Finland, PharmD, [facility] Clinical Staff Pharmacist Pager [phone]  Misty Stanley Stillinger 01/02/2016,10:16 AM

## 2016-01-02 NOTE — Anesthesia Postprocedure Evaluation (Signed)
Anesthesia Post Note  Patient: [name]  Procedure(s) Performed: Procedure(s) (LRB): VIDEO BRONCHOSCOPY (N/A) VIDEO ASSISTED THORACOSCOPY (VATS)/DECORTICATION (Right) DRAINAGE OF PLEURAL EFFUSION (Right)  Patient location during evaluation: PACU Anesthesia Type: General Level of consciousness: awake and awake and alert Pain management: pain level controlled Vital Signs Assessment: post-procedure vital signs reviewed and stable Respiratory status: spontaneous breathing, nonlabored ventilation and patient connected to nasal cannula oxygen Anesthetic complications: no    Last Vitals:  Filed Vitals:   01/02/16 1545 01/02/16 1600  BP: 99/71 109/70  Pulse: 103 96  Temp:  36.7 C  Resp: 16 15    Last Pain:  Filed Vitals:   01/02/16 1610  PainSc: 4                  [name]

## 2016-01-02 NOTE — Anesthesia Preprocedure Evaluation (Addendum)
Anesthesia Evaluation  Patient identified by MRN, date of birth, ID band Patient awake    Reviewed: Allergy & Precautions, NPO status , Patient's Chart, lab work & pertinent test results  Airway Mallampati: II       Dental  (+) Edentulous Upper, Edentulous Lower   Pulmonary Current Smoker,     + decreased breath sounds      Cardiovascular hypertension, Pt. on medications  Rhythm:Regular     Neuro/Psych    GI/Hepatic   Endo/Other    Renal/GU      Musculoskeletal   Abdominal   Peds  Hematology   Anesthesia Other Findings   Reproductive/Obstetrics                         Anesthesia Physical Anesthesia Plan  ASA: III  Anesthesia Plan: General   Post-op Pain Management:    Induction: Intravenous  Airway Management Planned: Double Lumen EBT  Additional Equipment: Arterial line and CVP  Intra-op Plan:   Post-operative Plan: Extubation in OR  Informed Consent: I have reviewed the patients History and Physical, chart, labs and discussed the procedure including the risks, benefits and alternatives for the proposed anesthesia with the patient or authorized representative who has indicated his/her understanding and acceptance.   Dental advisory given  Plan Discussed with: Anesthesiologist and CRNA  Anesthesia Plan Comments:        Anesthesia Quick Evaluation

## 2016-01-02 NOTE — Anesthesia Procedure Notes (Signed)
Procedure Name: Intubation Date/Time: 01/02/2016 10:03 AM Performed by: Sharlene Dory E Pre-anesthesia Checklist: Patient identified, Emergency Drugs available, Suction available, Patient being monitored and Timeout performed Patient Re-evaluated:Patient Re-evaluated prior to inductionOxygen Delivery Method: Circle system utilized Preoxygenation: Pre-oxygenation with 100% oxygen Intubation Type: IV induction and Rapid sequence Laryngoscope Size: Mac and 4 Grade View: Grade I Tube type: Oral Tube size: 8.5 mm Number of attempts: 1 Airway Equipment and Method: Stylet Placement Confirmation: ETT inserted through vocal cords under direct vision,  positive ETCO2 and breath sounds checked- equal and bilateral Secured at: 22 cm Tube secured with: Tape Dental Injury: Teeth and Oropharynx as per pre-operative assessment

## 2016-01-02 NOTE — Brief Op Note (Addendum)
12/31/2015 - 01/02/2016  12:37 PM  PATIENT:  [name]  40 y.o. male  PRE-OPERATIVE DIAGNOSIS:  1. RIGHT MIDDLE LOBE LUNG ABSCESS 2. RIGHT EMPYEMA  POST-OPERATIVE DIAGNOSIS:  1. RIGHT MIDDLE LOBE LUNG ABSCESS 2. EARLY ORGANIZING RIGHT EMPYEMA  PROCEDURE:   VIDEO BRONCHOSCOPY,  RIGHT VIDEO ASSISTED THORACOSCOPY(VATS), DRAINAGE OF RIGHT EMPYEMA,  VISCERAL AND PARIETAL PLEURAL DECORTICATION  FINDINGS: Approximately 1700 cc of pleural fluid removed, early organizing empyema with extensive fibrinous exudate  SURGEON:  Surgeon(s) and Role:    * Loreli Slot, MD - Primary  PHYSICIAN ASSISTANT: Doree Fudge PA-C  ANESTHESIA:   general  EBL:  Total I/O In: -  Out: 1550 [Urine:150; Other:1300; Blood:100]  BLOOD ADMINISTERED:none  DRAINS: 2 32 Blake drains placed in the right pleural space   SPECIMEN:  Source of Specimen:  Right pleural fluid,parietal and visceral pleural peel  DISPOSITION OF SPECIMEN:  Pathology and culture  COUNTS CORRECT:  YES  PLAN OF CARE: Admit to inpatient   PATIENT DISPOSITION:  PACU - hemodynamically stable.   Delay start of Pharmacological VTE agent (>24hrs) due to surgical blood loss or risk of bleeding: yes

## 2016-01-02 NOTE — Transfer of Care (Signed)
Immediate Anesthesia Transfer of Care Note  Patient: [name]  Procedure(s) Performed: Procedure(s): VIDEO BRONCHOSCOPY (N/A) VIDEO ASSISTED THORACOSCOPY (VATS)/DECORTICATION (Right) DRAINAGE OF PLEURAL EFFUSION (Right)  Patient Location: PACU  Anesthesia Type:General  Level of Consciousness: awake, alert  and oriented  Airway & Oxygen Therapy: Patient Spontanous Breathing and Patient connected to face mask oxygen  Post-op Assessment: Report given to RN, Post -op Vital signs reviewed and stable and Patient moving all extremities X 4  Post vital signs: Reviewed and stable  Last Vitals:  Filed Vitals:   01/02/16 0355 01/02/16 1259  BP: 118/67 126/78  Pulse: 81 99  Temp: 37.2 C 36.8 C  Resp: 20 28    Complications: No apparent anesthesia complications

## 2016-01-02 NOTE — Progress Notes (Signed)
Pt arrived from PACU, VSS (soft BP with cuff and aline) notified PA, new orders provided.  Call bell within reach. Oriented to unit and routine. CMT and elink notified of arrival.  Will continue to monitor.

## 2016-01-03 ENCOUNTER — Inpatient Hospital Stay (HOSPITAL_COMMUNITY): Payer: Medicare Other

## 2016-01-03 DIAGNOSIS — R569 Unspecified convulsions: Secondary | ICD-10-CM

## 2016-01-03 DIAGNOSIS — D649 Anemia, unspecified: Secondary | ICD-10-CM

## 2016-01-03 LAB — BASIC METABOLIC PANEL
ANION GAP: 10 (ref 5–15)
BUN: 19 mg/dL (ref 6–20)
CO2: 29 mmol/L (ref 22–32)
Calcium: 7.4 mg/dL — ABNORMAL LOW (ref 8.9–10.3)
Chloride: 95 mmol/L — ABNORMAL LOW (ref 101–111)
Creatinine, Ser: 1.13 mg/dL (ref 0.61–1.24)
GLUCOSE: 132 mg/dL — AB (ref 65–99)
POTASSIUM: 4.6 mmol/L (ref 3.5–5.1)
Sodium: 134 mmol/L — ABNORMAL LOW (ref 135–145)

## 2016-01-03 LAB — CBC
HCT: 22.1 % — ABNORMAL LOW (ref 39.0–52.0)
HEMOGLOBIN: 7.4 g/dL — AB (ref 13.0–17.0)
MCH: 30 pg (ref 26.0–34.0)
MCHC: 33.5 g/dL (ref 30.0–36.0)
MCV: 89.5 fL (ref 78.0–100.0)
Platelets: 267 10*3/uL (ref 150–400)
RBC: 2.47 MIL/uL — AB (ref 4.22–5.81)
RDW: 13.9 % (ref 11.5–15.5)
WBC: 20.2 10*3/uL — AB (ref 4.0–10.5)

## 2016-01-03 LAB — BLOOD GAS, ARTERIAL
Acid-Base Excess: 3.8 mmol/L — ABNORMAL HIGH (ref 0.0–2.0)
BICARBONATE: 28.6 meq/L — AB (ref 20.0–24.0)
FIO2: 0.21
O2 Saturation: 96.5 %
PATIENT TEMPERATURE: 98.6
PH ART: 7.377 (ref 7.350–7.450)
TCO2: 30.2 mmol/L (ref 0–100)
pCO2 arterial: 49.9 mmHg — ABNORMAL HIGH (ref 35.0–45.0)
pO2, Arterial: 87.6 mmHg (ref 80.0–100.0)

## 2016-01-03 LAB — PREPARE RBC (CROSSMATCH)

## 2016-01-03 LAB — GLUCOSE, CAPILLARY: GLUCOSE-CAPILLARY: 116 mg/dL — AB (ref 65–99)

## 2016-01-03 MED ORDER — KETOROLAC TROMETHAMINE 30 MG/ML IJ SOLN
30.0000 mg | Freq: Four times a day (QID) | INTRAMUSCULAR | Status: DC
  Administered 2016-01-03 – 2016-01-04 (×4): 30 mg via INTRAVENOUS
  Filled 2016-01-03 (×4): qty 1

## 2016-01-03 MED ORDER — SODIUM CHLORIDE 0.9 % IV SOLN: Freq: Once | INTRAVENOUS | Status: DC

## 2016-01-03 MED ORDER — AMLODIPINE BESYLATE 2.5 MG PO TABS
2.5000 mg | ORAL_TABLET | Freq: Every day | ORAL | Status: DC
  Administered 2016-01-05: 2.5 mg via ORAL
  Filled 2016-01-03 (×2): qty 1

## 2016-01-03 NOTE — Progress Notes (Signed)
PROGRESS NOTE  [name] WUJ:[id number] DOB: [birth year] DOA: 12/31/2015 PCP: Pcp Not In System  HPI/Recap of past 24 hours: Post op day one, sitting in chair, not in respiratory distress, chest tube with serosanguinous drainage  Assessment/Plan: Principal Problem:   Pleural effusion, right Active Problems:   Cavitating mass in right middle lung lobe  Right middle lobe abscess with right empyema: s/p Bronchoscopy, right video-assisted thoracoscopy, drainage of empyema, visceral and parietal pleural decortication on 2/12. Continue vanc/zosyn/fentanyl pca, plan per Ct surgery.  Post op anemia: hgb 7.4, prbc x2unit ordered.  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  Code Status: full  Family Communication: patient   Disposition Plan: pending, ALF vs SNF.   Consultants:  Ct surgery  Procedures: 2/10: picc line placement for poor IV access. 2/11: Bronchoscopy, right video-assisted thoracoscopy, drainage of empyema, visceral and parietal pleural decortication. 2/12 prbc x2   Antibiotics:  Vanc/zosyn from admission   Objective: BP 121/71 mmHg  Pulse 74  Temp(Src) 97.9 F (36.6 C) (Oral)  Resp 18  Ht 5\' 9"  (1.753 m)  Wt 77.111 kg (170 lb)  BMI 25.09 kg/m2  SpO2 92%  Intake/Output Summary (Last 24 hours) at 01/03/16 1704 Last data filed at 01/03/16 1523  Gross per 24 hour  Intake 4453.75 ml  Output   2020 ml  Net 2433.75 ml   Filed Weights   12/31/15 1843 01/02/16 1630  Weight: 95.709 kg (211 lb) 77.111 kg (170 lb)    Exam:   General:  NAD  Cardiovascular: RRR  Respiratory: right chest post op changes , chest tube with serosanguinous drainage  Abdomen: Soft/ND/NT, positive BS  Musculoskeletal: No Edema  Neuro: aaox3, flat affect  Data Reviewed: Basic Metabolic Panel:  Recent Labs Lab 12/31/15 2000 01/02/16 0346 01/03/16 0512  NA 130* 132* 134*  K 4.5 4.0  4.6  CL 89* 92* 95*  CO2 28 29 29   GLUCOSE 112* 122* 132*  BUN 21* 13 19  CREATININE 0.88 0.71 1.13  CALCIUM 8.1* 7.7* 7.4*  MG  --  2.3  --    Liver Function Tests: No results for input(s): AST, ALT, ALKPHOS, BILITOT, PROT, ALBUMIN in the last 168 hours. No results for input(s): LIPASE, AMYLASE in the last 168 hours. No results for input(s): AMMONIA in the last 168 hours. CBC:  Recent Labs Lab 12/31/15 2000 01/01/16 2000 01/02/16 0346 01/03/16 0512  WBC 21.5* 21.0* 20.8* 20.2*  NEUTROABS 17.2*  --   --   --   HGB 12.3* 11.8* 11.1* 7.4*  HCT 36.5* 35.7* 33.9* 22.1*  MCV 87.1 87.9 88.5 89.5  PLT 271 241 250 267   Cardiac Enzymes:   No results for input(s): CKTOTAL, CKMB, CKMBINDEX, TROPONINI in the last 168 hours. BNP (last 3 results)  Recent Labs  12/31/15 2000  BNP 62.6    ProBNP (last 3 results) No results for input(s): PROBNP in the last 8760 hours.  CBG:  Recent Labs Lab 01/03/16 0648  GLUCAP 116*    Recent Results (from the past 240 hour(s))  Culture, blood (routine x 2)     Status: None (Preliminary result)   Collection Time: 12/31/15  9:56 PM  Result Value Ref Range Status   Specimen Description BLOOD LEFT ARM  Final   Special Requests AEROBIC BOTTLE ONLY  Final   Culture NO GROWTH 3 DAYS  Final   Report Status PENDING  Incomplete  Culture,  blood (routine x 2)     Status: None (Preliminary result)   Collection Time: 12/31/15 10:01 PM  Result Value Ref Range Status   Specimen Description BLOOD LEFT HAND  Final   Special Requests AEROBIC BOTTLE ONLY  Final   Culture NO GROWTH 3 DAYS  Final   Report Status PENDING  Incomplete  Surgical pcr screen     Status: None   Collection Time: 01/02/16  8:07 AM  Result Value Ref Range Status   MRSA, PCR NEGATIVE NEGATIVE Final   Staphylococcus aureus NEGATIVE NEGATIVE Final    Comment:        The Xpert SA Assay (FDA approved for NASAL specimens in patients over 41 years of age), is one  component of a comprehensive surveillance program.  Test performance has been validated by [facility] for patients greater than or equal to 41 year old. It is not intended to diagnose infection nor to guide or monitor treatment.   Culture, respiratory (NON-Expectorated)     Status: None (Preliminary result)   Collection Time: 01/02/16 10:19 AM  Result Value Ref Range Status   Specimen Description BRONCHIAL WASHINGS  Final   Special Requests POF ZOSYN 3.375 PER [facility] ORDER RESP CULTURE  Final   Gram Stain   Final    MODERATE WBC PRESENT, PREDOMINANTLY PMN NO SQUAMOUS EPITHELIAL CELLS SEEN NO ORGANISMS SEEN Performed at [facility]    Culture NO GROWTH Performed at [facility]   Final   Report Status PENDING  Incomplete  Culture, body fluid-bottle     Status: None (Preliminary result)   Collection Time: 01/02/16 11:47 AM  Result Value Ref Range Status   Specimen Description FLUID RIGHT PLEURAL  Final   Special Requests NONE  Final   Culture NO GROWTH < 24 HOURS  Final   Report Status PENDING  Incomplete  Gram stain     Status: None   Collection Time: 01/02/16 11:47 AM  Result Value Ref Range Status   Specimen Description FLUID RIGHT PLEURAL  Final   Special Requests NONE  Final   Gram Stain   Final    FEW WBC PRESENT, PREDOMINANTLY PMN NO ORGANISMS SEEN    Report Status 01/02/2016 FINAL  Final     Studies: Dg Chest Port 1 View  01/03/2016  CLINICAL DATA:  Cough for 2 weeks.  Recent pneumothorax EXAM: PORTABLE CHEST 1 VIEW COMPARISON:  January 02, 2016 chest radiograph and chest CT December 31, 2015 FINDINGS: Chest tubes remain on the right. Small right apical pneumothorax is stable. No tension component. Central catheter tip is at the cavoatrial junction. The apparent abscess in the right middle lobe is again noted with air collection surrounded by fluid, unchanged. There apparent layering effusion on the right. No new opacity is identified on the  right compared to 1 day prior. Left lung is clear. Heart size is within normal limits. The pulmonary vascularity appears unremarkable. No adenopathy is demonstrable by radiography. IMPRESSION: Small pneumothorax on the right is stable. No tension component. No change in tube and catheter positions. Pleural effusion on the right is felt to be stable as is apparent right middle lobe abscess. Left lung clear. No change in cardiac silhouette. Electronically Signed   By: Bretta Bang III M.D.   On: 01/03/2016 08:46    Scheduled Meds: . sodium chloride   Intravenous Once  . acetaminophen  1,000 mg Oral 4 times per day   Or  . acetaminophen (TYLENOL) oral  liquid 160 mg/5 mL  1,000 mg Oral 4 times per day  . [START ON 01/04/2016] amLODipine  2.5 mg Oral Daily  . bisacodyl  10 mg Oral Daily  . buPROPion  300 mg Oral Daily  . chlordiazePOXIDE  25 mg Oral TID WC & HS  . clonazePAM  1 mg Oral Daily  . clonazePAM  2 mg Oral 2 times per day  . divalproex  2,000 mg Oral QHS  . escitalopram  20 mg Oral Daily  . fentaNYL   Intravenous 6 times per day  . ketorolac  30 mg Intravenous 4 times per day  . mometasone-formoterol  2 puff Inhalation BID  . multivitamin with minerals  1 tablet Oral Daily  . pantoprazole  80 mg Oral Q1200  . piperacillin-tazobactam (ZOSYN)  IV  3.375 g Intravenous Q8H  . senna-docusate  1 tablet Oral QHS  . traZODone  300 mg Oral QHS  . vancomycin  1,000 mg Intravenous Q8H    Continuous Infusions: . dextrose 5 % and 0.9 % NaCl with KCl 20 mEq/L 50 mL/hr at 01/03/16 4098     Time spent:  Jareli Highland MD, PhD  [facility] Pager [phone]. If 7PM-7AM, please contact night-coverage at [email or web address], password [facility] 01/03/2016, 5:04 PM  LOS: 3 days

## 2016-01-03 NOTE — Progress Notes (Addendum)
      [address]       [name] [address]             [phone]       1 Day Post-Op Procedure(s) (LRB): VIDEO BRONCHOSCOPY (N/A) VIDEO ASSISTED THORACOSCOPY (VATS)/DECORTICATION (Right) DRAINAGE OF PLEURAL EFFUSION (Right)  Subjective: Patient with incisional pain and nurse noted a lot of drainage around chest tube sites.  Objective: Vital signs in last 24 hours: Temp:  [97.4 F (36.3 C)-98.9 F (37.2 C)] 97.4 F (36.3 C) (02/12 0752) Pulse Rate:  [70-115] 78 (02/12 0722) Cardiac Rhythm:  [-] Normal sinus rhythm (02/12 0800) Resp:  [14-52] 19 (02/12 0722) BP: (74-127)/(58-94) 107/64 mmHg (02/12 0722) SpO2:  [91 %-100 %] 95 % (02/12 0722) Arterial Line BP: (83-129)/(44-88) 84/78 mmHg (02/11 2200) Weight:  [170 lb (77.111 kg)] 170 lb (77.111 kg) (02/11 1630)     Intake/Output from previous day: 02/11 0701 - 02/12 0700 In: 4097.5 [P.O.:410; I.V.:3687.5] Out: 3345 [Urine:745; Blood:150; Chest Tube:1150]   Physical Exam:  Cardiovascular: RRR Pulmonary: Diminished at right base and left lung is clear Abdomen: Soft, non tender, bowel sounds present. Extremities: Mild bilateral lower extremity edema. Wounds: Dressing mostly dry and intact Chest Tubes: To suction, no air leak  Lab Results: CBC: Recent Labs  01/02/16 0346 01/03/16 0512  WBC 20.8* 20.2*  HGB 11.1* 7.4*  HCT 33.9* 22.1*  PLT 250 267   BMET:  Recent Labs  01/02/16 0346 01/03/16 0512  NA 132* 134*  K 4.0 4.6  CL 92* 95*  CO2 29 29  GLUCOSE 122* 132*  BUN 13 19  CREATININE 0.71 1.13  CALCIUM 7.7* 7.4*    PT/INR:  Recent Labs  01/01/16 2000  LABPROT 17.6*  INR 1.44   ABG:  INR: Will add last result for INR, ABG once components are confirmed Will add last 4 CBG results once components are confirmed  Assessment/Plan:  1. CV - SR in the 70's. 2.  Pulmonary - On room air. Chest tubes are to suction and had 1150 cc of sanguinous output since surgery. Chest tubes  to remain for now. CXR this am appears to show right pleural thickening/small effusion, small,stable right apical pneumothorax.Encourage incentive spirometer 3. ABL anemia-H and 7.4 and 22.1. Monitor 4. Remove a line and foley 5. Decrease IVF 6.ID-on Vanco and Zosyn for PNA. Pleural fluid gram stain shows no growth  ZIMMERMAN,DONIELLE MPA-C 01/03/2016,8:41 AM  Patient seen and examined, agree with above I don't think he is using the PCA effectively Will give toradol for 48 hours  Deloy Archey C. Dorris Fetch, MD [facility] [phone]

## 2016-01-03 NOTE — Progress Notes (Signed)
Sahara drain system was full, new sahara placed.

## 2016-01-04 ENCOUNTER — Encounter (HOSPITAL_COMMUNITY): Payer: Self-pay | Admitting: Thoracic Surgery (Cardiothoracic Vascular Surgery)

## 2016-01-04 ENCOUNTER — Inpatient Hospital Stay (HOSPITAL_COMMUNITY): Payer: Medicare Other

## 2016-01-04 DIAGNOSIS — E871 Hypo-osmolality and hyponatremia: Secondary | ICD-10-CM

## 2016-01-04 DIAGNOSIS — D62 Acute posthemorrhagic anemia: Secondary | ICD-10-CM

## 2016-01-04 LAB — CBC
HCT: 25.3 % — ABNORMAL LOW (ref 39.0–52.0)
HEMOGLOBIN: 8.6 g/dL — AB (ref 13.0–17.0)
MCH: 29.9 pg (ref 26.0–34.0)
MCHC: 34 g/dL (ref 30.0–36.0)
MCV: 87.8 fL (ref 78.0–100.0)
PLATELETS: 233 10*3/uL (ref 150–400)
RBC: 2.88 MIL/uL — AB (ref 4.22–5.81)
RDW: 14.4 % (ref 11.5–15.5)
WBC: 12.1 10*3/uL — AB (ref 4.0–10.5)

## 2016-01-04 LAB — URINE MICROSCOPIC-ADD ON

## 2016-01-04 LAB — URINALYSIS, ROUTINE W REFLEX MICROSCOPIC
Bilirubin Urine: NEGATIVE
GLUCOSE, UA: NEGATIVE mg/dL
Ketones, ur: NEGATIVE mg/dL
Nitrite: NEGATIVE
Protein, ur: NEGATIVE mg/dL
SPECIFIC GRAVITY, URINE: 1.015 (ref 1.005–1.030)
pH: 5.5 (ref 5.0–8.0)

## 2016-01-04 LAB — TYPE AND SCREEN
ABO/RH(D): AB POS
ANTIBODY SCREEN: NEGATIVE
UNIT DIVISION: 0
Unit division: 0

## 2016-01-04 LAB — COMPREHENSIVE METABOLIC PANEL
ALBUMIN: 1.3 g/dL — AB (ref 3.5–5.0)
ALK PHOS: 145 U/L — AB (ref 38–126)
ALT: 52 U/L (ref 17–63)
ANION GAP: 11 (ref 5–15)
AST: 87 U/L — ABNORMAL HIGH (ref 15–41)
BUN: 31 mg/dL — ABNORMAL HIGH (ref 6–20)
CALCIUM: 7.1 mg/dL — AB (ref 8.9–10.3)
CHLORIDE: 90 mmol/L — AB (ref 101–111)
CO2: 29 mmol/L (ref 22–32)
CREATININE: 1.77 mg/dL — AB (ref 0.61–1.24)
GFR calc non Af Amer: 46 mL/min — ABNORMAL LOW (ref >60)
GFR, EST AFRICAN AMERICAN: 54 mL/min — AB (ref >60)
GLUCOSE: 113 mg/dL — AB (ref 65–99)
Potassium: 4.1 mmol/L (ref 3.5–5.1)
SODIUM: 130 mmol/L — AB (ref 135–145)
Total Bilirubin: 0.7 mg/dL (ref 0.3–1.2)
Total Protein: 3.9 g/dL — ABNORMAL LOW (ref 6.5–8.1)

## 2016-01-04 LAB — VANCOMYCIN, RANDOM: Vancomycin Rm: 41 ug/mL

## 2016-01-04 NOTE — Evaluation (Signed)
Physical Therapy Evaluation Patient Details Name: [name] MRN: [id number] DOB: Sep 23, 1976 Today's Date: 01/04/2016   History of Present Illness  Patient is a 40 y/o male admitted with Cavitating mass in right middle lung lobe. 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  Now s/p Bronchoscopy w/ R VATS partial pleural decortication on 01/02/16.  Clinical Impression  Patient presents with decreased mobility due to deficits listed in PT problem list.  He will benefit from skilled PT in the acute setting to allow return home with intermittent assist from family/friends.  Unsure of services already in place as he reports gets 3 meals a day.  Will benefit from follow up HHPT and possible aide.    Follow Up Recommendations Home health PT;Other (comment) [facility] aide)    Equipment Recommendations  None recommended by PT    Recommendations for Other Services       Precautions / Restrictions Precautions Precaution Comments: Chest tube to water seal      Mobility  Bed Mobility Overal bed mobility: Needs Assistance Bed Mobility: Sit to Supine       Sit to supine: Supervision   General bed mobility comments: scooted back on bed [place] style and sat up until [facility] elevated  Transfers Overall transfer level: Needs assistance Equipment used: Rolling walker (2 wheeled) Transfers: Sit to/from Stand Sit to Stand: Min guard         General transfer comment: for steadying assist  Ambulation/Gait Ambulation/Gait assistance: Min guard Ambulation Distance (Feet): 300 Feet Assistive device: Rolling walker (2 wheeled) Gait Pattern/deviations: Step-through pattern;Decreased stride length     General Gait Details: Ambulated on RA, SpO2 not reading well at times due to using hand probe was on; read at 83% at lowest, but back up to 90's when removed his hand from walker  Stairs            Wheelchair Mobility    Modified Rankin (Stroke Patients Only)        Balance Overall balance assessment: Needs assistance   Sitting balance-Leahy Scale: Good       Standing balance-Leahy Scale: Fair                               Pertinent Vitals/Pain Pain Assessment: 0-10 Pain Score: 8  Pain Location: R side at surgical site Pain Descriptors / Indicators: Sharp;Sore Pain Intervention(s): Repositioned;PCA encouraged    Home Living Family/patient expects to be discharged to:: Private residence Living Arrangements: Alone Available Help at Discharge: Friend(s);Family Type of Home: Mobile home Home Access: Level entry     Home Layout: One level Home Equipment: None      Prior Function Level of Independence: Independent         Comments: did not drive, reports girlfriend able to help but works, states ACT team said he could live alone     Hand Dominance        Extremity/Trunk Assessment   Upper Extremity Assessment: Overall WFL for tasks assessed           Lower Extremity Assessment: Generalized weakness         Communication   Communication: No difficulties  Cognition Arousal/Alertness: Awake/alert Behavior During Therapy: Flat affect Overall Cognitive Status: No family/caregiver present to determine baseline cognitive functioning (oriented, but slow at times to respond which could be due to medications, also preseverates at times)  General Comments      Exercises        Assessment/Plan    PT Assessment Patient needs continued PT services  PT Diagnosis Difficulty walking;Acute pain;Generalized weakness   PT Problem List Decreased strength;Decreased activity tolerance;Decreased balance;Decreased mobility;Decreased knowledge of use of DME;Decreased knowledge of precautions  PT Treatment Interventions DME instruction   PT Goals (Current goals can be found in the Care Plan section) Acute Rehab PT Goals Patient Stated Goal: To go home PT Goal Formulation: With  patient Time For Goal Achievement: 01/11/16 Potential to Achieve Goals: Good    Frequency Min 3X/week   Barriers to discharge Decreased caregiver support unsure of support system; does have someone who makes or brings meals and possible ACT team services    Co-evaluation               End of Session   Activity Tolerance: Patient tolerated treatment well Patient left: in bed;with call bell/phone within reach           Time: 1515-1547 PT Time Calculation (min) (ACUTE ONLY): 32 min   Charges:   PT Evaluation $PT Eval Moderate Complexity: 1 Procedure PT Treatments $Gait Training: 8-22 mins   PT G CodesElray Mcgregor 2016-02-02, 4:49 PM  Sheran Lawless, PT [phone] 2016-02-02

## 2016-01-04 NOTE — Progress Notes (Signed)
ANTIBIOTIC CONSULT NOTE - INITIAL  Pharmacy Consult for Vancomycin Indication: Lung abscess   Recent Labs  01/02/16 0346 01/03/16 0512 01/04/16 0500  WBC 20.8* 20.2* 12.1*  HGB 11.1* 7.4* 8.6*  PLT 250 267 233  CREATININE 0.71 1.13 1.77*   Estimated Creatinine Clearance: 55.5 mL/min (by C-G formula based on Cr of 1.77).  Recent Labs  01/01/16 1555 01/04/16 1418  VANCOTROUGH 86*  --   VANCORANDOM  --  41    Assessment:  WU:[id number] mass in right middle lung lobe. Right middle lobe abscess vs neoplasm and R pleural effusion (parapneumonic vs empyema vs malignant). WBC 20.8 > 12. R VATS 2/11.  SCr 0.7 > 1.1 > 1.77.  Vanco 2/9>>  Zosyn 2/9>>  2/11 tissue cx: ngtd, few GPC 2/11 pleural fluid: ngtd 2/09 blood cx: ngtd  A vancomycin level was drawn at an inappropriate time last week, the patient was not at steady state. Regardless, the level came back exponentially high at 86. This was deemed to be a lab error.  Today we attempted to get a true trough. Unfortunately, the nurse hung the vancomycin before the trough was drawn. We were able to catch this and have the nurse stop the infusion after only 10-15 minutes.  The level came back high again at 44. Since this level is also difficult to interrupt, I will discontinue all vancomycin and get a random level in the morning. It is possible, though surprising that the patient is accumulating vancomycin, as evidenced by worsening Scr.  Goal of Therapy:  Vancomycin trough level 15-20 mcg/ml  Plan:  -Hold vancomycin, obtain random level tomorrow morning -Continue zosyn 3.375 g IV q8h   Baldemar Friday 01/04/2016,3:23 PM

## 2016-01-04 NOTE — Progress Notes (Signed)
PROGRESS NOTE  [name] ZOX:[id number] DOB: 11-06-1976 DOA: 12/31/2015 PCP: Pcp Not In System  HPI/Recap of past 24 hours: Post op day two, chest tube with serosanguinous drainage, hgb better, cr worsening, hyponatremia  Assessment/Plan: Principal Problem:   Pleural effusion, right Active Problems:   Cavitating mass in right middle lung lobe  Right middle lobe abscess with right empyema: s/p Bronchoscopy, right video-assisted thoracoscopy, drainage of empyema, visceral and parietal pleural decortication on 2/12. on vanc/zosyn/fentanyl pca, plan per Ct surgery.  Post op anemia: hgb 7.4 on 2/12, s/p prbc x2units on 2/12, hgb 8.6 on 2/13  Elevation of cr;   ua no infection,  toradol d/ced by CT surgery,  patient has been on vanc and zosyn, discussed with pharmacy, pharmacy to adjust dose.   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  Code Status: full  Family Communication: patient   Disposition Plan: pending, ALF vs SNF.   Consultants:  Ct surgery  Procedures: 2/10: picc line placement for poor IV access. 2/11: Bronchoscopy, right video-assisted thoracoscopy, drainage of empyema, visceral and parietal pleural decortication. 2/12 prbc x2   Antibiotics:  Vanc/zosyn from admission   Objective: BP 119/67 mmHg  Pulse 72  Temp(Src) 97.6 F (36.4 C) (Oral)  Resp 16  Ht 5\' 9"  (1.753 m)  Wt 77.111 kg (170 lb)  BMI 25.09 kg/m2  SpO2 95%  Intake/Output Summary (Last 24 hours) at 01/04/16 0809 Last data filed at 01/04/16 0800  Gross per 24 hour  Intake 5011.25 ml  Output   1715 ml  Net 3296.25 ml   Filed Weights   12/31/15 1843 01/02/16 1630  Weight: 95.709 kg (211 lb) 77.111 kg (170 lb)    Exam:   General:  NAD  Cardiovascular: RRR  Respiratory: right chest post op changes , chest tube with serosanguinous  drainage  Abdomen: Soft/ND/NT, positive BS  Musculoskeletal: No Edema  Neuro: aaox3, flat affect  Data Reviewed: Basic Metabolic Panel:  Recent Labs Lab 12/31/15 2000 01/02/16 0346 01/03/16 0512 01/04/16 0500  NA 130* 132* 134* 130*  K 4.5 4.0 4.6 4.1  CL 89* 92* 95* 90*  CO2 28 29 29 29   GLUCOSE 112* 122* 132* 113*  BUN 21* 13 19 31*  CREATININE 0.88 0.71 1.13 1.77*  CALCIUM 8.1* 7.7* 7.4* 7.1*  MG  --  2.3  --   --    Liver Function Tests:  Recent Labs Lab 01/04/16 0500  AST 87*  ALT 52  ALKPHOS 145*  BILITOT 0.7  PROT 3.9*  ALBUMIN 1.3*   No results for input(s): LIPASE, AMYLASE in the last 168 hours. No results for input(s): AMMONIA in the last 168 hours. CBC:  Recent Labs Lab 12/31/15 2000 01/01/16 2000 01/02/16 0346 01/03/16 0512 01/04/16 0500  WBC 21.5* 21.0* 20.8* 20.2* 12.1*  NEUTROABS 17.2*  --   --   --   --   HGB 12.3* 11.8* 11.1* 7.4* 8.6*  HCT 36.5* 35.7* 33.9* 22.1* 25.3*  MCV 87.1 87.9 88.5 89.5 87.8  PLT 271 241 250 267 233   Cardiac Enzymes:   No results for input(s): CKTOTAL, CKMB, CKMBINDEX, TROPONINI in the last 168 hours. BNP (last 3 results)  Recent Labs  12/31/15 2000  BNP 62.6    ProBNP (last 3 results) No results for input(s): PROBNP in the last 8760 hours.  CBG:  Recent Labs Lab  01/03/16 0648  GLUCAP 116*    Recent Results (from the past 240 hour(s))  Culture, blood (routine x 2)     Status: None (Preliminary result)   Collection Time: 12/31/15  9:56 PM  Result Value Ref Range Status   Specimen Description BLOOD LEFT ARM  Final   Special Requests AEROBIC BOTTLE ONLY  Final   Culture NO GROWTH 3 DAYS  Final   Report Status PENDING  Incomplete  Culture, blood (routine x 2)     Status: None (Preliminary result)   Collection Time: 12/31/15 10:01 PM  Result Value Ref Range Status   Specimen Description BLOOD LEFT HAND  Final   Special Requests AEROBIC BOTTLE ONLY  Final   Culture NO GROWTH 3 DAYS   Final   Report Status PENDING  Incomplete  Surgical pcr screen     Status: None   Collection Time: 01/02/16  8:07 AM  Result Value Ref Range Status   MRSA, PCR NEGATIVE NEGATIVE Final   Staphylococcus aureus NEGATIVE NEGATIVE Final    Comment:        The Xpert SA Assay (FDA approved for NASAL specimens in patients over 79 years of age), is one component of a comprehensive surveillance program.  Test performance has been validated by [facility] for patients greater than or equal to 93 year old. It is not intended to diagnose infection nor to guide or monitor treatment.   Culture, respiratory (NON-Expectorated)     Status: None (Preliminary result)   Collection Time: 01/02/16 10:19 AM  Result Value Ref Range Status   Specimen Description BRONCHIAL WASHINGS  Final   Special Requests POF ZOSYN 3.375 PER [facility] ORDER RESP CULTURE  Final   Gram Stain   Final    MODERATE WBC PRESENT, PREDOMINANTLY PMN NO SQUAMOUS EPITHELIAL CELLS SEEN NO ORGANISMS SEEN Performed at [facility]    Culture NO GROWTH Performed at [facility]   Final   Report Status PENDING  Incomplete  Culture, body fluid-bottle     Status: None (Preliminary result)   Collection Time: 01/02/16 11:47 AM  Result Value Ref Range Status   Specimen Description FLUID RIGHT PLEURAL  Final   Special Requests NONE  Final   Culture NO GROWTH < 24 HOURS  Final   Report Status PENDING  Incomplete  Gram stain     Status: None   Collection Time: 01/02/16 11:47 AM  Result Value Ref Range Status   Specimen Description FLUID RIGHT PLEURAL  Final   Special Requests NONE  Final   Gram Stain   Final    FEW WBC PRESENT, PREDOMINANTLY PMN NO ORGANISMS SEEN    Report Status 01/02/2016 FINAL  Final     Studies: Dg Chest Port 1 View  01/04/2016  CLINICAL DATA:  Empyema, history hypertension EXAM: PORTABLE CHEST 1 VIEW COMPARISON:  Portable exam 0629 hours compared 01/03/2016 FINDINGS: Tip of RIGHT jugular  line projects over cavoatrial junction. Pair of RIGHT thoracostomy tubes again identified. Stable heart size and mediastinal contours. Persistent pleural fluid versus thickening RIGHT hemi thorax with atelectasis of mid to lower RIGHT lung and questionable RIGHT lower lobe infiltrate. LEFT lung clear. No pneumothorax. IMPRESSION: Persistent RIGHT pleural effusion versus thickening. Persistent atelectasis versus consolidation of mid to lower RIGHT lung. No significant interval change. Electronically Signed   By: Ulyses Southward M.D.   On: 01/04/2016 08:00    Scheduled Meds: . sodium chloride   Intravenous Once  . acetaminophen  1,000 mg Oral 4 times per day   Or  . acetaminophen (TYLENOL) oral liquid 160 mg/5 mL  1,000 mg Oral 4 times per day  . amLODipine  2.5 mg Oral Daily  . bisacodyl  10 mg Oral Daily  . buPROPion  300 mg Oral Daily  . chlordiazePOXIDE  25 mg Oral TID WC & HS  . clonazePAM  1 mg Oral Daily  . clonazePAM  2 mg Oral 2 times per day  . divalproex  2,000 mg Oral QHS  . escitalopram  20 mg Oral Daily  . fentaNYL   Intravenous 6 times per day  . mometasone-formoterol  2 puff Inhalation BID  . multivitamin with minerals  1 tablet Oral Daily  . pantoprazole  80 mg Oral Q1200  . piperacillin-tazobactam (ZOSYN)  IV  3.375 g Intravenous Q8H  . senna-docusate  1 tablet Oral QHS  . traZODone  300 mg Oral QHS  . vancomycin  1,000 mg Intravenous Q8H    Continuous Infusions:     Time spent:  Laveah Gloster MD, PhD  [facility] Pager [phone]. If 7PM-7AM, please contact night-coverage at [email or web address], password [facility] 01/04/2016, 8:09 AM  LOS: 4 days

## 2016-01-04 NOTE — Care Management Important Message (Signed)
Important Message  Patient Details  Name: [name] MRN: [id number] Date of Birth: 1976/03/10   Medicare Important Message Given:  Yes    Rafeef Lau P Donie Lemelin 01/04/2016, 2:09 PM

## 2016-01-04 NOTE — Progress Notes (Addendum)
      [address]       [place] [address]             [phone]       2 Days Post-Op Procedure(s) (LRB): VIDEO BRONCHOSCOPY (N/A) VIDEO ASSISTED THORACOSCOPY (VATS)/DECORTICATION (Right) DRAINAGE OF PLEURAL EFFUSION (Right)  Subjective: Patient with some incisional pain this am  Objective: Vital signs in last 24 hours: Temp:  [97.4 F (36.3 C)-98.3 F (36.8 C)] 97.6 F (36.4 C) (02/13 0716) Pulse Rate:  [67-74] 72 (02/13 0321) Cardiac Rhythm:  [-] Normal sinus rhythm (02/13 0321) Resp:  [12-21] 16 (02/13 0321) BP: (103-127)/(58-93) 119/67 mmHg (02/13 0321) SpO2:  [91 %-100 %] 95 % (02/13 0321)     Intake/Output from previous day: 02/12 0701 - 02/13 0700 In: 5491.3 [P.O.:2700; I.V.:1341.3; Blood:700; IV Piggyback:750] Out: 2020 [Urine:1600; Chest Tube:420]   Physical Exam:  Cardiovascular: RRR Pulmonary: Diminished at right base and left lung is clear Abdomen: Soft, non tender, bowel sounds present. Extremities: Mild bilateral lower extremity edema. Wounds: Clean and dry Chest Tubes: To suction, no air leak  Lab Results: CBC:  Recent Labs  01/03/16 0512 01/04/16 0500  WBC 20.2* 12.1*  HGB 7.4* 8.6*  HCT 22.1* 25.3*  PLT 267 233   BMET:   Recent Labs  01/03/16 0512 01/04/16 0500  NA 134* 130*  K 4.6 4.1  CL 95* 90*  CO2 29 29  GLUCOSE 132* 113*  BUN 19 31*  CREATININE 1.13 1.77*  CALCIUM 7.4* 7.1*    PT/INR:   Recent Labs  01/01/16 2000  LABPROT 17.6*  INR 1.44   ABG:  INR: Will add last result for INR, ABG once components are confirmed Will add last 4 CBG results once components are confirmed  Assessment/Plan:  1. CV - SR in the 70's. 2.  Pulmonary - On room air. Chest tubes are to suction and had 420 cc of sanguinous output since surgery. Chest tubes placed to water seal to remain for now. CXR this am appears to show no pneumothorax, pleural thickening/small effusion.Encourage incentive spirometer 3. ABL  anemia-H and 8.6 and 25.3. Monitor 4.ID-on Vanco and Zosyn for PNA. Pleural fluid gram stain shows no growth 5. PT consult to assist with ambulation 6. Creatinine increased to 1.77 after Toradol, which was stopped. Check BMET in am 7. Hyponatremia-monitor  ZIMMERMAN,DONIELLE MPA-C 01/04/2016,8:05 AM   Patient seen and examined, agree with above Viviann Spare C. Dorris Fetch, MD [facility] [phone]'

## 2016-01-05 ENCOUNTER — Inpatient Hospital Stay (HOSPITAL_COMMUNITY): Payer: Medicare Other

## 2016-01-05 LAB — BASIC METABOLIC PANEL
ANION GAP: 10 (ref 5–15)
Anion gap: 11 (ref 5–15)
BUN: 35 mg/dL — ABNORMAL HIGH (ref 6–20)
BUN: 33 mg/dL — ABNORMAL HIGH (ref 6–20)
CALCIUM: 7.8 mg/dL — AB (ref 8.9–10.3)
CALCIUM: 7.6 mg/dL — AB (ref 8.9–10.3)
CHLORIDE: 94 mmol/L — AB (ref 101–111)
CO2: 28 mmol/L (ref 22–32)
CO2: 28 mmol/L (ref 22–32)
CREATININE: 2.43 mg/dL — AB (ref 0.61–1.24)
Chloride: 99 mmol/L — ABNORMAL LOW (ref 101–111)
Creatinine, Ser: 2.67 mg/dL — ABNORMAL HIGH (ref 0.61–1.24)
GFR calc non Af Amer: 32 mL/min — ABNORMAL LOW (ref >60)
GFR, EST AFRICAN AMERICAN: 37 mL/min — AB (ref >60)
GFR, EST AFRICAN AMERICAN: 33 mL/min — AB (ref >60)
GFR, EST NON AFRICAN AMERICAN: 28 mL/min — AB (ref >60)
Glucose, Bld: 103 mg/dL — ABNORMAL HIGH (ref 65–99)
Glucose, Bld: 94 mg/dL (ref 65–99)
Potassium: 5.2 mmol/L — ABNORMAL HIGH (ref 3.5–5.1)
Potassium: 4.9 mmol/L (ref 3.5–5.1)
SODIUM: 133 mmol/L — AB (ref 135–145)
Sodium: 137 mmol/L (ref 135–145)

## 2016-01-05 LAB — CULTURE, RESPIRATORY: CULTURE: NORMAL

## 2016-01-05 LAB — CULTURE, BLOOD (ROUTINE X 2)
CULTURE: NO GROWTH
Culture: NO GROWTH

## 2016-01-05 LAB — CBC
HEMATOCRIT: 27.1 % — AB (ref 39.0–52.0)
Hemoglobin: 9 g/dL — ABNORMAL LOW (ref 13.0–17.0)
MCH: 29.3 pg (ref 26.0–34.0)
MCHC: 33.2 g/dL (ref 30.0–36.0)
MCV: 88.3 fL (ref 78.0–100.0)
PLATELETS: 277 10*3/uL (ref 150–400)
RBC: 3.07 MIL/uL — ABNORMAL LOW (ref 4.22–5.81)
RDW: 14.1 % (ref 11.5–15.5)
WBC: 15.8 10*3/uL — AB (ref 4.0–10.5)

## 2016-01-05 LAB — VANCOMYCIN, RANDOM: VANCOMYCIN RM: 31 ug/mL

## 2016-01-05 IMAGING — CR DG ABDOMEN 1V
1 series · 2 of 2 positions shown · non-contrast
Comparison: CT abdomen and pelvis 09/11/2008.

CLINICAL DATA: Nausea.

EXAM:
ABDOMEN - 1 VIEW

[Series 1: t abdomen supine · 0.14mm/px · 2 of 2 slices shown]
[im 1/2]
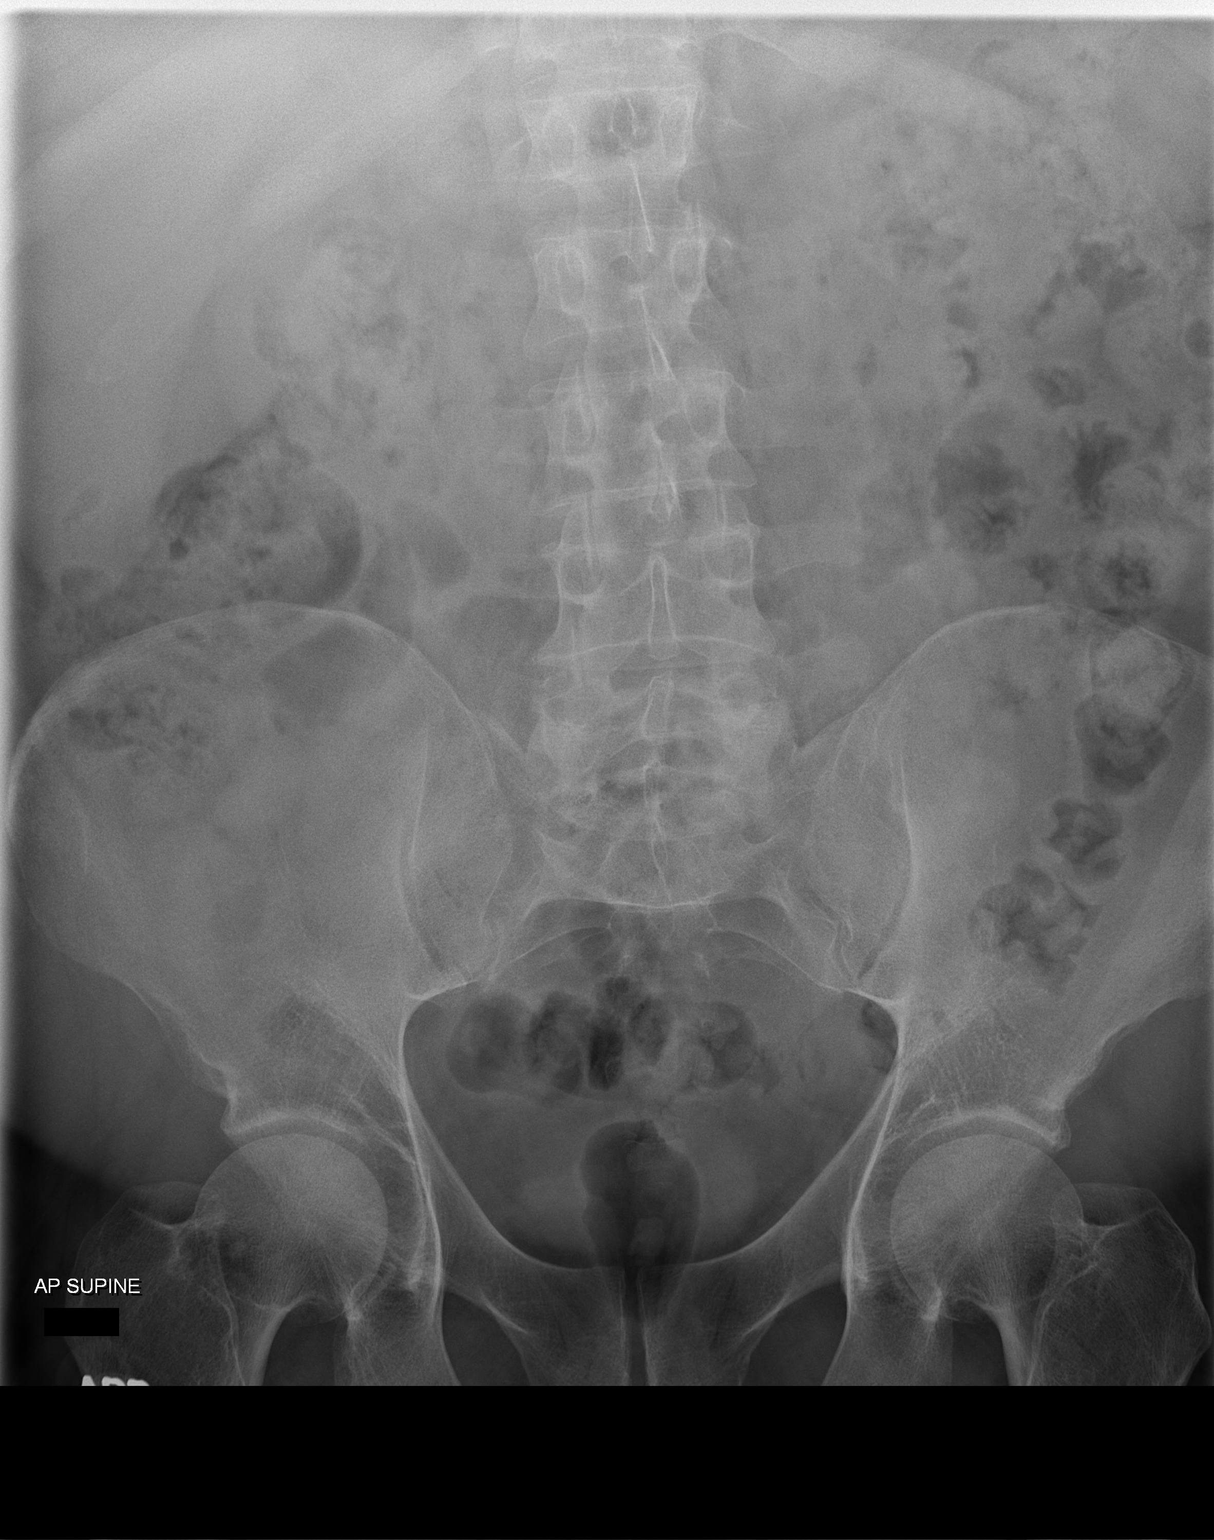
[im 2/2]
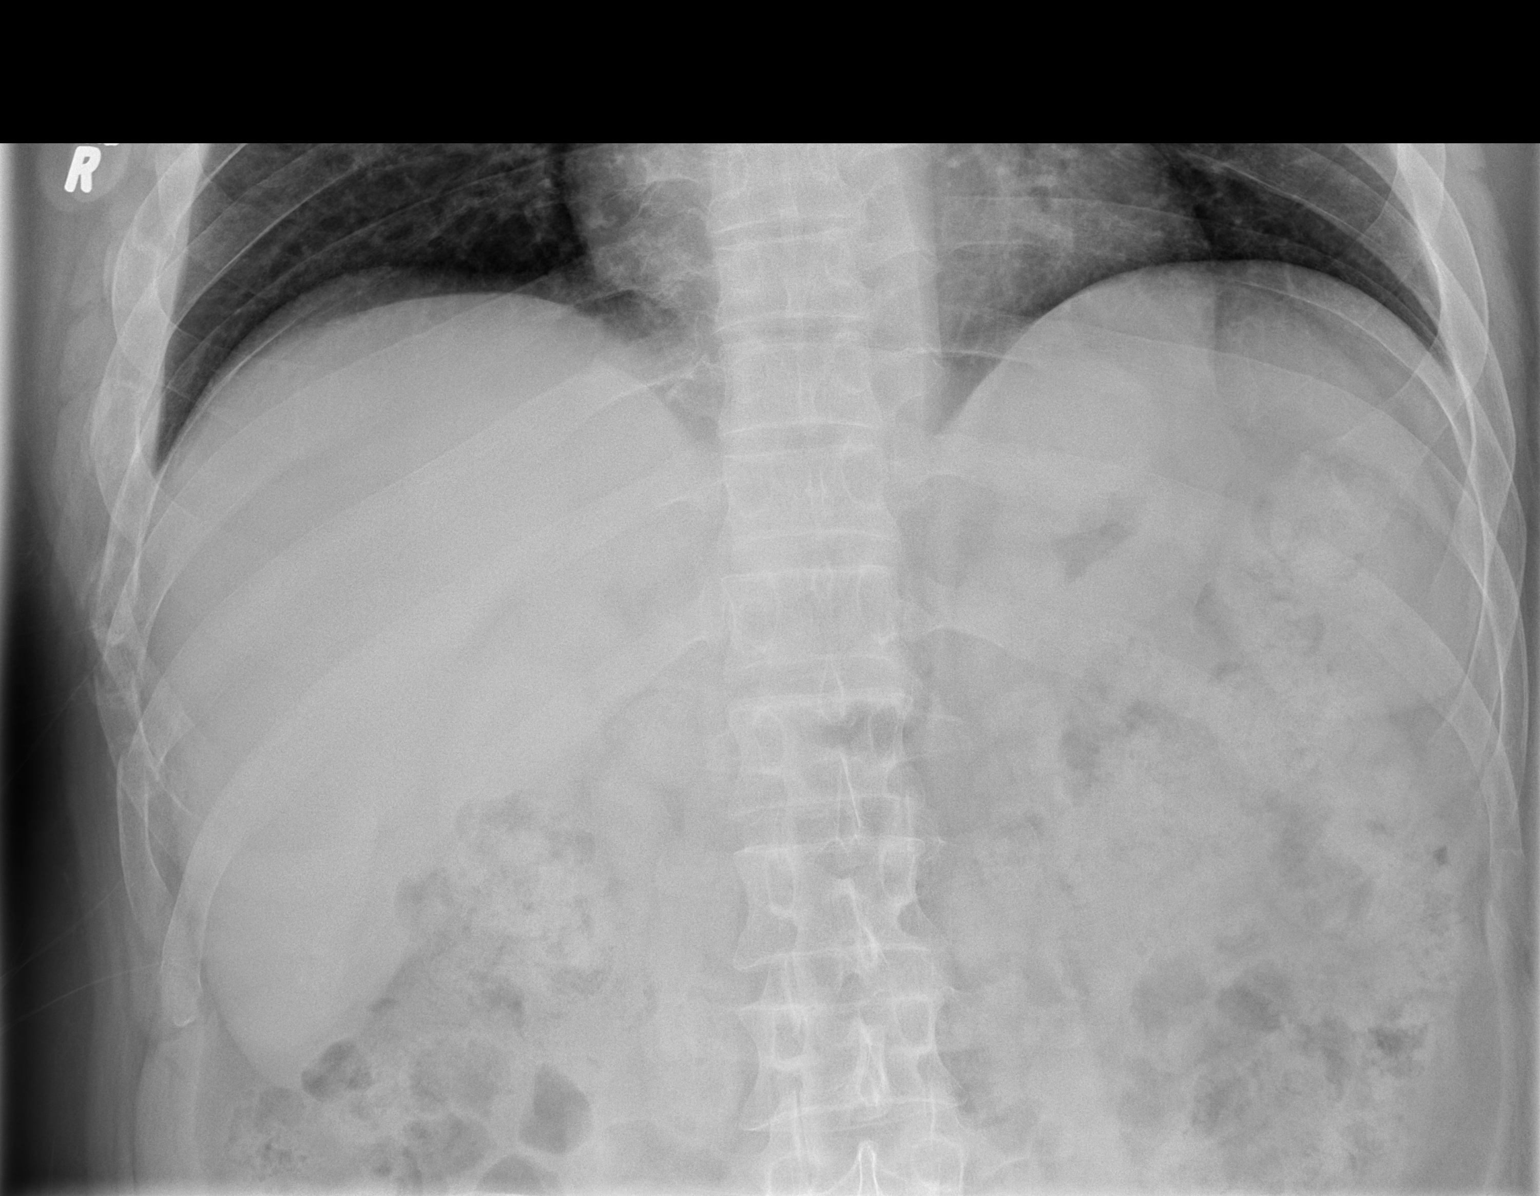

[2 of 2 positions shown; findings below may reference images not displayed]

FINDINGS: Bowel gas pattern unremarkable without evidence of obstruction or
significant ileus. Moderate stool burden throughout the colon from
cecum to rectum. No visible opaque urinary tract calculi. Mild
degenerative changes involving the lumbar spine.
IMPRESSION: No acute abdominal abnormality.

## 2016-01-05 MED ORDER — SODIUM CHLORIDE 0.9 % IV SOLN
1.0000 g | Freq: Two times a day (BID) | INTRAVENOUS | Status: DC
  Administered 2016-01-05 – 2016-01-09 (×8): 1 g via INTRAVENOUS
  Filled 2016-01-05 (×9): qty 1

## 2016-01-05 MED ORDER — ENOXAPARIN SODIUM 30 MG/0.3ML ~~LOC~~ SOLN
30.0000 mg | SUBCUTANEOUS | Status: DC
  Administered 2016-01-05 – 2016-01-07 (×3): 30 mg via SUBCUTANEOUS
  Filled 2016-01-05 (×3): qty 0.3

## 2016-01-05 MED ORDER — CLONAZEPAM 1 MG PO TABS
1.0000 mg | ORAL_TABLET | ORAL | Status: DC
  Administered 2016-01-05 – 2016-01-10 (×11): 1 mg via ORAL
  Filled 2016-01-05 (×12): qty 1

## 2016-01-05 MED ORDER — CLONAZEPAM 0.5 MG PO TABS
0.5000 mg | ORAL_TABLET | Freq: Every day | ORAL | Status: DC
  Administered 2016-01-05: 0.5 mg via ORAL
  Filled 2016-01-05: qty 1

## 2016-01-05 MED ORDER — PANTOPRAZOLE SODIUM 40 MG PO TBEC
40.0000 mg | DELAYED_RELEASE_TABLET | Freq: Every day | ORAL | Status: DC
  Administered 2016-01-05 – 2016-01-10 (×6): 40 mg via ORAL
  Filled 2016-01-05 (×5): qty 1

## 2016-01-05 NOTE — Progress Notes (Addendum)
[facility] DAILY ICU PROGRESS NOTE                   [address]            [place] [address]          [phone]   3 Days Post-Op Procedure(s) (LRB): VIDEO BRONCHOSCOPY (N/A) VIDEO ASSISTED THORACOSCOPY (VATS)/DECORTICATION (Right) DRAINAGE OF PLEURAL EFFUSION (Right)  Total Length of Stay:  LOS: 5 days   Subjective: Feels generally poor, no distress  Objective: Vital signs in last 24 hours: Temp:  [97.3 F (36.3 C)-97.9 F (36.6 C)] 97.9 F (36.6 C) (02/14 0719) Pulse Rate:  [59-79] 77 (02/14 0715) Cardiac Rhythm:  [-] Normal sinus rhythm (02/14 0735) Resp:  [11-23] 23 (02/14 0715) BP: (103-119)/(57-73) 113/73 mmHg (02/14 0715) SpO2:  [91 %-100 %] 95 % (02/14 0743)  Filed Weights   12/31/15 1843 01/02/16 1630  Weight: 211 lb (95.709 kg) 170 lb (77.111 kg)    Weight change:    Hemodynamic parameters for last 24 hours:    Intake/Output from previous day: 02/13 0701 - 02/14 0700 In: 2890 [P.O.:2520; I.V.:120; IV Piggyback:250] Out: 2560 [Urine:2225; Chest Tube:335]  Intake/Output this shift:    Current Meds: Scheduled Meds: . sodium chloride   Intravenous Once  . acetaminophen  1,000 mg Oral 4 times per day   Or  . acetaminophen (TYLENOL) oral liquid 160 mg/5 mL  1,000 mg Oral 4 times per day  . amLODipine  2.5 mg Oral Daily  . bisacodyl  10 mg Oral Daily  . buPROPion  300 mg Oral Daily  . chlordiazePOXIDE  25 mg Oral TID WC & HS  . clonazePAM  1 mg Oral Daily  . clonazePAM  2 mg Oral 2 times per day  . divalproex  2,000 mg Oral QHS  . enoxaparin (LOVENOX) injection  30 mg Subcutaneous Q24H  . escitalopram  20 mg Oral Daily  . fentaNYL   Intravenous 6 times per day  . mometasone-formoterol  2 puff Inhalation BID  . multivitamin with minerals  1 tablet Oral Daily  . pantoprazole  80 mg Oral Q1200  . piperacillin-tazobactam (ZOSYN)  IV  3.375 g Intravenous Q8H  . senna-docusate  1 tablet Oral QHS  . traZODone  300 mg Oral QHS    Continuous Infusions:  PRN Meds:.diphenhydrAMINE **OR** diphenhydrAMINE, naloxone **AND** sodium chloride flush, ondansetron (ZOFRAN) IV, oxyCODONE, potassium chloride, traMADol  General appearance: alert, cooperative and no distress Heart: regular rate and rhythm Lungs: din in lower right fields Abdomen: benign Extremities: warm  Wound: some drainae from CT sites, incis healing well  Lab Results: CBC: Recent Labs  01/04/16 0500 01/05/16 0430  WBC 12.1* 15.8*  HGB 8.6* 9.0*  HCT 25.3* 27.1*  PLT 233 277   BMET:  Recent Labs  01/04/16 0500 01/05/16 0430  NA 130* 133*  K 4.1 5.2*  CL 90* 94*  CO2 29 28  GLUCOSE 113* 103*  BUN 31* 35*  CREATININE 1.77* 2.43*  CALCIUM 7.1* 7.8*    FINAL DIAGNOSIS Diagnosis Pathology 1. Pleura, peel, Right - FIBRINOPURULENT MATERIAL. - THERE IS NO EVIDENCE OF MALIGNANCY. 2. Pleura, peel, Right - FIBRINOPURULENT MATERIAL. - THERE IS NO EVIDENCE OF MALIGNANCY. Pecola Leisure MD Pathologist, Electronic Signature (Case signed 01/05/2016)   Recent Results (from the past 240 hour(s))  Culture, blood (routine x 2)     Status: None (Preliminary result)   Collection Time: 12/31/15  9:56 PM  Result Value Ref  Range Status   Specimen Description BLOOD LEFT ARM  Final   Special Requests AEROBIC BOTTLE ONLY  Final   Culture NO GROWTH 4 DAYS  Final   Report Status PENDING  Incomplete  Culture, blood (routine x 2)     Status: None (Preliminary result)   Collection Time: 12/31/15 10:01 PM  Result Value Ref Range Status   Specimen Description BLOOD LEFT HAND  Final   Special Requests AEROBIC BOTTLE ONLY  Final   Culture NO GROWTH 4 DAYS  Final   Report Status PENDING  Incomplete  Surgical pcr screen     Status: None   Collection Time: 01/02/16  8:07 AM  Result Value Ref Range Status   MRSA, PCR NEGATIVE NEGATIVE Final   Staphylococcus aureus NEGATIVE NEGATIVE Final    Comment:        The Xpert SA Assay (FDA approved for NASAL  specimens in patients over 47 years of age), is one component of a comprehensive surveillance program.  Test performance has been validated by [facility] for patients greater than or equal to 82 year old. It is not intended to diagnose infection nor to guide or monitor treatment.   Culture, respiratory (NON-Expectorated)     Status: None   Collection Time: 01/02/16 10:19 AM  Result Value Ref Range Status   Specimen Description BRONCHIAL WASHINGS  Final   Special Requests POF ZOSYN 3.375 PER M.GENGLER ORDER RESP CULTURE  Final   Gram Stain   Final    MODERATE WBC PRESENT, PREDOMINANTLY PMN NO SQUAMOUS EPITHELIAL CELLS SEEN NO ORGANISMS SEEN Performed at [facility]    Culture   Final    NORMAL OROPHARYNGEAL FLORA Performed at [facility]    Report Status 01/05/2016 FINAL  Final  Anaerobic culture     Status: None (Preliminary result)   Collection Time: 01/02/16 11:27 AM  Result Value Ref Range Status   Specimen Description TISSUE RIGHT PLEURAL  Final   Special Requests RIGHT PLEURAL PEEL POF ZOSYN  Final   Gram Stain   Final    ABUNDANT WBC PRESENT,BOTH PMN AND MONONUCLEAR FEW SQUAMOUS EPITHELIAL CELLS PRESENT FEW GRAM POSITIVE COCCI IN CLUSTERS Performed at [facility]    Culture   Final    NO ANAEROBES ISOLATED; CULTURE IN PROGRESS FOR 5 DAYS Performed at [facility]    Report Status PENDING  Incomplete  Tissue culture     Status: None (Preliminary result)   Collection Time: 01/02/16 11:27 AM  Result Value Ref Range Status   Specimen Description TISSUE RIGHT PLEURAL  Final   Special Requests RIGHT PLEU PEEL POF ZOSYN  Final   Gram Stain   Final    ABUNDANT WBC PRESENT,BOTH PMN AND MONONUCLEAR FEW SQUAMOUS EPITHELIAL CELLS PRESENT FEW GRAM POSITIVE COCCI IN CLUSTERS Performed at [facility]    Culture   Final    NO GROWTH 2 DAYS Performed at [facility]    Report Status PENDING  Incomplete  Culture, body  fluid-bottle     Status: None (Preliminary result)   Collection Time: 01/02/16 11:47 AM  Result Value Ref Range Status   Specimen Description FLUID RIGHT PLEURAL  Final   Special Requests NONE  Final   Culture NO GROWTH 2 DAYS  Final   Report Status PENDING  Incomplete  Gram stain     Status: None   Collection Time: 01/02/16 11:47 AM  Result Value Ref Range Status   Specimen  Description FLUID RIGHT PLEURAL  Final   Special Requests NONE  Final   Gram Stain   Final    FEW WBC PRESENT, PREDOMINANTLY PMN NO ORGANISMS SEEN    Report Status 01/02/2016 FINAL  Final  Anaerobic culture     Status: None (Preliminary result)   Collection Time: 01/02/16 11:57 AM  Result Value Ref Range Status   Specimen Description TISSUE RIGHT  Final   Special Requests RIGHT VISCERAL PEEL POF ZOSYN  Final   Gram Stain   Final    MODERATE WBC PRESENT,BOTH PMN AND MONONUCLEAR NO SQUAMOUS EPITHELIAL CELLS SEEN NO ORGANISMS SEEN Performed at [facility]    Culture   Final    NO ANAEROBES ISOLATED; CULTURE IN PROGRESS FOR 5 DAYS Performed at [facility]    Report Status PENDING  Incomplete  Tissue culture     Status: None (Preliminary result)   Collection Time: 01/02/16 11:57 AM  Result Value Ref Range Status   Specimen Description TISSUE RIGHT  Final   Special Requests RIGHT VISCERAL PEEL POF ZOSYN  Final   Gram Stain   Final    MODERATE WBC PRESENT,BOTH PMN AND MONONUCLEAR NO SQUAMOUS EPITHELIAL CELLS SEEN NO ORGANISMS SEEN Performed at [facility]    Culture   Final    NO GROWTH 2 DAYS Performed at [facility]    Report Status PENDING  Incomplete     PT/INR: No results for input(s): LABPROT, INR in the last 72 hours. Radiology: Dg Chest Port 1 View  01/05/2016  CLINICAL DATA:  Follow-up pneumothorax. EXAM: PORTABLE CHEST 1 VIEW COMPARISON:  Portable chest x-ray of January 04, 2016. FINDINGS: On the right there is persistent increased density along the  periphery of the long and at the right lung base consistent with pleural fluid or pleural thickening. No pneumothorax or pneumomediastinum is evident. There is persistent increased interstitial density in the right lung base which is less conspicuous today. The right-sided chest tubes are in stable position. There is no mediastinal shift. The left lung is clear. The heart and pulmonary vascularity are normal. The right internal jugular venous catheter tip projects over the midportion of the SVC. IMPRESSION: 1. Improving appearance of the interstitium at the right lung base consistent with resolving atelectasis or infiltrate. There is stable pleural thickening or pleural fluid surrounding the convexity of the right lung with a small amount of the right lung base. 2. The low left lung and the heart and pulmonary vascularity are normal. 3. Stable appearance of the 2 chest tubes and the right internal jugular venous catheter. Electronically Signed   By: David  [place] M.D.   On: 01/05/2016 08:11   Chest tube- no air leak, 315 cc out yesterday  Assessment/Plan: S/P Procedure(s) (LRB): VIDEO BRONCHOSCOPY (N/A) VIDEO ASSISTED THORACOSCOPY (VATS)/DECORTICATION (Right) DRAINAGE OF PLEURAL EFFUSION (Right)  1 Chest tube drainage decreasing, CXR appearance improving 2 neg path 3 cultures no growth so far 4 increased leukocytosis, no fevers, - cont current abx- zosyn 5 creat has bumped to 2.43. He is on so much sedation,  I'm concerned there could be nephrotoxicity from so many meds- will ask pharmacy to consult to asist with drug dosing 6 H/H is improved    GOLD,WAYNE E 01/05/2016 8:52 AM  Patient seen and examined, agree with above. He was actually a little less oversedated when I saw him this morning. Creatinine rising, may just be due to toradol but that was stopped yesterday morning. If so  should turn around relatively quickly. Will defer further w/u and management of renal issues to the medical  team.\ Keep both CT in place today.  Salvatore Decent Dorris Fetch, MD [facility] [phone]

## 2016-01-05 NOTE — Progress Notes (Signed)
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 on abx/fentanyl pca, culture pending , plan per Ct surgery.  Post op anemia: hgb 7.4 on 2/12, s/p prbc x2units on 2/12, hgb stable after transfusion  Elevation of cr /arf ua no infection,  toradol d/ced by CT surgery,  patient has been on vanc and zosyn, discussed with pharmacy, pharmacy to adjust dose. Cr continue to increase, k 5.2, bun 35, urine output 2.2liter last 24hrs,  repeat bmp this pm. May need nephro consult if renal function continue to decrease.   Hyponatremia: d/c d5/half saline, repeat bmp in am, improved.  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 Patient is very drowsy, he does has elevated bun from renal failure, he is also on a lots of sedative meds, review last discharge summary in 10/2015 by psych he was supposed to taper off librium, however meds intake on admission , patient is still on high dose scheduled librium, I have contacted pharmacy to confirm home meds with SNF, taper patient off librium.   Code Status: full  Family Communication: patient   Disposition Plan: pending, ALF vs SNF.   Consultants:  Ct surgery  Procedures: 2/10: picc line placement for poor IV access. 2/11: Bronchoscopy, right video-assisted thoracoscopy, drainage of empyema, visceral and parietal pleural decortication. 2/12 prbc x2    Antibiotics:  Vanc/zosyn from admission   Objective: BP 108/63 mmHg  Pulse 77  Temp(Src) 97.5 F (36.4 C) (Oral)  Resp 19  Ht 5\' 9"  (1.753 m)  Wt 77.111 kg (170 lb)  BMI 25.09 kg/m2  SpO2 100%  Intake/Output Summary (Last 24 hours) at 01/05/16 1611 Last data filed at 01/05/16 1300  Gross per 24 hour  Intake   1280 ml  Output   2055 ml  Net   -775 ml   Filed Weights   12/31/15 1843 01/02/16 1630  Weight: 95.709 kg (211 lb) 77.111 kg (170 lb)    Exam:   General:  NAD, drowsy  Cardiovascular: RRR  Respiratory: right chest post op changes , chest tube with serosanguinous drainage  Abdomen: Soft/ND/NT, positive BS  Musculoskeletal: No Edema  Neuro: aaox3, flat affect  Data Reviewed: Basic Metabolic Panel:  Recent Labs Lab 12/31/15 2000 01/02/16 0346 01/03/16 0512 01/04/16 0500 01/05/16 0430  NA 130* 132* 134* 130* 133*  K 4.5 4.0 4.6 4.1 5.2*  CL 89* 92* 95* 90* 94*  CO2 28 29 29 29 28   GLUCOSE 112* 122* 132* 113* 103*  BUN 21* 13 19 31* 35*  CREATININE 0.88 0.71 1.13 1.77* 2.43*  CALCIUM 8.1* 7.7* 7.4* 7.1* 7.8*  MG  --  2.3  --   --   --    Liver Function Tests:  Recent Labs Lab 01/04/16 0500  AST 87*  ALT 52  ALKPHOS 145*  BILITOT 0.7  PROT 3.9*  ALBUMIN 1.3*   No results for input(s): LIPASE, AMYLASE in the last 168 hours. No results for input(s): AMMONIA in the last 168 hours.  CBC:  Recent Labs Lab 12/31/15 2000 01/01/16 2000 01/02/16 0346 01/03/16 0512 01/04/16 0500 01/05/16 0430  WBC 21.5* 21.0* 20.8* 20.2* 12.1* 15.8*  NEUTROABS 17.2*  --   --   --   --   --   HGB 12.3* 11.8* 11.1* 7.4* 8.6* 9.0*  HCT 36.5* 35.7* 33.9* 22.1* 25.3* 27.1*  MCV 87.1 87.9 88.5 89.5 87.8 88.3  PLT 271 241 250 267 233 277   Cardiac Enzymes:   No results for input(s): CKTOTAL, CKMB, CKMBINDEX, TROPONINI in the last 168 hours. BNP (last 3 results)  Recent Labs  12/31/15 2000  BNP 62.6    ProBNP (last 3 results) No results for  input(s): PROBNP in the last 8760 hours.  CBG:  Recent Labs Lab 01/03/16 0648  GLUCAP 116*    Recent Results (from the past 240 hour(s))  Culture, blood (routine x 2)     Status: None (Preliminary result)   Collection Time: 12/31/15  9:56 PM  Result Value Ref Range Status   Specimen Description BLOOD LEFT ARM  Final   Special Requests AEROBIC BOTTLE ONLY  Final   Culture NO GROWTH 4 DAYS  Final   Report Status PENDING  Incomplete  Culture, blood (routine x 2)     Status: None (Preliminary result)   Collection Time: 12/31/15 10:01 PM  Result Value Ref Range Status   Specimen Description BLOOD LEFT HAND  Final   Special Requests AEROBIC BOTTLE ONLY  Final   Culture NO GROWTH 4 DAYS  Final   Report Status PENDING  Incomplete  Surgical pcr screen     Status: None   Collection Time: 01/02/16  8:07 AM  Result Value Ref Range Status   MRSA, PCR NEGATIVE NEGATIVE Final   Staphylococcus aureus NEGATIVE NEGATIVE Final    Comment:        The Xpert SA Assay (FDA approved for NASAL specimens in patients over 81 years of age), is one component of a comprehensive surveillance program.  Test performance has been validated by [facility] for patients greater than or equal to 34 year old. It is not intended to diagnose infection nor to guide or monitor treatment.   Culture, respiratory (NON-Expectorated)     Status: None   Collection Time: 01/02/16 10:19 AM  Result Value Ref Range Status   Specimen Description BRONCHIAL WASHINGS  Final   Special Requests POF ZOSYN 3.375 PER M.GENGLER ORDER RESP CULTURE  Final   Gram Stain   Final    MODERATE WBC PRESENT, PREDOMINANTLY PMN NO SQUAMOUS EPITHELIAL CELLS SEEN NO ORGANISMS SEEN Performed at [facility]    Culture   Final    NORMAL OROPHARYNGEAL FLORA Performed at [facility]    Report Status 01/05/2016 FINAL  Final  Anaerobic culture     Status: None (Preliminary result)   Collection Time: 01/02/16 11:27  AM  Result Value Ref Range Status   Specimen Description TISSUE RIGHT PLEURAL  Final   Special Requests RIGHT PLEURAL PEEL POF ZOSYN  Final   Gram Stain   Final    ABUNDANT WBC PRESENT,BOTH PMN AND MONONUCLEAR FEW SQUAMOUS EPITHELIAL CELLS PRESENT FEW GRAM POSITIVE COCCI IN CLUSTERS Performed at [facility]    Culture   Final    NO ANAEROBES ISOLATED; CULTURE IN PROGRESS FOR 5 DAYS Performed at [facility]    Report Status PENDING  Incomplete  Tissue culture     Status: None (Preliminary result)   Collection  Time: 01/02/16 11:27 AM  Result Value Ref Range Status   Specimen Description TISSUE RIGHT PLEURAL  Final   Special Requests RIGHT PLEU PEEL POF ZOSYN  Final   Gram Stain   Final    ABUNDANT WBC PRESENT,BOTH PMN AND MONONUCLEAR FEW SQUAMOUS EPITHELIAL CELLS PRESENT FEW GRAM POSITIVE COCCI IN CLUSTERS Performed at [facility]    Culture   Final    NO GROWTH 2 DAYS Performed at [facility]    Report Status PENDING  Incomplete  Culture, body fluid-bottle     Status: None (Preliminary result)   Collection Time: 01/02/16 11:47 AM  Result Value Ref Range Status   Specimen Description FLUID RIGHT PLEURAL  Final   Special Requests NONE  Final   Culture NO GROWTH 2 DAYS  Final   Report Status PENDING  Incomplete  Gram stain     Status: None   Collection Time: 01/02/16 11:47 AM  Result Value Ref Range Status   Specimen Description FLUID RIGHT PLEURAL  Final   Special Requests NONE  Final   Gram Stain   Final    FEW WBC PRESENT, PREDOMINANTLY PMN NO ORGANISMS SEEN    Report Status 01/02/2016 FINAL  Final  Anaerobic culture     Status: None (Preliminary result)   Collection Time: 01/02/16 11:57 AM  Result Value Ref Range Status   Specimen Description TISSUE RIGHT  Final   Special Requests RIGHT VISCERAL PEEL POF ZOSYN  Final   Gram Stain   Final    MODERATE WBC PRESENT,BOTH PMN AND MONONUCLEAR NO SQUAMOUS EPITHELIAL CELLS SEEN NO  ORGANISMS SEEN Performed at [facility]    Culture   Final    NO ANAEROBES ISOLATED; CULTURE IN PROGRESS FOR 5 DAYS Performed at [facility]    Report Status PENDING  Incomplete  Tissue culture     Status: None (Preliminary result)   Collection Time: 01/02/16 11:57 AM  Result Value Ref Range Status   Specimen Description TISSUE RIGHT  Final   Special Requests RIGHT VISCERAL PEEL POF ZOSYN  Final   Gram Stain   Final    MODERATE WBC PRESENT,BOTH PMN AND MONONUCLEAR NO SQUAMOUS EPITHELIAL CELLS SEEN NO ORGANISMS SEEN Performed at [facility]    Culture   Final    NO GROWTH 2 DAYS Performed at [facility]    Report Status PENDING  Incomplete     Studies: Dg Chest Port 1 View  01/05/2016  CLINICAL DATA:  Follow-up pneumothorax. EXAM: PORTABLE CHEST 1 VIEW COMPARISON:  Portable chest x-ray of January 04, 2016. FINDINGS: On the right there is persistent increased density along the periphery of the long and at the right lung base consistent with pleural fluid or pleural thickening. No pneumothorax or pneumomediastinum is evident. There is persistent increased interstitial density in the right lung base which is less conspicuous today. The right-sided chest tubes are in stable position. There is no mediastinal shift. The left lung is clear. The heart and pulmonary vascularity are normal. The right internal jugular venous catheter tip projects over the midportion of the SVC. IMPRESSION: 1. Improving appearance of the interstitium at the right lung base consistent with resolving atelectasis or infiltrate. There is stable pleural thickening or pleural fluid surrounding the convexity of the right lung with a small amount of the right lung base. 2. The low left lung and the heart and pulmonary vascularity are normal. 3. Stable appearance of the 2 chest tubes and  the right internal jugular venous catheter. Electronically Signed   By: David  [place] M.D.   On:  01/05/2016 08:11    Scheduled Meds: . sodium chloride   Intravenous Once  . acetaminophen  1,000 mg Oral 4 times per day   Or  . acetaminophen (TYLENOL) oral liquid 160 mg/5 mL  1,000 mg Oral 4 times per day  . amLODipine  2.5 mg Oral Daily  . bisacodyl  10 mg Oral Daily  . buPROPion  300 mg Oral Daily  . chlordiazePOXIDE  25 mg Oral TID WC & HS  . clonazePAM  0.5 mg Oral Daily  . clonazePAM  1 mg Oral 2 times per day  . divalproex  2,000 mg Oral QHS  . enoxaparin (LOVENOX) injection  30 mg Subcutaneous Q24H  . escitalopram  20 mg Oral Daily  . fentaNYL   Intravenous 6 times per day  . meropenem (MERREM) IV  1 g Intravenous Q12H  . mometasone-formoterol  2 puff Inhalation BID  . multivitamin with minerals  1 tablet Oral Daily  . pantoprazole  40 mg Oral Q1200  . senna-docusate  1 tablet Oral QHS  . traZODone  300 mg Oral QHS    Continuous Infusions:     Time spent:  Khaleb Broz MD, PhD  [facility] Pager [phone]. If 7PM-7AM, please contact night-coverage at [email or web address], password [email or web address] 01/05/2016, 4:11 PM  LOS: 5 days

## 2016-01-05 NOTE — Progress Notes (Signed)
Pt appears very lethargic and sedated, pt responds to voice and follows commands with very flat affect and quickly falls back asleep. Pt frequently forgets task at hand, reorientation required to complete task. MD notified. Vital signs stable. Will continue to monitor.

## 2016-01-05 NOTE — Progress Notes (Signed)
Pharmacy Antibiotic Note  Dashiel Bergquist is a 40 y.o. male admitted on 12/31/2015 with empyema. S/p VATS and drainage of pleural effusion. Pt having significant nephrotoxicity (SCr 0.7 > 1.7 > 2.4), likely from vancomycin and zosyn, exacerbated by ketorolac (ketorolac now discontinued). All cultures are showing no growth.  Discussed some recommendations with PA, Lovenox to Heparin, will keep Lovenox for now, but recommend switching to heparin if SCr worsens. None of the current sedating agents are nephrotoxic, however with his worsening renal function, the agents will likely stay in his system longer, and may make him more sedated. We decided to reduce the dose of the Klonopin for now. The patient is on scheduled Librium and Klonopin, does he need this much sedation?  Plan: -Stop Zosyn, change to meropenem 1 g IV q12h -Check a vancomycin level tomorrow, re-dose as appropriate, though recommend to discontinue vancomycin all together given no signs of mrsa -Watch renal fx, sedation etc. -Can reduce sedation further tomorrow    Height:  (175.3 cm) Weight: 170 lb (77.111 kg) IBW/kg (Calculated) : 70.7  Temp (24hrs), Avg:97.6 F (36.4 C), Min:97.3 F (36.3 C), Max:97.9 F (36.6 C)   Recent Labs Lab 12/31/15 2000 01/01/16 1555 01/01/16 2000 01/02/16 0346 01/03/16 0512 01/04/16 0500 01/04/16 1418 01/05/16 0430  WBC 21.5*  --  21.0* 20.8* 20.2* 12.1*  --  15.8*  CREATININE 0.88  --   --  0.71 1.13 1.77*  --  2.43*  VANCOTROUGH  --  86*  --   --   --   --   --   --   VANCORANDOM  --   --   --   --   --   --  41 31    Estimated Creatinine Clearance: 40.4 mL/min (by C-G formula based on Cr of 2.43).    No Known Allergies  Antimicrobials this admission: Vancomycin 2/9 > Zosyn 2/9 > 2/14 Meropenem 2/14 >  Dose adjustments this admission: 2/10 VT: 86 (thought to be lab error) 2/13 VR: 41  2/14 VR: 31  Microbiology results: 2/9: BC x 2: pending 2/11 tissue cx: ngtd 2/11  brochial wash cx: ngtd 2/09 MRSA screen neg  Thank you for allowing pharmacy to be a part of this patient's care.  Baldemar Friday 01/05/2016 10:01 AM

## 2016-01-06 ENCOUNTER — Inpatient Hospital Stay (HOSPITAL_COMMUNITY): Payer: Medicare Other

## 2016-01-06 DIAGNOSIS — G9340 Encephalopathy, unspecified: Secondary | ICD-10-CM

## 2016-01-06 DIAGNOSIS — N179 Acute kidney failure, unspecified: Secondary | ICD-10-CM

## 2016-01-06 DIAGNOSIS — J948 Other specified pleural conditions: Secondary | ICD-10-CM

## 2016-01-06 LAB — CBC
HCT: 25.2 % — ABNORMAL LOW (ref 39.0–52.0)
HEMOGLOBIN: 8.3 g/dL — AB (ref 13.0–17.0)
MCH: 30.4 pg (ref 26.0–34.0)
MCHC: 32.9 g/dL (ref 30.0–36.0)
MCV: 92.3 fL (ref 78.0–100.0)
Platelets: 261 10*3/uL (ref 150–400)
RBC: 2.73 MIL/uL — AB (ref 4.22–5.81)
RDW: 15 % (ref 11.5–15.5)
WBC: 11.6 10*3/uL — ABNORMAL HIGH (ref 4.0–10.5)

## 2016-01-06 LAB — VANCOMYCIN, RANDOM: VANCOMYCIN RM: 17 ug/mL

## 2016-01-06 LAB — TISSUE CULTURE

## 2016-01-06 LAB — BASIC METABOLIC PANEL
Anion gap: 9 (ref 5–15)
BUN: 29 mg/dL — AB (ref 6–20)
CHLORIDE: 102 mmol/L (ref 101–111)
CO2: 27 mmol/L (ref 22–32)
Calcium: 7.6 mg/dL — ABNORMAL LOW (ref 8.9–10.3)
Creatinine, Ser: 2.55 mg/dL — ABNORMAL HIGH (ref 0.61–1.24)
GFR calc Af Amer: 35 mL/min — ABNORMAL LOW (ref >60)
GFR calc non Af Amer: 30 mL/min — ABNORMAL LOW (ref >60)
GLUCOSE: 102 mg/dL — AB (ref 65–99)
POTASSIUM: 4.9 mmol/L (ref 3.5–5.1)
Sodium: 138 mmol/L (ref 135–145)

## 2016-01-06 LAB — GLUCOSE, CAPILLARY
GLUCOSE-CAPILLARY: 162 mg/dL — AB (ref 65–99)
Glucose-Capillary: 105 mg/dL — ABNORMAL HIGH (ref 65–99)

## 2016-01-06 MED ORDER — OXYCODONE HCL 5 MG PO TABS
5.0000 mg | ORAL_TABLET | ORAL | Status: DC | PRN
  Administered 2016-01-07 – 2016-01-11 (×20): 5 mg via ORAL
  Filled 2016-01-06 (×20): qty 1

## 2016-01-06 MED ORDER — TRAZODONE HCL 150 MG PO TABS
150.0000 mg | ORAL_TABLET | Freq: Every day | ORAL | Status: DC
  Administered 2016-01-06 – 2016-01-10 (×5): 150 mg via ORAL
  Filled 2016-01-06 (×5): qty 1

## 2016-01-06 MED ORDER — SODIUM CHLORIDE 0.9 % IV SOLN
INTRAVENOUS | Status: DC
  Administered 2016-01-06: 75 mL via INTRAVENOUS
  Administered 2016-01-06 – 2016-01-09 (×4): via INTRAVENOUS

## 2016-01-06 NOTE — Progress Notes (Signed)
MD Regalado notified of pt's loose BM's. Order for increase in fluids, but per MD hold off c.diff testing for now. Will continue to monitor.

## 2016-01-06 NOTE — Care Management Note (Signed)
Case Management Note  Patient Details  Name: [name] MRN: [id number] Date of Birth: [birth year]  Subjective/Objective:      Patient is from [facility], NCM spoke with rep there and for the HHPT they will just need this on the [facility] and they will set this up for patient.  Patient conts with chest tubes x 2 to water seal, conts on fentanyl pca.  NCM will cont to follow for dc needs.          Action/Plan:   Expected Discharge Date:                  Expected Discharge Plan:  Assisted Living / Rest Home  In-House Referral:  Clinical Social Work  Discharge planning Services  CM Consult  Post Acute Care Choice:    Choice offered to:     DME Arranged:    DME Agency:     HH Arranged:    HH Agency:     Status of Service:  Completed, signed off  Medicare Important Message Given:  Yes Date Medicare IM Given:    Medicare IM give by:    Date Additional Medicare IM Given:    Additional Medicare Important Message give by:     If discussed at Long Length of Stay Meetings, dates discussed:    Additional Comments:  Leone Haven, RN 01/06/2016, 6:09 PM

## 2016-01-06 NOTE — Progress Notes (Signed)
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 - on abx/fentanyl pca, culture pending , plan per Ct surgery. -Culture 2-11, pleural growing gram positive cocci in cluster.  -on meropenem. Vanc.   2-Post op  anemia: hgb 7.4 on 2/12, s/p prbc x2units on 2/12, hgb stable after transfusion Repeat labs in am.   3-AKI;  ua no infection,  toradol d/ced by CT surgery,  patient has been on vanc and zosyn, discussed with pharmacy, pharmacy to adjust dose. Will start IV fluids.  Urine out put 2.3 L on 2-14  4-Hyponatremia: resolved.   5-Encephalopathy, AMS;  Patient notice to be lethargic 2-14 Will decrease trazodone.  Discussed with nurse will hold klonopin if patient is sedated.  Monitor on fentanyl.   6-H/o HTN,; hold Norvasc. 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  last discharge summary in 10/2015 by psych he was supposed to taper off librium, however meds intake on admission , patient is still on high dose scheduled librium. Librium taper.  -decrease trazodone.  -decrease klonopin.   Diarrhea;  Hold laxatives.   Code Status: full  Family Communication: patient   Disposition Plan: pending, ALF vs SNF.   Consultants:  Ct surgery  Procedures: 2/10: picc line placement for poor IV access. 2/11: Bronchoscopy, right video-assisted thoracoscopy, drainage of empyema, visceral and parietal pleural decortication. 2/12 prbc x2   Antibiotics:  Vanc/zosyn from admission.Marland Kitchenzosyn DC 2-14.   Meropenem: 2-14   Subjective;   Patient is alert, less sedated. Complaining of  pain, chest site chest tube.  Denies dyspnea.  Having multiple BM    Objective: BP 106/50 mmHg  Pulse 68  Temp(Src) 97.4 F (36.3 C) (Oral)  Resp 14  Ht 5\' 9"  (1.753 m)  Wt 77.111 kg (170 lb)  BMI 25.09 kg/m2  SpO2 72%  Intake/Output Summary (Last 24 hours) at 01/06/16 0826 Last data filed at 01/06/16 0745  Gross per 24 hour  Intake 1693.53 ml  Output   2370 ml  Net -676.47 ml   Filed Weights   12/31/15 1843 01/02/16 1630  Weight: 95.709 kg (211 lb) 77.111 kg (170 lb)    Exam:   General:  NAD  Cardiovascular: RRR  Respiratory: right chest post op changes , chest tube with serosanguinous drainage  Abdomen: Soft/ND/NT, positive BS  Musculoskeletal: No Edema  Neuro: aaox3, flat affect  Data Reviewed: Basic Metabolic Panel:  Recent Labs Lab 01/02/16 0346 01/03/16 0512 01/04/16 0500 01/05/16 0430 01/05/16 1850 01/06/16 0520  NA 132* 134* 130* 133* 137 138  K 4.0 4.6 4.1 5.2* 4.9 4.9  CL 92* 95* 90* 94* 99* 102  CO2 29 29 29 28 28 27   GLUCOSE 122* 132* 113* 103* 94 102*  BUN 13 19 31* 35* 33* 29*  CREATININE 0.71 1.13 1.77* 2.43* 2.67* 2.55*  CALCIUM 7.7* 7.4* 7.1* 7.8* 7.6* 7.6*  MG 2.3  --   --   --   --   --    Liver Function Tests:  Recent Labs Lab 01/04/16 0500  AST 87*  ALT 52  ALKPHOS 145*  BILITOT 0.7  PROT 3.9*  ALBUMIN 1.3*   No results  for input(s): LIPASE, AMYLASE in the last 168 hours. No results for input(s): AMMONIA in the last 168 hours. CBC:  Recent Labs Lab 12/31/15 2000  01/02/16 0346 01/03/16 0512 01/04/16 0500 01/05/16 0430 01/06/16 0520  WBC 21.5*  < > 20.8* 20.2* 12.1* 15.8* 11.6*  NEUTROABS 17.2*  --   --   --   --   --   --   HGB 12.3*  < > 11.1* 7.4* 8.6* 9.0* 8.3*  HCT 36.5*  < > 33.9* 22.1* 25.3* 27.1* 25.2*  MCV 87.1  < > 88.5 89.5 87.8 88.3 92.3  PLT 271  < > 250 267 233 277 261  < > = values in this interval not displayed. Cardiac Enzymes:   No results for input(s): CKTOTAL, CKMB, CKMBINDEX,  TROPONINI in the last 168 hours. BNP (last 3 results)  Recent Labs  12/31/15 2000  BNP 62.6    ProBNP (last 3 results) No results for input(s): PROBNP in the last 8760 hours.  CBG:  Recent Labs Lab 01/03/16 0648  GLUCAP 116*    Recent Results (from the past 240 hour(s))  Culture, blood (routine x 2)     Status: None   Collection Time: 12/31/15  9:56 PM  Result Value Ref Range Status   Specimen Description BLOOD LEFT ARM  Final   Special Requests AEROBIC BOTTLE ONLY  Final   Culture NO GROWTH 5 DAYS  Final   Report Status 01/05/2016 FINAL  Final  Culture, blood (routine x 2)     Status: None   Collection Time: 12/31/15 10:01 PM  Result Value Ref Range Status   Specimen Description BLOOD LEFT HAND  Final   Special Requests AEROBIC BOTTLE ONLY  Final   Culture NO GROWTH 5 DAYS  Final   Report Status 01/05/2016 FINAL  Final  Surgical pcr screen     Status: None   Collection Time: 01/02/16  8:07 AM  Result Value Ref Range Status   MRSA, PCR NEGATIVE NEGATIVE Final   Staphylococcus aureus NEGATIVE NEGATIVE Final    Comment:        The Xpert SA Assay (FDA approved for NASAL specimens in patients over 24 years of age), is one component of a comprehensive surveillance program.  Test performance has been validated by [facility] for patients greater than or equal to 75 year old. It is not intended to diagnose infection nor to guide or monitor treatment.   Culture, respiratory (NON-Expectorated)     Status: None   Collection Time: 01/02/16 10:19 AM  Result Value Ref Range Status   Specimen Description BRONCHIAL WASHINGS  Final   Special Requests POF ZOSYN 3.375 PER M.GENGLER ORDER RESP CULTURE  Final   Gram Stain   Final    MODERATE WBC PRESENT, PREDOMINANTLY PMN NO SQUAMOUS EPITHELIAL CELLS SEEN NO ORGANISMS SEEN Performed at [facility]    Culture   Final    NORMAL OROPHARYNGEAL FLORA Performed at [facility]    Report Status  01/05/2016 FINAL  Final  Anaerobic culture     Status: None (Preliminary result)   Collection Time: 01/02/16 11:27 AM  Result Value Ref Range Status   Specimen Description TISSUE RIGHT PLEURAL  Final   Special Requests RIGHT PLEURAL PEEL POF ZOSYN  Final   Gram Stain   Final    ABUNDANT WBC PRESENT,BOTH PMN AND MONONUCLEAR FEW SQUAMOUS EPITHELIAL CELLS PRESENT FEW GRAM POSITIVE COCCI IN CLUSTERS Performed at [facility]  Culture   Final    NO ANAEROBES ISOLATED; CULTURE IN PROGRESS FOR 5 DAYS Performed at [facility]    Report Status PENDING  Incomplete  Tissue culture     Status: None (Preliminary result)   Collection Time: 01/02/16 11:27 AM  Result Value Ref Range Status   Specimen Description TISSUE RIGHT PLEURAL  Final   Special Requests RIGHT PLEU PEEL POF ZOSYN  Final   Gram Stain   Final    ABUNDANT WBC PRESENT,BOTH PMN AND MONONUCLEAR FEW SQUAMOUS EPITHELIAL CELLS PRESENT FEW GRAM POSITIVE COCCI IN CLUSTERS Performed at [facility]    Culture   Final    NO GROWTH 2 DAYS Performed at [facility]    Report Status PENDING  Incomplete  Culture, body fluid-bottle     Status: None (Preliminary result)   Collection Time: 01/02/16 11:47 AM  Result Value Ref Range Status   Specimen Description FLUID RIGHT PLEURAL  Final   Special Requests NONE  Final   Culture NO GROWTH 3 DAYS  Final   Report Status PENDING  Incomplete  Gram stain     Status: None   Collection Time: 01/02/16 11:47 AM  Result Value Ref Range Status   Specimen Description FLUID RIGHT PLEURAL  Final   Special Requests NONE  Final   Gram Stain   Final    FEW WBC PRESENT, PREDOMINANTLY PMN NO ORGANISMS SEEN    Report Status 01/02/2016 FINAL  Final  Anaerobic culture     Status: None (Preliminary result)   Collection Time: 01/02/16 11:57 AM  Result Value Ref Range Status   Specimen Description TISSUE RIGHT  Final   Special Requests RIGHT VISCERAL PEEL POF ZOSYN   Final   Gram Stain   Final    MODERATE WBC PRESENT,BOTH PMN AND MONONUCLEAR NO SQUAMOUS EPITHELIAL CELLS SEEN NO ORGANISMS SEEN Performed at [facility]    Culture   Final    NO ANAEROBES ISOLATED; CULTURE IN PROGRESS FOR 5 DAYS Performed at [facility]    Report Status PENDING  Incomplete  Tissue culture     Status: None (Preliminary result)   Collection Time: 01/02/16 11:57 AM  Result Value Ref Range Status   Specimen Description TISSUE RIGHT  Final   Special Requests RIGHT VISCERAL PEEL POF ZOSYN  Final   Gram Stain   Final    MODERATE WBC PRESENT,BOTH PMN AND MONONUCLEAR NO SQUAMOUS EPITHELIAL CELLS SEEN NO ORGANISMS SEEN Performed at [facility]    Culture   Final    NO GROWTH 2 DAYS Performed at [facility]    Report Status PENDING  Incomplete     Studies: Dg Chest Port 1 View  01/06/2016  CLINICAL DATA:  Follow-up empyema with chest tube drainage EXAM: PORTABLE CHEST 1 VIEW COMPARISON:  Portable chest x-ray of January 05, 2016. FINDINGS: The left lung is adequately inflated and clear. On the right there is persistent increased density in the mid and lower lung. The 2 chest tubes are present and stable. Pleural thickening or pleural fluid extends over the lateral convexity of the aerated right lung. The heart is normal in size. The pulmonary vascularity is not engorged. The right internal jugular venous catheter tip projects over the midportion of the SVC. IMPRESSION: Persistent airspace opacities on the right consistent with edema or pneumonia. A moderate-size residual pleural effusion on the right is suspected though this may be loculated. There is no pneumothorax or pneumomediastinum. The  chest tubes are in stable position. On the left no significant pulmonary parenchymal abnormality is observed. Electronically Signed   By: David  [place] M.D.   On: 01/06/2016 08:01    Scheduled Meds: . acetaminophen  1,000 mg Oral 4 times per day   Or   . acetaminophen (TYLENOL) oral liquid 160 mg/5 mL  1,000 mg Oral 4 times per day  . bisacodyl  10 mg Oral Daily  . buPROPion  300 mg Oral Daily  . clonazePAM  1 mg Oral 2 times per day  . divalproex  2,000 mg Oral QHS  . enoxaparin (LOVENOX) injection  30 mg Subcutaneous Q24H  . escitalopram  20 mg Oral Daily  . fentaNYL   Intravenous 6 times per day  . meropenem (MERREM) IV  1 g Intravenous Q12H  . mometasone-formoterol  2 puff Inhalation BID  . multivitamin with minerals  1 tablet Oral Daily  . pantoprazole  40 mg Oral Q1200  . senna-docusate  1 tablet Oral QHS  . traZODone  300 mg Oral QHS    Continuous Infusions:     Time spent:  Hartley Barefoot A MD. [facility] Pager [phone]. If 7PM-7AM, please contact night-coverage at [email or web address], password [facility] 01/06/2016, 8:26 AM  LOS: 6 days

## 2016-01-06 NOTE — Progress Notes (Signed)
      [address]       [place] [address]             [phone]      4 Days Post-Op Procedure(s) (LRB): VIDEO BRONCHOSCOPY (N/A) VIDEO ASSISTED THORACOSCOPY (VATS)/DECORTICATION (Right) DRAINAGE OF PLEURAL EFFUSION (Right)   Subjective:  [name] is a little drowsy this morning.  Developed some diarrhea overnight.  Objective: Vital signs in last 24 hours: Temp:  [97.4 F (36.3 C)-98.2 F (36.8 C)] 97.4 F (36.3 C) (02/15 0737) Pulse Rate:  [56-77] 68 (02/15 0735) Cardiac Rhythm:  [-] Normal sinus rhythm (02/15 0745) Resp:  [14-20] 14 (02/15 0735) BP: (106-121)/(50-93) 106/50 mmHg (02/15 0735) SpO2:  [72 %-100 %] 72 % (02/15 0813)  Intake/Output from previous day: 02/14 0701 - 02/15 0700 In: 1813.5 [P.O.:1600; I.V.:13.5; IV Piggyback:200] Out: 2370 [Urine:2350; Chest Tube:20] Intake/Output this shift: Total I/O In: 120 [P.O.:120] Out: 20 [Chest Tube:20]  General appearance: cooperative and no distress Heart: regular rate and rhythm Lungs: diminished breath sounds bibasilar Abdomen: soft, non-tender; bowel sounds normal; no masses,  no organomegaly Wound: clean, some bloody drainage around chest tube site  Lab Results:  Recent Labs  01/05/16 0430 01/06/16 0520  WBC 15.8* 11.6*  HGB 9.0* 8.3*  HCT 27.1* 25.2*  PLT 277 261   BMET:  Recent Labs  01/05/16 1850 01/06/16 0520  NA 137 138  K 4.9 4.9  CL 99* 102  CO2 28 27  GLUCOSE 94 102*  BUN 33* 29*  CREATININE 2.67* 2.55*  CALCIUM 7.6* 7.6*    PT/INR: No results for input(s): LABPROT, INR in the last 72 hours. ABG    Component Value Date/Time   PHART 7.377 01/03/2016 0453   HCO3 28.6* 01/03/2016 0453   TCO2 30.2 01/03/2016 0453   O2SAT 96.5 01/03/2016 0453   CBG (last 3)  No results for input(s): GLUCAP in the last 72 hours.  Assessment/Plan: S/P Procedure(s) (LRB): VIDEO BRONCHOSCOPY (N/A) VIDEO ASSISTED THORACOSCOPY (VATS)/DECORTICATION (Right) DRAINAGE OF PLEURAL  EFFUSION (Right)  1. Chest tube- no air leak, minimal output (level currently at 600)- continue all chest tubes today, hopefully can remove one tomorrow 2. ID- Empyema- on Meropenem, cultures show no growth to date, however preliminary initially showed GPC, remains afebrile, leukocytosis improving 3. GI- + diarrhea, will stop stool softners, hold off on C. Diff testing for now 4. Dispo- care per primary, repeat CXR ordered for AM   LOS: 6 days    Raford Pitcher, Denny Peon 01/06/2016

## 2016-01-06 NOTE — Progress Notes (Signed)
ANTIBIOTIC CONSULT NOTE   Pharmacy Consult for Sherol Dade  Indication: Cavitating lung mass  No Known Allergies  Patient Measurements: Height:  (175.3 cm) Weight: 170 lb (77.111 kg) IBW/kg (Calculated) : 70.7 Adjusted Body Weight:   Vital Signs: Temp: 97.4 F (36.3 C) (02/15 0737) Temp Source: Oral (02/15 0737) BP: 106/50 mmHg (02/15 0735) Pulse Rate: 68 (02/15 0735) Intake/Output from previous day: 02/14 0701 - 02/15 0700 In: 1813.5 [P.O.:1600; I.V.:13.5; IV Piggyback:200] Out: 2370 [Urine:2350; Chest Tube:20] Intake/Output from this shift: Total I/O In: 120 [P.O.:120] Out: 20 [Chest Tube:20]  Labs:  Recent Labs  01/04/16 0500 01/05/16 0430 01/05/16 1850 01/06/16 0520  WBC 12.1* 15.8*  --  11.6*  HGB 8.6* 9.0*  --  8.3*  PLT 233 277  --  261  CREATININE 1.77* 2.43* 2.67* 2.55*   Estimated Creatinine Clearance: 38.5 mL/min (by C-G formula based on Cr of 2.55).  Recent Labs  01/05/16 0430 01/06/16 0520  VANCORANDOM 31 17     Medical History: Past Medical History  Diagnosis Date  . Depression   . Anxiety   . Hypertension   . GERD (gastroesophageal reflux disease)   . Seizures (HCC)     Assessment: ZO:[id number] mass in right middle lung lobe. Right middle lobe abscess vs neoplasm and R pleural effusion (parapneumonic vs empyema vs malignant). WBC 21.5 > 15.8>11.76. Afeb. - R VATS 2/11  Vanco 2/9 >>  Meropenem 2/14 >> Zosyn 2/9 >>2/14  2/10 VT: 86 (thought to be lab error) 2/13 VR: 41   2/14 VR: 31 2/15: VR: 17  2/9: BC x 2: negative 2/11 tissue cx: GPC 2/11 brochial wash cx: normal flora 2/09 MRSA screen neg  Goal of Therapy:  Vancomycin trough level 15-20 mcg/ml  Plan:  -Meropenem 1 g IV q12h (can lower the seizure threshold) -Vancomycin 1g IV q48h. Random level Fri AM.   Tiffannie Sloss S. Merilynn Finland, PharmD, BCPS Clinical Staff Pharmacist Pager [phone]  Misty Stanley Stillinger 01/06/2016,8:41 AM

## 2016-01-06 NOTE — Progress Notes (Signed)
Physical Therapy Treatment Patient Details Name: [name] MRN: [id number] DOB: 1976-08-27 Today's Date: 01/06/2016    History of Present Illness Patient is a 40 y/o male admitted with Cavitating mass in right middle lung lobe. 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  Now s/p Bronchoscopy w/ R VATS partial pleural decortication on 01/02/16.    PT Comments    [name] ambulated 300 ft w/ min guard assist and cues to ambulate in the middle of the hallway as he was drifting to the Lt.  SpO2 down to 83% while ambulating on RA but it quickly returns to the 90's at rest.  Pt will benefit from continued skilled PT services to increase functional independence and safety.   Follow Up Recommendations  Home health PT;Other (comment) [name] aide)     Equipment Recommendations  None recommended by PT    Recommendations for Other Services       Precautions / Restrictions Precautions Precautions: Fall Precaution Comments: Chest tube to water seal Restrictions Weight Bearing Restrictions: No    Mobility  Bed Mobility               General bed mobility comments: Pt sitting EOB upon PT arrival  Transfers Overall transfer level: Needs assistance Equipment used: Rolling walker (2 wheeled) Transfers: Sit to/from Stand Sit to Stand: Min guard         General transfer comment: Cues for hand placement.  Close min guard assist due to pt's min instability  Ambulation/Gait Ambulation/Gait assistance: Min guard Ambulation Distance (Feet): 300 Feet Assistive device: Rolling walker (2 wheeled) Gait Pattern/deviations: Step-through pattern;Decreased stride length;Drifts right/left;Trunk flexed;Antalgic   Gait velocity interpretation: Below normal speed for age/gender General Gait Details: SpO2 lowest at 83% on RA while ambulating but returns to 90's at rest.  Pt drifts to the Lt and requries verbal cues to ambulate in the middle of the hallway.     Stairs            Wheelchair Mobility    Modified Rankin (Stroke Patients Only)       Balance Overall balance assessment: Needs assistance Sitting-balance support: Feet supported;No upper extremity supported Sitting balance-Leahy Scale: Good     Standing balance support: Bilateral upper extremity supported;During functional activity Standing balance-Leahy Scale: Fair                      Cognition Arousal/Alertness: Awake/alert Behavior During Therapy: Flat affect Overall Cognitive Status: No family/caregiver present to determine baseline cognitive functioning                      Exercises General Exercises - Lower Extremity Ankle Circles/Pumps: AROM;Both;10 reps;Seated Long Arc Quad: AROM;Both;10 reps;Seated Hip Flexion/Marching: AROM;Both;10 reps;Seated    General Comments        Pertinent Vitals/Pain Pain Assessment: 0-10 Pain Score: 6  Pain Location: Rt flank at surgical site Pain Descriptors / Indicators: Discomfort;Grimacing;Moaning;Aching Pain Intervention(s): Limited activity within patient's tolerance;Monitored during session;Repositioned    Home Living                      Prior Function            PT Goals (current goals can now be found in the care plan section) Acute Rehab PT Goals Patient Stated Goal: decreased pain PT Goal Formulation: With patient Time For Goal Achievement: 01/11/16 Potential to Achieve Goals: Good Progress towards PT goals: Progressing toward goals  Frequency  Min 3X/week    PT Plan Current plan remains appropriate    Co-evaluation             End of Session Equipment Utilized During Treatment: Gait belt Activity Tolerance: Patient limited by pain;Patient tolerated treatment well Patient left: in chair;with call bell/phone within reach;with chair alarm set     Time: 1610-9604 PT Time Calculation (min) (ACUTE ONLY): 39 min  Charges:  $Gait Training: 23-37 mins $Therapeutic  Exercise: 8-22 mins                    G Codes:      Michail Jewels PT, [place] [phone] Pager: [phone] 01/06/2016, 4:25 PM

## 2016-01-07 ENCOUNTER — Inpatient Hospital Stay (HOSPITAL_COMMUNITY): Payer: Medicare Other

## 2016-01-07 DIAGNOSIS — G9340 Encephalopathy, unspecified: Secondary | ICD-10-CM

## 2016-01-07 LAB — BASIC METABOLIC PANEL
Anion gap: 7 (ref 5–15)
BUN: 24 mg/dL — AB (ref 6–20)
CO2: 27 mmol/L (ref 22–32)
CREATININE: 2.27 mg/dL — AB (ref 0.61–1.24)
Calcium: 7.7 mg/dL — ABNORMAL LOW (ref 8.9–10.3)
Chloride: 102 mmol/L (ref 101–111)
GFR calc Af Amer: 40 mL/min — ABNORMAL LOW (ref >60)
GFR, EST NON AFRICAN AMERICAN: 34 mL/min — AB (ref >60)
GLUCOSE: 90 mg/dL (ref 65–99)
POTASSIUM: 5 mmol/L (ref 3.5–5.1)
SODIUM: 136 mmol/L (ref 135–145)

## 2016-01-07 LAB — ANAEROBIC CULTURE

## 2016-01-07 LAB — CBC
HCT: 25.2 % — ABNORMAL LOW (ref 39.0–52.0)
Hemoglobin: 8.1 g/dL — ABNORMAL LOW (ref 13.0–17.0)
MCH: 29.3 pg (ref 26.0–34.0)
MCHC: 32.1 g/dL (ref 30.0–36.0)
MCV: 91.3 fL (ref 78.0–100.0)
PLATELETS: 270 10*3/uL (ref 150–400)
RBC: 2.76 MIL/uL — AB (ref 4.22–5.81)
RDW: 16.3 % — ABNORMAL HIGH (ref 11.5–15.5)
WBC: 10 10*3/uL (ref 4.0–10.5)

## 2016-01-07 LAB — GLUCOSE, CAPILLARY: Glucose-Capillary: 108 mg/dL — ABNORMAL HIGH (ref 65–99)

## 2016-01-07 LAB — CULTURE, BODY FLUID-BOTTLE: CULTURE: NO GROWTH

## 2016-01-07 LAB — CULTURE, BODY FLUID W GRAM STAIN -BOTTLE

## 2016-01-07 MED ORDER — VANCOMYCIN HCL IN DEXTROSE 750-5 MG/150ML-% IV SOLN
750.0000 mg | Freq: Two times a day (BID) | INTRAVENOUS | Status: DC
  Administered 2016-01-07 – 2016-01-09 (×4): 750 mg via INTRAVENOUS
  Filled 2016-01-07 (×5): qty 150

## 2016-01-07 MED ORDER — ENOXAPARIN SODIUM 40 MG/0.4ML ~~LOC~~ SOLN
40.0000 mg | SUBCUTANEOUS | Status: DC
  Administered 2016-01-08 – 2016-01-11 (×4): 40 mg via SUBCUTANEOUS
  Filled 2016-01-07 (×3): qty 0.4

## 2016-01-07 NOTE — Progress Notes (Signed)
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 - on abx/fentanyl pca, culture pending , plan per Ct surgery. -Culture 2-11, pleural grew Microaerophilic streptococcus.  -on meropenem. Vanc.  -Anaerobic from pleural tissue 2-11; few gram positive cluster.   2-Post op  anemia: hgb 7.4 on 2/12, s/p prbc x2units on 2/12, hgb stable after transfusion Hb stable.   3-AKI;  ua no infection,  Agree with discontinuation of Toradol.  patient has been on vanc and zosyn, discussed with pharmacy, pharmacy to adjust dose. Continue with IV fluids.  Urine out put 2.3 L on 2-14 Cr trending down 2.6----2.2.  4-Hyponatremia: resolved.   5-Encephalopathy, AMS;  Patient notice to be lethargic 2-14 Doing better on  decrease trazodone.  Discussed with nurse will hold klonopin if patient is sedated.  Monitor on fentanyl.  Improved.   6-H/o HTN,; hold Norvasc. 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  last discharge summary in 10/2015 by psych he was supposed to taper off librium, however meds intake on admission , patient is still on high dose scheduled librium. Librium taper.  -decrease trazodone.  -decrease klonopin.   Diarrhea;  Hold laxatives.   Code Status: full  Family Communication: patient   Disposition Plan: pending, ALF vs SNF.   Consultants:  Ct surgery  Procedures: 2/10: picc line placement for poor IV access. 2/11: Bronchoscopy, right video-assisted thoracoscopy, drainage of empyema, visceral and parietal pleural  decortication. 2/12 prbc x2   Antibiotics:  Vanc/zosyn from admission.Marland Kitchenzosyn DC 2-14.   Meropenem: 2-14   Subjective;   Patient is alert, less sedated.  Diarrhea improving, has had only one bowel movement today    Objective: BP 110/51 mmHg  Pulse 65  Temp(Src) 98.2 F (36.8 C) (Oral)  Resp 17  Ht  (1.753 m)  Wt 77.111 kg (170 lb)  BMI 25.09 kg/m2  SpO2 93%  Intake/Output Summary (Last 24 hours) at 01/07/16 0812 Last data filed at 01/07/16 0700  Gross per 24 hour  Intake 2416.25 ml  Output   2061 ml  Net 355.25 ml   Filed Weights   12/31/15 1843 01/02/16 1630  Weight: 95.709 kg (211 lb) 77.111 kg (170 lb)    Exam:   General:  NAD  Cardiovascular: RRR  Respiratory: right chest post op changes , chest tube with serosanguinous drainage  Abdomen: Soft/ND/NT, positive BS  Musculoskeletal: No Edema  Neuro: aaox3, flat affect  Data Reviewed: Basic Metabolic Panel:  Recent Labs Lab 01/02/16 0346  01/04/16 0500 01/05/16 0430 01/05/16 1850 01/06/16 0520 01/07/16 0509  NA 132*  < > 130* 133* 137 138 136  K 4.0  < > 4.1 5.2* 4.9 4.9 5.0  CL 92*  < > 90* 94* 99* 102 102  CO2 29  < > GLUCOSE 122*  < > 113* 103* 94 102* 90  BUN 13  < > 31* 35* 33* 29* 24*  CREATININE 0.71  < > 1.77* 2.43* 2.67* 2.55* 2.27*  CALCIUM 7.7*  < > 7.1* 7.8* 7.6* 7.6* 7.7*  MG 2.3  --   --   --   --   --   --   < > =  values in this interval not displayed. Liver Function Tests:  Recent Labs Lab 01/04/16 0500  AST 87*  ALT 52  ALKPHOS 145*  BILITOT 0.7  PROT 3.9*  ALBUMIN 1.3*   No results for input(s): LIPASE, AMYLASE in the last 168 hours. No results for input(s): AMMONIA in the last 168 hours. CBC:  Recent Labs Lab 12/31/15 2000  01/03/16 0512 01/04/16 0500 01/05/16 0430 01/06/16 0520 01/07/16 0509  WBC 21.5*  < > 20.2* 12.1* 15.8* 11.6* 10.0  NEUTROABS 17.2*  --   --   --   --   --   --   HGB 12.3*  < > 7.4* 8.6* 9.0* 8.3* 8.1*  HCT  36.5*  < > 22.1* 25.3* 27.1* 25.2* 25.2*  MCV 87.1  < > 89.5 87.8 88.3 92.3 91.3  PLT 271  < > 267 233 277 261 270  < > = values in this interval not displayed. Cardiac Enzymes:   No results for input(s): CKTOTAL, CKMB, CKMBINDEX, TROPONINI in the last 168 hours. BNP (last 3 results)  Recent Labs  12/31/15 2000  BNP 62.6    ProBNP (last 3 results) No results for input(s): PROBNP in the last 8760 hours.  CBG:  Recent Labs Lab 01/02/16 1724 01/02/16 2341 01/03/16 0648  GLUCAP 105* 162* 116*    Recent Results (from the past 240 hour(s))  Culture, blood (routine x 2)     Status: None   Collection Time: 12/31/15  9:56 PM  Result Value Ref Range Status   Specimen Description BLOOD LEFT ARM  Final   Special Requests AEROBIC BOTTLE ONLY  Final   Culture NO GROWTH 5 DAYS  Final   Report Status 01/05/2016 FINAL  Final  Culture, blood (routine x 2)     Status: None   Collection Time: 12/31/15 10:01 PM  Result Value Ref Range Status   Specimen Description BLOOD LEFT HAND  Final   Special Requests AEROBIC BOTTLE ONLY  Final   Culture NO GROWTH 5 DAYS  Final   Report Status 01/05/2016 FINAL  Final  Surgical pcr screen     Status: None   Collection Time: 01/02/16  8:07 AM  Result Value Ref Range Status   MRSA, PCR NEGATIVE NEGATIVE Final   Staphylococcus aureus NEGATIVE NEGATIVE Final    Comment:        The Xpert SA Assay (FDA approved for NASAL specimens in patients over 39 years of age), is one component of a comprehensive surveillance program.  Test performance has been validated by [facility] for patients greater than or equal to 30 year old. It is not intended to diagnose infection nor to guide or monitor treatment.   Culture, respiratory (NON-Expectorated)     Status: None   Collection Time: 01/02/16 10:19 AM  Result Value Ref Range Status   Specimen Description BRONCHIAL WASHINGS  Final   Special Requests POF ZOSYN 3.375 PER M.GENGLER ORDER RESP  CULTURE  Final   Gram Stain   Final    MODERATE WBC PRESENT, PREDOMINANTLY PMN NO SQUAMOUS EPITHELIAL CELLS SEEN NO ORGANISMS SEEN Performed at [facility]    Culture   Final    NORMAL OROPHARYNGEAL FLORA Performed at [facility]    Report Status 01/05/2016 FINAL  Final  Anaerobic culture     Status: None (Preliminary result)   Collection Time: 01/02/16 11:27 AM  Result Value Ref Range Status   Specimen Description TISSUE RIGHT PLEURAL  Final  Special Requests RIGHT PLEURAL PEEL POF ZOSYN  Final   Gram Stain   Final    ABUNDANT WBC PRESENT,BOTH PMN AND MONONUCLEAR FEW SQUAMOUS EPITHELIAL CELLS PRESENT FEW GRAM POSITIVE COCCI IN CLUSTERS Performed at [facility]    Culture   Final    NO ANAEROBES ISOLATED; CULTURE IN PROGRESS FOR 5 DAYS Performed at [facility]    Report Status PENDING  Incomplete  Tissue culture     Status: None   Collection Time: 01/02/16 11:27 AM  Result Value Ref Range Status   Specimen Description TISSUE RIGHT PLEURAL  Final   Special Requests RIGHT PLEU PEEL POF ZOSYN  Final   Gram Stain   Final    ABUNDANT WBC PRESENT,BOTH PMN AND MONONUCLEAR FEW SQUAMOUS EPITHELIAL CELLS PRESENT FEW GRAM POSITIVE COCCI IN CLUSTERS Performed at [facility]    Culture   Final    ABUNDANT MICROAEROPHILIC STREPTOCOCCI Note: Standardized susceptibility testing for this organism is not available. Performed at [facility]    Report Status 01/06/2016 FINAL  Final  Culture, body fluid-bottle     Status: None (Preliminary result)   Collection Time: 01/02/16 11:47 AM  Result Value Ref Range Status   Specimen Description FLUID RIGHT PLEURAL  Final   Special Requests NONE  Final   Culture NO GROWTH 4 DAYS  Final   Report Status PENDING  Incomplete  Gram stain     Status: None   Collection Time: 01/02/16 11:47 AM  Result Value Ref Range Status   Specimen Description FLUID RIGHT PLEURAL  Final   Special Requests  NONE  Final   Gram Stain   Final    FEW WBC PRESENT, PREDOMINANTLY PMN NO ORGANISMS SEEN    Report Status 01/02/2016 FINAL  Final  Anaerobic culture     Status: None (Preliminary result)   Collection Time: 01/02/16 11:57 AM  Result Value Ref Range Status   Specimen Description TISSUE RIGHT  Final   Special Requests RIGHT VISCERAL PEEL POF ZOSYN  Final   Gram Stain   Final    MODERATE WBC PRESENT,BOTH PMN AND MONONUCLEAR NO SQUAMOUS EPITHELIAL CELLS SEEN NO ORGANISMS SEEN Performed at [facility]    Culture   Final    NO ANAEROBES ISOLATED; CULTURE IN PROGRESS FOR 5 DAYS Performed at [facility]    Report Status PENDING  Incomplete  Tissue culture     Status: None   Collection Time: 01/02/16 11:57 AM  Result Value Ref Range Status   Specimen Description TISSUE RIGHT  Final   Special Requests RIGHT VISCERAL PEEL POF ZOSYN  Final   Gram Stain   Final    MODERATE WBC PRESENT,BOTH PMN AND MONONUCLEAR NO SQUAMOUS EPITHELIAL CELLS SEEN NO ORGANISMS SEEN Performed at [facility]    Culture   Final    FEW MICROAEROPHILIC STREPTOCOCCI Note: Standardized susceptibility testing for this organism is not available. Performed at [facility]    Report Status 01/06/2016 FINAL  Final     Studies: No results found.  Scheduled Meds: . acetaminophen  1,000 mg Oral 4 times per day   Or  . acetaminophen (TYLENOL) oral liquid 160 mg/5 mL  1,000 mg Oral 4 times per day  . buPROPion  300 mg Oral Daily  . clonazePAM  1 mg Oral 2 times per day  . divalproex  2,000 mg Oral QHS  . enoxaparin (LOVENOX) injection  30 mg Subcutaneous Q24H  . escitalopram  20 mg Oral Daily  . fentaNYL   Intravenous 6 times per day  . meropenem (MERREM) IV  1 g Intravenous Q12H  . mometasone-formoterol  2 puff Inhalation BID  . multivitamin with minerals  1 tablet Oral Daily  . pantoprazole  40 mg Oral Q1200  . traZODone  150 mg Oral QHS    Continuous Infusions: . sodium  chloride 100 mL/hr at 01/06/16 2300     Time spent:  Hartley Barefoot A MD. [facility] Pager [phone]. If 7PM-7AM, please contact night-coverage at [email or web address], password [facility] 01/07/2016, 8:12 AM  LOS: 7 days

## 2016-01-07 NOTE — Progress Notes (Signed)
      [address]       [place] [address]             [phone]      5 Days Post-Op Procedure(s) (LRB): VIDEO BRONCHOSCOPY (N/A) VIDEO ASSISTED THORACOSCOPY (VATS)/DECORTICATION (Right) DRAINAGE OF PLEURAL EFFUSION (Right)   Subjective:  No new complaints.  Some pain at chest tube site.  Continues to have some diarrhea.  Not as drowsy today.  Objective: Vital signs in last 24 hours: Temp:  [97.4 F (36.3 C)-98.7 F (37.1 C)] 98.2 F (36.8 C) (02/16 0739) Pulse Rate:  [61-67] 65 (02/16 0739) Cardiac Rhythm:  [-] Normal sinus rhythm (02/16 0749) Resp:  [15-24] 17 (02/16 0748) BP: (103-123)/(51-95) 110/51 mmHg (02/16 0739) SpO2:  [92 %-99 %] 93 % (02/16 0748)  Intake/Output from previous day: 02/15 0701 - 02/16 0700 In: 2536.3 [P.O.:1080; I.V.:1256.3; IV Piggyback:200] Out: 2081 [Urine:1800; Stool:1; Chest Tube:280]  General appearance: alert, cooperative and no distress Heart: regular rate and rhythm Lungs: diminished breath sounds bibasilar Abdomen: soft, non-tender; bowel sounds normal; no masses,  no organomegaly Wound: clean, some blood drainage around posterior chest tube  Lab Results:  Recent Labs  01/06/16 0520 01/07/16 0509  WBC 11.6* 10.0  HGB 8.3* 8.1*  HCT 25.2* 25.2*  PLT 261 270   BMET:  Recent Labs  01/06/16 0520 01/07/16 0509  NA 138 136  K 4.9 5.0  CL 102 102  CO2 27 27  GLUCOSE 102* 90  BUN 29* 24*  CREATININE 2.55* 2.27*  CALCIUM 7.6* 7.7*    PT/INR: No results for input(s): LABPROT, INR in the last 72 hours. ABG    Component Value Date/Time   PHART 7.377 01/03/2016 0453   HCO3 28.6* 01/03/2016 0453   TCO2 30.2 01/03/2016 0453   O2SAT 96.5 01/03/2016 0453   CBG (last 3)  No results for input(s): GLUCAP in the last 72 hours.  Assessment/Plan: S/P Procedure(s) (LRB): VIDEO BRONCHOSCOPY (N/A) VIDEO ASSISTED THORACOSCOPY (VATS)/DECORTICATION (Right) DRAINAGE OF PLEURAL EFFUSION (Right)  1. Chest  tube-250 cc output yesterday ( level currently at 850)- no air leak, will d/c posterior chest tube today 2. GI- diarrhea, no fever, no leukocytosis, stool softners have been discontinued, no C. Diff needed at this time 3. ID- Empyema, tissue culture showing Microaerophilic strep, on Meropenem, remains afebrile 4. Dispo- d/c posterior chest tube, tissue culture positive for Microaerophilic strep continue ABX, care per primary   LOS: 7 days    Arlyce Circle 01/07/2016

## 2016-01-07 NOTE — Progress Notes (Signed)
PCA pump stopped reading the syringe-2 ml's left in syringe-wasted with Elijah Birk, RN. Replaced syringe with new one. Will continue to monitor.

## 2016-01-07 NOTE — Progress Notes (Signed)
eLink Pharmacist-Brief Progress Note Patient Name: [name] DOB: 02-07-1976 MRN: [id number]   Date of Service  01/07/2016  HPI/Events of Note  Meds reviewed. Pt on DVT prophylaxis. CrCl > 30, wt >45kg  eICU Interventions  Inc Lovenox to 40 mg q24h   eLink Provider: Betha Loa, PharmD 01/07/2016, 3:48 PM

## 2016-01-08 ENCOUNTER — Inpatient Hospital Stay (HOSPITAL_COMMUNITY): Payer: Medicare Other

## 2016-01-08 DIAGNOSIS — N179 Acute kidney failure, unspecified: Secondary | ICD-10-CM

## 2016-01-08 LAB — BASIC METABOLIC PANEL
Anion gap: 8 (ref 5–15)
BUN: 23 mg/dL — AB (ref 6–20)
CALCIUM: 7.7 mg/dL — AB (ref 8.9–10.3)
CO2: 25 mmol/L (ref 22–32)
CREATININE: 2.21 mg/dL — AB (ref 0.61–1.24)
Chloride: 101 mmol/L (ref 101–111)
GFR calc Af Amer: 41 mL/min — ABNORMAL LOW (ref >60)
GFR calc non Af Amer: 35 mL/min — ABNORMAL LOW (ref >60)
GLUCOSE: 120 mg/dL — AB (ref 65–99)
Potassium: 5.1 mmol/L (ref 3.5–5.1)
Sodium: 134 mmol/L — ABNORMAL LOW (ref 135–145)

## 2016-01-08 MED ORDER — FENTANYL CITRATE (PF) 100 MCG/2ML IJ SOLN: 25.0000 ug | INTRAMUSCULAR | Status: DC | PRN

## 2016-01-08 NOTE — Progress Notes (Signed)
19 ml's of Fentanyl wasted from PCA syringe with Raymon Mutton as second RN to verify.

## 2016-01-08 NOTE — Care Management Important Message (Signed)
Important Message  Patient Details  Name: [name] MRN: [id number] Date of Birth: Nov 13, 1976   Medicare Important Message Given:  Yes    Kyla Balzarine 01/08/2016, 11:50 AM

## 2016-01-08 NOTE — Progress Notes (Signed)
PT Cancellation Note  Patient Details Name: [name] MRN: [id number] DOB: 02-03-1976   Cancelled Treatment:    Reason Eval/Treat Not Completed: Pain limiting ability to participate. Rx attempted x 2. Pt reporting inability to participate due to increased pain. Pt reporting pain as 7/10. Encouraged pt to use PCA. RN notified. Pt started to become agitated when educated on importance of mobility and provided encouragement to participate.   Ilda Foil 01/08/2016, 10:20 AM

## 2016-01-08 NOTE — Care Management Note (Addendum)
Case Management Note  Patient Details  Name: [name] MRN: [id number] Date of Birth: 04-10-1976  Subjective/Objective:     Patient is resident of [facility],  They work with [employer] (which is now [place]), referral given to [place] [facility] for [facility],  HHPT and aide.  Will need to put this on the FL2.  NCM will cont to follow for dc needs.  Patient may have to have a picc with iv abx, informed Abby with [facility], cx's showed  positve strep.  NCM will cont to follow for dc needs.   Will need HH orders.     Action/Plan:   Expected Discharge Date:                  Expected Discharge Plan:  Assisted Living / Rest Home  In-House Referral:  Clinical Social Work  Discharge planning Services  CM Consult  Post Acute Care Choice:    Choice offered to:     DME Arranged:    DME Agency:     HH Arranged:    HH Agency:     Status of Service:  Completed, signed off  Medicare Important Message Given:  Yes Date Medicare IM Given:    Medicare IM give by:    Date Additional Medicare IM Given:    Additional Medicare Important Message give by:     If discussed at Long Length of Stay Meetings, dates discussed:    Additional Comments:  Leone Haven, RN 01/08/2016, 3:06 PM

## 2016-01-08 NOTE — Progress Notes (Signed)
6 Days Post-Op Procedure(s) (LRB): VIDEO BRONCHOSCOPY (N/A) VIDEO ASSISTED THORACOSCOPY (VATS)/DECORTICATION (Right) DRAINAGE OF PLEURAL EFFUSION (Right) Subjective: Some pain from chest tube, hurts when he lies on right side  Objective: Vital signs in last 24 hours: Temp:  [97.4 F (36.3 C)-99.3 F (37.4 C)] 98 F (36.7 C) (02/17 0724) Pulse Rate:  [63-76] 76 (02/17 0300) Cardiac Rhythm:  [-] Normal sinus rhythm (02/17 0700) Resp:  [15-22] 15 (02/17 0315) BP: (102-112)/(55-79) 112/70 mmHg (02/17 0300) SpO2:  [93 %-98 %] 96 % (02/17 0756)  Hemodynamic parameters for last 24 hours:    Intake/Output from previous day: 02/16 0701 - 02/17 0700 In: 4838.3 [P.O.:2040; I.V.:2298.3; IV Piggyback:500] Out: 955 [Urine:950; Chest Tube:5] Intake/Output this shift:    General appearance: alert and cooperative Neurologic: intact Heart: regular rate and rhythm Lungs: diminished breath sounds right base Abdomen: normal findings: soft, non-tender no air leak  Lab Results:  Recent Labs  01/06/16 0520 01/07/16 0509  WBC 11.6* 10.0  HGB 8.3* 8.1*  HCT 25.2* 25.2*  PLT 261 270   BMET:  Recent Labs  01/07/16 0509 01/08/16 0330  NA 136 134*  K 5.0 5.1  CL 102 101  CO2 27 25  GLUCOSE 90 120*  BUN 24* 23*  CREATININE 2.27* 2.21*  CALCIUM 7.7* 7.7*    PT/INR: No results for input(s): LABPROT, INR in the last 72 hours. ABG    Component Value Date/Time   PHART 7.377 01/03/2016 0453   HCO3 28.6* 01/03/2016 0453   TCO2 30.2 01/03/2016 0453   O2SAT 96.5 01/03/2016 0453   CBG (last 3)   Recent Labs  01/07/16 0733  GLUCAP 108*    Assessment/Plan: S/P Procedure(s) (LRB): VIDEO BRONCHOSCOPY (N/A) VIDEO ASSISTED THORACOSCOPY (VATS)/DECORTICATION (Right) DRAINAGE OF PLEURAL EFFUSION (Right) -  No air leak and minimal drainage. CXR stable  Dc chest tube  Continue antibiotics for pneumonia/ lung abscess/ empyema- currently on vanco and merepenem   LOS: 8 days     Loreli Slot 01/08/2016

## 2016-01-08 NOTE — Progress Notes (Signed)
PROGRESS NOTE  [name] WUJ:[id number] DOB: 22-Oct-1976 DOA: 12/31/2015 PCP: Pcp Not In System  Assessment/Plan: Principal Problem:   Pleural effusion, right Active Problems:   Cavitating mass in right middle lung lobe   AKI (acute kidney injury) (HCC)   Acute encephalopathy   1-Right middle lobe abscess with right empyema: s/p Bronchoscopy, right video-assisted thoracoscopy, drainage of empyema, visceral and parietal pleural decortication on 2/12. - on abx/fentanyl pca, culture pending , plan per Ct surgery. -Culture 2-11, pleural grew Microaerophilic streptococcus.  -on meropenem. Vanc.  -Anaerobic from pleural tissue 2-11; few gram positive cluster.  -Will discontinue fentanyl PCA  2-Post op  anemia: hgb 7.4 on 2/12, s/p prbc x2units on 2/12, hgb stable after transfusion Hb stable.   3-AKI;  ua no infection,  Agree with discontinuation of Toradol.  patient has been on vanc and zosyn, discussed with pharmacy, pharmacy to adjust dose. Continue with IV fluids, decrease rate.   Urine out put 2.3 L on 2-14 Cr trending down 2.6----2.2.--2.2  4-Hyponatremia: resolved.   5-Encephalopathy, AMS;  Patient notice to be lethargic 2-14 Doing better on  decrease trazodone.  Discussed with nurse will hold klonopin if patient is sedated.  Monitor on fentanyl.  Improved.   6-H/o HTN,; hold Norvasc. 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  last discharge summary in 10/2015 by psych he was supposed to taper off librium, however meds intake on admission , patient is still on high dose scheduled librium. Librium taper.  -decrease trazodone.  -decrease klonopin.   Diarrhea;  Hold laxatives.   Code Status: full  Family Communication: patient   Disposition Plan: pending, ALF vs SNF.   Consultants:  Ct surgery  Procedures: 2/10: picc line placement for poor IV access. 2/11: Bronchoscopy, right video-assisted thoracoscopy,  drainage of empyema, visceral and parietal pleural decortication. 2/12 prbc x2   Antibiotics:  Vanc/zosyn from admission.Marland Kitchenzosyn DC 2-14.   Meropenem: 2-14   Subjective;  Sleepy, taking a nap. Denies worsening dyspnea.    Objective: BP 103/66 mmHg  Pulse 78  Temp(Src) 98 F (36.7 C) (Oral)  Resp 24  Ht 5\' 9"  (1.753 m)  Wt 77.111 kg (170 lb)  BMI 25.09 kg/m2  SpO2 94%  Intake/Output Summary (Last 24 hours) at 01/08/16 1631 Last data filed at 01/08/16 1509  Gross per 24 hour  Intake 4228.33 ml  Output   2565 ml  Net 1663.33 ml   Filed Weights   12/31/15 1843 01/02/16 1630  Weight: 95.709 kg (211 lb) 77.111 kg (170 lb)    Exam:   General:  NAD  Cardiovascular: RRR  Respiratory: right chest post op changes , chest tube with serosanguinous drainage  Abdomen: Soft/ND/NT, positive BS  Musculoskeletal: No Edema  Neuro: aaox3, flat affect  Data Reviewed: Basic Metabolic Panel:  Recent Labs Lab 01/02/16 0346  01/05/16 0430 01/05/16 1850 01/06/16 0520 01/07/16 0509 01/08/16 0330  NA 132*  < > 133* 137 138 136 134*  K 4.0  < > 5.2* 4.9 4.9 5.0 5.1  CL 92*  < > 94* 99* 102 102 101  CO2 29  < > 28 28 27 27 25   GLUCOSE 122*  < > 103* 94 102* 90 120*  BUN 13  < > 35* 33* 29* 24* 23*  CREATININE 0.71  < > 2.43* 2.67* 2.55* 2.27* 2.21*  CALCIUM 7.7*  < > 7.8* 7.6* 7.6* 7.7* 7.7*  MG 2.3  --   --   --   --   --   --   < > =  values in this interval not displayed. Liver Function Tests:  Recent Labs Lab 01/04/16 0500  AST 87*  ALT 52  ALKPHOS 145*  BILITOT 0.7  PROT 3.9*  ALBUMIN 1.3*   No results for input(s): LIPASE, AMYLASE in the last 168 hours. No results for input(s): AMMONIA in the last 168 hours. CBC:  Recent Labs Lab 01/03/16 0512 01/04/16 0500 01/05/16 0430 01/06/16 0520 01/07/16 0509  WBC 20.2* 12.1* 15.8* 11.6* 10.0  HGB 7.4* 8.6* 9.0* 8.3* 8.1*  HCT 22.1* 25.3* 27.1* 25.2* 25.2*  MCV 89.5 87.8 88.3 92.3 91.3  PLT 267 233 277  261 270   Cardiac Enzymes:   No results for input(s): CKTOTAL, CKMB, CKMBINDEX, TROPONINI in the last 168 hours. BNP (last 3 results)  Recent Labs  12/31/15 2000  BNP 62.6    ProBNP (last 3 results) No results for input(s): PROBNP in the last 8760 hours.  CBG:  Recent Labs Lab 01/02/16 1724 01/02/16 2341 01/03/16 0648 01/07/16 0733  GLUCAP 105* 162* 116* 108*    Recent Results (from the past 240 hour(s))  Culture, blood (routine x 2)     Status: None   Collection Time: 12/31/15  9:56 PM  Result Value Ref Range Status   Specimen Description BLOOD LEFT ARM  Final   Special Requests AEROBIC BOTTLE ONLY  Final   Culture NO GROWTH 5 DAYS  Final   Report Status 01/05/2016 FINAL  Final  Culture, blood (routine x 2)     Status: None   Collection Time: 12/31/15 10:01 PM  Result Value Ref Range Status   Specimen Description BLOOD LEFT HAND  Final   Special Requests AEROBIC BOTTLE ONLY  Final   Culture NO GROWTH 5 DAYS  Final   Report Status 01/05/2016 FINAL  Final  Surgical pcr screen     Status: None   Collection Time: 01/02/16  8:07 AM  Result Value Ref Range Status   MRSA, PCR NEGATIVE NEGATIVE Final   Staphylococcus aureus NEGATIVE NEGATIVE Final    Comment:        The Xpert SA Assay (FDA approved for NASAL specimens in patients over 54 years of age), is one component of a comprehensive surveillance program.  Test performance has been validated by [facility] for patients greater than or equal to 28 year old. It is not intended to diagnose infection nor to guide or monitor treatment.   Culture, respiratory (NON-Expectorated)     Status: None   Collection Time: 01/02/16 10:19 AM  Result Value Ref Range Status   Specimen Description BRONCHIAL WASHINGS  Final   Special Requests POF ZOSYN 3.375 PER M.GENGLER ORDER RESP CULTURE  Final   Gram Stain   Final    MODERATE WBC PRESENT, PREDOMINANTLY PMN NO SQUAMOUS EPITHELIAL CELLS SEEN NO ORGANISMS  SEEN Performed at [facility]    Culture   Final    NORMAL OROPHARYNGEAL FLORA Performed at [facility]    Report Status 01/05/2016 FINAL  Final  Anaerobic culture     Status: None   Collection Time: 01/02/16 11:27 AM  Result Value Ref Range Status   Specimen Description TISSUE RIGHT PLEURAL  Final   Special Requests RIGHT PLEURAL PEEL POF ZOSYN  Final   Gram Stain   Final    ABUNDANT WBC PRESENT,BOTH PMN AND MONONUCLEAR FEW SQUAMOUS EPITHELIAL CELLS PRESENT FEW GRAM POSITIVE COCCI IN CLUSTERS Performed at [facility]    Culture   Final  NO ANAEROBES ISOLATED Performed at [facility]    Report Status 01/07/2016 FINAL  Final  Tissue culture     Status: None   Collection Time: 01/02/16 11:27 AM  Result Value Ref Range Status   Specimen Description TISSUE RIGHT PLEURAL  Final   Special Requests RIGHT PLEU PEEL POF ZOSYN  Final   Gram Stain   Final    ABUNDANT WBC PRESENT,BOTH PMN AND MONONUCLEAR FEW SQUAMOUS EPITHELIAL CELLS PRESENT FEW GRAM POSITIVE COCCI IN CLUSTERS Performed at [facility]    Culture   Final    ABUNDANT MICROAEROPHILIC STREPTOCOCCI Note: Standardized susceptibility testing for this organism is not available. Performed at [facility]    Report Status 01/06/2016 FINAL  Final  Culture, body fluid-bottle     Status: None   Collection Time: 01/02/16 11:47 AM  Result Value Ref Range Status   Specimen Description FLUID RIGHT PLEURAL  Final   Special Requests NONE  Final   Culture NO GROWTH 5 DAYS  Final   Report Status 01/07/2016 FINAL  Final  Gram stain     Status: None   Collection Time: 01/02/16 11:47 AM  Result Value Ref Range Status   Specimen Description FLUID RIGHT PLEURAL  Final   Special Requests NONE  Final   Gram Stain   Final    FEW WBC PRESENT, PREDOMINANTLY PMN NO ORGANISMS SEEN    Report Status 01/02/2016 FINAL  Final  Anaerobic culture     Status: None   Collection Time:  01/02/16 11:57 AM  Result Value Ref Range Status   Specimen Description TISSUE RIGHT  Final   Special Requests RIGHT VISCERAL PEEL POF ZOSYN  Final   Gram Stain   Final    MODERATE WBC PRESENT,BOTH PMN AND MONONUCLEAR NO SQUAMOUS EPITHELIAL CELLS SEEN NO ORGANISMS SEEN Performed at [facility]    Culture   Final    NO ANAEROBES ISOLATED Performed at [facility]    Report Status 01/07/2016 FINAL  Final  Tissue culture     Status: None   Collection Time: 01/02/16 11:57 AM  Result Value Ref Range Status   Specimen Description TISSUE RIGHT  Final   Special Requests RIGHT VISCERAL PEEL POF ZOSYN  Final   Gram Stain   Final    MODERATE WBC PRESENT,BOTH PMN AND MONONUCLEAR NO SQUAMOUS EPITHELIAL CELLS SEEN NO ORGANISMS SEEN Performed at [facility]    Culture   Final    FEW MICROAEROPHILIC STREPTOCOCCI Note: Standardized susceptibility testing for this organism is not available. Performed at [facility]    Report Status 01/06/2016 FINAL  Final     Studies: Dg Chest Port 1 View  01/08/2016  CLINICAL DATA:  Chest tube removal EXAM: PORTABLE CHEST 1 VIEW COMPARISON:  01/07/2016 FINDINGS: Cardiomediastinal silhouette is stable. There is right chest tube stable in position. The second more medial right chest tube has been removed. There is no pneumothorax. Persistent small right pleural effusion with right basilar atelectasis or infiltrate. Stable right IJ central line position. IMPRESSION: Stable right chest tube position. The second right chest tube has been removed. No pneumothorax. Right IJ central line is stable in position. Again noted small pleural effusion with right basilar atelectasis or infiltrate. Electronically Signed   By: Natasha Mead M.D.   On: 01/08/2016 08:25    Scheduled Meds: . buPROPion  300 mg Oral Daily  . clonazePAM  1 mg Oral 2 times per day  . divalproex  2,000 mg Oral QHS  . enoxaparin (LOVENOX) injection  40 mg Subcutaneous  Q24H  . escitalopram  20 mg Oral Daily  . fentaNYL   Intravenous 6 times per day  . meropenem (MERREM) IV  1 g Intravenous Q12H  . mometasone-formoterol  2 puff Inhalation BID  . multivitamin with minerals  1 tablet Oral Daily  . pantoprazole  40 mg Oral Q1200  . traZODone  150 mg Oral QHS  . vancomycin  750 mg Intravenous Q12H    Continuous Infusions: . sodium chloride 100 mL/hr at 01/08/16 1300     Time spent:  Hartley Barefoot A MD. [facility] Pager [phone]. If 7PM-7AM, please contact night-coverage at [email or web address], password [facility] 01/08/2016, 4:31 PM  LOS: 8 days

## 2016-01-08 NOTE — Progress Notes (Signed)
ANTIBIOTIC CONSULT NOTE - INITIAL  Pharmacy Consult for Vanco/Merrem Indication: pneumonia/ lung abscess/ empyema  No Known Allergies  Patient Measurements: Height:  (175.3 cm) Weight: 170 lb (77.111 kg) IBW/kg (Calculated) : 70.7  Vital Signs: Temp: 98 F (36.7 C) (02/17 0724) Temp Source: Oral (02/17 0724) BP: 112/70 mmHg (02/17 0300) Pulse Rate: 76 (02/17 0300) Intake/Output from previous day: 02/16 0701 - 02/17 0700 In: 4838.3 [P.O.:2040; I.V.:2298.3; IV Piggyback:500] Out: 955 [Urine:950; Chest Tube:5] Intake/Output from this shift:    Labs:  Recent Labs  01/06/16 0520 01/07/16 0509 01/08/16 0330  WBC 11.6* 10.0  --   HGB 8.3* 8.1*  --   PLT 261 270  --   CREATININE 2.55* 2.27* 2.21*   Estimated Creatinine Clearance: 44.4 mL/min (by C-G formula based on Cr of 2.21).  Recent Labs  01/06/16 0520  [facility] 17     Microbiology:   Medical History: Past Medical History  Diagnosis Date  . Depression   . Anxiety   . Hypertension   . GERD (gastroesophageal reflux disease)   . Seizures [facility])    Assessment: 40 y.o. male admitted on 12/31/2015 with  progressive SOB x 2 wks, nasal cough, congestion and R CP. Pneumonia, large R-sided effusion. Pharmacy has been consulted for Vanco/Zosyn.  JX:[id number] mass in right middle lung lobe. Right middle lobe abscess vs neoplasm and R pleural effusion (parapneumonic vs empyema vs malignant). WBC 10 down. Afeb. - R VATS 2/11  Vanco 2/9 >>  Meropenem 2/14 >> Zosyn 2/9 >>2/14  2/10 VT: 86 (thought to be lab error) 2/13 VR: 41   2/14 VR: 31 2/15: VR: 17 (dosing inadvertently not reordered)  2/9: BC x 2: negative 2/11 tissue cx: MICROAEROPHILIC STREPTOCOCCI 2/11 brochial wash cx: normal flora 2/09 MRSA screen neg   Goal of Therapy:  Vancomycin trough level 15-20 mcg/ml  Plan:  -Meropenem 1 g IV q12h (can lower the seizure threshold) -Vancomycin /12h (resumed 2/16). Could check level  Saturday/Sunday - Monitor for worsening Scr on Vanco again. - Watch K+ level   Icelynn Onken S. Merilynn Finland, PharmD, BCPS Clinical Staff Pharmacist Pager [phone]  Misty Stanley Stillinger 01/08/2016,8:33 AM

## 2016-01-09 ENCOUNTER — Inpatient Hospital Stay (HOSPITAL_COMMUNITY): Payer: Medicare Other

## 2016-01-09 LAB — CBC
HEMATOCRIT: 24.7 % — AB (ref 39.0–52.0)
HEMOGLOBIN: 7.9 g/dL — AB (ref 13.0–17.0)
MCH: 29.6 pg (ref 26.0–34.0)
MCHC: 32 g/dL (ref 30.0–36.0)
MCV: 92.5 fL (ref 78.0–100.0)
Platelets: 303 10*3/uL (ref 150–400)
RBC: 2.67 MIL/uL — AB (ref 4.22–5.81)
RDW: 16.5 % — ABNORMAL HIGH (ref 11.5–15.5)
WBC: 10.3 10*3/uL (ref 4.0–10.5)

## 2016-01-09 LAB — BASIC METABOLIC PANEL
Anion gap: 7 (ref 5–15)
BUN: 22 mg/dL — ABNORMAL HIGH (ref 6–20)
CHLORIDE: 106 mmol/L (ref 101–111)
CO2: 23 mmol/L (ref 22–32)
CREATININE: 2.12 mg/dL — AB (ref 0.61–1.24)
Calcium: 7.9 mg/dL — ABNORMAL LOW (ref 8.9–10.3)
GFR calc non Af Amer: 37 mL/min — ABNORMAL LOW (ref >60)
GFR, EST AFRICAN AMERICAN: 43 mL/min — AB (ref >60)
Glucose, Bld: 94 mg/dL (ref 65–99)
POTASSIUM: 5.1 mmol/L (ref 3.5–5.1)
SODIUM: 136 mmol/L (ref 135–145)

## 2016-01-09 MED ORDER — FERROUS SULFATE 325 (65 FE) MG PO TABS
325.0000 mg | ORAL_TABLET | Freq: Two times a day (BID) | ORAL | Status: DC
  Administered 2016-01-09 – 2016-01-11 (×4): 325 mg via ORAL
  Filled 2016-01-09 (×4): qty 1

## 2016-01-09 MED ORDER — DEXTROSE 5 % IV SOLN
2.0000 g | INTRAVENOUS | Status: DC
  Administered 2016-01-09 – 2016-01-11 (×3): 2 g via INTRAVENOUS
  Filled 2016-01-09 (×3): qty 2

## 2016-01-09 NOTE — Progress Notes (Addendum)
PROGRESS NOTE  [name] ONG:[id number] DOB: 11/14/1976 DOA: 12/31/2015 PCP: Pcp Not In System  Assessment/Plan: Principal Problem:   Pleural effusion, right Active Problems:   Cavitating mass in right middle lung lobe   AKI (acute kidney injury) (HCC)   Acute encephalopathy   1-Right middle lobe abscess with right empyema: s/p Bronchoscopy, right video-assisted thoracoscopy, drainage of empyema, visceral and parietal pleural decortication on 2/12. - on abx/, plan per Ct surgery. -Culture 2-11, pleural grew Microaerophilic streptococcus.  -on meropenem. Vanc, will change antibiotics to Ceftriaxone 2 gr IV . Will follow CVTS recommendation regarding length of treatment.  -Anaerobic from pleural tissue 2-11; few gram positive cluster.  -discontinue fentanyl PCA 2-17 -S/P chest tube removed.   2-Post op  anemia: hgb 7.4 on 2/12, s/p prbc x2units on 2/12, hgb stable after transfusion Repeat labs in am. Start ferrous sulfate.   3-AKI;  ua no infection,  Agree with discontinuation of Toradol.  patient has been on vanc and zosyn, discussed with pharmacy, pharmacy to adjust dose. Continue with IV fluids, decrease rate.   Urine out put 2.3 L on 2-14 Cr trending down 2.6----2.2.--2.2  4-Hyponatremia: resolved.   5-Encephalopathy, AMS;  Patient notice to be lethargic 2-14 Doing better on  decrease trazodone.  Discussed with nurse will hold klonopin if patient is sedated.  Improved.   6-H/o HTN,; hold Norvasc. 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  last discharge summary in 10/2015 by psych he was supposed to taper off librium, however meds intake on admission , patient is still on high dose scheduled librium. Librium taper.  -decrease trazodone.  -decrease klonopin.   Diarrhea; resolved.  Hold laxatives.   Code Status: full  Family Communication: patient   Disposition Plan: pending, ALF vs SNF. Transfer to telemetry  if ok with CVTS   Consultants:  Ct surgery  Procedures: 2/10: picc line placement for poor IV access. 2/11: Bronchoscopy, right video-assisted thoracoscopy, drainage of empyema, visceral and parietal pleural decortication. 2/12 prbc x2   Antibiotics:  Vanc/zosyn from admission.Marland Kitchenzosyn DC 2-14.   Meropenem: 2-14   Subjective;  Alert in no distress, diarrhea resolved. Feeling better    Objective: BP 106/56 mmHg  Pulse 67  Temp(Src) 97.6 F (36.4 C) (Oral)  Resp 25  Ht  (1.753 m)  Wt 77.111 kg (170 lb)  BMI 25.09 kg/m2  SpO2 99%  Intake/Output Summary (Last 24 hours) at 01/09/16 0917 Last data filed at 01/09/16 0600  Gross per 24 hour  Intake 2899.16 ml  Output   3960 ml  Net -1060.84 ml   Filed Weights   12/31/15 1843 01/02/16 1630  Weight: 95.709 kg (211 lb) 77.111 kg (170 lb)    Exam:   General:  NAD  Cardiovascular: RRR  Respiratory: right side with dressing. CTA  Abdomen: Soft/ND/NT, positive BS  Musculoskeletal: No Edema  Neuro: aaox3, flat affect  Data Reviewed: Basic Metabolic Panel:  Recent Labs Lab 01/05/16 1850 01/06/16 0520 01/07/16 0509 01/08/16 0330 01/09/16 0436  NA 137 138 136 134* 136  K 4.9 4.9 5.0 5.1 5.1  CL 99* 102 102 101 106  CO2 GLUCOSE 94 102* 90 120* 94  BUN 33* 29* 24* 23* 22*  CREATININE 2.67* 2.55* 2.27* 2.21* 2.12*  CALCIUM 7.6* 7.6* 7.7* 7.7* 7.9*   Liver Function Tests:  Recent Labs Lab 01/04/16 0500  AST 87*  ALT 52  ALKPHOS 145*  BILITOT 0.7  PROT 3.9*  ALBUMIN 1.3*   No results for input(s): LIPASE, AMYLASE in the last 168 hours. No results for input(s): AMMONIA in the last 168 hours. CBC:  Recent Labs Lab 01/04/16 0500 01/05/16 0430 01/06/16 0520 01/07/16 0509 01/09/16 0436  WBC 12.1* 15.8* 11.6* 10.0 10.3  HGB 8.6* 9.0* 8.3* 8.1* 7.9*  HCT 25.3* 27.1* 25.2* 25.2* 24.7*  MCV 87.8 88.3 92.3 91.3 92.5  PLT 233 277 261 270 303   Cardiac Enzymes:   No results  for input(s): CKTOTAL, CKMB, CKMBINDEX, TROPONINI in the last 168 hours. BNP (last 3 results)  Recent Labs  12/31/15 2000  BNP 62.6    ProBNP (last 3 results) No results for input(s): PROBNP in the last 8760 hours.  CBG:  Recent Labs Lab 01/02/16 1724 01/02/16 2341 01/03/16 0648 01/07/16 0733  GLUCAP 105* 162* 116* 108*    Recent Results (from the past 240 hour(s))  Culture, blood (routine x 2)     Status: None   Collection Time: 12/31/15  9:56 PM  Result Value Ref Range Status   Specimen Description BLOOD LEFT ARM  Final   Special Requests AEROBIC BOTTLE ONLY  Final   Culture NO GROWTH 5 DAYS  Final   Report Status 01/05/2016 FINAL  Final  Culture, blood (routine x 2)     Status: None   Collection Time: 12/31/15 10:01 PM  Result Value Ref Range Status   Specimen Description BLOOD LEFT HAND  Final   Special Requests AEROBIC BOTTLE ONLY  Final   Culture NO GROWTH 5 DAYS  Final   Report Status 01/05/2016 FINAL  Final  Surgical pcr screen     Status: None   Collection Time: 01/02/16  8:07 AM  Result Value Ref Range Status   MRSA, PCR NEGATIVE NEGATIVE Final   Staphylococcus aureus NEGATIVE NEGATIVE Final    Comment:        The Xpert SA Assay (FDA approved for NASAL specimens in patients over 31 years of age), is one component of a comprehensive surveillance program.  Test performance has been validated by [facility] for patients greater than or equal to 64 year old. It is not intended to diagnose infection nor to guide or monitor treatment.   Culture, respiratory (NON-Expectorated)     Status: None   Collection Time: 01/02/16 10:19 AM  Result Value Ref Range Status   Specimen Description BRONCHIAL WASHINGS  Final   Special Requests POF ZOSYN 3.375 PER M.GENGLER ORDER RESP CULTURE  Final   Gram Stain   Final    MODERATE WBC PRESENT, PREDOMINANTLY PMN NO SQUAMOUS EPITHELIAL CELLS SEEN NO ORGANISMS SEEN Performed at [facility]    Culture    Final    NORMAL OROPHARYNGEAL FLORA Performed at [facility]    Report Status 01/05/2016 FINAL  Final  Anaerobic culture     Status: None   Collection Time: 01/02/16 11:27 AM  Result Value Ref Range Status   Specimen Description TISSUE RIGHT PLEURAL  Final   Special Requests RIGHT PLEURAL PEEL POF ZOSYN  Final   Gram Stain   Final    ABUNDANT WBC PRESENT,BOTH PMN AND MONONUCLEAR FEW SQUAMOUS EPITHELIAL CELLS PRESENT FEW GRAM POSITIVE COCCI IN CLUSTERS Performed at [facility]    Culture   Final    NO ANAEROBES ISOLATED Performed at [facility]    Report Status 01/07/2016 FINAL  Final  Tissue culture     Status:  None   Collection Time: 01/02/16 11:27 AM  Result Value Ref Range Status   Specimen Description TISSUE RIGHT PLEURAL  Final   Special Requests RIGHT PLEU PEEL POF ZOSYN  Final   Gram Stain   Final    ABUNDANT WBC PRESENT,BOTH PMN AND MONONUCLEAR FEW SQUAMOUS EPITHELIAL CELLS PRESENT FEW GRAM POSITIVE COCCI IN CLUSTERS Performed at [facility]    Culture   Final    ABUNDANT MICROAEROPHILIC STREPTOCOCCI Note: Standardized susceptibility testing for this organism is not available. Performed at [facility]    Report Status 01/06/2016 FINAL  Final  Culture, body fluid-bottle     Status: None   Collection Time: 01/02/16 11:47 AM  Result Value Ref Range Status   Specimen Description FLUID RIGHT PLEURAL  Final   Special Requests NONE  Final   Culture NO GROWTH 5 DAYS  Final   Report Status 01/07/2016 FINAL  Final  Gram stain     Status: None   Collection Time: 01/02/16 11:47 AM  Result Value Ref Range Status   Specimen Description FLUID RIGHT PLEURAL  Final   Special Requests NONE  Final   Gram Stain   Final    FEW WBC PRESENT, PREDOMINANTLY PMN NO ORGANISMS SEEN    Report Status 01/02/2016 FINAL  Final  Anaerobic culture     Status: None   Collection Time: 01/02/16 11:57 AM  Result Value Ref Range Status    Specimen Description TISSUE RIGHT  Final   Special Requests RIGHT VISCERAL PEEL POF ZOSYN  Final   Gram Stain   Final    MODERATE WBC PRESENT,BOTH PMN AND MONONUCLEAR NO SQUAMOUS EPITHELIAL CELLS SEEN NO ORGANISMS SEEN Performed at [facility]    Culture   Final    NO ANAEROBES ISOLATED Performed at [facility]    Report Status 01/07/2016 FINAL  Final  Tissue culture     Status: None   Collection Time: 01/02/16 11:57 AM  Result Value Ref Range Status   Specimen Description TISSUE RIGHT  Final   Special Requests RIGHT VISCERAL PEEL POF ZOSYN  Final   Gram Stain   Final    MODERATE WBC PRESENT,BOTH PMN AND MONONUCLEAR NO SQUAMOUS EPITHELIAL CELLS SEEN NO ORGANISMS SEEN Performed at [facility]    Culture   Final    FEW MICROAEROPHILIC STREPTOCOCCI Note: Standardized susceptibility testing for this organism is not available. Performed at [facility]    Report Status 01/06/2016 FINAL  Final     Studies: Dg Chest Port 1 View  01/09/2016  CLINICAL DATA:  40 year old male with a history of right-sided empyema EXAM: PORTABLE CHEST 1 VIEW COMPARISON:  Prior chest x-ray 01/07/2006 FINDINGS: Interval removal of right IJ central venous catheter and right-sided chest tube. No evidence of pneumothorax. Stable cardiac and mediastinal contours. Persistent right cyst mid and lower lung patchy airspace opacities and right inferior pleural thickening. The left lung remains well aerated. No acute osseous abnormality. IMPRESSION: 1. Interval removal of central venous catheter and right-sided chest tube. 2. No evidence of pneumothorax. 3. Persistent right lower lung pleural thickening and associated right mid and lower lung patchy airspace opacities which may reflect atelectasis or residual infiltrates. Electronically Signed   By: Malachy Moan M.D.   On: 01/09/2016 07:48    Scheduled Meds: . buPROPion  300 mg Oral Daily  . clonazePAM  1 mg Oral 2 times per  day  . divalproex  2,000 mg Oral QHS  .  enoxaparin (LOVENOX) injection  40 mg Subcutaneous Q24H  . escitalopram  20 mg Oral Daily  . meropenem (MERREM) IV  1 g Intravenous Q12H  . mometasone-formoterol  2 puff Inhalation BID  . multivitamin with minerals  1 tablet Oral Daily  . pantoprazole  40 mg Oral Q1200  . traZODone  150 mg Oral QHS  . vancomycin  750 mg Intravenous Q12H    Continuous Infusions: . sodium chloride 50 mL/hr at 01/09/16 0600     Time spent:  Hartley Barefoot A MD. [facility] Pager [phone]. If 7PM-7AM, please contact night-coverage at [email or web address], password [facility] 01/09/2016, 9:17 AM  LOS: 9 days

## 2016-01-09 NOTE — Progress Notes (Signed)
Pt walked 800 ft. On room air tolerated well

## 2016-01-09 NOTE — Care Management Note (Addendum)
Case Management Note  Patient Details  Name: [name] MRN: [id number] Date of Birth: 29-Jan-1976  Subjective/Objective:    NCM received message from [place] with [facility], she was checking with [facility] facility to see if they were able to do IV ABX if needed, the Rep from [facility] states they are not capable to do IV  ABX, they are a low income facility, they do not have the staff for that, but [place] with [facility] states if he needs a daily iv abx for 7 days or less they can possibly send a HHRN out to do it ( their RN would have to come daily , so it would have to be  once a day).  Patient does not have a family member who can do this for him, he has a legal guardian listed as contact.   If patient will need a longer time than 7 days he will more than likely need to go to a SNF.   Action/Plan:   Expected Discharge Date:                  Expected Discharge Plan:  Assisted Living / Rest Home  In-House Referral:  Clinical Social Work  Discharge planning Services  CM Consult  Post Acute Care Choice:    Choice offered to:     DME Arranged:    DME Agency:     HH Arranged:    HH Agency:     Status of Service:  Completed, signed off  Medicare Important Message Given:  Yes Date Medicare IM Given:    Medicare IM give by:    Date Additional Medicare IM Given:    Additional Medicare Important Message give by:     If discussed at Long Length of Stay Meetings, dates discussed:    Additional Comments:  Leone Haven, RN 01/09/2016, 2:32 PM

## 2016-01-09 NOTE — Progress Notes (Addendum)
      [address]       [place] [address]             [phone]      7 Days Post-Op Procedure(s) (LRB): VIDEO BRONCHOSCOPY (N/A) VIDEO ASSISTED THORACOSCOPY (VATS)/DECORTICATION (Right) DRAINAGE OF PLEURAL EFFUSION (Right)   Subjective:  Feeling better, no further diarrhea  Objective: Vital signs in last 24 hours: Temp:  [97.6 F (36.4 C)-98.4 F (36.9 C)] 97.6 F (36.4 C) (02/18 0800) Pulse Rate:  [64-78] 64 (02/18 0915) Cardiac Rhythm:  [-] Normal sinus rhythm (02/18 0700) Resp:  [16-29] 18 (02/18 0915) BP: (103-117)/(55-70) 117/63 mmHg (02/18 0915) SpO2:  [94 %-99 %] 99 % (02/18 0915)  Intake/Output from previous day: 02/17 0701 - 02/18 0700 In: 3019.2 [P.O.:820; I.V.:1699.2; IV Piggyback:500] Out: 3960 [Urine:3950; Chest Tube:10] Intake/Output this shift: Total I/O In: 240 [P.O.:240] Out: 600 [Urine:600]  General appearance: alert, cooperative and no distress Heart: regular rate and rhythm Lungs: diminished breath sounds bibasilar Abdomen: soft, non-tender; bowel sounds normal; no masses,  no organomegaly Wound: clean and dry  Lab Results:  Recent Labs  01/07/16 0509 01/09/16 0436  WBC 10.0 10.3  HGB 8.1* 7.9*  HCT 25.2* 24.7*  PLT 270 303   BMET:  Recent Labs  01/08/16 0330 01/09/16 0436  NA 134* 136  K 5.1 5.1  CL 101 106  CO2 25 23  GLUCOSE 120* 94  BUN 23* 22*  CREATININE 2.21* 2.12*  CALCIUM 7.7* 7.9*    PT/INR: No results for input(s): LABPROT, INR in the last 72 hours. ABG    Component Value Date/Time   PHART 7.377 01/03/2016 0453   HCO3 28.6* 01/03/2016 0453   TCO2 30.2 01/03/2016 0453   O2SAT 96.5 01/03/2016 0453   CBG (last 3)   Recent Labs  01/07/16 0733  GLUCAP 108*    Assessment/Plan: S/P Procedure(s) (LRB): VIDEO BRONCHOSCOPY (N/A) VIDEO ASSISTED THORACOSCOPY (VATS)/DECORTICATION (Right) DRAINAGE OF PLEURAL EFFUSION (Right)  1. CXR- remains stable post chest tube removal, expected post  surgical changes present 2. ID- + Microaerophilic Strep on tissue culture- on Meropenem, Vancomycin 3. Dispo- patient stable, care per primary, will arrange outpatient follow up for 2 weeks   LOS: 9 days    BARRETT, [name] 01/09/2016  patient examined and medical record reviewed,agree with above note. [name] 01/10/2016

## 2016-01-10 LAB — CBC
HCT: 25.1 % — ABNORMAL LOW (ref 39.0–52.0)
Hemoglobin: 7.9 g/dL — ABNORMAL LOW (ref 13.0–17.0)
MCH: 29.4 pg (ref 26.0–34.0)
MCHC: 31.5 g/dL (ref 30.0–36.0)
MCV: 93.3 fL (ref 78.0–100.0)
PLATELETS: 277 10*3/uL (ref 150–400)
RBC: 2.69 MIL/uL — ABNORMAL LOW (ref 4.22–5.81)
RDW: 16.5 % — AB (ref 11.5–15.5)
WBC: 8.8 10*3/uL (ref 4.0–10.5)

## 2016-01-10 LAB — BASIC METABOLIC PANEL
Anion gap: 8 (ref 5–15)
BUN: 20 mg/dL (ref 6–20)
CALCIUM: 8.1 mg/dL — AB (ref 8.9–10.3)
CO2: 25 mmol/L (ref 22–32)
CREATININE: 2.11 mg/dL — AB (ref 0.61–1.24)
Chloride: 104 mmol/L (ref 101–111)
GFR, EST AFRICAN AMERICAN: 43 mL/min — AB (ref >60)
GFR, EST NON AFRICAN AMERICAN: 38 mL/min — AB (ref >60)
GLUCOSE: 106 mg/dL — AB (ref 65–99)
Potassium: 4.7 mmol/L (ref 3.5–5.1)
Sodium: 137 mmol/L (ref 135–145)

## 2016-01-10 MED ORDER — TRAMADOL HCL 50 MG PO TABS
50.0000 mg | ORAL_TABLET | Freq: Three times a day (TID) | ORAL | Status: DC | PRN
  Administered 2016-01-10 – 2016-01-11 (×3): 50 mg via ORAL
  Filled 2016-01-10 (×3): qty 1

## 2016-01-10 NOTE — Progress Notes (Signed)
PROGRESS NOTE  [name] ZOX:[id number] DOB: [birth year] DOA: 12/31/2015 PCP: Pcp Not In System  Assessment/Plan: Principal Problem:   Pleural effusion, right Active Problems:   Cavitating mass in right middle lung lobe   AKI (acute kidney injury) (HCC)   Acute encephalopathy   1-Right middle lobe abscess with right empyema: s/p Bronchoscopy, right video-assisted thoracoscopy, drainage of empyema, visceral and parietal pleural decortication on 2/12. - on abx/, plan per Ct surgery. -Culture 2-11, pleural grew Microaerophilic streptococcus.  -on meropenem. Vanc, will change antibiotics to Ceftriaxone 2 gr IV . -Anaerobic from pleural tissue 2-11; few gram positive cluster.  -Discontinue fentanyl PCA 2-17 -S/P chest tube removed.  -Plan to discharge on Augmentin for 2 weeks.   2-Post op  anemia: hgb 7.4 on 2/12, s/p prbc x2units on 2/12, hgb stable after transfusion Repeat labs in am. Started ferrous sulfate.   3-AKI;  ua no infection,  Agree with discontinuation of Toradol.  patient has been on vanc and zosyn, discussed with pharmacy, pharmacy to adjust dose. Continue with IV fluids, decrease rate.   Urine out put 2.3 L on 2-14 Cr trending down 2.6----2.2.--2.2 Cr stable at 2.2.   4-Hyponatremia: resolved.   5-Encephalopathy, AMS;  Patient notice to be lethargic 2-14 Doing better on  decrease trazodone.  Discussed with nurse will hold klonopin if patient is sedated.  Improved.   6-H/o HTN,; hold Norvasc. 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  last discharge summary in 10/2015 by psych he was supposed to taper off librium, however meds intake on admission , patient is still on high dose scheduled librium. Librium taper.  -decrease trazodone.  -decrease klonopin.   Diarrhea; resolved.  Hold laxatives.   Code Status: full  Family Communication: patient   Disposition Plan: pending, ALF vs SNF. PT evaluation     Consultants:  Ct surgery  Procedures: 2/10: picc line placement for poor IV access. 2/11: Bronchoscopy, right video-assisted thoracoscopy, drainage of empyema, visceral and parietal pleural decortication. 2/12 prbc x2   Antibiotics:  Vanc/zosyn from admission.Marland Kitchenzosyn DC 2-14.   Meropenem: 2-14   Subjective;  No new complaints. Still with chest pain, when he cough    Objective: BP 109/63 mmHg  Pulse 70  Temp(Src) 99.2 F (37.3 C) (Oral)  Resp 18  Ht  (1.753 m)  Wt 87.7 kg (193 lb 5.5 oz)  BMI 28.54 kg/m2  SpO2 96%  Intake/Output Summary (Last 24 hours) at 01/10/16 1450 Last data filed at 01/10/16 1300  Gross per 24 hour  Intake   1316 ml  Output   3110 ml  Net  -1794 ml   Filed Weights   12/31/15 1843 01/02/16 1630 01/10/16 0400  Weight: 95.709 kg (211 lb) 77.111 kg (170 lb) 87.7 kg (193 lb 5.5 oz)    Exam:   General:  NAD  Cardiovascular: RRR  Respiratory: right side with dressing. CTA  Abdomen: Soft/ND/NT, positive BS  Musculoskeletal: No Edema  Neuro: aaox3, flat affect  Data Reviewed: Basic Metabolic Panel:  Recent Labs Lab 01/06/16 0520 01/07/16 0509 01/08/16 0330 01/09/16 0436 01/10/16 0237  NA 138 136 134* 136 137  K 4.9 5.0 5.1 5.1 4.7  CL 102 102 101 106 104  CO2 GLUCOSE 102* 90 120* 94 106*  BUN 29* 24* 23* 22* 20  CREATININE 2.55* 2.27* 2.21* 2.12* 2.11*  CALCIUM 7.6* 7.7* 7.7* 7.9* 8.1*   Liver Function  Tests:  Recent Labs Lab 01/04/16 0500  AST 87*  ALT 52  ALKPHOS 145*  BILITOT 0.7  PROT 3.9*  ALBUMIN 1.3*   No results for input(s): LIPASE, AMYLASE in the last 168 hours. No results for input(s): AMMONIA in the last 168 hours. CBC:  Recent Labs Lab 01/05/16 0430 01/06/16 0520 01/07/16 0509 01/09/16 0436 01/10/16 0237  WBC 15.8* 11.6* 10.0 10.3 8.8  HGB 9.0* 8.3* 8.1* 7.9* 7.9*  HCT 27.1* 25.2* 25.2* 24.7* 25.1*  MCV 88.3 92.3 91.3 92.5 93.3  PLT 277 261 270 303 277    Cardiac Enzymes:   No results for input(s): CKTOTAL, CKMB, CKMBINDEX, TROPONINI in the last 168 hours. BNP (last 3 results)  Recent Labs  12/31/15 2000  BNP 62.6    ProBNP (last 3 results) No results for input(s): PROBNP in the last 8760 hours.  CBG:  Recent Labs Lab 01/07/16 0733  GLUCAP 108*    Recent Results (from the past 240 hour(s))  Culture, blood (routine x 2)     Status: None   Collection Time: 12/31/15  9:56 PM  Result Value Ref Range Status   Specimen Description BLOOD LEFT ARM  Final   Special Requests AEROBIC BOTTLE ONLY  Final   Culture NO GROWTH 5 DAYS  Final   Report Status 01/05/2016 FINAL  Final  Culture, blood (routine x 2)     Status: None   Collection Time: 12/31/15 10:01 PM  Result Value Ref Range Status   Specimen Description BLOOD LEFT HAND  Final   Special Requests AEROBIC BOTTLE ONLY  Final   Culture NO GROWTH 5 DAYS  Final   Report Status 01/05/2016 FINAL  Final  Surgical pcr screen     Status: None   Collection Time: 01/02/16  8:07 AM  Result Value Ref Range Status   MRSA, PCR NEGATIVE NEGATIVE Final   Staphylococcus aureus NEGATIVE NEGATIVE Final    Comment:        The Xpert SA Assay (FDA approved for NASAL specimens in patients over 32 years of age), is one component of a comprehensive surveillance program.  Test performance has been validated by [facility] for patients greater than or equal to 86 year old. It is not intended to diagnose infection nor to guide or monitor treatment.   Culture, respiratory (NON-Expectorated)     Status: None   Collection Time: 01/02/16 10:19 AM  Result Value Ref Range Status   Specimen Description BRONCHIAL WASHINGS  Final   Special Requests POF ZOSYN 3.375 PER M.GENGLER ORDER RESP CULTURE  Final   Gram Stain   Final    MODERATE WBC PRESENT, PREDOMINANTLY PMN NO SQUAMOUS EPITHELIAL CELLS SEEN NO ORGANISMS SEEN Performed at [facility]    Culture   Final    NORMAL  OROPHARYNGEAL FLORA Performed at [facility]    Report Status 01/05/2016 FINAL  Final  Anaerobic culture     Status: None   Collection Time: 01/02/16 11:27 AM  Result Value Ref Range Status   Specimen Description TISSUE RIGHT PLEURAL  Final   Special Requests RIGHT PLEURAL PEEL POF ZOSYN  Final   Gram Stain   Final    ABUNDANT WBC PRESENT,BOTH PMN AND MONONUCLEAR FEW SQUAMOUS EPITHELIAL CELLS PRESENT FEW GRAM POSITIVE COCCI IN CLUSTERS Performed at [facility]    Culture   Final    NO ANAEROBES ISOLATED Performed at [facility]    Report Status 01/07/2016 FINAL  Final  Tissue culture     Status: None   Collection Time: 01/02/16 11:27 AM  Result Value Ref Range Status   Specimen Description TISSUE RIGHT PLEURAL  Final   Special Requests RIGHT PLEU PEEL POF ZOSYN  Final   Gram Stain   Final    ABUNDANT WBC PRESENT,BOTH PMN AND MONONUCLEAR FEW SQUAMOUS EPITHELIAL CELLS PRESENT FEW GRAM POSITIVE COCCI IN CLUSTERS Performed at [facility]    Culture   Final    ABUNDANT MICROAEROPHILIC STREPTOCOCCI Note: Standardized susceptibility testing for this organism is not available. Performed at [facility]    Report Status 01/06/2016 FINAL  Final  Culture, body fluid-bottle     Status: None   Collection Time: 01/02/16 11:47 AM  Result Value Ref Range Status   Specimen Description FLUID RIGHT PLEURAL  Final   Special Requests NONE  Final   Culture NO GROWTH 5 DAYS  Final   Report Status 01/07/2016 FINAL  Final  Gram stain     Status: None   Collection Time: 01/02/16 11:47 AM  Result Value Ref Range Status   Specimen Description FLUID RIGHT PLEURAL  Final   Special Requests NONE  Final   Gram Stain   Final    FEW WBC PRESENT, PREDOMINANTLY PMN NO ORGANISMS SEEN    Report Status 01/02/2016 FINAL  Final  Anaerobic culture     Status: None   Collection Time: 01/02/16 11:57 AM  Result Value Ref Range Status   Specimen Description  TISSUE RIGHT  Final   Special Requests RIGHT VISCERAL PEEL POF ZOSYN  Final   Gram Stain   Final    MODERATE WBC PRESENT,BOTH PMN AND MONONUCLEAR NO SQUAMOUS EPITHELIAL CELLS SEEN NO ORGANISMS SEEN Performed at [facility]    Culture   Final    NO ANAEROBES ISOLATED Performed at [facility]    Report Status 01/07/2016 FINAL  Final  Tissue culture     Status: None   Collection Time: 01/02/16 11:57 AM  Result Value Ref Range Status   Specimen Description TISSUE RIGHT  Final   Special Requests RIGHT VISCERAL PEEL POF ZOSYN  Final   Gram Stain   Final    MODERATE WBC PRESENT,BOTH PMN AND MONONUCLEAR NO SQUAMOUS EPITHELIAL CELLS SEEN NO ORGANISMS SEEN Performed at [facility]    Culture   Final    FEW MICROAEROPHILIC STREPTOCOCCI Note: Standardized susceptibility testing for this organism is not available. Performed at [facility]    Report Status 01/06/2016 FINAL  Final     Studies: No results found.  Scheduled Meds: . buPROPion  300 mg Oral Daily  . cefTRIAXone (ROCEPHIN)  IV  2 g Intravenous Q24H  . clonazePAM  1 mg Oral 2 times per day  . divalproex  2,000 mg Oral QHS  . enoxaparin (LOVENOX) injection  40 mg Subcutaneous Q24H  . escitalopram  20 mg Oral Daily  . ferrous sulfate  325 mg Oral BID WC  . mometasone-formoterol  2 puff Inhalation BID  . multivitamin with minerals  1 tablet Oral Daily  . pantoprazole  40 mg Oral Q1200  . traZODone  150 mg Oral QHS    Continuous Infusions:     Time spent:  Hartley Barefoot A MD. [facility] Pager [phone]. If 7PM-7AM, please contact night-coverage at [email or web address], password [facility] 01/10/2016, 2:50 PM  LOS: 10 days

## 2016-01-10 NOTE — Progress Notes (Signed)
Cm called and left VM for Eric with SW to advise of need for ALF vs SNF.

## 2016-01-10 NOTE — Clinical Social Work Note (Signed)
CSW contacted [employer] and spoke with Kenney Houseman, she requested FL2 and discharge summary be faxed to ALF once he is medically ready for discharge and orders have been received.  ALF said they can take patient back once he is medically ready for discharge during the week.  CSW to continue to follow patient's progress.  Ervin Knack. Randy Whitener, MSW, Theresia Majors [phone] 01/10/2016 5:35 PM

## 2016-01-10 NOTE — Progress Notes (Addendum)
      [address]       [name] [address]             [phone]      8 Days Post-Op Procedure(s) (LRB): VIDEO BRONCHOSCOPY (N/A) VIDEO ASSISTED THORACOSCOPY (VATS)/DECORTICATION (Right) DRAINAGE OF PLEURAL EFFUSION (Right)   Subjective:  [name] has no complaints.  He continues to feel better.  He has had no further diarrhea.  He is ambulating around the unit  Objective: Vital signs in last 24 hours: Temp:  [98.9 F (37.2 C)-99.1 F (37.3 C)] 99.1 F (37.3 C) (02/19 0400) Pulse Rate:  [64-69] 65 (02/19 0400) Cardiac Rhythm:  [-] Normal sinus rhythm (02/18 1908) Resp:  [18-19] 18 (02/19 0400) BP: (110-117)/(63-67) 110/66 mmHg (02/19 0400) SpO2:  [94 %-99 %] 94 % (02/19 0400) Weight:  [193 lb 5.5 oz (87.7 kg)] 193 lb 5.5 oz (87.7 kg) (02/19 0400)  Intake/Output from previous day: 02/18 0701 - 02/19 0700 In: 600 [P.O.:600] Out: 2260 [Urine:2260]  General appearance: alert, cooperative and no distress Heart: regular rate and rhythm Lungs: diminished breath sounds bibasilar Abdomen: soft, non-tender; bowel sounds normal; no masses,  no organomegaly Wound: clean and dry  Lab Results:  Recent Labs  01/09/16 0436 01/10/16 0237  WBC 10.3 8.8  HGB 7.9* 7.9*  HCT 24.7* 25.1*  PLT 303 277   BMET:  Recent Labs  01/09/16 0436 01/10/16 0237  NA 136 137  K 5.1 4.7  CL 106 104  CO2 23 25  GLUCOSE 94 106*  BUN 22* 20  CREATININE 2.12* 2.11*  CALCIUM 7.9* 8.1*    PT/INR: No results for input(s): LABPROT, INR in the last 72 hours. ABG    Component Value Date/Time   PHART 7.377 01/03/2016 0453   HCO3 28.6* 01/03/2016 0453   TCO2 30.2 01/03/2016 0453   O2SAT 96.5 01/03/2016 0453   CBG (last 3)  No results for input(s): GLUCAP in the last 72 hours.  Assessment/Plan: S/P Procedure(s) (LRB): VIDEO BRONCHOSCOPY (N/A) VIDEO ASSISTED THORACOSCOPY (VATS)/DECORTICATION (Right) DRAINAGE OF PLEURAL EFFUSION (Right)  1. ID- Empyema, cultures  show Microaerophilic strep- will need a week of IV ABX, then can be discharged home on 2 weeks of Augmentin 2. Dispo- patient stable, continue ABX, office will contact patient with 2 week follow up appointment, plan per primary   LOS: 10 days    BARRETT, ERIN 01/10/2016  Chest x-ray satisfactory with residual airspace disease Surgical incision healing appropriately 2 weeks of oral Augmentin following discharge with follow-up in our office to be arranged patient examined and medical record reviewed,agree with above note. [name] 01/10/2016

## 2016-01-10 NOTE — Progress Notes (Signed)
Utilization review completed.  

## 2016-01-11 DIAGNOSIS — J984 Other disorders of lung: Secondary | ICD-10-CM

## 2016-01-11 MED ORDER — OXYCODONE HCL 5 MG PO TABS: 5.0000 mg | ORAL_TABLET | ORAL | Status: DC | PRN

## 2016-01-11 MED ORDER — TRAMADOL HCL 50 MG PO TABS: 50.0000 mg | ORAL_TABLET | Freq: Three times a day (TID) | ORAL | Status: DC | PRN

## 2016-01-11 MED ORDER — CLONAZEPAM 1 MG PO TABS: 1.0000 mg | ORAL_TABLET | Freq: Two times a day (BID) | ORAL | Status: DC

## 2016-01-11 MED ORDER — TRAZODONE HCL 150 MG PO TABS: 150.0000 mg | ORAL_TABLET | Freq: Every day | ORAL | Status: DC

## 2016-01-11 MED ORDER — FERROUS SULFATE 325 (65 FE) MG PO TABS: 325.0000 mg | ORAL_TABLET | Freq: Two times a day (BID) | ORAL | Status: DC

## 2016-01-11 MED ORDER — AMOXICILLIN-POT CLAVULANATE 875-125 MG PO TABS: 1.0000 | ORAL_TABLET | Freq: Two times a day (BID) | ORAL | Status: DC

## 2016-01-11 NOTE — Care Management Important Message (Signed)
Important Message  Patient Details  Name: [name] MRN: [id number] Date of Birth: [birth year]   Medicare Important Message Given:  Yes    Toshiye Kever, Stephan Minister 01/11/2016, 11:10 AM

## 2016-01-11 NOTE — Progress Notes (Signed)
Patient discharged and transported via [facility].  Attempted to call [facility] at [phone] and was not able to get an answer. Thomas Hoff, RN

## 2016-01-11 NOTE — NC FL2 (Signed)
[place] MEDICAID FL2 LEVEL OF CARE SCREENING TOOL     IDENTIFICATION  Patient Name: [name] Birthdate: 01-07-1976 Sex: male Admission Date (Current Location): 12/31/2015  [facility] and [place] Number:  Producer, television/film/video and Address:  [facility], [address] [place], [place] [address]      Provider Number: [id number]  Attending Physician Name and Address:  Alba Cory, MD  Relative Name and Phone Number:  Legal Noemi Chapel, [phone]    Current Level of Care: Hospital Recommended Level of Care: Assisted Living Facility Prior Approval Number:    Date Approved/Denied:   PASRR Number:    Discharge Plan: Other (Comment) ([facility])    Current Diagnoses: Patient Active Problem List   Diagnosis Date Noted  . AKI (acute kidney injury) (HCC) 01/06/2016  . Acute encephalopathy 01/06/2016  . 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 Pleural effusion, right 12/31/2015  . GERD (gastroesophageal reflux disease) 11/11/2015  . Fracture of third toe, right, closed 11/11/2015  . Borderline personality disorder 11/11/2015  . Alcohol use disorder, severe, in controlled environment (HCC) 11/11/2015  . 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 Tobacco abuse 11/05/2013  . HTN (hypertension) 11/05/2013    Orientation RESPIRATION BLADDER Height & Weight     Self, Time, Situation, Place  Normal Continent Weight: 187 lb 6.3 oz (85 kg) Height:   (175.3 cm)  BEHAVIORAL SYMPTOMS/MOOD NEUROLOGICAL BOWEL NUTRITION STATUS      Incontinent Diet (Regular)  AMBULATORY STATUS COMMUNICATION OF NEEDS Skin   Limited Assist Verbally Surgical wounds (Closed incision on chest)                       Personal Care Assistance Level of Assistance  Bathing, Feeding, Dressing Bathing Assistance: Limited  assistance Feeding assistance: Independent Dressing Assistance: Limited assistance     Functional Limitations Info             SPECIAL CARE FACTORS FREQUENCY  PT (By licensed PT)     PT Frequency: 3x/week              Contractures      Additional Factors Info  Code Status, Allergies, Psychotropic Code Status Info: Full Allergies Info: NKA Psychotropic Info: Wellbutrin, Trazadone         Current Medications (01/11/2016):    Discharge Medications: START taking these medications   Details  amoxicillin-clavulanate (AUGMENTIN) 875-125 MG tablet Take 1 tablet by mouth 2 (two) times daily. Qty: 28 tablet, Refills: 0    ferrous sulfate 325 (65 FE) MG tablet Take 1 tablet (325 mg total) by mouth 2 (two) times daily with a meal. Qty: 30 tablet, Refills: 3    oxyCODONE (OXY IR/ROXICODONE) 5 MG immediate release tablet Take 1 tablet (5 mg total) by mouth every 4 (four) hours as needed for severe pain. Qty: 30 tablet, Refills: 0    traMADol (ULTRAM) 50 MG tablet Take 1 tablet (50 mg total) by mouth every 8 (eight) hours as needed for moderate pain. Qty: 30 tablet, Refills: 0      CONTINUE these medications which have CHANGED   Details  clonazePAM (KLONOPIN) 1 MG tablet Take 1 tablet (1 mg total) by mouth 2 (two) times daily. Qty: 30 tablet, Refills: 0    traZODone (DESYREL) 150 MG tablet Take 1 tablet (150 mg total) by mouth  at bedtime. Qty: 30 tablet, Refills: 0      CONTINUE these medications which have NOT CHANGED   Details  buPROPion (WELLBUTRIN XL) 300 MG 24 hr tablet Take 300 mg by mouth daily.    divalproex (DEPAKOTE ER) 500 MG 24 hr tablet Take 4 tablets (2,000 mg total) by mouth at bedtime. Qty: 120 tablet, Refills: 0    escitalopram (LEXAPRO) 20 MG tablet Take 20 mg by mouth every morning.    esomeprazole (NEXIUM) 40 MG capsule Take 40 mg by mouth daily.     Fluticasone-Salmeterol (ADVAIR) 250-50 MCG/DOSE  AEPB Inhale 1 puff into the lungs 2 (two) times daily.    hydrOXYzine (ATARAX/VISTARIL) 50 MG tablet Take 50 mg by mouth 3 (three) times daily as needed for anxiety.    Melatonin 5 MG TABS Take 10 mg by mouth at bedtime.    Multiple Vitamins-Minerals (THEREMS-M) TABS Take 1 tablet by mouth daily.      STOP taking these medications     amLODipine (NORVASC) 2.5 MG tablet      chlordiazePOXIDE (LIBRIUM) 25 MG capsule      meloxicam (MOBIC) 15 MG tablet            Relevant Imaging Results:  Relevant Lab Results:   Additional Information    Mearl Latin, LCSWA

## 2016-01-11 NOTE — Progress Notes (Signed)
Patient will DC to: [facility] Anticipated DC date: 01/11/16 Family notified: Engineer, maintenance (IT) by: [facility] 3:30pm  CSW signing off.  Cristobal Goldmann, [place] Clinical Social Worker [phone]

## 2016-01-11 NOTE — Evaluation (Signed)
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  Now s/p Bronchoscopy w/ R VATS partial pleural decortication on 01/02/16.   Clinical Impression   Pt reports being supervised for shower transfers, but able to performing bathing, dressing and toileting independently.  Pt awakened for evaluation.  Presents with impaired cognition, flat affect, tremor, generalized weakness, and impaired standing balance interfering with ability to perform ADL at his baseline.  He reports R flank pain, but it did not appear to interfere with movement.  Will follow acutely.  Recommend return to ALF with 24 hour supervision.    Follow Up Recommendations  No OT follow up;Supervision/Assistance - 24 hour at ALF   Equipment Recommendations  None recommended by OT    Recommendations for Other Services       Precautions / Restrictions Precautions Precautions: Fall Restrictions Weight Bearing Restrictions: No      Mobility Bed Mobility Overal bed mobility: Needs Assistance Bed Mobility: Sit to Supine       Sit to supine: Modified independent (Device/Increase time)   General bed mobility comments: pt able to long sit and pivot to EOB  Transfers Overall transfer level: Needs assistance Equipment used: Rolling walker (2 wheeled) Transfers: Sit to/from [employer] Sit to Stand: Min guard         General transfer comment: close guard, cues for hand placement    Balance     Sitting balance-Leahy Scale: Good       Standing balance-Leahy Scale: Fair                              ADL Overall ADL's : Needs assistance/impaired Eating/Feeding: Independent;Bed level   Grooming: Wash/dry hands;Wash/dry  face;Sitting;Set up   Upper Body Bathing: Set up;Sitting   Lower Body Bathing: Sit to/from stand;Min guard   Upper Body Dressing : Set up;Sitting   Lower Body Dressing: Sit to/from stand;Min guard   Toilet Transfer: Min guard;RW           Functional mobility during ADLs: Min guard;Rolling walker       Vision     Perception     Praxis      Pertinent Vitals/Pain Pain Assessment: Faces Faces Pain Scale: Hurts a little bit Pain Location: R flank Pain Descriptors / Indicators: Sore Pain Intervention(s): Limited activity within patient's tolerance;Monitored during session;Repositioned     Hand Dominance Right   Extremity/Trunk Assessment Upper Extremity Assessment Upper Extremity Assessment: Generalized weakness (baseline tremor)   Lower Extremity Assessment Lower Extremity Assessment: Defer to PT evaluation   Cervical / Trunk Assessment Cervical / Trunk Assessment: Normal   Communication Communication Communication: No difficulties   Cognition Arousal/Alertness: Awake/alert Behavior During Therapy: Flat affect Overall Cognitive Status: No family/caregiver present to determine baseline cognitive functioning                     General Comments       Exercises       Shoulder Instructions      Home Living Family/patient expects to be discharged to:: Assisted living Living Arrangements:  (ALF) Available Help at Discharge: Personal care attendant;Available 24 hours/day Type of Home: Assisted living Home Access: Level entry  Home Layout: One level     Bathroom Shower/Tub: Runner, broadcasting/film/video: None          Prior Functioning/Environment Level of Independence: Needs assistance    ADL's / Homemaking Assistance Needed: reports staff helping him get in and out of shower, otherwise able to care for himself        OT Diagnosis: Generalized weakness;Cognitive deficits;Acute pain   OT Problem List: Decreased  strength;Decreased activity tolerance;Impaired balance (sitting and/or standing);Decreased cognition;Decreased safety awareness;Decreased knowledge of use of DME or AE   OT Treatment/Interventions: Self-care/ADL training;DME and/or AE instruction;Energy conservation;Cognitive remediation/compensation;Patient/family education;Balance training;Therapeutic activities    OT Goals(Current goals can be found in the care plan section) Acute Rehab OT Goals Patient Stated Goal: decreased pain OT Goal Formulation: With patient Time For Goal Achievement: 01/18/16 Potential to Achieve Goals: Good ADL Goals Pt Will Perform Grooming: with supervision;standing Pt Will Perform Lower Body Bathing: with supervision;sit to/from stand Pt Will Perform Lower Body Dressing: with supervision;sit to/from stand Pt Will Transfer to Toilet: with supervision;ambulating;regular height toilet Pt Will Perform Toileting - Clothing Manipulation and hygiene: with supervision;sit to/from stand Pt Will Perform Tub/Shower Transfer: Shower transfer;with supervision;ambulating;rolling walker Additional ADL Goal #1: Pt will generalize energy conservation strategies in ADL with supervision.  OT Frequency: Min 2X/week   Barriers to D/C:            Co-evaluation              End of Session    Activity Tolerance: Patient limited by fatigue Patient left: in bed;with call bell/phone within reach   Time: 0900-0916 OT Time Calculation (min): 16 min Charges:  OT General Charges $OT Visit: 1 Procedure OT Evaluation $OT Eval Low Complexity: 1 Procedure G-Codes:    Evern Bio 01/11/2016, 9:58 AM  [phone]

## 2016-01-11 NOTE — Progress Notes (Signed)
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  Now s/p Bronchoscopy w/ R VATS partial pleural decortication on 01/02/16.    PT Comments    Dale ambulated 200 ft in hallway w/ RW, min instability noted w/ high level balance activities.  Per chart review, pt is planning on returning back to [facility] at d/c.  He will benefit from HHPT services.  Pt will benefit from continued skilled PT services to increase functional independence and safety.   Follow Up Recommendations  Home health PT;Other (comment) [facility] aide)     Equipment Recommendations  None recommended by PT    Recommendations for Other Services       Precautions / Restrictions Precautions Precautions: Fall Restrictions Weight Bearing Restrictions: No    Mobility  Bed Mobility Overal bed mobility: Modified Independent Bed Mobility: Supine to Sit;Sit to Supine     Supine to sit: Modified independent (Device/Increase time) Sit to supine: Modified independent (Device/Increase time)   General bed mobility comments: Increased time.  Pt sits up into crossed leg position. Cues to sit EOB   Transfers Overall transfer level: Needs assistance Equipment used: Rolling walker (2 wheeled) Transfers: Sit to/from Stand Sit to Stand: Supervision         General transfer comment: Cues for hand placement.  Min instability noted  Ambulation/Gait Ambulation/Gait assistance: Supervision Ambulation Distance (Feet): 200 Feet Assistive device: Rolling walker (2 wheeled) Gait Pattern/deviations: Step-through pattern;Antalgic;Decreased stride length   Gait velocity interpretation: Below normal speed for age/gender General Gait Details: Pt managing RW well, supervision for safety.   Min instability w/ high level balance activities as listed below.   Stairs            Wheelchair Mobility    Modified Rankin (Stroke Patients Only)       Balance Overall balance assessment: Needs assistance Sitting-balance support: No upper extremity supported;Feet supported Sitting balance-Leahy Scale: Good     Standing balance support: Bilateral upper extremity supported;During functional activity Standing balance-Leahy Scale: Fair               High level balance activites: Backward walking;Sudden stops;Head turns;Turns High Level Balance Comments: min instability noted, specifically w/ backward walking    Cognition Arousal/Alertness: Awake/alert Behavior During Therapy: Flat affect Overall Cognitive Status: No family/caregiver present to determine baseline cognitive functioning                      Exercises General Exercises - Lower Extremity Ankle Circles/Pumps: AROM;Both;10 reps;Seated Long Arc Quad: AROM;Both;10 reps;Seated Other Exercises Other Exercises: manually resisted Bil knee flexion and extension x10    General Comments General comments (skin integrity, edema, etc.): VSS      Pertinent Vitals/Pain Pain Assessment: No/denies pain Faces Pain Scale: Hurts a little bit Pain Location: R flank Pain Descriptors / Indicators: Sore Pain Intervention(s): Limited activity within patient's tolerance;Monitored during session;Repositioned    Home Living Family/patient expects to be discharged to:: Assisted living Living Arrangements:  (ALF) Available Help at Discharge: Personal care attendant;Available 24 hours/day Type of Home: Assisted living Home Access: Level entry   Home Layout: One level Home Equipment: None      Prior Function Level of Independence: Needs assistance    ADL's / Homemaking Assistance Needed: reports  staff helping him get in and out of shower, otherwise able to care for himself     PT Goals (current goals can  now be found in the care plan section) Acute Rehab PT Goals Patient Stated Goal: none stated PT Goal Formulation: With patient Time For Goal Achievement: 01/11/16 Potential to Achieve Goals: Good Progress towards PT goals: Progressing toward goals    Frequency  Min 3X/week    PT Plan Current plan remains appropriate    Co-evaluation             End of Session Equipment Utilized During Treatment: Gait belt Activity Tolerance: Patient tolerated treatment well;Patient limited by fatigue Patient left: with call bell/phone within reach;in bed;with bed alarm set     Time: 1610-9604 PT Time Calculation (min) (ACUTE ONLY): 14 min  Charges:  $Gait Training: 8-22 mins                    G Codes:      Michail Jewels PT, DPT [phone] Pager: [phone] 01/11/2016, 11:47 AM

## 2016-01-11 NOTE — Discharge Summary (Signed)
Physician Discharge Summary  [name] OJJ:[id number] DOB: 1976/11/11 DOA: 12/31/2015  PCP: Pcp Not In System  Admit date: 12/31/2015 Discharge date: 01/11/2016  Time spent: 35 minutes  Recommendations for Outpatient Follow-up:  Needs CBC to follow hb level Need B-met to follow renal function  Needs to follow up with Dr Roxan Hockey in 2 weeks   Discharge Diagnoses:   Right middle lobe abscess with right empyema   Pleural effusion, right   Cavitating mass in right middle lung lobe   AKI (acute kidney injury) ([place])   Acute encephalopathy   Discharge Condition: stable  Diet recommendation: heart healthy  Filed Weights   01/02/16 1630 01/10/16 0400 01/11/16 0400  Weight: 77.111 kg (170 lb) 87.7 kg (193 lb 5.5 oz) 85 kg (187 lb 6.3 oz)    History of present illness:  HPI: [name] is a 40 y.o. male who presents to the ED with 2 week history of progressive SOB. He also has nasal cough / congestion, and R sided chest pain. Symptoms have been getting progressively worse since onset, nothing makes symptoms better or worse, has tried nothing for symptoms.  Hospital Course:   1-Right middle lobe abscess with right empyema: s/p Bronchoscopy, right video-assisted thoracoscopy, drainage of empyema, visceral and parietal pleural decortication on 2/12. - on abx/, plan per Ct surgery. -Culture 2-11, pleural grew Microaerophilic streptococcus.  -on meropenem. Vanc, will change antibiotics to Ceftriaxone 2 gr IV . -Anaerobic from pleural tissue 2-11; few gram positive cluster.  -Discontinue fentanyl PCA 2-17 -S/P chest tube removed.  -Plan to discharge on Augmentin for 2 weeks.   2-Post op anemia: hgb 7.4 on 2/12, s/p prbc x2units on 2/12, hgb stable after transfusion Repeat labs in am. Started ferrous sulfate.  Hb stable  3-AKI;  ua no infection,  Agree with discontinuation of Toradol.  patient has been on vanc and zosyn, discussed with pharmacy, pharmacy to  adjust dose. Continue with IV fluids, decrease rate.  Urine out put 2.3 L on 2-14 Cr trending down 2.6----2.2.--2.2 Cr stable at 2.2.  Avoid NSAID.   4-Hyponatremia: resolved.   5-Encephalopathy, AMS; Patient notice to be lethargic 2-14 Doing better on decrease trazodone, librium discontinue, Klonopin decrease to 1 mg BID>  Carefull with opioids.  Improved.   6-H/o HTN,; hold Norvasc. 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 last discharge summary in 10/2015 by psych he was supposed to taper off librium, however meds intake on admission , patient is still on high dose scheduled librium. Librium stopped.  -decrease trazodone.  -decrease klonopin.   Diarrhea; resolved.  Hold laxatives.   Procedures: 2/10: picc line placement for poor IV access. 2/11: Bronchoscopy, right video-assisted thoracoscopy, drainage of empyema, visceral and parietal pleural decortication. 2/12 prbc x2   Consultations:  CVTS  Discharge Exam: Filed Vitals:   01/11/16 0400 01/11/16 1346  BP: 113/71 108/70  Pulse: 60 66  Temp: 99.1 F (37.3 C) 98.5 F (36.9 C)  Resp: 18 18    General: Alert in no dsitress Cardiovascular: S 1, S 2 RRR Respiratory: CTA  Discharge Instructions   Discharge Instructions    Diet - low sodium heart healthy    Complete by:  As directed      Increase activity slowly    Complete by:  As directed           Current Discharge Medication List    START taking these medications   Details  amoxicillin-clavulanate (AUGMENTIN) 875-125 MG tablet Take 1 tablet by mouth 2 (two) times daily. Qty: 28 tablet, Refills: 0    ferrous sulfate 325 (65 FE) MG tablet Take 1 tablet (325 mg total) by mouth 2 (two) times daily with a meal. Qty: 30 tablet, Refills: 3    oxyCODONE (OXY IR/ROXICODONE) 5 MG immediate release tablet Take 1 tablet (5 mg total) by mouth every 4 (four) hours as needed for severe pain. Qty: 30 tablet,  Refills: 0    traMADol (ULTRAM) 50 MG tablet Take 1 tablet (50 mg total) by mouth every 8 (eight) hours as needed for moderate pain. Qty: 30 tablet, Refills: 0      CONTINUE these medications which have CHANGED   Details  clonazePAM (KLONOPIN) 1 MG tablet Take 1 tablet (1 mg total) by mouth 2 (two) times daily. Qty: 30 tablet, Refills: 0    traZODone (DESYREL) 150 MG tablet Take 1 tablet (150 mg total) by mouth at bedtime. Qty: 30 tablet, Refills: 0      CONTINUE these medications which have NOT CHANGED   Details  buPROPion (WELLBUTRIN XL) 300 MG 24 hr tablet Take 300 mg by mouth daily.    divalproex (DEPAKOTE ER) 500 MG 24 hr tablet Take 4 tablets (2,000 mg total) by mouth at bedtime. Qty: 120 tablet, Refills: 0    escitalopram (LEXAPRO) 20 MG tablet Take 20 mg by mouth every morning.    esomeprazole (NEXIUM) 40 MG capsule Take 40 mg by mouth daily.     Fluticasone-Salmeterol (ADVAIR) 250-50 MCG/DOSE AEPB Inhale 1 puff into the lungs 2 (two) times daily.    hydrOXYzine (ATARAX/VISTARIL) 50 MG tablet Take 50 mg by mouth 3 (three) times daily as needed for anxiety.    Melatonin 5 MG TABS Take 10 mg by mouth at bedtime.    Multiple Vitamins-Minerals (THEREMS-M) TABS Take 1 tablet by mouth daily.      STOP taking these medications     amLODipine (NORVASC) 2.5 MG tablet      chlordiazePOXIDE (LIBRIUM) 25 MG capsule      meloxicam (MOBIC) 15 MG tablet        No Known Allergies Follow-up Information    Follow up with [place].   Specialty:  Home Health Services   Why:  HHPT, [facility],  aide   Contact information:   [place] [address] [phone]       Follow up with [facility] .   Specialty:  [place] information:   [address] [place] [phone]       The results of significant diagnostics from this hospitalization (including imaging, microbiology,  ancillary and laboratory) are listed below for reference.    Significant Diagnostic Studies: Dg Chest 2 View  12/31/2015  CLINICAL DATA:  Cough for 3 weeks, worsening shortness of breath. EXAM: CHEST  2 VIEW COMPARISON:  None. FINDINGS: There is complete opacity of the right hemi thorax. There is suggestion of air-fluid level/ cavity in the right lung base. The left lung is clear. The evaluation of the mediastinum and heart size are limited due to with loss of mediastinal and cardiac border. The visualized skeletal structures are unremarkable. IMPRESSION: Complete opacity of the right hemi thorax with suggestion of air-fluid level/ cavity in the right lung base, further evaluation with chest CT is recommended. Electronically Signed   By: Abelardo Diesel M.D.   On: 12/31/2015 20:21  Ct Chest W Contrast  12/31/2015  CLINICAL DATA:  Acute onset of cough and shortness of breath. Right-sided pleural effusion. Initial encounter. EXAM: CT CHEST WITH CONTRAST TECHNIQUE: Multidetector CT imaging of the chest was performed during intravenous contrast administration. CONTRAST:  186m OMNIPAQUE IOHEXOL 300 MG/ML  SOLN COMPARISON:  Chest radiograph performed earlier today at 7:55 p.m. FINDINGS: A large loculated right-sided pleural effusion is noted, with surrounding atelectasis. An empyema cannot be excluded. There is a large 6.9 x 6.7 x 4.4 cm complex collection of fluid and air at the right middle lobe, with a prominent surrounding rind. This most likely reflects a lung abscess, though infection of an underlying necrotic mass cannot be excluded. The left lung is relatively clear.  No pneumothorax is seen. A subcarinal node measures up to 1.4 cm in short axis. No additional mediastinal lymphadenopathy is seen. No pericardial effusion is identified. The great vessels are grossly unremarkable in appearance. The visualized portions of thyroid gland are unremarkable. No axillary lymphadenopathy is seen. The visualized portions  of the liver are grossly unremarkable. The spleen appears mildly enlarged. The visualized portions of the pancreas, adrenal glands, kidneys and gallbladder are grossly unremarkable. An 8 mm lymph node is noted adjacent to the distal esophagus. No acute osseous abnormalities are seen. There appears to be a chronic incompletely healed fracture at the upper body of the sternum, with associated sclerosis and mild underlying periosteal change. Underlying infection cannot be excluded. IMPRESSION: 1. Large 6.9 x 6.7 x 4.4 cm complex collection of fluid and air at the right middle lobe, with prominent surrounding rind. This most likely reflects a lung abscess, though infection of an underlying necrotic mass cannot be excluded. Further evaluation is recommended as deemed clinically appropriate. 2. Large loculated right-sided pleural effusion, occupying much of the right lung. Cannot exclude empyema. Surrounding atelectasis noted. 3. Enlarged subcarinal node, measuring 1.4 cm in short axis. Unusual 8 mm node noted adjacent to the distal esophagus. This may simply reflect infection, though malignancy cannot be entirely excluded. 4. Mild splenomegaly. 5. Apparent chronic incompletely healed fracture at the upper body of the sternum, with associated sclerosis and mild underlying periosteal change. Underlying infection cannot be excluded. Electronically Signed   By: JGarald BaldingM.D.   On: 12/31/2015 23:44   Dg Chest Port 1 View  01/09/2016  CLINICAL DATA:  41year old male with a history of right-sided empyema EXAM: PORTABLE CHEST 1 VIEW COMPARISON:  Prior chest x-ray 01/07/2006 FINDINGS: Interval removal of right IJ central venous catheter and right-sided chest tube. No evidence of pneumothorax. Stable cardiac and mediastinal contours. Persistent right cyst mid and lower lung patchy airspace opacities and right inferior pleural thickening. The left lung remains well aerated. No acute osseous abnormality. IMPRESSION: 1.  Interval removal of central venous catheter and right-sided chest tube. 2. No evidence of pneumothorax. 3. Persistent right lower lung pleural thickening and associated right mid and lower lung patchy airspace opacities which may reflect atelectasis or residual infiltrates. Electronically Signed   By: HJacqulynn CadetM.D.   On: 01/09/2016 07:48   Dg Chest Port 1 View  01/08/2016  CLINICAL DATA:  Chest tube removal EXAM: PORTABLE CHEST 1 VIEW COMPARISON:  01/07/2016 FINDINGS: Cardiomediastinal silhouette is stable. There is right chest tube stable in position. The second more medial right chest tube has been removed. There is no pneumothorax. Persistent small right pleural effusion with right basilar atelectasis or infiltrate. Stable right IJ central line position. IMPRESSION: Stable right chest tube position.  The second right chest tube has been removed. No pneumothorax. Right IJ central line is stable in position. Again noted small pleural effusion with right basilar atelectasis or infiltrate. Electronically Signed   By: Lahoma Crocker M.D.   On: 01/08/2016 08:25   Dg Chest Port 1 View  01/07/2016  CLINICAL DATA:  Empyema EXAM: PORTABLE CHEST 1 VIEW COMPARISON:  Yesterday FINDINGS: Right jugular venous catheter stable. Right chest tubes stable. Airspace disease in the right mid and lower lung zone is stable. Minimal atelectasis at the left base is stable. Normal heart size. No pneumothorax. Right pleural effusions stable. IMPRESSION: Stable. Right pleural and parenchymal disease with chest tubes in place and no pneumothorax. Electronically Signed   By: Marybelle Killings M.D.   On: 01/07/2016 08:12   Dg Chest Port 1 View  01/06/2016  CLINICAL DATA:  Follow-up empyema with chest tube drainage EXAM: PORTABLE CHEST 1 VIEW COMPARISON:  Portable chest x-ray of January 05, 2016. FINDINGS: The left lung is adequately inflated and clear. On the right there is persistent increased density in the mid and lower lung. The 2  chest tubes are present and stable. Pleural thickening or pleural fluid extends over the lateral convexity of the aerated right lung. The heart is normal in size. The pulmonary vascularity is not engorged. The right internal jugular venous catheter tip projects over the midportion of the SVC. IMPRESSION: Persistent airspace opacities on the right consistent with edema or pneumonia. A moderate-size residual pleural effusion on the right is suspected though this may be loculated. There is no pneumothorax or pneumomediastinum. The chest tubes are in stable position. On the left no significant pulmonary parenchymal abnormality is observed. Electronically Signed   By: David  Martinique M.D.   On: 01/06/2016 08:01   Dg Chest Port 1 View  01/05/2016  CLINICAL DATA:  Follow-up pneumothorax. EXAM: PORTABLE CHEST 1 VIEW COMPARISON:  Portable chest x-ray of January 04, 2016. FINDINGS: On the right there is persistent increased density along the periphery of the long and at the right lung base consistent with pleural fluid or pleural thickening. No pneumothorax or pneumomediastinum is evident. There is persistent increased interstitial density in the right lung base which is less conspicuous today. The right-sided chest tubes are in stable position. There is no mediastinal shift. The left lung is clear. The heart and pulmonary vascularity are normal. The right internal jugular venous catheter tip projects over the midportion of the SVC. IMPRESSION: 1. Improving appearance of the interstitium at the right lung base consistent with resolving atelectasis or infiltrate. There is stable pleural thickening or pleural fluid surrounding the convexity of the right lung with a small amount of the right lung base. 2. The low left lung and the heart and pulmonary vascularity are normal. 3. Stable appearance of the 2 chest tubes and the right internal jugular venous catheter. Electronically Signed   By: David  Martinique M.D.   On: 01/05/2016  08:11   Dg Chest Port 1 View  01/04/2016  CLINICAL DATA:  Empyema, history hypertension EXAM: PORTABLE CHEST 1 VIEW COMPARISON:  Portable exam 0629 hours compared 01/03/2016 FINDINGS: Tip of RIGHT jugular line projects over cavoatrial junction. Pair of RIGHT thoracostomy tubes again identified. Stable heart size and mediastinal contours. Persistent pleural fluid versus thickening RIGHT hemi thorax with atelectasis of mid to lower RIGHT lung and questionable RIGHT lower lobe infiltrate. LEFT lung clear. No pneumothorax. IMPRESSION: Persistent RIGHT pleural effusion versus thickening. Persistent atelectasis versus consolidation of mid to lower  RIGHT lung. No significant interval change. Electronically Signed   By: Lavonia Dana M.D.   On: 01/04/2016 08:00   Dg Chest Port 1 View  01/03/2016  CLINICAL DATA:  Cough for 2 weeks.  Recent pneumothorax EXAM: PORTABLE CHEST 1 VIEW COMPARISON:  January 02, 2016 chest radiograph and chest CT December 31, 2015 FINDINGS: Chest tubes remain on the right. Small right apical pneumothorax is stable. No tension component. Central catheter tip is at the cavoatrial junction. The apparent abscess in the right middle lobe is again noted with air collection surrounded by fluid, unchanged. There apparent layering effusion on the right. No new opacity is identified on the right compared to 1 day prior. Left lung is clear. Heart size is within normal limits. The pulmonary vascularity appears unremarkable. No adenopathy is demonstrable by radiography. IMPRESSION: Small pneumothorax on the right is stable. No tension component. No change in tube and catheter positions. Pleural effusion on the right is felt to be stable as is apparent right middle lobe abscess. Left lung clear. No change in cardiac silhouette. Electronically Signed   By: Lowella Grip III M.D.   On: 01/03/2016 08:46   Dg Chest Port 1 View  01/02/2016  CLINICAL DATA:  Right pneumothorax EXAM: PORTABLE CHEST 1 VIEW  COMPARISON:  Two days ago FINDINGS: Small right apical pneumothorax, 10% or less. Right chest drains in place. Opacity at the right base from pleural thickening and/or residual fluid. Right middle lobe abscess as seen on chest CT 12/31/2015. Right IJ central line, tip at the lower SVC level. Clear left lung. IMPRESSION: 1. Small right pneumothorax, less than 10%. 2. Right middle lobe abscess and treated empyema. Electronically Signed   By: Monte Fantasia M.D.   On: 01/02/2016 13:43    Microbiology: Recent Results (from the past 240 hour(s))  Surgical pcr screen     Status: None   Collection Time: 01/02/16  8:07 AM  Result Value Ref Range Status   MRSA, PCR NEGATIVE NEGATIVE Final   Staphylococcus aureus NEGATIVE NEGATIVE Final    Comment:        The Xpert SA Assay (FDA approved for NASAL specimens in patients over 63 years of age), is one component of a comprehensive surveillance program.  Test performance has been validated by [facility] for patients greater than or equal to 75 year old. It is not intended to diagnose infection nor to guide or monitor treatment.   Culture, respiratory (NON-Expectorated)     Status: None   Collection Time: 01/02/16 10:19 AM  Result Value Ref Range Status   Specimen Description BRONCHIAL WASHINGS  Final   Special Requests POF ZOSYN 3.375 PER M.GENGLER ORDER RESP CULTURE  Final   Gram Stain   Final    MODERATE WBC PRESENT, PREDOMINANTLY PMN NO SQUAMOUS EPITHELIAL CELLS SEEN NO ORGANISMS SEEN Performed at [facility]    Culture   Final    NORMAL OROPHARYNGEAL FLORA Performed at [facility]    Report Status 01/05/2016 FINAL  Final  Anaerobic culture     Status: None   Collection Time: 01/02/16 11:27 AM  Result Value Ref Range Status   Specimen Description TISSUE RIGHT PLEURAL  Final   Special Requests RIGHT PLEURAL PEEL POF ZOSYN  Final   Gram Stain   Final    ABUNDANT WBC PRESENT,BOTH PMN AND MONONUCLEAR FEW SQUAMOUS  EPITHELIAL CELLS PRESENT FEW GRAM POSITIVE COCCI IN CLUSTERS Performed at [facility]  Final    NO ANAEROBES ISOLATED Performed at [facility]    Report Status 01/07/2016 FINAL  Final  Tissue culture     Status: None   Collection Time: 01/02/16 11:27 AM  Result Value Ref Range Status   Specimen Description TISSUE RIGHT PLEURAL  Final   Special Requests RIGHT PLEU PEEL POF ZOSYN  Final   Gram Stain   Final    ABUNDANT WBC PRESENT,BOTH PMN AND MONONUCLEAR FEW SQUAMOUS EPITHELIAL CELLS PRESENT FEW GRAM POSITIVE COCCI IN CLUSTERS Performed at [facility]    Culture   Final    ABUNDANT MICROAEROPHILIC STREPTOCOCCI Note: Standardized susceptibility testing for this organism is not available. Performed at [facility]    Report Status 01/06/2016 FINAL  Final  Culture, body fluid-bottle     Status: None   Collection Time: 01/02/16 11:47 AM  Result Value Ref Range Status   Specimen Description FLUID RIGHT PLEURAL  Final   Special Requests NONE  Final   Culture NO GROWTH 5 DAYS  Final   Report Status 01/07/2016 FINAL  Final  Gram stain     Status: None   Collection Time: 01/02/16 11:47 AM  Result Value Ref Range Status   Specimen Description FLUID RIGHT PLEURAL  Final   Special Requests NONE  Final   Gram Stain   Final    FEW WBC PRESENT, PREDOMINANTLY PMN NO ORGANISMS SEEN    Report Status 01/02/2016 FINAL  Final  Anaerobic culture     Status: None   Collection Time: 01/02/16 11:57 AM  Result Value Ref Range Status   Specimen Description TISSUE RIGHT  Final   Special Requests RIGHT VISCERAL PEEL POF ZOSYN  Final   Gram Stain   Final    MODERATE WBC PRESENT,BOTH PMN AND MONONUCLEAR NO SQUAMOUS EPITHELIAL CELLS SEEN NO ORGANISMS SEEN Performed at [facility]    Culture   Final    NO ANAEROBES ISOLATED Performed at [facility]    Report Status 01/07/2016 FINAL  Final  Tissue culture     Status: None    Collection Time: 01/02/16 11:57 AM  Result Value Ref Range Status   Specimen Description TISSUE RIGHT  Final   Special Requests RIGHT VISCERAL PEEL POF ZOSYN  Final   Gram Stain   Final    MODERATE WBC PRESENT,BOTH PMN AND MONONUCLEAR NO SQUAMOUS EPITHELIAL CELLS SEEN NO ORGANISMS SEEN Performed at [facility]    Culture   Final    FEW MICROAEROPHILIC STREPTOCOCCI Note: Standardized susceptibility testing for this organism is not available. Performed at [facility]    Report Status 01/06/2016 FINAL  Final     Labs: Basic Metabolic Panel:  Recent Labs Lab 01/06/16 0520 01/07/16 0509 01/08/16 0330 01/09/16 0436 01/10/16 0237  NA 138 136 134* 136 137  K 4.9 5.0 5.1 5.1 4.7  CL 102 102 101 106 104  CO2 _0 GLUCOSE 102* 90 120* 94 106*  BUN 29* 24* 23* 22* 20  CREATININE 2.55* 2.27* 2.21* 2.12* 2.11*  CALCIUM 7.6* 7.7* 7.7* 7.9* 8.1*   Liver Function Tests: No results for input(s): AST, ALT, ALKPHOS, BILITOT, PROT, ALBUMIN in the last 168 hours. No results for input(s): LIPASE, AMYLASE in the last 168 hours. No results for input(s): AMMONIA in the last 168 hours. CBC:  Recent Labs Lab 01/05/16 0430 01/06/16 0520 01/07/16 0509 01/09/16 0436 01/10/16 0237  WBC 15.8* 11.6* 10.0 10.3 8.8  HGB 9.0* 8.3* 8.1* 7.9* 7.9*  HCT 27.1* 25.2* 25.2* 24.7* 25.1*  MCV 88.3 92.3 91.3 92.5 93.3  PLT 277 261 270 303 277   Cardiac Enzymes: No results for input(s): CKTOTAL, CKMB, CKMBINDEX, TROPONINI in the last 168 hours. BNP: BNP (last 3 results)  Recent Labs  12/31/15 2000  BNP 62.6    ProBNP (last 3 results) No results for input(s): PROBNP in the last 8760 hours.  CBG:  Recent Labs Lab 01/07/16 0733  GLUCAP 108*       Signed:  Niel Hummer A MD.  [facility] 01/11/2016, 1:56 PM

## 2016-01-11 NOTE — Care Management Note (Signed)
Case Management Note Previous CM note initiated by Letha Cape RN, CM  Patient Details  Name: [name] MRN: [id number] Date of Birth: 1976-05-01  Subjective/Objective:    NCM received message from [place] with [facility], she was checking with [facility] facility to see if they were able to do IV ABX if needed, the Rep from [facility] states they are not capable to do IV  ABX, they are a low income facility, they do not have the staff for that, but [place] with [facility] states if he needs a daily iv abx for 7 days or less they can possibly send a HHRN out to do it ( their RN would have to come daily , so it would have to be  once a day).  Patient does not have a family member who can do this for him, he has a legal guardian listed as contact.   If patient will need a longer time than 7 days he will more than likely need to go to a SNF.   Action/Plan: Pt S/P VATS- plan to return to ALF with [facility] services-   Expected Discharge Date:     2/20/.17             Expected Discharge Plan:  Assisted Living / Rest Home  In-House Referral:  Clinical Social Work  Discharge planning Services  CM Consult  Post Acute Care Choice:  Home Health Choice offered to:  NA  DME Arranged:    DME Agency:     HH Arranged:  PT HH Agency:  [facility]  Status of Service:  Completed, signed off  Medicare Important Message Given:  Yes Date Medicare IM Given:    Medicare IM give by:    Date Additional Medicare IM Given:    Additional Medicare Important Message give by:     If discussed at Long Length of Stay Meetings, dates discussed:    Discharge Disposition: Assisted Living with [facility]   Additional Comments:  01/11/16- 1430- Donn Pierini RN, BSN- pt for d/c back to ALF today- with HHPT- pt will d/c on po abx so will not need HHRN- orders placed for HHPT- spoke with Abby at [facility]- who ALF contracts with for [facility] services- faxed order and needed paperwork vis epic to Abby at [phone]-  CSW assisting with d/c back to ALF.   Darrold Span, RN 01/11/2016, 2:30 PM

## 2016-01-11 NOTE — Clinical Social Work Note (Signed)
Clinical Social Work Assessment  Patient Details  Name: [name] MRN: [id number] Date of Birth: 12-16-1975  Date of referral:  01/11/16               Reason for consult:  Facility Placement                Permission sought to share information with:  Facility Medical sales representative, Guardian Permission granted to share information::  Yes, Verbal Permission Granted  Name::     [name]  Agency::  [facility]  Relationship::  Legal Guardian  Contact Information:  [phone]  Housing/Transportation Living arrangements for the past 2 months:  Assisted Living Facility Source of Information:  Patient, Guardian Patient Interpreter Needed:  None Criminal Activity/Legal Involvement Pertinent to Current Situation/Hospitalization:  No - Comment as needed Significant Relationships:  None Lives with:  Self Do you feel safe going back to the place where you live?  Yes Need for family participation in patient care:  No (Coment)  Care giving concerns:  CSW spoke w/ patient and patient's Legal Guardian, [name] [facility]). Patient is from [facility] and plans to return there by PTAR.   Social Worker assessment / plan:  Patient will return to [facility].  Employment status:  Disabled (Comment on whether or not currently receiving Disability) Insurance information:  Medicare PT Recommendations:  Not assessed at this time Information / Referral to community resources:     Patient/Family's Response to care:  Patient is in agreement with plan for discharge.  Patient/Family's Understanding of and Emotional Response to Diagnosis, Current Treatment, and Prognosis:  No questions/concerns.  Emotional Assessment Appearance:  Appears older than stated age Attitude/Demeanor/Rapport:   (Appropriate) Affect (typically observed):  Appropriate, Quiet Orientation:  Oriented to Self, Oriented to Place, Oriented to  Time, Oriented to Situation Alcohol / Substance use:  Not  Applicable Psych involvement (Current and /or in the community):  No (Comment)  Discharge Needs  Concerns to be addressed:  Care Coordination Readmission within the last 30 days:  No Current discharge risk:  None Barriers to Discharge:  Continued Medical Work up   [employer], LCSWA 01/11/2016, 11:26 AM

## 2016-01-13 ENCOUNTER — Emergency Department (HOSPITAL_COMMUNITY): Payer: Medicare Other

## 2016-01-13 ENCOUNTER — Emergency Department (HOSPITAL_COMMUNITY)
Admission: EM | Admit: 2016-01-13 | Discharge: 2016-01-14 | Disposition: A | Discharge status: Home / Self Care | Payer: Medicare Other | Attending: Emergency Medicine | Admitting: Emergency Medicine

## 2016-01-13 DIAGNOSIS — R079 Chest pain, unspecified: Secondary | ICD-10-CM | POA: Diagnosis present

## 2016-01-13 DIAGNOSIS — J90 Pleural effusion, not elsewhere classified: Secondary | ICD-10-CM

## 2016-01-13 DIAGNOSIS — Z79899 Other long term (current) drug therapy: Secondary | ICD-10-CM | POA: Diagnosis not present

## 2016-01-13 DIAGNOSIS — R0602 Shortness of breath: Secondary | ICD-10-CM | POA: Insufficient documentation

## 2016-01-13 DIAGNOSIS — J869 Pyothorax without fistula: Secondary | ICD-10-CM

## 2016-01-13 DIAGNOSIS — Z7951 Long term (current) use of inhaled steroids: Secondary | ICD-10-CM | POA: Diagnosis not present

## 2016-01-13 DIAGNOSIS — F17210 Nicotine dependence, cigarettes, uncomplicated: Secondary | ICD-10-CM | POA: Diagnosis not present

## 2016-01-13 DIAGNOSIS — K219 Gastro-esophageal reflux disease without esophagitis: Secondary | ICD-10-CM | POA: Diagnosis not present

## 2016-01-13 DIAGNOSIS — F419 Anxiety disorder, unspecified: Secondary | ICD-10-CM | POA: Insufficient documentation

## 2016-01-13 DIAGNOSIS — Z9889 Other specified postprocedural states: Secondary | ICD-10-CM | POA: Diagnosis not present

## 2016-01-13 DIAGNOSIS — I10 Essential (primary) hypertension: Secondary | ICD-10-CM | POA: Insufficient documentation

## 2016-01-13 DIAGNOSIS — Z792 Long term (current) use of antibiotics: Secondary | ICD-10-CM | POA: Diagnosis not present

## 2016-01-13 DIAGNOSIS — F329 Major depressive disorder, single episode, unspecified: Secondary | ICD-10-CM | POA: Diagnosis not present

## 2016-01-13 LAB — I-STAT TROPONIN, ED: Troponin i, poc: 0 ng/mL (ref 0.00–0.08)

## 2016-01-13 LAB — CBC WITH DIFFERENTIAL/PLATELET
Basophils Absolute: 0 10*3/uL (ref 0.0–0.1)
Basophils Relative: 0 %
EOS ABS: 0.2 10*3/uL (ref 0.0–0.7)
EOS PCT: 2 %
HCT: 23 % — ABNORMAL LOW (ref 39.0–52.0)
Hemoglobin: 7.5 g/dL — ABNORMAL LOW (ref 13.0–17.0)
LYMPHS ABS: 1.7 10*3/uL (ref 0.7–4.0)
LYMPHS PCT: 19 %
MCH: 30.1 pg (ref 26.0–34.0)
MCHC: 32.6 g/dL (ref 30.0–36.0)
MCV: 92.4 fL (ref 78.0–100.0)
MONO ABS: 1 10*3/uL (ref 0.1–1.0)
MONOS PCT: 11 %
Neutro Abs: 6 10*3/uL (ref 1.7–7.7)
Neutrophils Relative %: 68 %
PLATELETS: 335 10*3/uL (ref 150–400)
RBC: 2.49 MIL/uL — ABNORMAL LOW (ref 4.22–5.81)
RDW: 15.5 % (ref 11.5–15.5)
WBC: 8.9 10*3/uL (ref 4.0–10.5)

## 2016-01-13 LAB — BASIC METABOLIC PANEL
Anion gap: 8 (ref 5–15)
BUN: 16 mg/dL (ref 6–20)
CALCIUM: 8 mg/dL — AB (ref 8.9–10.3)
CO2: 26 mmol/L (ref 22–32)
CREATININE: 1.82 mg/dL — AB (ref 0.61–1.24)
Chloride: 99 mmol/L — ABNORMAL LOW (ref 101–111)
GFR calc Af Amer: 52 mL/min — ABNORMAL LOW (ref >60)
GFR, EST NON AFRICAN AMERICAN: 45 mL/min — AB (ref >60)
GLUCOSE: 94 mg/dL (ref 65–99)
POTASSIUM: 3.8 mmol/L (ref 3.5–5.1)
Sodium: 133 mmol/L — ABNORMAL LOW (ref 135–145)

## 2016-01-13 MED ORDER — OXYCODONE-ACETAMINOPHEN 5-325 MG PO TABS
2.0000 | ORAL_TABLET | Freq: Once | ORAL | Status: AC
  Administered 2016-01-13: 2 via ORAL
  Filled 2016-01-13: qty 2

## 2016-01-13 MED ORDER — OXYCODONE HCL 5 MG PO TABS
5.0000 mg | ORAL_TABLET | Freq: Once | ORAL | Status: AC
Start: 2016-01-13 — End: 2016-01-13
  Administered 2016-01-13: 5 mg via ORAL
  Filled 2016-01-13: qty 1

## 2016-01-13 MED ORDER — OXYCODONE-ACETAMINOPHEN 5-325 MG PO TABS
1.0000 | ORAL_TABLET | Freq: Once | ORAL | Status: DC
Start: 2016-01-13 — End: 2016-01-13

## 2016-01-13 MED ORDER — OXYCODONE-ACETAMINOPHEN 5-325 MG PO TABS: 1.0000 | ORAL_TABLET | Freq: Once | ORAL | Status: DC

## 2016-01-13 NOTE — ED Notes (Signed)
Report called to Bard Herbert, Charity fundraiser at [facility] assisted living facility.

## 2016-01-13 NOTE — ED Notes (Signed)
Attempted IV access and blood without success.  Will have another RN try.

## 2016-01-13 NOTE — ED Notes (Signed)
Susy Frizzle, RN attempting [place] IV.

## 2016-01-13 NOTE — ED Notes (Signed)
Pt is from [facility] and had a thoracentesis done on his R side last Sunday that is causing him pain. Taking percocet and tramadol at home without relief. Alert and oriented.

## 2016-01-13 NOTE — ED Notes (Signed)
Unable to obtain blood or IV access.  Will let  MD know.

## 2016-01-13 NOTE — ED Provider Notes (Signed)
CSN: [id number]     Arrival date & time 01/13/16  1828 History   First MD Initiated Contact with Patient 01/13/16 1841     Chief Complaint  Patient presents with  . Post-op Problem     (Consider location/radiation/quality/duration/timing/severity/associated sxs/prior Treatment) The history is provided by the patient and medical records.    40 year old male with history of depression, anxiety, hypertension, GERD, seizures, presenting to the ED for right-sided chest pain and shortness of breath.  Patient was seen in the ED approximately 2 weeks ago for shortness of breath and was found to have a large right-sided pleural effusion which required thoracentesis. He states since discharge from the hospital 2 days ago he has continued to have pain on his right side, mostly in the ribs. He reports he continues to feel short of breath and has some fatigue. He states he lives in a group home and his room is on a hallway by himself which makes it difficult for him to get the nurses attention. He denies any nausea or vomiting but states he has had poor oral intake as he has no appetite. He has had some diarrhea as well. He denies any fever or chills. He has been taking his Percocet and tramadol at home without relief. He states he has used the incentive spirometer, does not feel it is necessarily helping.  He is currently on augmentin for the next 2 weeks. VSS.  Past Medical History  Diagnosis Date  . Depression   . Anxiety   . Hypertension   . GERD (gastroesophageal reflux disease)   . Seizures [facility])    Past Surgical History  Procedure Laterality Date  . Finger surgery Left     5th digit  . Video bronchoscopy N/A 01/02/2016    Procedure: VIDEO BRONCHOSCOPY;  Surgeon: Loreli Slot, MD;  Location: [facility] OR;  Service: Thoracic;  Laterality: N/A;  . Video assisted thoracoscopy (vats)/decortication Right 01/02/2016    Procedure: VIDEO ASSISTED THORACOSCOPY (VATS)/DECORTICATION;  Surgeon: Loreli Slot, MD;  Location: [facility] OR;  Service: Thoracic;  Laterality: Right;  . Pleural effusion drainage Right 01/02/2016    Procedure: DRAINAGE OF PLEURAL EFFUSION;  Surgeon: Loreli Slot, MD;  Location: [facility] OR;  Service: Thoracic;  Laterality: Right;   Family History  Problem Relation Age of Onset  . CAD Mother   . Diabetes Mellitus II Father    Social History  Substance Use Topics  . Smoking status: Current Every Day Smoker -- 0.50 packs/day    Types: Cigarettes  . Smokeless tobacco: Not on file  . Alcohol Use: No     Comment: pt states he no longer drinks    Review of Systems  Cardiovascular: Positive for chest pain (right ribs).  All other systems reviewed and are negative.     Allergies  Review of patient's allergies indicates no known allergies.  Home Medications   Prior to Admission medications   Medication Sig Start Date End Date Taking? Authorizing Provider  amoxicillin-clavulanate (AUGMENTIN) 875-125 MG tablet Take 1 tablet by mouth 2 (two) times daily. 01/11/16   Belkys A Regalado, MD  buPROPion (WELLBUTRIN XL) 300 MG 24 hr tablet Take 300 mg by mouth daily.    Historical Provider, MD  clonazePAM (KLONOPIN) 1 MG tablet Take 1 tablet (1 mg total) by mouth 2 (two) times daily. 01/11/16   Belkys A Regalado, MD  divalproex (DEPAKOTE ER) 500 MG 24 hr tablet Take 4 tablets (2,000 mg total) by mouth at bedtime.  11/13/15   Jimmy Footman, MD  escitalopram (LEXAPRO) 20 MG tablet Take 20 mg by mouth every morning.    Historical Provider, MD  esomeprazole (NEXIUM) 40 MG capsule Take 40 mg by mouth daily.     Historical Provider, MD  ferrous sulfate 325 (65 FE) MG tablet Take 1 tablet (325 mg total) by mouth 2 (two) times daily with a meal. 01/11/16   Belkys A Regalado, MD  Fluticasone-Salmeterol (ADVAIR) 250-50 MCG/DOSE AEPB Inhale 1 puff into the lungs 2 (two) times daily.    Historical Provider, MD  hydrOXYzine (ATARAX/VISTARIL) 50 MG tablet Take 50 mg by mouth  3 (three) times daily as needed for anxiety.    Historical Provider, MD  Melatonin 5 MG TABS Take 10 mg by mouth at bedtime.    Historical Provider, MD  Multiple Vitamins-Minerals (THEREMS-M) TABS Take 1 tablet by mouth daily.    Historical Provider, MD  oxyCODONE (OXY IR/ROXICODONE) 5 MG immediate release tablet Take 1 tablet (5 mg total) by mouth every 4 (four) hours as needed for severe pain. 01/11/16   Belkys A Regalado, MD  traMADol (ULTRAM) 50 MG tablet Take 1 tablet (50 mg total) by mouth every 8 (eight) hours as needed for moderate pain. 01/11/16   Belkys A Regalado, MD  traZODone (DESYREL) 150 MG tablet Take 1 tablet (150 mg total) by mouth at bedtime. 01/11/16   Belkys A Regalado, MD   BP 146/118 mmHg  Pulse 64  Temp(Src) 99.1 F (37.3 C) (Oral)  Resp 18  SpO2 95%   Physical Exam  Constitutional: He is oriented to person, place, and time. He appears well-developed and well-nourished. No distress.  Appears much older than actual age  HENT:  Head: Normocephalic and atraumatic.  Mouth/Throat: Oropharynx is clear and moist.  Eyes: Conjunctivae and EOM are normal. Pupils are equal, round, and reactive to light.  Neck: Normal range of motion. Neck supple.  Cardiovascular: Normal rate, regular rhythm and normal heart sounds.   Pulmonary/Chest: Effort normal. No respiratory distress. He has decreased breath sounds (mild on right). He has no wheezes.  Surgical incisions present on right lateral chest with sutures intact; no signs of infection currently; area is locally tender to palpation; lungs overall clear bilaterally, slightly diminished on right  Abdominal: Soft. Bowel sounds are normal. There is no tenderness. There is no guarding.  Musculoskeletal: Normal range of motion. He exhibits no edema.  Neurological: He is alert and oriented to person, place, and time.  Skin: Skin is warm and dry. He is not diaphoretic.  Psychiatric: He has a normal mood and affect.  Nursing note and vitals  reviewed.   ED Course  Procedures (including critical care time) Labs Review Labs Reviewed  CBC WITH DIFFERENTIAL/PLATELET - Abnormal; Notable for the following:    RBC 2.49 (*)    Hemoglobin 7.5 (*)    HCT 23.0 (*)    All other components within normal limits  BASIC METABOLIC PANEL - Abnormal; Notable for the following:    Sodium 133 (*)    Chloride 99 (*)    Creatinine, Ser 1.82 (*)    Calcium 8.0 (*)    GFR calc non Af Amer 45 (*)    GFR calc Af Amer 52 (*)    All other components within normal limits  Rosezena Sensor, ED    Imaging Review Dg Chest 2 View  01/13/2016  CLINICAL DATA:  Right chest pain. The patient was discharged from the hospital 01/11/2016 after admission and  treatment for right empyema. Initial encounter. EXAM: CHEST  2 VIEW COMPARISON:  CT chest 12/31/2015. Single view of the chest 01/09/2016. FINDINGS: There is a moderate to moderately large right pleural effusion which includes a loculated component on the lateral view of the posterior chest. There is airspace disease on the right. Aeration in the right chest appears improved since the most recent exam. The left lung is clear. No pneumothorax is identified. Heart size is normal. IMPRESSION: Aeration in the right chest appears improved since the most recent comparison study but there is a persistent loculated appearing right pleural fluid collection in the posterior right chest. Negative for pneumothorax. Electronically Signed   By: Drusilla Kanner M.D.   On: 01/13/2016 19:53   I have personally reviewed and evaluated these images and lab results as part of my medical decision-making.   EKG Interpretation   Date/Time:  Wednesday January 13 2016 19:25:14 EST Ventricular Rate:  72 PR Interval:  271 QRS Duration: 72 QT Interval:  376 QTC Calculation: 411 R Axis:   69 Text Interpretation:  Sinus or ectopic atrial rhythm Prolonged PR interval  Consider RVH or posterior infarct Baseline wander in lead(s) V1  V2  Confirmed by DELO  MD, DOUGLAS ([id number]) on 01/13/2016 7:52:33 PM      MDM   Final diagnoses:  Pleural effusion, right  Right-sided chest pain   40 year old male here with right-sided chest and rib pain after thoracentesis and chest tube placement for large right-sided pleural effusion. He was just discharged from the hospital 2 days ago. Patient is afebrile, nontoxic. He is in no acute distress. His vital signs are stable on room air. His surgical incisions along his right lateral ribs and chest appear to be healing well without any signs of infection currently. His lungs are overall clear, slightly diminished on right. Will obtain repeat chest x-ray and labs. EKG without any acute ischemic changes.  Chest x-ray with improved aeration of the right chest, does have a persistent fluid collection in the right posterior chest. Labwork is overall reassuring, appears unchanged from a few days ago. Suspect patient's continued pain is due to his procedures as well as persistent small right pleural effusion. Do not feel he needs repeat admission at this time. Will discharge back to facility. He is to follow-up with CT surgery in clinic in 2 weeks for recheck and suture removal. He will continue his home on augmentin and pain medications as needed.  Discussed plan with patient, he/she acknowledged understanding and agreed with plan of care.  Return precautions given for new or worsening symptoms.  Case discussed with attending physician, Dr. Judd Lien, who agrees with assessment and plan of care.  Garlon Hatchet, PA-C 01/14/16 0019  Garlon Hatchet, PA-C 01/14/16 0025  Geoffery Lyons, MD 01/16/16 [phone]

## 2016-01-13 NOTE — ED Notes (Signed)
Bed: NW29 Expected date:  Expected time:  Means of arrival:  Comments: 62M/incision pain

## 2016-01-13 NOTE — ED Notes (Signed)
Patient ambulatory to and from restroom.

## 2016-01-13 NOTE — Discharge Instructions (Signed)
You may continue to have some pain until the fluid in your right chest fully resolves. Continue your home antibiotics and pain medications as prescribed. Follow-up with Dr. Dorris Fetch-- call to make appt. Return to the ED for new or worsening symptoms.

## 2016-01-14 NOTE — ED Notes (Signed)
Patient d/c'd self care.  F/U and medications reviewed with patient and Daphne, Charity fundraiser at [facility] care.  Patient verbalized understanding.

## 2016-01-20 ENCOUNTER — Emergency Department (HOSPITAL_COMMUNITY)
Admission: EM | Admit: 2016-01-20 | Discharge: 2016-01-21 | Disposition: A | Discharge status: Home / Self Care | Payer: Medicare Other | Attending: Emergency Medicine | Admitting: Emergency Medicine

## 2016-01-20 ENCOUNTER — Emergency Department (HOSPITAL_COMMUNITY): Payer: Medicare Other

## 2016-01-20 ENCOUNTER — Encounter (HOSPITAL_COMMUNITY): Payer: Self-pay | Admitting: *Deleted

## 2016-01-20 DIAGNOSIS — R079 Chest pain, unspecified: Secondary | ICD-10-CM | POA: Insufficient documentation

## 2016-01-20 DIAGNOSIS — F329 Major depressive disorder, single episode, unspecified: Secondary | ICD-10-CM | POA: Diagnosis not present

## 2016-01-20 DIAGNOSIS — R6883 Chills (without fever): Secondary | ICD-10-CM | POA: Diagnosis not present

## 2016-01-20 DIAGNOSIS — F17210 Nicotine dependence, cigarettes, uncomplicated: Secondary | ICD-10-CM | POA: Insufficient documentation

## 2016-01-20 DIAGNOSIS — R05 Cough: Secondary | ICD-10-CM | POA: Diagnosis not present

## 2016-01-20 DIAGNOSIS — I10 Essential (primary) hypertension: Secondary | ICD-10-CM | POA: Insufficient documentation

## 2016-01-20 DIAGNOSIS — K219 Gastro-esophageal reflux disease without esophagitis: Secondary | ICD-10-CM | POA: Insufficient documentation

## 2016-01-20 DIAGNOSIS — G8918 Other acute postprocedural pain: Secondary | ICD-10-CM | POA: Insufficient documentation

## 2016-01-20 DIAGNOSIS — F419 Anxiety disorder, unspecified: Secondary | ICD-10-CM | POA: Insufficient documentation

## 2016-01-20 DIAGNOSIS — G40909 Epilepsy, unspecified, not intractable, without status epilepticus: Secondary | ICD-10-CM | POA: Insufficient documentation

## 2016-01-20 DIAGNOSIS — Z7951 Long term (current) use of inhaled steroids: Secondary | ICD-10-CM | POA: Insufficient documentation

## 2016-01-20 DIAGNOSIS — R0602 Shortness of breath: Secondary | ICD-10-CM | POA: Diagnosis present

## 2016-01-20 DIAGNOSIS — Z792 Long term (current) use of antibiotics: Secondary | ICD-10-CM | POA: Insufficient documentation

## 2016-01-20 DIAGNOSIS — Z79899 Other long term (current) drug therapy: Secondary | ICD-10-CM | POA: Insufficient documentation

## 2016-01-20 LAB — CBC WITH DIFFERENTIAL/PLATELET
Basophils Absolute: 0.1 10*3/uL (ref 0.0–0.1)
Basophils Relative: 1 %
EOS ABS: 0.2 10*3/uL (ref 0.0–0.7)
EOS PCT: 3 %
HCT: 25.3 % — ABNORMAL LOW (ref 39.0–52.0)
HEMOGLOBIN: 8 g/dL — AB (ref 13.0–17.0)
LYMPHS ABS: 1.9 10*3/uL (ref 0.7–4.0)
Lymphocytes Relative: 31 %
MCH: 29.1 pg (ref 26.0–34.0)
MCHC: 31.6 g/dL (ref 30.0–36.0)
MCV: 92 fL (ref 78.0–100.0)
MONOS PCT: 10 %
Monocytes Absolute: 0.6 10*3/uL (ref 0.1–1.0)
Neutro Abs: 3.5 10*3/uL (ref 1.7–7.7)
Neutrophils Relative %: 55 %
PLATELETS: 310 10*3/uL (ref 150–400)
RBC: 2.75 MIL/uL — ABNORMAL LOW (ref 4.22–5.81)
RDW: 15.2 % (ref 11.5–15.5)
WBC: 6.2 10*3/uL (ref 4.0–10.5)

## 2016-01-20 LAB — BASIC METABOLIC PANEL
Anion gap: 11 (ref 5–15)
BUN: 5 mg/dL — AB (ref 6–20)
CHLORIDE: 101 mmol/L (ref 101–111)
CO2: 26 mmol/L (ref 22–32)
CREATININE: 1.24 mg/dL (ref 0.61–1.24)
Calcium: 8.8 mg/dL — ABNORMAL LOW (ref 8.9–10.3)
GFR calc Af Amer: >60 mL/min (ref >60)
GFR calc non Af Amer: >60 mL/min (ref >60)
Glucose, Bld: 81 mg/dL (ref 65–99)
Potassium: 4 mmol/L (ref 3.5–5.1)
Sodium: 138 mmol/L (ref 135–145)

## 2016-01-20 MED ORDER — HYDROCODONE-ACETAMINOPHEN 5-325 MG PO TABS: 1.0000 | ORAL_TABLET | ORAL | Status: DC | PRN

## 2016-01-20 MED ORDER — IOHEXOL 350 MG/ML SOLN
100.0000 mL | Freq: Once | INTRAVENOUS | Status: AC | PRN
  Administered 2016-01-20: 100 mL via INTRAVENOUS

## 2016-01-20 MED ORDER — ALBUTEROL SULFATE HFA 108 (90 BASE) MCG/ACT IN AERS
2.0000 | INHALATION_SPRAY | RESPIRATORY_TRACT | Status: DC | PRN
  Administered 2016-01-20: 2 via RESPIRATORY_TRACT
  Filled 2016-01-20: qty 6.7

## 2016-01-20 MED ORDER — AEROCHAMBER PLUS W/MASK MISC
1.0000 | Freq: Once | Status: AC
  Administered 2016-01-20: 1
  Filled 2016-01-20: qty 1

## 2016-01-20 MED ORDER — ALBUTEROL SULFATE (2.5 MG/3ML) 0.083% IN NEBU
5.0000 mg | INHALATION_SOLUTION | Freq: Once | RESPIRATORY_TRACT | Status: AC
  Administered 2016-01-20: 5 mg via RESPIRATORY_TRACT
  Filled 2016-01-20: qty 6

## 2016-01-20 MED ORDER — HYDROCODONE-ACETAMINOPHEN 5-325 MG PO TABS
2.0000 | ORAL_TABLET | Freq: Once | ORAL | Status: AC
  Administered 2016-01-20: 2 via ORAL
  Filled 2016-01-20: qty 2

## 2016-01-20 MED ORDER — MORPHINE SULFATE (PF) 4 MG/ML IV SOLN
4.0000 mg | Freq: Once | INTRAVENOUS | Status: AC
  Administered 2016-01-20: 4 mg via INTRAVENOUS
  Filled 2016-01-20: qty 1

## 2016-01-20 NOTE — ED Provider Notes (Addendum)
CSN: [id number]     Arrival date & time 01/20/16  1858 History   First MD Initiated Contact with Patient 01/20/16 1859     Chief Complaint  Patient presents with  . Shortness of Breath     (Consider location/radiation/quality/duration/timing/severity/associated sxs/prior Treatment) HPI Complains of right-sided chest pain onset approximately a week ago becoming worse at 4 PM today. Pain is worse with deep inspiration and cough. He reports been coughing approximately every 30 minutes. Other associated symptoms include shortness of breath, and chills no known fever. Treated with Roxicodone prior to coming here. Brought by EMS. Past Medical History  Diagnosis Date  . Depression   . Anxiety   . Hypertension   . GERD (gastroesophageal reflux disease)   . Seizures [facility])    Past Surgical History  Procedure Laterality Date  . Finger surgery Left     5th digit  . Video bronchoscopy N/A 01/02/2016    Procedure: VIDEO BRONCHOSCOPY;  Surgeon: Loreli Slot, MD;  Location: [facility] OR;  Service: Thoracic;  Laterality: N/A;  . Video assisted thoracoscopy (vats)/decortication Right 01/02/2016    Procedure: VIDEO ASSISTED THORACOSCOPY (VATS)/DECORTICATION;  Surgeon: Loreli Slot, MD;  Location: [facility] OR;  Service: Thoracic;  Laterality: Right;  . Pleural effusion drainage Right 01/02/2016    Procedure: DRAINAGE OF PLEURAL EFFUSION;  Surgeon: Loreli Slot, MD;  Location: [facility] OR;  Service: Thoracic;  Laterality: Right;   Family History  Problem Relation Age of Onset  . CAD Mother   . Diabetes Mellitus II Father    Social History  Substance Use Topics  . Smoking status: Current Every Day Smoker -- 0.50 packs/day    Types: Cigarettes  . Smokeless tobacco: None  . Alcohol Use: No     Comment: pt states he no longer drinks    Review of Systems  Constitutional: Positive for chills.  HENT: Negative.   Respiratory: Positive for cough and shortness of breath.   Cardiovascular: Positive  for chest pain.  Gastrointestinal: Negative.   Musculoskeletal: Negative.   Skin: Negative.   Neurological: Negative.   Psychiatric/Behavioral: Negative.   All other systems reviewed and are negative.     Allergies  Review of patient's allergies indicates no known allergies.  Home Medications   Prior to Admission medications   Medication Sig Start Date End Date Taking? Authorizing Provider  amoxicillin-clavulanate (AUGMENTIN) 875-125 MG tablet Take 1 tablet by mouth 2 (two) times daily. 01/11/16   Belkys A Regalado, MD  buPROPion (WELLBUTRIN XL) 300 MG 24 hr tablet Take 300 mg by mouth daily.    Historical Provider, MD  clonazePAM (KLONOPIN) 1 MG tablet Take 1 tablet (1 mg total) by mouth 2 (two) times daily. 01/11/16   Belkys A Regalado, MD  divalproex (DEPAKOTE ER) 500 MG 24 hr tablet Take 4 tablets (2,000 mg total) by mouth at bedtime. 11/13/15   Jimmy Footman, MD  escitalopram (LEXAPRO) 20 MG tablet Take 20 mg by mouth every morning.    Historical Provider, MD  esomeprazole (NEXIUM) 40 MG capsule Take 40 mg by mouth daily.     Historical Provider, MD  ferrous sulfate 325 (65 FE) MG tablet Take 1 tablet (325 mg total) by mouth 2 (two) times daily with a meal. 01/11/16   Belkys A Regalado, MD  Fluticasone-Salmeterol (ADVAIR) 250-50 MCG/DOSE AEPB Inhale 1 puff into the lungs 2 (two) times daily.    Historical Provider, MD  hydrOXYzine (ATARAX/VISTARIL) 50 MG tablet Take 50 mg by mouth 3 (three)  times daily as needed for anxiety.    Historical Provider, MD  Melatonin 5 MG TABS Take 10 mg by mouth at bedtime.    Historical Provider, MD  Multiple Vitamins-Minerals (THEREMS-M) TABS Take 1 tablet by mouth daily.    Historical Provider, MD  oxyCODONE (OXY IR/ROXICODONE) 5 MG immediate release tablet Take 1 tablet (5 mg total) by mouth every 4 (four) hours as needed for severe pain. 01/11/16   Belkys A Regalado, MD  traMADol (ULTRAM) 50 MG tablet Take 1 tablet (50 mg total) by mouth  every 8 (eight) hours as needed for moderate pain. 01/11/16   Belkys A Regalado, MD  traZODone (DESYREL) 150 MG tablet Take 1 tablet (150 mg total) by mouth at bedtime. 01/11/16   Belkys A Regalado, MD   BP 136/80 mmHg  Pulse 70  Temp(Src) 98.2 F (36.8 C) (Oral)  Resp 12  Ht 5\' 11"  (1.803 m)  Wt 179 lb (81.194 kg)  BMI 24.98 kg/m2  SpO2 93% Physical Exam  Constitutional: He appears well-developed and well-nourished. No distress.  HENT:  Head: Normocephalic and atraumatic.  Eyes: Conjunctivae are normal. Pupils are equal, round, and reactive to light.  Neck: Neck supple. No tracheal deviation present. No thyromegaly present.  Cardiovascular: Normal rate and regular rhythm.   No murmur heard. Pulmonary/Chest: Effort normal and breath sounds normal.  Sutured surgical wound at right lateral chest  Abdominal: Soft. Bowel sounds are normal. He exhibits no distension. There is no tenderness.  Musculoskeletal: Normal range of motion. He exhibits no edema or tenderness.  Neurological: He is alert. Coordination normal.  Skin: Skin is warm and dry. No rash noted.  Psychiatric: He has a normal mood and affect.  Nursing note and vitals reviewed.   ED Course  Procedures (including critical care time) Labs Review Labs Reviewed  BASIC METABOLIC PANEL  CBC WITH DIFFERENTIAL/PLATELET    Imaging Review No results found. I have personally reviewed and evaluated these images and lab results as part of my medical decision-making.   EKG Interpretation None     Chest x-ray viewed by me  1040 PM breathing much improved after treatment with albuterol nebulizer. Still complains of pain after treatment with intravenous morphine. Norco ordered. Results for orders placed or performed during the hospital encounter of 01/20/16  Basic metabolic panel  Result Value Ref Range   Sodium 138 135 - 145 mmol/L   Potassium 4.0 3.5 - 5.1 mmol/L   Chloride 101 101 - 111 mmol/L   CO2 26 22 - 32 mmol/L    Glucose, Bld 81 65 - 99 mg/dL   BUN 5 (L) 6 - 20 mg/dL   Creatinine, Ser 1.61 0.61 - 1.24 mg/dL   Calcium 8.8 (L) 8.9 - 10.3 mg/dL   GFR calc non Af Amer >60 >60 mL/min   GFR calc Af Amer >60 >60 mL/min   Anion gap 11 5 - 15  CBC with Differential/Platelet  Result Value Ref Range   WBC 6.2 4.0 - 10.5 K/uL   RBC 2.75 (L) 4.22 - 5.81 MIL/uL   Hemoglobin 8.0 (L) 13.0 - 17.0 g/dL   HCT 09.6 (L) 04.5 - 40.9 %   MCV 92.0 78.0 - 100.0 fL   MCH 29.1 26.0 - 34.0 pg   MCHC 31.6 30.0 - 36.0 g/dL   RDW 81.1 91.4 - 78.2 %   Platelets 310 150 - 400 K/uL   Neutrophils Relative % 55 %   Neutro Abs 3.5 1.7 - 7.7 K/uL  Lymphocytes Relative 31 %   Lymphs Abs 1.9 0.7 - 4.0 K/uL   Monocytes Relative 10 %   Monocytes Absolute 0.6 0.1 - 1.0 K/uL   Eosinophils Relative 3 %   Eosinophils Absolute 0.2 0.0 - 0.7 K/uL   Basophils Relative 1 %   Basophils Absolute 0.1 0.0 - 0.1 K/uL   Dg Chest 2 View  01/20/2016  CLINICAL DATA:  Cough and SOB for past few days. Recent admission to hospital and treated for right empyema 01/06/2016. EXAM: CHEST  2 VIEW COMPARISON:  01/13/2016 FINDINGS: Loculated right posterior empyema appears slightly smaller than on the prior study. Opacity in the adjacent right lower lobe and right middle lobe appears improved on the lateral view. There are no new areas of lung opacity. Left lung is essentially clear. No left pleural effusion. Cardiac silhouette is normal in size. No mediastinal or hilar masses. IMPRESSION: 1. No acute findings. 2. Right posterior empyema appears slightly smaller than on the prior study. There is less opacity in the right lower lobe and right middle lobe. No new abnormalities. Electronically Signed   By: Amie Portland M.D.   On: 01/20/2016 19:50   Dg Chest 2 View  01/13/2016  CLINICAL DATA:  Right chest pain. The patient was discharged from the hospital 01/11/2016 after admission and treatment for right empyema. Initial encounter. EXAM: CHEST  2 VIEW COMPARISON:   CT chest 12/31/2015. Single view of the chest 01/09/2016. FINDINGS: There is a moderate to moderately large right pleural effusion which includes a loculated component on the lateral view of the posterior chest. There is airspace disease on the right. Aeration in the right chest appears improved since the most recent exam. The left lung is clear. No pneumothorax is identified. Heart size is normal. IMPRESSION: Aeration in the right chest appears improved since the most recent comparison study but there is a persistent loculated appearing right pleural fluid collection in the posterior right chest. Negative for pneumothorax. Electronically Signed   By: Drusilla Kanner M.D.   On: 01/13/2016 19:53   Dg Chest 2 View  12/31/2015  CLINICAL DATA:  Cough for 3 weeks, worsening shortness of breath. EXAM: CHEST  2 VIEW COMPARISON:  None. FINDINGS: There is complete opacity of the right hemi thorax. There is suggestion of air-fluid level/ cavity in the right lung base. The left lung is clear. The evaluation of the mediastinum and heart size are limited due to with loss of mediastinal and cardiac border. The visualized skeletal structures are unremarkable. IMPRESSION: Complete opacity of the right hemi thorax with suggestion of air-fluid level/ cavity in the right lung base, further evaluation with chest CT is recommended. Electronically Signed   By: Sherian Rein M.D.   On: 12/31/2015 20:21   Ct Chest W Contrast  12/31/2015  CLINICAL DATA:  Acute onset of cough and shortness of breath. Right-sided pleural effusion. Initial encounter. EXAM: CT CHEST WITH CONTRAST TECHNIQUE: Multidetector CT imaging of the chest was performed during intravenous contrast administration. CONTRAST:  OMNIPAQUE IOHEXOL 300 MG/ML  SOLN COMPARISON:  Chest radiograph performed earlier today at 7:55 p.m. FINDINGS: A large loculated right-sided pleural effusion is noted, with surrounding atelectasis. An empyema cannot be excluded. There is a  large 6.9 x 6.7 x 4.4 cm complex collection of fluid and air at the right middle lobe, with a prominent surrounding rind. This most likely reflects a lung abscess, though infection of an underlying necrotic mass cannot be excluded. The left lung is relatively  clear.  No pneumothorax is seen. A subcarinal node measures up to 1.4 cm in short axis. No additional mediastinal lymphadenopathy is seen. No pericardial effusion is identified. The great vessels are grossly unremarkable in appearance. The visualized portions of thyroid gland are unremarkable. No axillary lymphadenopathy is seen. The visualized portions of the liver are grossly unremarkable. The spleen appears mildly enlarged. The visualized portions of the pancreas, adrenal glands, kidneys and gallbladder are grossly unremarkable. An 8 mm lymph node is noted adjacent to the distal esophagus. No acute osseous abnormalities are seen. There appears to be a chronic incompletely healed fracture at the upper body of the sternum, with associated sclerosis and mild underlying periosteal change. Underlying infection cannot be excluded. IMPRESSION: 1. Large 6.9 x 6.7 x 4.4 cm complex collection of fluid and air at the right middle lobe, with prominent surrounding rind. This most likely reflects a lung abscess, though infection of an underlying necrotic mass cannot be excluded. Further evaluation is recommended as deemed clinically appropriate. 2. Large loculated right-sided pleural effusion, occupying much of the right lung. Cannot exclude empyema. Surrounding atelectasis noted. 3. Enlarged subcarinal node, measuring 1.4 cm in short axis. Unusual 8 mm node noted adjacent to the distal esophagus. This may simply reflect infection, though malignancy cannot be entirely excluded. 4. Mild splenomegaly. 5. Apparent chronic incompletely healed fracture at the upper body of the sternum, with associated sclerosis and mild underlying periosteal change. Underlying infection cannot  be excluded. Electronically Signed   By: Roanna Raider M.D.   On: 12/31/2015 23:44   Ct Angio Chest Pe W/cm &/or Wo Cm  01/20/2016  CLINICAL DATA:  40 year old male with right-sided chest pain. Recent VATS procedure. EXAM: CT ANGIOGRAPHY CHEST WITH CONTRAST TECHNIQUE: Multidetector CT imaging of the chest was performed using the standard protocol during bolus administration of intravenous contrast. Multiplanar CT image reconstructions and MIPs were obtained to evaluate the vascular anatomy. CONTRAST:  OMNIPAQUE IOHEXOL 350 MG/ML SOLN COMPARISON:  Chest radiograph dated 01/20/2016 and CT dated 12/31/2015 FINDINGS: There is a 3.2 x 3.8 cm area of cavitation in the right middle lobe as seen on the prior study. There is consolidative changes of the adjacent lung with air bronchograms. There is a moderate size right pleural effusion/empyema containing multiple pockets of air. Small amount of loculated appearing air noted in the right apical pleural space, likely postprocedural. The left lung is clear. The central airways are patent. The thoracic aorta appears unremarkable. No CT evidence of pulmonary embolism. Top-normal cardiac size. No pericardial effusion. There is coronary vascular calcification. Right hilar adenopathy as well as subcarinal adenopathy noted. The esophagus is grossly unremarkable. There is no axillary adenopathy. The chest wall soft tissues appear unremarkable. The osseous structures are intact. The visualized abdomen appears unremarkable. Review of the MIP images confirms the above findings. IMPRESSION: No CT evidence of pulmonary embolism. Slight interval decrease in the size of the right middle lobe cavitary lesion. Moderate size right pleural effusion/empyema with multiple pockets of air. Small loculated appearing right apical pneumothorax. These results were called by telephone at the time of interpretation on 01/20/2016 at 10:22 pm to Dr. Doug Sou , who verbally acknowledged these  results. Electronically Signed   By: Elgie Collard M.D.   On: 01/20/2016 22:24   Dg Chest Port 1 View  01/09/2016  CLINICAL DATA:  40 year old male with a history of right-sided empyema EXAM: PORTABLE CHEST 1 VIEW COMPARISON:  Prior chest x-ray 01/07/2006 FINDINGS: Interval removal of right IJ  central venous catheter and right-sided chest tube. No evidence of pneumothorax. Stable cardiac and mediastinal contours. Persistent right cyst mid and lower lung patchy airspace opacities and right inferior pleural thickening. The left lung remains well aerated. No acute osseous abnormality. IMPRESSION: 1. Interval removal of central venous catheter and right-sided chest tube. 2. No evidence of pneumothorax. 3. Persistent right lower lung pleural thickening and associated right mid and lower lung patchy airspace opacities which may reflect atelectasis or residual infiltrates. Electronically Signed   By: Malachy Moan M.D.   On: 01/09/2016 07:48   Dg Chest Port 1 View  01/08/2016  CLINICAL DATA:  Chest tube removal EXAM: PORTABLE CHEST 1 VIEW COMPARISON:  01/07/2016 FINDINGS: Cardiomediastinal silhouette is stable. There is right chest tube stable in position. The second more medial right chest tube has been removed. There is no pneumothorax. Persistent small right pleural effusion with right basilar atelectasis or infiltrate. Stable right IJ central line position. IMPRESSION: Stable right chest tube position. The second right chest tube has been removed. No pneumothorax. Right IJ central line is stable in position. Again noted small pleural effusion with right basilar atelectasis or infiltrate. Electronically Signed   By: Natasha Mead M.D.   On: 01/08/2016 08:25   Dg Chest Port 1 View  01/07/2016  CLINICAL DATA:  Empyema EXAM: PORTABLE CHEST 1 VIEW COMPARISON:  Yesterday FINDINGS: Right jugular venous catheter stable. Right chest tubes stable. Airspace disease in the right mid and lower lung zone is stable.  Minimal atelectasis at the left base is stable. Normal heart size. No pneumothorax. Right pleural effusions stable. IMPRESSION: Stable. Right pleural and parenchymal disease with chest tubes in place and no pneumothorax. Electronically Signed   By: Jolaine Click M.D.   On: 01/07/2016 08:12   Dg Chest Port 1 View  01/06/2016  CLINICAL DATA:  Follow-up empyema with chest tube drainage EXAM: PORTABLE CHEST 1 VIEW COMPARISON:  Portable chest x-ray of January 05, 2016. FINDINGS: The left lung is adequately inflated and clear. On the right there is persistent increased density in the mid and lower lung. The 2 chest tubes are present and stable. Pleural thickening or pleural fluid extends over the lateral convexity of the aerated right lung. The heart is normal in size. The pulmonary vascularity is not engorged. The right internal jugular venous catheter tip projects over the midportion of the SVC. IMPRESSION: Persistent airspace opacities on the right consistent with edema or pneumonia. A moderate-size residual pleural effusion on the right is suspected though this may be loculated. There is no pneumothorax or pneumomediastinum. The chest tubes are in stable position. On the left no significant pulmonary parenchymal abnormality is observed. Electronically Signed   By: David  [place] M.D.   On: 01/06/2016 08:01   Dg Chest Port 1 View  01/05/2016  CLINICAL DATA:  Follow-up pneumothorax. EXAM: PORTABLE CHEST 1 VIEW COMPARISON:  Portable chest x-ray of January 04, 2016. FINDINGS: On the right there is persistent increased density along the periphery of the long and at the right lung base consistent with pleural fluid or pleural thickening. No pneumothorax or pneumomediastinum is evident. There is persistent increased interstitial density in the right lung base which is less conspicuous today. The right-sided chest tubes are in stable position. There is no mediastinal shift. The left lung is clear. The heart and  pulmonary vascularity are normal. The right internal jugular venous catheter tip projects over the midportion of the SVC. IMPRESSION: 1. Improving appearance of the interstitium at  the right lung base consistent with resolving atelectasis or infiltrate. There is stable pleural thickening or pleural fluid surrounding the convexity of the right lung with a small amount of the right lung base. 2. The low left lung and the heart and pulmonary vascularity are normal. 3. Stable appearance of the 2 chest tubes and the right internal jugular venous catheter. Electronically Signed   By: David  [place] M.D.   On: 01/05/2016 08:11   Dg Chest Port 1 View  01/04/2016  CLINICAL DATA:  Empyema, history hypertension EXAM: PORTABLE CHEST 1 VIEW COMPARISON:  Portable exam 0629 hours compared 01/03/2016 FINDINGS: Tip of RIGHT jugular line projects over cavoatrial junction. Pair of RIGHT thoracostomy tubes again identified. Stable heart size and mediastinal contours. Persistent pleural fluid versus thickening RIGHT hemi thorax with atelectasis of mid to lower RIGHT lung and questionable RIGHT lower lobe infiltrate. LEFT lung clear. No pneumothorax. IMPRESSION: Persistent RIGHT pleural effusion versus thickening. Persistent atelectasis versus consolidation of mid to lower RIGHT lung. No significant interval change. Electronically Signed   By: Ulyses Southward M.D.   On: 01/04/2016 08:00   Dg Chest Port 1 View  01/03/2016  CLINICAL DATA:  Cough for 2 weeks.  Recent pneumothorax EXAM: PORTABLE CHEST 1 VIEW COMPARISON:  January 02, 2016 chest radiograph and chest CT December 31, 2015 FINDINGS: Chest tubes remain on the right. Small right apical pneumothorax is stable. No tension component. Central catheter tip is at the cavoatrial junction. The apparent abscess in the right middle lobe is again noted with air collection surrounded by fluid, unchanged. There apparent layering effusion on the right. No new opacity is identified on the right  compared to 1 day prior. Left lung is clear. Heart size is within normal limits. The pulmonary vascularity appears unremarkable. No adenopathy is demonstrable by radiography. IMPRESSION: Small pneumothorax on the right is stable. No tension component. No change in tube and catheter positions. Pleural effusion on the right is felt to be stable as is apparent right middle lobe abscess. Left lung clear. No change in cardiac silhouette. Electronically Signed   By: Bretta Bang III M.D.   On: 01/03/2016 08:46   Dg Chest Port 1 View  01/02/2016  CLINICAL DATA:  Right pneumothorax EXAM: PORTABLE CHEST 1 VIEW COMPARISON:  Two days ago FINDINGS: Small right apical pneumothorax, 10% or less. Right chest drains in place. Opacity at the right base from pleural thickening and/or residual fluid. Right middle lobe abscess as seen on chest CT 12/31/2015. Right IJ central line, tip at the lower SVC level. Clear left lung. IMPRESSION: 1. Small right pneumothorax, less than 10%. 2. Right middle lobe abscess and treated empyema. Electronically Signed   By: Marnee Spring M.D.   On: 01/02/2016 13:43    MDM   Pt is anemic,. Hgb unchanged from 11 days ago.Discussed with Dr. Cornelius Moras. No further intervention needed tonight plan prescription Norco, albuterol HFA with spacer to go to use 2 puffs every 4 hours when necessary shortness of breath. He has an incentive spirometer which he is instructed to use every 15 minutes while awake. Keep scheduled appointment with thoracic surgery office 01/26/2016  Final diagnoses:  None   Dx#1 post operative pain #2 occult right pneumopthorax #3anemia     Doug Sou, MD 01/20/16 2318  Addendum Roxicodone is listed on patient's medications. He states that Norco treat his pain better. Roxicodone will be discontinued  Doug Sou, MD 01/20/16 2337

## 2016-01-20 NOTE — ED Notes (Signed)
Pt arrives from [facility] c/o shortness of breath with difficulty taking deep breaths. Pt states that he believes that it was a chest tube that the had last admission and is c/o of pain at insertion site and right abd tenderness.

## 2016-01-20 NOTE — Discharge Instructions (Signed)
Discontinue Roxicodone. Take the hydrocodone -apap (Norco) prescribed instead for bad pain . Use your albuterol inhaler 2 puffs every 4 hours as needed for cough or shortness of breath. Use your incentive spirometer every 15 minutes while awake. Keep your scheduled appointment at the thoracic surgery office on 01/26/2016. Return if you feel worse for any reason or call thoracic surgery office during office hours.

## 2016-01-20 NOTE — ED Notes (Signed)
[facility] contacted to transport patient to [facility]

## 2016-01-25 ENCOUNTER — Other Ambulatory Visit: Payer: Self-pay | Admitting: Thoracic Surgery (Cardiothoracic Vascular Surgery)

## 2016-01-25 DIAGNOSIS — J940 Chylous effusion: Secondary | ICD-10-CM

## 2016-01-26 ENCOUNTER — Encounter: Payer: Self-pay | Admitting: Thoracic Surgery (Cardiothoracic Vascular Surgery)

## 2016-01-26 ENCOUNTER — Ambulatory Visit
Admission: RE | Admit: 2016-01-26 | Discharge: 2016-01-26 | Disposition: A | Discharge status: Home / Self Care | Payer: Medicare Other | Source: Ambulatory Visit | Attending: Thoracic Surgery (Cardiothoracic Vascular Surgery) | Admitting: Thoracic Surgery (Cardiothoracic Vascular Surgery)

## 2016-01-26 ENCOUNTER — Ambulatory Visit (INDEPENDENT_AMBULATORY_CARE_PROVIDER_SITE_OTHER): Payer: Self-pay | Admitting: Thoracic Surgery (Cardiothoracic Vascular Surgery)

## 2016-01-26 VITALS — BP 128/74 | HR 71 | Resp 16 | Ht 71.0 in | Wt 179.0 lb

## 2016-01-26 DIAGNOSIS — J940 Chylous effusion: Secondary | ICD-10-CM

## 2016-01-26 DIAGNOSIS — J869 Pyothorax without fistula: Secondary | ICD-10-CM

## 2016-01-26 DIAGNOSIS — Z09 Encounter for follow-up examination after completed treatment for conditions other than malignant neoplasm: Secondary | ICD-10-CM

## 2016-01-26 NOTE — Progress Notes (Signed)
[address]       [place] [id number]             [phone]       HPI: [name] returns for a scheduled postoperative follow-up visit.  He is a 40 year old man who underwent a right VATS, drainage of empyema and decortication on 01/02/2016. Cultures grew microaerophilic Streptococcus. He was treated with intravenous antibiotics for a couple of weeks postop and then was discharged to [facility] assisted living facility on Augmentin.  He complains of incisional pain. He says that tramadol is not effective. He currently is receiving 50 mg by mouth every 8 hours. He previously had prescriptions for Vicodin and oxycodone but those were discontinued by the [facility] M.D. because the patient was leaving the facility and coming back in intoxicated. He denies leaving the facility or drinking alcohol.  Past Medical History  Diagnosis Date  . Depression   . Anxiety   . Hypertension   . GERD (gastroesophageal reflux disease)   . Seizures (HCC)       Current Outpatient Prescriptions  Medication Sig Dispense Refill  . amoxicillin-clavulanate (AUGMENTIN) 875-125 MG tablet Take 1 tablet by mouth 2 (two) times daily. 28 tablet 0  . buPROPion (WELLBUTRIN XL) 300 MG 24 hr tablet Take 300 mg by mouth daily.    . clonazePAM (KLONOPIN) 1 MG tablet Take 1 tablet (1 mg total) by mouth 2 (two) times daily. 30 tablet 0  . divalproex (DEPAKOTE ER) 500 MG 24 hr tablet Take 4 tablets (2,000 mg total) by mouth at bedtime. 120 tablet 0  . escitalopram (LEXAPRO) 20 MG tablet Take 20 mg by mouth every morning.    Marland Kitchen. esomeprazole (NEXIUM) 40 MG capsule Take 40 mg by mouth daily.     . ferrous sulfate 325 (65 FE) MG tablet Take 1 tablet (325 mg total) by mouth 2 (two) times daily with a meal. 30 tablet 3  . Fluticasone-Salmeterol (ADVAIR) 250-50 MCG/DOSE AEPB Inhale 1 puff into the lungs 2 (two) times daily.    . hydrOXYzine (ATARAX/VISTARIL) 50 MG tablet Take 50 mg by mouth 3 (three)  times daily as needed for anxiety.    . Melatonin 5 MG TABS Take 10 mg by mouth at bedtime.    . Multiple Vitamins-Minerals (THEREMS-M) TABS Take 1 tablet by mouth daily.    . traMADol (ULTRAM) 50 MG tablet Take 1 tablet (50 mg total) by mouth every 8 (eight) hours as needed for moderate pain. 30 tablet 0  . traZODone (DESYREL) 150 MG tablet Take 1 tablet (150 mg total) by mouth at bedtime. 30 tablet 0   No current facility-administered medications for this visit.    Physical Exam BP 128/74 mmHg  Pulse 71  Resp 16  Ht 5\' 11"  (1.803 m)  Wt 179 lb (81.194 kg)  BMI 24.98 kg/m2  SpO672 5297% 40 year old man in no acute distress Alert and oriented 3 with no focal neurologic deficit. Anxious and mildly agitated Lungs with diminished breath sounds posteriorly on right Incisions healing well  Diagnostic Tests: I personally reviewed the chest x-ray. It shows postoperative changes. There is some residual loculated effusion posteriorly.  Impression: 40 year old man who is now almost a month out from right VATS, drainage of empyema and decortication.  I am not particularly concerned about his chest x-ray appearance I do not see any indication for CT scanning at the present time. It is not unusual to have some residual fluid  after decortication. He has no fevers or chills to suggest an ongoing infectious process. Should he develop fevers or chills repeat CT scan would be appropriate.  He does need another chest x-ray in about a month to evaluate the progression of the postoperative changes.  His pain control issues are difficult. He is at a facility that is not locked down. He has not been compliant with the staff at that facility. He has been going off campus and apparently returning intoxicated. His Vicodin and oxycodone prescriptions have been discontinued by the physician at that facility. I will not give him an additional prescription for those medications at the present time. I also am  uncomfortable increasing his tramadol prescription in the setting of possible interaction with Lexapro.  Plan: Return in 4 weeks with repeat chest x-ray.  Loreli Slot, MD [facility] [phone]

## 2016-01-27 ENCOUNTER — Telehealth: Payer: Self-pay | Admitting: *Deleted

## 2016-01-27 NOTE — Telephone Encounter (Signed)
Pt left w/o checking out.  Dr. Dorris FetchHendrickson wanted to see him again in 4 weeks with CXR.  Appt was made and appt letter mailed.  Call made to [facility] regarding appt date/time.

## 2016-02-11 ENCOUNTER — Encounter (HOSPITAL_COMMUNITY): Payer: Self-pay

## 2016-02-11 ENCOUNTER — Emergency Department (HOSPITAL_COMMUNITY): Payer: Medicare Other

## 2016-02-11 ENCOUNTER — Emergency Department (HOSPITAL_COMMUNITY)
Admission: EM | Admit: 2016-02-11 | Discharge: 2016-02-12 | Disposition: A | Discharge status: Home / Self Care | Payer: Medicare Other | Attending: Emergency Medicine | Admitting: Emergency Medicine

## 2016-02-11 DIAGNOSIS — F17210 Nicotine dependence, cigarettes, uncomplicated: Secondary | ICD-10-CM | POA: Diagnosis not present

## 2016-02-11 DIAGNOSIS — R197 Diarrhea, unspecified: Secondary | ICD-10-CM

## 2016-02-11 DIAGNOSIS — Z792 Long term (current) use of antibiotics: Secondary | ICD-10-CM | POA: Insufficient documentation

## 2016-02-11 DIAGNOSIS — K219 Gastro-esophageal reflux disease without esophagitis: Secondary | ICD-10-CM | POA: Insufficient documentation

## 2016-02-11 DIAGNOSIS — I10 Essential (primary) hypertension: Secondary | ICD-10-CM | POA: Insufficient documentation

## 2016-02-11 DIAGNOSIS — F419 Anxiety disorder, unspecified: Secondary | ICD-10-CM | POA: Insufficient documentation

## 2016-02-11 DIAGNOSIS — Z7951 Long term (current) use of inhaled steroids: Secondary | ICD-10-CM | POA: Insufficient documentation

## 2016-02-11 DIAGNOSIS — G8918 Other acute postprocedural pain: Secondary | ICD-10-CM | POA: Diagnosis not present

## 2016-02-11 DIAGNOSIS — R0602 Shortness of breath: Secondary | ICD-10-CM | POA: Insufficient documentation

## 2016-02-11 DIAGNOSIS — F329 Major depressive disorder, single episode, unspecified: Secondary | ICD-10-CM | POA: Diagnosis not present

## 2016-02-11 DIAGNOSIS — Z79899 Other long term (current) drug therapy: Secondary | ICD-10-CM | POA: Insufficient documentation

## 2016-02-11 LAB — URINALYSIS, ROUTINE W REFLEX MICROSCOPIC
Bilirubin Urine: NEGATIVE
GLUCOSE, UA: NEGATIVE mg/dL
Hgb urine dipstick: NEGATIVE
Ketones, ur: NEGATIVE mg/dL
LEUKOCYTES UA: NEGATIVE
NITRITE: NEGATIVE
PH: 5.5 (ref 5.0–8.0)
Protein, ur: NEGATIVE mg/dL
SPECIFIC GRAVITY, URINE: 1.011 (ref 1.005–1.030)

## 2016-02-11 LAB — CBC
HEMATOCRIT: 42.6 % (ref 39.0–52.0)
HEMOGLOBIN: 14.2 g/dL (ref 13.0–17.0)
MCH: 30.6 pg (ref 26.0–34.0)
MCHC: 33.3 g/dL (ref 30.0–36.0)
MCV: 91.8 fL (ref 78.0–100.0)
Platelets: 242 10*3/uL (ref 150–400)
RBC: 4.64 MIL/uL (ref 4.22–5.81)
RDW: 13.5 % (ref 11.5–15.5)
WBC: 7 10*3/uL (ref 4.0–10.5)

## 2016-02-11 LAB — BRAIN NATRIURETIC PEPTIDE: B NATRIURETIC PEPTIDE 5: 30.9 pg/mL (ref 0.0–100.0)

## 2016-02-11 LAB — COMPREHENSIVE METABOLIC PANEL
ALT: 13 U/L — ABNORMAL LOW (ref 17–63)
ANION GAP: 9 (ref 5–15)
AST: 16 U/L (ref 15–41)
Albumin: 4.1 g/dL (ref 3.5–5.0)
Alkaline Phosphatase: 70 U/L (ref 38–126)
BUN: 11 mg/dL (ref 6–20)
CHLORIDE: 102 mmol/L (ref 101–111)
CO2: 25 mmol/L (ref 22–32)
Calcium: 9.1 mg/dL (ref 8.9–10.3)
Creatinine, Ser: 1.24 mg/dL (ref 0.61–1.24)
GFR calc Af Amer: >60 mL/min (ref >60)
Glucose, Bld: 83 mg/dL (ref 65–99)
POTASSIUM: 4.4 mmol/L (ref 3.5–5.1)
Sodium: 136 mmol/L (ref 135–145)
TOTAL PROTEIN: 7.8 g/dL (ref 6.5–8.1)
Total Bilirubin: 0.5 mg/dL (ref 0.3–1.2)

## 2016-02-11 LAB — I-STAT TROPONIN, ED: Troponin i, poc: 0 ng/mL (ref 0.00–0.08)

## 2016-02-11 LAB — LIPASE, BLOOD: LIPASE: 25 U/L (ref 11–51)

## 2016-02-11 MED ORDER — HYDROCODONE-ACETAMINOPHEN 5-325 MG PO TABS
2.0000 | ORAL_TABLET | Freq: Once | ORAL | Status: AC
  Administered 2016-02-11: 2 via ORAL
  Filled 2016-02-11: qty 2

## 2016-02-11 MED ORDER — ALBUTEROL SULFATE HFA 108 (90 BASE) MCG/ACT IN AERS
2.0000 | INHALATION_SPRAY | RESPIRATORY_TRACT | Status: DC | PRN
  Administered 2016-02-12: 2 via RESPIRATORY_TRACT
  Filled 2016-02-11: qty 6.7

## 2016-02-11 MED ORDER — HYDROCODONE-ACETAMINOPHEN 5-325 MG PO TABS: 1.0000 | ORAL_TABLET | ORAL | Status: DC | PRN

## 2016-02-11 NOTE — ED Provider Notes (Signed)
CSN: [id number]     Arrival date & time 02/11/16  1721 History   First MD Initiated Contact with Patient 02/11/16 2010     Chief Complaint  Patient presents with  . Diarrhea     (Consider location/radiation/quality/duration/timing/severity/associated sxs/prior Treatment) Patient is a 40 y.o. male presenting with diarrhea. The history is provided by the patient and medical records.  Diarrhea   40 year old male with history of depression, anxiety, hypertension, GERD, seizure disorder, presenting to the ED for diarrhea. Patient is currently resident at [facility] care after recovering from thoracentesis and right chest tube placement last month.  He states over the past few days he has been having watery, green-colored diarrhea.  Has had some vomiting, very mild.  No melena, hematochezia, or hematemesis.  Denies known sick contacts but states it is possible in the assisted living facility.  Patient also reports some ctoninued pain in the right side of her chest.  He states he has had follow-up in the cardiothoracic clinic, scheduled for repeat chest CT in 2 months but otherwise cleared.  He states he still has some SOB with ambulation.  He is currently on advair, states it is does not help.  He was formerly on albuterol and states that seemed to help more.  He states he has been using his incentive spirometer as directed.  VSS.  Past Medical History  Diagnosis Date  . Depression   . Anxiety   . Hypertension   . GERD (gastroesophageal reflux disease)   . Seizures [facility])    Past Surgical History  Procedure Laterality Date  . Finger surgery Left     5th digit  . Video bronchoscopy N/A 01/02/2016    Procedure: VIDEO BRONCHOSCOPY;  Surgeon: Loreli Slot, MD;  Location: [facility] OR;  Service: Thoracic;  Laterality: N/A;  . Video assisted thoracoscopy (vats)/decortication Right 01/02/2016    Procedure: VIDEO ASSISTED THORACOSCOPY (VATS)/DECORTICATION;  Surgeon: Loreli Slot, MD;  Location: [facility]  OR;  Service: Thoracic;  Laterality: Right;  . Pleural effusion drainage Right 01/02/2016    Procedure: DRAINAGE OF PLEURAL EFFUSION;  Surgeon: Loreli Slot, MD;  Location: [facility] OR;  Service: Thoracic;  Laterality: Right;   Family History  Problem Relation Age of Onset  . CAD Mother   . Diabetes Mellitus II Father    Social History  Substance Use Topics  . Smoking status: Current Every Day Smoker -- 0.50 packs/day    Types: Cigarettes  . Smokeless tobacco: None  . Alcohol Use: No     Comment: pt states he no longer drinks    Review of Systems  Respiratory: Positive for shortness of breath.   Cardiovascular: Positive for chest pain (right chest wall).  Gastrointestinal: Positive for diarrhea.  All other systems reviewed and are negative.     Allergies  Review of patient's allergies indicates no known allergies.  Home Medications   Prior to Admission medications   Medication Sig Start Date End Date Taking? Authorizing Provider  amoxicillin-clavulanate (AUGMENTIN) 875-125 MG tablet Take 1 tablet by mouth 2 (two) times daily. 01/11/16  Yes Belkys A Regalado, MD  buPROPion (WELLBUTRIN XL) 300 MG 24 hr tablet Take 300 mg by mouth daily.   Yes Historical Provider, MD  clonazePAM (KLONOPIN) 1 MG tablet Take 1 tablet (1 mg total) by mouth 2 (two) times daily. 01/11/16  Yes Belkys A Regalado, MD  divalproex (DEPAKOTE ER) 500 MG 24 hr tablet Take 4 tablets (2,000 mg total) by mouth at bedtime. 11/13/15  Yes Jimmy FootmanAndrea Hernandez-Gonzalez, MD  escitalopram (LEXAPRO) 20 MG tablet Take 20 mg by mouth every morning.   Yes Historical Provider, MD  esomeprazole (NEXIUM) 40 MG capsule Take 40 mg by mouth daily.    Yes Historical Provider, MD  ferrous sulfate 325 (65 FE) MG tablet Take 1 tablet (325 mg total) by mouth 2 (two) times daily with a meal. 01/11/16  Yes Belkys A Regalado, MD  Fluticasone-Salmeterol (ADVAIR) 250-50 MCG/DOSE AEPB Inhale 1 puff into the lungs 2 (two) times daily.   Yes  Historical Provider, MD  HYDROcodone-acetaminophen (NORCO/VICODIN) 5-325 MG tablet Take 1-2 tablets by mouth every 4 (four) hours as needed for moderate pain.   Yes Historical Provider, MD  hydrOXYzine (ATARAX/VISTARIL) 50 MG tablet Take 50 mg by mouth 3 (three) times daily as needed for anxiety.   Yes Historical Provider, MD  Melatonin 5 MG TABS Take 10 mg by mouth at bedtime.   Yes Historical Provider, MD  Multiple Vitamins-Minerals (THEREMS-M) TABS Take 1 tablet by mouth daily.   Yes Historical Provider, MD  traMADol (ULTRAM) 50 MG tablet Take 1 tablet (50 mg total) by mouth every 8 (eight) hours as needed for moderate pain. 01/11/16  Yes Belkys A Regalado, MD  traZODone (DESYREL) 150 MG tablet Take 1 tablet (150 mg total) by mouth at bedtime. 01/11/16  Yes Belkys A Regalado, MD   BP 114/102 mmHg  Pulse 63  Temp(Src) 98.1 F (36.7 C) (Oral)  Resp 20  SpO2 100%   Physical Exam  Constitutional: He is oriented to person, place, and time. He appears well-developed and well-nourished.  HENT:  Head: Normocephalic and atraumatic.  Mouth/Throat: Oropharynx is clear and moist.  Eyes: Conjunctivae and EOM are normal. Pupils are equal, round, and reactive to light.  Neck: Normal range of motion. Neck supple.  Cardiovascular: Normal rate, regular rhythm and normal heart sounds.   Pulmonary/Chest: Effort normal and breath sounds normal. No respiratory distress. He has no wheezes.  Abdominal: Soft. Bowel sounds are normal. There is no tenderness. There is no guarding and no CVA tenderness.  Well healed right lateral chest wall surgical incisions; no skin warmth or induration, no drainage  Musculoskeletal: Normal range of motion. He exhibits no edema.  Neurological: He is alert and oriented to person, place, and time.  Skin: Skin is warm and dry. He is not diaphoretic.  Psychiatric: He has a normal mood and affect.  Nursing note and vitals reviewed.   ED Course  Procedures (including critical  care time) Labs Review Labs Reviewed  COMPREHENSIVE METABOLIC PANEL - Abnormal; Notable for the following:    ALT 13 (*)    All other components within normal limits  URINALYSIS, ROUTINE W REFLEX MICROSCOPIC (NOT AT [facility]) - Abnormal; Notable for the following:    APPearance CLOUDY (*)    All other components within normal limits  LIPASE, BLOOD  CBC  BRAIN NATRIURETIC PEPTIDE  I-STAT TROPOININ, ED    Imaging Review Dg Chest 2 View  02/11/2016  CLINICAL DATA:  40 year old male with shortness of breath EXAM: CHEST  2 VIEW COMPARISON:  Radiograph dated 01/26/2016 and chest CT dated 01/20/2016 FINDINGS: Two views of the chest demonstrate a grossly stable appearing moderate-sized loculated pleural effusion on the right posteriorly. A small right pleural effusion is noted at the right lung base. There is diffuse increased density in the mid to lower lung field which is slightly improved compared to prior study and may be related to atelectatic changes or pneumonia. The  left lung is clear. There is no pleural effusion on the left. There is no pneumothorax. The cardiac silhouette is within normal limits. No acute osseous pathology identified. IMPRESSION: Grossly stable appearing loculated right pleural effusion as well as a small pleural effusion at the right lung base. Overall there is slight improvement of the airspace density in the mid to lower right lung field. Continued follow-up recommended. Electronically Signed   By: Elgie Collard M.D.   On: 02/11/2016 21:58   I have personally reviewed and evaluated these images and lab results as part of my medical decision-making.   EKG Interpretation None      MDM   Final diagnoses:  Diarrhea, unspecified type  Post-operative pain   40 year old male here with diarrhea and continued postoperative pain from thoracentesis and right chest tube placement last month.  Patient is afebrile, nontoxic. His right lateral chest wall wounds are well-healed  without any signs of infection. His lungs are overall clear, vital signs are stable on room air. Abdomen is soft, nontender. Labwork is grossly unremarkable.  Chest x-ray with continued improvement of small right pleural effusion. Suspect right chest pain is continued post-operative pain.  Do not suspect ACS, PE, dissection.  Lower suspicion for acute intraabdominal pathology, diarrhea may be from large amounts of antibiotics recently.  May use imodium if needed, however patient without any active diarrhea here in ED.  Patient has scheduled follow-up with cardiothoracic surgery in 2 months for follow-up chest CT. He reports albuterol helped better with his breathing, will give inhaler for home use. Continue incentive spirometry.  Rx vicodin for pain as he responded well to this in ED.  Discussed plan with patient, he/she acknowledged understanding and agreed with plan of care.  Return precautions given for new or worsening symptoms.  12:07 AM Patient waiting for PTAR, requested dose of his nighttime trazodone as he states he will not be able to get it at facility as it is past medication hours.  Feel reasonable to give nighttime dose here.  Garlon Hatchet, PA-C 02/11/16 2347  Garlon Hatchet, PA-C 02/12/16 0011  Melene Plan, DO 02/13/16 [phone]

## 2016-02-11 NOTE — Discharge Instructions (Signed)
Take the prescribed medication as directed.  Use inhaler as needed for shortness of breath.  Continue your other inhalers and using incentive spirometer.   Follow-up with the cardiothoracic clinic as scheduled for your follow-up scan. Return to the ED for new or worsening symptoms.

## 2016-02-11 NOTE — ED Notes (Signed)
Per EMS, pt from [facility].  Brought in by wheelchair.  Pt with emesis and diarrhea x 3 days.  Pt states he had pneumonia and had fluid drained from lungs.  Pain in rt ribcage where wounds are located.  No vomiting in route.  Pt is able to stand.  Pt has COPD.  Gets winded with ambulation.  Vitals:  134/82, hr 90, resp 16,

## 2016-02-12 DIAGNOSIS — R197 Diarrhea, unspecified: Secondary | ICD-10-CM | POA: Diagnosis not present

## 2016-02-12 MED ORDER — TRAZODONE HCL 50 MG PO TABS
150.0000 mg | ORAL_TABLET | Freq: Once | ORAL | Status: AC
  Administered 2016-02-12: 150 mg via ORAL
  Filled 2016-02-12: qty 1

## 2016-02-12 MED ORDER — LOPERAMIDE HCL 2 MG PO CAPS
2.0000 mg | ORAL_CAPSULE | Freq: Once | ORAL | Status: AC
  Administered 2016-02-12: 2 mg via ORAL
  Filled 2016-02-12: qty 1

## 2016-02-22 ENCOUNTER — Other Ambulatory Visit: Payer: Self-pay | Admitting: Thoracic Surgery (Cardiothoracic Vascular Surgery)

## 2016-02-22 DIAGNOSIS — J869 Pyothorax without fistula: Secondary | ICD-10-CM

## 2016-02-23 ENCOUNTER — Encounter: Payer: Self-pay | Admitting: Thoracic Surgery (Cardiothoracic Vascular Surgery)

## 2016-03-06 ENCOUNTER — Emergency Department (HOSPITAL_COMMUNITY)
Admission: EM | Admit: 2016-03-06 | Discharge: 2016-03-06 | Disposition: A | Discharge status: Home / Self Care | Payer: Medicare Other | Attending: Emergency Medicine | Admitting: Emergency Medicine

## 2016-03-06 ENCOUNTER — Encounter (HOSPITAL_COMMUNITY): Payer: Self-pay

## 2016-03-06 ENCOUNTER — Emergency Department (HOSPITAL_COMMUNITY): Payer: Medicare Other

## 2016-03-06 DIAGNOSIS — K219 Gastro-esophageal reflux disease without esophagitis: Secondary | ICD-10-CM | POA: Diagnosis not present

## 2016-03-06 DIAGNOSIS — R109 Unspecified abdominal pain: Secondary | ICD-10-CM | POA: Diagnosis present

## 2016-03-06 DIAGNOSIS — I10 Essential (primary) hypertension: Secondary | ICD-10-CM | POA: Insufficient documentation

## 2016-03-06 DIAGNOSIS — Z880 Allergy status to penicillin: Secondary | ICD-10-CM | POA: Insufficient documentation

## 2016-03-06 DIAGNOSIS — J45909 Unspecified asthma, uncomplicated: Secondary | ICD-10-CM | POA: Insufficient documentation

## 2016-03-06 DIAGNOSIS — K859 Acute pancreatitis without necrosis or infection, unspecified: Secondary | ICD-10-CM | POA: Diagnosis not present

## 2016-03-06 DIAGNOSIS — Z7951 Long term (current) use of inhaled steroids: Secondary | ICD-10-CM | POA: Insufficient documentation

## 2016-03-06 DIAGNOSIS — F17210 Nicotine dependence, cigarettes, uncomplicated: Secondary | ICD-10-CM | POA: Insufficient documentation

## 2016-03-06 DIAGNOSIS — Z79899 Other long term (current) drug therapy: Secondary | ICD-10-CM | POA: Insufficient documentation

## 2016-03-06 DIAGNOSIS — F329 Major depressive disorder, single episode, unspecified: Secondary | ICD-10-CM | POA: Diagnosis not present

## 2016-03-06 DIAGNOSIS — Z792 Long term (current) use of antibiotics: Secondary | ICD-10-CM | POA: Diagnosis not present

## 2016-03-06 DIAGNOSIS — F419 Anxiety disorder, unspecified: Secondary | ICD-10-CM | POA: Diagnosis not present

## 2016-03-06 HISTORY — DX: Unspecified asthma, uncomplicated: J45.909

## 2016-03-06 LAB — COMPREHENSIVE METABOLIC PANEL
ALT: 22 U/L (ref 17–63)
ANION GAP: 13 (ref 5–15)
AST: 31 U/L (ref 15–41)
Albumin: 4.5 g/dL (ref 3.5–5.0)
Alkaline Phosphatase: 65 U/L (ref 38–126)
BILIRUBIN TOTAL: 0.5 mg/dL (ref 0.3–1.2)
BUN: 10 mg/dL (ref 6–20)
CHLORIDE: 103 mmol/L (ref 101–111)
CO2: 23 mmol/L (ref 22–32)
Calcium: 9.8 mg/dL (ref 8.9–10.3)
Creatinine, Ser: 1.1 mg/dL (ref 0.61–1.24)
Glucose, Bld: 92 mg/dL (ref 65–99)
POTASSIUM: 5.2 mmol/L — AB (ref 3.5–5.1)
Sodium: 139 mmol/L (ref 135–145)
TOTAL PROTEIN: 8.4 g/dL — AB (ref 6.5–8.1)

## 2016-03-06 LAB — CBC
HEMATOCRIT: 48.2 % (ref 39.0–52.0)
HEMOGLOBIN: 16.1 g/dL (ref 13.0–17.0)
MCH: 29.9 pg (ref 26.0–34.0)
MCHC: 33.4 g/dL (ref 30.0–36.0)
MCV: 89.4 fL (ref 78.0–100.0)
Platelets: 310 10*3/uL (ref 150–400)
RBC: 5.39 MIL/uL (ref 4.22–5.81)
RDW: 13 % (ref 11.5–15.5)
WBC: 10.4 10*3/uL (ref 4.0–10.5)

## 2016-03-06 LAB — URINALYSIS, ROUTINE W REFLEX MICROSCOPIC
Bilirubin Urine: NEGATIVE
Glucose, UA: NEGATIVE mg/dL
Hgb urine dipstick: NEGATIVE
KETONES UR: 15 mg/dL — AB
LEUKOCYTES UA: NEGATIVE
NITRITE: NEGATIVE
PH: 6 (ref 5.0–8.0)
Protein, ur: NEGATIVE mg/dL
SPECIFIC GRAVITY, URINE: 1.019 (ref 1.005–1.030)

## 2016-03-06 LAB — LIPASE, BLOOD: LIPASE: 91 U/L — AB (ref 11–51)

## 2016-03-06 MED ORDER — HYDROCODONE-ACETAMINOPHEN 5-325 MG PO TABS
1.0000 | ORAL_TABLET | Freq: Once | ORAL | Status: AC
  Administered 2016-03-06: 1 via ORAL
  Filled 2016-03-06: qty 1

## 2016-03-06 MED ORDER — IOPAMIDOL (ISOVUE-300) INJECTION 61%
INTRAVENOUS | Status: AC
  Administered 2016-03-06: 100 mL
  Filled 2016-03-06: qty 100

## 2016-03-06 MED ORDER — SODIUM CHLORIDE 0.9 % IV SOLN
80.0000 mg | Freq: Once | INTRAVENOUS | Status: AC
  Administered 2016-03-06: 80 mg via INTRAVENOUS
  Filled 2016-03-06: qty 80

## 2016-03-06 MED ORDER — MORPHINE SULFATE (PF) 2 MG/ML IV SOLN
2.0000 mg | Freq: Once | INTRAVENOUS | Status: DC
  Filled 2016-03-06: qty 1

## 2016-03-06 MED ORDER — ONDANSETRON HCL 4 MG/2ML IJ SOLN
4.0000 mg | Freq: Once | INTRAMUSCULAR | Status: AC
  Administered 2016-03-06: 4 mg via INTRAVENOUS
  Filled 2016-03-06: qty 2

## 2016-03-06 MED ORDER — ONDANSETRON 8 MG PO TBDP: 8.0000 mg | ORAL_TABLET | Freq: Three times a day (TID) | ORAL | Status: DC | PRN

## 2016-03-06 MED ORDER — OXYCODONE-ACETAMINOPHEN 5-325 MG PO TABS: 1.0000 | ORAL_TABLET | ORAL | Status: DC | PRN

## 2016-03-06 NOTE — Discharge Instructions (Signed)

## 2016-03-06 NOTE — ED Provider Notes (Signed)
CSN: [id number]     Arrival date & time 03/06/16  1006 History   First MD Initiated Contact with Patient 03/06/16 1014     Chief Complaint  Patient presents with  . Abdominal Pain  . 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 He describes it as sharp pain in the epigastrium to right upper quadrant. He says it is like pain he had in the past due to alcohol use. He denies any history of GI bleeding. He states he has been nauseated and vomited today. He ate ribs last night. He states he took his medicines today but vomited that back up. He denies any fever, chills, diarrhea, urinary tract infection symptoms. He is not dyspneic. He denies having any cough, right-sided chest pain, or dyspnea. Past Medical History  Diagnosis Date  . Depression   . Anxiety   . Hypertension   . GERD (gastroesophageal reflux disease)   . Seizures [facility])    Past Surgical History  Procedure Laterality Date  . Finger surgery Left     5th digit  . Video bronchoscopy N/A 01/02/2016    Procedure: VIDEO BRONCHOSCOPY;  Surgeon: Loreli Slot, MD;  Location: [facility] OR;  Service: Thoracic;  Laterality: N/A;  . Video assisted thoracoscopy (vats)/decortication Right 01/02/2016    Procedure: VIDEO ASSISTED THORACOSCOPY (VATS)/DECORTICATION;  Surgeon: Loreli Slot, MD;  Location: [facility] OR;  Service: Thoracic;  Laterality: Right;  . Pleural effusion drainage Right 01/02/2016    Procedure: DRAINAGE OF PLEURAL EFFUSION;  Surgeon: Loreli Slot, MD;  Location: [facility] OR;  Service: Thoracic;  Laterality: Right;   Family History  Problem Relation Age of Onset  . CAD Mother   . Diabetes Mellitus II Father    Social History  Substance Use  Topics  . Smoking status: Current Every Day Smoker -- 0.50 packs/day    Types: Cigarettes  . Smokeless tobacco: None  . Alcohol Use: No     Comment: pt states he no longer drinks    Review of Systems  All other systems reviewed and are negative.     Allergies  Penicillins  Home Medications   Prior to Admission medications   Medication Sig Start Date End Date Taking? Authorizing Provider  amoxicillin-clavulanate (AUGMENTIN) 875-125 MG tablet Take 1 tablet by mouth 2 (two) times daily. 01/11/16   Belkys A Regalado, MD  buPROPion (WELLBUTRIN XL) 300 MG 24 hr tablet Take 300 mg by mouth daily.    Historical Provider, MD  clonazePAM (KLONOPIN) 1 MG tablet Take 1 tablet (1 mg total) by mouth 2 (two) times daily. 01/11/16   Belkys A Regalado, MD  divalproex (DEPAKOTE ER) 500 MG 24 hr tablet Take 4 tablets (2,000 mg total) by mouth at bedtime. 11/13/15   Jimmy Footman, MD  escitalopram (LEXAPRO) 20 MG tablet Take 20 mg by mouth every morning.    Historical Provider, MD  esomeprazole (NEXIUM) 40 MG capsule Take 40 mg by mouth daily.     Historical Provider, MD  ferrous sulfate 325 (65 FE) MG tablet Take 1 tablet (325 mg total) by mouth 2 (two) times daily with a meal. 01/11/16   Belkys A Regalado, MD  Fluticasone-Salmeterol (ADVAIR) 250-50 MCG/DOSE AEPB Inhale 1 puff into the lungs 2 (two) times daily.    Historical Provider, MD  HYDROcodone-acetaminophen (NORCO/VICODIN) 5-325 MG tablet Take 1 tablet by mouth every 4 (four) hours as needed. 02/11/16   Garlon HatchetLisa M Sanders, PA-C  hydrOXYzine (ATARAX/VISTARIL) 50 MG tablet Take 50 mg by mouth 3 (three) times daily as needed for anxiety.    Historical Provider, MD  Melatonin 5 MG TABS Take 10 mg by mouth at bedtime.    Historical Provider, MD  Multiple Vitamins-Minerals (THEREMS-M) TABS Take 1 tablet by mouth daily.    Historical Provider, MD  traMADol (ULTRAM) 50 MG tablet Take 1 tablet (50 mg total) by mouth every 8 (eight) hours as needed  for moderate pain. 01/11/16   Belkys A Regalado, MD  traZODone (DESYREL) 150 MG tablet Take 1 tablet (150 mg total) by mouth at bedtime. 01/11/16   Belkys A Regalado, MD   SpO2 98% Physical Exam  Constitutional: He is oriented to person, place, and time. He appears well-developed and well-nourished.  HENT:  Head: Normocephalic and atraumatic.  Eyes: Conjunctivae are normal. Pupils are equal, round, and reactive to light.  Neck: Normal range of motion. Neck supple.  Cardiovascular: Normal rate and regular rhythm.   Pulmonary/Chest: Effort normal and breath sounds normal.  Abdominal: Soft. Bowel sounds are normal. He exhibits no distension and no mass. There is tenderness. There is no rebound and no guarding.  Musculoskeletal: Normal range of motion.  Neurological: He is alert and oriented to person, place, and time.  Skin: Skin is warm and dry.  Psychiatric: He has a normal mood and affect. His behavior is normal. Judgment and thought content normal.  Nursing note and vitals reviewed.   ED Course  Procedures (including critical care time) Labs Review Labs Reviewed  LIPASE, BLOOD  COMPREHENSIVE METABOLIC PANEL  CBC  URINALYSIS, ROUTINE W REFLEX MICROSCOPIC (NOT AT [facility])    Imaging Review No results found. I have personally reviewed and evaluated these images and lab results as part of my medical decision-making.   EKG Interpretation None      MDM   Final diagnoses:  Acute pancreatitis, unspecified pancreatitis type    Patient with some ongoing nausea but states he is tolerating fluids. Plan to discharge to home. He understands he will need to return if he is unable to tolerate his medicines or fluids at home. We discussed return precautions and need for close follow-up and voices understanding.    Margarita Grizzleanielle Jack Bolio, MD 03/06/16 [phone]

## 2016-03-06 NOTE — ED Notes (Signed)
Pt transported to CT ?

## 2016-03-06 NOTE — ED Notes (Signed)
PT unable to provide UA at this time... 

## 2016-03-06 NOTE — ED Notes (Signed)
PTAR- pt coming from [facility] with c/o sharp abd pain. Hx of alcoholism, reports last drink 3 years ago. He also reports blurred vision that started around 0900. Pt is alert and oriented, no neuro deficits. Reports blurred vision started with nausea when he stood to go to nurses station. No distress noted.

## 2016-03-12 ENCOUNTER — Emergency Department (HOSPITAL_COMMUNITY): Payer: Medicare Other

## 2016-03-12 ENCOUNTER — Encounter (HOSPITAL_COMMUNITY): Payer: Self-pay

## 2016-03-12 ENCOUNTER — Emergency Department (HOSPITAL_COMMUNITY)
Admission: EM | Admit: 2016-03-12 | Discharge: 2016-03-13 | Disposition: A | Discharge status: Home / Self Care | Payer: Medicare Other | Attending: Emergency Medicine | Admitting: Emergency Medicine

## 2016-03-12 DIAGNOSIS — J90 Pleural effusion, not elsewhere classified: Secondary | ICD-10-CM | POA: Insufficient documentation

## 2016-03-12 DIAGNOSIS — F17210 Nicotine dependence, cigarettes, uncomplicated: Secondary | ICD-10-CM | POA: Insufficient documentation

## 2016-03-12 DIAGNOSIS — J45909 Unspecified asthma, uncomplicated: Secondary | ICD-10-CM | POA: Diagnosis not present

## 2016-03-12 DIAGNOSIS — F419 Anxiety disorder, unspecified: Secondary | ICD-10-CM | POA: Diagnosis not present

## 2016-03-12 DIAGNOSIS — Z880 Allergy status to penicillin: Secondary | ICD-10-CM | POA: Diagnosis not present

## 2016-03-12 DIAGNOSIS — I10 Essential (primary) hypertension: Secondary | ICD-10-CM | POA: Insufficient documentation

## 2016-03-12 DIAGNOSIS — Z7951 Long term (current) use of inhaled steroids: Secondary | ICD-10-CM | POA: Diagnosis not present

## 2016-03-12 DIAGNOSIS — R1011 Right upper quadrant pain: Secondary | ICD-10-CM | POA: Diagnosis not present

## 2016-03-12 DIAGNOSIS — R1013 Epigastric pain: Secondary | ICD-10-CM | POA: Insufficient documentation

## 2016-03-12 DIAGNOSIS — R112 Nausea with vomiting, unspecified: Secondary | ICD-10-CM | POA: Insufficient documentation

## 2016-03-12 DIAGNOSIS — Z79899 Other long term (current) drug therapy: Secondary | ICD-10-CM | POA: Insufficient documentation

## 2016-03-12 DIAGNOSIS — R1010 Upper abdominal pain, unspecified: Secondary | ICD-10-CM | POA: Diagnosis present

## 2016-03-12 DIAGNOSIS — F329 Major depressive disorder, single episode, unspecified: Secondary | ICD-10-CM | POA: Insufficient documentation

## 2016-03-12 LAB — URINALYSIS, ROUTINE W REFLEX MICROSCOPIC
BILIRUBIN URINE: NEGATIVE
Glucose, UA: NEGATIVE mg/dL
Hgb urine dipstick: NEGATIVE
Ketones, ur: NEGATIVE mg/dL
LEUKOCYTES UA: NEGATIVE
NITRITE: NEGATIVE
PH: 6.5 (ref 5.0–8.0)
Protein, ur: NEGATIVE mg/dL
SPECIFIC GRAVITY, URINE: 1.003 — AB (ref 1.005–1.030)

## 2016-03-12 LAB — COMPREHENSIVE METABOLIC PANEL
ALT: 19 U/L (ref 17–63)
ANION GAP: 6 (ref 5–15)
AST: 22 U/L (ref 15–41)
Albumin: 4.3 g/dL (ref 3.5–5.0)
Alkaline Phosphatase: 52 U/L (ref 38–126)
BUN: 6 mg/dL (ref 6–20)
CO2: 25 mmol/L (ref 22–32)
Calcium: 9 mg/dL (ref 8.9–10.3)
Chloride: 104 mmol/L (ref 101–111)
Creatinine, Ser: 0.92 mg/dL (ref 0.61–1.24)
GLUCOSE: 96 mg/dL (ref 65–99)
POTASSIUM: 4.1 mmol/L (ref 3.5–5.1)
Sodium: 135 mmol/L (ref 135–145)
TOTAL PROTEIN: 7.1 g/dL (ref 6.5–8.1)

## 2016-03-12 LAB — CBC
HEMATOCRIT: 38.5 % — AB (ref 39.0–52.0)
Hemoglobin: 13.3 g/dL (ref 13.0–17.0)
MCH: 30 pg (ref 26.0–34.0)
MCHC: 34.5 g/dL (ref 30.0–36.0)
MCV: 86.9 fL (ref 78.0–100.0)
Platelets: 214 10*3/uL (ref 150–400)
RBC: 4.43 MIL/uL (ref 4.22–5.81)
RDW: 12.9 % (ref 11.5–15.5)
WBC: 6.4 10*3/uL (ref 4.0–10.5)

## 2016-03-12 LAB — LIPASE, BLOOD: LIPASE: 24 U/L (ref 11–51)

## 2016-03-12 MED ORDER — HYDROCODONE-ACETAMINOPHEN 5-325 MG PO TABS
2.0000 | ORAL_TABLET | Freq: Once | ORAL | Status: AC
  Administered 2016-03-12: 2 via ORAL
  Filled 2016-03-12: qty 2

## 2016-03-12 MED ORDER — MORPHINE SULFATE (PF) 4 MG/ML IV SOLN: 4.0000 mg | Freq: Once | INTRAVENOUS | Status: DC

## 2016-03-12 MED ORDER — SODIUM CHLORIDE 0.9 % IV BOLUS (SEPSIS)
1000.0000 mL | Freq: Once | INTRAVENOUS | Status: AC
  Administered 2016-03-12: 1000 mL via INTRAVENOUS

## 2016-03-12 MED ORDER — ONDANSETRON 4 MG PO TBDP
4.0000 mg | ORAL_TABLET | Freq: Once | ORAL | Status: AC | PRN
  Administered 2016-03-12: 4 mg via ORAL
  Filled 2016-03-12: qty 1

## 2016-03-12 NOTE — ED Notes (Signed)
Pt brought in from [facility] by EMS with complaints of RUQ pain. [facility]. Facility MD would like for the ED MD to call due to the pt seeking pain medication.

## 2016-03-12 NOTE — ED Provider Notes (Signed)
CSN: [id number]     Arrival date & time 03/12/16  2042 History   First MD Initiated Contact with Patient 03/12/16 2137     Chief Complaint  Patient presents with  . Abdominal Pain  . Emesis  . Nausea  . Constipation     (Consider location/radiation/quality/duration/timing/severity/associated sxs/prior Treatment) The history is provided by the patient and medical records. No language interpreter was used.     Carlie Corpus is a 40 y.o. 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 Patient denies bloody or bilious emesis. No known aggravating or alleviating factors.  Pt denies ever, chills, neck pain, diarrhea, weakness, dizziness, syncope, dysuria.  Patient states he was a previous alcoholic but has been clean for 4 years. He denies a history of GI bleed.  Review shows that patient was seen on 03/06/2016 for epigastric abdominal pain.  Labs at that time were reassuring with a slightly elevated lipase and he was diagnosed with acute pancreatitis. CT at that time showed resolving empyema but no other acute abnormalities.  Past Medical History  Diagnosis Date  . Depression   . Anxiety   . Hypertension   . GERD (gastroesophageal reflux disease)   . Seizures (HCC)   . Asthma    Past Surgical History  Procedure Laterality Date  . Finger surgery Left     5th digit  . Video bronchoscopy N/A 01/02/2016    Procedure: VIDEO BRONCHOSCOPY;  Surgeon: Loreli Slot, MD;  Location: [facility] OR;  Service: Thoracic;  Laterality: N/A;  . Video assisted thoracoscopy (vats)/decortication Right 01/02/2016    Procedure: VIDEO ASSISTED THORACOSCOPY (VATS)/DECORTICATION;  Surgeon: Loreli Slot, MD;  Location:  [facility] OR;  Service: Thoracic;  Laterality: Right;  . Pleural effusion drainage Right 01/02/2016    Procedure: DRAINAGE OF PLEURAL EFFUSION;  Surgeon: Loreli Slot, MD;  Location: [facility] OR;  Service: Thoracic;  Laterality: Right;   Family History  Problem Relation Age of Onset  . CAD Mother   . Diabetes Mellitus II Father    Social History  Substance Use Topics  . Smoking status: Current Every Day Smoker -- 0.50 packs/day    Types: Cigarettes  . Smokeless tobacco: None  . Alcohol Use: No     Comment: pt states he no longer drinks    Review of Systems  Constitutional: Negative for fever, diaphoresis, appetite change, fatigue and unexpected weight change.  HENT: Negative for mouth sores.   Eyes: Negative for visual disturbance.  Respiratory: Positive for cough. Negative for chest tightness, shortness of breath and wheezing.   Cardiovascular: Negative for chest pain.  Gastrointestinal: Positive for nausea, vomiting and abdominal pain. Negative for diarrhea and constipation.  Endocrine: Negative for polydipsia, polyphagia and polyuria.  Genitourinary: Negative for dysuria, urgency, frequency and hematuria.  Musculoskeletal: Negative for back pain and neck stiffness.  Skin: Negative for rash.  Allergic/Immunologic: Negative for immunocompromised state.  Neurological: Negative for syncope, light-headedness and headaches.  Hematological: Does not bruise/bleed easily.  Psychiatric/Behavioral: Negative for sleep disturbance. The patient is not nervous/anxious.       Allergies  Penicillins  Home Medications   Prior to Admission medications   Medication Sig Start Date End Date Taking? Authorizing Provider  buPROPion (WELLBUTRIN XL) 300 MG 24 hr tablet Take  300 mg by mouth daily.   Yes Historical Provider, MD  clonazePAM (KLONOPIN) 1 MG tablet Take 1 tablet (1 mg total) by mouth 2 (two) times daily. 01/11/16  Yes Belkys A Regalado, MD  divalproex (DEPAKOTE ER) 500 MG 24 hr tablet  Take 4 tablets (2,000 mg total) by mouth at bedtime. 11/13/15  Yes Jimmy FootmanAndrea Hernandez-Gonzalez, MD  escitalopram (LEXAPRO) 20 MG tablet Take 20 mg by mouth every morning.   Yes Historical Provider, MD  esomeprazole (NEXIUM) 40 MG capsule Take 40 mg by mouth daily.    Yes Historical Provider, MD  ferrous sulfate 325 (65 FE) MG tablet Take 1 tablet (325 mg total) by mouth 2 (two) times daily with a meal. 01/11/16  Yes Belkys A Regalado, MD  Fluticasone-Salmeterol (ADVAIR) 250-50 MCG/DOSE AEPB Inhale 1 puff into the lungs 2 (two) times daily.   Yes Historical Provider, MD  HYDROcodone-acetaminophen (NORCO/VICODIN) 5-325 MG tablet Take 1 tablet by mouth every 4 (four) hours as needed. Patient taking differently: Take 1 tablet by mouth every 4 (four) hours as needed (for pain).  02/11/16  Yes Garlon HatchetLisa M Sanders, PA-C  hydrOXYzine (ATARAX/VISTARIL) 50 MG tablet Take 50 mg by mouth 3 (three) times daily as needed for anxiety.   Yes Historical Provider, MD  loperamide (IMODIUM) 2 MG capsule Take 4 mg by mouth as needed for diarrhea or loose stools.   Yes Historical Provider, MD  Melatonin 5 MG TABS Take 10 mg by mouth at bedtime.   Yes Historical Provider, MD  Multiple Vitamins-Minerals (THEREMS-M) TABS Take 1 tablet by mouth daily.   Yes Historical Provider, MD  ondansetron (ZOFRAN ODT) 8 MG disintegrating tablet Take 1 tablet (8 mg total) by mouth every 8 (eight) hours as needed for nausea or vomiting. 03/06/16  Yes Margarita Grizzleanielle Ray, MD  oxyCODONE (OXY IR/ROXICODONE) 5 MG immediate release tablet Take 5 mg by mouth every 4 (four) hours as needed for severe pain (for severe pain).   Yes Historical Provider, MD  oxyCODONE-acetaminophen (PERCOCET/ROXICET) 5-325 MG tablet Take 1 tablet by mouth every 4 (four) hours as needed for severe pain. 03/06/16  Yes Margarita Grizzleanielle Ray, MD  QUEtiapine (SEROQUEL) 200 MG tablet Take 200 mg by mouth at bedtime. For mood/sleep.   Yes Historical Provider, MD  traMADol (ULTRAM) 50 MG tablet Take 1  tablet (50 mg total) by mouth every 8 (eight) hours as needed for moderate pain. 01/11/16  Yes Belkys A Regalado, MD  amoxicillin-clavulanate (AUGMENTIN) 875-125 MG tablet Take 1 tablet by mouth 2 (two) times daily. Patient not taking: Reported on 03/12/2016 01/11/16   Alba CoryBelkys A Regalado, MD  traZODone (DESYREL) 150 MG tablet Take 1 tablet (150 mg total) by mouth at bedtime. Patient not taking: Reported on 03/12/2016 01/11/16   Belkys A Regalado, MD   BP 138/91 mmHg  Pulse 60  Temp(Src) 98.5 F (36.9 C) (Oral)  Resp 16  Ht 5\' 11"  (1.803 m)  Wt 80.74 kg  BMI 24.84 kg/m2  SpO2 97% Physical Exam  Constitutional: He appears well-developed and well-nourished. No distress.  Awake, alert, nontoxic appearance  HENT:  Head: Normocephalic and atraumatic.  Mouth/Throat: Oropharynx is clear and moist. No oropharyngeal exudate.  Eyes: Conjunctivae are normal. No scleral icterus.  Neck: Normal range of motion. Neck supple.  Cardiovascular: Normal rate, regular rhythm and intact distal pulses.   Pulmonary/Chest: Effort normal and breath sounds normal. No respiratory distress. He has no decreased breath sounds. He has no wheezes.  Equal chest expansion Clear and equal breath  sounds Well-healed surgical incisions to the right chest  Abdominal: Soft. Bowel sounds are normal. He exhibits no mass. There is tenderness in the right upper quadrant and epigastric area. There is no rebound and no guarding.  Musculoskeletal: Normal range of motion. He exhibits no edema.  Neurological: He is alert.  Speech is clear and goal oriented Moves extremities without ataxia  Skin: Skin is warm and dry. He is not diaphoretic.  Psychiatric: He has a normal mood and affect.  Nursing note and vitals reviewed.   ED Course  Procedures (including critical care time) Labs Review Labs Reviewed  COMPREHENSIVE METABOLIC PANEL - Abnormal; Notable for the following:    Total Bilirubin <0.1 (*)    All other components within  normal limits  CBC - Abnormal; Notable for the following:    HCT 38.5 (*)    All other components within normal limits  URINALYSIS, ROUTINE W REFLEX MICROSCOPIC (NOT AT [facility]) - Abnormal; Notable for the following:    Specific Gravity, Urine 1.003 (*)    All other components within normal limits  LIPASE, BLOOD    Imaging Review Dg Chest 2 View  03/12/2016  CLINICAL DATA:  Right upper quadrant pain. Recently seen a cone on Sunday. EXAM: CHEST  2 VIEW COMPARISON:  03/06/2016 FINDINGS: There is a small right pleural effusion. There is fluid in the right fissures. There is extrapulmonary appearing masslike attenuation posteriorly couple corresponding to loculated collection in the posterior right pleural space on previous CT. Combination shadows from fluid in the fissures and the posterior pleural collection likely accounts for opacity in the right mid lung on the PA view. Appearances are similar to previous chest radiograph without significant progression. Left lung is clear. Normal heart size and pulmonary vascularity. Mediastinal contours are intact. IMPRESSION: Right pleural effusion with loculated fluid collection posteriorly and fluid in the fissures on the right. Changes are similar to previous study. Electronically Signed   By: Burman Nieves M.D.   On: 03/12/2016 22:48   I have personally reviewed and evaluated these images and lab results as part of my medical decision-making.    ED ECG REPORT   Date: 03/13/2016  Rate: 57  Rhythm: normal sinus rhythm  QRS Axis: normal  Intervals: normal  ST/T Wave abnormalities: nonspecific ST/T changes; wandering baseline  Conduction Disutrbances:none  Narrative Interpretation: computerized report of ST elevation appears to be wandering baseline  Old EKG Reviewed: changes noted  I have personally reviewed the EKG tracing and disagree with the computerized printout as noted.   MDM   Final diagnoses:  RUQ abdominal pain  Pleural effusion,  right   Rosemary Holms presents with RUQ abd and right lower chest pain.  Pt with TTP over the lower ribs.  No evidence of infection at the site of VATS.  Chest x-ray today is unchanged from chest x-ray on 03/06/2016. Images persistent pleural effusion but no worsening.  Patient without leukocytosis, fever, hypoxia or shortness of breath.  Vital signs are stable and patient is resting comfortably. Negative troponin.  [place] pending to assess for cholecystitis.    1:46 AM Care transferred to Elpidio Anis, PA-C who will follow imaging, re-evaluate and disposition.  [place] pending.      Dahlia Client Yelitza Reach, PA-C 03/13/16 [phone]

## 2016-03-12 NOTE — ED Notes (Signed)
Pt states he has not been taking his seizure meds (Dilantin) for the past month. Pt states last was 1 year ago.

## 2016-03-13 ENCOUNTER — Emergency Department (HOSPITAL_COMMUNITY): Payer: Medicare Other

## 2016-03-13 DIAGNOSIS — R1011 Right upper quadrant pain: Secondary | ICD-10-CM | POA: Diagnosis not present

## 2016-03-13 MED ORDER — HYDROCODONE-ACETAMINOPHEN 5-325 MG PO TABS
2.0000 | ORAL_TABLET | Freq: Once | ORAL | Status: AC
  Administered 2016-03-13: 2 via ORAL
  Filled 2016-03-13: qty 2

## 2016-03-13 NOTE — ED Provider Notes (Signed)
RUQ abdominal pain, no fever, with N, V. Recent empyema s/o VATs procedure - 03/01 - site looks unremarkable CXR unchanged from 4/16; CT abd 4/16 also negative, mildly elevated lipase 91 Normal lipase today  US pending now. If normal he can be discharged home. Patient lives in a rehab facility. Can be discharged home with pain mgmt to be provided by facility [facility]).  Re-evaluation: patient is comfortable appearing. Cart reviewed - lab and imaging evaluation does not support any acute process. VSS. Afebrile. [facility].   Elpidio AnisShari Muzammil Bruins, PA-C 03/13/16 0234  Rolland PorterMark James, MD 03/25/16 [phone]

## 2016-03-13 NOTE — ED Notes (Signed)
Pt. Requested [facility] for [facility].  PTAR called.

## 2016-03-13 NOTE — ED Notes (Signed)
[facility] Expected date:  Expected time:  Means of arrival:  Comments: Hold per charge nurse

## 2016-03-13 NOTE — Discharge Instructions (Signed)

## 2016-06-21 DIAGNOSIS — E871 Hypo-osmolality and hyponatremia: Secondary | ICD-10-CM

## 2016-06-21 HISTORY — DX: Hypo-osmolality and hyponatremia: E87.1

## 2016-06-27 ENCOUNTER — Encounter (HOSPITAL_COMMUNITY): Payer: Self-pay | Admitting: Emergency Medicine

## 2016-06-27 ENCOUNTER — Observation Stay (HOSPITAL_COMMUNITY): Payer: Medicare Other

## 2016-06-27 ENCOUNTER — Observation Stay (HOSPITAL_COMMUNITY)
Admission: EM | Admit: 2016-06-27 | Discharge: 2016-06-28 | Disposition: A | Discharge status: Home / Self Care | Payer: Medicare Other | Attending: Internal Medicine | Admitting: Internal Medicine

## 2016-06-27 ENCOUNTER — Emergency Department (HOSPITAL_COMMUNITY): Payer: Medicare Other

## 2016-06-27 DIAGNOSIS — J45909 Unspecified asthma, uncomplicated: Secondary | ICD-10-CM | POA: Insufficient documentation

## 2016-06-27 DIAGNOSIS — F603 Borderline personality disorder: Secondary | ICD-10-CM

## 2016-06-27 DIAGNOSIS — M7918 Myalgia, other site: Secondary | ICD-10-CM | POA: Diagnosis present

## 2016-06-27 DIAGNOSIS — F332 Major depressive disorder, recurrent severe without psychotic features: Secondary | ICD-10-CM | POA: Diagnosis present

## 2016-06-27 DIAGNOSIS — K219 Gastro-esophageal reflux disease without esophagitis: Secondary | ICD-10-CM | POA: Diagnosis present

## 2016-06-27 DIAGNOSIS — R1084 Generalized abdominal pain: Secondary | ICD-10-CM | POA: Diagnosis not present

## 2016-06-27 DIAGNOSIS — Z720 Tobacco use: Secondary | ICD-10-CM

## 2016-06-27 DIAGNOSIS — F1020 Alcohol dependence, uncomplicated: Secondary | ICD-10-CM | POA: Diagnosis present

## 2016-06-27 DIAGNOSIS — F17210 Nicotine dependence, cigarettes, uncomplicated: Secondary | ICD-10-CM | POA: Diagnosis not present

## 2016-06-27 DIAGNOSIS — I10 Essential (primary) hypertension: Secondary | ICD-10-CM | POA: Diagnosis not present

## 2016-06-27 DIAGNOSIS — R112 Nausea with vomiting, unspecified: Secondary | ICD-10-CM | POA: Diagnosis present

## 2016-06-27 DIAGNOSIS — R109 Unspecified abdominal pain: Secondary | ICD-10-CM

## 2016-06-27 DIAGNOSIS — E871 Hypo-osmolality and hyponatremia: Principal | ICD-10-CM | POA: Insufficient documentation

## 2016-06-27 DIAGNOSIS — G40909 Epilepsy, unspecified, not intractable, without status epilepticus: Secondary | ICD-10-CM

## 2016-06-27 HISTORY — DX: Hypo-osmolality and hyponatremia: E87.1

## 2016-06-27 LAB — CBC WITH DIFFERENTIAL/PLATELET
BASOS PCT: 1 %
Basophils Absolute: 0.1 10*3/uL (ref 0.0–0.1)
EOS ABS: 0.2 10*3/uL (ref 0.0–0.7)
Eosinophils Relative: 4 %
HCT: 36.1 % — ABNORMAL LOW (ref 39.0–52.0)
HEMOGLOBIN: 12.9 g/dL — AB (ref 13.0–17.0)
Lymphocytes Relative: 42 %
Lymphs Abs: 2 10*3/uL (ref 0.7–4.0)
MCH: 31.9 pg (ref 26.0–34.0)
MCHC: 35.7 g/dL (ref 30.0–36.0)
MCV: 89.4 fL (ref 78.0–100.0)
MONOS PCT: 12 %
Monocytes Absolute: 0.6 10*3/uL (ref 0.1–1.0)
NEUTROS PCT: 41 %
Neutro Abs: 2 10*3/uL (ref 1.7–7.7)
Platelets: 169 10*3/uL (ref 150–400)
RBC: 4.04 MIL/uL — ABNORMAL LOW (ref 4.22–5.81)
RDW: 12.8 % (ref 11.5–15.5)
WBC: 4.9 10*3/uL (ref 4.0–10.5)

## 2016-06-27 LAB — BASIC METABOLIC PANEL
Anion gap: 4 — ABNORMAL LOW (ref 5–15)
Anion gap: 9 (ref 5–15)
BUN: 5 mg/dL — ABNORMAL LOW (ref 6–20)
BUN: 5 mg/dL — ABNORMAL LOW (ref 6–20)
CALCIUM: 8.2 mg/dL — AB (ref 8.9–10.3)
CHLORIDE: 92 mmol/L — AB (ref 101–111)
CO2: 27 mmol/L (ref 22–32)
CO2: 25 mmol/L (ref 22–32)
CREATININE: 0.8 mg/dL (ref 0.61–1.24)
CREATININE: 0.71 mg/dL (ref 0.61–1.24)
Calcium: 8.6 mg/dL — ABNORMAL LOW (ref 8.9–10.3)
Chloride: 91 mmol/L — ABNORMAL LOW (ref 101–111)
GFR calc non Af Amer: >60 mL/min (ref >60)
GFR calc non Af Amer: >60 mL/min (ref >60)
Glucose, Bld: 106 mg/dL — ABNORMAL HIGH (ref 65–99)
Glucose, Bld: 93 mg/dL (ref 65–99)
POTASSIUM: 3.8 mmol/L (ref 3.5–5.1)
Potassium: 3.7 mmol/L (ref 3.5–5.1)
SODIUM: 122 mmol/L — AB (ref 135–145)
SODIUM: 126 mmol/L — AB (ref 135–145)

## 2016-06-27 LAB — I-STAT CHEM 8, ED
BUN: 4 mg/dL — AB (ref 6–20)
CALCIUM ION: 1.09 mmol/L — AB (ref 1.13–1.30)
CHLORIDE: 90 mmol/L — AB (ref 101–111)
Creatinine, Ser: 0.7 mg/dL (ref 0.61–1.24)
GLUCOSE: 99 mg/dL (ref 65–99)
HCT: 40 % (ref 39.0–52.0)
Hemoglobin: 13.6 g/dL (ref 13.0–17.0)
POTASSIUM: 3.9 mmol/L (ref 3.5–5.1)
SODIUM: 127 mmol/L — AB (ref 135–145)
TCO2: 25 mmol/L (ref 0–100)

## 2016-06-27 LAB — URINALYSIS, ROUTINE W REFLEX MICROSCOPIC
BILIRUBIN URINE: NEGATIVE
Glucose, UA: NEGATIVE mg/dL
Hgb urine dipstick: NEGATIVE
KETONES UR: NEGATIVE mg/dL
Leukocytes, UA: NEGATIVE
NITRITE: NEGATIVE
PH: 7 (ref 5.0–8.0)
PROTEIN: NEGATIVE mg/dL
Specific Gravity, Urine: 1.006 (ref 1.005–1.030)

## 2016-06-27 LAB — SODIUM, URINE, RANDOM: Sodium, Ur: 70 mmol/L

## 2016-06-27 LAB — OSMOLALITY, URINE: Osmolality, Ur: 247 mOsm/kg — ABNORMAL LOW (ref 300–900)

## 2016-06-27 LAB — OSMOLALITY: Osmolality: 262 mOsm/kg — ABNORMAL LOW (ref 275–295)

## 2016-06-27 LAB — MRSA PCR SCREENING: MRSA by PCR: NEGATIVE

## 2016-06-27 MED ORDER — FERROUS SULFATE 325 (65 FE) MG PO TABS
325.0000 mg | ORAL_TABLET | Freq: Two times a day (BID) | ORAL | Status: DC
  Administered 2016-06-27 – 2016-06-28 (×3): 325 mg via ORAL
  Filled 2016-06-27 (×3): qty 1

## 2016-06-27 MED ORDER — ESCITALOPRAM OXALATE 10 MG PO TABS
20.0000 mg | ORAL_TABLET | ORAL | Status: DC
  Administered 2016-06-27 – 2016-06-28 (×2): 20 mg via ORAL
  Filled 2016-06-27 (×2): qty 2

## 2016-06-27 MED ORDER — CARISOPRODOL 350 MG PO TABS
350.0000 mg | ORAL_TABLET | Freq: Three times a day (TID) | ORAL | Status: DC | PRN
  Administered 2016-06-27 – 2016-06-28 (×3): 350 mg via ORAL
  Filled 2016-06-27 (×3): qty 1

## 2016-06-27 MED ORDER — GABAPENTIN 300 MG PO CAPS
300.0000 mg | ORAL_CAPSULE | Freq: Three times a day (TID) | ORAL | Status: DC
  Filled 2016-06-27: qty 1

## 2016-06-27 MED ORDER — DIVALPROEX SODIUM ER 500 MG PO TB24
2000.0000 mg | ORAL_TABLET | Freq: Every day | ORAL | Status: DC
  Administered 2016-06-27: 2000 mg via ORAL
  Filled 2016-06-27: qty 4

## 2016-06-27 MED ORDER — SODIUM CHLORIDE 1 G PO TABS
2.0000 g | ORAL_TABLET | Freq: Two times a day (BID) | ORAL | Status: AC
Start: 2016-06-27 — End: 2016-06-28
  Administered 2016-06-27 – 2016-06-28 (×2): 2 g via ORAL
  Filled 2016-06-27 (×2): qty 2

## 2016-06-27 MED ORDER — IBUPROFEN 800 MG PO TABS
800.0000 mg | ORAL_TABLET | Freq: Three times a day (TID) | ORAL | Status: DC | PRN
  Administered 2016-06-27: 800 mg via ORAL
  Filled 2016-06-27 (×2): qty 1
  Filled 2016-06-27 (×2): qty 2

## 2016-06-27 MED ORDER — SODIUM CHLORIDE 0.9 % IV BOLUS (SEPSIS)
1000.0000 mL | INTRAVENOUS | Status: AC
  Administered 2016-06-27: 1000 mL via INTRAVENOUS

## 2016-06-27 MED ORDER — ACETAMINOPHEN 650 MG RE SUPP: 650.0000 mg | Freq: Four times a day (QID) | RECTAL | Status: DC | PRN

## 2016-06-27 MED ORDER — HYDROCODONE-ACETAMINOPHEN 5-325 MG PO TABS
2.0000 | ORAL_TABLET | Freq: Once | ORAL | Status: AC
  Administered 2016-06-27: 2 via ORAL
  Filled 2016-06-27: qty 2

## 2016-06-27 MED ORDER — FENTANYL CITRATE (PF) 100 MCG/2ML IJ SOLN
50.0000 ug | Freq: Once | INTRAMUSCULAR | Status: AC
  Administered 2016-06-27: 50 ug via INTRAVENOUS
  Filled 2016-06-27: qty 2

## 2016-06-27 MED ORDER — HYDROXYZINE HCL 25 MG PO TABS
50.0000 mg | ORAL_TABLET | Freq: Three times a day (TID) | ORAL | Status: DC | PRN
  Administered 2016-06-27 (×2): 50 mg via ORAL
  Filled 2016-06-27 (×2): qty 2

## 2016-06-27 MED ORDER — QUETIAPINE FUMARATE 200 MG PO TABS
200.0000 mg | ORAL_TABLET | Freq: Every day | ORAL | Status: DC
  Administered 2016-06-27: 200 mg via ORAL
  Filled 2016-06-27: qty 1
  Filled 2016-06-27: qty 4
  Filled 2016-06-27: qty 1

## 2016-06-27 MED ORDER — PANTOPRAZOLE SODIUM 40 MG PO TBEC
40.0000 mg | DELAYED_RELEASE_TABLET | Freq: Every day | ORAL | Status: DC
  Administered 2016-06-27: 40 mg via ORAL
  Filled 2016-06-27: qty 1

## 2016-06-27 MED ORDER — ONDANSETRON 4 MG PO TBDP
8.0000 mg | ORAL_TABLET | Freq: Once | ORAL | Status: AC
  Administered 2016-06-27: 8 mg via ORAL
  Filled 2016-06-27: qty 2

## 2016-06-27 MED ORDER — KETOROLAC TROMETHAMINE 30 MG/ML IJ SOLN
30.0000 mg | Freq: Four times a day (QID) | INTRAMUSCULAR | Status: DC | PRN
  Administered 2016-06-27: 30 mg via INTRAVENOUS
  Filled 2016-06-27: qty 1

## 2016-06-27 MED ORDER — ACETAMINOPHEN 325 MG PO TABS
650.0000 mg | ORAL_TABLET | Freq: Four times a day (QID) | ORAL | Status: DC | PRN
Start: 2016-06-27 — End: 2016-06-28

## 2016-06-27 MED ORDER — MOMETASONE FURO-FORMOTEROL FUM 200-5 MCG/ACT IN AERO
2.0000 | INHALATION_SPRAY | Freq: Two times a day (BID) | RESPIRATORY_TRACT | Status: DC
  Administered 2016-06-27: 2 via RESPIRATORY_TRACT
  Filled 2016-06-27: qty 8.8

## 2016-06-27 MED ORDER — ENOXAPARIN SODIUM 40 MG/0.4ML ~~LOC~~ SOLN
40.0000 mg | SUBCUTANEOUS | Status: DC
  Administered 2016-06-27 – 2016-06-28 (×2): 40 mg via SUBCUTANEOUS
  Filled 2016-06-27 (×2): qty 0.4

## 2016-06-27 NOTE — Clinical Social Work Note (Addendum)
CSW called legal guardian listed on facesheet. [name] reported she is no longer legal guardian and [name] [phone] is now legal guardian. [name] reported that patient has an ACT Team with [employer] [phone] and [facility]. CSW called and left a message for [employer] [name].  Shane Krysten Veronica, LCSW [phone]   11:37AM  CSW received a return call from [employer] [name]. She reported patient correct ACT team number and contact is Birdena JubileeCraig [name] [phone]. She reported patient has a past history of alcohol and pain medication abuse. She reported the plan is for the patient to return  [facility] once medically stable.   Valero EnergyShonny Nakira Litzau, LCSW [phone]

## 2016-06-27 NOTE — ED Notes (Addendum)
Pt provided with urinal for sample.  Pt requesting pain and nausea medication.  Pt sts "please tell the doctor the best thing that works for me is percocet".

## 2016-06-27 NOTE — Progress Notes (Signed)
[facility]  [facility] have examined the patient and reviewed the chart. He complains of ongoing left back pain after helping someone up a few days ago. He has had occasional dizzy spells but no syncope.    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 - will start fluid restriction- follow I and O carefully - will give 2 gm sodium cl tab today and tomorrow - reassess sodium and resume sodium tabs tomorrow if needed  Active Problems:   Seizure disorder (HCC) and pseudoseizures - cont home meds but hold Carbamazepine  Back pain - muscular- Toradol, Heating pad, muscle relaxant    Tobacco abuse - advised to quit    MDD (major depressive disorder), recurrent severe, without psychosis  - cont home meds for now    Borderline personality disorder    Alcohol use disorder, severe, in controlled environment In remission since he has  Been living in assisted living  Calvert CantorSaima Nathin Saran, MD

## 2016-06-27 NOTE — Progress Notes (Signed)
Patient stated muscle relaxer is not working.  MD notified.

## 2016-06-27 NOTE — ED Notes (Signed)
Pt returned from CT at this time.  Pt in NAD.  Will continue to closely monitor pt.

## 2016-06-27 NOTE — ED Provider Notes (Signed)
[facility]-EMERGENCY DEPT Provider Note   CSN: [id number] Arrival date & time: 06/27/16  0209  First Provider Contact:  First MD Initiated Contact with Patient 06/27/16 0256        History   Chief Complaint Chief Complaint  Patient presents with  . Back Pain    HPI [name] is a 40 y.o. male.  [name] is a 40 y.o. male  with a hx of depression, anxiety, seizures, asthma presents to the Emergency Department complaining of gradual, persistent, progressively worsening left flank pain onset 10 PM tonight. Associated symptoms include nausea and emesis 1.  She reports the emesis was nonbloody and nonbilious. He denies abdominal pain.  He reports associated painful urination. He reports he was given ibuprofen prior to arrival but this did not help. No aggravating or alleviating factors. He denies fever, chills, chest pain, shortness of breath, diarrhea, weakness and dizziness, syncope.   The history is provided by the patient and medical records. No language interpreter was used.    Past Medical History:  Diagnosis Date  . Anxiety   . Asthma   . Depression   . GERD (gastroesophageal reflux disease)   . Hypertension   . Seizures [facility])     Patient Active Problem List   Diagnosis Date Noted  . Hyponatremia 06/27/2016  . 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 Pleural effusion, right 12/31/2015  . GERD (gastroesophageal reflux disease) 11/11/2015  . Borderline personality disorder 11/11/2015  . Alcohol use disorder, severe, in controlled environment (HCC) 11/11/2015  . 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 Tobacco abuse 11/05/2013  . HTN (hypertension) 11/05/2013    Past Surgical History:  Procedure Laterality Date  . FINGER SURGERY Left    5th digit  . PLEURAL EFFUSION DRAINAGE Right 01/02/2016   Procedure: DRAINAGE OF PLEURAL EFFUSION;   Surgeon: Loreli Slot, MD;  Location: [facility] OR;  Service: Thoracic;  Laterality: Right;  Marland Kitchen VIDEO ASSISTED THORACOSCOPY (VATS)/DECORTICATION Right 01/02/2016   Procedure: VIDEO ASSISTED THORACOSCOPY (VATS)/DECORTICATION;  Surgeon: Loreli Slot, MD;  Location: [facility] OR;  Service: Thoracic;  Laterality: Right;  Marland Kitchen VIDEO BRONCHOSCOPY N/A 01/02/2016   Procedure: VIDEO BRONCHOSCOPY;  Surgeon: Loreli Slot, MD;  Location: [facility] OR;  Service: Thoracic;  Laterality: N/A;       Home Medications    Prior to Admission medications   Medication Sig Start Date End Date Taking? Authorizing Provider  albuterol (PROVENTIL HFA;VENTOLIN HFA) 108 (90 Base) MCG/ACT inhaler Inhale 2 puffs into the lungs every 4 (four) hours as needed for wheezing or shortness of breath.   Yes Historical Provider, MD  divalproex (DEPAKOTE ER) 500 MG 24 hr tablet Take 4 tablets (2,000 mg total) by mouth at bedtime. 11/13/15  Yes Jimmy Footman, MD  escitalopram (LEXAPRO) 20 MG tablet Take 20 mg by mouth every morning.   Yes Historical Provider, MD  esomeprazole (NEXIUM) 40 MG capsule Take 40 mg by mouth daily.    Yes Historical Provider, MD  ferrous sulfate 325 (65 FE) MG tablet Take 1 tablet (325 mg total) by mouth 2 (two) times daily with a meal. 01/11/16  Yes Belkys A Regalado, MD  Fluticasone-Salmeterol (ADVAIR) 250-50 MCG/DOSE AEPB Inhale 1 puff into the lungs 2 (two) times daily.   Yes Historical Provider, MD  gabapentin (NEURONTIN) 300 MG capsule Take 300 mg by mouth 3 (three) times daily.   Yes Historical  Provider, MD  hydrOXYzine (ATARAX/VISTARIL) 50 MG tablet Take 50 mg by mouth 3 (three) times daily as needed for anxiety.   Yes Historical Provider, MD  ibuprofen (ADVIL,MOTRIN) 800 MG tablet Take 800 mg by mouth 3 (three) times daily as needed for moderate pain.   Yes Historical Provider, MD  loperamide (IMODIUM) 2 MG capsule Take 4 mg by mouth daily as needed for diarrhea or loose stools.    Yes  Historical Provider, MD  Melatonin 5 MG TABS Take 10 mg by mouth at bedtime.   Yes Historical Provider, MD  Multiple Vitamins-Minerals (THEREMS-M) TABS Take 1 tablet by mouth daily.   Yes Historical Provider, MD  QUEtiapine (SEROQUEL) 200 MG tablet Take 200 mg by mouth at bedtime. For mood/sleep.   Yes Historical Provider, MD    Family History Family History  Problem Relation Age of Onset  . CAD Mother   . Diabetes Mellitus II Father   . Prostate cancer Paternal Grandfather     Social History Social History  Substance Use Topics  . Smoking status: Current Every Day Smoker    Packs/day: 0.50    Types: Cigarettes  . Smokeless tobacco: Never Used  . Alcohol use No     Comment: pt states he no longer drinks     Allergies   Penicillins   Review of Systems Review of Systems  Genitourinary: Positive for dysuria and flank pain.  Musculoskeletal: Positive for back pain.  All other systems reviewed and are negative.    Physical Exam Updated Vital Signs BP (!) 119/105   Pulse (!) 55   Temp 97.5 F (36.4 C) (Oral)   Resp 17   SpO2 99%   Physical Exam  Constitutional: He appears well-developed and well-nourished. No distress.  HENT:  Head: Normocephalic and atraumatic.  Mouth/Throat: Oropharynx is clear and moist. No oropharyngeal exudate.  Cardiovascular: Normal rate, regular rhythm, normal heart sounds and intact distal pulses.   Pulmonary/Chest: Effort normal and breath sounds normal. No respiratory distress. He has no wheezes.  Abdominal: Soft. Bowel sounds are normal. He exhibits no distension and no mass. There is generalized tenderness (Mild). There is CVA tenderness (Left). There is no rigidity, no rebound and no guarding.  Musculoskeletal: Normal range of motion. He exhibits no edema.  Neurological: He is alert.  Skin: Skin is warm and dry. No rash noted. He is not diaphoretic.  Psychiatric: He has a normal mood and affect.  Nursing note and vitals  reviewed.    ED Treatments / Results  Labs (all labs ordered are listed, but only abnormal results are displayed) Labs Reviewed  BASIC METABOLIC PANEL - Abnormal; Notable for the following:       Result Value   Sodium 122 (*)    Chloride 91 (*)    Glucose, Bld 106 (*)    BUN 5 (*)    Calcium 8.2 (*)    Anion gap 4 (*)    All other components within normal limits  CBC WITH DIFFERENTIAL/PLATELET - Abnormal; Notable for the following:    RBC 4.04 (*)    Hemoglobin 12.9 (*)    HCT 36.1 (*)    All other components within normal limits  I-STAT CHEM 8, ED - Abnormal; Notable for the following:    Sodium 127 (*)    Chloride 90 (*)    BUN 4 (*)    Calcium, Ion 1.09 (*)    All other components within normal limits  URINALYSIS, ROUTINE W REFLEX MICROSCOPIC (  NOT AT [facility])  BASIC METABOLIC PANEL    EKG  EKG Interpretation None       Radiology Ct Renal Stone Study  Result Date: 06/27/2016 CLINICAL DATA:  LEFT flank pain, burning with urination for 1 day. EXAM: CT ABDOMEN AND PELVIS WITHOUT CONTRAST TECHNIQUE: Multidetector CT imaging of the abdomen and pelvis was performed following the standard protocol without IV contrast. COMPARISON:  CT abdomen and pelvis March 07, 2015 FINDINGS: LUNG BASES: Stable small RIGHT pleural effusion/pleural thickening, status post RIGHT VATS. RIGHT middle lobe scarring. Minimal bronchial wall thickening cyst partially imaged. KIDNEYS/BLADDER: Kidneys are orthotopic, demonstrating normal size and morphology. No nephrolithiasis, hydronephrosis; limited assessment for renal masses on this nonenhanced examination. The unopacified ureters are normal in course and caliber. Urinary bladder is partially distended and unremarkable. SOLID ORGANS: The liver, spleen, gallbladder, pancreas and adrenal glands are unremarkable for this non-contrast examination. GASTROINTESTINAL TRACT: The stomach, small and large bowel are normal in course and caliber without inflammatory  changes, the sensitivity may be decreased by lack of enteric contrast. Mild amount of retained large bowel stool. Moderate sigmoid diverticulosis. Normal appendix. PERITONEUM/RETROPERITONEUM: Aortoiliac vessels are normal in course and caliber, trace calcific atherosclerosis. No lymphadenopathy by CT size criteria. Prostate size is normal. No intraperitoneal free fluid nor free air. SOFT TISSUES/ OSSEOUS STRUCTURES: Nonsuspicious. Mild bilateral hip osteoarthrosis. Severe L5-S1 disc height loss, vacuum disc and marginal spurring results in severe RIGHT L5-S1 neural foraminal narrowing. IMPRESSION: No urolithiasis, obstructive uropathy nor acute intra-abdominal/ pelvic process. Moderate sigmoid diverticulosis. Electronically Signed   By: Awilda Metro M.D.   On: 06/27/2016 04:19    Procedures Procedures (including critical care time)  Medications Ordered in ED Medications  ondansetron (ZOFRAN-ODT) disintegrating tablet 8 mg (8 mg Oral Given 06/27/16 0336)  HYDROcodone-acetaminophen (NORCO/VICODIN) 5-325 MG per tablet 2 tablet (2 tablets Oral Given 06/27/16 0336)  sodium chloride 0.9 % bolus 1,000 mL (0 mLs Intravenous Stopped 06/27/16 0610)  fentaNYL (SUBLIMAZE) injection 50 mcg (50 mcg Intravenous Given 06/27/16 0514)     Initial Impression / Assessment and Plan / ED Course  I have reviewed the triage vital signs and the nursing notes.  Pertinent labs & imaging results that were available during my care of the patient were reviewed by me and considered in my medical decision making (see chart for details).  Clinical Course  Value Comment By Time   Pt pain improved but not resolved Dierdre Forth, PA-C 08/07 0502  Leukocytes, UA: NEGATIVE No evidence of infection Dierdre Forth, PA-C 08/07 0502  Sodium: (!) 122 Admitting hyponatremia, hypochloremia, hypocalcemia and low anion gap. Suspicious for possible lab error will redraw. Dahlia Client Shontez Sermon, PA-C 08/07 0502  WBC: 4.9 No  leukocytosis. Mild anemia. Dahlia Client Leilanie Rauda, PA-C 08/07 0502  CT Renal Stone Study No evidence of ureteral stone. No other intra-abdominal pathology. Dierdre Forth, PA-C 08/07 0502   Discussed with Dr. Maryfrances Bunnell who will evaluate.   Dahlia Client Dearia Wilmouth, PA-C 08/07 0553    Pt with Flank pain. No evidence of nephrolithiasis. No other acute abnormality. Patient with significant hyponatremia. He is euvolemic. Likely secondary to his medications. Will need admission for correction.  Final Clinical Impressions(s) / ED Diagnoses   Final diagnoses:  Left flank pain  Hyponatremia    New Prescriptions New Prescriptions   No medications on file     Dierdre Forth, PA-C 06/27/16 0657    Layla Maw Ward, DO 06/27/16 0725

## 2016-06-27 NOTE — ED Notes (Signed)
Patient transported to CT at this time.  Pt in NAD at this time.

## 2016-06-27 NOTE — ED Triage Notes (Signed)
Per EMS report pt has had low back pain that started yesterday and has progressively gotten worse.  No recent falls.  Pain and burning upon urination.  Nausea, no vomiting.   142/78 60 14

## 2016-06-27 NOTE — Progress Notes (Signed)
Patient arrived on unit via stretcher from ED.  No family at bedside.  

## 2016-06-27 NOTE — Progress Notes (Signed)
Patient stated he does not take gabapentin anymore.  MD notified.

## 2016-06-27 NOTE — Progress Notes (Signed)
Reiterated fluid restriction with patient.  Discussed with patient how to keep up with fluid intake via note paper.  Patient stated he understood and will work to stay within fluid restrictions.

## 2016-06-27 NOTE — Care Management Obs Status (Signed)
MEDICARE OBSERVATION STATUS NOTIFICATION   Patient Details  Name: [name] MRN: [id number] Date of Birth: 09-09-1976   Medicare Observation Status Notification Given:  Yes  Info discussed with and faxed to pt legal guardian .Marland Kitchen.[name] [facility]. Ph [phone]  Voshon Petro, Annamarie MajorCheryl U, RN 06/27/2016, 3:44 PM

## 2016-06-27 NOTE — H&P (Signed)
History and Physical  Patient Name: [name]     ZOX:[id number]    DOB: [birth year]    DOA: 06/27/2016 PCP: Pcp Not In System   Patient coming from: ALF  Chief Complaint: Back pain  HPI: [name] is a 40 y.o. male with a past medical history significant for depression on Seroquel, SSRI, Depakote and new Tegretol, history of seizures, asthma, and anemia and and  who presents with back pain and found incidentally to have hyponatremia.  The patient was in his usual state of health until a few days ago he was helping lift a "heavier woman" who is also a resident at his ALF would fallen outside. The next day he developed some left-sided back pain, which has persisted since then. He came today to have this evaluated.  He has not had any recent weakness, confusion, tremor, seizures, loss of consciousness, changes to vision, sensory changes. He has had no recent poor PO intake, vomiting, diarrhea, or dehydration.  No recent abdominal swelling, no history of liver disease or CHF, no recent leg swelling or dyspnea on exertion or cough or orthopnea.  He did start a new psychiatric medication one month ago with his ACT team, carbamazepine.    ED course: -Afebrile, hemodynamically stable -Na 122, K 3.7, Cr 0.8 (baseline), WBC 4.9K, Hgb 12.9 -A UA and CT renal study were normal and the patient's back pain improved with fentanyl -He was given a small bolus of IV normal saline and TRH were asked to evaluate the patient for hyponatremia     ROS: Review of Systems  Constitutional: Negative for chills, diaphoresis, fever and malaise/fatigue.  Eyes: Negative for blurred vision.  Respiratory: Negative for cough, hemoptysis, shortness of breath and wheezing.   Cardiovascular: Negative.   Gastrointestinal: Negative.   Genitourinary: Negative.   Musculoskeletal: Positive for back pain. Negative for falls, joint pain, myalgias and neck pain.  Skin: Negative.   Neurological: Positive for  dizziness (Chronic). Negative for tingling, tremors, sensory change, speech change, focal weakness, seizures, loss of consciousness and weakness.  Endo/Heme/Allergies: Negative for polydipsia.  Psychiatric/Behavioral: Negative for depression, substance abuse and suicidal ideas.        Past Medical History:  Diagnosis Date  . Anxiety   . Asthma   . Depression   . GERD (gastroesophageal reflux disease)   . Hypertension   . Seizures (HCC)     Past Surgical History:  Procedure Laterality Date  . FINGER SURGERY Left    5th digit  . PLEURAL EFFUSION DRAINAGE Right 01/02/2016   Procedure: DRAINAGE OF PLEURAL EFFUSION;  Surgeon: Loreli Slot, MD;  Location: [facility] OR;  Service: Thoracic;  Laterality: Right;  Marland Kitchen VIDEO ASSISTED THORACOSCOPY (VATS)/DECORTICATION Right 01/02/2016   Procedure: VIDEO ASSISTED THORACOSCOPY (VATS)/DECORTICATION;  Surgeon: Loreli Slot, MD;  Location: [facility] OR;  Service: Thoracic;  Laterality: Right;  Marland Kitchen VIDEO BRONCHOSCOPY N/A 01/02/2016   Procedure: VIDEO BRONCHOSCOPY;  Surgeon: Loreli Slot, MD;  Location: [facility] OR;  Service: Thoracic;  Laterality: N/A;    Social History: Patient lives at an assisted living.  The patient walks unassisted.  He smokes.    Allergies  Allergen Reactions  . Penicillins Nausea And Vomiting    Has patient had a PCN reaction causing immediate rash, facial/tongue/throat swelling, SOB or lightheadedness with hypotension:No Has patient had a PCN reaction causing severe rash involving mucus membranes or skin necrosis:No Has patient had a PCN reaction that required hospitalization:No Has patient had a PCN reaction occurring  within the last 10 years:NO If all of the above answers are "NO", then may proceed with Cephalosporin use.      Family history: family history includes CAD in his mother; Diabetes Mellitus II in his father; Prostate cancer in his paternal grandfather.  Prior to Admission medications   Medication Sig  Start Date End Date Taking? Authorizing Provider  albuterol (PROVENTIL HFA;VENTOLIN HFA) 108 (90 Base) MCG/ACT inhaler Inhale 2 puffs into the lungs every 4 (four) hours as needed for wheezing or shortness of breath.   Yes Historical Provider, MD  carbamazepine (TEGRETOL) 200 MG tablet Take 200 mg by mouth 3 (three) times daily.   Yes Historical Provider, MD  divalproex (DEPAKOTE ER) 500 MG 24 hr tablet Take 4 tablets (2,000 mg total) by mouth at bedtime. 11/13/15  Yes Jimmy Footman, MD  escitalopram (LEXAPRO) 20 MG tablet Take 20 mg by mouth every morning.   Yes Historical Provider, MD  esomeprazole (NEXIUM) 40 MG capsule Take 40 mg by mouth daily.    Yes Historical Provider, MD  ferrous sulfate 325 (65 FE) MG tablet Take 1 tablet (325 mg total) by mouth 2 (two) times daily with a meal. 01/11/16  Yes Belkys A Regalado, MD  Fluticasone-Salmeterol (ADVAIR) 250-50 MCG/DOSE AEPB Inhale 1 puff into the lungs 2 (two) times daily.   Yes Historical Provider, MD  gabapentin (NEURONTIN) 300 MG capsule Take 300 mg by mouth 3 (three) times daily.   Yes Historical Provider, MD  hydrOXYzine (ATARAX/VISTARIL) 50 MG tablet Take 50 mg by mouth 3 (three) times daily as needed for anxiety.   Yes Historical Provider, MD  ibuprofen (ADVIL,MOTRIN) 800 MG tablet Take 800 mg by mouth 3 (three) times daily as needed for moderate pain.   Yes Historical Provider, MD  loperamide (IMODIUM) 2 MG capsule Take 4 mg by mouth daily as needed for diarrhea or loose stools.    Yes Historical Provider, MD  Melatonin 5 MG TABS Take 10 mg by mouth at bedtime.   Yes Historical Provider, MD  Multiple Vitamins-Minerals (THEREMS-M) TABS Take 1 tablet by mouth daily.   Yes Historical Provider, MD  QUEtiapine (SEROQUEL) 200 MG tablet Take 200 mg by mouth at bedtime. For mood/sleep.   Yes Historical Provider, MD       Physical Exam: BP 146/100   Pulse (!) 56   Temp 97.5 F (36.4 C) (Oral)   Resp 13   SpO2 99%  General  appearance: Well-developed, adult male, alert and in no acute distress.  Slightly tremulous.   Eyes: Anicteric, conjunctiva pink, lids and lashes normal.     ENT: No nasal deformity, discharge, or epistaxis.  OP moist without lesions.   Skin: Warm and dry.  No jaundice.  No suspicious rashes or lesions.  No stigmata of liver disease. Cardiac: RRR, nl S1-S2, no murmurs appreciated.  Capillary refill is brisk.  JVP normal.  No LE edema.  Radial and DP pulses 2+ and symmetric. Respiratory: Normal respiratory rate and rhythm.  CTAB without rales or wheezes. GI: Abdomen soft without rigidity.  No TTP. No ascites, distension, hepatosplenomegaly.   MSK: No deformities or effusions.  No clubbing/cyanosis. Neuro: Pupils are 3 mm and reactive to 2 mm.  Extraocular movements are intact, without nystagmus.  Cranial nerve 5 is within normal limits.  Cranial nerve 7 is symmetrical.  Cranial nerve 8 is within normal limits.  Cranial nerves 9 and 10 reveal equal palate elevation.  Cranial nerve 11 reveals sternocleidomastoid strong.  Cranial nerve  12 is midline.  Motor strength testing is 5/5 in the upper and lower extremities bilaterally with normal motor, tone and bulk. Deep tendon reflexes are normal bilaterally at knees.  Sensory examination is intact to light touchFinger-to-nose testing is symmetric.  No nystagmus. The patient is oriented to time, place and person.  Speech is fluent.  Naming is grossly intact.  Recall, recent and remote, as well as general fund of knowledge seem within normal limits.  Attention span and concentration are within normal limits. Psych: Affect normal.  Judgment and insight appear normal.       Labs on Admission:  I have personally reviewed following labs and imaging studies: CBC:  Recent Labs Lab 06/27/16 0244 06/27/16 0500  WBC 4.9  --   NEUTROABS 2.0  --   HGB 12.9* 13.6  HCT 36.1* 40.0  MCV 89.4  --   PLT 169  --    Basic Metabolic Panel:  Recent Labs Lab  06/27/16 0244 06/27/16 0500  NA 122* 127*  K 3.7 3.9  CL 91* 90*  CO2 27  --   GLUCOSE 106* 99  BUN 5* 4*  CREATININE 0.80 0.70  CALCIUM 8.2*  --    GFR: CrCl cannot be calculated (Unknown ideal weight.).    Radiological Exams on Admission: Personally reviewed: Ct Renal Stone Study  Result Date: 06/27/2016 CLINICAL DATA:  LEFT flank pain, burning with urination for 1 day. EXAM: CT ABDOMEN AND PELVIS WITHOUT CONTRAST TECHNIQUE: Multidetector CT imaging of the abdomen and pelvis was performed following the standard protocol without IV contrast. COMPARISON:  CT abdomen and pelvis March 07, 2015 FINDINGS: LUNG BASES: Stable small RIGHT pleural effusion/pleural thickening, status post RIGHT VATS. RIGHT middle lobe scarring. Minimal bronchial wall thickening cyst partially imaged. KIDNEYS/BLADDER: Kidneys are orthotopic, demonstrating normal size and morphology. No nephrolithiasis, hydronephrosis; limited assessment for renal masses on this nonenhanced examination. The unopacified ureters are normal in course and caliber. Urinary bladder is partially distended and unremarkable. SOLID ORGANS: The liver, spleen, gallbladder, pancreas and adrenal glands are unremarkable for this non-contrast examination. GASTROINTESTINAL TRACT: The stomach, small and large bowel are normal in course and caliber without inflammatory changes, the sensitivity may be decreased by lack of enteric contrast. Mild amount of retained large bowel stool. Moderate sigmoid diverticulosis. Normal appendix. PERITONEUM/RETROPERITONEUM: Aortoiliac vessels are normal in course and caliber, trace calcific atherosclerosis. No lymphadenopathy by CT size criteria. Prostate size is normal. No intraperitoneal free fluid nor free air. SOFT TISSUES/ OSSEOUS STRUCTURES: Nonsuspicious. Mild bilateral hip osteoarthrosis. Severe L5-S1 disc height loss, vacuum disc and marginal spurring results in severe RIGHT L5-S1 neural foraminal narrowing.  IMPRESSION: No urolithiasis, obstructive uropathy nor acute intra-abdominal/ pelvic process. Moderate sigmoid diverticulosis. Electronically Signed   By: Awilda Metro M.D.   On: 06/27/2016 04:19        Assessment/Plan 1. Hyponatremia:  Moderate hyponatremia, without symptoms.  He is really on quite a few medications that could cause or worsen hyponatremia, but the sequence suggests that carbamazepine is the culprit.  He appears euvolemic and has no symptoms of CHF or liver disease. -Stop carbamazepine -Check chest x-ray for mass -Check urine/serum osmolality -Check urine sodium -Repeat BMP tomorrow -Consult to Social Work to coordinate with patient's ACT team to replace Carbamazepine   2. Seizure disorder:  -Continue Depakote -Stop carbamazepine as above  3. Depression:  -Continue escitalopram and Seroquel and Depakote   4. Chronic pain:  -Continue gabapentin  -Continue ibuprofen PRN  5. Anemia:  -Continue iron  6. Asthma:  -Continue Advair as Dulera forumulary alternative  7. Other medications: -Continue hydroxyzine    DVT prophylaxis: Lovenox  Code Status: FULL  Family Communication: None present  Disposition Plan: Anticipate observation overnight and repeat BMP tomorrow.  Stop carbamazepine and check urine studies. Consults called: None Admission status: OBS, med surg     Medical decision making: Patient seen at 6:15 AM on 06/27/2016.  The patient was discussed with Dierdre ForthHannah Muthersbaugh, PA-C. What exists of the patient's chart was reviewed in depth.  Clinical condition: stable.        Alberteen SamChristopher P Carlo Lorson [facility] Pager [phone]

## 2016-06-28 DIAGNOSIS — F1020 Alcohol dependence, uncomplicated: Secondary | ICD-10-CM | POA: Diagnosis not present

## 2016-06-28 DIAGNOSIS — E871 Hypo-osmolality and hyponatremia: Secondary | ICD-10-CM | POA: Diagnosis not present

## 2016-06-28 DIAGNOSIS — K219 Gastro-esophageal reflux disease without esophagitis: Secondary | ICD-10-CM

## 2016-06-28 DIAGNOSIS — I10 Essential (primary) hypertension: Secondary | ICD-10-CM

## 2016-06-28 DIAGNOSIS — R109 Unspecified abdominal pain: Secondary | ICD-10-CM

## 2016-06-28 DIAGNOSIS — G40909 Epilepsy, unspecified, not intractable, without status epilepticus: Secondary | ICD-10-CM

## 2016-06-28 DIAGNOSIS — M7918 Myalgia, other site: Secondary | ICD-10-CM | POA: Diagnosis present

## 2016-06-28 DIAGNOSIS — M791 Myalgia: Secondary | ICD-10-CM

## 2016-06-28 DIAGNOSIS — F332 Major depressive disorder, recurrent severe without psychotic features: Secondary | ICD-10-CM

## 2016-06-28 LAB — BASIC METABOLIC PANEL
Anion gap: 9 (ref 5–15)
BUN: 6 mg/dL (ref 6–20)
CALCIUM: 9.4 mg/dL (ref 8.9–10.3)
CO2: 24 mmol/L (ref 22–32)
CREATININE: 0.73 mg/dL (ref 0.61–1.24)
Chloride: 102 mmol/L (ref 101–111)
Glucose, Bld: 113 mg/dL — ABNORMAL HIGH (ref 65–99)
Potassium: 4.4 mmol/L (ref 3.5–5.1)
SODIUM: 135 mmol/L (ref 135–145)

## 2016-06-28 MED ORDER — GUAIFENESIN ER 600 MG PO TB12: 1200.0000 mg | ORAL_TABLET | Freq: Two times a day (BID) | ORAL | 0 refills | Status: AC

## 2016-06-28 MED ORDER — CARISOPRODOL 350 MG PO TABS: 350.0000 mg | ORAL_TABLET | Freq: Three times a day (TID) | ORAL | 0 refills | Status: DC | PRN

## 2016-06-28 MED ORDER — SENNOSIDES-DOCUSATE SODIUM 8.6-50 MG PO TABS: 1.0000 | ORAL_TABLET | Freq: Every day | ORAL | Status: DC

## 2016-06-28 NOTE — Progress Notes (Signed)
Pt agreed to reschedule his 0500 BMP lab draw that he refused this am.  Lab in room now

## 2016-06-28 NOTE — Progress Notes (Signed)
Pt has agreed to have his blood draw done now. Pt refused it this am. Notified Phlebotomy and they are going to draw it/ Pt also would like to be discharged today.

## 2016-06-28 NOTE — Progress Notes (Signed)
[name] to be D/C'd Nursing Home per MD order.  Discussed prescriptions and follow up appointments with the patient. Prescriptions given to patient, medication list explained in detail. Pt verbalized understanding.    Medication List    STOP taking these medications   carbamazepine 200 MG tablet Commonly known as:  TEGRETOL     TAKE these medications   albuterol 108 (90 Base) MCG/ACT inhaler Commonly known as:  PROVENTIL HFA;VENTOLIN HFA Inhale 2 puffs into the lungs every 4 (four) hours as needed for wheezing or shortness of breath.   carisoprodol 350 MG tablet Commonly known as:  SOMA Take 1 tablet (350 mg total) by mouth 3 (three) times daily as needed (back pain/ spasms).   divalproex 500 MG 24 hr tablet Commonly known as:  DEPAKOTE ER Take 4 tablets (2,000 mg total) by mouth at bedtime.   escitalopram 20 MG tablet Commonly known as:  LEXAPRO Take 20 mg by mouth every morning.   esomeprazole 40 MG capsule Commonly known as:  NEXIUM Take 40 mg by mouth daily.   ferrous sulfate 325 (65 FE) MG tablet Take 1 tablet (325 mg total) by mouth 2 (two) times daily with a meal.   Fluticasone-Salmeterol 250-50 MCG/DOSE Aepb Commonly known as:  ADVAIR Inhale 1 puff into the lungs 2 (two) times daily.   gabapentin 300 MG capsule Commonly known as:  NEURONTIN Take 300 mg by mouth 3 (three) times daily.   guaiFENesin 600 MG 12 hr tablet Commonly known as:  MUCINEX Take 2 tablets (1,200 mg total) by mouth 2 (two) times daily.   hydrOXYzine 50 MG tablet Commonly known as:  ATARAX/VISTARIL Take 50 mg by mouth 3 (three) times daily as needed for anxiety.   ibuprofen 800 MG tablet Commonly known as:  ADVIL,MOTRIN Take 800 mg by mouth 3 (three) times daily as needed for moderate pain.   loperamide 2 MG capsule Commonly known as:  IMODIUM Take 4 mg by mouth daily as needed for diarrhea or loose stools.   Melatonin 5 MG Tabs Take 10 mg by mouth at bedtime.    QUEtiapine 200 MG tablet Commonly known as:  SEROQUEL Take 200 mg by mouth at bedtime. For mood/sleep.   THEREMS-M Tabs Take 1 tablet by mouth daily.       Vitals:   06/28/16 0404 06/28/16 0830  BP: 109/72 131/79  Pulse: (!) 55 (!) 56  Resp: 19 20  Temp: 98.2 F (36.8 C) 98.4 F (36.9 C)    Skin clean, dry and intact without evidence of skin break down, no evidence of skin tears noted. IV catheter discontinued intact. Site without signs and symptoms of complications. Dressing and pressure applied. Pt denies pain at this time. No complaints noted.  An After Visit Summary was printed and given to the patient. Patient escorted via WC, and D/C to [facility] via [employer]  Shelia Kingsberry BSN, RN [facility] Phone [phone]

## 2016-06-28 NOTE — Clinical Social Work Note (Signed)
[name] is medically stable for discharge today and will be returning to [facility]. Facility notified and discharge packet will accompany patient. CSW also contacted patient's legal guardian Bethanie Dickeriffany Dunn [phone]) regarding discharge. He will be transported back to facility by taxi.  Genelle BalVanessa Skilynn Durney, MSW, LCSW Licensed Clinical Social Worker Clinical Social Work Department [facility] [phone]

## 2016-06-28 NOTE — Clinical Social Work Note (Signed)
Clinical Social Work Assessment  Patient Details  Name: [name] Dale Lumley MRN: [id number] Date of Birth: 05-May-1976  Date of referral:  06/27/16               Reason for consult:  Facility Placement                Permission sought to share information with:  Facility Medical sales representativeContact Representative, Guardian Permission granted to share information::  Yes, Verbal Permission Granted  Name::     Bethanie Dickeriffany Dunn and French Anaracy  Agency::  [facility] - administrator French Anaracy  Relationship::  Bethanie Dickeriffany Dunn - legal guardian with [facility] [place] [facility]  Contact Information:  Bethanie Dickeriffany Dunn- [phone]- [phone] and French Anaracy with [facility] - [phone]  Housing/Transportation Living arrangements for the past 2 months:  Assisted Living Facility Source of Information:  Facility, Guardian, Patient Patient Interpreter Needed:  None Criminal Activity/Legal Involvement Pertinent to Current Situation/Hospitalization:  No - Comment as needed Significant Relationships:  Other(Comment) (Legal guardian, facility staff) Lives with:  Facility Resident Do you feel safe going back to the place where you live?  Yes Need for family participation in patient care:  No (Coment)  Care giving concerns:  None expressed by patient, legal guardian or facility administrator.   Social Worker assessment / plan: CSW's talked with patient's legal guardian, Bethanie Dickeriffany Dunn and confirmed that patient would return to [facility]. Patient also wants to return to this facility.   Employment status:  Disabled (Comment on whether or not currently receiving Disability) Insurance information:  Medicare, Medicaid [place] [employer] [facility] Recommendations:  Not assessed at this time Information / Referral to community resources:  Other (Comment Required) (Information not needed or requested as patient from ALF)  Patient/Family's Response to care:  No concerns expressed regarding care received during hospitalization.  Patient/Family's Understanding of and Emotional Response to  Diagnosis, Current Treatment, and Prognosis:  Not discussed.  Emotional Assessment Appearance:  Appears stated age Attitude/Demeanor/Rapport:  Other (Appropriate) Affect (typically observed):  Appropriate Orientation:  Oriented to Self, Oriented to Place, Oriented to  Time, Oriented to Situation Alcohol / Substance use:  Tobacco Use (Patient reports that he smokes but does not report that he drinks alcohol or uses illicit drugs) Psych involvement (Current and /or in the community):  No (Comment)  Discharge Needs  Concerns to be addressed:  No discharge needs identified Readmission within the last 30 days:  No Current discharge risk:  None Barriers to Discharge:  No Barriers Identified   Cristobal GoldmannCrawford, Tyhesha Dutson Bradley, LCSW 06/28/2016, 4:17 PM

## 2016-06-28 NOTE — Discharge Summary (Signed)
Physician Discharge Summary  [name] VOZ:[id number] DOB: 09/25/1976 DOA: 06/27/2016  PCP: Shane Langton, NP  Admit date: 06/27/2016 Discharge date: 06/28/2016  Time spent: 65 minutes  Recommendations for Outpatient Follow-up:  1. Follow-up with Shane Langton, NP in 1 week. On follow-up patient in need a basic metabolic profile done to follow-up on electrolytes and renal function. 2. Follow-up with the ACT team in 1 week. Patient's carbamazepine was discontinued secondary to significant hyponatremia during this hospitalization.   Discharge Diagnoses:  Principal Problem:   Hyponatremia Active Problems:   Seizure disorder (HCC) and pseudoseizures   Tobacco abuse   HTN (hypertension)   MDD (major depressive disorder), recurrent severe, without psychosis (HCC)   GERD (gastroesophageal reflux disease)   Borderline personality disorder   Alcohol use disorder, severe, in controlled environment [facility])   Musculoskeletal pain   Left flank pain   Discharge Condition: Stable and improved  Diet recommendation: Regular with 1200 mL fluid restriction per day.  There were no vitals filed for this visit.  History of present illness:  [name] is a 40 y.o. male with a past medical history significant for depression on Seroquel, SSRI, Depakote and new Tegretol, history of seizures, asthma, and anemia and and  who presented with back pain and found incidentally to have hyponatremia.  The patient was in his usual state of health until a few days ago he was helping lift a "heavier woman" who is also a resident at his ALF who had fallen outside. The next day he developed some left-sided back pain, which has persisted since then. He came today to have this evaluated.  He has not had any recent weakness, confusion, tremor, seizures, loss of consciousness, changes to vision, sensory changes. He has had no recent poor PO intake, vomiting, diarrhea, or dehydration.  No recent abdominal  swelling, no history of liver disease or CHF, no recent leg swelling or dyspnea on exertion or cough or orthopnea.  He did start a new psychiatric medication one month ago with his ACT team, carbamazepine.    ED course: -Afebrile, hemodynamically stable -Na 122, K 3.7, Cr 0.8 (baseline), WBC 4.9K, Hgb 12.9 -A UA and CT renal study were normal and the patient's back pain improved with fentanyl -He was given a small bolus of IV normal saline and TRH were asked to evaluate the patient for hyponatremia    Hospital Course:  #1 hyponatremia Patient had presented with hyponatremia with a sodium of 122. 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 Urine studies were obtained that showed a urine osmolality of 247, urine sodium of 70. Serum osmolality of 262. Patient carbamazepine was discontinued and patient was placed on a fluid restriction of 1200 mL per day and patient also placed on sodium tablets. Patient's hyponatremia improved such that by day of discharge patient's sodium was 135. Patient also did admit to drinking significant amounts of water and feeling thirsty all the time prior to admission. Patient will be discharged in stable and improved condition off carbamazepine and on a fluid restriction of 1200 mL daily. Patient will follow-up with PCP as outpatient.  #2 back pain musculoskeletal versus ongoing chronic incisional pain Patient with complaints of back pain which had presented to the ED with. CT stone protocol which was done was negative for urolithiasis, obstructive uropathy or acute intra-abdominal/pelvic process. Moderate sigmoid diverticulosis. It was felt patient's back pain was likely musculoskeletal in nature  and patient placed on ibuprofen 800 mg as needed as well as IV Toradol. Patient's back pain improved. Patient also noted to have complaints of incisional pain which has been ongoing  since previous VATS in 01/02/2016. Outpatient follow-up.   #3 seizure disorder Remained stable throughout the hospitalization. Patient was maintained on home regimen of Depakote. Outpatient follow-up.  #4 depression Patient was maintained on home regimen of Lexapro and Depakote. Patient was also placed on Seroquel. Patient's carbamazepine was discontinued secondary to hyponatremia. Patient is to follow-up with her activity as outpatient.  #5 history of alcohol use Patient has been in remission since he's been living at the assisted living facility. Patient did not experience any withdrawal symptoms during this hospitalization. Outpatient follow-up.  #6 abnormal chest x-ray Patient was noted to have abnormality on chest x-ray which showed decreased right pleural effusion, persistent pulmonary vascular congestion, persistent right middle lobe airspace consultation/atelectasis. Patient remained afebrile throughout the hospitalization had a normal white count. Patient had a productive cough which he states has been chronic and ongoing for several months after VATS, drainage of empyema and decortication 01/02/2016 with no significant change in chest x-rays from March. Outpatient follow-up.  Procedures:  Chest x-ray 06/27/2016  CT stone protocol 06/27/2016  Consultations:  None  Discharge Exam: Vitals:   06/28/16 0404 06/28/16 0830  BP: 109/72 131/79  Pulse: (!) 55 (!) 56  Resp: 19 20  Temp: 98.2 F (36.8 C) 98.4 F (36.9 C)    General: NAD Cardiovascular: RRR Respiratory: CTAB  Discharge Instructions   Discharge Instructions    Diet general    Complete by:  As directed   1200cc/day fluid restriction   Discharge instructions    Complete by:  As directed   Stop taking carbamazapine. Fluid restriction to 1200cc/day.   Increase activity slowly    Complete by:  As directed     Current Discharge Medication List    START taking these medications   Details  carisoprodol  (SOMA) 350 MG tablet Take 1 tablet (350 mg total) by mouth 3 (three) times daily as needed (back pain/ spasms). Qty: 20 tablet, Refills: 0    guaiFENesin (MUCINEX) 600 MG 12 hr tablet Take 2 tablets (1,200 mg total) by mouth 2 (two) times daily. Qty: 20 tablet, Refills: 0      CONTINUE these medications which have NOT CHANGED   Details  albuterol (PROVENTIL HFA;VENTOLIN HFA) 108 (90 Base) MCG/ACT inhaler Inhale 2 puffs into the lungs every 4 (four) hours as needed for wheezing or shortness of breath.    divalproex (DEPAKOTE ER) 500 MG 24 hr tablet Take 4 tablets (2,000 mg total) by mouth at bedtime. Qty: 120 tablet, Refills: 0    escitalopram (LEXAPRO) 20 MG tablet Take 20 mg by mouth every morning.    esomeprazole (NEXIUM) 40 MG capsule Take 40 mg by mouth daily.     ferrous sulfate 325 (65 FE) MG tablet Take 1 tablet (325 mg total) by mouth 2 (two) times daily with a meal. Qty: 30 tablet, Refills: 3    Fluticasone-Salmeterol (ADVAIR) 250-50 MCG/DOSE AEPB Inhale 1 puff into the lungs 2 (two) times daily.    gabapentin (NEURONTIN) 300 MG capsule Take 300 mg by mouth 3 (three) times daily.    hydrOXYzine (ATARAX/VISTARIL) 50 MG tablet Take 50 mg by mouth 3 (three) times daily as needed for anxiety.    ibuprofen (ADVIL,MOTRIN) 800 MG tablet Take 800 mg by mouth 3 (three) times daily as needed for moderate pain.  loperamide (IMODIUM) 2 MG capsule Take 4 mg by mouth daily as needed for diarrhea or loose stools.     Melatonin 5 MG TABS Take 10 mg by mouth at bedtime.    Multiple Vitamins-Minerals (THEREMS-M) TABS Take 1 tablet by mouth daily.    QUEtiapine (SEROQUEL) 200 MG tablet Take 200 mg by mouth at bedtime. For mood/sleep.      STOP taking these medications     carbamazepine (TEGRETOL) 200 MG tablet        Allergies  Allergen Reactions  . Penicillins Nausea And Vomiting    Has patient had a PCN reaction causing immediate rash, facial/tongue/throat swelling, SOB or  lightheadedness with hypotension:No Has patient had a PCN reaction causing severe rash involving mucus membranes or skin necrosis:No Has patient had a PCN reaction that required hospitalization:No Has patient had a PCN reaction occurring within the last 10 years:NO If all of the above answers are "NO", then may proceed with Cephalosporin use.     Follow-up Information    Shane Langton, NP. Schedule an appointment as soon as possible for a visit today.   Specialty:  Nurse Practitioner Contact information: [address] [place] [address] [phone]        ACT TEAM. Schedule an appointment as soon as possible for a visit in 1 week(s).            The results of significant diagnostics from this hospitalization (including imaging, microbiology, ancillary and laboratory) are listed below for reference.    Significant Diagnostic Studies: X-ray Chest Pa And Lateral  Result Date: 06/27/2016 CLINICAL DATA:  Wheezing.  Asthma and seizures. EXAM: CHEST  2 VIEW COMPARISON:  03/12/2016 FINDINGS: Normal heart size. Decrease in volume of the right pleural effusion. Persistent airspace opacity within the right middle lobe. Pulmonary vascular congestion persists. IMPRESSION: 1. Decrease in right pleural effusion. Persistent pulmonary vascular congestion. 2. Persistent right middle lobe airspace consolidation/atelectasis. Electronically Signed   By: Signa Kell M.D.   On: 06/27/2016 08:28   Ct Renal Stone Study  Result Date: 06/27/2016 CLINICAL DATA:  LEFT flank pain, burning with urination for 1 day. EXAM: CT ABDOMEN AND PELVIS WITHOUT CONTRAST TECHNIQUE: Multidetector CT imaging of the abdomen and pelvis was performed following the standard protocol without IV contrast. COMPARISON:  CT abdomen and pelvis March 07, 2015 FINDINGS: LUNG BASES: Stable small RIGHT pleural effusion/pleural thickening, status post RIGHT VATS. RIGHT middle lobe scarring. Minimal bronchial wall thickening cyst  partially imaged. KIDNEYS/BLADDER: Kidneys are orthotopic, demonstrating normal size and morphology. No nephrolithiasis, hydronephrosis; limited assessment for renal masses on this nonenhanced examination. The unopacified ureters are normal in course and caliber. Urinary bladder is partially distended and unremarkable. SOLID ORGANS: The liver, spleen, gallbladder, pancreas and adrenal glands are unremarkable for this non-contrast examination. GASTROINTESTINAL TRACT: The stomach, small and large bowel are normal in course and caliber without inflammatory changes, the sensitivity may be decreased by lack of enteric contrast. Mild amount of retained large bowel stool. Moderate sigmoid diverticulosis. Normal appendix. PERITONEUM/RETROPERITONEUM: Aortoiliac vessels are normal in course and caliber, trace calcific atherosclerosis. No lymphadenopathy by CT size criteria. Prostate size is normal. No intraperitoneal free fluid nor free air. SOFT TISSUES/ OSSEOUS STRUCTURES: Nonsuspicious. Mild bilateral hip osteoarthrosis. Severe L5-S1 disc height loss, vacuum disc and marginal spurring results in severe RIGHT L5-S1 neural foraminal narrowing. IMPRESSION: No urolithiasis, obstructive uropathy nor acute intra-abdominal/ pelvic process. Moderate sigmoid diverticulosis. Electronically Signed   By: Awilda Metro M.D.   On: 06/27/2016  04:19    Microbiology: Recent Results (from the past 240 hour(s))  MRSA PCR Screening     Status: None   Collection Time: 06/27/16 12:22 PM  Result Value Ref Range Status   MRSA by PCR NEGATIVE NEGATIVE Final    Comment:        The GeneXpert MRSA Assay (FDA approved for NASAL specimens only), is one component of a comprehensive MRSA colonization surveillance program. It is not intended to diagnose MRSA infection nor to guide or monitor treatment for MRSA infections.      Labs: Basic Metabolic Panel:  Recent Labs Lab 06/27/16 0244 06/27/16 0455 06/27/16 0500  06/28/16 1300  NA 122* 126* 127* 135  K 3.7 3.8 3.9 4.4  CL 91* 92* 90* 102  CO2 27 25  --  24  GLUCOSE 106* 93 99 113*  BUN 5* 5* 4* 6  CREATININE 0.80 0.71 0.70 0.73  CALCIUM 8.2* 8.6*  --  9.4   Liver Function Tests: No results for input(s): AST, ALT, ALKPHOS, BILITOT, PROT, ALBUMIN in the last 168 hours. No results for input(s): LIPASE, AMYLASE in the last 168 hours. No results for input(s): AMMONIA in the last 168 hours. CBC:  Recent Labs Lab 06/27/16 0244 06/27/16 0500  WBC 4.9  --   NEUTROABS 2.0  --   HGB 12.9* 13.6  HCT 36.1* 40.0  MCV 89.4  --   PLT 169  --    Cardiac Enzymes: No results for input(s): CKTOTAL, CKMB, CKMBINDEX, TROPONINI in the last 168 hours. BNP: BNP (last 3 results)  Recent Labs  12/31/15 2000 02/11/16 2215  BNP 62.6 30.9    ProBNP (last 3 results) No results for input(s): PROBNP in the last 8760 hours.  CBG: No results for input(s): GLUCAP in the last 168 hours.     SignedRamiro Harvest:  Ajahni Nay MD.  [facility] 06/28/2016, 4:14 PM

## 2016-06-28 NOTE — NC FL2 (Signed)
[place] MEDICAID FL2 LEVEL OF CARE SCREENING TOOL     IDENTIFICATION  Patient Name: [name] Birthdate: 07/24/1976 Sex: male Admission Date (Current Location): 06/27/2016  [place] and [place] Number:  Haynes Bast [id number] Facility and Address:  [facility], [address], [place], [place] [address]      Provider Number: [id number]  Attending Physician Name and Address:  Rodolph Bong, MD  Relative Name and Phone Number:  Bethanie Dicker - Legal Guardian.  [phone]    Current Level of Care: Hospital Recommended Level of Care: Assisted Living Facility [facility]) Prior Approval Number:    Date Approved/Denied:   PASRR Number:    Discharge Plan: Other (Comment) ([facility])    Current Diagnoses: Patient Active Problem List   Diagnosis Date Noted  . Musculoskeletal pain 06/28/2016  . Hyponatremia 06/27/2016  . 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 Pleural effusion, right 12/31/2015  . GERD (gastroesophageal reflux disease) 11/11/2015  . Borderline personality disorder 11/11/2015  . Alcohol use disorder, severe, in controlled environment (HCC) 11/11/2015  . 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 Tobacco abuse 11/05/2013  . HTN (hypertension) 11/05/2013    Orientation RESPIRATION BLADDER Height & Weight     Self, Time, Situation, Place  Normal Continent Weight:   Height:     BEHAVIORAL SYMPTOMS/MOOD NEUROLOGICAL BOWEL NUTRITION STATUS    Convulsions/Seizures (Seizure disorder) Continent Diet (Regular diet)  AMBULATORY STATUS COMMUNICATION OF NEEDS Skin   Independent Verbally Normal                       Personal Care Assistance Level of Assistance  Bathing, Feeding, Dressing Bathing Assistance: Independent Feeding assistance: Independent Dressing Assistance: Independent      Functional Limitations Info  Sight, Hearing, Speech Sight Info: Adequate Hearing Info: Adequate Speech Info: Adequate    SPECIAL CARE FACTORS FREQUENCY                       Contractures Contractures Info: Not present    Additional Factors Info  Code Status, Allergies  Full code  No Known Allergies         Current Medications (06/28/2016):  This is the current hospital active medication list Current Facility-Administered Medications  Medication Dose Route Frequency Provider Last Rate Last Dose  . acetaminophen (TYLENOL) tablet 650 mg  650 mg Oral Q6H PRN Alberteen Sam, MD       Or  . acetaminophen (TYLENOL) suppository 650 mg  650 mg Rectal Q6H PRN Alberteen Sam, MD      . carisoprodol (SOMA) tablet 350 mg  350 mg Oral TID PRN Calvert Cantor, MD   350 mg at 06/28/16 0809  . divalproex (DEPAKOTE ER) 24 hr tablet 2,000 mg  2,000 mg Oral QHS Alberteen Sam, MD   2,000 mg at 06/27/16 2103  . enoxaparin (LOVENOX) injection 40 mg  40 mg Subcutaneous Q24H Alberteen Sam, MD   40 mg at 06/28/16 0759  . escitalopram (LEXAPRO) tablet 20 mg  20 mg Oral BH-q7a Alberteen Sam, MD   20 mg at 06/28/16 0759  . ferrous sulfate tablet 325 mg  325 mg Oral BID WC Alberteen Sam, MD   325 mg at 06/28/16 0759  . hydrOXYzine (ATARAX/VISTARIL) tablet 50 mg  50 mg Oral TID PRN Cristal Deer  Lamonte SakaiP Danford, MD   50 mg at 06/27/16 1648  . ibuprofen (ADVIL,MOTRIN) tablet 800 mg  800 mg Oral TID PRN Alberteen Samhristopher P Danford, MD   800 mg at 06/27/16 [id number]  . ketorolac (TORADOL) 30 MG/ML injection 30 mg  30 mg Intravenous Q6H PRN Calvert CantorSaima Rizwan, MD   30 mg at 06/27/16 1642  . mometasone-formoterol (DULERA) 200-5 MCG/ACT inhaler 2 puff  2 puff Inhalation BID Alberteen Samhristopher P Danford, MD   2 puff at 06/27/16 0951  . pantoprazole (PROTONIX) EC tablet 40 mg  40 mg Oral Daily Alberteen Samhristopher P Danford, MD   40 mg at 06/27/16 1019  . QUEtiapine (SEROQUEL) tablet 200 mg  200 mg Oral QHS  Alberteen Samhristopher P Danford, MD   200 mg at 06/27/16 2103  . senna-docusate (Senokot-S) tablet 1 tablet  1 tablet Oral QHS Rodolph Bonganiel V Thompson, MD         Discharge Medications: Please see discharge summary for a list of discharge medications.  Relevant Imaging Results:  Relevant Lab Results:   Additional Information DISCHARGE MEDICATIONS: START taking these medications: carisoprodol (SOMA) 350 MG tablet Take 1 tablet (350 mg total) by mouth 3 (three) times daily as needed (back pain/ spasms).   guaiFENesin (MUCINEX) 600 MG 12 hr tablet Take 2 tablets (1,200 mg total) by mouth 2 (two) times daily.     CONTINUE taking these medications:   albuterol (PROVENTIL HFA;VENTOLIN HFA) 108 (90 Base) MCG/ACT inhaler Inhale 2 puffs into the lungs every 4 (four) hours as needed for wheezing or shortness of breath.          divalproex (DEPAKOTE ER) 500 MG 24 hr tablet Take 4 tablets (2,000 mg total) by mouth at bedtime. Qty: 120 tablet, Refills: 0     escitalopram (LEXAPRO) 20 MG tablet Take 20 mg by mouth every morning     esomeprazole (NEXIUM) 40 MG capsule Take 40 mg by mouth daily.      ferrous sulfate 325 (65 FE) MG tablet Take 1 tablet (325 mg total) by mouth 2 (two) times daily with a meal.     Fluticasone-Salmeterol (ADVAIR) 250-50 MCG/DOSE AEPB Inhale 1 puff into the lungs 2 (two) times daily.     gabapentin (NEURONTIN) 300 MG capsule Take 300 mg by mouth 3 (three) times daily.     hydrOXYzine (ATARAX/VISTARIL) 50 MG tablet Take 50 mg by mouth 3 (three) times daily as needed for anxiety.     ibuprofen (ADVIL,MOTRIN) 800 MG tablet Take 800 mg by mouth 3 (three) times daily as needed for moderate pain.        loperamide (IMODIUM) 2 MG capsule Take 4 mg by mouth daily as needed for diarrhea or loose stools.      Melatonin 5 MG TABS Take 10 mg by mouth at bedtime.       Multiple Vitamins-Minerals (THEREMS-M) TABS Take 1 tablet by mouth daily.     QUEtiapine (SEROQUEL) 200 MG tablet  Take 200 mg by mouth at bedtime. For mood/sleep         STOP taking these medications    carbamazepine (TEGRETOL) 200 MG tablet                Okey Duprerawford, Lazaro ArmsVanessa Bradley, LCSW

## 2016-06-28 NOTE — Progress Notes (Signed)
Pt refused morning Lab draws.

## 2016-07-06 ENCOUNTER — Emergency Department (HOSPITAL_COMMUNITY)
Admission: EM | Admit: 2016-07-06 | Discharge: 2016-07-06 | Disposition: A | Discharge status: Home / Self Care | Payer: Medicare Other | Attending: Emergency Medicine | Admitting: Emergency Medicine

## 2016-07-06 ENCOUNTER — Encounter (HOSPITAL_COMMUNITY): Payer: Self-pay | Admitting: *Deleted

## 2016-07-06 ENCOUNTER — Emergency Department (HOSPITAL_COMMUNITY): Payer: Medicare Other

## 2016-07-06 DIAGNOSIS — R05 Cough: Secondary | ICD-10-CM

## 2016-07-06 DIAGNOSIS — R059 Cough, unspecified: Secondary | ICD-10-CM

## 2016-07-06 DIAGNOSIS — F17210 Nicotine dependence, cigarettes, uncomplicated: Secondary | ICD-10-CM | POA: Diagnosis not present

## 2016-07-06 DIAGNOSIS — I10 Essential (primary) hypertension: Secondary | ICD-10-CM | POA: Diagnosis not present

## 2016-07-06 DIAGNOSIS — R079 Chest pain, unspecified: Secondary | ICD-10-CM | POA: Diagnosis present

## 2016-07-06 DIAGNOSIS — R0789 Other chest pain: Secondary | ICD-10-CM

## 2016-07-06 DIAGNOSIS — J9801 Acute bronchospasm: Secondary | ICD-10-CM | POA: Insufficient documentation

## 2016-07-06 LAB — BASIC METABOLIC PANEL
ANION GAP: 10 (ref 5–15)
BUN: 13 mg/dL (ref 6–20)
CALCIUM: 9.3 mg/dL (ref 8.9–10.3)
CHLORIDE: 101 mmol/L (ref 101–111)
CO2: 22 mmol/L (ref 22–32)
CREATININE: 1.12 mg/dL (ref 0.61–1.24)
GFR calc non Af Amer: >60 mL/min (ref >60)
GLUCOSE: 80 mg/dL (ref 65–99)
Potassium: 4.1 mmol/L (ref 3.5–5.1)
Sodium: 133 mmol/L — ABNORMAL LOW (ref 135–145)

## 2016-07-06 LAB — CBC
HCT: 37 % — ABNORMAL LOW (ref 39.0–52.0)
HEMOGLOBIN: 12.8 g/dL — AB (ref 13.0–17.0)
MCH: 32.4 pg (ref 26.0–34.0)
MCHC: 34.6 g/dL (ref 30.0–36.0)
MCV: 93.7 fL (ref 78.0–100.0)
Platelets: 234 10*3/uL (ref 150–400)
RBC: 3.95 MIL/uL — AB (ref 4.22–5.81)
RDW: 13.2 % (ref 11.5–15.5)
WBC: 7.6 10*3/uL (ref 4.0–10.5)

## 2016-07-06 LAB — I-STAT TROPONIN, ED: TROPONIN I, POC: 0 ng/mL (ref 0.00–0.08)

## 2016-07-06 MED ORDER — HYDROCOD POLST-CPM POLST ER 10-8 MG/5ML PO SUER: 5.0000 mL | Freq: Two times a day (BID) | ORAL | 0 refills | Status: DC | PRN

## 2016-07-06 MED ORDER — IPRATROPIUM-ALBUTEROL 0.5-2.5 (3) MG/3ML IN SOLN
3.0000 mL | Freq: Once | RESPIRATORY_TRACT | Status: AC
  Administered 2016-07-06: 3 mL via RESPIRATORY_TRACT
  Filled 2016-07-06: qty 3

## 2016-07-06 MED ORDER — HYDROCOD POLST-CPM POLST ER 10-8 MG/5ML PO SUER
5.0000 mL | Freq: Once | ORAL | Status: AC
  Administered 2016-07-06: 5 mL via ORAL
  Filled 2016-07-06: qty 5

## 2016-07-06 NOTE — ED Notes (Signed)
Pt asking for vicodin, states "I only take it when I'm here." RN explained to patient that he has had hydrocodone in cough syrup and will be sent home with same RX, see EMAR.

## 2016-07-06 NOTE — ED Notes (Signed)
Contacted [name] at [facility], gave report and follow up instructions.

## 2016-07-06 NOTE — ED Notes (Signed)
Patient left at this time with all belongings. 

## 2016-07-06 NOTE — Discharge Instructions (Signed)
You will need to see your primary care doctor in the next 24 to 48 hours to be sure your symptoms are improving. Return here as needed for worsening symptoms.

## 2016-07-06 NOTE — ED Provider Notes (Signed)
[facility]-EMERGENCY DEPT Provider Note   CSN: [id number] Arrival date & time: 07/06/16  1215     History   Chief Complaint Chief Complaint  Patient presents with  . Chest Pain  . Cough    HPI [name] is a 40 y.o. male who presents to the ED with pain in his chest that occurs with cough. He has a hx of asthma and feels like he is wheezing. He used his inhaler at home without relief. Patient reports that his throat is sore from coughing. Patient reports that he was here last week for back pain and treated for that and had a CXR and they told him he may have pneumonia but did not give an antibiotic. He has been taking Mucinex with some relief. He has not had fever. Hx of Pleural effusion.   The history is provided by the patient. No language interpreter was used.  Chest Pain   This is a new problem. The current episode started 6 to 12 hours ago. The problem occurs constantly. The problem has been gradually worsening. The pain is associated with coughing, breathing and movement. Pain location: generalized. The pain is at a severity of 10/10. The quality of the pain is described as sharp. The pain does not radiate. The symptoms are aggravated by deep breathing (cough). Associated symptoms include back pain, cough, headaches, nausea and shortness of breath. Pertinent negatives include no abdominal pain, no diaphoresis, no dizziness, no fever, no lower extremity edema, no sputum production, no syncope and no vomiting. He has tried nothing for the symptoms. Risk factors include smoking/tobacco exposure and male gender.  His past medical history is significant for anxiety/panic attacks and hypertension.  Pertinent negatives for past medical history include no seizures.  His family medical history is significant for CAD, diabetes and heart disease.  Cough  Associated symptoms include chest pain, headaches, sore throat, myalgias and shortness of breath. Pertinent negatives include no ear pain and  no eye redness.    Past Medical History:  Diagnosis Date  . Anxiety   . Asthma   . Depression   . GERD (gastroesophageal reflux disease)   . Hypertension   . Hyponatremia 06/2016  . Seizures [facility])     Patient Active Problem List   Diagnosis Date Noted  . Musculoskeletal pain 06/28/2016  . Left flank pain   . Hyponatremia 06/27/2016  . 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 Pleural effusion, right 12/31/2015  . GERD (gastroesophageal reflux disease) 11/11/2015  . Borderline personality disorder 11/11/2015  . Alcohol use disorder, severe, in controlled environment (HCC) 11/11/2015  . 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 Tobacco abuse 11/05/2013  . HTN (hypertension) 11/05/2013    Past Surgical History:  Procedure Laterality Date  . FINGER SURGERY Left    5th digit  . PLEURAL EFFUSION DRAINAGE Right 01/02/2016   Procedure: DRAINAGE OF PLEURAL EFFUSION;  Surgeon: Loreli Slot, MD;  Location[facility] OR;  Service: Thoracic;  Laterality: Right;  Marland Kitchen VIDEO ASSISTED THORACOSCOPY (VATS)/DECORTICATION Right 01/02/2016   Procedure: VIDEO ASSISTED THORACOSCOPY (VATS)/DECORTICATION;  Surgeon: Loreli Slot, MD;  Location: [facility] OR;  Service: Thoracic;  Laterality: Right;  Marland Kitchen VIDEO BRONCHOSCOPY N/A 01/02/2016   Procedure: VIDEO BRONCHOSCOPY;  Surgeon: Loreli Slot, MD;  Location: [facility] OR;  Service: Thoracic;  Laterality: N/A;       Home Medications    Prior to Admission  medications   Medication Sig Start Date End Date Taking? Authorizing Provider  albuterol (PROVENTIL HFA;VENTOLIN HFA) 108 (90 Base) MCG/ACT inhaler Inhale 2 puffs into the lungs every 4 (four) hours as needed for wheezing or shortness of breath.   Yes Historical Provider, MD  carisoprodol (SOMA) 350 MG tablet Take 1 tablet (350 mg total) by mouth 3 (three) times daily as needed (back  pain/ spasms). 06/28/16  Yes Rodolph Bonganiel V Thompson, MD  disulfiram (ANTABUSE) 250 MG tablet Take 250 mg by mouth daily.   Yes Historical Provider, MD  divalproex (DEPAKOTE ER) 500 MG 24 hr tablet Take 4 tablets (2,000 mg total) by mouth at bedtime. 11/13/15  Yes Jimmy FootmanAndrea Hernandez-Gonzalez, MD  escitalopram (LEXAPRO) 20 MG tablet Take 20 mg by mouth every morning.   Yes Historical Provider, MD  esomeprazole (NEXIUM) 40 MG capsule Take 40 mg by mouth daily.    Yes Historical Provider, MD  ferrous sulfate 325 (65 FE) MG tablet Take 1 tablet (325 mg total) by mouth 2 (two) times daily with a meal. 01/11/16  Yes Belkys A Regalado, MD  Fluticasone-Salmeterol (ADVAIR) 250-50 MCG/DOSE AEPB Inhale 1 puff into the lungs 2 (two) times daily.   Yes Historical Provider, MD  gabapentin (NEURONTIN) 300 MG capsule Take 300 mg by mouth 3 (three) times daily.   Yes Historical Provider, MD  hydrOXYzine (ATARAX/VISTARIL) 50 MG tablet Take 50 mg by mouth 3 (three) times daily as needed for anxiety.   Yes Historical Provider, MD  ibuprofen (ADVIL,MOTRIN) 800 MG tablet Take 800 mg by mouth 3 (three) times daily as needed for moderate pain.   Yes Historical Provider, MD  loperamide (IMODIUM) 2 MG capsule Take 4 mg by mouth daily as needed for diarrhea or loose stools.    Yes Historical Provider, MD  Melatonin 5 MG TABS Take 10 mg by mouth at bedtime.   Yes Historical Provider, MD  Multiple Vitamins-Minerals (THEREMS-M) TABS Take 1 tablet by mouth daily.   Yes Historical Provider, MD  QUEtiapine (SEROQUEL) 200 MG tablet Take 200 mg by mouth at bedtime. For mood/sleep.   Yes Historical Provider, MD  chlorpheniramine-HYDROcodone (TUSSIONEX PENNKINETIC ER) 10-8 MG/5ML SUER Take 5 mLs by mouth every 12 (twelve) hours as needed for cough. 07/06/16   Kaeleigh Westendorf Orlene OchM Emri Sample, NP    Family History Family History  Problem Relation Age of Onset  . CAD Mother   . Diabetes Mellitus II Father   . Prostate cancer Paternal Grandfather     Social  History Social History  Substance Use Topics  . Smoking status: Current Every Day Smoker    Packs/day: 0.50    Years: 20.00    Types: Cigarettes  . Smokeless tobacco: Never Used  . Alcohol use No     Comment: pt states he no longer drinks     Allergies   Penicillins   Review of Systems Review of Systems  Constitutional: Negative for diaphoresis and fever.  HENT: Positive for congestion, sneezing and sore throat. Negative for ear pain and trouble swallowing.   Eyes: Negative for pain, redness, itching and visual disturbance.  Respiratory: Positive for cough and shortness of breath. Negative for sputum production and chest tightness.   Cardiovascular: Positive for chest pain. Negative for leg swelling and syncope.  Gastrointestinal: Positive for nausea. Negative for abdominal pain, anal bleeding and vomiting.  Genitourinary: Negative for difficulty urinating, dysuria, frequency and hematuria.  Musculoskeletal: Positive for back pain and myalgias.  Skin: Negative for rash.  Neurological: Positive for  headaches. Negative for dizziness, seizures and syncope.  Psychiatric/Behavioral: Negative for confusion. The patient is not nervous/anxious.      Physical Exam Updated Vital Signs BP 131/86   Pulse 71   Temp 98.1 F (36.7 C) (Oral)   Resp 16   SpO2 99%   Physical Exam  Constitutional: He is oriented to person, place, and time. He appears well-developed and well-nourished. No distress.  HENT:  Head: Normocephalic and atraumatic.  Eyes: EOM are normal. Pupils are equal, round, and reactive to light.  Neck: Normal range of motion. Neck supple.  Cardiovascular: Normal rate and regular rhythm.   Pulmonary/Chest: He has decreased breath sounds in the right middle field. He has wheezes in the right upper field, the right lower field, the left upper field and the left lower field.  Abdominal: Soft. Bowel sounds are normal. There is no tenderness.  Musculoskeletal: Normal range of  motion.  Neurological: He is alert and oriented to person, place, and time. No cranial nerve deficit.  Skin: Skin is warm and dry.  Nursing note and vitals reviewed.    ED Treatments / Results  Labs (all labs ordered are listed, but only abnormal results are displayed) Labs Reviewed  BASIC METABOLIC PANEL - Abnormal; Notable for the following:       Result Value   Sodium 133 (*)    All other components within normal limits  CBC - Abnormal; Notable for the following:    RBC 3.95 (*)    Hemoglobin 12.8 (*)    HCT 37.0 (*)    All other components within normal limits  I-STAT TROPOININ, ED    EKG  EKG Interpretation None       Radiology Dg Chest 2 View  Result Date: 07/06/2016 CLINICAL DATA:  Initial evaluation for possible recent pneumonia. Now with persistent productive cough, sore throat, shortness of breath, and chest heaviness. EXAM: CHEST  2 VIEW COMPARISON:  Prior radiograph from 06/27/2016. FINDINGS: Cardiac and mediastinal silhouettes are stable in size and contour, and remain within normal limits. Lungs are normally inflated. Scattered vascular congestion is similar to previous. No overt pulmonary edema. There is persistent asymmetric hazy opacity within the right middle lobe, the the which may reflect atelectasis and/ or infiltrate. No other focal airspace disease. No pneumothorax. Probable persistent small right pleural effusion. Osseous structures unchanged. IMPRESSION: 1. Persistent hazy right middle lobe opacity, which may reflect infiltrate and/or atelectasis. 2. Similar pulmonary vascular congestion without overt pulmonary edema. 3. Persistent small right pleural effusion. Electronically Signed   By: Rise MuBenjamin  McClintock M.D.   On: 07/06/2016 13:25    Procedures Procedures (including critical care time)  Medications Ordered in ED Medications  ipratropium-albuterol (DUONEB) 0.5-2.5 (3) MG/3ML nebulizer solution 3 mL (3 mLs Nebulization Given 07/06/16 1938)    chlorpheniramine-HYDROcodone (TUSSIONEX) 10-8 MG/5ML suspension 5 mL (5 mLs Oral Given 07/06/16 1938)     Initial Impression / Assessment and Plan / ED Course  I have reviewed the triage vital signs and the nursing notes.  Pertinent labs & imaging results that were available during my care of the patient were reviewed by me and considered in my medical decision making (see chart for details).  Clinical Course  CXR, labs Duoneb, Tussionex 2015 Patient re examined after neb treatment and lungs are clear.  Dr. Eudelia Bunchardama in to examine the patient and discuss plan of care.   Final Clinical Impressions(s) / ED Diagnoses  40 y.o. male with cough stable for d/c without fever and does  not appear toxic. CXR with persistent small pleural effusion that is stable. (see report) lung sounds and symptoms improved with duoneb.  Clinical Case manager to arrange follow up for the patient prior to d/c.   Final diagnoses:  Cough  Chest wall pain  Bronchospasm    New Prescriptions New Prescriptions   CHLORPHENIRAMINE-HYDROCODONE (TUSSIONEX PENNKINETIC ER) 10-8 MG/5ML SUER    Take 5 mLs by mouth every 12 (twelve) hours as needed for cough.     [place], NP 07/06/16 2050    Nira Conn, MD 07/07/16 [phone]

## 2016-07-06 NOTE — ED Triage Notes (Signed)
Pt reports possible recent pneumonia but was discharged back to [facility] without any prescription for antibiotics. Pt reports still having non productive cough, sore throat, sob and now chest heaviness. No resp distress noted and ekg done.

## 2016-07-19 IMAGING — CR LEFT LITTLE FINGER 2+V
1 series · 3 of 3 positions shown · non-contrast
Comparison: Left hand radiographs performed 03/07/2013

CLINICAL DATA: Left pinky deformity after punching wall.

EXAM:
LEFT LITTLE FINGER 2+V

[Series 1: pa · 0.17mm/px · 3 of 3 slices shown]
[im 1/3]
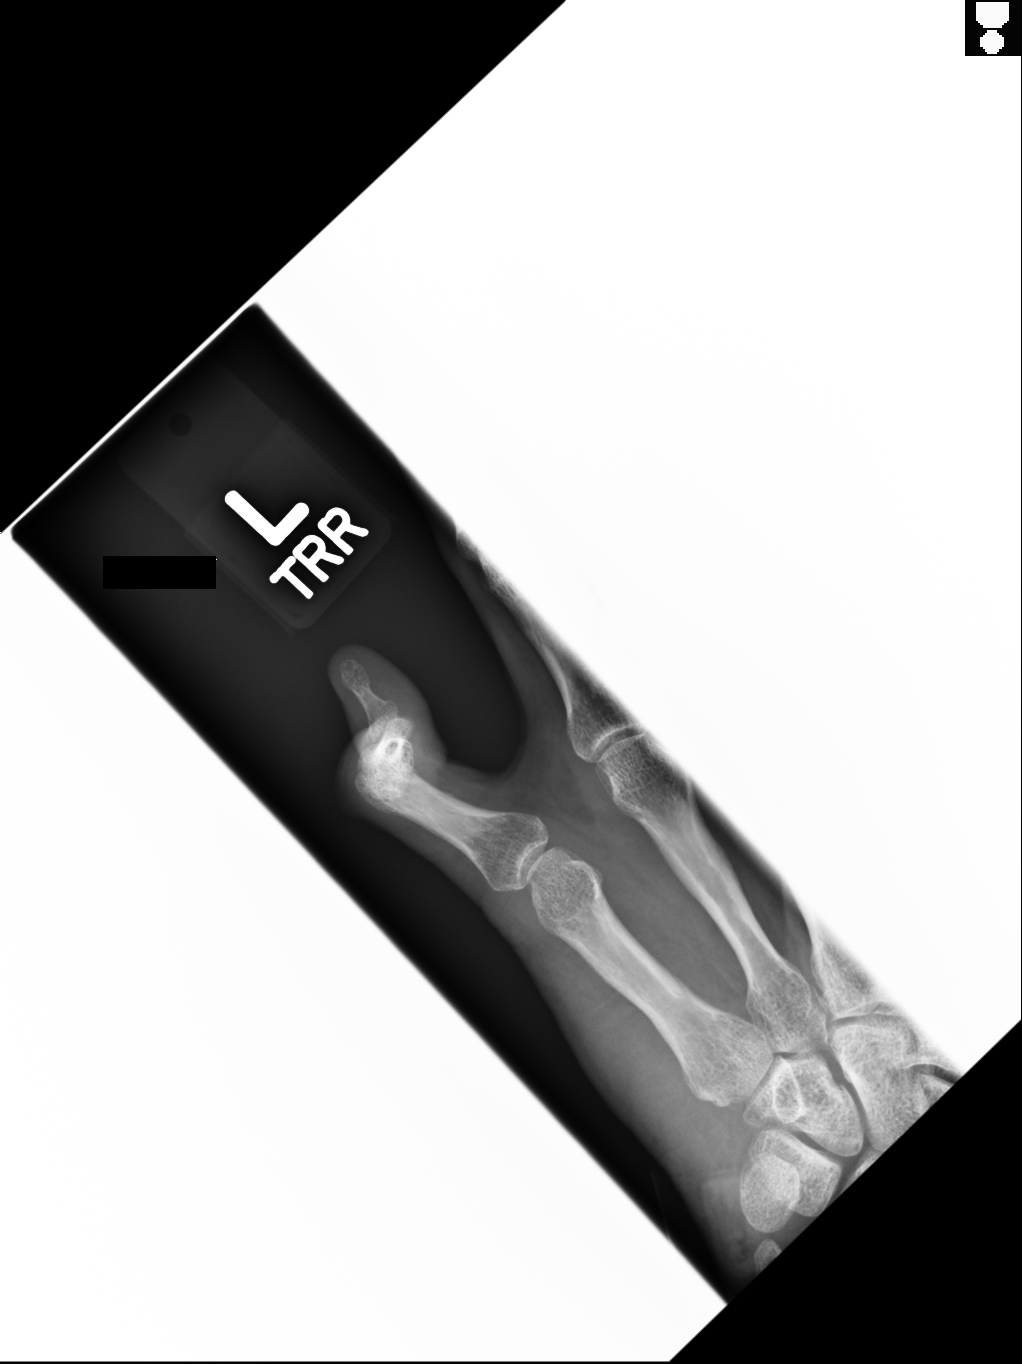
[im 2/3]
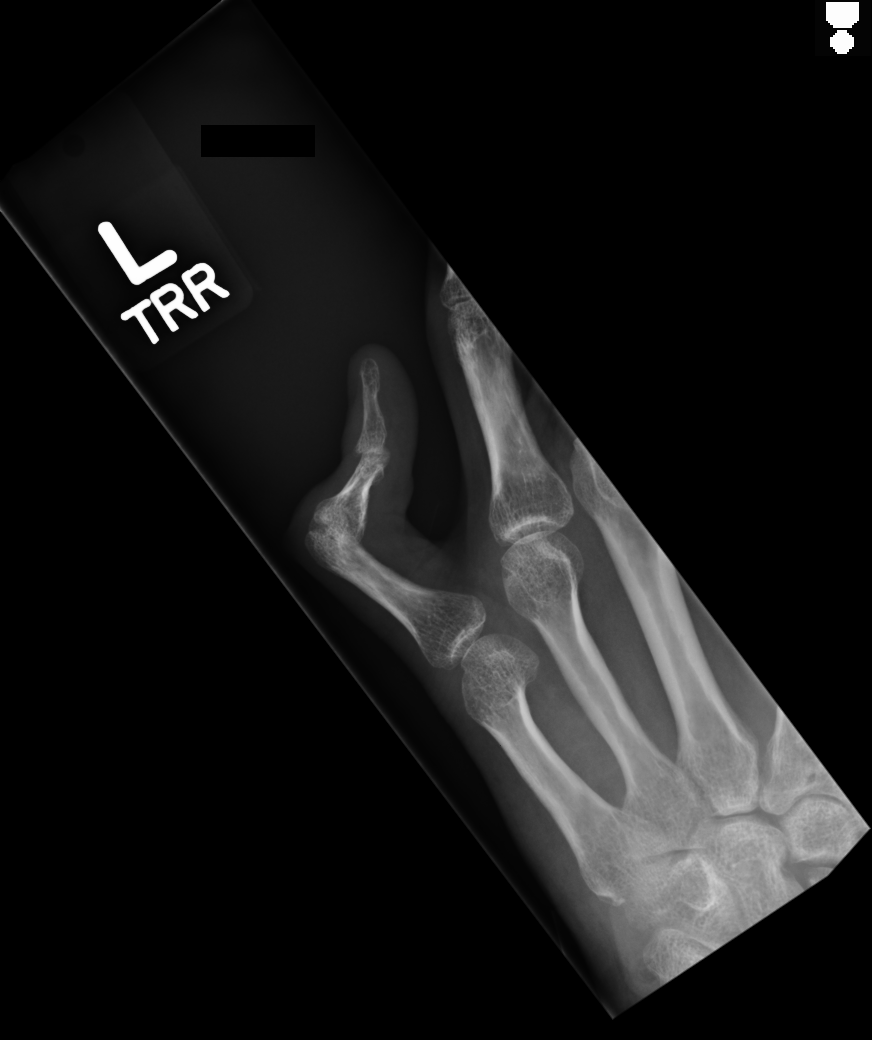
[im 3/3]
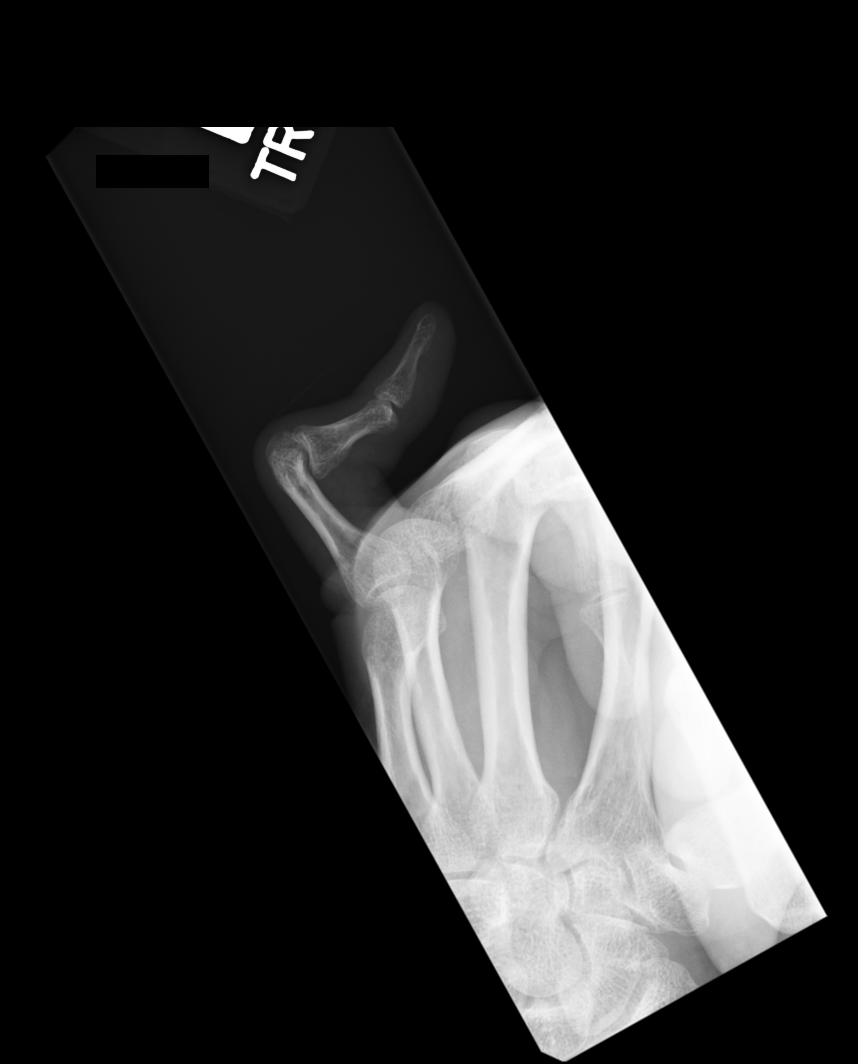

[3 of 3 positions shown; findings below may reference images not displayed]

FINDINGS: There is chronic deformity of the left fifth digit, with flexion and
volar subluxation at the fifth proximal interphalangeal joint, and
mild hyperextension at the fifth distal interphalangeal joint. This
appearance is grossly stable from multiple studies dating back to
3778. No acute fracture is seen.

No significant soft tissue abnormalities are characterized.
Underlying chronic extensor tendon sheath injury cannot be excluded.
IMPRESSION: Stable chronic deformity of the left fifth digit. This appearance is
unchanged from multiple studies dating back to 3778. No acute
fracture seen.

## 2016-11-03 ENCOUNTER — Emergency Department (HOSPITAL_COMMUNITY): Payer: Medicare Other

## 2016-11-03 ENCOUNTER — Emergency Department (HOSPITAL_COMMUNITY)
Admission: EM | Admit: 2016-11-03 | Discharge: 2016-11-03 | Disposition: A | Discharge status: Home / Self Care | Payer: Medicare Other | Attending: Emergency Medicine | Admitting: Emergency Medicine

## 2016-11-03 ENCOUNTER — Encounter (HOSPITAL_COMMUNITY): Payer: Self-pay | Admitting: Cardiology

## 2016-11-03 DIAGNOSIS — S6391XA Sprain of unspecified part of right wrist and hand, initial encounter: Secondary | ICD-10-CM

## 2016-11-03 DIAGNOSIS — Y9389 Activity, other specified: Secondary | ICD-10-CM | POA: Diagnosis not present

## 2016-11-03 DIAGNOSIS — J45909 Unspecified asthma, uncomplicated: Secondary | ICD-10-CM | POA: Insufficient documentation

## 2016-11-03 DIAGNOSIS — Y92009 Unspecified place in unspecified non-institutional (private) residence as the place of occurrence of the external cause: Secondary | ICD-10-CM | POA: Insufficient documentation

## 2016-11-03 DIAGNOSIS — F17210 Nicotine dependence, cigarettes, uncomplicated: Secondary | ICD-10-CM | POA: Insufficient documentation

## 2016-11-03 DIAGNOSIS — I10 Essential (primary) hypertension: Secondary | ICD-10-CM | POA: Insufficient documentation

## 2016-11-03 DIAGNOSIS — Y999 Unspecified external cause status: Secondary | ICD-10-CM | POA: Diagnosis not present

## 2016-11-03 DIAGNOSIS — Z791 Long term (current) use of non-steroidal anti-inflammatories (NSAID): Secondary | ICD-10-CM | POA: Diagnosis not present

## 2016-11-03 DIAGNOSIS — Z79899 Other long term (current) drug therapy: Secondary | ICD-10-CM | POA: Insufficient documentation

## 2016-11-03 DIAGNOSIS — W2201XA Walked into wall, initial encounter: Secondary | ICD-10-CM | POA: Diagnosis not present

## 2016-11-03 DIAGNOSIS — S6991XA Unspecified injury of right wrist, hand and finger(s), initial encounter: Secondary | ICD-10-CM | POA: Diagnosis present

## 2016-11-03 MED ORDER — IBUPROFEN 800 MG PO TABS
800.0000 mg | ORAL_TABLET | Freq: Once | ORAL | Status: AC
Start: 2016-11-03 — End: 2016-11-03
  Administered 2016-11-03: 800 mg via ORAL
  Filled 2016-11-03: qty 1

## 2016-11-03 MED ORDER — HYDROCODONE-ACETAMINOPHEN 5-325 MG PO TABS: ORAL_TABLET | ORAL | 0 refills | Status: DC

## 2016-11-03 MED ORDER — HYDROCODONE-ACETAMINOPHEN 5-325 MG PO TABS
1.0000 | ORAL_TABLET | Freq: Once | ORAL | Status: AC
  Administered 2016-11-03: 1 via ORAL
  Filled 2016-11-03: qty 1

## 2016-11-03 NOTE — ED Triage Notes (Signed)
Punched wall with right hand  Weeds ago.  C/o pain and swelling.

## 2016-11-03 NOTE — Discharge Instructions (Signed)
Elevate and apply ice packs on/off to your hand to help reduce the swelling.  Keep it splinted.  Call one of the providers listed to arrange a follow-up appt.

## 2016-11-03 NOTE — ED Provider Notes (Signed)
[facility]-EMERGENCY DEPT Provider Note   CSN: [id number] Arrival date & time: 11/03/16  1505     History   Chief Complaint Chief Complaint  Patient presents with  . Hand Injury    HPI [name] is a 40 y.o. male.  HPI   [name] is a 40 y.o. male who presents to the Emergency Department complaining of persistent hand pain and swelling for two weeks.  He states the symptoms began after punching a picture.  He reports pain with attempted movement of the fourth and fifth fingers.  He states initially he had a small cut at the base of the fifth finger that has since healed.  He denies numbness of the hand or fingers, redness, fever or chills.  Pain radiates to the wrist with movement, but does not radiate into the arm.    Past Medical History:  Diagnosis Date  . Anxiety   . Asthma   . Depression   . GERD (gastroesophageal reflux disease)   . Hypertension   . Hyponatremia 06/2016  . Seizures [facility])     Patient Active Problem List   Diagnosis Date Noted  . Musculoskeletal pain 06/28/2016  . Left flank pain   . Hyponatremia 06/27/2016  . 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 Pleural effusion, right 12/31/2015  . GERD (gastroesophageal reflux disease) 11/11/2015  . Borderline personality disorder 11/11/2015  . Alcohol use disorder, severe, in controlled environment (HCC) 11/11/2015  . 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 Tobacco abuse 11/05/2013  . HTN (hypertension) 11/05/2013    Past Surgical History:  Procedure Laterality Date  . FINGER SURGERY Left    5th digit  . PLEURAL EFFUSION DRAINAGE Right 01/02/2016   Procedure: DRAINAGE OF PLEURAL EFFUSION;  Surgeon: Loreli Slot, MD;  Location: [facility] OR;  Service: Thoracic;  Laterality: Right;  Marland Kitchen VIDEO ASSISTED THORACOSCOPY (VATS)/DECORTICATION Right 01/02/2016   Procedure: VIDEO  ASSISTED THORACOSCOPY (VATS)/DECORTICATION;  Surgeon: Loreli Slot, MD;  Location: [facility] OR;  Service: Thoracic;  Laterality: Right;  Marland Kitchen VIDEO BRONCHOSCOPY N/A 01/02/2016   Procedure: VIDEO BRONCHOSCOPY;  Surgeon: Loreli Slot, MD;  Location: [facility] OR;  Service: Thoracic;  Laterality: N/A;       Home Medications    Prior to Admission medications   Medication Sig Start Date End Date Taking? Authorizing Provider  albuterol (PROVENTIL HFA;VENTOLIN HFA) 108 (90 Base) MCG/ACT inhaler Inhale 2 puffs into the lungs every 4 (four) hours as needed for wheezing or shortness of breath.    Historical Provider, MD  carisoprodol (SOMA) 350 MG tablet Take 1 tablet (350 mg total) by mouth 3 (three) times daily as needed (back pain/ spasms). 06/28/16   Rodolph Bong, MD  chlorpheniramine-HYDROcodone [facility] PENNKINETIC ER) 10-8 MG/5ML SUER Take 5 mLs by mouth every 12 (twelve) hours as needed for cough. 07/06/16   Hope Orlene Och, NP  disulfiram (ANTABUSE) 250 MG tablet Take 250 mg by mouth daily.    Historical Provider, MD  divalproex (DEPAKOTE ER) 500 MG 24 hr tablet Take 4 tablets (2,000 mg total) by mouth at bedtime. 11/13/15   Jimmy Footman, MD  escitalopram (LEXAPRO) 20 MG tablet Take 20 mg by mouth every morning.    Historical Provider, MD  esomeprazole (NEXIUM) 40 MG capsule Take 40 mg by mouth daily.     Historical Provider, MD  ferrous sulfate 325 (65 FE) MG tablet Take  1 tablet (325 mg total) by mouth 2 (two) times daily with a meal. 01/11/16   Belkys A Regalado, MD  Fluticasone-Salmeterol (ADVAIR) 250-50 MCG/DOSE AEPB Inhale 1 puff into the lungs 2 (two) times daily.    Historical Provider, MD  gabapentin (NEURONTIN) 300 MG capsule Take 300 mg by mouth 3 (three) times daily.    Historical Provider, MD  hydrOXYzine (ATARAX/VISTARIL) 50 MG tablet Take 50 mg by mouth 3 (three) times daily as needed for anxiety.    Historical Provider, MD  ibuprofen (ADVIL,MOTRIN) 800 MG tablet Take  800 mg by mouth 3 (three) times daily as needed for moderate pain.    Historical Provider, MD  loperamide (IMODIUM) 2 MG capsule Take 4 mg by mouth daily as needed for diarrhea or loose stools.     Historical Provider, MD  Melatonin 5 MG TABS Take 10 mg by mouth at bedtime.    Historical Provider, MD  Multiple Vitamins-Minerals (THEREMS-M) TABS Take 1 tablet by mouth daily.    Historical Provider, MD  QUEtiapine (SEROQUEL) 200 MG tablet Take 200 mg by mouth at bedtime. For mood/sleep.    Historical Provider, MD    Family History Family History  Problem Relation Age of Onset  . CAD Mother   . Diabetes Mellitus II Father   . Prostate cancer Paternal Grandfather     Social History Social History  Substance Use Topics  . Smoking status: Current Every Day Smoker    Packs/day: 0.50    Years: 20.00    Types: Cigarettes  . Smokeless tobacco: Never Used  . Alcohol use No     Comment: pt states he no longer drinks     Allergies   Penicillins   Review of Systems Review of Systems  Constitutional: Negative for chills and fever.  Musculoskeletal: Positive for arthralgias and joint swelling.       Pain and swelling of the right hand  Skin: Negative for color change.  Neurological: Negative for weakness and numbness.  All other systems reviewed and are negative.    Physical Exam Updated Vital Signs BP 156/90   Pulse 87   Temp 98.1 F (36.7 C) (Oral)   Resp 16   Ht 5\' 11"  (1.803 m)   Wt 97.5 kg   SpO2 98%   BMI 29.99 kg/m   Physical Exam  Constitutional: He is oriented to person, place, and time. He appears well-developed and well-nourished. No distress.  HENT:  Head: Normocephalic and atraumatic.  Cardiovascular: Normal rate and regular rhythm.   Pulmonary/Chest: Effort normal and breath sounds normal.  Musculoskeletal: He exhibits edema and tenderness.  Diffuse ttp of the dorsal right hand, moderate edema noted.  Pain with attempted extension for the fourth and fifth  fingers.  Partial flexion contractures of the fourth and fifth fingers that pt states are chronic.  Radial pulse is brisk, distal sensation intact.  CR< 2 sec.  No bruising, erythema or bony deformity.  Healed laceration at the proximal fifth finger.  No surrounding erythema.   Compartments soft.  Neurological: He is alert and oriented to person, place, and time. He exhibits normal muscle tone. Coordination normal.  Skin: Skin is warm and dry.  Nursing note and vitals reviewed.    ED Treatments / Results  Labs (all labs ordered are listed, but only abnormal results are displayed) Labs Reviewed - No data to display  EKG  EKG Interpretation None       Radiology Dg Hand Complete Right  Result  Date: 11/03/2016 CLINICAL DATA:  The patient punched a wall 2-3 weeks ago with onset of fourth and fifth MCP joint pain. Initial encounter. EXAM: RIGHT HAND - COMPLETE 3+ VIEW COMPARISON:  None. FINDINGS: Soft tissues appear diffusely swollen. No acute fracture is identified. There are remote healed fractures of the necks of the fourth and fifth metacarpals. There is also likely remote healed fracture of the proximal fifth metacarpal. All of the fingers are in flexion. The patient has a markedly advanced for age [facility] with bone-on-bone joint space narrowing, subchondral sclerosis and osteophytosis. Soft tissues are unremarkable. IMPRESSION: Soft tissue swelling about the hand.  Negative for acute fracture. Remote fourth and fifth metacarpal fractures as above. Severe, advanced for age first [facility] oste[facility]. Electronically Signed   By: Drusilla Kannerhomas  Dalessio M.D.   On: 11/03/2016 15:56    Procedures Procedures (including critical care time)  Medications Ordered in ED Medications  HYDROcodone-acetaminophen (NORCO/VICODIN) 5-325 MG per tablet 1 tablet (not administered)  ibuprofen (ADVIL,MOTRIN) tablet 800 mg (not administered)     Initial Impression / Assessment and Plan / ED  Course  I have reviewed the triage vital signs and the nursing notes.  Pertinent labs & imaging results that were available during my care of the patient were reviewed by me and considered in my medical decision making (see chart for details).  Clinical Course    Pt with diffuse edema of the dorsal hand without evidence of infection and XR neg for new fx's.  I have discussed the possibility of occult fx or tendon injury given the degree of swelling to the hand.    Velcro splint was applied, ice packs given.  Pt agrees to RICE therapy and close orthopedic f/u   Pt reviewed on the [facility], no recent narcotics on file.   Final Clinical Impressions(s) / ED Diagnoses   Final diagnoses:  Sprain of right hand, initial encounter    New Prescriptions New Prescriptions   No medications on file     Rosey Bathammy Lira Stephen, PA-C 11/03/16 1653    Linwood DibblesJon Knapp, MD 11/03/16 2051

## 2016-11-03 NOTE — ED Notes (Signed)
Pt c/o living in a group home with "horrible living conditions".  States that the residents do not bathe or shower.  States that he gives them his dirty clothes and they do not wash his clothes.  Also states that they are not giving him enough food.  States he is getting child portions and he has had a 15 lb weight loss in the last month.  Called and notified Hoyle BarrJasmine Henderson with DSS in [place].

## 2016-11-19 ENCOUNTER — Encounter (HOSPITAL_COMMUNITY): Payer: Self-pay | Admitting: *Deleted

## 2016-11-19 ENCOUNTER — Emergency Department (HOSPITAL_COMMUNITY): Payer: Medicare Other

## 2016-11-19 ENCOUNTER — Emergency Department (HOSPITAL_COMMUNITY)
Admission: EM | Admit: 2016-11-19 | Discharge: 2016-11-19 | Disposition: A | Discharge status: Home / Self Care | Payer: Medicare Other | Attending: Emergency Medicine | Admitting: Emergency Medicine

## 2016-11-19 DIAGNOSIS — Z79899 Other long term (current) drug therapy: Secondary | ICD-10-CM | POA: Diagnosis not present

## 2016-11-19 DIAGNOSIS — J189 Pneumonia, unspecified organism: Secondary | ICD-10-CM | POA: Diagnosis not present

## 2016-11-19 DIAGNOSIS — J45909 Unspecified asthma, uncomplicated: Secondary | ICD-10-CM | POA: Insufficient documentation

## 2016-11-19 DIAGNOSIS — Z791 Long term (current) use of non-steroidal anti-inflammatories (NSAID): Secondary | ICD-10-CM | POA: Diagnosis not present

## 2016-11-19 DIAGNOSIS — I10 Essential (primary) hypertension: Secondary | ICD-10-CM | POA: Diagnosis not present

## 2016-11-19 DIAGNOSIS — F17210 Nicotine dependence, cigarettes, uncomplicated: Secondary | ICD-10-CM | POA: Insufficient documentation

## 2016-11-19 DIAGNOSIS — R0602 Shortness of breath: Secondary | ICD-10-CM | POA: Diagnosis present

## 2016-11-19 LAB — CBC WITH DIFFERENTIAL/PLATELET
Basophils Absolute: 0 10*3/uL (ref 0.0–0.1)
Basophils Relative: 0 %
EOS ABS: 0.8 10*3/uL — AB (ref 0.0–0.7)
EOS PCT: 11 %
HCT: 40.4 % (ref 39.0–52.0)
Hemoglobin: 14.2 g/dL (ref 13.0–17.0)
LYMPHS ABS: 1.2 10*3/uL (ref 0.7–4.0)
LYMPHS PCT: 17 %
MCH: 32 pg (ref 26.0–34.0)
MCHC: 35.1 g/dL (ref 30.0–36.0)
MCV: 91 fL (ref 78.0–100.0)
MONO ABS: 0.5 10*3/uL (ref 0.1–1.0)
Monocytes Relative: 7 %
Neutro Abs: 4.8 10*3/uL (ref 1.7–7.7)
Neutrophils Relative %: 65 %
PLATELETS: 187 10*3/uL (ref 150–400)
RBC: 4.44 MIL/uL (ref 4.22–5.81)
RDW: 13.2 % (ref 11.5–15.5)
WBC: 7.4 10*3/uL (ref 4.0–10.5)

## 2016-11-19 LAB — COMPREHENSIVE METABOLIC PANEL
ALT: 21 U/L (ref 17–63)
ANION GAP: 9 (ref 5–15)
AST: 29 U/L (ref 15–41)
Albumin: 3.9 g/dL (ref 3.5–5.0)
Alkaline Phosphatase: 65 U/L (ref 38–126)
BUN: 7 mg/dL (ref 6–20)
CHLORIDE: 97 mmol/L — AB (ref 101–111)
CO2: 25 mmol/L (ref 22–32)
CREATININE: 0.87 mg/dL (ref 0.61–1.24)
Calcium: 8.9 mg/dL (ref 8.9–10.3)
Glucose, Bld: 95 mg/dL (ref 65–99)
POTASSIUM: 3.8 mmol/L (ref 3.5–5.1)
SODIUM: 131 mmol/L — AB (ref 135–145)
Total Bilirubin: 0.5 mg/dL (ref 0.3–1.2)
Total Protein: 7.1 g/dL (ref 6.5–8.1)

## 2016-11-19 LAB — I-STAT TROPONIN, ED: TROPONIN I, POC: 0 ng/mL (ref 0.00–0.08)

## 2016-11-19 MED ORDER — AZITHROMYCIN 250 MG PO TABS: 250.0000 mg | ORAL_TABLET | Freq: Every day | ORAL | 0 refills | Status: DC

## 2016-11-19 MED ORDER — AZITHROMYCIN 250 MG PO TABS
500.0000 mg | ORAL_TABLET | Freq: Once | ORAL | Status: AC
  Administered 2016-11-19: 500 mg via ORAL
  Filled 2016-11-19: qty 2

## 2016-11-19 MED ORDER — PREDNISONE 50 MG PO TABS
60.0000 mg | ORAL_TABLET | Freq: Once | ORAL | Status: AC
  Administered 2016-11-19: 60 mg via ORAL
  Filled 2016-11-19: qty 1

## 2016-11-19 MED ORDER — ALBUTEROL SULFATE (2.5 MG/3ML) 0.083% IN NEBU
2.5000 mg | INHALATION_SOLUTION | Freq: Once | RESPIRATORY_TRACT | Status: AC
  Administered 2016-11-19: 2.5 mg via RESPIRATORY_TRACT
  Filled 2016-11-19: qty 3

## 2016-11-19 MED ORDER — HYDROCODONE-ACETAMINOPHEN 5-325 MG PO TABS
1.0000 | ORAL_TABLET | Freq: Once | ORAL | Status: AC
  Administered 2016-11-19: 1 via ORAL
  Filled 2016-11-19: qty 1

## 2016-11-19 MED ORDER — HYDROCODONE-ACETAMINOPHEN 5-325 MG PO TABS: 1.0000 | ORAL_TABLET | Freq: Four times a day (QID) | ORAL | 0 refills | Status: DC | PRN

## 2016-11-19 MED ORDER — PREDNISONE 20 MG PO TABS: ORAL_TABLET | ORAL | 0 refills | Status: DC

## 2016-11-19 MED ORDER — IPRATROPIUM-ALBUTEROL 0.5-2.5 (3) MG/3ML IN SOLN
3.0000 mL | Freq: Once | RESPIRATORY_TRACT | Status: AC
  Administered 2016-11-19: 3 mL via RESPIRATORY_TRACT
  Filled 2016-11-19: qty 3

## 2016-11-19 NOTE — ED Triage Notes (Signed)
Pt is here by EMS from [facility].  He has had URI for about 3 weeks.  Pt has been having a cough with very little greenish/yellow sputum and left sided chest pain which increases when he coughs and moves.  No fever with this.  Hx of chest tube due to severe pneumonia.

## 2016-11-19 NOTE — ED Provider Notes (Signed)
[facility]-EMERGENCY DEPT Provider Note   CSN: [id number] Arrival date & time: 11/19/16  1412     History   Chief Complaint Chief Complaint  Patient presents with  . URI    HPI [name] is a 40 y.o. male.  Patient has had cough and shortness of breath for a few days   The history is provided by the patient. No language interpreter was used.  URI   This is a new problem. The current episode started 2 days ago. The problem has not changed since onset.There has been no fever. Associated symptoms include cough and wheezing. Pertinent negatives include no chest pain, no abdominal pain, no diarrhea, no congestion, no headaches and no rash. He has tried an inhaler for the symptoms. The treatment provided mild relief.    Past Medical History:  Diagnosis Date  . Anxiety   . Asthma   . Depression   . GERD (gastroesophageal reflux disease)   . Hypertension   . Hyponatremia 06/2016  . Seizures [facility](HCC)     Patient Active Problem List   Diagnosis Date Noted  . Musculoskeletal pain 06/28/2016  . Left flank pain   . Hyponatremia 06/27/2016  . 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 Pleural effusion, right 12/31/2015  . GERD (gastroesophageal reflux disease) 11/11/2015  . Borderline personality disorder 11/11/2015  . Alcohol use disorder, severe, in controlled environment (HCC) 11/11/2015  . 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 Tobacco abuse 11/05/2013  . HTN (hypertension) 11/05/2013    Past Surgical History:  Procedure Laterality Date  . FINGER SURGERY Left    5th digit  . PLEURAL EFFUSION DRAINAGE Right 01/02/2016   Procedure: DRAINAGE OF PLEURAL EFFUSION;  Surgeon: Loreli SlotSteven C Hendrickson, MD;  [facility];  Service: Thoracic;  Laterality: Right;  Marland Kitchen. VIDEO ASSISTED THORACOSCOPY (VATS)/DECORTICATION Right 01/02/2016   Procedure: VIDEO ASSISTED  THORACOSCOPY (VATS)/DECORTICATION;  Surgeon: Loreli SlotSteven C Hendrickson, MD;  [facility];  Service: Thoracic;  Laterality: Right;  Marland Kitchen. VIDEO BRONCHOSCOPY N/A 01/02/2016   Procedure: VIDEO BRONCHOSCOPY;  Surgeon: Loreli SlotSteven C Hendrickson, MD;  [facility];  Service: Thoracic;  Laterality: N/A;       Home Medications    Prior to Admission medications   Medication Sig Start Date End Date Taking? Authorizing Provider  albuterol (PROVENTIL HFA;VENTOLIN HFA) 108 (90 Base) MCG/ACT inhaler Inhale 2 puffs into the lungs every 4 (four) hours as needed for wheezing or shortness of breath.   Yes Historical Provider, MD  divalproex (DEPAKOTE ER) 500 MG 24 hr tablet Take 4 tablets (2,000 mg total) by mouth at bedtime. 11/13/15  Yes Jimmy FootmanAndrea Hernandez-Gonzalez, MD  escitalopram (LEXAPRO) 20 MG tablet Take 20 mg by mouth every morning.   Yes Historical Provider, MD  esomeprazole (NEXIUM) 40 MG capsule Take 40 mg by mouth daily.    Yes Historical Provider, MD  ferrous sulfate 325 (65 FE) MG tablet Take 1 tablet (325 mg total) by mouth 2 (two) times daily with a meal. 01/11/16  Yes Belkys A Regalado, MD  Fluticasone-Salmeterol (ADVAIR) 250-50 MCG/DOSE AEPB Inhale 1 puff into the lungs 2 (two) times daily.   Yes Historical Provider, MD  gabapentin (NEURONTIN) 300 MG capsule Take 300 mg by mouth 3 (three) times daily.   Yes Historical Provider, MD  hydrOXYzine (ATARAX/VISTARIL) 50 MG tablet Take 50 mg by mouth 3 (three) times daily as needed for anxiety.  Yes Historical Provider, MD  ibuprofen (ADVIL,MOTRIN) 800 MG tablet Take 800 mg by mouth 3 (three) times daily as needed for moderate pain.   Yes Historical Provider, MD  loperamide (IMODIUM) 2 MG capsule Take 4 mg by mouth daily as needed for diarrhea or loose stools.    Yes Historical Provider, MD  Melatonin 5 MG TABS Take 10 mg by mouth at bedtime.   Yes Historical Provider, MD  Multiple Vitamins-Minerals (THEREMS-M) TABS Take 1 tablet by mouth daily.   Yes  Historical Provider, MD  QUEtiapine (SEROQUEL) 200 MG tablet Take 200 mg by mouth at bedtime. For mood/sleep.   Yes Historical Provider, MD  azithromycin (ZITHROMAX) 250 MG tablet Take 1 tablet (250 mg total) by mouth daily. Take first 2 tablets together, then 1 every day until finished. 11/19/16   Bethann BerkshireJoseph Louis Gaw, MD  HYDROcodone-acetaminophen (NORCO/VICODIN) 5-325 MG tablet Take 1 tablet by mouth every 6 (six) hours as needed for moderate pain. 11/19/16   Bethann BerkshireJoseph Shawnda Mauney, MD  predniSONE (DELTASONE) 20 MG tablet 2 tabs po daily x 3 days 11/19/16   Bethann BerkshireJoseph Ellarae Nevitt, MD    Family History Family History  Problem Relation Age of Onset  . CAD Mother   . Diabetes Mellitus II Father   . Prostate cancer Paternal Grandfather     Social History Social History  Substance Use Topics  . Smoking status: Current Every Day Smoker    Packs/day: 0.50    Years: 20.00    Types: Cigarettes  . Smokeless tobacco: Never Used  . Alcohol use No     Comment: pt states he no longer drinks     Allergies   Penicillins   Review of Systems Review of Systems  Constitutional: Negative for appetite change and fatigue.  HENT: Negative for congestion, ear discharge and sinus pressure.   Eyes: Negative for discharge.  Respiratory: Positive for cough and wheezing.   Cardiovascular: Negative for chest pain.  Gastrointestinal: Negative for abdominal pain and diarrhea.  Genitourinary: Negative for frequency and hematuria.  Musculoskeletal: Negative for back pain.  Skin: Negative for rash.  Neurological: Negative for seizures and headaches.  Psychiatric/Behavioral: Negative for hallucinations.     Physical Exam Updated Vital Signs BP 155/81 (BP Location: Left Arm)   Pulse 91   Temp 98.4 F (36.9 C) (Oral)   Resp 16   Wt 215 lb (97.5 kg)   SpO2 99%   BMI 29.99 kg/m   Physical Exam  Constitutional: He is oriented to person, place, and time. He appears well-developed.  HENT:  Head: Normocephalic.  Eyes:  Conjunctivae and EOM are normal. No scleral icterus.  Neck: Neck supple. No thyromegaly present.  Cardiovascular: Normal rate and regular rhythm.  Exam reveals no gallop and no friction rub.   No murmur heard. Pulmonary/Chest: No stridor. He has wheezes. He has no rales. He exhibits no tenderness.  Abdominal: He exhibits no distension. There is no tenderness. There is no rebound.  Musculoskeletal: Normal range of motion. He exhibits no edema.  Lymphadenopathy:    He has no cervical adenopathy.  Neurological: He is oriented to person, place, and time. He exhibits normal muscle tone. Coordination normal.  Skin: No rash noted. No erythema.  Psychiatric: He has a normal mood and affect. His behavior is normal.     ED Treatments / Results  Labs (all labs ordered are listed, but only abnormal results are displayed) Labs Reviewed  CBC WITH DIFFERENTIAL/PLATELET - Abnormal; Notable for the following:  Result Value   Eosinophils Absolute 0.8 (*)    All other components within normal limits  COMPREHENSIVE METABOLIC PANEL - Abnormal; Notable for the following:    Sodium 131 (*)    Chloride 97 (*)    All other components within normal limits  I-STAT TROPOININ, ED    EKG  EKG Interpretation  Date/Time:  Saturday November 19 2016 14:32:12 EST Ventricular Rate:  81 PR Interval:  102 QRS Duration: 84 QT Interval:  364 QTC Calculation: 422 R Axis:   70 Text Interpretation:  Sinus rhythm with short PR Nonspecific T wave abnormality Abnormal ECG No STEMI.  Confirmed by Kaloni Bisaillon  MD, Jomarie Longs [phone]) on 11/19/2016 3:51:30 PM       Radiology Dg Chest 2 View  Result Date: 11/19/2016 CLINICAL DATA:  Pt is here by EMS from [facility]. He has had URI for about 3 weeks. Pt has been having a cough with very little greenish/yellow sputum and left sided chest pain which increases when he coughs and moves. EXAM: CHEST  2 VIEW COMPARISON:  None. FINDINGS: Heart margins are obscured  by left basilar opacity. Left lower lobe infiltrate obscures the left hemidiaphragm. There are small bilateral pleural effusions, left greater than right. Interstitial markings are prominent and there are Kerley B markings consistent with interstitial edema. IMPRESSION: 1. Interstitial edema. 2. Left lower lobe infiltrate and bilateral pleural effusions. Electronically Signed   By: Norva Pavlov M.D.   On: 11/19/2016 15:24    Procedures Procedures (including critical care time)  Medications Ordered in ED Medications  predniSONE (DELTASONE) tablet 60 mg (not administered)  HYDROcodone-acetaminophen (NORCO/VICODIN) 5-325 MG per tablet 1 tablet (not administered)  azithromycin (ZITHROMAX) tablet 500 mg (not administered)  ipratropium-albuterol (DUONEB) 0.5-2.5 (3) MG/3ML nebulizer solution 3 mL (3 mLs Nebulization Given 11/19/16 1618)  albuterol (PROVENTIL) (2.5 MG/3ML) 0.083% nebulizer solution 2.5 mg (2.5 mg Nebulization Given 11/19/16 1618)  HYDROcodone-acetaminophen (NORCO/VICODIN) 5-325 MG per tablet 1 tablet (1 tablet Oral Given 11/19/16 1624)     Initial Impression / Assessment and Plan / ED Course  I have reviewed the triage vital signs and the nursing notes.  Pertinent labs & imaging results that were available during my care of the patient were reviewed by me and considered in my medical decision making (see chart for details).  Clinical Course     Patient with pneumonia and bronchospasm. Patient is placed on prednisone and Z-Pak and will continue to use his inhaler every 4-6 hours. He will follow-up with his doctor this week  Final Clinical Impressions(s) / ED Diagnoses   Final diagnoses:  Community acquired pneumonia, unspecified laterality    New Prescriptions New Prescriptions   AZITHROMYCIN (ZITHROMAX) 250 MG TABLET    Take 1 tablet (250 mg total) by mouth daily. Take first 2 tablets together, then 1 every day until finished.   HYDROCODONE-ACETAMINOPHEN  (NORCO/VICODIN) 5-325 MG TABLET    Take 1 tablet by mouth every 6 (six) hours as needed for moderate pain.   PREDNISONE (DELTASONE) 20 MG TABLET    2 tabs po daily x 3 days     Bethann Berkshire, MD 11/19/16 1725

## 2016-11-19 NOTE — Discharge Instructions (Signed)
Follow-up with your doctor next week for recheck. Use your inhaler every 4-6 hours for wheezing and shortness of breath

## 2016-12-29 IMAGING — US US ABDOMEN COMPLETE
1 series · 14 of 25 positions shown · non-contrast
Comparison: CT abdomen and pelvis 03/06/2016

CLINICAL DATA: Right upper quadrant pain for 2 months. Nausea and
vomiting. Hypertension.

EXAM:
ABDOMEN ULTRASOUND COMPLETE

[Series 1: us abdomen complete · 0.20mm/px · 14 of 95 slices shown]
[im 1/95]
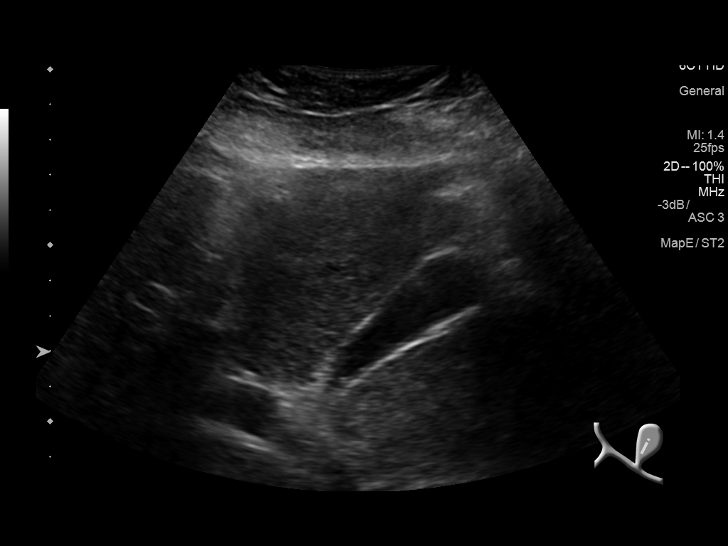
[im 8/95]
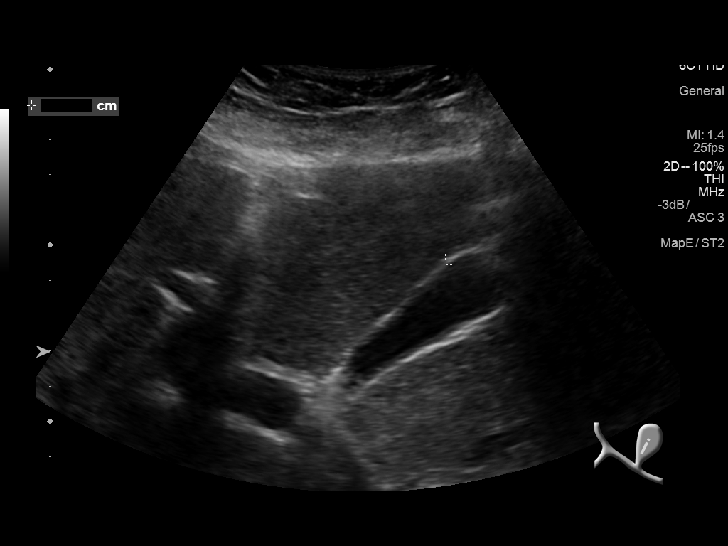
[im 16/95]
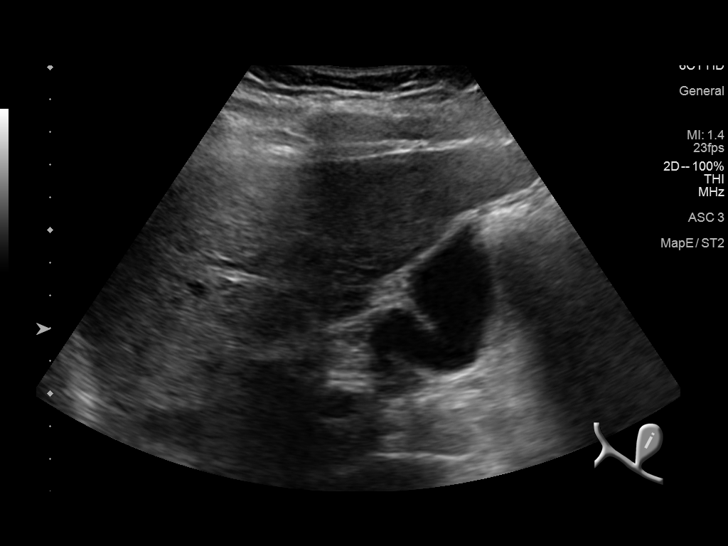
[im 24/95]
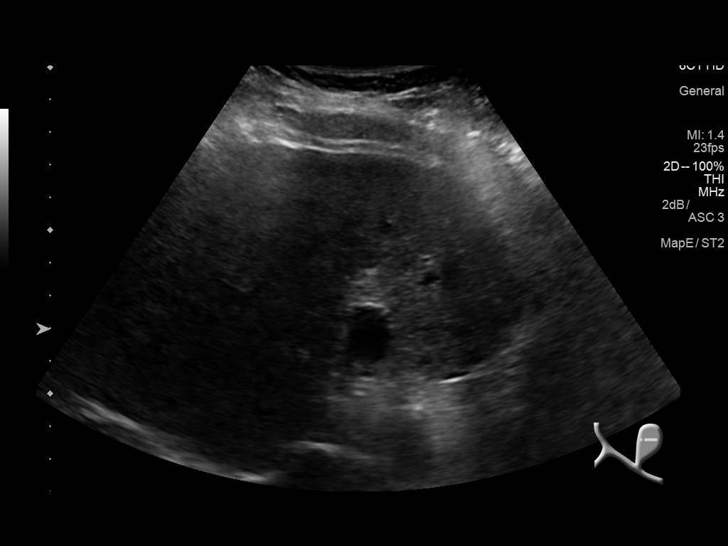
[im 32/95]
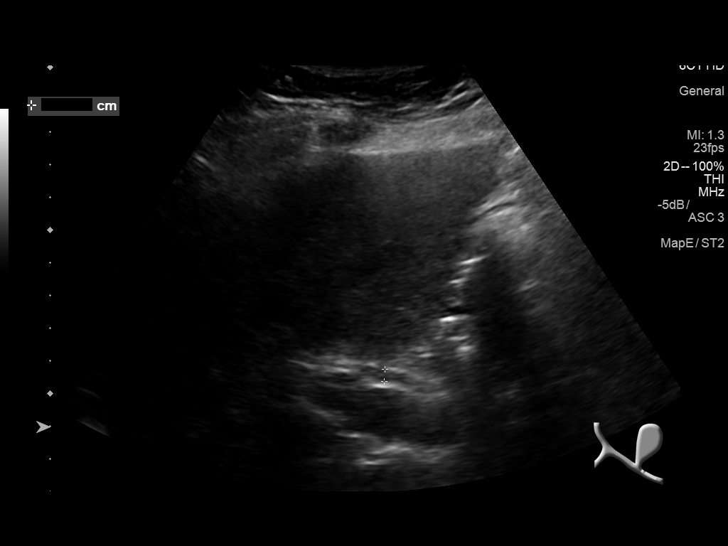
[im 36/95]
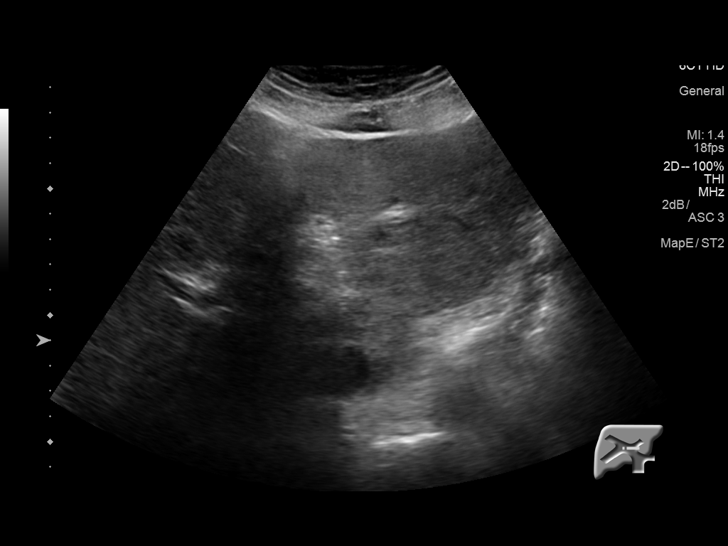
[im 44/95]
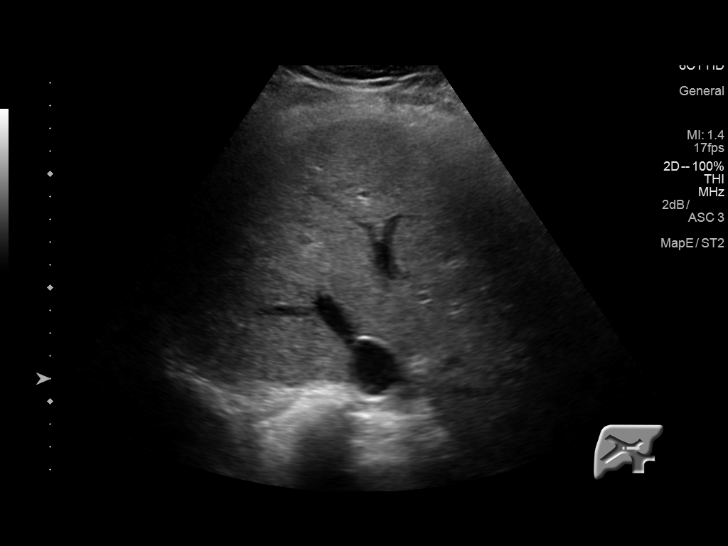
[im 51/95]
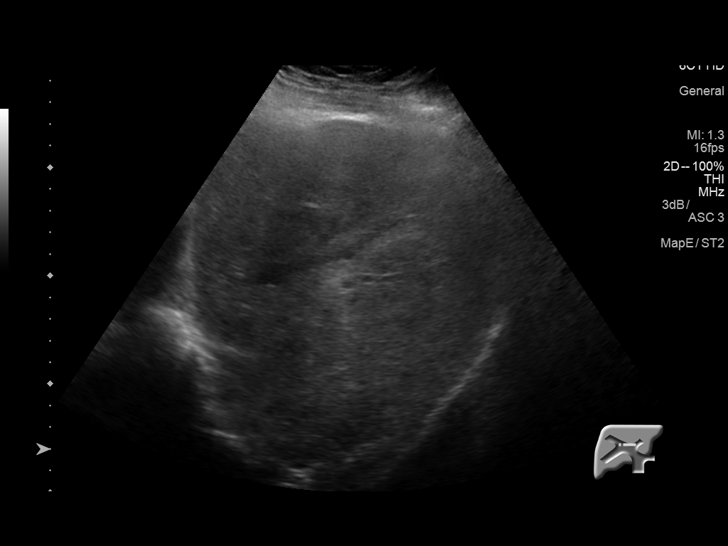
[im 59/95]
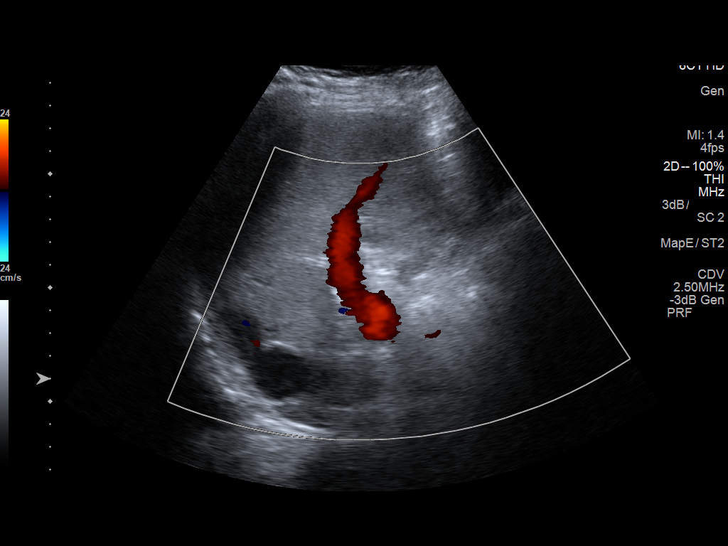
[im 63/95]
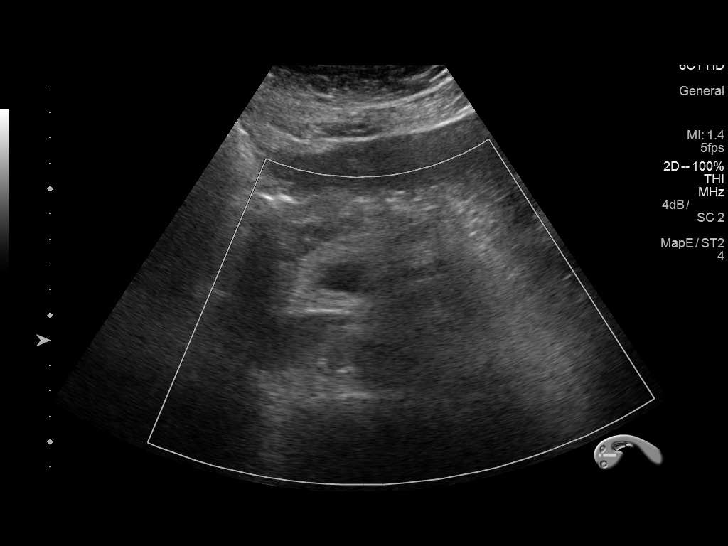
[im 71/95]
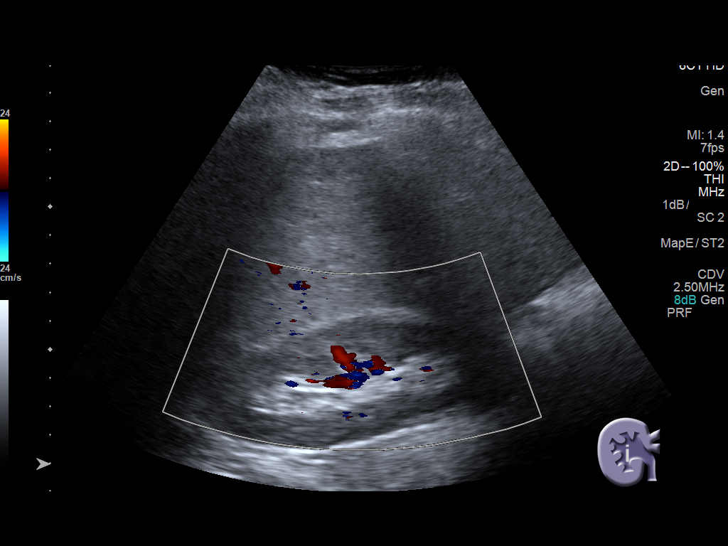
[im 79/95]
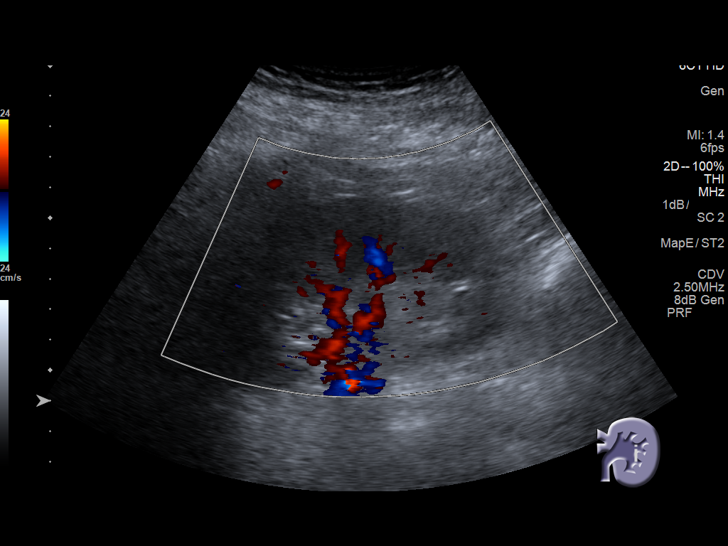
[im 87/95]
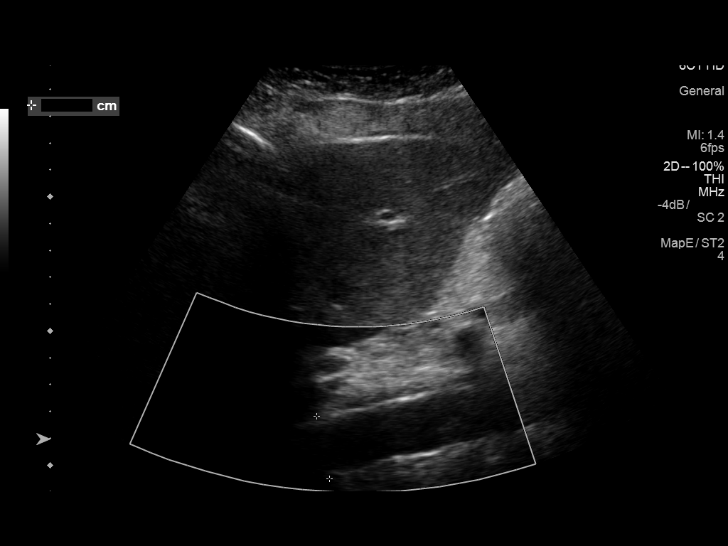
[im 95/95]
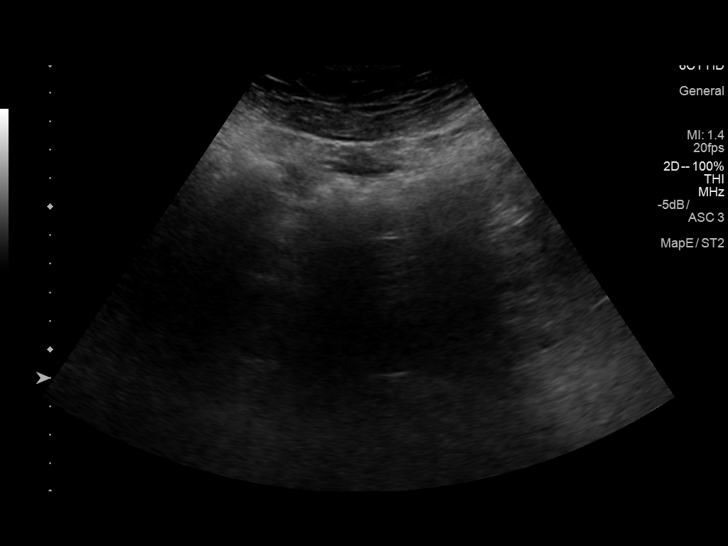

[14 of 25 positions shown; findings below may reference images not displayed]

FINDINGS: Gallbladder: No gallstones or wall thickening visualized. No
sonographic Murphy sign noted by sonographer.

Common bile duct: Diameter: 3.7 mm, normal

Liver: No focal lesion identified. Within normal limits in
parenchymal echogenicity.

IVC: No abnormality visualized.

Pancreas: Visualized portion unremarkable.

Spleen: Size and appearance within normal limits.

Right Kidney: Length: 11.3 cm. Echogenicity within normal limits. No
mass or hydronephrosis visualized.

Left Kidney: Length: 10.8 cm. Echogenicity within normal limits. No
mass or hydronephrosis visualized.

Abdominal aorta: No aneurysm visualized.

Other findings: None.
IMPRESSION: Normal examination.

## 2017-04-03 ENCOUNTER — Inpatient Hospital Stay (HOSPITAL_COMMUNITY)
Admission: EM | Admit: 2017-04-03 | Discharge: 2017-04-06 | DRG: 190 | Disposition: A | Discharge status: Home / Self Care | Payer: Medicare Other | Attending: Internal Medicine | Admitting: Internal Medicine

## 2017-04-03 ENCOUNTER — Encounter (HOSPITAL_COMMUNITY): Payer: Self-pay | Admitting: *Deleted

## 2017-04-03 ENCOUNTER — Emergency Department (HOSPITAL_COMMUNITY): Payer: Medicare Other

## 2017-04-03 DIAGNOSIS — F332 Major depressive disorder, recurrent severe without psychotic features: Secondary | ICD-10-CM | POA: Diagnosis not present

## 2017-04-03 DIAGNOSIS — F1011 Alcohol abuse, in remission: Secondary | ICD-10-CM | POA: Diagnosis present

## 2017-04-03 DIAGNOSIS — J441 Chronic obstructive pulmonary disease with (acute) exacerbation: Principal | ICD-10-CM | POA: Diagnosis present

## 2017-04-03 DIAGNOSIS — J9621 Acute and chronic respiratory failure with hypoxia: Secondary | ICD-10-CM | POA: Diagnosis present

## 2017-04-03 DIAGNOSIS — Z880 Allergy status to penicillin: Secondary | ICD-10-CM

## 2017-04-03 DIAGNOSIS — F319 Bipolar disorder, unspecified: Secondary | ICD-10-CM | POA: Diagnosis present

## 2017-04-03 DIAGNOSIS — K219 Gastro-esophageal reflux disease without esophagitis: Secondary | ICD-10-CM | POA: Diagnosis present

## 2017-04-03 DIAGNOSIS — Z79899 Other long term (current) drug therapy: Secondary | ICD-10-CM

## 2017-04-03 DIAGNOSIS — R0602 Shortness of breath: Secondary | ICD-10-CM

## 2017-04-03 DIAGNOSIS — Z720 Tobacco use: Secondary | ICD-10-CM | POA: Diagnosis not present

## 2017-04-03 DIAGNOSIS — I10 Essential (primary) hypertension: Secondary | ICD-10-CM | POA: Diagnosis present

## 2017-04-03 DIAGNOSIS — F445 Conversion disorder with seizures or convulsions: Secondary | ICD-10-CM | POA: Diagnosis present

## 2017-04-03 DIAGNOSIS — G40909 Epilepsy, unspecified, not intractable, without status epilepticus: Secondary | ICD-10-CM

## 2017-04-03 DIAGNOSIS — F17210 Nicotine dependence, cigarettes, uncomplicated: Secondary | ICD-10-CM | POA: Diagnosis present

## 2017-04-03 LAB — CBC WITH DIFFERENTIAL/PLATELET
BASOS ABS: 0 10*3/uL (ref 0.0–0.1)
Basophils Relative: 0 %
EOS PCT: 3 %
Eosinophils Absolute: 0.1 10*3/uL (ref 0.0–0.7)
HCT: 38.1 % — ABNORMAL LOW (ref 39.0–52.0)
Hemoglobin: 13.3 g/dL (ref 13.0–17.0)
LYMPHS PCT: 6 %
Lymphs Abs: 0.3 10*3/uL — ABNORMAL LOW (ref 0.7–4.0)
MCH: 32.1 pg (ref 26.0–34.0)
MCHC: 34.9 g/dL (ref 30.0–36.0)
MCV: 92 fL (ref 78.0–100.0)
MONO ABS: 0.6 10*3/uL (ref 0.1–1.0)
Monocytes Relative: 15 %
Neutro Abs: 3.1 10*3/uL (ref 1.7–7.7)
Neutrophils Relative %: 76 %
PLATELETS: 115 10*3/uL — AB (ref 150–400)
RBC: 4.14 MIL/uL — ABNORMAL LOW (ref 4.22–5.81)
RDW: 14 % (ref 11.5–15.5)
WBC: 4 10*3/uL (ref 4.0–10.5)

## 2017-04-03 LAB — BASIC METABOLIC PANEL
ANION GAP: 7 (ref 5–15)
BUN: 5 mg/dL — ABNORMAL LOW (ref 6–20)
CALCIUM: 8.3 mg/dL — AB (ref 8.9–10.3)
CO2: 31 mmol/L (ref 22–32)
CREATININE: 0.6 mg/dL — AB (ref 0.61–1.24)
Chloride: 98 mmol/L — ABNORMAL LOW (ref 101–111)
Glucose, Bld: 144 mg/dL — ABNORMAL HIGH (ref 65–99)
Potassium: 3.5 mmol/L (ref 3.5–5.1)
Sodium: 136 mmol/L (ref 135–145)

## 2017-04-03 LAB — TROPONIN I: Troponin I: <0.03 ng/mL (ref <0.03)

## 2017-04-03 LAB — INFLUENZA PANEL BY PCR (TYPE A & B)
Influenza A By PCR: NEGATIVE
Influenza B By PCR: NEGATIVE

## 2017-04-03 LAB — PHOSPHORUS: Phosphorus: 2.9 mg/dL (ref 2.5–4.6)

## 2017-04-03 LAB — MAGNESIUM: MAGNESIUM: 1.5 mg/dL — AB (ref 1.7–2.4)

## 2017-04-03 MED ORDER — DIVALPROEX SODIUM ER 500 MG PO TB24
2000.0000 mg | ORAL_TABLET | Freq: Every day | ORAL | Status: DC
  Administered 2017-04-03 – 2017-04-05 (×3): 2000 mg via ORAL
  Filled 2017-04-03 (×3): qty 4

## 2017-04-03 MED ORDER — FENTANYL CITRATE (PF) 100 MCG/2ML IJ SOLN
50.0000 ug | Freq: Once | INTRAMUSCULAR | Status: AC
  Administered 2017-04-03: 50 ug via INTRAVENOUS
  Filled 2017-04-03: qty 2

## 2017-04-03 MED ORDER — TRAZODONE HCL 50 MG PO TABS
150.0000 mg | ORAL_TABLET | Freq: Every day | ORAL | Status: DC
  Filled 2017-04-03 (×2): qty 3

## 2017-04-03 MED ORDER — ALBUTEROL (5 MG/ML) CONTINUOUS INHALATION SOLN
INHALATION_SOLUTION | RESPIRATORY_TRACT | Status: AC
Start: 2017-04-03 — End: 2017-04-03
  Administered 2017-04-03: 04:00:00
  Filled 2017-04-03: qty 20

## 2017-04-03 MED ORDER — ENOXAPARIN SODIUM 40 MG/0.4ML ~~LOC~~ SOLN
40.0000 mg | SUBCUTANEOUS | Status: DC
  Administered 2017-04-03: 40 mg via SUBCUTANEOUS
  Filled 2017-04-03 (×4): qty 0.4

## 2017-04-03 MED ORDER — HYDROXYZINE HCL 25 MG PO TABS
50.0000 mg | ORAL_TABLET | Freq: Three times a day (TID) | ORAL | Status: DC | PRN
  Administered 2017-04-04 – 2017-04-05 (×3): 50 mg via ORAL
  Filled 2017-04-03 (×3): qty 2

## 2017-04-03 MED ORDER — METHYLPREDNISOLONE SODIUM SUCC 125 MG IJ SOLR
60.0000 mg | Freq: Four times a day (QID) | INTRAMUSCULAR | Status: DC
  Administered 2017-04-03 – 2017-04-04 (×4): 60 mg via INTRAVENOUS
  Filled 2017-04-03 (×4): qty 2

## 2017-04-03 MED ORDER — ONDANSETRON HCL 4 MG PO TABS
4.0000 mg | ORAL_TABLET | Freq: Four times a day (QID) | ORAL | Status: DC | PRN
  Administered 2017-04-04: 4 mg via ORAL
  Filled 2017-04-03: qty 1

## 2017-04-03 MED ORDER — ACETAMINOPHEN 650 MG RE SUPP: 650.0000 mg | Freq: Four times a day (QID) | RECTAL | Status: DC | PRN

## 2017-04-03 MED ORDER — ALBUTEROL SULFATE (2.5 MG/3ML) 0.083% IN NEBU: 5.0000 mg | INHALATION_SOLUTION | Freq: Once | RESPIRATORY_TRACT | Status: DC

## 2017-04-03 MED ORDER — CLONAZEPAM 0.5 MG PO TABS
1.0000 mg | ORAL_TABLET | Freq: Two times a day (BID) | ORAL | Status: DC
  Administered 2017-04-03 – 2017-04-06 (×7): 1 mg via ORAL
  Filled 2017-04-03 (×7): qty 2

## 2017-04-03 MED ORDER — IBUPROFEN 400 MG PO TABS
400.0000 mg | ORAL_TABLET | Freq: Four times a day (QID) | ORAL | Status: DC | PRN
  Administered 2017-04-03 – 2017-04-05 (×2): 400 mg via ORAL
  Filled 2017-04-03 (×2): qty 1

## 2017-04-03 MED ORDER — HYDROXYZINE HCL 25 MG PO TABS
50.0000 mg | ORAL_TABLET | Freq: Once | ORAL | Status: AC
  Administered 2017-04-03: 50 mg via ORAL
  Filled 2017-04-03: qty 2

## 2017-04-03 MED ORDER — MAGNESIUM SULFATE 2 GM/50ML IV SOLN
2.0000 g | Freq: Once | INTRAVENOUS | Status: AC
Start: 2017-04-03 — End: 2017-04-03
  Administered 2017-04-03: 2 g via INTRAVENOUS
  Filled 2017-04-03: qty 50

## 2017-04-03 MED ORDER — ORAL CARE MOUTH RINSE
15.0000 mL | Freq: Two times a day (BID) | OROMUCOSAL | Status: DC
  Administered 2017-04-03 – 2017-04-06 (×3): 15 mL via OROMUCOSAL

## 2017-04-03 MED ORDER — IPRATROPIUM BROMIDE 0.02 % IN SOLN
0.5000 mg | Freq: Once | RESPIRATORY_TRACT | Status: DC
  Filled 2017-04-03: qty 2.5

## 2017-04-03 MED ORDER — NICOTINE 21 MG/24HR TD PT24
21.0000 mg | MEDICATED_PATCH | Freq: Every day | TRANSDERMAL | Status: DC
  Administered 2017-04-03: 21 mg via TRANSDERMAL

## 2017-04-03 MED ORDER — MELATONIN 5 MG PO TABS: 10.0000 mg | ORAL_TABLET | Freq: Every day | ORAL | Status: DC

## 2017-04-03 MED ORDER — DEXTROSE 5 % IV SOLN
500.0000 mg | INTRAVENOUS | Status: DC
  Administered 2017-04-03 – 2017-04-06 (×4): 500 mg via INTRAVENOUS
  Filled 2017-04-03 (×5): qty 500

## 2017-04-03 MED ORDER — NICOTINE 21 MG/24HR TD PT24
21.0000 mg | MEDICATED_PATCH | Freq: Every day | TRANSDERMAL | Status: DC
  Administered 2017-04-03 – 2017-04-06 (×5): 21 mg via TRANSDERMAL
  Filled 2017-04-03 (×4): qty 1

## 2017-04-03 MED ORDER — METHYLPREDNISOLONE SODIUM SUCC 125 MG IJ SOLR
125.0000 mg | Freq: Once | INTRAMUSCULAR | Status: AC
  Administered 2017-04-03: 125 mg via INTRAVENOUS
  Filled 2017-04-03: qty 2

## 2017-04-03 MED ORDER — IPRATROPIUM-ALBUTEROL 0.5-2.5 (3) MG/3ML IN SOLN
3.0000 mL | Freq: Four times a day (QID) | RESPIRATORY_TRACT | Status: DC
  Administered 2017-04-03 – 2017-04-06 (×13): 3 mL via RESPIRATORY_TRACT
  Filled 2017-04-03 (×12): qty 3

## 2017-04-03 MED ORDER — OSELTAMIVIR PHOSPHATE 75 MG PO CAPS
75.0000 mg | ORAL_CAPSULE | Freq: Once | ORAL | Status: AC
  Administered 2017-04-03: 75 mg via ORAL
  Filled 2017-04-03: qty 1

## 2017-04-03 MED ORDER — ONDANSETRON HCL 4 MG/2ML IJ SOLN: 4.0000 mg | Freq: Four times a day (QID) | INTRAMUSCULAR | Status: DC | PRN

## 2017-04-03 MED ORDER — ESCITALOPRAM OXALATE 10 MG PO TABS
20.0000 mg | ORAL_TABLET | ORAL | Status: DC
  Administered 2017-04-03 – 2017-04-06 (×4): 20 mg via ORAL
  Filled 2017-04-03 (×4): qty 2

## 2017-04-03 MED ORDER — HYDROXYZINE HCL 25 MG PO TABS
ORAL_TABLET | ORAL | Status: AC
  Administered 2017-04-03: 50 mg via ORAL
  Filled 2017-04-03: qty 2

## 2017-04-03 MED ORDER — ALBUTEROL (5 MG/ML) CONTINUOUS INHALATION SOLN
15.0000 mg/h | INHALATION_SOLUTION | Freq: Once | RESPIRATORY_TRACT | Status: DC
  Filled 2017-04-03: qty 20

## 2017-04-03 MED ORDER — OXYCODONE-ACETAMINOPHEN 5-325 MG PO TABS
1.0000 | ORAL_TABLET | ORAL | Status: DC | PRN
  Administered 2017-04-03 – 2017-04-06 (×16): 1 via ORAL
  Filled 2017-04-03 (×16): qty 1

## 2017-04-03 MED ORDER — ACETAMINOPHEN 325 MG PO TABS: 650.0000 mg | ORAL_TABLET | Freq: Four times a day (QID) | ORAL | Status: DC | PRN

## 2017-04-03 MED ORDER — ALBUTEROL SULFATE (2.5 MG/3ML) 0.083% IN NEBU: 2.5000 mg | INHALATION_SOLUTION | RESPIRATORY_TRACT | Status: DC | PRN

## 2017-04-03 MED ORDER — PANTOPRAZOLE SODIUM 40 MG PO TBEC
40.0000 mg | DELAYED_RELEASE_TABLET | Freq: Every day | ORAL | Status: DC
  Administered 2017-04-03 – 2017-04-06 (×4): 40 mg via ORAL
  Filled 2017-04-03 (×4): qty 1

## 2017-04-03 MED ORDER — ADULT MULTIVITAMIN W/MINERALS CH
1.0000 | ORAL_TABLET | Freq: Every day | ORAL | Status: DC
  Administered 2017-04-03 – 2017-04-06 (×4): 1 via ORAL
  Filled 2017-04-03 (×4): qty 1

## 2017-04-03 MED ORDER — IPRATROPIUM BROMIDE 0.02 % IN SOLN
RESPIRATORY_TRACT | Status: AC
Start: 2017-04-03 — End: 2017-04-03
  Administered 2017-04-03: 05:00:00
  Filled 2017-04-03: qty 2.5

## 2017-04-03 MED ORDER — TRAMADOL HCL 50 MG PO TABS
50.0000 mg | ORAL_TABLET | Freq: Three times a day (TID) | ORAL | Status: DC | PRN
  Administered 2017-04-03: 50 mg via ORAL
  Filled 2017-04-03: qty 1

## 2017-04-03 MED ORDER — IPRATROPIUM-ALBUTEROL 0.5-2.5 (3) MG/3ML IN SOLN
3.0000 mL | Freq: Once | RESPIRATORY_TRACT | Status: AC
  Administered 2017-04-03: 3 mL via RESPIRATORY_TRACT
  Filled 2017-04-03: qty 3

## 2017-04-03 MED ORDER — DEXTROSE 5 % IV SOLN
1.0000 g | INTRAVENOUS | Status: DC
  Administered 2017-04-03 – 2017-04-06 (×4): 1 g via INTRAVENOUS
  Filled 2017-04-03 (×5): qty 10

## 2017-04-03 MED ORDER — MOMETASONE FURO-FORMOTEROL FUM 200-5 MCG/ACT IN AERO
2.0000 | INHALATION_SPRAY | Freq: Two times a day (BID) | RESPIRATORY_TRACT | Status: DC
  Administered 2017-04-03 – 2017-04-06 (×6): 2 via RESPIRATORY_TRACT
  Filled 2017-04-03: qty 8.8

## 2017-04-03 NOTE — ED Notes (Signed)
Pt ambulated from his bed to hallway and started c/o dizziness; pt ambulated back to bed with lowest O2 sat at 89% and did not go above 90%; pt requesting something to help him rest;  Dr. Wilkie AyeHorton notified and orders given and carried out

## 2017-04-03 NOTE — Progress Notes (Signed)

## 2017-04-03 NOTE — Clinical Social Work Note (Signed)
Clinical Social Work Assessment  Patient Details  Name: [name] MRN: [id number] Date of Birth: 08-03-1976  Date of referral:  04/03/17               Reason for consult:  Discharge Planning                Permission sought to share information with:    Permission granted to share information::     Name::        Agency::     Relationship::     Contact Information:     Housing/Transportation Living arrangements for the past 2 months:  Assisted Living Facility Source of Information:  Patient Patient Interpreter Needed:  None Criminal Activity/Legal Involvement Pertinent to Current Situation/Hospitalization:  No - Comment as needed Significant Relationships:  None Lives with:  Facility Resident Do you feel safe going back to the place where you live?  Yes Need for family participation in patient care:  Yes (Comment)  Care giving concerns: None identified by patient.    Social Worker assessment / plan:  Patient has been at [facility] since November.  He is unassisted in his ADLs.  He plans on returning. Patient reports that he has no family.    Employment status:  Disabled (Comment on whether or not currently receiving Disability) Insurance information:  [place] Recommendations:  Not assessed at this time Information / Referral to community resources:     Patient/Family's Response to care:  Patient is agreeable to return to [facility].  Patient/Family's Understanding of and Emotional Response to Diagnosis, Current Treatment, and Prognosis:  Patient understands his diagnosis, treatment and prognosis.   Emotional Assessment Appearance:  Appears stated age Attitude/Demeanor/Rapport:   (Cooperative) Affect (typically observed):  Accepting, Calm Orientation:  Oriented to Self, Oriented to Place, Oriented to  Time, Oriented to Situation Alcohol / Substance use:    Psych involvement (Current and /or in the community):     Discharge Needs   Concerns to be addressed:  Discharge Planning Concerns Readmission within the last 30 days:  No Current discharge risk:  None Barriers to Discharge:  No Barriers Identified   Annice NeedySettle, Cristiano Capri D, LCSW 04/03/2017, 12:46 PM

## 2017-04-03 NOTE — ED Provider Notes (Signed)
[facility]-EMER[facility]ENCY DEPT Provider Note   CSN: [id number] Arrival date & time: 04/03/17  0351     History   Chief Complaint Chief Complaint  Patient presents with  . Respiratory Distress    HPI [name] is a 41 y.o. male.  HPI  This is a 41 year old male who presents with shortness of breath. Patient reports 2 to three-day history of worsening shortness of breath. He reports anterior chest pressure associated with shortness of breath. He has tried nebulizer has at home with minimal relief.  He was given albuterol and Atrovent by EMS.  Patient denies any fevers or productive cough. He does report generalized bodyaches.  Past Medical History:  Diagnosis Date  . Anxiety   . Asthma   . Depression   . GERD (gastroesophageal reflux disease)   . Hypertension   . Hyponatremia 06/2016  . Seizures [facility](HCC)     Patient Active Problem List   Diagnosis Date Noted  . Musculoskeletal pain 06/28/2016  . Left flank pain   . Hyponatremia 06/27/2016  . 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 Pleural effusion, right 12/31/2015  . GERD (gastroesophageal reflux disease) 11/11/2015  . Borderline personality disorder 11/11/2015  . Alcohol use disorder, severe, in controlled environment (HCC) 11/11/2015  . 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 Tobacco abuse 11/05/2013  . HTN (hypertension) 11/05/2013    Past Surgical History:  Procedure Laterality Date  . FINGER SURGERY Left    5th digit  . PLEURAL EFFUSION DRAINAGE Right 01/02/2016   Procedure: DRAINAGE OF PLEURAL EFFUSION;  Surgeon: Loreli SlotSteven C Hendrickson, MD;  [facility];  Service: Thoracic;  Laterality: Right;  Marland Kitchen. VIDEO ASSISTED THORACOSCOPY (VATS)/DECORTICATION Right 01/02/2016   Procedure: VIDEO ASSISTED THORACOSCOPY (VATS)/DECORTICATION;  Surgeon: Loreli SlotSteven C Hendrickson, MD;  [facility];  Service:  Thoracic;  Laterality: Right;  Marland Kitchen. VIDEO BRONCHOSCOPY N/A 01/02/2016   Procedure: VIDEO BRONCHOSCOPY;  Surgeon: Loreli SlotSteven C Hendrickson, MD;  [facility];  Service: Thoracic;  Laterality: N/A;       Home Medications    Prior to Admission medications   Medication Sig Start Date End Date Taking? Authorizing Provider  amoxicillin-clavulanate (AUGMENTIN) 875-125 MG tablet Take 1 tablet by mouth 2 (two) times daily.   Yes [provider]  clonazePAM (KLONOPIN) 1 MG tablet Take 1 mg by mouth 2 (two) times daily.   Yes [provider]  divalproex (DEPAKOTE ER) 500 MG 24 hr tablet Take 4 tablets (2,000 mg total) by mouth at bedtime. 11/13/15  Yes Jimmy FootmanHernandez-Gonzalez, Andrea, MD  escitalopram (LEXAPRO) 20 MG tablet Take 20 mg by mouth every morning.   Yes [provider]  ferrous sulfate 325 (65 FE) MG tablet Take 1 tablet (325 mg total) by mouth 2 (two) times daily with a meal. 01/11/16  Yes Regalado, Belkys A, MD  Fluticasone-Salmeterol (ADVAIR) 250-50 MCG/DOSE AEPB Inhale 1 puff into the lungs 2 (two) times daily.   Yes [provider]  hydrOXYzine (ATARAX/VISTARIL) 50 MG tablet Take 50 mg by mouth 3 (three) times daily as needed for anxiety.   Yes [provider]  ibuprofen (ADVIL,MOTRIN) 800 MG tablet Take 800 mg by mouth 3 (three) times daily as needed for moderate pain.   Yes [provider]  Melatonin 5 MG TABS Take 10 mg by mouth at bedtime.   Yes [provider]  Multiple Vitamins-Minerals (THEREMS-M) TABS Take 1 tablet by  mouth daily.   Yes [provider]  traMADol (ULTRAM) 50 MG tablet Take 50 mg by mouth every 8 (eight) hours as needed.   Yes [provider]  traZODone (DESYREL) 150 MG tablet Take by mouth at bedtime.   Yes [provider]  loperamide (IMODIUM) 2 MG capsule Take 4 mg by mouth daily as needed for diarrhea or loose stools.     [provider]    Family History Family History    Problem Relation Age of Onset  . CAD Mother   . Diabetes Mellitus II Father   . Prostate cancer Paternal Grandfather     Social History Social History  Substance Use Topics  . Smoking status: Current Every Day Smoker    Packs/day: 0.50    Years: 20.00    Types: Cigarettes  . Smokeless tobacco: Never Used  . Alcohol use No     Comment: pt states he no longer drinks     Allergies   Penicillins   Review of Systems Review of Systems  Constitutional: Negative for fever.  Respiratory: Positive for shortness of breath and wheezing. Negative for cough.   Cardiovascular: Positive for chest pain. Negative for leg swelling.  Gastrointestinal: Negative for abdominal pain.  Musculoskeletal: Positive for myalgias.  All other systems reviewed and are negative.    Physical Exam Updated Vital Signs BP 127/75   Pulse (!) 103   Temp 97.9 F (36.6 C) (Oral)   Resp (!) 30   Ht 5\' 11"  (1.803 m)   Wt 193 lb (87.5 kg)   SpO2 91%   BMI 26.92 kg/m   Physical Exam  Constitutional: He is oriented to person, place, and time. No distress.  Ill-appearing but nontoxic, no acute distress  HENT:  Head: Normocephalic and atraumatic.  Eyes: Pupils are equal, round, and reactive to light.  Cardiovascular: Regular rhythm and normal heart sounds.   No murmur heard. Tachycardia  Pulmonary/Chest: No respiratory distress. He has wheezes.  Increased work of breathing, decreased air movement in all lung fields, expiratory wheezing noted  Abdominal: Soft. Bowel sounds are normal. There is no tenderness. There is no rebound.  Musculoskeletal: He exhibits no edema.  Neurological: He is alert and oriented to person, place, and time.  Skin: Skin is warm and dry.  Psychiatric: He has a normal mood and affect.  Nursing note and vitals reviewed.    ED Treatments / Results  Labs (all labs ordered are listed, but only abnormal results are displayed) Labs Reviewed  CBC WITH DIFFERENTIAL/PLATELET -  Abnormal; Notable for the following:       Result Value   RBC 4.14 (*)    HCT 38.1 (*)    Platelets 115 (*)    Lymphs Abs 0.3 (*)    All other components within normal limits  BASIC METABOLIC PANEL - Abnormal; Notable for the following:    Chloride 98 (*)    Glucose, Bld 144 (*)    BUN 5 (*)    Creatinine, Ser 0.60 (*)    Calcium 8.3 (*)    All other components within normal limits  TROPONIN I    EKG  EKG Interpretation  Date/Time:  Monday Apr 03 2017 03:55:57 EDT Ventricular Rate:  125 PR Interval:    QRS Duration: 86 QT Interval:  300 QTC Calculation: 433 R Axis:   86 Text Interpretation:  Sinus tachycardia RSR' in V1 or V2, right VCD or RVH Repol abnrm suggests ischemia, diffuse leads; likely demand  related Confirmed by Ross Marcus [phone]) on 04/03/2017 4:55:51 AM       Radiology Dg Chest Port 1 View  Result Date: 04/03/2017 CLINICAL DATA:  Shortness of breath for days.  Wheezing. EXAM: PORTABLE CHEST 1 VIEW COMPARISON:  Most recent comparison 11/19/2016. FINDINGS: Mild hyperinflation. The heart is at the upper limits normal in size. There is perihilar peribronchial cuffing. Patchy opacity at the left lung base. Small left pleural effusion, decreased from most recent comparison. No pneumothorax. No acute osseous abnormality. IMPRESSION: Perihilar peribronchial cuffing may be interstitial edema or bronchial thickening, this appears similar to prior exam. Persistent but improved left pleural effusion, with patchy left basilar opacity, likely atelectasis. Electronically Signed   By: Rubye Oaks M.D.   On: 04/03/2017 05:04    Procedures Procedures (including critical care time)  CRITICAL CARE Performed by: Shon Baton   Total critical care time: 30 minutes  Critical care time was exclusive of separately billable procedures and treating other patients.  Critical care was necessary to treat or prevent imminent or life-threatening  deterioration.  Critical care was time spent personally by me on the following activities: development of treatment plan with patient and/or surrogate as well as nursing, discussions with consultants, evaluation of patient's response to treatment, examination of patient, obtaining history from patient or surrogate, ordering and performing treatments and interventions, ordering and review of laboratory studies, ordering and review of radiographic studies, pulse oximetry and re-evaluation of patient's condition.   Angiocath insertion Performed by: Shon Baton  Consent: Verbal consent obtained. Risks and benefits: risks, benefits and alternatives were discussed Time out: Immediately prior to procedure a "time out" was called to verify the correct patient, procedure, equipment, support staff and site/side marked as required.  Preparation: Patient was prepped and draped in the usual sterile fashion.  Vein Location: left EJ   Gauge: 18  Normal blood return and flush without difficulty Patient tolerance: Patient tolerated the procedure well with no immediate complications.     Medications Ordered in ED Medications  albuterol (PROVENTIL) (2.5 MG/3ML) 0.083% nebulizer solution 5 mg ( Nebulization Canceled Entry 04/03/17 0528)  albuterol (PROVENTIL,VENTOLIN) solution continuous neb ( Nebulization Canceled Entry 04/03/17 0527)  ipratropium (ATROVENT) nebulizer solution 0.5 mg ( Nebulization Canceled Entry 04/03/17 0527)  ipratropium (ATROVENT) 0.02 % nebulizer solution (  Given 04/03/17 0528)  albuterol (PROVENTIL, VENTOLIN) (5 MG/ML) 0.5% continuous inhalation solution (  Given 04/03/17 0400)  methylPREDNISolone sodium succinate (SOLU-MEDROL) 125 mg/2 mL injection 125 mg (125 mg Intravenous Given 04/03/17 0501)  fentaNYL (SUBLIMAZE) injection 50 mcg (50 mcg Intravenous Given 04/03/17 0501)  ipratropium-albuterol (DUONEB) 0.5-2.5 (3) MG/3ML nebulizer solution 3 mL (3 mLs Nebulization Given  04/03/17 0617)     Initial Impression / Assessment and Plan / ED Course  I have reviewed the triage vital signs and the nursing notes.  Pertinent labs & imaging results that were available during my care of the patient were reviewed by me and considered in my medical decision making (see chart for details).  Clinical Course as of Apr 03 641  Mon Apr 03, 2017  0530 Checked on patient. Improved respiratory status. Supplemental oxygen removed. He still has fairly tight lungs. Repeat DuoNeb ordered.  [CH]  [phone] Patient is requesting something to sleep. On repeat examination after second DuoNeb, improved aeration. Will ambulate with pulse ox and provide Vistaril.  [CH]  0640 Patient ambulated.  Patient pulse ox 88-90%. Reports persistent shortness of breath. We will plan to admit.  [CH]  Clinical Course User Index [CH] Daisha Filosa, Mayer Masker, MD    Patient presents with shortness of breath and myalgias. He is very tight on initial examination. Tachycardic. Patient was given Solu-Medrol and a continuous DuoNeb. X-ray shows a persistent but smaller pleural effusion. Peribronchial thickening noted. Suspect viral etiology. Patient is afebrile. No pneumonia noted on x-ray. Lab work is largely reassuring. See clinical course above. A short with persistent shortness of breath following multiple rounds of albuterol. Will plan to admit for frequent nebs.    Final Clinical Impressions(s) / ED Diagnoses   Final diagnoses:  SOB (shortness of breath)  COPD exacerbation (HCC)    New Prescriptions New Prescriptions   No medications on file     Shon Baton, MD 04/03/17 [phone]

## 2017-04-03 NOTE — H&P (Signed)
[facility] H&P   Patient Demographics:    [name], is a 41 y.o. male  MRN: [id number]   DOB - 1975-12-26  Admit Date - 04/03/2017  Outpatient Primary MD for the patient is Vinnie Langton, NP  Referring MD/NP/Pa: Dr Wilkie Aye  Patient coming from: ALF  Chief Complaint  Patient presents with  . Respiratory Distress      HPI:    [name]  is a 41 y.o. male, With past medical history of depression, seizures, asthma, right lung empyema status post surgical drainage, anemia, tobacco abuse, alcohol abuse(in remission for last 3 years) hypertension, presents with complaints of shortness of breath, cough, chills, reports feeling her for last 2-3 days, with progressive dyspnea, and chest pain related to cough and dyspnea, and he tried his inhalers with no help, reports some chills, but denies any fever, reports some productive cough, white and minimal, for generalized body ache as well, denies any sick contacts, he received Solu-Medrol, and multiple nebs treatment, despite that he remains in significant dyspnea with significant wheezing, but no profound hypoxia, chest x-ray with no evidence of pneumonia, significant for left basilar atelectasis, I was called to admit for COPD exacerbation.   Review of systems:    In addition to the HPI above, He denies fever, but reports some chills No Headache, No changes with Vision or hearing, No problems swallowing food or Liquids, Reports some chest pain upon coughing, reports cough, mildly productive, reports dyspnea  No Abdominal pain, No Nausea or Vommitting, Bowel movements are regular, No Blood in stool or Urine, No dysuria, No new skin rashes or bruises, No new joints pains-aches, reports generalized body aches No foca new weakness, tingling, numbness in any extremity, reports generalized weakness No recent weight gain or loss, No polyuria,  polydypsia or polyphagia, No significant Mental Stressors.  A full 10 point Review of Systems was done, except as stated above, all other Review of Systems were negative.   With Past History of the following :    Past Medical History:  Diagnosis Date  . Anxiety   . Asthma   . Depression   . GERD (gastroesophageal reflux disease)   . Hypertension   . Hyponatremia 06/2016  . Seizures (HCC)       Past Surgical History:  Procedure Laterality Date  . FINGER SURGERY Left    5th digit  . PLEURAL EFFUSION DRAINAGE Right 01/02/2016   Procedure: DRAINAGE OF PLEURAL EFFUSION;  Surgeon: Loreli Slot, MD;  Location: [facility] OR;  Service: Thoracic;  Laterality: Right;  Marland Kitchen VIDEO ASSISTED THORACOSCOPY (VATS)/DECORTICATION Right 01/02/2016   Procedure: VIDEO ASSISTED THORACOSCOPY (VATS)/DECORTICATION;  Surgeon: Loreli Slot, MD;  Location: [facility] OR;  Service: Thoracic;  Laterality: Right;  Marland Kitchen VIDEO BRONCHOSCOPY N/A 01/02/2016   Procedure: VIDEO BRONCHOSCOPY;  Surgeon: Loreli Slot, MD;  Location: [facility] OR;  Service: Thoracic;  Laterality: N/A;  Social History:     Social History  Substance Use Topics  . Smoking status: Current Every Day Smoker    Packs/day: 0.50    Years: 20.00    Types: Cigarettes  . Smokeless tobacco: Never Used  . Alcohol use No     Comment: pt states he no longer drinks     Lives - At ALF  Mobility - independent    Family History :     Family History  Problem Relation Age of Onset  . CAD Mother   . Diabetes Mellitus II Father   . Prostate cancer Paternal Grandfather      Home Medications:   Prior to Admission medications   Medication Sig Start Date End Date Taking? Authorizing Provider  albuterol (PROVENTIL HFA;VENTOLIN HFA) 108 (90 Base) MCG/ACT inhaler Inhale 2 puffs into the lungs every 4 (four) hours as needed for wheezing or shortness of breath.   Yes [provider]  amLODipine (NORVASC) 10 MG tablet Take 10 mg by mouth  daily.   Yes [provider]  cyclobenzaprine (FLEXERIL) 10 MG tablet Take 10 mg by mouth every 8 (eight) hours as needed for muscle spasms.   Yes [provider]  disulfiram (ANTABUSE) 250 MG tablet Take 250 mg by mouth daily.   Yes [provider]  divalproex (DEPAKOTE ER) 500 MG 24 hr tablet Take 4 tablets (2,000 mg total) by mouth at bedtime. 11/13/15  Yes Jimmy Footman, MD  escitalopram (LEXAPRO) 20 MG tablet Take 20 mg by mouth every morning.   Yes [provider]  ferrous sulfate 325 (65 FE) MG tablet Take 1 tablet (325 mg total) by mouth 2 (two) times daily with a meal. 01/11/16  Yes Regalado, Belkys A, MD  Fluticasone-Salmeterol (ADVAIR) 250-50 MCG/DOSE AEPB Inhale 1 puff into the lungs 2 (two) times daily.   Yes [provider]  gabapentin (NEURONTIN) 300 MG capsule Take 300 mg by mouth 3 (three) times daily.   Yes [provider]  hydrOXYzine (ATARAX/VISTARIL) 50 MG tablet Take 100 mg by mouth 3 (three) times daily.    Yes [provider]  ibuprofen (ADVIL,MOTRIN) 800 MG tablet Take 800 mg by mouth 3 (three) times daily as needed for moderate pain.   Yes [provider]  loperamide (IMODIUM) 2 MG capsule Take 4 mg by mouth daily as needed for diarrhea or loose stools.    Yes [provider]  Melatonin 5 MG TABS Take 10 mg by mouth at bedtime.   Yes [provider]  Multiple Vitamins-Minerals (THEREMS-M) TABS Take 1 tablet by mouth daily.   Yes [provider]  primidone (MYSOLINE) 250 MG tablet Take 250 mg by mouth 3 (three) times daily.   Yes [provider]  QUEtiapine (SEROQUEL) 200 MG tablet Take 200 mg by mouth at bedtime.   Yes [provider]  traZODone (DESYREL) 150 MG tablet Take 300 mg by mouth 2 (two) times daily.    Yes [provider]     Allergies:     Allergies  Allergen Reactions  . Penicillins Nausea And Vomiting    Has patient  had a PCN reaction causing immediate rash, facial/tongue/throat swelling, SOB or lightheadedness with hypotension:No Has patient had a PCN reaction causing severe rash involving mucus membranes or skin necrosis:No Has patient had a PCN reaction that required hospitalization:No Has patient had a PCN reaction occurring within the last 10 years:NO If all of the above answers are "NO", then may proceed  with Cephalosporin use.       Physical Exam:   Vitals  Blood pressure 126/75, pulse 85, temperature 98.3 F (36.8 C), temperature source Oral, resp. rate 16, height 5\' 11"  (1.803 m), weight 87.5 kg (193 lb), SpO2 96 %.   1. General Well-developed male appears older than his stated age laying in bed in mild discomfort   2. Normal affect and insight, Not Suicidal or Homicidal, Awake Alert, Oriented X 3.  3. No F.N deficits, ALL C.Nerves Intact, Strength 5/5 all 4 extremities, Sensation intact all 4 extremities, Plantars down going.  4. Ears and Eyes appear Normal, Conjunctivae clear, PERRLA. Moist Oral Mucosa.  5. Supple Neck, No JVD, No cervical lymphadenopathy appriciated, No Carotid Bruits.  6. Symmetrical Chest wall movement, Good air movement bilaterally, some use of accessory muscles, has scattered rales and wheezing bilaterally.   7. Tachycardic, No Gallops, Rubs or Murmurs, No Parasternal Heave.  8. Positive Bowel Sounds, Abdomen Soft, No tenderness, No organomegaly appriciated,No rebound -guarding or rigidity.  9.  No Cyanosis, Normal Skin Turgor, No Skin Rash or Bruise.  10. Good muscle tone,  joints appear normal , no effusions, Normal ROM.  11. No Palpable Lymph Nodes in Neck or Axillae     Data Review:    CBC  Recent Labs Lab 04/03/17 0437  WBC 4.0  HGB 13.3  HCT 38.1*  PLT 115*  MCV 92.0  MCH 32.1  MCHC 34.9  RDW 14.0  LYMPHSABS 0.3*  MONOABS 0.6  EOSABS 0.1  BASOSABS 0.0    ------------------------------------------------------------------------------------------------------------------  Chemistries   Recent Labs Lab 04/03/17 0441  NA 136  K 3.5  CL 98*  CO2 31  GLUCOSE 144*  BUN 5*  CREATININE 0.60*  CALCIUM 8.3*  MG 1.5*   ------------------------------------------------------------------------------------------------------------------ estimated creatinine clearance is 129.4 mL/min (A) (by C-G formula based on SCr of 0.6 mg/dL (L)). ------------------------------------------------------------------------------------------------------------------ No results for input(s): TSH, T4TOTAL, T3FREE, THYROIDAB in the last 72 hours.  Invalid input(s): FREET3  Coagulation profile No results for input(s): INR, PROTIME in the last 168 hours. ------------------------------------------------------------------------------------------------------------------- No results for input(s): DDIMER in the last 72 hours. -------------------------------------------------------------------------------------------------------------------  Cardiac Enzymes  Recent Labs Lab 04/03/17 0441  TROPONINI <0.03   ------------------------------------------------------------------------------------------------------------------    Component Value Date/Time   BNP 30.9 02/11/2016 2215     ---------------------------------------------------------------------------------------------------------------  Urinalysis    Component Value Date/Time   COLORURINE YELLOW 06/27/2016 0253   APPEARANCEUR CLEAR 06/27/2016 0253   APPEARANCEUR Clear 09/11/2014 2308   LABSPEC 1.006 06/27/2016 0253   LABSPEC 1.003 09/11/2014 2308   PHURINE 7.0 06/27/2016 0253   GLUCOSEU NEGATIVE 06/27/2016 0253   GLUCOSEU Negative 09/11/2014 2308   HGBUR NEGATIVE 06/27/2016 0253   BILIRUBINUR NEGATIVE 06/27/2016 0253   BILIRUBINUR Negative 09/11/2014 2308   KETONESUR NEGATIVE 06/27/2016 0253    PROTEINUR NEGATIVE 06/27/2016 0253   NITRITE NEGATIVE 06/27/2016 0253   LEUKOCYTESUR NEGATIVE 06/27/2016 0253   LEUKOCYTESUR Negative 09/11/2014 2308    ----------------------------------------------------------------------------------------------------------------   Imaging Results:    Dg Chest Port 1 View  Result Date: 04/03/2017 CLINICAL DATA:  Shortness of breath for days.  Wheezing. EXAM: PORTABLE CHEST 1 VIEW COMPARISON:  Most recent comparison 11/19/2016. FINDINGS: Mild hyperinflation. The heart is at the upper limits normal in size. There is perihilar peribronchial cuffing. Patchy opacity at the left lung base. Small left pleural effusion, decreased from most recent comparison. No pneumothorax. No acute osseous abnormality. IMPRESSION: Perihilar peribronchial cuffing may be interstitial edema or bronchial thickening, this appears similar to  prior exam. Persistent but improved left pleural effusion, with patchy left basilar opacity, likely atelectasis. Electronically Signed   By: Rubye OaksMelanie  Ehinger M.D.   On: 04/03/2017 05:04    My personal review of EKG: Sinus tachycardia, Rate125  /min, QTc 433 no Acute ST changes   Assessment & Plan:    Active Problems:   Seizure disorder (HCC) and pseudoseizures   Tobacco abuse   HTN (hypertension)   MDD (major depressive disorder), recurrent severe, without psychosis (HCC)   GERD (gastroesophageal reflux disease)   COPD exacerbation (HCC)   COPD Exacerbation - Patient presents with significant dyspnea, cough, wheezing, some improvement with IV Solu-Medrol and multiple nebs treatment in ED, but remains with some significant wheezing, so he will be admitted to telemetry floor, will continue with IV steroids, we'll continue with DuoNeb, will continue with scheduled DuoNeb, Symbicort(we'll change to formulary), will start on Rocephin and azithromycin, as he does report some productive cough. - He is afebrile, but does state he does report some  chills and myalgia , so will check influenza panel, meanwhile will give one dose of Tamiflu pending results.  History of seizures - Continue with home medication Depakote  Chest pain - Nontypical, related to dyspnea and cough, EKG nonacute, negative troponin  Tobacco abuse - He was counseled, continue with home medication  History of alcohol abuse - He reports no alcohol use for last  years  Hypertension - Currently acceptable, will hold amlodipine, will resume in the next 24 hours continues to improve  History of depression/bipolar disorder - Stable, continue with Lexapro  DVT Prophylaxis Lovenox - SCDs  AM Labs Ordered, also please review Full Orders  Family Communication: Admission, patients condition and plan of care including tests being ordered have been discussed with the patient  who indicate understanding and agree with the plan and Code Status.  Code Status Full  Likely DC to  Back to ALF  Condition GUARDED    Consults called: none    Admission status: Observation  Time spent in minutes : 65 minutes   Lyman Balingit M.D on 04/03/2017 at 8:43 AM  Between 7am to 7pm - Pager - [phone]. After 7pm go to [email or web address] - password [email or web address]  [facility] - Office  [phone]

## 2017-04-03 NOTE — ED Triage Notes (Signed)
Pt brought in by [facility] for c/o sob for the last couple of days; pt has audible wheezing and was given 7.5mg  albuterol and 0.5mg  atrovent en route by [facility]

## 2017-04-04 DIAGNOSIS — F319 Bipolar disorder, unspecified: Secondary | ICD-10-CM | POA: Diagnosis present

## 2017-04-04 DIAGNOSIS — J441 Chronic obstructive pulmonary disease with (acute) exacerbation: Secondary | ICD-10-CM | POA: Diagnosis not present

## 2017-04-04 DIAGNOSIS — F332 Major depressive disorder, recurrent severe without psychotic features: Secondary | ICD-10-CM | POA: Diagnosis present

## 2017-04-04 DIAGNOSIS — G40909 Epilepsy, unspecified, not intractable, without status epilepticus: Secondary | ICD-10-CM | POA: Diagnosis present

## 2017-04-04 DIAGNOSIS — F1011 Alcohol abuse, in remission: Secondary | ICD-10-CM | POA: Diagnosis present

## 2017-04-04 DIAGNOSIS — F17210 Nicotine dependence, cigarettes, uncomplicated: Secondary | ICD-10-CM | POA: Diagnosis present

## 2017-04-04 DIAGNOSIS — Z79899 Other long term (current) drug therapy: Secondary | ICD-10-CM | POA: Diagnosis not present

## 2017-04-04 DIAGNOSIS — F445 Conversion disorder with seizures or convulsions: Secondary | ICD-10-CM | POA: Diagnosis present

## 2017-04-04 DIAGNOSIS — Z880 Allergy status to penicillin: Secondary | ICD-10-CM | POA: Diagnosis not present

## 2017-04-04 DIAGNOSIS — I10 Essential (primary) hypertension: Secondary | ICD-10-CM | POA: Diagnosis not present

## 2017-04-04 DIAGNOSIS — R0602 Shortness of breath: Secondary | ICD-10-CM | POA: Diagnosis present

## 2017-04-04 DIAGNOSIS — K219 Gastro-esophageal reflux disease without esophagitis: Secondary | ICD-10-CM | POA: Diagnosis present

## 2017-04-04 DIAGNOSIS — J9621 Acute and chronic respiratory failure with hypoxia: Secondary | ICD-10-CM | POA: Diagnosis not present

## 2017-04-04 LAB — BASIC METABOLIC PANEL
Anion gap: 9 (ref 5–15)
BUN: 12 mg/dL (ref 6–20)
CO2: 32 mmol/L (ref 22–32)
CREATININE: 0.59 mg/dL — AB (ref 0.61–1.24)
Calcium: 9 mg/dL (ref 8.9–10.3)
Chloride: 94 mmol/L — ABNORMAL LOW (ref 101–111)
GFR calc Af Amer: >60 mL/min (ref >60)
Glucose, Bld: 155 mg/dL — ABNORMAL HIGH (ref 65–99)
Potassium: 4.7 mmol/L (ref 3.5–5.1)
SODIUM: 135 mmol/L (ref 135–145)

## 2017-04-04 LAB — CBC
HEMATOCRIT: 37.6 % — AB (ref 39.0–52.0)
Hemoglobin: 12.9 g/dL — ABNORMAL LOW (ref 13.0–17.0)
MCH: 31.4 pg (ref 26.0–34.0)
MCHC: 34.3 g/dL (ref 30.0–36.0)
MCV: 91.5 fL (ref 78.0–100.0)
Platelets: 171 10*3/uL (ref 150–400)
RBC: 4.11 MIL/uL — AB (ref 4.22–5.81)
RDW: 13.8 % (ref 11.5–15.5)
WBC: 9.3 10*3/uL (ref 4.0–10.5)

## 2017-04-04 LAB — HIV ANTIBODY (ROUTINE TESTING W REFLEX): HIV SCREEN 4TH GENERATION: NONREACTIVE

## 2017-04-04 MED ORDER — PREDNISONE 20 MG PO TABS: 40.0000 mg | ORAL_TABLET | Freq: Every day | ORAL | Status: DC

## 2017-04-04 MED ORDER — METHYLPREDNISOLONE SODIUM SUCC 125 MG IJ SOLR
80.0000 mg | Freq: Three times a day (TID) | INTRAMUSCULAR | Status: DC
  Administered 2017-04-04 – 2017-04-06 (×7): 80 mg via INTRAVENOUS
  Filled 2017-04-04 (×7): qty 2

## 2017-04-04 MED ORDER — ALUM & MAG HYDROXIDE-SIMETH 200-200-20 MG/5ML PO SUSP
30.0000 mL | ORAL | Status: DC | PRN
  Administered 2017-04-04: 30 mL via ORAL
  Filled 2017-04-04: qty 30

## 2017-04-04 NOTE — Progress Notes (Signed)
PROGRESS NOTE                                                                                                                                                                                                             Patient Demographics:    [name], is a 41 y.o. male, DOB - 11/16/1976, UJW:[id number]  Admit date - 04/03/2017   Admitting Physician Starleen Arms, MD  Outpatient Primary MD for the patient is Vinnie Langton, NP  LOS - 0   Chief Complaint  Patient presents with  . Respiratory Distress       Brief Narrative    41 y.o. 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   Subjective:    [name] today has, No headache, No chest pain, Reports dyspnea is improving, still complains of cough, no fever or chills    Assessment  & Plan :    Active Problems:   Seizure disorder (HCC) and pseudoseizures   Tobacco abuse   HTN (hypertension)   MDD (major depressive disorder), recurrent severe, without psychosis (HCC)   GERD (gastroesophageal reflux disease)   COPD exacerbation (HCC)  COPD Exacerbation - Patient presents with significant dyspnea, cough, wheezing, he remains with significant wheezing today, so continue with current dose IV Solu-Medrol, and will continue with scheduled DuoNeb, continue with dulera (on Symbicort at home), given productive cough we'll continue with Rocephin and azithromycin, Mucinex,. - Influenza panel negative  Chest pain - Nontypical, no recurrence, related to cough and dyspnea, EKG nonacute, negative troponins on admission  History of seizures - Continue with  Depakote  Tobacco abuse - He was counseled, continue with home medication  History of  alcohol abuse - He reports no alcohol use for last 3  years  Hypertension - Currently acceptable, actually 103/157 this a.m., so we'll continue to hold amlodipine .  History of depression/bipolar disorder - Stable, continue with Lexapro   Code Status : Full  Family Communication  : none at bedside  Disposition Plan  : Back to group home when stable  Consults  :  None  Procedures  : none  DVT Prophylaxis  :  Lovenox -  SCDs  Lab Results  Component Value Date   PLT 171 04/04/2017    Antibiotics  :    Anti-infectives    Start     Dose/Rate Route Frequency Ordered Stop   04/03/17 1100  cefTRIAXone (ROCEPHIN) 1 g in dextrose 5 % 50 mL IVPB     1 g 100 mL/hr over 30 Minutes Intravenous Every 24 hours 04/03/17 0841     04/03/17 1000  azithromycin (ZITHROMAX) 500 mg in dextrose 5 % 250 mL IVPB     500 mg 250 mL/hr over 60 Minutes Intravenous Every 24 hours 04/03/17 0841     04/03/17 0815  oseltamivir (TAMIFLU) capsule 75 mg     75 mg Oral Once 04/03/17 0809 04/03/17 1030        Objective:   Vitals:   04/03/17 2115 04/04/17 0620 04/04/17 0833 04/04/17 0840  BP: 134/83 (!) 103/57    Pulse: 82 71    Resp: 18 18    Temp: 98.4 F (36.9 C) 97.7 F (36.5 C)    TempSrc: Oral Oral    SpO2: 98% 97% 98% 100%  Weight:      Height:        Wt Readings from Last 3 Encounters:  04/03/17 87.5 kg (193 lb)  11/19/16 97.5 kg (215 lb)  11/03/16 97.5 kg (215 lb)     Intake/Output Summary (Last 24 hours) at 04/04/17 1002 Last data filed at 04/04/17 0054  Gross per 24 hour  Intake             1690 ml  Output                0 ml  Net             1690 ml     Physical Exam  Awake Alert, Oriented X 3 Supple Neck,No JVD,  Symmetrical Chest wall movement,Mildly diminished air movement bilaterally, diffuse wheezing RRR,No Gallops,Rubs or new Murmurs, No Parasternal Heave +ve B.Sounds, Abd Soft, No tenderness, No rebound - guarding or rigidity. No Cyanosis, Clubbing or  edema, No new Rash or bruise      Data Review:    CBC  Recent Labs Lab 04/03/17 0437 04/04/17 0552  WBC 4.0 9.3  HGB 13.3 12.9*  HCT 38.1* 37.6*  PLT 115* 171  MCV 92.0 91.5  MCH 32.1 31.4  MCHC 34.9 34.3  RDW 14.0 13.8  LYMPHSABS 0.3*  --   MONOABS 0.6  --   EOSABS 0.1  --   BASOSABS 0.0  --     Chemistries   Recent Labs Lab 04/03/17 0441 04/04/17 0552  NA 136 135  K 3.5 4.7  CL 98* 94*  CO2 31 32  GLUCOSE 144* 155*  BUN 5* 12  CREATININE 0.60* 0.59*  CALCIUM 8.3* 9.0  MG 1.5*  --    ------------------------------------------------------------------------------------------------------------------ No results for input(s): CHOL, HDL, LDLCALC, TRIG, CHOLHDL, LDLDIRECT in the last 72 hours.  Lab Results  Component Value Date   HGBA1C 5.0 11/08/2015   ------------------------------------------------------------------------------------------------------------------ No results for input(s): TSH, T4TOTAL, T3FREE, THYROIDAB in the last 72 hours.  Invalid input(s): FREET3 ------------------------------------------------------------------------------------------------------------------ No results for input(s): VITAMINB12, FOLATE, FERRITIN, TIBC, IRON, RETICCTPCT in the last 72 hours.  Coagulation profile No results for input(s): INR, PROTIME in the last 168 hours.  No results for input(s): DDIMER in the last 72 hours.  Cardiac Enzymes  Recent Labs Lab 04/03/17 0441  TROPONINI <0.03   ------------------------------------------------------------------------------------------------------------------  Component Value Date/Time   BNP 30.9 02/11/2016 2215    Inpatient Medications  Scheduled Meds: . albuterol  5 mg Nebulization Once  . albuterol  15 mg/hr Nebulization Once  . clonazePAM  1 mg Oral BID  . divalproex  2,000 mg Oral QHS  . enoxaparin (LOVENOX) injection  40 mg Subcutaneous Q24H  . escitalopram  20 mg Oral BH-q7a  . ipratropium  0.5 mg  Nebulization Once  . ipratropium-albuterol  3 mL Nebulization QID  . mouth rinse  15 mL Mouth Rinse BID  . methylPREDNISolone (SOLU-MEDROL) injection  80 mg Intravenous Q8H  . mometasone-formoterol  2 puff Inhalation BID  . multivitamin with minerals  1 tablet Oral Daily  . nicotine  21 mg Transdermal Daily  . pantoprazole  40 mg Oral Daily  . traZODone  150 mg Oral QHS   Continuous Infusions: . azithromycin 500 mg (04/04/17 0951)  . cefTRIAXone (ROCEPHIN)  IV Stopped (04/03/17 1708)   PRN Meds:.acetaminophen **OR** acetaminophen, albuterol, hydrOXYzine, ibuprofen, ondansetron **OR** ondansetron (ZOFRAN) IV, oxyCODONE-acetaminophen, traMADol  Micro Results No results found for this or any previous visit (from the past 240 hour(s)).  Radiology Reports Dg Chest Port 1 View  Result Date: 04/03/2017 CLINICAL DATA:  Shortness of breath for days.  Wheezing. EXAM: PORTABLE CHEST 1 VIEW COMPARISON:  Most recent comparison 11/19/2016. FINDINGS: Mild hyperinflation. The heart is at the upper limits normal in size. There is perihilar peribronchial cuffing. Patchy opacity at the left lung base. Small left pleural effusion, decreased from most recent comparison. No pneumothorax. No acute osseous abnormality. IMPRESSION: Perihilar peribronchial cuffing may be interstitial edema or bronchial thickening, this appears similar to prior exam. Persistent but improved left pleural effusion, with patchy left basilar opacity, likely atelectasis. Electronically Signed   By: Rubye OaksMelanie  Ehinger M.D.   On: 04/03/2017 05:04     ELGERGAWY, DAWOOD M.D on 04/04/2017 at 10:02 AM  Between 7am to 7pm - Pager - [phone]  After 7pm go to [email or web address] - password [email or web address]  [facility] -  Office  [phone]

## 2017-04-04 NOTE — Care Management Note (Signed)
Case Management Note  Patient Details  Name: Kaoru Dale Sangster MRN: [id number] Date of Birth: 01-May-1976  Subjective/Objective: Adm from [facility] group home with Ind PTA. No HH or DME PTA. Currently on oxygen.             Action/Plan: Plans to return to group home at time of discharge. Anticipate he will be able to wean off oxygen.    Expected Discharge Date:       04/06/2017           Expected Discharge Plan:  Assisted Living / Rest Home  In-House Referral:  Clinical Social Work  Discharge planning Services  CM Consult  Post Acute Care Choice:    Choice offered to:     DME Arranged:    DME Agency:     HH Arranged:    HH Agency:     Status of Service:  In process, will continue to follow  If discussed at Long Length of Stay Meetings, dates discussed:    Additional Comments:  Ifeoma Vallin, Chrystine OilerSharley Diane, RN 04/04/2017, 9:48 AM

## 2017-04-04 NOTE — Care Management Obs Status (Signed)
MEDICARE OBSERVATION STATUS NOTIFICATION   Patient Details  Name: [name] MRN: [id number] Date of Birth: July 02, 1976   Medicare Observation Status Notification Given:  Yes    Ellary Casamento, Chrystine OilerSharley Diane, RN 04/04/2017, 9:46 AM

## 2017-04-05 DIAGNOSIS — J9621 Acute and chronic respiratory failure with hypoxia: Secondary | ICD-10-CM

## 2017-04-05 LAB — CBC
HEMATOCRIT: 39.2 % (ref 39.0–52.0)
HEMOGLOBIN: 13.6 g/dL (ref 13.0–17.0)
MCH: 32.1 pg (ref 26.0–34.0)
MCHC: 34.7 g/dL (ref 30.0–36.0)
MCV: 92.5 fL (ref 78.0–100.0)
Platelets: 205 10*3/uL (ref 150–400)
RBC: 4.24 MIL/uL (ref 4.22–5.81)
RDW: 14.1 % (ref 11.5–15.5)
WBC: 9.8 10*3/uL (ref 4.0–10.5)

## 2017-04-05 LAB — BASIC METABOLIC PANEL
Anion gap: 7 (ref 5–15)
BUN: 13 mg/dL (ref 6–20)
CHLORIDE: 94 mmol/L — AB (ref 101–111)
CO2: 31 mmol/L (ref 22–32)
CREATININE: 0.61 mg/dL (ref 0.61–1.24)
Calcium: 9.1 mg/dL (ref 8.9–10.3)
GFR calc non Af Amer: >60 mL/min (ref >60)
GLUCOSE: 110 mg/dL — AB (ref 65–99)
Potassium: 5 mmol/L (ref 3.5–5.1)
Sodium: 132 mmol/L — ABNORMAL LOW (ref 135–145)

## 2017-04-05 NOTE — Progress Notes (Signed)
PROGRESS NOTE    [name]  WUJ:[id number] DOB: [birth year] DOA: 04/03/2017 PCP: Vinnie Langton, NP     Brief Narrative:  41 y/o man resident of a group home admitted to the hospital on 5/14 due to SOB and was admitted for a COPD exacerbation. He is an active smoker   Assessment & Plan:   Active Problems:   Seizure disorder (HCC) and pseudoseizures   Tobacco abuse   HTN (hypertension)   MDD (major depressive disorder), recurrent severe, without psychosis (HCC)   GERD (gastroesophageal reflux disease)   COPD exacerbation (HCC)   Acute Hypoxemic Respiratory Failure -Still with significant oxygen requirements (sats dropping to 84% with ambulation). -Will require home oxygen. -Due to COPD with exacerbation; see below for details.  COPD with Acute Exacerbation -Still with minimal wheezing, altho improved from days prior. -He also still has a subjective feeling of dyspnea with ambulation. -Continue abx, nebs, steroids.  Tobacco Abuse -Counseled on cessation.  History of seizures - Continue with  Depakote -No active seizures while in the hospital.  History of alcohol abuse - He reports no alcohol use for last 3 years  Hypertension - Currently acceptable, norvasc remains on hold (? Need for continued OP use).  History of depression/bipolar disorder - Stable, continue with Lexapro    DVT prophylaxis: lovenox Code Status: full code Family Communication: patient only Disposition Plan: hope for DC back to group home next 24-48 hours  Consultants:   None  Procedures:   None  Antimicrobials:  Anti-infectives    Start     Dose/Rate Route Frequency Ordered Stop   04/03/17 1100  cefTRIAXone (ROCEPHIN) 1 g in dextrose 5 % 50 mL IVPB     1 g 100 mL/hr over 30 Minutes Intravenous Every 24 hours 04/03/17 0841     04/03/17 1000  azithromycin (ZITHROMAX) 500 mg in dextrose 5 % 250 mL IVPB     500 mg 250 mL/hr over 60 Minutes Intravenous Every 24 hours  04/03/17 0841     04/03/17 0815  oseltamivir (TAMIFLU) capsule 75 mg     75 mg Oral Once 04/03/17 0809 04/03/17 1030       Subjective: Still feels SOB and lightheaded with ambulation.  Objective: Vitals:   04/05/17 0807 04/05/17 0813 04/05/17 1133 04/05/17 1415  BP:    122/86  Pulse:    90  Resp:    16  Temp:    97.8 F (36.6 C)  TempSrc:    Oral  SpO2: 94% 100% 95% 98%  Weight:      Height:        Intake/Output Summary (Last 24 hours) at 04/05/17 1558 Last data filed at 04/05/17 1228  Gross per 24 hour  Intake              480 ml  Output                0 ml  Net              480 ml   Filed Weights   04/03/17 0355  Weight: 87.5 kg (193 lb)    Examination:  General exam: Alert, awake, oriented x 3 Respiratory system: Minimal end-expiratory wheezes Cardiovascular system:RRR. No murmurs, rubs, gallops. Gastrointestinal system: Abdomen is nondistended, soft and nontender. No organomegaly or masses felt. Normal bowel sounds heard. Central nervous system: Alert and oriented. No focal neurological deficits. Extremities: No C/C/E, +pedal pulses Skin: No rashes, lesions or ulcers Psychiatry: Judgement and insight appear  normal. Mood & affect appropriate.     Data Reviewed: I have personally reviewed following labs and imaging studies  CBC:  Recent Labs Lab 04/03/17 0437 04/04/17 0552 04/05/17 0550  WBC 4.0 9.3 9.8  NEUTROABS 3.1  --   --   HGB 13.3 12.9* 13.6  HCT 38.1* 37.6* 39.2  MCV 92.0 91.5 92.5  PLT 115* 171 205   Basic Metabolic Panel:  Recent Labs Lab 04/03/17 0441 04/04/17 0552 04/05/17 0550  NA 136 135 132*  K 3.5 4.7 5.0  CL 98* 94* 94*  CO2 31 32 31  GLUCOSE 144* 155* 110*  BUN 5* 12 13  CREATININE 0.60* 0.59* 0.61  CALCIUM 8.3* 9.0 9.1  MG 1.5*  --   --   PHOS 2.9  --   --    GFR: Estimated Creatinine Clearance: 129.4 mL/min (by C-G formula based on SCr of 0.61 mg/dL). Liver Function Tests: No results for input(s): AST, ALT,  ALKPHOS, BILITOT, PROT, ALBUMIN in the last 168 hours. No results for input(s): LIPASE, AMYLASE in the last 168 hours. No results for input(s): AMMONIA in the last 168 hours. Coagulation Profile: No results for input(s): INR, PROTIME in the last 168 hours. Cardiac Enzymes:  Recent Labs Lab 04/03/17 0441  TROPONINI <0.03   BNP (last 3 results) No results for input(s): PROBNP in the last 8760 hours. HbA1C: No results for input(s): HGBA1C in the last 72 hours. CBG: No results for input(s): GLUCAP in the last 168 hours. Lipid Profile: No results for input(s): CHOL, HDL, LDLCALC, TRIG, CHOLHDL, LDLDIRECT in the last 72 hours. Thyroid Function Tests: No results for input(s): TSH, T4TOTAL, FREET4, T3FREE, THYROIDAB in the last 72 hours. Anemia Panel: No results for input(s): VITAMINB12, FOLATE, FERRITIN, TIBC, IRON, RETICCTPCT in the last 72 hours. Urine analysis:    Component Value Date/Time   COLORURINE YELLOW 06/27/2016 0253   APPEARANCEUR CLEAR 06/27/2016 0253   APPEARANCEUR Clear 09/11/2014 2308   LABSPEC 1.006 06/27/2016 0253   LABSPEC 1.003 09/11/2014 2308   PHURINE 7.0 06/27/2016 0253   GLUCOSEU NEGATIVE 06/27/2016 0253   GLUCOSEU Negative 09/11/2014 2308   HGBUR NEGATIVE 06/27/2016 0253   BILIRUBINUR NEGATIVE 06/27/2016 0253   BILIRUBINUR Negative 09/11/2014 2308   KETONESUR NEGATIVE 06/27/2016 0253   PROTEINUR NEGATIVE 06/27/2016 0253   NITRITE NEGATIVE 06/27/2016 0253   LEUKOCYTESUR NEGATIVE 06/27/2016 0253   LEUKOCYTESUR Negative 09/11/2014 2308   Sepsis Labs: @LABRCNTIP (procalcitonin:4,lacticidven:4)  )No results found for this or any previous visit (from the past 240 hour(s)).       Radiology Studies: No results found.      Scheduled Meds: . clonazePAM  1 mg Oral BID  . divalproex  2,000 mg Oral QHS  . enoxaparin (LOVENOX) injection  40 mg Subcutaneous Q24H  . escitalopram  20 mg Oral BH-q7a  . ipratropium-albuterol  3 mL Nebulization QID  .  mouth rinse  15 mL Mouth Rinse BID  . methylPREDNISolone (SOLU-MEDROL) injection  80 mg Intravenous Q8H  . mometasone-formoterol  2 puff Inhalation BID  . multivitamin with minerals  1 tablet Oral Daily  . nicotine  21 mg Transdermal Daily  . pantoprazole  40 mg Oral Daily  . traZODone  150 mg Oral QHS   Continuous Infusions: . azithromycin Stopped (04/05/17 1119)  . cefTRIAXone (ROCEPHIN)  IV Stopped (04/05/17 1228)     LOS: 1 day    Time spent: 25 minutes. Greater than 50% of this time was spent in direct contact with the  patient coordinating care.     Chaya JanHERNANDEZ ACOSTA,ESTELA, MD [facility] Pager [phone]  If 7PM-7AM, please contact night-coverage [email or web address] Password [email or web address] 04/05/2017, 3:58 PM

## 2017-04-05 NOTE — Progress Notes (Signed)
SATURATION QUALIFICATIONS: (This note is used to comply with regulatory documentation for home oxygen)  Patient Saturations on Room Air at Rest = 97%  Patient Saturations on Room Air while Ambulating = 84%  Patient Saturations on 2 Liters of oxygen while Ambulating = 93%  Please briefly explain why patient needs home oxygen:

## 2017-04-05 NOTE — Care Management (Addendum)
    Durable Medical Equipment        Start     Ordered   04/05/17 1345  For home use only DME oxygen  Once    Question Answer Comment  Mode or (Route) Nasal cannula   Liters per Minute 2   Frequency Continuous (stationary and portable oxygen unit needed)   Oxygen conserving device Yes   Oxygen delivery system Gas      04/05/17 1344    Alternate treatments have been tried and found insufficient, such as IV antibiotics, IV steroids and nebulizer treatments

## 2017-04-05 NOTE — Care Management (Signed)
Patient qualifies for home oxygen. Patient offered choice of DME agencies. Would like [facility]. Alroy BailiffLinda Lothian [facility] notified and will obtain orders from chart. Oxygen tank will be delivered to room.

## 2017-04-06 DIAGNOSIS — J9621 Acute and chronic respiratory failure with hypoxia: Secondary | ICD-10-CM

## 2017-04-06 MED ORDER — AZITHROMYCIN 250 MG PO TABS
500.0000 mg | ORAL_TABLET | Freq: Every day | ORAL | Status: DC
  Administered 2017-04-06: 500 mg via ORAL
  Filled 2017-04-06: qty 2

## 2017-04-06 MED ORDER — PREDNISONE 10 MG PO TABS: 10.0000 mg | ORAL_TABLET | Freq: Every day | ORAL | 0 refills | Status: DC

## 2017-04-06 MED ORDER — LEVOFLOXACIN 500 MG PO TABS: 500.0000 mg | ORAL_TABLET | Freq: Every day | ORAL | 0 refills | Status: AC

## 2017-04-06 NOTE — Clinical Social Work Note (Addendum)
LCSW spoke with [name], facility staff, and advised of discharge. He stated that he would pick patient up within the hour.   Patient is aware of discharge.   LCSW faxed clinicals. LCSW signing off.    [name], [name], LCSW

## 2017-04-06 NOTE — Progress Notes (Signed)
PHARMACIST - PHYSICIAN COMMUNICATION DR:   Ardyth HarpsHernandez CONCERNING: Antibiotic IV to Oral Route Change Policy  RECOMMENDATION: This patient is receiving ZITHROMAX by the intravenous route.  Based on criteria approved by the Pharmacy and Therapeutics Committee, the antibiotic(s) is/are being converted to the equivalent oral dose form(s).  Nurse reports that IV infiltrated.    DESCRIPTION: These criteria include:  Patient being treated for a respiratory tract infection, urinary tract infection, cellulitis or clostridium difficile associated diarrhea if on metronidazole  The patient is not neutropenic and does not exhibit a GI malabsorption state  The patient is eating (either orally or via tube) and/or has been taking other orally administered medications for a least 24 hours  The patient is improving clinically and has a Tmax < 100.5  If you have questions about this conversion, please contact the Pharmacy Department  [x]   [phone]( [phone] )  [facility] []   [phone]( [phone] )  [facility] []   [phone]( [phone]  [facility] []   [phone]  [facility] []   [phone]( [phone] )  [facility]    Valrie HartScott Myka Hitz, PharmD Clinical Pharmacist Pager:  [phone] 04/06/2017

## 2017-04-06 NOTE — Discharge Summary (Signed)
Physician Discharge Summary  [name] ZOX:[id number] DOB: 10/17/1976 DOA: 04/03/2017  PCP: Vinnie Langton, NP  Admit date: 04/03/2017 Discharge date: 04/06/2017  Time spent: 45 minutes  Recommendations for Outpatient Follow-up:  -Will be discharged back to ALF today.   Discharge Diagnoses:  Active Problems:   Seizure disorder (HCC) and pseudoseizures   Tobacco abuse   HTN (hypertension)   MDD (major depressive disorder), recurrent severe, without psychosis (HCC)   GERD (gastroesophageal reflux disease)   COPD exacerbation (HCC)   Acute on chronic respiratory failure with hypoxia [facility])   Discharge Condition: Stable and improved  Filed Weights   04/03/17 0355  Weight: 87.5 kg (193 lb)    History of present illness:  As per Dr. Randol Kern on 5/14: [name]  is a 41 y.o. male, With past medical history of depression, seizures, asthma, right lung empyema status post surgical drainage, anemia, tobacco abuse, alcohol abuse(in remission for last 3 years) hypertension, presents with complaints of shortness of breath, cough, chills, reports feeling her for last 2-3 days, with progressive dyspnea, and chest pain related to cough and dyspnea, and he tried his inhalers with no help, reports some chills, but denies any fever, reports some productive cough, white and minimal, for generalized body ache as well, denies any sick contacts, he received Solu-Medrol, and multiple nebs treatment, despite that he remains in significant dyspnea with significant wheezing, but no profound hypoxia, chest x-ray with no evidence of pneumonia, significant for left basilar atelectasis, I was called to admit for COPD exacerbation.  Hospital Course:   Acute Hypoxemic Respiratory Failure -Still with significant oxygen requirements (sats dropping to 84% with ambulation). -Will require home oxygen. -Due to COPD with exacerbation; see below for details.  COPD with Acute Exacerbation -Still with  minimal wheezing, altho improved from days prior. -Continue levaquin for 5 more days, prednisone taper, symbicort, spiriva and PRN albuterol.  Tobacco Abuse -Counseled on cessation.  History of seizures - Continue with Depakote -No active seizures while in the hospital.  History of alcohol abuse - He reports no alcohol use for last 3 years  Hypertension - Currently acceptable, norvasc remains on hold (? Need for continued OP use).  History of depression/bipolar disorder - Stable, continue with Lexapro   Procedures:  None   Consultations:  None  Discharge Instructions  Discharge Instructions    Diet - low sodium heart healthy    Complete by:  As directed    Increase activity slowly    Complete by:  As directed      Allergies as of 04/06/2017      Reactions   Penicillins Nausea And Vomiting   Has patient had a PCN reaction causing immediate rash, facial/tongue/throat swelling, SOB or lightheadedness with hypotension:No Has patient had a PCN reaction causing severe rash involving mucus membranes or skin necrosis:No Has patient had a PCN reaction that required hospitalization:No Has patient had a PCN reaction occurring within the last 10 years:NO If all of the above answers are "NO", then may proceed with Cephalosporin use.      Medication List    TAKE these medications   albuterol 108 (90 Base) MCG/ACT inhaler Commonly known as:  PROVENTIL HFA;VENTOLIN HFA Inhale 2 puffs into the lungs every 4 (four) hours as needed for wheezing or shortness of breath.   amLODipine 10 MG tablet Commonly known as:  NORVASC Take 10 mg by mouth daily.   cyclobenzaprine 10 MG tablet Commonly known as:  FLEXERIL Take 10  mg by mouth every 8 (eight) hours as needed for muscle spasms.   disulfiram 250 MG tablet Commonly known as:  ANTABUSE Take 250 mg by mouth daily.   divalproex 500 MG 24 hr tablet Commonly known as:  DEPAKOTE ER Take 4 tablets (2,000 mg total) by  mouth at bedtime.   escitalopram 20 MG tablet Commonly known as:  LEXAPRO Take 20 mg by mouth every morning.   ferrous sulfate 325 (65 FE) MG tablet Take 1 tablet (325 mg total) by mouth 2 (two) times daily with a meal.   Fluticasone-Salmeterol 250-50 MCG/DOSE Aepb Commonly known as:  ADVAIR Inhale 1 puff into the lungs 2 (two) times daily.   gabapentin 300 MG capsule Commonly known as:  NEURONTIN Take 300 mg by mouth 3 (three) times daily.   hydrOXYzine 50 MG tablet Commonly known as:  ATARAX/VISTARIL Take 100 mg by mouth 3 (three) times daily.   ibuprofen 800 MG tablet Commonly known as:  ADVIL,MOTRIN Take 800 mg by mouth 3 (three) times daily as needed for moderate pain.   levofloxacin 500 MG tablet Commonly known as:  LEVAQUIN Take 1 tablet (500 mg total) by mouth daily.   loperamide 2 MG capsule Commonly known as:  IMODIUM Take 4 mg by mouth daily as needed for diarrhea or loose stools.   Melatonin 5 MG Tabs Take 10 mg by mouth at bedtime.   predniSONE 10 MG tablet Commonly known as:  DELTASONE Take 1 tablet (10 mg total) by mouth daily with breakfast. Take 6 tablets today and then decrease by 1 tablet daily until none are left.   primidone 250 MG tablet Commonly known as:  MYSOLINE Take 250 mg by mouth 3 (three) times daily.   QUEtiapine 200 MG tablet Commonly known as:  SEROQUEL Take 200 mg by mouth at bedtime.   THEREMS-M Tabs Take 1 tablet by mouth daily.   traZODone 150 MG tablet Commonly known as:  DESYREL Take 300 mg by mouth 2 (two) times daily.            Durable Medical Equipment        Start     Ordered   04/05/17 1345  For home use only DME oxygen  Once    Question Answer Comment  Mode or (Route) Nasal cannula   Liters per Minute 2   Frequency Continuous (stationary and portable oxygen unit needed)   Oxygen conserving device Yes   Oxygen delivery system Gas      04/05/17 1344     Allergies  Allergen Reactions  .  Penicillins Nausea And Vomiting    Has patient had a PCN reaction causing immediate rash, facial/tongue/throat swelling, SOB or lightheadedness with hypotension:No Has patient had a PCN reaction causing severe rash involving mucus membranes or skin necrosis:No Has patient had a PCN reaction that required hospitalization:No Has patient had a PCN reaction occurring within the last 10 years:NO If all of the above answers are "NO", then may proceed with Cephalosporin use.     Follow-up Information    Vinnie LangtonIbrahim, Sadaou, NP. Schedule an appointment as soon as possible for a visit in 2 week(s).   Specialty:  Nurse Practitioner Contact information: [address] [place] [address] [phone]            The results of significant diagnostics from this hospitalization (including imaging, microbiology, ancillary and laboratory) are listed below for reference.    Significant Diagnostic Studies: Dg Chest Port 1 View  Result Date: 04/03/2017  CLINICAL DATA:  Shortness of breath for days.  Wheezing. EXAM: PORTABLE CHEST 1 VIEW COMPARISON:  Most recent comparison 11/19/2016. FINDINGS: Mild hyperinflation. The heart is at the upper limits normal in size. There is perihilar peribronchial cuffing. Patchy opacity at the left lung base. Small left pleural effusion, decreased from most recent comparison. No pneumothorax. No acute osseous abnormality. IMPRESSION: Perihilar peribronchial cuffing may be interstitial edema or bronchial thickening, this appears similar to prior exam. Persistent but improved left pleural effusion, with patchy left basilar opacity, likely atelectasis. Electronically Signed   By: Rubye Oaks M.D.   On: 04/03/2017 05:04    Microbiology: No results found for this or any previous visit (from the past 240 hour(s)).   Labs: Basic Metabolic Panel:  Recent Labs Lab 04/03/17 0441 04/04/17 0552 04/05/17 0550  NA 136 135 132*  K 3.5 4.7 5.0  CL 98* 94* 94*  CO2 31 32 31    GLUCOSE 144* 155* 110*  BUN 5* 12 13  CREATININE 0.60* 0.59* 0.61  CALCIUM 8.3* 9.0 9.1  MG 1.5*  --   --   PHOS 2.9  --   --    Liver Function Tests: No results for input(s): AST, ALT, ALKPHOS, BILITOT, PROT, ALBUMIN in the last 168 hours. No results for input(s): LIPASE, AMYLASE in the last 168 hours. No results for input(s): AMMONIA in the last 168 hours. CBC:  Recent Labs Lab 04/03/17 0437 04/04/17 0552 04/05/17 0550  WBC 4.0 9.3 9.8  NEUTROABS 3.1  --   --   HGB 13.3 12.9* 13.6  HCT 38.1* 37.6* 39.2  MCV 92.0 91.5 92.5  PLT 115* 171 205   Cardiac Enzymes:  Recent Labs Lab 04/03/17 0441  TROPONINI <0.03   BNP: BNP (last 3 results) No results for input(s): BNP in the last 8760 hours.  ProBNP (last 3 results) No results for input(s): PROBNP in the last 8760 hours.  CBG: No results for input(s): GLUCAP in the last 168 hours.     SignedChaya Jan  [facility] Pager: [phone] 04/06/2017, 12:38 PM

## 2017-04-06 NOTE — NC FL2 (Signed)
[place] MEDICAID FL2 LEVEL OF CARE SCREENING TOOL     IDENTIFICATION  Patient Name: Larenz Frasier Birthdate: [birth year] Sex: male Admission Date (Current Location): 04/03/2017  [facility] and [place] Number:  [place] Facility and Address:  [facility],  [address] [place] [address]      Provider Number: [id number]  Attending Physician Name and Address:  Philip Aspen, Minerva Ends*  Relative Name and Phone Number:       Current Level of Care: Hospital Recommended Level of Care: [facility] Prior Approval Number:    Date Approved/Denied:   PASRR Number: [id number]  Discharge Plan: Domiciliary (Rest home)    Current Diagnoses: Patient Active Problem List   Diagnosis Date Noted  . Acute on chronic respiratory failure with hypoxia (HCC) 04/05/2017  . COPD exacerbation (HCC) 04/03/2017  . Musculoskeletal pain 06/28/2016  . Left flank pain   . Hyponatremia 06/27/2016  . 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 Pleural effusion, right 12/31/2015  . GERD (gastroesophageal reflux disease) 11/11/2015  . Borderline personality disorder 11/11/2015  . Alcohol use disorder, severe, in controlled environment (HCC) 11/11/2015  . 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 Tobacco abuse 11/05/2013  . HTN (hypertension) 11/05/2013    Orientation RESPIRATION BLADDER Height & Weight     Self, Time, Situation, Place  Normal Continent Weight: 193 lb (87.5 kg) Height:  5\' 11"  (180.3 cm)  BEHAVIORAL SYMPTOMS/MOOD NEUROLOGICAL BOWEL NUTRITION STATUS      Continent Diet (Low Sodium/Heart Healthy)  AMBULATORY STATUS COMMUNICATION OF NEEDS Skin   Independent Verbally Normal                       Personal Care Assistance Level of Assistance  Bathing, Feeding, Dressing Bathing Assistance: Independent Feeding assistance:  Independent Dressing Assistance: Independent     Functional Limitations Info  Sight, Hearing, Speech Sight Info: Adequate Hearing Info: Adequate Speech Info: Adequate    SPECIAL CARE FACTORS FREQUENCY                       Contractures Contractures Info: Not present    Additional Factors Info  Psychotropic   Allergies Info: Penicillins Psychotropic Info: Lexapro, Desyrel, Seroquel         Current Medications (04/06/2017):  This is the current hospital active medication list Current Facility-Administered Medications  Medication Dose Route Frequency Provider Last Rate Last Dose  . acetaminophen (TYLENOL) tablet 650 mg  650 mg Oral Q6H PRN Elgergawy, Leana Roe, MD       Or  . acetaminophen (TYLENOL) suppository 650 mg  650 mg Rectal Q6H PRN Elgergawy, Dawood S, MD      . albuterol (PROVENTIL) (2.5 MG/3ML) 0.083% nebulizer solution 2.5 mg  2.5 mg Nebulization Q1H PRN Elgergawy, Leana Roe, MD      . alum & mag hydroxide-simeth (MAALOX/MYLANTA) 200-200-20 MG/5ML suspension 30 mL  30 mL Oral Q4H PRN Elgergawy, Leana Roe, MD   30 mL at 04/04/17 1854  . azithromycin (ZITHROMAX) tablet 500 mg  500 mg Oral Daily Philip Aspen, Limmie Patricia, MD   500 mg at 04/06/17 1054  . cefTRIAXone (ROCEPHIN) 1 g in dextrose 5 % 50 mL IVPB  1 g Intravenous Q24H Elgergawy, Leana Roe, MD   Stopped at 04/06/17 1017  . clonazePAM (KLONOPIN) tablet 1 mg  1 mg Oral BID  Elgergawy, Leana Roe, MD   1 mg at 04/06/17 0947  . divalproex (DEPAKOTE ER) 24 hr tablet 2,000 mg  2,000 mg Oral QHS Elgergawy, Leana Roe, MD   2,000 mg at 04/05/17 2141  . enoxaparin (LOVENOX) injection 40 mg  40 mg Subcutaneous Q24H Elgergawy, Leana Roe, MD   40 mg at 04/03/17 1029  . escitalopram (LEXAPRO) tablet 20 mg  20 mg Oral BH-q7a Elgergawy, Leana Roe, MD   20 mg at 04/06/17 0609  . hydrOXYzine (ATARAX/VISTARIL) tablet 50 mg  50 mg Oral TID PRN Elgergawy, Leana Roe, MD   50 mg at 04/05/17 1019  . ibuprofen (ADVIL,MOTRIN) tablet 400 mg   400 mg Oral Q6H PRN Elgergawy, Leana Roe, MD   400 mg at 04/05/17 1019  . ipratropium-albuterol (DUONEB) 0.5-2.5 (3) MG/3ML nebulizer solution 3 mL  3 mL Nebulization QID Elgergawy, Leana Roe, MD   3 mL at 04/06/17 1147  . MEDLINE mouth rinse  15 mL Mouth Rinse BID Elgergawy, Leana Roe, MD   15 mL at 04/06/17 0948  . methylPREDNISolone sodium succinate (SOLU-MEDROL) 125 mg/2 mL injection 80 mg  80 mg Intravenous Q8H Elgergawy, Leana Roe, MD   80 mg at 04/06/17 0609  . mometasone-formoterol (DULERA) 200-5 MCG/ACT inhaler 2 puff  2 puff Inhalation BID Elgergawy, Leana Roe, MD   2 puff at 04/06/17 0750  . multivitamin with minerals tablet 1 tablet  1 tablet Oral Daily Elgergawy, Leana Roe, MD   1 tablet at 04/06/17 0947  . nicotine (NICODERM CQ - dosed in mg/24 hours) patch 21 mg  21 mg Transdermal Daily Elgergawy, Leana Roe, MD   21 mg at 04/06/17 0948  . ondansetron (ZOFRAN) tablet 4 mg  4 mg Oral Q6H PRN Elgergawy, Leana Roe, MD   4 mg at 04/04/17 2131   Or  . ondansetron (ZOFRAN) injection 4 mg  4 mg Intravenous Q6H PRN Elgergawy, Leana Roe, MD      . oxyCODONE-acetaminophen (PERCOCET/ROXICET) 5-325 MG per tablet 1 tablet  1 tablet Oral Q4H PRN Elgergawy, Leana Roe, MD   1 tablet at 04/06/17 0955  . pantoprazole (PROTONIX) EC tablet 40 mg  40 mg Oral Daily Elgergawy, Leana Roe, MD   40 mg at 04/06/17 0947  . traMADol (ULTRAM) tablet 50 mg  50 mg Oral Q8H PRN Elgergawy, Leana Roe, MD   50 mg at 04/03/17 1046  . traZODone (DESYREL) tablet 150 mg  150 mg Oral QHS Elgergawy, Leana Roe, MD         Discharge Medications:  TAKE these medications          albuterol 108 (90 Base) MCG/ACT inhaler Commonly known as:  PROVENTIL HFA;VENTOLIN HFA Inhale 2 puffs into the lungs every 4 (four) hours as needed for wheezing or shortness of breath.   amLODipine 10 MG tablet Commonly known as:  NORVASC Take 10 mg by mouth daily.   cyclobenzaprine 10 MG tablet Commonly known as:  FLEXERIL Take 10 mg by mouth every 8  (eight) hours as needed for muscle spasms.   disulfiram 250 MG tablet Commonly known as:  ANTABUSE Take 250 mg by mouth daily.   divalproex 500 MG 24 hr tablet Commonly known as:  DEPAKOTE ER Take 4 tablets (2,000 mg total) by mouth at bedtime.   escitalopram 20 MG tablet Commonly known as:  LEXAPRO Take 20 mg by mouth every morning.   ferrous sulfate 325 (65 FE) MG tablet Take 1 tablet (325 mg total) by mouth 2 (  two) times daily with a meal.   Fluticasone-Salmeterol 250-50 MCG/DOSE Aepb Commonly known as:  ADVAIR Inhale 1 puff into the lungs 2 (two) times daily.   gabapentin 300 MG capsule Commonly known as:  NEURONTIN Take 300 mg by mouth 3 (three) times daily.   hydrOXYzine 50 MG tablet Commonly known as:  ATARAX/VISTARIL Take 100 mg by mouth 3 (three) times daily.   ibuprofen 800 MG tablet Commonly known as:  ADVIL,MOTRIN Take 800 mg by mouth 3 (three) times daily as needed for moderate pain.   levofloxacin 500 MG tablet Commonly known as:  LEVAQUIN Take 1 tablet (500 mg total) by mouth daily.   loperamide 2 MG capsule Commonly known as:  IMODIUM Take 4 mg by mouth daily as needed for diarrhea or loose stools.   Melatonin 5 MG Tabs Take 10 mg by mouth at bedtime.   predniSONE 10 MG tablet Commonly known as:  DELTASONE Take 1 tablet (10 mg total) by mouth daily with breakfast. Take 6 tablets today and then decrease by 1 tablet daily until none are left.   primidone 250 MG tablet Commonly known as:  MYSOLINE Take 250 mg by mouth 3 (three) times daily.   QUEtiapine 200 MG tablet Commonly known as:  SEROQUEL Take 200 mg by mouth at bedtime.   THEREMS-M Tabs Take 1 tablet by mouth daily.   traZODone 150 MG tablet Commonly known as:  DESYREL Take 300 mg by mouth 2 (two) times daily.              Relevant Imaging Results:  Relevant Lab Results:   Additional Information [id number]   Sameer Teeple, [name], LCSW

## 2017-04-06 NOTE — Care Management (Signed)
Patient discharging back to group home. Oxygen tank delivered to room yesterday. No other CM needs.

## 2017-04-06 NOTE — Care Management Important Message (Signed)
Important Message  Patient Details  Name: Shane HolmsWyman Dale Beiser MRN: [id number] Date of Birth: 11-22-1975   Medicare Important Message Given:  Yes    Phylliss Strege, Chrystine OilerSharley Diane, RN 04/06/2017, 2:21 PM

## 2017-04-22 ENCOUNTER — Emergency Department (HOSPITAL_COMMUNITY)
Admission: EM | Admit: 2017-04-22 | Discharge: 2017-04-22 | Disposition: A | Discharge status: Home / Self Care | Payer: Medicare Other | Attending: Emergency Medicine | Admitting: Emergency Medicine

## 2017-04-22 ENCOUNTER — Emergency Department (HOSPITAL_COMMUNITY): Payer: Medicare Other

## 2017-04-22 ENCOUNTER — Encounter (HOSPITAL_COMMUNITY): Payer: Self-pay | Admitting: Emergency Medicine

## 2017-04-22 DIAGNOSIS — L03115 Cellulitis of right lower limb: Secondary | ICD-10-CM | POA: Diagnosis not present

## 2017-04-22 DIAGNOSIS — R0602 Shortness of breath: Secondary | ICD-10-CM

## 2017-04-22 DIAGNOSIS — R600 Localized edema: Secondary | ICD-10-CM | POA: Insufficient documentation

## 2017-04-22 DIAGNOSIS — J449 Chronic obstructive pulmonary disease, unspecified: Secondary | ICD-10-CM | POA: Diagnosis not present

## 2017-04-22 DIAGNOSIS — J90 Pleural effusion, not elsewhere classified: Secondary | ICD-10-CM | POA: Diagnosis not present

## 2017-04-22 DIAGNOSIS — Z79899 Other long term (current) drug therapy: Secondary | ICD-10-CM | POA: Insufficient documentation

## 2017-04-22 DIAGNOSIS — I10 Essential (primary) hypertension: Secondary | ICD-10-CM | POA: Insufficient documentation

## 2017-04-22 DIAGNOSIS — F17210 Nicotine dependence, cigarettes, uncomplicated: Secondary | ICD-10-CM | POA: Insufficient documentation

## 2017-04-22 DIAGNOSIS — J45909 Unspecified asthma, uncomplicated: Secondary | ICD-10-CM | POA: Diagnosis not present

## 2017-04-22 DIAGNOSIS — R609 Edema, unspecified: Secondary | ICD-10-CM

## 2017-04-22 DIAGNOSIS — M7989 Other specified soft tissue disorders: Secondary | ICD-10-CM | POA: Diagnosis present

## 2017-04-22 DIAGNOSIS — L03116 Cellulitis of left lower limb: Secondary | ICD-10-CM | POA: Diagnosis not present

## 2017-04-22 HISTORY — DX: Disorder of kidney and ureter, unspecified: N28.9

## 2017-04-22 HISTORY — DX: Encephalopathy, unspecified: G93.40

## 2017-04-22 LAB — RAPID URINE DRUG SCREEN, HOSP PERFORMED
AMPHETAMINES: NOT DETECTED
Barbiturates: POSITIVE — AB
Benzodiazepines: NOT DETECTED
Cocaine: NOT DETECTED
OPIATES: NOT DETECTED
Tetrahydrocannabinol: NOT DETECTED

## 2017-04-22 LAB — URINALYSIS, ROUTINE W REFLEX MICROSCOPIC
Bilirubin Urine: NEGATIVE
Glucose, UA: NEGATIVE mg/dL
Hgb urine dipstick: NEGATIVE
Ketones, ur: NEGATIVE mg/dL
Leukocytes, UA: NEGATIVE
Nitrite: NEGATIVE
Protein, ur: NEGATIVE mg/dL
Specific Gravity, Urine: 1.009 (ref 1.005–1.030)
pH: 6 (ref 5.0–8.0)

## 2017-04-22 LAB — COMPREHENSIVE METABOLIC PANEL
ALBUMIN: 3.3 g/dL — AB (ref 3.5–5.0)
ALK PHOS: 56 U/L (ref 38–126)
ALT: 44 U/L (ref 17–63)
ANION GAP: 9 (ref 5–15)
AST: 72 U/L — ABNORMAL HIGH (ref 15–41)
BUN: 7 mg/dL (ref 6–20)
CALCIUM: 8.8 mg/dL — AB (ref 8.9–10.3)
CO2: 30 mmol/L (ref 22–32)
Chloride: 101 mmol/L (ref 101–111)
Creatinine, Ser: 0.85 mg/dL (ref 0.61–1.24)
Glucose, Bld: 98 mg/dL (ref 65–99)
Potassium: 3.2 mmol/L — ABNORMAL LOW (ref 3.5–5.1)
Sodium: 140 mmol/L (ref 135–145)
Total Bilirubin: 0.4 mg/dL (ref 0.3–1.2)
Total Protein: 6.6 g/dL (ref 6.5–8.1)

## 2017-04-22 LAB — CBC WITH DIFFERENTIAL/PLATELET
Basophils Absolute: 0 10*3/uL (ref 0.0–0.1)
Basophils Relative: 0 %
Eosinophils Absolute: 0.1 10*3/uL (ref 0.0–0.7)
Eosinophils Relative: 2 %
HEMATOCRIT: 34.7 % — AB (ref 39.0–52.0)
HEMOGLOBIN: 12 g/dL — AB (ref 13.0–17.0)
LYMPHS ABS: 1.4 10*3/uL (ref 0.7–4.0)
Lymphocytes Relative: 24 %
MCH: 31.8 pg (ref 26.0–34.0)
MCHC: 34.6 g/dL (ref 30.0–36.0)
MCV: 92 fL (ref 78.0–100.0)
Monocytes Absolute: 0.9 10*3/uL (ref 0.1–1.0)
Monocytes Relative: 15 %
NEUTROS ABS: 3.6 10*3/uL (ref 1.7–7.7)
NEUTROS PCT: 59 %
Platelets: 276 10*3/uL (ref 150–400)
RBC: 3.77 MIL/uL — ABNORMAL LOW (ref 4.22–5.81)
RDW: 14.4 % (ref 11.5–15.5)
WBC: 6.1 10*3/uL (ref 4.0–10.5)

## 2017-04-22 LAB — BLOOD GAS, ARTERIAL
Acid-Base Excess: 4.2 mmol/L — ABNORMAL HIGH (ref 0.0–2.0)
Bicarbonate: 28.2 mmol/L — ABNORMAL HIGH (ref 20.0–28.0)
Drawn by: 234301
FIO2: 21
O2 Saturation: 97.1 %
Patient temperature: 37
pCO2 arterial: 38.8 mmHg (ref 32.0–48.0)
pH, Arterial: 7.469 — ABNORMAL HIGH (ref 7.350–7.450)
pO2, Arterial: 89.1 mmHg (ref 83.0–108.0)

## 2017-04-22 LAB — BRAIN NATRIURETIC PEPTIDE: B Natriuretic Peptide: 39 pg/mL (ref 0.0–100.0)

## 2017-04-22 LAB — TROPONIN I: Troponin I: <0.03 ng/mL (ref <0.03)

## 2017-04-22 LAB — ETHANOL: Alcohol, Ethyl (B): 5 mg/dL — ABNORMAL HIGH (ref <5)

## 2017-04-22 LAB — CBG MONITORING, ED: Glucose-Capillary: 93 mg/dL (ref 65–99)

## 2017-04-22 LAB — D-DIMER, QUANTITATIVE (NOT AT ARMC): D DIMER QUANT: 1 ug{FEU}/mL — AB (ref 0.00–0.50)

## 2017-04-22 MED ORDER — FUROSEMIDE 20 MG PO TABS: 20.0000 mg | ORAL_TABLET | Freq: Every day | ORAL | 0 refills | Status: DC

## 2017-04-22 MED ORDER — MORPHINE SULFATE (PF) 4 MG/ML IV SOLN: 4.0000 mg | Freq: Once | INTRAVENOUS | Status: DC

## 2017-04-22 MED ORDER — ENOXAPARIN SODIUM 100 MG/ML ~~LOC~~ SOLN
1.0000 mg/kg | Freq: Once | SUBCUTANEOUS | Status: AC
  Administered 2017-04-22: 90 mg via SUBCUTANEOUS
  Filled 2017-04-22: qty 1

## 2017-04-22 MED ORDER — IOPAMIDOL (ISOVUE-370) INJECTION 76%
100.0000 mL | Freq: Once | INTRAVENOUS | Status: AC | PRN
  Administered 2017-04-22: 100 mL via INTRAVENOUS

## 2017-04-22 MED ORDER — VANCOMYCIN HCL IN DEXTROSE 1-5 GM/200ML-% IV SOLN
1000.0000 mg | Freq: Once | INTRAVENOUS | Status: AC
  Administered 2017-04-22: 1000 mg via INTRAVENOUS
  Filled 2017-04-22: qty 200

## 2017-04-22 MED ORDER — SULFAMETHOXAZOLE-TRIMETHOPRIM 800-160 MG PO TABS
1.0000 | ORAL_TABLET | Freq: Once | ORAL | Status: AC
  Administered 2017-04-22: 1 via ORAL
  Filled 2017-04-22: qty 1

## 2017-04-22 MED ORDER — SULFAMETHOXAZOLE-TRIMETHOPRIM 800-160 MG PO TABS: 1.0000 | ORAL_TABLET | Freq: Two times a day (BID) | ORAL | 0 refills | Status: AC

## 2017-04-22 MED ORDER — ONDANSETRON HCL 4 MG/2ML IJ SOLN
4.0000 mg | Freq: Once | INTRAMUSCULAR | Status: AC
  Administered 2017-04-22: 4 mg via INTRAVENOUS
  Filled 2017-04-22: qty 2

## 2017-04-22 MED ORDER — FUROSEMIDE 10 MG/ML IJ SOLN
40.0000 mg | Freq: Once | INTRAMUSCULAR | Status: AC
  Administered 2017-04-22: 40 mg via INTRAVENOUS
  Filled 2017-04-22: qty 4

## 2017-04-22 NOTE — ED Notes (Addendum)
Call Larey DaysNancy Parson, representative from group home when pt is ready for d/c. [phone]

## 2017-04-22 NOTE — ED Notes (Signed)
Walked into room at this time to do hourly rounding, pt had been drinking a soda. Pt notified he is not to drink until all testing results are received.

## 2017-04-22 NOTE — ED Notes (Signed)
Harriett SineNancy from facility notified of d/c instructions, prescriptions, and to return to [facility] for US tomorrow. Will be sending someone for transportation.

## 2017-04-22 NOTE — ED Provider Notes (Signed)
[facility] DEPT Provider Note   CSN: [id number] Arrival date & time: 04/22/17  1106     History   Chief Complaint Chief Complaint  Patient presents with  . Leg Swelling    HPI Ranjit Ashurst is a 41 y.o. male.  Pt presents to the ED today with bilateral leg swelling and redness with pain. Pt said it has been going on for a few days.  The pt said that he does not have cp.  He has occ sob, but that is chronic for him.  The pt said that he has not had transportation to get here. Pt was admitted from 5/14-17 for a COPD exacerbation.      Past Medical History:  Diagnosis Date  . Anxiety   . Asthma   . Depression   . Encephalopathy   . GERD (gastroesophageal reflux disease)   . Hypertension   . Hyponatremia 06/2016  . Renal disorder    kidney injury  . Seizures [facility])     Patient Active Problem List   Diagnosis Date Noted  . Acute on chronic respiratory failure with hypoxia (HCC) 04/05/2017  . COPD exacerbation (HCC) 04/03/2017  . Musculoskeletal pain 06/28/2016  . Left flank pain   . Hyponatremia 06/27/2016  . 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 Pleural effusion, right 12/31/2015  . GERD (gastroesophageal reflux disease) 11/11/2015  . Borderline personality disorder 11/11/2015  . Alcohol use disorder, severe, in controlled environment (HCC) 11/11/2015  . 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 Tobacco abuse 11/05/2013  . HTN (hypertension) 11/05/2013    Past Surgical History:  Procedure Laterality Date  . FINGER SURGERY Left    5th digit  . PLEURAL EFFUSION DRAINAGE Right 01/02/2016   Procedure: DRAINAGE OF PLEURAL EFFUSION;  Surgeon: Loreli Slot, MD;  Location: [facility] OR;  Service: Thoracic;  Laterality: Right;  Marland Kitchen VIDEO ASSISTED THORACOSCOPY (VATS)/DECORTICATION Right 01/02/2016   Procedure: VIDEO ASSISTED THORACOSCOPY  (VATS)/DECORTICATION;  Surgeon: Loreli Slot, MD;  Location: [facility] OR;  Service: Thoracic;  Laterality: Right;  Marland Kitchen VIDEO BRONCHOSCOPY N/A 01/02/2016   Procedure: VIDEO BRONCHOSCOPY;  Surgeon: Loreli Slot, MD;  Location: [facility] OR;  Service: Thoracic;  Laterality: N/A;       Home Medications    Prior to Admission medications   Medication Sig Start Date End Date Taking? Authorizing Provider  albuterol (PROVENTIL HFA;VENTOLIN HFA) 108 (90 Base) MCG/ACT inhaler Inhale 2 puffs into the lungs every 4 (four) hours as needed for wheezing or shortness of breath.    [provider]  amLODipine (NORVASC) 10 MG tablet Take 10 mg by mouth daily.    [provider]  cyclobenzaprine (FLEXERIL) 10 MG tablet Take 10 mg by mouth every 8 (eight) hours as needed for muscle spasms.    [provider]  disulfiram (ANTABUSE) 250 MG tablet Take 250 mg by mouth daily.    [provider]  divalproex (DEPAKOTE ER) 500 MG 24 hr tablet Take 4 tablets (2,000 mg total) by mouth at bedtime. 11/13/15   Jimmy Footman, MD  escitalopram (LEXAPRO) 20 MG tablet Take 20 mg by mouth every morning.    [provider]  ferrous sulfate 325 (65 FE) MG tablet Take 1 tablet (325 mg total) by mouth 2 (two) times daily with a meal. 01/11/16   Regalado, Belkys A, MD  Fluticasone-Salmeterol (ADVAIR) 250-50 MCG/DOSE AEPB Inhale 1 puff into  the lungs 2 (two) times daily.    [provider]  furosemide (LASIX) 20 MG tablet Take 1 tablet (20 mg total) by mouth daily. 04/22/17   Jacalyn Lefevre, MD  gabapentin (NEURONTIN) 300 MG capsule Take 300 mg by mouth 3 (three) times daily.    [provider]  hydrOXYzine (ATARAX/VISTARIL) 50 MG tablet Take 100 mg by mouth 3 (three) times daily.     [provider]  ibuprofen (ADVIL,MOTRIN) 800 MG tablet Take 800 mg by mouth 3 (three) times daily as needed for moderate pain.    [provider]  loperamide  (IMODIUM) 2 MG capsule Take 4 mg by mouth daily as needed for diarrhea or loose stools.     [provider]  Melatonin 5 MG TABS Take 10 mg by mouth at bedtime.    [provider]  Multiple Vitamins-Minerals (THEREMS-M) TABS Take 1 tablet by mouth daily.    [provider]  predniSONE (DELTASONE) 10 MG tablet Take 1 tablet (10 mg total) by mouth daily with breakfast. Take 6 tablets today and then decrease by 1 tablet daily until none are left. 04/06/17   Philip Aspen, Limmie Patricia, MD  primidone (MYSOLINE) 250 MG tablet Take 250 mg by mouth 3 (three) times daily.    [provider]  QUEtiapine (SEROQUEL) 200 MG tablet Take 200 mg by mouth at bedtime.    [provider]  sulfamethoxazole-trimethoprim (BACTRIM DS,SEPTRA DS) 800-160 MG tablet Take 1 tablet by mouth 2 (two) times daily. 04/22/17 04/29/17  Jacalyn Lefevre, MD  traZODone (DESYREL) 150 MG tablet Take 300 mg by mouth 2 (two) times daily.     [provider]    Family History Family History  Problem Relation Age of Onset  . CAD Mother   . Diabetes Mellitus II Father   . Prostate cancer Paternal Grandfather     Social History Social History  Substance Use Topics  . Smoking status: Current Every Day Smoker    Packs/day: 0.50    Years: 20.00    Types: Cigarettes  . Smokeless tobacco: Never Used  . Alcohol use No     Comment: pt states he no longer drinks     Allergies   Penicillins   Review of Systems Review of Systems  Musculoskeletal:       Leg swelling  All other systems reviewed and are negative.    Physical Exam Updated Vital Signs BP 104/67   Pulse (!) 58   Temp 97.8 F (36.6 C) (Oral)   Resp 15   Ht 5\' 11"  (1.803 m)   Wt 90.7 kg (200 lb)   SpO2 97%   BMI 27.89 kg/m   Physical Exam  Constitutional: He is oriented to person, place, and time. He appears well-developed and well-nourished.  HENT:  Head: Normocephalic and atraumatic.  Right Ear:  External ear normal.  Left Ear: External ear normal.  Nose: Nose normal.  Mouth/Throat: Oropharynx is clear and moist.  Eyes: Conjunctivae and EOM are normal. Pupils are equal, round, and reactive to light.  Neck: Normal range of motion. Neck supple.  Cardiovascular: Normal rate, regular rhythm, normal heart sounds and intact distal pulses.   Pulmonary/Chest: Effort normal and breath sounds normal.  Abdominal: Soft. Bowel sounds are normal.  Musculoskeletal:  Bilateral le swelling (2-3 plus) with redness  Neurological: He is alert and oriented to person, place, and time.  Psychiatric: He has a normal mood and affect.  Nursing note and vitals reviewed.  ED Treatments / Results  Labs (all labs ordered are listed, but only abnormal results are displayed) Labs Reviewed  CBC WITH DIFFERENTIAL/PLATELET - Abnormal; Notable for the following:       Result Value   RBC 3.77 (*)    Hemoglobin 12.0 (*)    HCT 34.7 (*)    All other components within normal limits  COMPREHENSIVE METABOLIC PANEL - Abnormal; Notable for the following:    Potassium 3.2 (*)    Calcium 8.8 (*)    Albumin 3.3 (*)    AST 72 (*)    All other components within normal limits  D-DIMER, QUANTITATIVE (NOT AT [facility]) - Abnormal; Notable for the following:    D-Dimer, Quant 1.00 (*)    All other components within normal limits  RAPID URINE DRUG SCREEN, HOSP PERFORMED - Abnormal; Notable for the following:    Barbiturates POSITIVE (*)    All other components within normal limits  ETHANOL - Abnormal; Notable for the following:    Alcohol, Ethyl (B) 5 (*)    All other components within normal limits  BLOOD GAS, ARTERIAL - Abnormal; Notable for the following:    pH, Arterial 7.469 (*)    Bicarbonate 28.2 (*)    Acid-Base Excess 4.2 (*)    All other components within normal limits  BRAIN NATRIURETIC PEPTIDE  TROPONIN I  URINALYSIS, ROUTINE W REFLEX MICROSCOPIC  CBG MONITORING, ED    EKG  EKG  Interpretation  Date/Time:  Saturday April 22 2017 13:51:22 EDT Ventricular Rate:  55 PR Interval:    QRS Duration: 101 QT Interval:  471 QTC Calculation: 451 R Axis:   79 Text Interpretation:  Sinus rhythm RSR' in V1 or V2, right VCD or RVH rate is slower compared to prior ekg Confirmed by Jacalyn Lefevre [phone]) on 04/22/2017 2:50:04 PM       Radiology Ct Angio Chest Pe W Or Wo Contrast  Result Date: 04/22/2017 CLINICAL DATA:  Patient with bilateral lower extremity swelling and shortness of breath. EXAM: CT ANGIOGRAPHY CHEST WITH CONTRAST TECHNIQUE: Multidetector CT imaging of the chest was performed using the standard protocol during bolus administration of intravenous contrast. Multiplanar CT image reconstructions and MIPs were obtained to evaluate the vascular anatomy. CONTRAST:  70 cc Isovue 370 COMPARISON:  Chest radiograph 04/22/2017 FINDINGS: Cardiovascular: Normal heart size. Coronary arterial vascular calcifications. Aorta and main pulmonary artery are normal in caliber. No filling defects identified within the pulmonary arterial system to suggest acute pulmonary embolus. Mediastinum/Nodes: No enlarged axillary, mediastinal or hilar lymphadenopathy. Small hiatal hernia. Lungs/Pleura: Central airways are patent. Subpleural peripheral consolidation within the lingula, left lower and right lower lobes. Small bilateral pleural effusions, left-greater-than-right. No pneumothorax. Upper Abdomen: Fatty deposition adjacent to the falciform ligament. Gallbladder is unremarkable. Musculoskeletal: Thoracic spine degenerative changes. No aggressive or acute appearing osseous lesions. Healing anterior left rib fractures. Review of the MIP images confirms the above findings. IMPRESSION: No evidence for pulmonary embolus. Peripheral subpleural consolidation within the lingula, left lower lobe and right lower lobes. Findings may represent multifocal pneumonia in the appropriate clinical setting. Recommend  follow-up chest radiograph in 6- 8 weeks to assess for interval resolution. Small bilateral pleural effusions, left-greater-than-right. Electronically Signed   By: Annia Belt M.D.   On: 04/22/2017 16:04   Dg Chest Port 1 View  Result Date: 04/22/2017 CLINICAL DATA:  Patient with shortness of breath and lethargy. EXAM: PORTABLE CHEST 1 VIEW COMPARISON:  Chest radiograph 04/03/2017 FINDINGS: Monitoring leads overlie the patient. Stable  cardiac and mediastinal contours. Bilateral perihilar interstitial pulmonary opacities and pulmonary vascular redistribution. Small bilateral pleural effusions. No pneumothorax. IMPRESSION: Perihilar interstitial opacities suggestive of mild interstitial edema. Small bilateral pleural effusions, left-greater-than-right. Electronically Signed   By: Annia Beltrew  Davis M.D.   On: 04/22/2017 14:29    Procedures Procedures (including critical care time)  Medications Ordered in ED Medications  morphine 4 MG/ML injection 4 mg (0 mg Intravenous Hold 04/22/17 1441)  furosemide (LASIX) injection 40 mg (40 mg Intravenous Given 04/22/17 1440)  ondansetron (ZOFRAN) injection 4 mg (4 mg Intravenous Given 04/22/17 1440)  vancomycin (VANCOCIN) IVPB 1000 mg/200 mL premix (0 mg Intravenous Stopped 04/22/17 1540)  iopamidol (ISOVUE-370) 76 % injection 100 mL (100 mLs Intravenous Contrast Given 04/22/17 1530)  sulfamethoxazole-trimethoprim (BACTRIM DS,SEPTRA DS) 800-160 MG per tablet 1 tablet (1 tablet Oral Given 04/22/17 1707)  enoxaparin (LOVENOX) injection 90 mg (90 mg Subcutaneous Given 04/22/17 1707)     Initial Impression / Assessment and Plan / ED Course  I have reviewed the triage vital signs and the nursing notes.  Pertinent labs & imaging results that were available during my care of the patient were reviewed by me and considered in my medical decision making (see chart for details).  Swelling in legs likely due to peripheral edema and not DVT, but he is at risk for DVT.  I gave him a dose  of lovenox here and ordered an US to be done tomorrow morning.  Unfortunately, we are not able to get this done here today.    The pt does have cellulitis and will be treated with bactrim.  Clinically, pt does not have pna.  He is afebrile and oxygenating well without cough.  I ordered an ECHO to be done for pt as he does have evidence of some CHF with peripheral edema.  Pt will be started on lasix.  He knows to return if worse.  Final Clinical Impressions(s) / ED Diagnoses   Final diagnoses:  Peripheral edema  Pleural effusion  Cellulitis of right lower extremity  Cellulitis of left lower extremity    New Prescriptions New Prescriptions   FUROSEMIDE (LASIX) 20 MG TABLET    Take 1 tablet (20 mg total) by mouth daily.   SULFAMETHOXAZOLE-TRIMETHOPRIM (BACTRIM DS,SEPTRA DS) 800-160 MG TABLET    Take 1 tablet by mouth 2 (two) times daily.     Jacalyn LefevreHaviland, Yakira Duquette, MD 04/22/17 1729

## 2017-04-22 NOTE — ED Triage Notes (Signed)
Pt reports swelling and redness of bilateral lower extremities for the past 3 days.

## 2017-04-22 NOTE — ED Notes (Signed)
Pt lethargic upon this RN walking into room. Responding to loud verbal stimuli. Denies ETOH or drug use. Falling asleep mid sentence. Once aroused pt is able to correctly state month, year, name, dob. MD Haviland notified. Per MD hold morphine.

## 2017-04-22 NOTE — Discharge Instructions (Signed)
Come back tomorrow for an ultrasound to check for blood clots in legs.  An ECHO (ultrasound of your heart) has also been ordered.

## 2017-04-23 ENCOUNTER — Other Ambulatory Visit (HOSPITAL_COMMUNITY): Payer: Self-pay | Admitting: Emergency Medicine

## 2017-04-23 ENCOUNTER — Other Ambulatory Visit (HOSPITAL_COMMUNITY): Payer: Medicare Other

## 2017-04-23 ENCOUNTER — Ambulatory Visit (HOSPITAL_COMMUNITY): Admission: RE | Admit: 2017-04-23 | Payer: Medicare Other | Source: Ambulatory Visit

## 2017-04-23 DIAGNOSIS — R609 Edema, unspecified: Secondary | ICD-10-CM

## 2017-05-02 ENCOUNTER — Inpatient Hospital Stay (HOSPITAL_COMMUNITY)
Admission: EM | Admit: 2017-05-02 | Discharge: 2017-05-07 | DRG: 071 | Disposition: A | Discharge status: Home / Self Care | Payer: Medicare Other | Attending: Internal Medicine | Admitting: Internal Medicine

## 2017-05-02 ENCOUNTER — Encounter (HOSPITAL_COMMUNITY): Payer: Self-pay | Admitting: Emergency Medicine

## 2017-05-02 ENCOUNTER — Emergency Department (HOSPITAL_COMMUNITY): Payer: Medicare Other

## 2017-05-02 DIAGNOSIS — R509 Fever, unspecified: Secondary | ICD-10-CM

## 2017-05-02 DIAGNOSIS — R600 Localized edema: Secondary | ICD-10-CM

## 2017-05-02 DIAGNOSIS — Z880 Allergy status to penicillin: Secondary | ICD-10-CM

## 2017-05-02 DIAGNOSIS — I10 Essential (primary) hypertension: Secondary | ICD-10-CM | POA: Diagnosis present

## 2017-05-02 DIAGNOSIS — R400 Somnolence: Secondary | ICD-10-CM | POA: Diagnosis not present

## 2017-05-02 DIAGNOSIS — R4182 Altered mental status, unspecified: Secondary | ICD-10-CM | POA: Diagnosis present

## 2017-05-02 DIAGNOSIS — G9341 Metabolic encephalopathy: Secondary | ICD-10-CM | POA: Diagnosis not present

## 2017-05-02 DIAGNOSIS — F603 Borderline personality disorder: Secondary | ICD-10-CM | POA: Diagnosis present

## 2017-05-02 DIAGNOSIS — W19XXXA Unspecified fall, initial encounter: Secondary | ICD-10-CM | POA: Diagnosis present

## 2017-05-02 DIAGNOSIS — F1020 Alcohol dependence, uncomplicated: Secondary | ICD-10-CM | POA: Diagnosis present

## 2017-05-02 DIAGNOSIS — F329 Major depressive disorder, single episode, unspecified: Secondary | ICD-10-CM | POA: Diagnosis present

## 2017-05-02 DIAGNOSIS — M545 Low back pain: Secondary | ICD-10-CM | POA: Diagnosis present

## 2017-05-02 DIAGNOSIS — F419 Anxiety disorder, unspecified: Secondary | ICD-10-CM | POA: Diagnosis present

## 2017-05-02 DIAGNOSIS — F17210 Nicotine dependence, cigarettes, uncomplicated: Secondary | ICD-10-CM | POA: Diagnosis present

## 2017-05-02 DIAGNOSIS — Z8249 Family history of ischemic heart disease and other diseases of the circulatory system: Secondary | ICD-10-CM

## 2017-05-02 DIAGNOSIS — R680 Hypothermia, not associated with low environmental temperature: Secondary | ICD-10-CM | POA: Diagnosis present

## 2017-05-02 DIAGNOSIS — G9340 Encephalopathy, unspecified: Secondary | ICD-10-CM | POA: Diagnosis present

## 2017-05-02 DIAGNOSIS — Z79899 Other long term (current) drug therapy: Secondary | ICD-10-CM

## 2017-05-02 DIAGNOSIS — G40909 Epilepsy, unspecified, not intractable, without status epilepticus: Secondary | ICD-10-CM | POA: Diagnosis present

## 2017-05-02 DIAGNOSIS — J449 Chronic obstructive pulmonary disease, unspecified: Secondary | ICD-10-CM | POA: Diagnosis present

## 2017-05-02 DIAGNOSIS — J9611 Chronic respiratory failure with hypoxia: Secondary | ICD-10-CM | POA: Diagnosis present

## 2017-05-02 DIAGNOSIS — Z9981 Dependence on supplemental oxygen: Secondary | ICD-10-CM

## 2017-05-02 DIAGNOSIS — K219 Gastro-esophageal reflux disease without esophagitis: Secondary | ICD-10-CM | POA: Diagnosis present

## 2017-05-02 LAB — BLOOD GAS, ARTERIAL
Acid-Base Excess: 0.3 mmol/L (ref 0.0–2.0)
Bicarbonate: 23.7 mmol/L (ref 20.0–28.0)
Drawn by: 22223
FIO2: 21
O2 SAT: 95.1 %
PCO2 ART: 42.5 mmHg (ref 32.0–48.0)
Patient temperature: 34.3
pH, Arterial: 7.368 (ref 7.350–7.450)
pO2, Arterial: 69.4 mmHg — ABNORMAL LOW (ref 83.0–108.0)

## 2017-05-02 LAB — CBC WITH DIFFERENTIAL/PLATELET
BASOS PCT: 1 %
Basophils Absolute: 0 10*3/uL (ref 0.0–0.1)
Eosinophils Absolute: 0.3 10*3/uL (ref 0.0–0.7)
Eosinophils Relative: 9 %
HEMATOCRIT: 29 % — AB (ref 39.0–52.0)
HEMOGLOBIN: 10.2 g/dL — AB (ref 13.0–17.0)
LYMPHS PCT: 33 %
Lymphs Abs: 1.2 10*3/uL (ref 0.7–4.0)
MCH: 32.7 pg (ref 26.0–34.0)
MCHC: 35.2 g/dL (ref 30.0–36.0)
MCV: 92.9 fL (ref 78.0–100.0)
MONO ABS: 0.6 10*3/uL (ref 0.1–1.0)
Monocytes Relative: 17 %
NEUTROS ABS: 1.5 10*3/uL — AB (ref 1.7–7.7)
NEUTROS PCT: 40 %
Platelets: 194 10*3/uL (ref 150–400)
RBC: 3.12 MIL/uL — AB (ref 4.22–5.81)
RDW: 15 % (ref 11.5–15.5)
WBC: 3.6 10*3/uL — AB (ref 4.0–10.5)

## 2017-05-02 LAB — URINALYSIS, ROUTINE W REFLEX MICROSCOPIC
Bilirubin Urine: NEGATIVE
GLUCOSE, UA: NEGATIVE mg/dL
HGB URINE DIPSTICK: NEGATIVE
KETONES UR: NEGATIVE mg/dL
Leukocytes, UA: NEGATIVE
Nitrite: NEGATIVE
PH: 6 (ref 5.0–8.0)
Protein, ur: NEGATIVE mg/dL
Specific Gravity, Urine: 1.011 (ref 1.005–1.030)

## 2017-05-02 LAB — COMPREHENSIVE METABOLIC PANEL
ALBUMIN: 3.1 g/dL — AB (ref 3.5–5.0)
ALT: 32 U/L (ref 17–63)
AST: 41 U/L (ref 15–41)
Alkaline Phosphatase: 51 U/L (ref 38–126)
Anion gap: 9 (ref 5–15)
BILIRUBIN TOTAL: 0.3 mg/dL (ref 0.3–1.2)
BUN: 10 mg/dL (ref 6–20)
CO2: 26 mmol/L (ref 22–32)
CREATININE: 0.99 mg/dL (ref 0.61–1.24)
Calcium: 8.2 mg/dL — ABNORMAL LOW (ref 8.9–10.3)
Chloride: 103 mmol/L (ref 101–111)
GFR calc Af Amer: >60 mL/min (ref >60)
GLUCOSE: 125 mg/dL — AB (ref 65–99)
Potassium: 3.2 mmol/L — ABNORMAL LOW (ref 3.5–5.1)
Sodium: 138 mmol/L (ref 135–145)
TOTAL PROTEIN: 5.7 g/dL — AB (ref 6.5–8.1)

## 2017-05-02 LAB — RAPID URINE DRUG SCREEN, HOSP PERFORMED
AMPHETAMINES: NOT DETECTED
BARBITURATES: POSITIVE — AB
BENZODIAZEPINES: NOT DETECTED
Cocaine: NOT DETECTED
Opiates: NOT DETECTED
Tetrahydrocannabinol: NOT DETECTED

## 2017-05-02 LAB — ETHANOL: Alcohol, Ethyl (B): <5 mg/dL (ref <5)

## 2017-05-02 LAB — TROPONIN I

## 2017-05-02 LAB — LACTIC ACID, PLASMA: LACTIC ACID, VENOUS: 0.6 mmol/L (ref 0.5–1.9)

## 2017-05-02 LAB — CK: CK TOTAL: 552 U/L — AB (ref 49–397)

## 2017-05-02 LAB — BRAIN NATRIURETIC PEPTIDE: B Natriuretic Peptide: 40 pg/mL (ref 0.0–100.0)

## 2017-05-02 LAB — VALPROIC ACID LEVEL: Valproic Acid Lvl: 72 ug/mL (ref 50.0–100.0)

## 2017-05-02 MED ORDER — NALOXONE HCL 2 MG/2ML IJ SOSY
PREFILLED_SYRINGE | INTRAMUSCULAR | Status: AC
  Filled 2017-05-02: qty 2

## 2017-05-02 MED ORDER — DEXTROSE 5 % IV SOLN: 1.0000 g | Freq: Once | INTRAVENOUS | Status: DC

## 2017-05-02 MED ORDER — PIPERACILLIN-TAZOBACTAM 3.375 G IVPB 30 MIN
3.3750 g | Freq: Once | INTRAVENOUS | Status: AC
  Administered 2017-05-02: 3.375 g via INTRAVENOUS
  Filled 2017-05-02: qty 50

## 2017-05-02 MED ORDER — NALOXONE HCL 0.4 MG/ML IJ SOLN
2.0000 mg | Freq: Once | INTRAMUSCULAR | Status: AC
  Administered 2017-05-02: 2 mg via INTRAVENOUS

## 2017-05-02 MED ORDER — SODIUM CHLORIDE 0.9 % IV BOLUS (SEPSIS)
2000.0000 mL | Freq: Once | INTRAVENOUS | Status: AC
  Administered 2017-05-02: 2000 mL via INTRAVENOUS

## 2017-05-02 MED ORDER — VANCOMYCIN HCL IN DEXTROSE 1-5 GM/200ML-% IV SOLN
1000.0000 mg | Freq: Once | INTRAVENOUS | Status: AC
  Administered 2017-05-02: 1000 mg via INTRAVENOUS
  Filled 2017-05-02: qty 200

## 2017-05-02 MED ORDER — NALOXONE HCL 2 MG/2ML IJ SOSY
2.0000 mg | PREFILLED_SYRINGE | Freq: Once | INTRAMUSCULAR | Status: AC
  Administered 2017-05-02: 2 mg via INTRAVENOUS
  Filled 2017-05-02: qty 2

## 2017-05-02 MED ORDER — SODIUM CHLORIDE 0.9 % IV BOLUS (SEPSIS)
1000.0000 mL | Freq: Once | INTRAVENOUS | Status: AC
  Administered 2017-05-02: 1000 mL via INTRAVENOUS

## 2017-05-02 NOTE — ED Provider Notes (Signed)
[facility]Y DEPT Provider Note   CSN: [id number] Arrival date & time: 05/02/17  2133     History   Chief Complaint Chief Complaint  Patient presents with  . Altered Mental Status    HPI [name] is a 41 y.o. male.    The pt was brought in from a  Group home lethargic.  He fell today   The history is provided by the EMS personnel. No language interpreter was used.  Altered Mental Status   This is a new problem. The current episode started 1 to 2 hours ago. The problem has not changed since onset.Associated symptoms include somnolence. Risk factors include illicit drug use. His past medical history is significant for seizures.    Past Medical History:  Diagnosis Date  . Anxiety   . Asthma   . Depression   . Encephalopathy   . GERD (gastroesophageal reflux disease)   . Hypertension   . Hyponatremia 06/2016  . Renal disorder    kidney injury  . Seizures [facility])     Patient Active Problem List   Diagnosis Date Noted  . Altered mental state 05/02/2017  . Acute on chronic respiratory failure with hypoxia (HCC) 04/05/2017  . COPD exacerbation (HCC) 04/03/2017  . Musculoskeletal pain 06/28/2016  . Left flank pain   . Hyponatremia 06/27/2016  . 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 Pleural effusion, right 12/31/2015  . GERD (gastroesophageal reflux disease) 11/11/2015  . Borderline personality disorder 11/11/2015  . Alcohol use disorder, severe, in controlled environment (HCC) 11/11/2015  . 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 Tobacco abuse 11/05/2013  . HTN (hypertension) 11/05/2013    Past Surgical History:  Procedure Laterality Date  . FINGER SURGERY Left    5th digit  . PLEURAL EFFUSION DRAINAGE Right 01/02/2016   Procedure: DRAINAGE OF PLEURAL EFFUSION;  Surgeon: Loreli Slot, MD;  Location: [facility] OR;  Service: Thoracic;   Laterality: Right;  Marland Kitchen VIDEO ASSISTED THORACOSCOPY (VATS)/DECORTICATION Right 01/02/2016   Procedure: VIDEO ASSISTED THORACOSCOPY (VATS)/DECORTICATION;  Surgeon: Loreli Slot, MD;  Location: [facility] OR;  Service: Thoracic;  Laterality: Right;  Marland Kitchen VIDEO BRONCHOSCOPY N/A 01/02/2016   Procedure: VIDEO BRONCHOSCOPY;  Surgeon: Loreli Slot, MD;  Location: [facility] OR;  Service: Thoracic;  Laterality: N/A;       Home Medications    Prior to Admission medications   Medication Sig Start Date End Date Taking? Authorizing Provider  albuterol (PROVENTIL HFA;VENTOLIN HFA) 108 (90 Base) MCG/ACT inhaler Inhale 2 puffs into the lungs every 4 (four) hours as needed for wheezing or shortness of breath.   Yes [provider]  amLODipine (NORVASC) 10 MG tablet Take 10 mg by mouth daily.   Yes [provider]  cyclobenzaprine (FLEXERIL) 10 MG tablet Take 10 mg by mouth every 8 (eight) hours as needed for muscle spasms.   Yes [provider]  disulfiram (ANTABUSE) 250 MG tablet Take 250 mg by mouth daily.   Yes [provider]  divalproex (DEPAKOTE ER) 500 MG 24 hr tablet Take 4 tablets (2,000 mg total) by mouth at bedtime. 11/13/15  Yes Jimmy Footman, MD  escitalopram (LEXAPRO) 20 MG tablet Take 20 mg by mouth every morning.   Yes [provider]  esomeprazole (NEXIUM) 40 MG capsule Take 40 mg by mouth daily at 12 noon.   Yes [provider]  ferrous sulfate 325 (65 FE) MG  tablet Take 1 tablet (325 mg total) by mouth 2 (two) times daily with a meal. 01/11/16  Yes Regalado, Belkys A, MD  Fluticasone-Salmeterol (ADVAIR) 250-50 MCG/DOSE AEPB Inhale 1 puff into the lungs 2 (two) times daily.   Yes [provider]  furosemide (LASIX) 20 MG tablet Take 1 tablet (20 mg total) by mouth daily. 04/22/17  Yes Jacalyn Lefevre, MD  gabapentin (NEURONTIN) 300 MG capsule Take 300 mg by mouth 3 (three) times daily.   Yes [provider]    hydrOXYzine (ATARAX/VISTARIL) 50 MG tablet Take 100 mg by mouth 3 (three) times daily.    Yes [provider]  ibuprofen (ADVIL,MOTRIN) 800 MG tablet Take 800 mg by mouth 3 (three) times daily as needed for moderate pain.   Yes [provider]  loperamide (IMODIUM) 2 MG capsule Take 4 mg by mouth daily as needed for diarrhea or loose stools.    Yes [provider]  Melatonin 5 MG TABS Take 10 mg by mouth at bedtime.   Yes [provider]  Multiple Vitamins-Minerals (THEREMS-M) TABS Take 1 tablet by mouth daily.   Yes [provider]  primidone (MYSOLINE) 250 MG tablet Take 250 mg by mouth 3 (three) times daily.   Yes [provider]  QUEtiapine (SEROQUEL) 200 MG tablet Take 200 mg by mouth at bedtime.   Yes [provider]  traZODone (DESYREL) 150 MG tablet Take 300 mg by mouth at bedtime.    Yes [provider]  predniSONE (DELTASONE) 10 MG tablet Take 1 tablet (10 mg total) by mouth daily with breakfast. Take 6 tablets today and then decrease by 1 tablet daily until none are left. Patient not taking: Reported on 05/02/2017 04/06/17   Philip Aspen, Limmie Patricia, MD  sulfamethoxazole-trimethoprim (BACTRIM DS,SEPTRA DS) 800-160 MG tablet Take 1 tablet by mouth 2 (two) times daily. 6 day course starting on 04/24/2017-COMPLETED 05/01/2017    [provider]    Family History Family History  Problem Relation Age of Onset  . CAD Mother   . Diabetes Mellitus II Father   . Prostate cancer Paternal Grandfather     Social History Social History  Substance Use Topics  . Smoking status: Current Every Day Smoker    Packs/day: 0.50    Years: 20.00    Types: Cigarettes  . Smokeless tobacco: Never Used  . Alcohol use No     Comment: pt states he no longer drinks     Allergies   Penicillins   Review of Systems Review of Systems  Unable to perform ROS: Mental status change     Physical Exam Updated Vital  Signs BP (!) 100/58   Pulse 66   Temp (!) 95 F (35 C)   Resp 12   Ht 6\' 1"  (1.854 m)   Wt 90.7 kg (200 lb)   SpO2 97%   BMI 26.39 kg/m   Physical Exam  Constitutional: He appears well-developed.  HENT:  Head: Normocephalic.  Gag reflex present.  Eyes: Conjunctivae and EOM are normal. No scleral icterus.  Neck: Neck supple. No thyromegaly present.  Cardiovascular: Normal rate and regular rhythm.  Exam reveals no gallop and no friction rub.   No murmur heard. Pulmonary/Chest: No stridor. He has no wheezes. He has no rales. He exhibits no tenderness.  Abdominal: He exhibits no distension. There is no tenderness. There is no rebound.  Musculoskeletal: Normal range of motion. He exhibits no edema.  Lymphadenopathy:    He has  no cervical adenopathy.  Neurological: He exhibits normal muscle tone. Coordination normal.  Patient very lethargic. He will respond to painful stimuli by speaking and moving both of his arms to strike at the painful area. The patient has answered some questions appropriately saying he is in the hospital  Skin: No rash noted. No erythema.  Psychiatric: He has a normal mood and affect. His behavior is normal.     ED Treatments / Results  Labs (all labs ordered are listed, but only abnormal results are displayed) Labs Reviewed  CBC WITH DIFFERENTIAL/PLATELET - Abnormal; Notable for the following:       Result Value   WBC 3.6 (*)    RBC 3.12 (*)    Hemoglobin 10.2 (*)    HCT 29.0 (*)    Neutro Abs 1.5 (*)    All other components within normal limits  COMPREHENSIVE METABOLIC PANEL - Abnormal; Notable for the following:    Potassium 3.2 (*)    Glucose, Bld 125 (*)    Calcium 8.2 (*)    Total Protein 5.7 (*)    Albumin 3.1 (*)    All other components within normal limits  RAPID URINE DRUG SCREEN, HOSP PERFORMED - Abnormal; Notable for the following:    Barbiturates POSITIVE (*)    All other components within normal limits  CK - Abnormal; Notable for  the following:    Total CK 552 (*)    All other components within normal limits  BLOOD GAS, ARTERIAL - Abnormal; Notable for the following:    pO2, Arterial 69.4 (*)    All other components within normal limits  CULTURE, BLOOD (ROUTINE X 2)  CULTURE, BLOOD (ROUTINE X 2)  LACTIC ACID, PLASMA  URINALYSIS, ROUTINE W REFLEX MICROSCOPIC  TROPONIN I  VALPROIC ACID LEVEL  ETHANOL  BRAIN NATRIURETIC PEPTIDE  LACTIC ACID, PLASMA  PROCALCITONIN    EKG  EKG Interpretation  Date/Time:  Tuesday May 02 2017 21:39:56 EDT Ventricular Rate:  65 PR Interval:    QRS Duration: 106 QT Interval:  456 QTC Calculation: 475 R Axis:   84 Text Interpretation:  Sinus rhythm RSR' in V1 or V2, right VCD or RVH Borderline T abnormalities, anterior leads Confirmed by Kolsen Choe  MD, Evora Schechter [phone]([id number]) on 05/02/2017 11:24:32 PM       Radiology Ct Head Wo Contrast  Result Date: 05/02/2017 CLINICAL DATA:  Patient fell at assisted living facility. Lethargy. Altered mental status. EXAM: CT HEAD WITHOUT CONTRAST CT CERVICAL SPINE WITHOUT CONTRAST TECHNIQUE: Multidetector CT imaging of the head and cervical spine was performed following the standard protocol without intravenous contrast. Multiplanar CT image reconstructions of the cervical spine were also generated. COMPARISON:  None. FINDINGS: CT HEAD FINDINGS Brain: Mild superficial atrophy. No acute intracranial hemorrhage, midline shift or edema. No large vascular territory infarction. No intra-axial mass. No extra-axial fluid collections. Vascular: No hyperdense vessel or unexpected calcification. Skull: Normal. Negative for fracture or focal lesion. Sinuses/Orbits: No acute finding. Other: None. CT CERVICAL SPINE FINDINGS Alignment: Reversal cervical lordosis which may be due to muscle spasm or positioning. Minimal grade 1 anterolisthesis C3 on C4. Craniocervical relationship and atlantodental interval are intact and in normal alignment. Skull base and vertebrae: No  acute fracture. No primary bone lesion or focal pathologic process. Soft tissues and spinal canal: No prevertebral fluid or swelling. No visible canal hematoma. Disc levels: Normal no central canal stenosis or significant neural foraminal encroachment. No jumped appearing facets. No focal disc herniations. Upper chest: Mild left apical  pleural thickening. Other: None IMPRESSION: 1. Mild superficial atrophy for age. No acute intracranial abnormality noted. 2. No acute cervical spine fracture. 3. Minimal 1 mm of grade 1 anterolisthesis of C3 on C4. Electronically Signed   By: Tollie Eth M.D.   On: 05/02/2017 23:16   Ct Cervical Spine Wo Contrast  Result Date: 05/02/2017 CLINICAL DATA:  Patient fell at assisted living facility. Lethargy. Altered mental status. EXAM: CT HEAD WITHOUT CONTRAST CT CERVICAL SPINE WITHOUT CONTRAST TECHNIQUE: Multidetector CT imaging of the head and cervical spine was performed following the standard protocol without intravenous contrast. Multiplanar CT image reconstructions of the cervical spine were also generated. COMPARISON:  None. FINDINGS: CT HEAD FINDINGS Brain: Mild superficial atrophy. No acute intracranial hemorrhage, midline shift or edema. No large vascular territory infarction. No intra-axial mass. No extra-axial fluid collections. Vascular: No hyperdense vessel or unexpected calcification. Skull: Normal. Negative for fracture or focal lesion. Sinuses/Orbits: No acute finding. Other: None. CT CERVICAL SPINE FINDINGS Alignment: Reversal cervical lordosis which may be due to muscle spasm or positioning. Minimal grade 1 anterolisthesis C3 on C4. Craniocervical relationship and atlantodental interval are intact and in normal alignment. Skull base and vertebrae: No acute fracture. No primary bone lesion or focal pathologic process. Soft tissues and spinal canal: No prevertebral fluid or swelling. No visible canal hematoma. Disc levels: Normal no central canal stenosis or  significant neural foraminal encroachment. No jumped appearing facets. No focal disc herniations. Upper chest: Mild left apical pleural thickening. Other: None IMPRESSION: 1. Mild superficial atrophy for age. No acute intracranial abnormality noted. 2. No acute cervical spine fracture. 3. Minimal 1 mm of grade 1 anterolisthesis of C3 on C4. Electronically Signed   By: Tollie Eth M.D.   On: 05/02/2017 23:16   Dg Chest Portable 1 View  Result Date: 05/02/2017 CLINICAL DATA:  Low back pain, unresponsive EXAM: PORTABLE CHEST 1 VIEW COMPARISON:  04/22/2017 FINDINGS: There are small bilateral pleural effusions. Borderline cardiomegaly. Increased central vascular congestion and diffuse interstitial and alveolar opacity which may reflect edema or pneumonia. No pneumothorax. IMPRESSION: Bilateral pleural effusions with increasing bilateral interstitial and alveolar edema or infiltrates. Electronically Signed   By: Jasmine Pang M.D.   On: 05/02/2017 22:33    Procedures Procedures (including critical care time)  Medications Ordered in ED Medications  vancomycin (VANCOCIN) IVPB 1000 mg/200 mL premix (1,000 mg Intravenous New Bag/Given 05/02/17 2255)  naloxone [facility]) injection 2 mg (2 mg Intravenous Given 05/02/17 2204)  sodium chloride 0.9 % bolus 1,000 mL (0 mLs Intravenous Stopped 05/02/17 2320)  sodium chloride 0.9 % bolus 2,000 mL (2,000 mLs Intravenous New Bag/Given 05/02/17 2319)  piperacillin-tazobactam (ZOSYN) IVPB 3.375 g (0 g Intravenous Stopped 05/02/17 2308)  naloxone [facility]) injection 2 mg (2 mg Intravenous Given 05/02/17 2230)     Initial Impression / Assessment and Plan / ED Course  I have reviewed the triage vital signs and the nursing notes.  Pertinent labs & imaging results that were available during my care of the patient were reviewed by me and considered in my medical decision making (see chart for details).     CRITICAL CARE Performed by: Mathilda Maguire L Total critical care  time: 65 minutes Critical care time was exclusive of separately billable procedures and treating other patients. Critical care was necessary to treat or prevent imminent or life-threatening deterioration. Critical care was time spent personally by me on the following activities: development of treatment plan with patient and/or surrogate as well as nursing, discussions with  consultants, evaluation of patient's response to treatment, examination of patient, obtaining history from patient or surrogate, ordering and performing treatments and interventions, ordering and review of laboratory studies, ordering and review of radiographic studies, pulse oximetry and re-evaluation of patient's condition.   Final Clinical Impressions(s) / ED Diagnoses   Final diagnoses:  Somnolence  The patient will be admitted to ICU for altered mental status. He is hypothermic. He has been treated by septic protocol. I suspect his lethargy is related to medications. His urine drug screen was positive for barbiturates and he should not be taking barbiturates  New Prescriptions New Prescriptions   No medications on file     Bethann Berkshire, MD 05/02/17 2351

## 2017-05-02 NOTE — ED Notes (Signed)
Bare hugger placed over pt due to low core temp.

## 2017-05-02 NOTE — ED Notes (Signed)
Respiratory called for ABG

## 2017-05-02 NOTE — ED Triage Notes (Signed)
Pt had fall this am at assisted living and c/o back pain. Pt c/o bilateral leg swelling and facility gave him night psych meds. Pt has snoring respirations.

## 2017-05-02 NOTE — ED Notes (Signed)
Patient transported to CT 

## 2017-05-03 ENCOUNTER — Observation Stay (HOSPITAL_BASED_OUTPATIENT_CLINIC_OR_DEPARTMENT_OTHER): Payer: Medicare Other

## 2017-05-03 ENCOUNTER — Observation Stay (HOSPITAL_COMMUNITY)
Admit: 2017-05-03 | Discharge: 2017-05-03 | Disposition: A | Discharge status: Home / Self Care | Payer: Medicare Other | Attending: Family Medicine | Admitting: Family Medicine

## 2017-05-03 ENCOUNTER — Observation Stay (HOSPITAL_COMMUNITY): Payer: Medicare Other

## 2017-05-03 DIAGNOSIS — Z880 Allergy status to penicillin: Secondary | ICD-10-CM | POA: Diagnosis not present

## 2017-05-03 DIAGNOSIS — I340 Nonrheumatic mitral (valve) insufficiency: Secondary | ICD-10-CM | POA: Diagnosis not present

## 2017-05-03 DIAGNOSIS — J9611 Chronic respiratory failure with hypoxia: Secondary | ICD-10-CM

## 2017-05-03 DIAGNOSIS — R600 Localized edema: Secondary | ICD-10-CM | POA: Diagnosis not present

## 2017-05-03 DIAGNOSIS — R400 Somnolence: Secondary | ICD-10-CM

## 2017-05-03 DIAGNOSIS — F17210 Nicotine dependence, cigarettes, uncomplicated: Secondary | ICD-10-CM | POA: Diagnosis present

## 2017-05-03 DIAGNOSIS — G9341 Metabolic encephalopathy: Secondary | ICD-10-CM | POA: Diagnosis present

## 2017-05-03 DIAGNOSIS — G40909 Epilepsy, unspecified, not intractable, without status epilepticus: Secondary | ICD-10-CM | POA: Diagnosis not present

## 2017-05-03 DIAGNOSIS — F329 Major depressive disorder, single episode, unspecified: Secondary | ICD-10-CM | POA: Diagnosis present

## 2017-05-03 DIAGNOSIS — Z79899 Other long term (current) drug therapy: Secondary | ICD-10-CM | POA: Diagnosis not present

## 2017-05-03 DIAGNOSIS — W19XXXA Unspecified fall, initial encounter: Secondary | ICD-10-CM | POA: Diagnosis present

## 2017-05-03 DIAGNOSIS — R680 Hypothermia, not associated with low environmental temperature: Secondary | ICD-10-CM | POA: Diagnosis present

## 2017-05-03 DIAGNOSIS — F419 Anxiety disorder, unspecified: Secondary | ICD-10-CM | POA: Diagnosis present

## 2017-05-03 DIAGNOSIS — F603 Borderline personality disorder: Secondary | ICD-10-CM | POA: Diagnosis present

## 2017-05-03 DIAGNOSIS — F1020 Alcohol dependence, uncomplicated: Secondary | ICD-10-CM

## 2017-05-03 DIAGNOSIS — J449 Chronic obstructive pulmonary disease, unspecified: Secondary | ICD-10-CM | POA: Diagnosis not present

## 2017-05-03 DIAGNOSIS — M545 Low back pain: Secondary | ICD-10-CM | POA: Diagnosis present

## 2017-05-03 DIAGNOSIS — K219 Gastro-esophageal reflux disease without esophagitis: Secondary | ICD-10-CM | POA: Diagnosis present

## 2017-05-03 DIAGNOSIS — R4182 Altered mental status, unspecified: Secondary | ICD-10-CM | POA: Diagnosis not present

## 2017-05-03 DIAGNOSIS — I10 Essential (primary) hypertension: Secondary | ICD-10-CM | POA: Diagnosis not present

## 2017-05-03 DIAGNOSIS — G9340 Encephalopathy, unspecified: Secondary | ICD-10-CM

## 2017-05-03 DIAGNOSIS — Z9981 Dependence on supplemental oxygen: Secondary | ICD-10-CM | POA: Diagnosis not present

## 2017-05-03 DIAGNOSIS — Z8249 Family history of ischemic heart disease and other diseases of the circulatory system: Secondary | ICD-10-CM | POA: Diagnosis not present

## 2017-05-03 LAB — ECHOCARDIOGRAM COMPLETE
AVLVOTPG: 5 mmHg
CHL CUP RV SYS PRESS: 25 mmHg
EERAT: 5.15
EWDT: 303 ms
FS: 39 % (ref 28–44)
Height: 69 in
IV/PV OW: 0.92
LA ID, A-P, ES: 36 mm
LA vol index: 16.8 mL/m2
LA vol: 34.8 mL
LADIAMINDEX: 1.74 cm/m2
LAVOLA4C: 31.8 mL
LEFT ATRIUM END SYS DIAM: 36 mm
LV E/e' medial: 5.15
LV SIMPSON'S DISK: 59
LV dias vol: 79 mL (ref 62–150)
LV e' LATERAL: 17.3 cm/s
LV sys vol index: 16 mL/m2
LVDIAVOLIN: 38 mL/m2
LVEEAVG: 5.15
LVOT SV: 97 mL
LVOT VTI: 25.4 cm
LVOT area: 3.8 cm2
LVOT diameter: 22 mm
LVOTPV: 114 cm/s
LVSYSVOL: 32 mL (ref 21–61)
MV Dec: 303
MV Peak grad: 3 mmHg
MV pk A vel: 57.8 m/s
MVPKEVEL: 89.1 m/s
PW: 11.6 mm — AB (ref 0.6–1.1)
Reg peak vel: 232 cm/s
Stroke v: 47 ml
TDI e' lateral: 17.3
TDI e' medial: 11.1
TRMAXVEL: 232 cm/s
Weight: 3051.17 oz

## 2017-05-03 LAB — TROPONIN I: Troponin I: <0.03 ng/mL (ref <0.03)

## 2017-05-03 LAB — BASIC METABOLIC PANEL
Anion gap: 7 (ref 5–15)
BUN: 7 mg/dL (ref 6–20)
CO2: 25 mmol/L (ref 22–32)
CREATININE: 0.79 mg/dL (ref 0.61–1.24)
Calcium: 7.8 mg/dL — ABNORMAL LOW (ref 8.9–10.3)
Chloride: 110 mmol/L (ref 101–111)
Glucose, Bld: 124 mg/dL — ABNORMAL HIGH (ref 65–99)
POTASSIUM: 3.5 mmol/L (ref 3.5–5.1)
SODIUM: 142 mmol/L (ref 135–145)

## 2017-05-03 LAB — BLOOD GAS, ARTERIAL
Acid-base deficit: 0.9 mmol/L (ref 0.0–2.0)
BICARBONATE: 22.7 mmol/L (ref 20.0–28.0)
DRAWN BY: 21310
O2 CONTENT: 2 L/min
O2 SAT: 91.2 %
PATIENT TEMPERATURE: 36.1
PO2 ART: 68.7 mmHg — AB (ref 83.0–108.0)
pCO2 arterial: 58.8 mmHg — ABNORMAL HIGH (ref 32.0–48.0)
pH, Arterial: 7.255 — ABNORMAL LOW (ref 7.350–7.450)

## 2017-05-03 LAB — CBC
HCT: 33 % — ABNORMAL LOW (ref 39.0–52.0)
Hemoglobin: 11.2 g/dL — ABNORMAL LOW (ref 13.0–17.0)
MCH: 32 pg (ref 26.0–34.0)
MCHC: 33.9 g/dL (ref 30.0–36.0)
MCV: 94.3 fL (ref 78.0–100.0)
PLATELETS: 192 10*3/uL (ref 150–400)
RBC: 3.5 MIL/uL — AB (ref 4.22–5.81)
RDW: 15.3 % (ref 11.5–15.5)
WBC: 3.3 10*3/uL — AB (ref 4.0–10.5)

## 2017-05-03 LAB — PROCALCITONIN: Procalcitonin: <0.1 ng/mL

## 2017-05-03 LAB — MRSA PCR SCREENING: MRSA BY PCR: NEGATIVE

## 2017-05-03 LAB — AMMONIA: Ammonia: 60 umol/L — ABNORMAL HIGH (ref 9–35)

## 2017-05-03 LAB — TSH: TSH: 4.112 u[IU]/mL (ref 0.350–4.500)

## 2017-05-03 MED ORDER — FOLIC ACID 5 MG/ML IJ SOLN
INTRAMUSCULAR | Status: AC
  Filled 2017-05-03: qty 0.2

## 2017-05-03 MED ORDER — MOMETASONE FURO-FORMOTEROL FUM 200-5 MCG/ACT IN AERO
2.0000 | INHALATION_SPRAY | Freq: Two times a day (BID) | RESPIRATORY_TRACT | Status: DC
  Administered 2017-05-03 – 2017-05-07 (×8): 2 via RESPIRATORY_TRACT
  Filled 2017-05-03: qty 8.8

## 2017-05-03 MED ORDER — PANTOPRAZOLE SODIUM 40 MG PO TBEC
40.0000 mg | DELAYED_RELEASE_TABLET | Freq: Every day | ORAL | Status: DC
  Administered 2017-05-03 – 2017-05-07 (×5): 40 mg via ORAL
  Filled 2017-05-03 (×5): qty 1

## 2017-05-03 MED ORDER — ONDANSETRON HCL 4 MG PO TABS: 4.0000 mg | ORAL_TABLET | Freq: Four times a day (QID) | ORAL | Status: DC | PRN

## 2017-05-03 MED ORDER — ESCITALOPRAM OXALATE 10 MG PO TABS
20.0000 mg | ORAL_TABLET | ORAL | Status: DC
  Administered 2017-05-03 – 2017-05-07 (×5): 20 mg via ORAL
  Filled 2017-05-03 (×5): qty 2

## 2017-05-03 MED ORDER — ACETAMINOPHEN 325 MG PO TABS
650.0000 mg | ORAL_TABLET | Freq: Four times a day (QID) | ORAL | Status: DC | PRN
  Administered 2017-05-06: 650 mg via ORAL
  Filled 2017-05-03: qty 2

## 2017-05-03 MED ORDER — M.V.I. ADULT IV INJ
INJECTION | INTRAVENOUS | Status: AC
  Filled 2017-05-03: qty 10

## 2017-05-03 MED ORDER — TRAZODONE HCL 50 MG PO TABS
300.0000 mg | ORAL_TABLET | Freq: Every day | ORAL | Status: DC
  Administered 2017-05-03: 300 mg via ORAL
  Filled 2017-05-03: qty 6

## 2017-05-03 MED ORDER — PRIMIDONE 50 MG PO TABS
250.0000 mg | ORAL_TABLET | Freq: Three times a day (TID) | ORAL | Status: DC
  Administered 2017-05-03 – 2017-05-07 (×13): 250 mg via ORAL
  Filled 2017-05-03: qty 5
  Filled 2017-05-03 (×10): qty 1
  Filled 2017-05-03: qty 5

## 2017-05-03 MED ORDER — HYDROXYZINE HCL 25 MG PO TABS
100.0000 mg | ORAL_TABLET | Freq: Three times a day (TID) | ORAL | Status: DC
  Administered 2017-05-03 (×3): 100 mg via ORAL
  Filled 2017-05-03 (×4): qty 4

## 2017-05-03 MED ORDER — ENOXAPARIN SODIUM 40 MG/0.4ML ~~LOC~~ SOLN
40.0000 mg | SUBCUTANEOUS | Status: DC
  Administered 2017-05-03 – 2017-05-06 (×4): 40 mg via SUBCUTANEOUS
  Filled 2017-05-03 (×6): qty 0.4

## 2017-05-03 MED ORDER — THIAMINE HCL 100 MG/ML IJ SOLN
Freq: Once | INTRAVENOUS | Status: AC
  Administered 2017-05-03: 02:00:00 via INTRAVENOUS
  Filled 2017-05-03: qty 1000

## 2017-05-03 MED ORDER — ONDANSETRON HCL 4 MG/2ML IJ SOLN: 4.0000 mg | Freq: Four times a day (QID) | INTRAMUSCULAR | Status: DC | PRN

## 2017-05-03 MED ORDER — THIAMINE HCL 100 MG/ML IJ SOLN
INTRAMUSCULAR | Status: AC
  Filled 2017-05-03: qty 2

## 2017-05-03 MED ORDER — DISULFIRAM 250 MG PO TABS
250.0000 mg | ORAL_TABLET | Freq: Every day | ORAL | Status: DC
  Filled 2017-05-03 (×3): qty 1

## 2017-05-03 MED ORDER — GABAPENTIN 300 MG PO CAPS
300.0000 mg | ORAL_CAPSULE | Freq: Three times a day (TID) | ORAL | Status: DC
  Administered 2017-05-03 – 2017-05-07 (×13): 300 mg via ORAL
  Filled 2017-05-03 (×13): qty 1

## 2017-05-03 MED ORDER — QUETIAPINE FUMARATE 100 MG PO TABS
200.0000 mg | ORAL_TABLET | Freq: Every day | ORAL | Status: DC
  Administered 2017-05-03 – 2017-05-06 (×4): 200 mg via ORAL
  Filled 2017-05-03 (×5): qty 1
  Filled 2017-05-03: qty 2

## 2017-05-03 MED ORDER — DIVALPROEX SODIUM ER 500 MG PO TB24
2000.0000 mg | ORAL_TABLET | Freq: Every day | ORAL | Status: DC
  Administered 2017-05-03 – 2017-05-06 (×4): 2000 mg via ORAL
  Filled 2017-05-03 (×4): qty 4

## 2017-05-03 MED ORDER — CYCLOBENZAPRINE HCL 10 MG PO TABS: 10.0000 mg | ORAL_TABLET | Freq: Three times a day (TID) | ORAL | Status: DC | PRN

## 2017-05-03 MED ORDER — NICOTINE POLACRILEX 2 MG MT GUM
2.0000 mg | CHEWING_GUM | OROMUCOSAL | Status: DC | PRN
  Filled 2017-05-03: qty 1

## 2017-05-03 MED ORDER — IPRATROPIUM-ALBUTEROL 0.5-2.5 (3) MG/3ML IN SOLN
3.0000 mL | Freq: Four times a day (QID) | RESPIRATORY_TRACT | Status: DC
  Administered 2017-05-03 – 2017-05-04 (×2): 3 mL via RESPIRATORY_TRACT
  Filled 2017-05-03 (×2): qty 3

## 2017-05-03 MED ORDER — PIPERACILLIN-TAZOBACTAM 3.375 G IVPB
3.3750 g | Freq: Three times a day (TID) | INTRAVENOUS | Status: DC
  Administered 2017-05-03 – 2017-05-04 (×4): 3.375 g via INTRAVENOUS
  Filled 2017-05-03 (×4): qty 50

## 2017-05-03 MED ORDER — VANCOMYCIN HCL IN DEXTROSE 1-5 GM/200ML-% IV SOLN
1000.0000 mg | Freq: Three times a day (TID) | INTRAVENOUS | Status: DC
  Administered 2017-05-03 – 2017-05-04 (×4): 1000 mg via INTRAVENOUS
  Filled 2017-05-03 (×4): qty 200

## 2017-05-03 MED ORDER — KETOROLAC TROMETHAMINE 30 MG/ML IJ SOLN
30.0000 mg | Freq: Four times a day (QID) | INTRAMUSCULAR | Status: DC
  Administered 2017-05-03 – 2017-05-07 (×11): 30 mg via INTRAVENOUS
  Filled 2017-05-03 (×12): qty 1

## 2017-05-03 MED ORDER — FUROSEMIDE 10 MG/ML IJ SOLN
20.0000 mg | Freq: Two times a day (BID) | INTRAMUSCULAR | Status: DC
  Administered 2017-05-03 – 2017-05-06 (×8): 20 mg via INTRAVENOUS
  Filled 2017-05-03 (×9): qty 2

## 2017-05-03 MED ORDER — ALBUTEROL SULFATE (2.5 MG/3ML) 0.083% IN NEBU: 2.5000 mg | INHALATION_SOLUTION | RESPIRATORY_TRACT | Status: DC | PRN

## 2017-05-03 NOTE — Progress Notes (Signed)
Patient admitted to the hospital earlier this morning by Dr. Onalee Huaavid.  Patient seen and examined. He is awake and sitting up in bed. Complains of pain in lower back and legs for the last few weeks. Requesting pain medications.   He was admitted to the hospital with acute encephalopathy and was unresponsive. He was noted to be hypothermic on arrival. Ammonia was mildly elevated at 60 and PCO2 in the 50s on repeat abg. With supportive care, his overall mental status has improved. Etiology of his somnolence is unclear. Possibly related to medications. He has been started on IV antibiotics for any element of infection. Blood cultures have been sent. Urinalysis and chest xray did not show any findings. EEG has been ordered. He does appear to have lower extremity edema and some venous stasis. LE dopplers have been ordered as has Echo. Will start on low dose IV lasix.  MEMON,JEHANZEB

## 2017-05-03 NOTE — Progress Notes (Signed)
Pharmacy Antibiotic Note  [name] is a 41 y.o. male admitted on 05/02/2017 with sepsis based upon presenting altered mental status and hypothermia.  Pharmacy has been consulted for vancomycin and Zosyn dosing.  Plan: 1. vancoymcin 1000 mg IV Q8h, goal 15-20 mcg/mL 2. Zosyn 3.375g IV Q8h (4 hour infusion) 3. Monitor blood cultures and clinical course  Height: 6\' 1"  (185.4 cm) Weight: 200 lb (90.7 kg) IBW/kg (Calculated) : 79.9  Temp (24hrs), Avg:95 F (35 C), Min:94.5 F (34.7 C), Max:96.4 F (35.8 C)   Recent Labs Lab 05/02/17 2146  WBC 3.6*  CREATININE 0.99  LATICACIDVEN 0.6    Estimated Creatinine Clearance: 111 mL/min (by C-G formula based on SCr of 0.99 mg/dL).    Allergies  Allergen Reactions  . Penicillins Nausea And Vomiting    Has patient had a PCN reaction causing immediate rash, facial/tongue/throat swelling, SOB or lightheadedness with hypotension:No Has patient had a PCN reaction causing severe rash involving mucus membranes or skin necrosis:No Has patient had a PCN reaction that required hospitalization:No Has patient had a PCN reaction occurring within the last 10 years:NO If all of the above answers are "NO", then may proceed with Cephalosporin use.      Antimicrobials this admission: vanc 6/12 >>  Zosyn 6/12 >>   Dose adjustments this admission: none  Microbiology results: 6/12 BCx x 2: pend  Thank you for allowing pharmacy to be a part of this patient's care.  [name], [name] 05/03/2017 1:18 AM

## 2017-05-03 NOTE — Progress Notes (Signed)
EEG Completed; Results Pending  

## 2017-05-03 NOTE — H&P (Signed)
History and Physical    [name] ZOX:[id number] DOB: Jul 06, 1976 DOA: 05/02/2017  PCP: Vinnie Langton, NP  Patient coming from: group home  Chief Complaint:  unarousable  HPI: [name] is a 41 y.o. male with medical history significant of seizure, etoh abuse, htn comes in from group home for not being able to wake up.  He was found very sleepy.  There is no other history available.  All history from ED staff and chart.  Pt is very somnulent.  Temp was low at 92.  Sepsis code was called. Pt arouses to painful stimuli.  No seizure activity in ED.  Pt has barehugger on.  Pt referred for admission for AMS.   Review of Systems:  Unobtainable due to ams  Past Medical History:  Diagnosis Date  . Anxiety   . Asthma   . Depression   . Encephalopathy   . GERD (gastroesophageal reflux disease)   . Hypertension   . Hyponatremia 06/2016  . Renal disorder    kidney injury  . Seizures (HCC)     Past Surgical History:  Procedure Laterality Date  . FINGER SURGERY Left    5th digit  . PLEURAL EFFUSION DRAINAGE Right 01/02/2016   Procedure: DRAINAGE OF PLEURAL EFFUSION;  Surgeon: Loreli Slot, MD;  Location: [facility] OR;  Service: Thoracic;  Laterality: Right;  Marland Kitchen VIDEO ASSISTED THORACOSCOPY (VATS)/DECORTICATION Right 01/02/2016   Procedure: VIDEO ASSISTED THORACOSCOPY (VATS)/DECORTICATION;  Surgeon: Loreli Slot, MD;  Location: [facility] OR;  Service: Thoracic;  Laterality: Right;  Marland Kitchen VIDEO BRONCHOSCOPY N/A 01/02/2016   Procedure: VIDEO BRONCHOSCOPY;  Surgeon: Loreli Slot, MD;  Location: [facility] OR;  Service: Thoracic;  Laterality: N/A;     reports that he has been smoking Cigarettes.  He has a 10.00 pack-year smoking history. He has never used smokeless tobacco. He reports that he does not drink alcohol or use drugs.  Allergies  Allergen Reactions  . Penicillins Nausea And Vomiting    Has patient had a PCN reaction causing immediate rash, facial/tongue/throat swelling, SOB or  lightheadedness with hypotension:No Has patient had a PCN reaction causing severe rash involving mucus membranes or skin necrosis:No Has patient had a PCN reaction that required hospitalization:No Has patient had a PCN reaction occurring within the last 10 years:NO If all of the above answers are "NO", then may proceed with Cephalosporin use.      Family History  Problem Relation Age of Onset  . CAD Mother   . Diabetes Mellitus II Father   . Prostate cancer Paternal Grandfather     Prior to Admission medications   Medication Sig Start Date End Date Taking? Authorizing Provider  albuterol (PROVENTIL HFA;VENTOLIN HFA) 108 (90 Base) MCG/ACT inhaler Inhale 2 puffs into the lungs every 4 (four) hours as needed for wheezing or shortness of breath.   Yes [provider]  amLODipine (NORVASC) 10 MG tablet Take 10 mg by mouth daily.   Yes [provider]  cyclobenzaprine (FLEXERIL) 10 MG tablet Take 10 mg by mouth every 8 (eight) hours as needed for muscle spasms.   Yes [provider]  disulfiram (ANTABUSE) 250 MG tablet Take 250 mg by mouth daily.   Yes [provider]  divalproex (DEPAKOTE ER) 500 MG 24 hr tablet Take 4 tablets (2,000 mg total) by mouth at bedtime. 11/13/15  Yes Jimmy Footman, MD  escitalopram (LEXAPRO) 20 MG tablet Take 20 mg by mouth every morning.   Yes [provider]  esomeprazole (NEXIUM) 40  MG capsule Take 40 mg by mouth daily at 12 noon.   Yes [provider]  ferrous sulfate 325 (65 FE) MG tablet Take 1 tablet (325 mg total) by mouth 2 (two) times daily with a meal. 01/11/16  Yes Regalado, Belkys A, MD  Fluticasone-Salmeterol (ADVAIR) 250-50 MCG/DOSE AEPB Inhale 1 puff into the lungs 2 (two) times daily.   Yes [provider]  furosemide (LASIX) 20 MG tablet Take 1 tablet (20 mg total) by mouth daily. 04/22/17  Yes Jacalyn LefevreHaviland, Julie, MD  gabapentin (NEURONTIN) 300 MG capsule Take 300 mg by mouth 3  (three) times daily.   Yes [provider]  hydrOXYzine (ATARAX/VISTARIL) 50 MG tablet Take 100 mg by mouth 3 (three) times daily.    Yes [provider]  ibuprofen (ADVIL,MOTRIN) 800 MG tablet Take 800 mg by mouth 3 (three) times daily as needed for moderate pain.   Yes [provider]  loperamide (IMODIUM) 2 MG capsule Take 4 mg by mouth daily as needed for diarrhea or loose stools.    Yes [provider]  Melatonin 5 MG TABS Take 10 mg by mouth at bedtime.   Yes [provider]  Multiple Vitamins-Minerals (THEREMS-M) TABS Take 1 tablet by mouth daily.   Yes [provider]  primidone (MYSOLINE) 250 MG tablet Take 250 mg by mouth 3 (three) times daily.   Yes [provider]  QUEtiapine (SEROQUEL) 200 MG tablet Take 200 mg by mouth at bedtime.   Yes [provider]  traZODone (DESYREL) 150 MG tablet Take 300 mg by mouth at bedtime.    Yes [provider]  predniSONE (DELTASONE) 10 MG tablet Take 1 tablet (10 mg total) by mouth daily with breakfast. Take 6 tablets today and then decrease by 1 tablet daily until none are left. Patient not taking: Reported on 05/02/2017 04/06/17   Philip AspenHernandez Acosta, Limmie PatriciaEstela Y, MD  sulfamethoxazole-trimethoprim (BACTRIM DS,SEPTRA DS) 800-160 MG tablet Take 1 tablet by mouth 2 (two) times daily. 6 day course starting on 04/24/2017-COMPLETED 05/01/2017    [provider]    Physical Exam: Vitals:   05/02/17 2300 05/02/17 2315 05/02/17 2320 05/02/17 2330  BP: (!) 99/59   (!) 100/58  Pulse: 65 66 66 66  Resp: 11 (!) 23 (!) 25 12  Temp: (!) 94.6 F (34.8 C) (!) 94.8 F (34.9 C) (!) 94.8 F (34.9 C) (!) 95 F (35 C)  TempSrc:      SpO2: 98% 97% 96% 97%  Weight:      Height:        Constitutional: NAD, somnulent  Awakes with painful stimuli.  Does not follow commands.  Mae with sternal rub and utters "that hurts" Vitals:   05/02/17 2300 05/02/17 2315 05/02/17 2320 05/02/17 2330    BP: (!) 99/59   (!) 100/58  Pulse: 65 66 66 66  Resp: 11 (!) 23 (!) 25 12  Temp: (!) 94.6 F (34.8 C) (!) 94.8 F (34.9 C) (!) 94.8 F (34.9 C) (!) 95 F (35 C)  TempSrc:      SpO2: 98% 97% 96% 97%  Weight:      Height:       Eyes: PERRL, lids and conjunctivae normal ENMT: Mucous membranes are moist. Posterior pharynx clear of any exudate or lesions.Normal dentition.  Neck: normal, supple, no masses, no thyromegaly Respiratory: clear to auscultation bilaterally, no wheezing, no crackles. Normal respiratory effort. No accessory muscle use.  Cardiovascular: Regular rate and rhythm, no  murmurs / rubs / gallops. Trace extremity edema. 2+ pedal pulses. No carotid bruits.  Abdomen: no tenderness, no masses palpated. No hepatosplenomegaly. Bowel sounds positive.  Musculoskeletal: no clubbing / cyanosis. No joint deformity upper and lower extremities. Good ROM, no contractures. Normal muscle tone.  Skin: no rashes, lesions, ulcers. No induration Neurologic: MAE spontaneously.  No gross deficits  Psychiatric:  Appears sedated    Labs on Admission: I have personally reviewed following labs and imaging studies  CBC:  Recent Labs Lab 05/02/17 2146  WBC 3.6*  NEUTROABS 1.5*  HGB 10.2*  HCT 29.0*  MCV 92.9  PLT 194   Basic Metabolic Panel:  Recent Labs Lab 05/02/17 2146  NA 138  K 3.2*  CL 103  CO2 26  GLUCOSE 125*  BUN 10  CREATININE 0.99  CALCIUM 8.2*   GFR: Estimated Creatinine Clearance: 111 mL/min (by C-G formula based on SCr of 0.99 mg/dL). Liver Function Tests:  Recent Labs Lab 05/02/17 2146  AST 41  ALT 32  ALKPHOS 51  BILITOT 0.3  PROT 5.7*  ALBUMIN 3.1*    Cardiac Enzymes:  Recent Labs Lab 05/02/17 2146  CKTOTAL 552*  TROPONINI <0.03    Urine analysis:    Component Value Date/Time   COLORURINE YELLOW 05/02/2017 2200   APPEARANCEUR CLEAR 05/02/2017 2200   APPEARANCEUR Clear 09/11/2014 2308   LABSPEC 1.011 05/02/2017 2200   LABSPEC 1.003  09/11/2014 2308   PHURINE 6.0 05/02/2017 2200   GLUCOSEU NEGATIVE 05/02/2017 2200   GLUCOSEU Negative 09/11/2014 2308   HGBUR NEGATIVE 05/02/2017 2200   BILIRUBINUR NEGATIVE 05/02/2017 2200   BILIRUBINUR Negative 09/11/2014 2308   KETONESUR NEGATIVE 05/02/2017 2200   PROTEINUR NEGATIVE 05/02/2017 2200   NITRITE NEGATIVE 05/02/2017 2200   LEUKOCYTESUR NEGATIVE 05/02/2017 2200   LEUKOCYTESUR Negative 09/11/2014 2308    Recent Results (from the past 240 hour(s))  Blood Culture (routine x 2)     Status: None (Preliminary result)   Collection Time: 05/02/17 10:15 PM  Result Value Ref Range Status   Specimen Description BLOOD LEFT HAND  Final   Special Requests Blood Culture adequate volume  Final   Culture PENDING  Incomplete   Report Status PENDING  Incomplete  Blood Culture (routine x 2)     Status: None (Preliminary result)   Collection Time: 05/02/17 10:22 PM  Result Value Ref Range Status   Specimen Description BLOOD LEFT HAND  Final   Special Requests   Final    BOTTLES DRAWN AEROBIC AND ANAEROBIC Blood Culture adequate volume   Culture PENDING  Incomplete   Report Status PENDING  Incomplete     Radiological Exams on Admission: Ct Head Wo Contrast  Result Date: 05/02/2017 CLINICAL DATA:  Patient fell at assisted living facility. Lethargy. Altered mental status. EXAM: CT HEAD WITHOUT CONTRAST CT CERVICAL SPINE WITHOUT CONTRAST TECHNIQUE: Multidetector CT imaging of the head and cervical spine was performed following the standard protocol without intravenous contrast. Multiplanar CT image reconstructions of the cervical spine were also generated. COMPARISON:  None. FINDINGS: CT HEAD FINDINGS Brain: Mild superficial atrophy. No acute intracranial hemorrhage, midline shift or edema. No large vascular territory infarction. No intra-axial mass. No extra-axial fluid collections. Vascular: No hyperdense vessel or unexpected calcification. Skull: Normal. Negative for fracture or focal  lesion. Sinuses/Orbits: No acute finding. Other: None. CT CERVICAL SPINE FINDINGS Alignment: Reversal cervical lordosis which may be due to muscle spasm or positioning. Minimal grade 1 anterolisthesis C3 on C4. Craniocervical relationship and atlantodental interval are  intact and in normal alignment. Skull base and vertebrae: No acute fracture. No primary bone lesion or focal pathologic process. Soft tissues and spinal canal: No prevertebral fluid or swelling. No visible canal hematoma. Disc levels: Normal no central canal stenosis or significant neural foraminal encroachment. No jumped appearing facets. No focal disc herniations. Upper chest: Mild left apical pleural thickening. Other: None IMPRESSION: 1. Mild superficial atrophy for age. No acute intracranial abnormality noted. 2. No acute cervical spine fracture. 3. Minimal 1 mm of grade 1 anterolisthesis of C3 on C4. Electronically Signed   By: Tollie Eth M.D.   On: 05/02/2017 23:16   Ct Cervical Spine Wo Contrast  Result Date: 05/02/2017 CLINICAL DATA:  Patient fell at assisted living facility. Lethargy. Altered mental status. EXAM: CT HEAD WITHOUT CONTRAST CT CERVICAL SPINE WITHOUT CONTRAST TECHNIQUE: Multidetector CT imaging of the head and cervical spine was performed following the standard protocol without intravenous contrast. Multiplanar CT image reconstructions of the cervical spine were also generated. COMPARISON:  None. FINDINGS: CT HEAD FINDINGS Brain: Mild superficial atrophy. No acute intracranial hemorrhage, midline shift or edema. No large vascular territory infarction. No intra-axial mass. No extra-axial fluid collections. Vascular: No hyperdense vessel or unexpected calcification. Skull: Normal. Negative for fracture or focal lesion. Sinuses/Orbits: No acute finding. Other: None. CT CERVICAL SPINE FINDINGS Alignment: Reversal cervical lordosis which may be due to muscle spasm or positioning. Minimal grade 1 anterolisthesis C3 on C4.  Craniocervical relationship and atlantodental interval are intact and in normal alignment. Skull base and vertebrae: No acute fracture. No primary bone lesion or focal pathologic process. Soft tissues and spinal canal: No prevertebral fluid or swelling. No visible canal hematoma. Disc levels: Normal no central canal stenosis or significant neural foraminal encroachment. No jumped appearing facets. No focal disc herniations. Upper chest: Mild left apical pleural thickening. Other: None IMPRESSION: 1. Mild superficial atrophy for age. No acute intracranial abnormality noted. 2. No acute cervical spine fracture. 3. Minimal 1 mm of grade 1 anterolisthesis of C3 on C4. Electronically Signed   By: Tollie Eth M.D.   On: 05/02/2017 23:16   Dg Chest Portable 1 View  Result Date: 05/02/2017 CLINICAL DATA:  Low back pain, unresponsive EXAM: PORTABLE CHEST 1 VIEW COMPARISON:  04/22/2017 FINDINGS: There are small bilateral pleural effusions. Borderline cardiomegaly. Increased central vascular congestion and diffuse interstitial and alveolar opacity which may reflect edema or pneumonia. No pneumothorax. IMPRESSION: Bilateral pleural effusions with increasing bilateral interstitial and alveolar edema or infiltrates. Electronically Signed   By: Jasmine Pang M.D.   On: 05/02/2017 22:33    EKG: Independently reviewed. nsr no acute issues cxr reviewed small pleural effusions edema vs infiltrates Old chart reviewed Case discussed with dr zammit  Assessment/Plan 41 yo male comes in with metabolic encephalopathy of unclear etiology Principal Problem:   Altered mental state/encephalopathy- pt is hypothermic with possible infiltrate on cxr lactic is normal procalcitonin level is also normal.  uds is pos for barbituates which are not on his med list.  etoh is neg.  Ct head in neg.  ua is neg.  It has been several hours, which would be quite long for postictal state.  No evidence of being in status epilepticus at this time.   Strongly suspect this is drug related.  Add on ammonia level.  abg several hours ago normal, will repeat now to r/o co2 narcosis.  Empirically cover with abx vanc/zosyn.  Query whether he had a seizure and aspirated.  Give bananna bag.  obs in stepdown unit pt is protecting his own airway at this time, monitor closely for the need to proceed to intubation if needed, did not respond much to narcan in ED  Active Problems:   Seizure disorder (HCC) and pseudoseizures-  Check EEG in am, seizure precautions   HTN (hypertension)- stable   Borderline personality disorder- noted   Alcohol use disorder, severe, in controlled environment (HCC)- unknown what his etoh use is lately, level is 0     DVT prophylaxis: lovenox Code Status:  full Family Communication: none  Disposition Plan:  Per day team Consults called: none Admission status:  observation   Veanna Dower A MD [facility]  If 7PM-7AM, please contact night-coverage [email or web address] Password [email or web address]  05/03/2017, 12:01 AM

## 2017-05-03 NOTE — Progress Notes (Signed)
**Note De-Identified  Obfuscation** Nasal trumpet removed; patient tolerated well.   RRT will continue to monitor.

## 2017-05-03 NOTE — Progress Notes (Signed)
*  PRELIMINARY RESULTS* Echocardiogram 2D Echocardiogram has been performed.  Stacey DrainWhite, Rheba Diamond J 05/03/2017, 11:14 AM

## 2017-05-04 DIAGNOSIS — G40909 Epilepsy, unspecified, not intractable, without status epilepticus: Secondary | ICD-10-CM

## 2017-05-04 DIAGNOSIS — I10 Essential (primary) hypertension: Secondary | ICD-10-CM

## 2017-05-04 DIAGNOSIS — J449 Chronic obstructive pulmonary disease, unspecified: Secondary | ICD-10-CM

## 2017-05-04 LAB — BLOOD GAS, ARTERIAL
ACID-BASE EXCESS: 7.6 mmol/L — AB (ref 0.0–2.0)
BICARBONATE: 30.8 mmol/L — AB (ref 20.0–28.0)
DRAWN BY: 277331
O2 Content: 2 L/min
O2 Saturation: 98.3 %
PCO2 ART: 51.3 mmHg — AB (ref 32.0–48.0)
PH ART: 7.415 (ref 7.350–7.450)
Patient temperature: 37
pO2, Arterial: 111 mmHg — ABNORMAL HIGH (ref 83.0–108.0)

## 2017-05-04 LAB — BASIC METABOLIC PANEL
ANION GAP: 6 (ref 5–15)
BUN: 6 mg/dL (ref 6–20)
CALCIUM: 8.2 mg/dL — AB (ref 8.9–10.3)
CO2: 31 mmol/L (ref 22–32)
Chloride: 103 mmol/L (ref 101–111)
Creatinine, Ser: 0.71 mg/dL (ref 0.61–1.24)
GFR calc Af Amer: >60 mL/min (ref >60)
GLUCOSE: 96 mg/dL (ref 65–99)
Potassium: 3.8 mmol/L (ref 3.5–5.1)
SODIUM: 140 mmol/L (ref 135–145)

## 2017-05-04 LAB — PROCALCITONIN

## 2017-05-04 LAB — AMMONIA: Ammonia: 79 umol/L — ABNORMAL HIGH (ref 9–35)

## 2017-05-04 MED ORDER — HYDROXYZINE HCL 25 MG PO TABS
50.0000 mg | ORAL_TABLET | Freq: Three times a day (TID) | ORAL | Status: DC
  Administered 2017-05-04 – 2017-05-07 (×8): 50 mg via ORAL
  Filled 2017-05-04 (×8): qty 2

## 2017-05-04 MED ORDER — IPRATROPIUM-ALBUTEROL 0.5-2.5 (3) MG/3ML IN SOLN
3.0000 mL | Freq: Three times a day (TID) | RESPIRATORY_TRACT | Status: DC
  Administered 2017-05-04 – 2017-05-07 (×9): 3 mL via RESPIRATORY_TRACT
  Filled 2017-05-04 (×9): qty 3

## 2017-05-04 NOTE — Progress Notes (Signed)
PROGRESS NOTE    [name]  ZOX:[id number] DOB: 1976-06-01 DOA: 05/02/2017 PCP: Vinnie Langton, NP    Brief Narrative:  41 year old male who is a resident of a group home with a history of seizure disorder, alcohol abuse in the past, was brought to the emergency room when he was found to be increasingly lethargic. He was initially found to be hypothermic and there was concern for underlying sepsis. He was admitted to the hospital for further evaluation.   Assessment & Plan:   Principal Problem:   Acute encephalopathy Active Problems:   Seizure disorder (HCC) and pseudoseizures   HTN (hypertension)   Borderline personality disorder   Alcohol use disorder, severe, in controlled environment (HCC)   Altered mental state   Chronic respiratory failure with hypoxia (HCC)   COPD (chronic obstructive pulmonary disease) (HCC)   Pedal edema   1. Acute encephalopathy. Patient presents to the hospital with increasing lethargy and was difficult to arouse. Etiology of encephalopathy was not entirely clear. He did have some metabolic abnormalities including mildly elevated ammonia as well as mildly elevated PCO2. With supportive care, his mental status had initially improved yesterday, but he is lethargic and today. EEG has been done. Etiology of his mental status change may be related to medications. Will request neurology evaluation to see if he's having possible seizures. Will decrease hydroxyzine dose from 100 mg 3 times a day to 50 mg 3 times a day. 2. Possible sepsis. Patient was noted to be hypothermic on arrival. He was started on intravenous antibiotics. Blood cultures show no growth. Hypothermia has resolved and hemodynamics have been stable. Unlikely this is infection related. Will discontinue further antibiotics for now. 3. Pedal edema. Echocardiogram is unremarkable. BNP also normal range. Venous Dopplers negative for DVT bilaterally. Started on Lasix with good urine output. Possibly  venous stasis. Continue current treatment. 4. COPD with chronic respiratory failure. He is chronically on 2 L. No evidence of wheezing at this time. Continue current treatments. 5. Seizure disorder. Chronically on Depakote. EEG has been done with report pending. 6. History of alcohol use. Currently no alcohol use since he's been in a group home.   DVT prophylaxis: lovenox Code Status: full Family Communication: no family present Disposition Plan: likely return to group home on discharge.   Consultants:     Procedures:  Echo: Left ventricle: The cavity size was normal. Wall thickness was   increased in a pattern of mild LVH. Systolic function was normal.   The estimated ejection fraction was in the range of 60% to 65%.   Wall motion was normal; there were no regional wall motion   abnormalities. Left ventricular diastolic function parameters   were normal. - Mitral valve: There was mild regurgitation. - Systemic veins: IVC is dilated with normal respiratory variation.    Estimated right atrial pressure 8 mmHg.  Antimicrobials:   Vancomycin 6/12>>6/14  Zosyn 6/12>>6/14    Subjective: Patient is lethargic. Briefly opens eyes to voice, but then falls back asleep  Objective: Vitals:   05/04/17 0800 05/04/17 1030 05/04/17 1100 05/04/17 1106  BP: 127/80 100/78 103/63   Pulse: 72 (!) 57 (!) 59 (!) 56  Resp: 18 16 10 10   Temp: 97.9 F (36.6 C) 98.4 F (36.9 C) 98.2 F (36.8 C) 98.1 F (36.7 C)  TempSrc:      SpO2: 96% 100% 97% 98%  Weight:      Height:        Intake/Output Summary (Last 24 hours) at  05/04/17 1128 Last data filed at 05/04/17 0600  Gross per 24 hour  Intake             1610 ml  Output             2550 ml  Net             -940 ml   Filed Weights   05/02/17 2133 05/03/17 0236  Weight: 90.7 kg (200 lb) 86.5 kg (190 lb 11.2 oz)    Examination:  General exam: Appears somnolent, difficult to wake up Respiratory system: Clear to auscultation.  Respiratory effort normal. Cardiovascular system: S1 & S2 heard, RRR. No JVD, murmurs, rubs, gallops or clicks. 1+ pedal edema. Gastrointestinal system: Abdomen is nondistended, soft and nontender. No organomegaly or masses felt. Normal bowel sounds heard. Central nervous system: No focal neurological deficits. Extremities: Symmetric 5 x 5 power. Skin: No rashes, lesions or ulcers Psychiatry: somnolent    Data Reviewed: I have personally reviewed following labs and imaging studies  CBC:  Recent Labs Lab 05/02/17 2146 05/03/17 0233  WBC 3.6* 3.3*  NEUTROABS 1.5*  --   HGB 10.2* 11.2*  HCT 29.0* 33.0*  MCV 92.9 94.3  PLT 194 192   Basic Metabolic Panel:  Recent Labs Lab 05/02/17 2146 05/03/17 0233 05/04/17 0531  NA 138 142 140  K 3.2* 3.5 3.8  CL 103 110 103  CO2 26 25 31   GLUCOSE 125* 124* 96  BUN 10 7 6   CREATININE 0.99 0.79 0.71  CALCIUM 8.2* 7.8* 8.2*   GFR: Estimated Creatinine Clearance: 132.3 mL/min (by C-G formula based on SCr of 0.71 mg/dL). Liver Function Tests:  Recent Labs Lab 05/02/17 2146  AST 41  ALT 32  ALKPHOS 51  BILITOT 0.3  PROT 5.7*  ALBUMIN 3.1*   No results for input(s): LIPASE, AMYLASE in the last 168 hours.  Recent Labs Lab 05/03/17 0233  AMMONIA 60*   Coagulation Profile: No results for input(s): INR, PROTIME in the last 168 hours. Cardiac Enzymes:  Recent Labs Lab 05/02/17 2146 05/03/17 0233 05/03/17 0650 05/03/17 1245  CKTOTAL 552*  --   --   --   TROPONINI <0.03 <0.03 <0.03 <0.03   BNP (last 3 results) No results for input(s): PROBNP in the last 8760 hours. HbA1C: No results for input(s): HGBA1C in the last 72 hours. CBG: No results for input(s): GLUCAP in the last 168 hours. Lipid Profile: No results for input(s): CHOL, HDL, LDLCALC, TRIG, CHOLHDL, LDLDIRECT in the last 72 hours. Thyroid Function Tests:  Recent Labs  05/02/17 2146  TSH 4.112   Anemia Panel: No results for input(s): VITAMINB12,  FOLATE, FERRITIN, TIBC, IRON, RETICCTPCT in the last 72 hours. Sepsis Labs:  Recent Labs Lab 05/02/17 2146 05/04/17 0531  PROCALCITON <0.10 <0.10  LATICACIDVEN 0.6  --     Recent Results (from the past 240 hour(s))  Blood Culture (routine x 2)     Status: None (Preliminary result)   Collection Time: 05/02/17 10:15 PM  Result Value Ref Range Status   Specimen Description BLOOD LEFT HAND  Final   Special Requests Blood Culture adequate volume  Final   Culture NO GROWTH 2 DAYS  Final   Report Status PENDING  Incomplete  Blood Culture (routine x 2)     Status: None (Preliminary result)   Collection Time: 05/02/17 10:22 PM  Result Value Ref Range Status   Specimen Description BLOOD LEFT HAND  Final   Special Requests   Final  BOTTLES DRAWN AEROBIC AND ANAEROBIC Blood Culture adequate volume   Culture NO GROWTH 2 DAYS  Final   Report Status PENDING  Incomplete  MRSA PCR Screening     Status: None   Collection Time: 05/03/17 12:57 AM  Result Value Ref Range Status   MRSA by PCR NEGATIVE NEGATIVE Final    Comment:        The GeneXpert MRSA Assay (FDA approved for NASAL specimens only), is one component of a comprehensive MRSA colonization surveillance program. It is not intended to diagnose MRSA infection nor to guide or monitor treatment for MRSA infections.          Radiology Studies: Ct Head Wo Contrast  Result Date: 05/02/2017 CLINICAL DATA:  Patient fell at assisted living facility. Lethargy. Altered mental status. EXAM: CT HEAD WITHOUT CONTRAST CT CERVICAL SPINE WITHOUT CONTRAST TECHNIQUE: Multidetector CT imaging of the head and cervical spine was performed following the standard protocol without intravenous contrast. Multiplanar CT image reconstructions of the cervical spine were also generated. COMPARISON:  None. FINDINGS: CT HEAD FINDINGS Brain: Mild superficial atrophy. No acute intracranial hemorrhage, midline shift or edema. No large vascular territory  infarction. No intra-axial mass. No extra-axial fluid collections. Vascular: No hyperdense vessel or unexpected calcification. Skull: Normal. Negative for fracture or focal lesion. Sinuses/Orbits: No acute finding. Other: None. CT CERVICAL SPINE FINDINGS Alignment: Reversal cervical lordosis which may be due to muscle spasm or positioning. Minimal grade 1 anterolisthesis C3 on C4. Craniocervical relationship and atlantodental interval are intact and in normal alignment. Skull base and vertebrae: No acute fracture. No primary bone lesion or focal pathologic process. Soft tissues and spinal canal: No prevertebral fluid or swelling. No visible canal hematoma. Disc levels: Normal no central canal stenosis or significant neural foraminal encroachment. No jumped appearing facets. No focal disc herniations. Upper chest: Mild left apical pleural thickening. Other: None IMPRESSION: 1. Mild superficial atrophy for age. No acute intracranial abnormality noted. 2. No acute cervical spine fracture. 3. Minimal 1 mm of grade 1 anterolisthesis of C3 on C4. Electronically Signed   By: Tollie Eth M.D.   On: 05/02/2017 23:16   Ct Cervical Spine Wo Contrast  Result Date: 05/02/2017 CLINICAL DATA:  Patient fell at assisted living facility. Lethargy. Altered mental status. EXAM: CT HEAD WITHOUT CONTRAST CT CERVICAL SPINE WITHOUT CONTRAST TECHNIQUE: Multidetector CT imaging of the head and cervical spine was performed following the standard protocol without intravenous contrast. Multiplanar CT image reconstructions of the cervical spine were also generated. COMPARISON:  None. FINDINGS: CT HEAD FINDINGS Brain: Mild superficial atrophy. No acute intracranial hemorrhage, midline shift or edema. No large vascular territory infarction. No intra-axial mass. No extra-axial fluid collections. Vascular: No hyperdense vessel or unexpected calcification. Skull: Normal. Negative for fracture or focal lesion. Sinuses/Orbits: No acute finding.  Other: None. CT CERVICAL SPINE FINDINGS Alignment: Reversal cervical lordosis which may be due to muscle spasm or positioning. Minimal grade 1 anterolisthesis C3 on C4. Craniocervical relationship and atlantodental interval are intact and in normal alignment. Skull base and vertebrae: No acute fracture. No primary bone lesion or focal pathologic process. Soft tissues and spinal canal: No prevertebral fluid or swelling. No visible canal hematoma. Disc levels: Normal no central canal stenosis or significant neural foraminal encroachment. No jumped appearing facets. No focal disc herniations. Upper chest: Mild left apical pleural thickening. Other: None IMPRESSION: 1. Mild superficial atrophy for age. No acute intracranial abnormality noted. 2. No acute cervical spine fracture. 3. Minimal 1 mm of grade  1 anterolisthesis of C3 on C4. Electronically Signed   By: Tollie Eth M.D.   On: 05/02/2017 23:16   US Venous Img Lower Bilateral  Result Date: 05/03/2017 CLINICAL DATA:  Bilateral pedal edema and dyspnea. EXAM: BILATERAL LOWER EXTREMITY VENOUS DOPPLER ULTRASOUND TECHNIQUE: Gray-scale sonography with graded compression, as well as color Doppler and duplex ultrasound were performed to evaluate the lower extremity deep venous systems from the level of the common femoral vein and including the common femoral, femoral, profunda femoral, popliteal and calf veins including the posterior tibial, peroneal and gastrocnemius veins when visible. The superficial great saphenous vein was also interrogated. Spectral Doppler was utilized to evaluate flow at rest and with distal augmentation maneuvers in the common femoral, femoral and popliteal veins. COMPARISON:  None. FINDINGS: RIGHT LOWER EXTREMITY Common Femoral Vein: No evidence of thrombus. Normal compressibility, respiratory phasicity and response to augmentation. Saphenofemoral Junction: No evidence of thrombus. Normal compressibility and flow on color Doppler imaging.  Profunda Femoral Vein: No evidence of thrombus. Normal compressibility and flow on color Doppler imaging. Femoral Vein: No evidence of thrombus. Normal compressibility, respiratory phasicity and response to augmentation. Popliteal Vein: No evidence of thrombus. Normal compressibility, respiratory phasicity and response to augmentation. Calf Veins: No evidence of thrombus. Normal compressibility and flow on color Doppler imaging. Superficial Great Saphenous Vein: No evidence of thrombus. Normal compressibility and flow on color Doppler imaging. Venous Reflux:  None. Other Findings:  None. LEFT LOWER EXTREMITY Common Femoral Vein: No evidence of thrombus. Normal compressibility, respiratory phasicity and response to augmentation. Saphenofemoral Junction: No evidence of thrombus. Normal compressibility and flow on color Doppler imaging. Profunda Femoral Vein: No evidence of thrombus. Normal compressibility and flow on color Doppler imaging. Femoral Vein: No evidence of thrombus. Normal compressibility, respiratory phasicity and response to augmentation. Popliteal Vein: No evidence of thrombus. Normal compressibility, respiratory phasicity and response to augmentation. Calf Veins: No evidence of thrombus. Normal compressibility and flow on color Doppler imaging. Superficial Great Saphenous Vein: No evidence of thrombus. Normal compressibility and flow on color Doppler imaging. Venous Reflux:  None. Other Findings:  Subcutaneous edema is seen in the bilateral calves. IMPRESSION: No evidence of DVT within either lower extremity. Subcutaneous edema in the bilateral calves. Electronically Signed   By: Delbert Phenix M.D.   On: 05/03/2017 11:38   Dg Chest Portable 1 View  Result Date: 05/02/2017 CLINICAL DATA:  Low back pain, unresponsive EXAM: PORTABLE CHEST 1 VIEW COMPARISON:  04/22/2017 FINDINGS: There are small bilateral pleural effusions. Borderline cardiomegaly. Increased central vascular congestion and diffuse  interstitial and alveolar opacity which may reflect edema or pneumonia. No pneumothorax. IMPRESSION: Bilateral pleural effusions with increasing bilateral interstitial and alveolar edema or infiltrates. Electronically Signed   By: Jasmine Pang M.D.   On: 05/02/2017 22:33        Scheduled Meds: . divalproex  2,000 mg Oral QHS  . enoxaparin (LOVENOX) injection  40 mg Subcutaneous Q24H  . escitalopram  20 mg Oral BH-q7a  . furosemide  20 mg Intravenous BID  . gabapentin  300 mg Oral TID  . hydrOXYzine  100 mg Oral TID  . ipratropium-albuterol  3 mL Nebulization TID  . ketorolac  30 mg Intravenous Q6H  . mometasone-formoterol  2 puff Inhalation BID  . pantoprazole  40 mg Oral Daily  . primidone  250 mg Oral TID  . QUEtiapine  200 mg Oral QHS  . traZODone  300 mg Oral QHS   Continuous Infusions: . piperacillin-tazobactam (ZOSYN)  IV Stopped (05/04/17 1034)  . vancomycin Stopped (05/04/17 0734)     LOS: 1 day    Time spent: 30mins    Barbera Perritt, MD [facility] Pager [phone]  If 7PM-7AM, please contact night-coverage [email or web address] Password [email or web address] 05/04/2017, 11:28 AM

## 2017-05-04 NOTE — Clinical Social Work Note (Addendum)
Clinical Social Work Assessment  Patient Details  Name: [name] MRN: [id number] Date of Birth: 1975-11-24  Date of referral:  05/04/17               Reason for consult:  [facility], Discharge Planning                Permission sought to share information with:  Case Manager, Magazine features editoracility Contact Representative, Guardian Permission granted to share information::  Yes, Verbal Permission Granted  Name::        Agency::  family care home  Relationship::  DSS/APS Romie LeveeMeghan Turner [phone]  [phone]  Contact Information:     Housing/Transportation Living arrangements for the past 2 months:  Group Home Source of Information:  Patient, Medical Team, Case Manager, Guardian Patient Interpreter Needed:  None Criminal Activity/Legal Involvement Pertinent to Current Situation/Hospitalization:  No - Comment as needed Significant Relationships:  Merchandiser, retailCommunity Support Lives with:  Facility Resident Do you feel safe going back to the place where you live?  Yes Need for family participation in patient care:  Yes (Comment) (ward of state)  Care giving concerns:  Patient is recent discharge and return from family care home: [facility].  Patient is a ward of state with no family.  [facility] [place] [name] contacted, message left regarding admission and plans for discharge.  Patient per assessment in May has been a LTC patient in family care home.  Will return most likely at discharge.  Independent with ADLs.    LCSW spoke with patient at the bedside who was sleeping, but awoke.  He was alert and oriented to place and admission to AP.  He reports he does not like his family care home and has been telling his guardian that he wants new placement.  He reports nothing has changed. Reports guardian does come to visit, reports her name is [name]. LCSW has called [name] and left message (still determining guardian).  Patient reports they yell at him a lot within the facility and he does  not go to a day treatment.  Patient is active with ACT team but reports they just come by and say hello, but that is all.  He is unable to report if he has a care coordinator at this time.  Patient reports no family other than in [place], but his phone is broken. Reports his mother and father have passed.   Social Worker assessment / plan:  LCSW following for return to family care home. Awaiting call back from DSS worker regarding plans and if any changes are occurring. Also working to address any mental health providers as he has had a ACT team in the past.  Will follow up with plans for discharge.  Employment status:  Disabled (Comment on whether or not currently receiving Disability) Insurance information:  Medic[place], [place] Recommend[place]:  Not assessed at this time Information / Referral to community resources:    none needed at this time  Patient/Family's Response to care:  Understanding  Patient/Family's Understanding of and Emotional Response to Diagnosis, Current Treatment, and Prognosis:  Still assessing.  Emotional Assessment Appearance:  Appears stated age Attitude/Demeanor/Rapport:    Affect (typically observed):  Accepting, Adaptable Orientation:  Oriented to Self, Oriented to Place Alcohol / Substance use:  Not Applicable Psych involvement (Current and /or in the community):  Yes (Comment) (previously had an act team, still assessing)  Discharge Needs  Concerns to be addressed:  No discharge needs identified Readmission within the last  30 days:  Yes Current discharge risk:  None Barriers to Discharge:  No Barriers Identified, Continued Medical Work up   Cordella Register 05/04/2017, 9:52 AM

## 2017-05-04 NOTE — Care Management Important Message (Signed)
Important Message  Patient Details  Name: [name] MRN: [id number] Date of Birth: 06/06/1976   Medicare Important Message Given:  Yes    [name], Chrystine OilerSharley Diane, RN 05/04/2017, 2:43 PM

## 2017-05-05 DIAGNOSIS — R600 Localized edema: Secondary | ICD-10-CM

## 2017-05-05 LAB — CBC
HCT: 32.7 % — ABNORMAL LOW (ref 39.0–52.0)
HEMOGLOBIN: 10.8 g/dL — AB (ref 13.0–17.0)
MCH: 31.9 pg (ref 26.0–34.0)
MCHC: 33 g/dL (ref 30.0–36.0)
MCV: 96.5 fL (ref 78.0–100.0)
Platelets: 190 10*3/uL (ref 150–400)
RBC: 3.39 MIL/uL — AB (ref 4.22–5.81)
RDW: 15.7 % — ABNORMAL HIGH (ref 11.5–15.5)
WBC: 4.6 10*3/uL (ref 4.0–10.5)

## 2017-05-05 LAB — BASIC METABOLIC PANEL
ANION GAP: 4 — AB (ref 5–15)
BUN: 7 mg/dL (ref 6–20)
CALCIUM: 8 mg/dL — AB (ref 8.9–10.3)
CHLORIDE: 100 mmol/L — AB (ref 101–111)
CO2: 35 mmol/L — AB (ref 22–32)
Creatinine, Ser: 0.72 mg/dL (ref 0.61–1.24)
GFR calc non Af Amer: >60 mL/min (ref >60)
Glucose, Bld: 92 mg/dL (ref 65–99)
Potassium: 3.8 mmol/L (ref 3.5–5.1)
Sodium: 139 mmol/L (ref 135–145)

## 2017-05-05 LAB — CORTISOL: CORTISOL PLASMA: 3.3 ug/dL

## 2017-05-05 LAB — VITAMIN B12: Vitamin B-12: 571 pg/mL (ref 180–914)

## 2017-05-05 LAB — PHENOBARBITAL LEVEL: PHENOBARBITAL: 24.2 ug/mL (ref 15.0–30.0)

## 2017-05-05 MED ORDER — LACTULOSE 10 GM/15ML PO SOLN
30.0000 g | Freq: Three times a day (TID) | ORAL | Status: DC
  Administered 2017-05-05 – 2017-05-07 (×7): 30 g via ORAL
  Filled 2017-05-05 (×7): qty 60

## 2017-05-05 NOTE — Progress Notes (Signed)
LCSW following as patient admitted from family care home: [facility]   Contact made with APS worker/guardian:  Meghan [phone]. Meghan reports she is patient's new guardian as Bethanie Dickeriffany Dunn was the previous Financial controllerworker.   LCSW expressed concerns with patient's medications and lethargic/ drowsy with plan to review if patient is overmedicated.  Guardian reports she will follow up with outpatient psych MD and facility regarding current medications given and patient's reports of wanting to change.  At this time, patient will return to current living situation. Guardian will review notes as indicated and follow up.  Guardian aware of dc and in agreement. Possible discharge over weekend.  Call placed to family care home and updated regarding possible discharge over weekend. Caregiver reports number to call:  [phone]  Or [phone]   (ask for Chrissie NoaWilliam) Fax: [phone]    Facility requests and medication changes please reflect on FL2 in which LCSW will complete. Aware of possible dc and agreeable if over weekend. CSW will coordinate.  Will continue to follow.  Deretha EmoryHannah Oswell Say LCSW, MSW Clinical Social Work: Optician, dispensingystem Wide Float Coverage for :  [phone]

## 2017-05-05 NOTE — Consult Note (Addendum)
[place] A. Merlene Laughter, MD     [email or web address]          [name] is an 41 y.o. male.   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 Etiologies includes medication effect and sepsis. The patient is on multiple psychotropic medications. I think we should analyze these and try to reduce the dose and the point to the of the psychotropic medications. My recommendations are as follows: 1. Check for a phenobarbital and Mysoline levels. Also other labs. 2. The Mysoline dose is rather high and I would be in favor of reducing the dose depending on the levels above. 3. I think we should reduce discontinue the trazodone and continue with the Seroquel. 4. I agree with reduced in the hydroxyzine dose of 50 mg 3 times a day.    The patient is a 41 year old white male who is a resident of a group home facility. Patient was found to be quite drowsy and nonresponsive at the facility and was taken to the hospital for further evaluation. He was found to be hypothermic with imaging study on CT of the chest suggestive of pneumonia. He has not been febrile. Other workup has been significant for elevated ammonia level of 79. He does have a history of alcoholism. The patient has been quite drowsy and unresponsive throughout the evaluation in the hospital. Last night the held several of his psychotropic medications. Notes reports that he is actually much more responsive today. He was able to eat his food and have a bowel movement. The patient tells me that he has been on primidone for the last year or so. He has not had adjustments in medication recently. Her last adjustment was about 3-4 months ago when the dose of hydroxyzine was increased. He tells me that he does not have a history of seizures although his records indicate a history of seizures. The patient complains of significant pain especially involving the right leg. Again, he resides in a  group home facility/assisted living facility. He tells me that he has been sober from constant for about 3 years. He has only one living relative as far as he knows which is his paternal uncle. He has no children.  GENERAL: This a pleasant mildly overweight man in no acute distress.  HEENT: Supple. Atraumatic normocephalic.   ABDOMEN: soft  EXTREMITIES: There is 1+ pitting edema of the legs and feet bilaterally. There is some pain involving the right lower extremity on palpation particularly the ankle.  BACK: Normal.  SKIN: Normal by inspection.    MENTAL STATUS: He is currently sitting up in the bed. He is oriented to year and hospital. He is not oriented to month. He can provide some history about his medical condition.  CRANIAL NERVES: Pupils are equal, round and reactive to light and accommodation; extra ocular movements are full, there is no significant nystagmus; visual fields are full; upper and lower facial muscles are normal in strength and symmetric, there is no flattening of the nasolabial folds; tongue is midline; uvula is midline; shoulder elevation is normal.  MOTOR: Normal tone, bulk and strength; no pronator drift.  COORDINATION: Left finger to nose is normal, right finger to nose is normal, No rest tremor; no intention tremor; no postural tremor; no bradykinesia.  REFLEXES: Deep tendon reflexes are symmetrical and normal although there are diminished in the legs requiring augmentation. Babinski reflexes are flexor bilaterally.   SENSATION: Normal to light touch.    Blood pressure  97/60, pulse (!) 59, temperature 97.5 F (36.4 C), resp. rate 16, height '5\' 9"'$  (1.753 m), weight 203 lb 11.3 oz (92.4 kg), SpO2 93 %.  Past Medical History:  Diagnosis Date  . Anxiety   . Asthma   . Depression   . Encephalopathy   . GERD (gastroesophageal reflux disease)   . Hypertension   . Hyponatremia 06/2016  . Renal disorder    kidney injury  . Seizures ([place])     Past  Surgical History:  Procedure Laterality Date  . FINGER SURGERY Left    5th digit  . PLEURAL EFFUSION DRAINAGE Right 01/02/2016   Procedure: DRAINAGE OF PLEURAL EFFUSION;  Surgeon: Melrose Nakayama, MD;  Location: [facility];  Service: Thoracic;  Laterality: Right;  Marland Kitchen VIDEO ASSISTED THORACOSCOPY (VATS)/DECORTICATION Right 01/02/2016   Procedure: VIDEO ASSISTED THORACOSCOPY (VATS)/DECORTICATION;  Surgeon: Melrose Nakayama, MD;  Location: [facility];  Service: Thoracic;  Laterality: Right;  Marland Kitchen VIDEO BRONCHOSCOPY N/A 01/02/2016   Procedure: VIDEO BRONCHOSCOPY;  Surgeon: Melrose Nakayama, MD;  Location: [facility] OR;  Service: Thoracic;  Laterality: N/A;    Family History  Problem Relation Age of Onset  . CAD Mother   . Diabetes Mellitus II Father   . Prostate cancer Paternal Grandfather     Social History:  reports that he has been smoking Cigarettes.  He has a 10.00 pack-year smoking history. He has never used smokeless tobacco. He reports that he does not drink alcohol or use drugs.  Allergies:  Allergies  Allergen Reactions  . Penicillins Nausea And Vomiting    Has patient had a PCN reaction causing immediate rash, facial/tongue/throat swelling, SOB or lightheadedness with hypotension:No Has patient had a PCN reaction causing severe rash involving mucus membranes or skin necrosis:No Has patient had a PCN reaction that required hospitalization:No Has patient had a PCN reaction occurring within the last 10 years:NO If all of the above answers are "NO", then may proceed with Cephalosporin use.      Medications: Prior to Admission medications   Medication Sig Start Date End Date Taking? Authorizing Provider  albuterol (PROVENTIL HFA;VENTOLIN HFA) 108 (90 Base) MCG/ACT inhaler Inhale 2 puffs into the lungs every 4 (four) hours as needed for wheezing or shortness of breath.   Yes [provider]  amLODipine (NORVASC) 10 MG tablet Take 10 mg by mouth daily.   Yes [provider]  cyclobenzaprine (FLEXERIL) 10 MG tablet Take 10 mg by mouth every 8 (eight) hours as needed for muscle spasms.   Yes [provider]  disulfiram (ANTABUSE) 250 MG tablet Take 250 mg by mouth daily.   Yes [provider]  divalproex (DEPAKOTE ER) 500 MG 24 hr tablet Take 4 tablets (2,000 mg total) by mouth at bedtime. 11/13/15  Yes Hildred Priest, MD  escitalopram (LEXAPRO) 20 MG tablet Take 20 mg by mouth every morning.   Yes [provider]  esomeprazole (NEXIUM) 40 MG capsule Take 40 mg by mouth daily at 12 noon.   Yes [provider]  ferrous sulfate 325 (65 FE) MG tablet Take 1 tablet (325 mg total) by mouth 2 (two) times daily with a meal. 01/11/16  Yes Regalado, Belkys A, MD  Fluticasone-Salmeterol (ADVAIR) 250-50 MCG/DOSE AEPB Inhale 1 puff into the lungs 2 (two) times daily.   Yes [provider]  furosemide (LASIX) 20 MG tablet Take 1 tablet (20 mg total) by mouth daily. 04/22/17  Yes Isla Pence, MD  gabapentin (NEURONTIN) 300 MG capsule  Take 300 mg by mouth 3 (three) times daily.   Yes [provider]  hydrOXYzine (ATARAX/VISTARIL) 50 MG tablet Take 100 mg by mouth 3 (three) times daily.    Yes [provider]  ibuprofen (ADVIL,MOTRIN) 800 MG tablet Take 800 mg by mouth 3 (three) times daily as needed for moderate pain.   Yes [provider]  loperamide (IMODIUM) 2 MG capsule Take 4 mg by mouth daily as needed for diarrhea or loose stools.    Yes [provider]  Melatonin 5 MG TABS Take 10 mg by mouth at bedtime.   Yes [provider]  Multiple Vitamins-Minerals (THEREMS-M) TABS Take 1 tablet by mouth daily.   Yes [provider]  primidone (MYSOLINE) 250 MG tablet Take 250 mg by mouth 3 (three) times daily.   Yes [provider]  QUEtiapine (SEROQUEL) 200 MG tablet Take 200 mg by mouth at bedtime.   Yes [provider]  traZODone (DESYREL) 150 MG tablet  Take 300 mg by mouth at bedtime.    Yes [provider]  predniSONE (DELTASONE) 10 MG tablet Take 1 tablet (10 mg total) by mouth daily with breakfast. Take 6 tablets today and then decrease by 1 tablet daily until none are left. Patient not taking: Reported on 05/02/2017 04/06/17   Isaac Bliss, Rayford Halsted, MD  sulfamethoxazole-trimethoprim (BACTRIM DS,SEPTRA DS) 800-160 MG tablet Take 1 tablet by mouth 2 (two) times daily. 6 day course starting on 04/24/2017-COMPLETED 05/01/2017    [provider]    Scheduled Meds: . divalproex  2,000 mg Oral QHS  . enoxaparin (LOVENOX) injection  40 mg Subcutaneous Q24H  . escitalopram  20 mg Oral BH-q7a  . furosemide  20 mg Intravenous BID  . gabapentin  300 mg Oral TID  . hydrOXYzine  50 mg Oral TID  . ipratropium-albuterol  3 mL Nebulization TID  . ketorolac  30 mg Intravenous Q6H  . mometasone-formoterol  2 puff Inhalation BID  . pantoprazole  40 mg Oral Daily  . primidone  250 mg Oral TID  . QUEtiapine  200 mg Oral QHS  . traZODone  300 mg Oral QHS   Continuous Infusions: PRN Meds:.acetaminophen, albuterol, cyclobenzaprine, nicotine polacrilex, ondansetron **OR** ondansetron (ZOFRAN) IV     Results for orders placed or performed during the hospital encounter of 05/02/17 (from the past 48 hour(s))  Troponin I     Status: None   Collection Time: 05/03/17 12:45 PM  Result Value Ref Range   Troponin I <0.03 <0.03 ng/mL  Procalcitonin     Status: None   Collection Time: 05/04/17  5:31 AM  Result Value Ref Range   Procalcitonin <0.10 ng/mL    Comment:        Interpretation: PCT (Procalcitonin) <= 0.5 ng/mL: Systemic infection (sepsis) is not likely. Local bacterial infection is possible. (NOTE)         ICU PCT Algorithm               Non ICU PCT Algorithm    ----------------------------     ------------------------------         PCT < 0.25 ng/mL                 PCT < 0.1 ng/mL     Stopping of antibiotics             Stopping of antibiotics       strongly encouraged.  strongly encouraged.    ----------------------------     ------------------------------       PCT level decrease by               PCT < 0.25 ng/mL       >= 80% from peak PCT       OR PCT 0.25 - 0.5 ng/mL          Stopping of antibiotics                                             encouraged.     Stopping of antibiotics           encouraged.    ----------------------------     ------------------------------       PCT level decrease by              PCT >= 0.25 ng/mL       < 80% from peak PCT        AND PCT >= 0.5 ng/mL            Continuin g antibiotics                                              encouraged.       Continuing antibiotics            encouraged.    ----------------------------     ------------------------------     PCT level increase compared          PCT > 0.5 ng/mL         with peak PCT AND          PCT >= 0.5 ng/mL             Escalation of antibiotics                                          strongly encouraged.      Escalation of antibiotics        strongly encouraged.   Basic metabolic panel     Status: Abnormal   Collection Time: 05/04/17  5:31 AM  Result Value Ref Range   Sodium 140 135 - 145 mmol/L   Potassium 3.8 3.5 - 5.1 mmol/L   Chloride 103 101 - 111 mmol/L   CO2 31 22 - 32 mmol/L   Glucose, Bld 96 65 - 99 mg/dL   BUN 6 6 - 20 mg/dL   Creatinine, Ser 0.71 0.61 - 1.24 mg/dL   Calcium 8.2 (L) 8.9 - 10.3 mg/dL   GFR calc non Af Amer >60 >60 mL/min   GFR calc Af Amer >60 >60 mL/min    Comment: (NOTE) The eGFR has been calculated using the CKD EPI equation. This calculation has not been validated in all clinical situations. eGFR's persistently <60 mL/min signify possible Chronic Kidney Disease.    Anion gap 6 5 - 15  Ammonia     Status: Abnormal   Collection Time: 05/04/17 12:28 PM  Result Value Ref Range   Ammonia 79 (H) 9 - 35 umol/L  Blood gas, arterial     Status: Abnormal  Collection Time: 05/04/17 12:40 PM  Result Value Ref Range   O2 Content 2.0 L/min   Delivery systems NASAL CANNULA    pH, Arterial 7.415 7.350 - 7.450   pCO2 arterial 51.3 (H) 32.0 - 48.0 mmHg   pO2, Arterial 111 (H) 83.0 - 108.0 mmHg   Bicarbonate 30.8 (H) 20.0 - 28.0 mmol/L   Acid-Base Excess 7.6 (H) 0.0 - 2.0 mmol/L   O2 Saturation 98.3 %   Patient temperature 37.0    Collection site RIGHT RADIAL    Drawn by [id number]    Sample type ARTERIAL DRAW    Allens test (pass/fail) PASS PASS  Basic metabolic panel     Status: Abnormal   Collection Time: 05/05/17  5:20 AM  Result Value Ref Range   Sodium 139 135 - 145 mmol/L   Potassium 3.8 3.5 - 5.1 mmol/L   Chloride 100 (L) 101 - 111 mmol/L   CO2 35 (H) 22 - 32 mmol/L   Glucose, Bld 92 65 - 99 mg/dL   BUN 7 6 - 20 mg/dL   Creatinine, Ser 0.72 0.61 - 1.24 mg/dL   Calcium 8.0 (L) 8.9 - 10.3 mg/dL   GFR calc non Af Amer >60 >60 mL/min   GFR calc Af Amer >60 >60 mL/min    Comment: (NOTE) The eGFR has been calculated using the CKD EPI equation. This calculation has not been validated in all clinical situations. eGFR's persistently <60 mL/min signify possible Chronic Kidney Disease.    Anion gap 4 (L) 5 - 15  CBC     Status: Abnormal   Collection Time: 05/05/17  5:20 AM  Result Value Ref Range   WBC 4.6 4.0 - 10.5 K/uL   RBC 3.39 (L) 4.22 - 5.81 MIL/uL   Hemoglobin 10.8 (L) 13.0 - 17.0 g/dL   HCT 32.7 (L) 39.0 - 52.0 %   MCV 96.5 78.0 - 100.0 fL   MCH 31.9 26.0 - 34.0 pg   MCHC 33.0 30.0 - 36.0 g/dL   RDW 15.7 (H) 11.5 - 15.5 %   Platelets 190 150 - 400 K/uL    Studies/Results:     Gay Moncivais A. Merlene Laughter, M.D.  Diplomate, Tax adviser of Psychiatry and Neurology ( Neurology). 05/05/2017, 7:32 AM

## 2017-05-05 NOTE — Procedures (Signed)
[facility] Shane Gemme A. Gerilyn Pilgrim, MD     [email or web address]           HISTORY: The patient is a 41 year old who presents with altered mental status and seizures.  MEDICATIONS: Scheduled Meds: . divalproex  2,000 mg Oral QHS  . enoxaparin (LOVENOX) injection  40 mg Subcutaneous Q24H  . escitalopram  20 mg Oral BH-q7a  . furosemide  20 mg Intravenous BID  . gabapentin  300 mg Oral TID  . hydrOXYzine  50 mg Oral TID  . ipratropium-albuterol  3 mL Nebulization TID  . ketorolac  30 mg Intravenous Q6H  . mometasone-formoterol  2 puff Inhalation BID  . pantoprazole  40 mg Oral Daily  . primidone  250 mg Oral TID  . QUEtiapine  200 mg Oral QHS  . traZODone  300 mg Oral QHS   Continuous Infusions: PRN Meds:.acetaminophen, albuterol, cyclobenzaprine, nicotine polacrilex, ondansetron **OR** ondansetron (ZOFRAN) IV  Prior to Admission medications   Medication Sig Start Date End Date Taking? Authorizing Provider  albuterol (PROVENTIL HFA;VENTOLIN HFA) 108 (90 Base) MCG/ACT inhaler Inhale 2 puffs into the lungs every 4 (four) hours as needed for wheezing or shortness of breath.   Yes [provider]  amLODipine (NORVASC) 10 MG tablet Take 10 mg by mouth daily.   Yes [provider]  cyclobenzaprine (FLEXERIL) 10 MG tablet Take 10 mg by mouth every 8 (eight) hours as needed for muscle spasms.   Yes [provider]  disulfiram (ANTABUSE) 250 MG tablet Take 250 mg by mouth daily.   Yes [provider]  divalproex (DEPAKOTE ER) 500 MG 24 hr tablet Take 4 tablets (2,000 mg total) by mouth at bedtime. 11/13/15  Yes Jimmy Footman, MD  escitalopram (LEXAPRO) 20 MG tablet Take 20 mg by mouth every morning.   Yes [provider]  esomeprazole (NEXIUM) 40 MG capsule Take 40 mg by mouth daily at 12 noon.   Yes [provider]  ferrous sulfate 325 (65 FE) MG tablet Take 1 tablet (325 mg total) by mouth 2 (two) times daily with a  meal. 01/11/16  Yes Regalado, Belkys A, MD  Fluticasone-Salmeterol (ADVAIR) 250-50 MCG/DOSE AEPB Inhale 1 puff into the lungs 2 (two) times daily.   Yes [provider]  furosemide (LASIX) 20 MG tablet Take 1 tablet (20 mg total) by mouth daily. 04/22/17  Yes Jacalyn Lefevre, MD  gabapentin (NEURONTIN) 300 MG capsule Take 300 mg by mouth 3 (three) times daily.   Yes [provider]  hydrOXYzine (ATARAX/VISTARIL) 50 MG tablet Take 100 mg by mouth 3 (three) times daily.    Yes [provider]  ibuprofen (ADVIL,MOTRIN) 800 MG tablet Take 800 mg by mouth 3 (three) times daily as needed for moderate pain.   Yes [provider]  loperamide (IMODIUM) 2 MG capsule Take 4 mg by mouth daily as needed for diarrhea or loose stools.    Yes [provider]  Melatonin 5 MG TABS Take 10 mg by mouth at bedtime.   Yes [provider]  Multiple Vitamins-Minerals (THEREMS-M) TABS Take 1 tablet by mouth daily.   Yes [provider]  primidone (MYSOLINE) 250 MG tablet Take 250 mg by mouth 3 (three) times daily.   Yes [provider]  QUEtiapine (SEROQUEL) 200 MG tablet Take 200 mg by mouth at bedtime.   Yes [provider]  traZODone (DESYREL) 150 MG tablet Take 300 mg by mouth at bedtime.    Yes [provider]  predniSONE (DELTASONE) 10 MG tablet Take 1 tablet (10 mg total) by mouth daily with breakfast. Take 6 tablets today and then decrease by 1 tablet daily until none are left. Patient not taking: Reported on 05/02/2017 04/06/17   Philip AspenHernandez Acosta, Limmie PatriciaEstela Y, MD  sulfamethoxazole-trimethoprim (BACTRIM DS,SEPTRA DS) 800-160 MG tablet Take 1 tablet by mouth 2 (two) times daily. 6 day course starting on 04/24/2017-COMPLETED 05/01/2017    [provider]      ANALYSIS: A 16 channel recording using standard 10 20 measurements is conducted for 21 minutes. The background activity gases high as 7 Hz. There is beta activity observing  the frontal areas. Awake and some sleep activities are observed. There is increased spindling noted throughout the recording. Photic simulation and hypoventilation are not conducted. There is no focal or lateral slowing. There is no epileptiform activities observed.   IMPRESSION: This recording shows mild global slowing indicating a global encephalopathy. However, there is no epileptiform activities observed.      Eilah Common A. Gerilyn Pilgrimoonquah, M.D.  Diplomate, Biomedical engineerAmerican Board of Psychiatry and Neurology ( Neurology).

## 2017-05-05 NOTE — Progress Notes (Signed)
PROGRESS NOTE    [name]  ZOX:[id number] DOB: Jul 23, 1976 DOA: 05/02/2017 PCP: Vinnie Langton, NP    Brief Narrative:  41 year old male who is a resident of a group home with a history of seizure disorder, alcohol abuse in the past, was brought to the emergency room when he was found to be increasingly lethargic. He was initially found to be hypothermic and there was concern for underlying sepsis. He was admitted to the hospital for further evaluation.   Assessment & Plan:   Principal Problem:   Acute encephalopathy Active Problems:   Seizure disorder (HCC) and pseudoseizures   HTN (hypertension)   Borderline personality disorder   Alcohol use disorder, severe, in controlled environment (HCC)   Altered mental state   Chronic respiratory failure with hypoxia (HCC)   COPD (chronic obstructive pulmonary disease) (HCC)   Pedal edema   1. Acute encephalopathy. Patient presents to the hospital with increasing lethargy and was difficult to arouse. Etiology of encephalopathy was not entirely clear. He did have some metabolic abnormalities including mildly elevated ammonia as well as mildly elevated PCO2. Follow PCO2 was in normal range, but ammonia remains elevated. He's been started on lactulose. EEG has been done and did not show any epileptiform discharges. Etiology of his mental status change may be related to medications. Neurology is following the patient. Hydroxyzine dose has been decreased. Will discontinue trazodone. Neurology is evaluating barbiturates.. 2. Possible sepsis. Patient was noted to be hypothermic on arrival. He was started on intravenous antibiotics. Blood cultures show no growth. Hypothermia has resolved and hemodynamics have been stable. Unlikely this is infection related. Will discontinue further antibiotics for now. 3. Pedal edema. Echocardiogram is unremarkable. BNP also normal range. Venous Dopplers negative for DVT bilaterally. Started on Lasix with good urine  output. Possibly venous stasis. Continue current treatment. 4. COPD with chronic respiratory failure. He is chronically on 2 L. No evidence of wheezing at this time. Continue current treatments. 5. Seizure disorder. Chronically on Depakote. EEG did not show any epileptiform discharges 6. History of alcohol use. Currently no alcohol use since he's been in a group home.   DVT prophylaxis: lovenox Code Status: full Family Communication: no family present Disposition Plan: likely return to group home on discharge.   Consultants:   Neurology  Procedures:  Echo: Left ventricle: The cavity size was normal. Wall thickness was   increased in a pattern of mild LVH. Systolic function was normal.   The estimated ejection fraction was in the range of 60% to 65%.   Wall motion was normal; there were no regional wall motion   abnormalities. Left ventricular diastolic function parameters   were normal. - Mitral valve: There was mild regurgitation. - Systemic veins: IVC is dilated with normal respiratory variation.    Estimated right atrial pressure 8 mmHg.  Antimicrobials:   Vancomycin 6/12>>6/14  Zosyn 6/12>>6/14    Subjective: Patient is lethargic. Wakes up to voice, but falls back asleep. Was awake earlier and had breakfast.  Objective: Vitals:   05/05/17 1600 05/05/17 1700 05/05/17 1800 05/05/17 1900  BP: 114/66  120/67   Pulse: 64 63 66 73  Resp: 14 19 17 18   Temp: 98.8 F (37.1 C) 99 F (37.2 C) 99 F (37.2 C) 99.1 F (37.3 C)  TempSrc:      SpO2: 94% 96% (!) 89% 99%  Weight:      Height:        Intake/Output Summary (Last 24 hours) at 05/05/17 2006 Last data  filed at 05/05/17 1800  Gross per 24 hour  Intake             1550 ml  Output             3675 ml  Net            -2125 ml   Filed Weights   05/02/17 2133 05/03/17 0236 05/05/17 0500  Weight: 90.7 kg (200 lb) 86.5 kg (190 lb 11.2 oz) 92.4 kg (203 lb 11.3 oz)    Examination:  General exam: Appears  somnolent, difficult to wake up Respiratory system: Clear to auscultation. Respiratory effort normal. Cardiovascular system: S1 & S2 heard, RRR. No JVD, murmurs, rubs, gallops or clicks. 1+ pedal edema. Gastrointestinal system: Abdomen is nondistended, soft and nontender. No organomegaly or masses felt. Normal bowel sounds heard. Central nervous system: No focal neurological deficits. Extremities: Symmetric 5 x 5 power. Skin: No rashes, lesions or ulcers Psychiatry: somnolent    Data Reviewed: I have personally reviewed following labs and imaging studies  CBC:  Recent Labs Lab 05/02/17 2146 05/03/17 0233 05/05/17 0520  WBC 3.6* 3.3* 4.6  NEUTROABS 1.5*  --   --   HGB 10.2* 11.2* 10.8*  HCT 29.0* 33.0* 32.7*  MCV 92.9 94.3 96.5  PLT 194 192 190   Basic Metabolic Panel:  Recent Labs Lab 05/02/17 2146 05/03/17 0233 05/04/17 0531 05/05/17 0520  NA 138 142 140 139  K 3.2* 3.5 3.8 3.8  CL 103 110 103 100*  CO2 26 25 31  35*  GLUCOSE 125* 124* 96 92  BUN 10 7 6 7   CREATININE 0.99 0.79 0.71 0.72  CALCIUM 8.2* 7.8* 8.2* 8.0*   GFR: Estimated Creatinine Clearance: 136.5 mL/min (by C-G formula based on SCr of 0.72 mg/dL). Liver Function Tests:  Recent Labs Lab 05/02/17 2146  AST 41  ALT 32  ALKPHOS 51  BILITOT 0.3  PROT 5.7*  ALBUMIN 3.1*   No results for input(s): LIPASE, AMYLASE in the last 168 hours.  Recent Labs Lab 05/03/17 0233 05/04/17 1228  AMMONIA 60* 79*   Coagulation Profile: No results for input(s): INR, PROTIME in the last 168 hours. Cardiac Enzymes:  Recent Labs Lab 05/02/17 2146 05/03/17 0233 05/03/17 0650 05/03/17 1245  CKTOTAL 552*  --   --   --   TROPONINI <0.03 <0.03 <0.03 <0.03   BNP (last 3 results) No results for input(s): PROBNP in the last 8760 hours. HbA1C: No results for input(s): HGBA1C in the last 72 hours. CBG: No results for input(s): GLUCAP in the last 168 hours. Lipid Profile: No results for input(s): CHOL, HDL,  LDLCALC, TRIG, CHOLHDL, LDLDIRECT in the last 72 hours. Thyroid Function Tests:  Recent Labs  05/02/17 2146  TSH 4.112   Anemia Panel:  Recent Labs  05/05/17 0955  VITAMINB12 571   Sepsis Labs:  Recent Labs Lab 05/02/17 2146 05/04/17 0531  PROCALCITON <0.10 <0.10  LATICACIDVEN 0.6  --     Recent Results (from the past 240 hour(s))  Blood Culture (routine x 2)     Status: None (Preliminary result)   Collection Time: 05/02/17 10:15 PM  Result Value Ref Range Status   Specimen Description BLOOD LEFT HAND  Final   Special Requests Blood Culture adequate volume  Final   Culture NO GROWTH 3 DAYS  Final   Report Status PENDING  Incomplete  Blood Culture (routine x 2)     Status: None (Preliminary result)   Collection Time: 05/02/17 10:22 PM  Result Value Ref Range Status   Specimen Description BLOOD LEFT HAND  Final   Special Requests   Final    BOTTLES DRAWN AEROBIC AND ANAEROBIC Blood Culture adequate volume   Culture NO GROWTH 3 DAYS  Final   Report Status PENDING  Incomplete  MRSA PCR Screening     Status: None   Collection Time: 05/03/17 12:57 AM  Result Value Ref Range Status   MRSA by PCR NEGATIVE NEGATIVE Final    Comment:        The GeneXpert MRSA Assay (FDA approved for NASAL specimens only), is one component of a comprehensive MRSA colonization surveillance program. It is not intended to diagnose MRSA infection nor to guide or monitor treatment for MRSA infections.          Radiology Studies: No results found.      Scheduled Meds: . divalproex  2,000 mg Oral QHS  . enoxaparin (LOVENOX) injection  40 mg Subcutaneous Q24H  . escitalopram  20 mg Oral BH-q7a  . furosemide  20 mg Intravenous BID  . gabapentin  300 mg Oral TID  . hydrOXYzine  50 mg Oral TID  . ipratropium-albuterol  3 mL Nebulization TID  . ketorolac  30 mg Intravenous Q6H  . lactulose  30 g Oral TID  . mometasone-formoterol  2 puff Inhalation BID  . pantoprazole  40 mg  Oral Daily  . primidone  250 mg Oral TID  . QUEtiapine  200 mg Oral QHS  . traZODone  300 mg Oral QHS   Continuous Infusions:    LOS: 2 days    Time spent:    MEMON,JEHANZEB, MD [facility] Pager [phone]  If 7PM-7AM, please contact night-coverage [email or web address] Password [facility] 05/05/2017, 8:06 PM

## 2017-05-06 ENCOUNTER — Inpatient Hospital Stay (HOSPITAL_COMMUNITY): Payer: Medicare Other

## 2017-05-06 LAB — BASIC METABOLIC PANEL
Anion gap: 7 (ref 5–15)
BUN: 5 mg/dL — ABNORMAL LOW (ref 6–20)
CALCIUM: 8.5 mg/dL — AB (ref 8.9–10.3)
CO2: 33 mmol/L — ABNORMAL HIGH (ref 22–32)
CREATININE: 0.66 mg/dL (ref 0.61–1.24)
Chloride: 101 mmol/L (ref 101–111)
Glucose, Bld: 105 mg/dL — ABNORMAL HIGH (ref 65–99)
Potassium: 4 mmol/L (ref 3.5–5.1)
SODIUM: 141 mmol/L (ref 135–145)

## 2017-05-06 LAB — AMMONIA: AMMONIA: 67 umol/L — AB (ref 9–35)

## 2017-05-06 LAB — URINALYSIS, ROUTINE W REFLEX MICROSCOPIC
Bacteria, UA: NONE SEEN
Bilirubin Urine: NEGATIVE
GLUCOSE, UA: NEGATIVE mg/dL
Ketones, ur: NEGATIVE mg/dL
Leukocytes, UA: NEGATIVE
NITRITE: NEGATIVE
PH: 6 (ref 5.0–8.0)
PROTEIN: NEGATIVE mg/dL
SPECIFIC GRAVITY, URINE: 1.004 — AB (ref 1.005–1.030)
Squamous Epithelial / LPF: NONE SEEN

## 2017-05-06 LAB — RPR: RPR: NONREACTIVE

## 2017-05-06 LAB — PROCALCITONIN

## 2017-05-06 MED ORDER — HYDROCODONE-ACETAMINOPHEN 5-325 MG PO TABS
1.0000 | ORAL_TABLET | Freq: Four times a day (QID) | ORAL | Status: DC | PRN
  Administered 2017-05-06 – 2017-05-07 (×4): 1 via ORAL
  Filled 2017-05-06 (×4): qty 1

## 2017-05-06 MED ORDER — HYDROCODONE-ACETAMINOPHEN 5-325 MG PO TABS
1.0000 | ORAL_TABLET | Freq: Two times a day (BID) | ORAL | Status: DC | PRN
  Administered 2017-05-06: 1 via ORAL
  Filled 2017-05-06 (×2): qty 1

## 2017-05-06 MED ORDER — PRIMIDONE 250 MG PO TABS
ORAL_TABLET | ORAL | Status: AC
  Filled 2017-05-06: qty 1

## 2017-05-06 NOTE — Plan of Care (Signed)
Problem: Skin Integrity: Goal: Risk for impaired skin integrity will decrease Outcome: Progressing BILATERAL LOWER EXTREMITY EDEMA IS DECREASING

## 2017-05-06 NOTE — Progress Notes (Signed)
PROGRESS NOTE    [name]  ZOX:[id number] DOB: [birth year] DOA: 05/02/2017 PCP: Vinnie Langton, NP    Brief Narrative:  41 year old male who is a resident of a group home with a history of seizure disorder, alcohol abuse in the past, was brought to the emergency room when he was found to be increasingly lethargic. He was initially found to be hypothermic and there was concern for underlying sepsis. He was admitted to the hospital for further evaluation.   Assessment & Plan:   Principal Problem:   Acute encephalopathy Active Problems:   Seizure disorder (HCC) and pseudoseizures   HTN (hypertension)   Borderline personality disorder   Alcohol use disorder, severe, in controlled environment (HCC)   Altered mental state   Chronic respiratory failure with hypoxia (HCC)   COPD (chronic obstructive pulmonary disease) (HCC)   Pedal edema   1. Acute encephalopathy. Patient presents to the hospital with increasing lethargy and was difficult to arouse. Etiology of encephalopathy was not entirely clear. He did have some metabolic abnormalities including mildly elevated ammonia as well as mildly elevated PCO2. Follow up PCO2 was in normal range, but ammonia remains elevated. He's been started on lactulose and ammonia is mildly better today. Will continue lactulose. EEG did not show any epileptiform discharges. Etiology of his mental status change may be related to medications. Neurology is following the patient. Hydroxyzine dose has been decreased and trazodone has been discontinued. Neurology is evaluating barbiturates. Overall mental status appears to be improving. 2. Possible sepsis. Patient was noted to be hypothermic on arrival. He was started on intravenous antibiotics. Blood cultures show no growth. Hypothermia has resolved and hemodynamics have been stable. Unlikely this was infection related. Antibiotics were discontinued. 3. Pedal edema. Echocardiogram is unremarkable. BNP also normal  range. Venous Dopplers negative for DVT bilaterally. Started on Lasix with good urine output. Possibly venous stasis. Volume status is -8.3L. Edema is improving. Continue current treatment. 4. COPD with chronic respiratory failure. He is chronically on 2 L. No evidence of wheezing at this time. Continue current treatments. 5. Seizure disorder. Chronically on Depakote. EEG did not show any epileptiform discharges 6. History of alcohol use. Currently no alcohol use since he's been in a group home. 7. Fever. Patient has been having low grade fevers overnight. Check chest xray and urinalysis.   DVT prophylaxis: lovenox Code Status: full Family Communication: no family present Disposition Plan: likely return to group home on discharge.   Consultants:   Neurology  Procedures:  Echo: Left ventricle: The cavity size was normal. Wall thickness was   increased in a pattern of mild LVH. Systolic function was normal.   The estimated ejection fraction was in the range of 60% to 65%.   Wall motion was normal; there were no regional wall motion   abnormalities. Left ventricular diastolic function parameters   were normal. - Mitral valve: There was mild regurgitation. - Systemic veins: IVC is dilated with normal respiratory variation.    Estimated right atrial pressure 8 mmHg.  Antimicrobials:   Vancomycin 6/12>>6/14  Zosyn 6/12>>6/14    Subjective: Complains of pain in his legs and feels weak. Has a productive cough of greenish sputum  Objective: Vitals:   05/06/17 0300 05/06/17 0400 05/06/17 0500 05/06/17 0835  BP:  128/80    Pulse: 78 76 76   Resp: 20 20 (!) 22   Temp: 100.2 F (37.9 C) (!) 100.4 F (38 C) 100.2 F (37.9 C)   TempSrc:  SpO2: 92% 96% 95% 100%  Weight:      Height:        Intake/Output Summary (Last 24 hours) at 05/06/17 0907 Last data filed at 05/06/17 0500  Gross per 24 hour  Intake             1530 ml  Output             3925 ml  Net             -2395 ml   Filed Weights   05/02/17 2133 05/03/17 0236 05/05/17 0500  Weight: 90.7 kg (200 lb) 86.5 kg (190 lb 11.2 oz) 92.4 kg (203 lb 11.3 oz)    Examination:  General exam: NAD Respiratory system: Clear to auscultation. Respiratory effort normal. Cardiovascular system: S1 & S2 heard, RRR. No JVD, murmurs, rubs, gallops or clicks. 1+ pedal edema. Gastrointestinal system: Abdomen is nondistended, soft and nontender. No organomegaly or masses felt. Normal bowel sounds heard. Central nervous system: No focal neurological deficits. Extremities: Symmetric 5 x 5 power. Skin: No rashes, lesions or ulcers Psychiatry: awake, calm, appropriate    Data Reviewed: I have personally reviewed following labs and imaging studies  CBC:  Recent Labs Lab 05/02/17 2146 05/03/17 0233 05/05/17 0520  WBC 3.6* 3.3* 4.6  NEUTROABS 1.5*  --   --   HGB 10.2* 11.2* 10.8*  HCT 29.0* 33.0* 32.7*  MCV 92.9 94.3 96.5  PLT 194 192 190   Basic Metabolic Panel:  Recent Labs Lab 05/02/17 2146 05/03/17 0233 05/04/17 0531 05/05/17 0520 05/06/17 0451  NA 138 142 140 139 141  K 3.2* 3.5 3.8 3.8 4.0  CL 103 110 103 100* 101  CO2 26 25 31  35* 33*  GLUCOSE 125* 124* 96 92 105*  BUN 10 7 6 7  5*  CREATININE 0.99 0.79 0.71 0.72 0.66  CALCIUM 8.2* 7.8* 8.2* 8.0* 8.5*   GFR: Estimated Creatinine Clearance: 136.5 mL/min (by C-G formula based on SCr of 0.66 mg/dL). Liver Function Tests:  Recent Labs Lab 05/02/17 2146  AST 41  ALT 32  ALKPHOS 51  BILITOT 0.3  PROT 5.7*  ALBUMIN 3.1*   No results for input(s): LIPASE, AMYLASE in the last 168 hours.  Recent Labs Lab 05/03/17 0233 05/04/17 1228 05/06/17 0451  AMMONIA 60* 79* 67*   Coagulation Profile: No results for input(s): INR, PROTIME in the last 168 hours. Cardiac Enzymes:  Recent Labs Lab 05/02/17 2146 05/03/17 0233 05/03/17 0650 05/03/17 1245  CKTOTAL 552*  --   --   --   TROPONINI <0.03 <0.03 <0.03 <0.03   BNP (last 3  results) No results for input(s): PROBNP in the last 8760 hours. HbA1C: No results for input(s): HGBA1C in the last 72 hours. CBG: No results for input(s): GLUCAP in the last 168 hours. Lipid Profile: No results for input(s): CHOL, HDL, LDLCALC, TRIG, CHOLHDL, LDLDIRECT in the last 72 hours. Thyroid Function Tests: No results for input(s): TSH, T4TOTAL, FREET4, T3FREE, THYROIDAB in the last 72 hours. Anemia Panel:  Recent Labs  05/05/17 0955  VITAMINB12 571   Sepsis Labs:  Recent Labs Lab 05/02/17 2146 05/04/17 0531 05/06/17 0451  PROCALCITON <0.10 <0.10 <0.10  LATICACIDVEN 0.6  --   --     Recent Results (from the past 240 hour(s))  Blood Culture (routine x 2)     Status: None (Preliminary result)   Collection Time: 05/02/17 10:15 PM  Result Value Ref Range Status   Specimen Description BLOOD LEFT HAND  Final   Special Requests Blood Culture adequate volume  Final   Culture NO GROWTH 4 DAYS  Final   Report Status PENDING  Incomplete  Blood Culture (routine x 2)     Status: None (Preliminary result)   Collection Time: 05/02/17 10:22 PM  Result Value Ref Range Status   Specimen Description BLOOD LEFT HAND  Final   Special Requests   Final    BOTTLES DRAWN AEROBIC AND ANAEROBIC Blood Culture adequate volume   Culture NO GROWTH 4 DAYS  Final   Report Status PENDING  Incomplete  MRSA PCR Screening     Status: None   Collection Time: 05/03/17 12:57 AM  Result Value Ref Range Status   MRSA by PCR NEGATIVE NEGATIVE Final    Comment:        The GeneXpert MRSA Assay (FDA approved for NASAL specimens only), is one component of a comprehensive MRSA colonization surveillance program. It is not intended to diagnose MRSA infection nor to guide or monitor treatment for MRSA infections.          Radiology Studies: No results found.      Scheduled Meds: . divalproex  2,000 mg Oral QHS  . enoxaparin (LOVENOX) injection  40 mg Subcutaneous Q24H  . escitalopram   20 mg Oral BH-q7a  . furosemide  20 mg Intravenous BID  . gabapentin  300 mg Oral TID  . hydrOXYzine  50 mg Oral TID  . ipratropium-albuterol  3 mL Nebulization TID  . ketorolac  30 mg Intravenous Q6H  . lactulose  30 g Oral TID  . mometasone-formoterol  2 puff Inhalation BID  . pantoprazole  40 mg Oral Daily  . primidone  250 mg Oral TID  . QUEtiapine  200 mg Oral QHS   Continuous Infusions:    LOS: 3 days    Time spent:    MEMON,JEHANZEB, MD [facility] Pager [phone]  If 7PM-7AM, please contact night-coverage [email or web address] Password [email or web address] 05/06/2017, 9:07 AM

## 2017-05-06 NOTE — Plan of Care (Signed)
Problem: Activity: Goal: Risk for activity intolerance will decrease Outcome: Progressing PT ABLE TO GET UP TO BSC W/ ASSIST X1  Problem: Fluid Volume: Goal: Ability to maintain a balanced intake and output will improve Outcome: Progressing PT IS EATING AND DRINKING WELL THE PAST 24HRS

## 2017-05-07 LAB — BASIC METABOLIC PANEL
Anion gap: 5 (ref 5–15)
BUN: 8 mg/dL (ref 6–20)
CHLORIDE: 97 mmol/L — AB (ref 101–111)
CO2: 36 mmol/L — AB (ref 22–32)
Calcium: 8.6 mg/dL — ABNORMAL LOW (ref 8.9–10.3)
Creatinine, Ser: 0.6 mg/dL — ABNORMAL LOW (ref 0.61–1.24)
GFR calc Af Amer: >60 mL/min (ref >60)
GFR calc non Af Amer: >60 mL/min (ref >60)
Glucose, Bld: 98 mg/dL (ref 65–99)
Potassium: 3.8 mmol/L (ref 3.5–5.1)
Sodium: 138 mmol/L (ref 135–145)

## 2017-05-07 LAB — CULTURE, BLOOD (ROUTINE X 2)
Culture: NO GROWTH
Culture: NO GROWTH
SPECIAL REQUESTS: ADEQUATE
Special Requests: ADEQUATE

## 2017-05-07 LAB — CBC
HEMATOCRIT: 31.2 % — AB (ref 39.0–52.0)
HEMOGLOBIN: 10.5 g/dL — AB (ref 13.0–17.0)
MCH: 32.5 pg (ref 26.0–34.0)
MCHC: 33.7 g/dL (ref 30.0–36.0)
MCV: 96.6 fL (ref 78.0–100.0)
Platelets: 177 10*3/uL (ref 150–400)
RBC: 3.23 MIL/uL — ABNORMAL LOW (ref 4.22–5.81)
RDW: 15.4 % (ref 11.5–15.5)
WBC: 6.5 10*3/uL (ref 4.0–10.5)

## 2017-05-07 LAB — AMMONIA: Ammonia: 61 umol/L — ABNORMAL HIGH (ref 9–35)

## 2017-05-07 MED ORDER — NICOTINE POLACRILEX 2 MG MT GUM: 2.0000 mg | CHEWING_GUM | OROMUCOSAL | 0 refills | Status: DC | PRN

## 2017-05-07 MED ORDER — HYDROXYZINE HCL 50 MG PO TABS: 50.0000 mg | ORAL_TABLET | Freq: Three times a day (TID) | ORAL | 0 refills | Status: DC

## 2017-05-07 MED ORDER — LACTULOSE 10 GM/15ML PO SOLN: 30.0000 g | Freq: Two times a day (BID) | ORAL | 0 refills | Status: DC

## 2017-05-07 NOTE — Discharge Summary (Addendum)
Physician Discharge Summary  [name] ZOX:[id number] DOB: 04-11-1976 DOA: 05/02/2017  PCP: Vinnie Langton, NP  Admit date: 05/02/2017 Discharge date: 05/07/2017  Admitted From: Group home Disposition:  Group home  Recommendations for Outpatient Follow-up:  1. Follow up with PCP in 1-2 weeks 2. Please obtain BMP/CBC in one week 3. Norvasc was held on admission and blood pressures have been normotensive. We'll continue to hold further Norvasc for now. It can be restarted as an outpatient as needed.  Home Health: Equipment/Devices:Walker  Discharge Condition:Stable CODE STATUS:Full code Diet recommendation: Heart Healthy   Brief/Interim Summary: 41 year old male who is a resident of a group home with a history of seizure disorder, alcohol abuse in the past, was brought to the emergency room when he was found to be increasingly lethargic. He was initially found to be hypothermic and there was concern for underlying sepsis. He was admitted to the hospital for further evaluation  Discharge Diagnoses:  Principal Problem:   Acute encephalopathy Active Problems:   Seizure disorder (HCC) and pseudoseizures   HTN (hypertension)   Borderline personality disorder   Alcohol use disorder, severe, in controlled environment (HCC)   Altered mental state   Chronic respiratory failure with hypoxia (HCC)   COPD (chronic obstructive pulmonary disease) (HCC)   Pedal edema  1. Acute encephalopathy. Patient presented to the hospital with increasing lethargy and was difficult to arouse. Etiology of his encephalopathy was felt to be multifactorial. He did have some metabolic abnormalities including mildly elevated ammonia as well as mildly elevated PCO2. Follow up PCO2 was in normal range, but ammonia remained elevated. He was treated with lactulose lactulose and ammonia has started to improve. Will continue lactulose. EEG did not show any epileptiform discharges. Other causes of encephalopathy felt to  be related to polypharmacy. Neurology was consulted. His hydroxyzine dose was decreased to 50 mg 3 times a day and trazodone was discontinued. He is continued on his other sedative medications. Overall his mental status appears to be back to baseline. He is awake, alert and conversing appropriately in conversation.  2. Possible sepsis. Patient was noted to be hypothermic on arrival. He was started on intravenous antibiotics. Blood cultures showed no growth. Hypothermia has resolved and hemodynamics have been stable. Unlikely this was infection related. Antibiotics were discontinued. 3. Pedal edema. Echocardiogram is unremarkable. BNP also normal range. Venous Dopplers negative for DVT bilaterally. Started on Lasix with good urine output. Possibly venous stasis. Volume status is -7.5L. Edema has resolved. Continue on home dose of Lasix. 4. COPD with chronic respiratory failure. He is chronically on 2 L. No evidence of wheezing at this time. Continue current treatments. 5. Seizure disorder. Chronically on Depakote. EEG did not show any epileptiform discharges 6. History of alcohol use. Currently no alcohol use since he's been in a group home.  Discharge Instructions  Discharge Instructions    Diet - low sodium heart healthy    Complete by:  As directed    Increase activity slowly    Complete by:  As directed    Walker rolling    Complete by:  As directed      Allergies as of 05/07/2017      Reactions   Penicillins Nausea And Vomiting   Has patient had a PCN reaction causing immediate rash, facial/tongue/throat swelling, SOB or lightheadedness with hypotension:No Has patient had a PCN reaction causing severe rash involving mucus membranes or skin necrosis:No Has patient had a PCN reaction that required hospitalization:No Has patient had a PCN  reaction occurring within the last 10 years:NO If all of the above answers are "NO", then may proceed with Cephalosporin use.      Medication List     STOP taking these medications   amLODipine 10 MG tablet Commonly known as:  NORVASC   loperamide 2 MG capsule Commonly known as:  IMODIUM   predniSONE 10 MG tablet Commonly known as:  DELTASONE   sulfamethoxazole-trimethoprim 800-160 MG tablet Commonly known as:  BACTRIM DS,SEPTRA DS   traZODone 150 MG tablet Commonly known as:  DESYREL     TAKE these medications   albuterol 108 (90 Base) MCG/ACT inhaler Commonly known as:  PROVENTIL HFA;VENTOLIN HFA Inhale 2 puffs into the lungs every 4 (four) hours as needed for wheezing or shortness of breath.   cyclobenzaprine 10 MG tablet Commonly known as:  FLEXERIL Take 10 mg by mouth every 8 (eight) hours as needed for muscle spasms.   disulfiram 250 MG tablet Commonly known as:  ANTABUSE Take 250 mg by mouth daily.   divalproex 500 MG 24 hr tablet Commonly known as:  DEPAKOTE ER Take 4 tablets (2,000 mg total) by mouth at bedtime.   escitalopram 20 MG tablet Commonly known as:  LEXAPRO Take 20 mg by mouth every morning.   esomeprazole 40 MG capsule Commonly known as:  NEXIUM Take 40 mg by mouth daily at 12 noon.   ferrous sulfate 325 (65 FE) MG tablet Take 1 tablet (325 mg total) by mouth 2 (two) times daily with a meal.   Fluticasone-Salmeterol 250-50 MCG/DOSE Aepb Commonly known as:  ADVAIR Inhale 1 puff into the lungs 2 (two) times daily.   furosemide 20 MG tablet Commonly known as:  LASIX Take 1 tablet (20 mg total) by mouth daily.   gabapentin 300 MG capsule Commonly known as:  NEURONTIN Take 300 mg by mouth 3 (three) times daily.   hydrOXYzine 50 MG tablet Commonly known as:  ATARAX/VISTARIL Take 1 tablet (50 mg total) by mouth 3 (three) times daily. What changed:  how much to take   ibuprofen 800 MG tablet Commonly known as:  ADVIL,MOTRIN Take 800 mg by mouth 3 (three) times daily as needed for moderate pain.   lactulose 10 GM/15ML solution Commonly known as:  CHRONULAC Take 45 mLs (30 g total) by  mouth 2 (two) times daily.   Melatonin 5 MG Tabs Take 10 mg by mouth at bedtime.   nicotine polacrilex 2 MG gum Commonly known as:  NICORETTE Take 1 each (2 mg total) by mouth as needed for smoking cessation.   primidone 250 MG tablet Commonly known as:  MYSOLINE Take 250 mg by mouth 3 (three) times daily.   QUEtiapine 200 MG tablet Commonly known as:  SEROQUEL Take 200 mg by mouth at bedtime.   THEREMS-M Tabs Take 1 tablet by mouth daily.      Contact information for after-discharge care    Destination    [facility] .   Specialty:  Group Home Contact information: [address] [place] [address] [phone]             Allergies  Allergen Reactions  . Penicillins Nausea And Vomiting    Has patient had a PCN reaction causing immediate rash, facial/tongue/throat swelling, SOB or lightheadedness with hypotension:No Has patient had a PCN reaction causing severe rash involving mucus membranes or skin necrosis:No Has patient had a PCN reaction that required hospitalization:No Has patient had a PCN reaction occurring within  the last 10 years:NO If all of the above answers are "NO", then may proceed with Cephalosporin use.      Consultations:  Neurology   Procedures/Studies: Ct Head Wo Contrast  Result Date: 05/02/2017 CLINICAL DATA:  Patient fell at assisted living facility. Lethargy. Altered mental status. EXAM: CT HEAD WITHOUT CONTRAST CT CERVICAL SPINE WITHOUT CONTRAST TECHNIQUE: Multidetector CT imaging of the head and cervical spine was performed following the standard protocol without intravenous contrast. Multiplanar CT image reconstructions of the cervical spine were also generated. COMPARISON:  None. FINDINGS: CT HEAD FINDINGS Brain: Mild superficial atrophy. No acute intracranial hemorrhage, midline shift or edema. No large vascular territory infarction. No intra-axial mass. No extra-axial fluid collections. Vascular: No  hyperdense vessel or unexpected calcification. Skull: Normal. Negative for fracture or focal lesion. Sinuses/Orbits: No acute finding. Other: None. CT CERVICAL SPINE FINDINGS Alignment: Reversal cervical lordosis which may be due to muscle spasm or positioning. Minimal grade 1 anterolisthesis C3 on C4. Craniocervical relationship and atlantodental interval are intact and in normal alignment. Skull base and vertebrae: No acute fracture. No primary bone lesion or focal pathologic process. Soft tissues and spinal canal: No prevertebral fluid or swelling. No visible canal hematoma. Disc levels: Normal no central canal stenosis or significant neural foraminal encroachment. No jumped appearing facets. No focal disc herniations. Upper chest: Mild left apical pleural thickening. Other: None IMPRESSION: 1. Mild superficial atrophy for age. No acute intracranial abnormality noted. 2. No acute cervical spine fracture. 3. Minimal 1 mm of grade 1 anterolisthesis of C3 on C4. Electronically Signed   By: Tollie Eth M.D.   On: 05/02/2017 23:16   Ct Angio Chest Pe W Or Wo Contrast  Result Date: 04/22/2017 CLINICAL DATA:  Patient with bilateral lower extremity swelling and shortness of breath. EXAM: CT ANGIOGRAPHY CHEST WITH CONTRAST TECHNIQUE: Multidetector CT imaging of the chest was performed using the standard protocol during bolus administration of intravenous contrast. Multiplanar CT image reconstructions and MIPs were obtained to evaluate the vascular anatomy. CONTRAST:  70 cc Isovue 370 COMPARISON:  Chest radiograph 04/22/2017 FINDINGS: Cardiovascular: Normal heart size. Coronary arterial vascular calcifications. Aorta and main pulmonary artery are normal in caliber. No filling defects identified within the pulmonary arterial system to suggest acute pulmonary embolus. Mediastinum/Nodes: No enlarged axillary, mediastinal or hilar lymphadenopathy. Small hiatal hernia. Lungs/Pleura: Central airways are patent. Subpleural  peripheral consolidation within the lingula, left lower and right lower lobes. Small bilateral pleural effusions, left-greater-than-right. No pneumothorax. Upper Abdomen: Fatty deposition adjacent to the falciform ligament. Gallbladder is unremarkable. Musculoskeletal: Thoracic spine degenerative changes. No aggressive or acute appearing osseous lesions. Healing anterior left rib fractures. Review of the MIP images confirms the above findings. IMPRESSION: No evidence for pulmonary embolus. Peripheral subpleural consolidation within the lingula, left lower lobe and right lower lobes. Findings may represent multifocal pneumonia in the appropriate clinical setting. Recommend follow-up chest radiograph in 6- 8 weeks to assess for interval resolution. Small bilateral pleural effusions, left-greater-than-right. Electronically Signed   By: Annia Belt M.D.   On: 04/22/2017 16:04   Ct Cervical Spine Wo Contrast  Result Date: 05/02/2017 CLINICAL DATA:  Patient fell at assisted living facility. Lethargy. Altered mental status. EXAM: CT HEAD WITHOUT CONTRAST CT CERVICAL SPINE WITHOUT CONTRAST TECHNIQUE: Multidetector CT imaging of the head and cervical spine was performed following the standard protocol without intravenous contrast. Multiplanar CT image reconstructions of the cervical spine were also generated. COMPARISON:  None. FINDINGS: CT HEAD FINDINGS Brain: Mild superficial atrophy. No acute  intracranial hemorrhage, midline shift or edema. No large vascular territory infarction. No intra-axial mass. No extra-axial fluid collections. Vascular: No hyperdense vessel or unexpected calcification. Skull: Normal. Negative for fracture or focal lesion. Sinuses/Orbits: No acute finding. Other: None. CT CERVICAL SPINE FINDINGS Alignment: Reversal cervical lordosis which may be due to muscle spasm or positioning. Minimal grade 1 anterolisthesis C3 on C4. Craniocervical relationship and atlantodental interval are intact and in  normal alignment. Skull base and vertebrae: No acute fracture. No primary bone lesion or focal pathologic process. Soft tissues and spinal canal: No prevertebral fluid or swelling. No visible canal hematoma. Disc levels: Normal no central canal stenosis or significant neural foraminal encroachment. No jumped appearing facets. No focal disc herniations. Upper chest: Mild left apical pleural thickening. Other: None IMPRESSION: 1. Mild superficial atrophy for age. No acute intracranial abnormality noted. 2. No acute cervical spine fracture. 3. Minimal 1 mm of grade 1 anterolisthesis of C3 on C4. Electronically Signed   By: Tollie Ethavid  Kwon M.D.   On: 05/02/2017 23:16   [place] Venous Img Lower Bilateral  Result Date: 05/03/2017 CLINICAL DATA:  Bilateral pedal edema and dyspnea. EXAM: BILATERAL LOWER EXTREMITY VENOUS DOPPLER ULTRASOUND TECHNIQUE: Gray-scale sonography with graded compression, as well as color Doppler and duplex ultrasound were performed to evaluate the lower extremity deep venous systems from the level of the common femoral vein and including the common femoral, femoral, profunda femoral, popliteal and calf veins including the posterior tibial, peroneal and gastrocnemius veins when visible. The superficial great saphenous vein was also interrogated. Spectral Doppler was utilized to evaluate flow at rest and with distal augmentation maneuvers in the common femoral, femoral and popliteal veins. COMPARISON:  None. FINDINGS: RIGHT LOWER EXTREMITY Common Femoral Vein: No evidence of thrombus. Normal compressibility, respiratory phasicity and response to augmentation. Saphenofemoral Junction: No evidence of thrombus. Normal compressibility and flow on color Doppler imaging. Profunda Femoral Vein: No evidence of thrombus. Normal compressibility and flow on color Doppler imaging. Femoral Vein: No evidence of thrombus. Normal compressibility, respiratory phasicity and response to augmentation. Popliteal Vein: No  evidence of thrombus. Normal compressibility, respiratory phasicity and response to augmentation. Calf Veins: No evidence of thrombus. Normal compressibility and flow on color Doppler imaging. Superficial Great Saphenous Vein: No evidence of thrombus. Normal compressibility and flow on color Doppler imaging. Venous Reflux:  None. Other Findings:  None. LEFT LOWER EXTREMITY Common Femoral Vein: No evidence of thrombus. Normal compressibility, respiratory phasicity and response to augmentation. Saphenofemoral Junction: No evidence of thrombus. Normal compressibility and flow on color Doppler imaging. Profunda Femoral Vein: No evidence of thrombus. Normal compressibility and flow on color Doppler imaging. Femoral Vein: No evidence of thrombus. Normal compressibility, respiratory phasicity and response to augmentation. Popliteal Vein: No evidence of thrombus. Normal compressibility, respiratory phasicity and response to augmentation. Calf Veins: No evidence of thrombus. Normal compressibility and flow on color Doppler imaging. Superficial Great Saphenous Vein: No evidence of thrombus. Normal compressibility and flow on color Doppler imaging. Venous Reflux:  None. Other Findings:  Subcutaneous edema is seen in the bilateral calves. IMPRESSION: No evidence of DVT within either lower extremity. Subcutaneous edema in the bilateral calves. Electronically Signed   By: Delbert PhenixJason A Poff M.D.   On: 05/03/2017 11:38   Dg Chest Port 1 View  Result Date: 05/06/2017 CLINICAL DATA:  Fever EXAM: PORTABLE CHEST 1 VIEW COMPARISON:  05/02/2017 FINDINGS: Diffuse bilateral airspace disease unchanged, probable pulmonary edema. Mild bibasilar atelectasis also unchanged. Small left effusion unchanged. IMPRESSION: No significant interval change.  Probable congestive heart failure with edema and left effusion. Electronically Signed   By: Marlan Palau M.D.   On: 05/06/2017 11:10   Dg Chest Portable 1 View  Result Date: 05/02/2017 CLINICAL  DATA:  Low back pain, unresponsive EXAM: PORTABLE CHEST 1 VIEW COMPARISON:  04/22/2017 FINDINGS: There are small bilateral pleural effusions. Borderline cardiomegaly. Increased central vascular congestion and diffuse interstitial and alveolar opacity which may reflect edema or pneumonia. No pneumothorax. IMPRESSION: Bilateral pleural effusions with increasing bilateral interstitial and alveolar edema or infiltrates. Electronically Signed   By: Jasmine Pang M.D.   On: 05/02/2017 22:33   Dg Chest Port 1 View  Result Date: 04/22/2017 CLINICAL DATA:  Patient with shortness of breath and lethargy. EXAM: PORTABLE CHEST 1 VIEW COMPARISON:  Chest radiograph 04/03/2017 FINDINGS: Monitoring leads overlie the patient. Stable cardiac and mediastinal contours. Bilateral perihilar interstitial pulmonary opacities and pulmonary vascular redistribution. Small bilateral pleural effusions. No pneumothorax. IMPRESSION: Perihilar interstitial opacities suggestive of mild interstitial edema. Small bilateral pleural effusions, left-greater-than-right. Electronically Signed   By: Annia Belt M.D.   On: 04/22/2017 14:29    Echo: Left ventricle: The cavity size was normal. Wall thickness was   increased in a pattern of mild LVH. Systolic function was normal.   The estimated ejection fraction was in the range of 60% to 65%.   Wall motion was normal; there were no regional wall motion   abnormalities. Left ventricular diastolic function parameters   were normal. - Mitral valve: There was mild regurgitation. - Systemic veins: IVC is dilated with normal respiratory variation.   Estimated right atrial pressure 8 mmHg.  EEG: This recording shows mild global slowing indicating a global encephalopathy. However, there is no epileptiform activities observed.   Subjective: Feeling better. Pain in lower extremity improving. He is awake and alert. No fever overnight.  Discharge Exam: Vitals:   05/06/17 2137 05/07/17 0537  BP:  125/62 (!) 101/51  Pulse: 81 65  Resp: 18 18  Temp: 98.6 F (37 C) 98.5 F (36.9 C)   Vitals:   05/06/17 2137 05/07/17 0537 05/07/17 0749 05/07/17 0758  BP: 125/62 (!) 101/51    Pulse: 81 65    Resp: 18 18    Temp: 98.6 F (37 C) 98.5 F (36.9 C)    TempSrc: Oral Oral    SpO2: 100% 100% 98% 98%  Weight:      Height:        General: Pt is alert, awake, not in acute distress Cardiovascular: RRR, S1/S2 +, no rubs, no gallops Respiratory: CTA bilaterally, no wheezing, no rhonchi Abdominal: Soft, NT, ND, bowel sounds + Extremities: trace edema, no cyanosis    The results of significant diagnostics from this hospitalization (including imaging, microbiology, ancillary and laboratory) are listed below for reference.     Microbiology: Recent Results (from the past 240 hour(s))  Blood Culture (routine x 2)     Status: None   Collection Time: 05/02/17 10:15 PM  Result Value Ref Range Status   Specimen Description BLOOD LEFT HAND  Final   Special Requests Blood Culture adequate volume  Final   Culture NO GROWTH 5 DAYS  Final   Report Status 05/07/2017 FINAL  Final  Blood Culture (routine x 2)     Status: None   Collection Time: 05/02/17 10:22 PM  Result Value Ref Range Status   Specimen Description BLOOD LEFT HAND  Final   Special Requests   Final    BOTTLES DRAWN AEROBIC AND  ANAEROBIC Blood Culture adequate volume   Culture NO GROWTH 5 DAYS  Final   Report Status 05/07/2017 FINAL  Final  MRSA PCR Screening     Status: None   Collection Time: 05/03/17 12:57 AM  Result Value Ref Range Status   MRSA by PCR NEGATIVE NEGATIVE Final    Comment:        The GeneXpert MRSA Assay (FDA approved for NASAL specimens only), is one component of a comprehensive MRSA colonization surveillance program. It is not intended to diagnose MRSA infection nor to guide or monitor treatment for MRSA infections.      Labs: BNP (last 3 results)  Recent Labs  04/22/17 1400 05/02/17 2146   BNP 39.0 40.0   Basic Metabolic Panel:  Recent Labs Lab 05/03/17 0233 05/04/17 0531 05/05/17 0520 05/06/17 0451 05/07/17 0552  NA 142 140 139 141 138  K 3.5 3.8 3.8 4.0 3.8  CL 110 103 100* 101 97*  CO2 25 31 35* 33* 36*  GLUCOSE 124* 96 92 105* 98  BUN 7 6 7  5* 8  CREATININE 0.79 0.71 0.72 0.66 0.60*  CALCIUM 7.8* 8.2* 8.0* 8.5* 8.6*   Liver Function Tests:  Recent Labs Lab 05/02/17 2146  AST 41  ALT 32  ALKPHOS 51  BILITOT 0.3  PROT 5.7*  ALBUMIN 3.1*   No results for input(s): LIPASE, AMYLASE in the last 168 hours.  Recent Labs Lab 05/03/17 0233 05/04/17 1228 05/06/17 0451 05/07/17 0553  AMMONIA 60* 79* 67* 61*   CBC:  Recent Labs Lab 05/02/17 2146 05/03/17 0233 05/05/17 0520 05/07/17 0552  WBC 3.6* 3.3* 4.6 6.5  NEUTROABS 1.5*  --   --   --   HGB 10.2* 11.2* 10.8* 10.5*  HCT 29.0* 33.0* 32.7* 31.2*  MCV 92.9 94.3 96.5 96.6  PLT 194 192 190 177   Cardiac Enzymes:  Recent Labs Lab 05/02/17 2146 05/03/17 0233 05/03/17 0650 05/03/17 1245  CKTOTAL 552*  --   --   --   TROPONINI <0.03 <0.03 <0.03 <0.03   BNP: Invalid input(s): POCBNP CBG: No results for input(s): GLUCAP in the last 168 hours. D-Dimer No results for input(s): DDIMER in the last 72 hours. Hgb A1c No results for input(s): HGBA1C in the last 72 hours. Lipid Profile No results for input(s): CHOL, HDL, LDLCALC, TRIG, CHOLHDL, LDLDIRECT in the last 72 hours. Thyroid function studies No results for input(s): TSH, T4TOTAL, T3FREE, THYROIDAB in the last 72 hours.  Invalid input(s): FREET3 Anemia work up  Recent Labs  05/05/17 0955  VITAMINB12 571   Urinalysis    Component Value Date/Time   COLORURINE COLORLESS (A) 05/06/2017 0906   APPEARANCEUR CLEAR 05/06/2017 0906   APPEARANCEUR Clear 09/11/2014 2308   LABSPEC 1.004 (L) 05/06/2017 0906   LABSPEC 1.003 09/11/2014 2308   PHURINE 6.0 05/06/2017 0906   GLUCOSEU NEGATIVE 05/06/2017 0906   GLUCOSEU Negative  09/11/2014 2308   HGBUR SMALL (A) 05/06/2017 0906   BILIRUBINUR NEGATIVE 05/06/2017 0906   BILIRUBINUR Negative 09/11/2014 2308   KETONESUR NEGATIVE 05/06/2017 0906   PROTEINUR NEGATIVE 05/06/2017 0906   NITRITE NEGATIVE 05/06/2017 0906   LEUKOCYTESUR NEGATIVE 05/06/2017 0906   LEUKOCYTESUR Negative 09/11/2014 2308   Sepsis Labs Invalid input(s): PROCALCITONIN,  WBC,  LACTICIDVEN Microbiology Recent Results (from the past 240 hour(s))  Blood Culture (routine x 2)     Status: None   Collection Time: 05/02/17 10:15 PM  Result Value Ref Range Status   Specimen Description BLOOD LEFT HAND  Final   Special Requests Blood Culture adequate volume  Final   Culture NO GROWTH 5 DAYS  Final   Report Status 05/07/2017 FINAL  Final  Blood Culture (routine x 2)     Status: None   Collection Time: 05/02/17 10:22 PM  Result Value Ref Range Status   Specimen Description BLOOD LEFT HAND  Final   Special Requests   Final    BOTTLES DRAWN AEROBIC AND ANAEROBIC Blood Culture adequate volume   Culture NO GROWTH 5 DAYS  Final   Report Status 05/07/2017 FINAL  Final  MRSA PCR Screening     Status: None   Collection Time: 05/03/17 12:57 AM  Result Value Ref Range Status   MRSA by PCR NEGATIVE NEGATIVE Final    Comment:        The GeneXpert MRSA Assay (FDA approved for NASAL specimens only), is one component of a comprehensive MRSA colonization surveillance program. It is not intended to diagnose MRSA infection nor to guide or monitor treatment for MRSA infections.      Time coordinating discharge: Over 30 minutes  SIGNED:   Erick Blinks, MD  [facility] 05/07/2017, 10:54 AM Pager   If 7PM-7AM, please contact night-coverage [email or web address] Password [email or web address]

## 2017-05-07 NOTE — Clinical Social Work Note (Signed)
Pt is ready for discharge today and will [facility]. CSW called [phone] and spoke with Chrissie [facility]William [facility] Worker) and Elease HashimotoPatricia Museum/gallery exhibitions officer(Supervisor) to confirm pt's return to facility. Elease Hashimotoatricia asked that signed FL-2 and discharge summary be with pt upon pick-up. Chrissie NoaWilliam confirmed he would be to hospital in 30-45 minutes to pick up pt. CSW updated RN Shanda BumpsJessica. CSW left update on voicemail of DSS Guardian-Meghan at [phone]. CSW paged Dr. Kerry HoughMemon to request FL-2 be signed. CSW is signing off as no further needs identified.  Corlis HoveJeneya Razi Hickle, LCSWA, LCASA Clinical Social Worker [phone](Weekend) [phone]

## 2017-05-07 NOTE — [place] FL2 (Signed)
[place] MEDICAID FL2 LEVEL OF CARE SCREENING TOOL     IDENTIFICATION  Patient Name: [name] Birthdate: [birth year] Sex: male Admission Date (Current Location): 05/02/2017  [facility] and [place] Number:  [employer] and Address:  [facility],  [address], [place] [address]      Provider Number: [id number]  Attending Physician Name and Address:  Erick Blinks, MD  Relative Name and Phone Number:       Current Level of Care: Hospital Recommended Level of Care: [facility] Prior Approval Number:    Date Approved/Denied:   PASRR Number:    Discharge Plan: Domiciliary (Rest home)    Current Diagnoses: Patient Active Problem List   Diagnosis Date Noted  . Chronic respiratory failure with hypoxia (HCC) 05/03/2017  . COPD (chronic obstructive pulmonary disease) (HCC) 05/03/2017  . Pedal edema 05/03/2017  . Altered mental state 05/02/2017  . Acute on chronic respiratory failure with hypoxia (HCC) 04/05/2017  . COPD exacerbation (HCC) 04/03/2017  . Musculoskeletal pain 06/28/2016  . Left flank pain   . Hyponatremia 06/27/2016  . Acute encephalopathy 01/06/2016  . 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 Pleural effusion, right 12/31/2015  . GERD (gastroesophageal reflux disease) 11/11/2015  . Borderline personality disorder 11/11/2015  . Alcohol use disorder, severe, in controlled environment (HCC) 11/11/2015  . 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 Tobacco abuse 11/05/2013  . HTN (hypertension) 11/05/2013    Orientation RESPIRATION BLADDER Height & Weight     Self, Time, Situation, Place  Normal External catheter Weight: 203 lb 11.3 oz (92.4 kg) Height:  5\' 9"  (175.3 cm)  BEHAVIORAL SYMPTOMS/MOOD NEUROLOGICAL BOWEL NUTRITION STATUS    Convulsions/Seizures (History of seizure disorder) Continent Diet (Heart  Healthy; thin fluids)  AMBULATORY STATUS COMMUNICATION OF NEEDS Skin   Independent Verbally Normal                       Personal Care Assistance Level of Assistance  Bathing, Feeding, Dressing Bathing Assistance: Independent Feeding assistance: Independent Dressing Assistance: Independent     Functional Limitations Info  Sight, Hearing, Speech Sight Info: Adequate Hearing Info: Adequate Speech Info: Adequate    SPECIAL CARE FACTORS FREQUENCY                       Contractures Contractures Info: Not present    Additional Factors Info  Code Status, Allergies, Psychotropic Code Status Info: Full Allergies Info: Penicillins Psychotropic Info: See MAR         Current Medications (05/07/2017):  This is the current hospital active medication list Current Facility-Administered Medications  Medication Dose Route Frequency Provider Last Rate Last Dose  . acetaminophen (TYLENOL) tablet 650 mg  650 mg Oral Q6H PRN Erick Blinks, MD   650 mg at 05/06/17 0930  . albuterol (PROVENTIL) (2.5 MG/3ML) 0.083% nebulizer solution 2.5 mg  2.5 mg Nebulization Q4H PRN Erick Blinks, MD      . cyclobenzaprine (FLEXERIL) tablet 10 mg  10 mg Oral Q8H PRN Erick Blinks, MD      . divalproex (DEPAKOTE ER) 24 hr tablet 2,000 mg  2,000 mg Oral QHS Erick Blinks, MD   2,000 mg at 05/06/17 2114  . enoxaparin (LOVENOX) injection 40 mg  40 mg Subcutaneous Q24H Tarry Kos A, MD   40 mg at 05/06/17 0935  . escitalopram (LEXAPRO)  tablet 20 mg  20 mg Oral Simon [id number]-[id number] Memon, Durward MallardJehanzeb, MD   20 mg at 05/07/17 [id number]  . furosemide (LASIX) injection 20 mg  20 mg Intravenous BID Erick BlinksMemon, Jehanzeb, MD   20 mg at 05/06/17 1616  . gabapentin (NEURONTIN) capsule 300 mg  300 mg Oral TID Erick BlinksMemon, Jehanzeb, MD   300 mg at 05/07/17 1026  . HYDROcodone-acetaminophen (NORCO/VICODIN) 5-325 MG per tablet 1 tablet  1 tablet Oral Q6H PRN Erick BlinksMemon, Jehanzeb, MD   1 tablet at 05/07/17 [id number]  . hydrOXYzine (ATARAX/VISTARIL)  tablet 50 mg  50 mg Oral TID Erick BlinksMemon, Jehanzeb, MD   50 mg at 05/07/17 1031  . ipratropium-albuterol (DUONEB) 0.5-2.5 (3) MG/3ML nebulizer solution 3 mL  3 mL Nebulization TID Erick BlinksMemon, Jehanzeb, MD   3 mL at 05/07/17 0749  . ketorolac (TORADOL) 30 MG/ML injection 30 mg  30 mg Intravenous Q6H Erick BlinksMemon, Jehanzeb, MD   30 mg at 05/07/17 0024  . lactulose (CHRONULAC) 10 GM/15ML solution 30 g  30 g Oral TID Erick BlinksMemon, Jehanzeb, MD   30 g at 05/07/17 1025  . mometasone-formoterol (DULERA) 200-5 MCG/ACT inhaler 2 puff  2 puff Inhalation BID Erick BlinksMemon, Jehanzeb, MD   2 puff at 05/07/17 0758  . nicotine polacrilex (NICORETTE) gum 2 mg  2 mg Oral PRN Erick BlinksMemon, Jehanzeb, MD      . ondansetron (ZOFRAN) tablet 4 mg  4 mg Oral Q6H PRN Haydee Monicaavid, Rachal A, MD       Or  . ondansetron (ZOFRAN) injection 4 mg  4 mg Intravenous Q6H PRN Tarry Kosavid, Rachal A, MD      . pantoprazole (PROTONIX) EC tablet 40 mg  40 mg Oral Daily Erick BlinksMemon, Jehanzeb, MD   40 mg at 05/07/17 1026  . primidone (MYSOLINE) tablet 250 mg  250 mg Oral TID Erick BlinksMemon, Jehanzeb, MD   250 mg at 05/07/17 1031  . QUEtiapine (SEROQUEL) tablet 200 mg  200 mg Oral QHS Erick BlinksMemon, Jehanzeb, MD   200 mg at 05/06/17 2115     Discharge Medications: Please see discharge summary for a list of discharge medications.  Relevant Imaging Results:  Relevant Lab Results:   Additional Information SSN: [id number]  Dominic PeaJeneya G Elajah Kunsman, LCSW

## 2017-05-07 NOTE — Progress Notes (Addendum)
Pt discharged to [facility] today per Dr. Kerry HoughMemon. Pt's VSS. Facility representative provided with home medication list, discharge instructions, FL2's and prescriptions. Verbalized understanding. Pt ambulated off floor in stable condition accompanied by NT.

## 2017-05-08 LAB — MISC LABCORP TEST (SEND OUT): LABCORP TEST CODE: 7856

## 2017-05-08 LAB — HOMOCYSTEINE: Homocysteine: 9.4 umol/L (ref 0.0–15.0)

## 2017-05-11 ENCOUNTER — Encounter (HOSPITAL_COMMUNITY): Payer: Self-pay | Admitting: Cardiology

## 2017-05-11 ENCOUNTER — Emergency Department (HOSPITAL_COMMUNITY)
Admission: EM | Admit: 2017-05-11 | Discharge: 2017-05-11 | Disposition: A | Discharge status: Home / Self Care | Payer: Medicare Other | Attending: Emergency Medicine | Admitting: Emergency Medicine

## 2017-05-11 DIAGNOSIS — I509 Heart failure, unspecified: Secondary | ICD-10-CM | POA: Diagnosis not present

## 2017-05-11 DIAGNOSIS — R600 Localized edema: Secondary | ICD-10-CM

## 2017-05-11 DIAGNOSIS — Z79899 Other long term (current) drug therapy: Secondary | ICD-10-CM | POA: Diagnosis not present

## 2017-05-11 DIAGNOSIS — I110 Hypertensive heart disease with heart failure: Secondary | ICD-10-CM | POA: Diagnosis not present

## 2017-05-11 DIAGNOSIS — F17210 Nicotine dependence, cigarettes, uncomplicated: Secondary | ICD-10-CM | POA: Insufficient documentation

## 2017-05-11 DIAGNOSIS — M7989 Other specified soft tissue disorders: Secondary | ICD-10-CM | POA: Diagnosis present

## 2017-05-11 DIAGNOSIS — J9611 Chronic respiratory failure with hypoxia: Secondary | ICD-10-CM | POA: Diagnosis not present

## 2017-05-11 DIAGNOSIS — J45909 Unspecified asthma, uncomplicated: Secondary | ICD-10-CM | POA: Insufficient documentation

## 2017-05-11 DIAGNOSIS — J449 Chronic obstructive pulmonary disease, unspecified: Secondary | ICD-10-CM | POA: Diagnosis not present

## 2017-05-11 LAB — CBC WITH DIFFERENTIAL/PLATELET
Basophils Absolute: 0.1 10*3/uL (ref 0.0–0.1)
Basophils Relative: 1 %
Eosinophils Absolute: 0.1 10*3/uL (ref 0.0–0.7)
Eosinophils Relative: 2 %
HEMATOCRIT: 33.9 % — AB (ref 39.0–52.0)
HEMOGLOBIN: 11.5 g/dL — AB (ref 13.0–17.0)
LYMPHS ABS: 1.4 10*3/uL (ref 0.7–4.0)
LYMPHS PCT: 25 %
MCH: 32.5 pg (ref 26.0–34.0)
MCHC: 33.9 g/dL (ref 30.0–36.0)
MCV: 95.8 fL (ref 78.0–100.0)
MONO ABS: 1 10*3/uL (ref 0.1–1.0)
MONOS PCT: 17 %
NEUTROS ABS: 3.2 10*3/uL (ref 1.7–7.7)
NEUTROS PCT: 55 %
Platelets: 241 10*3/uL (ref 150–400)
RBC: 3.54 MIL/uL — ABNORMAL LOW (ref 4.22–5.81)
RDW: 15.3 % (ref 11.5–15.5)
WBC: 5.8 10*3/uL (ref 4.0–10.5)

## 2017-05-11 LAB — URINALYSIS, ROUTINE W REFLEX MICROSCOPIC
Bacteria, UA: NONE SEEN
GLUCOSE, UA: NEGATIVE mg/dL
Hgb urine dipstick: NEGATIVE
Ketones, ur: 5 mg/dL — AB
Leukocytes, UA: NEGATIVE
NITRITE: NEGATIVE
PH: 5 (ref 5.0–8.0)
Protein, ur: 100 mg/dL — AB
SPECIFIC GRAVITY, URINE: 1.03 (ref 1.005–1.030)
Squamous Epithelial / LPF: NONE SEEN

## 2017-05-11 LAB — BASIC METABOLIC PANEL
Anion gap: 9 (ref 5–15)
BUN: 11 mg/dL (ref 6–20)
CHLORIDE: 104 mmol/L (ref 101–111)
CO2: 28 mmol/L (ref 22–32)
Calcium: 8.7 mg/dL — ABNORMAL LOW (ref 8.9–10.3)
Creatinine, Ser: 0.83 mg/dL (ref 0.61–1.24)
GFR calc Af Amer: >60 mL/min (ref >60)
GFR calc non Af Amer: >60 mL/min (ref >60)
GLUCOSE: 96 mg/dL (ref 65–99)
Potassium: 3.7 mmol/L (ref 3.5–5.1)
Sodium: 141 mmol/L (ref 135–145)

## 2017-05-11 MED ORDER — TRAMADOL HCL 50 MG PO TABS: 50.0000 mg | ORAL_TABLET | Freq: Four times a day (QID) | ORAL | 0 refills | Status: DC | PRN

## 2017-05-11 MED ORDER — HYDROCODONE-ACETAMINOPHEN 5-325 MG PO TABS
1.0000 | ORAL_TABLET | Freq: Once | ORAL | Status: AC
  Administered 2017-05-11: 1 via ORAL
  Filled 2017-05-11: qty 1

## 2017-05-11 MED ORDER — TRAMADOL HCL 50 MG PO TABS
50.0000 mg | ORAL_TABLET | Freq: Four times a day (QID) | ORAL | 0 refills | Status: DC | PRN
Start: 2017-05-11 — End: 2018-03-08

## 2017-05-11 MED ORDER — FUROSEMIDE 10 MG/ML IJ SOLN
40.0000 mg | Freq: Once | INTRAMUSCULAR | Status: AC
  Administered 2017-05-11: 40 mg via INTRAVENOUS
  Filled 2017-05-11: qty 4

## 2017-05-11 NOTE — ED Triage Notes (Signed)
Also c/o headache times one month.

## 2017-05-11 NOTE — ED Provider Notes (Addendum)
[facility]-EMER[facility]ENCY DEPT Provider Note   CSN: [id number] Arrival date & time: 05/11/17  1534     History   Chief Complaint Chief Complaint  Patient presents with  . Leg Swelling  . Back Pain    HPI [name] is a 41 y.o. male.  Patient complains of swelling to both legs with some discomfort. He has recently had an ultrasound that did not show any blood clots. He is on Lasix 20 mg a day    Leg Pain   This is a recurrent problem. The current episode started more than 2 days ago. The problem occurs constantly. The problem has not changed since onset.Pain location: Both lower extremities. The quality of the pain is described as aching. The pain is at a severity of 5/10. The pain is moderate.    Past Medical History:  Diagnosis Date  . Anxiety   . Asthma   . Depression   . Encephalopathy   . GERD (gastroesophageal reflux disease)   . Hypertension   . Hyponatremia 06/2016  . Renal disorder    kidney injury  . Seizures [facility])     Patient Active Problem List   Diagnosis Date Noted  . Chronic respiratory failure with hypoxia (HCC) 05/03/2017  . COPD (chronic obstructive pulmonary disease) (HCC) 05/03/2017  . Pedal edema 05/03/2017  . Altered mental state 05/02/2017  . Acute on chronic respiratory failure with hypoxia (HCC) 04/05/2017  . COPD exacerbation (HCC) 04/03/2017  . Musculoskeletal pain 06/28/2016  . Left flank pain   . Hyponatremia 06/27/2016  . Acute encephalopathy 01/06/2016  . 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 Pleural effusion, right 12/31/2015  . GERD (gastroesophageal reflux disease) 11/11/2015  . Borderline personality disorder 11/11/2015  . Alcohol use disorder, severe, in controlled environment (HCC) 11/11/2015  . 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 Tobacco abuse 11/05/2013  . HTN (hypertension) 11/05/2013    Past  Surgical History:  Procedure Laterality Date  . FINGER SURGERY Left    5th digit  . PLEURAL EFFUSION DRAINAGE Right 01/02/2016   Procedure: DRAINAGE OF PLEURAL EFFUSION;  Surgeon: Loreli Slot, MD;  Location[facility] OR;  Service: Thoracic;  Laterality: Right;  Marland Kitchen VIDEO ASSISTED THORACOSCOPY (VATS)/DECORTICATION Right 01/02/2016   Procedure: VIDEO ASSISTED THORACOSCOPY (VATS)/DECORTICATION;  Surgeon: Loreli Slot, MD;  Location: [facility] OR;  Service: Thoracic;  Laterality: Right;  Marland Kitchen VIDEO BRONCHOSCOPY N/A 01/02/2016   Procedure: VIDEO BRONCHOSCOPY;  Surgeon: Loreli Slot, MD;  Location: [facility] OR;  Service: Thoracic;  Laterality: N/A;       Home Medications    Prior to Admission medications   Medication Sig Start Date End Date Taking? Authorizing Provider  albuterol (PROVENTIL HFA;VENTOLIN HFA) 108 (90 Base) MCG/ACT inhaler Inhale 2 puffs into the lungs every 4 (four) hours as needed for wheezing or shortness of breath.   Yes [provider]  cyclobenzaprine (FLEXERIL) 10 MG tablet Take 10 mg by mouth every 8 (eight) hours as needed for muscle spasms.   Yes [provider]  disulfiram (ANTABUSE) 250 MG tablet Take 250 mg by mouth daily.   Yes [provider]  divalproex (DEPAKOTE ER) 500 MG 24 hr tablet Take 4 tablets (2,000 mg total) by mouth at bedtime. 11/13/15  Yes Jimmy Footman, MD  escitalopram (LEXAPRO) 20 MG tablet Take 20 mg by mouth every morning.   Yes [provider]  esomeprazole (NEXIUM)  40 MG capsule Take 40 mg by mouth daily at 12 noon.   Yes [provider]  ferrous sulfate 325 (65 FE) MG tablet Take 1 tablet (325 mg total) by mouth 2 (two) times daily with a meal. 01/11/16  Yes Regalado, Belkys A, MD  Fluticasone-Salmeterol (ADVAIR) 250-50 MCG/DOSE AEPB Inhale 1 puff into the lungs 2 (two) times daily.   Yes [provider]  furosemide (LASIX) 20 MG tablet Take 1 tablet (20 mg total) by mouth daily.  04/22/17  Yes Jacalyn Lefevre, MD  gabapentin (NEURONTIN) 300 MG capsule Take 300 mg by mouth 3 (three) times daily.   Yes [provider]  hydrOXYzine (ATARAX/VISTARIL) 50 MG tablet Take 1 tablet (50 mg total) by mouth 3 (three) times daily. 05/07/17  Yes Erick Blinks, MD  ibuprofen (ADVIL,MOTRIN) 800 MG tablet Take 800 mg by mouth 3 (three) times daily as needed for moderate pain.   Yes [provider]  lactulose (CHRONULAC) 10 GM/15ML solution Take 45 mLs (30 g total) by mouth 2 (two) times daily. 05/07/17  Yes Erick Blinks, MD  Melatonin 5 MG TABS Take 10 mg by mouth at bedtime.   Yes [provider]  Multiple Vitamins-Minerals (THEREMS-M) TABS Take 1 tablet by mouth daily.   Yes [provider]  QUEtiapine (SEROQUEL) 200 MG tablet Take 200 mg by mouth at bedtime.   Yes [provider]  primidone (MYSOLINE) 250 MG tablet Take 250 mg by mouth 3 (three) times daily.    [provider]  traMADol (ULTRAM) 50 MG tablet Take 1 tablet (50 mg total) by mouth every 6 (six) hours as needed. 05/11/17   Bethann Berkshire, MD    Family History Family History  Problem Relation Age of Onset  . CAD Mother   . Diabetes Mellitus II Father   . Prostate cancer Paternal Grandfather     Social History Social History  Substance Use Topics  . Smoking status: Current Every Day Smoker    Packs/day: 0.50    Years: 20.00    Types: Cigarettes  . Smokeless tobacco: Never Used  . Alcohol use No     Comment: pt states he no longer drinks     Allergies   Penicillins   Review of Systems Review of Systems  Constitutional: Negative for appetite change and fatigue.  HENT: Negative for congestion, ear discharge and sinus pressure.   Eyes: Negative for discharge.  Respiratory: Negative for cough.   Cardiovascular: Negative for chest pain.  Gastrointestinal: Negative for abdominal pain and diarrhea.  Genitourinary: Negative for frequency and hematuria.    Musculoskeletal: Negative for back pain.       Bilateral edema both lower legs 2+  Skin: Negative for rash.  Neurological: Negative for seizures and headaches.  Psychiatric/Behavioral: Negative for hallucinations.     Physical Exam Updated Vital Signs BP 103/69   Pulse 65   Temp 98 F (36.7 C) (Oral)   Resp 18   Ht 5\' 11"  (1.803 m)   Wt 86.2 kg (190 lb)   SpO2 93%   BMI 26.50 kg/m   Physical Exam  Constitutional: He is oriented to person, place, and time. He appears well-developed.  HENT:  Head: Normocephalic.  Eyes: Conjunctivae and EOM are normal. No scleral icterus.  Neck: Neck supple. No thyromegaly present.  Cardiovascular: Normal rate and regular rhythm.  Exam reveals no gallop and no friction rub.   No murmur heard. Pulmonary/Chest: No stridor. He has no wheezes. He has no  rales. He exhibits no tenderness.  Abdominal: He exhibits no distension. There is no tenderness. There is no rebound.  Musculoskeletal: Normal range of motion. He exhibits no edema.  2 plus edema in both legs  Lymphadenopathy:    He has no cervical adenopathy.  Neurological: He is oriented to person, place, and time. He exhibits normal muscle tone. Coordination normal.  Skin: No rash noted. No erythema.  Psychiatric: He has a normal mood and affect. His behavior is normal.     ED Treatments / Results  Labs (all labs ordered are listed, but only abnormal results are displayed) Labs Reviewed  URINALYSIS, ROUTINE W REFLEX MICROSCOPIC - Abnormal; Notable for the following:       Result Value   Color, Urine AMBER (*)    APPearance HAZY (*)    Bilirubin Urine SMALL (*)    Ketones, ur 5 (*)    Protein, ur 100 (*)    All other components within normal limits  CBC WITH DIFFERENTIAL/PLATELET - Abnormal; Notable for the following:    RBC 3.54 (*)    Hemoglobin 11.5 (*)    HCT 33.9 (*)    All other components within normal limits  BASIC METABOLIC PANEL - Abnormal; Notable for the following:     Calcium 8.7 (*)    All other components within normal limits    EKG  EKG Interpretation None       Radiology No results found.  Procedures Procedures (including critical care time)  Medications Ordered in ED Medications  HYDROcodone-acetaminophen (NORCO/VICODIN) 5-325 MG per tablet 1 tablet (1 tablet Oral Given 05/11/17 1745)  furosemide (LASIX) injection 40 mg (40 mg Intravenous Given 05/11/17 1745)     Initial Impression / Assessment and Plan / ED Course  I have reviewed the triage vital signs and the nursing notes.  Pertinent labs & imaging results that were available during my care of the patient were reviewed by me and considered in my medical decision making (see chart for details).     Patient with peripheral edema we will increase his Lasix from 20 mg a day to 40 mg a day and he'll follow-up with PCP  Final Clinical Impressions(s) / ED Diagnoses   Final diagnoses:  Bilateral lower extremity edema    New Prescriptions New Prescriptions   TRAMADOL (ULTRAM) 50 MG TABLET    Take 1 tablet (50 mg total) by mouth every 6 (six) hours as needed.     Bethann BerkshireZammit, Nasya Vincent, MD 05/11/17 2015    Bethann BerkshireZammit, Willma Obando, MD 05/23/17 1214

## 2017-05-11 NOTE — ED Triage Notes (Signed)
Lower extremity edema times 2 weeks.  Lower back pain times one week.  States his urine has a strong odor.

## 2017-05-11 NOTE — Discharge Instructions (Signed)
Increase lasix to 40 mg a day.

## 2017-05-13 ENCOUNTER — Emergency Department
Admission: EM | Admit: 2017-05-13 | Discharge: 2017-05-13 | Disposition: A | Discharge status: Home / Self Care | Payer: Medicare Other | Attending: Emergency Medicine | Admitting: Emergency Medicine

## 2017-05-13 ENCOUNTER — Ambulatory Visit (HOSPITAL_COMMUNITY): Admission: RE | Admit: 2017-05-13 | Payer: Medicare Other | Source: Ambulatory Visit

## 2017-05-13 DIAGNOSIS — I5031 Acute diastolic (congestive) heart failure: Secondary | ICD-10-CM | POA: Insufficient documentation

## 2017-05-13 DIAGNOSIS — F17210 Nicotine dependence, cigarettes, uncomplicated: Secondary | ICD-10-CM | POA: Diagnosis not present

## 2017-05-13 DIAGNOSIS — R600 Localized edema: Secondary | ICD-10-CM | POA: Diagnosis not present

## 2017-05-13 DIAGNOSIS — J449 Chronic obstructive pulmonary disease, unspecified: Secondary | ICD-10-CM | POA: Diagnosis not present

## 2017-05-13 DIAGNOSIS — J45909 Unspecified asthma, uncomplicated: Secondary | ICD-10-CM | POA: Insufficient documentation

## 2017-05-13 DIAGNOSIS — G8929 Other chronic pain: Secondary | ICD-10-CM

## 2017-05-13 DIAGNOSIS — I110 Hypertensive heart disease with heart failure: Secondary | ICD-10-CM | POA: Diagnosis not present

## 2017-05-13 DIAGNOSIS — I509 Heart failure, unspecified: Secondary | ICD-10-CM | POA: Insufficient documentation

## 2017-05-13 DIAGNOSIS — M545 Low back pain, unspecified: Secondary | ICD-10-CM

## 2017-05-13 HISTORY — DX: Heart failure, unspecified: I50.9

## 2017-05-13 LAB — URINE DRUG SCREEN, QUALITATIVE (ARMC ONLY)
AMPHETAMINES, UR SCREEN: NOT DETECTED
BENZODIAZEPINE, UR SCRN: NOT DETECTED
Barbiturates, Ur Screen: POSITIVE — AB
Cannabinoid 50 Ng, Ur ~~LOC~~: NOT DETECTED
Cocaine Metabolite,Ur ~~LOC~~: NOT DETECTED
MDMA (ECSTASY) UR SCREEN: NOT DETECTED
METHADONE SCREEN, URINE: NOT DETECTED
Opiate, Ur Screen: NOT DETECTED
PHENCYCLIDINE (PCP) UR S: NOT DETECTED
Tricyclic, Ur Screen: POSITIVE — AB

## 2017-05-13 LAB — URINALYSIS, COMPLETE (UACMP) WITH MICROSCOPIC
BILIRUBIN URINE: NEGATIVE
Bacteria, UA: NONE SEEN
GLUCOSE, UA: NEGATIVE mg/dL
HGB URINE DIPSTICK: NEGATIVE
KETONES UR: NEGATIVE mg/dL
LEUKOCYTES UA: NEGATIVE
NITRITE: NEGATIVE
PH: 6 (ref 5.0–8.0)
Protein, ur: NEGATIVE mg/dL
RBC / HPF: NONE SEEN RBC/hpf (ref 0–5)
SPECIFIC GRAVITY, URINE: 1.015 (ref 1.005–1.030)
SQUAMOUS EPITHELIAL / LPF: NONE SEEN

## 2017-05-13 LAB — CBC WITH DIFFERENTIAL/PLATELET
BASOS ABS: 0.1 10*3/uL (ref 0–0.1)
Basophils Relative: 1 %
EOS ABS: 0.1 10*3/uL (ref 0–0.7)
Eosinophils Relative: 3 %
HCT: 36.4 % — ABNORMAL LOW (ref 40.0–52.0)
HEMOGLOBIN: 12.5 g/dL — AB (ref 13.0–18.0)
LYMPHS ABS: 1.6 10*3/uL (ref 1.0–3.6)
LYMPHS PCT: 32 %
MCH: 32.4 pg (ref 26.0–34.0)
MCHC: 34.3 g/dL (ref 32.0–36.0)
MCV: 94.6 fL (ref 80.0–100.0)
Monocytes Absolute: 0.5 10*3/uL (ref 0.2–1.0)
Monocytes Relative: 11 %
NEUTROS PCT: 53 %
Neutro Abs: 2.6 10*3/uL (ref 1.4–6.5)
PLATELETS: 266 10*3/uL (ref 150–440)
RBC: 3.85 MIL/uL — AB (ref 4.40–5.90)
RDW: 15.4 % — ABNORMAL HIGH (ref 11.5–14.5)
WBC: 4.9 10*3/uL (ref 3.8–10.6)

## 2017-05-13 LAB — ETHANOL

## 2017-05-13 LAB — COMPREHENSIVE METABOLIC PANEL
ALT: 22 U/L (ref 17–63)
AST: 24 U/L (ref 15–41)
Albumin: 3.5 g/dL (ref 3.5–5.0)
Alkaline Phosphatase: 83 U/L (ref 38–126)
Anion gap: 7 (ref 5–15)
BILIRUBIN TOTAL: 0.4 mg/dL (ref 0.3–1.2)
BUN: 8 mg/dL (ref 6–20)
CHLORIDE: 106 mmol/L (ref 101–111)
CO2: 27 mmol/L (ref 22–32)
Calcium: 8.5 mg/dL — ABNORMAL LOW (ref 8.9–10.3)
Creatinine, Ser: 0.56 mg/dL — ABNORMAL LOW (ref 0.61–1.24)
Glucose, Bld: 86 mg/dL (ref 65–99)
POTASSIUM: 3.9 mmol/L (ref 3.5–5.1)
Sodium: 140 mmol/L (ref 135–145)
TOTAL PROTEIN: 7 g/dL (ref 6.5–8.1)

## 2017-05-13 LAB — BRAIN NATRIURETIC PEPTIDE: B NATRIURETIC PEPTIDE 5: 86 pg/mL (ref 0.0–100.0)

## 2017-05-13 MED ORDER — KETOROLAC TROMETHAMINE 30 MG/ML IJ SOLN
30.0000 mg | Freq: Once | INTRAMUSCULAR | Status: AC
  Administered 2017-05-13: 30 mg via INTRAVENOUS
  Filled 2017-05-13: qty 1

## 2017-05-13 NOTE — Discharge Instructions (Signed)
Return to the emergency room for any new or worrisome symptoms include numbness or weakness, follow closely with primary care doctor.

## 2017-05-13 NOTE — ED Notes (Signed)
Pt given graham crackers.

## 2017-05-13 NOTE — ED Notes (Signed)
Pt given chocolate milk 

## 2017-05-13 NOTE — ED Triage Notes (Signed)
Pt comes via [facility] from [facility] with c/o lower left back pain and bilateral facial swelling. Pt A&OX4, VS stable per EMS BP-135/95. Pt also states has had dark colored urine with odor. Pt in NAD, respirations even and unlabored.

## 2017-05-13 NOTE — ED Notes (Signed)
Shane HuffMeghan [name] legal guardian of pt called and left detailed message notifying her of pt being brought here to ED from group home. Attempted to call Supervisor Ms. Marlyne BeardsJennings of Shane HuffMeghan [name] and also left detailed message regarding need to notify them of pt's presence here at ED.

## 2017-05-13 NOTE — ED Provider Notes (Addendum)
[facility] Emergency Department Provider Note  ____________________________________________   I have reviewed the triage vital signs and the nursing notes.   HISTORY  Chief Complaint I need pain medication   HPI [name] is a 41 y.o. male patient here with low back pain which is chronic kidney states he needs pain medication that started in tramadol for it. He states tramadol and Motrin which he is taking are"like water" the gabapentin is not helping either. He would like some stronger pain medication. Patient did have a recent admission for a altered mental status which was presumed to be polypharmaceutical and some medications were removed, patient also has a history of EtOH abuse and CHF. He denies any recent trauma fever or chills he does not have any focal numbness or weakness, he states his lower legs are slightly swollen and he recently had his Lasix increased, but his legs still feel somewhat swollen to him he denies any chest pain or shortness of breath. He states his face feels swollen to him he denies any tongue swelling or oropharyngeal swelling he denies any anaphylactic symptoms or angioedema symptoms. Patient recurrently circles back to the his need for narcotic pain medication. Patient did have lower extremity Dopplers done last few weeks which were negative for the same issue    Past Medical History:  Diagnosis Date  . Anxiety   . Asthma   . CHF (congestive heart failure) (HCC)   . Depression   . Encephalopathy   . GERD (gastroesophageal reflux disease)   . Hypertension   . Hyponatremia 06/2016  . Renal disorder    kidney injury  . Seizures [facility])     Patient Active Problem List   Diagnosis Date Noted  . Chronic respiratory failure with hypoxia (HCC) 05/03/2017  . COPD (chronic obstructive pulmonary disease) (HCC) 05/03/2017  . Pedal edema 05/03/2017  . Altered mental state 05/02/2017  . Acute on chronic respiratory failure with  hypoxia (HCC) 04/05/2017  . COPD exacerbation (HCC) 04/03/2017  . Musculoskeletal pain 06/28/2016  . Left flank pain   . Hyponatremia 06/27/2016  . Acute encephalopathy 01/06/2016  . 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 Pleural effusion, right 12/31/2015  . GERD (gastroesophageal reflux disease) 11/11/2015  . Borderline personality disorder 11/11/2015  . Alcohol use disorder, severe, in controlled environment (HCC) 11/11/2015  . 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 Tobacco abuse 11/05/2013  . HTN (hypertension) 11/05/2013    Past Surgical History:  Procedure Laterality Date  . FINGER SURGERY Left    5th digit  . PLEURAL EFFUSION DRAINAGE Right 01/02/2016   Procedure: DRAINAGE OF PLEURAL EFFUSION;  Surgeon: Loreli Slot, MD;  Location: [facility] OR;  Service: Thoracic;  Laterality: Right;  Marland Kitchen VIDEO ASSISTED THORACOSCOPY (VATS)/DECORTICATION Right 01/02/2016   Procedure: VIDEO ASSISTED THORACOSCOPY (VATS)/DECORTICATION;  Surgeon: Loreli Slot, MD;  Location: [facility] OR;  Service: Thoracic;  Laterality: Right;  Marland Kitchen VIDEO BRONCHOSCOPY N/A 01/02/2016   Procedure: VIDEO BRONCHOSCOPY;  Surgeon: Loreli Slot, MD;  Location: [facility] OR;  Service: Thoracic;  Laterality: N/A;    Prior to Admission medications   Medication Sig Start Date End Date Taking? Authorizing Provider  albuterol (PROVENTIL HFA;VENTOLIN HFA) 108 (90 Base) MCG/ACT inhaler Inhale 2 puffs into the lungs every 4 (four) hours as needed for wheezing or shortness of breath.    [provider]  cyclobenzaprine (FLEXERIL) 10 MG tablet  Take 10 mg by mouth every 8 (eight) hours as needed for muscle spasms.    [provider]  disulfiram (ANTABUSE) 250 MG tablet Take 250 mg by mouth daily.    [provider]  divalproex (DEPAKOTE ER) 500 MG 24 hr tablet Take 4 tablets (2,000 mg  total) by mouth at bedtime. 11/13/15   Jimmy FootmanHernandez-Gonzalez, Andrea, MD  escitalopram (LEXAPRO) 20 MG tablet Take 20 mg by mouth every morning.    [provider]  esomeprazole (NEXIUM) 40 MG capsule Take 40 mg by mouth daily at 12 noon.    [provider]  ferrous sulfate 325 (65 FE) MG tablet Take 1 tablet (325 mg total) by mouth 2 (two) times daily with a meal. 01/11/16   Regalado, Belkys A, MD  Fluticasone-Salmeterol (ADVAIR) 250-50 MCG/DOSE AEPB Inhale 1 puff into the lungs 2 (two) times daily.    [provider]  furosemide (LASIX) 20 MG tablet Take 1 tablet (20 mg total) by mouth daily. 04/22/17   Jacalyn LefevreHaviland, Julie, MD  gabapentin (NEURONTIN) 300 MG capsule Take 300 mg by mouth 3 (three) times daily.    [provider]  hydrOXYzine (ATARAX/VISTARIL) 50 MG tablet Take 1 tablet (50 mg total) by mouth 3 (three) times daily. 05/07/17   Erick BlinksMemon, Jehanzeb, MD  ibuprofen (ADVIL,MOTRIN) 800 MG tablet Take 800 mg by mouth 3 (three) times daily as needed for moderate pain.    [provider]  lactulose (CHRONULAC) 10 GM/15ML solution Take 45 mLs (30 g total) by mouth 2 (two) times daily. 05/07/17   Erick BlinksMemon, Jehanzeb, MD  Melatonin 5 MG TABS Take 10 mg by mouth at bedtime.    [provider]  Multiple Vitamins-Minerals (THEREMS-M) TABS Take 1 tablet by mouth daily.    [provider]  primidone (MYSOLINE) 250 MG tablet Take 250 mg by mouth 3 (three) times daily.    [provider]  QUEtiapine (SEROQUEL) 200 MG tablet Take 200 mg by mouth at bedtime.    [provider]  traMADol (ULTRAM) 50 MG tablet Take 1 tablet (50 mg total) by mouth every 6 (six) hours as needed. 05/11/17   Bethann BerkshireZammit, Joseph, MD  traMADol (ULTRAM) 50 MG tablet Take 1 tablet (50 mg total) by mouth every 6 (six) hours as needed. 05/11/17   Bethann BerkshireZammit, Joseph, MD    Allergies Penicillins  Family History  Problem Relation Age of Onset  . CAD Mother   . Diabetes Mellitus II  Father   . Prostate cancer Paternal Grandfather     Social History Social History  Substance Use Topics  . Smoking status: Current Every Day Smoker    Packs/day: 0.50    Years: 20.00    Types: Cigarettes  . Smokeless tobacco: Never Used  . Alcohol use No     Comment: pt states he no longer drinks    Review of Systems Constitutional: No fever/chills Eyes: No visual changes. ENT: No sore throat. No stiff neck no neck pain Cardiovascular: Denies chest pain. Respiratory: Denies shortness of breath. Gastrointestinal:   no vomiting.  No diarrhea.  No constipation. Genitourinary: Negative for dysuria. Musculoskeletal: Positive chronic lower extremity swelling Skin: Negative for rash. Neurological: Negative for severe headaches, focal weakness or numbness.   ____________________________________________   PHYSICAL EXAM:  VITAL SIGNS: ED Triage Vitals  Enc Vitals Group     BP 05/13/17 1023 139/86     Pulse Rate 05/13/17 1023 62     Resp 05/13/17 1023 18  Temp 05/13/17 1023 98.1 F (36.7 C)     Temp Source 05/13/17 1023 Oral     SpO2 05/13/17 1023 97 %     Weight 05/13/17 1024 180 lb (81.6 kg)     Height 05/13/17 1024 5\' 11"  (1.803 m)     Head Circumference --      Peak Flow --      Pain Score 05/13/17 1023 9     Pain Loc --      Pain Edu? --      Excl. in GC? --     Constitutional: Alert and oriented. Well appearing and in no acute distress. Eyes: Conjunctivae are normal Head: Atraumatic HEENT: No congestion/rhinnorhea. Mucous membranes are moist.  Oropharynx non-erythematous Neck:   Nontender with no meningismus, no masses, no stridor Cardiovascular: Normal rate, regular rhythm. Grossly normal heart sounds.  Good peripheral circulation. Respiratory: Normal respiratory effort.  No retractions. Lungs CTAB. Abdominal: Soft and nontender. No distention. No guarding no rebound Back:  There is reported tenderness to the paraspinal muscles in the lower lumbar region.  However patient was able to transfer from stretcher to bed with no evidence of discomfort but was asked him to move, he grimaces and axis of hurts. When he is not observed however he is able to move with no difficulty. Light pressure to the top of his head with the palm of my hand causes him just yell and state that that makes the pain in his low back worse also when I pull on his pain loops from his belt on the back, he also states this makes his pain worse. There is no shingles.Marland Kitchen  there is no midline tenderness there are no lesions noted. there is no CVA tenderness Musculoskeletal: No lower extremity tenderness, no upper extremity tenderness. No joint effusions, no DVT signs strong distal pulses scant bilateral symmetric edema Neurologic:  Normal speech and language. No gross focal neurologic deficits are appreciated.  Skin:  Skin is warm, dry and intact. No rash noted. Psychiatric: Mood and affect are normal. Speech and behavior are normal.  ____________________________________________   LABS (all labs ordered are listed, but only abnormal results are displayed)  Labs Reviewed - No data to display ____________________________________________  EKG  I personally interpreted any EKGs ordered by me or triage  ____________________________________________  RADIOLOGY  I reviewed any imaging ordered by me or triage that were performed during my shift and, if possible, patient and/or family made aware of any abnormal findings. ____________________________________________   PROCEDURES  Procedure(s) performed: None  Procedures  Critical Care performed: None  ____________________________________________   INITIAL IMPRESSION / ASSESSMENT AND PLAN / ED COURSE  Pertinent labs & imaging results that were available during my care of the patient were reviewed by me and considered in my medical decision making (see chart for details).  Patient here for narcotic pain medication M am not  giving him. He was recently admitted for altered mental status in part possibly because of narcotics. He has been given tramadol at home for his pain, and he states that that hospitalist is "not doing enough" to manage his pain so he is here to different hospital today. Obviously we are not going to be able to change management just because he is at a different hospital. We will however check basic blood work including a BNP is is complaining of some lower extremity swelling which to my exam is slight. His recent Doppler studies are reassuring. And he recently had increased Lasix. He  does have history of renal insufficiency and we'll check kidney function, he has no chest pain. He has no shortness of breath. I don't see any facial swelling. I don't think that this low back pain is indicative of paraspinal abscess or cauda equina syndrome or AAA or any other acute pathology requiring attention. Patient clearly has some degree of exaggeration, as noted by the fact that he is able to move without difficulty and thus she is asked to specifically and the fact that light touch to the top of his head somehow increases his lower back pain.   ----------------------------------------- 3:54 PM on 05/13/2017 -----------------------------------------  The patient is watching television in no acute distress he states at baseline he walks with a cane because of chronic pain issues. I don't see any evidence of saddle anesthesia or acute decompensated issue with his back and as noted is a very variable exam. He has no complaints at this time he feels better after pain medication is no evidence of significant leg swelling or CHF or cellulitis. Patient is here because he wanted stronger pain medications however I am not and position to give him narcotic pain medication he is somewhat disappointed by this. He does have a doctor that goes to his facility to check up on him and he will report to that doctor on Monday. Otherwise I  don't see any indication for acute advancement of care.  He has no midline back pain, no evidence of cauda equina syndrome or other red flags to suggest decompensation. All of these symptoms have been going on for a great deal of time and I are CTs of his back for his back pain have been negative.      ____________________________________________   FINAL CLINICAL IMPRESSION(S) / ED DIAGNOSES  Final diagnoses:  None      This chart was dictated using voice recognition software.  Despite best efforts to proofread,  errors can occur which can change meaning.      Jeanmarie Plant, MD 05/13/17 1139    Jeanmarie Plant, MD 05/13/17 1556    Jeanmarie Plant, MD 05/13/17 [phone]

## 2017-05-13 NOTE — ED Notes (Signed)
Pt given water 

## 2017-05-13 NOTE — ED Notes (Signed)
Pt updated that we still need urine specimen. Pt given urinal at bedside

## 2017-05-13 NOTE — ED Notes (Signed)
[facility] was called and spoke with Elease HashimotoPatricia to notify her that pt was up for discarge. Elease Hashimotoatricia stated that Chrissie NoaWilliam would be on his way to come and pick up the pt. Elease Hashimotoatricia stated [place] [facility] [place].

## 2017-10-08 IMAGING — CR DG CHEST 1V PORT
1 series · 1 of 1 positions shown · non-contrast
Comparison: 11/24/2013

CLINICAL DATA: Altered mental status

EXAM:
PORTABLE CHEST 1 VIEW

[dg chest port 1 view]
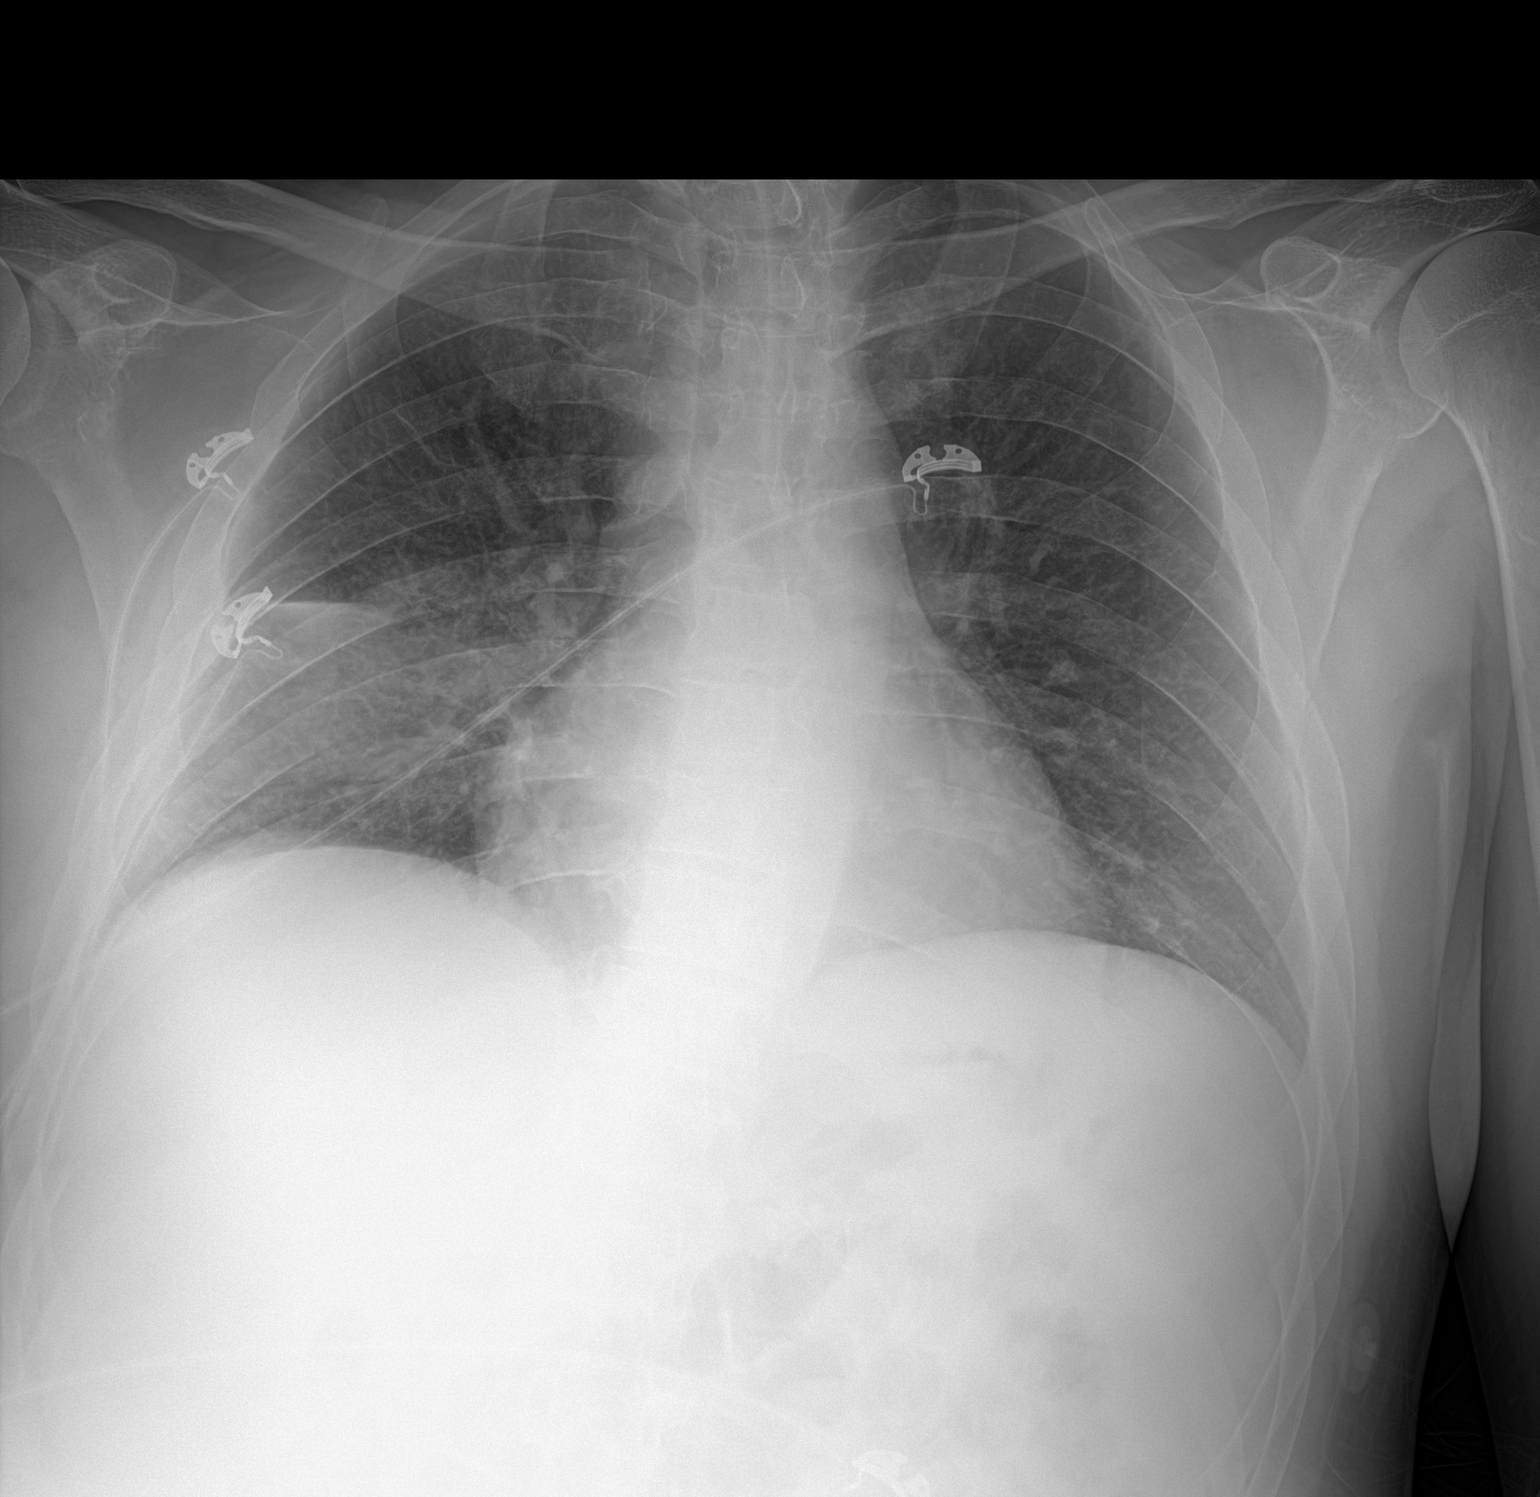

[1 of 1 positions shown; findings below may reference images not displayed]

FINDINGS: Cardiomediastinal silhouette is stable. There is
infiltrate/pneumonia in right lower lobe laterally. No pulmonary
edema. Left lung is clear.
IMPRESSION: Infiltrate/ pneumonia in right lower lobe lateral segment. No
pulmonary edema.

## 2017-10-08 IMAGING — CT CT HEAD W/O CM
3 series · 15 of 30 positions shown, 17 images · non-contrast
Comparison: 06/19/2012

CLINICAL DATA: Altered mental status.

EXAM:
CT HEAD WITHOUT CONTRAST
TECHNIQUE: Contiguous axial images were obtained from the base of the skull
through the vertex without intravenous contrast.

[Series 2: head wo · axial · 0.40mm/px · z∈[+10,+110]mm · 5 of 31 slices shown, 7 images]
[im 6/31  brain]
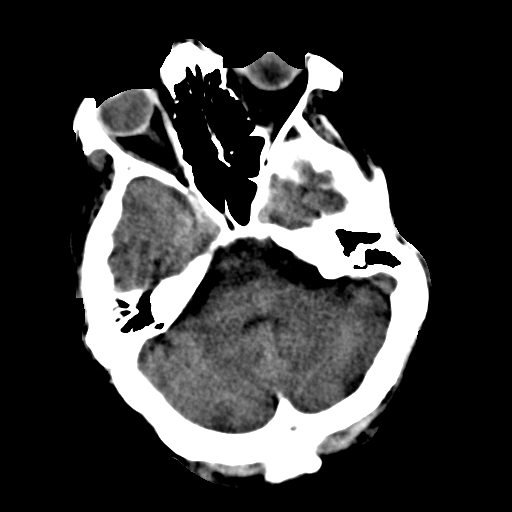
[im 6/31  bone]
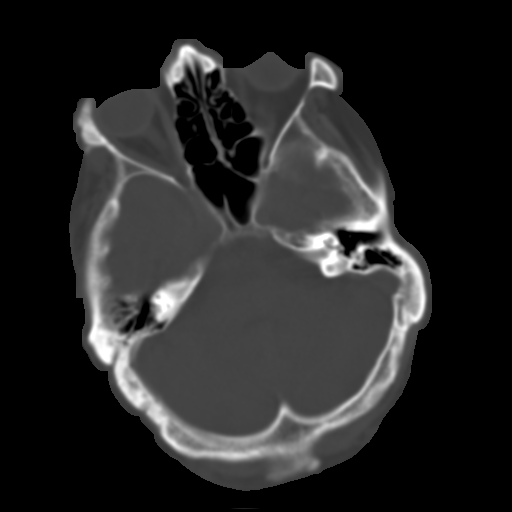
[im 11/31  brain]
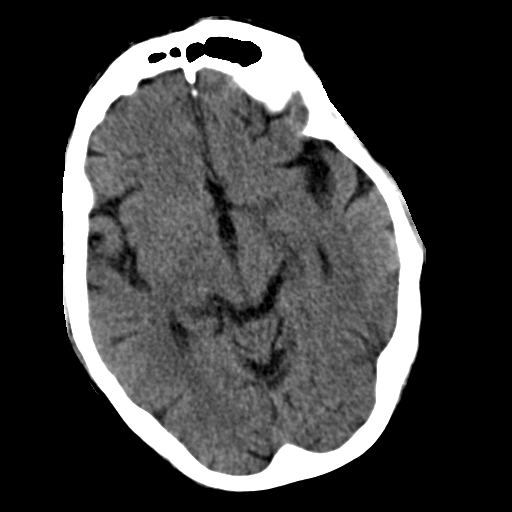
[im 16/31  brain]
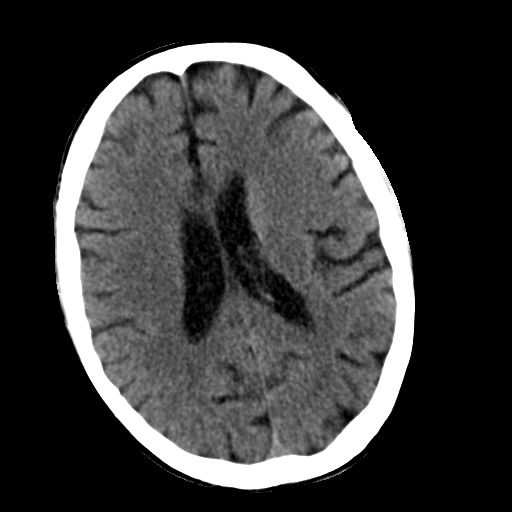
[im 21/31  brain]
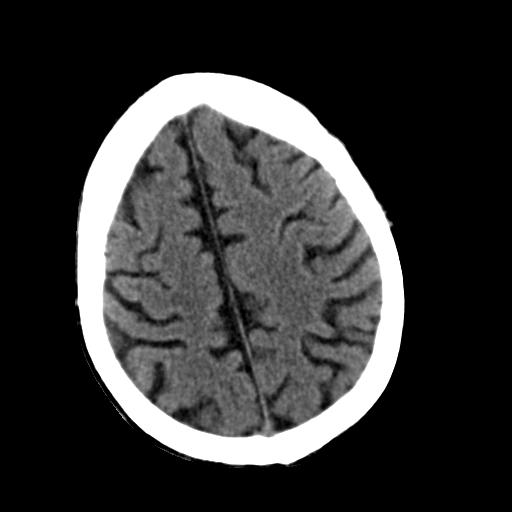
[im 26/31  brain]
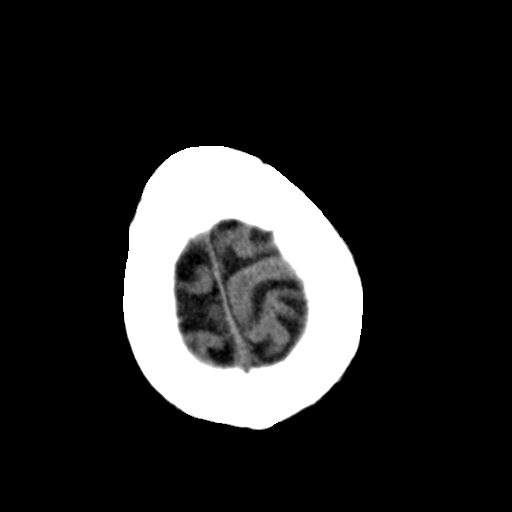
[im 26/31  bone]
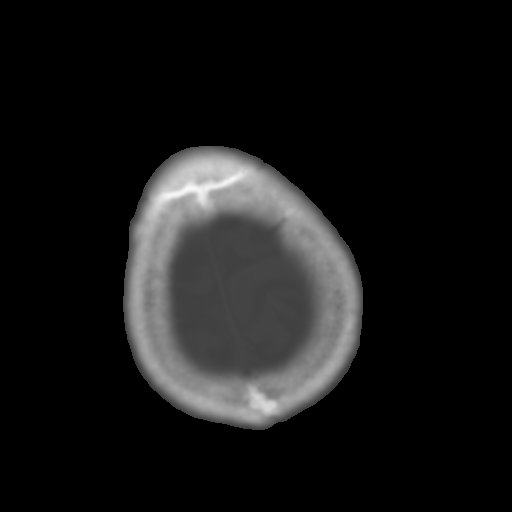

[Series 4: head wo 2 · axial · 0.32mm/px · z∈[+35,+112]mm · 4 of 28 slices shown]
[im 6/28  brain]
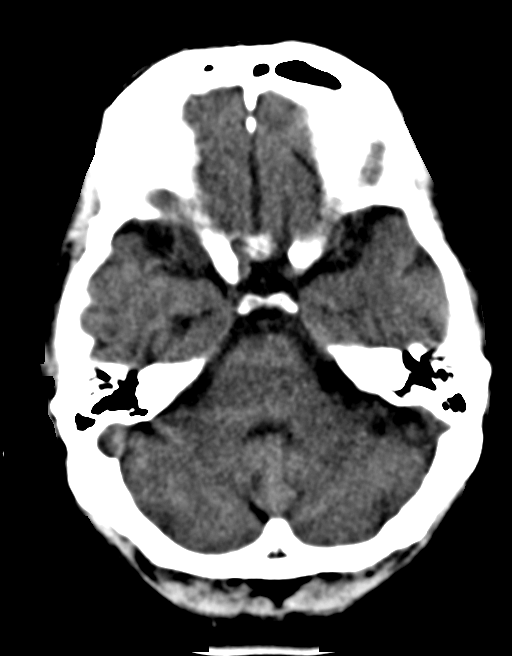
[im 11/28  brain]
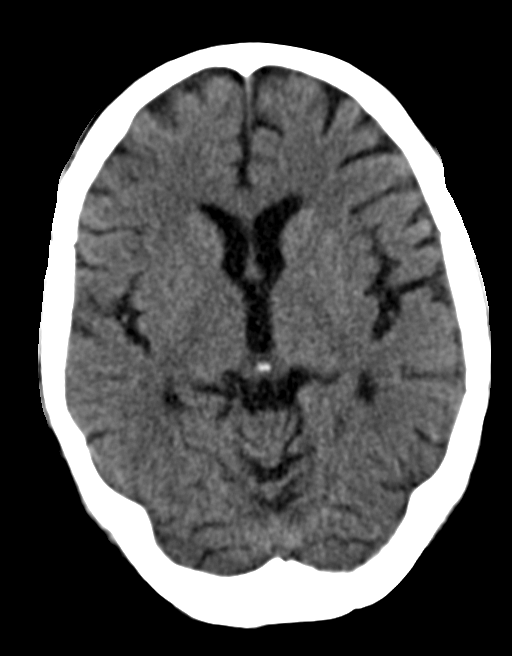
[im 17/28  brain]
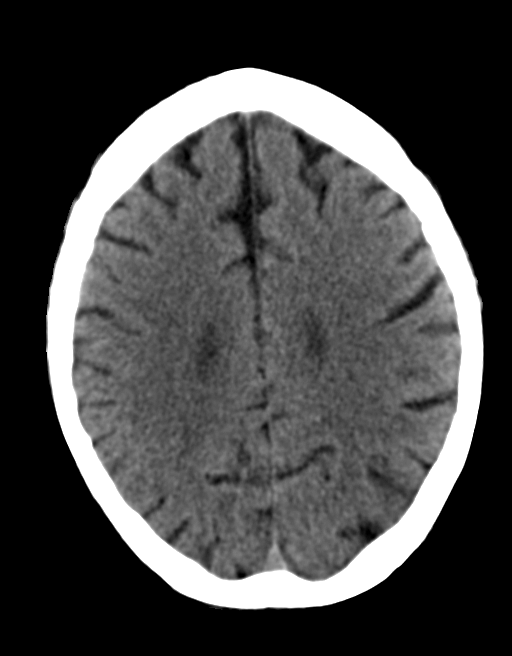
[im 22/28  brain]
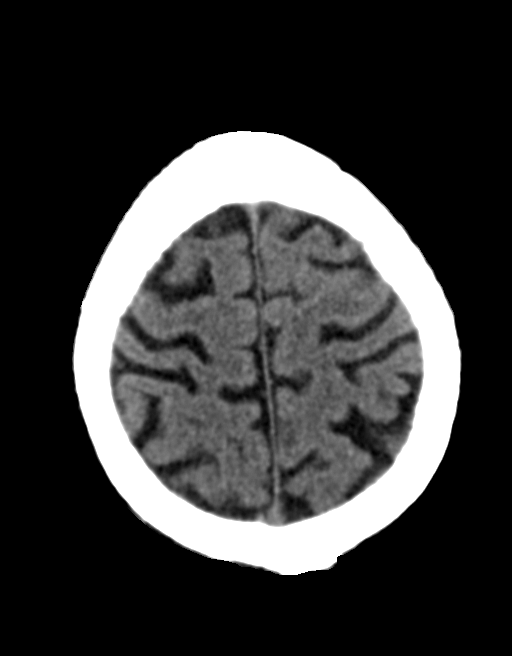

[Series 5: head bone 2 · axial · 0.34mm/px · z∈[+7,+95]mm · 6 of 73 slices shown]
[im 5/73  bone]
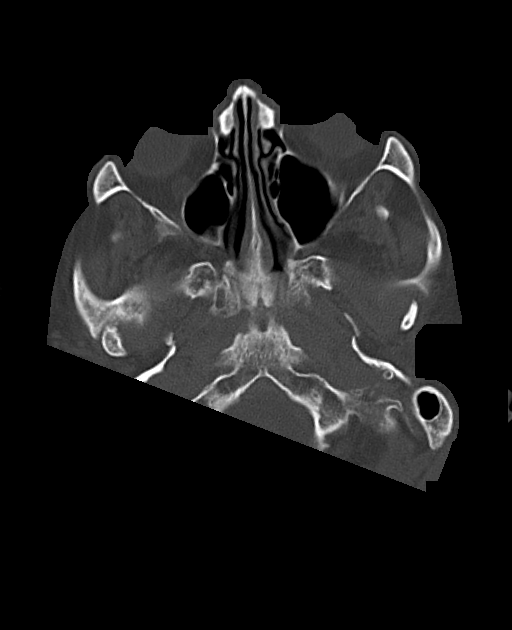
[im 14/73  bone]
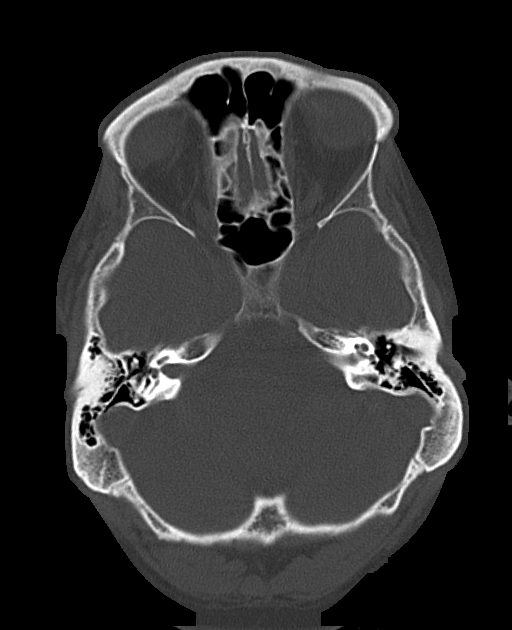
[im 23/73  bone]
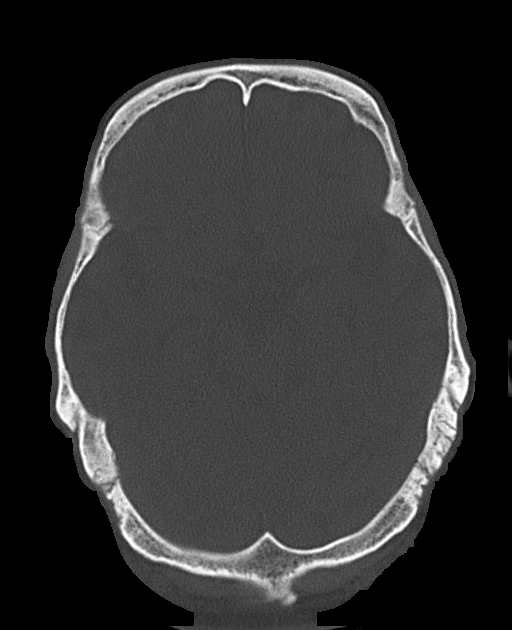
[im 32/73  bone]
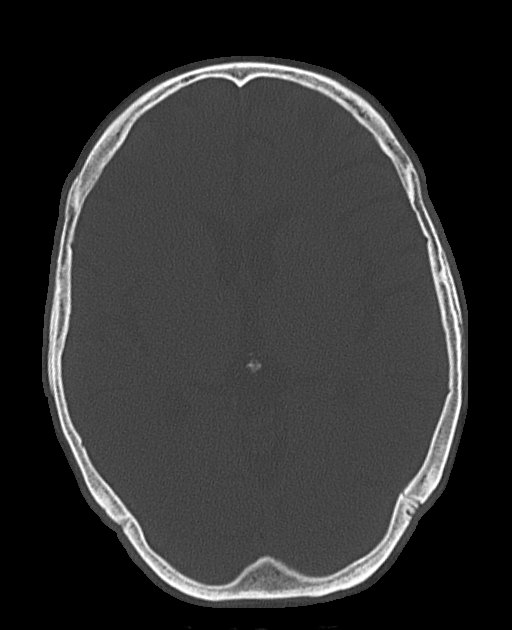
[im 41/73  bone]
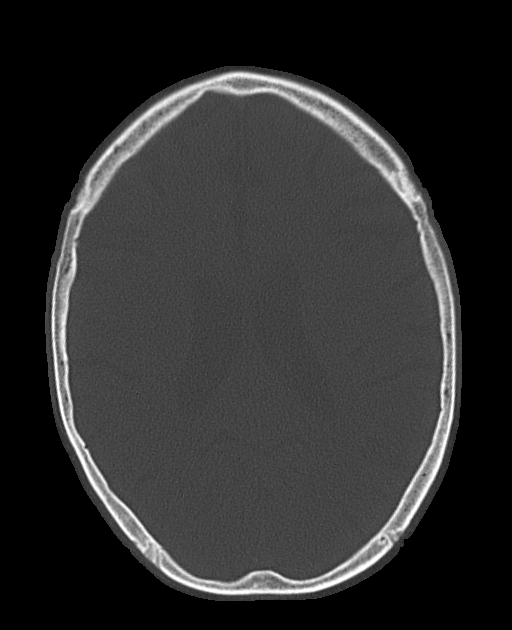
[im 50/73  bone]
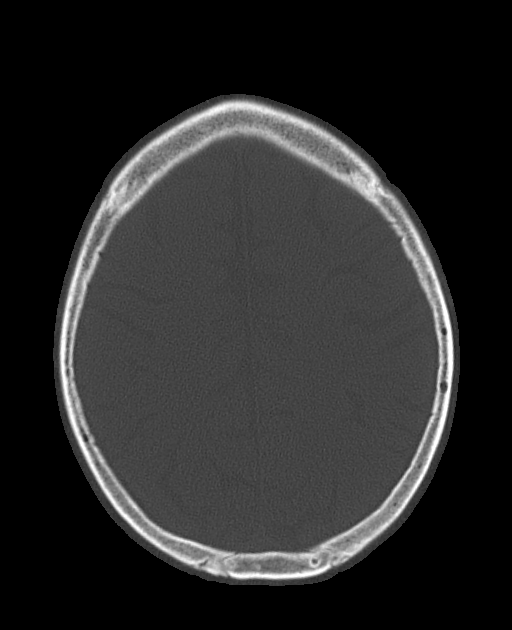

[15 of 30 positions shown; findings below may reference images not displayed]

FINDINGS: There is no evidence of mass effect, midline shift or extra-axial
fluid collections. There is no evidence of a space-occupying lesion
or intracranial hemorrhage. There is no evidence of a cortical-based
area of acute infarction.

The ventricles and sulci are appropriate for the patient's age. The
basal cisterns are patent.

Visualized portions of the orbits are unremarkable. The visualized
portions of the paranasal sinuses and mastoid air cells are
unremarkable.

The osseous structures are unremarkable.
IMPRESSION: No acute intracranial pathology.

## 2017-12-13 IMAGING — CR DG TOE 3RD 2+V*R*
1 series · 3 of 3 positions shown · non-contrast
Comparison: None.

CLINICAL DATA: Initial valuation for acute pain status post
seizure.

EXAM:
RIGHT THIRD TOE

[Series 1: dg toe 3rd right · 0.14mm/px · 3 of 3 slices shown]
[im 1/3]
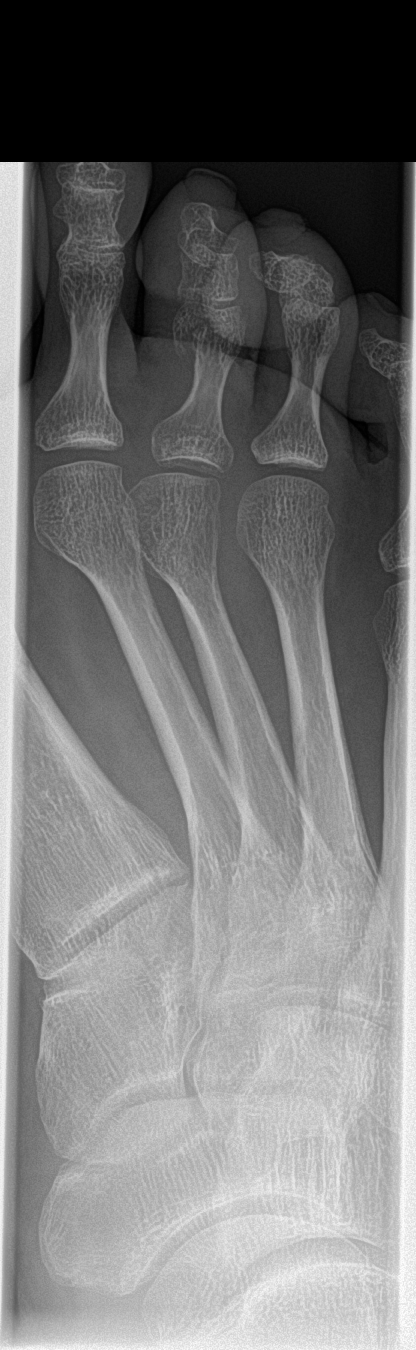
[im 2/3]
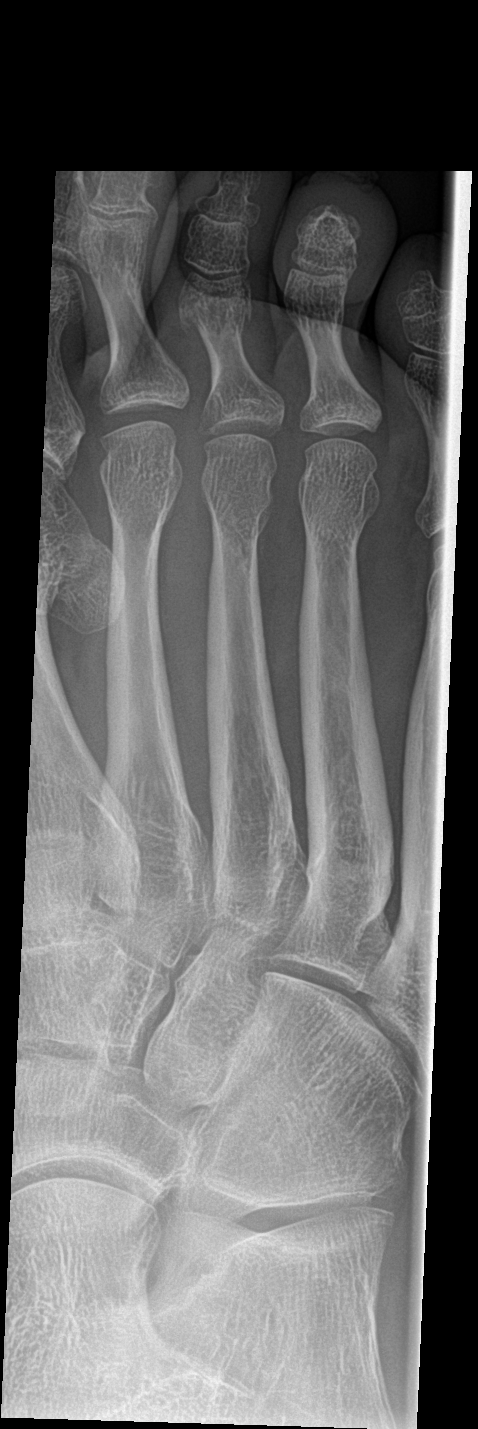
[im 3/3]
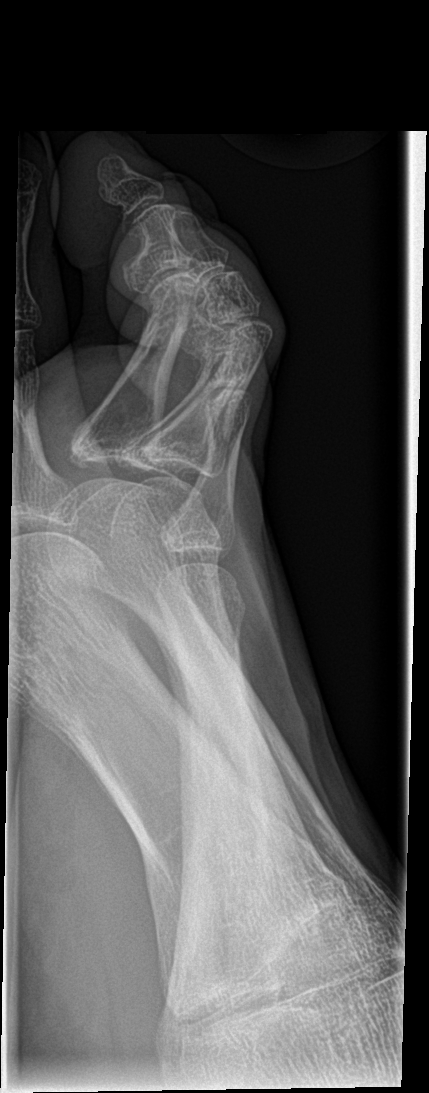

[3 of 3 positions shown; findings below may reference images not displayed]

FINDINGS: There is an acute transverse fracture through the distal shaft of
the right third proximal phalanx without significant displacement.
Third PIP and MTP joints remain approximated. No other fracture.
Osseous mineralization normal. No significant degenerative changes.
Mild soft tissue swelling of the third digit.
IMPRESSION: Acute transverse fracture through the distal shaft of the right
third proximal phalanx without significant displacement.

## 2018-02-04 IMAGING — DX DG CHEST 2V
2 series · 2 of 2 positions shown · non-contrast
Comparison: None.

CLINICAL DATA: Cough for 3 weeks, worsening shortness of breath.

EXAM:
CHEST  2 VIEW

[chest lat]
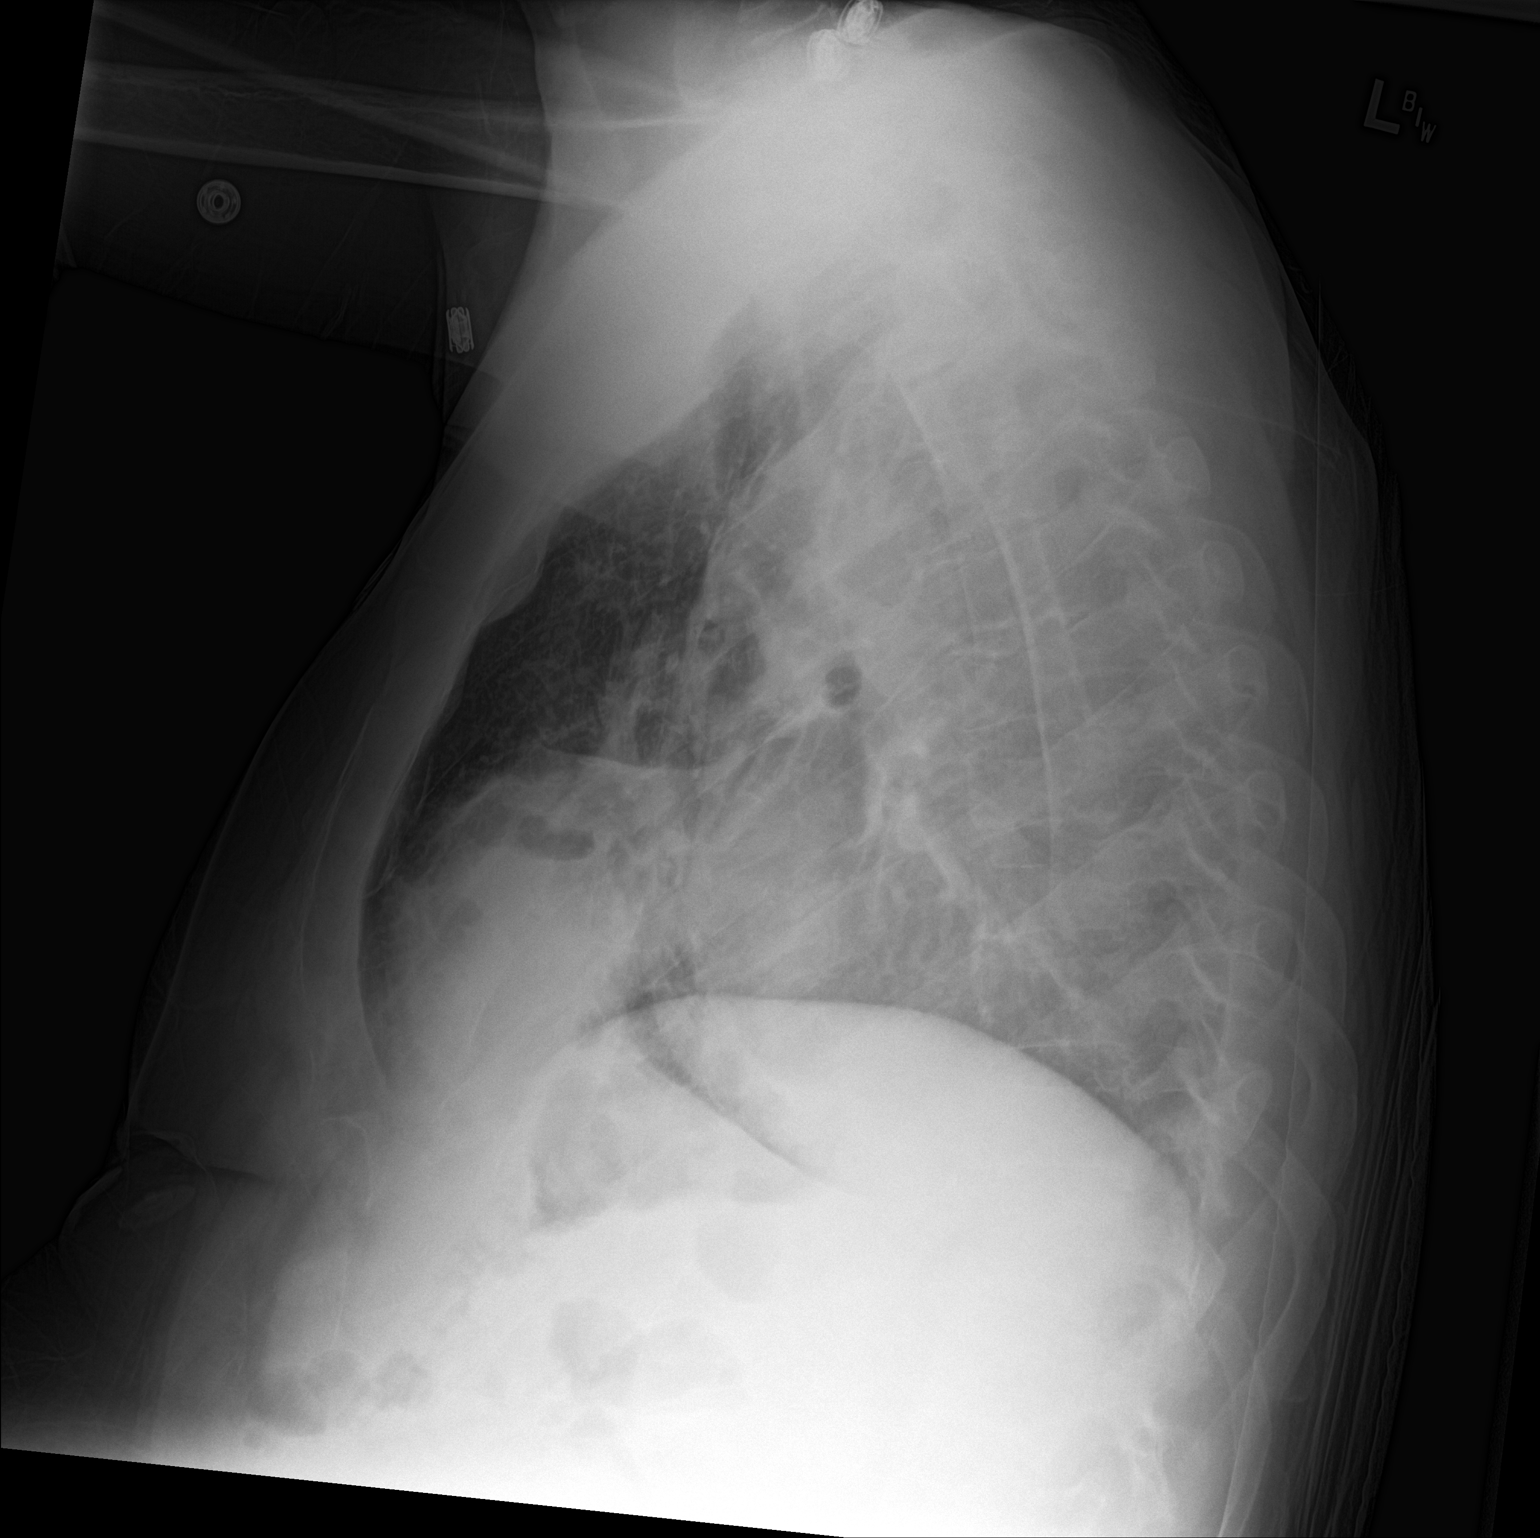

[chest ap]
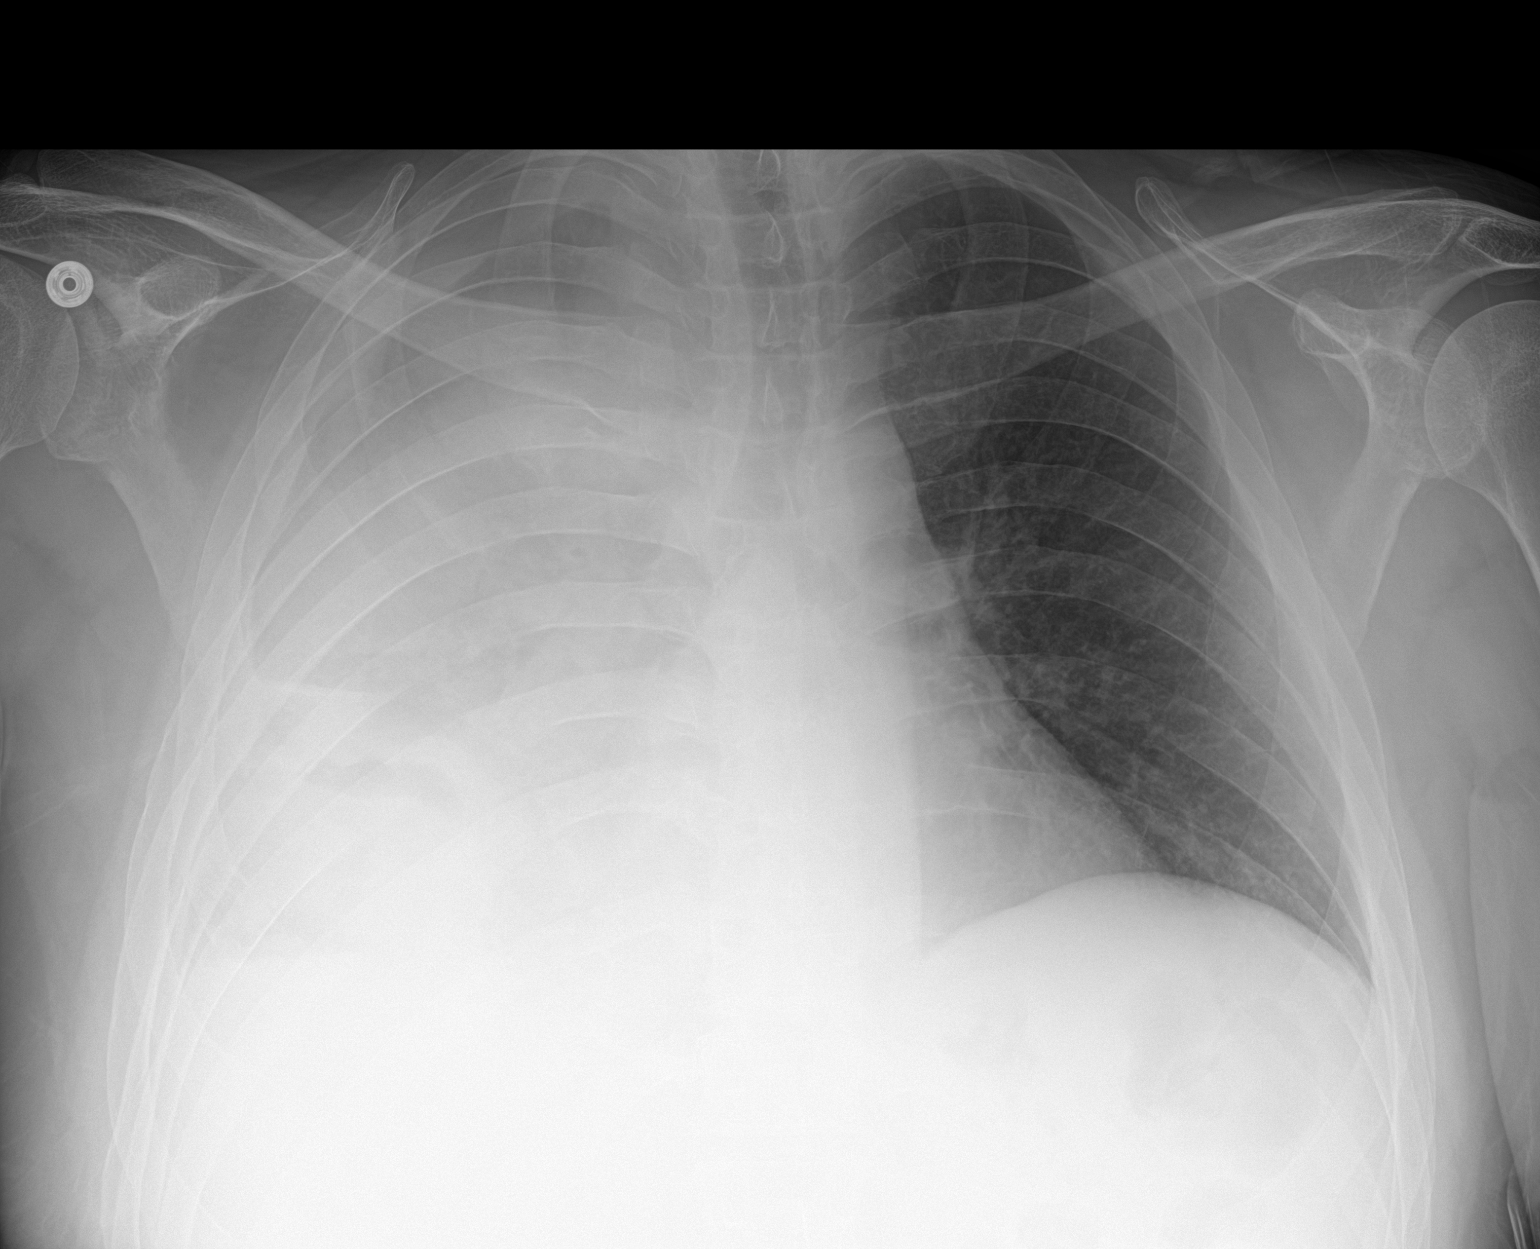

[2 of 2 positions shown; findings below may reference images not displayed]

FINDINGS: There is complete opacity of the right hemi thorax. There is
suggestion of air-fluid level/ cavity in the right lung base. The
left lung is clear. The evaluation of the mediastinum and heart size
are limited due to with loss of mediastinal and cardiac border. The
visualized skeletal structures are unremarkable.
IMPRESSION: Complete opacity of the right hemi thorax with suggestion of
air-fluid level/ cavity in the right lung base, further evaluation
with chest CT is recommended.

## 2018-02-04 IMAGING — CT CT CHEST W/ CM
2 of 3 series · 11 of 36 positions shown, 13 images · IV contrast (Iodine)
Comparison: Chest radiograph performed earlier today at [DATE] p.m.

CLINICAL DATA: Acute onset of cough and shortness of breath.
Right-sided pleural effusion. Initial encounter.

EXAM:
CT CHEST WITH CONTRAST
TECHNIQUE: Multidetector CT imaging of the chest was performed during
intravenous contrast administration.
CONTRAST:  100mL OMNIPAQUE IOHEXOL 300 MG/ML  SOLN

[Series 201: chest with, idose (2) · axial · 0.79mm/px · z∈[+54,+334]mm · 8 of 66 slices shown, 10 images]
[im 5/66  mediastinal]
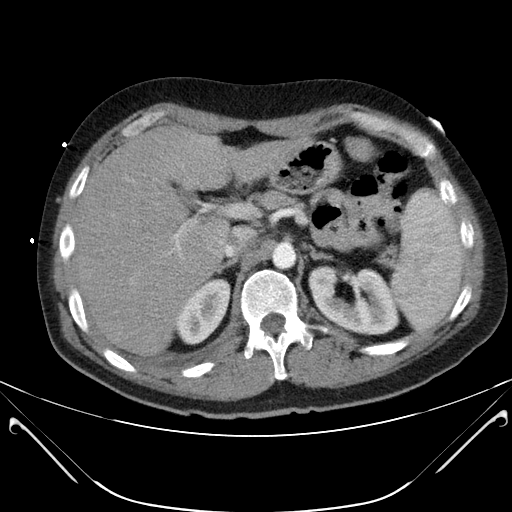
[im 5/66  lung]
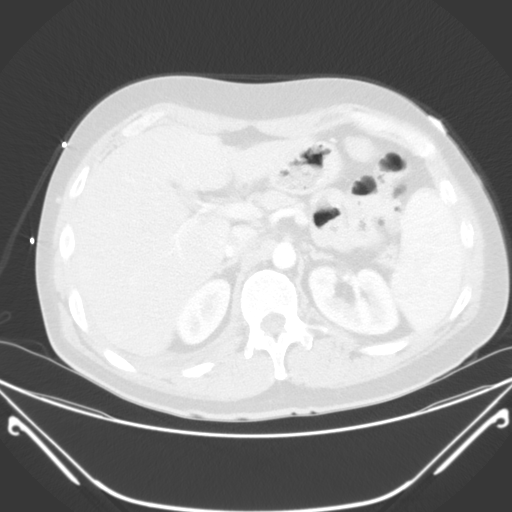
[im 13/66  lung]
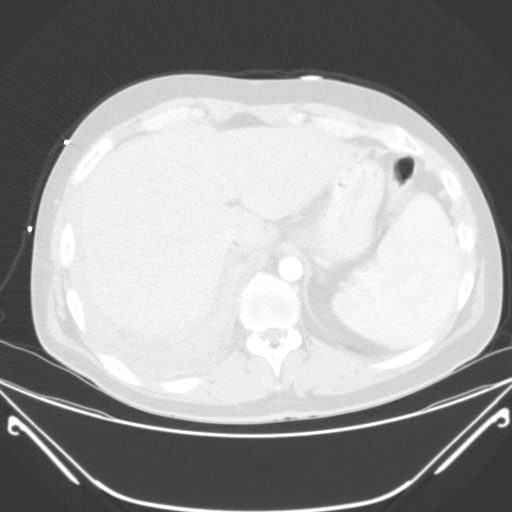
[im 22/66  lung]
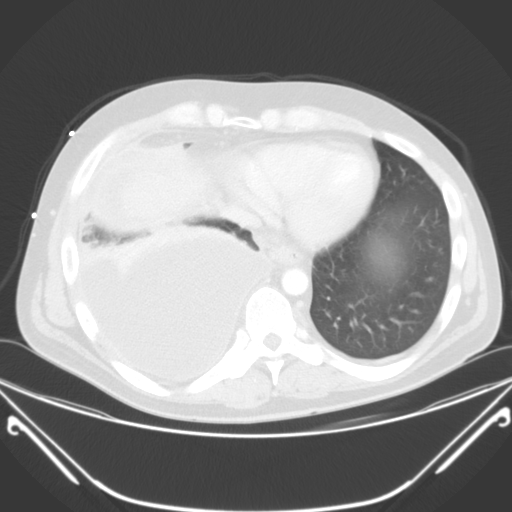
[im 29/66  lung]
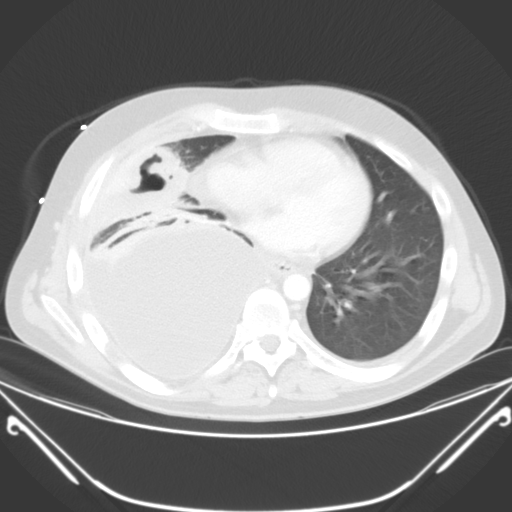
[im 37/66  mediastinal]
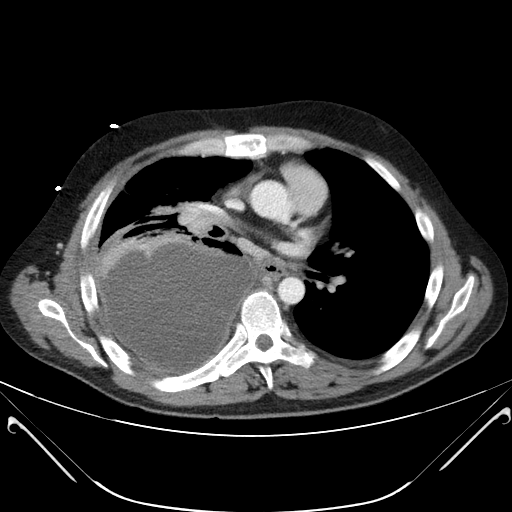
[im 37/66  lung]
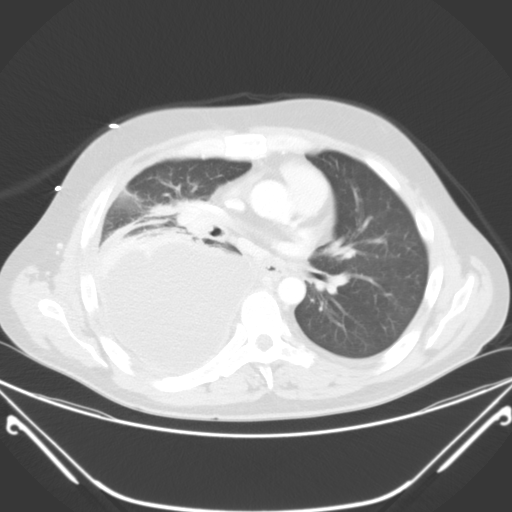
[im 44/66  lung]
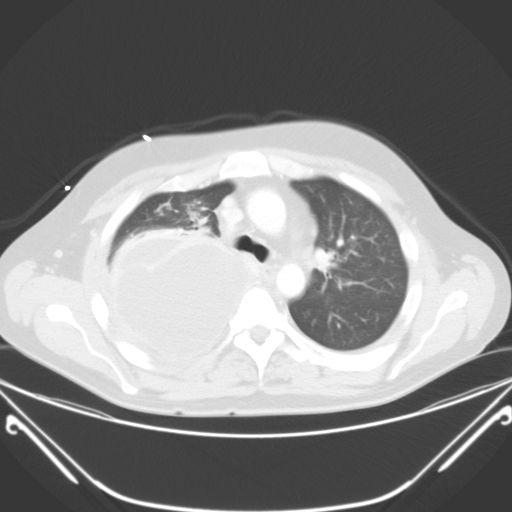
[im 53/66  lung]
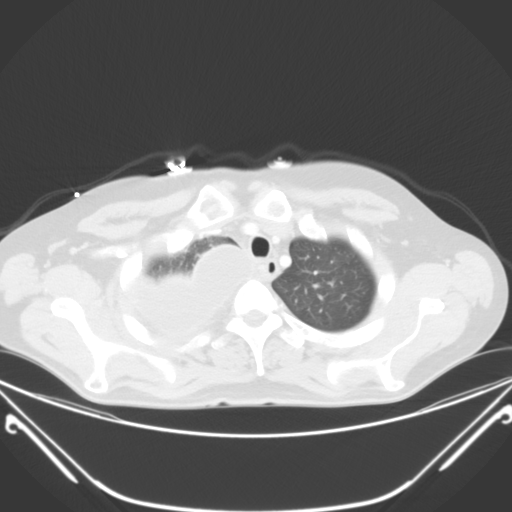
[im 61/66  lung]
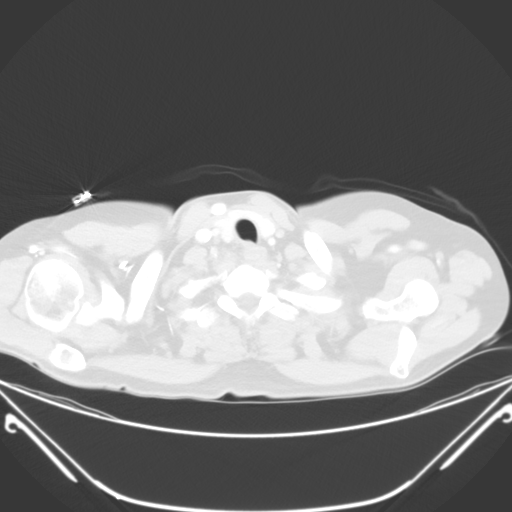

[Series 203: coronal, idose (2) · coronal · 0.45mm/px · 3 of 113 slices shown]
[im 23/113  lung]
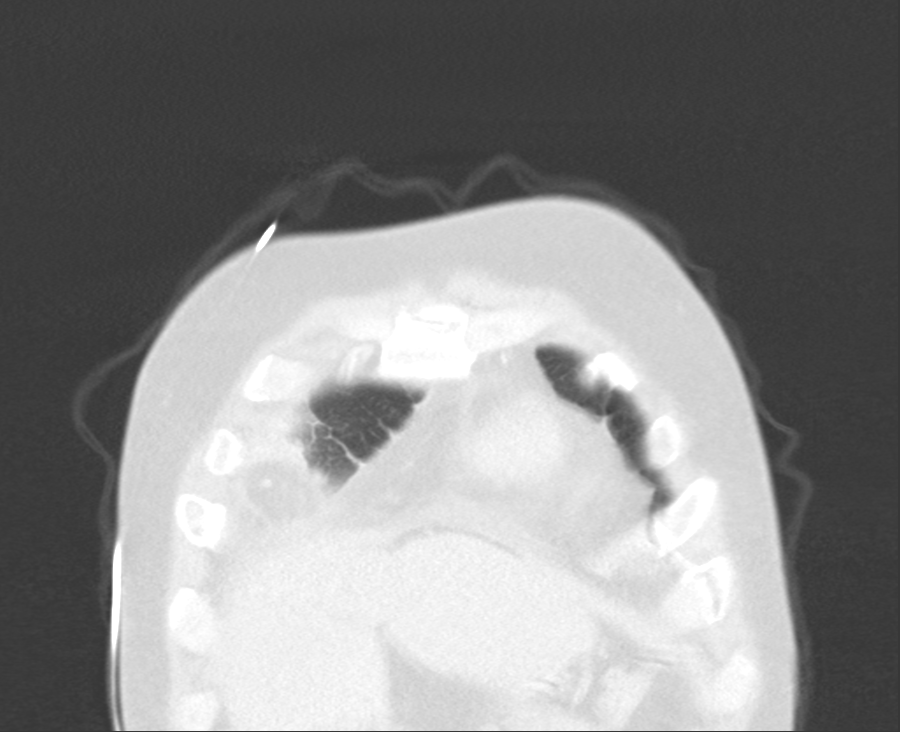
[im 45/113  lung]
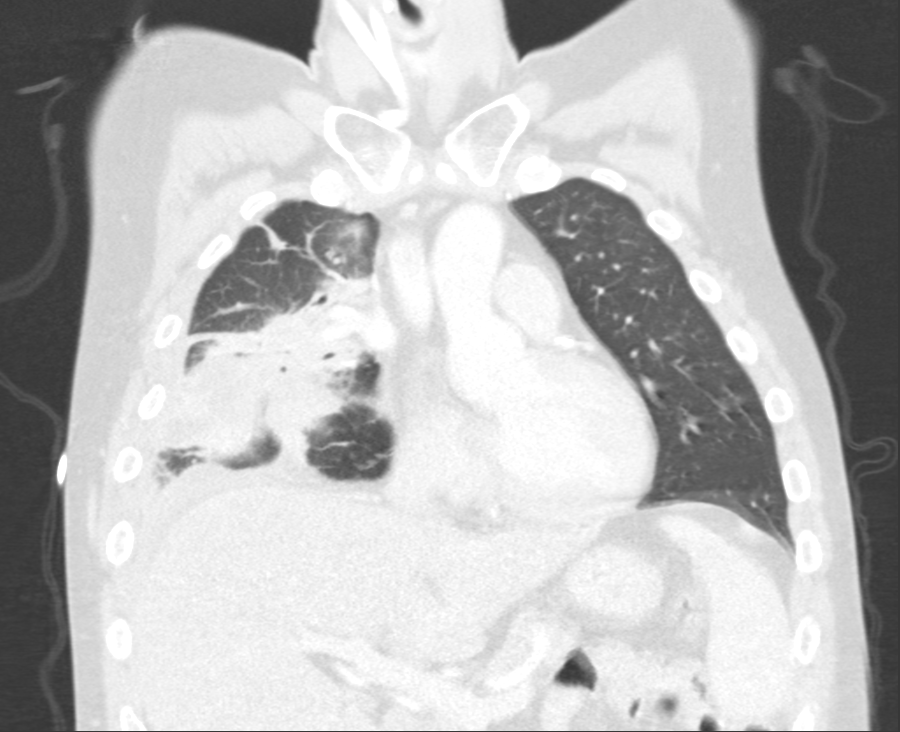
[im 68/113  lung]
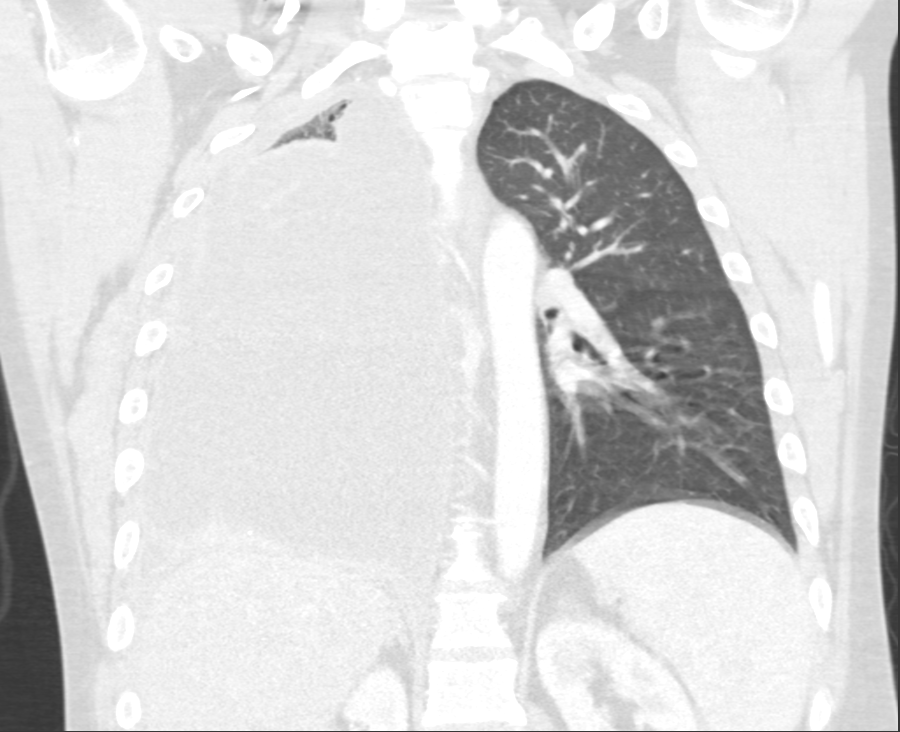

[11 of 36 positions shown; findings below may reference images not displayed]

FINDINGS: A large loculated right-sided pleural effusion is noted, with
surrounding atelectasis. An empyema cannot be excluded. There is a
large 6.9 x 6.7 x 4.4 cm complex collection of fluid and air at the
right middle lobe, with a prominent surrounding rind. This most
likely reflects a lung abscess, though infection of an underlying
necrotic mass cannot be excluded.

The left lung is relatively clear.  No pneumothorax is seen.

A subcarinal node measures up to 1.4 cm in short axis. No additional
mediastinal lymphadenopathy is seen. No pericardial effusion is
identified. The great vessels are grossly unremarkable in
appearance. The visualized portions of thyroid gland are
unremarkable. No axillary lymphadenopathy is seen.

The visualized portions of the liver are grossly unremarkable. The
spleen appears mildly enlarged. The visualized portions of the
pancreas, adrenal glands, kidneys and gallbladder are grossly
unremarkable. An 8 mm lymph node is noted adjacent to the distal
esophagus.

No acute osseous abnormalities are seen. There appears to be a
chronic incompletely healed fracture at the upper body of the
sternum, with associated sclerosis and mild underlying periosteal
change. Underlying infection cannot be excluded.
IMPRESSION: 1. Large 6.9 x 6.7 x 4.4 cm complex collection of fluid and air at
the right middle lobe, with prominent surrounding rind. This most
likely reflects a lung abscess, though infection of an underlying
necrotic mass cannot be excluded. Further evaluation is recommended
as deemed clinically appropriate.
2. Large loculated right-sided pleural effusion, occupying much of
the right lung. Cannot exclude empyema. Surrounding atelectasis
noted.
3. Enlarged subcarinal node, measuring 1.4 cm in short axis. Unusual
8 mm node noted adjacent to the distal esophagus. This may simply
reflect infection, though malignancy cannot be entirely excluded.
4. Mild splenomegaly.
5. Apparent chronic incompletely healed fracture at the upper body
of the sternum, with associated sclerosis and mild underlying
periosteal change. Underlying infection cannot be excluded.

## 2018-02-06 IMAGING — CR DG CHEST 1V PORT
1 series · 1 of 1 positions shown · non-contrast
Comparison: Two days ago

CLINICAL DATA: Right pneumothorax

EXAM:
PORTABLE CHEST 1 VIEW

[AP]
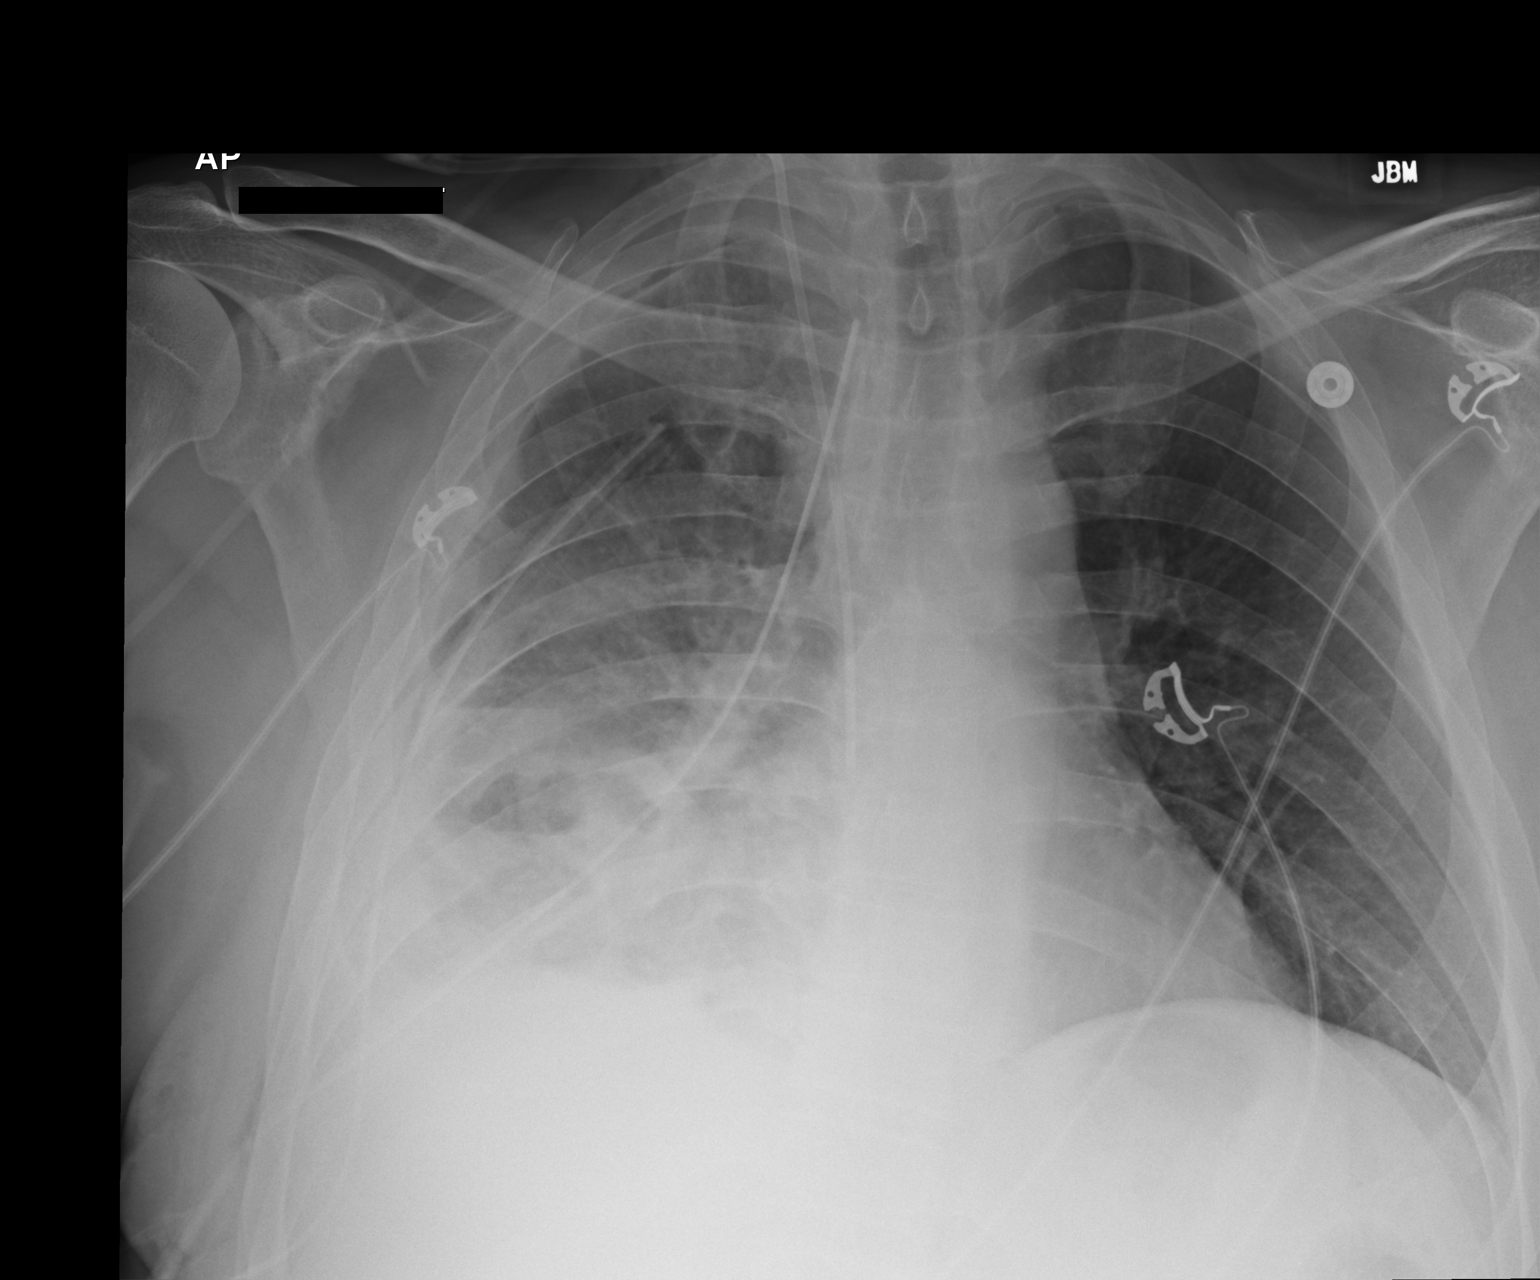

[1 of 1 positions shown; findings below may reference images not displayed]

FINDINGS: Small right apical pneumothorax, 10% or less. Right chest drains in
place. Opacity at the right base from pleural thickening and/or
residual fluid. Right middle lobe abscess as seen on chest CT
12/31/2015. Right IJ central line, tip at the lower SVC level. Clear
left lung.
IMPRESSION: 1. Small right pneumothorax, less than 10%.
2. Right middle lobe abscess and treated empyema.

## 2018-02-07 IMAGING — CR DG CHEST 1V PORT
1 series · 1 of 1 positions shown · non-contrast
Comparison: January 02, 2016 chest radiograph and chest CT
December 31, 2015

CLINICAL DATA: Cough for 2 weeks.  Recent pneumothorax

EXAM:
PORTABLE CHEST 1 VIEW

[AP]
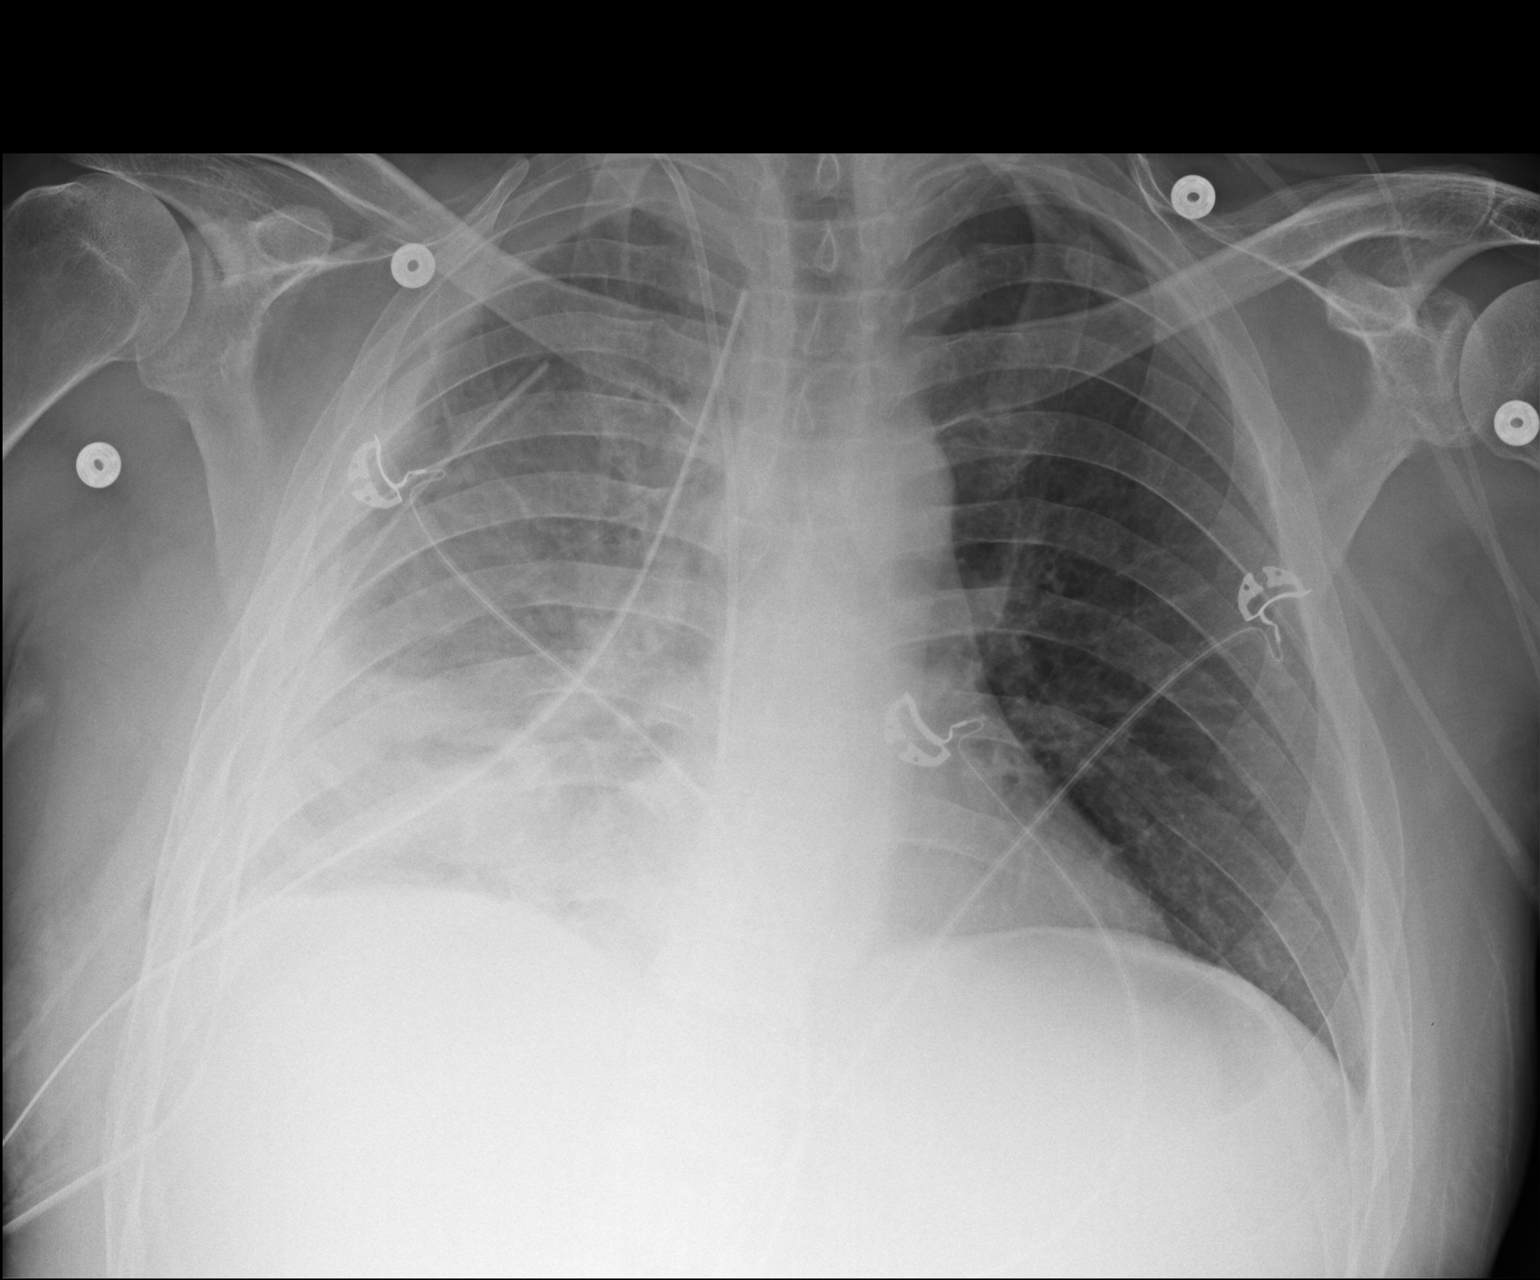

[1 of 1 positions shown; findings below may reference images not displayed]

FINDINGS: Chest tubes remain on the right. Small right apical pneumothorax is
stable. No tension component. Central catheter tip is at the
cavoatrial junction. The apparent abscess in the right middle lobe
is again noted with air collection surrounded by fluid, unchanged.
There apparent layering effusion on the right. No new opacity is
identified on the right compared to 1 day prior. Left lung is clear.
Heart size is within normal limits. The pulmonary vascularity
appears unremarkable. No adenopathy is demonstrable by radiography.
IMPRESSION: Small pneumothorax on the right is stable. No tension component. No
change in tube and catheter positions. Pleural effusion on the right
is felt to be stable as is apparent right middle lobe abscess. Left
lung clear. No change in cardiac silhouette.

## 2018-02-08 IMAGING — CR DG CHEST 1V PORT
1 series · 1 of 1 positions shown · non-contrast
Comparison: Portable exam 5323 hours compared 01/03/2016

CLINICAL DATA: Empyema, history hypertension

EXAM:
PORTABLE CHEST 1 VIEW

[AP]
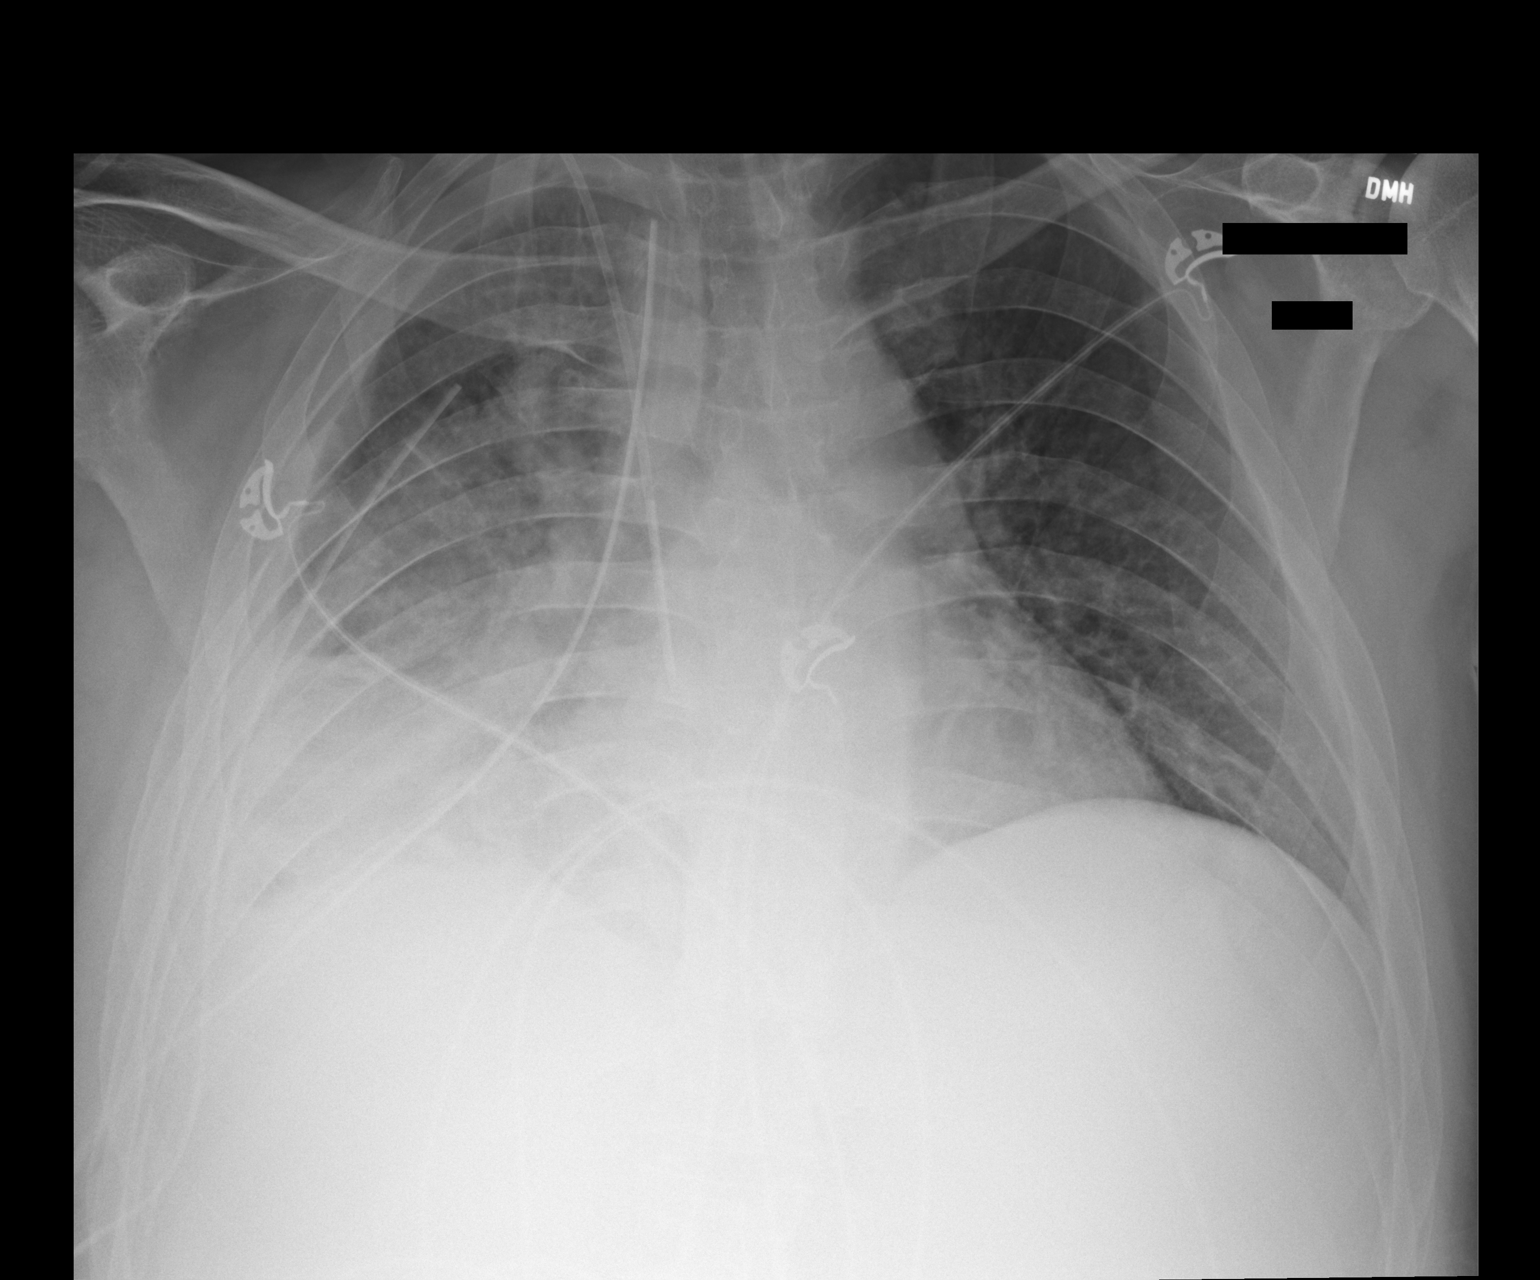

[1 of 1 positions shown; findings below may reference images not displayed]

FINDINGS: Tip of RIGHT jugular line projects over cavoatrial junction.

Pair of RIGHT thoracostomy tubes again identified.

Stable heart size and mediastinal contours.

Persistent pleural fluid versus thickening RIGHT hemi thorax with
atelectasis of mid to lower RIGHT lung and questionable RIGHT lower
lobe infiltrate.

LEFT lung clear.

No pneumothorax.
IMPRESSION: Persistent RIGHT pleural effusion versus thickening.

Persistent atelectasis versus consolidation of mid to lower RIGHT
lung.

No significant interval change.

## 2018-02-09 IMAGING — CR DG CHEST 1V PORT
1 series · 1 of 1 positions shown · non-contrast
Comparison: Portable chest x-ray January 04, 2016.

CLINICAL DATA: Follow-up pneumothorax.

EXAM:
PORTABLE CHEST 1 VIEW

[AP]
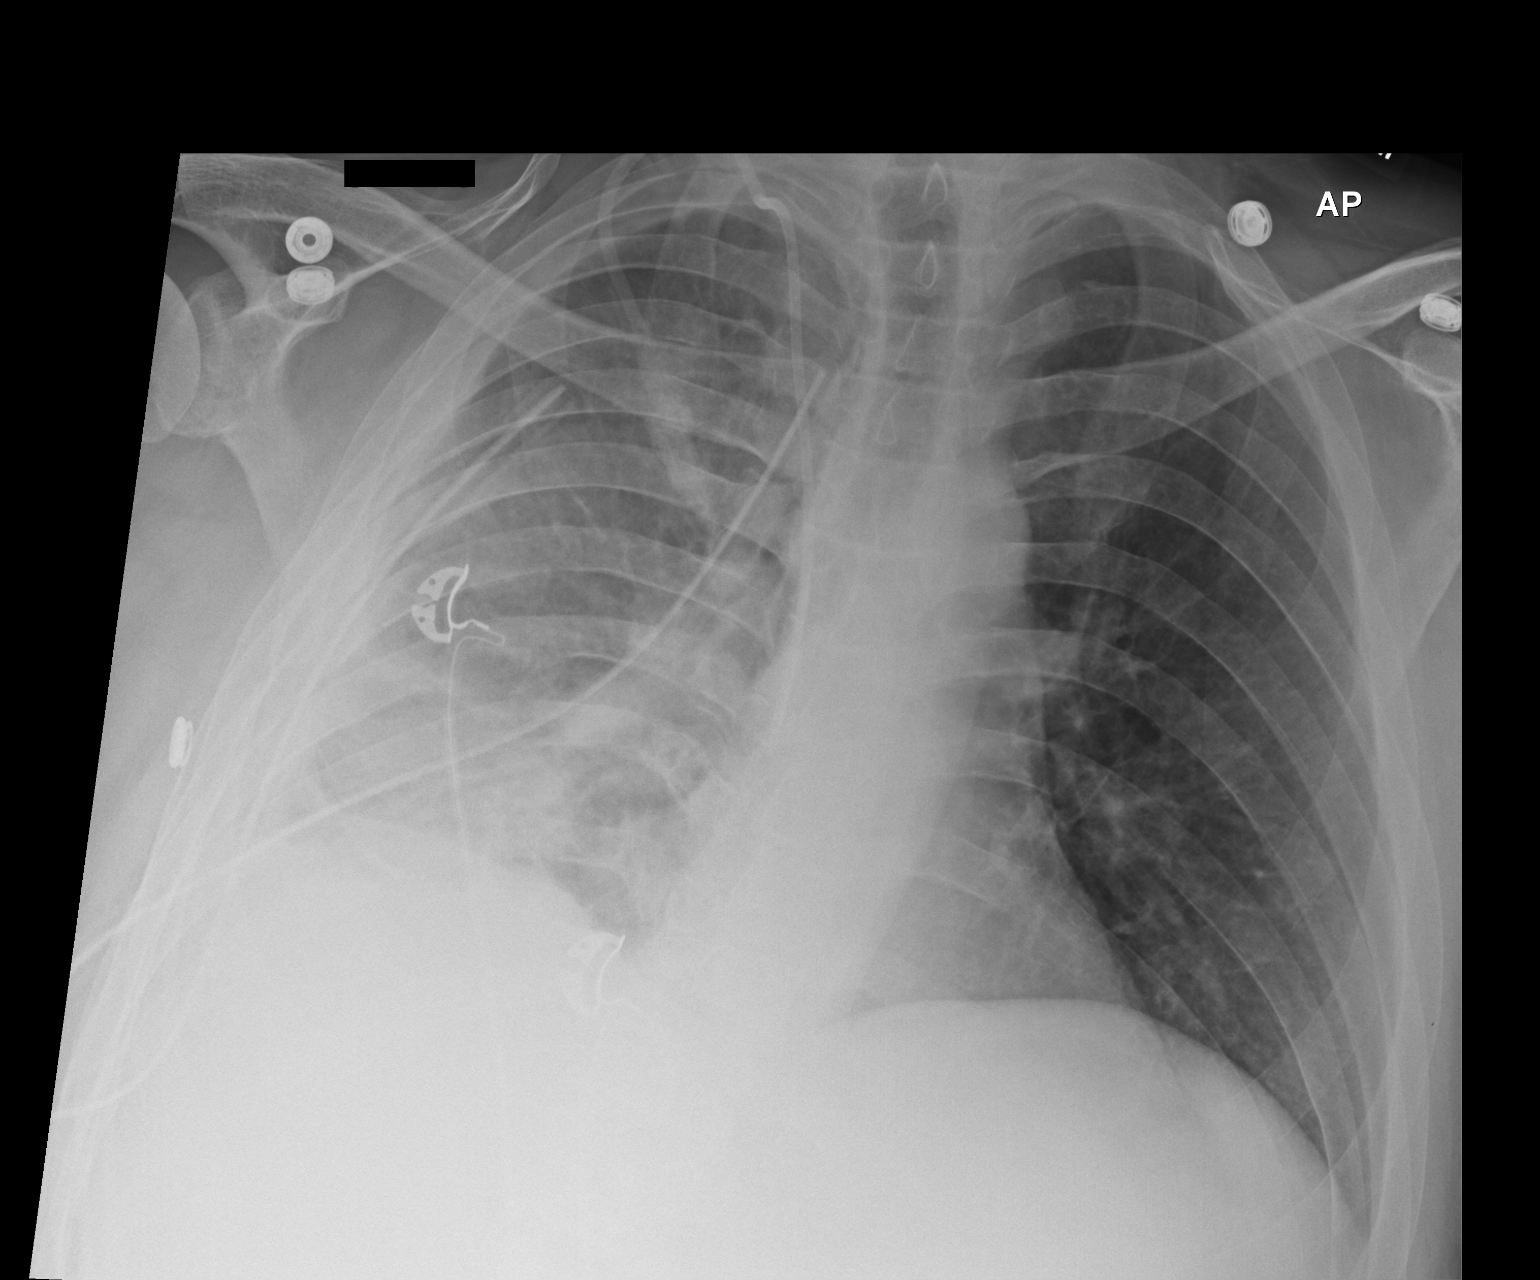

[1 of 1 positions shown; findings below may reference images not displayed]

FINDINGS: On the right there is persistent increased density along the
periphery of the long and at the right lung base consistent with
pleural fluid or pleural thickening. No pneumothorax or
pneumomediastinum is evident. There is persistent increased
interstitial density in the right lung base which is less
conspicuous today. The right-sided chest tubes are in stable
position. There is no mediastinal shift. The left lung is clear. The
heart and pulmonary vascularity are normal. The right internal
jugular venous catheter tip projects over the midportion of the SVC.
IMPRESSION: 1. Improving appearance of the interstitium at the right lung base
consistent with resolving atelectasis or infiltrate. There is stable
pleural thickening or pleural fluid surrounding the convexity of the
right lung with a small amount of the right lung base.
2. The low left lung and the heart and pulmonary vascularity are
normal.
3. Stable appearance of the 2 chest tubes and the right internal
jugular venous catheter.

## 2018-02-12 IMAGING — CR DG CHEST 1V PORT
1 series · 1 of 1 positions shown · non-contrast
Comparison: 01/07/2016

CLINICAL DATA: Chest tube removal

EXAM:
PORTABLE CHEST 1 VIEW

[AP]
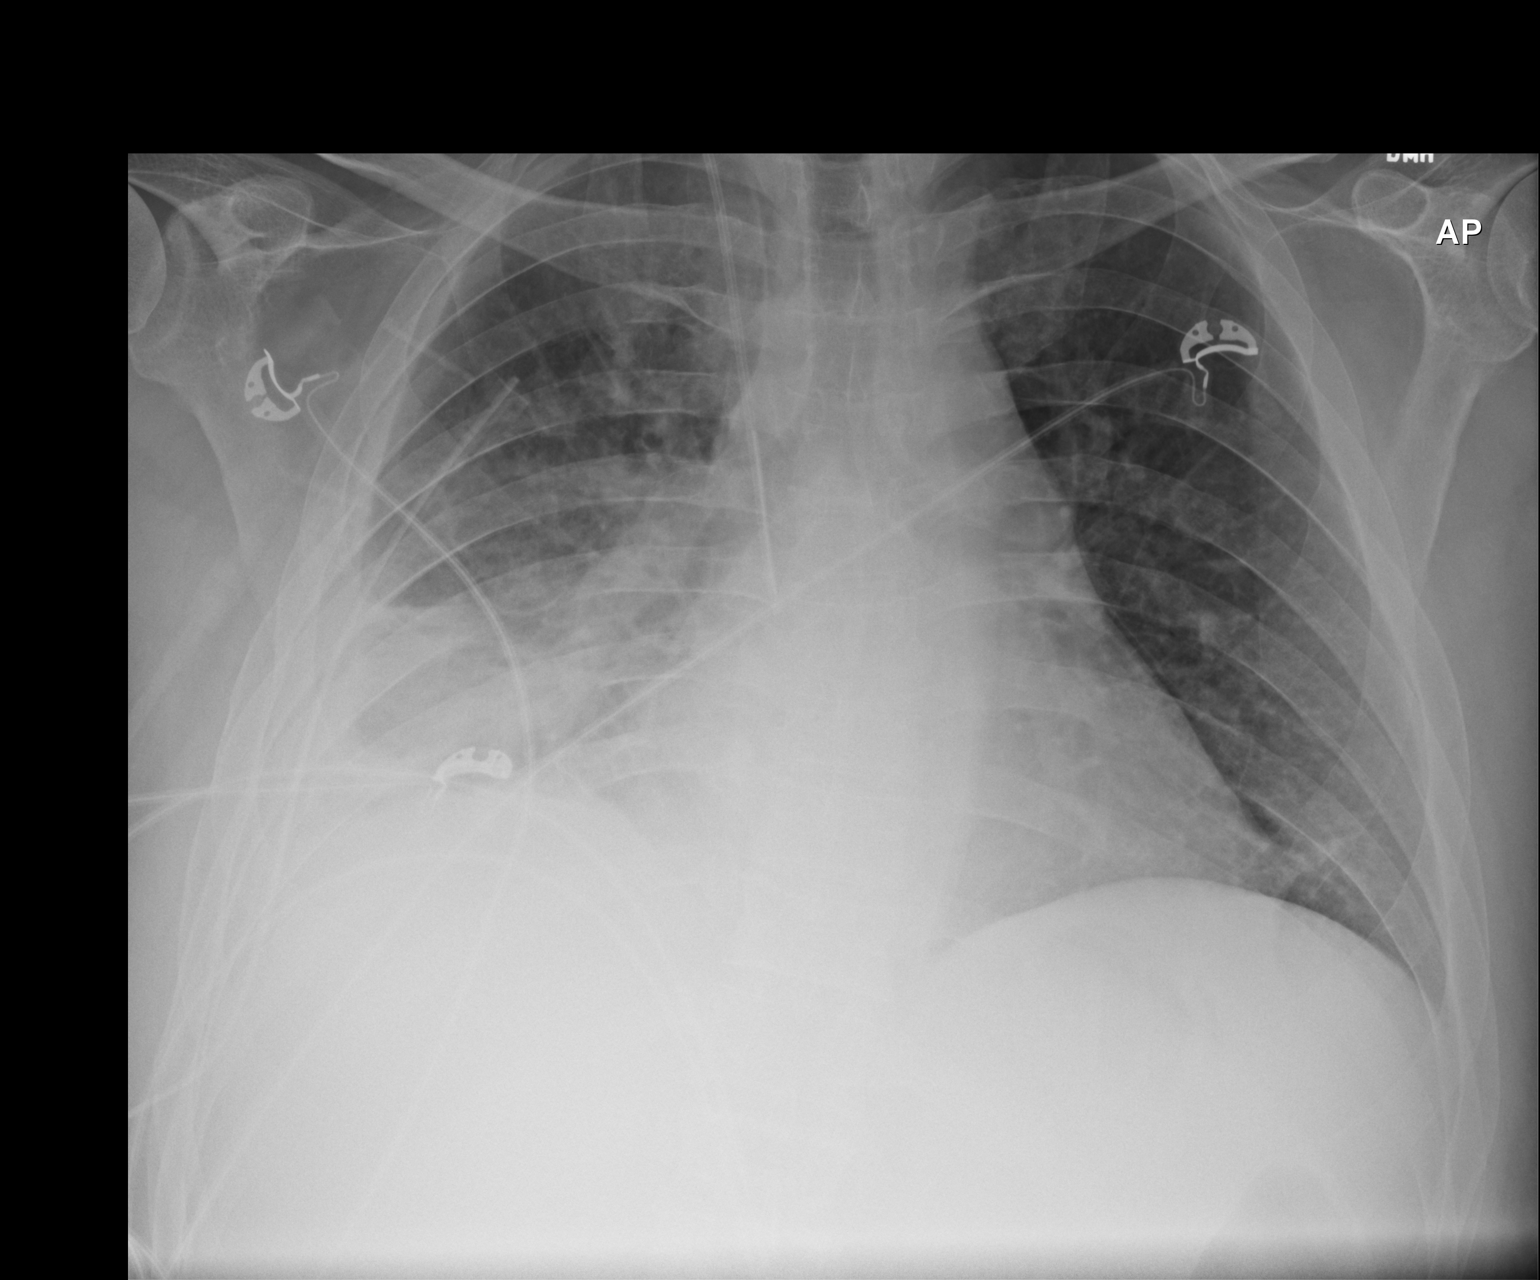

[1 of 1 positions shown; findings below may reference images not displayed]

FINDINGS: Cardiomediastinal silhouette is stable. There is right chest tube
stable in position. The second more medial right chest tube has been
removed. There is no pneumothorax. Persistent small right pleural
effusion with right basilar atelectasis or infiltrate. Stable right
IJ central line position.
IMPRESSION: Stable right chest tube position. The second right chest tube has
been removed. No pneumothorax. Right IJ central line is stable in
position. Again noted small pleural effusion with right basilar
atelectasis or infiltrate.

## 2018-02-13 IMAGING — CR DG CHEST 1V PORT
1 series · 1 of 1 positions shown · non-contrast
Comparison: Prior chest x-ray 01/07/2006

CLINICAL DATA: 39-year-old male with a history of right-sided
empyema

EXAM:
PORTABLE CHEST 1 VIEW

[AP]
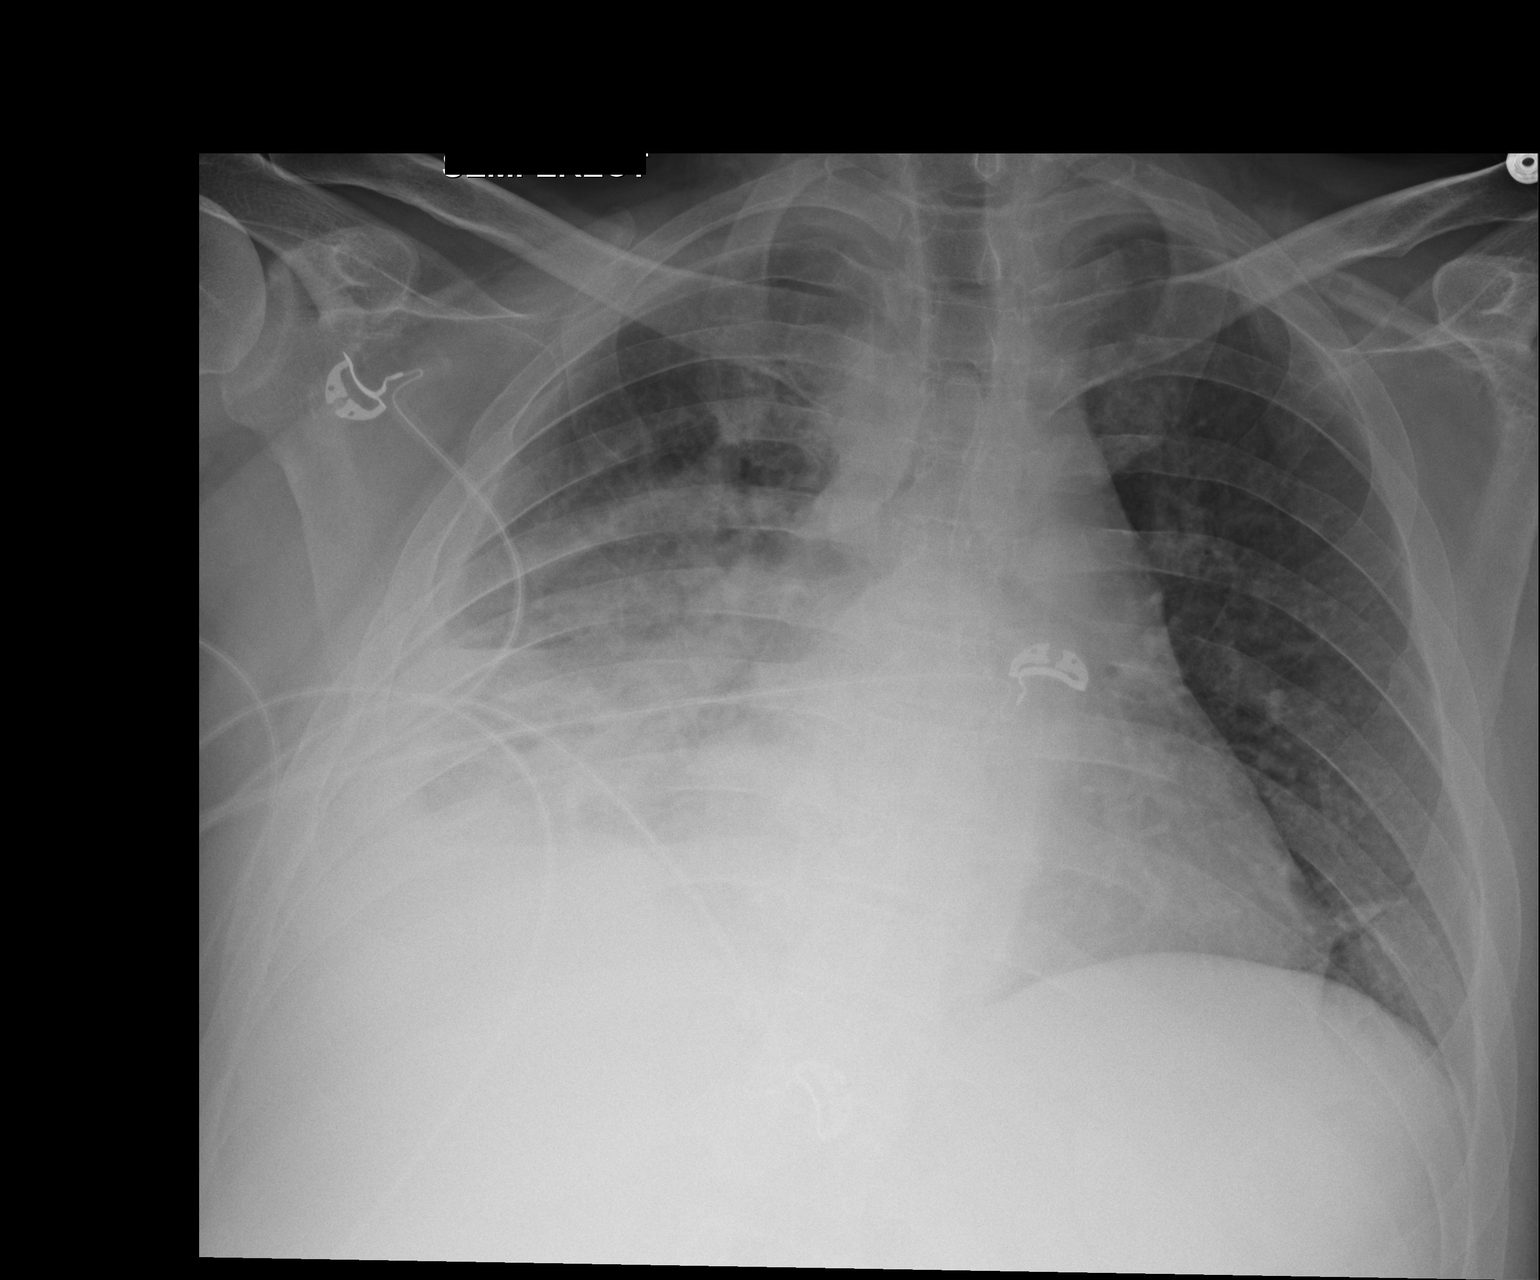

[1 of 1 positions shown; findings below may reference images not displayed]

FINDINGS: Interval removal of right IJ central venous catheter and right-sided
chest tube. No evidence of pneumothorax. Stable cardiac and
mediastinal contours. Persistent right cyst mid and lower lung
patchy airspace opacities and right inferior pleural thickening. The
left lung remains well aerated. No acute osseous abnormality.
IMPRESSION: 1. Interval removal of central venous catheter and right-sided chest
tube.
2. No evidence of pneumothorax.
3. Persistent right lower lung pleural thickening and associated
right mid and lower lung patchy airspace opacities which may reflect
atelectasis or residual infiltrates.

## 2018-02-17 ENCOUNTER — Other Ambulatory Visit: Payer: Self-pay

## 2018-02-17 ENCOUNTER — Encounter (HOSPITAL_COMMUNITY): Payer: Self-pay | Admitting: *Deleted

## 2018-02-17 ENCOUNTER — Emergency Department (HOSPITAL_COMMUNITY): Payer: Medicare Other

## 2018-02-17 ENCOUNTER — Emergency Department (HOSPITAL_COMMUNITY)
Admission: EM | Admit: 2018-02-17 | Discharge: 2018-02-17 | Disposition: A | Discharge status: Home / Self Care | Payer: Medicare Other | Attending: Emergency Medicine | Admitting: Emergency Medicine

## 2018-02-17 DIAGNOSIS — I110 Hypertensive heart disease with heart failure: Secondary | ICD-10-CM | POA: Insufficient documentation

## 2018-02-17 DIAGNOSIS — J441 Chronic obstructive pulmonary disease with (acute) exacerbation: Secondary | ICD-10-CM

## 2018-02-17 DIAGNOSIS — F17210 Nicotine dependence, cigarettes, uncomplicated: Secondary | ICD-10-CM | POA: Insufficient documentation

## 2018-02-17 DIAGNOSIS — R072 Precordial pain: Secondary | ICD-10-CM | POA: Diagnosis not present

## 2018-02-17 DIAGNOSIS — Z79899 Other long term (current) drug therapy: Secondary | ICD-10-CM | POA: Insufficient documentation

## 2018-02-17 DIAGNOSIS — R079 Chest pain, unspecified: Secondary | ICD-10-CM | POA: Diagnosis present

## 2018-02-17 DIAGNOSIS — I509 Heart failure, unspecified: Secondary | ICD-10-CM | POA: Insufficient documentation

## 2018-02-17 LAB — COMPREHENSIVE METABOLIC PANEL
ALT: 24 U/L (ref 17–63)
AST: 26 U/L (ref 15–41)
Albumin: 3.9 g/dL (ref 3.5–5.0)
Alkaline Phosphatase: 48 U/L (ref 38–126)
Anion gap: 11 (ref 5–15)
BUN: 6 mg/dL (ref 6–20)
CHLORIDE: 102 mmol/L (ref 101–111)
CO2: 25 mmol/L (ref 22–32)
Calcium: 9 mg/dL (ref 8.9–10.3)
Creatinine, Ser: 0.65 mg/dL (ref 0.61–1.24)
GFR calc Af Amer: >60 mL/min (ref >60)
Glucose, Bld: 104 mg/dL — ABNORMAL HIGH (ref 65–99)
Potassium: 3.4 mmol/L — ABNORMAL LOW (ref 3.5–5.1)
SODIUM: 138 mmol/L (ref 135–145)
Total Bilirubin: 0.6 mg/dL (ref 0.3–1.2)
Total Protein: 6.4 g/dL — ABNORMAL LOW (ref 6.5–8.1)

## 2018-02-17 LAB — CBC WITH DIFFERENTIAL/PLATELET
Basophils Absolute: 0 10*3/uL (ref 0.0–0.1)
Basophils Relative: 1 %
Eosinophils Absolute: 0.1 10*3/uL (ref 0.0–0.7)
Eosinophils Relative: 2 %
HCT: 38.1 % — ABNORMAL LOW (ref 39.0–52.0)
HEMOGLOBIN: 13.3 g/dL (ref 13.0–17.0)
LYMPHS ABS: 1.5 10*3/uL (ref 0.7–4.0)
Lymphocytes Relative: 24 %
MCH: 32.4 pg (ref 26.0–34.0)
MCHC: 34.9 g/dL (ref 30.0–36.0)
MCV: 92.9 fL (ref 78.0–100.0)
Monocytes Absolute: 0.8 10*3/uL (ref 0.1–1.0)
Monocytes Relative: 14 %
NEUTROS PCT: 59 %
Neutro Abs: 3.7 10*3/uL (ref 1.7–7.7)
Platelets: 188 10*3/uL (ref 150–400)
RBC: 4.1 MIL/uL — AB (ref 4.22–5.81)
RDW: 13.2 % (ref 11.5–15.5)
WBC: 6.2 10*3/uL (ref 4.0–10.5)

## 2018-02-17 LAB — TROPONIN I: Troponin I: <0.03 ng/mL (ref <0.03)

## 2018-02-17 LAB — BRAIN NATRIURETIC PEPTIDE: B NATRIURETIC PEPTIDE 5: 22 pg/mL (ref 0.0–100.0)

## 2018-02-17 LAB — VALPROIC ACID LEVEL: Valproic Acid Lvl: 53 ug/mL (ref 50.0–100.0)

## 2018-02-17 LAB — ETHANOL: Alcohol, Ethyl (B): <10 mg/dL (ref <10)

## 2018-02-17 LAB — LIPASE, BLOOD: Lipase: 30 U/L (ref 11–51)

## 2018-02-17 IMAGING — CR DG CHEST 2V
2 series · 2 of 2 positions shown · non-contrast
Comparison: CT chest 12/31/2015. Single view of the chest
01/09/2016.

CLINICAL DATA: Right chest pain. The patient was discharged from
the hospital 01/11/2016 after admission and treatment for right
empyema. Initial encounter.

EXAM:
CHEST  2 VIEW

[w chest lat]
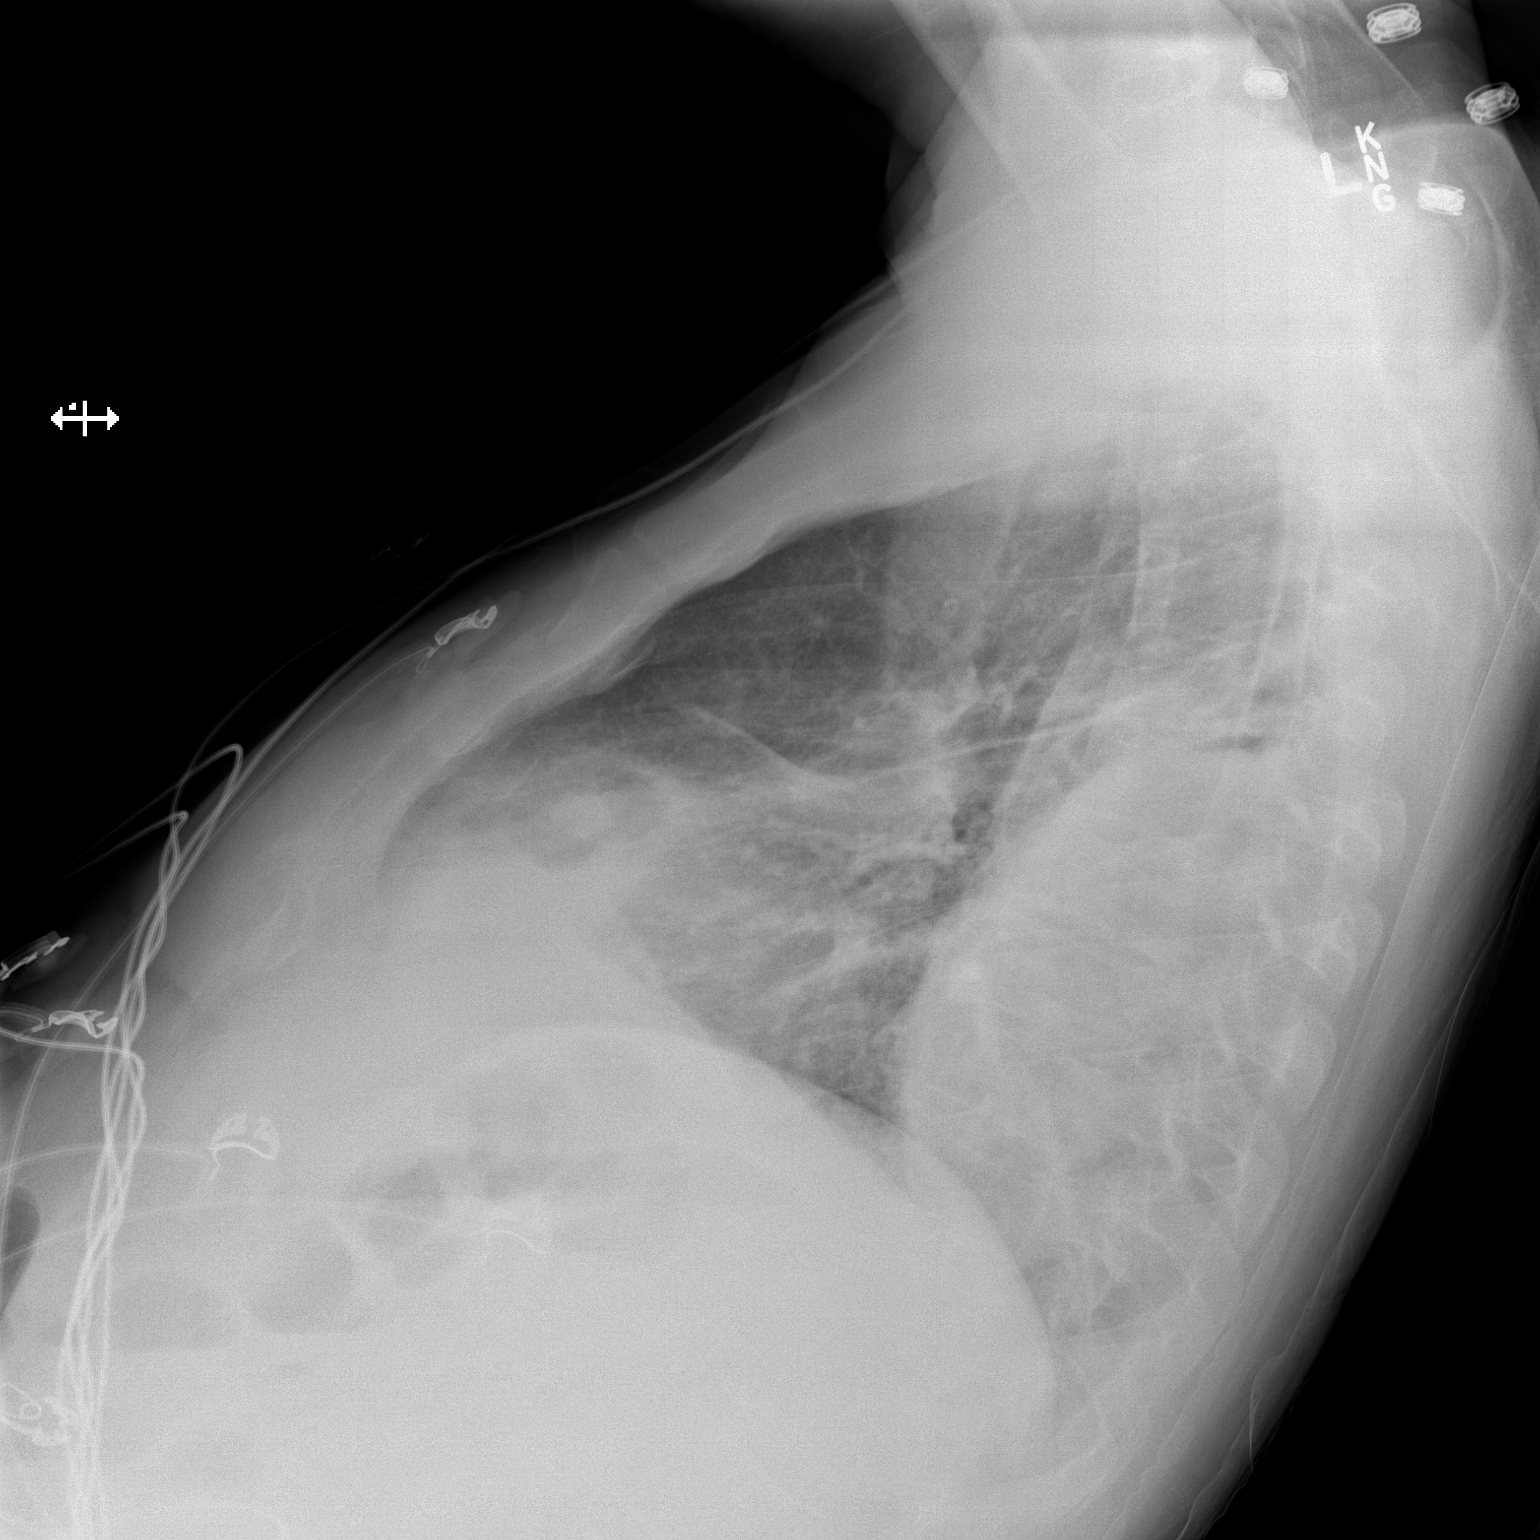

[x chest ap]
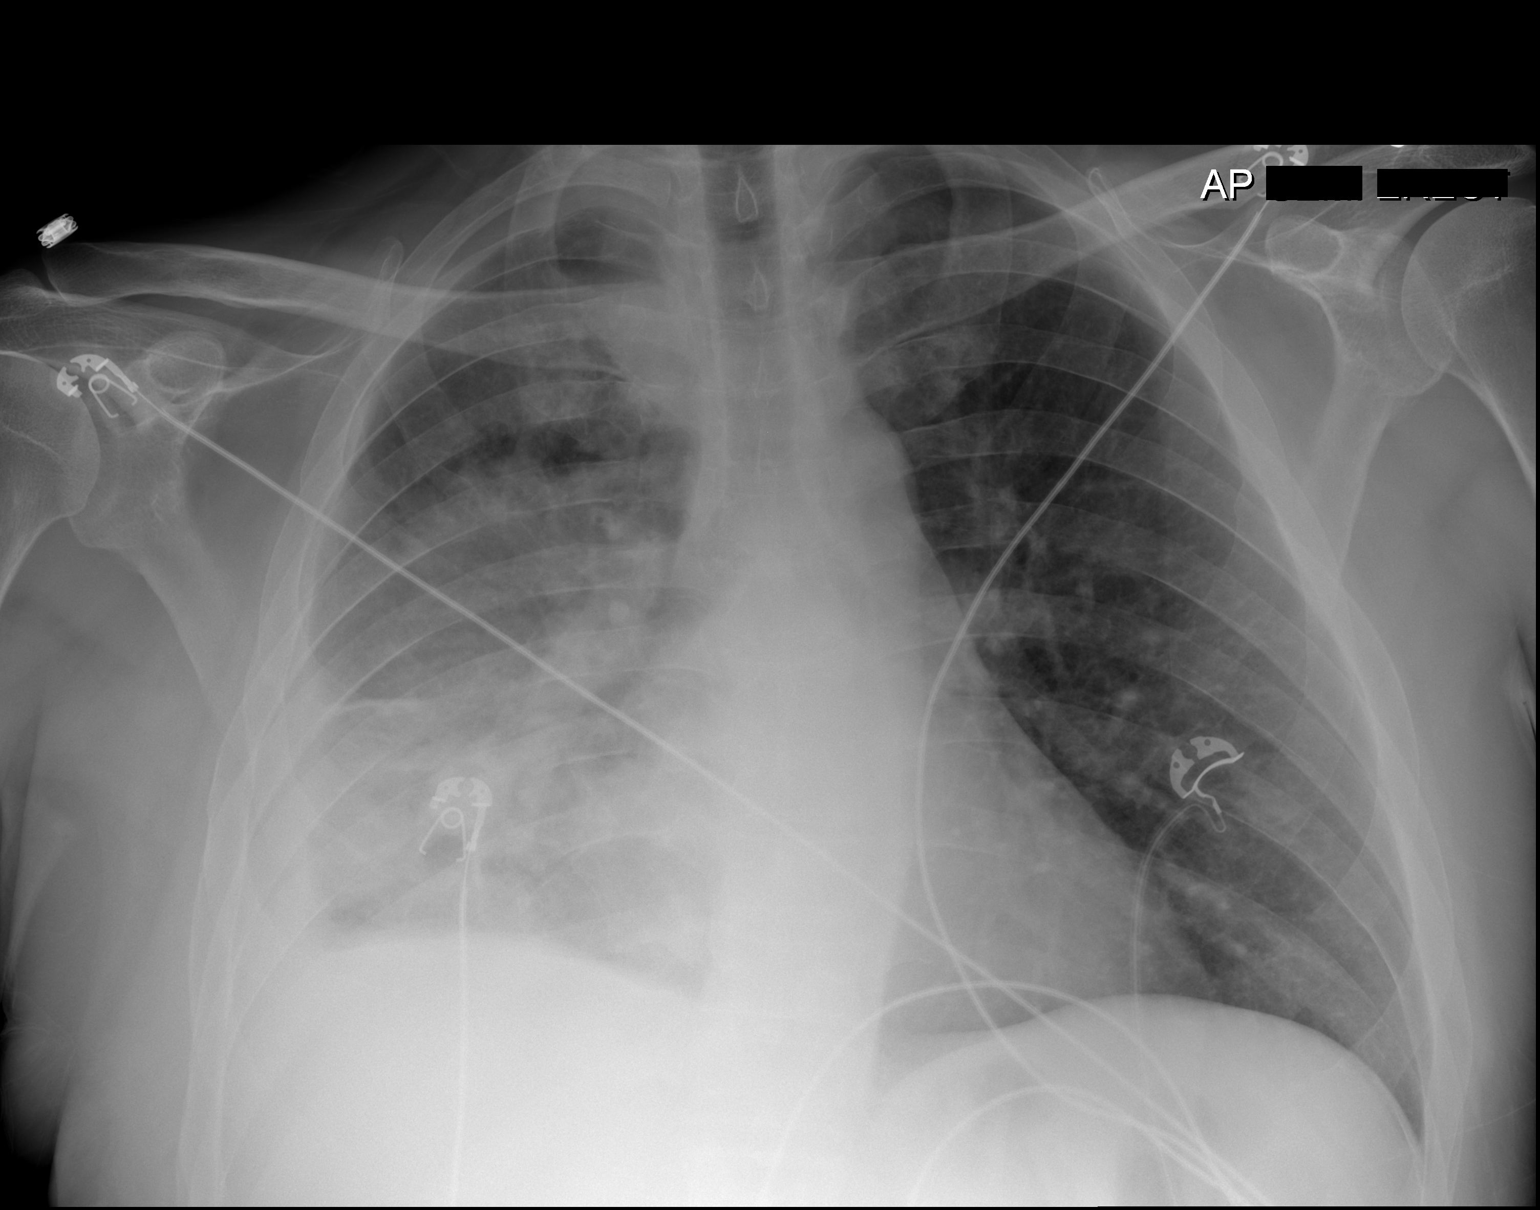

[2 of 2 positions shown; findings below may reference images not displayed]

FINDINGS: There is a moderate to moderately large right pleural effusion which
includes a loculated component on the lateral view of the posterior
chest. There is airspace disease on the right. Aeration in the right
chest appears improved since the most recent exam. The left lung is
clear. No pneumothorax is identified. Heart size is normal.
IMPRESSION: Aeration in the right chest appears improved since the most recent
comparison study but there is a persistent loculated appearing right
pleural fluid collection in the posterior right chest.

Negative for pneumothorax.

## 2018-02-17 MED ORDER — FENTANYL CITRATE (PF) 100 MCG/2ML IJ SOLN
50.0000 ug | Freq: Once | INTRAMUSCULAR | Status: DC
  Filled 2018-02-17: qty 2

## 2018-02-17 MED ORDER — DOXYCYCLINE HYCLATE 100 MG PO CAPS: 100.0000 mg | ORAL_CAPSULE | Freq: Two times a day (BID) | ORAL | 0 refills | Status: DC

## 2018-02-17 MED ORDER — NITROGLYCERIN 0.4 MG SL SUBL: 0.4000 mg | SUBLINGUAL_TABLET | SUBLINGUAL | Status: DC | PRN

## 2018-02-17 MED ORDER — HYDROCODONE-ACETAMINOPHEN 5-325 MG PO TABS
1.0000 | ORAL_TABLET | Freq: Once | ORAL | Status: AC
Start: 2018-02-17 — End: 2018-02-17
  Administered 2018-02-17: 1 via ORAL
  Filled 2018-02-17: qty 1

## 2018-02-17 MED ORDER — ALBUTEROL (5 MG/ML) CONTINUOUS INHALATION SOLN
10.0000 mg/h | INHALATION_SOLUTION | Freq: Once | RESPIRATORY_TRACT | Status: AC
  Administered 2018-02-17: 10 mg/h via RESPIRATORY_TRACT
  Filled 2018-02-17: qty 20

## 2018-02-17 MED ORDER — ALBUTEROL SULFATE (2.5 MG/3ML) 0.083% IN NEBU
5.0000 mg | INHALATION_SOLUTION | Freq: Once | RESPIRATORY_TRACT | Status: AC
  Administered 2018-02-17: 5 mg via RESPIRATORY_TRACT

## 2018-02-17 MED ORDER — DOXYCYCLINE HYCLATE 100 MG PO TABS
100.0000 mg | ORAL_TABLET | Freq: Once | ORAL | Status: AC
  Administered 2018-02-17: 100 mg via ORAL
  Filled 2018-02-17: qty 1

## 2018-02-17 MED ORDER — PREDNISONE 50 MG PO TABS: ORAL_TABLET | ORAL | 0 refills | Status: DC

## 2018-02-17 MED ORDER — IPRATROPIUM-ALBUTEROL 0.5-2.5 (3) MG/3ML IN SOLN
3.0000 mL | Freq: Once | RESPIRATORY_TRACT | Status: AC
  Administered 2018-02-17: 3 mL via RESPIRATORY_TRACT
  Filled 2018-02-17: qty 3

## 2018-02-17 MED ORDER — OXYCODONE-ACETAMINOPHEN 5-325 MG PO TABS
1.0000 | ORAL_TABLET | Freq: Once | ORAL | Status: AC
  Administered 2018-02-17: 1 via ORAL
  Filled 2018-02-17: qty 1

## 2018-02-17 MED ORDER — ALBUTEROL SULFATE HFA 108 (90 BASE) MCG/ACT IN AERS: 1.0000 | INHALATION_SPRAY | Freq: Four times a day (QID) | RESPIRATORY_TRACT | 0 refills | Status: DC | PRN

## 2018-02-17 MED ORDER — PREDNISONE 10 MG PO TABS
60.0000 mg | ORAL_TABLET | Freq: Once | ORAL | Status: AC
  Administered 2018-02-17: 60 mg via ORAL
  Filled 2018-02-17: qty 1

## 2018-02-17 NOTE — ED Notes (Signed)
Pt picke dup by [facility] staff.

## 2018-02-17 NOTE — ED Notes (Signed)
Upon entering pts room, slouched over and sleeping in the room. Pt did not respond to voice. Pt had to be shaken to arouse.

## 2018-02-17 NOTE — ED Triage Notes (Signed)
Pt brought in by [facility] from [facility] for chest pain and sob; pt has wheezing and rhonchi present; albuterol 2.5 given by ems; pt states he has had sx x 4 days with coughing up green sputum; cbg 223; pt states he has had n/v

## 2018-02-17 NOTE — Discharge Instructions (Signed)

## 2018-02-17 NOTE — ED Provider Notes (Signed)
[facility]Y [facility]PART[facility] Provider Note   CSN: [id number] Arrival date & time: 02/17/18  0000     History   Chief Complaint Chief Complaint  Patient presents with  . Chest Pain    HPI [name] is a 42 y.o. male.  The history is provided by the patient.  Chest Pain   This is a new problem. The current episode started more than 2 days ago. The problem occurs daily. The problem has been gradually worsening. The pain is associated with rest. The pain is present in the substernal region. The pain is moderate. The pain does not radiate. Associated symptoms include cough and shortness of breath. Pertinent negatives include no fever. He has tried nothing for the symptoms.   Patient presents from group home.  He has a history of COPD, alcohol abuse, seizures.  He reports the past 2-3 days been having chest pain and pressure.  He also reports shortness of breath.  He also reports cough.  He reports green sputum.  No fevers have been reported.  He reports he has a history of heart failure, and reports swelling in his lower legs. Past Medical History:  Diagnosis Date  . Anxiety   . Asthma   . CHF (congestive heart failure) (HCC)   . Depression   . Encephalopathy   . GERD (gastroesophageal reflux disease)   . Hypertension   . Hyponatremia 06/2016  . Renal disorder    kidney injury  . Seizures [facility](HCC)     Patient Active Problem List   Diagnosis Date Noted  . Chronic respiratory failure with hypoxia (HCC) 05/03/2017  . COPD (chronic obstructive pulmonary disease) (HCC) 05/03/2017  . Pedal edema 05/03/2017  . Altered mental state 05/02/2017  . Acute on chronic respiratory failure with hypoxia (HCC) 04/05/2017  . COPD exacerbation (HCC) 04/03/2017  . Musculoskeletal pain 06/28/2016  . Left flank pain   . Hyponatremia 06/27/2016  . Acute encephalopathy 01/06/2016  . 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 Pleural effusion, right 12/31/2015  . GERD  (gastroesophageal reflux disease) 11/11/2015  . Borderline personality disorder (HCC) 11/11/2015  . Alcohol use disorder, severe, in controlled environment (HCC) 11/11/2015  . 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 Tobacco abuse 11/05/2013  . HTN (hypertension) 11/05/2013    Past Surgical History:  Procedure Laterality Date  . FINGER SURGERY Left    5th digit  . PLEURAL EFFUSION DRAINAGE Right 01/02/2016   Procedure: DRAINAGE OF PLEURAL EFFUSION;  Surgeon: Loreli SlotSteven C Hendrickson, MD;  [facility];  Service: Thoracic;  Laterality: Right;  Marland Kitchen. VIDEO ASSISTED THORACOSCOPY (VATS)/DECORTICATION Right 01/02/2016   Procedure: VIDEO ASSISTED THORACOSCOPY (VATS)/DECORTICATION;  Surgeon: Loreli SlotSteven C Hendrickson, MD;  [facility];  Service: Thoracic;  Laterality: Right;  Marland Kitchen. VIDEO BRONCHOSCOPY N/A 01/02/2016   Procedure: VIDEO BRONCHOSCOPY;  Surgeon: Loreli SlotSteven C Hendrickson, MD;  [facility];  Service: Thoracic;  Laterality: N/A;        Home Medications    Prior to Admission medications   Medication Sig Start Date End Date Taking? Authorizing Provider  albuterol (PROVENTIL HFA;VENTOLIN HFA) 108 (90 Base) MCG/ACT inhaler Inhale 2 puffs into the lungs every 4 (four) hours as needed for wheezing or shortness of breath.    [provider]  cyclobenzaprine (FLEXERIL) 10 MG tablet Take 10 mg by mouth every 8 (eight) hours as needed for muscle spasms.    [provider]  disulfiram (ANTABUSE) 250 MG tablet Take 250 mg by mouth daily.    [provider]  divalproex (DEPAKOTE ER) 500 MG 24 hr tablet Take 4 tablets (2,000 mg total) by mouth at bedtime. 11/13/15   Jimmy Footman, MD  escitalopram (LEXAPRO) 20 MG tablet Take 20 mg by mouth every morning.    [provider]  esomeprazole (NEXIUM) 40 MG capsule Take 40 mg by mouth daily at 12 noon.     [provider]  ferrous sulfate 325 (65 FE) MG tablet Take 1 tablet (325 mg total) by mouth 2 (two) times daily with a meal. 01/11/16   Regalado, Belkys A, MD  Fluticasone-Salmeterol (ADVAIR) 250-50 MCG/DOSE AEPB Inhale 1 puff into the lungs 2 (two) times daily.    [provider]  furosemide (LASIX) 20 MG tablet Take 1 tablet (20 mg total) by mouth daily. 04/22/17   Jacalyn Lefevre, MD  gabapentin (NEURONTIN) 300 MG capsule Take 300 mg by mouth 3 (three) times daily.    [provider]  hydrOXYzine (ATARAX/VISTARIL) 50 MG tablet Take 1 tablet (50 mg total) by mouth 3 (three) times daily. 05/07/17   Erick Blinks, MD  ibuprofen (ADVIL,MOTRIN) 800 MG tablet Take 800 mg by mouth 3 (three) times daily as needed for moderate pain.    [provider]  lactulose (CHRONULAC) 10 GM/15ML solution Take 45 mLs (30 g total) by mouth 2 (two) times daily. 05/07/17   Erick Blinks, MD  Melatonin 5 MG TABS Take 10 mg by mouth at bedtime.    [provider]  Multiple Vitamins-Minerals (THEREMS-M) TABS Take 1 tablet by mouth daily.    [provider]  primidone (MYSOLINE) 250 MG tablet Take 250 mg by mouth 3 (three) times daily.    [provider]  QUEtiapine (SEROQUEL) 200 MG tablet Take 200 mg by mouth at bedtime.    [provider]  traMADol (ULTRAM) 50 MG tablet Take 1 tablet (50 mg total) by mouth every 6 (six) hours as needed. 05/11/17   Bethann Berkshire, MD  traMADol (ULTRAM) 50 MG tablet Take 1 tablet (50 mg total) by mouth every 6 (six) hours as needed. 05/11/17   Bethann Berkshire, MD    Family History Family History  Problem Relation Age of Onset  . CAD Mother   . Diabetes Mellitus II Father   . Prostate cancer Paternal Grandfather     Social History Social History   Tobacco Use  . Smoking status: Current Every Day Smoker    Packs/day: 0.50    Years: 20.00    Pack years: 10.00    Types: Cigarettes  . Smokeless tobacco: Never  Used  Substance Use Topics  . Alcohol use: No    Comment: pt states he no longer drinks  . Drug use: No     Allergies   Penicillins   Review of Systems Review of Systems  Constitutional: Negative for fever.  Respiratory: Positive for cough and shortness of breath.   Cardiovascular: Positive for chest pain.  All other systems reviewed and are negative.    Physical Exam Updated Vital Signs BP 121/73   Pulse 68   Temp 98 F (36.7 C) (Oral)   Resp 20   Ht 1.753 m (5\' 9" )   Wt 71.2 kg (157 lb)   SpO2 92%   BMI 23.18 kg/m   Physical Exam CONSTITUTIONAL: Chronically ill-appearing, appears older than stated age, anxious HEAD: Normocephalic/atraumatic EYES: EOMI/PERRL ENMT: Mucous membranes moist NECK: supple  no meningeal signs SPINE/BACK:entire spine nontender CV: S1/S2 noted, no murmurs/rubs/gallops noted LUNGS: Scattered wheeze bilaterally, no acute distress ABDOMEN: soft, mild diffuse tenderness, no rebound or guarding, bowel sounds noted throughout abdomen GU:no cva tenderness NEURO: Pt is awake/alert/appropriate, moves all extremitiesx4.  No facial droop.   EXTREMITIES: pulses normal/equal, full ROM, no lower extremity edema SKIN: warm, color normal PSYCH: Anxious   ED Treatments / Results  Labs (all labs ordered are listed, but only abnormal results are displayed) Labs Reviewed  COMPREHENSIVE METABOLIC PANEL - Abnormal; Notable for the following components:      Result Value   Potassium 3.4 (*)    Glucose, Bld 104 (*)    Total Protein 6.4 (*)    All other components within normal limits  CBC WITH DIFFERENTIAL/PLATELET - Abnormal; Notable for the following components:   RBC 4.10 (*)    HCT 38.1 (*)    All other components within normal limits  TROPONIN I  ETHANOL  VALPROIC ACID LEVEL  BRAIN NATRIURETIC PEPTIDE  TROPONIN I  LIPASE, BLOOD    EKG EKG Interpretation  Date/Time:  Saturday February 17 2018 00:09:43 EDT Ventricular Rate:  69 PR  Interval:    QRS Duration: 99 QT Interval:  429 QTC Calculation: 460 R Axis:   80 Text Interpretation:  Sinus rhythm RSR' in V1 or V2, right VCD or RVH No significant change since last tracing Confirmed by Zadie Rhine ([id number]) on 02/17/2018 12:59:08 AM   Radiology Dg Chest Port 1 View  Result Date: 02/17/2018 CLINICAL DATA:  42 year old male with shortness of breath and cough. EXAM: PORTABLE CHEST 1 VIEW COMPARISON:  Chest radiograph dated 05/06/2017 FINDINGS: There is mild diffuse interstitial prominence similar to prior radiograph. No focal consolidation, pleural effusion, or pneumothorax. The cardiac silhouette is within normal limits. No acute osseous pathology. IMPRESSION: Probable mild congestion. Pneumonia is not excluded. Clinical correlation is recommended. Electronically Signed   By: Elgie Collard M.D.   On: 02/17/2018 01:24    Procedures Procedures   Medications Ordered in ED Medications  ipratropium-albuterol (DUONEB) 0.5-2.5 (3) MG/3ML nebulizer solution 3 mL (3 mLs Nebulization Given 02/17/18 0044)  albuterol (PROVENTIL) (2.5 MG/3ML) 0.083% nebulizer solution 5 mg (5 mg Nebulization Given 02/17/18 0341)  HYDROcodone-acetaminophen (NORCO/VICODIN) 5-325 MG per tablet 1 tablet (1 tablet Oral Given 02/17/18 0339)  albuterol (PROVENTIL,VENTOLIN) solution continuous neb (10 mg/hr Nebulization Given 02/17/18 0500)  doxycycline (VIBRA-TABS) tablet 100 mg (100 mg Oral Given 02/17/18 0454)  oxyCODONE-acetaminophen (PERCOCET/ROXICET) 5-325 MG per tablet 1 tablet (1 tablet Oral Given 02/17/18 0454)  predniSONE (DELTASONE) tablet 60 mg (60 mg Oral Given 02/17/18 0454)     Initial Impression / Assessment and Plan / ED Course  I have reviewed the triage vital signs and the nursing notes.  Pertinent labs & imaging results that were available during my care of the patient were reviewed by me and considered in my medical decision making (see chart for details).     1:25 AM Patient has  received albuterol by EMS.  Workup is pending at this time 3:57 AM Patient is a very difficult historian He called out for pain, but when I evaluated him he is fast asleep On reassessment, he is now awake.  He reports he is having pain all over, then he tells me he is having chest tightness. Due to his history of chest tightness, I offered nitroglycerin.  He then told me he is having pain all over and needs something else for pain.  He then  tells me that he needs albuterol treatment [facility] called [facility] lives, and they were unable to provide any further details except he supposed to wear oxygen at night he does not always wear that Repeat troponin and lipase are pending. 4:45 AM Troponin negative, lipase negative.  Patient overall appears improved. He reports he feels like he has fluid in his lungs.  On exam he does have wheezing and coarse breath sounds bilaterally. Due to history of COPD and x-ray findings, will place on steroids and add on doxycycline.  Patient needs to quit smoking  At this point, my suspicion for ACS/PE/dissection is low. I feel he is appropriate for discharge, will be managed with prednisone, doxycycline.  He reports he has albuterol at his facility. Also tells me he does not have a PCP, therefore he would need to follow-up close to his facility. He has a legal guardian, I placed a phone call in but no answer.  Patient keeps asking for pain medicines.  When I offered him a dose of Vicodin in the ER, he reports that does nothing for his pain and he needs Percocet. He then requested a prescription for pain medicines at discharge.  I advised need to follow-up as an outpatient for pain management.  Narcotic database reviewed and considered in decision making Final Clinical Impressions(s) / ED Diagnoses   Final diagnoses:  Chronic obstructive pulmonary disease with acute exacerbation (HCC)  Precordial pain    ED Discharge Orders        Ordered     predniSONE (DELTASONE) 50 MG tablet     02/17/18 0503    doxycycline (VIBRAMYCIN) 100 MG capsule  2 times daily     02/17/18 0503    albuterol (PROVENTIL HFA;VENTOLIN HFA) 108 (90 Base) MCG/ACT inhaler  Every 6 hours PRN     02/17/18 0503       Zadie Rhine, MD 02/17/18 [phone]

## 2018-02-18 IMAGING — US US EXTREM LOW VENOUS BILAT
1 series · 13 of 24 positions shown · non-contrast
Comparison: None.

CLINICAL DATA: Bilateral pedal edema and dyspnea.



[Series 1: us extrem low venous bilat · 0.07mm/px · 13 of 60 slices shown]
[im 1/60]
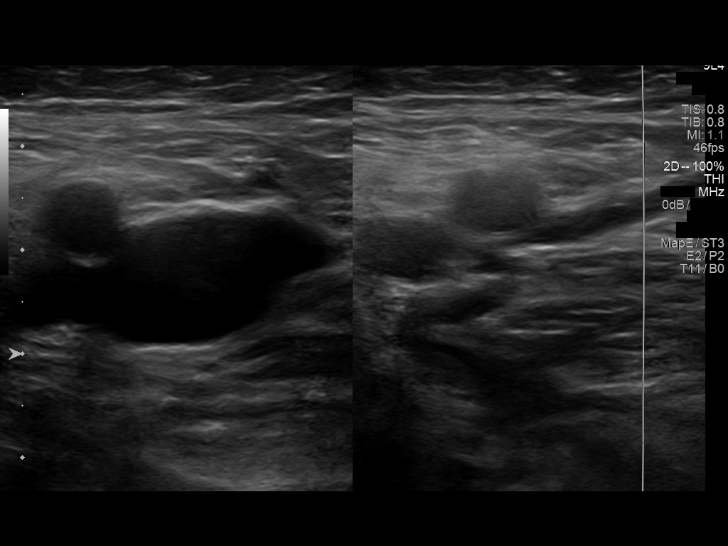
[im 6/60]
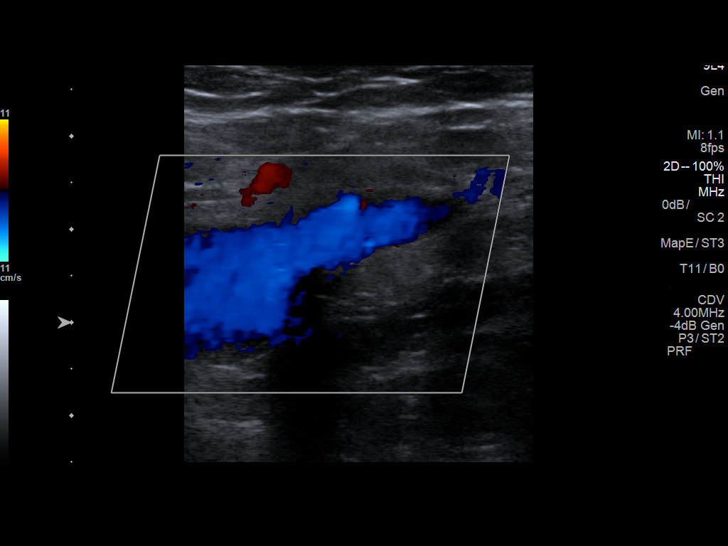
[im 11/60]
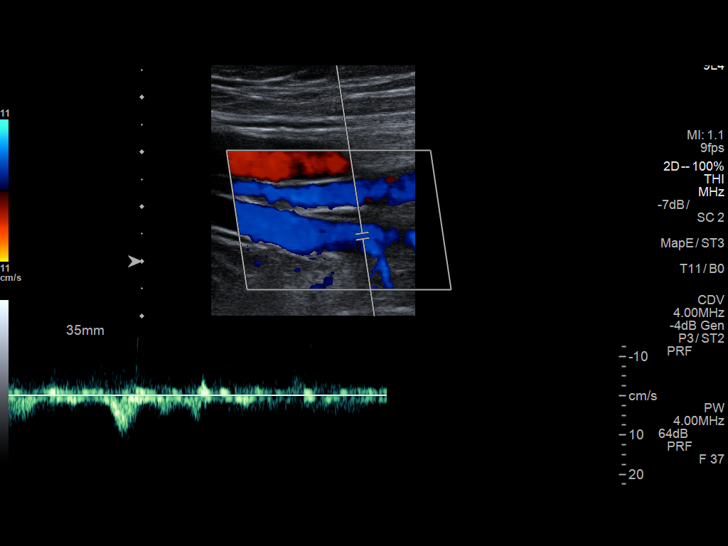
[im 16/60]
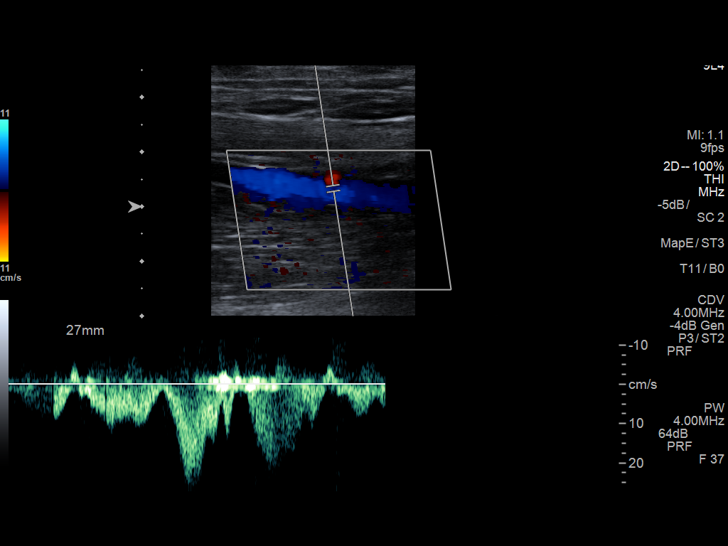
[im 21/60]
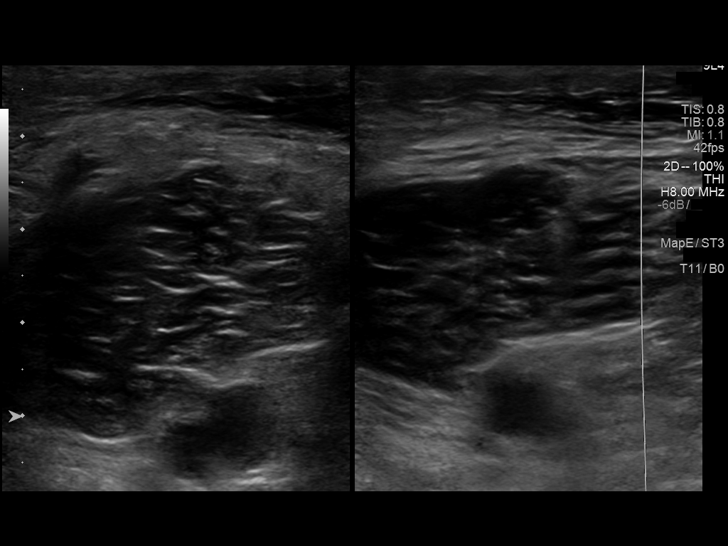
[im 26/60]
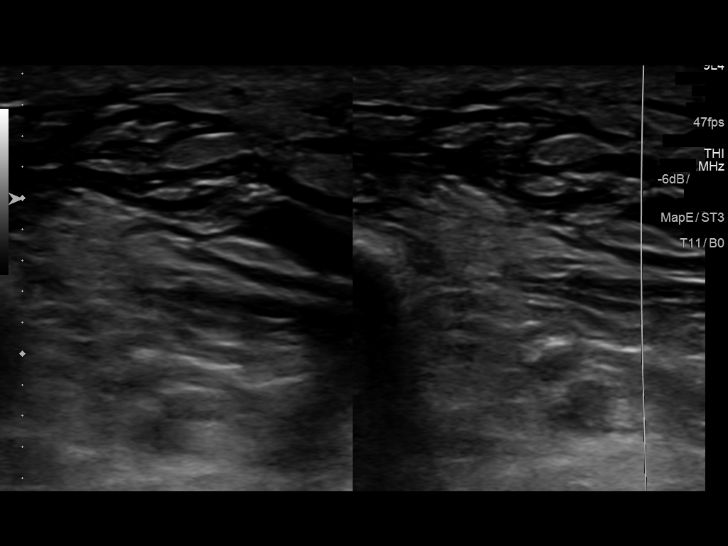
[im 31/60]
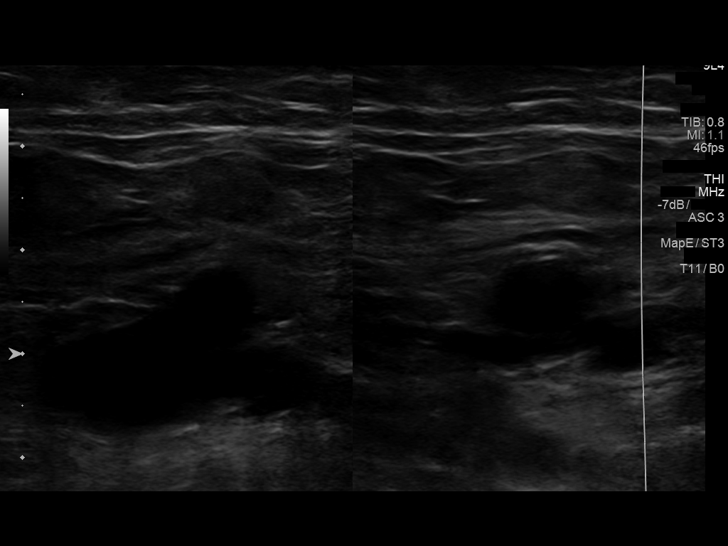
[im 34/60]
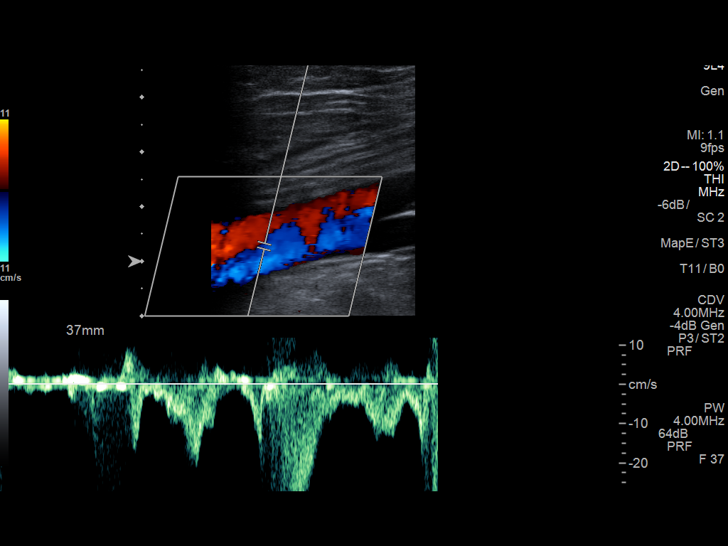
[im 39/60]
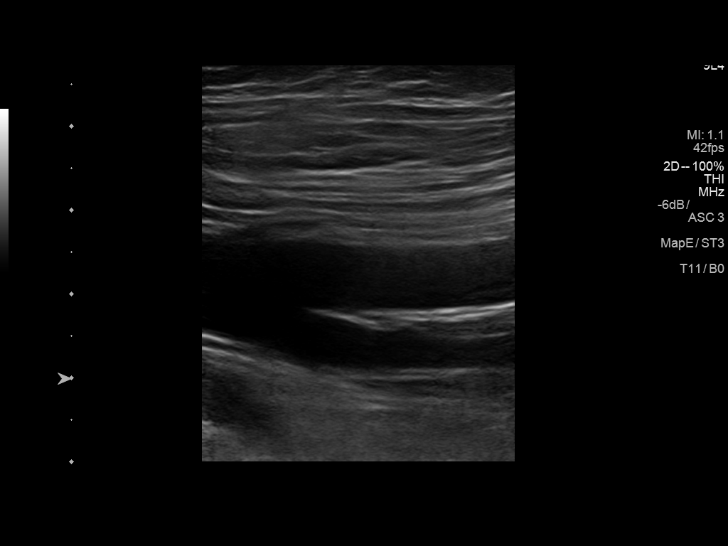
[im 44/60]
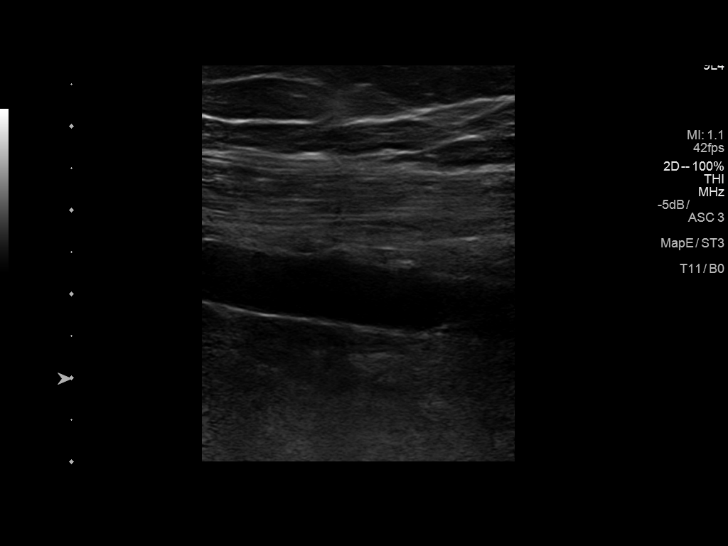
[im 49/60]
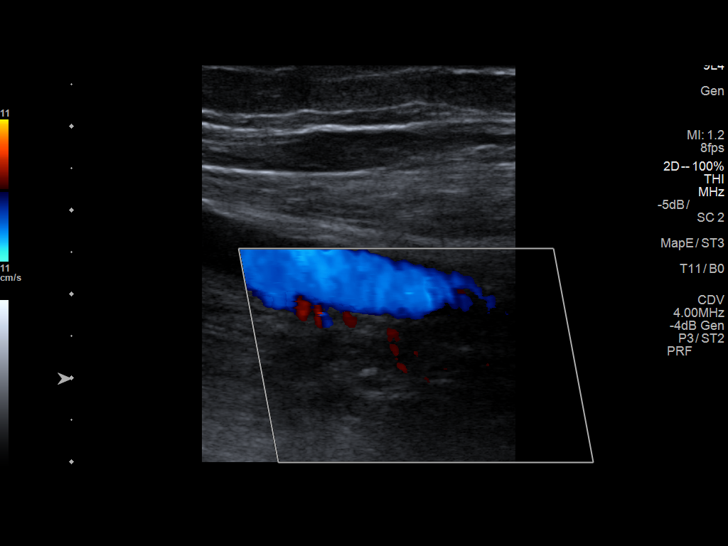
[im 54/60]
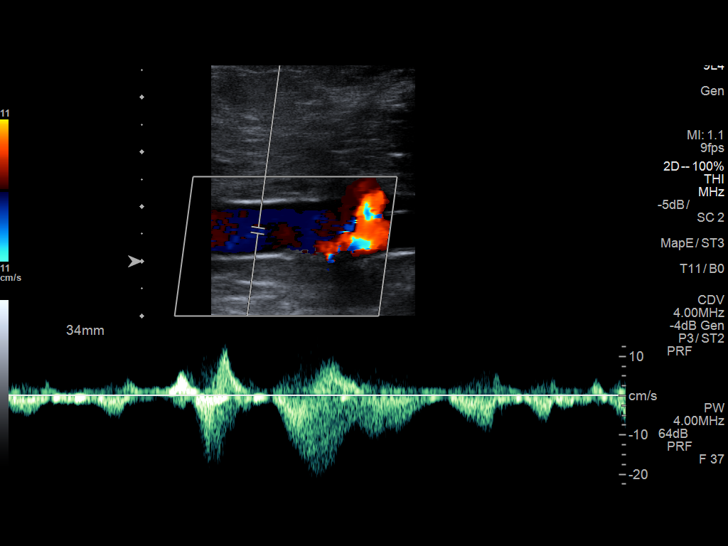
[im 60/60]
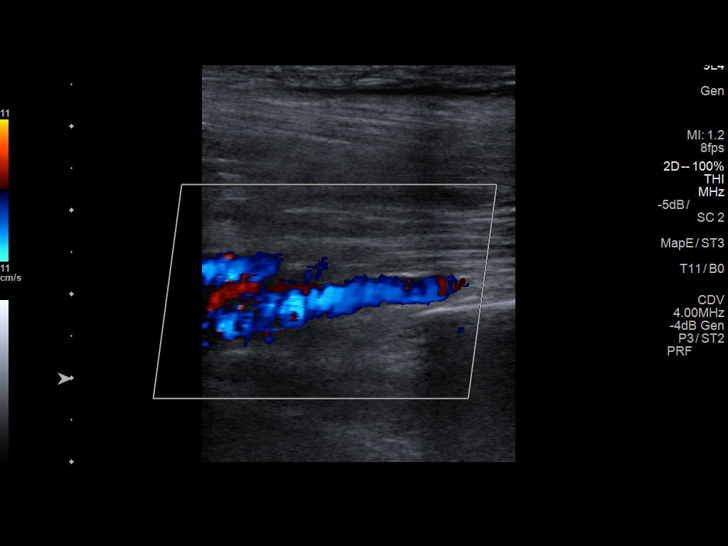

[13 of 24 positions shown; findings below may reference images not displayed]

FINDINGS: RIGHT LOWER EXTREMITY

Common Femoral Vein: No evidence of thrombus. Normal
compressibility, respiratory phasicity and response to augmentation.

Saphenofemoral Junction: No evidence of thrombus. Normal
compressibility and flow on color Doppler imaging.

Profunda Femoral Vein: No evidence of thrombus. Normal
compressibility and flow on color Doppler imaging.

Femoral Vein: No evidence of thrombus. Normal compressibility,
respiratory phasicity and response to augmentation.

Popliteal Vein: No evidence of thrombus. Normal compressibility,
respiratory phasicity and response to augmentation.

Calf Veins: No evidence of thrombus. Normal compressibility and flow
on color Doppler imaging.

Superficial Great Saphenous Vein: No evidence of thrombus. Normal
compressibility and flow on color Doppler imaging.

Venous Reflux:  None.

Other Findings:  None.

LEFT LOWER EXTREMITY

Common Femoral Vein: No evidence of thrombus. Normal
compressibility, respiratory phasicity and response to augmentation.

Saphenofemoral Junction: No evidence of thrombus. Normal
compressibility and flow on color Doppler imaging.

Profunda Femoral Vein: No evidence of thrombus. Normal
compressibility and flow on color Doppler imaging.

Femoral Vein: No evidence of thrombus. Normal compressibility,
respiratory phasicity and response to augmentation.

Popliteal Vein: No evidence of thrombus. Normal compressibility,
respiratory phasicity and response to augmentation.

Calf Veins: No evidence of thrombus. Normal compressibility and flow
on color Doppler imaging.

Superficial Great Saphenous Vein: No evidence of thrombus. Normal
compressibility and flow on color Doppler imaging.

Venous Reflux:  None.

Other Findings:  Subcutaneous edema is seen in the bilateral calves.
IMPRESSION: No evidence of DVT within either lower extremity.

Subcutaneous edema in the bilateral calves.

## 2018-02-24 IMAGING — CR DG CHEST 2V
2 series · 2 of 2 positions shown · non-contrast
Comparison: 01/13/2016

CLINICAL DATA: Cough and SOB for past few days. Recent admission to
hospital and treated for right empyema 01/06/2016.

EXAM:
CHEST  2 VIEW

[chest pa]
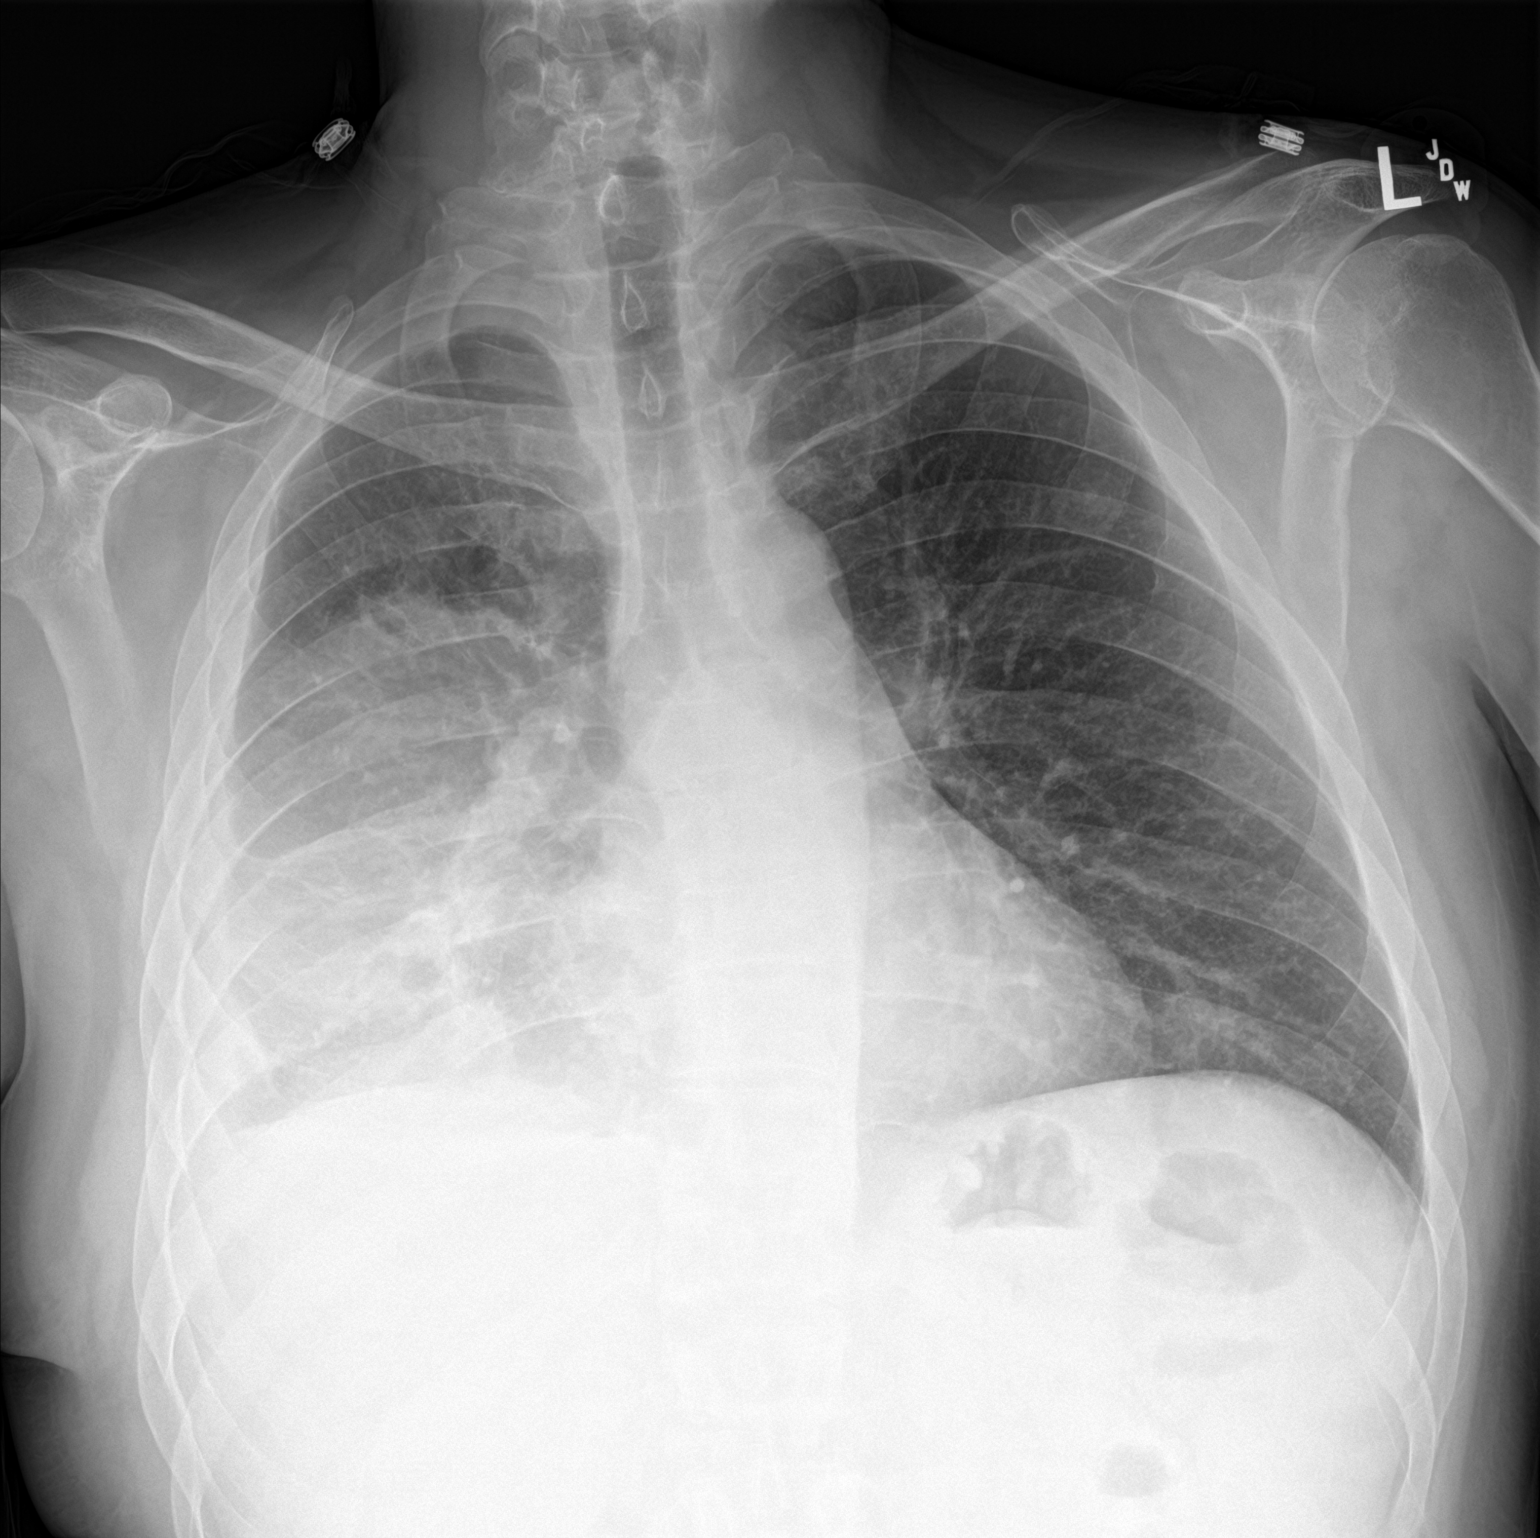

[chest lat]
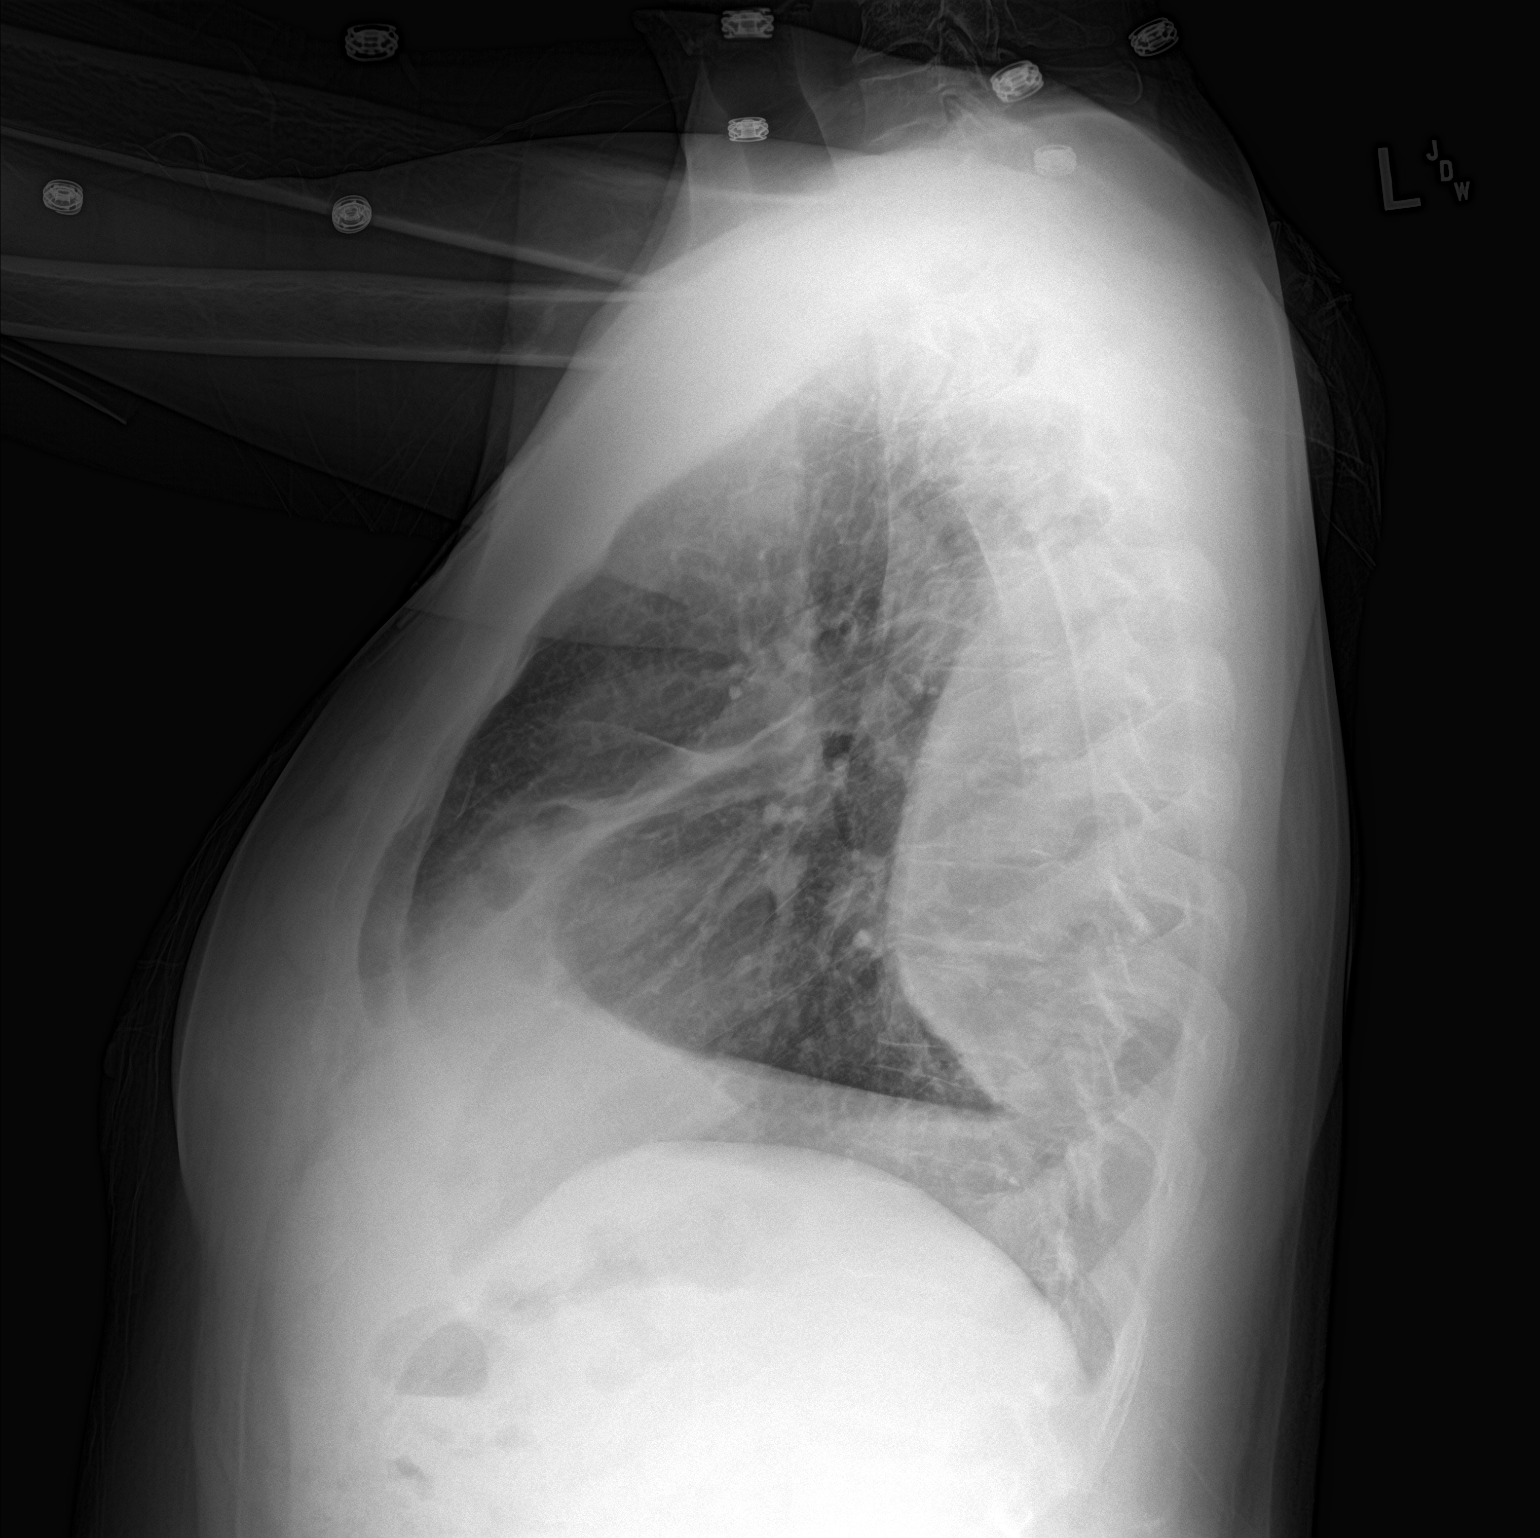

[2 of 2 positions shown; findings below may reference images not displayed]

FINDINGS: Loculated right posterior empyema appears slightly smaller than on
the prior study. Opacity in the adjacent right lower lobe and right
middle lobe appears improved on the lateral view.

There are no new areas of lung opacity. Left lung is essentially
clear. No left pleural effusion.

Cardiac silhouette is normal in size. No mediastinal or hilar
masses.
IMPRESSION: 1. No acute findings.
2. Right posterior empyema appears slightly smaller than on the
prior study. There is less opacity in the right lower lobe and right
middle lobe. No new abnormalities.

## 2018-02-24 IMAGING — CT CT ANGIO CHEST
2 of 9 series · 18 of 46 positions shown · IV contrast (APPLIED)
Comparison: Chest radiograph dated 01/20/2016 and CT dated
12/31/2015

CLINICAL DATA: 39-year-old male with right-sided chest pain. Recent
VATS procedure.

EXAM:
CT ANGIOGRAPHY CHEST WITH CONTRAST
TECHNIQUE: Multidetector CT imaging of the chest was performed using the
standard protocol during bolus administration of intravenous
contrast. Multiplanar CT image reconstructions and MIPs were
obtained to evaluate the vascular anatomy.
CONTRAST:  100mL OMNIPAQUE IOHEXOL 350 MG/ML SOLN

[Series 5: thins · axial · 0.72mm/px · z∈[+1269,+1522]mm · 15 of 285 slices shown]
[im 16/285  lung]
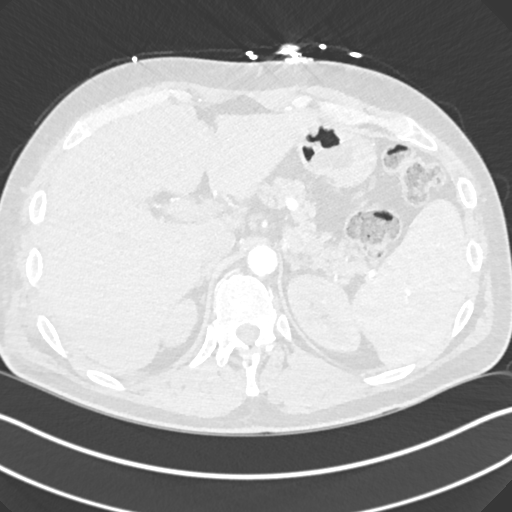
[im 32/285  soft-tissue]
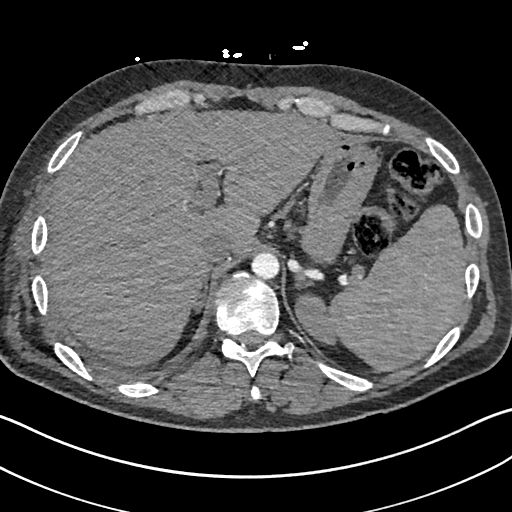
[im 48/285  lung]
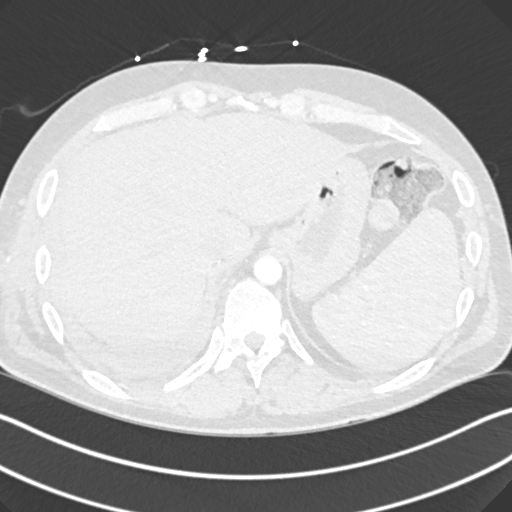
[im 64/285  soft-tissue]
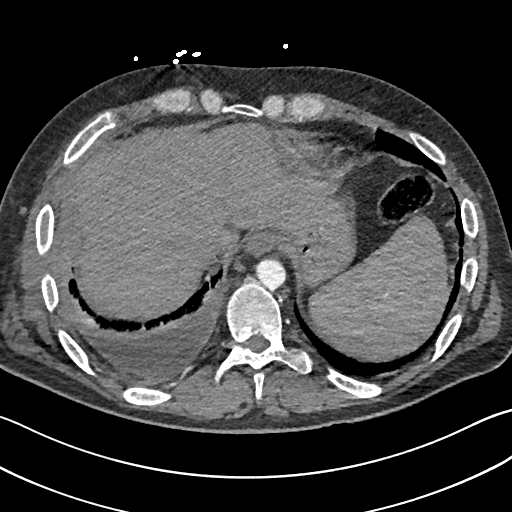
[im 95/285  lung]
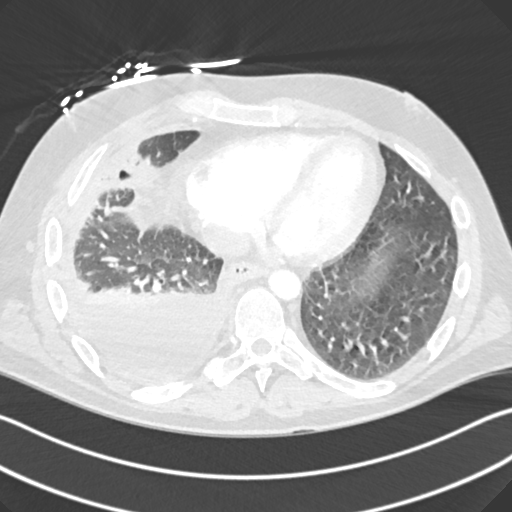
[im 111/285  soft-tissue]
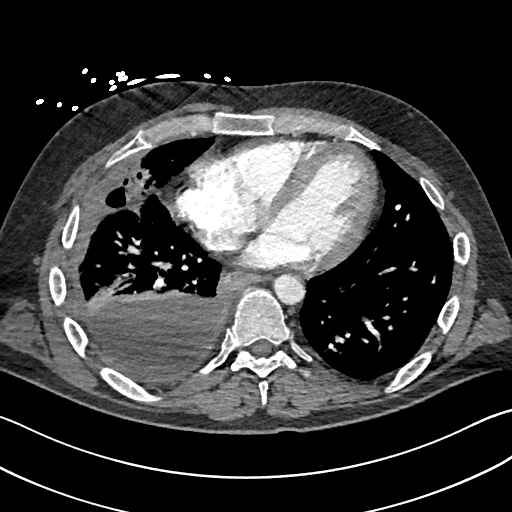
[im 127/285  lung]
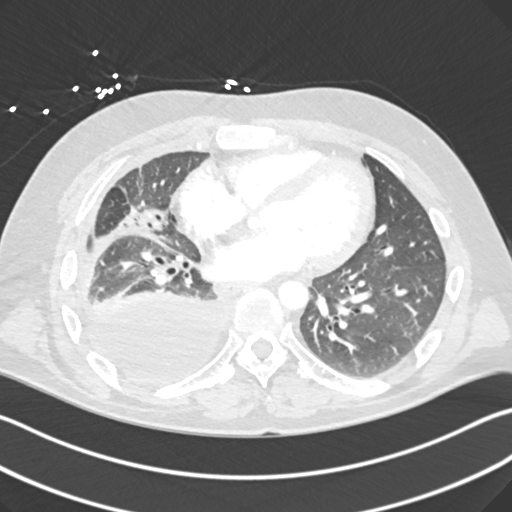
[im 143/285  soft-tissue]
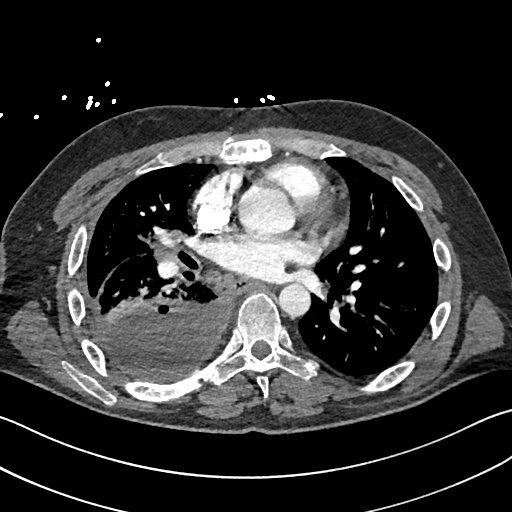
[im 158/285  lung]
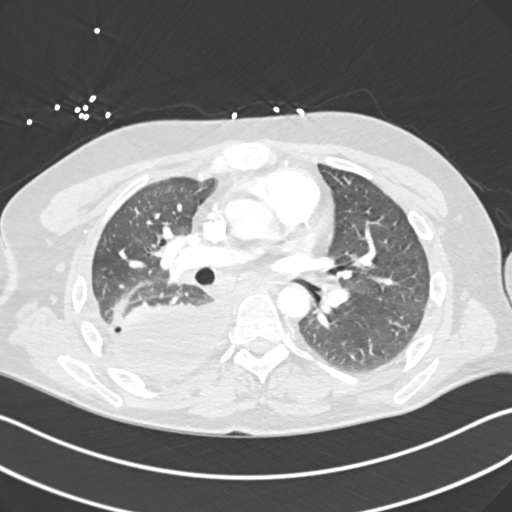
[im 174/285  soft-tissue]
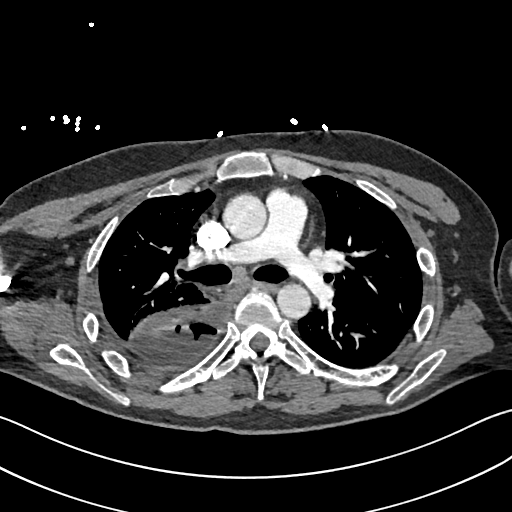
[im 190/285  lung]
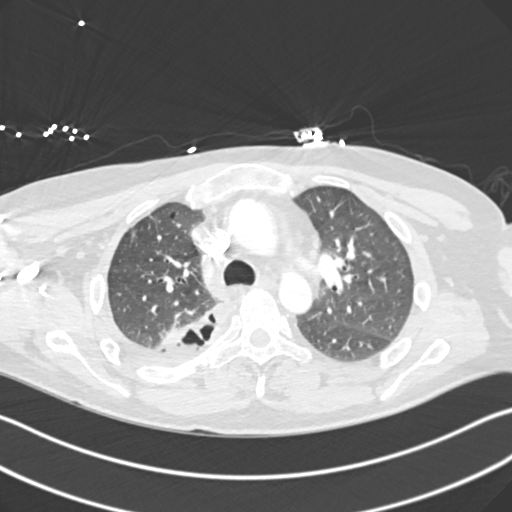
[im 221/285  soft-tissue]
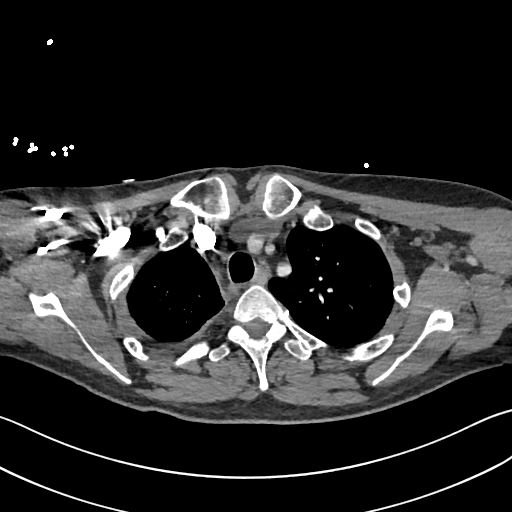
[im 237/285  lung]
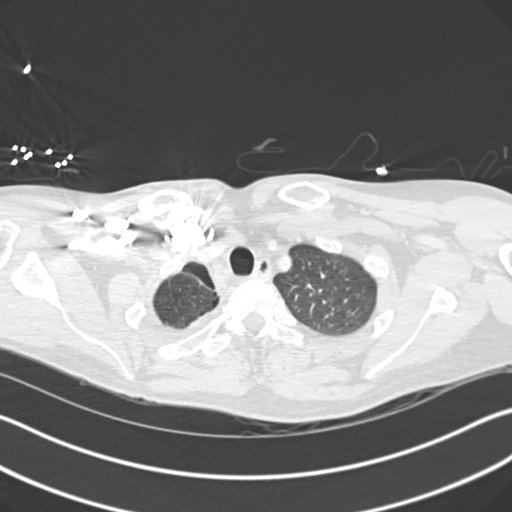
[im 253/285  soft-tissue]
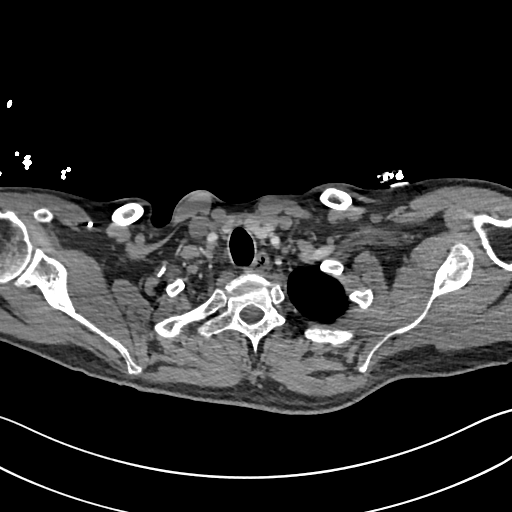
[im 269/285  lung]
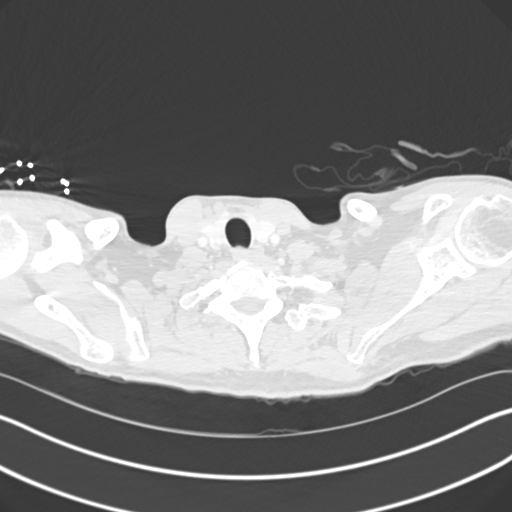

[Series 7: coronal mpr · coronal · 0.57mm/px · 3 of 128 slices shown]
[im 32/128  soft-tissue]
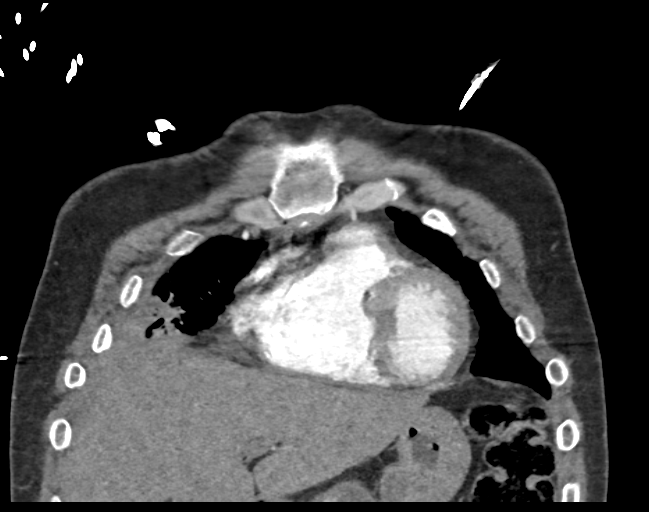
[im 64/128  soft-tissue]
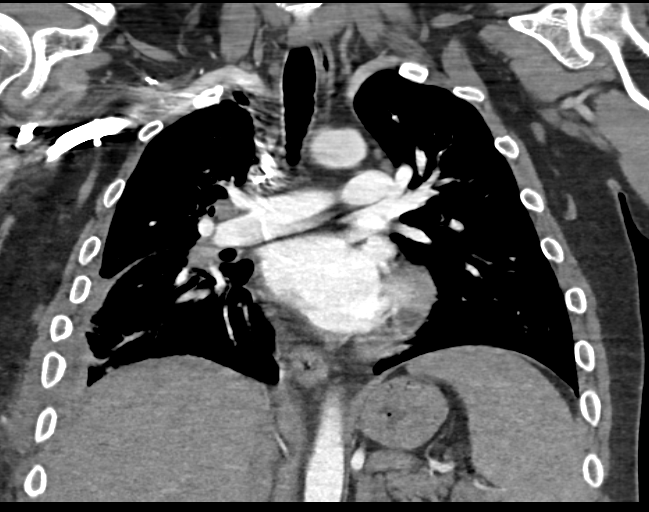
[im 96/128  soft-tissue]
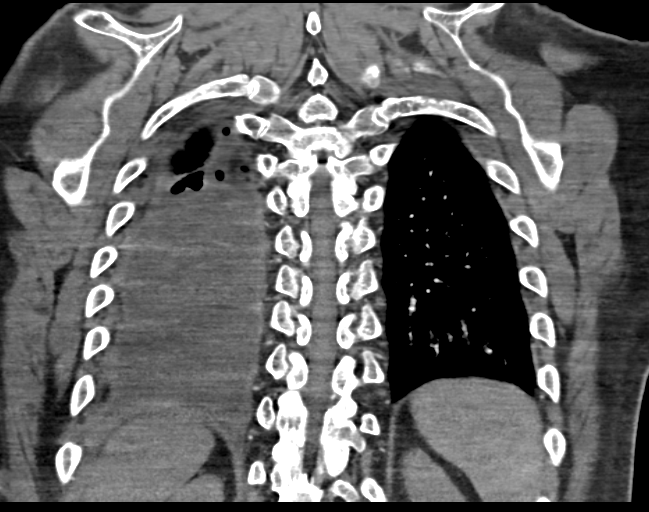

[18 of 46 positions shown; findings below may reference images not displayed]

FINDINGS: There is a 3.2 x 3.8 cm area of cavitation in the right middle lobe
as seen on the prior study. There is consolidative changes of the
adjacent lung with air bronchograms. There is a moderate size right
pleural effusion/empyema containing multiple pockets of air. Small
amount of loculated appearing air noted in the right apical pleural
space, likely postprocedural. The left lung is clear. The central
airways are patent.

The thoracic aorta appears unremarkable. No CT evidence of pulmonary
embolism. Top-normal cardiac size. No pericardial effusion. There is
coronary vascular calcification. Right hilar adenopathy as well as
subcarinal adenopathy noted. The esophagus is grossly unremarkable.

There is no axillary adenopathy. The chest wall soft tissues appear
unremarkable. The osseous structures are intact. The visualized
abdomen appears unremarkable.

Review of the MIP images confirms the above findings.
IMPRESSION: No CT evidence of pulmonary embolism.

Slight interval decrease in the size of the right middle lobe
cavitary lesion.

Moderate size right pleural effusion/empyema with multiple pockets
of air.

Small loculated appearing right apical pneumothorax.

These results were called by telephone at the time of interpretation
on 01/20/2016 at [DATE] to Dr. ACLECIO HAUBERT , who verbally
acknowledged these results.

## 2018-03-02 IMAGING — CR DG CHEST 2V
2 series · 2 of 2 positions shown · non-contrast
Comparison: None available in PACs

CLINICAL DATA: Right pleural effusion, history of be 8 CS and
decortication on [REDACTED]; persistent right lateral chest pain
since then.

EXAM:
CHEST  2 VIEW

[w chest pa]
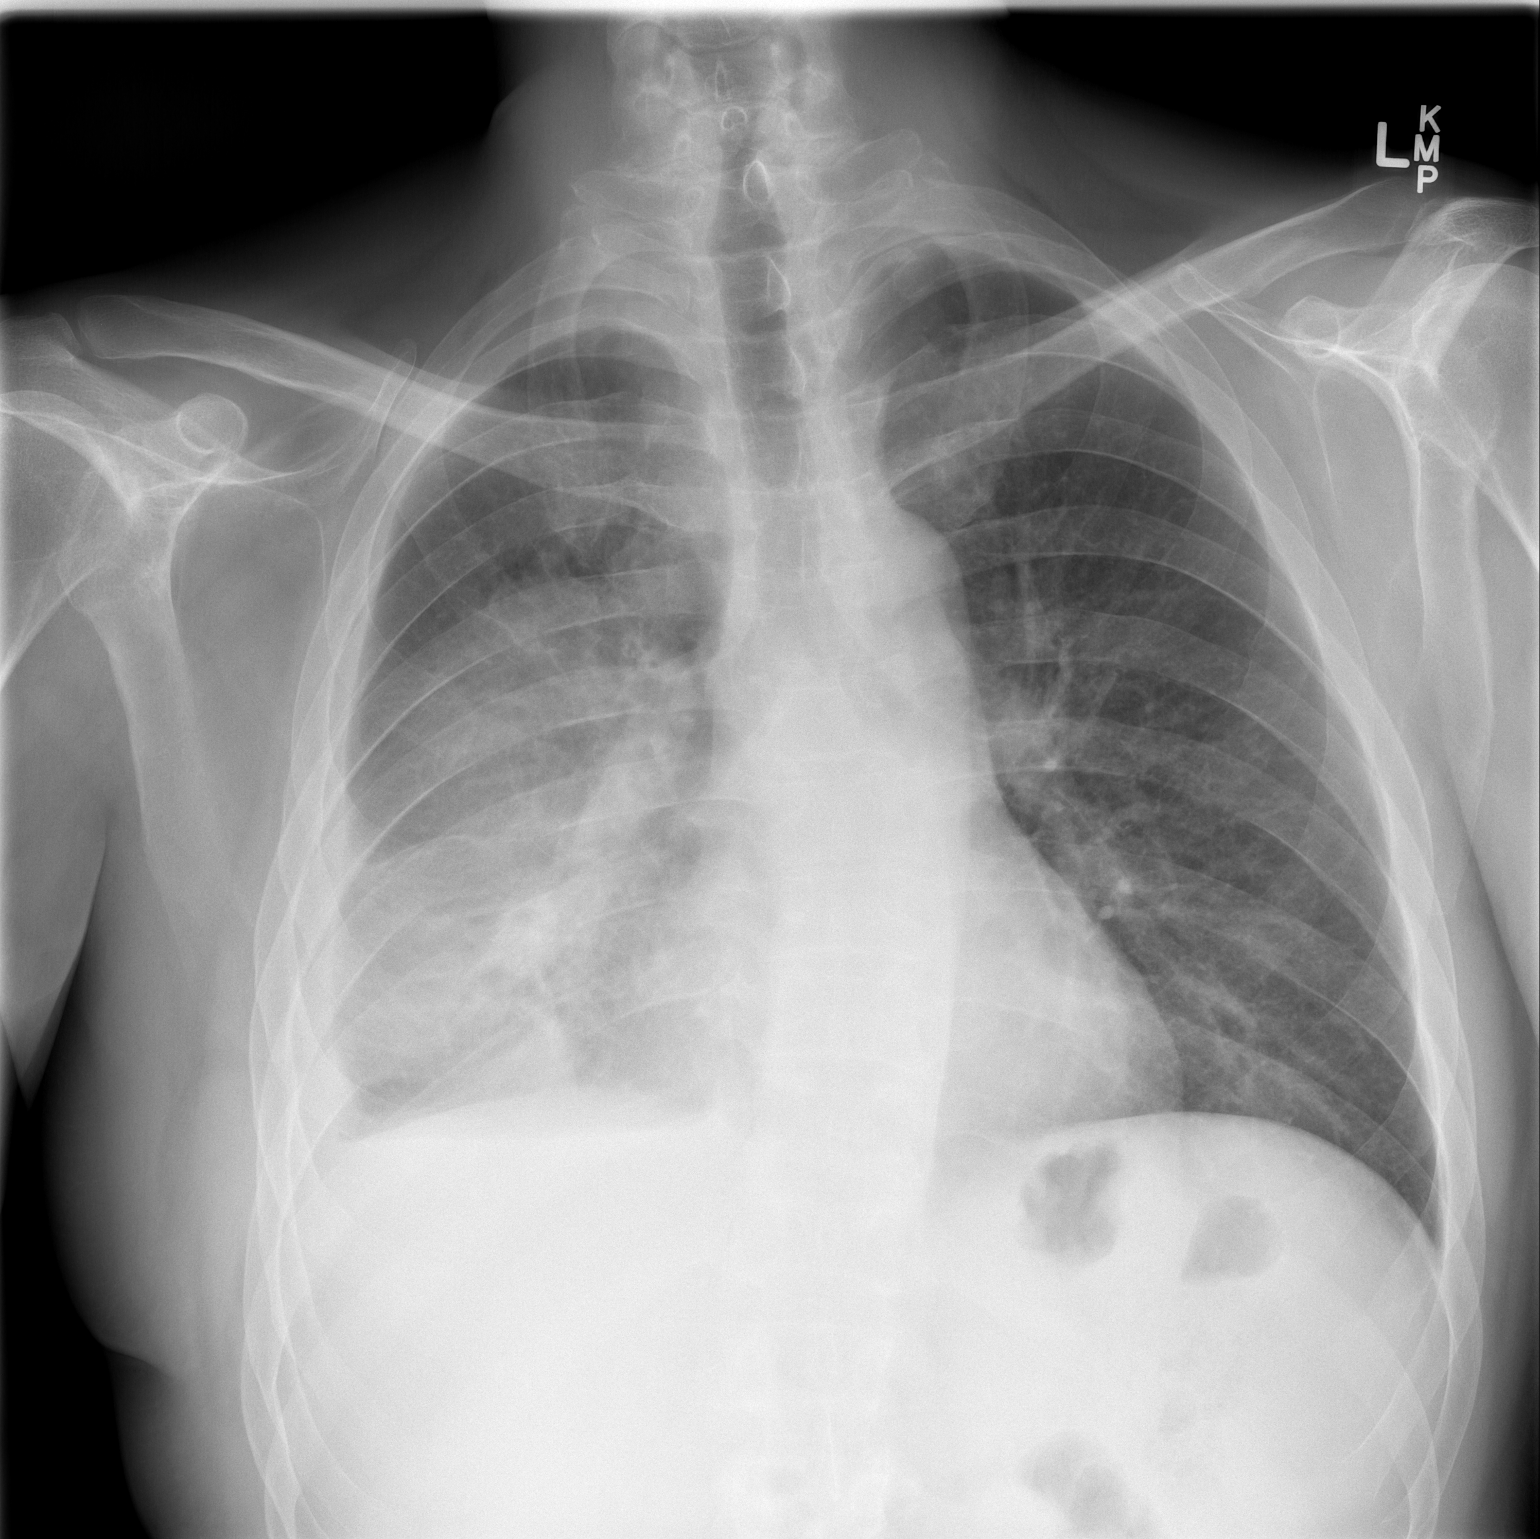

[w chest lat]
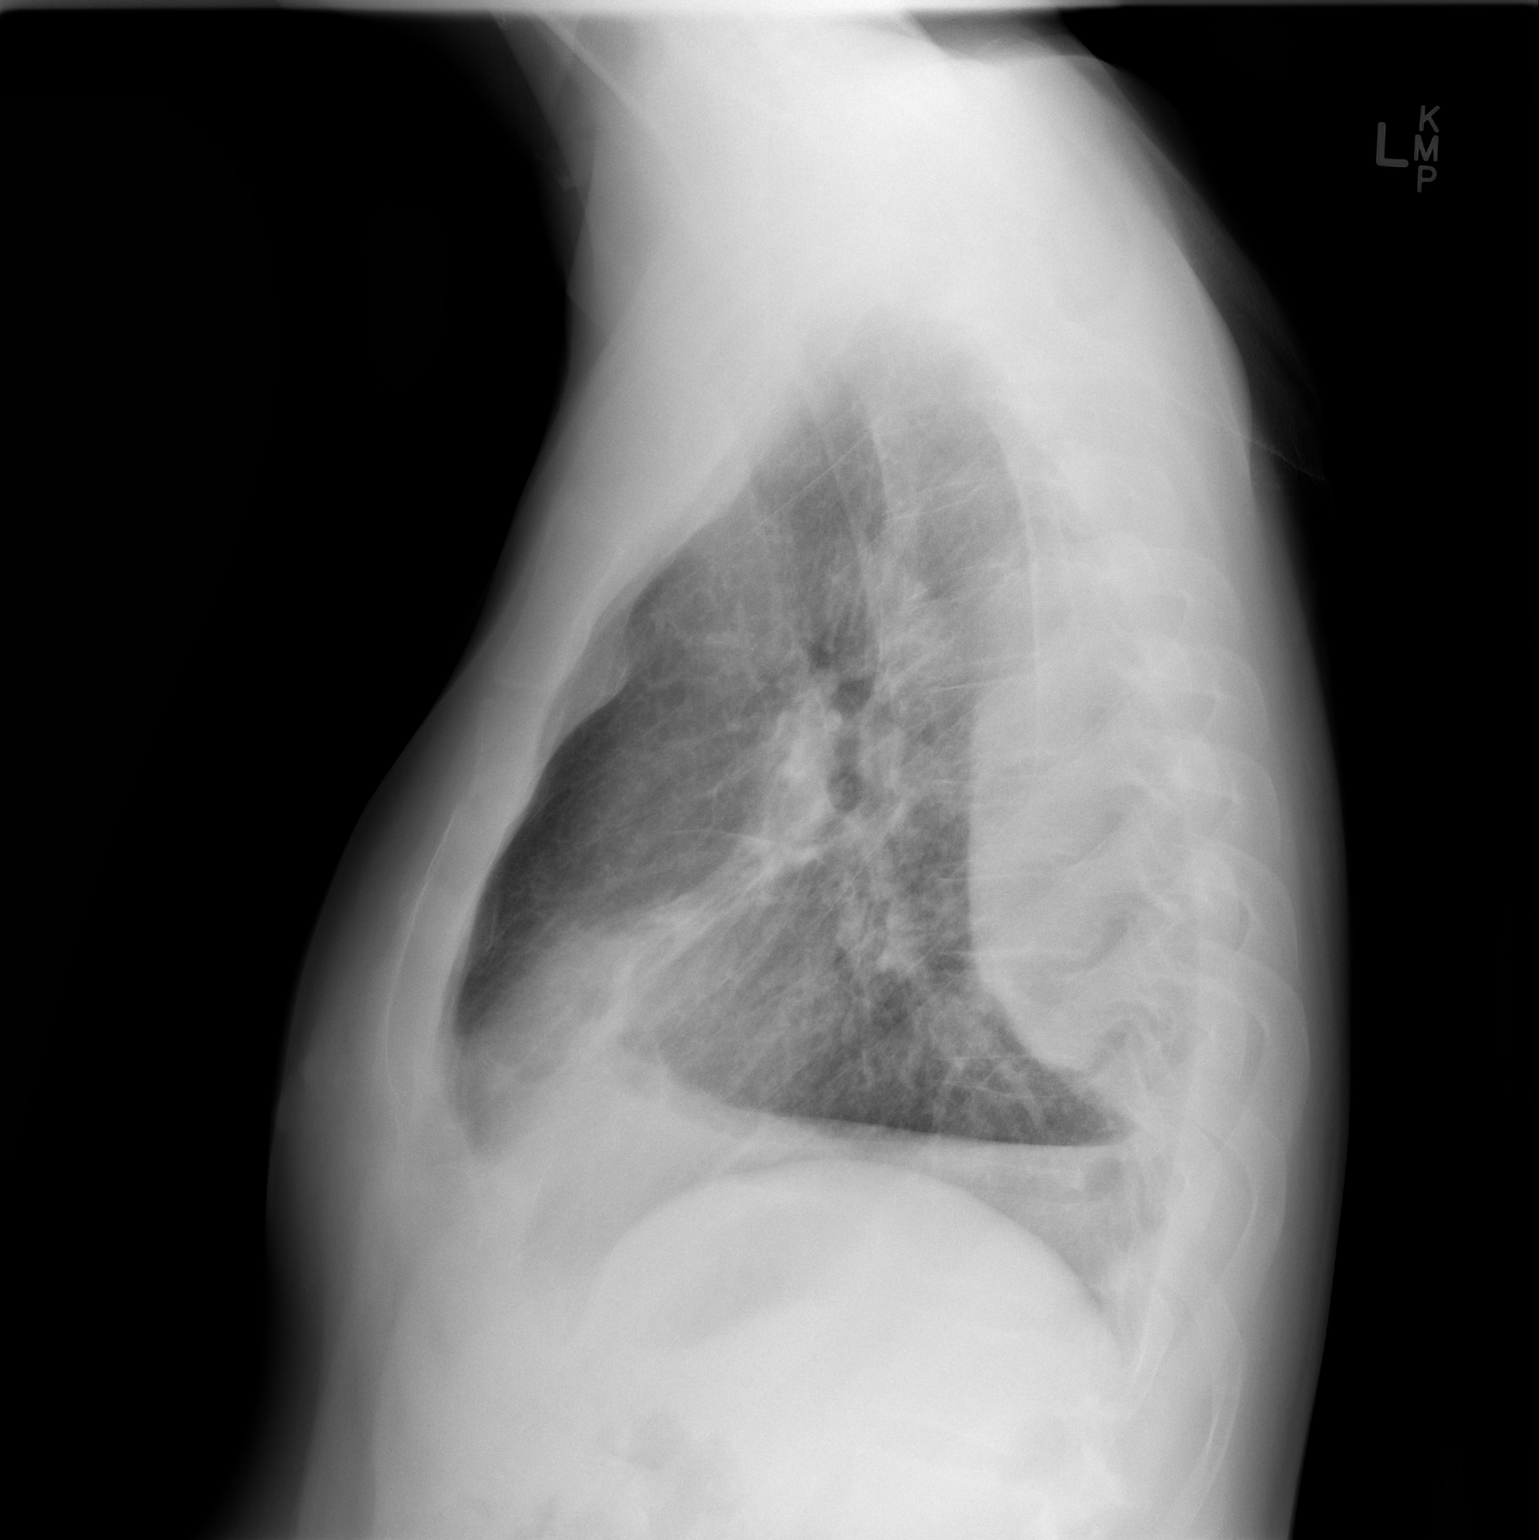

[2 of 2 positions shown; findings below may reference images not displayed]

FINDINGS: There is a moderate-sized pleural effusion on the right. Loculated
fluid versus pleural thickening is demonstrated posteriorly. There
is no definite pleural line visible but a meniscus type appearance
is noted in the lower right hemi thorax which suggests that a small
amount of pleural space air is present. There are coarse right
infrahilar lung markings in the middle lobe.

There is no mediastinal shift. The left lung is clear. The heart and
pulmonary vascularity are normal. The bony thorax exhibits no acute
abnormality.
IMPRESSION: Probable hydropneumothorax on the right given the meniscus type
appearance in the lower hemi thorax. The volume of right pleural
space air is felt to be small. There is loculated pleural fluid
posteriorly in the right hemithorax. No mediastinal shift is
observed.

Chest CT scanning is recommended.

## 2018-03-07 ENCOUNTER — Other Ambulatory Visit: Payer: Self-pay

## 2018-03-07 ENCOUNTER — Encounter (HOSPITAL_COMMUNITY): Payer: Self-pay | Admitting: Emergency Medicine

## 2018-03-07 ENCOUNTER — Emergency Department (HOSPITAL_COMMUNITY): Payer: Medicare Other

## 2018-03-07 ENCOUNTER — Observation Stay (HOSPITAL_COMMUNITY)
Admission: EM | Admit: 2018-03-07 | Discharge: 2018-03-08 | Disposition: A | Discharge status: Home / Self Care | Payer: Medicare Other | Attending: Internal Medicine | Admitting: Internal Medicine

## 2018-03-07 DIAGNOSIS — Z79899 Other long term (current) drug therapy: Secondary | ICD-10-CM | POA: Diagnosis not present

## 2018-03-07 DIAGNOSIS — K7460 Unspecified cirrhosis of liver: Secondary | ICD-10-CM | POA: Diagnosis not present

## 2018-03-07 DIAGNOSIS — J45909 Unspecified asthma, uncomplicated: Secondary | ICD-10-CM | POA: Diagnosis not present

## 2018-03-07 DIAGNOSIS — L03116 Cellulitis of left lower limb: Principal | ICD-10-CM

## 2018-03-07 DIAGNOSIS — F603 Borderline personality disorder: Secondary | ICD-10-CM | POA: Diagnosis present

## 2018-03-07 DIAGNOSIS — G40909 Epilepsy, unspecified, not intractable, without status epilepticus: Secondary | ICD-10-CM

## 2018-03-07 DIAGNOSIS — F418 Other specified anxiety disorders: Secondary | ICD-10-CM | POA: Insufficient documentation

## 2018-03-07 DIAGNOSIS — E871 Hypo-osmolality and hyponatremia: Secondary | ICD-10-CM | POA: Diagnosis present

## 2018-03-07 DIAGNOSIS — F17210 Nicotine dependence, cigarettes, uncomplicated: Secondary | ICD-10-CM | POA: Insufficient documentation

## 2018-03-07 DIAGNOSIS — K219 Gastro-esophageal reflux disease without esophagitis: Secondary | ICD-10-CM | POA: Diagnosis present

## 2018-03-07 DIAGNOSIS — I10 Essential (primary) hypertension: Secondary | ICD-10-CM | POA: Diagnosis not present

## 2018-03-07 DIAGNOSIS — F1020 Alcohol dependence, uncomplicated: Secondary | ICD-10-CM | POA: Diagnosis not present

## 2018-03-07 DIAGNOSIS — F332 Major depressive disorder, recurrent severe without psychotic features: Secondary | ICD-10-CM | POA: Diagnosis present

## 2018-03-07 DIAGNOSIS — R569 Unspecified convulsions: Secondary | ICD-10-CM | POA: Insufficient documentation

## 2018-03-07 DIAGNOSIS — Z720 Tobacco use: Secondary | ICD-10-CM | POA: Diagnosis present

## 2018-03-07 DIAGNOSIS — J449 Chronic obstructive pulmonary disease, unspecified: Secondary | ICD-10-CM | POA: Diagnosis present

## 2018-03-07 DIAGNOSIS — G894 Chronic pain syndrome: Secondary | ICD-10-CM | POA: Diagnosis not present

## 2018-03-07 DIAGNOSIS — M25572 Pain in left ankle and joints of left foot: Secondary | ICD-10-CM | POA: Diagnosis present

## 2018-03-07 LAB — CBC WITH DIFFERENTIAL/PLATELET
BASOS PCT: 0 %
Basophils Absolute: 0 10*3/uL (ref 0.0–0.1)
EOS ABS: 0.1 10*3/uL (ref 0.0–0.7)
EOS PCT: 1 %
HCT: 44.4 % (ref 39.0–52.0)
Hemoglobin: 15.4 g/dL (ref 13.0–17.0)
LYMPHS ABS: 1.6 10*3/uL (ref 0.7–4.0)
Lymphocytes Relative: 16 %
MCH: 32.5 pg (ref 26.0–34.0)
MCHC: 34.7 g/dL (ref 30.0–36.0)
MCV: 93.7 fL (ref 78.0–100.0)
Monocytes Absolute: 1.1 10*3/uL — ABNORMAL HIGH (ref 0.1–1.0)
Monocytes Relative: 10 %
Neutro Abs: 7.5 10*3/uL (ref 1.7–7.7)
Neutrophils Relative %: 73 %
PLATELETS: 205 10*3/uL (ref 150–400)
RBC: 4.74 MIL/uL (ref 4.22–5.81)
RDW: 12.8 % (ref 11.5–15.5)
WBC: 10.3 10*3/uL (ref 4.0–10.5)

## 2018-03-07 LAB — BASIC METABOLIC PANEL
Anion gap: 13 (ref 5–15)
BUN: 10 mg/dL (ref 6–20)
CALCIUM: 9.4 mg/dL (ref 8.9–10.3)
CO2: 26 mmol/L (ref 22–32)
CREATININE: 0.81 mg/dL (ref 0.61–1.24)
Chloride: 94 mmol/L — ABNORMAL LOW (ref 101–111)
GFR calc Af Amer: >60 mL/min (ref >60)
Glucose, Bld: 83 mg/dL (ref 65–99)
Potassium: 3.5 mmol/L (ref 3.5–5.1)
SODIUM: 133 mmol/L — AB (ref 135–145)

## 2018-03-07 LAB — LACTIC ACID, PLASMA: LACTIC ACID, VENOUS: 0.9 mmol/L (ref 0.5–1.9)

## 2018-03-07 MED ORDER — QUETIAPINE FUMARATE 100 MG PO TABS
200.0000 mg | ORAL_TABLET | Freq: Every day | ORAL | Status: DC
  Administered 2018-03-07: 200 mg via ORAL
  Filled 2018-03-07: qty 2

## 2018-03-07 MED ORDER — ENOXAPARIN SODIUM 40 MG/0.4ML ~~LOC~~ SOLN
40.0000 mg | SUBCUTANEOUS | Status: DC
  Administered 2018-03-07: 40 mg via SUBCUTANEOUS
  Filled 2018-03-07: qty 0.4

## 2018-03-07 MED ORDER — SODIUM CHLORIDE 0.9 % IV SOLN
1250.0000 mg | Freq: Two times a day (BID) | INTRAVENOUS | Status: DC
  Filled 2018-03-07 (×2): qty 1250

## 2018-03-07 MED ORDER — CYCLOBENZAPRINE HCL 10 MG PO TABS: 10.0000 mg | ORAL_TABLET | Freq: Three times a day (TID) | ORAL | Status: DC | PRN

## 2018-03-07 MED ORDER — FERROUS SULFATE 325 (65 FE) MG PO TABS
325.0000 mg | ORAL_TABLET | Freq: Two times a day (BID) | ORAL | Status: DC
  Administered 2018-03-07 – 2018-03-08 (×3): 325 mg via ORAL
  Filled 2018-03-07 (×3): qty 1

## 2018-03-07 MED ORDER — ESCITALOPRAM OXALATE 10 MG PO TABS
20.0000 mg | ORAL_TABLET | ORAL | Status: DC
  Administered 2018-03-08: 20 mg via ORAL
  Filled 2018-03-07: qty 2

## 2018-03-07 MED ORDER — HYDROXYZINE HCL 25 MG PO TABS
50.0000 mg | ORAL_TABLET | Freq: Three times a day (TID) | ORAL | Status: DC
  Administered 2018-03-07 – 2018-03-08 (×4): 50 mg via ORAL
  Filled 2018-03-07 (×4): qty 2

## 2018-03-07 MED ORDER — ONDANSETRON HCL 4 MG PO TABS: 4.0000 mg | ORAL_TABLET | Freq: Four times a day (QID) | ORAL | Status: DC | PRN

## 2018-03-07 MED ORDER — ALBUTEROL SULFATE (2.5 MG/3ML) 0.083% IN NEBU: 2.5000 mg | INHALATION_SOLUTION | Freq: Four times a day (QID) | RESPIRATORY_TRACT | Status: DC | PRN

## 2018-03-07 MED ORDER — PRIMIDONE 50 MG PO TABS
300.0000 mg | ORAL_TABLET | Freq: Three times a day (TID) | ORAL | Status: DC
  Administered 2018-03-07 – 2018-03-08 (×2): 300 mg via ORAL

## 2018-03-07 MED ORDER — MOMETASONE FURO-FORMOTEROL FUM 200-5 MCG/ACT IN AERO
2.0000 | INHALATION_SPRAY | Freq: Two times a day (BID) | RESPIRATORY_TRACT | Status: DC
  Administered 2018-03-07 – 2018-03-08 (×2): 2 via RESPIRATORY_TRACT
  Filled 2018-03-07: qty 8.8

## 2018-03-07 MED ORDER — MELATONIN 5 MG PO TABS: 10.0000 mg | ORAL_TABLET | Freq: Every day | ORAL | Status: DC

## 2018-03-07 MED ORDER — VITAMIN D 1000 UNITS PO TABS
2000.0000 [IU] | ORAL_TABLET | Freq: Every day | ORAL | Status: DC
  Administered 2018-03-08: 2000 [IU] via ORAL
  Filled 2018-03-07 (×2): qty 2

## 2018-03-07 MED ORDER — VANCOMYCIN HCL 10 G IV SOLR: 1250.0000 mg | INTRAVENOUS | Status: DC

## 2018-03-07 MED ORDER — KETOROLAC TROMETHAMINE 30 MG/ML IJ SOLN
30.0000 mg | Freq: Four times a day (QID) | INTRAMUSCULAR | Status: DC | PRN
  Administered 2018-03-07: 30 mg via INTRAVENOUS
  Filled 2018-03-07: qty 1

## 2018-03-07 MED ORDER — PRIMIDONE 50 MG PO TABS: 250.0000 mg | ORAL_TABLET | Freq: Three times a day (TID) | ORAL | Status: DC

## 2018-03-07 MED ORDER — GABAPENTIN 300 MG PO CAPS
300.0000 mg | ORAL_CAPSULE | Freq: Three times a day (TID) | ORAL | Status: DC
  Administered 2018-03-07 – 2018-03-08 (×4): 300 mg via ORAL
  Filled 2018-03-07 (×4): qty 1

## 2018-03-07 MED ORDER — PANTOPRAZOLE SODIUM 40 MG PO TBEC
40.0000 mg | DELAYED_RELEASE_TABLET | Freq: Every day | ORAL | Status: DC
  Administered 2018-03-08: 40 mg via ORAL
  Filled 2018-03-07: qty 1

## 2018-03-07 MED ORDER — OXYCODONE-ACETAMINOPHEN 5-325 MG PO TABS
1.0000 | ORAL_TABLET | Freq: Four times a day (QID) | ORAL | Status: DC | PRN
  Administered 2018-03-07 – 2018-03-08 (×3): 1 via ORAL
  Filled 2018-03-07 (×3): qty 1

## 2018-03-07 MED ORDER — DIVALPROEX SODIUM ER 500 MG PO TB24
2000.0000 mg | ORAL_TABLET | Freq: Every day | ORAL | Status: DC
  Administered 2018-03-07: 2000 mg via ORAL
  Filled 2018-03-07: qty 4

## 2018-03-07 MED ORDER — ALBUTEROL SULFATE HFA 108 (90 BASE) MCG/ACT IN AERS: 1.0000 | INHALATION_SPRAY | Freq: Four times a day (QID) | RESPIRATORY_TRACT | Status: DC | PRN

## 2018-03-07 MED ORDER — ONDANSETRON HCL 4 MG/2ML IJ SOLN: 4.0000 mg | Freq: Four times a day (QID) | INTRAMUSCULAR | Status: DC | PRN

## 2018-03-07 MED ORDER — LACTULOSE 10 GM/15ML PO SOLN
30.0000 g | Freq: Two times a day (BID) | ORAL | Status: DC
  Administered 2018-03-07 – 2018-03-08 (×2): 30 g via ORAL
  Filled 2018-03-07 (×2): qty 60

## 2018-03-07 MED ORDER — OXYCODONE-ACETAMINOPHEN 5-325 MG PO TABS
1.0000 | ORAL_TABLET | Freq: Once | ORAL | Status: AC
  Administered 2018-03-07: 1 via ORAL
  Filled 2018-03-07: qty 1

## 2018-03-07 MED ORDER — VANCOMYCIN HCL IN DEXTROSE 1-5 GM/200ML-% IV SOLN
1000.0000 mg | Freq: Once | INTRAVENOUS | Status: AC
  Administered 2018-03-07: 1000 mg via INTRAVENOUS
  Filled 2018-03-07: qty 200

## 2018-03-07 MED ORDER — OXYCODONE-ACETAMINOPHEN 5-325 MG PO TABS
1.0000 | ORAL_TABLET | Freq: Once | ORAL | Status: AC
Start: 2018-03-07 — End: 2018-03-07
  Administered 2018-03-07: 1 via ORAL
  Filled 2018-03-07: qty 1

## 2018-03-07 MED ORDER — DISULFIRAM 250 MG PO TABS: 250.0000 mg | ORAL_TABLET | Freq: Every day | ORAL | Status: DC

## 2018-03-07 MED ORDER — ENSURE ENLIVE PO LIQD
237.0000 mL | Freq: Two times a day (BID) | ORAL | Status: DC
  Administered 2018-03-08 (×2): 237 mL via ORAL

## 2018-03-07 NOTE — H&P (Signed)
History and Physical    [name] ZOX:[id number] DOB: 07-31-1976 DOA: 03/07/2018  PCP: Patient, No Pcp Per   Patient coming from: Assisted living facility   Chief Complaint: left foot cellulitis  HPI: [name] is a 42 y.o. male with medical history significant of past medical history relevant for multiple psychiatric disorders, seizure disorder, alcohol abuse in remission, chronic hyponatremia, COPD on oxygen only with exertion who comes in with left lower extremity cellulitis.  Patient reports that this morning he woke up with spreading erythema and swelling over his left ankle extended proximally.  He denies any fevers however reports his temperature was not checked.  He did report chills.  He denies any nausea, vomiting, diarrhea.  He denies any cough, congestion, chest pain, lower extremity edema, joint pain that is new.  ED Course: In the ED patient's vitals were unremarkable.  His labs were completely reassuring except for baseline mild hyponatremia.  He received 1 dose of IV vancomycin and was admitted for cellulitis.  Review of Systems: As per HPI otherwise 10 point review of systems negative.    Past Medical History:  Diagnosis Date  . Anxiety   . Asthma   . CHF (congestive heart failure) (HCC)   . Depression   . Encephalopathy   . GERD (gastroesophageal reflux disease)   . Hypertension   . Hyponatremia 06/2016  . Renal disorder    kidney injury  . Seizures (HCC)     Past Surgical History:  Procedure Laterality Date  . FINGER SURGERY Left    5th digit  . PLEURAL EFFUSION DRAINAGE Right 01/02/2016   Procedure: DRAINAGE OF PLEURAL EFFUSION;  Surgeon: Loreli Slot, MD;  Location: [facility] OR;  Service: Thoracic;  Laterality: Right;  Marland Kitchen VIDEO ASSISTED THORACOSCOPY (VATS)/DECORTICATION Right 01/02/2016   Procedure: VIDEO ASSISTED THORACOSCOPY (VATS)/DECORTICATION;  Surgeon: Loreli Slot, MD;  Location: [facility] OR;  Service: Thoracic;  Laterality: Right;  Marland Kitchen VIDEO  BRONCHOSCOPY N/A 01/02/2016   Procedure: VIDEO BRONCHOSCOPY;  Surgeon: Loreli Slot, MD;  Location: [facility] OR;  Service: Thoracic;  Laterality: N/A;     reports that he has been smoking cigarettes.  He has a 10.00 pack-year smoking history. He has never used smokeless tobacco. He reports that he does not drink alcohol or use drugs.  Allergies  Allergen Reactions  . Penicillins Nausea And Vomiting    Has patient had a PCN reaction causing immediate rash, facial/tongue/throat swelling, SOB or lightheadedness with hypotension:No Has patient had a PCN reaction causing severe rash involving mucus membranes or skin necrosis:No Has patient had a PCN reaction that required hospitalization:No Has patient had a PCN reaction occurring within the last 10 years:NO If all of the above answers are "NO", then may proceed with Cephalosporin use.      Family History  Problem Relation Age of Onset  . CAD Mother   . Diabetes Mellitus II Father   . Prostate cancer Paternal Grandfather    Unacceptable: Noncontributory, unremarkable, or negative. Acceptable: Family history reviewed and not pertinent (If you reviewed it)  Prior to Admission medications   Medication Sig Start Date End Date Taking? Authorizing Provider  albuterol (PROVENTIL HFA;VENTOLIN HFA) 108 (90 Base) MCG/ACT inhaler Inhale 1-2 puffs into the lungs every 6 (six) hours as needed for wheezing or shortness of breath. 02/17/18  Yes Shane Rhine, MD  Cholecalciferol (VITAMIN D3) 2000 units TABS Take 1 tablet by mouth daily.   Yes [provider]  cyclobenzaprine (FLEXERIL) 10 MG tablet Take 10  mg by mouth every 8 (eight) hours as needed for muscle spasms.   Yes [provider]  disulfiram (ANTABUSE) 250 MG tablet Take 250 mg by mouth daily.   Yes [provider]  divalproex (DEPAKOTE ER) 500 MG 24 hr tablet Take 4 tablets (2,000 mg total) by mouth at bedtime. 11/13/15  Yes Jimmy FootmanHernandez-Gonzalez, Andrea, MD    escitalopram (LEXAPRO) 20 MG tablet Take 20 mg by mouth every morning.   Yes [provider]  esomeprazole (NEXIUM) 40 MG capsule Take 40 mg by mouth daily at 12 noon.   Yes [provider]  ferrous sulfate 325 (65 FE) MG tablet Take 1 tablet (325 mg total) by mouth 2 (two) times daily with a meal. 01/11/16  Yes Regalado, Belkys A, MD  Fluticasone-Salmeterol (ADVAIR) 250-50 MCG/DOSE AEPB Inhale 1 puff into the lungs 2 (two) times daily.   Yes [provider]  gabapentin (NEURONTIN) 300 MG capsule Take 300 mg by mouth 3 (three) times daily.   Yes [provider]  hydrOXYzine (ATARAX/VISTARIL) 50 MG tablet Take 1 tablet (50 mg total) by mouth 3 (three) times daily. 05/07/17  Yes Erick BlinksMemon, Jehanzeb, MD  ibuprofen (ADVIL,MOTRIN) 800 MG tablet Take 800 mg by mouth 3 (three) times daily as needed for moderate pain.   Yes [provider]  lactulose (CHRONULAC) 10 GM/15ML solution Take 45 mLs (30 g total) by mouth 2 (two) times daily. 05/07/17  Yes Erick BlinksMemon, Jehanzeb, MD  Melatonin 5 MG TABS Take 10 mg by mouth at bedtime.   Yes [provider]  Multiple Vitamins-Minerals (THEREMS-M) TABS Take 1 tablet by mouth daily.   Yes [provider]  primidone (MYSOLINE) 250 MG tablet Take 250 mg by mouth 3 (three) times daily.   Yes [provider]  primidone (MYSOLINE) 50 MG tablet Take 50 mg by mouth 3 (three) times daily. Take with 250mg  with 50mg (300 mg tid)   Yes [provider]  QUEtiapine (SEROQUEL) 200 MG tablet Take 200 mg by mouth at bedtime.   Yes [provider]  doxycycline (VIBRAMYCIN) 100 MG capsule Take 1 capsule (100 mg total) by mouth 2 (two) times daily. One po bid x 7 days Patient not taking: Reported on 03/07/2018 02/17/18   Shane RhineWickline, Donald, MD  furosemide (LASIX) 20 MG tablet Take 1 tablet (20 mg total) by mouth daily. Patient not taking: Reported on 03/07/2018 04/22/17   Jacalyn [name], Julie, MD  predniSONE (DELTASONE)  50 MG tablet One tablet PO daily for 4 days Patient not taking: Reported on 03/07/2018 02/17/18   Shane RhineWickline, Donald, MD  traMADol (ULTRAM) 50 MG tablet Take 1 tablet (50 mg total) by mouth every 6 (six) hours as needed. Patient not taking: Reported on 03/07/2018 05/11/17   Bethann BerkshireZammit, Joseph, MD  traMADol (ULTRAM) 50 MG tablet Take 1 tablet (50 mg total) by mouth every 6 (six) hours as needed. Patient not taking: Reported on 03/07/2018 05/11/17   Bethann BerkshireZammit, Joseph, MD    Physical Exam: Vitals:   03/07/18 1030 03/07/18 1130 03/07/18 1145 03/07/18 1504  BP: 108/68 139/88  108/74  Pulse:   66 67  Resp:    16  Temp:    (!) 97.4 F (36.3 C)  TempSrc:    Oral  SpO2:   100% 98%  Weight:      Height:        Constitutional: NAD, calm, comfortable Vitals:   03/07/18 1030 03/07/18 1130 03/07/18 1145 03/07/18 1504  BP: 108/68 139/88  108/74  Pulse:  66 67  Resp:    16  Temp:    (!) 97.4 F (36.3 C)  TempSrc:    Oral  SpO2:   100% 98%  Weight:      Height:       Eyes: Anicteric sclera ENMT: Moist mucous membranes, poor dentition Neck: normal, supple, Respiratory: No increased work of breathing, no crackles, rales, rhonchi Cardiovascular: Regular rate and rhythm, no murmurs Abdomen: Soft, nontender, plus bowel sounds.  Musculoskeletal: No lower extremity swelling, mildly diminished range of motion in left lower extremity Skin: Over left ankle and dorsum of foot extending proximally noted to have area of erythema poorly demarcated with redness and swelling extending proximally, excoriation noted on anterior shin of left leg Neurologic: Grossly intact moving all extremities Psychiatric: Normal judgment and insight. Alert and oriented x 3. Normal mood.     Labs on Admission: I have personally reviewed following labs and imaging studies  CBC: Recent Labs  Lab 03/07/18 1120  WBC 10.3  NEUTROABS 7.5  HGB 15.4  HCT 44.4  MCV 93.7  PLT 205   Basic Metabolic Panel: Recent Labs  Lab  03/07/18 1120  NA 133*  K 3.5  CL 94*  CO2 26  GLUCOSE 83  BUN 10  CREATININE 0.81  CALCIUM 9.4   GFR: Estimated Creatinine Clearance: 119 mL/min (by C-G formula based on SCr of 0.81 mg/dL). Liver Function Tests: No results for input(s): AST, ALT, ALKPHOS, BILITOT, PROT, ALBUMIN in the last 168 hours. No results for input(s): LIPASE, AMYLASE in the last 168 hours. No results for input(s): AMMONIA in the last 168 hours. Coagulation Profile: No results for input(s): INR, PROTIME in the last 168 hours. Cardiac Enzymes: No results for input(s): CKTOTAL, CKMB, CKMBINDEX, TROPONINI in the last 168 hours. BNP (last 3 results) No results for input(s): PROBNP in the last 8760 hours. HbA1C: No results for input(s): HGBA1C in the last 72 hours. CBG: No results for input(s): GLUCAP in the last 168 hours. Lipid Profile: No results for input(s): CHOL, HDL, LDLCALC, TRIG, CHOLHDL, LDLDIRECT in the last 72 hours. Thyroid Function Tests: No results for input(s): TSH, T4TOTAL, FREET4, T3FREE, THYROIDAB in the last 72 hours. Anemia Panel: No results for input(s): VITAMINB12, FOLATE, FERRITIN, TIBC, IRON, RETICCTPCT in the last 72 hours. Urine analysis:    Component Value Date/Time   COLORURINE YELLOW (A) 05/13/2017 1141   APPEARANCEUR CLEAR (A) 05/13/2017 1141   APPEARANCEUR Clear 09/11/2014 2308   LABSPEC 1.015 05/13/2017 1141   LABSPEC 1.003 09/11/2014 2308   PHURINE 6.0 05/13/2017 1141   GLUCOSEU NEGATIVE 05/13/2017 1141   GLUCOSEU Negative 09/11/2014 2308   HGBUR NEGATIVE 05/13/2017 1141   BILIRUBINUR NEGATIVE 05/13/2017 1141   BILIRUBINUR Negative 09/11/2014 2308   KETONESUR NEGATIVE 05/13/2017 1141   PROTEINUR NEGATIVE 05/13/2017 1141   NITRITE NEGATIVE 05/13/2017 1141   LEUKOCYTESUR NEGATIVE 05/13/2017 1141   LEUKOCYTESUR Negative 09/11/2014 2308    Radiological Exams on Admission: Dg Ankle Complete Left  Result Date: 03/07/2018 CLINICAL DATA:  Pain and swelling for  several hours, no known injury, initial encounter EXAM: LEFT ANKLE COMPLETE - 3+ VIEW COMPARISON:  05/29/2012 FINDINGS: No acute fracture is identified. Healed fracture in the distal tibia is seen. Mild soft tissue swelling is noted. IMPRESSION: Mild soft tissue swelling without acute bony abnormality. Electronically Signed   By: Alcide Clever M.D.   On: 03/07/2018 12:31    EKG: Independently reviewed.  Not performed as not indicated  Assessment/Plan Active Problems:  Seizure disorder (HCC) and pseudoseizures   Tobacco abuse   MDD (major depressive disorder), recurrent severe, without psychosis (HCC)   GERD (gastroesophageal reflux disease)   Borderline personality disorder (HCC)   Alcohol use disorder, severe, in controlled environment (HCC)   Hyponatremia   COPD (chronic obstructive pulmonary disease) (HCC)   Cellulitis of left foot    #) Left lower extremity cellulitis: No elevated white count, no fevers, not particularly impressive. - Continue IV vancomycin, consider rapid transition to oral antibiotics tomorrow and discharge -Blood cultures x2 ordered on 03/07/2018  #) COPD: Unknown Gold stage, on oxygen with exertion only.  Currently on room air -Continue as needed bronchodilator -Continue ICS/LABA  #) Seizure disorder: -Continue divalproex 2000 mg nightly -Continue primidone 300 mg 3 times daily  #) Pain/psych: -Continue cycle Benzapril 10 mg every 8 hours as needed -Continue disulfiram 250 mg daily -Continue citalopram 20 mg every morning -Continue gabapentin 300 mg 3 times daily -Continue hydroxyzine 50 mg 3 times daily -Continue melatonin 10 mg nightly -Continue quetiapine 200 mg nightly  Fluids: Tolerating p.o. Electrolytes: Monitor and supplement Nutrition: Regular diet  Prophylaxis: Enoxaparin  Disposition: Pending improvement of cellulitis  Full code    Delaine Lame MD [facility]  If 7PM-7AM, please contact  night-coverage [email or web address] Password [email or web address]  03/07/2018, 3:20 PM

## 2018-03-07 NOTE — Progress Notes (Signed)
I spoke with [name] @ [place] social services. Pt listed legal [facility]

## 2018-03-07 NOTE — ED Triage Notes (Signed)
Woke up this am with left ankle swelling and redness

## 2018-03-07 NOTE — Progress Notes (Signed)

## 2018-03-07 NOTE — ED Provider Notes (Signed)
[facility] EMERGENCY DEPARTMENT Provider Note   CSN: [id number] Arrival date & time: 03/07/18  0930     History   Chief Complaint Chief Complaint  Patient presents with  . Joint Swelling    HPI [name] is a 42 y.o. male.  HPI   [name] is a 42 y.o. male who resides at a assisted living facility in [facility],  presents to the Emergency Department complaining of pain, redness, and swelling of the left ankle and left lower leg.  He states he noticed the symptoms this morning upon waking.  He states that he scratched his left lower leg a few days ago.  He also reports having shaking chills since yesterday.  States he was admitted here last year for similar symptoms.  He also describes a burning stinging pain to his left foot that is worse with weightbearing.  He also complains of excessive thirst and urination with a family history of diabetes.  He denies known injury, fever, nausea or vomiting, pain to the left knee or hip.   Past Medical History:  Diagnosis Date  . Anxiety   . Asthma   . CHF (congestive heart failure) (HCC)   . Depression   . Encephalopathy   . GERD (gastroesophageal reflux disease)   . Hypertension   . Hyponatremia 06/2016  . Renal disorder    kidney injury  . Seizures [facility])     Patient Active Problem List   Diagnosis Date Noted  . Chronic respiratory failure with hypoxia (HCC) 05/03/2017  . COPD (chronic obstructive pulmonary disease) (HCC) 05/03/2017  . Pedal edema 05/03/2017  . Altered mental state 05/02/2017  . Acute on chronic respiratory failure with hypoxia (HCC) 04/05/2017  . COPD exacerbation (HCC) 04/03/2017  . Musculoskeletal pain 06/28/2016  . Left flank pain   . Hyponatremia 06/27/2016  . Acute encephalopathy 01/06/2016  . 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 Pleural effusion, right 12/31/2015  . GERD (gastroesophageal reflux disease) 11/11/2015  . Borderline personality disorder (HCC) 11/11/2015    . Alcohol use disorder, severe, in controlled environment (HCC) 11/11/2015  . 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 Tobacco abuse 11/05/2013  . HTN (hypertension) 11/05/2013    Past Surgical History:  Procedure Laterality Date  . FINGER SURGERY Left    5th digit  . PLEURAL EFFUSION DRAINAGE Right 01/02/2016   Procedure: DRAINAGE OF PLEURAL EFFUSION;  Surgeon: Loreli Slot, MD;  Location: [facility] OR;  Service: Thoracic;  Laterality: Right;  Marland Kitchen VIDEO ASSISTED THORACOSCOPY (VATS)/DECORTICATION Right 01/02/2016   Procedure: VIDEO ASSISTED THORACOSCOPY (VATS)/DECORTICATION;  Surgeon: Loreli Slot, MD;  Location: [facility] OR;  Service: Thoracic;  Laterality: Right;  Marland Kitchen VIDEO BRONCHOSCOPY N/A 01/02/2016   Procedure: VIDEO BRONCHOSCOPY;  Surgeon: Loreli Slot, MD;  Location: [facility] OR;  Service: Thoracic;  Laterality: N/A;        Home Medications    Prior to Admission medications   Medication Sig Start Date End Date Taking? Authorizing Provider  albuterol (PROVENTIL HFA;VENTOLIN HFA) 108 (90 Base) MCG/ACT inhaler Inhale 2 puffs into the lungs every 4 (four) hours as needed for wheezing or shortness of breath.    [provider]  albuterol (PROVENTIL HFA;VENTOLIN HFA) 108 (90 Base) MCG/ACT inhaler Inhale 1-2 puffs into the lungs every 6 (six) hours as needed for wheezing or shortness of breath. 02/17/18   Zadie Rhine, MD  cyclobenzaprine (FLEXERIL) 10  MG tablet Take 10 mg by mouth every 8 (eight) hours as needed for muscle spasms.    [provider]  disulfiram (ANTABUSE) 250 MG tablet Take 250 mg by mouth daily.    [provider]  divalproex (DEPAKOTE ER) 500 MG 24 hr tablet Take 4 tablets (2,000 mg total) by mouth at bedtime. 11/13/15   Jimmy Footman, MD  doxycycline (VIBRAMYCIN) 100 MG capsule Take 1 capsule (100 mg total) by mouth 2  (two) times daily. One po bid x 7 days 02/17/18   Zadie Rhine, MD  escitalopram (LEXAPRO) 20 MG tablet Take 20 mg by mouth every morning.    [provider]  esomeprazole (NEXIUM) 40 MG capsule Take 40 mg by mouth daily at 12 noon.    [provider]  ferrous sulfate 325 (65 FE) MG tablet Take 1 tablet (325 mg total) by mouth 2 (two) times daily with a meal. 01/11/16   Regalado, Belkys A, MD  Fluticasone-Salmeterol (ADVAIR) 250-50 MCG/DOSE AEPB Inhale 1 puff into the lungs 2 (two) times daily.    [provider]  furosemide (LASIX) 20 MG tablet Take 1 tablet (20 mg total) by mouth daily. 04/22/17   Jacalyn Lefevre, MD  gabapentin (NEURONTIN) 300 MG capsule Take 300 mg by mouth 3 (three) times daily.    [provider]  hydrOXYzine (ATARAX/VISTARIL) 50 MG tablet Take 1 tablet (50 mg total) by mouth 3 (three) times daily. 05/07/17   Erick Blinks, MD  ibuprofen (ADVIL,MOTRIN) 800 MG tablet Take 800 mg by mouth 3 (three) times daily as needed for moderate pain.    [provider]  lactulose (CHRONULAC) 10 GM/15ML solution Take 45 mLs (30 g total) by mouth 2 (two) times daily. 05/07/17   Erick Blinks, MD  Melatonin 5 MG TABS Take 10 mg by mouth at bedtime.    [provider]  Multiple Vitamins-Minerals (THEREMS-M) TABS Take 1 tablet by mouth daily.    [provider]  predniSONE (DELTASONE) 50 MG tablet One tablet PO daily for 4 days 02/17/18   Zadie Rhine, MD  primidone (MYSOLINE) 250 MG tablet Take 250 mg by mouth 3 (three) times daily.    [provider]  QUEtiapine (SEROQUEL) 200 MG tablet Take 200 mg by mouth at bedtime.    [provider]  traMADol (ULTRAM) 50 MG tablet Take 1 tablet (50 mg total) by mouth every 6 (six) hours as needed. 05/11/17   Bethann Berkshire, MD  traMADol (ULTRAM) 50 MG tablet Take 1 tablet (50 mg total) by mouth every 6 (six) hours as needed. 05/11/17   Bethann Berkshire, MD    Family  History Family History  Problem Relation Age of Onset  . CAD Mother   . Diabetes Mellitus II Father   . Prostate cancer Paternal Grandfather     Social History Social History   Tobacco Use  . Smoking status: Current Every Day Smoker    Packs/day: 0.50    Years: 20.00    Pack years: 10.00    Types: Cigarettes  . Smokeless tobacco: Never Used  Substance Use Topics  . Alcohol use: No    Comment: pt states he no longer drinks  . Drug use: No     Allergies   Penicillins   Review of Systems Review of Systems  Constitutional: Negative for chills and fever.  Gastrointestinal: Negative for abdominal pain, nausea and vomiting.  Musculoskeletal: Positive for arthralgias (Left lower leg and ankle pain) and joint swelling.  Skin: Positive for color change.  Neurological: Negative for dizziness, weakness, numbness and headaches.  All other systems reviewed and are negative.    Physical Exam Updated Vital Signs BP 137/81 (BP Location: Left Arm)   Pulse 61   Temp 98.2 F (36.8 C) (Oral)   Resp 15   Ht 5\' 11"  (1.803 m)   Wt 70.8 kg (156 lb)   SpO2 100%   BMI 21.76 kg/m   Physical Exam  Constitutional: He is oriented to person, place, and time. He appears well-developed and well-nourished. No distress.  HENT:  Head: Atraumatic.  Mouth/Throat: Oropharynx is clear and moist.  Cardiovascular: Normal rate, regular rhythm and intact distal pulses.  DP pulses are strong and palpable bilaterally.  Pulmonary/Chest: Effort normal and breath sounds normal.  Musculoskeletal: He exhibits tenderness. He exhibits no deformity.  Diffuse tenderness to palpation of the medial aspect of the left ankle.  Mild to moderate edema noted.  Erythema noted to the left lower leg and ankle and extends to the dorsal foot.  Some mild erythema also noted to the dorsal right foot as well.  Neurological: He is alert and oriented to person, place, and time. No sensory deficit. He exhibits normal muscle  tone. Coordination normal.  Skin: Skin is warm. Capillary refill takes less than 2 seconds.  Linear abrasion to the anterior left lower leg.  Appears old.  Nursing note and vitals reviewed.      ED Treatments / Results  Labs (all labs ordered are listed, but only abnormal results are displayed) Labs Reviewed  BASIC METABOLIC PANEL - Abnormal; Notable for the following components:      Result Value   Sodium 133 (*)    Chloride 94 (*)    All other components within normal limits  CBC WITH DIFFERENTIAL/PLATELET - Abnormal; Notable for the following components:   Monocytes Absolute 1.1 (*)    All other components within normal limits  CULTURE, BLOOD (ROUTINE X 2)  CULTURE, BLOOD (ROUTINE X 2)  LACTIC ACID, PLASMA    EKG None  Radiology Dg Ankle Complete Left  Result Date: 03/07/2018 CLINICAL DATA:  Pain and swelling for several hours, no known injury, initial encounter EXAM: LEFT ANKLE COMPLETE - 3+ VIEW COMPARISON:  05/29/2012 FINDINGS: No acute fracture is identified. Healed fracture in the distal tibia is seen. Mild soft tissue swelling is noted. IMPRESSION: Mild soft tissue swelling without acute bony abnormality. Electronically Signed   By: Alcide Clever M.D.   On: 03/07/2018 12:31    Procedures Procedures (including critical care time)  Medications Ordered in ED Medications - No data to display   Initial Impression / Assessment and Plan / ED Course  I have reviewed the triage vital signs and the nursing notes.  Pertinent labs & imaging results that were available during my care of the patient were reviewed by me and considered in my medical decision making (see chart for details).     Pt also seen by Dr. Estell Harpin and care plan discussed.    Pt is well appearing, non-toxic with hx of LE cellulitis. He does have multiple co morbities and I feel may not fair well with out patient therapy.  Will consult hospitalist for admission.    1515  Consulted Dr. Clearnce Sorrel who  agrees to admit and will see pt in the ER  Final Clinical Impressions(s) / ED Diagnoses   Final diagnoses:  Cellulitis of left lower extremity    ED Discharge Orders    None  Pauline Ausriplett, Nigil Braman, PA-C 03/07/18 1530    Bethann BerkshireZammit, Joseph, MD 03/08/18 [phone]

## 2018-03-07 NOTE — ED Notes (Signed)
Pt taken to xray 

## 2018-03-07 NOTE — ED Notes (Signed)
PA aware pt wanting to speak with her

## 2018-03-07 NOTE — Progress Notes (Signed)
Pharmacy Antibiotic Note  [name] is a 42 y.o. male admitted on 03/07/2018 with cellulitis.  Pharmacy has been consulted for Vancomycin dosing.  Plan: Vancomycin 1000mg  given in ED, continue with 1250mg  IV every 12 hours.  Goal trough 10-15 mcg/mL.  F/U cxs and clinical progress Monitor V/S, labs, and levels as indicated  Height: 5\' 11"  (180.3 cm) Weight: 156 lb (70.8 kg) IBW/kg (Calculated) : 75.3  Temp (24hrs), Avg:97.8 F (36.6 C), Min:97.4 F (36.3 C), Max:98.2 F (36.8 C)  Recent Labs  Lab 03/07/18 1120  WBC 10.3  CREATININE 0.81  LATICACIDVEN 0.9    Estimated Creatinine Clearance: 119 mL/min (by C-G formula based on SCr of 0.81 mg/dL).    Allergies  Allergen Reactions  . Penicillins Nausea And Vomiting    Has patient had a PCN reaction causing immediate rash, facial/tongue/throat swelling, SOB or lightheadedness with hypotension:No Has patient had a PCN reaction causing severe rash involving mucus membranes or skin necrosis:No Has patient had a PCN reaction that required hospitalization:No Has patient had a PCN reaction occurring within the last 10 years:NO If all of the above answers are "NO", then may proceed with Cephalosporin use.      Antimicrobials this admission: Vancomycin 4/17>>   Dose adjustments this admission: N/A  Microbiology results: 4/17 BCx: pending  Thank you for allowing pharmacy to be a part of this patient's care.  [name], BS Loura Backharm D, [place] Clinical Pharmacist Pager [phone] 03/07/2018 3:36 PM

## 2018-03-08 DIAGNOSIS — Z720 Tobacco use: Secondary | ICD-10-CM

## 2018-03-08 DIAGNOSIS — L03116 Cellulitis of left lower limb: Secondary | ICD-10-CM | POA: Diagnosis not present

## 2018-03-08 DIAGNOSIS — F332 Major depressive disorder, recurrent severe without psychotic features: Secondary | ICD-10-CM

## 2018-03-08 DIAGNOSIS — G40909 Epilepsy, unspecified, not intractable, without status epilepticus: Secondary | ICD-10-CM

## 2018-03-08 DIAGNOSIS — F1020 Alcohol dependence, uncomplicated: Secondary | ICD-10-CM

## 2018-03-08 DIAGNOSIS — E871 Hypo-osmolality and hyponatremia: Secondary | ICD-10-CM

## 2018-03-08 DIAGNOSIS — F603 Borderline personality disorder: Secondary | ICD-10-CM | POA: Diagnosis not present

## 2018-03-08 DIAGNOSIS — K219 Gastro-esophageal reflux disease without esophagitis: Secondary | ICD-10-CM | POA: Diagnosis not present

## 2018-03-08 LAB — CBC
HCT: 39.7 % (ref 39.0–52.0)
Hemoglobin: 13.7 g/dL (ref 13.0–17.0)
MCH: 31.8 pg (ref 26.0–34.0)
MCHC: 34.5 g/dL (ref 30.0–36.0)
MCV: 92.1 fL (ref 78.0–100.0)
Platelets: 156 K/uL (ref 150–400)
RBC: 4.31 MIL/uL (ref 4.22–5.81)
RDW: 12.7 % (ref 11.5–15.5)
WBC: 4.2 K/uL (ref 4.0–10.5)

## 2018-03-08 LAB — BASIC METABOLIC PANEL
BUN: 14 mg/dL (ref 6–20)
CO2: 25 mmol/L (ref 22–32)
Chloride: 94 mmol/L — ABNORMAL LOW (ref 101–111)
Creatinine, Ser: 0.71 mg/dL (ref 0.61–1.24)
Sodium: 131 mmol/L — ABNORMAL LOW (ref 135–145)

## 2018-03-08 LAB — BASIC METABOLIC PANEL WITH GFR
Anion gap: 12 (ref 5–15)
Calcium: 8.9 mg/dL (ref 8.9–10.3)
GFR calc Af Amer: >60 mL/min (ref >60)
GFR calc non Af Amer: >60 mL/min (ref >60)
Glucose, Bld: 91 mg/dL (ref 65–99)
Potassium: 3.6 mmol/L (ref 3.5–5.1)

## 2018-03-08 MED ORDER — SULFAMETHOXAZOLE-TRIMETHOPRIM 800-160 MG PO TABS: 1.0000 | ORAL_TABLET | Freq: Two times a day (BID) | ORAL | 0 refills | Status: AC

## 2018-03-08 MED ORDER — CYCLOBENZAPRINE HCL 10 MG PO TABS: 10.0000 mg | ORAL_TABLET | Freq: Two times a day (BID) | ORAL | Status: DC | PRN

## 2018-03-08 MED ORDER — DICLOFENAC SODIUM 75 MG PO TBEC: DELAYED_RELEASE_TABLET | ORAL | 0 refills | Status: DC

## 2018-03-08 MED ORDER — FUROSEMIDE 20 MG PO TABS: 20.0000 mg | ORAL_TABLET | Freq: Every day | ORAL | 1 refills | Status: DC

## 2018-03-08 MED ORDER — ENSURE ENLIVE PO LIQD: 237.0000 mL | Freq: Two times a day (BID) | ORAL | 3 refills | Status: DC

## 2018-03-08 NOTE — Progress Notes (Signed)
Removed IV-clean, dry, intact. Called legal guardian. Reviewed d/c paperwork with pt. Reviewed new medications. Answered all questions. Staff from [facility] came to pick him up.

## 2018-03-08 NOTE — Discharge Summary (Signed)
Physician Discharge Summary  [name] ZOX:[id number] DOB: Jul 23, 1976 DOA: 03/07/2018  PCP: Patient, No Pcp Per  Admit date: 03/07/2018 Discharge date: 03/08/2018  Time spent: 35 minutes  Recommendations for Outpatient Follow-up:  1. Assess complete resolution of patient's left lower extremity cellulitis 2. Repeat basic metabolic panel to follow electrolytes and renal function   Discharge Diagnoses:  Active Problems:   Seizure disorder (HCC) and pseudoseizures   Tobacco abuse   MDD (major depressive disorder), recurrent severe, without psychosis (HCC)   GERD (gastroesophageal reflux disease)   Borderline personality disorder (HCC)   Alcohol use disorder, severe, in controlled environment (HCC)   Hyponatremia   COPD (chronic obstructive pulmonary disease) (HCC)   Cellulitis of left foot   Cellulitis of left lower extremity   Discharge Condition: Stable and improved.  Patient discharged back home (assisted living facility) with instructions to establish care with her PCP and to take medications as prescribed.  Diet recommendation: Heart healthy diet.  Filed Weights   03/07/18 0933  Weight: 70.8 kg (156 lb)    History of present illness:  As per H&P written by Dr. Clearnce Sorrel on 03/07/18 42 y.o. male with medical history significant of past medical history relevant for multiple psychiatric disorders, seizure disorder, alcohol abuse in remission, chronic hyponatremia, COPD on oxygen only with exertion who comes in with left lower extremity cellulitis.  Patient reports that this morning he woke up with spreading erythema and swelling over his left ankle extended proximally.  He denies any fevers however reports his temperature was not checked.  He did report chills.  He denies any nausea, vomiting, diarrhea.  He denies any cough, congestion, chest pain, lower extremity edema, joint pain that is new.  Hospital Course:  1-no supportive left lower extremity cellulitis: Patient has  remained afebrile, no elevation of WBCs appreciated, no nausea, no vomiting. -Reports improvement in his pain and swelling, even is still having some discomfort. Blood cultures without any growth Patient will be discharged on Bactrim BID to complete 10 days of antibiotics -instructed to keep himself well hydrated  And to maintain leg elvated as much as possible -diclofenac Q12Hr as needed for pain and swelling has been prescribed .  2-COPD: Unknown: Stage -Currently without any wheezings or complaints of shortness of breath -Continue ICS/LABA -Will need outpatient follow-up and most likely repeat of PFTs.  3-seizure disorders -No seizure activity appreciated during hospitalization -will continue the use of Divalproex X 2000 mg nightly and also primidone 300 mg 3 times a day.  4-anxiety/major depression -Continue Celexa, hydroxyzine and Seroquel -No suicidal ideation or hallucinations currently -Continue outpatient follow-up with psychiatry.  5-chronic pain syndrome -Continue Neurontin and cyclobenzaprine as needed   6-prior history of alcohol abuse and tobacco abuse -Patient currently living in assisting living facility and expressed that he had not been drinking. -advise to keep himself alcohol free  -continue the use of disulfiram  -Smoking cessation counseling has also been provided -Patient has declined the use of nicotine   7-Insomnia -continue melatonin   8-hyponatremia and chronic cirrhosis -continue daily lasix -advise to avoid future use of alcohol -maintain adequate hydration and nutrition  -repeat BMET as an outpatient  -continue lactulose    Procedures:  See below for x-ray reports.  Consultations:  None  Discharge Exam: Vitals:   03/08/18 0536 03/08/18 0848  BP: 115/69   Pulse: (!) 47   Resp: 18   Temp: 98.1 F (36.7 C)   SpO2: 100% 97%    General: Afebrile,  no chest pain, no shortness of breath, no nausea vomiting.  Patient reporting some  discomfort in his left food associated with swelling and cellulitic process. Cardiovascular: S1 and S2, no rubs, no gallops, no JVD. Respiratory: Good air movement bilaterally, no wheezing, no crackles Abdomen: Soft, nontender, nondistended, positive bowel sounds. Extremities: Left lower extremity with mild swelling, erythema and tenderness to palpation affecting left internal aspect of his ankle and pedal area. No other joint abnormality appreciated. No cyanosis. Neurology: Currently oriented x3, no focal deficit, cranial nerves grossly intact, patient able to follow commands appropriately.   Discharge Instructions   Discharge Instructions    Diet - low sodium heart healthy   Complete by:  As directed    Discharge instructions   Complete by:  As directed    Maintain leg elevated, use cold compresses, take medications as prescribed. Keep yourself well-hydrated Follow-up with outpatient provider as instructed (follow instructions given by case manager and oriented to set up a primary care doctor and to arrange hospital follow-up visit and visit to establish future care).     Allergies as of 03/08/2018      Reactions   Penicillins Nausea And Vomiting   Has patient had a PCN reaction causing immediate rash, facial/tongue/throat swelling, SOB or lightheadedness with hypotension:No Has patient had a PCN reaction causing severe rash involving mucus membranes or skin necrosis:No Has patient had a PCN reaction that required hospitalization:No Has patient had a PCN reaction occurring within the last 10 years:NO If all of the above answers are "NO", then may proceed with Cephalosporin use.      Medication List    STOP taking these medications   doxycycline 100 MG capsule Commonly known as:  VIBRAMYCIN   ibuprofen 800 MG tablet Commonly known as:  ADVIL,MOTRIN   predniSONE 50 MG tablet Commonly known as:  DELTASONE   traMADol 50 MG tablet Commonly known as:  ULTRAM     TAKE these  medications   albuterol 108 (90 Base) MCG/ACT inhaler Commonly known as:  PROVENTIL HFA;VENTOLIN HFA Inhale 1-2 puffs into the lungs every 6 (six) hours as needed for wheezing or shortness of breath.   cyclobenzaprine 10 MG tablet Commonly known as:  FLEXERIL Take 1 tablet (10 mg total) by mouth every 12 (twelve) hours as needed for muscle spasms. What changed:  when to take this   diclofenac 75 MG EC tablet Commonly known as:  VOLTAREN Take 1 tablet by mouth twice a day for 3 days; then use q12hr as needd for pain.   disulfiram 250 MG tablet Commonly known as:  ANTABUSE Take 250 mg by mouth daily.   divalproex 500 MG 24 hr tablet Commonly known as:  DEPAKOTE ER Take 4 tablets (2,000 mg total) by mouth at bedtime.   escitalopram 20 MG tablet Commonly known as:  LEXAPRO Take 20 mg by mouth every morning.   esomeprazole 40 MG capsule Commonly known as:  NEXIUM Take 40 mg by mouth daily at 12 noon.   feeding supplement (ENSURE ENLIVE) Liqd Take 237 mLs by mouth 2 (two) times daily between meals.   ferrous sulfate 325 (65 FE) MG tablet Take 1 tablet (325 mg total) by mouth 2 (two) times daily with a meal.   Fluticasone-Salmeterol 250-50 MCG/DOSE Aepb Commonly known as:  ADVAIR Inhale 1 puff into the lungs 2 (two) times daily.   furosemide 20 MG tablet Commonly known as:  LASIX Take 1 tablet (20 mg total) by mouth daily.   gabapentin  300 MG capsule Commonly known as:  NEURONTIN Take 300 mg by mouth 3 (three) times daily.   hydrOXYzine 50 MG tablet Commonly known as:  ATARAX/VISTARIL Take 1 tablet (50 mg total) by mouth 3 (three) times daily.   lactulose 10 GM/15ML solution Commonly known as:  CHRONULAC Take 45 mLs (30 g total) by mouth 2 (two) times daily.   Melatonin 5 MG Tabs Take 10 mg by mouth at bedtime.   primidone 250 MG tablet Commonly known as:  MYSOLINE Take 250 mg by mouth 3 (three) times daily.   primidone 50 MG tablet Commonly known as:   MYSOLINE Take 50 mg by mouth 3 (three) times daily. Take with 250mg  with 50mg (300 mg tid)   QUEtiapine 200 MG tablet Commonly known as:  SEROQUEL Take 200 mg by mouth at bedtime.   sulfamethoxazole-trimethoprim 800-160 MG tablet Commonly known as:  BACTRIM DS,SEPTRA DS Take 1 tablet by mouth 2 (two) times daily for 10 days.   THEREMS-M Tabs Take 1 tablet by mouth daily.   Vitamin D3 2000 units Tabs Take 1 tablet by mouth daily.      Allergies  Allergen Reactions  . Penicillins Nausea And Vomiting    Has patient had a PCN reaction causing immediate rash, facial/tongue/throat swelling, SOB or lightheadedness with hypotension:No Has patient had a PCN reaction causing severe rash involving mucus membranes or skin necrosis:No Has patient had a PCN reaction that required hospitalization:No Has patient had a PCN reaction occurring within the last 10 years:NO If all of the above answers are "NO", then may proceed with Cephalosporin use.       The results of significant diagnostics from this hospitalization (including imaging, microbiology, ancillary and laboratory) are listed below for reference.    Significant Diagnostic Studies: Dg Ankle Complete Left  Result Date: 03/07/2018 CLINICAL DATA:  Pain and swelling for several hours, no known injury, initial encounter EXAM: LEFT ANKLE COMPLETE - 3+ VIEW COMPARISON:  05/29/2012 FINDINGS: No acute fracture is identified. Healed fracture in the distal tibia is seen. Mild soft tissue swelling is noted. IMPRESSION: Mild soft tissue swelling without acute bony abnormality. Electronically Signed   By: Alcide CleverMark  Lukens M.D.   On: 03/07/2018 12:31   Dg Chest Port 1 View  Result Date: 02/17/2018 CLINICAL DATA:  42 year old male with shortness of breath and cough. EXAM: PORTABLE CHEST 1 VIEW COMPARISON:  Chest radiograph dated 05/06/2017 FINDINGS: There is mild diffuse interstitial prominence similar to prior radiograph. No focal consolidation, pleural  effusion, or pneumothorax. The cardiac silhouette is within normal limits. No acute osseous pathology. IMPRESSION: Probable mild congestion. Pneumonia is not excluded. Clinical correlation is recommended. Electronically Signed   By: Elgie CollardArash  Radparvar M.D.   On: 02/17/2018 01:24    Microbiology: Recent Results (from the past 240 hour(s))  Culture, blood (routine x 2)     Status: None (Preliminary result)   Collection Time: 03/07/18 11:20 AM  Result Value Ref Range Status   Specimen Description RIGHT ANTECUBITAL  Final   Special Requests   Final    BOTTLES DRAWN AEROBIC AND ANAEROBIC Blood Culture adequate volume   Culture   Final    NO GROWTH < 24 HOURS Performed at [facility], [address] [place], [place] [id number]    Report Status PENDING  Incomplete  Culture, blood (routine x 2)     Status: None (Preliminary result)   Collection Time: 03/07/18 11:20 AM  Result Value Ref Range Status   Specimen Description BLOOD LEFT  ARM  Final   Special Requests   Final    BOTTLES DRAWN AEROBIC AND ANAEROBIC Blood Culture adequate volume   Culture   Final    NO GROWTH < 24 HOURS Performed at [facility], [address] [place], [place] [address]    Report Status PENDING  Incomplete     Labs: Basic Metabolic Panel: Recent Labs  Lab 03/07/18 1120 03/08/18 0612  NA 133* 131*  K 3.5 3.6  CL 94* 94*  CO2 26 25  GLUCOSE 83 91  BUN 10 14  CREATININE 0.81 0.71  CALCIUM 9.4 8.9   CBC: Recent Labs  Lab 03/07/18 1120 03/08/18 0612  WBC 10.3 4.2  NEUTROABS 7.5  --   HGB 15.4 13.7  HCT 44.4 39.7  MCV 93.7 92.1  PLT 205 156    BNP (last 3 results) Recent Labs    05/02/17 2146 05/13/17 1139 02/17/18 0054  BNP 40.0 86.0 22.0     Signed:  Vassie Loll MD.  [facility] 03/08/2018, 11:23 AM

## 2018-03-08 NOTE — Plan of Care (Signed)
progressing 

## 2018-03-12 LAB — CULTURE, BLOOD (ROUTINE X 2)
CULTURE: NO GROWTH
Culture: NO GROWTH
Special Requests: ADEQUATE
Special Requests: ADEQUATE

## 2018-03-18 IMAGING — CR DG CHEST 2V
2 series · 2 of 2 positions shown · non-contrast
Comparison: Radiograph dated 01/26/2016 and chest CT dated
01/20/2016

CLINICAL DATA: 39-year-old male with shortness of breath

EXAM:
CHEST  2 VIEW

[w chest pa]
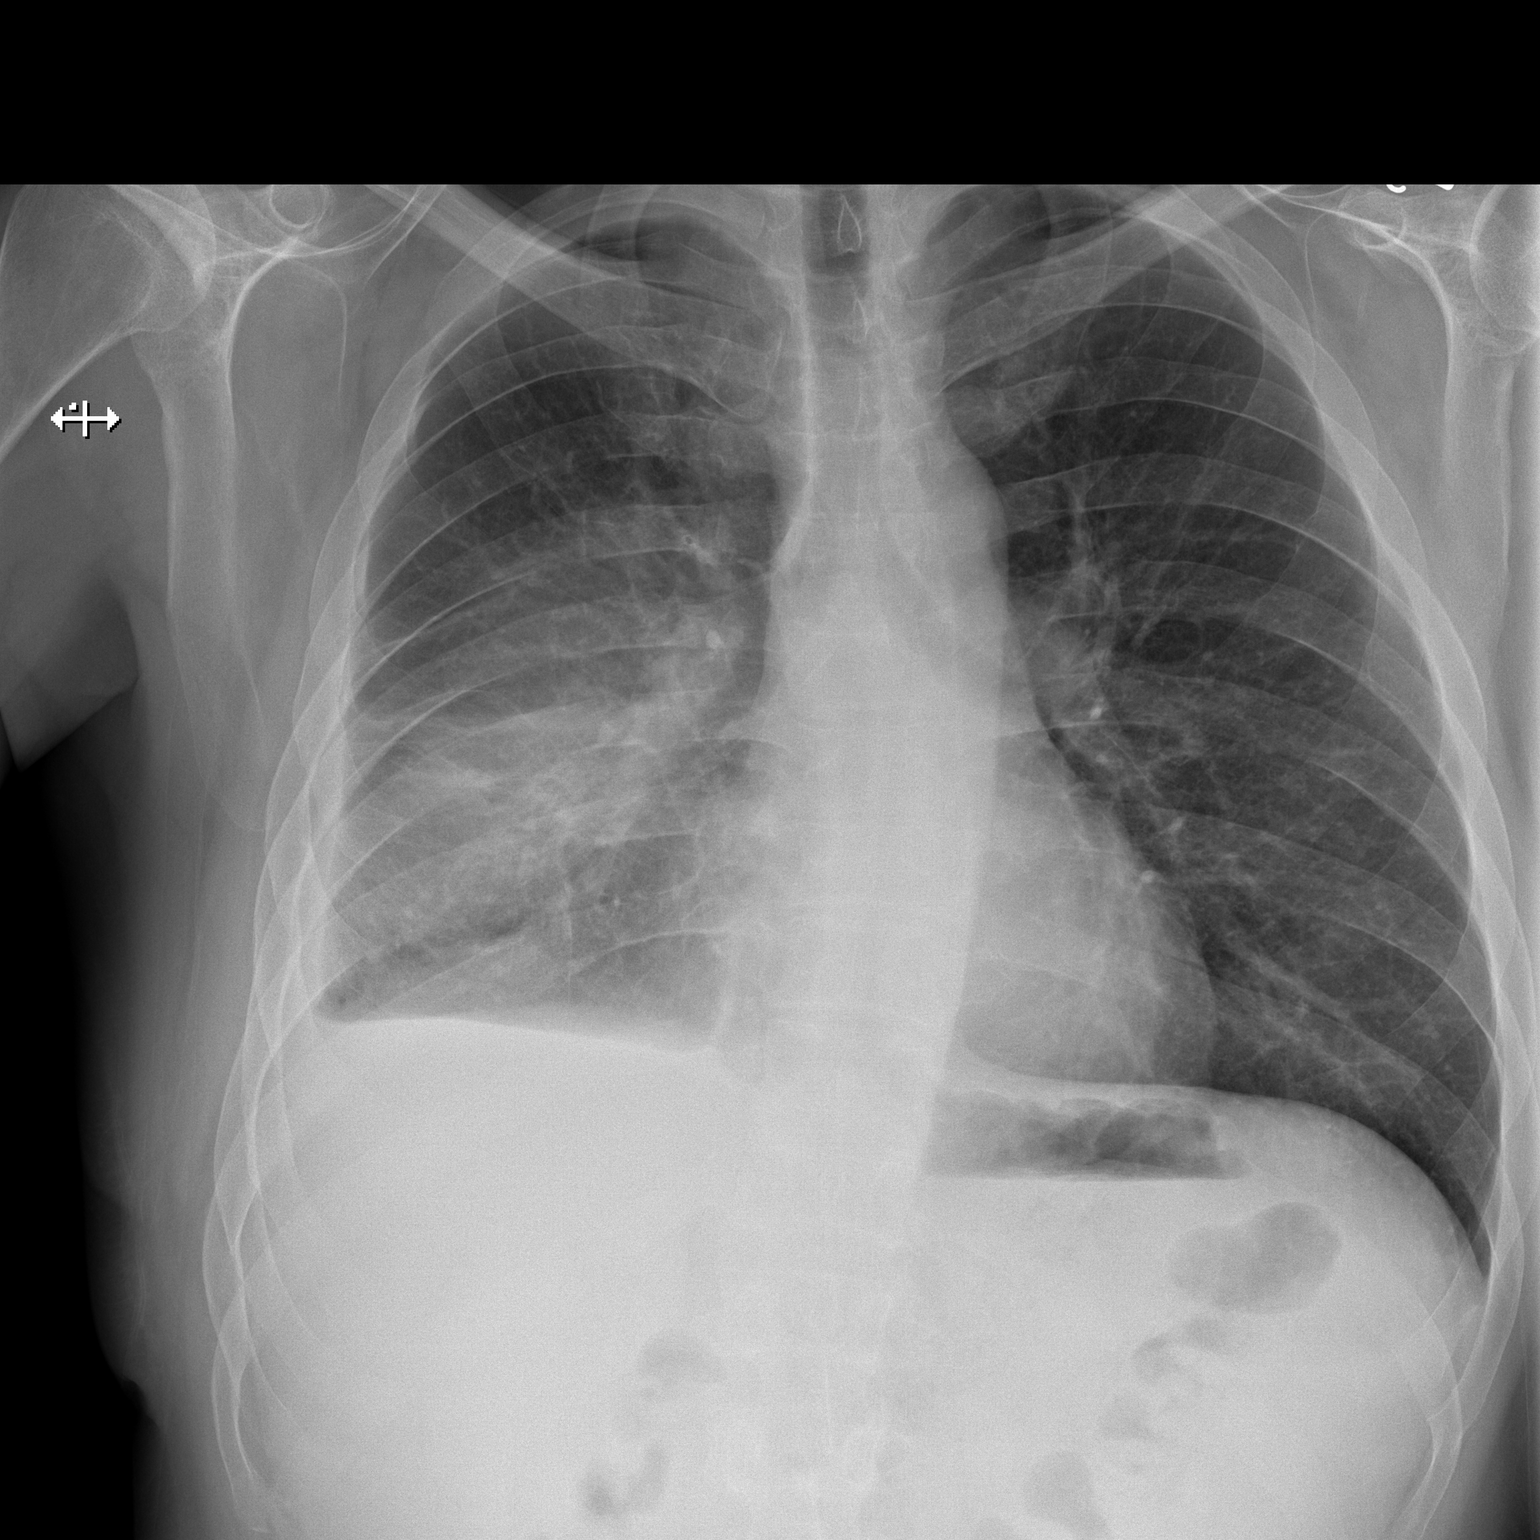

[w chest lat]
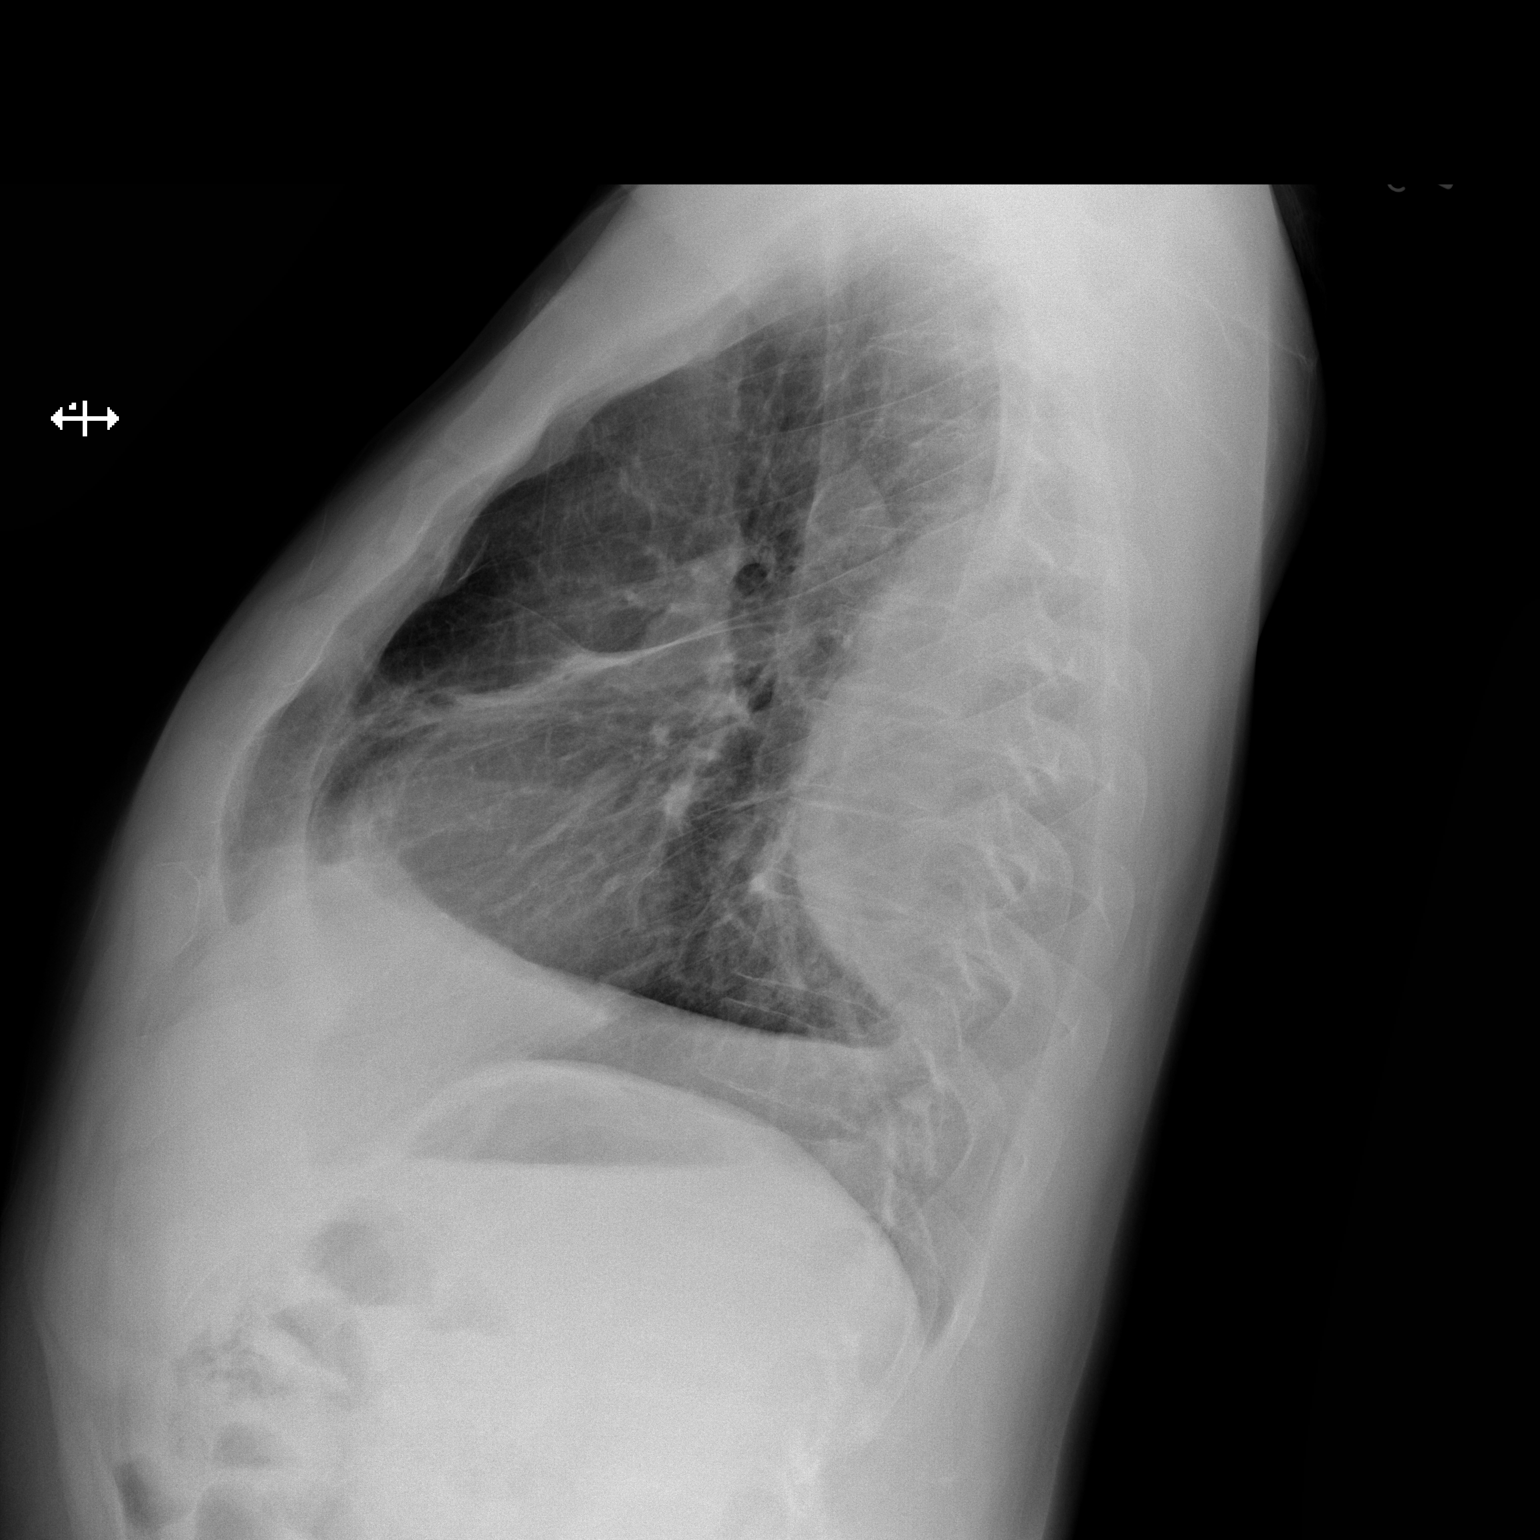

[2 of 2 positions shown; findings below may reference images not displayed]

FINDINGS: Two views of the chest demonstrate a grossly stable appearing
moderate-sized loculated pleural effusion on the right posteriorly.
A small right pleural effusion is noted at the right lung base.
There is diffuse increased density in the mid to lower lung field
which is slightly improved compared to prior study and may be
related to atelectatic changes or pneumonia. The left lung is clear.
There is no pleural effusion on the left. There is no pneumothorax.
The cardiac silhouette is within normal limits. No acute osseous
pathology identified.
IMPRESSION: Grossly stable appearing loculated right pleural effusion as well as
a small pleural effusion at the right lung base. Overall there is
slight improvement of the airspace density in the mid to lower right
lung field. Continued follow-up recommended.

## 2018-04-11 IMAGING — CT CT ABD-PELV W/ CM
2 of 5 series · 16 of 46 positions shown, 18 images · IV contrast (Omni 300)
Comparison: CT the abdomen and pelvis 09/11/2008.

CLINICAL DATA: 39-year-old male with history of intermittent
abdominal pain for the past 2 years, worsening over the past 3 days.
Nausea, vomiting and diarrhea. History of pancreatitis.

EXAM:
CT ABDOMEN AND PELVIS WITH CONTRAST
TECHNIQUE: Multidetector CT imaging of the abdomen and pelvis was performed
using the standard protocol following bolus administration of
intravenous contrast.
CONTRAST:  100mL HAZ7CY-GCC IOPAMIDOL ([id number]-GCC) INJECTION 61%

[Series 2: a/p w/ 5mm · axial · 0.80mm/px · z∈[-481,-76]mm · 13 of 91 slices shown, 15 images]
[im 5/91  soft-tissue]
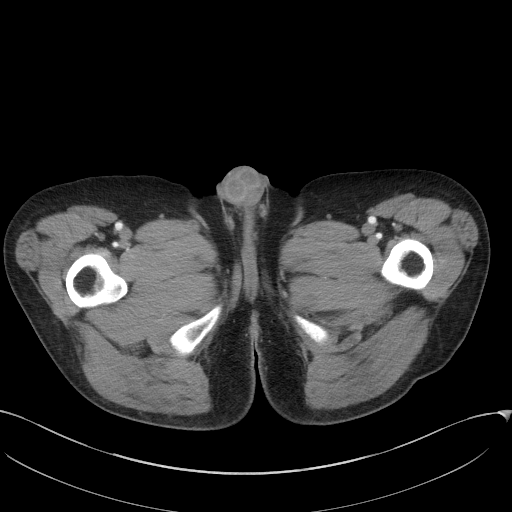
[im 5/91  bone]
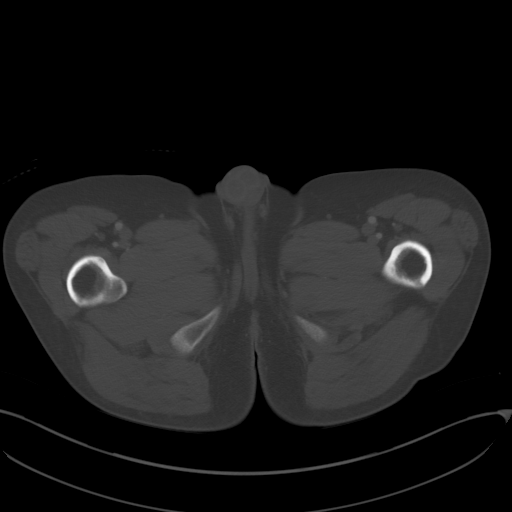
[im 15/91  soft-tissue]
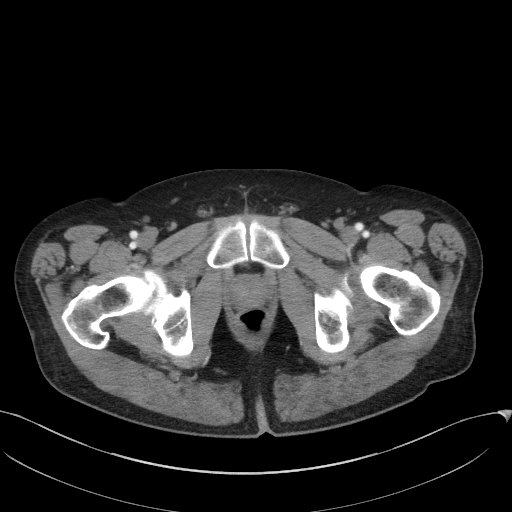
[im 19/91  soft-tissue]
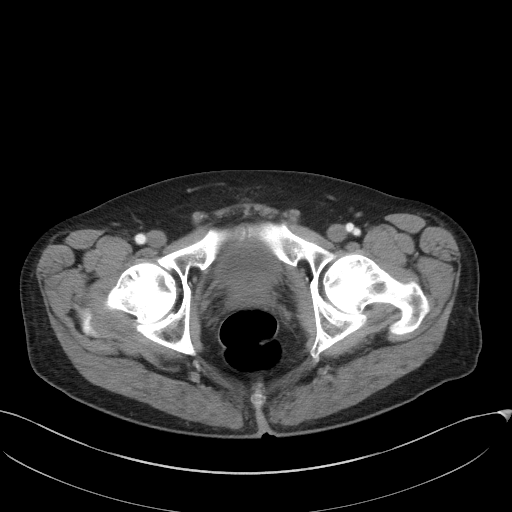
[im 24/91  soft-tissue]
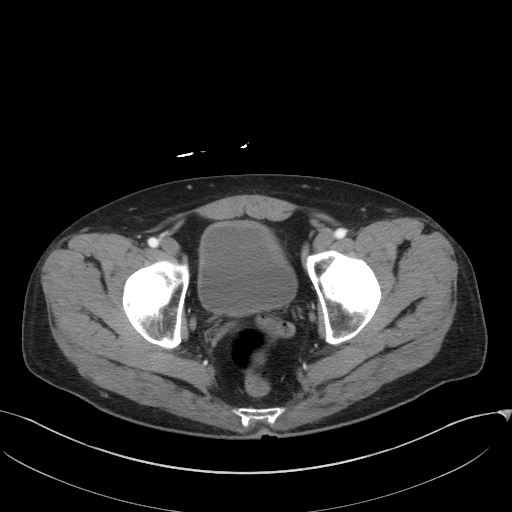
[im 34/91  soft-tissue]
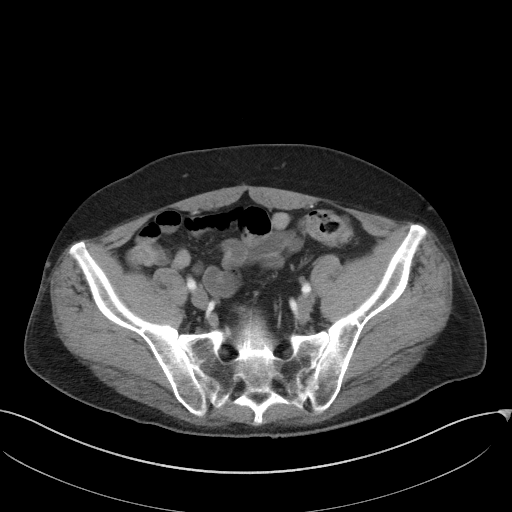
[im 38/91  soft-tissue]
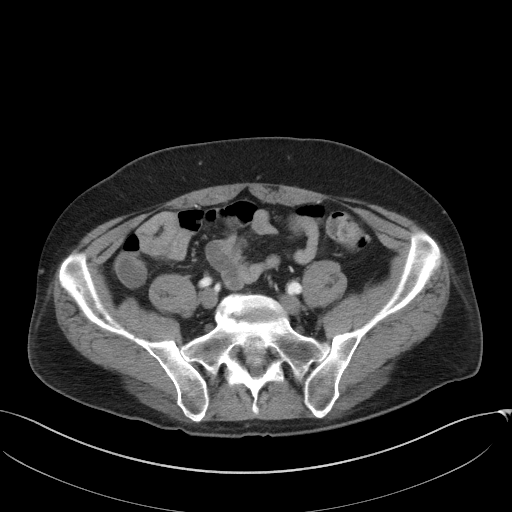
[im 48/91  soft-tissue]
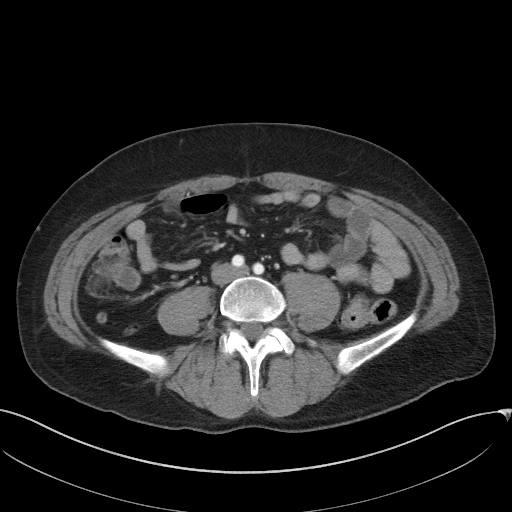
[im 53/91  soft-tissue]
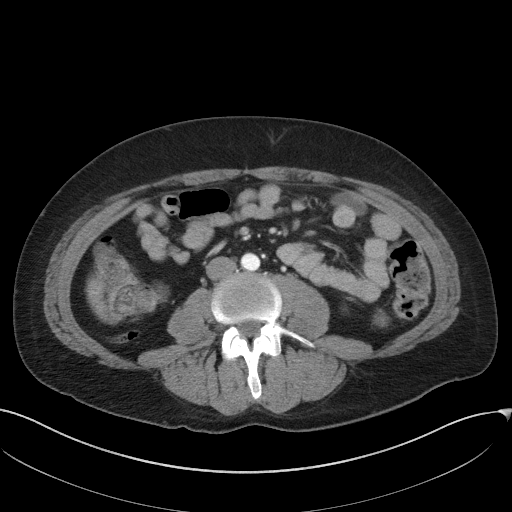
[im 57/91  soft-tissue]
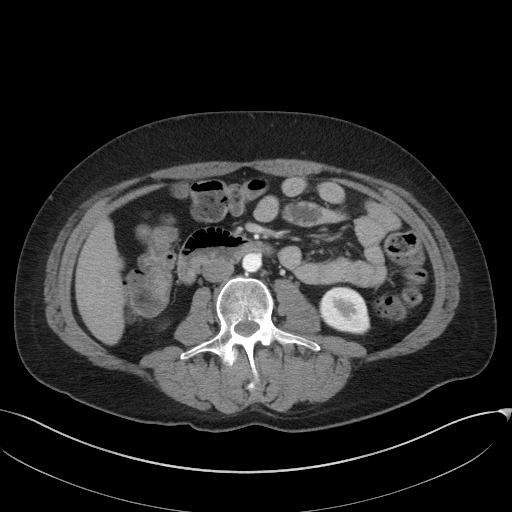
[im 57/91  bone]
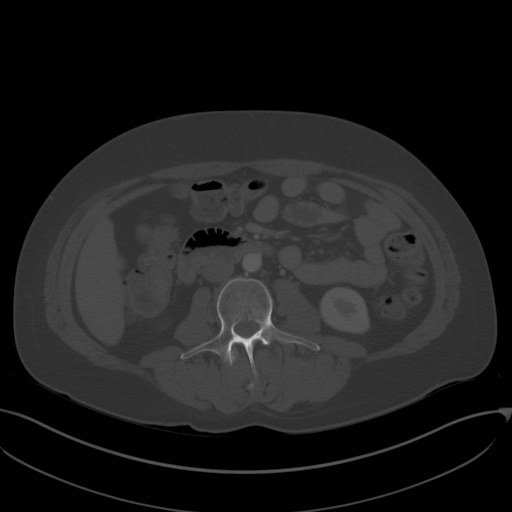
[im 67/91  soft-tissue]
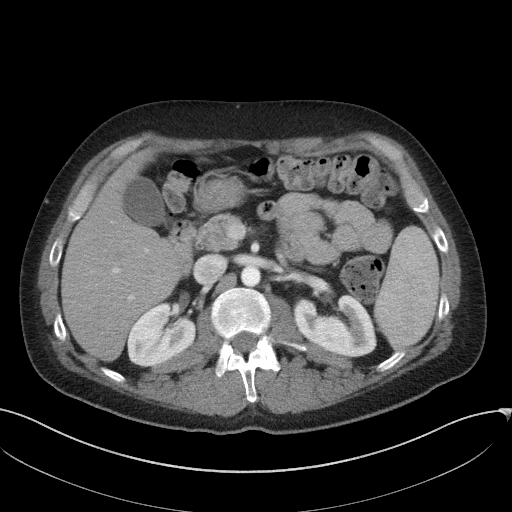
[im 72/91  soft-tissue]
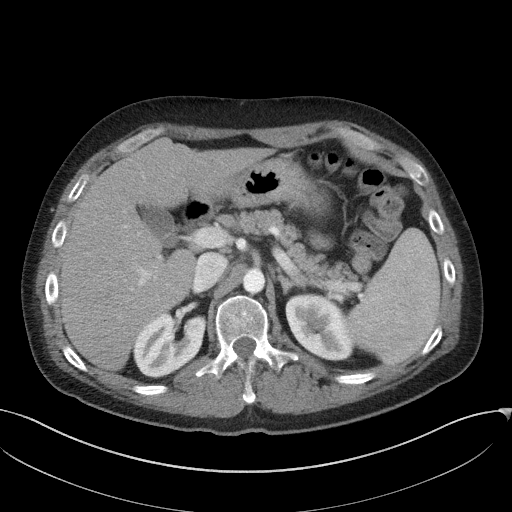
[im 76/91  soft-tissue]
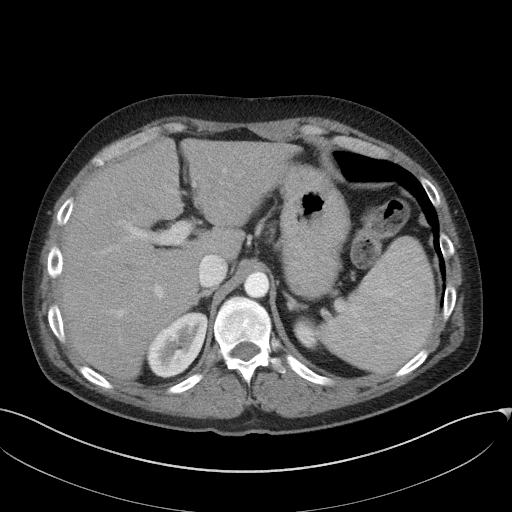
[im 86/91  soft-tissue]
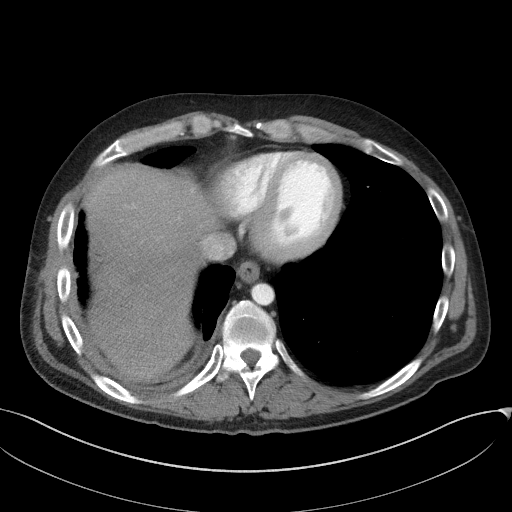

[Series 5: a/p w/ cor · coronal · 0.80mm/px · 3 of 127 slices shown]
[im 43/127  soft-tissue]
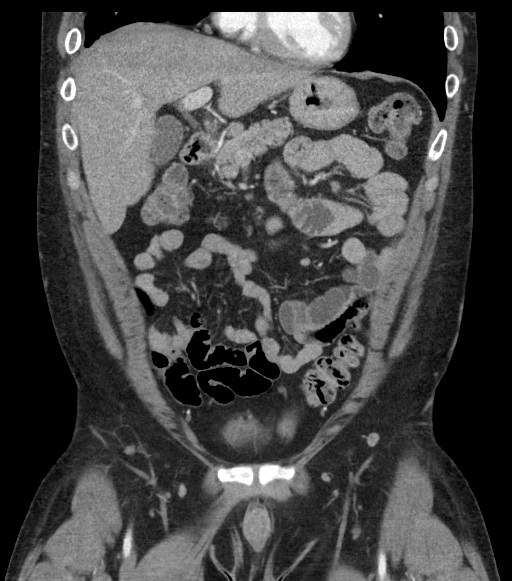
[im 57/127  soft-tissue]
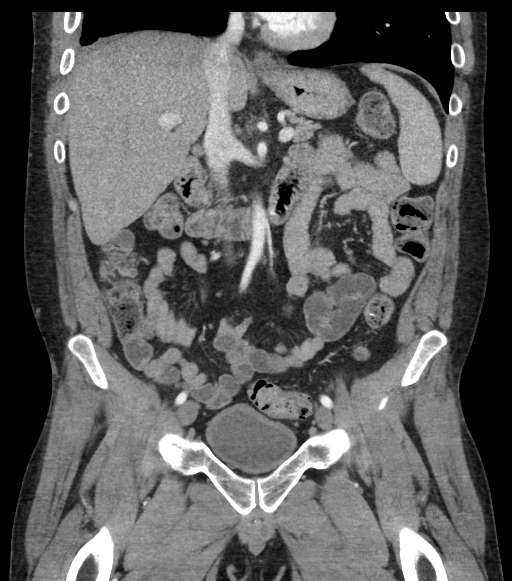
[im 71/127  soft-tissue]
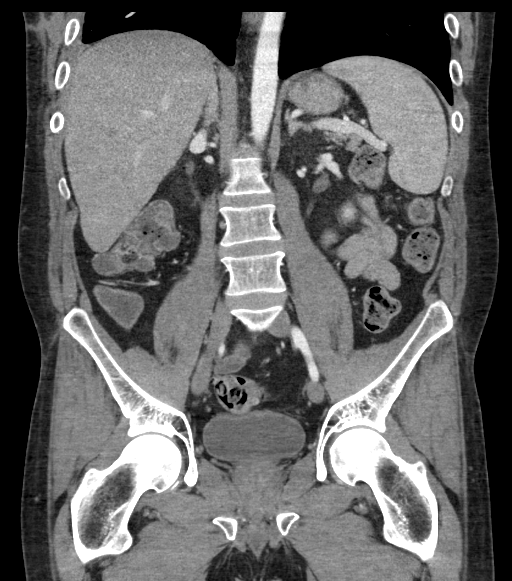

[16 of 46 positions shown; findings below may reference images not displayed]

FINDINGS: Lower chest: Small right pleural effusion. Right-sided pleural
thickening in the visualized base of the right hemithorax. Scarring
in the visualized right middle lobe.

Hepatobiliary: No cystic or solid hepatic lesions. No intra or
extrahepatic biliary ductal dilatation. Gallbladder is normal in
appearance.

Pancreas: No pancreatic mass. No pancreatic ductal dilatation. No
pancreatic or peripancreatic fluid or inflammatory changes.

Spleen: Unremarkable.

Adrenals/Urinary Tract: Bilateral adrenal glands and bilateral
kidneys are normal in appearance. No hydroureteronephrosis. Urinary
bladder is normal in appearance.

Stomach/Bowel: Normal appearance of the stomach. No pathologic
dilatation of small bowel or colon. Normal appendix (retrocecal). A
few scattered colonic diverticulae are noted, without surrounding
inflammatory changes to suggest acute diverticulitis at this time.
Th

Vascular/Lymphatic: Mild atherosclerosis in the abdominal and pelvic
vasculature, without evidence of aneurysm or dissection. No
lymphadenopathy noted in the abdomen or pelvis.

Reproductive: Prostate gland and seminal vesicles are unremarkable
in appearance.

Other: No significant volume of ascites.  No pneumoperitoneum.

Musculoskeletal: There are no aggressive appearing lytic or blastic
lesions noted in the visualized portions of the skeleton.
IMPRESSION: 1. No acute findings in the abdomen and pelvis to account for the
patient's symptoms.
2. Small right pleural effusion and small amount of right-sided
pleural thickening in the base of the right hemithorax. These
findings have significantly improved compared to prior chest CT
01/20/2016.
3. Mild colonic diverticulosis without evidence of acute
diverticulitis at this time.
4. Normal appendix.
5. Atherosclerosis.

## 2018-04-17 IMAGING — CR DG CHEST 2V
2 series · 2 of 2 positions shown · non-contrast
Comparison: 03/06/2016

CLINICAL DATA: Right upper quadrant pain. Recently seen a cone on
[REDACTED].

EXAM:
CHEST  2 VIEW

[w chest pa]
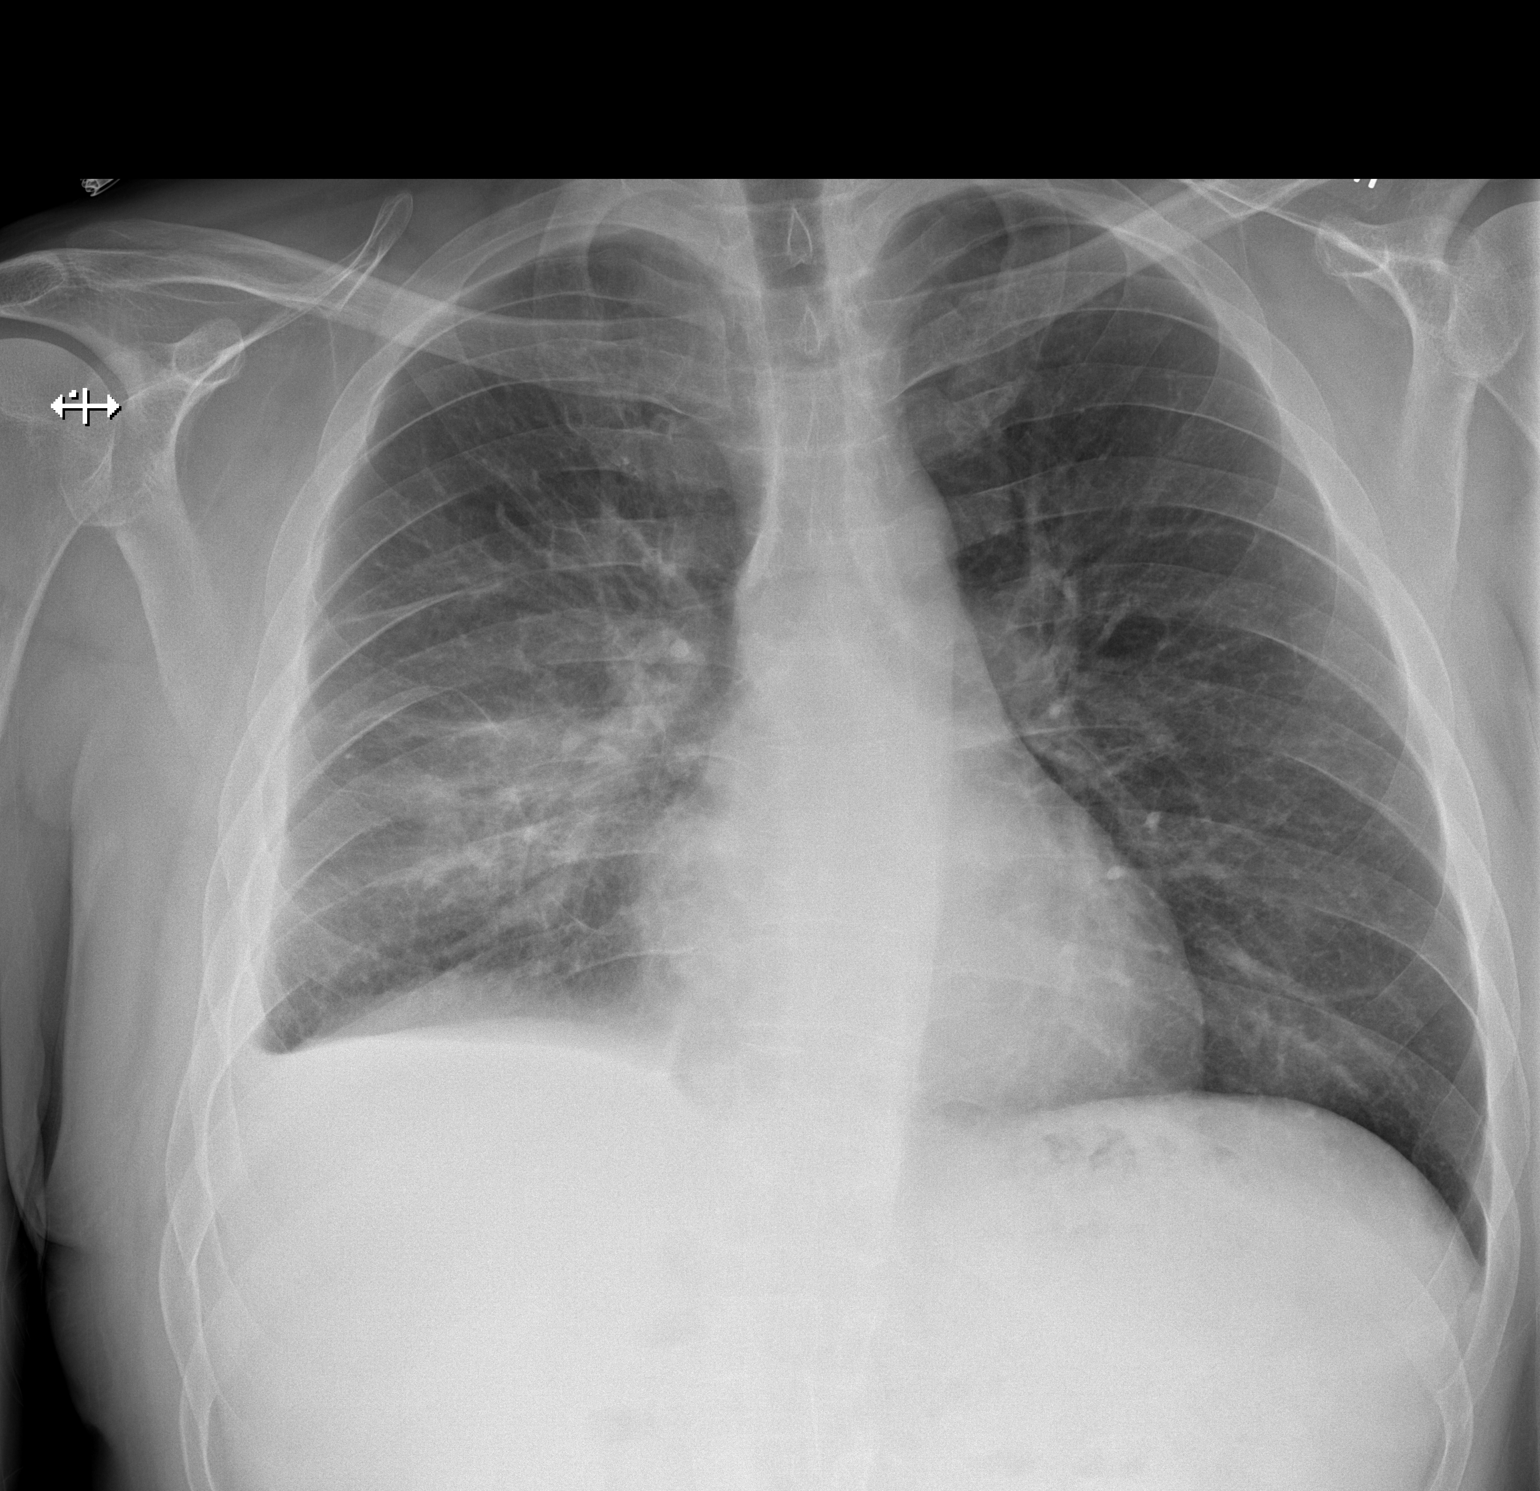

[w chest lat]
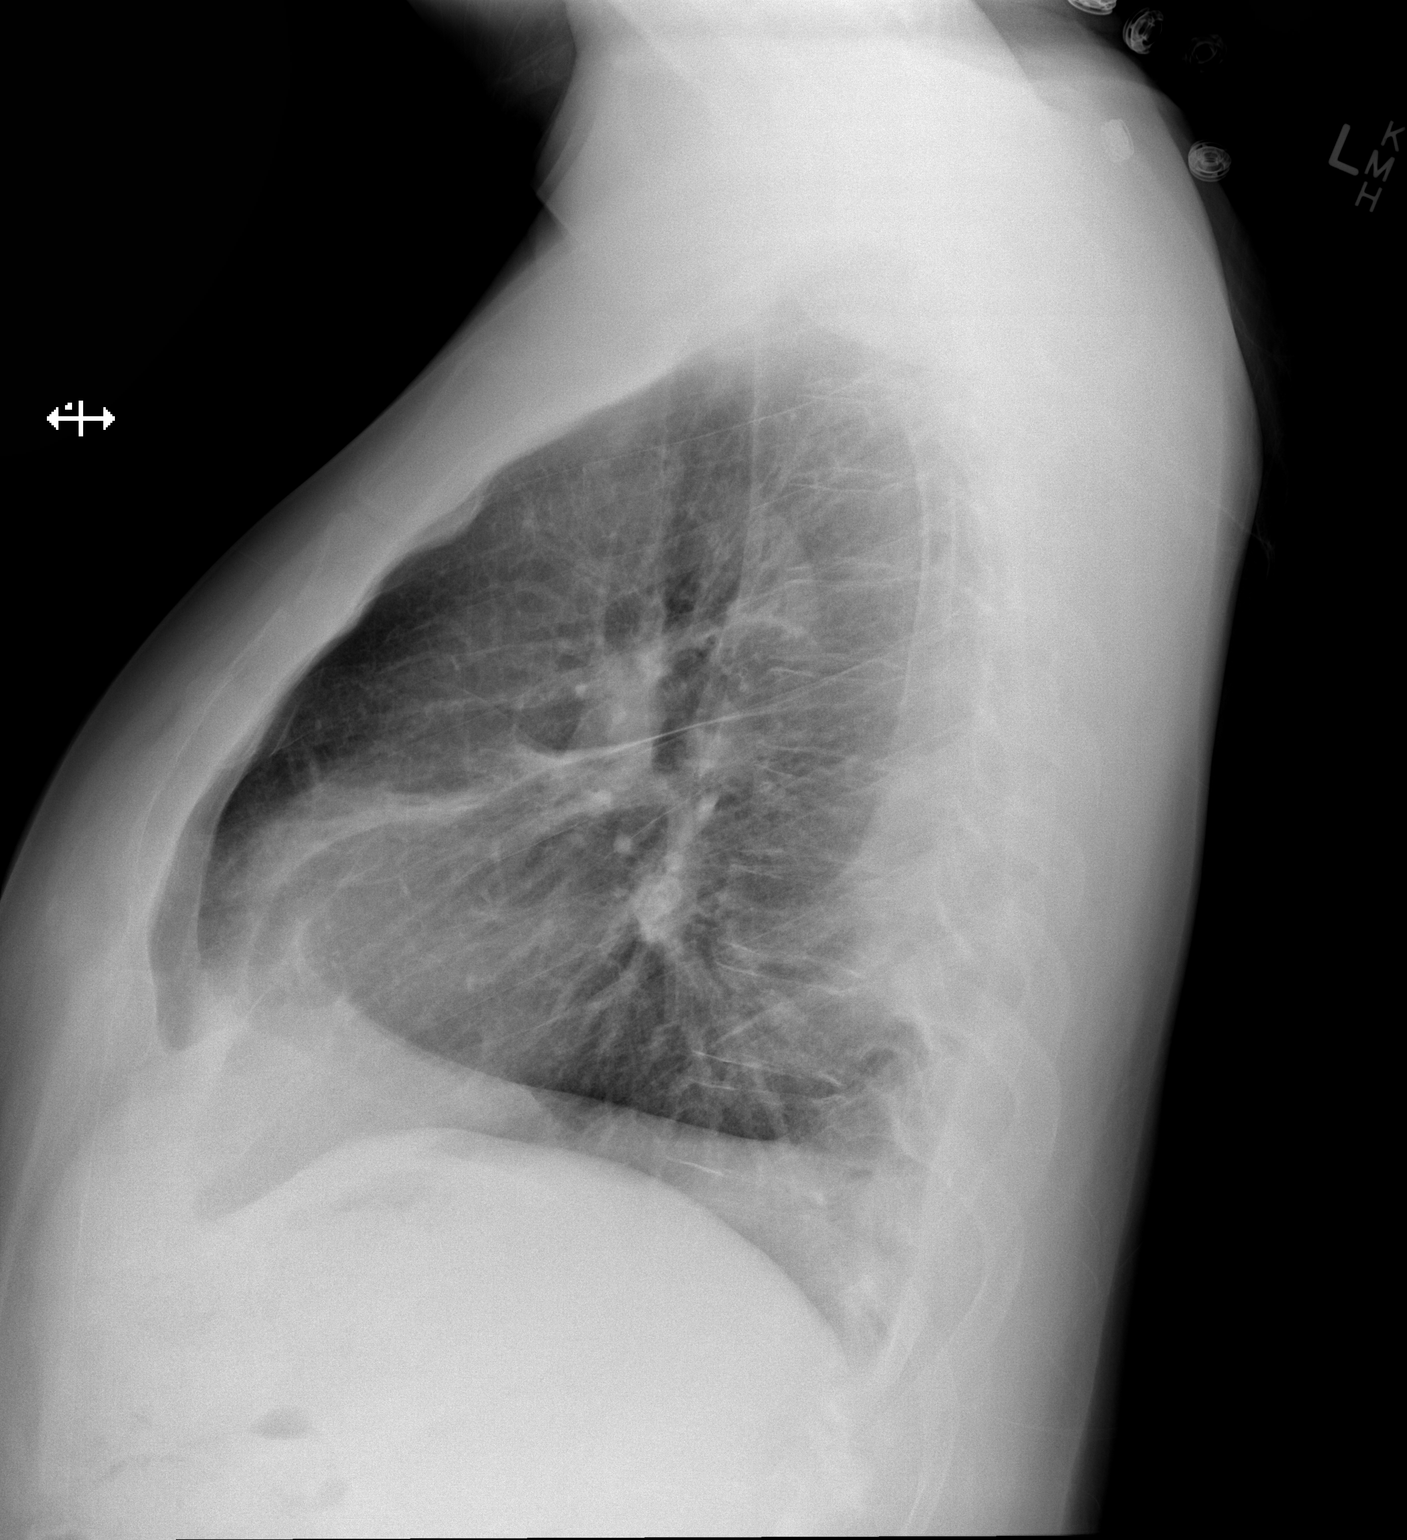

[2 of 2 positions shown; findings below may reference images not displayed]

FINDINGS: There is a small right pleural effusion. There is fluid in the right
fissures. There is extrapulmonary appearing masslike attenuation
posteriorly couple corresponding to loculated collection in the
posterior right pleural space on previous CT. Combination shadows
from fluid in the fissures and the posterior pleural collection
likely accounts for opacity in the right mid lung on the PA view.
Appearances are similar to previous chest radiograph without
significant progression. Left lung is clear. Normal heart size and
pulmonary vascularity. Mediastinal contours are intact.
IMPRESSION: Right pleural effusion with loculated fluid collection posteriorly
and fluid in the fissures on the right. Changes are similar to
previous study.

## 2018-07-14 ENCOUNTER — Emergency Department
Admission: EM | Admit: 2018-07-14 | Discharge: 2018-07-14 | Disposition: A | Discharge status: Home / Self Care | Payer: Medicare Other | Attending: Emergency Medicine | Admitting: Emergency Medicine

## 2018-07-14 ENCOUNTER — Encounter: Payer: Self-pay | Admitting: Emergency Medicine

## 2018-07-14 ENCOUNTER — Emergency Department: Payer: Medicare Other

## 2018-07-14 ENCOUNTER — Other Ambulatory Visit: Payer: Self-pay

## 2018-07-14 DIAGNOSIS — I509 Heart failure, unspecified: Secondary | ICD-10-CM | POA: Diagnosis not present

## 2018-07-14 DIAGNOSIS — Z79899 Other long term (current) drug therapy: Secondary | ICD-10-CM | POA: Insufficient documentation

## 2018-07-14 DIAGNOSIS — J45909 Unspecified asthma, uncomplicated: Secondary | ICD-10-CM | POA: Diagnosis not present

## 2018-07-14 DIAGNOSIS — F17210 Nicotine dependence, cigarettes, uncomplicated: Secondary | ICD-10-CM | POA: Diagnosis not present

## 2018-07-14 DIAGNOSIS — M25512 Pain in left shoulder: Secondary | ICD-10-CM | POA: Diagnosis not present

## 2018-07-14 DIAGNOSIS — I110 Hypertensive heart disease with heart failure: Secondary | ICD-10-CM | POA: Insufficient documentation

## 2018-07-14 MED ORDER — KETOROLAC TROMETHAMINE 30 MG/ML IJ SOLN
30.0000 mg | Freq: Once | INTRAMUSCULAR | Status: AC
  Administered 2018-07-14: 30 mg via INTRAMUSCULAR
  Filled 2018-07-14: qty 1

## 2018-07-14 MED ORDER — CYCLOBENZAPRINE HCL 10 MG PO TABS
10.0000 mg | ORAL_TABLET | Freq: Once | ORAL | Status: AC
  Administered 2018-07-14: 10 mg via ORAL
  Filled 2018-07-14: qty 1

## 2018-07-14 MED ORDER — KETOROLAC TROMETHAMINE 10 MG PO TABS: 10.0000 mg | ORAL_TABLET | Freq: Three times a day (TID) | ORAL | 0 refills | Status: DC

## 2018-07-14 NOTE — ED Notes (Signed)
Pt resides at [facility] [phone].

## 2018-07-14 NOTE — ED Notes (Signed)
Police attempting to contact group home. No one answers at phone number of group home to notify of discharge.

## 2018-07-14 NOTE — ED Notes (Signed)
Diona FantiLeslie jones with [employer] notified of pt's discharge.

## 2018-07-14 NOTE — ED Provider Notes (Signed)
[facility] Emergency Department Provider Note ____________________________________________  Time seen: 2050  I have reviewed the triage vital signs and the nursing notes.  HISTORY  Chief Complaint  Shoulder Pain  HPI [name] is a 42 y.o. male presents to the ED via EMS, for evaluation of left shoulder pain.  Patient describes 2 to 3 days of pain without preceding injury, accident, or trauma.  [facility] [place], and his legal guardian has been contacted for consent for treatment.  Patient describes taking Ultram, ibuprofen, and Flexeril without significant benefit.  He reports pain with movement and complaint of left shoulder.  He also reports some pain down the anterior upper arm.  He denies any chest pain, shortness of breath, cough, or fevers.  Past Medical History:  Diagnosis Date  . Anxiety   . Asthma   . CHF (congestive heart failure) (HCC)   . Depression   . Encephalopathy   . GERD (gastroesophageal reflux disease)   . Hypertension   . Hyponatremia 06/2016  . Renal disorder    kidney injury  . Seizures [facility](HCC)     Patient Active Problem List   Diagnosis Date Noted  . Cellulitis of left lower extremity   . Cellulitis of left foot 03/07/2018  . COPD (chronic obstructive pulmonary disease) (HCC) 05/03/2017  . Hyponatremia 06/27/2016  . GERD (gastroesophageal reflux disease) 11/11/2015  . Borderline personality disorder (HCC) 11/11/2015  . Alcohol use disorder, severe, in controlled environment (HCC) 11/11/2015  . 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 Tobacco abuse 11/05/2013    Past Surgical History:  Procedure Laterality Date  . FINGER SURGERY Left    5th digit  . PLEURAL EFFUSION DRAINAGE Right 01/02/2016   Procedure: DRAINAGE OF PLEURAL EFFUSION;  Surgeon: Loreli SlotSteven C Hendrickson, MD;  [facility];   Service: Thoracic;  Laterality: Right;  Marland Kitchen. VIDEO ASSISTED THORACOSCOPY (VATS)/DECORTICATION Right 01/02/2016   Procedure: VIDEO ASSISTED THORACOSCOPY (VATS)/DECORTICATION;  Surgeon: Loreli SlotSteven C Hendrickson, MD;  [facility];  Service: Thoracic;  Laterality: Right;  Marland Kitchen. VIDEO BRONCHOSCOPY N/A 01/02/2016   Procedure: VIDEO BRONCHOSCOPY;  Surgeon: Loreli SlotSteven C Hendrickson, MD;  [facility];  Service: Thoracic;  Laterality: N/A;    Prior to Admission medications   Medication Sig Start Date End Date Taking? Authorizing Provider  albuterol (PROVENTIL HFA;VENTOLIN HFA) 108 (90 Base) MCG/ACT inhaler Inhale 1-2 puffs into the lungs every 6 (six) hours as needed for wheezing or shortness of breath. 02/17/18   Zadie RhineWickline, Donald, MD  Cholecalciferol (VITAMIN D3) 2000 units TABS Take 1 tablet by mouth daily.    [provider]  cyclobenzaprine (FLEXERIL) 10 MG tablet Take 1 tablet (10 mg total) by mouth every 12 (twelve) hours as needed for muscle spasms. 03/08/18   Vassie LollMadera, Carlos, MD  diclofenac (VOLTAREN) 75 MG EC tablet Take 1 tablet by mouth twice a day for 3 days; then use q12hr as needd for pain. 03/08/18   Vassie LollMadera, Carlos, MD  disulfiram (ANTABUSE) 250 MG tablet Take 250 mg by mouth daily.    [provider]  divalproex (DEPAKOTE ER) 500 MG 24 hr tablet Take 4 tablets (2,000 mg total) by mouth at bedtime. 11/13/15   Jimmy FootmanHernandez-Gonzalez, Andrea, MD  escitalopram (LEXAPRO) 20 MG tablet Take 20 mg by mouth every morning.    [provider]  esomeprazole (NEXIUM) 40 MG capsule Take 40 mg by mouth daily at  12 noon.    [provider]  feeding supplement, ENSURE ENLIVE, (ENSURE ENLIVE) LIQD Take 237 mLs by mouth 2 (two) times daily between meals. 03/08/18   Vassie Loll, MD  ferrous sulfate 325 (65 FE) MG tablet Take 1 tablet (325 mg total) by mouth 2 (two) times daily with a meal. 01/11/16   Regalado, Belkys A, MD  Fluticasone-Salmeterol (ADVAIR) 250-50 MCG/DOSE AEPB Inhale 1 puff  into the lungs 2 (two) times daily.    [provider]  furosemide (LASIX) 20 MG tablet Take 1 tablet (20 mg total) by mouth daily. 03/08/18   Vassie Loll, MD  gabapentin (NEURONTIN) 300 MG capsule Take 300 mg by mouth 3 (three) times daily.    [provider]  hydrOXYzine (ATARAX/VISTARIL) 50 MG tablet Take 1 tablet (50 mg total) by mouth 3 (three) times daily. 05/07/17   Erick Blinks, MD  ketorolac (TORADOL) 10 MG tablet Take 1 tablet (10 mg total) by mouth every 8 (eight) hours. 07/14/18   Lakara Weiland, Charlesetta Ivory, PA-C  lactulose (CHRONULAC) 10 GM/15ML solution Take 45 mLs (30 g total) by mouth 2 (two) times daily. 05/07/17   Erick Blinks, MD  Melatonin 5 MG TABS Take 10 mg by mouth at bedtime.    [provider]  Multiple Vitamins-Minerals (THEREMS-M) TABS Take 1 tablet by mouth daily.    [provider]  primidone (MYSOLINE) 250 MG tablet Take 250 mg by mouth 3 (three) times daily.    [provider]  primidone (MYSOLINE) 50 MG tablet Take 50 mg by mouth 3 (three) times daily. Take with 250mg  with 50mg (300 mg tid)    [provider]  QUEtiapine (SEROQUEL) 200 MG tablet Take 200 mg by mouth at bedtime.    [provider]    Allergies Penicillins  Family History  Problem Relation Age of Onset  . CAD Mother   . Diabetes Mellitus II Father   . Prostate cancer Paternal Grandfather     Social History Social History   Tobacco Use  . Smoking status: Current Every Day Smoker    Packs/day: 0.00    Years: 20.00    Pack years: 0.00    Types: Pipe  . Smokeless tobacco: Never Used  Substance Use Topics  . Alcohol use: No    Comment: pt states he no longer drinks  . Drug use: No    Review of Systems  Constitutional: Negative for fever. Cardiovascular: Negative for chest pain. Respiratory: Negative for shortness of breath. Musculoskeletal: Negative for back pain. Left shoulder pain  Skin: Negative for  rash. Neurological: Negative for headaches, focal weakness or numbness. ____________________________________________  PHYSICAL EXAM:  VITAL SIGNS: ED Triage Vitals  Enc Vitals Group     BP 07/14/18 1850 (!) 149/47     Pulse Rate 07/14/18 1850 77     Resp 07/14/18 1850 20     Temp 07/14/18 1850 98 F (36.7 C)     Temp Source 07/14/18 1850 Oral     SpO2 07/14/18 1850 97 %     Weight 07/14/18 1851 170 lb (77.1 kg)     Height 07/14/18 1851 5\' 11"  (1.803 m)     Head Circumference --      Peak Flow --      Pain Score 07/14/18 1850 9     Pain Loc --      Pain Edu? --      Excl. in GC? --     Constitutional: Alert and oriented.  Well appearing and in no distress. Head: Normocephalic and atraumatic. Eyes: Conjunctivae are normal. Normal extraocular movements Neck: Supple. Normal ROM without crepitus. No midline tenderness noted.  Cardiovascular: Normal rate, regular rhythm. Normal distal pulses. Respiratory: Normal respiratory effort. No wheezes/rales/rhonchi. Musculoskeletal: Left shoulder without any obvious deformity, dislocation, or sulcus sign.  Patient with full active range of motion on exam.  No rotator cuff deficit is appreciated.  Nontender with normal range of motion in all extremities.  Neurologic: Nerves II through XII grossly intact.  Normal UE DTRs bilaterally.  Normal gait without ataxia. Normal speech and language. No gross focal neurologic deficits are appreciated. Skin:  Skin is warm, dry and intact. No rash noted. Psychiatric: Mood and affect are normal. Patient exhibits appropriate insight and judgment. ____________________________________________   RADIOLOGY  Left Shoulder Negative ____________________________________________  PROCEDURES  Procedures Toradol 30 mg IM Flexeril 10 mg PO ____________________________________________  INITIAL IMPRESSION / ASSESSMENT AND PLAN / ED COURSE  Patient with ED evaluation of left shoulder pain x 3 days. His exam is  essentially normal. No indication in internal derangement. His x-ray is negative for fracture or dislocation. He is discharged with anti-inflammatories to dose as directed.  ____________________________________________  FINAL CLINICAL IMPRESSION(S) / ED DIAGNOSES  Final diagnoses:  Acute pain of left shoulder      Karmen Stabs, Charlesetta Ivory, PA-C 07/16/18 1037    Minna Antis, MD 07/18/18 1513

## 2018-07-14 NOTE — Discharge Instructions (Addendum)
Your exam is normal at this time. Take the pain medicine as directed. Take you home meds as prescribed. See your provider for ongoing symptoms.

## 2018-07-14 NOTE — ED Notes (Signed)
Pt states no pain relief with medication. Pt requesting additional "stronger" pain medication and meal tray.

## 2018-07-14 NOTE — ED Triage Notes (Signed)
L shoulder pain which increases with movement x 3 days. No fall or injury at onset. From [facility] on [address] in [place]. States has legal guardian Building surveyorKailee Morrow-Jennings. Phone number in folder called and gives direction to contact c com at [phone] and ask for on call adult services social worker. I have called that number and given my number for the on call social worker to call back.

## 2018-07-14 NOTE — ED Notes (Signed)
Report to noel, rn.  

## 2018-07-14 NOTE — ED Notes (Signed)
No peripheral IV placed this visit.   Discharge instructions reviewed with patient. Questions fielded by this RN. Patient verbalizes understanding of instructions. [facility] No acute distress noted at time of discharge.

## 2018-07-14 NOTE — ED Notes (Signed)
Call leslie jones with [employer] on discharge at [phone]

## 2018-08-02 IMAGING — DX DG CHEST 2V
2 series · 2 of 2 positions shown · non-contrast
Comparison: 03/12/2016

CLINICAL DATA: Wheezing.  Asthma and seizures.

EXAM:
CHEST  2 VIEW

[chest pa]
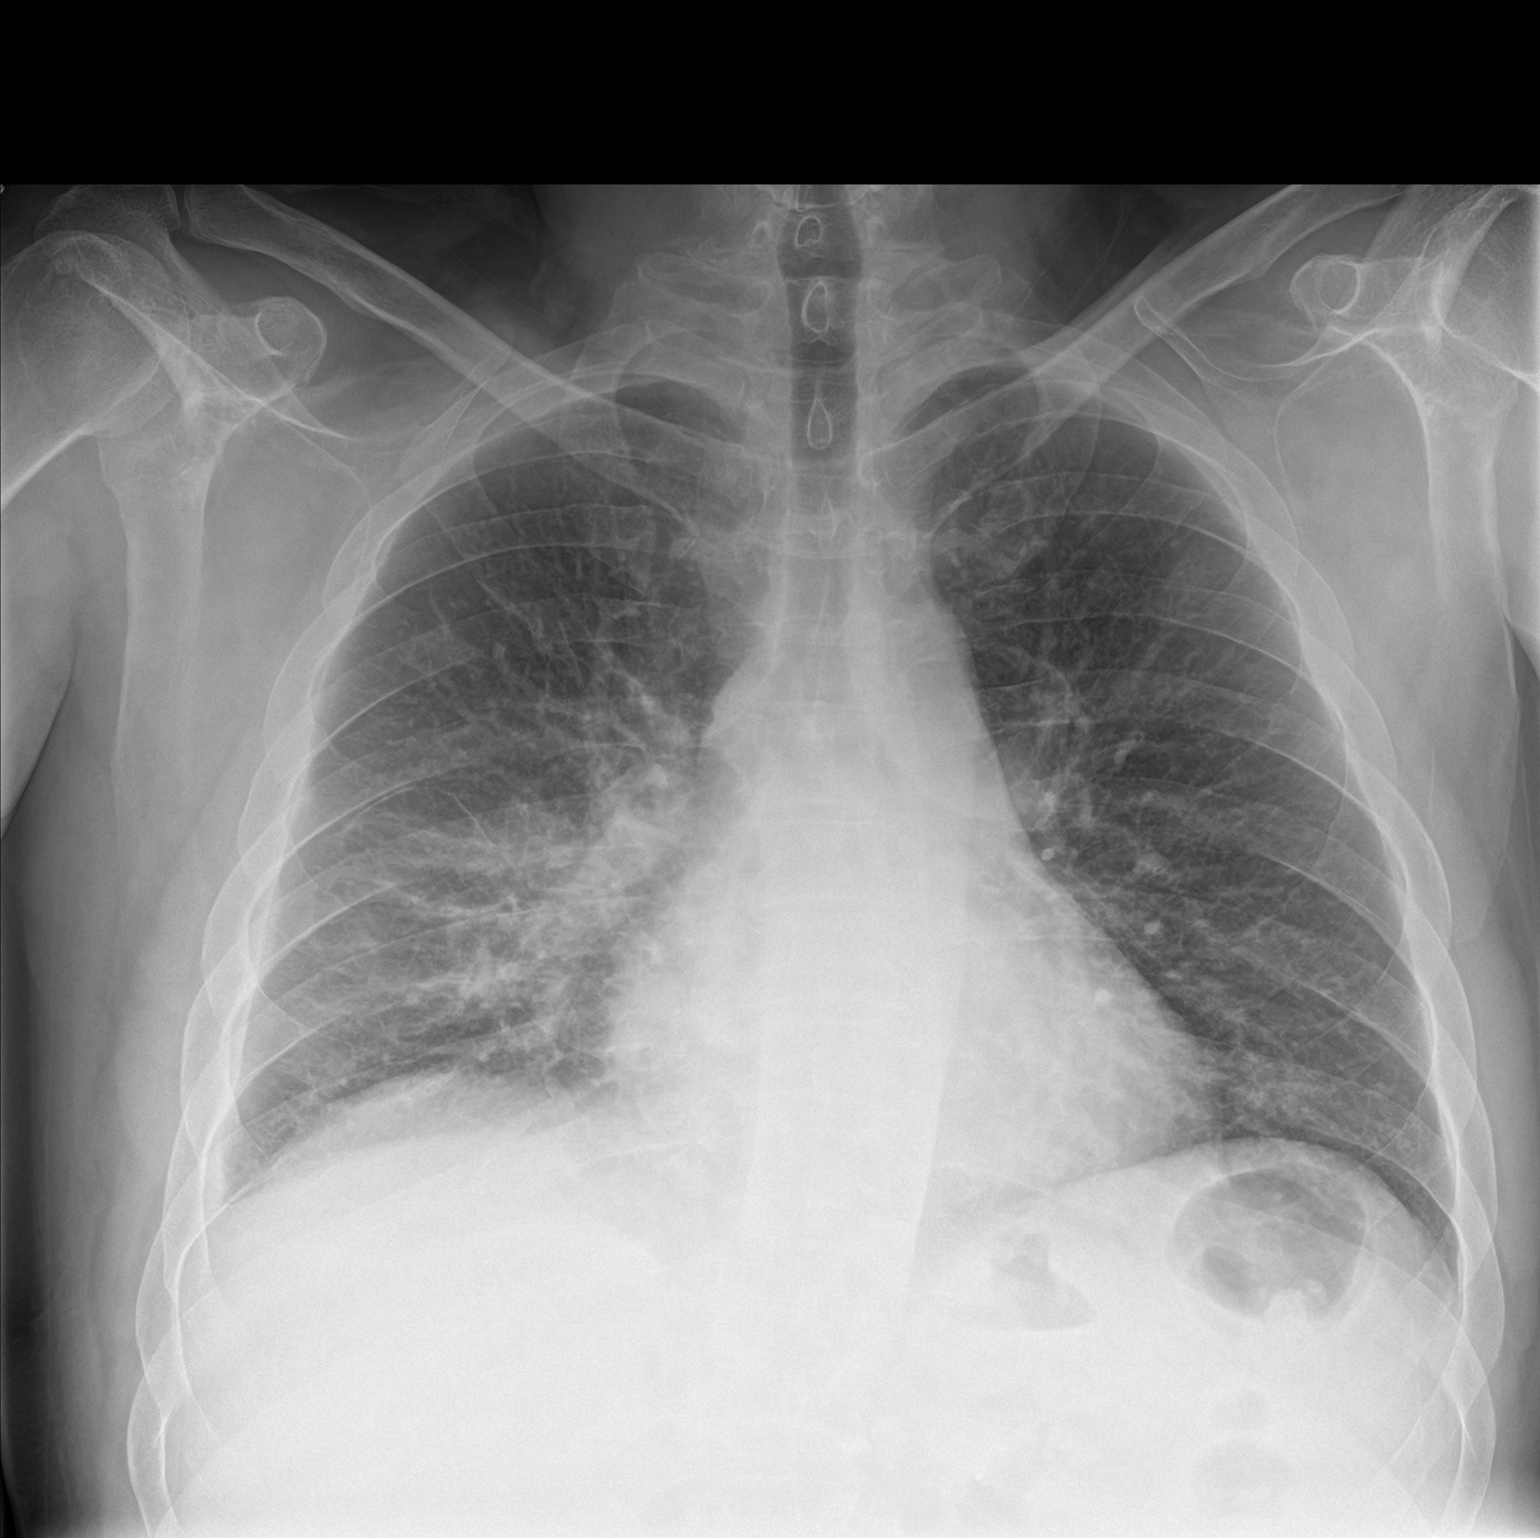

[chest lat]
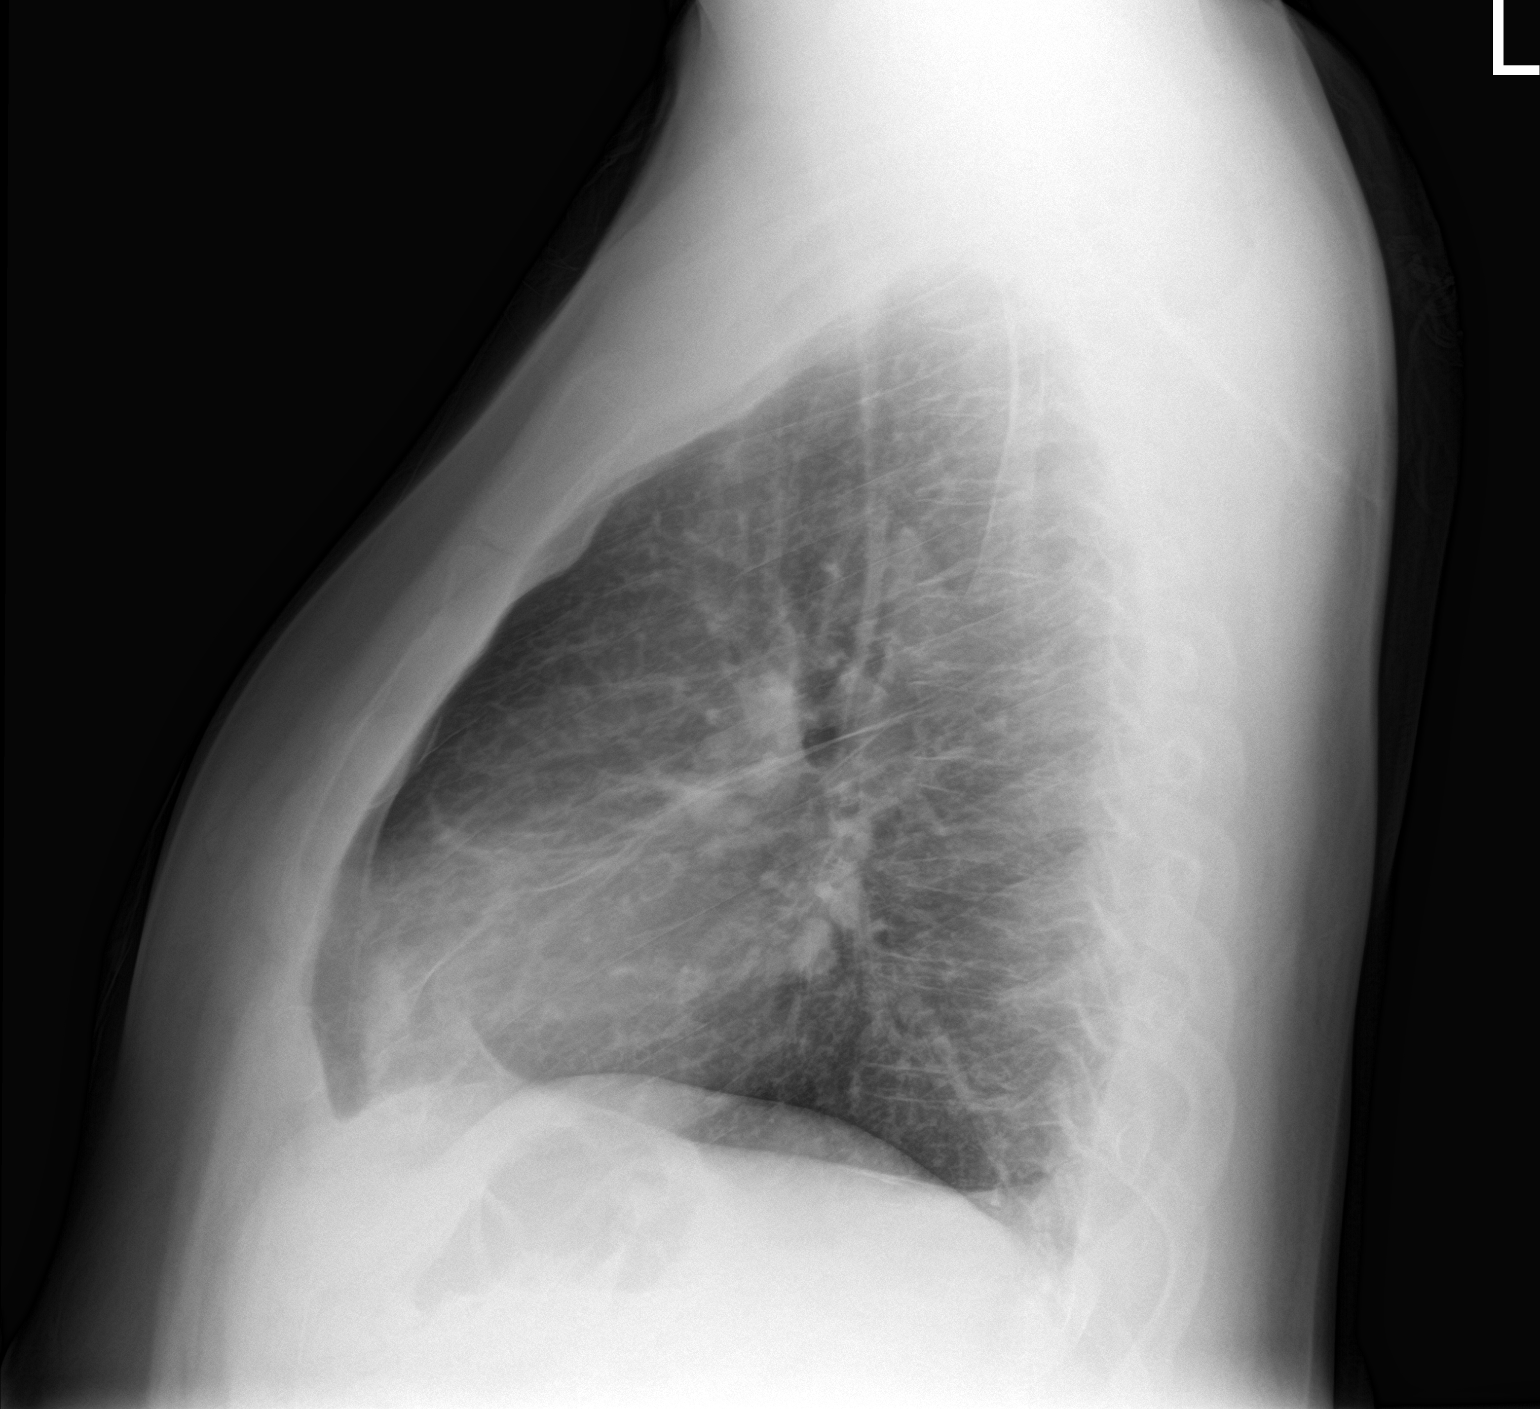

[2 of 2 positions shown; findings below may reference images not displayed]

FINDINGS: Normal heart size. Decrease in volume of the right pleural effusion.
Persistent airspace opacity within the right middle lobe. Pulmonary
vascular congestion persists.
IMPRESSION: 1. Decrease in right pleural effusion. Persistent pulmonary vascular
congestion.
2. Persistent right middle lobe airspace consolidation/atelectasis.

## 2018-08-02 IMAGING — CT CT RENAL STONE PROTOCOL
1 of 4 series · 6 of 46 positions shown, 11 images · non-contrast
Comparison: CT abdomen and pelvis March 07, 2015

CLINICAL DATA: LEFT flank pain, burning with urination for 1 day.

EXAM:
CT ABDOMEN AND PELVIS WITHOUT CONTRAST
TECHNIQUE: Multidetector CT imaging of the abdomen and pelvis was performed
following the standard protocol without IV contrast.

[Series 206: lung · axial · 0.82mm/px · z∈[-349,-307]mm · 6 of 31 slices shown, 11 images]
[im 5/31  soft-tissue]
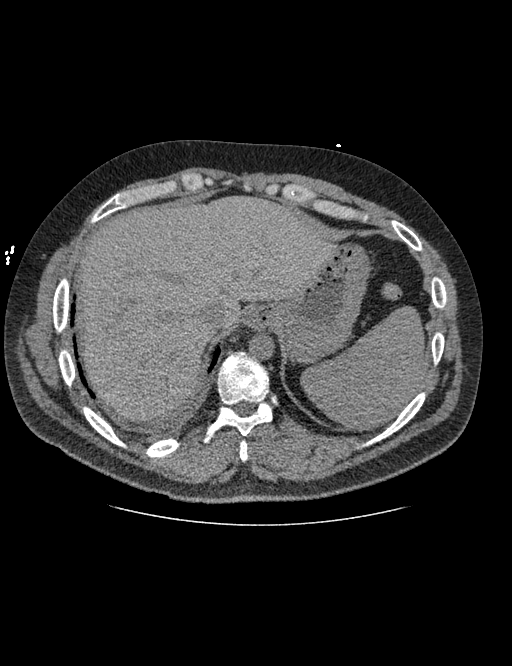
[im 5/31  bone]
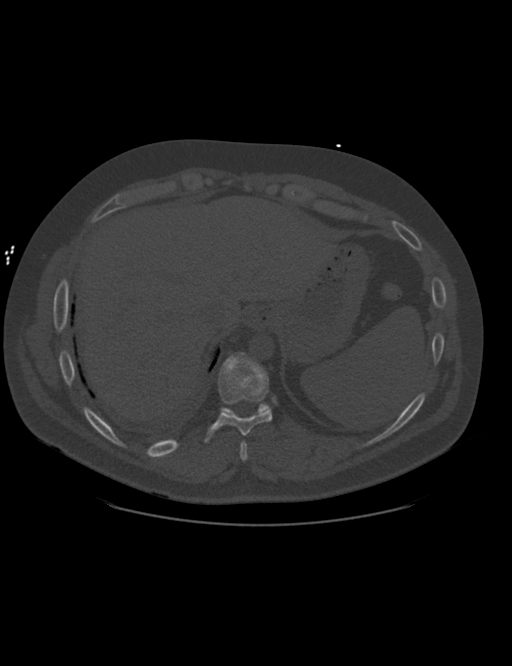
[im 9/31  soft-tissue]
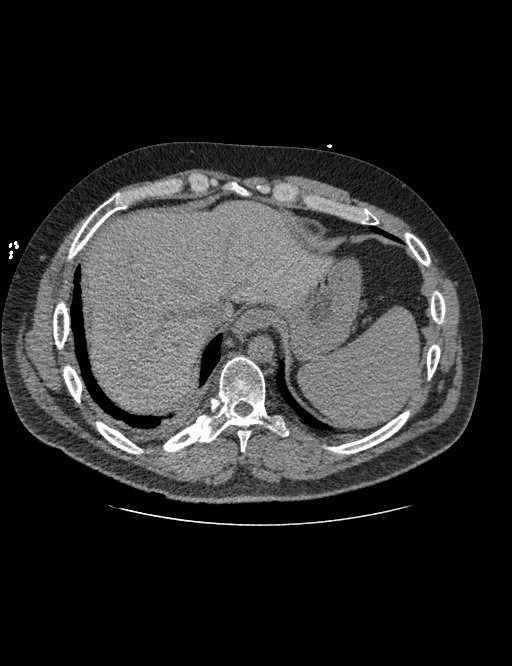
[im 13/31  soft-tissue]
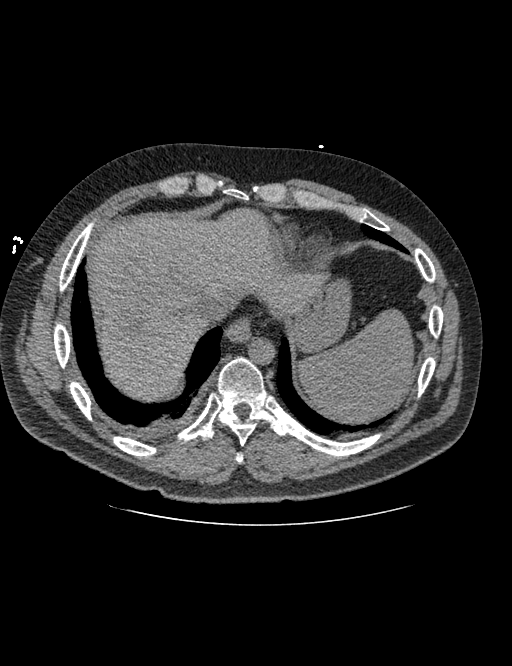
[im 13/31  lung]
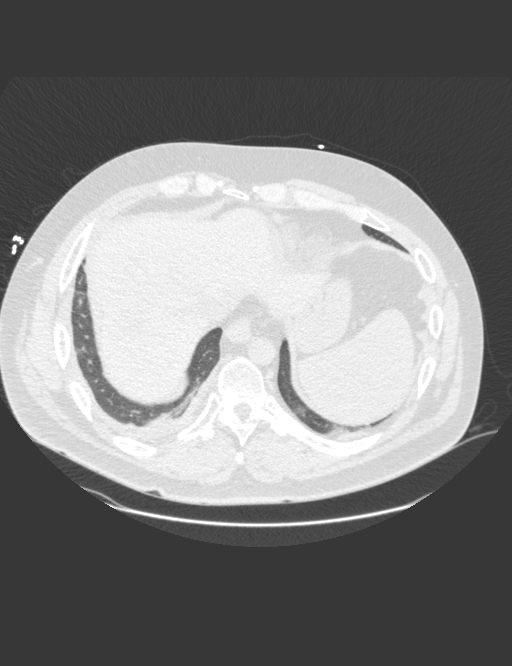
[im 18/31  soft-tissue]
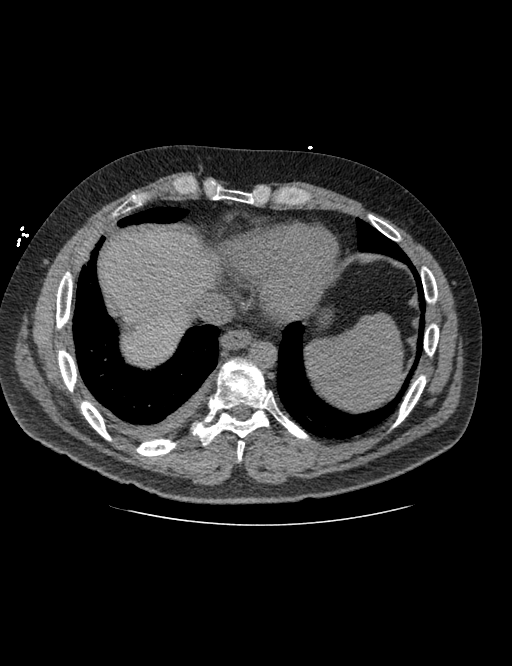
[im 18/31  lung]
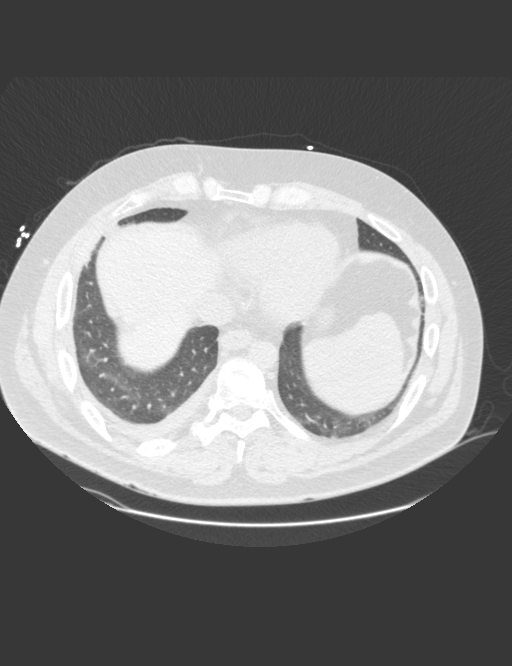
[im 22/31  soft-tissue]
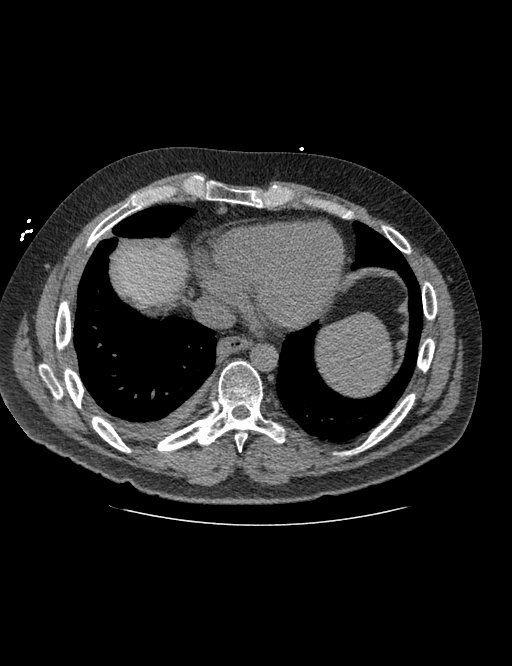
[im 22/31  lung]
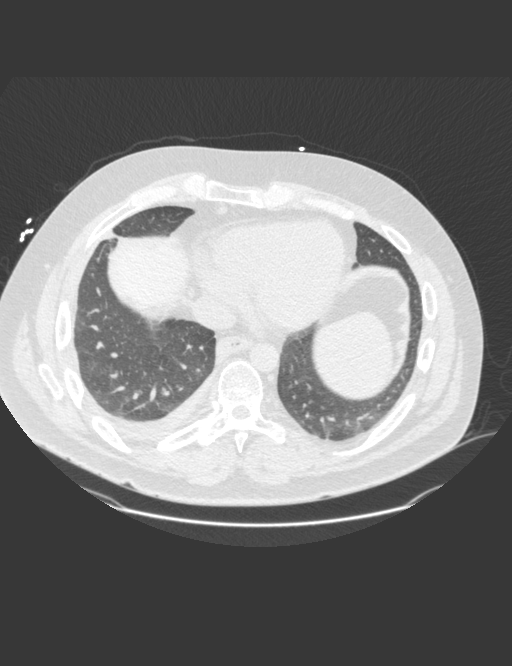
[im 26/31  soft-tissue]
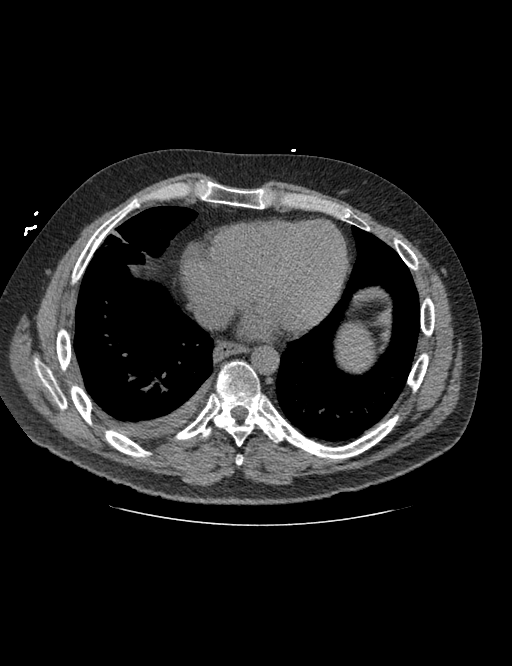
[im 26/31  lung]
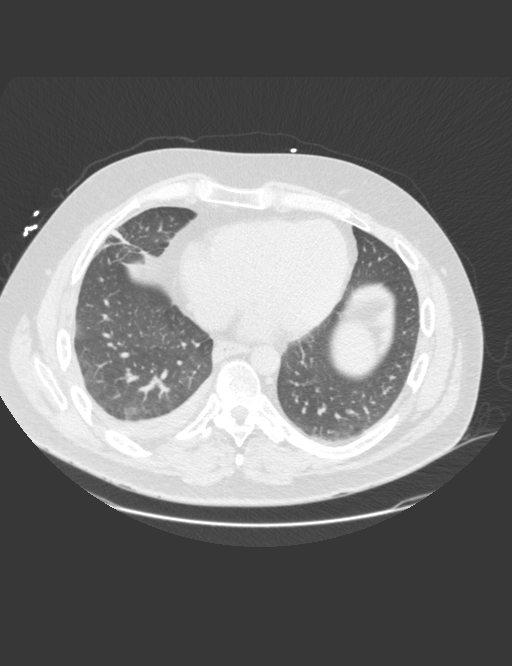

[6 of 46 positions shown; findings below may reference images not displayed]

FINDINGS: LUNG BASES: Stable small RIGHT pleural effusion/pleural thickening,
status post RIGHT VATS. RIGHT middle lobe scarring. Minimal
bronchial wall thickening cyst partially imaged.

KIDNEYS/BLADDER: Kidneys are orthotopic, demonstrating normal size
and morphology. No nephrolithiasis, hydronephrosis; limited
assessment for renal masses on this nonenhanced examination. The
unopacified ureters are normal in course and caliber. Urinary
bladder is partially distended and unremarkable.

SOLID ORGANS: The liver, spleen, gallbladder, pancreas and adrenal
glands are unremarkable for this non-contrast examination.

GASTROINTESTINAL TRACT: The stomach, small and large bowel are
normal in course and caliber without inflammatory changes, the
sensitivity may be decreased by lack of enteric contrast. Mild
amount of retained large bowel stool. Moderate sigmoid
diverticulosis. Normal appendix.

PERITONEUM/RETROPERITONEUM: Aortoiliac vessels are normal in course
and caliber, trace calcific atherosclerosis. No lymphadenopathy by
CT size criteria. Prostate size is normal. No intraperitoneal free
fluid nor free air.

SOFT TISSUES/ OSSEOUS STRUCTURES: Nonsuspicious. Mild bilateral hip
osteoarthrosis. Severe L5-S1 disc height loss, vacuum disc and
marginal spurring results in severe RIGHT L5-S1 neural foraminal
narrowing.
IMPRESSION: No urolithiasis, obstructive uropathy nor acute intra-abdominal/
pelvic process.

Moderate sigmoid diverticulosis.

## 2018-08-11 IMAGING — DX DG CHEST 2V
2 series · 2 of 2 positions shown · non-contrast
Comparison: Prior radiograph from 06/27/2016.

CLINICAL DATA: Initial evaluation for possible recent pneumonia.
Now with persistent productive cough, sore throat, shortness of
breath, and chest heaviness.

EXAM:
CHEST  2 VIEW

[chest pa]
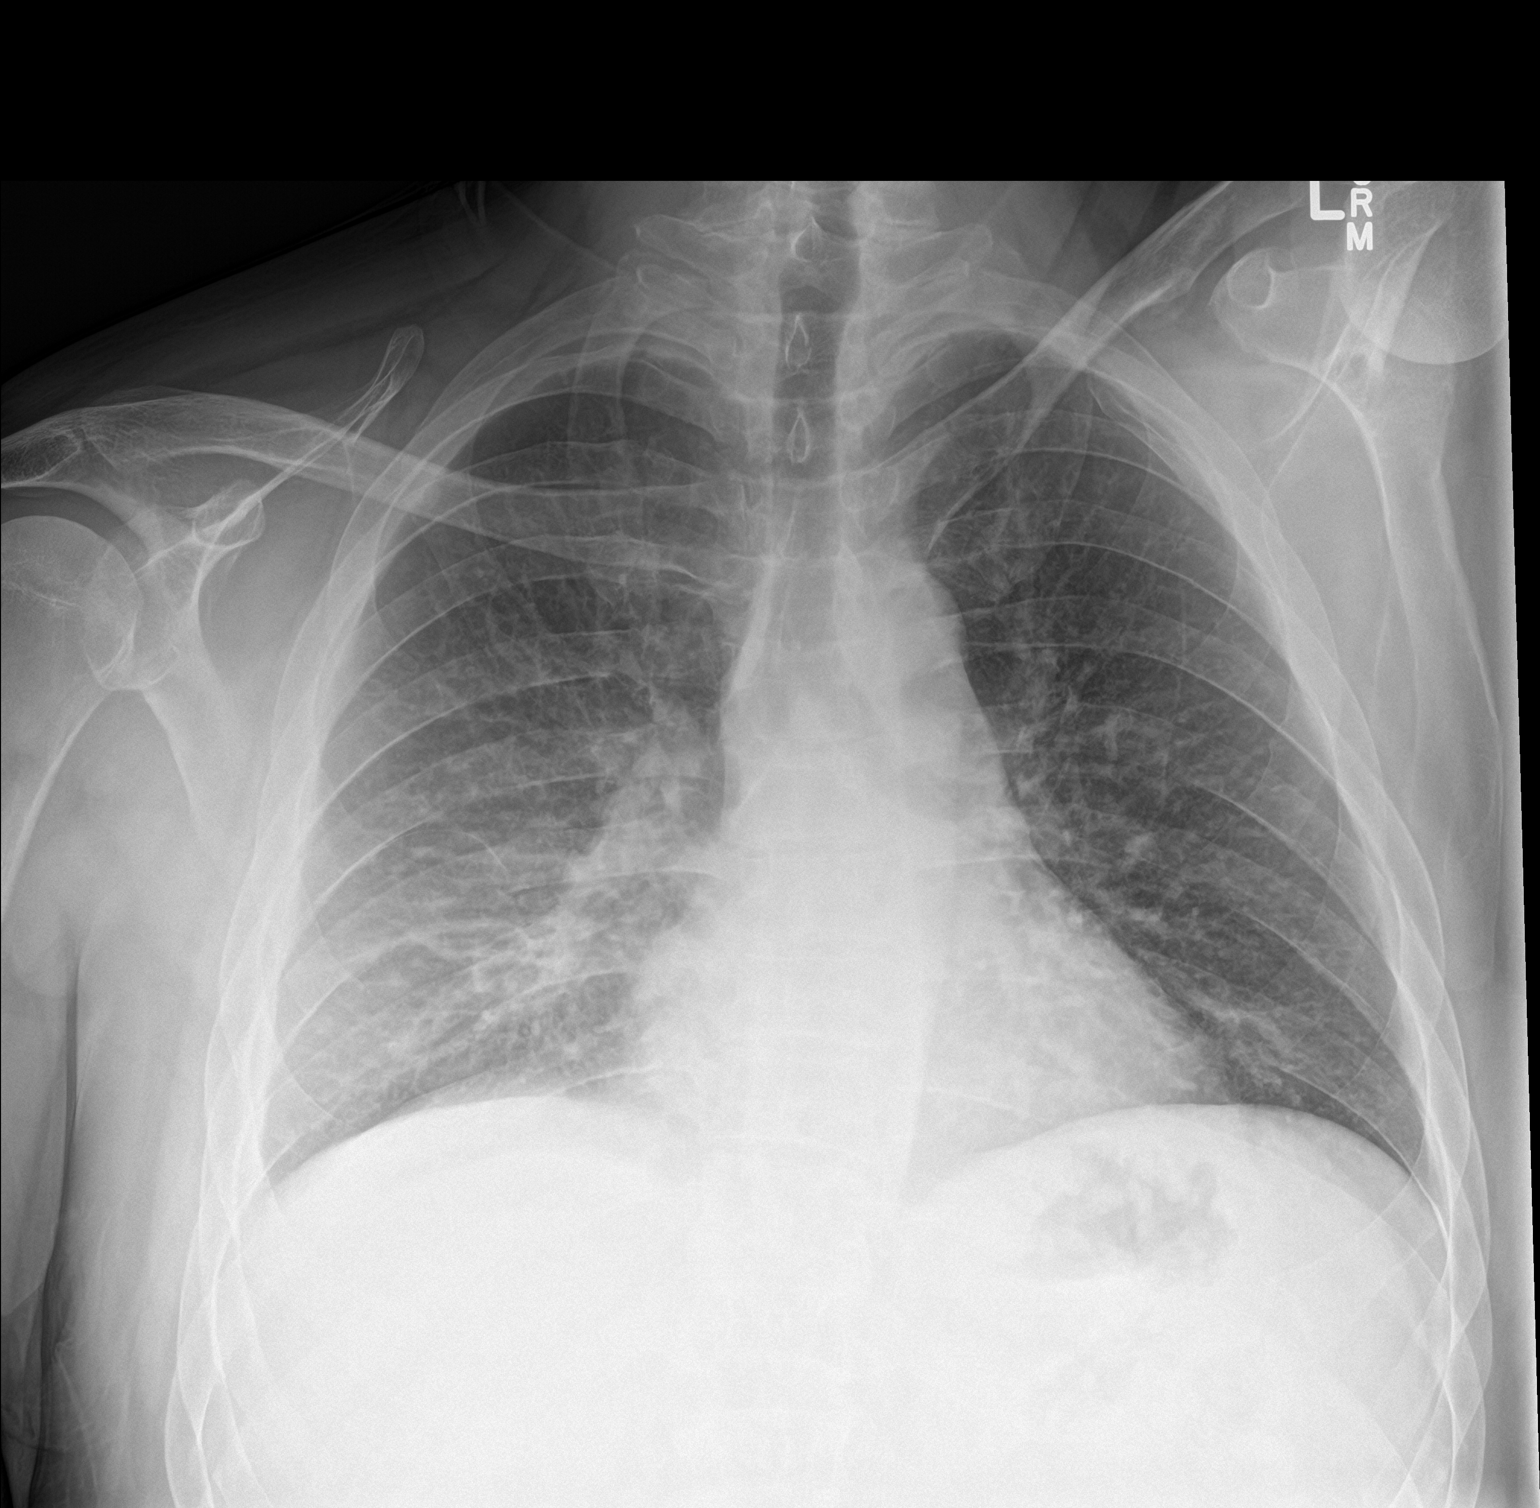

[chest lat]
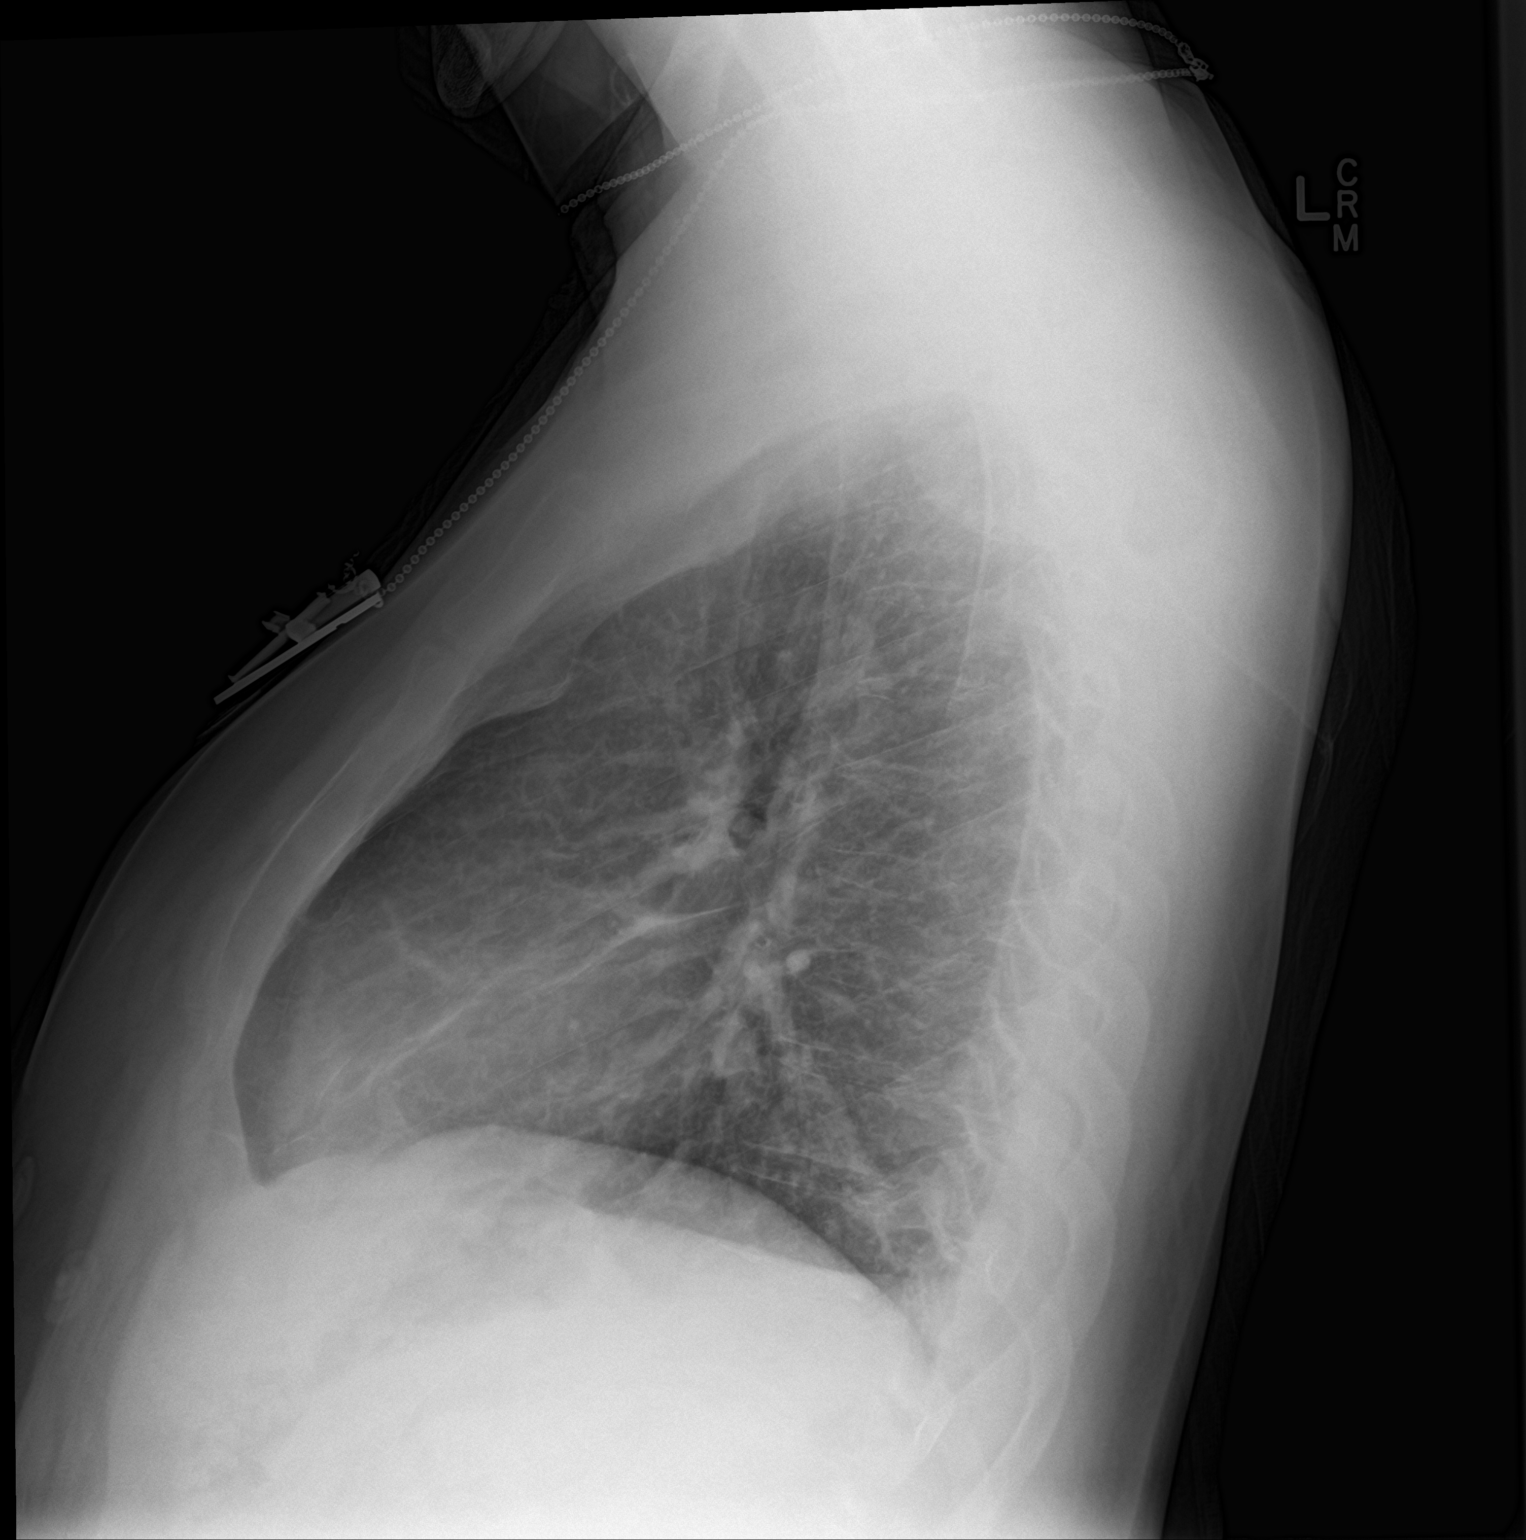

[2 of 2 positions shown; findings below may reference images not displayed]

FINDINGS: Cardiac and mediastinal silhouettes are stable in size and contour,
and remain within normal limits.

Lungs are normally inflated. Scattered vascular congestion is
similar to previous. No overt pulmonary edema. There is persistent
asymmetric hazy opacity within the right middle lobe, the the which
may reflect atelectasis and/ or infiltrate. No other focal airspace
disease. No pneumothorax. Probable persistent small right pleural
effusion.

Osseous structures unchanged.
IMPRESSION: 1. Persistent hazy right middle lobe opacity, which may reflect
infiltrate and/or atelectasis.
2. Similar pulmonary vascular congestion without overt pulmonary
edema.
3. Persistent small right pleural effusion.

## 2018-11-21 ENCOUNTER — Emergency Department: Payer: Medicare HMO

## 2018-11-21 ENCOUNTER — Emergency Department
Admission: EM | Admit: 2018-11-21 | Discharge: 2018-11-21 | Disposition: A | Discharge status: Home / Self Care | Payer: Medicare HMO | Attending: Emergency Medicine | Admitting: Emergency Medicine

## 2018-11-21 ENCOUNTER — Other Ambulatory Visit: Payer: Self-pay

## 2018-11-21 DIAGNOSIS — F17290 Nicotine dependence, other tobacco product, uncomplicated: Secondary | ICD-10-CM | POA: Diagnosis not present

## 2018-11-21 DIAGNOSIS — I509 Heart failure, unspecified: Secondary | ICD-10-CM | POA: Insufficient documentation

## 2018-11-21 DIAGNOSIS — R0602 Shortness of breath: Secondary | ICD-10-CM | POA: Diagnosis not present

## 2018-11-21 DIAGNOSIS — Z79899 Other long term (current) drug therapy: Secondary | ICD-10-CM | POA: Diagnosis not present

## 2018-11-21 DIAGNOSIS — I110 Hypertensive heart disease with heart failure: Secondary | ICD-10-CM | POA: Diagnosis not present

## 2018-11-21 DIAGNOSIS — J449 Chronic obstructive pulmonary disease, unspecified: Secondary | ICD-10-CM | POA: Insufficient documentation

## 2018-11-21 LAB — CBC
HEMATOCRIT: 46.1 % (ref 39.0–52.0)
Hemoglobin: 16.1 g/dL (ref 13.0–17.0)
MCH: 32 pg (ref 26.0–34.0)
MCHC: 34.9 g/dL (ref 30.0–36.0)
MCV: 91.7 fL (ref 80.0–100.0)
Platelets: 206 10*3/uL (ref 150–400)
RBC: 5.03 MIL/uL (ref 4.22–5.81)
RDW: 13.2 % (ref 11.5–15.5)
WBC: 8.6 10*3/uL (ref 4.0–10.5)
nRBC: 0 % (ref 0.0–0.2)

## 2018-11-21 LAB — BASIC METABOLIC PANEL
Anion gap: 7 (ref 5–15)
BUN: 10 mg/dL (ref 6–20)
CHLORIDE: 105 mmol/L (ref 98–111)
CO2: 28 mmol/L (ref 22–32)
CREATININE: 0.96 mg/dL (ref 0.61–1.24)
Calcium: 9.2 mg/dL (ref 8.9–10.3)
GFR calc Af Amer: >60 mL/min (ref >60)
GFR calc non Af Amer: >60 mL/min (ref >60)
Glucose, Bld: 107 mg/dL — ABNORMAL HIGH (ref 70–99)
Potassium: 3.9 mmol/L (ref 3.5–5.1)
Sodium: 140 mmol/L (ref 135–145)

## 2018-11-21 LAB — LIPASE, BLOOD: Lipase: 34 U/L (ref 11–51)

## 2018-11-21 LAB — TROPONIN I: Troponin I: <0.03 ng/mL (ref <0.03)

## 2018-11-21 LAB — BRAIN NATRIURETIC PEPTIDE: B Natriuretic Peptide: 29 pg/mL (ref 0.0–100.0)

## 2018-11-21 MED ORDER — PREDNISONE 20 MG PO TABS
60.0000 mg | ORAL_TABLET | Freq: Once | ORAL | Status: AC
  Administered 2018-11-21: 60 mg via ORAL
  Filled 2018-11-21: qty 3

## 2018-11-21 MED ORDER — METHYLPREDNISOLONE SODIUM SUCC 125 MG IJ SOLR: 125.0000 mg | Freq: Once | INTRAMUSCULAR | Status: DC

## 2018-11-21 MED ORDER — OXYCODONE-ACETAMINOPHEN 5-325 MG PO TABS
1.0000 | ORAL_TABLET | Freq: Once | ORAL | Status: AC
  Administered 2018-11-21: 1 via ORAL
  Filled 2018-11-21: qty 1

## 2018-11-21 MED ORDER — PREDNISONE 20 MG PO TABS: 40.0000 mg | ORAL_TABLET | Freq: Every day | ORAL | 0 refills | Status: DC

## 2018-11-21 MED ORDER — IPRATROPIUM-ALBUTEROL 0.5-2.5 (3) MG/3ML IN SOLN
3.0000 mL | Freq: Once | RESPIRATORY_TRACT | Status: AC
  Administered 2018-11-21: 3 mL via RESPIRATORY_TRACT
  Filled 2018-11-21: qty 3

## 2018-11-21 NOTE — ED Triage Notes (Signed)
Pt reports that he has a hx of CHF and  COPD - pt has been short of breath and unable to lay flat for the past 2 days - pt reports that left foot is swollen - breathing improved with duoneb given by EMS

## 2018-11-21 NOTE — ED Provider Notes (Signed)
[facility] Emergency Department Provider Note  Time seen: 6:31 PM  I have reviewed the triage vital signs and the nursing notes.   HISTORY  Chief Complaint Shortness of Breath and Chest Pain    HPI [name] is a 43 y.o. male with a past medical history of anxiety, asthma, CHF, gastric reflux, hypertension, COPD, presents to the emergency department for shortness of breath.  Patient states over the past 8 months he has been experiencing intermittent shortness of breath, especially with exertion.  Patient has a nebulizer machine at home states he used it today but did not feel any better so he came to the emergency department for evaluation.  Currently he states he feels better.  Denies any chest pain at any point.  Does state epigastric discomfort states this has been an ongoing issue times years.  Is asking for something to eat and drink.   Past Medical History:  Diagnosis Date  . Anxiety   . Asthma   . CHF (congestive heart failure) (HCC)   . Depression   . Encephalopathy   . GERD (gastroesophageal reflux disease)   . Hypertension   . Hyponatremia 06/2016  . Renal disorder    kidney injury  . Seizures [facility](HCC)     Patient Active Problem List   Diagnosis Date Noted  . Cellulitis of left lower extremity   . Cellulitis of left foot 03/07/2018  . COPD (chronic obstructive pulmonary disease) (HCC) 05/03/2017  . Hyponatremia 06/27/2016  . GERD (gastroesophageal reflux disease) 11/11/2015  . Borderline personality disorder (HCC) 11/11/2015  . Alcohol use disorder, severe, in controlled environment (HCC) 11/11/2015  . 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 Tobacco abuse 11/05/2013    Past Surgical History:  Procedure Laterality Date  . FINGER SURGERY Left    5th digit  . PLEURAL EFFUSION DRAINAGE Right 01/02/2016   Procedure: DRAINAGE OF PLEURAL  EFFUSION;  Surgeon: Loreli SlotSteven C Hendrickson, MD;  [facility];  Service: Thoracic;  Laterality: Right;  Marland Kitchen. VIDEO ASSISTED THORACOSCOPY (VATS)/DECORTICATION Right 01/02/2016   Procedure: VIDEO ASSISTED THORACOSCOPY (VATS)/DECORTICATION;  Surgeon: Loreli SlotSteven C Hendrickson, MD;  [facility];  Service: Thoracic;  Laterality: Right;  Marland Kitchen. VIDEO BRONCHOSCOPY N/A 01/02/2016   Procedure: VIDEO BRONCHOSCOPY;  Surgeon: Loreli SlotSteven C Hendrickson, MD;  [facility];  Service: Thoracic;  Laterality: N/A;    Prior to Admission medications   Medication Sig Start Date End Date Taking? Authorizing Provider  albuterol (PROVENTIL HFA;VENTOLIN HFA) 108 (90 Base) MCG/ACT inhaler Inhale 1-2 puffs into the lungs every 6 (six) hours as needed for wheezing or shortness of breath. 02/17/18   Zadie RhineWickline, Donald, MD  Cholecalciferol (VITAMIN D3) 2000 units TABS Take 1 tablet by mouth daily.    [provider]  cyclobenzaprine (FLEXERIL) 10 MG tablet Take 1 tablet (10 mg total) by mouth every 12 (twelve) hours as needed for muscle spasms. 03/08/18   Vassie LollMadera, Carlos, MD  diclofenac (VOLTAREN) 75 MG EC tablet Take 1 tablet by mouth twice a day for 3 days; then use q12hr as needd for pain. 03/08/18   Vassie LollMadera, Carlos, MD  disulfiram (ANTABUSE) 250 MG tablet Take 250 mg by mouth daily.    [provider]  divalproex (DEPAKOTE ER) 500 MG 24 hr tablet Take 4 tablets (2,000 mg total) by mouth at bedtime. 11/13/15   Jimmy FootmanHernandez-Gonzalez, Andrea, MD  escitalopram (LEXAPRO) 20 MG tablet Take 20 mg by mouth  every morning.    [provider]  esomeprazole (NEXIUM) 40 MG capsule Take 40 mg by mouth daily at 12 noon.    [provider]  feeding supplement, ENSURE ENLIVE, (ENSURE ENLIVE) LIQD Take 237 mLs by mouth 2 (two) times daily between meals. 03/08/18   Vassie Loll, MD  ferrous sulfate 325 (65 FE) MG tablet Take 1 tablet (325 mg total) by mouth 2 (two) times daily with a meal. 01/11/16   Regalado, Belkys A, MD   Fluticasone-Salmeterol (ADVAIR) 250-50 MCG/DOSE AEPB Inhale 1 puff into the lungs 2 (two) times daily.    [provider]  furosemide (LASIX) 20 MG tablet Take 1 tablet (20 mg total) by mouth daily. 03/08/18   Vassie Loll, MD  gabapentin (NEURONTIN) 300 MG capsule Take 300 mg by mouth 3 (three) times daily.    [provider]  hydrOXYzine (ATARAX/VISTARIL) 50 MG tablet Take 1 tablet (50 mg total) by mouth 3 (three) times daily. 05/07/17   Erick Blinks, MD  ketorolac (TORADOL) 10 MG tablet Take 1 tablet (10 mg total) by mouth every 8 (eight) hours. 07/14/18   Menshew, Charlesetta Ivory, PA-C  lactulose (CHRONULAC) 10 GM/15ML solution Take 45 mLs (30 g total) by mouth 2 (two) times daily. 05/07/17   Erick Blinks, MD  Melatonin 5 MG TABS Take 10 mg by mouth at bedtime.    [provider]  Multiple Vitamins-Minerals (THEREMS-M) TABS Take 1 tablet by mouth daily.    [provider]  primidone (MYSOLINE) 250 MG tablet Take 250 mg by mouth 3 (three) times daily.    [provider]  primidone (MYSOLINE) 50 MG tablet Take 50 mg by mouth 3 (three) times daily. Take with 250mg  with 50mg (300 mg tid)    [provider]  QUEtiapine (SEROQUEL) 200 MG tablet Take 200 mg by mouth at bedtime.    [provider]    Allergies  Allergen Reactions  . Morphine And Related   . Penicillins Nausea And Vomiting    Has patient had a PCN reaction causing immediate rash, facial/tongue/throat swelling, SOB or lightheadedness with hypotension:No Has patient had a PCN reaction causing severe rash involving mucus membranes or skin necrosis:No Has patient had a PCN reaction that required hospitalization:No Has patient had a PCN reaction occurring within the last 10 years:NO If all of the above answers are "NO", then may proceed with Cephalosporin use.      Family History  Problem Relation Age of Onset  . CAD Mother   . Diabetes Mellitus II Father   .  Prostate cancer Paternal Grandfather     Social History Social History   Tobacco Use  . Smoking status: Current Every Day Smoker    Packs/day: 0.00    Years: 20.00    Pack years: 0.00    Types: Pipe  . Smokeless tobacco: Never Used  Substance Use Topics  . Alcohol use: No    Comment: pt states he no longer drinks  . Drug use: No    Review of Systems Constitutional: Negative for fever. Cardiovascular: Negative for chest pain. Respiratory: Positive for shortness of breath Gastrointestinal: Positive for epigastric abdominal pain times years.  Negative for vomiting or diarrhea Genitourinary: Negative for urinary compaints Musculoskeletal: Negative for musculoskeletal complaints Skin: Negative for skin complaints  Neurological: Negative for headache All other ROS negative  ____________________________________________   PHYSICAL EXAM:  VITAL SIGNS: ED Triage Vitals  Enc Vitals Group     BP 11/21/18 1512 Marland Kitchen)  171/78     Pulse Rate 11/21/18 1512 70     Resp 11/21/18 1512 18     Temp 11/21/18 1512 98.6 F (37 C)     Temp Source 11/21/18 1512 Oral     SpO2 11/21/18 1512 93 %     Weight 11/21/18 1513 165 lb 5.5 oz (75 kg)     Height 11/21/18 1513 5\' 11"  (1.803 m)     Head Circumference --      Peak Flow --      Pain Score 11/21/18 1512 8     Pain Loc --      Pain Edu? --      Excl. in GC? --    Constitutional: Alert and oriented. Well appearing and in no distress. Eyes: Normal exam ENT   Head: Normocephalic and atraumatic.   Mouth/Throat: Mucous membranes are moist. Cardiovascular: Normal rate, regular rhythm. No murmur Respiratory: Normal respiratory effort without tachypnea nor retractions. Breath sounds are clear  Gastrointestinal: Soft, mild epigastric tenderness otherwise benign abdominal exam. Musculoskeletal: Nontender with normal range of motion in all extremities.  Neurologic:  Normal speech and language. No gross focal neurologic deficits Skin:   Skin is warm, dry and intact.  Psychiatric: Mood and affect are normal.   ____________________________________________    EKG  EKG viewed and interpreted by myself shows a normal sinus rhythm at 72 bpm with a narrow QRS, normal axis, normal intervals nonspecific ST changes  ____________________________________________    RADIOLOGY  Chest x-ray shows slight pleural effusion otherwise negative  ____________________________________________   INITIAL IMPRESSION / ASSESSMENT AND PLAN / ED COURSE  Pertinent labs & imaging results that were available during my care of the patient were reviewed by me and considered in my medical decision making (see chart for details).  Patient presents to the emergency department for shortness of breath intermittent and ongoing x8 months along with epigastric discomfort intermittent and ongoing times years.  Patient overall appears very well, minimal expiratory wheeze on exam we will dose DuoNeb's and prednisone.  Patient's labs are largely within normal limits we will add on a troponin.  Patient overall appears very well asking for food and drink.  We will continue to closely monitor while awaiting lipase and troponin to result.  We will dose DuoNeb's and prednisone.  Patient's work-up is essentially negative.  Lipase is normal.  Troponin is normal.  We will discharge with PCP follow-up.  We will send on a short course of prednisone.  Patient agreeable to plan of care.  ____________________________________________   FINAL CLINICAL IMPRESSION(S) / ED DIAGNOSES  Dyspnea Abdominal pain   Minna Antis, MD 11/21/18 [phone]

## 2018-11-21 NOTE — ED Notes (Signed)
On-call SW [phone]

## 2018-11-21 NOTE — ED Notes (Addendum)
I attempted to call pt's legal guardian. No answer. Pt gave nurse his guardian's supervisor's number [phone]

## 2018-11-21 NOTE — ED Notes (Addendum)
[name] from [facility] [phone].

## 2018-11-21 NOTE — ED Notes (Signed)
Message left with legal guardian Call to [facility] at group home for a ride. No answer. Will recall

## 2018-11-21 NOTE — ED Notes (Signed)
Attempt to call all numbers unsuccessful in reaching anyone

## 2018-11-21 NOTE — ED Notes (Signed)
[employer] SW stating that he would be calling the [employer] to notify them of pt's arrival to ED incase consent is needed.

## 2018-11-21 NOTE — ED Triage Notes (Signed)
FIRST NURSE NOTE-pt arrived EMS for [facility]. Received duoneb via EMS. Has legal guardian that will need to be contacted.  Has had to have thoracentesis in past.  VSS with EMS.

## 2018-12-01 ENCOUNTER — Inpatient Hospital Stay: Payer: Medicare HMO | Admitting: Anesthesiology

## 2018-12-01 ENCOUNTER — Inpatient Hospital Stay: Payer: Medicare HMO

## 2018-12-01 ENCOUNTER — Other Ambulatory Visit: Payer: Self-pay

## 2018-12-01 ENCOUNTER — Encounter
Admission: EM | Disposition: A | Discharge status: Other Acute Inpatient Hospital | Payer: Self-pay | Source: Home / Self Care | Attending: Orthopedic Surgery

## 2018-12-01 ENCOUNTER — Inpatient Hospital Stay
Admission: EM | Admit: 2018-12-01 | Discharge: 2018-12-03 | DRG: 493 | Disposition: A | Discharge status: Other Acute Inpatient Hospital | Payer: Medicare HMO | Attending: Orthopedic Surgery | Admitting: Orthopedic Surgery

## 2018-12-01 ENCOUNTER — Encounter: Payer: Self-pay | Admitting: Emergency Medicine

## 2018-12-01 ENCOUNTER — Emergency Department: Payer: Medicare HMO

## 2018-12-01 DIAGNOSIS — Z79891 Long term (current) use of opiate analgesic: Secondary | ICD-10-CM | POA: Diagnosis not present

## 2018-12-01 DIAGNOSIS — Z833 Family history of diabetes mellitus: Secondary | ICD-10-CM

## 2018-12-01 DIAGNOSIS — F603 Borderline personality disorder: Secondary | ICD-10-CM | POA: Diagnosis present

## 2018-12-01 DIAGNOSIS — Z7951 Long term (current) use of inhaled steroids: Secondary | ICD-10-CM | POA: Diagnosis not present

## 2018-12-01 DIAGNOSIS — S82131A Displaced fracture of medial condyle of right tibia, initial encounter for closed fracture: Secondary | ICD-10-CM

## 2018-12-01 DIAGNOSIS — F1020 Alcohol dependence, uncomplicated: Secondary | ICD-10-CM | POA: Diagnosis present

## 2018-12-01 DIAGNOSIS — Z7952 Long term (current) use of systemic steroids: Secondary | ICD-10-CM | POA: Diagnosis not present

## 2018-12-01 DIAGNOSIS — Z8042 Family history of malignant neoplasm of prostate: Secondary | ICD-10-CM | POA: Diagnosis not present

## 2018-12-01 DIAGNOSIS — S83111A Anterior subluxation of proximal end of tibia, right knee, initial encounter: Secondary | ICD-10-CM | POA: Diagnosis present

## 2018-12-01 DIAGNOSIS — K219 Gastro-esophageal reflux disease without esophagitis: Secondary | ICD-10-CM | POA: Diagnosis present

## 2018-12-01 DIAGNOSIS — I509 Heart failure, unspecified: Secondary | ICD-10-CM | POA: Diagnosis present

## 2018-12-01 DIAGNOSIS — R52 Pain, unspecified: Secondary | ICD-10-CM

## 2018-12-01 DIAGNOSIS — G40909 Epilepsy, unspecified, not intractable, without status epilepticus: Secondary | ICD-10-CM | POA: Diagnosis present

## 2018-12-01 DIAGNOSIS — Z720 Tobacco use: Secondary | ICD-10-CM | POA: Diagnosis present

## 2018-12-01 DIAGNOSIS — Z79890 Hormone replacement therapy (postmenopausal): Secondary | ICD-10-CM | POA: Diagnosis not present

## 2018-12-01 DIAGNOSIS — Z885 Allergy status to narcotic agent status: Secondary | ICD-10-CM | POA: Diagnosis not present

## 2018-12-01 DIAGNOSIS — I110 Hypertensive heart disease with heart failure: Secondary | ICD-10-CM | POA: Diagnosis present

## 2018-12-01 DIAGNOSIS — F17290 Nicotine dependence, other tobacco product, uncomplicated: Secondary | ICD-10-CM | POA: Diagnosis present

## 2018-12-01 DIAGNOSIS — L03116 Cellulitis of left lower limb: Secondary | ICD-10-CM | POA: Diagnosis present

## 2018-12-01 DIAGNOSIS — J449 Chronic obstructive pulmonary disease, unspecified: Secondary | ICD-10-CM | POA: Diagnosis present

## 2018-12-01 DIAGNOSIS — S82101A Unspecified fracture of upper end of right tibia, initial encounter for closed fracture: Secondary | ICD-10-CM

## 2018-12-01 DIAGNOSIS — E871 Hypo-osmolality and hyponatremia: Secondary | ICD-10-CM | POA: Diagnosis not present

## 2018-12-01 DIAGNOSIS — Z880 Allergy status to penicillin: Secondary | ICD-10-CM

## 2018-12-01 DIAGNOSIS — M25061 Hemarthrosis, right knee: Secondary | ICD-10-CM | POA: Diagnosis present

## 2018-12-01 DIAGNOSIS — R402412 Glasgow coma scale score 13-15, at arrival to emergency department: Secondary | ICD-10-CM | POA: Diagnosis present

## 2018-12-01 DIAGNOSIS — S82141A Displaced bicondylar fracture of right tibia, initial encounter for closed fracture: Secondary | ICD-10-CM | POA: Diagnosis not present

## 2018-12-01 DIAGNOSIS — Z8249 Family history of ischemic heart disease and other diseases of the circulatory system: Secondary | ICD-10-CM | POA: Diagnosis not present

## 2018-12-01 DIAGNOSIS — F332 Major depressive disorder, recurrent severe without psychotic features: Secondary | ICD-10-CM | POA: Diagnosis present

## 2018-12-01 DIAGNOSIS — Z79899 Other long term (current) drug therapy: Secondary | ICD-10-CM | POA: Diagnosis not present

## 2018-12-01 DIAGNOSIS — Y9355 Activity, bike riding: Secondary | ICD-10-CM | POA: Diagnosis not present

## 2018-12-01 DIAGNOSIS — F419 Anxiety disorder, unspecified: Secondary | ICD-10-CM | POA: Diagnosis present

## 2018-12-01 DIAGNOSIS — M25569 Pain in unspecified knee: Secondary | ICD-10-CM

## 2018-12-01 HISTORY — PX: EXTERNAL FIXATION LEG: SHX1549

## 2018-12-01 LAB — CBC WITH DIFFERENTIAL/PLATELET
ABS IMMATURE GRANULOCYTES: 0.04 10*3/uL (ref 0.00–0.07)
BASOS ABS: 0.1 10*3/uL (ref 0.0–0.1)
Basophils Relative: 1 %
EOS ABS: 0.1 10*3/uL (ref 0.0–0.5)
Eosinophils Relative: 2 %
HEMATOCRIT: 45.7 % (ref 39.0–52.0)
Hemoglobin: 16 g/dL (ref 13.0–17.0)
IMMATURE GRANULOCYTES: 1 %
LYMPHS ABS: 1.7 10*3/uL (ref 0.7–4.0)
LYMPHS PCT: 20 %
MCH: 31.7 pg (ref 26.0–34.0)
MCHC: 35 g/dL (ref 30.0–36.0)
MCV: 90.7 fL (ref 80.0–100.0)
MONOS PCT: 9 %
Monocytes Absolute: 0.7 10*3/uL (ref 0.1–1.0)
NEUTROS ABS: 5.5 10*3/uL (ref 1.7–7.7)
NEUTROS PCT: 67 %
NRBC: 0 % (ref 0.0–0.2)
PLATELETS: 223 10*3/uL (ref 150–400)
RBC: 5.04 MIL/uL (ref 4.22–5.81)
RDW: 13.4 % (ref 11.5–15.5)
WBC: 8.1 10*3/uL (ref 4.0–10.5)

## 2018-12-01 LAB — COMPREHENSIVE METABOLIC PANEL
ALK PHOS: 57 U/L (ref 38–126)
ALT: 32 U/L (ref 0–44)
ANION GAP: 11 (ref 5–15)
AST: 49 U/L — AB (ref 15–41)
Albumin: 4.2 g/dL (ref 3.5–5.0)
BILIRUBIN TOTAL: 0.7 mg/dL (ref 0.3–1.2)
BUN: 13 mg/dL (ref 6–20)
CO2: 23 mmol/L (ref 22–32)
Calcium: 8.8 mg/dL — ABNORMAL LOW (ref 8.9–10.3)
Chloride: 103 mmol/L (ref 98–111)
Creatinine, Ser: 0.87 mg/dL (ref 0.61–1.24)
GFR calc Af Amer: >60 mL/min (ref >60)
GFR calc non Af Amer: >60 mL/min (ref >60)
GLUCOSE: 96 mg/dL (ref 70–99)
POTASSIUM: 4.3 mmol/L (ref 3.5–5.1)
SODIUM: 137 mmol/L (ref 135–145)
Total Protein: 7 g/dL (ref 6.5–8.1)

## 2018-12-01 LAB — ETHANOL: Alcohol, Ethyl (B): <10 mg/dL (ref <10)

## 2018-12-01 SURGERY — EXTERNAL FIXATION, LOWER EXTREMITY
Anesthesia: General | Laterality: Right

## 2018-12-01 MED ORDER — HYDROMORPHONE HCL 1 MG/ML IJ SOLN: 0.5000 mg | INTRAMUSCULAR | Status: DC | PRN

## 2018-12-01 MED ORDER — TRAMADOL HCL 50 MG PO TABS
50.0000 mg | ORAL_TABLET | Freq: Four times a day (QID) | ORAL | Status: DC
  Administered 2018-12-02 – 2018-12-03 (×4): 50 mg via ORAL
  Filled 2018-12-01 (×4): qty 1

## 2018-12-01 MED ORDER — PRIMIDONE 250 MG PO TABS
250.0000 mg | ORAL_TABLET | Freq: Three times a day (TID) | ORAL | Status: DC
  Administered 2018-12-01 – 2018-12-03 (×5): 250 mg via ORAL
  Filled 2018-12-01 (×7): qty 1

## 2018-12-01 MED ORDER — DOCUSATE SODIUM 100 MG PO CAPS
100.0000 mg | ORAL_CAPSULE | Freq: Two times a day (BID) | ORAL | Status: DC
  Administered 2018-12-01 – 2018-12-03 (×3): 100 mg via ORAL
  Filled 2018-12-01 (×4): qty 1

## 2018-12-01 MED ORDER — PANTOPRAZOLE SODIUM 40 MG PO TBEC
40.0000 mg | DELAYED_RELEASE_TABLET | Freq: Every day | ORAL | Status: DC
  Administered 2018-12-02 – 2018-12-03 (×2): 40 mg via ORAL
  Filled 2018-12-01 (×2): qty 1

## 2018-12-01 MED ORDER — VITAMIN D 25 MCG (1000 UNIT) PO TABS
2000.0000 [IU] | ORAL_TABLET | Freq: Every day | ORAL | Status: DC
  Administered 2018-12-02 – 2018-12-03 (×2): 2000 [IU] via ORAL
  Filled 2018-12-01 (×2): qty 2

## 2018-12-01 MED ORDER — FENTANYL CITRATE (PF) 100 MCG/2ML IJ SOLN
50.0000 ug | Freq: Once | INTRAMUSCULAR | Status: AC
  Administered 2018-12-01: 50 ug via INTRAVENOUS

## 2018-12-01 MED ORDER — FENTANYL CITRATE (PF) 100 MCG/2ML IJ SOLN
INTRAMUSCULAR | Status: DC | PRN
  Administered 2018-12-01: 100 ug via INTRAVENOUS

## 2018-12-01 MED ORDER — MIDAZOLAM HCL 2 MG/2ML IJ SOLN
INTRAMUSCULAR | Status: DC | PRN
  Administered 2018-12-01: 2 mg via INTRAVENOUS

## 2018-12-01 MED ORDER — MIDAZOLAM HCL 2 MG/2ML IJ SOLN
INTRAMUSCULAR | Status: AC
  Filled 2018-12-01: qty 2

## 2018-12-01 MED ORDER — SUCCINYLCHOLINE CHLORIDE 20 MG/ML IJ SOLN
INTRAMUSCULAR | Status: AC
  Filled 2018-12-01: qty 1

## 2018-12-01 MED ORDER — OXYCODONE HCL 5 MG PO TABS
10.0000 mg | ORAL_TABLET | ORAL | Status: DC | PRN
  Administered 2018-12-02: 15 mg via ORAL
  Filled 2018-12-01: qty 3

## 2018-12-01 MED ORDER — DEXAMETHASONE SODIUM PHOSPHATE 10 MG/ML IJ SOLN
INTRAMUSCULAR | Status: DC | PRN
  Administered 2018-12-01: 10 mg via INTRAVENOUS

## 2018-12-01 MED ORDER — MELATONIN 5 MG PO TABS
10.0000 mg | ORAL_TABLET | Freq: Every day | ORAL | Status: DC
  Administered 2018-12-01 – 2018-12-02 (×2): 10 mg via ORAL
  Filled 2018-12-01 (×3): qty 2

## 2018-12-01 MED ORDER — PREDNISONE 20 MG PO TABS: 40.0000 mg | ORAL_TABLET | Freq: Every day | ORAL | Status: DC

## 2018-12-01 MED ORDER — PROPOFOL 10 MG/ML IV BOLUS
INTRAVENOUS | Status: DC | PRN
  Administered 2018-12-01: 200 mg via INTRAVENOUS

## 2018-12-01 MED ORDER — LACTULOSE 10 GM/15ML PO SOLN
30.0000 g | Freq: Two times a day (BID) | ORAL | Status: DC
  Administered 2018-12-01 – 2018-12-02 (×2): 30 g via ORAL
  Filled 2018-12-01 (×3): qty 60

## 2018-12-01 MED ORDER — FLEET ENEMA 7-19 GM/118ML RE ENEM: 1.0000 | ENEMA | Freq: Once | RECTAL | Status: DC | PRN

## 2018-12-01 MED ORDER — CLINDAMYCIN PHOSPHATE 600 MG/50ML IV SOLN
600.0000 mg | Freq: Four times a day (QID) | INTRAVENOUS | Status: AC
  Administered 2018-12-01 – 2018-12-02 (×2): 600 mg via INTRAVENOUS
  Filled 2018-12-01 (×2): qty 50

## 2018-12-01 MED ORDER — CLINDAMYCIN PHOSPHATE 600 MG/50ML IV SOLN
600.0000 mg | Freq: Once | INTRAVENOUS | Status: AC
  Administered 2018-12-01: 600 mg via INTRAVENOUS
  Filled 2018-12-01: qty 50

## 2018-12-01 MED ORDER — MAGNESIUM CITRATE PO SOLN: 1.0000 | Freq: Once | ORAL | Status: DC | PRN

## 2018-12-01 MED ORDER — FENTANYL CITRATE (PF) 100 MCG/2ML IJ SOLN
INTRAMUSCULAR | Status: AC
  Filled 2018-12-01: qty 2

## 2018-12-01 MED ORDER — HYDROXYZINE HCL 50 MG PO TABS
50.0000 mg | ORAL_TABLET | Freq: Three times a day (TID) | ORAL | Status: DC
  Administered 2018-12-01 – 2018-12-03 (×5): 50 mg via ORAL
  Filled 2018-12-01 (×7): qty 1

## 2018-12-01 MED ORDER — SUGAMMADEX SODIUM 200 MG/2ML IV SOLN
INTRAVENOUS | Status: DC | PRN
  Administered 2018-12-01: 200 mg via INTRAVENOUS

## 2018-12-01 MED ORDER — KETAMINE HCL 10 MG/ML IJ SOLN: 0.2000 mg/kg | Freq: Once | INTRAMUSCULAR | Status: DC

## 2018-12-01 MED ORDER — ONDANSETRON HCL 4 MG/2ML IJ SOLN
4.0000 mg | Freq: Four times a day (QID) | INTRAMUSCULAR | Status: DC | PRN
  Administered 2018-12-01 – 2018-12-02 (×3): 4 mg via INTRAVENOUS
  Filled 2018-12-01 (×3): qty 2

## 2018-12-01 MED ORDER — QUETIAPINE FUMARATE 25 MG PO TABS
200.0000 mg | ORAL_TABLET | Freq: Every day | ORAL | Status: DC
  Administered 2018-12-01 – 2018-12-02 (×2): 200 mg via ORAL
  Filled 2018-12-01 (×2): qty 8

## 2018-12-01 MED ORDER — MOMETASONE FURO-FORMOTEROL FUM 200-5 MCG/ACT IN AERO
2.0000 | INHALATION_SPRAY | Freq: Two times a day (BID) | RESPIRATORY_TRACT | Status: DC
  Administered 2018-12-02 – 2018-12-03 (×2): 2 via RESPIRATORY_TRACT
  Filled 2018-12-01: qty 8.8

## 2018-12-01 MED ORDER — ADULT MULTIVITAMIN W/MINERALS CH
1.0000 | ORAL_TABLET | Freq: Every day | ORAL | Status: DC
  Administered 2018-12-02 – 2018-12-03 (×2): 1 via ORAL
  Filled 2018-12-01 (×2): qty 1

## 2018-12-01 MED ORDER — MENTHOL 3 MG MT LOZG: 1.0000 | LOZENGE | OROMUCOSAL | Status: DC | PRN

## 2018-12-01 MED ORDER — ALBUTEROL SULFATE (2.5 MG/3ML) 0.083% IN NEBU: 3.0000 mL | INHALATION_SOLUTION | Freq: Four times a day (QID) | RESPIRATORY_TRACT | Status: DC | PRN

## 2018-12-01 MED ORDER — LIDOCAINE HCL (CARDIAC) PF 100 MG/5ML IV SOSY
PREFILLED_SYRINGE | INTRAVENOUS | Status: DC | PRN
  Administered 2018-12-01: 100 mg via INTRAVENOUS

## 2018-12-01 MED ORDER — FENTANYL CITRATE (PF) 100 MCG/2ML IJ SOLN
100.0000 ug | Freq: Once | INTRAMUSCULAR | Status: AC
  Administered 2018-12-01: 100 ug via INTRAVENOUS
  Filled 2018-12-01: qty 2

## 2018-12-01 MED ORDER — KETAMINE HCL 10 MG/ML IJ SOLN
0.2000 mg/kg | Freq: Once | INTRAMUSCULAR | Status: AC
  Administered 2018-12-01: 15 mg via INTRAVENOUS

## 2018-12-01 MED ORDER — ROCURONIUM BROMIDE 50 MG/5ML IV SOLN
INTRAVENOUS | Status: AC
  Filled 2018-12-01: qty 1

## 2018-12-01 MED ORDER — FERROUS SULFATE 325 (65 FE) MG PO TABS
325.0000 mg | ORAL_TABLET | Freq: Two times a day (BID) | ORAL | Status: DC
  Administered 2018-12-02 – 2018-12-03 (×3): 325 mg via ORAL
  Filled 2018-12-01 (×3): qty 1

## 2018-12-01 MED ORDER — HYDROMORPHONE HCL 1 MG/ML IJ SOLN
1.0000 mg | INTRAMUSCULAR | Status: DC | PRN
  Administered 2018-12-01 – 2018-12-02 (×4): 1 mg via INTRAVENOUS
  Filled 2018-12-01 (×4): qty 1

## 2018-12-01 MED ORDER — KETAMINE HCL 50 MG/ML IJ SOLN
INTRAMUSCULAR | Status: AC
  Filled 2018-12-01: qty 10

## 2018-12-01 MED ORDER — ALUM & MAG HYDROXIDE-SIMETH 200-200-20 MG/5ML PO SUSP
30.0000 mL | ORAL | Status: DC | PRN
  Administered 2018-12-03: 30 mL via ORAL
  Filled 2018-12-01: qty 30

## 2018-12-01 MED ORDER — ENSURE ENLIVE PO LIQD
237.0000 mL | Freq: Two times a day (BID) | ORAL | Status: DC
  Administered 2018-12-02 – 2018-12-03 (×4): 237 mL via ORAL

## 2018-12-01 MED ORDER — FENTANYL CITRATE (PF) 100 MCG/2ML IJ SOLN
25.0000 ug | INTRAMUSCULAR | Status: DC | PRN
  Administered 2018-12-01 (×3): 25 ug via INTRAVENOUS

## 2018-12-01 MED ORDER — ESCITALOPRAM OXALATE 10 MG PO TABS
20.0000 mg | ORAL_TABLET | ORAL | Status: DC
  Administered 2018-12-02 – 2018-12-03 (×2): 20 mg via ORAL
  Filled 2018-12-01 (×2): qty 2

## 2018-12-01 MED ORDER — METHOCARBAMOL 1000 MG/10ML IJ SOLN: 500.0000 mg | Freq: Four times a day (QID) | INTRAVENOUS | Status: DC | PRN

## 2018-12-01 MED ORDER — OXYCODONE HCL 5 MG PO TABS
5.0000 mg | ORAL_TABLET | ORAL | Status: DC | PRN
  Administered 2018-12-01 – 2018-12-02 (×3): 10 mg via ORAL
  Filled 2018-12-01 (×3): qty 2

## 2018-12-01 MED ORDER — PHENOL 1.4 % MT LIQD: 1.0000 | OROMUCOSAL | Status: DC | PRN

## 2018-12-01 MED ORDER — FENTANYL CITRATE (PF) 100 MCG/2ML IJ SOLN: 25.0000 ug | INTRAMUSCULAR | Status: DC | PRN

## 2018-12-01 MED ORDER — KETOROLAC TROMETHAMINE 15 MG/ML IJ SOLN: 15.0000 mg | Freq: Four times a day (QID) | INTRAMUSCULAR | Status: DC

## 2018-12-01 MED ORDER — HYDROCODONE-ACETAMINOPHEN 5-325 MG PO TABS: 1.0000 | ORAL_TABLET | Freq: Four times a day (QID) | ORAL | Status: DC | PRN

## 2018-12-01 MED ORDER — FENTANYL CITRATE (PF) 100 MCG/2ML IJ SOLN: 50.0000 ug | Freq: Once | INTRAMUSCULAR | Status: DC

## 2018-12-01 MED ORDER — DIVALPROEX SODIUM ER 500 MG PO TB24
2000.0000 mg | ORAL_TABLET | Freq: Every day | ORAL | Status: DC
  Administered 2018-12-01 – 2018-12-02 (×2): 2000 mg via ORAL
  Filled 2018-12-01 (×3): qty 4

## 2018-12-01 MED ORDER — CYCLOBENZAPRINE HCL 10 MG PO TABS
10.0000 mg | ORAL_TABLET | Freq: Two times a day (BID) | ORAL | Status: DC | PRN
  Administered 2018-12-02 – 2018-12-03 (×2): 10 mg via ORAL
  Filled 2018-12-01 (×3): qty 1

## 2018-12-01 MED ORDER — LIDOCAINE HCL (PF) 2 % IJ SOLN
INTRAMUSCULAR | Status: AC
  Filled 2018-12-01: qty 10

## 2018-12-01 MED ORDER — ONDANSETRON HCL 4 MG/2ML IJ SOLN: 4.0000 mg | Freq: Once | INTRAMUSCULAR | Status: DC | PRN

## 2018-12-01 MED ORDER — ENOXAPARIN SODIUM 40 MG/0.4ML ~~LOC~~ SOLN
40.0000 mg | SUBCUTANEOUS | Status: DC
  Administered 2018-12-02: 40 mg via SUBCUTANEOUS
  Filled 2018-12-01: qty 0.4

## 2018-12-01 MED ORDER — ONDANSETRON HCL 4 MG PO TABS: 4.0000 mg | ORAL_TABLET | Freq: Four times a day (QID) | ORAL | Status: DC | PRN

## 2018-12-01 MED ORDER — ALBUTEROL SULFATE HFA 108 (90 BASE) MCG/ACT IN AERS
INHALATION_SPRAY | RESPIRATORY_TRACT | Status: DC | PRN
  Administered 2018-12-01 (×2): 6 via RESPIRATORY_TRACT

## 2018-12-01 MED ORDER — BISACODYL 10 MG RE SUPP: 10.0000 mg | Freq: Every day | RECTAL | Status: DC | PRN

## 2018-12-01 MED ORDER — PRIMIDONE 50 MG PO TABS
50.0000 mg | ORAL_TABLET | Freq: Three times a day (TID) | ORAL | Status: DC
  Administered 2018-12-01 – 2018-12-03 (×5): 50 mg via ORAL
  Filled 2018-12-01 (×7): qty 1

## 2018-12-01 MED ORDER — POLYETHYLENE GLYCOL 3350 17 G PO PACK: 17.0000 g | PACK | Freq: Every day | ORAL | Status: DC | PRN

## 2018-12-01 MED ORDER — IOPAMIDOL (ISOVUE-370) INJECTION 76%
125.0000 mL | Freq: Once | INTRAVENOUS | Status: AC | PRN
  Administered 2018-12-01: 125 mL via INTRAVENOUS

## 2018-12-01 MED ORDER — IPRATROPIUM-ALBUTEROL 0.5-2.5 (3) MG/3ML IN SOLN
RESPIRATORY_TRACT | Status: AC
  Filled 2018-12-01: qty 3

## 2018-12-01 MED ORDER — KETAMINE HCL 10 MG/ML IJ SOLN
0.2000 mg/kg | Freq: Once | INTRAMUSCULAR | Status: AC
  Administered 2018-12-01: 15 mg via INTRAVENOUS
  Filled 2018-12-01: qty 1

## 2018-12-01 MED ORDER — DISULFIRAM 250 MG PO TABS
250.0000 mg | ORAL_TABLET | Freq: Every day | ORAL | Status: DC
  Administered 2018-12-02 – 2018-12-03 (×2): 250 mg via ORAL
  Filled 2018-12-01 (×2): qty 1

## 2018-12-01 MED ORDER — ONDANSETRON HCL 4 MG/2ML IJ SOLN
INTRAMUSCULAR | Status: DC | PRN
  Administered 2018-12-01: 4 mg via INTRAVENOUS

## 2018-12-01 MED ORDER — BISACODYL 10 MG RE SUPP
10.0000 mg | Freq: Every day | RECTAL | Status: DC | PRN
  Filled 2018-12-01: qty 1

## 2018-12-01 MED ORDER — HYDROMORPHONE HCL 1 MG/ML IJ SOLN
1.0000 mg | Freq: Once | INTRAMUSCULAR | Status: AC
  Administered 2018-12-01: 1 mg via INTRAVENOUS
  Filled 2018-12-01: qty 1

## 2018-12-01 MED ORDER — DOCUSATE SODIUM 100 MG PO CAPS: 100.0000 mg | ORAL_CAPSULE | Freq: Two times a day (BID) | ORAL | Status: DC

## 2018-12-01 MED ORDER — METHOCARBAMOL 500 MG PO TABS: 500.0000 mg | ORAL_TABLET | Freq: Four times a day (QID) | ORAL | Status: DC | PRN

## 2018-12-01 MED ORDER — ROCURONIUM BROMIDE 100 MG/10ML IV SOLN
INTRAVENOUS | Status: DC | PRN
  Administered 2018-12-01: 20 mg via INTRAVENOUS
  Administered 2018-12-01: 50 mg via INTRAVENOUS

## 2018-12-01 MED ORDER — MAGNESIUM HYDROXIDE 400 MG/5ML PO SUSP: 30.0000 mL | Freq: Every day | ORAL | Status: DC | PRN

## 2018-12-01 MED ORDER — FUROSEMIDE 20 MG PO TABS
20.0000 mg | ORAL_TABLET | Freq: Every day | ORAL | Status: DC
  Administered 2018-12-01 – 2018-12-03 (×3): 20 mg via ORAL
  Filled 2018-12-01 (×3): qty 1

## 2018-12-01 MED ORDER — ACETAMINOPHEN 325 MG PO TABS: 325.0000 mg | ORAL_TABLET | Freq: Four times a day (QID) | ORAL | Status: DC | PRN

## 2018-12-01 MED ORDER — SUGAMMADEX SODIUM 200 MG/2ML IV SOLN
INTRAVENOUS | Status: AC
  Filled 2018-12-01: qty 2

## 2018-12-01 MED ORDER — SODIUM CHLORIDE 0.9 % IV SOLN
INTRAVENOUS | Status: DC
  Administered 2018-12-01: 15:00:00 via INTRAVENOUS

## 2018-12-01 MED ORDER — POTASSIUM CHLORIDE IN NACL 20-0.9 MEQ/L-% IV SOLN
INTRAVENOUS | Status: DC
  Administered 2018-12-01: 22:00:00 via INTRAVENOUS
  Filled 2018-12-01 (×6): qty 1000

## 2018-12-01 MED ORDER — ACETAMINOPHEN 500 MG PO TABS
1000.0000 mg | ORAL_TABLET | Freq: Four times a day (QID) | ORAL | Status: AC
  Administered 2018-12-01 – 2018-12-02 (×3): 1000 mg via ORAL
  Filled 2018-12-01 (×3): qty 2

## 2018-12-01 MED ORDER — GABAPENTIN 300 MG PO CAPS
300.0000 mg | ORAL_CAPSULE | Freq: Three times a day (TID) | ORAL | Status: DC
  Administered 2018-12-01 – 2018-12-03 (×4): 300 mg via ORAL
  Filled 2018-12-01 (×5): qty 1

## 2018-12-01 SURGICAL SUPPLY — 57 items
BANDAGE ELASTIC 6 LF NS (GAUZE/BANDAGES/DRESSINGS) ×12 IMPLANT
BAR GLASS FIBER EXFX 11X500 (EXFIX) ×4 IMPLANT
BLADE SURG SZ10 CARB STEEL (BLADE) ×2 IMPLANT
BNDG COHESIVE 4X5 TAN STRL (GAUZE/BANDAGES/DRESSINGS) ×2 IMPLANT
BNDG ESMARK 6X12 TAN STRL LF (GAUZE/BANDAGES/DRESSINGS) IMPLANT
BNDG GAUZE 1X2.1 STRL (MISCELLANEOUS) ×8 IMPLANT
BRACE KNEE POST OP SHORT (BRACE) IMPLANT
CANISTER SUCT 1200ML W/VALVE (MISCELLANEOUS) ×2 IMPLANT
COOLER POLAR GLACIER W/PUMP (MISCELLANEOUS) IMPLANT
COVER WAND RF STERILE (DRAPES) IMPLANT
CUFF TOURN 24 STER (MISCELLANEOUS) ×2 IMPLANT
CUFF TOURN 30 STER DUAL PORT (MISCELLANEOUS) IMPLANT
DRAPE C-ARM XRAY 36X54 (DRAPES) ×2 IMPLANT
DRAPE C-ARMOR (DRAPES) ×2 IMPLANT
DRAPE INCISE IOBAN 66X45 STRL (DRAPES) ×2 IMPLANT
DRAPE U-SHAPE 47X51 STRL (DRAPES) ×2 IMPLANT
DURAPREP 26ML APPLICATOR (WOUND CARE) ×2 IMPLANT
ELECT REM PT RETURN 9FT ADLT (ELECTROSURGICAL) ×2
ELECTRODE REM PT RTRN 9FT ADLT (ELECTROSURGICAL) ×1 IMPLANT
GAUZE PETRO XEROFOAM 1X8 (MISCELLANEOUS) ×6 IMPLANT
GAUZE SPONGE 4X4 12PLY STRL (GAUZE/BANDAGES/DRESSINGS) IMPLANT
GAUZE XEROFORM 4X4 STRL (GAUZE/BANDAGES/DRESSINGS) ×2 IMPLANT
GLOVE BIOGEL PI IND STRL 9 (GLOVE) ×1 IMPLANT
GLOVE BIOGEL PI INDICATOR 9 (GLOVE) ×1
GLOVE SURG 9.0 ORTHO LTXF (GLOVE) ×10 IMPLANT
GOWN STRL REUS TWL 2XL XL LVL4 (GOWN DISPOSABLE) ×2 IMPLANT
GOWN STRL REUS W/ TWL LRG LVL3 (GOWN DISPOSABLE) ×1 IMPLANT
GOWN STRL REUS W/TWL LRG LVL3 (GOWN DISPOSABLE) ×1
IMMOB KNEE 14 THIGH 24 706614 (SOFTGOODS) IMPLANT
IV CATH ANGIO 14GX1.88 NO SAFE (IV SOLUTION) IMPLANT
KIT TURNOVER KIT A (KITS) ×2 IMPLANT
NDL SAFETY ECLIPSE 18X1.5 (NEEDLE) ×1 IMPLANT
NEEDLE HYPO 18GX1.5 SHARP (NEEDLE) ×1
NS IRRIG 500ML POUR BTL (IV SOLUTION) ×2 IMPLANT
PACK EXTREMITY ARMC (MISCELLANEOUS) ×2 IMPLANT
PAD ABD DERMACEA PRESS 5X9 (GAUZE/BANDAGES/DRESSINGS) IMPLANT
PAD PREP 24X41 OB/GYN DISP (PERSONAL CARE ITEMS) ×2 IMPLANT
PAD WRAPON POLAR KNEE (MISCELLANEOUS) IMPLANT
PADDING CAST 4IN STRL (MISCELLANEOUS)
PADDING CAST BLEND 4X4 STRL (MISCELLANEOUS) IMPLANT
PIN CLAMP 2BAR 75MM BLUE (EXFIX) ×4 IMPLANT
PIN HALF ORANGE 5X200X45MM (EXFIX) ×4 IMPLANT
PIN HALF YELLOW 5X160X35 (EXFIX) ×4 IMPLANT
RETRIEVER SUT HEWSON (MISCELLANEOUS) IMPLANT
SPONGE LAP 18X18 RF (DISPOSABLE) ×2 IMPLANT
STAPLER SKIN PROX 35W (STAPLE) ×2 IMPLANT
STOCKINETTE BIAS CUT 6 980064 (GAUZE/BANDAGES/DRESSINGS) IMPLANT
STOCKINETTE IMPERVIOUS 9X36 MD (GAUZE/BANDAGES/DRESSINGS) ×2 IMPLANT
SUT FIBERWIRE #5 38 BLUE (WIRE) IMPLANT
SUT STEEL 7 (SUTURE) IMPLANT
SUT TICRON 2-0 30IN 311381 (SUTURE) IMPLANT
SUT VIC AB 0 CT1 36 (SUTURE) ×2 IMPLANT
SUT VIC AB 2-0 CT1 (SUTURE) IMPLANT
SUT VIC AB 2-0 CT1 36 (SUTURE) ×2 IMPLANT
SYR 10ML LL (SYRINGE) ×2 IMPLANT
TRAY FOLEY MTR SLVR 16FR STAT (SET/KITS/TRAYS/PACK) ×2 IMPLANT
WRAPON POLAR PAD KNEE (MISCELLANEOUS)
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
## 2018-12-01 NOTE — Anesthesia Postprocedure Evaluation (Signed)
Anesthesia Post Note  Patient: [name]  Procedure(s) Performed: EXTERNAL FIXATION LEG (Right )  Patient location during evaluation: PACU Anesthesia Type: General Level of consciousness: awake and alert Pain management: pain level controlled Vital Signs Assessment: post-procedure vital signs reviewed and stable Respiratory status: spontaneous breathing and respiratory function stable Cardiovascular status: stable Anesthetic complications: no     Last Vitals:  Vitals:   12/01/18 1727 12/01/18 1740  BP: (!) 156/84   Pulse: (!) 101 92  Resp:    Temp:    SpO2: 100% 99%    Last Pain:  Vitals:   12/01/18 1717  TempSrc:   PainSc: 6                  Maranda Marte K

## 2018-12-01 NOTE — Anesthesia Preprocedure Evaluation (Addendum)
Anesthesia Evaluation  Patient identified by MRN, date of birth, ID band Patient awake    Reviewed: Allergy & Precautions, NPO status , Patient's Chart, lab work & pertinent test results  History of Anesthesia Complications Negative for: history of anesthetic complications  Airway Mallampati: III       Dental  (+) Edentulous Upper, Edentulous Lower   Pulmonary COPD,  COPD inhaler, Current Smoker,           Cardiovascular hypertension, Pt. on medications +CHF  (-) dysrhythmias (-) Valvular Problems/Murmurs     Neuro/Psych Anxiety Depression    GI/Hepatic Neg liver ROS, GERD  Medicated and Controlled,  Endo/Other  neg diabetes  Renal/GU negative Renal ROS     Musculoskeletal   Abdominal   Peds  Hematology   Anesthesia Other Findings   Reproductive/Obstetrics                            Anesthesia Physical Anesthesia Plan  ASA: III and emergent  Anesthesia Plan: General   Post-op Pain Management:    Induction: Intravenous  PONV Risk Score and Plan: 1 and Dexamethasone and Ondansetron  Airway Management Planned: Oral ETT  Additional Equipment:   Intra-op Plan:   Post-operative Plan:   Informed Consent: I have reviewed the patients History and Physical, chart, labs and discussed the procedure including the risks, benefits and alternatives for the proposed anesthesia with the patient or authorized representative who has indicated his/her understanding and acceptance.     Plan Discussed with:   Anesthesia Plan Comments:         Anesthesia Quick Evaluation

## 2018-12-01 NOTE — Anesthesia Post-op Follow-up Note (Signed)
Anesthesia QCDR form completed.        

## 2018-12-01 NOTE — ED Notes (Signed)
[name] (OR tech) present to transport pt to OR.
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
## 2018-12-01 NOTE — Addendum Note (Signed)
Addendum  created 12/01/18 1800 by Naomie Dean, MD   Order list changed, Order sets accessed

## 2018-12-01 NOTE — Anesthesia Procedure Notes (Addendum)
Procedure Name: Intubation Date/Time: 12/01/2018 3:38 PM Performed by: Estanislado Emms, CRNA Pre-anesthesia Checklist: Patient identified, Patient being monitored, Timeout performed, Emergency Drugs available and Suction available Patient Re-evaluated:Patient Re-evaluated prior to induction Oxygen Delivery Method: Circle system utilized Preoxygenation: Pre-oxygenation with 100% oxygen Induction Type: IV induction Ventilation: Mask ventilation without difficulty and Oral airway inserted - appropriate to patient size Laryngoscope Size: Miller and 2 Grade View: Grade II Tube type: Oral Tube size: 7.0 mm Number of attempts: 1 Airway Equipment and Method: Stylet Placement Confirmation: ETT inserted through vocal cords under direct vision,  positive ETCO2 and breath sounds checked- equal and bilateral Secured at: 21 cm Tube secured with: Tape Dental Injury: Teeth and Oropharynx as per pre-operative assessment  Comments: Bronchospasm upon intubation

## 2018-12-01 NOTE — Progress Notes (Signed)
Patient difficult to sooth and comfort.  Patient constantly Moving his monitor wires, moving the bed up and down Constantly, confused that he had a foley catheter in place. Reassured patient and asked multiple times to please not Remove his monitors and other connected devices And equipment.  Dr. Martha Clan aware and in the room to witness behavior.  Staff continued to reorient patient and Ask him to remain calm and still.
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
## 2018-12-01 NOTE — ED Notes (Signed)
Pt pt's chart, pt is ward of [facility]. On-call Child psychotherapist paged at this time. Awaiting callback.

## 2018-12-01 NOTE — Progress Notes (Signed)
15 minute call to floor. 

## 2018-12-01 NOTE — Progress Notes (Signed)
I personally reviewed the patient's CT scan and plain films.  Patient has a comminuted displaced fracture of the right tibial plateau with posterior subluxation of the femur.  I have reviewed the CT angiogram with the on-call intervention[facility].  There is no current obstruction to popliteal artery flow.  The runoff is unimpeded.  However, given the patient's degree of pain, knee dislocation and possibility for neurovascular injury from prolonged dislocation, I am declaring an emergency for this case for close reduction and external fixation placement.

## 2018-12-01 NOTE — Progress Notes (Signed)
Patient from ED alone.  Dr. Henrene Hawking ordered fentanyl for pain control.

## 2018-12-01 NOTE — Progress Notes (Signed)
Patient refused fentanyl in the ED, states it didn't work as well as another medicine he was given prior.  Patient asking names of medications given and wants to know if this pain medicine works as well as vicodin.

## 2018-12-01 NOTE — ED Notes (Signed)
Spoke to [employer] from [place] [employer] on-call SW to notify of arrival to ED and plans for emergent surgery for knee fracture today and possible 2nd surgery this Monday at [facility].

## 2018-12-01 NOTE — Transfer of Care (Signed)
Immediate Anesthesia Transfer of Care Note  Patient: [name]  Procedure(s) Performed: EXTERNAL FIXATION LEG (Right )  Patient Location: PACU  Anesthesia Type:General  Level of Consciousness: drowsy and patient cooperative  Airway & Oxygen Therapy: Patient Spontanous Breathing and Patient connected to nasal cannula oxygen  Post-op Assessment: Report given to RN and Post -op Vital signs reviewed and stable  Post vital signs: Reviewed and stable  Last Vitals:  Vitals Value Taken Time  BP 141/86 12/01/2018  5:12 PM  Temp 36.4 C 12/01/2018  5:12 PM  Pulse 82 12/01/2018  5:15 PM  Resp 26 12/01/2018  5:15 PM  SpO2 99 % 12/01/2018  5:15 PM  Vitals shown include unvalidated device data.  Last Pain:  Vitals:   12/01/18 1712  TempSrc:   PainSc: Asleep         Complications: No apparent anesthesia complications

## 2018-12-01 NOTE — Progress Notes (Signed)
Dr. Martha Clan talking with patient, explained to patient some Pain is to be expected and we may not be able to totally rid of his pain and to prepare himself for that.  Patient verbalized Understanding.

## 2018-12-02 ENCOUNTER — Encounter: Payer: Self-pay | Admitting: Orthopedic Surgery

## 2018-12-02 LAB — CBC
HCT: 43.1 % (ref 39.0–52.0)
Hemoglobin: 14.5 g/dL (ref 13.0–17.0)
MCH: 31.5 pg (ref 26.0–34.0)
MCHC: 33.6 g/dL (ref 30.0–36.0)
MCV: 93.7 fL (ref 80.0–100.0)
PLATELETS: 199 10*3/uL (ref 150–400)
RBC: 4.6 MIL/uL (ref 4.22–5.81)
RDW: 13.3 % (ref 11.5–15.5)
WBC: 12.8 10*3/uL — ABNORMAL HIGH (ref 4.0–10.5)
nRBC: 0 % (ref 0.0–0.2)

## 2018-12-02 LAB — BASIC METABOLIC PANEL
Anion gap: 9 (ref 5–15)
BUN: 12 mg/dL (ref 6–20)
CO2: 26 mmol/L (ref 22–32)
CREATININE: 0.69 mg/dL (ref 0.61–1.24)
Calcium: 8.4 mg/dL — ABNORMAL LOW (ref 8.9–10.3)
Chloride: 98 mmol/L (ref 98–111)
GFR calc Af Amer: >60 mL/min (ref >60)
GFR calc non Af Amer: >60 mL/min (ref >60)
Glucose, Bld: 130 mg/dL — ABNORMAL HIGH (ref 70–99)
Potassium: 3.6 mmol/L (ref 3.5–5.1)
Sodium: 133 mmol/L — ABNORMAL LOW (ref 135–145)

## 2018-12-02 MED ORDER — HYDROMORPHONE HCL 1 MG/ML IJ SOLN
1.0000 mg | INTRAMUSCULAR | Status: DC | PRN
  Administered 2018-12-02 – 2018-12-03 (×5): 1 mg via INTRAVENOUS
  Filled 2018-12-02 (×5): qty 1

## 2018-12-02 MED ORDER — THIAMINE HCL 100 MG/ML IJ SOLN: 100.0000 mg | Freq: Every day | INTRAMUSCULAR | Status: DC

## 2018-12-02 MED ORDER — VITAMIN B-1 100 MG PO TABS
100.0000 mg | ORAL_TABLET | Freq: Every day | ORAL | Status: DC
  Administered 2018-12-02 – 2018-12-03 (×2): 100 mg via ORAL
  Filled 2018-12-02 (×2): qty 1

## 2018-12-02 MED ORDER — LORAZEPAM 2 MG/ML IJ SOLN: 1.0000 mg | Freq: Four times a day (QID) | INTRAMUSCULAR | Status: DC | PRN

## 2018-12-02 MED ORDER — LORAZEPAM 1 MG PO TABS: 1.0000 mg | ORAL_TABLET | Freq: Four times a day (QID) | ORAL | Status: DC | PRN

## 2018-12-02 MED ORDER — OXYCODONE HCL 5 MG PO TABS
10.0000 mg | ORAL_TABLET | ORAL | Status: DC | PRN
  Administered 2018-12-02 – 2018-12-03 (×3): 15 mg via ORAL
  Filled 2018-12-02 (×2): qty 3

## 2018-12-02 MED ORDER — OXYCODONE HCL 5 MG PO TABS
5.0000 mg | ORAL_TABLET | ORAL | Status: DC | PRN
  Administered 2018-12-02 – 2018-12-03 (×3): 10 mg via ORAL
  Filled 2018-12-02 (×4): qty 2

## 2018-12-02 MED ORDER — LORAZEPAM 2 MG PO TABS: 0.0000 mg | ORAL_TABLET | Freq: Two times a day (BID) | ORAL | Status: DC

## 2018-12-02 MED ORDER — LORAZEPAM 2 MG PO TABS: 0.0000 mg | ORAL_TABLET | Freq: Four times a day (QID) | ORAL | Status: DC

## 2018-12-02 MED ORDER — FOLIC ACID 1 MG PO TABS
1.0000 mg | ORAL_TABLET | Freq: Every day | ORAL | Status: DC
  Administered 2018-12-02 – 2018-12-03 (×2): 1 mg via ORAL
  Filled 2018-12-02 (×2): qty 1

## 2018-12-02 NOTE — Progress Notes (Signed)
Patient is constantly complaining of pain that is unrelieved with current pain regimen
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
## 2018-12-02 NOTE — Plan of Care (Signed)
  Problem: Education: Goal: Knowledge of General Education information will improve Description Including pain rating scale, medication(s)/side effects and non-pharmacologic comfort measures Outcome: Progressing   Problem: Health Behavior/Discharge Planning: Goal: Ability to manage health-related needs will improve Outcome: Progressing   Problem: Clinical Measurements: Goal: Ability to maintain clinical measurements within normal limits will improve Outcome: Progressing Goal: Will remain free from infection Outcome: Progressing Goal: Diagnostic test results will improve Outcome: Progressing Goal: Respiratory complications will improve Outcome: Progressing Goal: Cardiovascular complication will be avoided Outcome: Progressing   Problem: Activity: Goal: Risk for activity intolerance will decrease Outcome: Progressing   Problem: Nutrition: Goal: Adequate nutrition will be maintained Outcome: Progressing   Problem: Coping: Goal: Level of anxiety will decrease Outcome: Progressing   Problem: Elimination: Goal: Will not experience complications related to bowel motility Outcome: Progressing Goal: Will not experience complications related to urinary retention Outcome: Progressing   Problem: Pain Managment: Goal: General experience of comfort will improve Outcome: Not Progressing   Problem: Safety: Goal: Ability to remain free from injury will improve Outcome: Progressing   Problem: Skin Integrity: Goal: Risk for impaired skin integrity will decrease Outcome: Progressing   Problem: Education: Goal: Required Educational Video(s) Outcome: Progressing   Problem: Clinical Measurements: Goal: Ability to maintain clinical measurements within normal limits will improve Outcome: Progressing Goal: Postoperative complications will be avoided or minimized Outcome: Progressing   Problem: Skin Integrity: Goal: Demonstration of wound healing without infection will  improve Outcome: Progressing

## 2018-12-03 ENCOUNTER — Encounter (HOSPITAL_COMMUNITY): Payer: Self-pay

## 2018-12-03 ENCOUNTER — Inpatient Hospital Stay (HOSPITAL_COMMUNITY): Payer: Medicare HMO

## 2018-12-03 ENCOUNTER — Other Ambulatory Visit: Payer: Self-pay

## 2018-12-03 ENCOUNTER — Inpatient Hospital Stay (HOSPITAL_COMMUNITY)
Admission: AD | Admit: 2018-12-03 | Discharge: 2018-12-13 | DRG: 494 | Disposition: A | Discharge status: Skilled Nursing Facility | Payer: Medicare HMO | Source: Other Acute Inpatient Hospital | Attending: Student | Admitting: Student

## 2018-12-03 DIAGNOSIS — F419 Anxiety disorder, unspecified: Secondary | ICD-10-CM | POA: Diagnosis not present

## 2018-12-03 DIAGNOSIS — Y9355 Activity, bike riding: Secondary | ICD-10-CM | POA: Diagnosis not present

## 2018-12-03 DIAGNOSIS — I509 Heart failure, unspecified: Secondary | ICD-10-CM | POA: Diagnosis present

## 2018-12-03 DIAGNOSIS — Z880 Allergy status to penicillin: Secondary | ICD-10-CM | POA: Diagnosis not present

## 2018-12-03 DIAGNOSIS — Z8249 Family history of ischemic heart disease and other diseases of the circulatory system: Secondary | ICD-10-CM

## 2018-12-03 DIAGNOSIS — S82111A Displaced fracture of right tibial spine, initial encounter for closed fracture: Secondary | ICD-10-CM | POA: Diagnosis present

## 2018-12-03 DIAGNOSIS — Z79899 Other long term (current) drug therapy: Secondary | ICD-10-CM

## 2018-12-03 DIAGNOSIS — R569 Unspecified convulsions: Secondary | ICD-10-CM | POA: Diagnosis not present

## 2018-12-03 DIAGNOSIS — K219 Gastro-esophageal reflux disease without esophagitis: Secondary | ICD-10-CM | POA: Diagnosis present

## 2018-12-03 DIAGNOSIS — Z8042 Family history of malignant neoplasm of prostate: Secondary | ICD-10-CM | POA: Diagnosis not present

## 2018-12-03 DIAGNOSIS — J449 Chronic obstructive pulmonary disease, unspecified: Secondary | ICD-10-CM | POA: Diagnosis present

## 2018-12-03 DIAGNOSIS — S82141A Displaced bicondylar fracture of right tibia, initial encounter for closed fracture: Secondary | ICD-10-CM

## 2018-12-03 DIAGNOSIS — F17210 Nicotine dependence, cigarettes, uncomplicated: Secondary | ICD-10-CM | POA: Diagnosis present

## 2018-12-03 DIAGNOSIS — Z7952 Long term (current) use of systemic steroids: Secondary | ICD-10-CM | POA: Diagnosis not present

## 2018-12-03 DIAGNOSIS — F329 Major depressive disorder, single episode, unspecified: Secondary | ICD-10-CM | POA: Diagnosis present

## 2018-12-03 DIAGNOSIS — S82131A Displaced fracture of medial condyle of right tibia, initial encounter for closed fracture: Secondary | ICD-10-CM

## 2018-12-03 DIAGNOSIS — I110 Hypertensive heart disease with heart failure: Secondary | ICD-10-CM | POA: Diagnosis present

## 2018-12-03 DIAGNOSIS — Z833 Family history of diabetes mellitus: Secondary | ICD-10-CM | POA: Diagnosis not present

## 2018-12-03 DIAGNOSIS — S82142A Displaced bicondylar fracture of left tibia, initial encounter for closed fracture: Secondary | ICD-10-CM

## 2018-12-03 DIAGNOSIS — Z885 Allergy status to narcotic agent status: Secondary | ICD-10-CM | POA: Diagnosis not present

## 2018-12-03 DIAGNOSIS — T148XXA Other injury of unspecified body region, initial encounter: Secondary | ICD-10-CM

## 2018-12-03 LAB — BASIC METABOLIC PANEL
Anion gap: 8 (ref 5–15)
BUN: 6 mg/dL (ref 6–20)
CO2: 27 mmol/L (ref 22–32)
Calcium: 8.6 mg/dL — ABNORMAL LOW (ref 8.9–10.3)
Chloride: 96 mmol/L — ABNORMAL LOW (ref 98–111)
Creatinine, Ser: 0.6 mg/dL — ABNORMAL LOW (ref 0.61–1.24)
GFR calc Af Amer: >60 mL/min (ref >60)
GFR calc non Af Amer: >60 mL/min (ref >60)
Glucose, Bld: 128 mg/dL — ABNORMAL HIGH (ref 70–99)
Potassium: 4.3 mmol/L (ref 3.5–5.1)
SODIUM: 131 mmol/L — AB (ref 135–145)

## 2018-12-03 LAB — CBC
HCT: 36.7 % — ABNORMAL LOW (ref 39.0–52.0)
Hemoglobin: 12.6 g/dL — ABNORMAL LOW (ref 13.0–17.0)
MCH: 32.4 pg (ref 26.0–34.0)
MCHC: 34.3 g/dL (ref 30.0–36.0)
MCV: 94.3 fL (ref 80.0–100.0)
Platelets: 127 10*3/uL — ABNORMAL LOW (ref 150–400)
RBC: 3.89 MIL/uL — ABNORMAL LOW (ref 4.22–5.81)
RDW: 13.2 % (ref 11.5–15.5)
WBC: 7.6 10*3/uL (ref 4.0–10.5)
nRBC: 0 % (ref 0.0–0.2)

## 2018-12-03 MED ORDER — SODIUM CHLORIDE 0.9% FLUSH: 3.0000 mL | INTRAVENOUS | Status: DC | PRN

## 2018-12-03 MED ORDER — SODIUM CHLORIDE 0.9 % IV SOLN: 250.0000 mL | INTRAVENOUS | Status: DC | PRN

## 2018-12-03 MED ORDER — HYDROMORPHONE HCL 1 MG/ML IJ SOLN
1.0000 mg | INTRAMUSCULAR | Status: DC | PRN
  Administered 2018-12-03 – 2018-12-11 (×42): 1 mg via INTRAVENOUS
  Filled 2018-12-03 (×43): qty 1

## 2018-12-03 MED ORDER — OXYCODONE-ACETAMINOPHEN 5-325 MG PO TABS
1.0000 | ORAL_TABLET | ORAL | Status: DC | PRN
  Administered 2018-12-03 – 2018-12-04 (×2): 1 via ORAL
  Filled 2018-12-03 (×3): qty 1

## 2018-12-03 MED ORDER — SODIUM CHLORIDE 0.9% FLUSH
3.0000 mL | Freq: Two times a day (BID) | INTRAVENOUS | Status: DC
  Administered 2018-12-03 – 2018-12-13 (×18): 3 mL via INTRAVENOUS

## 2018-12-03 NOTE — Progress Notes (Signed)
Patient was unable to sleep last two nights.  Now states hes able to sleep.  Easily arousable. VSS

## 2018-12-03 NOTE — Progress Notes (Signed)
Patient arrived to unit. Alert and verbal.  MD called for orders. Patient c/o pain to Rt LE. Patient made comfortable in bed and reassured that MD will prescribe pain medicine for him. Will cont to monitor.
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
## 2018-12-03 NOTE — Progress Notes (Addendum)
Gave report to [facility], who is enroute. Updated Ebony, guardian of room placement at [facility] [place]. Patient made aware.

## 2018-12-03 NOTE — Clinical Social Work Note (Signed)
Clinical Social Work Assessment  Patient Details  Name: [name] MRN: [id number] Date of Birth: 01/22/1976  Date of referral:  12/03/18               Reason for consult:  Other (Comment Required)(From [name])                Permission sought to share information with:  Facility Industrial/product designer granted to share information::  Yes, Verbal Permission Granted  Name::      [name]  Agency::     Relationship::     Contact Information:     Housing/Transportation Living arrangements for the past 2 months:  [name] Source of Information:  Guardian Patient Interpreter Needed:  None Criminal Activity/Legal Involvement Pertinent to Current Situation/Hospitalization:  No - Comment as needed Significant Relationships:  Other(Comment)([place] and [name] staff ) Lives with:  Facility Resident Do you feel safe going back to the place where you live?  Yes Need for family participation in patient care:  Yes (Comment)  Care giving concerns:  Patient is a resident at [name] located at [address], [place] [address].    Social Worker assessment / plan:  Visual merchandiser (CSW) reviewed chart and noted that patient is from a [name] and [name] has guardianship over patient. CSW contacted [name] [phone] social worker at [name]. Per [name] she is the assigned social worker to patient and [name] has been appointed guardianship for patient. [name] faxed guardianship appointment letter to CSW on 1A. CSW placed guardianship appointment letter on patient's chart and in packet that will go with him to [name]. Per [name] group [name] owner is [name]. CSW made [name] aware that plan is for patient to transfer to [name] because per chart he needs a orthopedic trauma surgeon. CSW will continue to follow and assist as needed.   Employment status:  Disabled (Comment on whether or not currently  receiving Disability) Insurance information:  Medicaid In [name], [name] PT Recommendations:  Not assessed at this time Information / Referral to community resources:  Skilled Nursing Facility(SNF vs. group [name] )  Patient/Family's Response to care:  Guardian is in agreement with transfer to [name].   Patient/Family's Understanding of and Emotional Response to Diagnosis, Current Treatment, and Prognosis:  Patient's [name] guardian was pleasant and thanked CSW for calling with an update.   Emotional Assessment Appearance:  Appears stated age Attitude/Demeanor/Rapport:  Unable to Assess Affect (typically observed):  Unable to Assess Orientation:  Oriented to Self, Oriented to Place, Fluctuating Orientation (Suspected and/or reported Sundowners) Alcohol / Substance use:  Not Applicable Psych involvement (Current and /or in the community):  No (Comment)  Discharge Needs  Concerns to be addressed:  Discharge Planning Concerns Readmission within the last 30 days:  No Current discharge risk:  Dependent with Mobility Barriers to Discharge:  Continued Medical Work up   [employer], Shane Pinsky, LCSW 12/03/2018, 2:51 PM

## 2018-12-03 NOTE — Progress Notes (Signed)
Patient is off the unit for CT.

## 2018-12-03 NOTE — Plan of Care (Signed)
  Problem: Education: Goal: Knowledge of General Education information will improve Description Including pain rating scale, medication(s)/side effects and non-pharmacologic comfort measures Outcome: Progressing   Problem: Health Behavior/Discharge Planning: Goal: Ability to manage health-related needs will improve Outcome: Progressing   Problem: Clinical Measurements: Goal: Ability to maintain clinical measurements within normal limits will improve Outcome: Progressing Goal: Will remain free from infection Outcome: Progressing Goal: Diagnostic test results will improve Outcome: Progressing Goal: Respiratory complications will improve Outcome: Progressing Goal: Cardiovascular complication will be avoided Outcome: Progressing   Problem: Activity: Goal: Risk for activity intolerance will decrease Outcome: Progressing   Problem: Nutrition: Goal: Adequate nutrition will be maintained Outcome: Progressing   Problem: Coping: Goal: Level of anxiety will decrease Outcome: Progressing   Problem: Elimination: Goal: Will not experience complications related to bowel motility Outcome: Progressing Goal: Will not experience complications related to urinary retention Outcome: Progressing   Problem: Pain Managment: Goal: General experience of comfort will improve Outcome: Progressing   Problem: Safety: Goal: Ability to remain free from injury will improve Outcome: Progressing   Problem: Skin Integrity: Goal: Risk for impaired skin integrity will decrease Outcome: Progressing   Problem: Education: Goal: Required Educational Video(s) Outcome: Progressing

## 2018-12-03 NOTE — [place] FL2 (Signed)
[place] MEDICAID FL2 LEVEL OF CARE SCREENING TOOL     IDENTIFICATION  Patient Name: [name] Birthdate: 10/21/1976 Sex: male Admission Date (Current Location): 12/01/2018  [facility] and [place] Number:  [address] ([id number]) Facility and Address:  [facility], [address], [place], [place] [address]      Provider Number: [id number]  Attending Physician Name and Address:  Juanell Fairly, MD  Relative Name and Phone Number:       Current Level of Care: Hospital Recommended Level of Care: Other (Comment)(Group Home ) Prior Approval Number:    Date Approved/Denied:   PASRR Number: [id number])  Discharge Plan: Domiciliary (Rest home)    Current Diagnoses: Patient Active Problem List   Diagnosis Date Noted  . Cellulitis of left lower extremity   . Cellulitis of left foot 03/07/2018  . COPD (chronic obstructive pulmonary disease) (HCC) 05/03/2017  . Hyponatremia 06/27/2016  . GERD (gastroesophageal reflux disease) 11/11/2015  . Borderline personality disorder (HCC) 11/11/2015  . Alcohol use disorder, severe, in controlled environment (HCC) 11/11/2015  . 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 Tobacco abuse 11/05/2013    Orientation RESPIRATION BLADDER Height & Weight     Self, Situation, Place  Normal Continent Weight:   Height:     BEHAVIORAL SYMPTOMS/MOOD NEUROLOGICAL BOWEL NUTRITION STATUS      Continent Diet(Diet: Regular )  AMBULATORY STATUS COMMUNICATION OF NEEDS Skin   Limited Assist Verbally Surgical wounds                       Personal Care Assistance Level of Assistance  Bathing, Feeding, Dressing Bathing Assistance: Limited assistance Feeding assistance: Independent Dressing Assistance: Limited assistance     Functional Limitations Info  Sight, Hearing, Speech Sight Info: Adequate Hearing Info:  Adequate Speech Info: Adequate    SPECIAL CARE FACTORS FREQUENCY  PT (By licensed PT)     PT Frequency: (2-3 home health )              Contractures      Additional Factors Info  Code Status, Allergies Code Status Info: (Full Code. ) Allergies Info: (Morphine And Related, Penicillins)           Current Medications (12/03/2018):  This is the current hospital active medication list Current Facility-Administered Medications  Medication Dose Route Frequency Provider Last Rate Last Dose  . 0.9 %  sodium chloride infusion   Intravenous Continuous Juanell Fairly, MD 75 mL/hr at 12/01/18 1433    . 0.9 % NaCl with KCl 20 mEq/ L  infusion   Intravenous Continuous Juanell Fairly, MD 75 mL/hr at 12/01/18 2147    . acetaminophen (TYLENOL) tablet 325-650 mg  325-650 mg Oral Q6H PRN Juanell Fairly, MD      . albuterol (PROVENTIL) (2.5 MG/3ML) 0.083% nebulizer solution 3 mL  3 mL Inhalation Q6H PRN Juanell Fairly, MD      . alum & mag hydroxide-simeth (MAALOX/MYLANTA) 200-200-20 MG/5ML suspension 30 mL  30 mL Oral Q4H PRN Juanell Fairly, MD   30 mL at 12/03/18 1126  . bisacodyl (DULCOLAX) suppository 10 mg  10 mg Rectal Daily PRN Juanell Fairly, MD      . cholecalciferol (VITAMIN D3) tablet 2,000 Units  2,000 Units Oral Daily Juanell Fairly, MD   2,000 Units at 12/03/18 [phone]  . cyclobenzaprine (FLEXERIL) tablet 10 mg  10 mg Oral Q12H PRN Juanell Fairly, MD  10 mg at 12/03/18 0926  . disulfiram (ANTABUSE) tablet 250 mg  250 mg Oral Daily Juanell FairlyKrasinski, Kevin, MD   250 mg at 12/03/18 0926  . divalproex (DEPAKOTE ER) 24 hr tablet 2,000 mg  2,000 mg Oral QHS Juanell FairlyKrasinski, Kevin, MD   2,000 mg at 12/02/18 2226  . docusate sodium (COLACE) capsule 100 mg  100 mg Oral BID Juanell FairlyKrasinski, Kevin, MD   100 mg at 12/03/18 0926  . enoxaparin (LOVENOX) injection 40 mg  40 mg Subcutaneous Q24H Juanell FairlyKrasinski, Kevin, MD   40 mg at 12/02/18 0934  . escitalopram (LEXAPRO) tablet 20 mg  20 mg Oral Dolores PattyBH-q7a  Krasinski, Kevin, MD   20 mg at 12/03/18 0930  . feeding supplement (ENSURE ENLIVE) (ENSURE ENLIVE) liquid 237 mL  237 mL Oral BID BM Juanell FairlyKrasinski, Kevin, MD   237 mL at 12/03/18 0932  . fentaNYL (SUBLIMAZE) injection 50 mcg  50 mcg Intravenous Once Juanell FairlyKrasinski, Kevin, MD      . ferrous sulfate tablet 325 mg  325 mg Oral BID WC Juanell FairlyKrasinski, Kevin, MD   325 mg at 12/03/18 0926  . folic acid (FOLVITE) tablet 1 mg  1 mg Oral Daily Juanell FairlyKrasinski, Kevin, MD   1 mg at 12/03/18 [id number]  . furosemide (LASIX) tablet 20 mg  20 mg Oral Daily Juanell FairlyKrasinski, Kevin, MD   20 mg at 12/03/18 [id number]  . gabapentin (NEURONTIN) capsule 300 mg  300 mg Oral TID Juanell FairlyKrasinski, Kevin, MD   300 mg at 12/03/18 0926  . HYDROmorphone (DILAUDID) injection 1 mg  1 mg Intravenous Q3H PRN Juanell FairlyKrasinski, Kevin, MD   1 mg at 12/02/18 2320  . hydrOXYzine (ATARAX/VISTARIL) tablet 50 mg  50 mg Oral TID Juanell FairlyKrasinski, Kevin, MD   50 mg at 12/03/18 0926  . lactulose (CHRONULAC) 10 GM/15ML solution 30 g  30 g Oral BID Juanell FairlyKrasinski, Kevin, MD   30 g at 12/02/18 0935  . LORazepam (ATIVAN) tablet 1 mg  1 mg Oral Q6H PRN Juanell FairlyKrasinski, Kevin, MD       Or  . LORazepam (ATIVAN) injection 1 mg  1 mg Intravenous Q6H PRN Juanell FairlyKrasinski, Kevin, MD      . LORazepam (ATIVAN) tablet 0-4 mg  0-4 mg Oral Q6H Juanell FairlyKrasinski, Kevin, MD       Followed by  . [START ON 12/04/2018] LORazepam (ATIVAN) tablet 0-4 mg  0-4 mg Oral Q12H Juanell FairlyKrasinski, Kevin, MD      . Melatonin TABS 10 mg  10 mg Oral QHS Juanell FairlyKrasinski, Kevin, MD   10 mg at 12/02/18 2225  . mometasone-formoterol (DULERA) 200-5 MCG/ACT inhaler 2 puff  2 puff Inhalation BID Juanell FairlyKrasinski, Kevin, MD   2 puff at 12/03/18 0830  . multivitamin with minerals tablet 1 tablet  1 tablet Oral Daily Juanell FairlyKrasinski, Kevin, MD   1 tablet at 12/03/18 0926  . ondansetron (ZOFRAN) tablet 4 mg  4 mg Oral Q6H PRN Juanell FairlyKrasinski, Kevin, MD       Or  . ondansetron [facility](ZOFRAN) injection 4 mg  4 mg Intravenous Q6H PRN Juanell FairlyKrasinski, Kevin, MD   4 mg at 12/02/18 0725  . oxyCODONE (Oxy  IR/ROXICODONE) immediate release tablet 10-15 mg  10-15 mg Oral Q3H PRN Juanell FairlyKrasinski, Kevin, MD   15 mg at 12/03/18 0927  . oxyCODONE (Oxy IR/ROXICODONE) immediate release tablet 5-10 mg  5-10 mg Oral Q3H PRN Juanell FairlyKrasinski, Kevin, MD   10 mg at 12/03/18 1221  . pantoprazole (PROTONIX) EC tablet 40 mg  40 mg Oral QAC breakfast Juanell FairlyKrasinski, Kevin, MD   [phone]  mg at 12/03/18 0926  . polyethylene glycol (MIRALAX / GLYCOLAX) packet 17 g  17 g Oral Daily PRN Juanell FairlyKrasinski, Kevin, MD      . primidone (MYSOLINE) tablet 250 mg  250 mg Oral TID Juanell FairlyKrasinski, Kevin, MD   250 mg at 12/03/18 0932  . primidone (MYSOLINE) tablet 50 mg  50 mg Oral TID Juanell FairlyKrasinski, Kevin, MD   50 mg at 12/03/18 0926  . QUEtiapine (SEROQUEL) tablet 200 mg  200 mg Oral QHS Juanell FairlyKrasinski, Kevin, MD   200 mg at 12/02/18 2225  . thiamine (VITAMIN B-1) tablet 100 mg  100 mg Oral Daily Juanell FairlyKrasinski, Kevin, MD   100 mg at 12/03/18 [id number]   Or  . thiamine (B-1) injection 100 mg  100 mg Intravenous Daily Juanell FairlyKrasinski, Kevin, MD      . traMADol Janean Sark(ULTRAM) tablet 50 mg  50 mg Oral Q6H Juanell FairlyKrasinski, Kevin, MD   50 mg at 12/02/18 1803     Discharge Medications: Please see discharge summary for a list of discharge medications.  Relevant Imaging Results:  Relevant Lab Results:   Additional Information (SSN: [id number])  Tameia Rafferty, Darleen CrockerBailey M, LCSW

## 2018-12-04 ENCOUNTER — Inpatient Hospital Stay (HOSPITAL_COMMUNITY): Admission: RE | Admit: 2018-12-04 | Payer: Medicare HMO | Source: Home / Self Care | Admitting: Student

## 2018-12-04 LAB — CBC
HCT: 38.3 % — ABNORMAL LOW (ref 39.0–52.0)
Hemoglobin: 13.3 g/dL (ref 13.0–17.0)
MCH: 32.4 pg (ref 26.0–34.0)
MCHC: 34.7 g/dL (ref 30.0–36.0)
MCV: 93.4 fL (ref 80.0–100.0)
Platelets: 170 10*3/uL (ref 150–400)
RBC: 4.1 MIL/uL — ABNORMAL LOW (ref 4.22–5.81)
RDW: 13.2 % (ref 11.5–15.5)
WBC: 8.7 10*3/uL (ref 4.0–10.5)
nRBC: 0 % (ref 0.0–0.2)

## 2018-12-04 LAB — SURGICAL PCR SCREEN
MRSA, PCR: NEGATIVE
STAPHYLOCOCCUS AUREUS: NEGATIVE

## 2018-12-04 LAB — HIV ANTIBODY (ROUTINE TESTING W REFLEX): HIV Screen 4th Generation wRfx: NONREACTIVE

## 2018-12-04 MED ORDER — FERROUS SULFATE 325 (65 FE) MG PO TABS
325.0000 mg | ORAL_TABLET | Freq: Two times a day (BID) | ORAL | Status: DC
  Administered 2018-12-04 – 2018-12-13 (×17): 325 mg via ORAL
  Filled 2018-12-04 (×17): qty 1

## 2018-12-04 MED ORDER — MELATONIN 3 MG PO TABS
9.0000 mg | ORAL_TABLET | Freq: Every day | ORAL | Status: DC
  Administered 2018-12-04 – 2018-12-12 (×6): 9 mg via ORAL
  Filled 2018-12-04 (×9): qty 3

## 2018-12-04 MED ORDER — VITAMIN D 25 MCG (1000 UNIT) PO TABS
2000.0000 [IU] | ORAL_TABLET | Freq: Every day | ORAL | Status: DC
  Administered 2018-12-04 – 2018-12-13 (×9): 2000 [IU] via ORAL
  Filled 2018-12-04 (×9): qty 2

## 2018-12-04 MED ORDER — DOCUSATE SODIUM 100 MG PO CAPS
100.0000 mg | ORAL_CAPSULE | Freq: Two times a day (BID) | ORAL | Status: DC
  Administered 2018-12-04 – 2018-12-13 (×18): 100 mg via ORAL
  Filled 2018-12-04 (×18): qty 1

## 2018-12-04 MED ORDER — QUETIAPINE FUMARATE 400 MG PO TABS
200.0000 mg | ORAL_TABLET | Freq: Every day | ORAL | Status: DC
  Administered 2018-12-04 – 2018-12-12 (×9): 200 mg via ORAL
  Filled 2018-12-04 (×9): qty 1

## 2018-12-04 MED ORDER — PRIMIDONE 250 MG PO TABS
250.0000 mg | ORAL_TABLET | Freq: Three times a day (TID) | ORAL | Status: DC
  Administered 2018-12-04 – 2018-12-13 (×26): 250 mg via ORAL
  Filled 2018-12-04 (×28): qty 1

## 2018-12-04 MED ORDER — ENSURE ENLIVE PO LIQD
237.0000 mL | Freq: Two times a day (BID) | ORAL | Status: DC
  Administered 2018-12-04 – 2018-12-13 (×17): 237 mL via ORAL

## 2018-12-04 MED ORDER — FUROSEMIDE 20 MG PO TABS
20.0000 mg | ORAL_TABLET | Freq: Every day | ORAL | Status: DC
  Administered 2018-12-04 – 2018-12-13 (×9): 20 mg via ORAL
  Filled 2018-12-04 (×9): qty 1

## 2018-12-04 MED ORDER — PANTOPRAZOLE SODIUM 40 MG PO TBEC
40.0000 mg | DELAYED_RELEASE_TABLET | Freq: Every day | ORAL | Status: DC
  Administered 2018-12-04 – 2018-12-13 (×9): 40 mg via ORAL
  Filled 2018-12-04 (×9): qty 1

## 2018-12-04 MED ORDER — ENOXAPARIN SODIUM 40 MG/0.4ML ~~LOC~~ SOLN
40.0000 mg | SUBCUTANEOUS | Status: DC
  Administered 2018-12-04 – 2018-12-05 (×2): 40 mg via SUBCUTANEOUS
  Filled 2018-12-04 (×2): qty 0.4

## 2018-12-04 MED ORDER — ESCITALOPRAM OXALATE 10 MG PO TABS
20.0000 mg | ORAL_TABLET | Freq: Every day | ORAL | Status: DC
  Administered 2018-12-04 – 2018-12-13 (×9): 20 mg via ORAL
  Filled 2018-12-04 (×9): qty 2

## 2018-12-04 MED ORDER — ALBUTEROL SULFATE (2.5 MG/3ML) 0.083% IN NEBU: 3.0000 mL | INHALATION_SOLUTION | Freq: Four times a day (QID) | RESPIRATORY_TRACT | Status: DC | PRN

## 2018-12-04 MED ORDER — DIVALPROEX SODIUM ER 500 MG PO TB24
2000.0000 mg | ORAL_TABLET | Freq: Every day | ORAL | Status: DC
  Administered 2018-12-04 – 2018-12-12 (×9): 2000 mg via ORAL
  Filled 2018-12-04 (×9): qty 4

## 2018-12-04 MED ORDER — OXYCODONE HCL 5 MG PO TABS
15.0000 mg | ORAL_TABLET | ORAL | Status: DC | PRN
  Administered 2018-12-04 – 2018-12-08 (×17): 15 mg via ORAL
  Filled 2018-12-04 (×18): qty 3

## 2018-12-04 MED ORDER — PRIMIDONE 50 MG PO TABS
50.0000 mg | ORAL_TABLET | Freq: Three times a day (TID) | ORAL | Status: DC
  Administered 2018-12-04 – 2018-12-13 (×26): 50 mg via ORAL
  Filled 2018-12-04 (×28): qty 1

## 2018-12-04 MED ORDER — FLUTICASONE FUROATE-VILANTEROL 200-25 MCG/INH IN AEPB
1.0000 | INHALATION_SPRAY | Freq: Every day | RESPIRATORY_TRACT | Status: DC
  Administered 2018-12-04: 1 via RESPIRATORY_TRACT
  Filled 2018-12-04: qty 28

## 2018-12-04 MED ORDER — POLYETHYLENE GLYCOL 3350 17 G PO PACK
17.0000 g | PACK | Freq: Every day | ORAL | Status: DC
  Administered 2018-12-04 – 2018-12-13 (×9): 17 g via ORAL
  Filled 2018-12-04 (×9): qty 1

## 2018-12-04 MED ORDER — OXYCODONE HCL 5 MG PO TABS: 10.0000 mg | ORAL_TABLET | ORAL | Status: DC | PRN

## 2018-12-04 MED ORDER — METHOCARBAMOL 750 MG PO TABS
750.0000 mg | ORAL_TABLET | Freq: Four times a day (QID) | ORAL | Status: DC | PRN
  Administered 2018-12-04 – 2018-12-13 (×22): 750 mg via ORAL
  Filled 2018-12-04 (×22): qty 1

## 2018-12-04 MED ORDER — GABAPENTIN 300 MG PO CAPS
300.0000 mg | ORAL_CAPSULE | Freq: Three times a day (TID) | ORAL | Status: DC
  Administered 2018-12-07 – 2018-12-13 (×17): 300 mg via ORAL
  Filled 2018-12-04 (×24): qty 1
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
## 2018-12-04 NOTE — Evaluation (Addendum)
Physical Therapy Evaluation Patient Details Name: [name] MRN: [id number] DOB: October 16, 1976 Today's Date: 12/04/2018   History of Present Illness  43 year old male with a complex right tibial plateau fracture status post external fixation, NWB; Noted plan for back to OR Thursday, 1/16. PMH including HTN, Borderline personality disorder, depression, and seizures.  Clinical Impression   Patient is s/p above surgery resulting in functional limitations due to the deficits listed below (see PT Problem List). Independent with simple mobiltiy and ADLs prior to admission; Presents with unsteadiness with amb, dependent On RW for amb keepeing NWB RLE due to unsteadiness;  Noting plan for OR Thursday; will follow, and help discern RW versus crutches postop; Patient will benefit from skilled PT to increase their independence and safety with mobility to allow discharge to the venue listed below.       Follow Up Recommendations Home health PT;Supervision - Intermittent    Equipment Recommendations  Rolling walker with 5" wheels;3in1 (PT);Other (comment)(Will consider crutches postop)    Recommendations for Other Services       Precautions / Restrictions Precautions Precautions: Fall Restrictions Weight Bearing Restrictions: Yes RLE Weight Bearing: Non weight bearing      Mobility  Bed Mobility Overal bed mobility: Needs Assistance Bed Mobility: Supine to Sit     Supine to sit: Min guard     General bed mobility comments: Min Guard A for safety  Transfers Overall transfer level: Needs assistance Equipment used: Rolling walker (2 wheeled) Transfers: Sit to/from Stand Sit to Stand: Min assist         General transfer comment: Min A for power up into standing and then gain balance once on LLE. Pt maintaining NWB throughout  Ambulation/Gait Ambulation/Gait assistance: Min assist Gait Distance (Feet): 5 Feet Assistive device: Rolling walker (2 wheeled) Gait Pattern/deviations:  ("hop-to")     General Gait Details: demo cues for technqiue; small steps, but good NWB RLE; a few losses of balance, needing min assist to steady  Careers information officer    Modified Rankin (Stroke Patients Only)       Balance Overall balance assessment: Needs assistance Sitting-balance support: No upper extremity supported;Feet supported Sitting balance-Leahy Scale: Fair     Standing balance support: Bilateral upper extremity supported;During functional activity Standing balance-Leahy Scale: Poor Standing balance comment: Reliant on UE support                             Pertinent Vitals/Pain Pain Assessment: Faces Faces Pain Scale: Hurts little more Pain Location: Right LE Pain Descriptors / Indicators: Constant;Discomfort;Grimacing Pain Intervention(s): Monitored during session;Premedicated before session;Repositioned    Home Living Family/patient expects to be discharged to:: Group home Living Arrangements: Group Home   Type of Home: Assisted living Home Access: Ramped entrance;Stairs to enter Entrance Stairs-Rails: Right Entrance Stairs-Number of Steps: 5 Home Layout: One level Home Equipment: Walker - 2 wheels;Grab bars - tub/shower      Prior Function Level of Independence: Needs assistance      ADL's / Homemaking Assistance Needed: Performs ADLs. Group home assists with IADLs        Hand Dominance   Dominant Hand: Right    Extremity/Trunk Assessment   Upper Extremity Assessment Upper Extremity Assessment: Overall WFL for tasks assessed    Lower Extremity Assessment Lower Extremity Assessment: RLE deficits/detail RLE Deficits / Details: right tibial plateau fracture status post external fixation; knee  fixed in full extension; hip motion WFL; noted decr dorsiflexion R foot, can almost get to neutral passively RLE: Unable to fully assess due to pain;Unable to fully assess due to immobilization RLE Coordination:  decreased gross motor;decreased fine motor    Cervical / Trunk Assessment Cervical / Trunk Assessment: Normal  Communication   Communication: No difficulties  Cognition Arousal/Alertness: Awake/alert Behavior During Therapy: WFL for tasks assessed/performed Overall Cognitive Status: Within Functional Limits for tasks assessed                                        General Comments      Exercises     Assessment/Plan    PT Assessment Patient needs continued PT services  PT Problem List Decreased strength;Decreased range of motion;Decreased activity tolerance;Decreased balance;Decreased mobility;Decreased knowledge of use of DME;Decreased safety awareness;Decreased knowledge of precautions;Pain       PT Treatment Interventions DME instruction;Gait training;Stair training;Functional mobility training;Therapeutic activities;Therapeutic exercise;Balance training;Patient/family education;Wheelchair mobility training    PT Goals (Current goals can be found in the Care Plan section)  Acute Rehab PT Goals Patient Stated Goal: "Walk again" PT Goal Formulation: With patient Time For Goal Achievement: 12/18/18 Potential to Achieve Goals: Good    Frequency Min 3X/week(Will likely incr freq post Thursday surgery)   Barriers to discharge        Co-evaluation PT/OT/SLP Co-Evaluation/Treatment: Yes Reason for Co-Treatment: Other (comment)(Difficulty getting pain under control per RN)           AM-PAC PT "6 Clicks" Mobility  Outcome Measure Help needed turning from your back to your side while in a flat bed without using bedrails?: None Help needed moving from lying on your back to sitting on the side of a flat bed without using bedrails?: A Little Help needed moving to and from a bed to a chair (including a wheelchair)?: A Little Help needed standing up from a chair using your arms (e.g., wheelchair or bedside chair)?: A Little Help needed to walk in hospital  room?: A Little Help needed climbing 3-5 steps with a railing? : A Lot 6 Click Score: 18    End of Session   Activity Tolerance: Patient tolerated treatment well;Other (comment)(Despite lots of reports of pain) Patient left: in chair;with call bell/phone within reach Nurse Communication: Mobility status PT Visit Diagnosis: Unsteadiness on feet (R26.81);Other abnormalities of gait and mobility (R26.89);Pain Pain - Right/Left: Right Pain - part of body: Leg    Time: 2334-3568 PT Time Calculation (min) (ACUTE ONLY): 12 min   Charges:   PT Evaluation $PT Eval Low Complexity: 1 Low          Van Clines, PT  Acute Rehabilitation Services Pager [phone] Office [phone]   Levi Aland 12/04/2018, 4:41 PM

## 2018-12-04 NOTE — Evaluation (Addendum)
Occupational Therapy Evaluation Patient Details Name: [name] MRN: [id number] DOB: 1976-08-10 Today's Date: 12/04/2018    History of Present Illness 43 year old male with a complex right tibial plateau fracture status post external fixation, NWB; Noted plan for back to OR Thursday, 1/16. PMH including HTN, Borderline personality disorder, depression, and seizures.   Clinical Impression   PTA, pt was living at a group home and performed BADLs. Pt currently requiring Mod-Max A for LB ADLs and Min A for functional mobility with RW. Pt presenting with decreased balance but is able to maintain NWB status throughout ADLs and mobility. Pt planning for sx on Thursday to remove external fixator. Pt will require further acute OT to facilitate safe dc. Recommend dc to home once medically stable per physician.    Follow Up Recommendations  No OT follow up;Supervision/Assistance - 24 hour    Equipment Recommendations  3 in 1 bedside commode    Recommendations for Other Services PT consult     Precautions / Restrictions Precautions Precautions: Fall Restrictions Weight Bearing Restrictions: Yes RLE Weight Bearing: Non weight bearing      Mobility Bed Mobility Overal bed mobility: Needs Assistance Bed Mobility: Supine to Sit     Supine to sit: Min guard     General bed mobility comments: Min Guard A for safety  Transfers Overall transfer level: Needs assistance Equipment used: Rolling walker (2 wheeled) Transfers: Sit to/from Stand Sit to Stand: Min assist         General transfer comment: Min A for power up into standing and then gain balance once on LLE. Pt maintaining NWB throughout    Balance Overall balance assessment: Needs assistance Sitting-balance support: No upper extremity supported;Feet supported Sitting balance-Leahy Scale: Fair     Standing balance support: Bilateral upper extremity supported;During functional activity Standing balance-Leahy Scale:  Poor Standing balance comment: Reliant on UE support                           ADL either performed or assessed with clinical judgement   ADL Overall ADL's : Needs assistance/impaired Eating/Feeding: Independent;Sitting   Grooming: Oral care;Set up;Sitting;Brushing hair   Upper Body Bathing: Set up;Sitting;Supervision/ safety   Lower Body Bathing: Moderate assistance;Sit to/from stand   Upper Body Dressing : Set up;Supervision/safety;Sitting   Lower Body Dressing: Maximal assistance;Sit to/from stand   Toilet Transfer: Minimal assistance;Ambulation;RW(Simulated to recliner) Toilet Transfer Details (indicate cue type and reason): Min A for power up and to gain balance. Discussed use of BSC over toilet for home          Functional mobility during ADLs: Minimal assistance;Rolling walker General ADL Comments: Pt presenting with decreased balance.     Vision         Perception     Praxis      Pertinent Vitals/Pain Pain Assessment: Faces Faces Pain Scale: Hurts little more Pain Location: Right LE Pain Descriptors / Indicators: Constant;Discomfort;Grimacing Pain Intervention(s): Monitored during session;Limited activity within patient's tolerance;Repositioned     Hand Dominance Right   Extremity/Trunk Assessment Upper Extremity Assessment Upper Extremity Assessment: Overall WFL for tasks assessed   Lower Extremity Assessment Lower Extremity Assessment: Defer to PT evaluation;RLE deficits/detail RLE Deficits / Details: right tibial plateau fracture status post external fixation RLE: Unable to fully assess due to pain;Unable to fully assess due to immobilization RLE Coordination: decreased gross motor;decreased fine motor   Cervical / Trunk Assessment Cervical / Trunk Assessment: Normal   Communication Communication  Communication: No difficulties   Cognition Arousal/Alertness: Awake/alert Behavior During Therapy: WFL for tasks  assessed/performed Overall Cognitive Status: Within Functional Limits for tasks assessed                                     General Comments       Exercises     Shoulder Instructions      Home Living Family/patient expects to be discharged to:: Group home Living Arrangements: Group Home   Type of Home: Assisted living Home Access: Ramped entrance;Stairs to enter     Home Layout: One level     Bathroom Shower/Tub: Chief Strategy Officer: Standard     Home Equipment: Environmental consultant - 2 wheels;Grab bars - tub/shower          Prior Functioning/Environment Level of Independence: Needs assistance    ADL's / Homemaking Assistance Needed: Performs ADLs. Group home assists with IADLs            OT Problem List: Decreased strength;Decreased range of motion;Decreased activity tolerance;Impaired balance (sitting and/or standing);Decreased knowledge of use of DME or AE;Decreased knowledge of precautions;Pain      OT Treatment/Interventions: Self-care/ADL training;Therapeutic exercise;Energy conservation;DME and/or AE instruction;Therapeutic activities;Patient/family education    OT Goals(Current goals can be found in the care plan section) Acute Rehab OT Goals Patient Stated Goal: "Walk again" OT Goal Formulation: With patient Time For Goal Achievement: 12/18/18 Potential to Achieve Goals: Good  OT Frequency: Min 3X/week   Barriers to D/C:            Co-evaluation PT/OT/SLP Co-Evaluation/Treatment: (Dove tail)            AM-PAC OT "6 Clicks" Daily Activity     Outcome Measure Help from another person eating meals?: None Help from another person taking care of personal grooming?: A Little Help from another person toileting, which includes using toliet, bedpan, or urinal?: A Little Help from another person bathing (including washing, rinsing, drying)?: A Lot Help from another person to put on and taking off regular upper body clothing?:  None Help from another person to put on and taking off regular lower body clothing?: A Lot 6 Click Score: 18   End of Session Equipment Utilized During Treatment: Gait belt;Rolling walker Nurse Communication: Mobility status  Activity Tolerance: Patient tolerated treatment well;Patient limited by pain Patient left: in chair;with call bell/phone within reach  OT Visit Diagnosis: Unsteadiness on feet (R26.81);Other abnormalities of gait and mobility (R26.89);Muscle weakness (generalized) (M62.81);Pain Pain - Right/Left: Right Pain - part of body: Leg                Time: 8676-1950 OT Time Calculation (min): 13 min Charges:  OT General Charges $OT Visit: 1 Visit OT Evaluation $OT Eval Moderate Complexity: 1 Mod  [name] MSOT, OTR/L Acute Rehab Pager: [phone] Office: [phone]  Theodoro Grist [name] 12/04/2018, 4:25 PM
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
## 2018-12-04 NOTE — Plan of Care (Signed)
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
## 2018-12-05 MED ORDER — SODIUM CHLORIDE 0.9 % IV SOLN
8.0000 mg | Freq: Three times a day (TID) | INTRAVENOUS | Status: DC | PRN
  Filled 2018-12-05 (×2): qty 4

## 2018-12-05 MED ORDER — MAGNESIUM CITRATE PO SOLN
1.0000 | Freq: Once | ORAL | Status: AC
  Administered 2018-12-05: 1 via ORAL
  Filled 2018-12-05: qty 296

## 2018-12-05 MED ORDER — VALBENAZINE TOSYLATE 80 MG PO CAPS
80.0000 mg | ORAL_CAPSULE | Freq: Every day | ORAL | Status: DC
  Administered 2018-12-07 – 2018-12-13 (×7): 80 mg via ORAL
  Filled 2018-12-05 (×9): qty 1

## 2018-12-05 MED ORDER — VALBENAZINE TOSYLATE 40 MG PO CAPS
40.0000 mg | ORAL_CAPSULE | Freq: Every day | ORAL | Status: DC
  Administered 2018-12-05 – 2018-12-12 (×8): 40 mg via ORAL
  Filled 2018-12-05 (×9): qty 1

## 2018-12-05 MED ORDER — UMECLIDINIUM-VILANTEROL 62.5-25 MCG/INH IN AEPB
1.0000 | INHALATION_SPRAY | Freq: Every day | RESPIRATORY_TRACT | Status: DC
  Administered 2018-12-05 – 2018-12-13 (×9): 1 via RESPIRATORY_TRACT
  Filled 2018-12-05 (×3): qty 14

## 2018-12-05 MED ORDER — LACTULOSE 10 GM/15ML PO SOLN
30.0000 g | Freq: Every day | ORAL | Status: DC
  Administered 2018-12-05 – 2018-12-08 (×2): 30 g via ORAL
  Filled 2018-12-05 (×5): qty 45
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
## 2018-12-05 NOTE — Plan of Care (Signed)
  Problem: Pain Managment: Goal: General experience of comfort will improve Outcome: Progressing   Problem: Safety: Goal: Ability to remain free from injury will improve Outcome: Progressing   
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
## 2018-12-05 NOTE — Progress Notes (Signed)
Pt skin around first screw on his thigh is tearing. Will notify MD.

## 2018-12-05 NOTE — Progress Notes (Signed)
[name] called me back and stated that she was not his active Child psychotherapist at the moment and was not at the office but that she would reach out to stella that the group home to see what can be arranged.

## 2018-12-05 NOTE — Progress Notes (Signed)
Pt has a home medication "Ingreza" that we do not hold at the hospital. I called pt group home and spoke to [facility] to see if she would be able to drop off the medication and she stated she could not. She then gave me the number for his Guardian Nigel Bridgeman, I gave her a call and she did not answer so I left a message awaiting a call back. Notified pharmacy.  [name] number : [phone] Nigel Bridgeman (gaurdian) [phone]
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
## 2018-12-06 ENCOUNTER — Inpatient Hospital Stay (HOSPITAL_COMMUNITY): Payer: Medicare HMO

## 2018-12-06 ENCOUNTER — Inpatient Hospital Stay (HOSPITAL_COMMUNITY): Payer: Medicare HMO | Admitting: Anesthesiology

## 2018-12-06 ENCOUNTER — Encounter (HOSPITAL_COMMUNITY)
Admission: AD | Disposition: A | Discharge status: Skilled Nursing Facility | Payer: Self-pay | Source: Other Acute Inpatient Hospital | Attending: Student

## 2018-12-06 ENCOUNTER — Encounter (HOSPITAL_COMMUNITY): Payer: Self-pay | Admitting: Anesthesiology

## 2018-12-06 HISTORY — PX: ORIF TIBIA PLATEAU: SHX2132

## 2018-12-06 HISTORY — PX: EXTERNAL FIXATION REMOVAL: SHX5040

## 2018-12-06 LAB — CBC
HCT: 36.2 % — ABNORMAL LOW (ref 39.0–52.0)
Hemoglobin: 12.6 g/dL — ABNORMAL LOW (ref 13.0–17.0)
MCH: 32.3 pg (ref 26.0–34.0)
MCHC: 34.8 g/dL (ref 30.0–36.0)
MCV: 92.8 fL (ref 80.0–100.0)
Platelets: 183 10*3/uL (ref 150–400)
RBC: 3.9 MIL/uL — ABNORMAL LOW (ref 4.22–5.81)
RDW: 12.9 % (ref 11.5–15.5)
WBC: 4.7 10*3/uL (ref 4.0–10.5)
nRBC: 0 % (ref 0.0–0.2)

## 2018-12-06 SURGERY — OPEN REDUCTION INTERNAL FIXATION (ORIF) TIBIAL PLATEAU
Anesthesia: General | Site: Leg Lower | Laterality: Right

## 2018-12-06 MED ORDER — ONDANSETRON HCL 4 MG/2ML IJ SOLN
INTRAMUSCULAR | Status: DC | PRN
  Administered 2018-12-06: 4 mg via INTRAVENOUS

## 2018-12-06 MED ORDER — CEFAZOLIN SODIUM-DEXTROSE 2-4 GM/100ML-% IV SOLN
2.0000 g | Freq: Three times a day (TID) | INTRAVENOUS | Status: AC
  Administered 2018-12-06 – 2018-12-07 (×3): 2 g via INTRAVENOUS
  Filled 2018-12-06 (×3): qty 100

## 2018-12-06 MED ORDER — VANCOMYCIN HCL 1000 MG IV SOLR
INTRAVENOUS | Status: AC
  Filled 2018-12-06: qty 1000

## 2018-12-06 MED ORDER — LIDOCAINE 2% (20 MG/ML) 5 ML SYRINGE
INTRAMUSCULAR | Status: DC | PRN
  Administered 2018-12-06: 60 mg via INTRAVENOUS

## 2018-12-06 MED ORDER — LACTATED RINGERS IV SOLN
INTRAVENOUS | Status: DC
  Administered 2018-12-06 (×2): via INTRAVENOUS

## 2018-12-06 MED ORDER — VANCOMYCIN HCL 1000 MG IV SOLR
INTRAVENOUS | Status: DC | PRN
  Administered 2018-12-06: 1000 mg via TOPICAL

## 2018-12-06 MED ORDER — PROPOFOL 10 MG/ML IV BOLUS
INTRAVENOUS | Status: DC | PRN
  Administered 2018-12-06: 200 mg via INTRAVENOUS

## 2018-12-06 MED ORDER — FENTANYL CITRATE (PF) 100 MCG/2ML IJ SOLN
INTRAMUSCULAR | Status: DC | PRN
  Administered 2018-12-06: 100 ug via INTRAVENOUS
  Administered 2018-12-06 (×3): 50 ug via INTRAVENOUS
  Administered 2018-12-06: 100 ug via INTRAVENOUS
  Administered 2018-12-06 (×3): 50 ug via INTRAVENOUS

## 2018-12-06 MED ORDER — ROCURONIUM BROMIDE 10 MG/ML (PF) SYRINGE
PREFILLED_SYRINGE | INTRAVENOUS | Status: DC | PRN
  Administered 2018-12-06: 70 mg via INTRAVENOUS
  Administered 2018-12-06: 30 mg via INTRAVENOUS

## 2018-12-06 MED ORDER — TOBRAMYCIN SULFATE 1.2 G IJ SOLR
INTRAMUSCULAR | Status: AC
  Filled 2018-12-06: qty 1.2

## 2018-12-06 MED ORDER — MIDAZOLAM HCL 5 MG/5ML IJ SOLN
INTRAMUSCULAR | Status: DC | PRN
  Administered 2018-12-06: 2 mg via INTRAVENOUS

## 2018-12-06 MED ORDER — BACITRACIN 500 UNIT/GM EX OINT
TOPICAL_OINTMENT | CUTANEOUS | Status: DC | PRN
  Administered 2018-12-06: 1 via TOPICAL

## 2018-12-06 MED ORDER — DEXMEDETOMIDINE HCL IN NACL 200 MCG/50ML IV SOLN
INTRAVENOUS | Status: DC | PRN
  Administered 2018-12-06: 8 ug via INTRAVENOUS
  Administered 2018-12-06: 12 ug via INTRAVENOUS

## 2018-12-06 MED ORDER — TOBRAMYCIN POWD
Status: DC | PRN
  Administered 2018-12-06: 1200 mg via TOPICAL

## 2018-12-06 MED ORDER — SUGAMMADEX SODIUM 200 MG/2ML IV SOLN
INTRAVENOUS | Status: DC | PRN
  Administered 2018-12-06: 161.4 mg via INTRAVENOUS

## 2018-12-06 MED ORDER — HYDROMORPHONE HCL 1 MG/ML IJ SOLN: 0.2500 mg | INTRAMUSCULAR | Status: DC | PRN

## 2018-12-06 MED ORDER — CEFAZOLIN SODIUM-DEXTROSE 1-4 GM/50ML-% IV SOLN
INTRAVENOUS | Status: DC | PRN
  Administered 2018-12-06: 2 g via INTRAVENOUS

## 2018-12-06 MED ORDER — DEXAMETHASONE SODIUM PHOSPHATE 4 MG/ML IJ SOLN
INTRAMUSCULAR | Status: DC | PRN
  Administered 2018-12-06: 5 mg via INTRAVENOUS

## 2018-12-06 MED ORDER — 0.9 % SODIUM CHLORIDE (POUR BTL) OPTIME
TOPICAL | Status: DC | PRN
  Administered 2018-12-06: 1000 mL

## 2018-12-06 MED ORDER — ENOXAPARIN SODIUM 40 MG/0.4ML ~~LOC~~ SOLN
40.0000 mg | SUBCUTANEOUS | Status: DC
  Administered 2018-12-07 – 2018-12-12 (×6): 40 mg via SUBCUTANEOUS
  Filled 2018-12-06 (×7): qty 0.4

## 2018-12-06 MED FILL — Tobramycin Sulfate For Inj 1.2 GM: INTRAMUSCULAR | Qty: 1.2 | Status: AC

## 2018-12-06 SURGICAL SUPPLY — 94 items
BANDAGE ACE 4X5 VEL STRL LF (GAUZE/BANDAGES/DRESSINGS) ×2 IMPLANT
BANDAGE ACE 6X5 VEL STRL LF (GAUZE/BANDAGES/DRESSINGS) ×2 IMPLANT
BANDAGE ESMARK 6X9 LF (GAUZE/BANDAGES/DRESSINGS) ×1 IMPLANT
BIT DRILL 2.5 X LONG (BIT) ×1
BIT DRILL CALIBR QC 2.8X250 (BIT) ×1 IMPLANT
BIT DRILL PERC QC 2.8X200 100 (BIT) IMPLANT
BIT DRILL QC 3.5X110 (BIT) ×1 IMPLANT
BIT DRILL X LONG 2.5 (BIT) IMPLANT
BLADE CLIPPER SURG (BLADE) IMPLANT
BLADE SURG 15 STRL LF DISP TIS (BLADE) ×1 IMPLANT
BLADE SURG 15 STRL SS (BLADE) ×1
BNDG ELASTIC 6X15 VLCR STRL LF (GAUZE/BANDAGES/DRESSINGS) ×1 IMPLANT
BNDG ESMARK 6X9 LF (GAUZE/BANDAGES/DRESSINGS) ×2
BNDG GAUZE ELAST 4 BULKY (GAUZE/BANDAGES/DRESSINGS) ×2 IMPLANT
BRUSH SCRUB SURG 4.25 DISP (MISCELLANEOUS) ×4 IMPLANT
CANISTER SUCT 3000ML PPV (MISCELLANEOUS) ×1 IMPLANT
CHLORAPREP W/TINT 26ML (MISCELLANEOUS) ×4 IMPLANT
COVER SURGICAL LIGHT HANDLE (MISCELLANEOUS) ×2 IMPLANT
COVER WAND RF STERILE (DRAPES) ×1 IMPLANT
CUFF TOURNIQUET SINGLE 34IN LL (TOURNIQUET CUFF) ×2 IMPLANT
DRAPE C-ARM 42X72 X-RAY (DRAPES) ×2 IMPLANT
DRAPE C-ARMOR (DRAPES) ×2 IMPLANT
DRAPE ORTHO SPLIT 77X108 STRL (DRAPES) ×2
DRAPE SURG ORHT 6 SPLT 77X108 (DRAPES) ×2 IMPLANT
DRAPE U-SHAPE 47X51 STRL (DRAPES) ×2 IMPLANT
DRILL BIT QUICK COUP 2.8MM 100 (BIT) ×1
DRILL BIT X LONG 2.5 (BIT) ×1
DRSG ADAPTIC 3X8 NADH LF (GAUZE/BANDAGES/DRESSINGS) ×1 IMPLANT
DRSG PAD ABDOMINAL 8X10 ST (GAUZE/BANDAGES/DRESSINGS) ×4 IMPLANT
ELECT REM PT RETURN 9FT ADLT (ELECTROSURGICAL) ×2
ELECTRODE REM PT RTRN 9FT ADLT (ELECTROSURGICAL) ×1 IMPLANT
GAUZE SPONGE 4X4 12PLY STRL (GAUZE/BANDAGES/DRESSINGS) ×2 IMPLANT
GLOVE BIO SURGEON STRL SZ 6.5 (GLOVE) ×6 IMPLANT
GLOVE BIO SURGEON STRL SZ7.5 (GLOVE) ×8 IMPLANT
GLOVE BIOGEL PI IND STRL 6.5 (GLOVE) ×1 IMPLANT
GLOVE BIOGEL PI IND STRL 7.5 (GLOVE) ×1 IMPLANT
GLOVE BIOGEL PI INDICATOR 6.5 (GLOVE) ×2
GLOVE BIOGEL PI INDICATOR 7.5 (GLOVE) ×1
GLOVE SURG SS PI 6.5 STRL IVOR (GLOVE) ×3 IMPLANT
GOWN STRL REUS W/ TWL LRG LVL3 (GOWN DISPOSABLE) ×2 IMPLANT
GOWN STRL REUS W/TWL LRG LVL3 (GOWN DISPOSABLE) ×2
IMMOBILIZER KNEE 22 (SOFTGOODS) ×1 IMPLANT
IMMOBILIZER KNEE 22 UNIV (SOFTGOODS) ×2 IMPLANT
K-WIRE 1.6 (WIRE) ×1
K-WIRE 1.6X150 (WIRE) ×2
K-WIRE FX5X1.6XNS BN SS (WIRE) ×1
KIT BASIN OR (CUSTOM PROCEDURE TRAY) ×2 IMPLANT
KIT TURNOVER KIT B (KITS) ×2 IMPLANT
KWIRE 1.6X150 (WIRE) IMPLANT
KWIRE FX5X1.6XNS BN SS (WIRE) IMPLANT
NDL SUT 6 .5 CRC .975X.05 MAYO (NEEDLE) ×1 IMPLANT
NEEDLE MAYO TAPER (NEEDLE) ×1
NS IRRIG 1000ML POUR BTL (IV SOLUTION) ×2 IMPLANT
PACK TOTAL JOINT (CUSTOM PROCEDURE TRAY) ×2 IMPLANT
PAD ABD 7.5X8 STRL (GAUZE/BANDAGES/DRESSINGS) ×1 IMPLANT
PAD ARMBOARD 7.5X6 YLW CONV (MISCELLANEOUS) ×4 IMPLANT
PAD CAST 3X4 CTTN HI CHSV (CAST SUPPLIES) IMPLANT
PAD CAST 4YDX4 CTTN HI CHSV (CAST SUPPLIES) ×1 IMPLANT
PADDING CAST COTTON 3X4 STRL (CAST SUPPLIES) ×1
PADDING CAST COTTON 4X4 STRL (CAST SUPPLIES) ×1
PADDING CAST COTTON 6X4 STRL (CAST SUPPLIES) ×2 IMPLANT
PLATE LCP 3.5 7H 98 (Plate) ×1 IMPLANT
SCREW CORTEX 3.5 32MM (Screw) ×1 IMPLANT
SCREW CORTEX 3.5 34MM (Screw) ×3 IMPLANT
SCREW CORTEX 3.5 38MM (Screw) ×2 IMPLANT
SCREW HEADED ST 3.5X56 (Screw) ×1 IMPLANT
SCREW HEADED ST 3.5X66 (Screw) ×1 IMPLANT
SCREW HEADED ST 3.5X70 (Screw) ×1 IMPLANT
SCREW HEADED ST 3.5X85 (Screw) ×1 IMPLANT
SCREW LOCK CORT ST 3.5X32 (Screw) IMPLANT
SCREW LOCK CORT ST 3.5X34 (Screw) IMPLANT
SCREW LOCK CORT ST 3.5X38 (Screw) IMPLANT
SCREW LOCK ST STARDR 3.5X46 (Screw) ×1 IMPLANT
SCREW LOCK ST STARDR 3.5X56 (Screw) ×1 IMPLANT
SCREW LOCKING 3.5X80MM VA (Screw) ×3 IMPLANT
SCREW LOCKING VA 3.5X75MM (Screw) ×1 IMPLANT
STAPLER VISISTAT 35W (STAPLE) ×2 IMPLANT
SUCTION FRAZIER HANDLE 10FR (MISCELLANEOUS) ×1
SUCTION TUBE FRAZIER 10FR DISP (MISCELLANEOUS) ×1 IMPLANT
SUT ETHILON 2 0 FS 18 (SUTURE) ×2 IMPLANT
SUT ETHILON 3 0 PS 1 (SUTURE) ×3 IMPLANT
SUT FIBERWIRE #2 38 T-5 BLUE (SUTURE)
SUT VIC AB 0 CT1 27 (SUTURE) ×3
SUT VIC AB 0 CT1 27XBRD ANBCTR (SUTURE) IMPLANT
SUT VIC AB 1 CT1 18XCR BRD 8 (SUTURE) IMPLANT
SUT VIC AB 1 CT1 27 (SUTURE) ×1
SUT VIC AB 1 CT1 27XBRD ANBCTR (SUTURE) ×1 IMPLANT
SUT VIC AB 1 CT1 8-18 (SUTURE) ×1
SUT VIC AB 2-0 CT1 27 (SUTURE) ×4
SUT VIC AB 2-0 CT1 TAPERPNT 27 (SUTURE) ×2 IMPLANT
SUTURE FIBERWR #2 38 T-5 BLUE (SUTURE) IMPLANT
TOWEL OR 17X26 10 PK STRL BLUE (TOWEL DISPOSABLE) ×4 IMPLANT
TRAY FOLEY MTR SLVR 16FR STAT (SET/KITS/TRAYS/PACK) IMPLANT
WATER STERILE IRR 1000ML POUR (IV SOLUTION) ×4 IMPLANT

## 2018-12-06 NOTE — Anesthesia Procedure Notes (Signed)
Procedure Name: Intubation Date/Time: 12/06/2018 11:39 AM Performed by: Caren Macadam, CRNA Pre-anesthesia Checklist: Patient identified, Emergency Drugs available, Suction available and Patient being monitored Patient Re-evaluated:Patient Re-evaluated prior to induction Oxygen Delivery Method: Circle system utilized Preoxygenation: Pre-oxygenation with 100% oxygen Induction Type: IV induction Ventilation: Mask ventilation without difficulty Laryngoscope Size: Miller and 2 Grade View: Grade I Tube type: Oral Tube size: 7.5 mm Number of attempts: 1 Airway Equipment and Method: Stylet and Oral airway Placement Confirmation: ETT inserted through vocal cords under direct vision,  positive ETCO2 and breath sounds checked- equal and bilateral Secured at: 21 cm Tube secured with: Tape Dental Injury: Teeth and Oropharynx as per pre-operative assessment

## 2018-12-06 NOTE — Transfer of Care (Signed)
Immediate Anesthesia Transfer of Care Note  Patient: [name]  Procedure(s) Performed: OPEN REDUCTION INTERNAL FIXATION (ORIF) TIBIAL PLATEAU (Right Leg Lower) REMOVAL EXTERNAL FIXATION LEG (Right Leg Lower)  Patient Location: PACU  Anesthesia Type:General  Level of Consciousness: awake, alert  and oriented  Airway & Oxygen Therapy: Patient Spontanous Breathing and Patient connected to nasal cannula oxygen  Post-op Assessment: Report given to RN and Post -op Vital signs reviewed and stable  Post vital signs: Reviewed and stable  Last Vitals:  Vitals Value Taken Time  BP 181/100 12/06/2018  3:21 PM  Temp    Pulse 83 12/06/2018  3:25 PM  Resp 14 12/06/2018  3:25 PM  SpO2 94 % 12/06/2018  3:25 PM  Vitals shown include unvalidated device data.  Last Pain:  Vitals:   12/06/18 1030  TempSrc:   PainSc: 9       Patients Stated Pain Goal: 2 (12/06/18 1030)  Complications: No apparent anesthesia complications

## 2018-12-06 NOTE — Progress Notes (Signed)
Report given to [place], Charity fundraiser.  The patient is ready for the OR

## 2018-12-06 NOTE — Interval H&P Note (Signed)
History and Physical Interval Note:  12/06/2018 10:52 AM  [name]  has presented today for surgery, with the diagnosis of Right tibial plateau fracture  The various methods of treatment have been discussed with the patient and family. After consideration of risks, benefits and other options for treatment, the patient has consented to  Procedure(s): OPEN REDUCTION INTERNAL FIXATION (ORIF) TIBIAL PLATEAU (Right) as a surgical intervention .  The patient's history has been reviewed, patient examined, no change in status, stable for surgery.  I have reviewed the patient's chart and labs.  Questions were answered to the patient's satisfaction.     Caryn Bee P [name]

## 2018-12-06 NOTE — Anesthesia Preprocedure Evaluation (Addendum)
Anesthesia Evaluation  Patient identified by MRN, date of birth, ID band Patient awake    Reviewed: Allergy & Precautions, NPO status , Patient's Chart, lab work & pertinent test results  Airway Mallampati: III  TM Distance: >3 FB Neck ROM: Full  Mouth opening: Limited Mouth Opening  Dental no notable dental hx. (+) Edentulous Upper, Edentulous Lower, Dental Advisory Given   Pulmonary asthma , COPD,  COPD inhaler, Current Smoker,    Pulmonary exam normal breath sounds clear to auscultation       Cardiovascular hypertension, +CHF  Normal cardiovascular exam Rhythm:Regular Rate:Normal  - Left ventricle: The cavity size was normal. Wall thickness was increased in a pattern of mild LVH. Systolic function was normal. The estimated ejection fraction was in the range of 60% to 65%. Wall motion was normal; there were no regional wall motion abnormalities. Left ventricular diastolic function parameters were normal. - Mitral valve: There was mild regurgitation. - Systemic veins: IVC is dilated with normal respiratory variation. Estimated right atrial pressure 8 mmHg.   Neuro/Psych PSYCHIATRIC DISORDERS Anxiety Depression Bipolar Disorder    GI/Hepatic GERD  Medicated,(+)     substance abuse  alcohol use,   Endo/Other  negative endocrine ROS  Renal/GU negative Renal ROS  negative genitourinary   Musculoskeletal negative musculoskeletal ROS (+)   Abdominal   Peds negative pediatric ROS (+)  Hematology negative hematology ROS (+)   Anesthesia Other Findings Right tibial plateau fracture  Reproductive/Obstetrics negative OB ROS                           Anesthesia Physical Anesthesia Plan  ASA: III  Anesthesia Plan: General   Post-op Pain Management:    Induction: Intravenous  PONV Risk Score and Plan: 1 and Ondansetron, Dexamethasone and Midazolam  Airway Management Planned: Oral  ETT  Additional Equipment:   Intra-op Plan:   Post-operative Plan: Extubation in OR  Informed Consent: I have reviewed the patients History and Physical, chart, labs and discussed the procedure including the risks, benefits and alternatives for the proposed anesthesia with the patient or authorized representative who has indicated his/her understanding and acceptance.     Dental advisory given  Plan Discussed with: CRNA  Anesthesia Plan Comments:         Anesthesia Quick Evaluation

## 2018-12-06 NOTE — Progress Notes (Signed)
Orthopedic Tech Progress Note Patient Details:  [name] 08/31/1976 [id number]  Patient ID: [name], male   DOB: 1976/09/27, 43 y.o.   MRN: [id number] Called in order to bio tech  Trinna Post 12/06/2018, 4:25 PM

## 2018-12-06 NOTE — Anesthesia Postprocedure Evaluation (Signed)
Anesthesia Post Note  Patient: Zyheim Cords  Procedure(s) Performed: OPEN REDUCTION INTERNAL FIXATION (ORIF) TIBIAL PLATEAU (Right Leg Lower) REMOVAL EXTERNAL FIXATION LEG (Right Leg Lower)     Patient location during evaluation: PACU Anesthesia Type: General Level of consciousness: sedated Pain management: pain level controlled Vital Signs Assessment: post-procedure vital signs reviewed and stable Respiratory status: spontaneous breathing and respiratory function stable Cardiovascular status: stable Postop Assessment: no apparent nausea or vomiting Anesthetic complications: no    Last Vitals:  Vitals:   12/06/18 1550 12/06/18 1607  BP:  (!) 164/97  Pulse: 84 77  Resp: 16 16  Temp:  36.6 C  SpO2: 95% 94%    Last Pain:  Vitals:   12/06/18 1607  TempSrc: Oral  PainSc:                  Eirene Rather DANIEL
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
## 2018-12-06 NOTE — Care Management Important Message (Signed)
Important Message  Patient Details  Name: [name] MRN: [name] Date of Birth: [birth year]   Medicare Important Message Given:  Yes    Dorena BodoIris Braelynn Benning 12/06/2018, 2:28 PM

## 2018-12-07 LAB — CBC
HCT: 35.6 % — ABNORMAL LOW (ref 39.0–52.0)
Hemoglobin: 12 g/dL — ABNORMAL LOW (ref 13.0–17.0)
MCH: 31.6 pg (ref 26.0–34.0)
MCHC: 33.7 g/dL (ref 30.0–36.0)
MCV: 93.7 fL (ref 80.0–100.0)
Platelets: 194 10*3/uL (ref 150–400)
RBC: 3.8 MIL/uL — AB (ref 4.22–5.81)
RDW: 12.9 % (ref 11.5–15.5)
WBC: 8 10*3/uL (ref 4.0–10.5)
nRBC: 0 % (ref 0.0–0.2)

## 2018-12-07 MED ORDER — KETOROLAC TROMETHAMINE 15 MG/ML IJ SOLN
15.0000 mg | Freq: Four times a day (QID) | INTRAMUSCULAR | Status: AC
  Administered 2018-12-07 – 2018-12-08 (×5): 15 mg via INTRAVENOUS
  Filled 2018-12-07 (×5): qty 1

## 2018-12-07 MED ORDER — OXYCODONE HCL 10 MG PO TABS: 10.0000 mg | ORAL_TABLET | ORAL | 0 refills | Status: DC | PRN

## 2018-12-07 MED ORDER — METHOCARBAMOL 750 MG PO TABS: 750.0000 mg | ORAL_TABLET | Freq: Four times a day (QID) | ORAL | 0 refills | Status: DC | PRN

## 2018-12-07 MED ORDER — ASPIRIN EC 325 MG PO TBEC: 325.0000 mg | DELAYED_RELEASE_TABLET | Freq: Every day | ORAL | 2 refills | Status: DC
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
## 2018-12-07 NOTE — Progress Notes (Signed)
Physical Therapy Re-Evaluation Patient Details Name: [name] MRN: [id number] DOB: February 04, 1976 Today's Date: 12/07/2018   History of Present Illness  43 year old male with a complex right tibial plateau fracture status post external fixation, NWB; Now s/p Open reduction internal fixation of right bicondylar tibial plateau fracture, Open reduction internal fixation of tibial spine fracture on 12/06/18. Continued NWB status with R LE to be in locked extension brace.  PMH including HTN, Borderline personality disorder, depression, and seizures.    Clinical Impression  Patient now s/p ORIF R tibial plateau fracture on 12/06/18. Patient continues to have NWB orders with R LE to be in locked extension brace. Patient reports he lives in a group home and does not feel that he will have adequate assistance once discharge. Patient with pain limiting all activity with patient only able to tolerate bed mobility, transfers and few "hop-to" steps with RW due to pain. VSS throughout session with BP 143/68, HR 72, and SpO2 on RA 97%. Due to current functional status and decreased caregiver support patient likely to benefit from short term rehab at discharge to progress safe and independent functional mobility.   PT goals remain appropriate.    Follow Up Recommendations SNF;Supervision/Assistance - 24 hour;Supervision for mobility/OOB    Equipment Recommendations  Rolling walker with 5" wheels;3in1 (PT)    Recommendations for Other Services       Precautions / Restrictions Precautions Precautions: Fall Restrictions Weight Bearing Restrictions: Yes RLE Weight Bearing: Non weight bearing      Mobility  Bed Mobility Overal bed mobility: Needs Assistance Bed Mobility: Supine to Sit     Supine to sit: Min assist     General bed mobility comments: Min A to bring R LE to EOB; required sitting EOB x ~5 min with PT propping R LE due to subjective reprots of lightheadedness  Transfers Overall  transfer level: Needs assistance Equipment used: Rolling walker (2 wheeled) Transfers: Sit to/from Stand Sit to Stand: From elevated surface;Min assist         General transfer comment: Min A to power up at bedside from elevated bed height; verbal cueing to maintain NWB status  Ambulation/Gait Ambulation/Gait assistance: Min assist   Assistive device: Rolling walker (2 wheeled) Gait Pattern/deviations: (hop to)     General Gait Details: few hop to steps with RW; cueing to maintain NWB; Min A for steadying  Stairs            Wheelchair Mobility    Modified Rankin (Stroke Patients Only)       Balance Overall balance assessment: Needs assistance Sitting-balance support: No upper extremity supported;Feet supported Sitting balance-Leahy Scale: Fair     Standing balance support: Bilateral upper extremity supported;During functional activity Standing balance-Leahy Scale: Poor Standing balance comment: Reliant on UE support                             Pertinent Vitals/Pain Pain Assessment: 0-10 Pain Score: 9  Pain Location: R knee Pain Descriptors / Indicators: Constant;Discomfort;Grimacing Pain Intervention(s): Limited activity within patient's tolerance;Monitored during session;Premedicated before session;Repositioned    Home Living Family/patient expects to be discharged to:: Group home Living Arrangements: Group Home     Home Access: Ramped entrance;Stairs to enter Entrance Stairs-Rails: Right Entrance Stairs-Number of Steps: 5 Home Layout: One level Home Equipment: Walker - 2 wheels;Grab bars - tub/shower      Prior Function Level of Independence: Needs assistance      ADL's /  Homemaking Assistance Needed: Performs ADLs. Group home assists with IADLs        Hand Dominance        Extremity/Trunk Assessment        Lower Extremity Assessment Lower Extremity Assessment: RLE deficits/detail RLE Deficits / Details: R tibial plateau  fracture s/p ORIF on 1/16 - expected post-op pain adn weakness RLE: Unable to fully assess due to pain;Unable to fully assess due to immobilization RLE Coordination: decreased gross motor;decreased fine motor    Cervical / Trunk Assessment Cervical / Trunk Assessment: Normal  Communication   Communication: No difficulties  Cognition Arousal/Alertness: Awake/alert Behavior During Therapy: WFL for tasks assessed/performed Overall Cognitive Status: Within Functional Limits for tasks assessed                                        General Comments General comments (skin integrity, edema, etc.): R LE wrapped in ACE bandage and in locked extension brace    Exercises     Assessment/Plan    PT Assessment Patient needs continued PT services  PT Problem List Decreased strength;Decreased range of motion;Decreased activity tolerance;Decreased balance;Decreased mobility;Decreased knowledge of use of DME;Decreased safety awareness;Decreased knowledge of precautions;Pain       PT Treatment Interventions DME instruction;Gait training;Stair training;Functional mobility training;Therapeutic activities;Therapeutic exercise;Balance training;Patient/family education;Wheelchair mobility training    PT Goals (Current goals can be found in the Care Plan section)  Acute Rehab PT Goals Patient Stated Goal: "Walk again" PT Goal Formulation: With patient Time For Goal Achievement: 12/18/18 Potential to Achieve Goals: Good    Frequency Min 3X/week   Barriers to discharge Decreased caregiver support      Co-evaluation               AM-PAC PT "6 Clicks" Mobility  Outcome Measure Help needed turning from your back to your side while in a flat bed without using bedrails?: A Little Help needed moving from lying on your back to sitting on the side of a flat bed without using bedrails?: A Little Help needed moving to and from a bed to a chair (including a wheelchair)?: A  Little Help needed standing up from a chair using your arms (e.g., wheelchair or bedside chair)?: A Little Help needed to walk in hospital room?: A Lot Help needed climbing 3-5 steps with a railing? : A Lot 6 Click Score: 16    End of Session Equipment Utilized During Treatment: Gait belt Activity Tolerance: Patient tolerated treatment well;Patient limited by pain Patient left: in chair;with call bell/phone within reach Nurse Communication: Mobility status PT Visit Diagnosis: Unsteadiness on feet (R26.81);Other abnormalities of gait and mobility (R26.89);Pain Pain - Right/Left: Right Pain - part of body: Leg    Time: [id number] PT Time Calculation (min) (ACUTE ONLY): 29 min   Charges:   PT Evaluation $PT Re-evaluation: 1 Re-eval PT Treatments $Gait Training: 8-22 mins        Kipp LaurenceStephanie R , PT, DPT Supplemental Physical Therapist 12/07/18 9:25 AM Pager: [phone] Office: [phone]

## 2018-12-07 NOTE — Plan of Care (Signed)
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
## 2018-12-07 NOTE — Progress Notes (Signed)
OT Cancellation Note  Patient Details Name: [name] MRN: [id number] DOB: 12/05/1975   Cancelled Treatment:    Reason Eval/Treat Not Completed: Other (comment). Pt with eyes closed supine in recliner, lights off. Declines therapy at this time due to currently trying to sleep. Request OT reattempt. Informed pt that OT may not be able to reattempt again this afternoon. Pt indicated understanding this and returned to sleeping. Will reattempt as able.  Raynald Kemp, OT Acute Rehabilitation Services Pager: [phone] Office: [phone]  12/07/2018, 1:42 PM

## 2018-12-07 NOTE — Progress Notes (Signed)
CSW lvm with Saintclair Halsted, patient's [employer] Social [employer] guardian to discuss SNF discharge recommendation.   CSW awaiting call back.   [place], [place] [phone]

## 2018-12-08 MED ORDER — NICOTINE 7 MG/24HR TD PT24
7.0000 mg | MEDICATED_PATCH | Freq: Every day | TRANSDERMAL | Status: DC
  Administered 2018-12-08 – 2018-12-13 (×6): 7 mg via TRANSDERMAL
  Filled 2018-12-08 (×6): qty 1

## 2018-12-08 NOTE — Progress Notes (Signed)
The patient has been calling out for pain medication frequently.  The patient will be snoring when the nurse enters the room and will ask for more medication.

## 2018-12-08 NOTE — Plan of Care (Signed)
°  Problem: Education: °Goal: Knowledge of General Education information will improve °Description: Including pain rating scale, medication(s)/side effects and non-pharmacologic comfort measures °Outcome: Progressing °  °Problem: Clinical Measurements: °Goal: Will remain free from infection °Outcome: Progressing °  °Problem: Pain Managment: °Goal: General experience of comfort will improve °Outcome: Progressing °  °Problem: Skin Integrity: °Goal: Risk for impaired skin integrity will decrease °Outcome: Progressing °  °

## 2018-12-08 NOTE — Progress Notes (Signed)
Occupational Therapy Treatment Patient Details Name: [name] MRN: [id number] DOB: Nov 18, 1976 Today's Date: 12/08/2018    History of present illness 43 year old male with a complex right tibial plateau fracture status post external fixation, NWB; Now s/p Open reduction internal fixation of right bicondylar tibial plateau fracture, Open reduction internal fixation of tibial spine fracture on 12/06/18. Continued NWB status with R LE to be in locked extension brace.  PMH including HTN, Borderline personality disorder, depression, and seizures.   OT comments  Pt progressing towards established OT goals. Pt with decreased activity tolerance due to pain. Agreeable to OOB to recliner. Initating education on compensatory techniques for LB ADLs. Pt reporting he feels he will not have adequate support at group home. Pt would benefit from post-acute rehab at dc to optimzie safety and independence with ADLs nad funcitonal mobility before returning to group home. Will continue to follow acutely as admitted and updated frequency.   Follow Up Recommendations  SNF;Supervision/Assistance - 24 hour    Equipment Recommendations  3 in 1 bedside commode    Recommendations for Other Services PT consult    Precautions / Restrictions Precautions Precautions: Fall Restrictions Weight Bearing Restrictions: Yes RLE Weight Bearing: Weight bearing as tolerated       Mobility Bed Mobility Overal bed mobility: Needs Assistance Bed Mobility: Supine to Sit     Supine to sit: Min guard     General bed mobility comments: Min Guard A for safety  Transfers Overall transfer level: Needs assistance Equipment used: Rolling walker (2 wheeled) Transfers: Sit to/from Stand Sit to Stand: From elevated surface;Min guard         General transfer comment: Min Guard A for safety. Decreased tolerance for distance in mobility due to pain    Balance Overall balance assessment: Needs assistance Sitting-balance  support: No upper extremity supported;Feet supported Sitting balance-Leahy Scale: Fair     Standing balance support: Bilateral upper extremity supported;During functional activity Standing balance-Leahy Scale: Poor Standing balance comment: Reliant on UE support                           ADL either performed or assessed with clinical judgement   ADL Overall ADL's : Needs assistance/impaired                       Lower Body Dressing Details (indicate cue type and reason): Initated education on LB dressing techniques. Pt verbalized understanding but would benefit from practice.  Toilet Transfer: Ambulation;RW;Min guard(Simulated to Investment banker, corporate Details (indicate cue type and reason): Min Guard A for safety in descent         Functional mobility during ADLs: Min guard;Rolling walker General ADL Comments: Pt reporting significant pain at right leg     Vision       Perception     Praxis      Cognition Arousal/Alertness: Awake/alert Behavior During Therapy: WFL for tasks assessed/performed Overall Cognitive Status: Within Functional Limits for tasks assessed                                          Exercises Exercises: Other exercises;General Lower Extremity General Exercises - Lower Extremity Ankle Circles/Pumps: AROM;Left;10 reps;Supine;PROM(0-60* of AROM)   Shoulder Instructions       General Comments Edema at right ankle and decreased AROM    Pertinent  Vitals/ Pain       Pain Assessment: Faces Faces Pain Scale: Hurts even more Pain Location: R knee Pain Descriptors / Indicators: Constant;Discomfort;Grimacing Pain Intervention(s): Monitored during session;Limited activity within patient's tolerance;Repositioned;Patient requesting pain meds-RN notified;Premedicated before session  Home Living                                          Prior Functioning/Environment              Frequency   Min 2X/week        Progress Toward Goals  OT Goals(current goals can now be found in the care plan section)  Progress towards OT goals: Progressing toward goals  Acute Rehab OT Goals Patient Stated Goal: "Walk again" OT Goal Formulation: With patient Time For Goal Achievement: 12/18/18 Potential to Achieve Goals: Good ADL Goals Pt Will Perform Lower Body Dressing: with set-up;with supervision;sit to/from stand;with adaptive equipment Pt Will Transfer to Toilet: with set-up;with supervision;bedside commode;ambulating Pt Will Perform Toileting - Clothing Manipulation and hygiene: with set-up;with supervision;sit to/from stand Pt Will Perform Tub/Shower Transfer: Tub transfer;ambulating;3 in 1;rolling walker;with min guard assist  Plan Discharge plan needs to be updated;Frequency needs to be updated    Co-evaluation                 AM-PAC OT "6 Clicks" Daily Activity     Outcome Measure   Help from another person eating meals?: None Help from another person taking care of personal grooming?: A Little Help from another person toileting, which includes using toliet, bedpan, or urinal?: A Little Help from another person bathing (including washing, rinsing, drying)?: A Lot Help from another person to put on and taking off regular upper body clothing?: None Help from another person to put on and taking off regular lower body clothing?: A Lot 6 Click Score: 18    End of Session Equipment Utilized During Treatment: Other (comment)(Foot plate)  OT Visit Diagnosis: Unsteadiness on feet (R26.81);Other abnormalities of gait and mobility (R26.89);Muscle weakness (generalized) (M62.81);Pain Pain - Right/Left: Right Pain - part of body: Leg   Activity Tolerance Patient tolerated treatment well   Patient Left in bed;with call bell/phone within reach   Nurse Communication Other (comment)(Skin tear at external fixator)        Time: [id number] OT Time Calculation (min): 16  min  Charges: OT General Charges $OT Visit: 1 Visit OT Treatments $Self Care/Home Management : 8-22 mins  Aerionna Moravek MSOT, OTR/L Acute Rehab Pager: [phone] Office: [phone]   Theodoro GristCharis M Ary Rudnick 12/08/2018, 4:20 PM
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
## 2018-12-08 NOTE — Progress Notes (Signed)
    Subjective: Patient reports pain as moderate to severe, although clinically he appears quite comfortable during my exam and per nursing reports.  Tolerating diet.  Urinating.  No CP, SOB.  Early mobilization with therapy- SNF recommended due to inadequate support at his group home and limited mobility.  Objective:   VITALS:   Vitals:   12/07/18 1437 12/07/18 1956 12/08/18 0521 12/08/18 0815  BP: 137/70 135/76 (!) 113/59   Pulse: 67 67 (!) 56   Resp: 16 18 18    Temp: (!) 100.4 F (38 C) 100.1 F (37.8 C) 98.9 F (37.2 C)   TempSrc: Oral Oral Oral   SpO2: 96% 100% 95% 96%  Weight:      Height:       CBC Latest Ref Rng & Units 12/07/2018 12/06/2018 12/04/2018  WBC 4.0 - 10.5 K/uL 8.0 4.7 8.7  Hemoglobin 13.0 - 17.0 g/dL 12.0(L) 12.6(L) 13.3  Hematocrit 39.0 - 52.0 % 35.6(L) 36.2(L) 38.3(L)  Platelets 150 - 400 K/uL 194 183 170   BMP Latest Ref Rng & Units 12/03/2018 12/02/2018 12/01/2018  Glucose 70 - 99 mg/dL 540(J128(H) 811(B130(H) 96  BUN 6 - 20 mg/dL 6 12 13   Creatinine 0.61 - 1.24 mg/dL 1.47(W0.60(L) 2.950.69 6.210.87  Sodium 135 - 145 mmol/L 131(L) 133(L) 137  Potassium 3.5 - 5.1 mmol/L 4.3 3.6 4.3  Chloride 98 - 111 mmol/L 96(L) 98 103  CO2 22 - 32 mmol/L 27 26 23   Calcium 8.9 - 10.3 mg/dL 3.0(Q8.6(L) 6.5(H8.4(L) 8.4(O8.8(L)   Intake/Output      01/17 0701 - 01/18 0700 01/18 0701 - 01/19 0700   P.O. 1440    I.V. (mL/kg)     IV Piggyback     Total Intake(mL/kg) 1440 (17.8)    Urine (mL/kg/hr) 2350 (1.2)    Blood     Total Output 2350    Net -910            Physical Exam: General: NAD.  Upright in bed.  Calm, conversant Resp: No increased wob Cardio: regular rate and rhythm ABD soft Neurologically intact MSK RLE: Ace wrap and dressings intact with mild to moderate dried bloody drainage.  These were removed revealing healing incisions without sign of infection.  New dressings and Ace wrap was applied. Dorsiflexion, plantarflexion, EHL, FHL intact.  Gross sensation intact distally.     Assessment: 2 Days Post-Op  S/P Procedure(s) (LRB): OPEN REDUCTION INTERNAL FIXATION (ORIF) TIBIAL PLATEAU (Right) REMOVAL EXTERNAL FIXATION LEG (Right) by Dr. Jena GaussHaddix on 12/06/2018  Principal Problem:   Right medial tibial plateau fracture, closed, initial encounter   Right bicondylar tibial plateau fracture, status post ORIF Doing well Patient reports significant pain, that is improving.  Clinically he is comfortable on exam and per nursing report.  Discussed additional medications (gabapentin, Toradol) added yesterday.  They seem to be helping. Eating, drinking, and voiding Early mobilization-SNF recommended  Plan: Up with therapy Incentive Spirometry Recommended smoking cessation.  Patient requested nicotine patch. Elevate   Weightbearing: NWB RLE Insicional and dressing care: Keep dressings dry.  Reinforce PRN Orthopedic device(s): Bledsoe knee immobilizer locked in extension and in place full-time VTE prophylaxis: Lovenox 40mg  qd, SCDs, ambulation Pain control: Tylenol Toradol, gabapentin.  Oxycodone for breakthrough pain.  Dilaudid for breakthrough pain not controlled with other medicines. Follow - up plan: 2 weeks   Dispo: SNF planning/placement in progress.  Discharge when this has been established (Sunday or Monday).    Albina BilletHenry Calvin Martensen III, PA-C 12/08/2018, 12:41 PM

## 2018-12-09 IMAGING — DX DG HAND COMPLETE 3+V*R*
3 series · 3 of 3 positions shown · non-contrast
Comparison: None.

CLINICAL DATA: The patient punched a wall 2-3 weeks ago with onset
of fourth and fifth MCP joint pain. Initial encounter.

EXAM:
RIGHT HAND - COMPLETE 3+ VIEW

[hand pa]
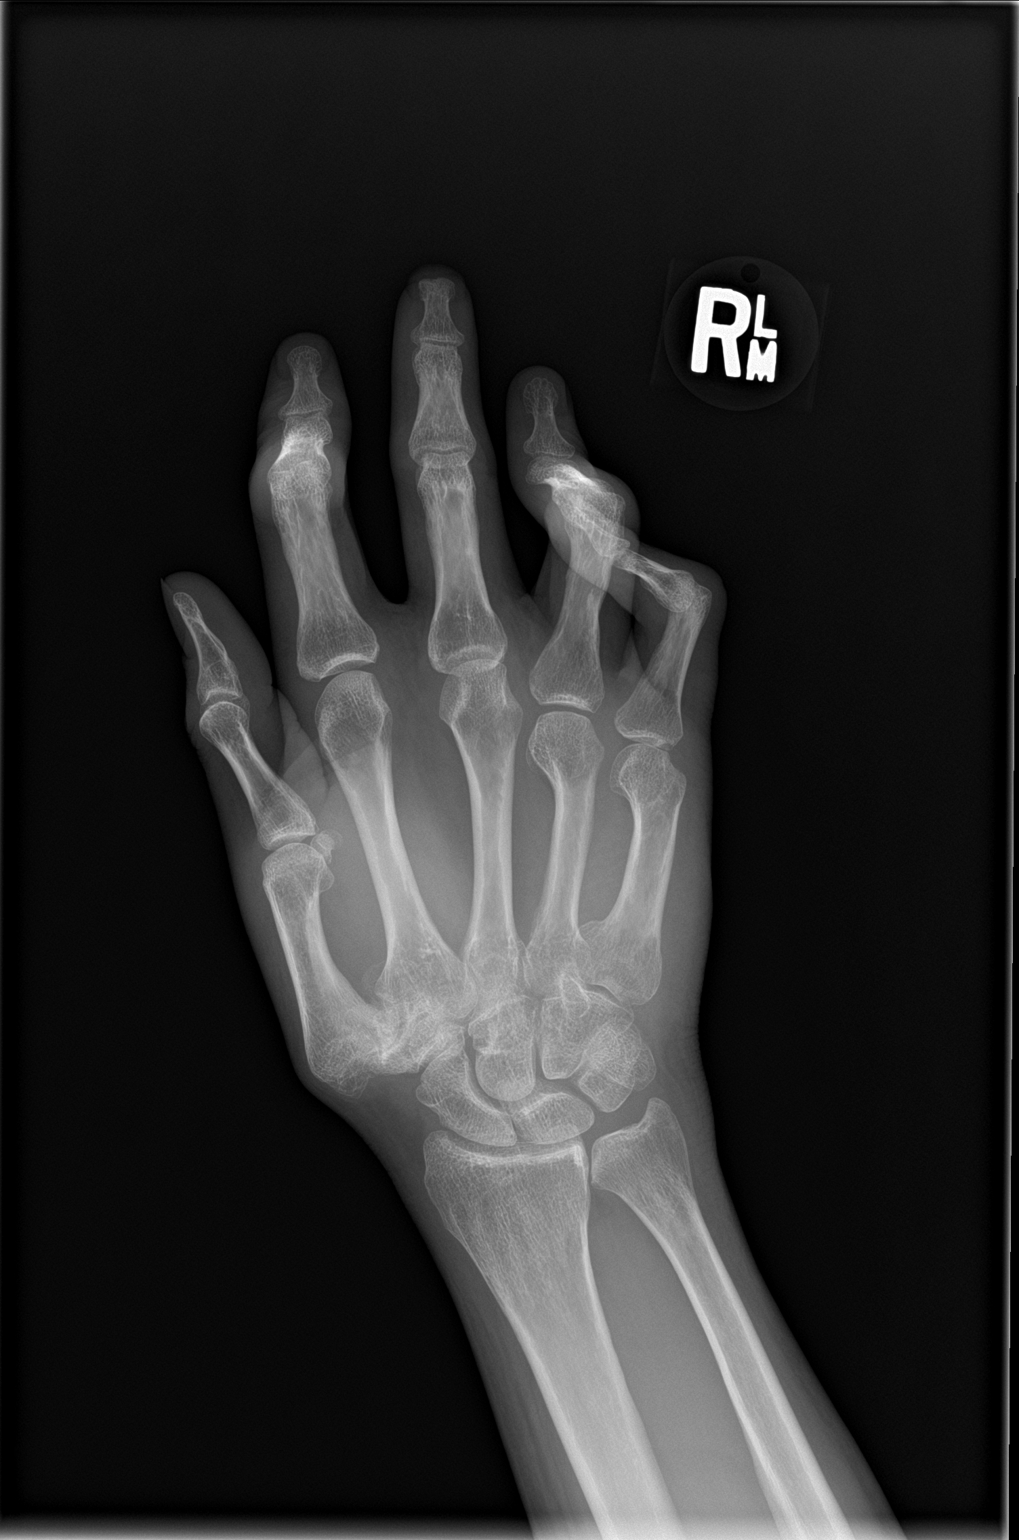

[hand obl]
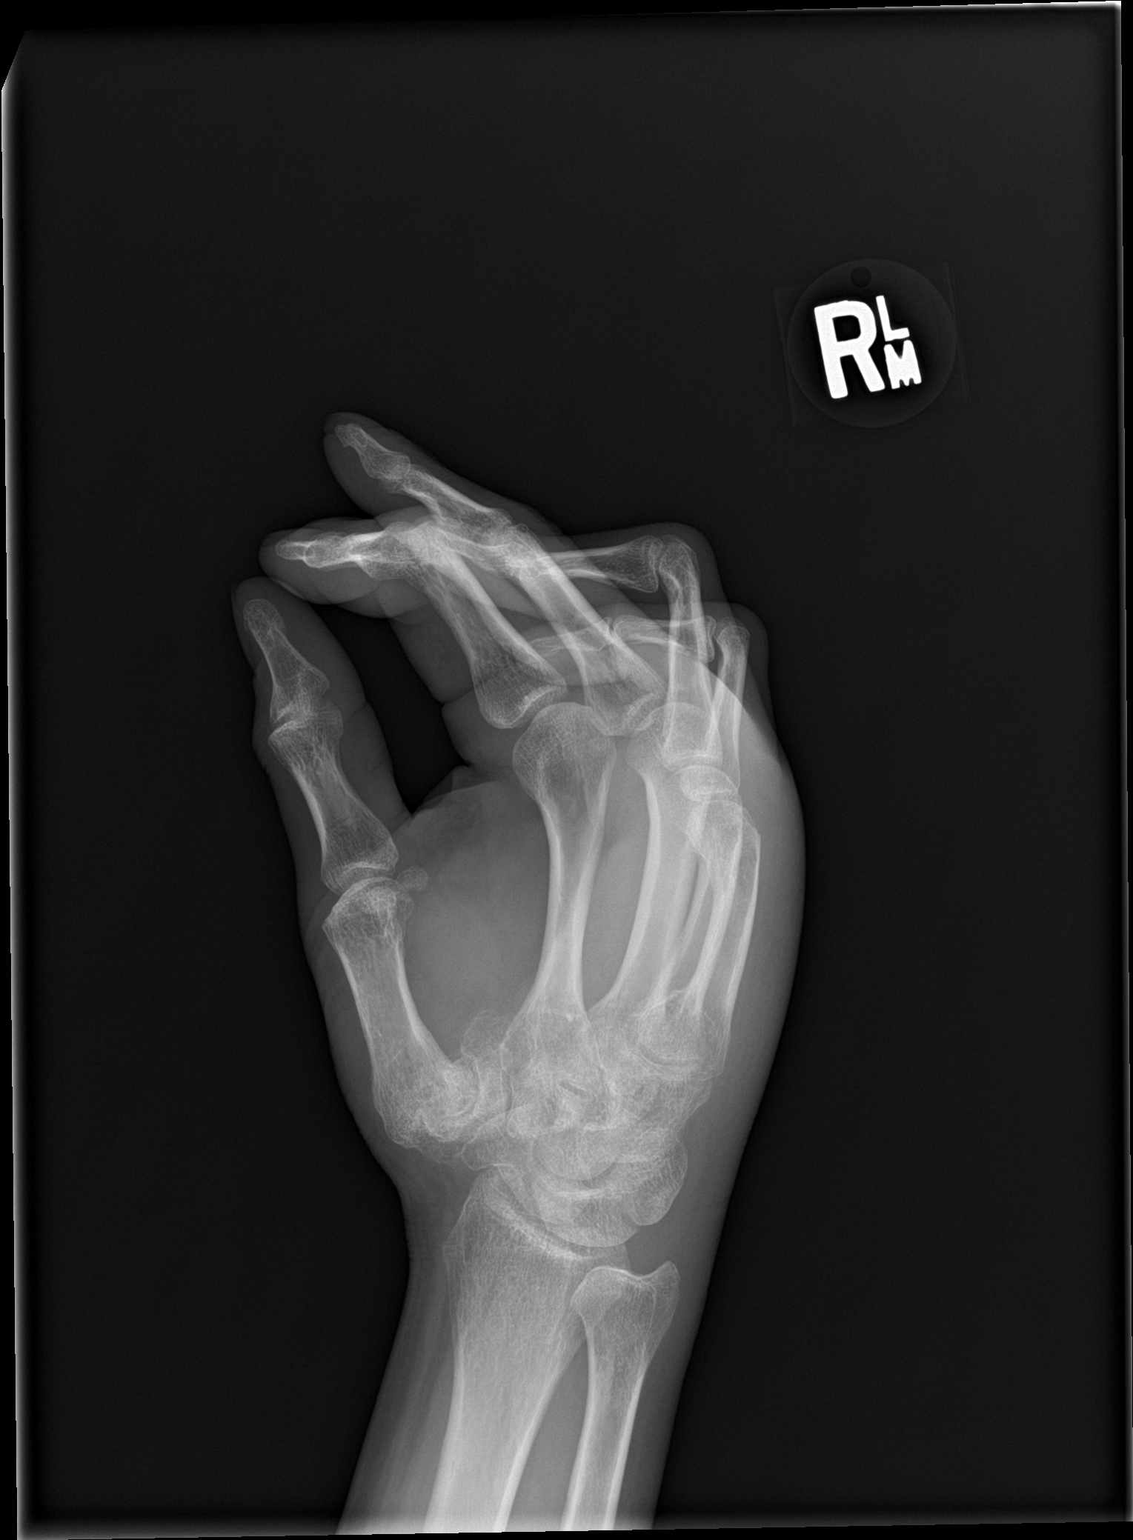

[hand lat]
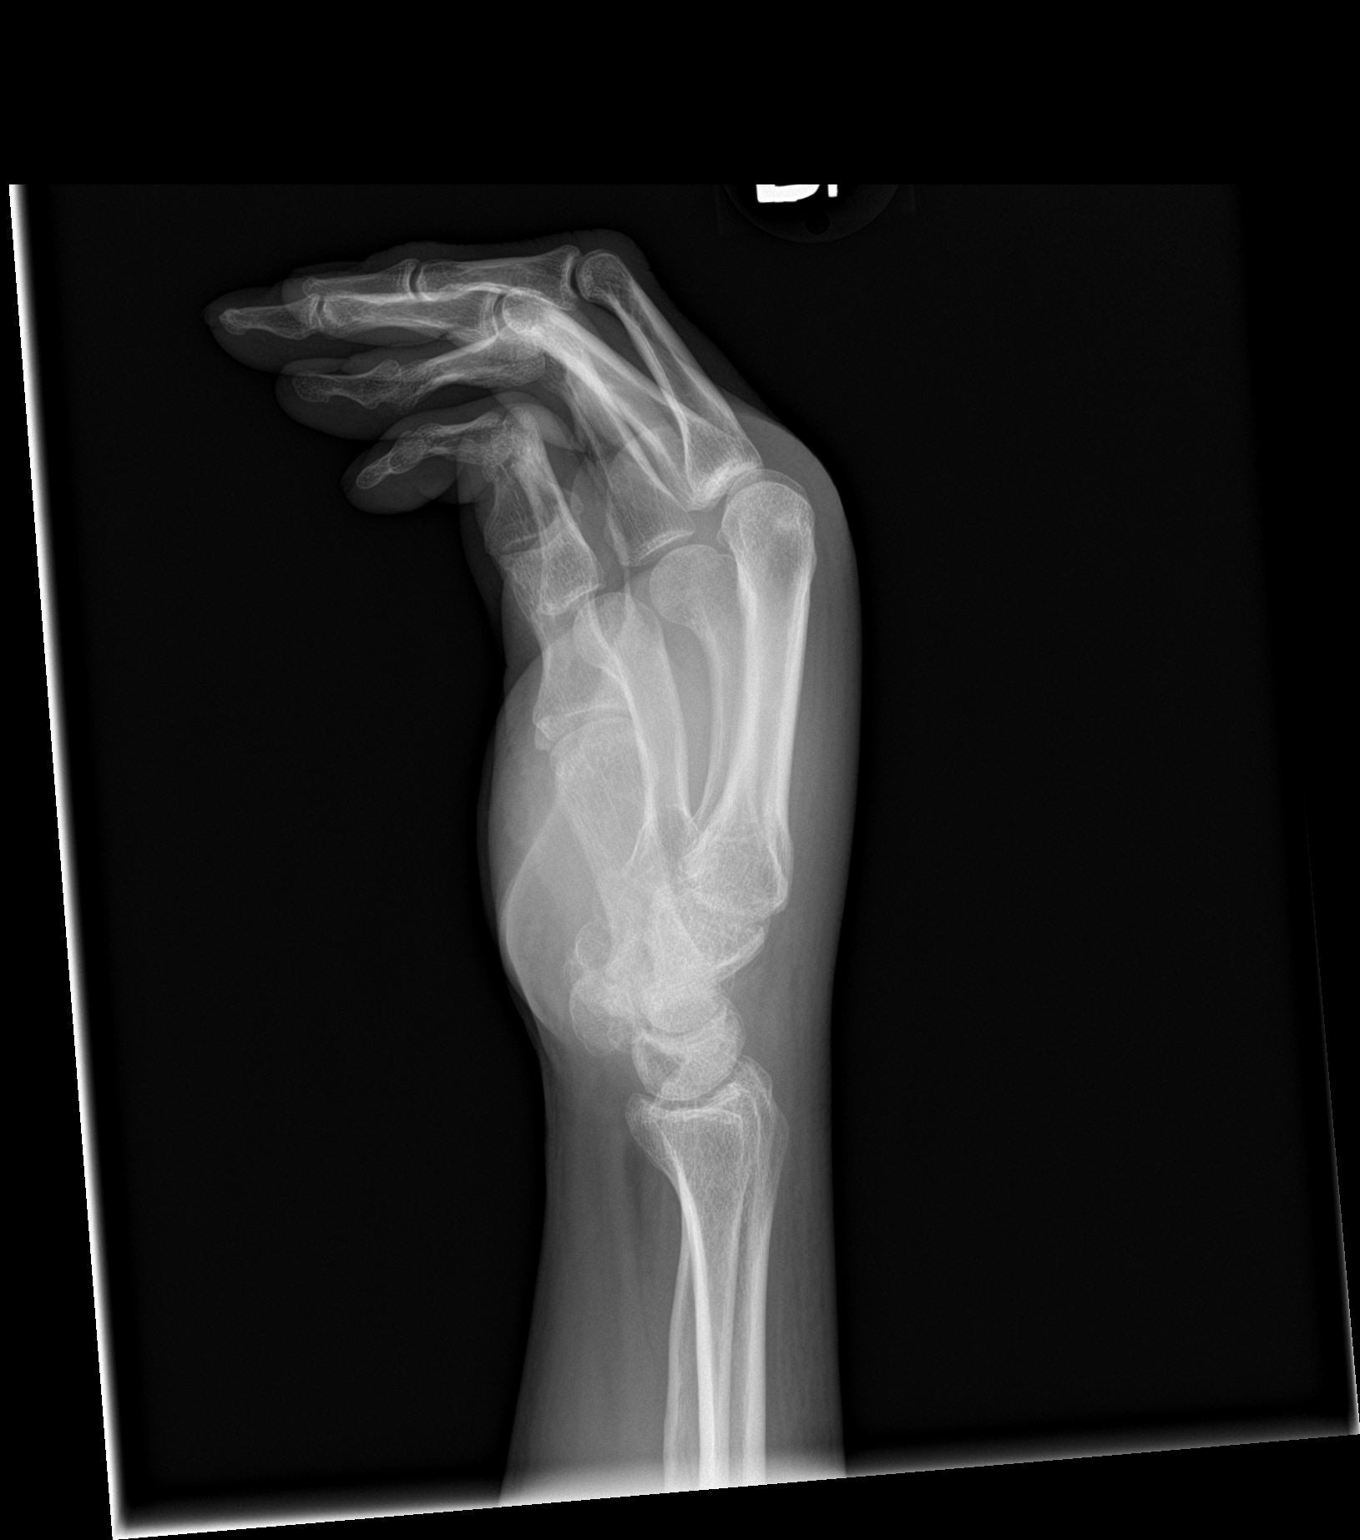

[3 of 3 positions shown; findings below may reference images not displayed]

FINDINGS: Soft tissues appear diffusely swollen. No acute fracture is
identified. There are remote healed fractures of the necks of the
fourth and fifth metacarpals. There is also likely remote healed
fracture of the proximal fifth metacarpal. All of the fingers are in
flexion. The patient has a markedly advanced for age first CMC
osteoarthritis with bone-on-bone joint space narrowing, subchondral
sclerosis and osteophytosis. Soft tissues are unremarkable.
IMPRESSION: Soft tissue swelling about the hand.  Negative for acute fracture.

Remote fourth and fifth metacarpal fractures as above.

Severe, advanced for age first CMC osteoarthritis.

## 2018-12-09 MED ORDER — OXYCODONE HCL 5 MG PO TABS
10.0000 mg | ORAL_TABLET | ORAL | Status: DC | PRN
  Administered 2018-12-09 – 2018-12-13 (×17): 15 mg via ORAL
  Filled 2018-12-09 (×17): qty 3

## 2018-12-09 NOTE — Progress Notes (Signed)
PRAFO boot and ace wrapped applied per ortho PA

## 2018-12-09 NOTE — Progress Notes (Signed)
The patient continues to request pain medications frequently.  The patient will be sleeping soundly when entering the room and will ask for the "IV pain medication"

## 2018-12-09 NOTE — Progress Notes (Signed)
Orthopedic Tech Progress Note Patient Details:  [name] 08-11-1976 [id number]  Ortho Devices Type of Ortho Device: Prafo boot/shoe Ortho Device/Splint Location: LRE Ortho Device/Splint Interventions: Application, Adjustment, Ordered   Post Interventions Patient Tolerated: Well Instructions Provided: Care of device, Adjustment of device   Donald Pore 12/09/2018, 8:26 AM

## 2018-12-09 NOTE — Progress Notes (Addendum)
    Subjective: Patient reports pain as moderate.  He has remained quite comfortable during my exam and per nursing reports.  Tolerating diet.  Urinating.  +BM on 12/06/18.  No CP, SOB.  Early mobilization with therapy- SNF recommended due to inadequate support at his group home and limited mobility.  Objective:   VITALS:   Vitals:   12/08/18 0815 12/08/18 1456 12/08/18 1922 12/09/18 0424  BP:  (!) 146/80 (!) 142/70 137/74  Pulse:  66 74 67  Resp:  18 20 20   Temp:  98.6 F (37 C) 100 F (37.8 C) 98.4 F (36.9 C)  TempSrc:  Oral Oral Oral  SpO2: 96% 100% 95% 96%  Weight:      Height:       CBC Latest Ref Rng & Units 12/07/2018 12/06/2018 12/04/2018  WBC 4.0 - 10.5 K/uL 8.0 4.7 8.7  Hemoglobin 13.0 - 17.0 g/dL 12.0(L) 12.6(L) 13.3  Hematocrit 39.0 - 52.0 % 35.6(L) 36.2(L) 38.3(L)  Platelets 150 - 400 K/uL 194 183 170   BMP Latest Ref Rng & Units 12/03/2018 12/02/2018 12/01/2018  Glucose 70 - 99 mg/dL 496(P) 591(M) 96  BUN 6 - 20 mg/dL 6 12 13   Creatinine 0.61 - 1.24 mg/dL 3.84(Y) 6.59 9.35  Sodium 135 - 145 mmol/L 131(L) 133(L) 137  Potassium 3.5 - 5.1 mmol/L 4.3 3.6 4.3  Chloride 98 - 111 mmol/L 96(L) 98 103  CO2 22 - 32 mmol/L 27 26 23   Calcium 8.9 - 10.3 mg/dL 7.0(V) 7.7(L) 3.9(Q)   Intake/Output      01/18 0701 - 01/19 0700 01/19 0701 - 01/20 0700   P.O. 720    Total Intake(mL/kg) 720 (8.9)    Urine (mL/kg/hr) 2000 (1)    Total Output 2000    Net -1280            Physical Exam: General: NAD.  Supine asleep in bed on arrival.  Calm, conversant. Resp: No increased wob Cardio: regular rate and rhythm ABD soft Neurologically intact MSK RLE: Ace wrap and dressings c/d/i.  Foot warm w/ Moderate swelling.  Foot resting in plantarflexion. Small ROM Dorsiflexion, plantarflexion, EHL, FHL intact.  Gross sensation intact distally.  Calf and thigh soft / compressible.  No pain w/ passive motion ankle/toes.  Assessment: 3 Days Post-Op  S/P Procedure(s) (LRB): OPEN  REDUCTION INTERNAL FIXATION (ORIF) TIBIAL PLATEAU (Right) REMOVAL EXTERNAL FIXATION LEG (Right) by Dr. Jena Gauss on 12/06/2018  Principal Problem:   Right medial tibial plateau fracture, closed, initial encounter   Right bicondylar tibial plateau fracture, status post ORIF Doing well Patient reports significant pain, that is improving.  Clinically he is comfortable on exam and per nursing report.  Encouraged weaning of narcotic medicine and discussed w/ nursing team. Eating, drinking, and voiding Early mobilization-SNF recommended  Plan: Up with therapy Incentive Spirometry Recommended smoking cessation.  Patient requested nicotine patch. Elevate  Will add PRAFO boot.  Weightbearing: NWB RLE Insicional and dressing care: Keep dressings dry.  Reinforce PRN Orthopedic device(s): Bledsoe knee immobilizer locked in extension and in place full-time.  PRAFO boot. VTE prophylaxis: Lovenox 40mg  qd, SCDs, ambulation Pain control: Tylenol Toradol, gabapentin.  Oxycodone for breakthrough pain.  Dilaudid for breakthrough pain not controlled with other medicines. Follow - up plan: 2 weeks   Dispo: SNF planning/placement in progress.  Discharge when this has been established (likely Monday).    Shane Kern Martensen III, PA-C 12/09/2018, 7:52 AM

## 2018-12-10 NOTE — Discharge Instructions (Signed)
Orthopaedic Trauma Service Discharge Instructions   General Discharge Instructions  WEIGHT BEARING STATUS: Non-weightbearing right lower extremity  RANGE OF MOTION/ACTIVITY:  Keep hinge knee brace locked in full extension at all times  Wound Care: Change ACE wrap as needed. Can leave incision open to air if no drainage. Okay to shower  DVT/PE prophylaxis: Will be discharged on Aspirin 325 mg  Diet: as you were eating previously.  Can use over the counter stool softeners and bowel preparations, such as Miralax, to help with bowel movements.  Narcotics can be constipating.  Be sure to drink plenty of fluids  PAIN MEDICATION USE AND EXPECTATIONS  You have likely been given narcotic medications to help control your pain.  After a traumatic event that results in an fracture (broken bone) with or without surgery, it is ok to use narcotic pain medications to help control one's pain.  We understand that everyone responds to pain differently and each individual patient will be evaluated on a regular basis for the continued need for narcotic medications. Ideally, narcotic medication use should last no more than 6-8 weeks (coinciding with fracture healing).   As a patient it is your responsibility as well to monitor narcotic medication use and report the amount and frequency you use these medications when you come to your office visit.   We would also advise that if you are using narcotic medications, you should take a dose prior to therapy to maximize you participation.  IF YOU ARE ON NARCOTIC MEDICATIONS IT IS NOT PERMISSIBLE TO OPERATE A MOTOR VEHICLE (MOTORCYCLE/CAR/TRUCK/MOPED) OR HEAVY MACHINERY DO NOT MIX NARCOTICS WITH OTHER CNS (CENTRAL NERVOUS SYSTEM) DEPRESSANTS SUCH AS ALCOHOL   STOP SMOKING OR USING NICOTINE PRODUCTS!!!!  As discussed nicotine severely impairs your body's ability to heal surgical and traumatic wounds but also impairs bone healing.  Wounds and bone heal by forming  microscopic blood vessels (angiogenesis) and nicotine is a vasoconstrictor (essentially, shrinks blood vessels).  Therefore, if vasoconstriction occurs to these microscopic blood vessels they essentially disappear and are unable to deliver necessary nutrients to the healing tissue.  This is one modifiable factor that you can do to dramatically increase your chances of healing your injury.    (This means no smoking, no nicotine gum, patches, etc)  DO NOT USE NONSTEROIDAL ANTI-INFLAMMATORY DRUGS (NSAID'S)  Using products such as Advil (ibuprofen), Aleve (naproxen), Motrin (ibuprofen) for additional pain control during fracture healing can delay and/or prevent the healing response.  If you would like to take over the counter (OTC) medication, Tylenol (acetaminophen) is ok.  However, some narcotic medications that are given for pain control contain acetaminophen as well. Therefore, you should not exceed more than 4000 mg of tylenol in a day if you do not have liver disease.  Also note that there are may OTC medicines, such as cold medicines and allergy medicines that my contain tylenol as well.  If you have any questions about medications and/or interactions please ask your doctor/PA or your pharmacist.      ICE AND ELEVATE INJURED/OPERATIVE EXTREMITY  Using ice and elevating the injured extremity above your heart can help with swelling and pain control.  Icing in a pulsatile fashion, such as 20 minutes on and 20 minutes off, can be followed.    Do not place ice directly on skin. Make sure there is a barrier between to skin and the ice pack.    Using frozen items such as frozen peas works well as the conform nicely to  the are that needs to be iced.  USE AN ACE WRAP OR TED HOSE FOR SWELLING CONTROL  In addition to icing and elevation, Ace wraps or TED hose are used to help limit and resolve swelling.  It is recommended to use Ace wraps or TED hose until you are informed to stop.    When using Ace Wraps  start the wrapping distally (farthest away from the body) and wrap proximally (closer to the body)   Example: If you had surgery on your leg or thing and you do not have a splint on, start the ace wrap at the toes and work your way up to the thigh        If you had surgery on your upper extremity and do not have a splint on, start the ace wrap at your fingers and work your way up to the upper arm    CALL THE OFFICE WITH ANY QUESTIONS OR CONCERNS: [phone]     Discharge Wound Care Instructions  Do NOT apply any ointments, solutions or lotions to pin sites or surgical wounds.  These prevent needed drainage and even though solutions like hydrogen peroxide kill bacteria, they also damage cells lining the pin sites that help fight infection.  Applying lotions or ointments can keep the wounds moist and can cause them to breakdown and open up as well. This can increase the risk for infection. When in doubt call the office.  Surgical incisions should be dressed daily.  If any drainage is noted, use one layer of adaptic, then gauze, Kerlix, and an ace wrap.  Once the incision is completely dry and without drainage, it may be left open to air out.  Showering may begin 36-48 hours later.  Cleaning gently with soap and water.  Traumatic wounds should be dressed daily as well.    One layer of adaptic, gauze, Kerlix, then ace wrap.  The adaptic can be discontinued once the draining has ceased    If you have a wet to dry dressing: wet the gauze with saline the squeeze as much saline out so the gauze is moist (not soaking wet), place moistened gauze over wound, then place a dry gauze over the moist one, followed by Kerlix wrap, then ace wrap.
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
## 2018-12-10 NOTE — Plan of Care (Signed)
Problem: Education: Goal: Knowledge of General Education information will improve Description Including pain rating scale, medication(s)/side effects and non-pharmacologic comfort measures Outcome: Progressing   Problem: Clinical Measurements: Goal: Ability to maintain clinical measurements within normal limits will improve Outcome: Progressing Goal: Respiratory complications will improve Outcome: Progressing   Problem: Activity: Goal: Risk for activity intolerance will decrease Outcome: Progressing   Problem: Nutrition: Goal: Adequate nutrition will be maintained Outcome: Progressing   Problem: Elimination: Goal: Will not experience complications related to urinary retention Outcome: Progressing   Problem: Safety: Goal: Ability to remain free from injury will improve Outcome: Progressing   Problem: Skin Integrity: Goal: Risk for impaired skin integrity will decrease Outcome: Progressing

## 2018-12-10 NOTE — Progress Notes (Signed)
CSW lvm with DSS guardian of patient once again to discuss discharge plan and SNF rec.   [place], [place] [phone]
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
## 2018-12-11 ENCOUNTER — Encounter (HOSPITAL_COMMUNITY): Payer: Self-pay | Admitting: Student

## 2018-12-11 MED ORDER — HYDROMORPHONE HCL 1 MG/ML IJ SOLN
1.0000 mg | Freq: Four times a day (QID) | INTRAMUSCULAR | Status: DC | PRN
  Administered 2018-12-11 – 2018-12-13 (×8): 1 mg via INTRAVENOUS
  Filled 2018-12-11 (×8): qty 1
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
## 2018-12-11 NOTE — Care Management Important Message (Signed)
Important Message  Patient Details  Name: [name] MRN: [id number] Date of Birth: 02-Jul-1976   Medicare Important Message Given:  Yes    Dorena Bodo 12/11/2018, 3:25 PM

## 2018-12-11 NOTE — Social Work (Signed)
CSW spoke to patient's DSS guardian, Saintclair Halsted. Guardian is agreeable to SNF placement. Referrals for SNF were sent out, only accepting facility is [facility]. Gave this offer to guardian and she is agreeable to [place].   Confirmed bed offer at [facility] and they will start patient's [facility] authorization today. Will need auth prior to admitting to the facility.   CSW to follow.

## 2018-12-12 MED ORDER — ASPIRIN EC 81 MG PO TBEC: 81.0000 mg | DELAYED_RELEASE_TABLET | Freq: Every day | ORAL | 0 refills | Status: AC

## 2018-12-12 NOTE — Progress Notes (Signed)
PT Cancellation Note  Patient Details Name: [name] MRN: [id number] DOB: 04/09/1976   Cancelled Treatment:    Reason Eval/Treat Not Completed: Patient declined, no reason specified Patient declined participating in therapy despite encouragement at this time and c/o 7/10 pain. Pt lying supine and eyes closed and reports "they cut back on my pain medicine". Pt encouraged to get up with nursing staff assistance. PT will continue to follow acutely.    Derek Mound, PTA Acute Rehabilitation Services Pager: [phone] Office: [phone]   12/12/2018, 2:05 PM

## 2018-12-12 NOTE — Clinical Social Work Note (Signed)
Clinical Social Work Assessment  Patient Details  Name: [name] MRN: [id number] Date of Birth: 1976-01-07  Date of referral:  12/12/18               Reason for consult:  Discharge Planning                Permission sought to share information with:  Case Manager, Facility Medical sales representative, Family Supports Permission granted to share information::  Yes, Verbal Permission Granted  Name::     [name]  Agency::  SNFs  Relationship::  DD guardian  Contact Information:  [phone]  Housing/Transportation Living arrangements for the past 2 months:  Group Home Source of Information:  Guardian Patient Interpreter Needed:  None Criminal Activity/Legal Involvement Pertinent to Current Situation/Hospitalization:  No - Comment as needed Significant Relationships:  None Lives with:  Facility Resident Do you feel safe going back to the place where you live?  No Need for family participation in patient care:  Yes (Comment)  Care giving concerns:  CSW received referral for possible SNF placement at time of discharge. Spoke with patient's DSS guardian [name] regarding possibility of SNF placement . Patient's group home  is currently unable to care for him given patient's current needs and fall risk.  Patient's legal guardian [name]   expressed understanding of PT recommendation and are agreeable to SNF placement at time of discharge. CSW to continue to follow and assist with discharge planning needs.     Social Worker assessment / plan:  Spoke with patient's legal guardian [name]   concerning possibility of rehab at SNF before returning home.    Employment status:  Retired Database administrator PT Recommendations:  Skilled Nursing Facility Information / Referral to community resources:  Skilled Nursing Facility  Patient/Family's Response to care:  Patient's guardian [name] expressed    recognizing need for rehab before returning home and are agreeable to a SNF in  [place]. They report preference for  [facility]. CSW explained insurance authorization process. Patient's family reported that they want patient to get stronger to be able to come back home.    Patient/Family's Understanding of and Emotional Response to Diagnosis, Current Treatment, and Prognosis:  Patient/family is realistic regarding therapy needs and expressed being hopeful for SNF placement. Patient's guardian expressed understanding of CSW role and discharge process as well as medical condition. No questions/concerns about plan or treatment.    Emotional Assessment Appearance:  Appears stated age Attitude/Demeanor/Rapport:  Unable to Assess Affect (typically observed):  Unable to Assess Orientation:  Oriented to Self, Oriented to Place, Oriented to  Time, Oriented to Situation Alcohol / Substance use:  Not Applicable Psych involvement (Current and /or in the community):  No (Comment)  Discharge Needs  Concerns to be addressed:  Discharge Planning Concerns Readmission within the last 30 days:  No Current discharge risk:  Dependent with Mobility Barriers to Discharge:  Continued Medical Work up   [employer], LCSW 12/12/2018, 10:50 AM

## 2018-12-12 NOTE — Plan of Care (Signed)
Problem: Education: Goal: Knowledge of General Education information will improve Description Including pain rating scale, medication(s)/side effects and non-pharmacologic comfort measures Outcome: Progressing   Problem: Health Behavior/Discharge Planning: Goal: Ability to manage health-related needs will improve Outcome: Progressing   Problem: Clinical Measurements: Goal: Respiratory complications will improve Outcome: Progressing   Problem: Nutrition: Goal: Adequate nutrition will be maintained Outcome: Progressing   Problem: Elimination: Goal: Will not experience complications related to bowel motility Outcome: Progressing Goal: Will not experience complications related to urinary retention Outcome: Progressing   Problem: Safety: Goal: Ability to remain free from injury will improve Outcome: Progressing   Problem: Skin Integrity: Goal: Risk for impaired skin integrity will decrease Outcome: Progressing

## 2018-12-12 NOTE — [place] FL2 (Addendum)
[place] MEDICAID FL2 LEVEL OF CARE SCREENING TOOL     IDENTIFICATION  Patient Name: [name] Birthdate: Oct 23, 1976 Sex: male Admission Date (Current Location): 12/03/2018  [facility] and [place] Number:  Producer, television/film/video and Address:  [facility] [place]. [facility], [address], [place], [place] [address]      Provider Number: [id number]  Attending Physician Name and Address:  Roby Lofts, MD  Relative Name and Phone Number:  [name] DSS guardian [phone]    Current Level of Care: Hospital Recommended Level of Care: Skilled Nursing Facility Prior Approval Number:    Date Approved/Denied: 12/25/15 PASRR Number: [id number]  Discharge Plan: SNF    Current Diagnoses: Patient Active Problem List   Diagnosis Date Noted  . Right medial tibial plateau fracture, closed, initial encounter 12/03/2018  . Cellulitis of left lower extremity   . Cellulitis of left foot 03/07/2018  . COPD (chronic obstructive pulmonary disease) (HCC) 05/03/2017  . Hyponatremia 06/27/2016  . GERD (gastroesophageal reflux disease) 11/11/2015  . Borderline personality disorder (HCC) 11/11/2015  . Alcohol use disorder, severe, in controlled environment (HCC) 11/11/2015  . 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 Tobacco abuse 11/05/2013    Orientation RESPIRATION BLADDER Height & Weight     Self, Time, Situation, Place  Normal Continent Weight: 80.7 kg Height:  5\' 11"  (180.3 cm)  BEHAVIORAL SYMPTOMS/MOOD NEUROLOGICAL BOWEL NUTRITION STATUS      Continent Diet(see discharge summary)  AMBULATORY STATUS COMMUNICATION OF NEEDS Skin   Limited Assist Verbally Surgical wounds(right leg closed surgical incision)                       Personal Care Assistance Level of Assistance  Bathing, Feeding, Dressing, Total care Bathing Assistance: Limited assistance Feeding  assistance: Independent Dressing Assistance: Limited assistance Total Care Assistance: Limited assistance   Functional Limitations Info  Sight, Hearing, Speech Sight Info: Adequate Hearing Info: Adequate Speech Info: Adequate    SPECIAL CARE FACTORS FREQUENCY  PT (By licensed PT), OT (By licensed OT)     PT Frequency: min 5x weekly OT Frequency: min 5x weekly            Contractures Contractures Info: Not present    Additional Factors Info  Code Status, Allergies Code Status Info: full Allergies Info: Morphine and related, Penicillins           Current Medications (12/12/2018):  This is the current hospital active medication list Current Facility-Administered Medications  Medication Dose Route Frequency Provider Last Rate Last Dose  . 0.9 %  sodium chloride infusion  250 mL Intravenous PRN Ulyses Southward A, PA-C      . albuterol (PROVENTIL) (2.5 MG/3ML) 0.083% nebulizer solution 3 mL  3 mL Inhalation Q6H PRN Despina Hidden, PA-C      . cholecalciferol (VITAMIN D3) tablet 2,000 Units  2,000 Units Oral Daily Despina Hidden, PA-C   2,000 Units at 12/12/18 6333  . divalproex (DEPAKOTE ER) 24 hr tablet 2,000 mg  2,000 mg Oral QHS Ulyses Southward A, PA-C   2,000 mg at 12/11/18 2133  . docusate sodium (COLACE) capsule 100 mg  100 mg Oral BID Ulyses Southward A, PA-C   100 mg at 12/12/18 0915  . enoxaparin (LOVENOX) injection 40 mg  40 mg Subcutaneous Q24H Ulyses Southward A, PA-C   40 mg at 12/12/18 5456  . escitalopram (LEXAPRO) tablet 20  mg  20 mg Oral Daily Ulyses Southward A, PA-C   20 mg at 12/12/18 9038  . feeding supplement (ENSURE ENLIVE) (ENSURE ENLIVE) liquid 237 mL  237 mL Oral BID BM Ulyses Southward A, PA-C   237 mL at 12/12/18 0914  . ferrous sulfate tablet 325 mg  325 mg Oral BID WC Despina Hidden, PA-C   325 mg at 12/12/18 3338  . furosemide (LASIX) tablet 20 mg  20 mg Oral Daily Ulyses Southward A, PA-C   20 mg at 12/12/18 3291  . gabapentin (NEURONTIN) capsule 300 mg  300 mg  Oral TID Ulyses Southward A, PA-C   300 mg at 12/12/18 0915  . HYDROmorphone (DILAUDID) injection 1 mg  1 mg Intravenous Q6H PRN Haddix, Gillie Manners, MD   1 mg at 12/12/18 1028  . lactated ringers infusion   Intravenous Continuous Despina Hidden, PA-C 10 mL/hr at 12/06/18 1029    . lactulose (CHRONULAC) 10 GM/15ML solution 30 g  30 g Oral Daily Ulyses Southward A, PA-C   30 g at 12/08/18 0951  . Melatonin TABS 9 mg  9 mg Oral QHS Ulyses Southward A, PA-C   9 mg at 12/11/18 2137  . methocarbamol (ROBAXIN) tablet 750 mg  750 mg Oral Q6H PRN Despina Hidden, PA-C   750 mg at 12/12/18 0602  . nicotine (NICODERM CQ - dosed in mg/24 hr) patch 7 mg  7 mg Transdermal Daily Albina Billet III, PA-C   7 mg at 12/12/18 0913  . ondansetron (ZOFRAN) 8 mg in sodium chloride 0.9 % 50 mL IVPB  8 mg Intravenous Q8H PRN Ulyses Southward A, PA-C      . oxyCODONE (Oxy IR/ROXICODONE) immediate release tablet 10-15 mg  10-15 mg Oral Q4H PRN Albina Billet III, PA-C   15 mg at 12/12/18 0602  . pantoprazole (PROTONIX) EC tablet 40 mg  40 mg Oral Daily Ulyses Southward A, PA-C   40 mg at 12/12/18 9166  . polyethylene glycol (MIRALAX / GLYCOLAX) packet 17 g  17 g Oral Daily Ulyses Southward A, PA-C   17 g at 12/12/18 0915  . primidone (MYSOLINE) tablet 250 mg  250 mg Oral TID Ulyses Southward A, PA-C   250 mg at 12/12/18 0915  . primidone (MYSOLINE) tablet 50 mg  50 mg Oral TID Ulyses Southward A, PA-C   50 mg at 12/12/18 0600  . QUEtiapine (SEROQUEL) tablet 200 mg  200 mg Oral QHS Ulyses Southward A, PA-C   200 mg at 12/11/18 2134  . sodium chloride flush (NS) 0.9 % injection 3 mL  3 mL Intravenous Q12H Ulyses Southward A, PA-C   3 mL at 12/12/18 4599  . sodium chloride flush (NS) 0.9 % injection 3 mL  3 mL Intravenous PRN Despina Hidden, PA-C      . umeclidinium-vilanterol (ANORO ELLIPTA) 62.5-25 MCG/INH 1 puff  1 puff Inhalation Daily Despina Hidden, PA-C   1 puff at 12/12/18 [id number]  . Valbenazine Tosylate CAPS 40 mg  40 mg Oral QHS  Ulyses Southward A, PA-C   40 mg at 12/11/18 2155  . Valbenazine Tosylate CAPS 80 mg  80 mg Oral Daily Despina Hidden, PA-C   80 mg at 12/12/18 1028     Discharge Medications: Please see discharge summary for a list of discharge medications.  Relevant Imaging Results:  Relevant Lab Results:   Additional Information SSN: [id number]  Gildardo Griffes, LCSW

## 2018-12-13 ENCOUNTER — Encounter: Payer: Self-pay | Admitting: Student

## 2018-12-13 NOTE — Progress Notes (Signed)
Physical Therapy Treatment Patient Details Name: [name] MRN: [id number] DOB: [birth year] Today's Date: 12/13/2018    History of Present Illness 43 year old male with a complex right tibial plateau fracture status post external fixation, NWB; Now s/p Open reduction internal fixation of right bicondylar tibial plateau fracture, Open reduction internal fixation of tibial spine fracture on 12/06/18. Continued NWB status with R LE to be in locked extension brace.  PMH including HTN, Borderline personality disorder, depression, and seizures.    PT Comments    Patient seen for mobility progression. Pt reports that pain has decreased since yesterday and agreeable to participate in therapy. Pt tolerates LE therex and gait training well. Weight bearing and ROM precautions reviewed with pt which he is able to maintain well during session. Encouraged elevation of R LE. Continue to progress as tolerated with anticipated d/c to SNF for further skilled PT services.     Follow Up Recommendations  SNF;Supervision/Assistance - 24 hour;Supervision for mobility/OOB     Equipment Recommendations  Rolling walker with 5" wheels;3in1 (PT)    Recommendations for Other Services       Precautions / Restrictions Precautions Precautions: Fall Restrictions Weight Bearing Restrictions: Yes RLE Weight Bearing: Non weight bearing    Mobility  Bed Mobility Overal bed mobility: Modified Independent                Transfers Overall transfer level: Needs assistance Equipment used: Rolling walker (2 wheeled) Transfers: Sit to/from Stand Sit to Stand: Min guard            Ambulation/Gait Ambulation/Gait assistance: Min assist;Min guard Gait Distance (Feet): 40 Feet Assistive device: Rolling walker (2 wheeled) Gait Pattern/deviations: Step-to pattern     General Gait Details: cues for proximity to RW and moving too quickly; assist to steady   Stairs             Wheelchair Mobility    Modified Rankin (Stroke Patients Only)       Balance Overall balance assessment: Needs assistance Sitting-balance support: No upper extremity supported;Feet supported Sitting balance-Leahy Scale: Good     Standing balance support: Bilateral upper extremity supported;During functional activity Standing balance-Leahy Scale: Poor Standing balance comment: Reliant on UE support                            Cognition Arousal/Alertness: Awake/alert Behavior During Therapy: WFL for tasks assessed/performed Overall Cognitive Status: Within Functional Limits for tasks assessed                                        Exercises General Exercises - Lower Extremity Ankle Circles/Pumps: AROM;Right;20 reps Quad Sets: AROM;Both;Supine;15 reps Hip ABduction/ADduction: AROM;Right;Supine;15 reps Straight Leg Raises: Right;Supine;AROM;15 reps    General Comments General comments (skin integrity, edema, etc.): pt declined ambulating in hallwy because "it's hot out there" and reports when he gets too hot he begins to feel dizzy      Pertinent Vitals/Pain Pain Assessment: Faces Faces Pain Scale: Hurts little more Pain Location: R knee Pain Descriptors / Indicators: Guarding;Sore Pain Intervention(s): Limited activity within patient's tolerance;Monitored during session;Premedicated before session;Repositioned    Home Living                      Prior Function            PT Goals (current goals  can now be found in the care plan section) Acute Rehab PT Goals Patient Stated Goal: "Walk again" Progress towards PT goals: Progressing toward goals    Frequency    Min 3X/week      PT Plan Current plan remains appropriate    Co-evaluation              AM-PAC PT "6 Clicks" Mobility   Outcome Measure  Help needed turning from your back to your side while in a flat bed without using bedrails?: None Help needed moving from lying on your back to  sitting on the side of a flat bed without using bedrails?: None Help needed moving to and from a bed to a chair (including a wheelchair)?: A Little Help needed standing up from a chair using your arms (e.g., wheelchair or bedside chair)?: A Little Help needed to walk in hospital room?: A Little Help needed climbing 3-5 steps with a railing? : A Little 6 Click Score: 20    End of Session Equipment Utilized During Treatment: Gait belt Activity Tolerance: Patient tolerated treatment well Patient left: with call bell/phone within reach;in chair Nurse Communication: Mobility status;Other (comment)(NT notified pt would like bed linens changed) PT Visit Diagnosis: Unsteadiness on feet (R26.81);Other abnormalities of gait and mobility (R26.89);Pain Pain - Right/Left: Right Pain - part of body: Leg     Time: 1030-1045 PT Time Calculation (min) (ACUTE ONLY): 15 min  Charges:  $Gait Training: 8-22 mins                     Erline Levine, PTA Acute Rehabilitation Services Pager: [phone] Office: [phone]     Carolynne Edouard 12/13/2018, 10:52 AM

## 2018-12-13 NOTE — Plan of Care (Signed)
Problem: Education: Goal: Knowledge of General Education information will improve Description Including pain rating scale, medication(s)/side effects and non-pharmacologic comfort measures Outcome: Progressing   Problem: Health Behavior/Discharge Planning: Goal: Ability to manage health-related needs will improve Outcome: Progressing   Problem: Clinical Measurements: Goal: Respiratory complications will improve Outcome: Progressing   Problem: Activity: Goal: Risk for activity intolerance will decrease Outcome: Progressing   Problem: Nutrition: Goal: Adequate nutrition will be maintained Outcome: Progressing   Problem: Elimination: Goal: Will not experience complications related to urinary retention Outcome: Progressing   Problem: Safety: Goal: Ability to remain free from injury will improve Outcome: Progressing   Problem: Skin Integrity: Goal: Risk for impaired skin integrity will decrease Outcome: Progressing

## 2018-12-13 NOTE — Progress Notes (Signed)
Occupational Therapy Treatment Patient Details Name: [name] Lantier MRN: [id number] DOB: March 15, 1976 Today's Date: 12/13/2018    History of present illness 43 year old male with a complex right tibial plateau fracture status post external fixation, NWB; Now s/p Open reduction internal fixation of right bicondylar tibial plateau fracture, Open reduction internal fixation of tibial spine fracture on 12/06/18. Continued NWB status with R LE to be in locked extension brace.  PMH including HTN, Borderline personality disorder, depression, and seizures.   OT comments  Pt progressing towards OT goals. Pt demonstrates increased tolerance and motivation to engage in self care tasks. Toilet transfer [facility] in bathroom with Min guard A and Min A for clothing management. Pt stood at the sink with RW to engage in grooming with Min Guard, reminded and required Max  VCs to keep BUE supported on walker for stabilization. Pt demonstrated don/ doff of sock in long sitting in bed (independent LLE, Max A RUE due to decreased ROM and function due to extension brace. Pt will benefit from continued therapy to address progression in  self care tasks to transition to preferred DC setting for further skilled OT services. Next session to address tub transfer or LB dressing.    Follow Up Recommendations  SNF;Supervision/Assistance - 24 hour    Equipment Recommendations  3 in 1 bedside commode    Recommendations for Other Services PT consult    Precautions / Restrictions Precautions Precautions: Fall Required Braces or Orthoses: Other Brace(Locked extension brace) Restrictions Weight Bearing Restrictions: Yes RLE Weight Bearing: Non weight bearing       Mobility Bed Mobility Overal bed mobility: Modified Independent                Transfers Overall transfer level: Needs assistance Equipment used: Rolling walker (2 wheeled) Transfers: Sit to/from Stand Sit to Stand: Min guard          General transfer comment: Min Guard A for safety. Cues for hand placement and sequencing for stand to sit transfer    Balance Overall balance assessment: Needs assistance Sitting-balance support: No upper extremity supported;Feet supported Sitting balance-Leahy Scale: Good     Standing balance support: Bilateral upper extremity supported;During functional activity Standing balance-Leahy Scale: Poor Standing balance comment: Reliant on UE support(Max VCs to keep BUE on walker for stabilization. )                           ADL either performed or assessed with clinical judgement   ADL Overall ADL's : Needs assistance/impaired     Grooming: Oral care;Set up;Wash/dry face;Cueing for safety;Cueing for sequencing;Standing(at sink level)               Lower Body Dressing: Supervision/safety;Bed level(Don/ doff L sock) Lower Body Dressing Details (indicate cue type and reason): Pt presented with no R sock. Pt educated on AE to assist with LB dressing. Supervision with LLE. Max A RLE Toilet Transfer: Min guard;Ambulation;RW Toilet Transfer Details (indicate cue type and reason): pt [facility] by ambulating to bathroom with RW.  Toileting- Clothing Manipulation and Hygiene: Minimal assistance Toileting - Clothing Manipulation Details (indicate cue type and reason): Min A to adjust clothing to sit due to decreased balance.      Functional mobility during ADLs: Min guard;Rolling walker;Minimal assistance(to bathroom, BSC and sink. ) General ADL Comments: Today's session addressed toileting, transfer, grooming (oral hygiene/ face wash), and LB dressing. Pt demonstrates increased standing tolerance but  was unable to finish sink tasks of oral hygiene due to pain of RLE.      Vision       Perception     Praxis      Cognition Arousal/Alertness: Awake/alert Behavior During Therapy: WFL for tasks assessed/performed;Impulsive Overall Cognitive  Status: Within Functional Limits for tasks assessed                                 General Comments: Pt required Max Verbal cueing to keep hands on walker to ensure safety in standing.         Exercises Exercises: General Lower Extremity General Exercises - Lower Extremity Ankle Circles/Pumps: AROM;Right;20 reps Quad Sets: AROM;Both;Supine;15 reps Hip ABduction/ADduction: AROM;Right;Supine;15 reps Straight Leg Raises: Right;Supine;AROM;15 reps   Shoulder Instructions       General Comments Pt reports throbbing pain of RLE during session. At the start of session, pt presented with swelling R foot while seated in recliner.     Pertinent Vitals/ Pain       Pain Assessment: 0-10 Pain Score: 8  Faces Pain Scale: Hurts little more Pain Location: R knee Pain Descriptors / Indicators: Sore;Throbbing Pain Intervention(s): Monitored during session;Repositioned;Limited activity within patient's tolerance  Home Living                                          Prior Functioning/Environment              Frequency  Min 2X/week        Progress Toward Goals  OT Goals(current goals can now be found in the care plan section)  Progress towards OT goals: Progressing toward goals  Acute Rehab OT Goals Patient Stated Goal: "Walk again" OT Goal Formulation: With patient Time For Goal Achievement: 12/18/18 Potential to Achieve Goals: Good ADL Goals Pt Will Perform Lower Body Dressing: with set-up;with supervision;sit to/from stand;with adaptive equipment Pt Will Transfer to Toilet: with set-up;with supervision;bedside commode;ambulating Pt Will Perform Toileting - Clothing Manipulation and hygiene: with set-up;with supervision;sit to/from stand Pt Will Perform Tub/Shower Transfer: Tub transfer;ambulating;3 in 1;rolling walker;with min guard assist  Plan Discharge plan remains appropriate;Frequency remains appropriate    Co-evaluation    PT/OT/SLP  Co-Evaluation/Treatment: (Dove tail)            AM-PAC OT "6 Clicks" Daily Activity     Outcome Measure   Help from another person eating meals?: None Help from another person taking care of personal grooming?: A Little Help from another person toileting, which includes using toliet, bedpan, or urinal?: A Little Help from another person bathing (including washing, rinsing, drying)?: A Lot Help from another person to put on and taking off regular upper body clothing?: None Help from another person to put on and taking off regular lower body clothing?: A Lot 6 Click Score: 18    End of Session Equipment Utilized During Treatment: Gait belt;Rolling walker  OT Visit Diagnosis: Unsteadiness on feet (R26.81);Other abnormalities of gait and mobility (R26.89);Muscle weakness (generalized) (M62.81);Pain Pain - Right/Left: Right Pain - part of body: Leg   Activity Tolerance Patient tolerated treatment well   Patient Left in bed;with call bell/phone within reach;with bed alarm set   Nurse Communication Mobility status        Time: [id number] OT Time Calculation (min): 17 min  Charges: OT General Charges $  OT Visit: 1 Visit OT Treatments $Self Care/Home Management : 8-22 mins  Marquette Old, MSOT, OTR/L  Supplemental Rehabilitation Services  [phone]   Zigmund Daniel 12/13/2018, 11:32 AM

## 2018-12-13 NOTE — Clinical Social Work Placement (Signed)
   CLINICAL SOCIAL WORK PLACEMENT  NOTE  Date:  12/13/2018  Patient Details  Name: [name] MRN: [id number] Date of Birth: 10/03/1976  Clinical Social Work is seeking post-discharge placement for this patient at the Skilled  Nursing Facility level of care (*CSW will initial, date and re-position this form in  chart as items are completed):      Patient/family provided with [facility] Clinical Social Work Department's list of facilities offering this level of care within the geographic area requested by the patient (or if unable, by the patient's family).  Yes   Patient/family informed of their freedom to choose among providers that offer the needed level of care, that participate in Medicare, Medicaid or managed care program needed by the patient, have an available bed and are willing to accept the patient.      Patient/family informed of [facility]'s ownership interest in [facility] and [facility], as well as of the fact that they are under no obligation to receive care at these facilities.  PASRR submitted to EDS on       PASRR number received on 12/12/18     Existing PASRR number confirmed on       FL2 transmitted to all facilities in geographic area requested by pt/family on 12/12/18     FL2 transmitted to all facilities within larger geographic area on       Patient informed that his/her managed care company has contracts with or will negotiate with certain facilities, including the following:        Yes   Patient/family informed of bed offers received.  Patient chooses bed at [facility]     Physician recommends and patient chooses bed at      Patient to be transferred to [facility] on 12/13/18.  Patient to be transferred to facility by PTAR     Patient family notified on 12/13/18 of transfer.  Name of family member notified:  [name] (guardian)     PHYSICIAN       Additional Comment:     _______________________________________________ Gildardo Griffes, LCSW 12/13/2018, 11:04 AM

## 2018-12-13 NOTE — Progress Notes (Signed)
AVS, printed prescriptions, and social worker's paperwork placed in discharge packet. Hinge brace in place. Report called and given to Gaspar Skeeters, Charity fundraiser at [place]. All questions answered to satisfaction. PTAR escorted pt off the unit via stretcher.

## 2018-12-13 NOTE — Progress Notes (Signed)
Patient will DC to: [facility] Anticipated DC date: 12/13/2018 Family notified: [name] (guardian) Transport WS:[id number]  Per MD patient ready for DC to . RN, patient, patient's family, and facility notified of DC. Discharge Summary sent to facility. RN given number for report [phone] . DC packet on chart. Ambulance transport requested for patient.  CSW signing off.  [place], [place] [phone]

## 2019-05-09 IMAGING — CR DG CHEST 1V PORT
1 series · 1 of 1 positions shown · non-contrast
Comparison: Most recent comparison 11/19/2016.

CLINICAL DATA: Shortness of breath for days.  Wheezing.

EXAM:
PORTABLE CHEST 1 VIEW

[pa]
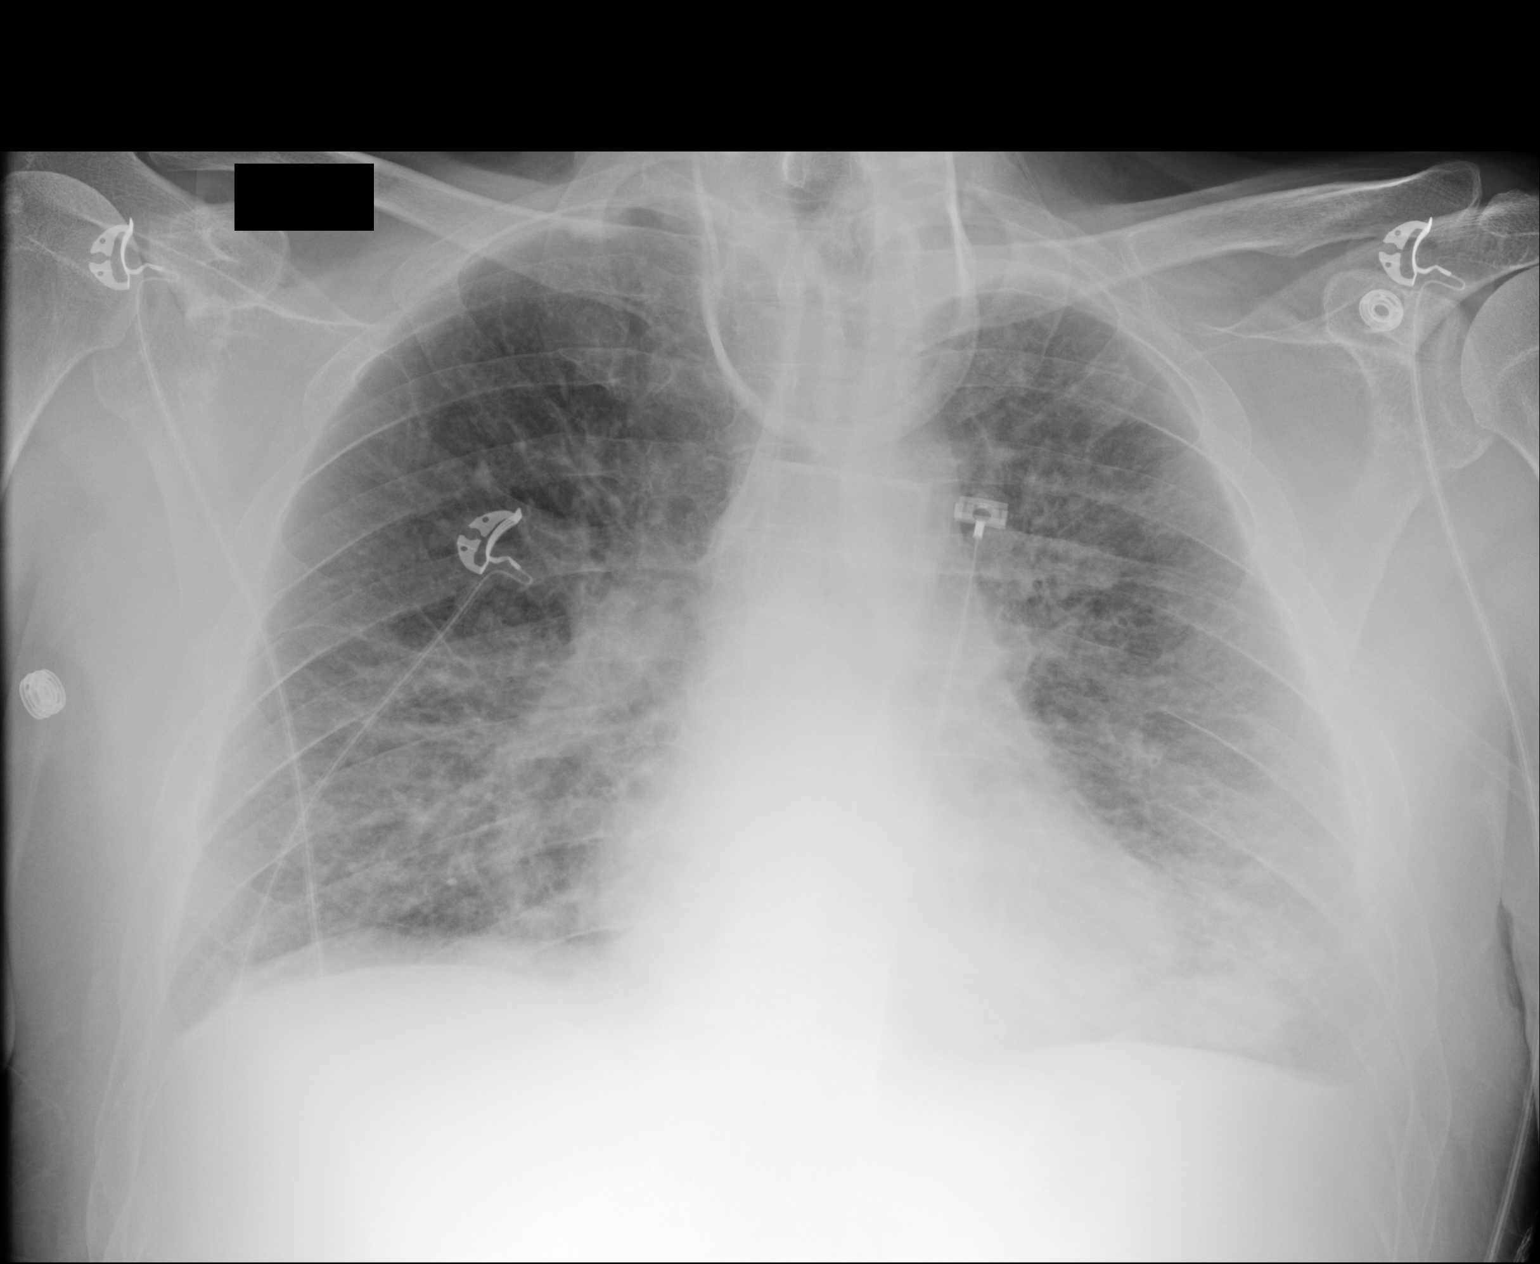

[1 of 1 positions shown; findings below may reference images not displayed]

FINDINGS: Mild hyperinflation. The heart is at the upper limits normal in
size. There is perihilar peribronchial cuffing. Patchy opacity at
the left lung base. Small left pleural effusion, decreased from most
recent comparison. No pneumothorax. No acute osseous abnormality.
IMPRESSION: Perihilar peribronchial cuffing may be interstitial edema or
bronchial thickening, this appears similar to prior exam.

Persistent but improved left pleural effusion, with patchy left
basilar opacity, likely atelectasis.

## 2019-05-28 IMAGING — CR DG CHEST 1V PORT
1 series · 2 of 2 positions shown · non-contrast
Comparison: Chest radiograph 04/03/2017

CLINICAL DATA: Patient with shortness of breath and lethargy.

EXAM:
PORTABLE CHEST 1 VIEW

[Series 1: portable · 0.17mm/px · 2 of 2 slices shown]
[im 1/2]
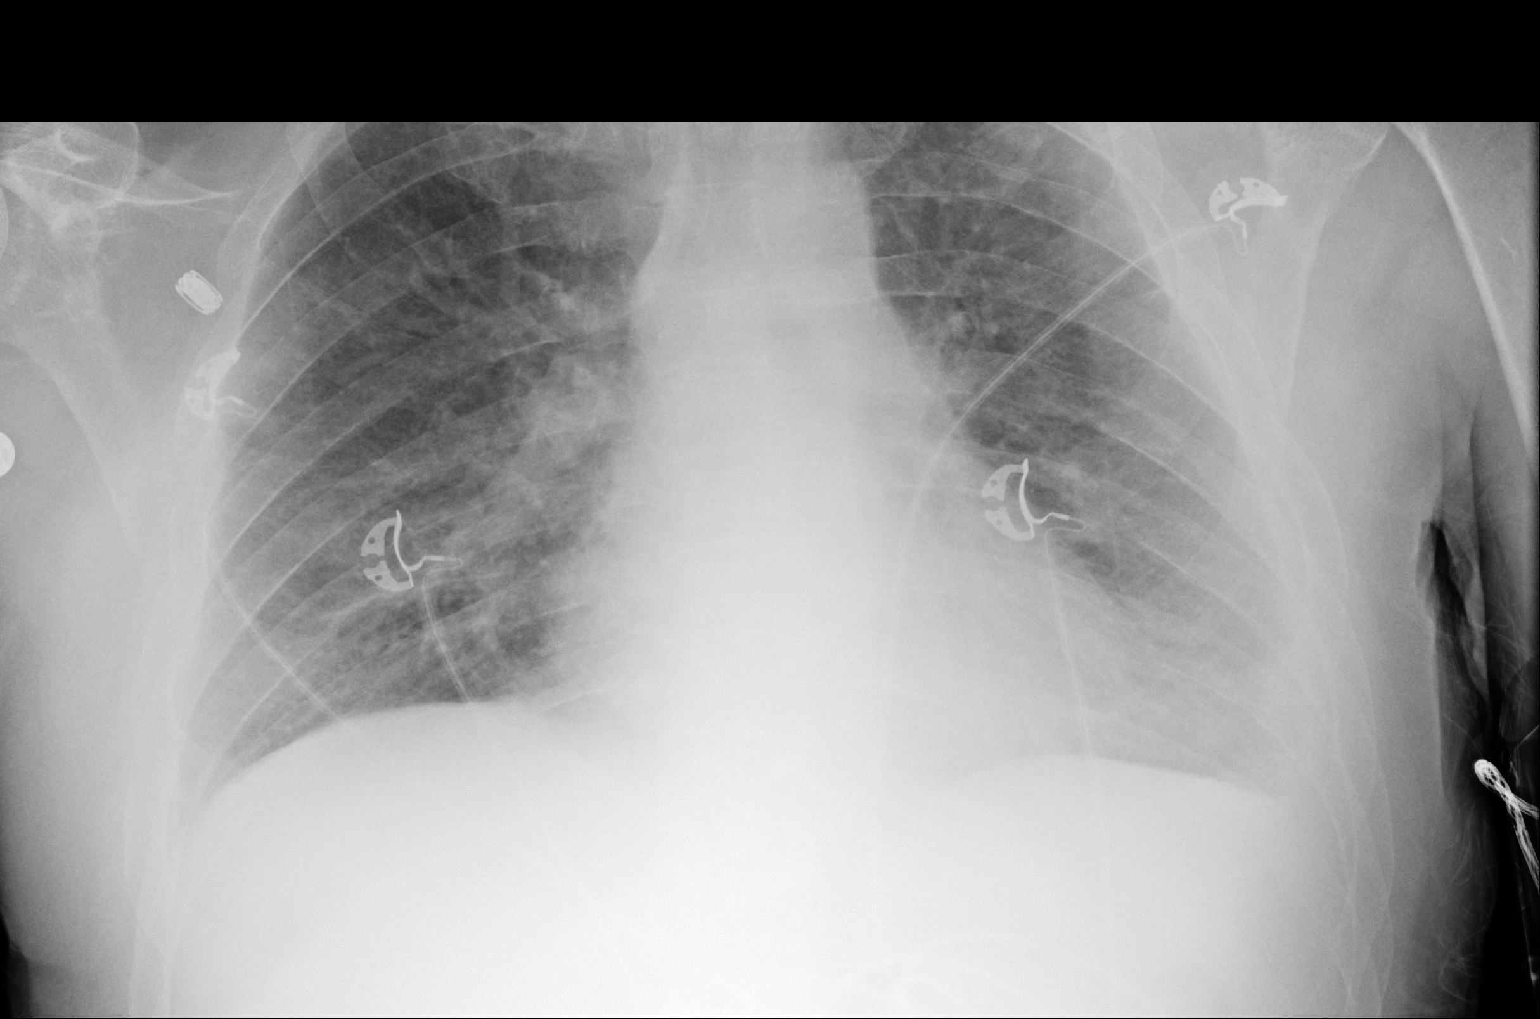
[im 2/2]
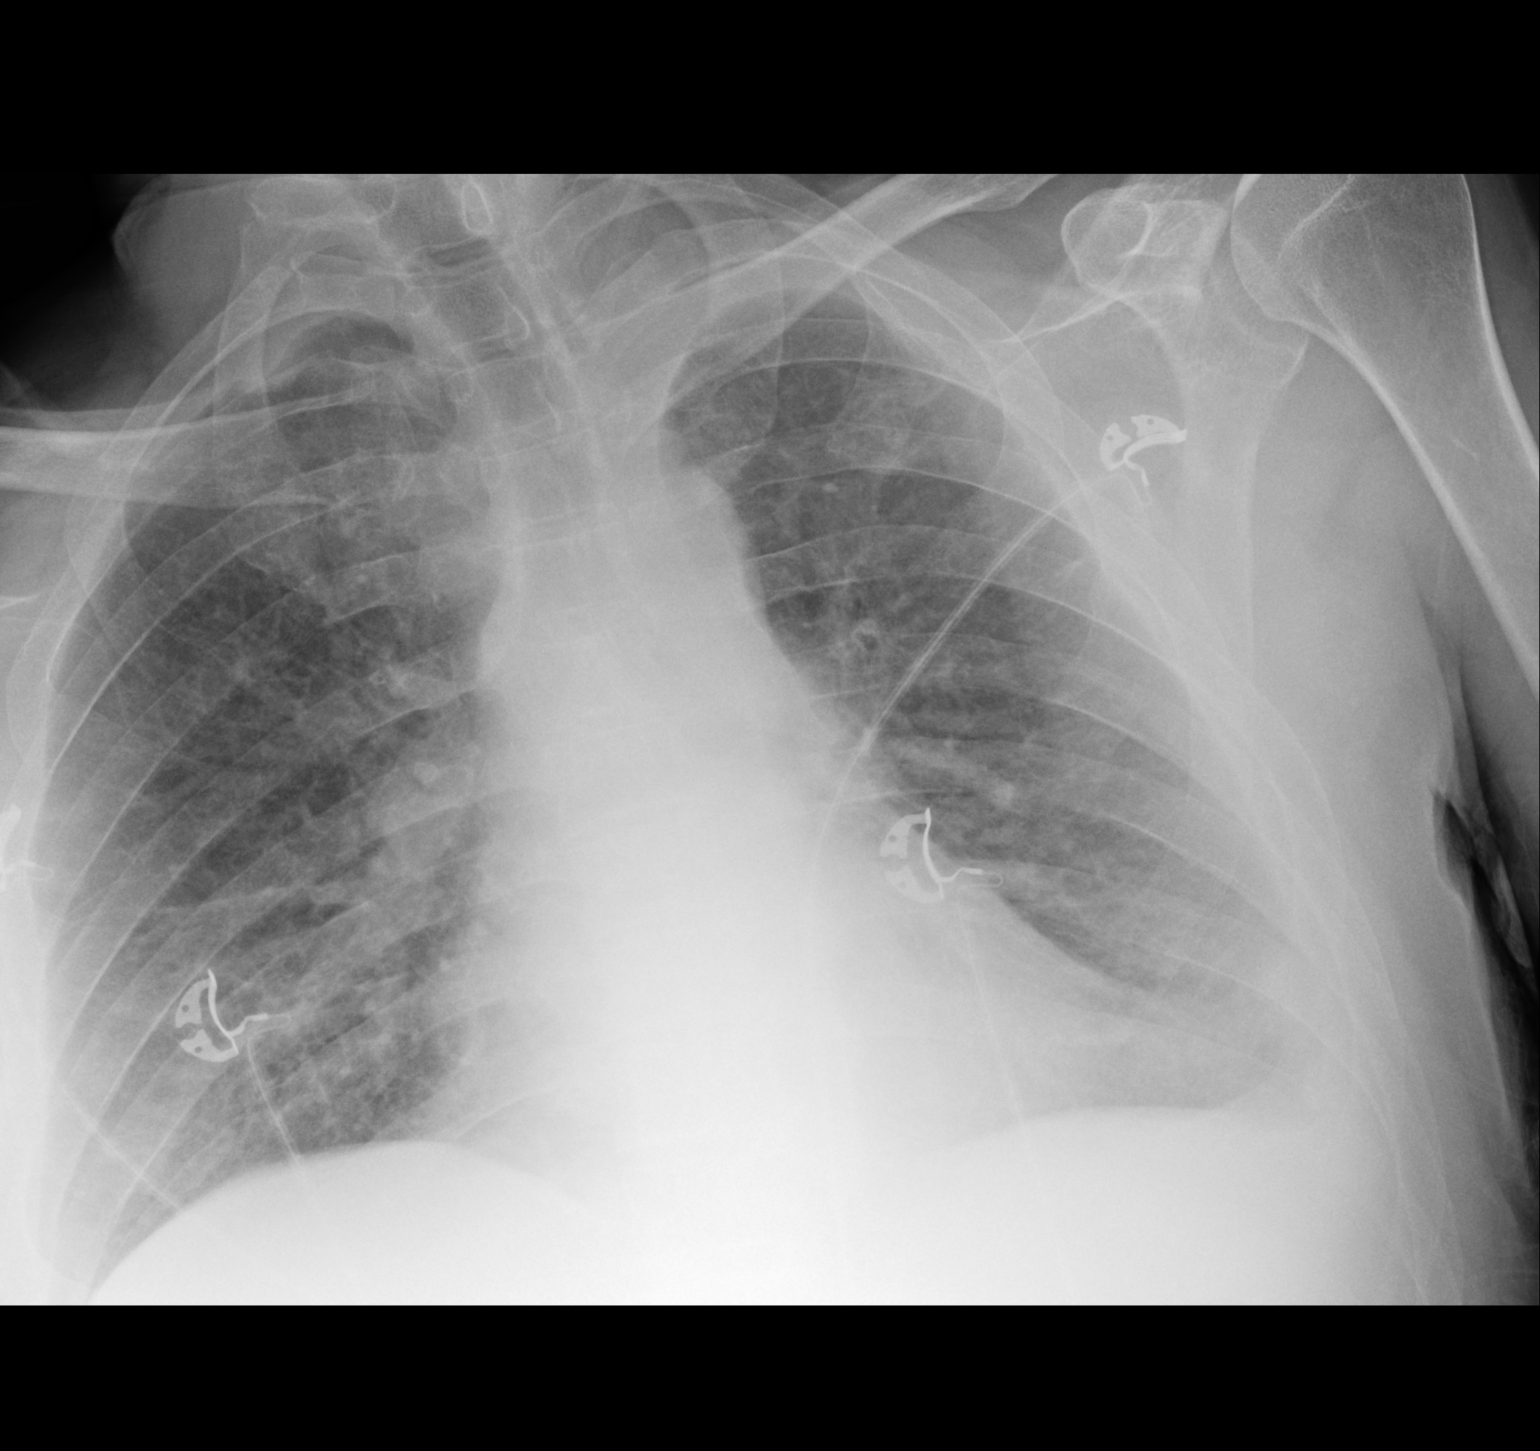

[2 of 2 positions shown; findings below may reference images not displayed]

FINDINGS: Monitoring leads overlie the patient. Stable cardiac and mediastinal
contours. Bilateral perihilar interstitial pulmonary opacities and
pulmonary vascular redistribution. Small bilateral pleural
effusions. No pneumothorax.
IMPRESSION: Perihilar interstitial opacities suggestive of mild interstitial
edema.

Small bilateral pleural effusions, left-greater-than-right.

## 2019-06-07 IMAGING — CT CT HEAD W/O CM
5 of 7 series · 17 of 47 positions shown, 18 images · non-contrast
Comparison: None.

CLINICAL DATA: Patient fell at [REDACTED]. Lethargy.
Altered mental status.

EXAM:
CT HEAD WITHOUT CONTRAST
CT CERVICAL SPINE WITHOUT CONTRAST
TECHNIQUE: Multidetector CT imaging of the head and cervical spine was
performed following the standard protocol without intravenous
contrast. Multiplanar CT image reconstructions of the cervical spine
were also generated.

[Series 3: head wo · axial · 0.42mm/px · z∈[+317,+372]mm · 2 of 33 slices shown, 3 images]
[im 11/33  brain]
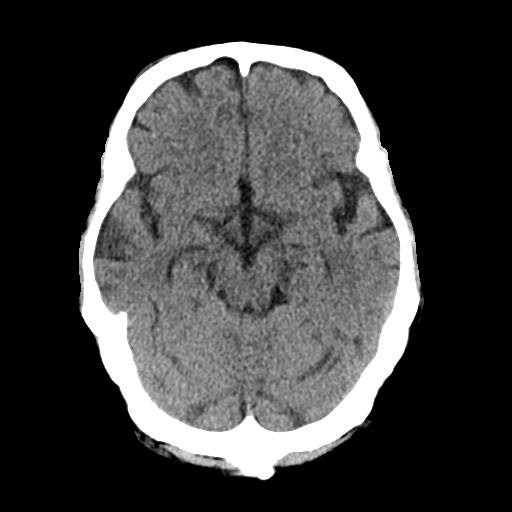
[im 11/33  bone]
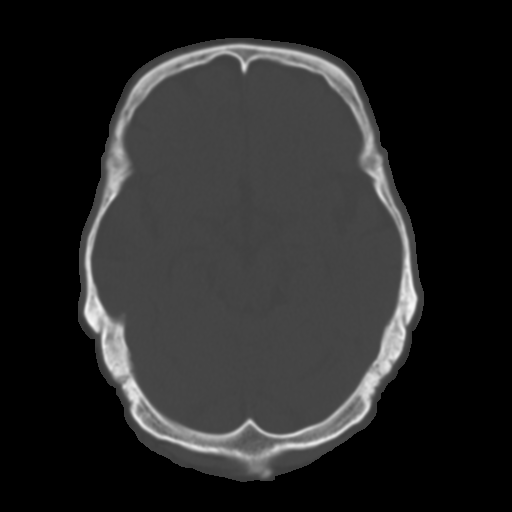
[im 22/33  brain]
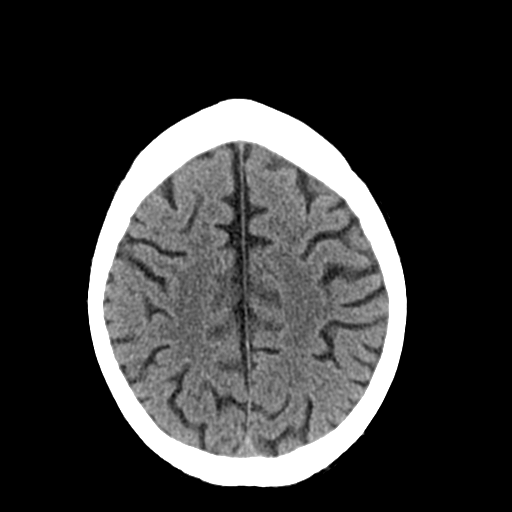

[Series 5: coronal soft tissue · coronal · 0.32mm/px · 3 of 81 slices shown]
[im 23/81  brain]
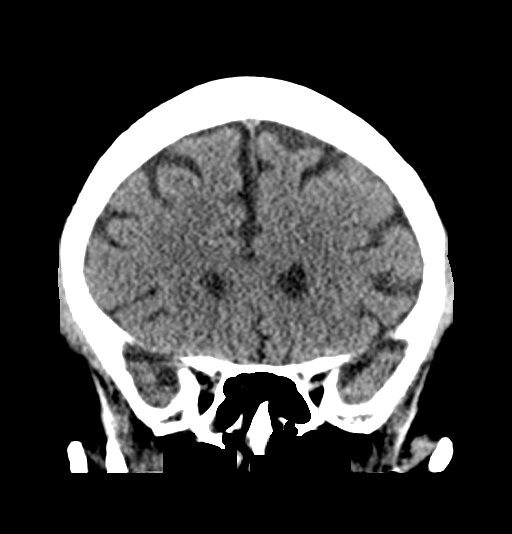
[im 35/81  brain]
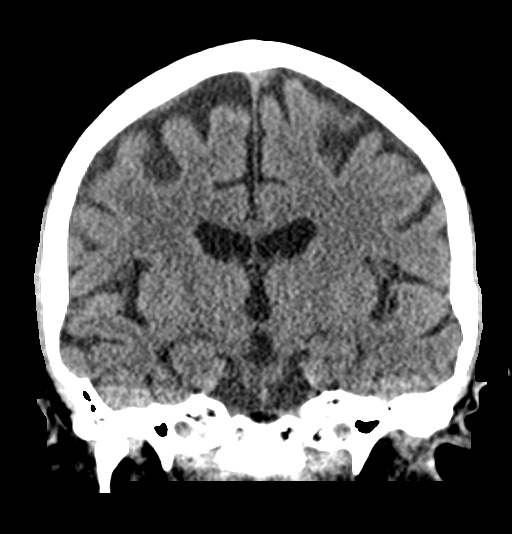
[im 46/81  brain]
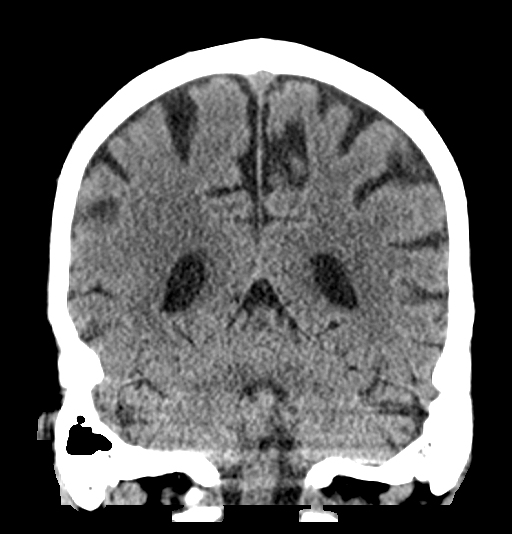

[Series 6: sagittal soft tissue · sagittal · 0.34mm/px · 1 of 56 slices shown]
[im 28/56  brain]
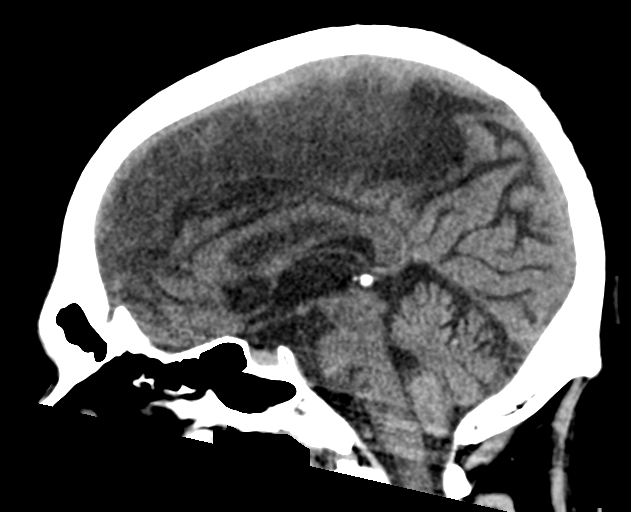

[Series 8: c spine soft · axial · 0.29mm/px · z∈[+86,+136]mm · 3 of 101 slices shown]
[im 9/101  brain]
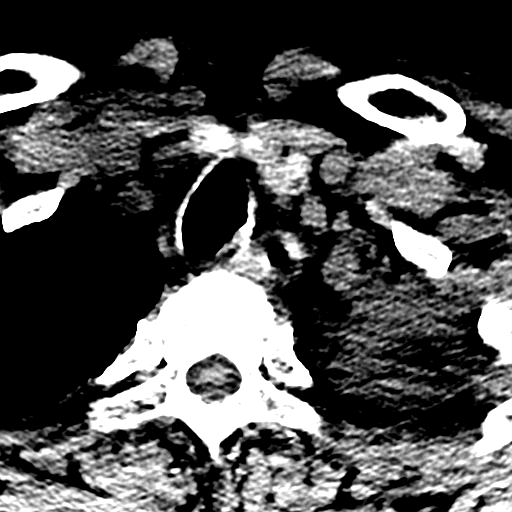
[im 26/101  brain]
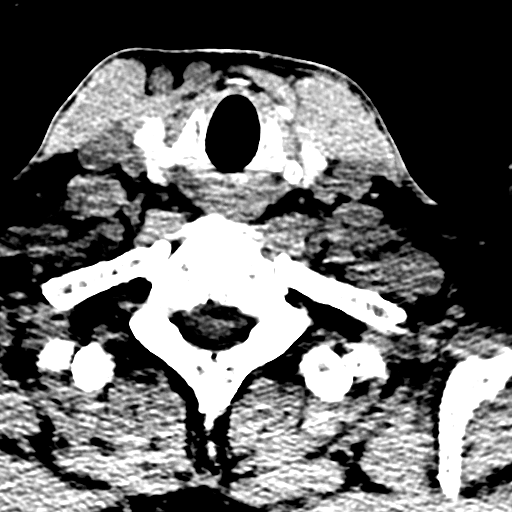
[im 34/101  brain]
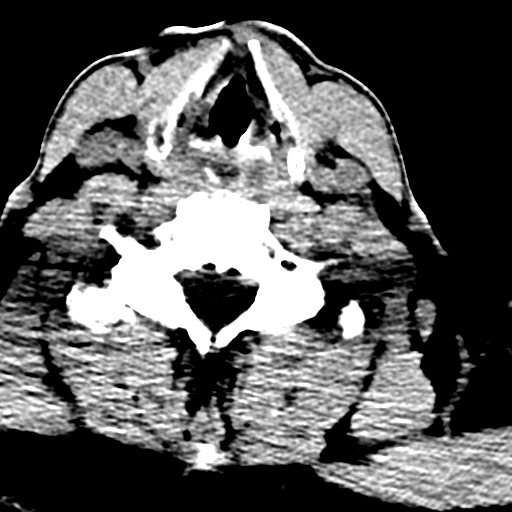

[Series 13: orthogonal bone · axial · 0.21mm/px · z∈[+71,+229]mm · 8 of 106 slices shown]
[im 9/106  bone]
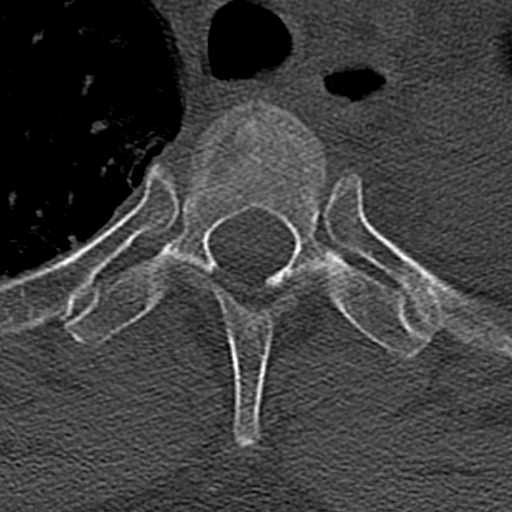
[im 27/106  bone]
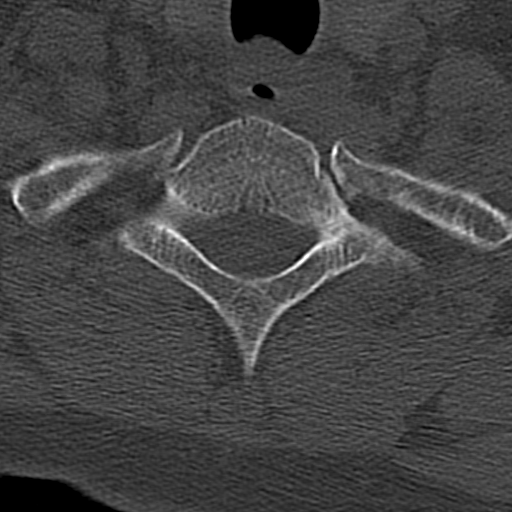
[im 36/106  bone]
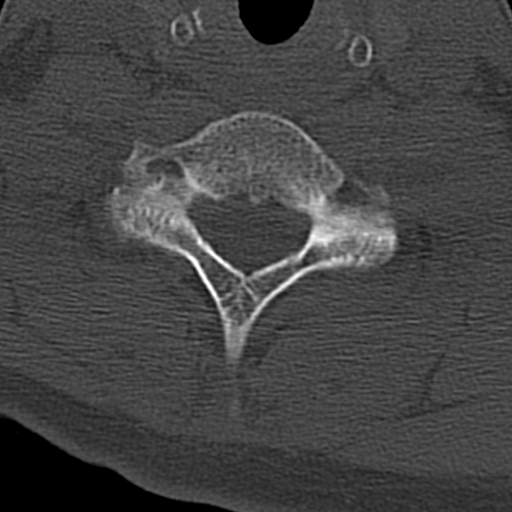
[im 44/106  bone]
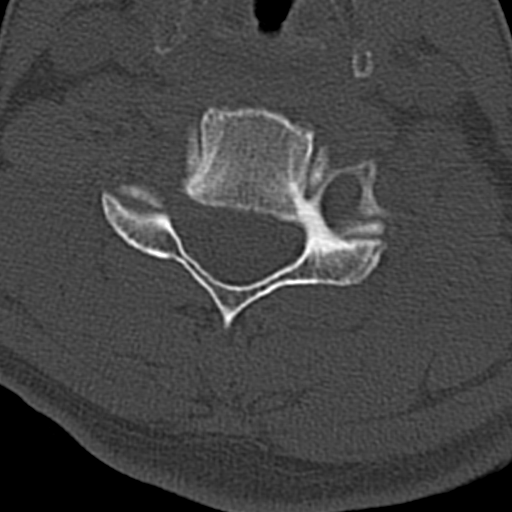
[im 62/106  bone]
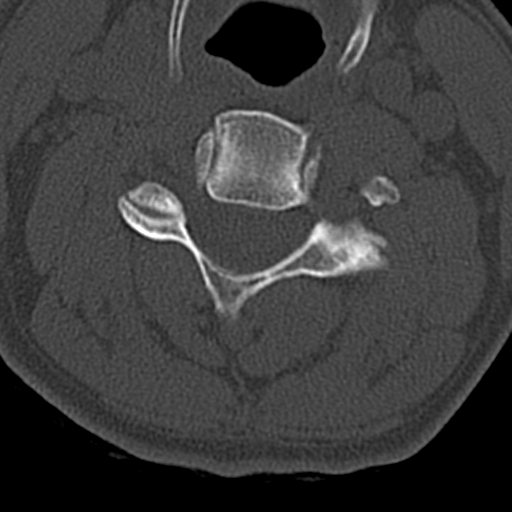
[im 71/106  bone]
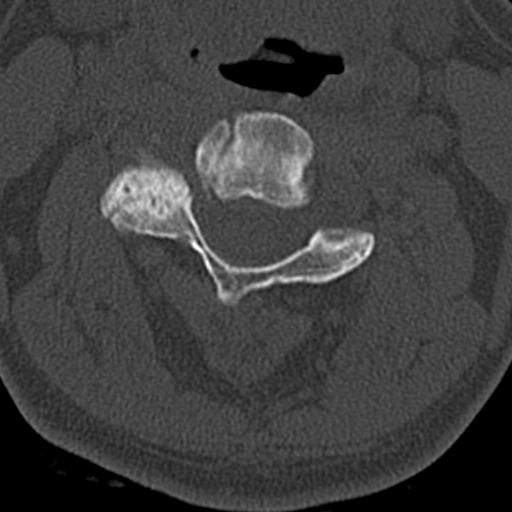
[im 79/106  bone]
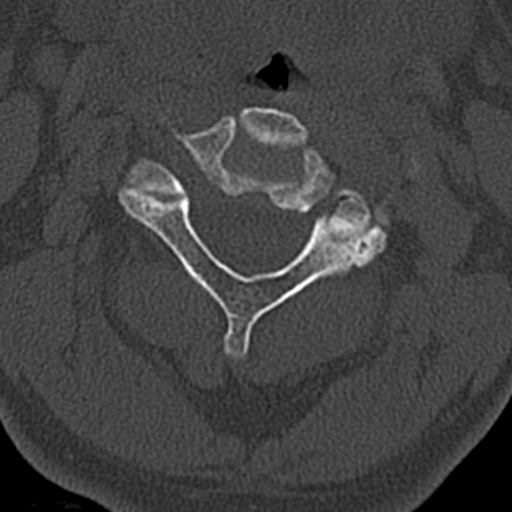
[im 97/106  bone]
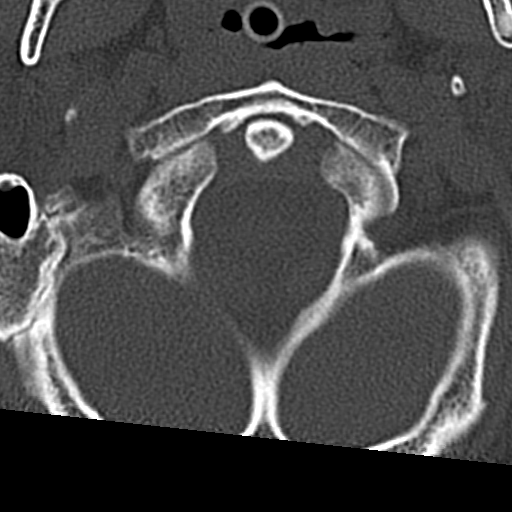

[17 of 47 positions shown; findings below may reference images not displayed]

FINDINGS: CT HEAD FINDINGS

Brain: Mild superficial atrophy. No acute intracranial hemorrhage,
midline shift or edema. No large vascular territory infarction. No
intra-axial mass. No extra-axial fluid collections.

Vascular: No hyperdense vessel or unexpected calcification.

Skull: Normal. Negative for fracture or focal lesion.

Sinuses/Orbits: No acute finding.

Other: None.

CT CERVICAL SPINE FINDINGS

Alignment: Reversal cervical lordosis which may be due to muscle
spasm or positioning. Minimal grade 1 anterolisthesis C3 on C4.
Craniocervical relationship and atlantodental interval are intact
and in normal alignment.

Skull base and vertebrae: No acute fracture. No primary bone lesion
or focal pathologic process.

Soft tissues and spinal canal: No prevertebral fluid or swelling. No
visible canal hematoma.

Disc levels: Normal no central canal stenosis or significant neural
foraminal encroachment. No jumped appearing facets. No focal disc
herniations.

Upper chest: Mild left apical pleural thickening.

Other: None
IMPRESSION: 1. Mild superficial atrophy for age. No acute intracranial
abnormality noted.
2. No acute cervical spine fracture.
3. Minimal 1 mm of grade 1 anterolisthesis of C3 on C4.

## 2019-06-07 IMAGING — CR DG CHEST 1V PORT
1 series · 1 of 1 positions shown · non-contrast
Comparison: 04/22/2017

CLINICAL DATA: Low back pain, unresponsive

EXAM:
PORTABLE CHEST 1 VIEW

[ap portable]
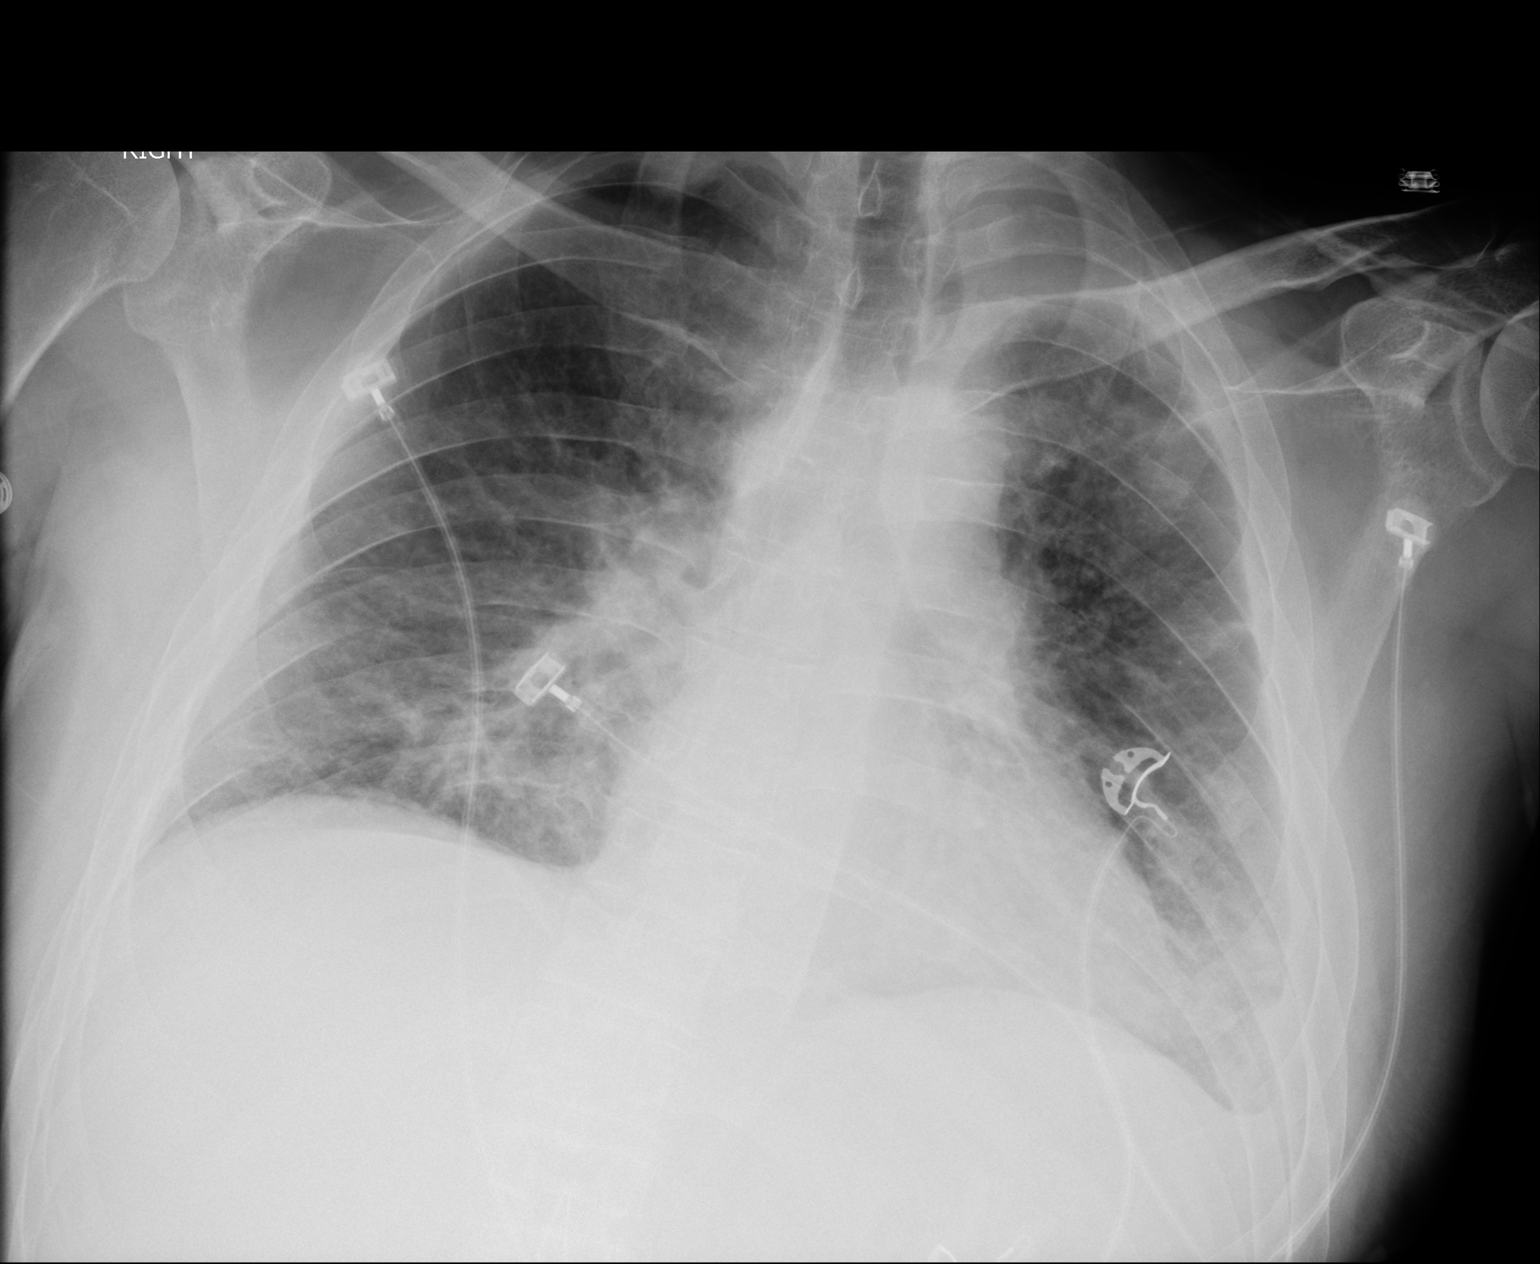

[1 of 1 positions shown; findings below may reference images not displayed]

FINDINGS: There are small bilateral pleural effusions. Borderline
cardiomegaly. Increased central vascular congestion and diffuse
interstitial and alveolar opacity which may reflect edema or
pneumonia. No pneumothorax.
IMPRESSION: Bilateral pleural effusions with increasing bilateral interstitial
and alveolar edema or infiltrates.

## 2019-06-11 IMAGING — CR DG CHEST 1V PORT
1 series · 1 of 1 positions shown · non-contrast
Comparison: 05/02/2017

CLINICAL DATA: Fever

EXAM:
PORTABLE CHEST 1 VIEW

[portable]
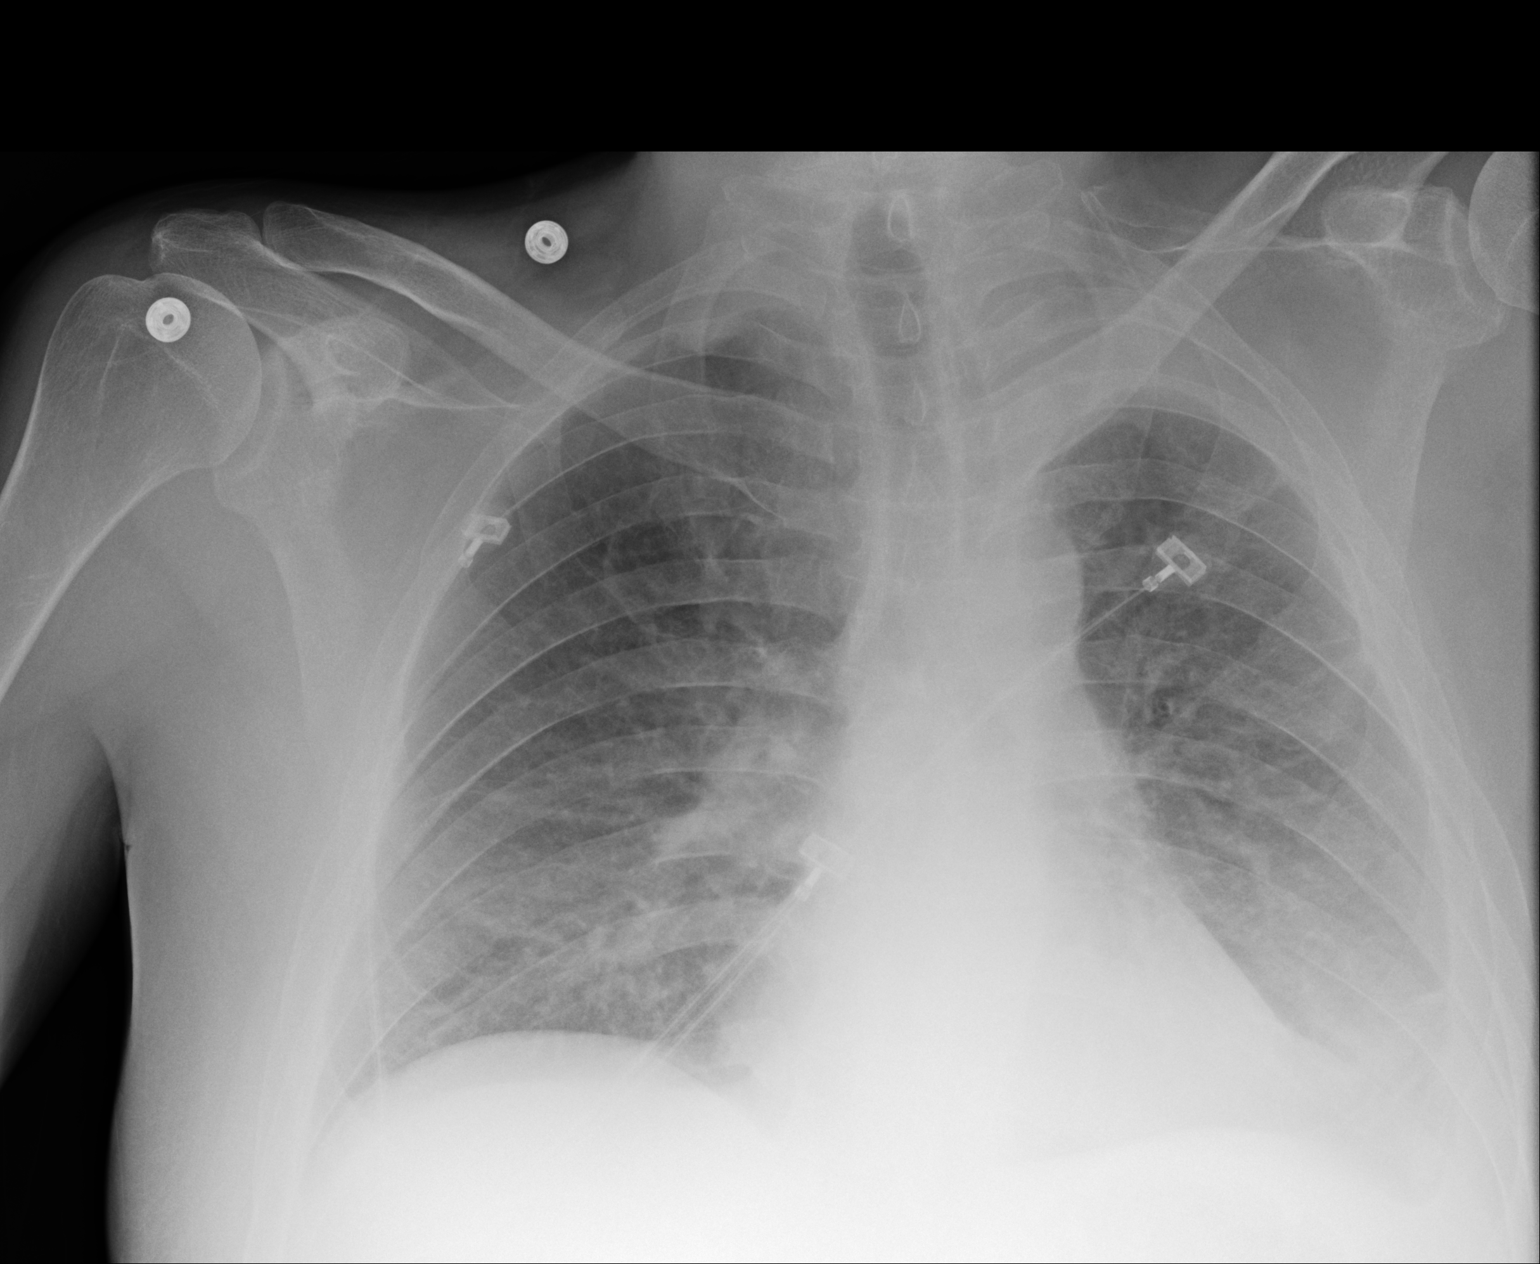

[1 of 1 positions shown; findings below may reference images not displayed]

FINDINGS: Diffuse bilateral airspace disease unchanged, probable pulmonary
edema. Mild bibasilar atelectasis also unchanged. Small left
effusion unchanged.
IMPRESSION: No significant interval change. Probable congestive heart failure
with edema and left effusion.

## 2019-11-05 ENCOUNTER — Ambulatory Visit: Payer: Self-pay | Admitting: Student

## 2019-11-05 DIAGNOSIS — S82101A Unspecified fracture of upper end of right tibia, initial encounter for closed fracture: Secondary | ICD-10-CM | POA: Insufficient documentation
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
## 2019-12-05 ENCOUNTER — Encounter (HOSPITAL_COMMUNITY): Payer: Self-pay | Admitting: Student

## 2019-12-05 ENCOUNTER — Other Ambulatory Visit: Payer: Self-pay

## 2019-12-05 NOTE — Pre-Procedure Instructions (Addendum)
    [name]  12/05/2019                 Surgery  is scheduled on Friday, December 06, 2019  Report to [facility] Admitting at 5:30 A.M.   Call this number if you have problems the morning of surgery: [phone]  This is the number for the Pre- Surgical Desk.  >>>>>Please send patient's Medication Record with medications administrated documentation. ( this information is required prior to OR. This includes medications that may have been on hold for surgery)<<<<<   Remember:  Do not eat  after midnight tonight.  You may drink clear liquids until 4:30 AM .  Clear liquids allowed are: Water, Juice (non-citric and without pulp), Carbonated beverages, Clear Tea, Black Coffee only, Plain Jell-O only, Gatorade and Plain Popsicles only.               >> I went over this with [name] , he said he drinks black coffee or coffee with sugar- which is ok.               After 4:30 AM  Do not drink anything.    Take these medicines the morning of surgery before 4:30 AM  busPIRone (BUSPAR)              divalproex (DEPAKOTE ER)             escitalopram (LEXAPRO)             esomeprazole (NEXIUM)              gabapentin (NEURONTIN)             primidone (MYSOLINE)             Use umeclidinium-vilanterol (ANORO ELLIPTA)  If needed take: Tylenol cyclobenzaprine (FLEXERIL)  oxyCODONE  Use if needed: Ipratropium-Albuterol (COMBIVENT RESPIMAT) Mucinex  >>>>I went over the following with [name], wear clean clothes, brush your teeth.  Do not wear jewelry, piercings  Do not wear lotions, powders, or perfumes, or deodorant.    Do not bring valuables to the hospital.  [facility] is not responsible for any belongings or valuables.    Orders per [facility] Consults - Dr Karna Christmas call physician today

## 2019-12-05 NOTE — Pre-Procedure Instructions (Signed)
    [name]  12/05/2019                 Surgery  is scheduled on Friday, December 06, 2019  Report to [facility] Admitting at 5:30 A.M.   Call this number if you have problems the morning of surgery: [phone]  This is the number for the Pre- Surgical Desk.  >>>>>Please send patient's Medication Record with medications administrated documentation. ( this information is required prior to OR. This includes medications that may have been on hold for surgery)<<<<<   Remember:  Do not eat  after midnight tonight.  You may drink clear liquids until 4:30 AM .  Clear liquids allowed are: Water, Juice (non-citric and without pulp), Carbonated beverages, Clear Tea, Black Coffee only, Plain Jell-O only, Gatorade and Plain Popsicles only.               >> I went over this with [name] , he said he drinks black coffee or coffee with sugar- which is ok.               After 4:30 AM  Do not drink anything.    Take these medicines the morning of surgery before 4:30 AM  busPIRone (BUSPAR)              divalproex (DEPAKOTE ER)             escitalopram (LEXAPRO)             esomeprazole (NEXIUM)              gabapentin (NEURONTIN)             primidone (MYSOLINE)             Use umeclidinium-vilanterol (ANORO ELLIPTA)  If needed take: Tylenol cyclobenzaprine (FLEXERIL)  oxyCODONE  Use if needed: Ipratropium-Albuterol (COMBIVENT RESPIMAT) Mucinex  >>>>I went over the following with [name], wear clean clothes, brush your teeth.  Do not wear jewelry, piercings  Do not wear lotions, powders, or perfumes, or deodorant.    Do not bring valuables to the hospital.  [facility] is not responsible for any belongings or valuables.    Orders per [facility] Consults - Dr Cassandria Anger

## 2019-12-05 NOTE — Progress Notes (Addendum)
Mr [name] denies chest pain or shortness of breath. I instructed Mr [name] to not smoke within 24 hours of surgery. Patient said that he has "never been told that and he will smoke." I went over ERAS diet , patient drinks black coffee or coffee with sugar.  Mr [name] states that he has never had seizures, he stated that he takes anti seizure medication for anxiety. Mr [name] is not waling much, because when he does it hurts. I also spoke with Britta Mccreedy, patient's nurse  For medical history and went over instructions.  Mr [name] is alert and oriented, he has a legal guardian, I have faxed consent to Forde Radon at [facility] to sign consent.

## 2019-12-06 ENCOUNTER — Ambulatory Visit (HOSPITAL_COMMUNITY): Payer: Medicare Other

## 2019-12-06 ENCOUNTER — Ambulatory Visit (HOSPITAL_COMMUNITY): Payer: Medicare Other | Admitting: Certified Registered Nurse Anesthetist

## 2019-12-06 ENCOUNTER — Ambulatory Visit (HOSPITAL_COMMUNITY)
Admission: RE | Admit: 2019-12-06 | Discharge: 2019-12-06 | Disposition: A | Discharge status: Home / Self Care | Payer: Medicare Other | Attending: Student | Admitting: Student

## 2019-12-06 ENCOUNTER — Other Ambulatory Visit: Payer: Self-pay

## 2019-12-06 ENCOUNTER — Encounter (HOSPITAL_COMMUNITY): Payer: Self-pay | Admitting: Student

## 2019-12-06 ENCOUNTER — Encounter (HOSPITAL_COMMUNITY)
Admission: RE | Disposition: A | Discharge status: Home / Self Care | Payer: Self-pay | Source: Home / Self Care | Attending: Student

## 2019-12-06 DIAGNOSIS — I110 Hypertensive heart disease with heart failure: Secondary | ICD-10-CM | POA: Insufficient documentation

## 2019-12-06 DIAGNOSIS — S82101A Unspecified fracture of upper end of right tibia, initial encounter for closed fracture: Secondary | ICD-10-CM

## 2019-12-06 DIAGNOSIS — X58XXXS Exposure to other specified factors, sequela: Secondary | ICD-10-CM | POA: Diagnosis not present

## 2019-12-06 DIAGNOSIS — F419 Anxiety disorder, unspecified: Secondary | ICD-10-CM | POA: Diagnosis not present

## 2019-12-06 DIAGNOSIS — Z20822 Contact with and (suspected) exposure to covid-19: Secondary | ICD-10-CM | POA: Insufficient documentation

## 2019-12-06 DIAGNOSIS — Z79899 Other long term (current) drug therapy: Secondary | ICD-10-CM | POA: Diagnosis not present

## 2019-12-06 DIAGNOSIS — S82141S Displaced bicondylar fracture of right tibia, sequela: Secondary | ICD-10-CM | POA: Insufficient documentation

## 2019-12-06 DIAGNOSIS — Z8249 Family history of ischemic heart disease and other diseases of the circulatory system: Secondary | ICD-10-CM | POA: Insufficient documentation

## 2019-12-06 DIAGNOSIS — F319 Bipolar disorder, unspecified: Secondary | ICD-10-CM | POA: Diagnosis not present

## 2019-12-06 DIAGNOSIS — I509 Heart failure, unspecified: Secondary | ICD-10-CM | POA: Insufficient documentation

## 2019-12-06 DIAGNOSIS — Z880 Allergy status to penicillin: Secondary | ICD-10-CM | POA: Insufficient documentation

## 2019-12-06 DIAGNOSIS — K219 Gastro-esophageal reflux disease without esophagitis: Secondary | ICD-10-CM | POA: Diagnosis not present

## 2019-12-06 DIAGNOSIS — F17200 Nicotine dependence, unspecified, uncomplicated: Secondary | ICD-10-CM | POA: Diagnosis not present

## 2019-12-06 DIAGNOSIS — Z419 Encounter for procedure for purposes other than remedying health state, unspecified: Secondary | ICD-10-CM

## 2019-12-06 DIAGNOSIS — Z472 Encounter for removal of internal fixation device: Secondary | ICD-10-CM | POA: Insufficient documentation

## 2019-12-06 DIAGNOSIS — M12561 Traumatic arthropathy, right knee: Secondary | ICD-10-CM | POA: Insufficient documentation

## 2019-12-06 DIAGNOSIS — Z7982 Long term (current) use of aspirin: Secondary | ICD-10-CM | POA: Diagnosis not present

## 2019-12-06 DIAGNOSIS — Z885 Allergy status to narcotic agent status: Secondary | ICD-10-CM | POA: Diagnosis not present

## 2019-12-06 DIAGNOSIS — J449 Chronic obstructive pulmonary disease, unspecified: Secondary | ICD-10-CM | POA: Diagnosis not present

## 2019-12-06 HISTORY — PX: HARDWARE REMOVAL: SHX979

## 2019-12-06 HISTORY — DX: Unspecified osteoarthritis, unspecified site: M19.90

## 2019-12-06 HISTORY — DX: Chronic obstructive pulmonary disease, unspecified: J44.9

## 2019-12-06 LAB — BASIC METABOLIC PANEL
Anion gap: 8 (ref 5–15)
BUN: 8 mg/dL (ref 6–20)
CO2: 25 mmol/L (ref 22–32)
Calcium: 9.1 mg/dL (ref 8.9–10.3)
Chloride: 102 mmol/L (ref 98–111)
Creatinine, Ser: 0.73 mg/dL (ref 0.61–1.24)
GFR calc Af Amer: >60 mL/min (ref >60)
GFR calc non Af Amer: >60 mL/min (ref >60)
Glucose, Bld: 91 mg/dL (ref 70–99)
Potassium: 4.3 mmol/L (ref 3.5–5.1)
Sodium: 135 mmol/L (ref 135–145)

## 2019-12-06 LAB — CBC
HCT: 44.6 % (ref 39.0–52.0)
Hemoglobin: 15.9 g/dL (ref 13.0–17.0)
MCH: 33.2 pg (ref 26.0–34.0)
MCHC: 35.7 g/dL (ref 30.0–36.0)
MCV: 93.1 fL (ref 80.0–100.0)
Platelets: 183 10*3/uL (ref 150–400)
RBC: 4.79 MIL/uL (ref 4.22–5.81)
RDW: 12.7 % (ref 11.5–15.5)
WBC: 5.9 10*3/uL (ref 4.0–10.5)
nRBC: 0 % (ref 0.0–0.2)

## 2019-12-06 LAB — RESPIRATORY PANEL BY RT PCR (FLU A&B, COVID)
Influenza A by PCR: NEGATIVE
Influenza B by PCR: NEGATIVE
SARS Coronavirus 2 by RT PCR: NEGATIVE

## 2019-12-06 SURGERY — REMOVAL, HARDWARE
Anesthesia: General | Laterality: Right

## 2019-12-06 MED ORDER — LIDOCAINE 2% (20 MG/ML) 5 ML SYRINGE
INTRAMUSCULAR | Status: AC
  Filled 2019-12-06: qty 5

## 2019-12-06 MED ORDER — DEXAMETHASONE SODIUM PHOSPHATE 10 MG/ML IJ SOLN
INTRAMUSCULAR | Status: AC
  Filled 2019-12-06: qty 1

## 2019-12-06 MED ORDER — OXYCODONE HCL 10 MG PO TABS: 10.0000 mg | ORAL_TABLET | Freq: Four times a day (QID) | ORAL | 0 refills | Status: DC | PRN

## 2019-12-06 MED ORDER — MIDAZOLAM HCL 2 MG/2ML IJ SOLN
INTRAMUSCULAR | Status: AC
  Filled 2019-12-06: qty 2

## 2019-12-06 MED ORDER — ONDANSETRON HCL 4 MG/2ML IJ SOLN
INTRAMUSCULAR | Status: DC | PRN
  Administered 2019-12-06: 4 mg via INTRAVENOUS

## 2019-12-06 MED ORDER — OXYCODONE HCL 5 MG PO TABS
5.0000 mg | ORAL_TABLET | Freq: Once | ORAL | Status: AC | PRN
  Administered 2019-12-06: 09:00:00 5 mg via ORAL

## 2019-12-06 MED ORDER — PROPOFOL 10 MG/ML IV BOLUS
INTRAVENOUS | Status: AC
  Filled 2019-12-06: qty 40

## 2019-12-06 MED ORDER — MEPERIDINE HCL 25 MG/ML IJ SOLN: 6.2500 mg | INTRAMUSCULAR | Status: DC | PRN

## 2019-12-06 MED ORDER — ASPIRIN 325 MG PO TBEC: 325.0000 mg | DELAYED_RELEASE_TABLET | Freq: Every day | ORAL | 0 refills | Status: AC

## 2019-12-06 MED ORDER — HYDROMORPHONE HCL 1 MG/ML IJ SOLN
INTRAMUSCULAR | Status: AC
  Filled 2019-12-06: qty 1

## 2019-12-06 MED ORDER — CHLORHEXIDINE GLUCONATE 4 % EX LIQD: 60.0000 mL | Freq: Once | CUTANEOUS | Status: DC

## 2019-12-06 MED ORDER — FENTANYL CITRATE (PF) 250 MCG/5ML IJ SOLN
INTRAMUSCULAR | Status: AC
  Filled 2019-12-06: qty 5

## 2019-12-06 MED ORDER — OXYCODONE HCL 5 MG/5ML PO SOLN: 5.0000 mg | Freq: Once | ORAL | Status: AC | PRN

## 2019-12-06 MED ORDER — VANCOMYCIN HCL 1000 MG IV SOLR
INTRAVENOUS | Status: AC
  Filled 2019-12-06: qty 1000

## 2019-12-06 MED ORDER — VANCOMYCIN HCL 1000 MG IV SOLR
INTRAVENOUS | Status: DC | PRN
  Administered 2019-12-06: 1000 mg via TOPICAL

## 2019-12-06 MED ORDER — CEFAZOLIN SODIUM-DEXTROSE 2-4 GM/100ML-% IV SOLN
2.0000 g | INTRAVENOUS | Status: AC
  Administered 2019-12-06: 08:00:00 2 g via INTRAVENOUS
  Filled 2019-12-06: qty 100

## 2019-12-06 MED ORDER — LACTATED RINGERS IV SOLN: INTRAVENOUS | Status: DC | PRN

## 2019-12-06 MED ORDER — MIDAZOLAM HCL 5 MG/5ML IJ SOLN
INTRAMUSCULAR | Status: DC | PRN
  Administered 2019-12-06: 2 mg via INTRAVENOUS

## 2019-12-06 MED ORDER — LIDOCAINE 2% (20 MG/ML) 5 ML SYRINGE
INTRAMUSCULAR | Status: DC | PRN
  Administered 2019-12-06: 100 mg via INTRAVENOUS

## 2019-12-06 MED ORDER — OXYCODONE HCL 5 MG PO TABS
ORAL_TABLET | ORAL | Status: AC
  Filled 2019-12-06: qty 1

## 2019-12-06 MED ORDER — 0.9 % SODIUM CHLORIDE (POUR BTL) OPTIME
TOPICAL | Status: DC | PRN
  Administered 2019-12-06: 08:00:00 1000 mL

## 2019-12-06 MED ORDER — FENTANYL CITRATE (PF) 250 MCG/5ML IJ SOLN
INTRAMUSCULAR | Status: DC | PRN
  Administered 2019-12-06: 100 ug via INTRAVENOUS
  Administered 2019-12-06 (×3): 50 ug via INTRAVENOUS

## 2019-12-06 MED ORDER — HYDROMORPHONE HCL 1 MG/ML IJ SOLN
0.2500 mg | INTRAMUSCULAR | Status: DC | PRN
  Administered 2019-12-06 (×3): 0.5 mg via INTRAVENOUS

## 2019-12-06 MED ORDER — ONDANSETRON HCL 4 MG/2ML IJ SOLN
INTRAMUSCULAR | Status: AC
  Filled 2019-12-06: qty 2

## 2019-12-06 MED ORDER — PROPOFOL 10 MG/ML IV BOLUS
INTRAVENOUS | Status: DC | PRN
  Administered 2019-12-06: 200 mg via INTRAVENOUS

## 2019-12-06 MED ORDER — PROMETHAZINE HCL 25 MG/ML IJ SOLN: 6.2500 mg | INTRAMUSCULAR | Status: DC | PRN

## 2019-12-06 MED ORDER — DEXAMETHASONE SODIUM PHOSPHATE 10 MG/ML IJ SOLN
INTRAMUSCULAR | Status: DC | PRN
  Administered 2019-12-06: 5 mg via INTRAVENOUS

## 2019-12-06 SURGICAL SUPPLY — 62 items
BANDAGE ESMARK 6X9 LF (GAUZE/BANDAGES/DRESSINGS) ×1 IMPLANT
BNDG COHESIVE 6X5 TAN STRL LF (GAUZE/BANDAGES/DRESSINGS) ×2 IMPLANT
BNDG ELASTIC 4X5.8 VLCR STR LF (GAUZE/BANDAGES/DRESSINGS) ×2 IMPLANT
BNDG ELASTIC 6X10 VLCR STRL LF (GAUZE/BANDAGES/DRESSINGS) ×1 IMPLANT
BNDG ELASTIC 6X5.8 VLCR STR LF (GAUZE/BANDAGES/DRESSINGS) ×2 IMPLANT
BNDG ESMARK 6X9 LF (GAUZE/BANDAGES/DRESSINGS) ×2
BNDG GAUZE ELAST 4 BULKY (GAUZE/BANDAGES/DRESSINGS) ×4 IMPLANT
BRUSH SCRUB EZ PLAIN DRY (MISCELLANEOUS) ×4 IMPLANT
CHLORAPREP W/TINT 26 (MISCELLANEOUS) ×2 IMPLANT
COVER SURGICAL LIGHT HANDLE (MISCELLANEOUS) ×4 IMPLANT
COVER WAND RF STERILE (DRAPES) ×1 IMPLANT
CUFF TOURN SGL QUICK 18X4 (TOURNIQUET CUFF) IMPLANT
CUFF TOURN SGL QUICK 24 (TOURNIQUET CUFF)
CUFF TOURN SGL QUICK 34 (TOURNIQUET CUFF) ×1
CUFF TRNQT CYL 24X4X16.5-23 (TOURNIQUET CUFF) IMPLANT
CUFF TRNQT CYL 34X4.125X (TOURNIQUET CUFF) IMPLANT
DRAPE C-ARM 42X72 X-RAY (DRAPES) ×1 IMPLANT
DRAPE C-ARMOR (DRAPES) ×2 IMPLANT
DRAPE U-SHAPE 47X51 STRL (DRAPES) ×2 IMPLANT
DRSG ADAPTIC 3X8 NADH LF (GAUZE/BANDAGES/DRESSINGS) ×2 IMPLANT
ELECT REM PT RETURN 9FT ADLT (ELECTROSURGICAL) ×2
ELECTRODE REM PT RTRN 9FT ADLT (ELECTROSURGICAL) ×1 IMPLANT
GAUZE SPONGE 4X4 12PLY STRL (GAUZE/BANDAGES/DRESSINGS) ×2 IMPLANT
GLOVE BIO SURGEON STRL SZ 6.5 (GLOVE) ×6 IMPLANT
GLOVE BIO SURGEON STRL SZ7.5 (GLOVE) ×8 IMPLANT
GLOVE BIOGEL PI IND STRL 6.5 (GLOVE) ×1 IMPLANT
GLOVE BIOGEL PI IND STRL 7.5 (GLOVE) ×1 IMPLANT
GLOVE BIOGEL PI INDICATOR 6.5 (GLOVE) ×1
GLOVE BIOGEL PI INDICATOR 7.5 (GLOVE) ×1
GOWN STRL REUS W/ TWL LRG LVL3 (GOWN DISPOSABLE) ×2 IMPLANT
GOWN STRL REUS W/TWL LRG LVL3 (GOWN DISPOSABLE) ×2
KIT BASIN OR (CUSTOM PROCEDURE TRAY) ×2 IMPLANT
KIT TURNOVER KIT B (KITS) ×2 IMPLANT
MANIFOLD NEPTUNE II (INSTRUMENTS) ×1 IMPLANT
NEEDLE 22X1 1/2 (OR ONLY) (NEEDLE) IMPLANT
NS IRRIG 1000ML POUR BTL (IV SOLUTION) ×2 IMPLANT
PACK ORTHO EXTREMITY (CUSTOM PROCEDURE TRAY) ×2 IMPLANT
PAD ARMBOARD 7.5X6 YLW CONV (MISCELLANEOUS) ×4 IMPLANT
PAD CAST 4YDX4 CTTN HI CHSV (CAST SUPPLIES) IMPLANT
PADDING CAST COTTON 4X4 STRL (CAST SUPPLIES) ×1
PADDING CAST COTTON 6X4 STRL (CAST SUPPLIES) ×4 IMPLANT
SPONGE LAP 18X18 RF (DISPOSABLE) ×2 IMPLANT
STAPLER VISISTAT 35W (STAPLE) IMPLANT
STOCKINETTE IMPERVIOUS LG (DRAPES) ×2 IMPLANT
STRIP CLOSURE SKIN 1/2X4 (GAUZE/BANDAGES/DRESSINGS) IMPLANT
SUCTION FRAZIER HANDLE 10FR (MISCELLANEOUS)
SUCTION TUBE FRAZIER 10FR DISP (MISCELLANEOUS) IMPLANT
SUT ETHILON 3 0 PS 1 (SUTURE) IMPLANT
SUT MNCRL AB 3-0 PS2 18 (SUTURE) ×3 IMPLANT
SUT MON AB 2-0 CT1 36 (SUTURE) ×1 IMPLANT
SUT PDS AB 2-0 CT1 27 (SUTURE) IMPLANT
SUT VIC AB 0 CT1 27 (SUTURE) ×2
SUT VIC AB 0 CT1 27XBRD ANBCTR (SUTURE) IMPLANT
SUT VIC AB 2-0 CT1 27 (SUTURE) ×2
SUT VIC AB 2-0 CT1 TAPERPNT 27 (SUTURE) IMPLANT
SYR CONTROL 10ML LL (SYRINGE) IMPLANT
TOWEL GREEN STERILE (TOWEL DISPOSABLE) ×4 IMPLANT
TOWEL GREEN STERILE FF (TOWEL DISPOSABLE) ×4 IMPLANT
TUBE CONNECTING 12X1/4 (SUCTIONS) ×2 IMPLANT
UNDERPAD 30X30 (UNDERPADS AND DIAPERS) ×2 IMPLANT
WATER STERILE IRR 1000ML POUR (IV SOLUTION) ×4 IMPLANT
YANKAUER SUCT BULB TIP NO VENT (SUCTIONS) ×2 IMPLANT

## 2019-12-06 NOTE — Interval H&P Note (Signed)
History and Physical Interval Note:  12/06/2019 7:24 AM  [name]  has presented today for surgery, with the diagnosis of Painful right tibial hardware.  The various methods of treatment have been discussed with the patient and family. After consideration of risks, benefits and other options for treatment, the patient has consented to  Procedure(s): HARDWARE REMOVAL RIGHT TIBIAL PLATEAU (Right) as a surgical intervention.  The patient's history has been reviewed, patient examined, no change in status, stable for surgery.  I have reviewed the patient's chart and labs.  Questions were answered to the patient's satisfaction.     Caryn Bee P Inioluwa Boulay

## 2019-12-06 NOTE — Anesthesia Procedure Notes (Signed)
Procedure Name: LMA Insertion Date/Time: 12/06/2019 7:31 AM Performed by: Samara Deist, CRNA Pre-anesthesia Checklist: Patient identified, Emergency Drugs available, Suction available, Patient being monitored and Timeout performed Patient Re-evaluated:Patient Re-evaluated prior to induction Oxygen Delivery Method: Circle system utilized Preoxygenation: Pre-oxygenation with 100% oxygen Induction Type: IV induction LMA: LMA inserted LMA Size: 5.0 Number of attempts: 1 Placement Confirmation: ETT inserted through vocal cords under direct vision,  positive ETCO2 and breath sounds checked- equal and bilateral Tube secured with: Tape Dental Injury: Teeth and Oropharynx as per pre-operative assessment

## 2019-12-06 NOTE — Discharge Instructions (Signed)
Orthopaedic Trauma Service Discharge Instructions   General Discharge Instructions  WEIGHT BEARING STATUS: Weightbearing as tolerated right lower extremity  RANGE OF MOTION/ACTIVITY: Unrestricted range of motion right knee  Wound Care: Okay to remove surgical dressing on postop day #2 (Sunday, 12/08/2019).  Incisions can be left open to air if there is no drainage. If incision continues to have drainage, follow wound care instructions below. Okay to shower if no drainage from incisions.  Leave Steri-Strips in place, okay to shower and get Steri-Strips wet  DVT/PE prophylaxis: Aspirin 325 mg daily x30 days.  Once this prescription is finished okay to continue 81 mg daily  Diet: as you were eating previously.  Can use over the counter stool softeners and bowel preparations, such as Miralax, to help with bowel movements.  Narcotics can be constipating.  Be sure to drink plenty of fluids  PAIN MEDICATION USE AND EXPECTATIONS  You have likely been given narcotic medications to help control your pain.  After a traumatic event that results in an fracture (broken bone) with or without surgery, it is ok to use narcotic pain medications to help control one's pain.  We understand that everyone responds to pain differently and each individual patient will be evaluated on a regular basis for the continued need for narcotic medications. Ideally, narcotic medication use should last no more than 6-8 weeks (coinciding with fracture healing).   As a patient it is your responsibility as well to monitor narcotic medication use and report the amount and frequency you use these medications when you come to your office visit.   We would also advise that if you are using narcotic medications, you should take a dose prior to therapy to maximize you participation.  IF YOU ARE ON NARCOTIC MEDICATIONS IT IS NOT PERMISSIBLE TO OPERATE A MOTOR VEHICLE (MOTORCYCLE/CAR/TRUCK/MOPED) OR HEAVY MACHINERY DO NOT MIX NARCOTICS  WITH OTHER CNS (CENTRAL NERVOUS SYSTEM) DEPRESSANTS SUCH AS ALCOHOL   STOP SMOKING OR USING NICOTINE PRODUCTS!!!!  As discussed nicotine severely impairs your body's ability to heal surgical and traumatic wounds but also impairs bone healing.  Wounds and bone heal by forming microscopic blood vessels (angiogenesis) and nicotine is a vasoconstrictor (essentially, shrinks blood vessels).  Therefore, if vasoconstriction occurs to these microscopic blood vessels they essentially disappear and are unable to deliver necessary nutrients to the healing tissue.  This is one modifiable factor that you can do to dramatically increase your chances of healing your injury.    (This means no smoking, no nicotine gum, patches, etc)  DO NOT USE NONSTEROIDAL ANTI-INFLAMMATORY DRUGS (NSAID'S)  Using products such as Advil (ibuprofen), Aleve (naproxen), Motrin (ibuprofen) for additional pain control during fracture healing can delay and/or prevent the healing response.  If you would like to take over the counter (OTC) medication, Tylenol (acetaminophen) is ok.  However, some narcotic medications that are given for pain control contain acetaminophen as well. Therefore, you should not exceed more than 4000 mg of tylenol in a day if you do not have liver disease.  Also note that there are may OTC medicines, such as cold medicines and allergy medicines that my contain tylenol as well.  If you have any questions about medications and/or interactions please ask your doctor/PA or your pharmacist.      ICE AND ELEVATE INJURED/OPERATIVE EXTREMITY  Using ice and elevating the injured extremity above your heart can help with swelling and pain control.  Icing in a pulsatile fashion, such as 20 minutes on and 20  minutes off, can be followed.    Do not place ice directly on skin. Make sure there is a barrier between to skin and the ice pack.    Using frozen items such as frozen peas works well as the conform nicely to the are that  needs to be iced.  USE AN ACE WRAP OR TED HOSE FOR SWELLING CONTROL  In addition to icing and elevation, Ace wraps or TED hose are used to help limit and resolve swelling.  It is recommended to use Ace wraps or TED hose until you are informed to stop.    When using Ace Wraps start the wrapping distally (farthest away from the body) and wrap proximally (closer to the body)   Example: If you had surgery on your leg or thing and you do not have a splint on, start the ace wrap at the toes and work your way up to the thigh        If you had surgery on your upper extremity and do not have a splint on, start the ace wrap at your fingers and work your way up to the upper arm     CALL THE OFFICE WITH ANY QUESTIONS OR CONCERNS: [phone]   VISIT OUR WEBSITE FOR ADDITIONAL INFORMATION: [email or web address]     The extremity can be lifted by the fixator to facilitate wound care and transfers.  Notify the office/Doctor if you experience increasing drainage, redness, or pain from a pin site, or if you notice purulent (thick, snot-like) drainage.  Discharge Wound Care Instructions  Do NOT apply any ointments, solutions or lotions to pin sites or surgical wounds.  These prevent needed drainage and even though solutions like hydrogen peroxide kill bacteria, they also damage cells lining the pin sites that help fight infection.  Applying lotions or ointments can keep the wounds moist and can cause them to breakdown and open up as well. This can increase the risk for infection. When in doubt call the office.  Surgical incisions should be dressed daily.  If any drainage is noted, use one layer of adaptic, then gauze, Kerlix, and an ace wrap.  Once the incision is completely dry and without drainage, it may be left open to air out.  Showering may begin 36-48 hours later.  Cleaning gently with soap and water.  Traumatic wounds should be dressed daily as well.    One layer of adaptic, gauze, Kerlix, then  ace wrap.  The adaptic can be discontinued once the draining has ceased    If you have a wet to dry dressing: wet the gauze with saline the squeeze as much saline out so the gauze is moist (not soaking wet), place moistened gauze over wound, then place a dry gauze over the moist one, followed by Kerlix wrap, then ace wrap.

## 2019-12-06 NOTE — Anesthesia Preprocedure Evaluation (Signed)
Anesthesia Evaluation  Patient identified by MRN, date of birth, ID band Patient awake    Reviewed: Allergy & Precautions, NPO status , Patient's Chart, lab work & pertinent test results  Airway Mallampati: III  TM Distance: >3 FB Neck ROM: Full  Mouth opening: Limited Mouth Opening  Dental no notable dental hx. (+) Edentulous Upper, Edentulous Lower, Dental Advisory Given   Pulmonary asthma , COPD,  COPD inhaler, Current Smoker,    Pulmonary exam normal breath sounds clear to auscultation       Cardiovascular hypertension, +CHF  Normal cardiovascular exam Rhythm:Regular Rate:Normal  - Left ventricle: The cavity size was normal. Wall thickness was increased in a pattern of mild LVH. Systolic function was normal. The estimated ejection fraction was in the range of 60% to 65%. Wall motion was normal; there were no regional wall motion abnormalities. Left ventricular diastolic function parameters were normal. - Mitral valve: There was mild regurgitation. - Systemic veins: IVC is dilated with normal respiratory variation. Estimated right atrial pressure 8 mmHg.   Neuro/Psych PSYCHIATRIC DISORDERS Anxiety Depression Bipolar Disorder    GI/Hepatic GERD  Medicated,(+)     substance abuse  alcohol use,   Endo/Other  negative endocrine ROS  Renal/GU negative Renal ROS  negative genitourinary   Musculoskeletal negative musculoskeletal ROS (+)   Abdominal   Peds negative pediatric ROS (+)  Hematology negative hematology ROS (+)   Anesthesia Other Findings   Reproductive/Obstetrics negative OB ROS                             Anesthesia Physical  Anesthesia Plan  ASA: II  Anesthesia Plan: General   Post-op Pain Management:    Induction: Intravenous  PONV Risk Score and Plan: 1 and Ondansetron and Treatment may vary due to age or medical condition  Airway Management Planned: LMA  Additional  Equipment:   Intra-op Plan:   Post-operative Plan: Extubation in OR  Informed Consent: I have reviewed the patients History and Physical, chart, labs and discussed the procedure including the risks, benefits and alternatives for the proposed anesthesia with the patient or authorized representative who has indicated his/her understanding and acceptance.     Dental advisory given  Plan Discussed with: CRNA  Anesthesia Plan Comments:         Anesthesia Quick Evaluation

## 2019-12-06 NOTE — Transfer of Care (Signed)
Immediate Anesthesia Transfer of Care Note  Patient: [name]  Procedure(s) Performed: HARDWARE REMOVAL RIGHT TIBIAL PLATEAU (Right )  Patient Location: PACU  Anesthesia Type:General  Level of Consciousness: sedated  Airway & Oxygen Therapy: Patient Spontanous Breathing and Patient connected to face mask oxygen  Post-op Assessment: Report given to RN and Post -op Vital signs reviewed and stable  Post vital signs: Reviewed and stable  Last Vitals:  Vitals Value Taken Time  BP 136/78 12/06/19 0858  Temp    Pulse 79 12/06/19 0902  Resp 13 12/06/19 0902  SpO2 99 % 12/06/19 0902  Vitals shown include unvalidated device data.  Last Pain:  Vitals:   12/06/19 0608  PainSc: 7          Complications: No apparent anesthesia complications

## 2019-12-06 NOTE — Anesthesia Postprocedure Evaluation (Signed)
Anesthesia Post Note  Patient: [name]  Procedure(s) Performed: HARDWARE REMOVAL RIGHT TIBIAL PLATEAU (Right )     Patient location during evaluation: PACU Anesthesia Type: General Level of consciousness: awake and alert Pain management: pain level controlled Vital Signs Assessment: post-procedure vital signs reviewed and stable Respiratory status: spontaneous breathing, nonlabored ventilation and respiratory function stable Cardiovascular status: blood pressure returned to baseline and stable Postop Assessment: no apparent nausea or vomiting Anesthetic complications: no    Last Vitals:  Vitals:   12/06/19 0945 12/06/19 1000  BP: 138/87 135/88  Pulse: 84 82  Resp: (!) 22 18  Temp:  (!) 36.3 C  SpO2: 97% 97%    Last Pain:  Vitals:   12/06/19 0930  PainSc: 10-Worst pain ever                 Lowella Curb

## 2019-12-06 NOTE — Op Note (Signed)
Orthopaedic Surgery Operative Note (CSN: [id number] ) Date of Surgery: 12/06/2019  Admit Date: 12/06/2019   Diagnoses: Pre-Op Diagnoses: Right bicondylar tibial plateau fracture Right painful knee with posttraumatic arthritis  Post-Op Diagnosis: Same  Procedures: CPT 20680-Removal of hardware right proximal tibia  Surgeons : Primary: Roby Lofts, MD  Assistant: Ulyses Southward, PA-C  Location: OR 3   Anesthesia:General  Antibiotics: Ancef 2g preop   Tourniquet time:* Missing tourniquet times found for documented tourniquets in log: [id number] *    Estimated Blood Loss:75 mL  Complications:None   Specimens:None   Implants: * No implants in log *   Indications for Surgery: 44 year old male who sustained a bicondylar tibial plateau fracture he underwent open reduction internal fixation in January 2020.  He initially did well and was ambulating however he did develop some posttraumatic arthritis and continued pain.  Due to the continued pain that he was having that failed conservative management with injections and medications I recommended removal of hardware with planning for staged total knee arthroplasty.  Risks and benefits were discussed with the patient.  He agreed to proceed with surgery and consent was obtained from his guardianship.  Operative Findings: Successful removal of right proximal tibial hardware without any complications.  Procedure: The patient was identified in the preoperative holding area. Consent was confirmed with the patient and their family and all questions were answered. The operative extremity was marked after confirmation with the patient. he was then brought back to the operating room by our anesthesia colleagues.  He was placed under general anesthetic and carefully transferred over to a radiolucent flat top table.  A bump was placed under his operative hip a nonsterile tourniquet was placed to his upper thigh.  The right lower extremity was prepped  and draped in usual sterile fashion.  A timeout was performed to verify the patient, the procedure and the extremity.  Preoperative antibiotics were dosed.  Fluoroscopic imaging was obtained to show the hardware that was in place.  The tourniquet was inflated to 300 mmHg.  I carried incision through the previous anterior lateral parapatellar incision.  I performed subperiosteal dissection along the plate and exposed the proximal screws and were able to remove these without difficulty.  Through the percutaneous incisions along the lateral leg I was able to remove the percutaneous 3.5 millimeter screws from the tibial shaft.  A Cobb elevator was then used to elevate the plate off.  I was able to release the scar tissue and remove the plate without difficulty.  The 3.5 millimeter screw from lateral to medial underneath the plate was removed as well.  I then turned my attention to the medial side.  Through the previous medial incision I carried this down through skin and subcutaneous tissue.  Subperiosteal dissection was performed to expose the plate.  All of the nonlocking and locking screws were removed and then a Cobb elevator was used to elevate the plate off the bone.  The plate was removed without difficulty.  A percutaneous incision was made anteriorly to remove the 3.5 millimeter screws going from anterior to posterior in the medial condyle.  Fluoroscopic imaging was used to confirm removal of the hardware.  The incisions were copiously irrigated.  A gram of vancomycin powder was placed between the 2 incisions.  Layer closure consisting of 0 Vicryl, 2-0 Vicryl and 3-0 Monocryl and Steri-Strips was used.  The tourniquet was deflated.  Sterile dressing was placed.  The patient was then awoken from anesthesia and taken the  PACU in stable condition.  Post Op Plan/Instructions: Patient will be weightbearing as tolerated to the right lower extremity.  He will be discharged from the recovery room.  No DVT  prophylaxis is needed in this ambulatory patient.  I was present and performed the entire surgery.  Patrecia Pace, PA-C did assist me throughout the case. An assistant was necessary given the difficulty in approach, maintenance of reduction and ability to instrument the fracture.   Katha Hamming, MD [facility]

## 2020-03-24 IMAGING — CR DG CHEST 1V PORT
1 series · 2 of 2 positions shown · non-contrast
Comparison: Chest radiograph dated 05/06/2017

CLINICAL DATA: 41-year-old male with shortness of breath and cough.

EXAM:
PORTABLE CHEST 1 VIEW

[Series 1: portable · 0.17mm/px · 2 of 2 slices shown]
[im 1/2]
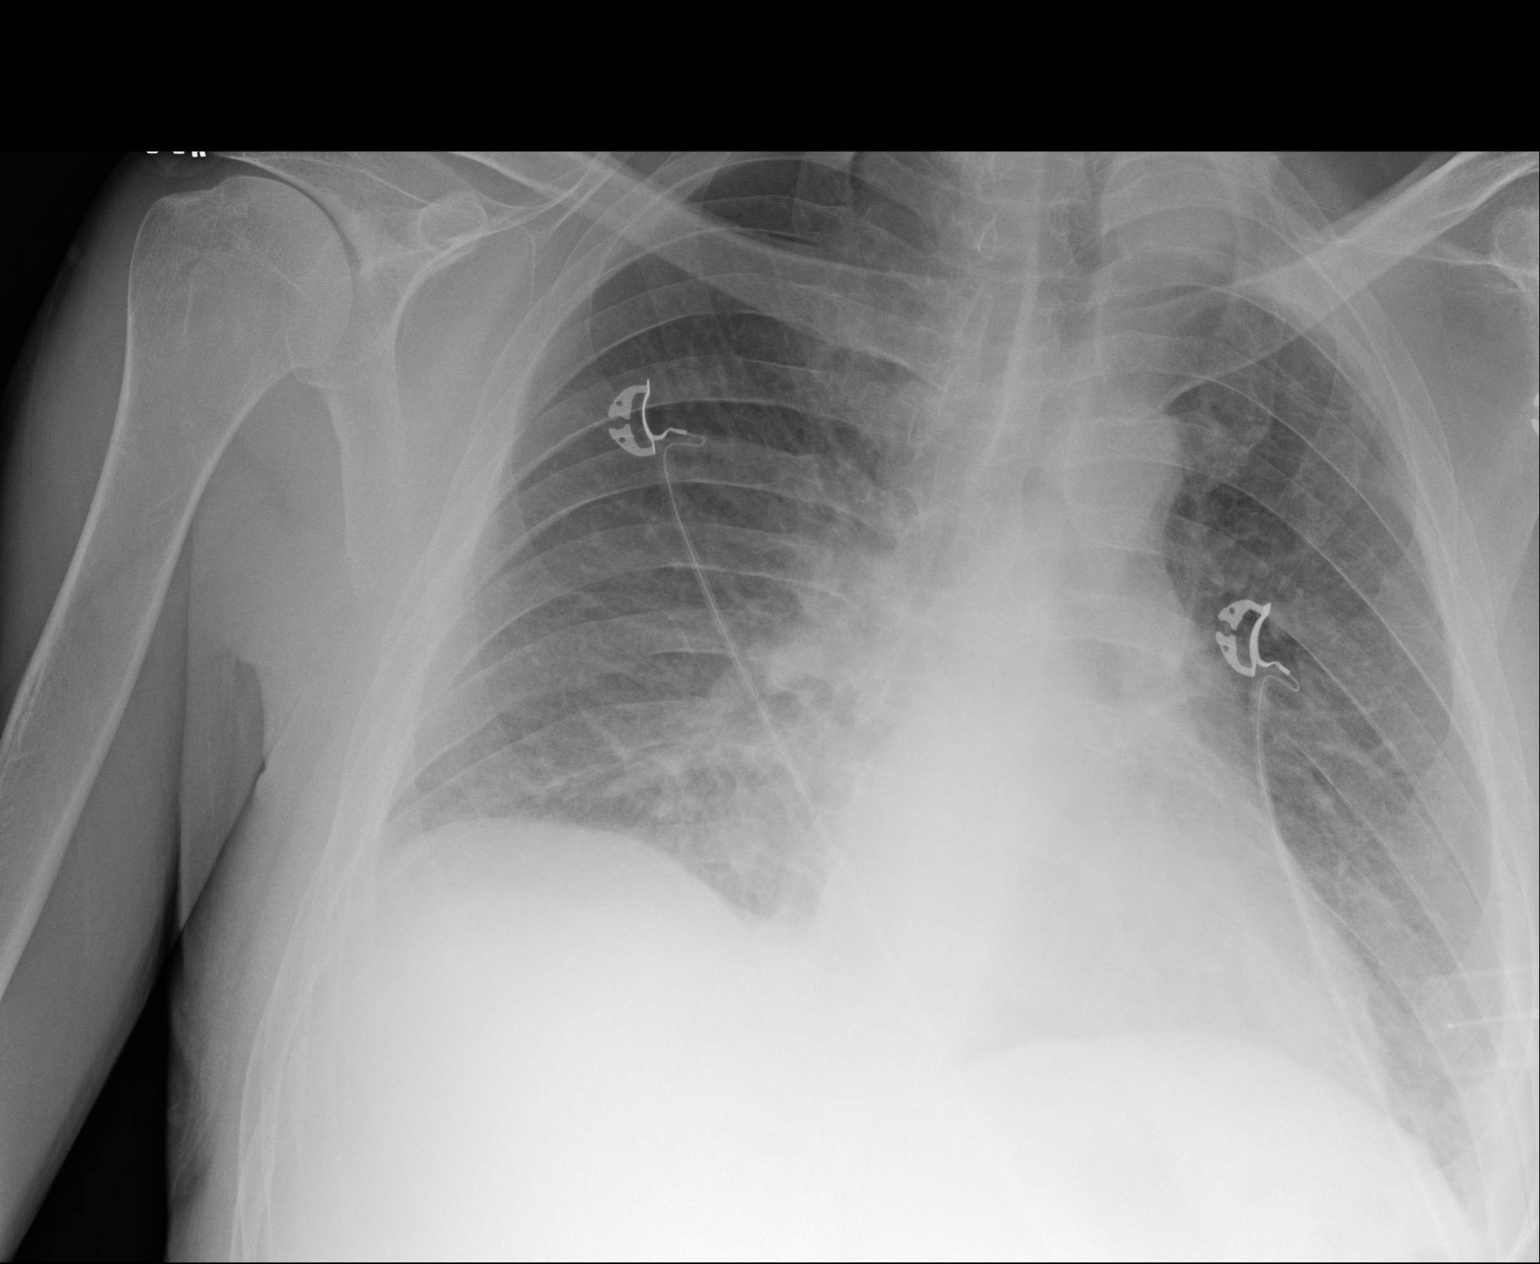
[im 2/2]
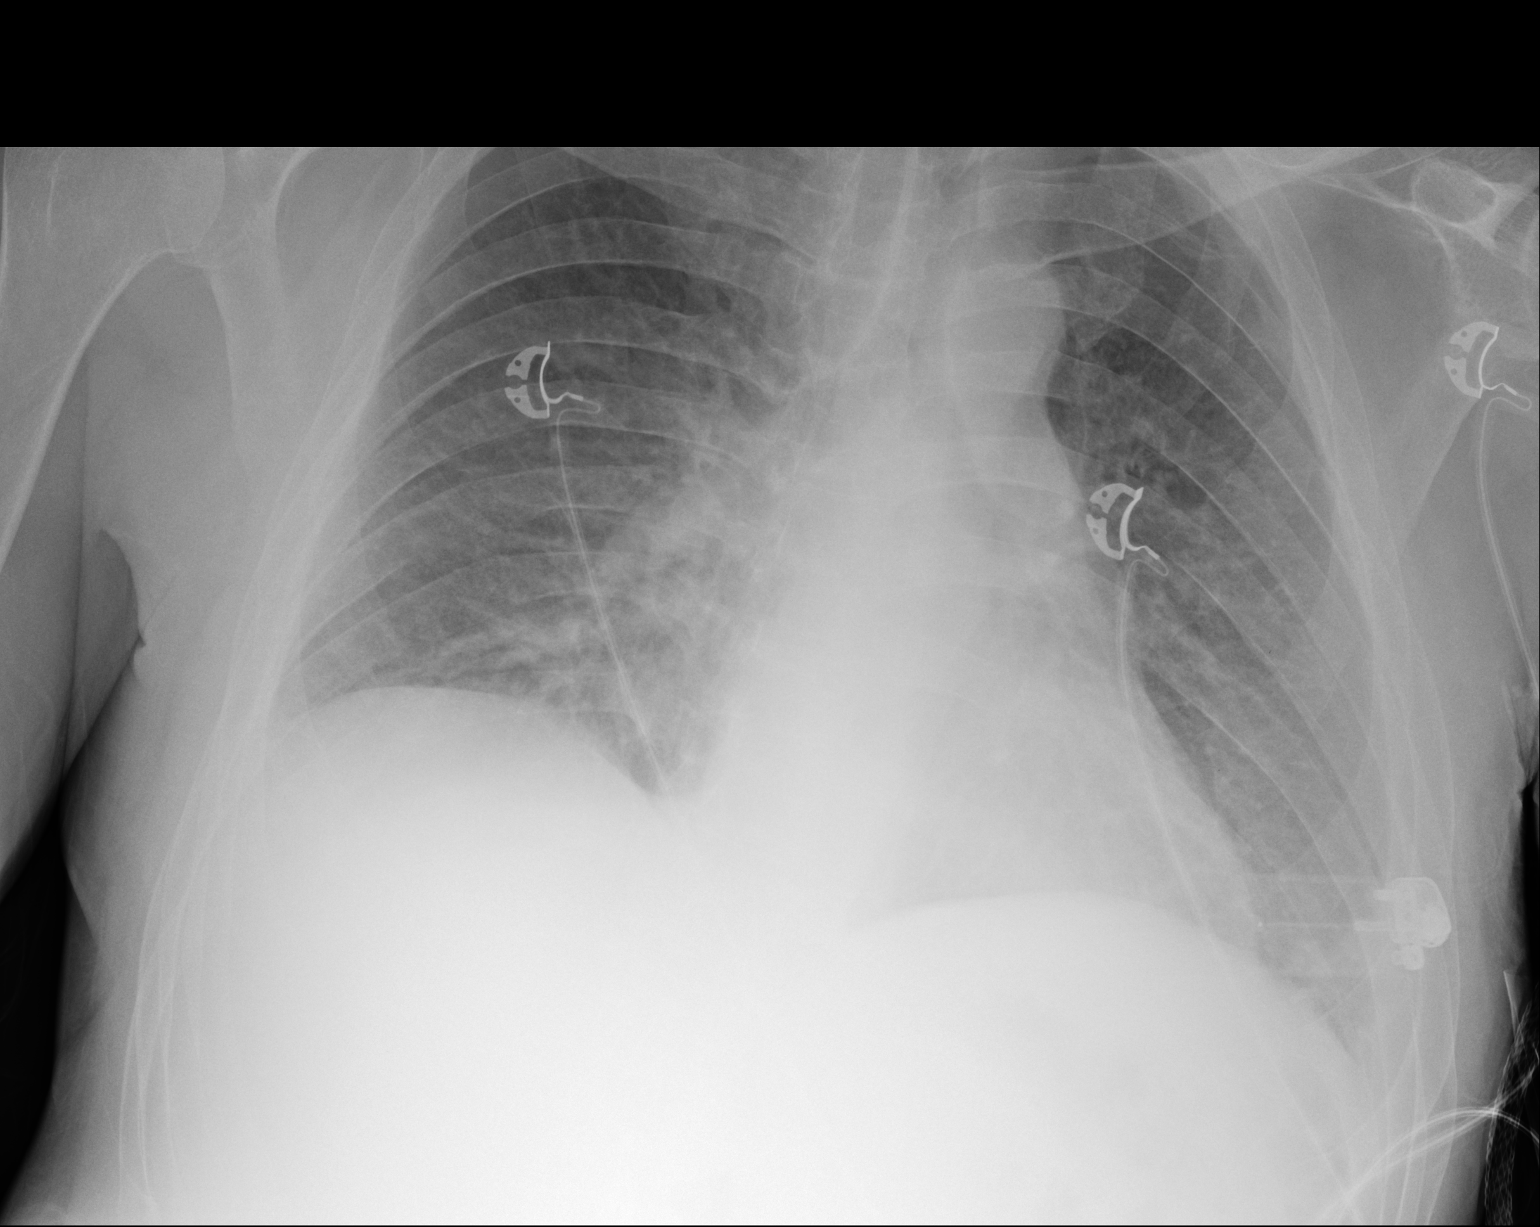

[2 of 2 positions shown; findings below may reference images not displayed]

FINDINGS: There is mild diffuse interstitial prominence similar to prior
radiograph. No focal consolidation, pleural effusion, or
pneumothorax. The cardiac silhouette is within normal limits. No
acute osseous pathology.
IMPRESSION: Probable mild congestion. Pneumonia is not excluded. Clinical
correlation is recommended.

## 2020-04-11 IMAGING — DX DG ANKLE COMPLETE 3+V*L*
3 series · 3 of 3 positions shown · non-contrast
Comparison: 05/29/2012

CLINICAL DATA: Pain and swelling for several hours, no known
injury, initial encounter

EXAM:
LEFT ANKLE COMPLETE - 3+ VIEW

[ankle ap]
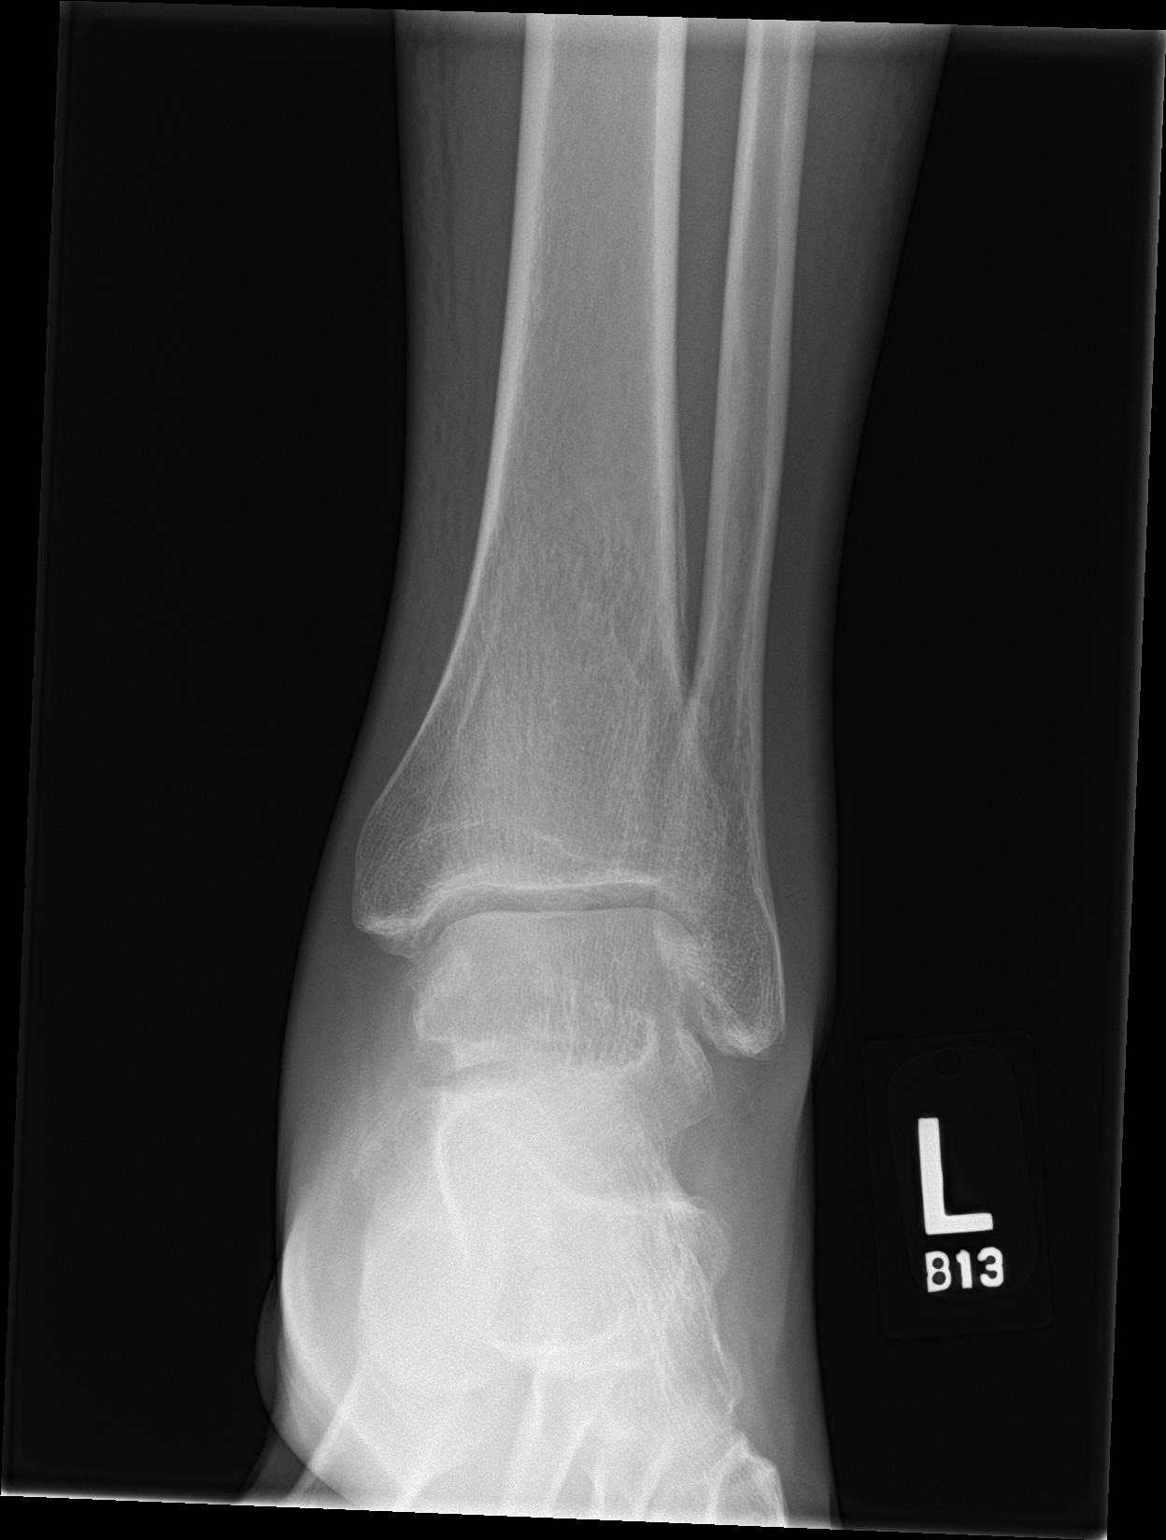

[ankle obl]
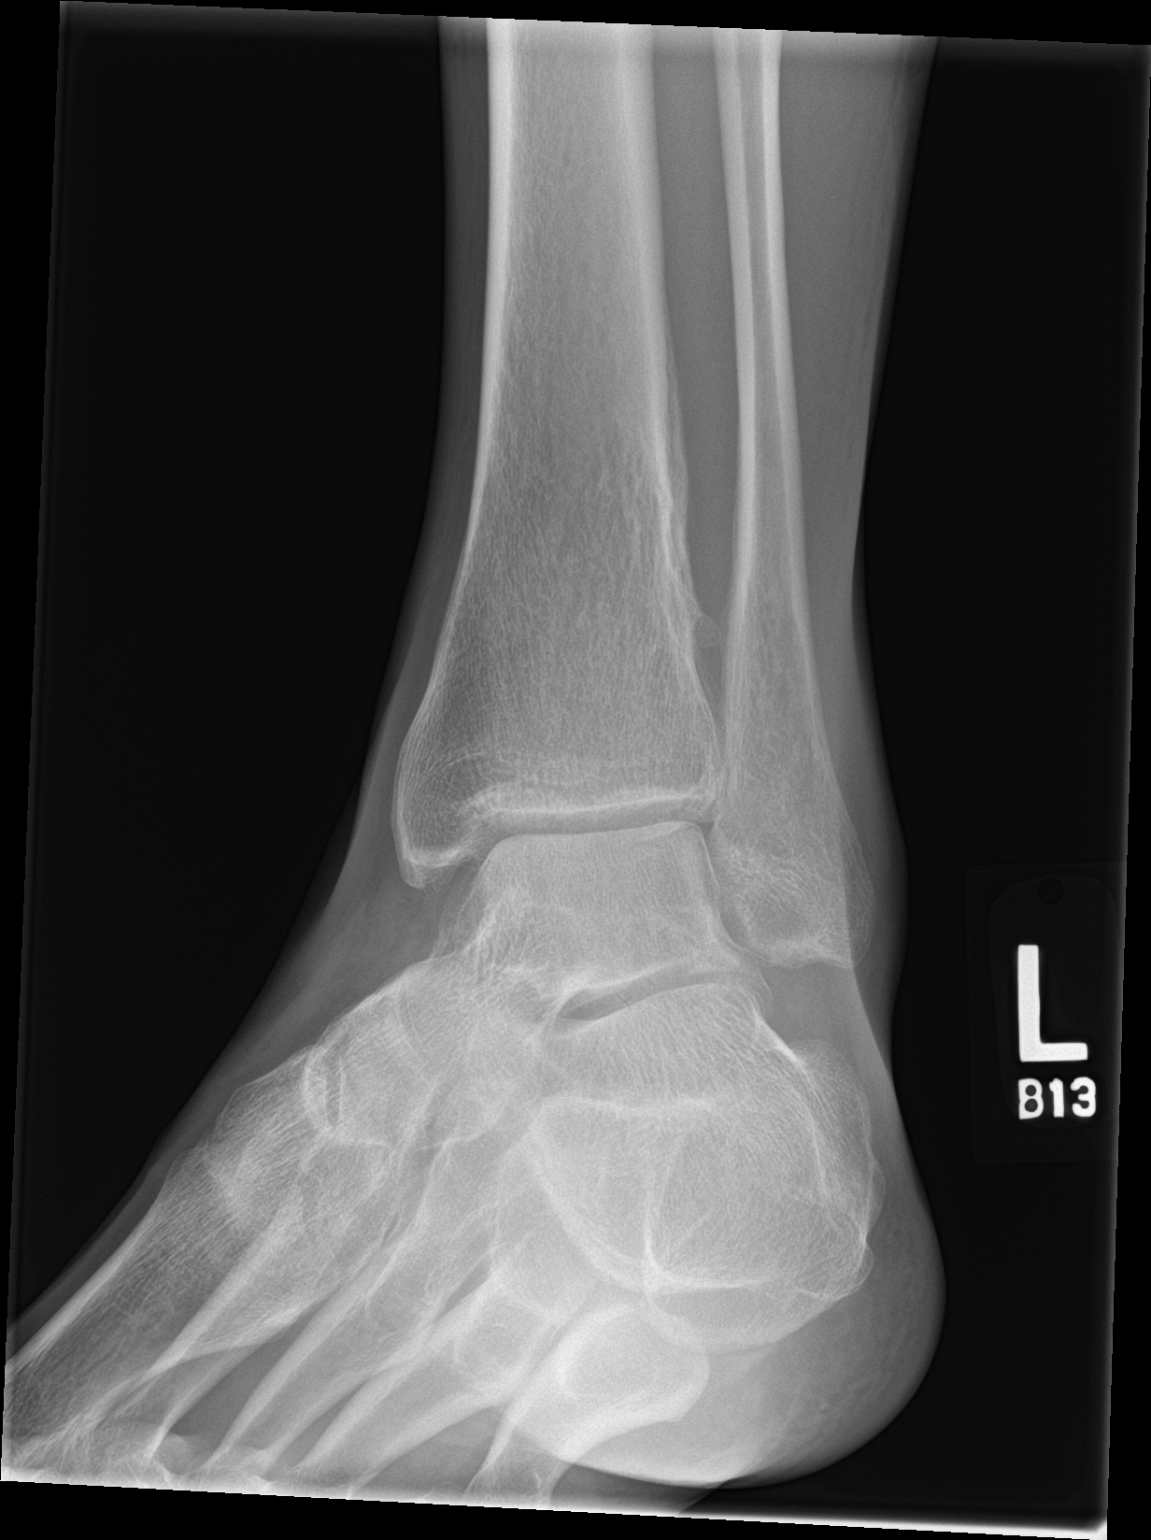

[ankle lat]
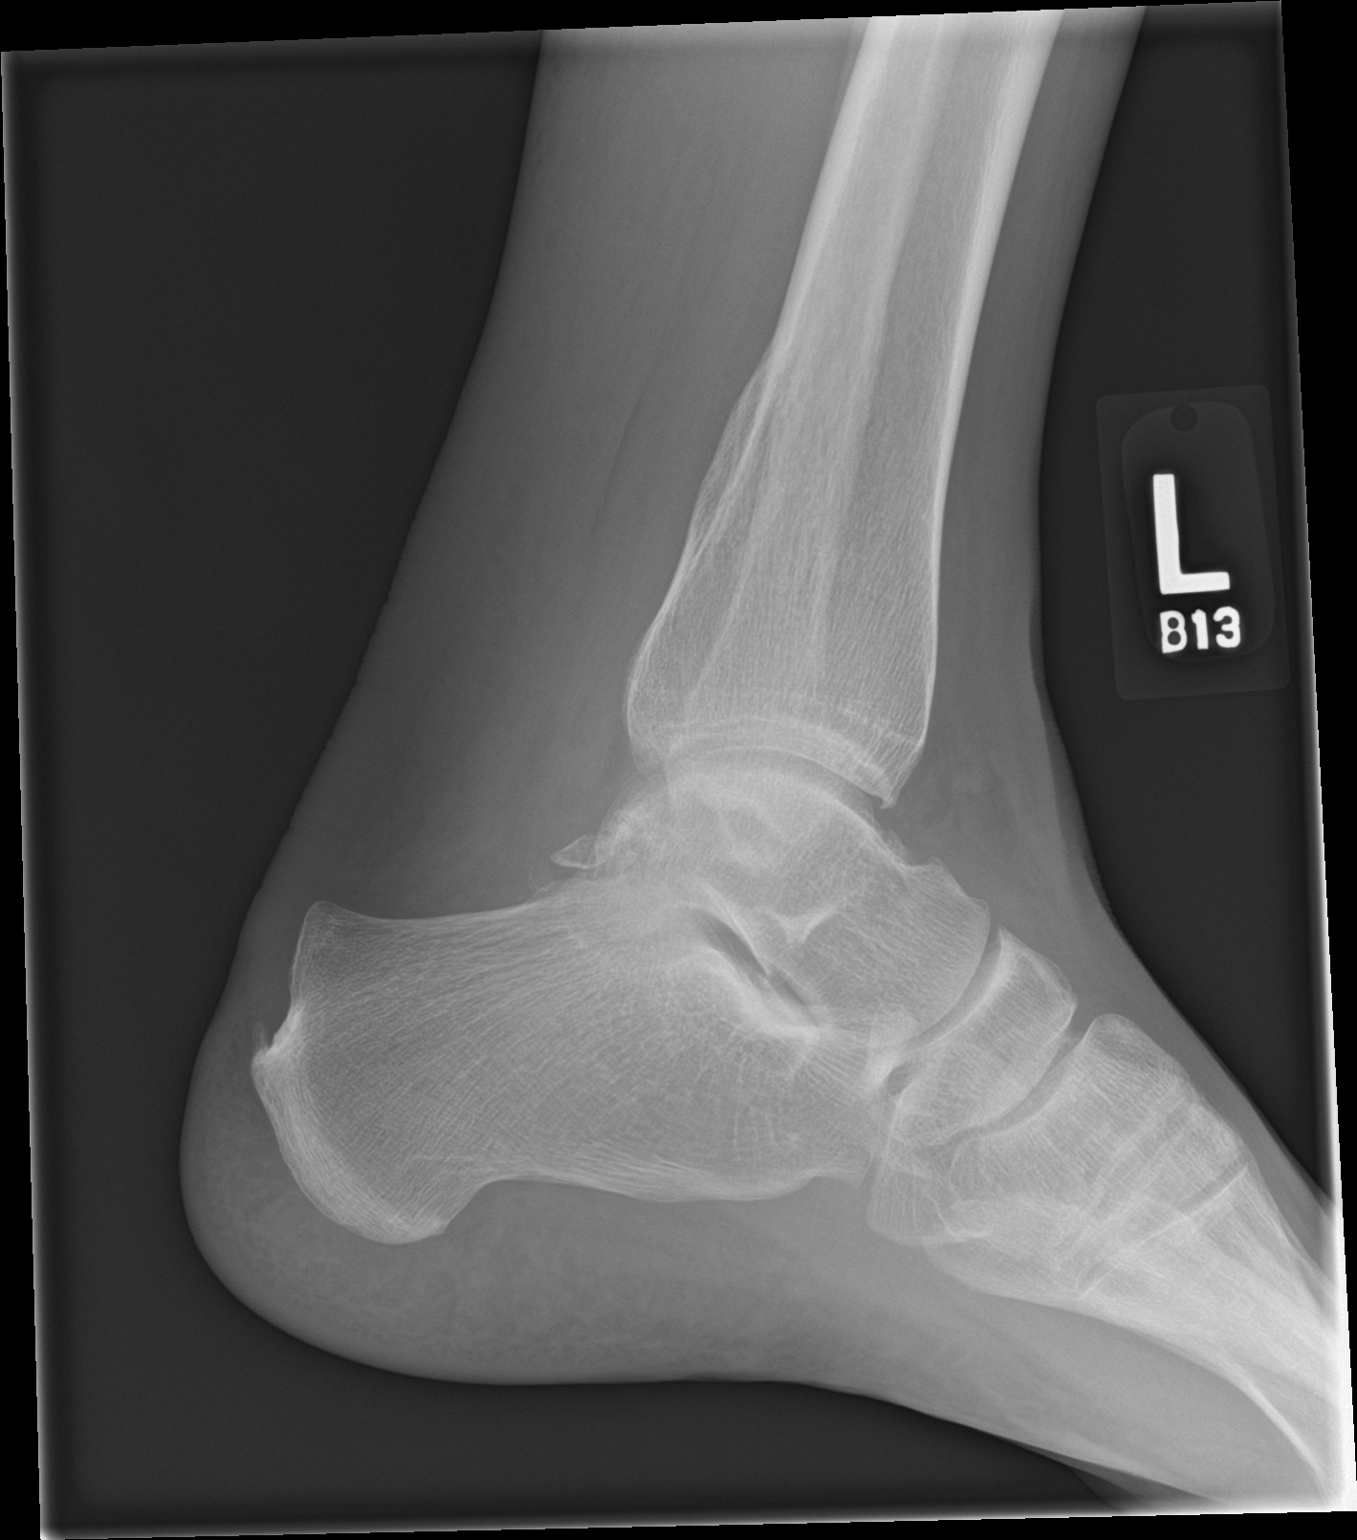

[3 of 3 positions shown; findings below may reference images not displayed]

FINDINGS: No acute fracture is identified. Healed fracture in the distal tibia
is seen. Mild soft tissue swelling is noted.
IMPRESSION: Mild soft tissue swelling without acute bony abnormality.

## 2020-07-29 ENCOUNTER — Emergency Department: Payer: Medicare Other

## 2020-07-29 ENCOUNTER — Encounter: Payer: Self-pay | Admitting: Emergency Medicine

## 2020-07-29 ENCOUNTER — Emergency Department
Admission: EM | Admit: 2020-07-29 | Discharge: 2020-07-29 | Disposition: A | Discharge status: Skilled Nursing Facility | Payer: Medicare Other | Source: Home / Self Care

## 2020-07-29 ENCOUNTER — Other Ambulatory Visit: Payer: Self-pay

## 2020-07-29 ENCOUNTER — Emergency Department
Admission: EM | Admit: 2020-07-29 | Discharge: 2020-07-29 | Disposition: A | Discharge status: Skilled Nursing Facility | Payer: Medicare Other | Attending: Emergency Medicine | Admitting: Emergency Medicine

## 2020-07-29 DIAGNOSIS — I110 Hypertensive heart disease with heart failure: Secondary | ICD-10-CM | POA: Insufficient documentation

## 2020-07-29 DIAGNOSIS — J45909 Unspecified asthma, uncomplicated: Secondary | ICD-10-CM | POA: Insufficient documentation

## 2020-07-29 DIAGNOSIS — Z79899 Other long term (current) drug therapy: Secondary | ICD-10-CM | POA: Insufficient documentation

## 2020-07-29 DIAGNOSIS — F17200 Nicotine dependence, unspecified, uncomplicated: Secondary | ICD-10-CM | POA: Insufficient documentation

## 2020-07-29 DIAGNOSIS — S3993XA Unspecified injury of pelvis, initial encounter: Secondary | ICD-10-CM | POA: Diagnosis present

## 2020-07-29 DIAGNOSIS — R52 Pain, unspecified: Secondary | ICD-10-CM

## 2020-07-29 DIAGNOSIS — Y999 Unspecified external cause status: Secondary | ICD-10-CM | POA: Insufficient documentation

## 2020-07-29 DIAGNOSIS — M79651 Pain in right thigh: Secondary | ICD-10-CM | POA: Insufficient documentation

## 2020-07-29 DIAGNOSIS — I509 Heart failure, unspecified: Secondary | ICD-10-CM | POA: Insufficient documentation

## 2020-07-29 DIAGNOSIS — Y939 Activity, unspecified: Secondary | ICD-10-CM | POA: Insufficient documentation

## 2020-07-29 DIAGNOSIS — W1839XD Other fall on same level, subsequent encounter: Secondary | ICD-10-CM | POA: Insufficient documentation

## 2020-07-29 DIAGNOSIS — S329XXD Fracture of unspecified parts of lumbosacral spine and pelvis, subsequent encounter for fracture with routine healing: Secondary | ICD-10-CM | POA: Insufficient documentation

## 2020-07-29 DIAGNOSIS — S3282XA Multiple fractures of pelvis without disruption of pelvic ring, initial encounter for closed fracture: Secondary | ICD-10-CM

## 2020-07-29 DIAGNOSIS — W010XXA Fall on same level from slipping, tripping and stumbling without subsequent striking against object, initial encounter: Secondary | ICD-10-CM | POA: Insufficient documentation

## 2020-07-29 DIAGNOSIS — J449 Chronic obstructive pulmonary disease, unspecified: Secondary | ICD-10-CM | POA: Insufficient documentation

## 2020-07-29 DIAGNOSIS — F17210 Nicotine dependence, cigarettes, uncomplicated: Secondary | ICD-10-CM | POA: Insufficient documentation

## 2020-07-29 DIAGNOSIS — W19XXXA Unspecified fall, initial encounter: Secondary | ICD-10-CM

## 2020-07-29 DIAGNOSIS — Y929 Unspecified place or not applicable: Secondary | ICD-10-CM | POA: Insufficient documentation

## 2020-07-29 MED ORDER — OXYCODONE HCL 10 MG PO TABS: 10.0000 mg | ORAL_TABLET | ORAL | 0 refills | Status: AC | PRN

## 2020-07-29 MED ORDER — ACETAMINOPHEN 500 MG PO TABS: 1000.0000 mg | ORAL_TABLET | Freq: Three times a day (TID) | ORAL | 0 refills | Status: AC | PRN

## 2020-07-29 MED ORDER — ONDANSETRON HCL 4 MG/2ML IJ SOLN
4.0000 mg | Freq: Once | INTRAMUSCULAR | Status: AC
  Administered 2020-07-29: 4 mg via INTRAVENOUS
  Filled 2020-07-29: qty 2

## 2020-07-29 MED ORDER — OXYCODONE HCL 5 MG PO TABS
10.0000 mg | ORAL_TABLET | Freq: Once | ORAL | Status: AC
  Administered 2020-07-29: 10 mg via ORAL
  Filled 2020-07-29: qty 2

## 2020-07-29 MED ORDER — FENTANYL CITRATE (PF) 100 MCG/2ML IJ SOLN
50.0000 ug | Freq: Once | INTRAMUSCULAR | Status: AC
  Administered 2020-07-29: 50 ug via INTRAVENOUS
  Filled 2020-07-29: qty 2

## 2020-07-29 MED ORDER — HYDROMORPHONE HCL 2 MG PO TABS
2.0000 mg | ORAL_TABLET | Freq: Once | ORAL | Status: AC
  Administered 2020-07-29: 2 mg via ORAL
  Filled 2020-07-29: qty 1

## 2020-07-29 MED ORDER — IOHEXOL 300 MG/ML  SOLN
100.0000 mL | Freq: Once | INTRAMUSCULAR | Status: AC | PRN
  Administered 2020-07-29: 100 mL via INTRAVENOUS

## 2020-07-29 MED ORDER — HYDROMORPHONE HCL 2 MG PO TABS: 2.0000 mg | ORAL_TABLET | Freq: Two times a day (BID) | ORAL | 0 refills | Status: AC | PRN

## 2020-07-29 MED ORDER — ACETAMINOPHEN 500 MG PO TABS
1000.0000 mg | ORAL_TABLET | Freq: Once | ORAL | Status: AC
  Administered 2020-07-29: 1000 mg via ORAL
  Filled 2020-07-29: qty 2

## 2020-07-29 NOTE — ED Notes (Signed)
[facility] called for transport; report called to [facility]

## 2020-07-29 NOTE — Discharge Instructions (Addendum)
Pain control: tylenol 1000mg  every 8 hours. Oxycodone 10mg  every 4 hours for breakthrough pain. Patient will need in house physical therapy. Follow up with Dr. in 1 week. Return to the ER for uncontrolled pain.

## 2020-07-29 NOTE — ED Triage Notes (Signed)
First Nurse Note:  Returns from [facility].  Patient discharged from ED with right hip fx.  Returns for pain management.

## 2020-07-29 NOTE — Discharge Instructions (Addendum)
Use the Dilaudid 1 pill twice a day for pain.  Go ahead and do the physical therapy that will actually help with the  fracture heal faster and hurt less in the long run.  Try to get around using a walker and not putting weight on the right leg.  Return for worse pain or any other problems.

## 2020-07-29 NOTE — ED Triage Notes (Signed)
Pt to triage via recliner with no distress noted; EMS brought pt in from [facility]; st went to help somebody up and they fell together; st fell on his rt hip and having pain since; pt also reports pain to rt lower leg with hx ORIF tibia plateau

## 2020-07-29 NOTE — ED Notes (Signed)
D/c instructions, f/u and rx reviewed with pt--pt voices good understanding; pt left prior to instructions being signed

## 2020-07-29 NOTE — ED Provider Notes (Signed)
[facility] Emergency Department Provider Note   ____________________________________________   None    (approximate)  I have reviewed the triage vital signs and the nursing notes.   HISTORY  Chief Complaint No chief complaint on file. Chief complaint is hip pain  HPI Irven CoeWyman Medal is a 44 y.o. male he was seen yesterday and found to have a pubic ramus fracture.  Patient returns today because he wants Dilaudid for pain.  He says he cannot get up and move around without something more for pain.  He has not taken morphine because he has an allergy to it.  I reviewed his chart from yesterday.  He does have a pubic ramus fracture with some hematoma formation and even hematoma in the piriformis.  I discussed with him the need to try to move around using a walker and keeping his weight off of the affected leg.  I will give him Dilaudid p.o. for 5 days.  Hopefully that will work.  He will return if he is worse.         Past Medical History:  Diagnosis Date  . Anxiety   . Arthritis    knees and fingers  . Asthma   . CHF (congestive heart failure) (HCC)   . COPD (chronic obstructive pulmonary disease) (HCC)   . Depression   . Encephalopathy   . GERD (gastroesophageal reflux disease)   . Hypertension   . Hyponatremia 06/2016  . Pneumonia 2017  . Renal disorder    kidney injury  . Seizures [facility](HCC)     Patient Active Problem List   Diagnosis Date Noted  . Closed fracture of right proximal tibia 11/05/2019  . Closed displaced bicondylar fracture of right tibia 12/03/2018  . Cellulitis of left lower extremity   . Cellulitis of left foot 03/07/2018  . COPD (chronic obstructive pulmonary disease) (HCC) 05/03/2017  . Hyponatremia 06/27/2016  . GERD (gastroesophageal reflux disease) 11/11/2015  . Borderline personality disorder (HCC) 11/11/2015  . Alcohol use disorder, severe, in controlled environment (HCC) 11/11/2015  . MDD (major depressive disorder),  recurrent severe, 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  . 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 Tobacco abuse 11/05/2013    Past Surgical History:  Procedure Laterality Date  . COLONOSCOPY    . ESOPHAGOGASTRODUODENOSCOPY    . EXTERNAL FIXATION LEG Right 12/01/2018   Procedure: EXTERNAL FIXATION LEG;  Surgeon: Juanell FairlyKrasinski, Kevin, MD;  Location: [facility] ORS;  Service: Orthopedics;  Laterality: Right;  . EXTERNAL FIXATION REMOVAL Right 12/06/2018   Procedure: REMOVAL EXTERNAL FIXATION LEG;  Surgeon: Roby LoftsHaddix, Kevin P, MD;  Location: [facility] OR;  Service: Orthopedics;  Laterality: Right;  . FINGER SURGERY Left    5th digit-Fracture-"worse now than before"  . HARDWARE REMOVAL Right 12/06/2019   Procedure: HARDWARE REMOVAL RIGHT TIBIAL PLATEAU;  Surgeon: Roby LoftsHaddix, Kevin P, MD;  Location: [facility] OR;  Service: Orthopedics;  Laterality: Right;  . ORIF TIBIA PLATEAU Right 12/06/2018   Procedure: OPEN REDUCTION INTERNAL FIXATION (ORIF) TIBIAL PLATEAU;  Surgeon: Roby LoftsHaddix, Kevin P, MD;  Location: [facility] OR;  Service: Orthopedics;  Laterality: Right;  . PLEURAL EFFUSION DRAINAGE Right 01/02/2016   Procedure: DRAINAGE OF PLEURAL EFFUSION;  Surgeon: Loreli SlotSteven C Hendrickson, MD;  [facility];  Service: Thoracic;  Laterality: Right;  Marland Kitchen. VIDEO ASSISTED THORACOSCOPY (VATS)/DECORTICATION Right 01/02/2016   Procedure: VIDEO ASSISTED THORACOSCOPY (VATS)/DECORTICATION;  Surgeon: Loreli SlotSteven C Hendrickson, MD;  [facility];  Service: Thoracic;  Laterality: Right;  Marland Kitchen. VIDEO BRONCHOSCOPY N/A 01/02/2016  Procedure: VIDEO BRONCHOSCOPY;  Surgeon: Loreli Slot, MD;  Location: [facility] OR;  Service: Thoracic;  Laterality: N/A;    Prior to Admission medications   Medication Sig Start Date End Date Taking? Authorizing Provider  acetaminophen (TYLENOL) 500 MG tablet Take 2 tablets (1,000 mg total) by mouth every 8 (eight) hours as needed for mild pain, moderate pain, fever or headache. 07/29/20 07/29/21  Nita Sickle, MD  busPIRone  (BUSPAR) 5 MG tablet Take 5 mg by mouth 2 (two) times daily.    [provider]  Cholecalciferol (VITAMIN D3) 2000 units TABS Take 2,000 Units by mouth daily.     [provider]  cyclobenzaprine (FLEXERIL) 10 MG tablet Take 10 mg by mouth 3 (three) times daily.    [provider]  DEXTROMETHORPHAN HBR PO Take 1 capsule by mouth every 3 (three) hours as needed (cough).    [provider]  diclofenac Sodium (VOLTAREN) 1 % GEL Apply 2 g topically See admin instructions. Apply 2 g transdermally every shift for pain to right knee    [provider]  divalproex (DEPAKOTE ER) 500 MG 24 hr tablet Take 4 tablets (2,000 mg total) by mouth at bedtime. 11/13/15   Jimmy Footman, MD  enalapril (VASOTEC) 10 MG tablet Take 10 mg by mouth daily.    [provider]  escitalopram (LEXAPRO) 20 MG tablet Take 20 mg by mouth daily.     [provider]  esomeprazole (NEXIUM) 40 MG capsule Take 40 mg by mouth daily.     [provider]  ferrous sulfate 325 (65 FE) MG tablet Take 1 tablet (325 mg total) by mouth 2 (two) times daily with a meal. 01/11/16   Regalado, Belkys A, MD  furosemide (LASIX) 20 MG tablet Take 1 tablet (20 mg total) by mouth daily. 03/08/18   Vassie Loll, MD  gabapentin (NEURONTIN) 100 MG capsule Take 100 mg by mouth 3 (three) times daily.     [provider]  guaiFENesin (MUCINEX) 600 MG 12 hr tablet Take 600 mg by mouth 2 (two) times daily.    [provider]  hydrocortisone cream 1 % Apply 1 application topically every 8 (eight) hours as needed for itching.    [provider]  HYDROmorphone (DILAUDID) 2 MG tablet Take 1 tablet (2 mg total) by mouth every 12 (twelve) hours as needed for up to 5 days for severe pain. 07/29/20 08/03/20  Arnaldo Natal, MD  Ipratropium-Albuterol (COMBIVENT RESPIMAT) 20-100 MCG/ACT AERS respimat Inhale 2 puffs into the lungs every 6 (six) hours as needed for  wheezing or shortness of breath.    [provider]  lactulose (CHRONULAC) 10 GM/15ML solution Take 45 mLs (30 g total) by mouth 2 (two) times daily. Patient taking differently: Take 30 g by mouth every 12 (twelve) hours as needed (Encephalopathy).  05/07/17   Erick Blinks, MD  loperamide (IMODIUM A-D) 2 MG tablet Take 2 mg by mouth every 4 (four) hours as needed for diarrhea or loose stools.    [provider]  loratadine (CLARITIN) 10 MG tablet Take 10 mg by mouth daily.    [provider]  Melatonin 5 MG TABS Take 10 mg by mouth at bedtime.    [provider]  Multiple Vitamins-Minerals (THERA-M PO) Take 1 tablet by mouth daily.    [provider]  oxyCODONE 10 MG TABS Take 1 tablet (10 mg total) by mouth every 4 (four) hours as needed for moderate pain or  severe pain. 07/29/20 07/29/21  Nita Sickle, MD  Oxycodone HCl 10 MG TABS Take 1 tablet (10 mg total) by mouth every 6 (six) hours as needed. 12/06/19   Despina Hidden, PA-C  primidone (MYSOLINE) 250 MG tablet Take 250 mg by mouth 3 (three) times daily. Take with 50 mg to equal 300 mg 3 times daily    [provider]  primidone (MYSOLINE) 50 MG tablet Take 50 mg by mouth 3 (three) times daily. Take with 250 mg to equal 300 mg 3 times daily    [provider]  senna (SENOKOT) 8.6 MG TABS tablet Take 1 tablet by mouth at bedtime. Hold for loose stools    [provider]  umeclidinium-vilanterol (ANORO ELLIPTA) 62.5-25 MCG/INH AEPB Inhale 1 puff into the lungs daily.    [provider]  zolpidem (AMBIEN) 10 MG tablet Take 10 mg by mouth at bedtime.    [provider]    Allergies Morphine and related and Penicillins  Family History  Problem Relation Age of Onset  . CAD Mother   . Diabetes Mellitus II Father   . Prostate cancer Paternal Grandfather     Social History Social History   Tobacco Use  . Smoking status: Current Every Day Smoker     Packs/day: 0.50    Years: 10.00    Pack years: 5.00    Types: Pipe  . 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 Vaping Use: Never used  Substance Use Topics  . Alcohol use: No    Comment: pt states he no longer drinks 2013  . Drug use: No    Review of Systems  Constitutional: No fever/chills Eyes: No visual changes. ENT: No sore throat. Cardiovascular: Denies chest pain. Respiratory: Denies shortness of breath. Gastrointestinal: No abdominal pain.  No nausea, no vomiting.  No diarrhea.  No constipation. Genitourinary: Negative for dysuria. Musculoskeletal: Negative for back pain.  Patient does have a lot of pain in the pelvis and hip. Skin: Negative for rash. Neurological: Negative for headaches, focal weakness   ____________________________________________   PHYSICAL EXAM:  VITAL SIGNS: ED Triage Vitals  Enc Vitals Group     BP      Pulse      Resp      Temp      Temp src      SpO2      Weight      Height      Head Circumference      Peak Flow      Pain Score      Pain Loc      Pain Edu?      Excl. in GC?     Constitutional: Alert and oriented. Well appearing and in no acute distress as long as he is laying still. Eyes: Conjunctivae are normal.  Head: Atraumatic. Nose: No congestion/rhinnorhea. Mouth/Throat: Mucous membranes are moist.   Neck: No stridor.   Cardiovascular: Peri Jefferson peripheral circulation. Respiratory: Normal respiratory effort.  No retractions.  Gastrointestinal: Soft and nontender. No distention. No abdominal bruits. Musculoskeletal: Pain in the area of the right hip and right-sided pelvis Neurologic:  Normal speech and language. No gross focal neurologic deficits are appreciated. Skin:  Skin is warm, dry and intact. No rash noted.   ____________________________________________   LABS (all labs ordered are listed, but only abnormal results are displayed)  Labs Reviewed - No data to  display ____________________________________________  EKG  ____________________________________________  RADIOLOGY  ED MD interpretation: This is yesterday's report I reviewed the report but not the films  Official radiology report(s): DG Pelvis 1-2 Views  Result Date: 07/29/2020 CLINICAL DATA:  Initial evaluation for acute trauma, fall. EXAM: PELVIS - 1-2 VIEW COMPARISON:  Prior study from 06/01/2007. FINDINGS: Cortical irregularity seen extending through the right superior and inferior pubic rami, consistent with acute minimally displaced fractures. No associated pubic diastasis. SI joints remain approximated. Remainder of the visualized bony pelvis intact. No other acute osseous abnormality about the hips. Underlying osteopenia.  No visible soft tissue injury. IMPRESSION: Acute mildly displaced fractures extending through the right superior and inferior pubic rami. Electronically Signed   By: Rise Mu M.D.   On: 07/29/2020 01:30   CT PELVIS W CONTRAST  Result Date: 07/29/2020 CLINICAL DATA:  Pelvic fractures EXAM: CT PELVIS WITH CONTRAST TECHNIQUE: Multidetector CT imaging of the pelvis was performed using the standard protocol following the bolus administration of intravenous contrast. CONTRAST:  OMNIPAQUE IOHEXOL 300 MG/ML  SOLN COMPARISON:  Plain films earlier today FINDINGS: Urinary Tract:  No abnormality visualized. Bowel:  Unremarkable visualized pelvic bowel loops. Vascular/Lymphatic: Aorta and iliac vessels normal caliber. No adenopathy. Reproductive:  No visible focal abnormality. Other:  No free fluid or free air. Musculoskeletal: Fractures seen through the right superior and inferior pubic rami as seen by plain film. No proximal femoral abnormality. There is hematoma seen within the right extraperitoneal soft tissues adjacent to the superior pubic ramus fracture. Mild enlargement of the adjacent adductor muscles and piriformis muscle, likely mild intramuscular  hematoma. No active extravasation of contrast seen. IMPRESSION: Fractures through the right superior and inferior pubic ramus with adjacent extraperitoneal hematoma and probable piriformis/adductor muscle hematoma. No active extravasation of contrast. Electronically Signed   By: Charlett Nose M.D.   On: 07/29/2020 03:13   DG FEMUR, MIN 2 VIEWS RIGHT  Result Date: 07/29/2020 CLINICAL DATA:  Initial evaluation for acute trauma, fall. EXAM: RIGHT FEMUR 2 VIEWS COMPARISON:  None. FINDINGS: Acute mildly displaced fractures seen extending through the right superior and inferior pubic rami/pubis symphysis. Remainder the visualized bony pelvis intact. No other acute fracture dislocation about the right femur. Advanced for age degenerative changes noted at the visualized right knee. Sequelae of prior ORIF with hardware removal noted at the distal femoral shaft. Advanced osteopenia for age. No soft tissue abnormality. IMPRESSION: 1. Acute mildly displaced fractures extending through the right superior and inferior pubic rami/pubis symphysis. 2. No other acute fracture or dislocation about the right femur. Electronically Signed   By: Rise Mu M.D.   On: 07/29/2020 01:33    ____________________________________________   PROCEDURES  Procedure(s) performed (including Critical Care):  Procedures   ____________________________________________   INITIAL IMPRESSION / ASSESSMENT AND PLAN / ED COURSE  Please see HPI for discussion              ____________________________________________   FINAL CLINICAL IMPRESSION(S) / ED DIAGNOSES  Final diagnoses:  Closed displaced fracture of pelvis with routine healing, unspecified part of pelvis, subsequent encounter   Agitated fractures not actually healing yet since it just happened yesterday.  ED Discharge Orders         Ordered    HYDROmorphone (DILAUDID) 2 MG tablet  Every 12 hours PRN        07/29/20 1757           Note:  This  document was prepared using Dragon voice recognition software and may include unintentional dictation  errors.    Arnaldo Natal, MD 07/29/20 [phone]

## 2020-07-29 NOTE — ED Notes (Signed)
Pt transported to CT scan.

## 2020-07-29 NOTE — ED Notes (Signed)
Assumed care of pt at 0300. +pedal pulses bilaterally. Pt attempted to ambulate with RN assistance and walker but failed due to pain. Provider made aware. [facility] made aware of pt condition and agreed to handle pt pain and rehab at center. AO x4. Talking in full sentences with regular and unlabored breathing.

## 2020-07-29 NOTE — ED Provider Notes (Addendum)
[facility] Emergency Department Provider Note  ____________________________________________  Time seen: Approximately 2:14 AM  I have reviewed the triage vital signs and the nursing notes.   HISTORY  Chief Complaint No chief complaint on file.   HPI [name] is a 44 y.o. male with a history of hypertension, R tibial fracture s/p ORIF, COPD who presents for evaluation of pelvic pain. Patient reports that he was assisting a person who had fallen, when he lost his balance and fell onto his buttock. He is complaining of R sided hip radiating down the thigh area. No back pain, neck, headache, head trauma, or LOC. Not on blood thinners. He is complaining of severe constant pain worse with any movement of the leg.    Past Medical History:  Diagnosis Date  . Anxiety   . Arthritis    knees and fingers  . Asthma   . CHF (congestive heart failure) (HCC)   . COPD (chronic obstructive pulmonary disease) (HCC)   . Depression   . Encephalopathy   . GERD (gastroesophageal reflux disease)   . Hypertension   . Hyponatremia 06/2016  . Pneumonia 2017  . Renal disorder    kidney injury  . Seizures [facility](HCC)     Patient Active Problem List   Diagnosis Date Noted  . Closed fracture of right proximal tibia 11/05/2019  . Closed displaced bicondylar fracture of right tibia 12/03/2018  . Cellulitis of left lower extremity   . Cellulitis of left foot 03/07/2018  . COPD (chronic obstructive pulmonary disease) (HCC) 05/03/2017  . Hyponatremia 06/27/2016  . GERD (gastroesophageal reflux disease) 11/11/2015  . Borderline personality disorder (HCC) 11/11/2015  . Alcohol use disorder, severe, in controlled environment (HCC) 11/11/2015  . 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 Tobacco abuse 11/05/2013    Past Surgical History:  Procedure Laterality Date  .  COLONOSCOPY    . ESOPHAGOGASTRODUODENOSCOPY    . EXTERNAL FIXATION LEG Right 12/01/2018   Procedure: EXTERNAL FIXATION LEG;  Surgeon: Juanell FairlyKrasinski, Kevin, MD;  Location: [facility] ORS;  Service: Orthopedics;  Laterality: Right;  . EXTERNAL FIXATION REMOVAL Right 12/06/2018   Procedure: REMOVAL EXTERNAL FIXATION LEG;  Surgeon: Roby LoftsHaddix, Kevin P, MD;  Location: [facility] OR;  Service: Orthopedics;  Laterality: Right;  . FINGER SURGERY Left    5th digit-Fracture-"worse now than before"  . HARDWARE REMOVAL Right 12/06/2019   Procedure: HARDWARE REMOVAL RIGHT TIBIAL PLATEAU;  Surgeon: Roby LoftsHaddix, Kevin P, MD;  Location: [facility] OR;  Service: Orthopedics;  Laterality: Right;  . ORIF TIBIA PLATEAU Right 12/06/2018   Procedure: OPEN REDUCTION INTERNAL FIXATION (ORIF) TIBIAL PLATEAU;  Surgeon: Roby LoftsHaddix, Kevin P, MD;  Location: [facility] OR;  Service: Orthopedics;  Laterality: Right;  . PLEURAL EFFUSION DRAINAGE Right 01/02/2016   Procedure: DRAINAGE OF PLEURAL EFFUSION;  Surgeon: Loreli SlotSteven C Hendrickson, MD;  [facility];  Service: Thoracic;  Laterality: Right;  Marland Kitchen. VIDEO ASSISTED THORACOSCOPY (VATS)/DECORTICATION Right 01/02/2016   Procedure: VIDEO ASSISTED THORACOSCOPY (VATS)/DECORTICATION;  Surgeon: Loreli SlotSteven C Hendrickson, MD;  [facility];  Service: Thoracic;  Laterality: Right;  Marland Kitchen. VIDEO BRONCHOSCOPY N/A 01/02/2016   Procedure: VIDEO BRONCHOSCOPY;  Surgeon: Loreli SlotSteven C Hendrickson, MD;  [facility];  Service: Thoracic;  Laterality: N/A;    Prior to Admission medications   Medication Sig Start Date End Date Taking? Authorizing Provider  acetaminophen (TYLENOL) 500 MG tablet Take 2 tablets (1,000 mg total) by mouth every 8 (eight) hours  as needed for mild pain, moderate pain, fever or headache. 07/29/20 07/29/21  Nita Sickle, MD  busPIRone (BUSPAR) 5 MG tablet Take 5 mg by mouth 2 (two) times daily.    [provider]  Cholecalciferol (VITAMIN D3) 2000 units TABS Take 2,000 Units by mouth daily.     [provider]   cyclobenzaprine (FLEXERIL) 10 MG tablet Take 10 mg by mouth 3 (three) times daily.    [provider]  DEXTROMETHORPHAN HBR PO Take 1 capsule by mouth every 3 (three) hours as needed (cough).    [provider]  diclofenac Sodium (VOLTAREN) 1 % GEL Apply 2 g topically See admin instructions. Apply 2 g transdermally every shift for pain to right knee    [provider]  divalproex (DEPAKOTE ER) 500 MG 24 hr tablet Take 4 tablets (2,000 mg total) by mouth at bedtime. 11/13/15   Jimmy Footman, MD  enalapril (VASOTEC) 10 MG tablet Take 10 mg by mouth daily.    [provider]  escitalopram (LEXAPRO) 20 MG tablet Take 20 mg by mouth daily.     [provider]  esomeprazole (NEXIUM) 40 MG capsule Take 40 mg by mouth daily.     [provider]  ferrous sulfate 325 (65 FE) MG tablet Take 1 tablet (325 mg total) by mouth 2 (two) times daily with a meal. 01/11/16   Regalado, Belkys A, MD  furosemide (LASIX) 20 MG tablet Take 1 tablet (20 mg total) by mouth daily. 03/08/18   Vassie Loll, MD  gabapentin (NEURONTIN) 100 MG capsule Take 100 mg by mouth 3 (three) times daily.     [provider]  guaiFENesin (MUCINEX) 600 MG 12 hr tablet Take 600 mg by mouth 2 (two) times daily.    [provider]  hydrocortisone cream 1 % Apply 1 application topically every 8 (eight) hours as needed for itching.    [provider]  Ipratropium-Albuterol (COMBIVENT RESPIMAT) 20-100 MCG/ACT AERS respimat Inhale 2 puffs into the lungs every 6 (six) hours as needed for wheezing or shortness of breath.    [provider]  lactulose (CHRONULAC) 10 GM/15ML solution Take 45 mLs (30 g total) by mouth 2 (two) times daily. Patient taking differently: Take 30 g by mouth every 12 (twelve) hours as needed (Encephalopathy).  05/07/17   Erick Blinks, MD  loperamide (IMODIUM A-D) 2 MG tablet Take 2 mg by mouth every 4 (four) hours as needed  for diarrhea or loose stools.    [provider]  loratadine (CLARITIN) 10 MG tablet Take 10 mg by mouth daily.    [provider]  Melatonin 5 MG TABS Take 10 mg by mouth at bedtime.    [provider]  Multiple Vitamins-Minerals (THERA-M PO) Take 1 tablet by mouth daily.    [provider]  oxyCODONE 10 MG TABS Take 1 tablet (10 mg total) by mouth every 4 (four) hours as needed for moderate pain or severe pain. 07/29/20 07/29/21  Nita Sickle, MD  Oxycodone HCl 10 MG TABS Take 1 tablet (10 mg total) by mouth every 6 (six) hours as needed. 12/06/19   Despina Hidden, PA-C  primidone (MYSOLINE) 250 MG tablet Take 250 mg by mouth 3 (three) times daily. Take with 50 mg to equal 300 mg 3 times daily    [provider]  primidone (MYSOLINE) 50 MG tablet Take 50 mg by mouth 3 (three) times daily. Take with 250 mg to equal 300 mg  3 times daily    [provider]  senna (SENOKOT) 8.6 MG TABS tablet Take 1 tablet by mouth at bedtime. Hold for loose stools    [provider]  umeclidinium-vilanterol (ANORO ELLIPTA) 62.5-25 MCG/INH AEPB Inhale 1 puff into the lungs daily.    [provider]  zolpidem (AMBIEN) 10 MG tablet Take 10 mg by mouth at bedtime.    [provider]    Allergies Morphine and related and Penicillins  Family History  Problem Relation Age of Onset  . CAD Mother   . Diabetes Mellitus II Father   . Prostate cancer Paternal Grandfather     Social History Social History   Tobacco Use  . Smoking status: Current Every Day Smoker    Packs/day: 0.50    Years: 10.00    Pack years: 5.00    Types: Pipe  . 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 Vaping Use: Never used  Substance Use Topics  . Alcohol use: No    Comment: pt states he no longer drinks 2013  . Drug use: No    Review of Systems  Constitutional: Negative for fever. Eyes: Negative for visual changes. ENT:  Negative for facial injury or neck injury Cardiovascular: Negative for chest injury. Respiratory: Negative for shortness of breath. Negative for chest wall injury. Gastrointestinal: Negative for abdominal pain or injury. Genitourinary: Negative for dysuria. Musculoskeletal: Negative for back injury, + R hip and thigh pain Skin: Negative for laceration/abrasions. Neurological: Negative for head injury.   ____________________________________________   PHYSICAL EXAM:  VITAL SIGNS: ED Triage Vitals  Enc Vitals Group     BP 07/29/20 0042 (!) 145/92     Pulse Rate 07/29/20 0042 72     Resp 07/29/20 0042 18     Temp 07/29/20 0042 98.4 F (36.9 C)     Temp Source 07/29/20 0042 Oral     SpO2 07/29/20 0042 97 %     Weight 07/29/20 0040 179 lb (81.2 kg)     Height 07/29/20 0040 5\' 11"  (1.803 m)     Head Circumference --      Peak Flow --      Pain Score 07/29/20 0039 10     Pain Loc --      Pain Edu? --      Excl. in GC? --     Full spinal precautions maintained throughout the trauma exam. Constitutional: Alert and oriented. No acute distress. Does not appear intoxicated. HEENT Head: Normocephalic and atraumatic. Face: No facial bony tenderness. Stable midface Ears: No hemotympanum bilaterally. No Battle sign Eyes: No eye injury. PERRL. No raccoon eyes Nose: Nontender. No epistaxis. No rhinorrhea Mouth/Throat: Mucous membranes are moist. No oropharyngeal blood. No dental injury. Airway patent without stridor. Normal voice. Neck: no C-collar. No midline c-spine tenderness.  Cardiovascular: Normal rate, regular rhythm. Normal and symmetric distal pulses are present in all extremities. Pulmonary/Chest: Chest wall is stable and nontender to palpation/compression. Normal respiratory effort. Breath sounds are normal. No crepitus.  Abdominal: Soft, nontender, non distended. Musculoskeletal: Tenderness with range of motion of the right hip. Nontender with normal full range of motion in  all other extremities. No deformities. No thoracic or lumbar midline spinal tenderness. Pelvis is stable. Skin: Skin is warm, dry and intact. No abrasions or contutions. Psychiatric: Speech and behavior are appropriate. Neurological: Normal speech and language. Moves all extremities to command. No gross focal neurologic deficits are appreciated.  Glascow  Coma Score: 4 - Opens eyes on own 6 - Follows simple motor commands 5 - Alert and oriented GCS: 15   ____________________________________________   LABS (all labs ordered are listed, but only abnormal results are displayed)  Labs Reviewed  SARS CORONAVIRUS 2 BY RT PCR (HOSPITAL ORDER, PERFORMED IN [facility])   ____________________________________________  EKG  none  ____________________________________________  RADIOLOGY  I have personally reviewed the images performed during this visit and I agree with the Radiologist's read.   Interpretation by Radiologist:  DG Pelvis 1-2 Views  Result Date: 07/29/2020 CLINICAL DATA:  Initial evaluation for acute trauma, fall. EXAM: PELVIS - 1-2 VIEW COMPARISON:  Prior study from 06/01/2007. FINDINGS: Cortical irregularity seen extending through the right superior and inferior pubic rami, consistent with acute minimally displaced fractures. No associated pubic diastasis. SI joints remain approximated. Remainder of the visualized bony pelvis intact. No other acute osseous abnormality about the hips. Underlying osteopenia.  No visible soft tissue injury. IMPRESSION: Acute mildly displaced fractures extending through the right superior and inferior pubic rami. Electronically Signed   By: Rise Mu M.D.   On: 07/29/2020 01:30   CT PELVIS W CONTRAST  Result Date: 07/29/2020 CLINICAL DATA:  Pelvic fractures EXAM: CT PELVIS WITH CONTRAST TECHNIQUE: Multidetector CT imaging of the pelvis was performed using the standard protocol following the bolus administration of  intravenous contrast. CONTRAST:  OMNIPAQUE IOHEXOL 300 MG/ML  SOLN COMPARISON:  Plain films earlier today FINDINGS: Urinary Tract:  No abnormality visualized. Bowel:  Unremarkable visualized pelvic bowel loops. Vascular/Lymphatic: Aorta and iliac vessels normal caliber. No adenopathy. Reproductive:  No visible focal abnormality. Other:  No free fluid or free air. Musculoskeletal: Fractures seen through the right superior and inferior pubic rami as seen by plain film. No proximal femoral abnormality. There is hematoma seen within the right extraperitoneal soft tissues adjacent to the superior pubic ramus fracture. Mild enlargement of the adjacent adductor muscles and piriformis muscle, likely mild intramuscular hematoma. No active extravasation of contrast seen. IMPRESSION: Fractures through the right superior and inferior pubic ramus with adjacent extraperitoneal hematoma and probable piriformis/adductor muscle hematoma. No active extravasation of contrast. Electronically Signed   By: Charlett Nose M.D.   On: 07/29/2020 03:13   DG FEMUR, MIN 2 VIEWS RIGHT  Result Date: 07/29/2020 CLINICAL DATA:  Initial evaluation for acute trauma, fall. EXAM: RIGHT FEMUR 2 VIEWS COMPARISON:  None. FINDINGS: Acute mildly displaced fractures seen extending through the right superior and inferior pubic rami/pubis symphysis. Remainder the visualized bony pelvis intact. No other acute fracture dislocation about the right femur. Advanced for age degenerative changes noted at the visualized right knee. Sequelae of prior ORIF with hardware removal noted at the distal femoral shaft. Advanced osteopenia for age. No soft tissue abnormality. IMPRESSION: 1. Acute mildly displaced fractures extending through the right superior and inferior pubic rami/pubis symphysis. 2. No other acute fracture or dislocation about the right femur. Electronically Signed   By: Rise Mu M.D.   On: 07/29/2020 01:33      ____________________________________________   PROCEDURES  Procedure(s) performed: None Procedures Critical Care performed:  None ____________________________________________   INITIAL IMPRESSION / ASSESSMENT AND PLAN / ED COURSE  44 y.o. male with a history of hypertension, R tibial fracture s/p ORIF, COPD who presents for evaluation of pelvic pain status post mechanical fall. X-ray done in triage showing minimally displaced pelvic fractures, no hip fracture, no femur fracture. We will get a CT to rule out any active  bleeding associated with it. Will give a dose of IV fentanyl for pain control and then switch to p.o. medication. If patient's pain is under control and he is able to ambulate anticipate discharge home with PCP follow-up otherwise will admit for pain control and PT. Old medical records reviewed.    _________________________ 5:00 AM on 07/29/2020 -----------------------------------------  CT showing extraperitoneal and muscle hematoma but no active extravasation. Patient remains HD stable. Attempt to ambulate after 2 rounds of IV narcotics and PO pain meds and patient is unable to bear any weight even with assistance and a walker. Patient is a resident at [facility] and therefore may be able to return to his SNF if they are able to treat the pain and provide PT in house. I spoke with a nurse there who will discuss it with the supervisor this morning. If they are unable to provide the care needed, will consult hospitalist for admission.   _________________________ 5:32 AM on 07/29/2020 -----------------------------------------  The nurse from [facility] called back after speaking with the supervisor and told us that they are able to manage pain and physical therapy for the patient in house.  Therefore patient be discharged back.  Pain regimen with 1000 g of Tylenol every 8 hours and 10 mg of oxycodone as needed every 4 hours.  Recommended follow-up within a week with  patient's orthopedic surgeon.  Discussed standard return precautions. ____________________________________________  Please note:  Patient was evaluated in Emergency Department today for the symptoms described in the history of present illness. Patient was evaluated in the context of the global COVID-19 pandemic, which necessitated consideration that the patient might be at risk for infection with the SARS-CoV-2 virus that causes COVID-19. Institutional protocols and algorithms that pertain to the evaluation of patients at risk for COVID-19 are in a state of rapid change based on information released by regulatory bodies including the CDC and federal and state organizations. These policies and algorithms were followed during the patient's care in the ED.  Some ED evaluations and interventions may be delayed as a result of limited staffing during the pandemic.   ____________________________________________   FINAL CLINICAL IMPRESSION(S) / ED DIAGNOSES   Final diagnoses:  Fall, initial encounter  Multiple closed fractures of pelvis without disruption of pelvic ring, initial encounter (HCC)  Intractable pain      NEW MEDICATIONS STARTED DURING THIS VISIT:  ED Discharge Orders         Ordered    acetaminophen (TYLENOL) 500 MG tablet  Every 8 hours PRN        07/29/20 0530    oxyCODONE 10 MG TABS  Every 4 hours PRN        07/29/20 0530           Note:  This document was prepared using Dragon voice recognition software and may include unintentional dictation errors.    Don Perking, [place], MD 07/29/20 0424    Nita Sickle, MD 07/29/20 1660    Nita Sickle, MD 07/29/20 [phone]

## 2020-07-29 NOTE — ED Notes (Signed)
COVID swab not necessary as pt no longer going to be admitted.

## 2020-07-29 NOTE — ED Notes (Signed)
Dr. Dolores Frame notified the patient XR has pelvic fracture.

## 2020-12-07 ENCOUNTER — Emergency Department
Admission: EM | Admit: 2020-12-07 | Discharge: 2020-12-07 | Disposition: A | Discharge status: Home / Self Care | Payer: Medicare Other | Attending: Emergency Medicine | Admitting: Emergency Medicine

## 2020-12-07 ENCOUNTER — Other Ambulatory Visit: Payer: Self-pay

## 2020-12-07 ENCOUNTER — Emergency Department: Payer: Medicare Other

## 2020-12-07 ENCOUNTER — Encounter: Payer: Self-pay | Admitting: Emergency Medicine

## 2020-12-07 DIAGNOSIS — J449 Chronic obstructive pulmonary disease, unspecified: Secondary | ICD-10-CM | POA: Diagnosis not present

## 2020-12-07 DIAGNOSIS — S62339A Displaced fracture of neck of unspecified metacarpal bone, initial encounter for closed fracture: Secondary | ICD-10-CM

## 2020-12-07 DIAGNOSIS — S62316A Displaced fracture of base of fifth metacarpal bone, right hand, initial encounter for closed fracture: Secondary | ICD-10-CM | POA: Insufficient documentation

## 2020-12-07 DIAGNOSIS — S6991XA Unspecified injury of right wrist, hand and finger(s), initial encounter: Secondary | ICD-10-CM | POA: Diagnosis present

## 2020-12-07 DIAGNOSIS — W228XXA Striking against or struck by other objects, initial encounter: Secondary | ICD-10-CM | POA: Insufficient documentation

## 2020-12-07 DIAGNOSIS — F17200 Nicotine dependence, unspecified, uncomplicated: Secondary | ICD-10-CM | POA: Diagnosis not present

## 2020-12-07 DIAGNOSIS — Z79899 Other long term (current) drug therapy: Secondary | ICD-10-CM | POA: Insufficient documentation

## 2020-12-07 DIAGNOSIS — I110 Hypertensive heart disease with heart failure: Secondary | ICD-10-CM | POA: Diagnosis not present

## 2020-12-07 DIAGNOSIS — I509 Heart failure, unspecified: Secondary | ICD-10-CM | POA: Diagnosis not present

## 2020-12-07 DIAGNOSIS — J45909 Unspecified asthma, uncomplicated: Secondary | ICD-10-CM | POA: Diagnosis not present

## 2020-12-07 DIAGNOSIS — R52 Pain, unspecified: Secondary | ICD-10-CM

## 2020-12-07 MED ORDER — OXYCODONE HCL 5 MG PO TABS
5.0000 mg | ORAL_TABLET | Freq: Once | ORAL | Status: AC
  Administered 2020-12-07: 5 mg via ORAL
  Filled 2020-12-07: qty 1

## 2020-12-07 NOTE — ED Notes (Signed)
This RN called and spoke with staff at [facility], states will be able to provide transportation back to facility for patient.

## 2020-12-07 NOTE — Discharge Instructions (Addendum)
Follow-up with Dr. Allena Katz who is orthopedist on call if any continued problems with your hand.  Ice and elevation.  Wear splint for protection and support until you have been seen by the orthopedist.  Take Tylenol or ibuprofen as needed for pain.  Continue with your regular medication.

## 2020-12-07 NOTE — ED Notes (Signed)
This RN called and spoke with [facility], states spoke with [employer] who would be able to provide ride, notified that they have not arrived to get patient at this time.

## 2020-12-07 NOTE — ED Triage Notes (Signed)
Pt In via EMS from [facility] with c/o punching a wall Friday and may have a fx to right hand. Hand swollen and bruised. COVID (+) Friday. Pt takes oxy for pain already.

## 2020-12-07 NOTE — ED Provider Notes (Signed)
[name] Emergency Department Provider Note   ____________________________________________   Event Date/Time   First MD Initiated Contact with Patient 12/07/20 1246     (approximate)  I have reviewed the triage vital signs and the nursing notes.   HISTORY   Chief Complaint Hand Pain   HPI [name] is a 45 y.o. male presents to the ED via EMS from [facility] healthcare with complaint of right hand pain.  Patient states he got mad Friday which is approximately 3 days ago and punched a wall.  Patient complains of his hand being swollen mostly over his fifth finger.  Patient reports that he already takes oxycodone for pain due to another injury.  He rates his pain as 9 out of 10.       Past Medical History:  Diagnosis Date  . Anxiety   . Arthritis    knees and fingers  . Asthma   . CHF (congestive heart failure) (HCC)   . COPD (chronic obstructive pulmonary disease) (HCC)   . Depression   . Encephalopathy   . GERD (gastroesophageal reflux disease)   . Hypertension   . Hyponatremia 06/2016  . Pneumonia 2017  . Renal disorder    kidney injury  . Seizures [facility])     Patient Active Problem List   Diagnosis Date Noted  . Closed fracture of right proximal tibia 11/05/2019  . Closed displaced bicondylar fracture of right tibia 12/03/2018  . Cellulitis of left lower extremity   . Cellulitis of left foot 03/07/2018  . COPD (chronic obstructive pulmonary disease) (HCC) 05/03/2017  . Hyponatremia 06/27/2016  . GERD (gastroesophageal reflux disease) 11/11/2015  . Borderline personality disorder (HCC) 11/11/2015  . Alcohol use disorder, severe, in controlled environment (HCC) 11/11/2015  . 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 Tobacco abuse 11/05/2013    Past Surgical History:  Procedure Laterality Date  . COLONOSCOPY    .  ESOPHAGOGASTRODUODENOSCOPY    . EXTERNAL FIXATION LEG Right 12/01/2018   Procedure: EXTERNAL FIXATION LEG;  Surgeon: Juanell Fairly, MD;  Location: [facility] ORS;  Service: Orthopedics;  Laterality: Right;  . EXTERNAL FIXATION REMOVAL Right 12/06/2018   Procedure: REMOVAL EXTERNAL FIXATION LEG;  Surgeon: Roby Lofts, MD;  Location: [facility] OR;  Service: Orthopedics;  Laterality: Right;  . FINGER SURGERY Left    5th digit-Fracture-"worse now than before"  . HARDWARE REMOVAL Right 12/06/2019   Procedure: HARDWARE REMOVAL RIGHT TIBIAL PLATEAU;  Surgeon: Roby Lofts, MD;  Location: [facility] OR;  Service: Orthopedics;  Laterality: Right;  . ORIF TIBIA PLATEAU Right 12/06/2018   Procedure: OPEN REDUCTION INTERNAL FIXATION (ORIF) TIBIAL PLATEAU;  Surgeon: Roby Lofts, MD;  Location: [facility] OR;  Service: Orthopedics;  Laterality: Right;  . PLEURAL EFFUSION DRAINAGE Right 01/02/2016   Procedure: DRAINAGE OF PLEURAL EFFUSION;  Surgeon: Loreli Slot, MD;  [facility] OR;  Service: Thoracic;  Laterality: Right;  Marland Kitchen VIDEO ASSISTED THORACOSCOPY (VATS)/DECORTICATION Right 01/02/2016   Procedure: VIDEO ASSISTED THORACOSCOPY (VATS)/DECORTICATION;  Surgeon: Loreli Slot, MD;  Location: [facility] OR;  Service: Thoracic;  Laterality: Right;  Marland Kitchen VIDEO BRONCHOSCOPY N/A 01/02/2016   Procedure: VIDEO BRONCHOSCOPY;  Surgeon: Loreli Slot, MD;  Location: [facility] OR;  Service: Thoracic;  Laterality: N/A;    Prior to Admission medications   Medication Sig Start Date End Date Taking? Authorizing Provider  acetaminophen (TYLENOL) 500 MG tablet Take 2 tablets (1,000 mg total)  by mouth every 8 (eight) hours as needed for mild pain, moderate pain, fever or headache. 07/29/20 07/29/21  Nita Sickle, MD  busPIRone (BUSPAR) 5 MG tablet Take 5 mg by mouth 2 (two) times daily.    [provider]  Cholecalciferol (VITAMIN D3) 2000 units TABS Take 2,000 Units by mouth daily.     [provider]  cyclobenzaprine  (FLEXERIL) 10 MG tablet Take 10 mg by mouth 3 (three) times daily.    [provider]  DEXTROMETHORPHAN HBR PO Take 1 capsule by mouth every 3 (three) hours as needed (cough).    [provider]  diclofenac Sodium (VOLTAREN) 1 % GEL Apply 2 g topically See admin instructions. Apply 2 g transdermally every shift for pain to right knee    [provider]  divalproex (DEPAKOTE ER) 500 MG 24 hr tablet Take 4 tablets (2,000 mg total) by mouth at bedtime. 11/13/15   Jimmy Footman, MD  enalapril (VASOTEC) 10 MG tablet Take 10 mg by mouth daily.    [provider]  escitalopram (LEXAPRO) 20 MG tablet Take 20 mg by mouth daily.     [provider]  esomeprazole (NEXIUM) 40 MG capsule Take 40 mg by mouth daily.     [provider]  ferrous sulfate 325 (65 FE) MG tablet Take 1 tablet (325 mg total) by mouth 2 (two) times daily with a meal. 01/11/16   Regalado, Belkys A, MD  furosemide (LASIX) 20 MG tablet Take 1 tablet (20 mg total) by mouth daily. 03/08/18   Vassie Loll, MD  gabapentin (NEURONTIN) 100 MG capsule Take 100 mg by mouth 3 (three) times daily.     [provider]  guaiFENesin (MUCINEX) 600 MG 12 hr tablet Take 600 mg by mouth 2 (two) times daily.    [provider]  hydrocortisone cream 1 % Apply 1 application topically every 8 (eight) hours as needed for itching.    [provider]  Ipratropium-Albuterol (COMBIVENT RESPIMAT) 20-100 MCG/ACT AERS respimat Inhale 2 puffs into the lungs every 6 (six) hours as needed for wheezing or shortness of breath.    [provider]  lactulose (CHRONULAC) 10 GM/15ML solution Take 45 mLs (30 g total) by mouth 2 (two) times daily. Patient taking differently: Take 30 g by mouth every 12 (twelve) hours as needed (Encephalopathy).  05/07/17   Erick Blinks, MD  loperamide (IMODIUM A-D) 2 MG tablet Take 2 mg by mouth every 4 (four) hours as needed for diarrhea or  loose stools.    [provider]  loratadine (CLARITIN) 10 MG tablet Take 10 mg by mouth daily.    [provider]  Melatonin 5 MG TABS Take 10 mg by mouth at bedtime.    [provider]  Multiple Vitamins-Minerals (THERA-M PO) Take 1 tablet by mouth daily.    [provider]  oxyCODONE 10 MG TABS Take 1 tablet (10 mg total) by mouth every 4 (four) hours as needed for moderate pain or severe pain. 07/29/20 07/29/21  Nita Sickle, MD  Oxycodone HCl 10 MG TABS Take 1 tablet (10 mg total) by mouth every 6 (six) hours as needed. 12/06/19   Despina Hidden, PA-C  primidone (MYSOLINE) 250 MG tablet Take 250 mg by mouth 3 (three) times daily. Take with 50 mg to equal 300 mg 3 times daily    [provider]  primidone (MYSOLINE) 50 MG tablet Take 50 mg by mouth 3 (three) times daily. Take with  250 mg to equal 300 mg 3 times daily    [provider]  senna (SENOKOT) 8.6 MG TABS tablet Take 1 tablet by mouth at bedtime. Hold for loose stools    [provider]  umeclidinium-vilanterol (ANORO ELLIPTA) 62.5-25 MCG/INH AEPB Inhale 1 puff into the lungs daily.    [provider]  zolpidem (AMBIEN) 10 MG tablet Take 10 mg by mouth at bedtime.    [provider]    Allergies Morphine and related and Penicillins  Family History  Problem Relation Age of Onset  . CAD Mother   . Diabetes Mellitus II Father   . Prostate cancer Paternal Grandfather     Social History Social History   Tobacco Use  . Smoking status: Current Every Day Smoker    Packs/day: 0.50    Years: 10.00    Pack years: 5.00    Types: Pipe  . Smokeless tobacco: Former NeurosurgeonUser    Quit date: 2013  Advertising account plannerVaping Use  . Vaping Use: Never used  Substance Use Topics  . Alcohol use: No    Comment: pt states he no longer drinks 2013  . Drug use: No    Review of Systems Constitutional: No fever/chills Eyes: No visual changes. Cardiovascular: Denies chest  pain. Respiratory: Denies shortness of breath. Gastrointestinal:  No nausea, no vomiting Musculoskeletal: Right hand pain. Skin: Negative for rash. Neurological: Negative for headaches, focal weakness or numbness. Psychiatric:  Depression. ____________________________________________   PHYSICAL EXAM:  VITAL SIGNS: ED Triage Vitals  Enc Vitals Group     BP 12/07/20 1158 (!) 146/82     Pulse Rate 12/07/20 1158 (!) 58     Resp 12/07/20 1158 20     Temp 12/07/20 1158 98.5 F (36.9 C)     Temp Source 12/07/20 1158 Oral     SpO2 12/07/20 1158 97 %     Weight 12/07/20 1145 179 lb (81.2 kg)     Height 12/07/20 1145 5\' 11"  (1.803 m)     Head Circumference --      Peak Flow --      Pain Score 12/07/20 1145 9     Pain Loc --      Pain Edu? --      Excl. in GC? --     Constitutional: Alert and oriented. Well appearing and in no acute distress. Eyes: Conjunctivae are normal.  Head: Atraumatic. Nose: No trauma. Mouth/Throat: No trauma. Neck: No stridor.   Cardiovascular: Normal rate, regular rhythm. Grossly normal heart sounds.  Good peripheral circulation. Respiratory: Normal respiratory effort.  No retractions. Lungs CTAB. Gastrointestinal: Soft and nontender. No distention. Musculoskeletal: Examination of the right hand there is a chronic deformity of the fourth and fifth digits at the PIP joints.  Patient is unable to fully extend his fourth and fifth digit due to the chronic deformity and states that this is his baseline.  There is moderate tenderness on palpation of the fifth metacarpal with soft tissue edema.  No open areas are noted on the skin.  Ulcers present.  Capillary refills less than 3 seconds. Neurologic:  Normal speech and language. No gross focal neurologic deficits are appreciated.  Skin:  Skin is warm, dry and intact.  No ecchymosis or abrasions were noted. Psychiatric: Mood and affect are normal. Speech and behavior are  normal.  ____________________________________________   LABS (all labs ordered are listed, but only abnormal results are displayed)  Labs Reviewed - No data to display ____________________________________________  RADIOLOGY I,  Tommi Rumps, personally viewed and evaluated these images (plain radiographs) as part of my medical decision making, as well as reviewing the written report by the radiologist.    Official radiology report(s): DG Hand Complete Right  Result Date: 12/07/2020 CLINICAL DATA:  Right hand pain after punching wall Friday. EXAM: RIGHT HAND - COMPLETE 3+ VIEW COMPARISON:  Right hand radiograph November 03, 2016 FINDINGS: Cortical irregularity along the base of the fifth metacarpal which are new from prior radiograph, without intra-articular extension. There are remote healed fractures of the necks of the fourth and fifth metacarpals as well as remote healed fracture of the proximal fifth metacarpal. All of the fingers are in flexion, unchanged from prior. Similar markedly advanced for age first [facility] osteoarthritis with bone-on-bone joint space narrowing, subchondral sclerosis and osteophytosis. Soft tissues are unremarkable. IMPRESSION: 1. Probable acute nondisplaced fracture of the base of the fifth metacarpal. No intra-articular extension. 2. Chronic healed fractures of the fourth and fifth metacarpals. 3. Stable markedly advanced for age first [facility] joint osteoarthritis. Electronically Signed   By: Maudry Mayhew MD   On: 12/07/2020 12:50    ____________________________________________   PROCEDURES  Procedure(s) performed (including Critical Care):  Procedures  OCL gutter splint was applied to the patient's hand by this provider. ____________________________________________   INITIAL IMPRESSION / ASSESSMENT AND PLAN / ED COURSE  As part of my medical decision making, I reviewed the following data within the electronic MEDICAL RECORD NUMBER Notes from prior ED visits  and [place] Controlled Substance Database  45 year old male was brought to the ED via EMS from [facility] healthcare with complaint of right hand pain after he punched a wall because he was mad.  Patient has continued to complain of pain to his right hand despite taking oxycodone at the nursing facility.  Patient was made aware that he does have a fracture of the distal fifth metacarpal.  A splint was applied to his hand by this provider and patient was made aware that he is to ice and elevate to reduce swelling and help with pain.  Patient already takes oxycodone at the nursing facility.  Patient is to follow-up with Dr. Allena Katz who is on-call for orthopedics.  Transportation at [facility] was called and they are making arrangements to pick the patient up from the emergency department.  ____________________________________________   FINAL CLINICAL IMPRESSION(S) / ED DIAGNOSES  Final diagnoses:  Closed boxer's fracture, initial encounter     ED Discharge Orders    None      *Please note:  [name] was evaluated in Emergency Department on 12/07/2020 for the symptoms described in the history of present illness. He was evaluated in the context of the global COVID-19 pandemic, which necessitated consideration that the patient might be at risk for infection with the SARS-CoV-2 virus that causes COVID-19. Institutional protocols and algorithms that pertain to the evaluation of patients at risk for COVID-19 are in a state of rapid change based on information released by regulatory bodies including the CDC and federal and state organizations. These policies and algorithms were followed during the patient's care in the ED.  Some ED evaluations and interventions may be delayed as a result of limited staffing during and the pandemic.*   Note:  This document was prepared using Dragon voice recognition software and may include unintentional dictation errors.    Tommi Rumps, PA-C 12/07/20  1556    Minna Antis, MD 12/08/20 [phone]
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
## 2020-12-07 NOTE — ED Notes (Signed)
[facility] contacted and spoke with [place] with transportation arrangements. Lesl

## 2020-12-07 NOTE — ED Notes (Addendum)
This RN called [employer] and confirmed with staff that [facility] [facility] is coming and is approx away to pick patient up.

## 2020-12-26 IMAGING — CR DG CHEST 2V
1 series · 2 of 2 positions shown · non-contrast
Comparison: 02/17/2018, 05/02/2017, 04/03/2017 and 11/19/2016

CLINICAL DATA: Chest pain. Shortness of breath.

EXAM:
CHEST - 2 VIEW

[Series 1: dg chest 2 view · 0.14mm/px · 2 of 2 slices shown]
[im 1/2]
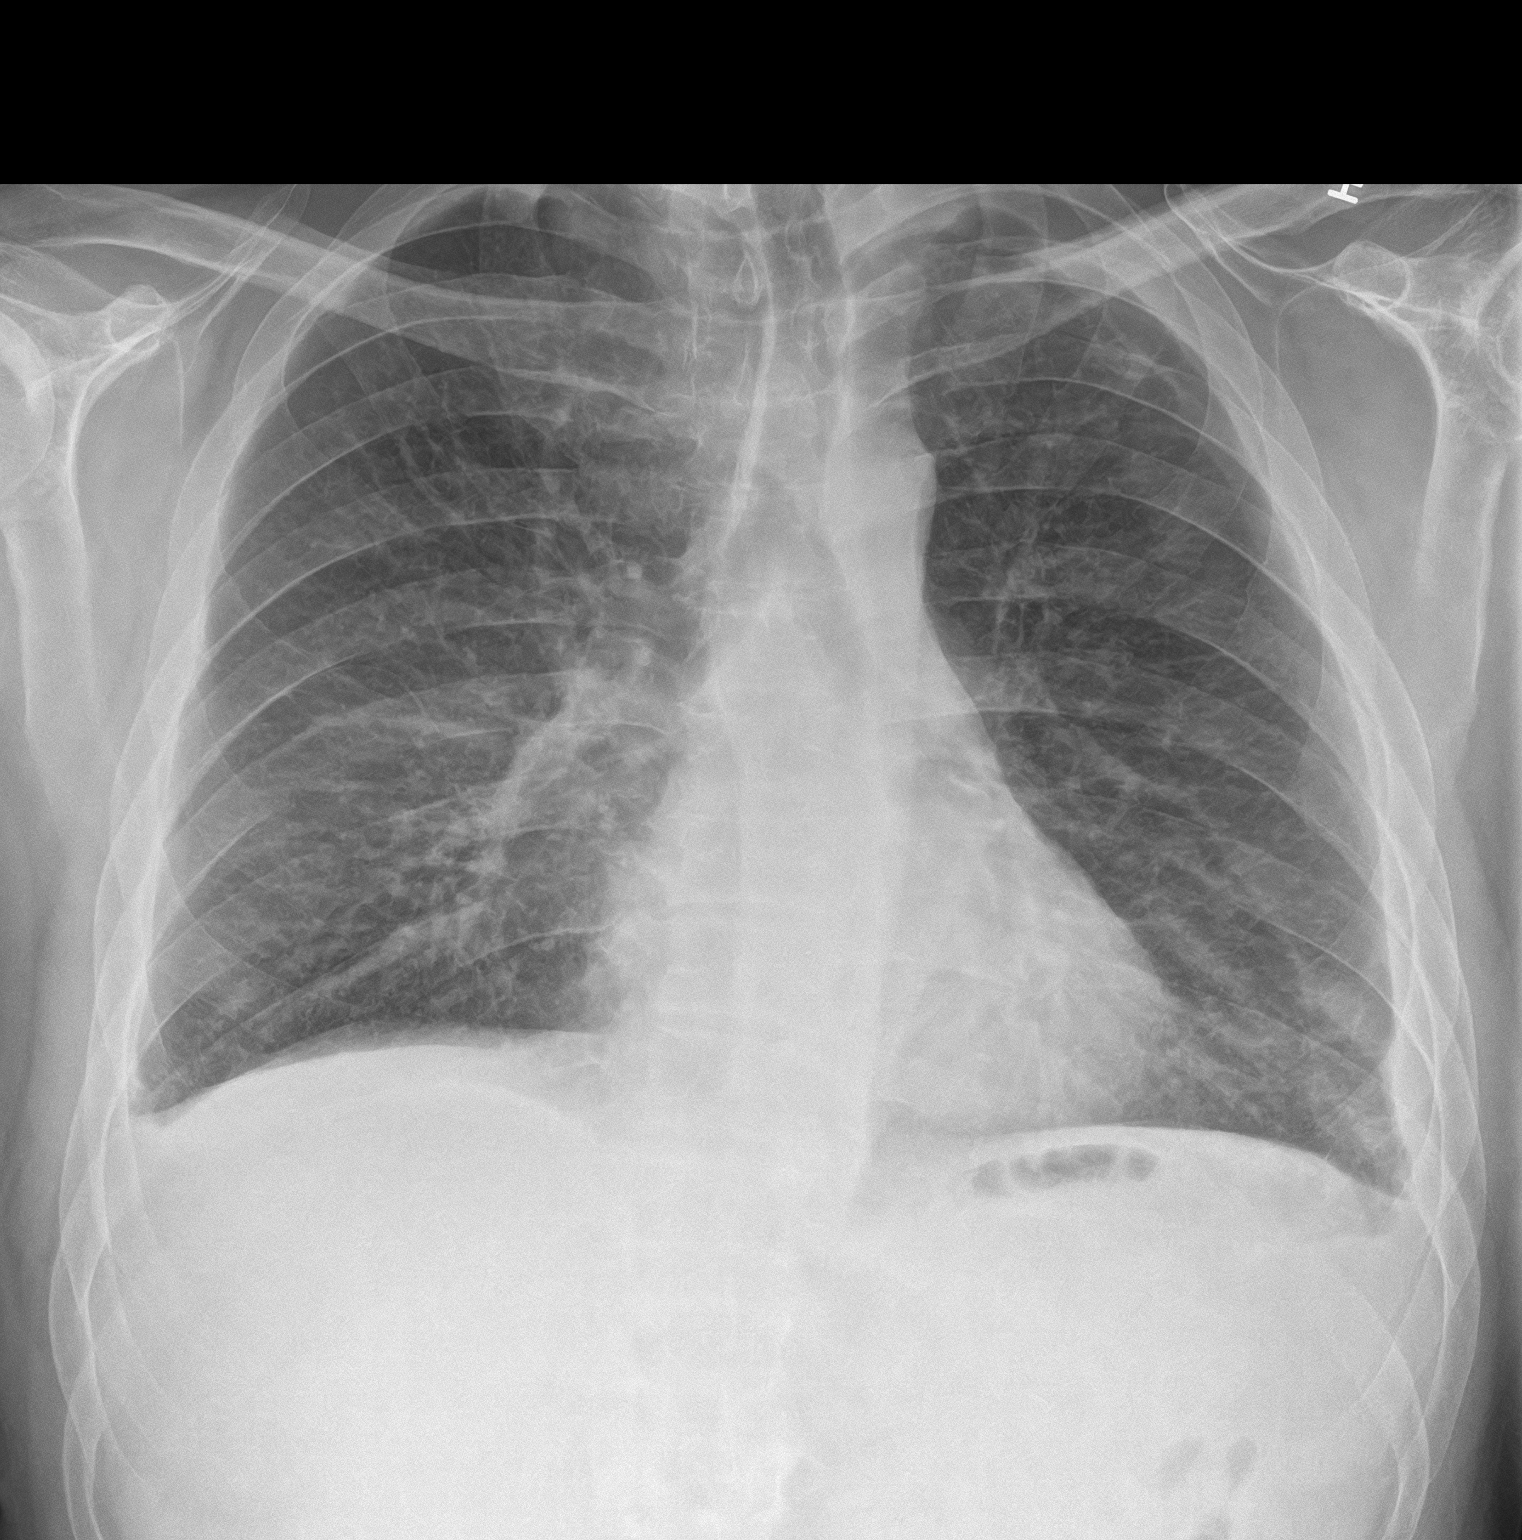
[im 2/2]
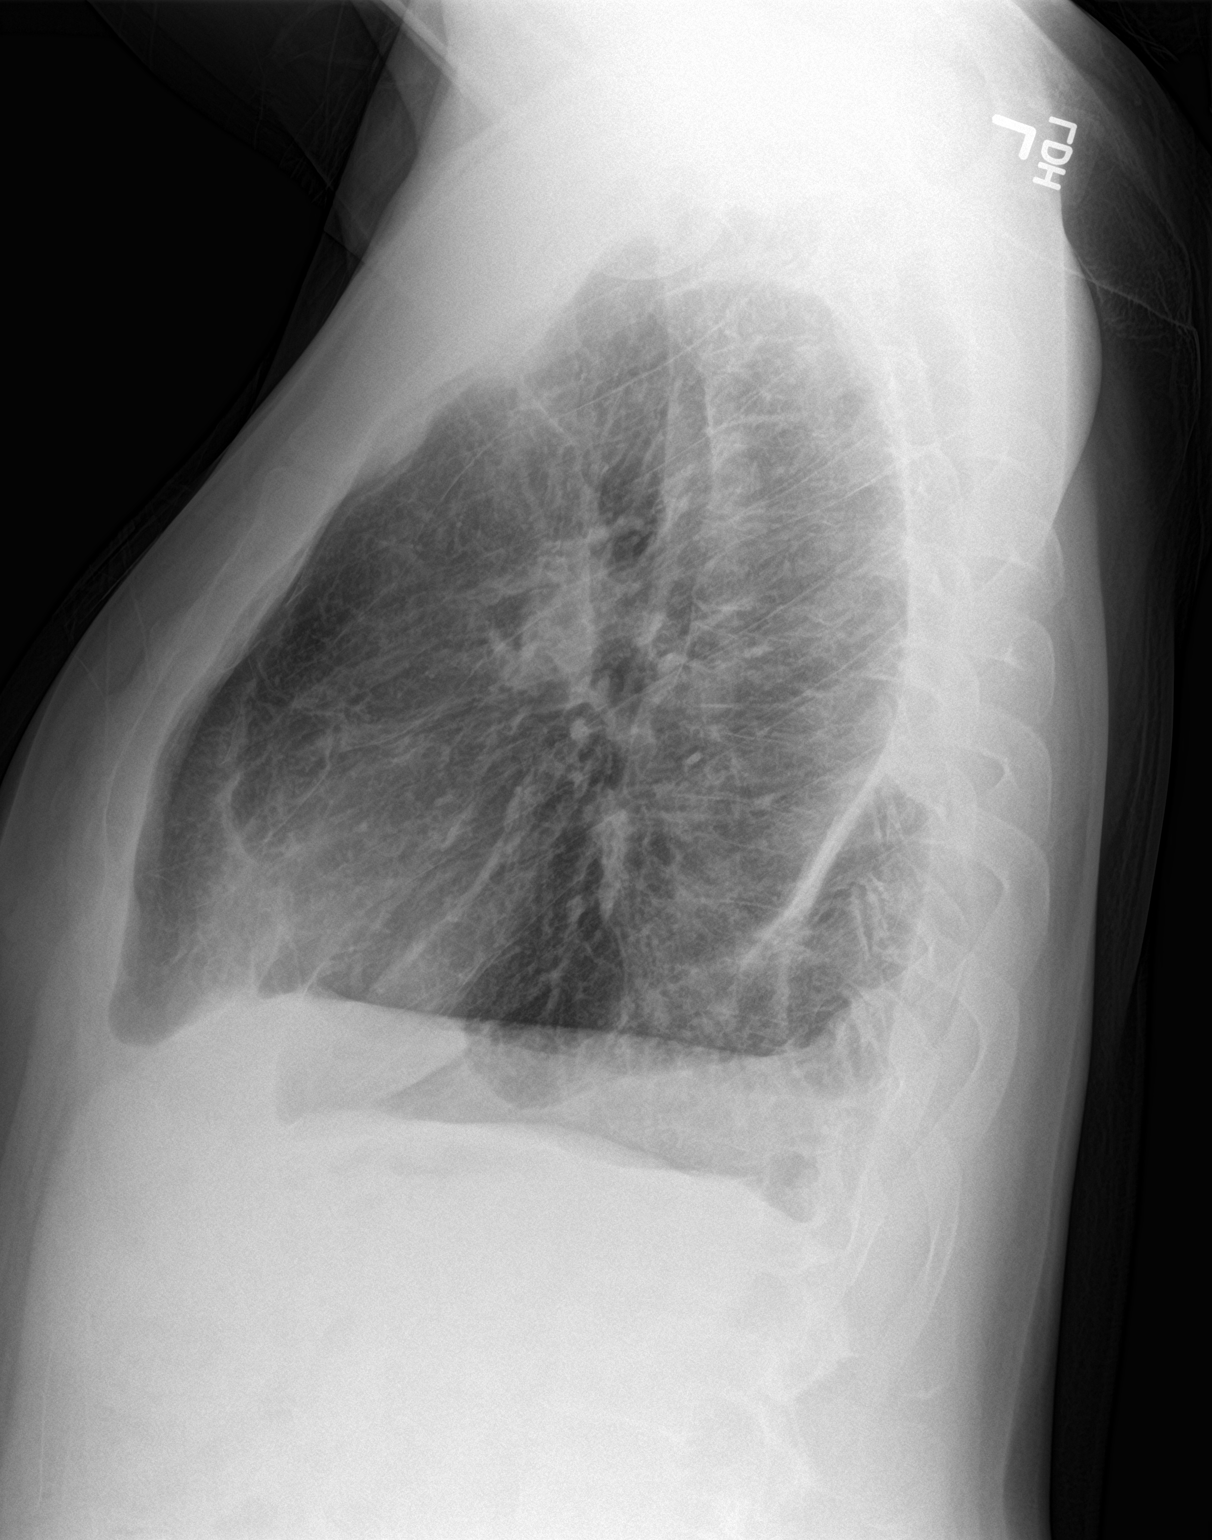

[2 of 2 positions shown; findings below may reference images not displayed]

FINDINGS: The heart size and pulmonary vascularity are normal. There is
chronic pleural thickening laterally on the left and in the left
lung apex. There is blunting of the right costophrenic angle which
could represent a tiny effusion. There is slight scarring or
atelectasis posteriorly. No discrete infiltrates. No acute bone
abnormality.
IMPRESSION: Possible tiny right pleural effusion. Chronic pleural thickening on
the left.

## 2021-01-05 IMAGING — CR DG CHEST 1V
1 series · 1 of 1 positions shown · non-contrast
Comparison: 11/21/2018

CLINICAL DATA: Preoperative respiratory evaluation for knee
surgery.

EXAM:
CHEST  1 VIEW

[dg chest 1 view]
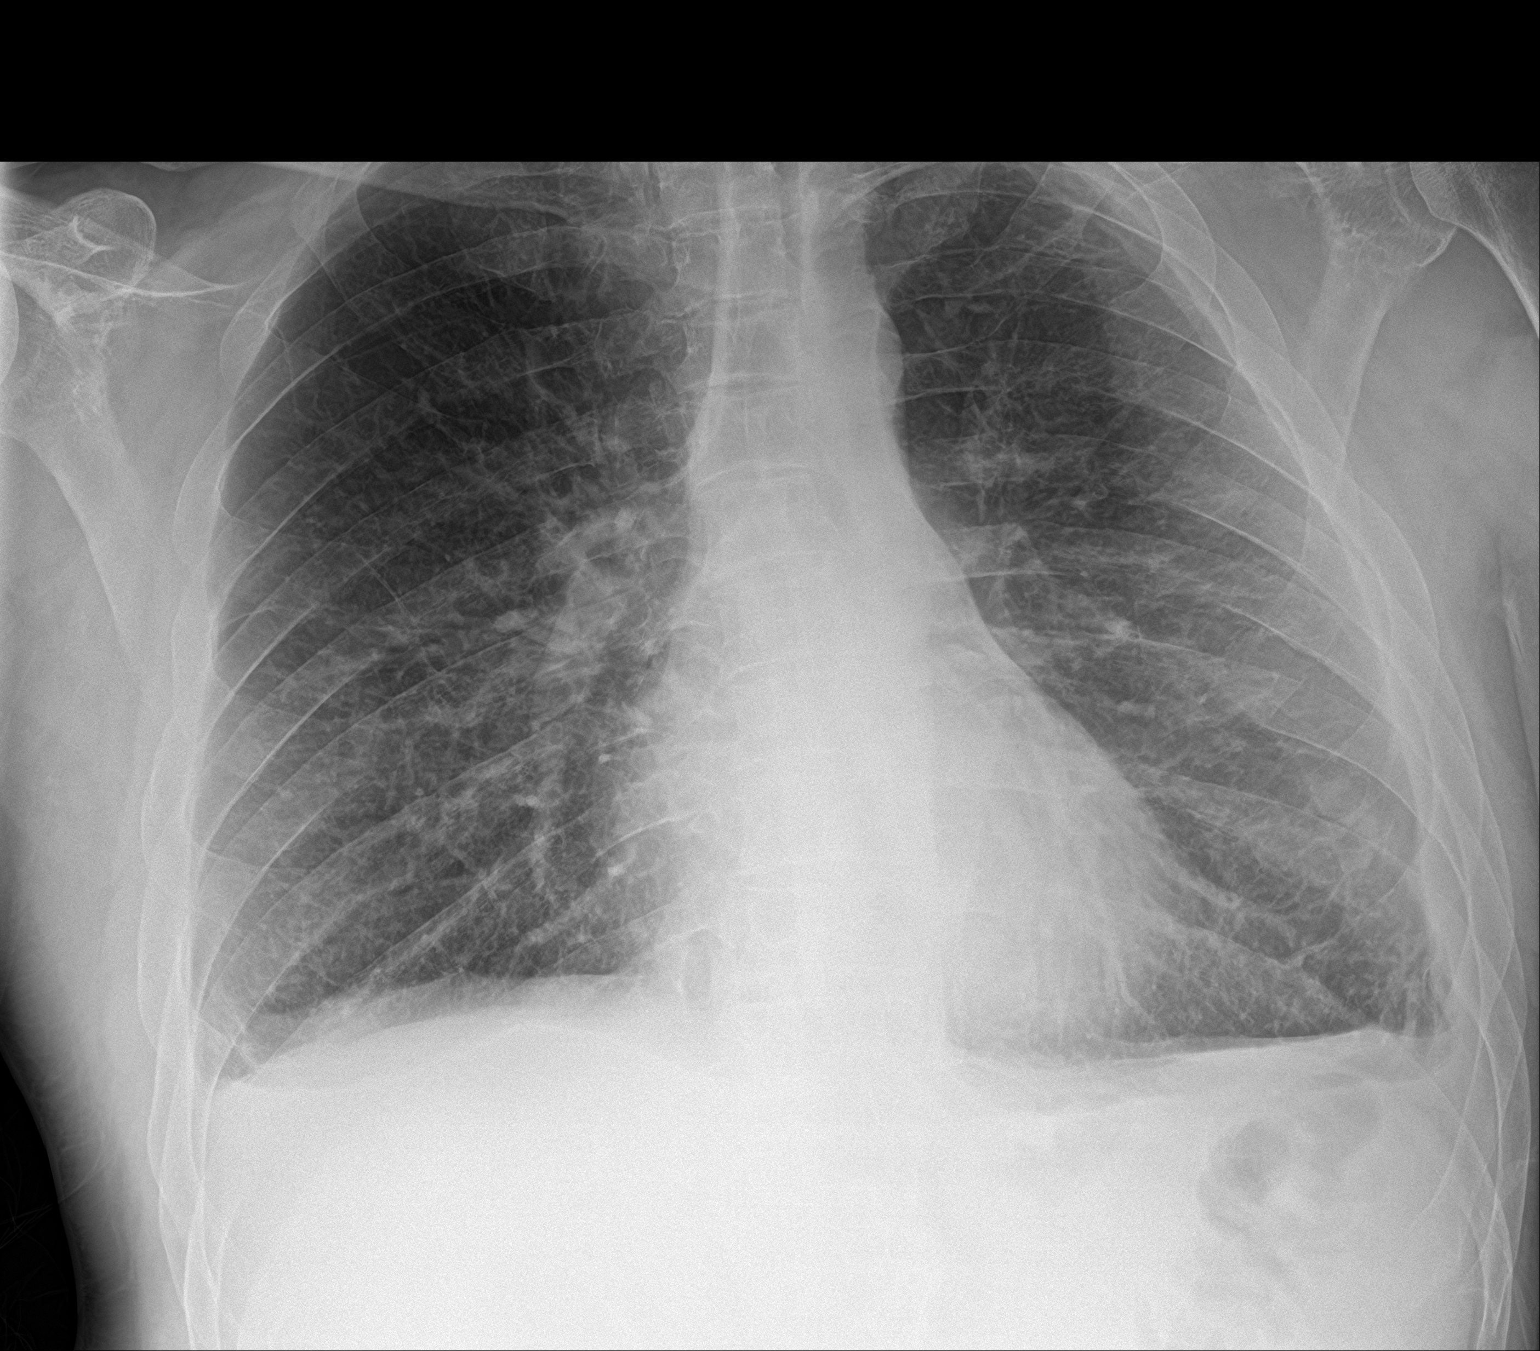

[1 of 1 positions shown; findings below may reference images not displayed]

FINDINGS: Lungs are hyperexpanded. Stable appearance of pleuroparenchymal
opacity in both lung bases, left greater than right, compatible with
scarring given the presence since chest CT of 04/22/2017. The
cardiopericardial silhouette is within normal limits for size. The
visualized bony structures of the thorax are intact.
IMPRESSION: No active disease.
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
## 2021-01-05 IMAGING — RF DG KNEE 1-2V*R*
1 series · 7 of 7 positions shown · non-contrast
Comparison: 12/01/2018 at [DATE] a.m.

CLINICAL DATA: Operative imaging from placement of an external
fixator for a proximal right tibial fracture.

EXAM:
RIGHT KNEE - 1-2 VIEW; DG C-ARM 1-60 MIN-NO REPORT

[Series 1: run · 7 of 7 slices shown]
[im 1/7]
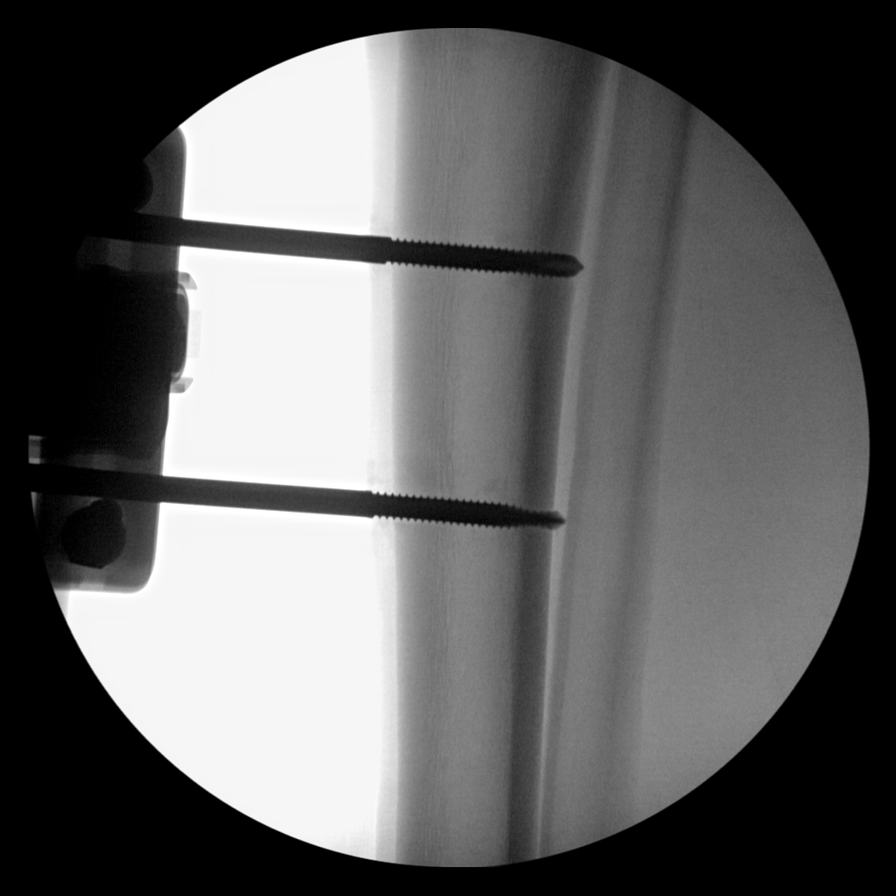
[im 2/7]
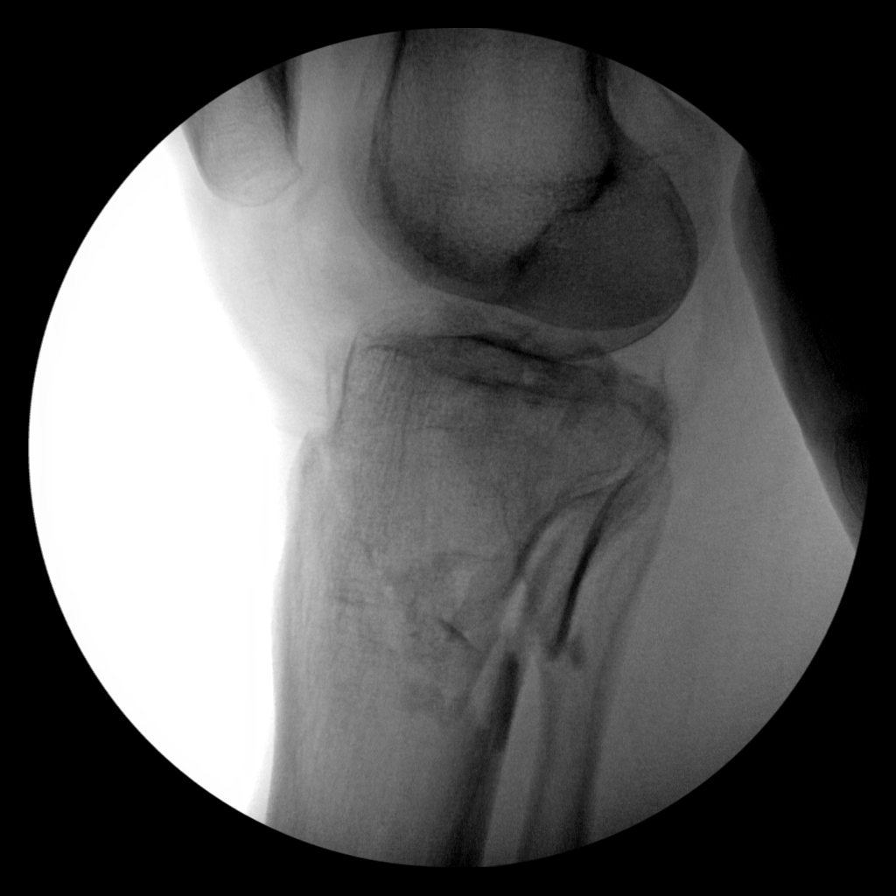
[im 3/7]
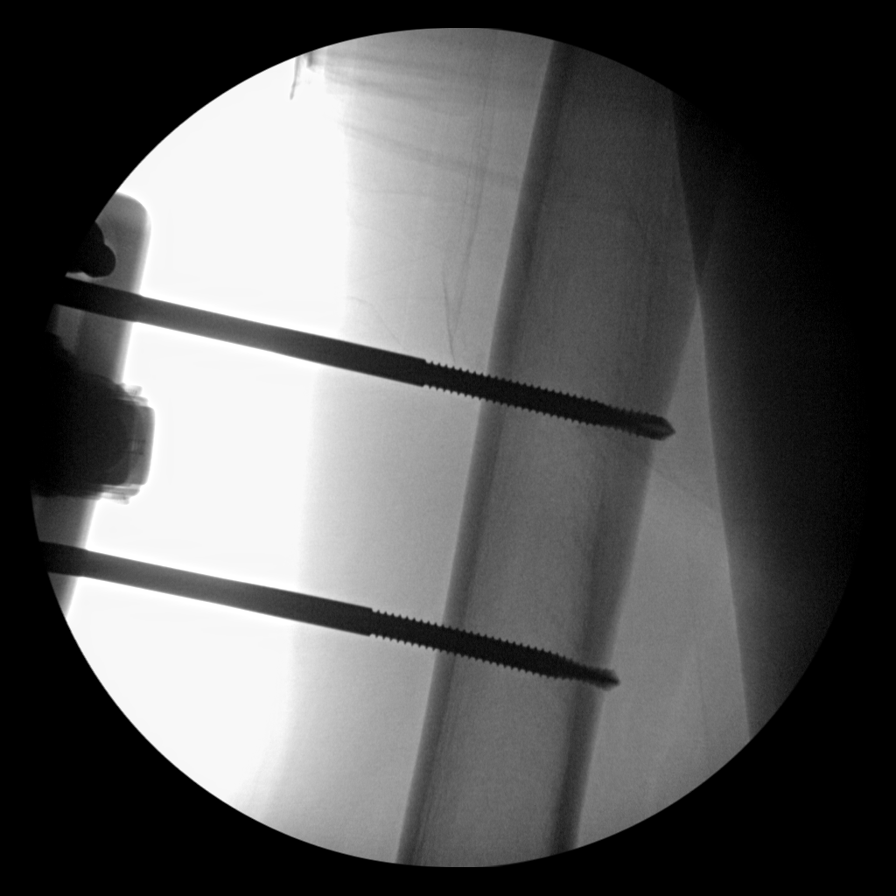
[im 4/7]
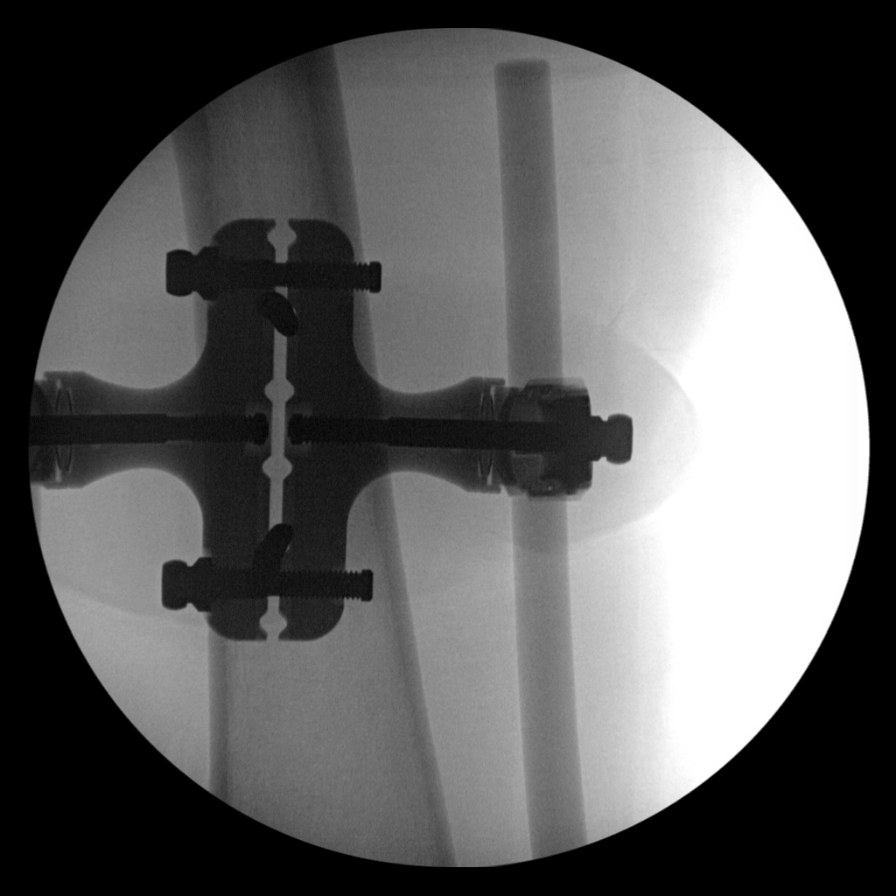
[im 5/7]
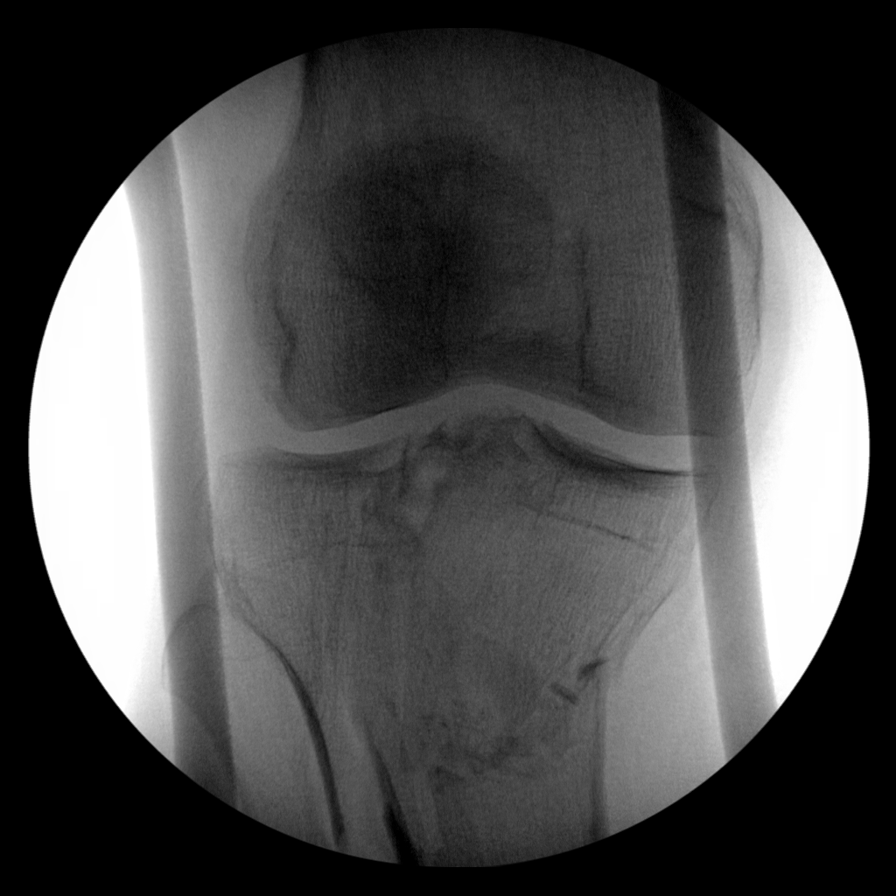
[im 6/7]
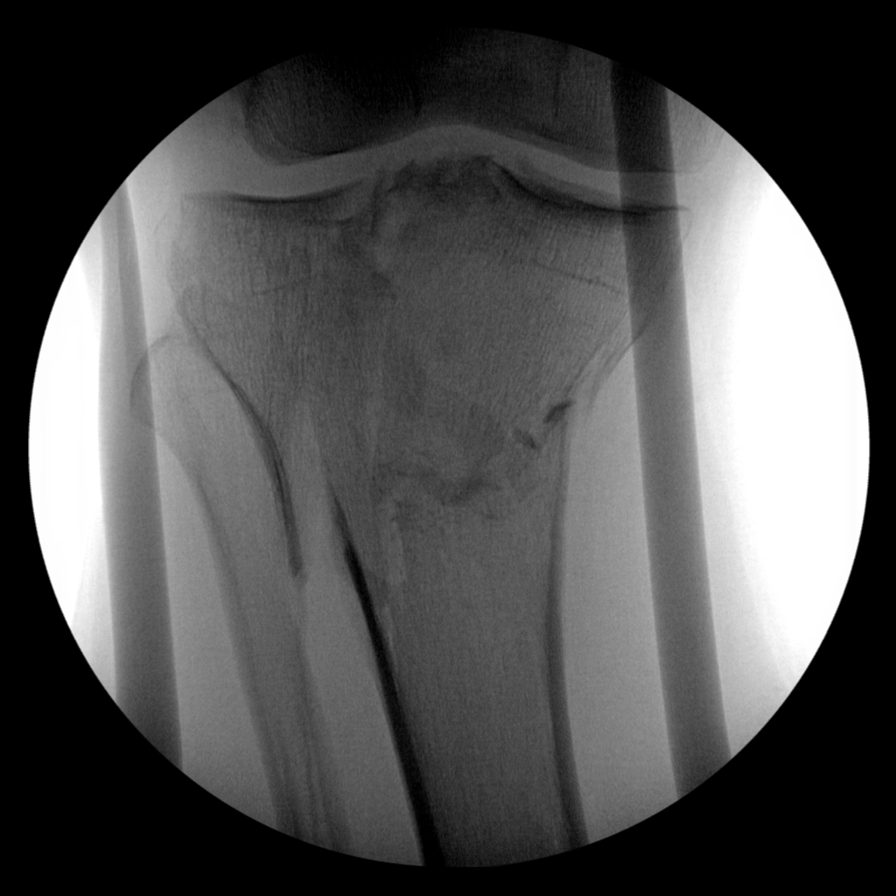
[im 7/7]
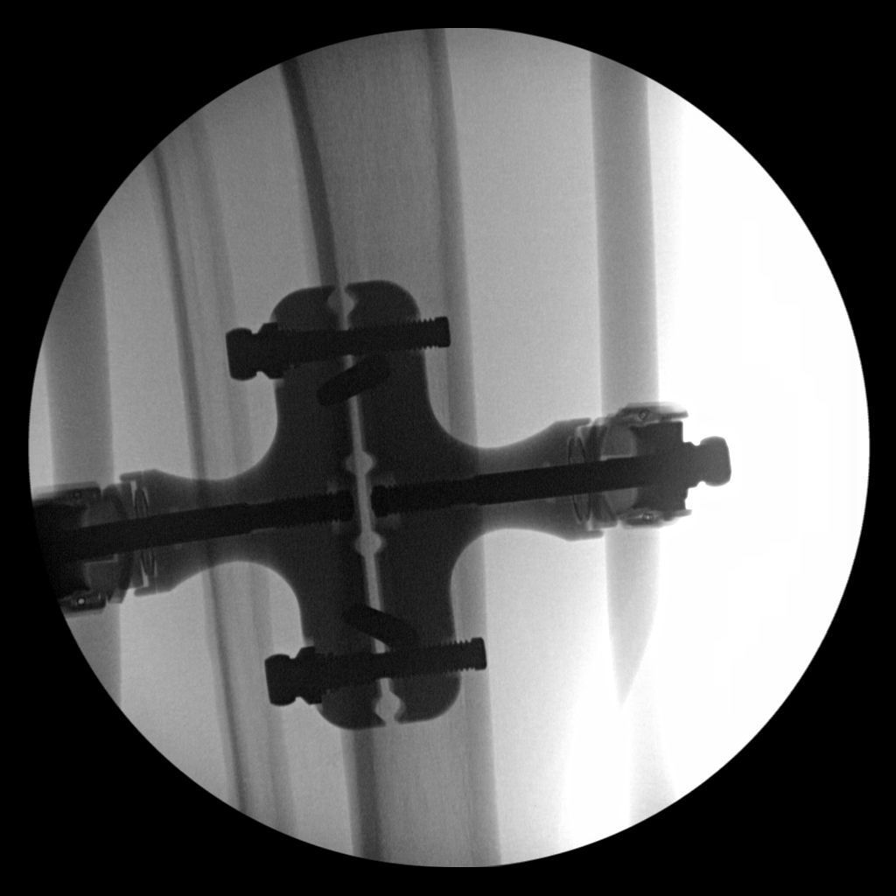

[7 of 7 positions shown; findings below may reference images not displayed]

FINDINGS: Seven spot fluoro graphic images show placement the external fixator
from the distal femoral shaft to the tibial shaft, supporting the
comminuted displaced fracture of the proximal tibia.
IMPRESSION: Portable fluoroscopic imaging provided for external fixator
placement, for a comminuted displaced proximal right tibia fracture.

## 2021-01-05 IMAGING — DX DG KNEE 3 VIEWS*R*
3 series · 3 of 3 positions shown · non-contrast
Comparison: None.

CLINICAL DATA: Right knee pain after fall.

EXAM:
RIGHT KNEE - 3 VIEW

[knee ap]
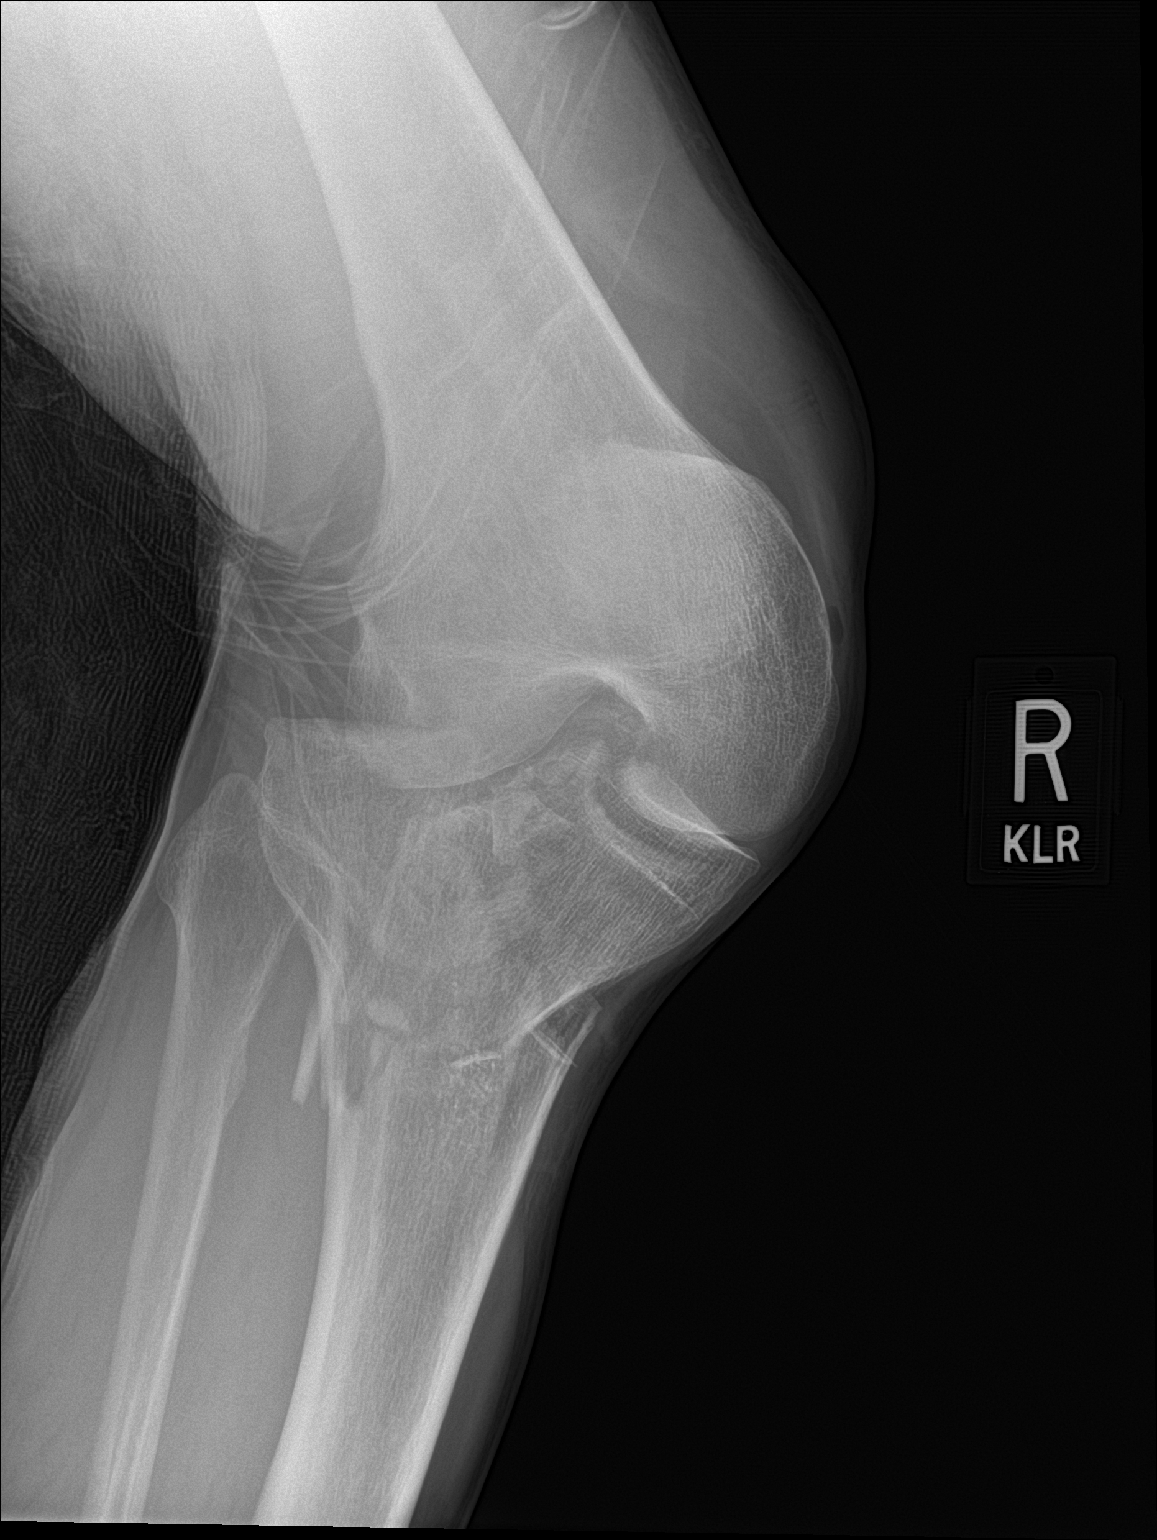

[knee lat]
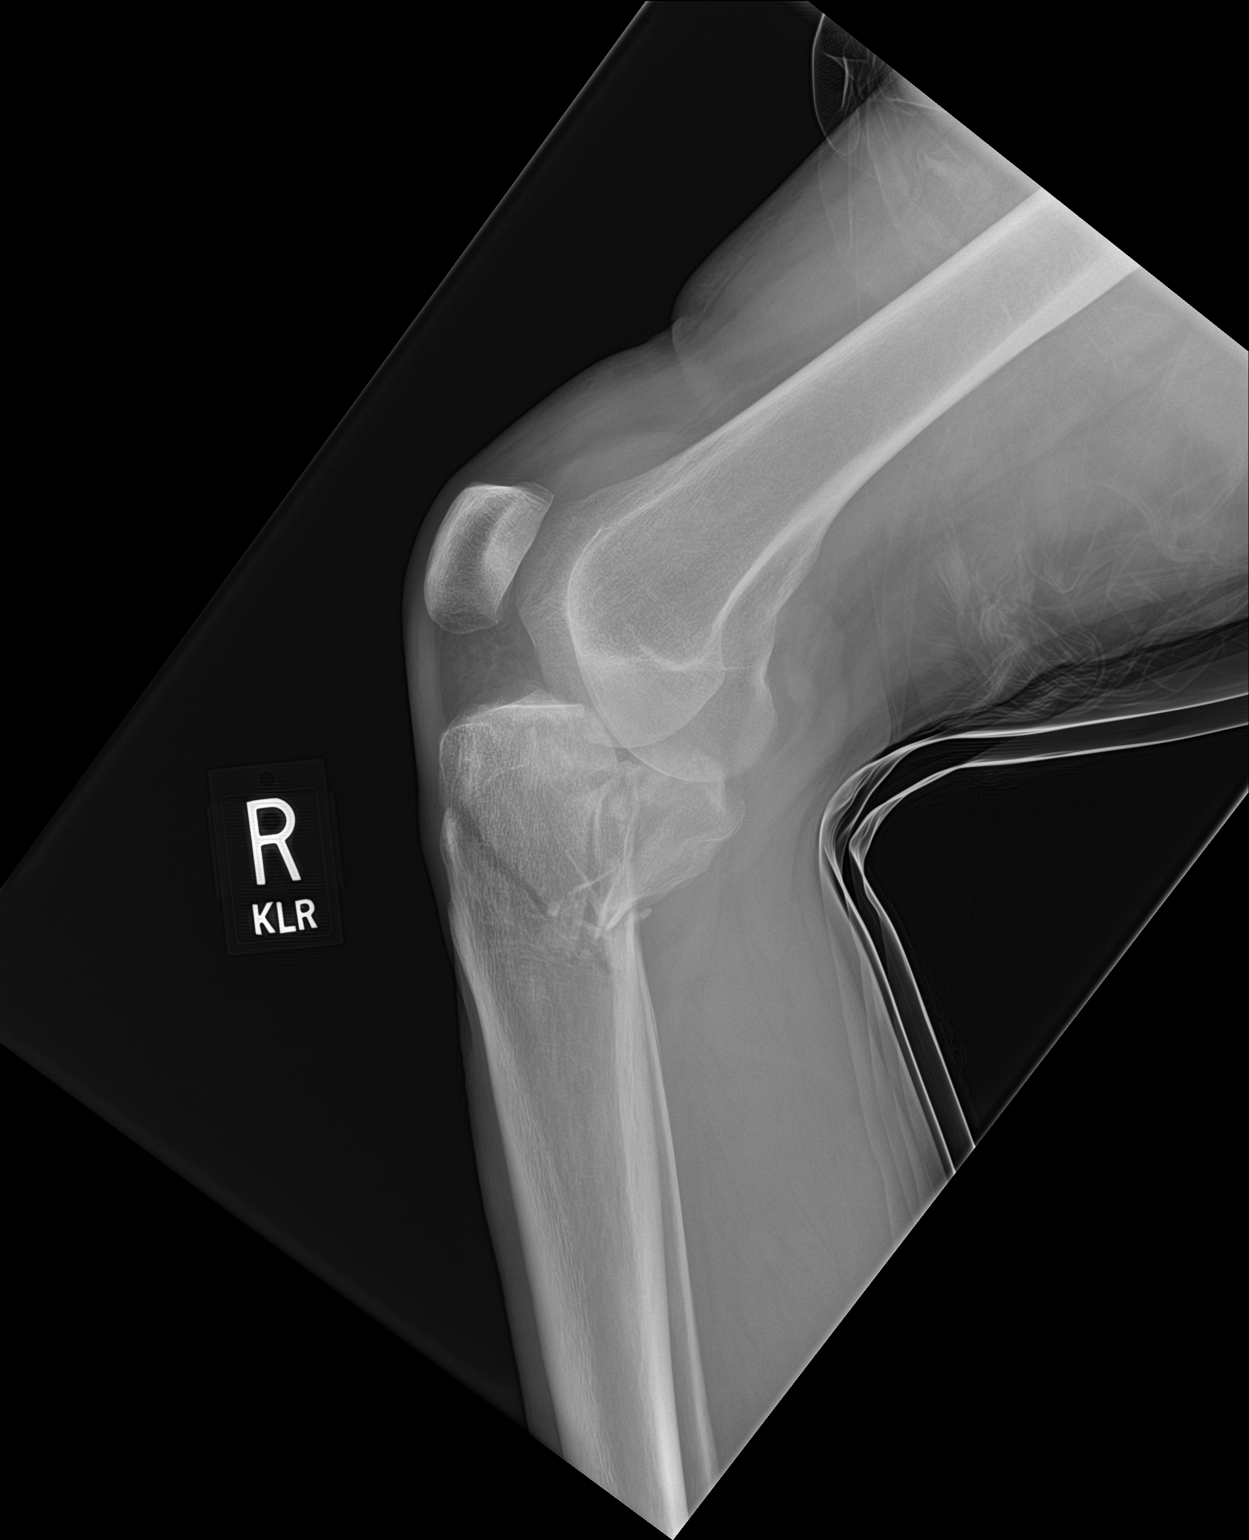

[knee obl]
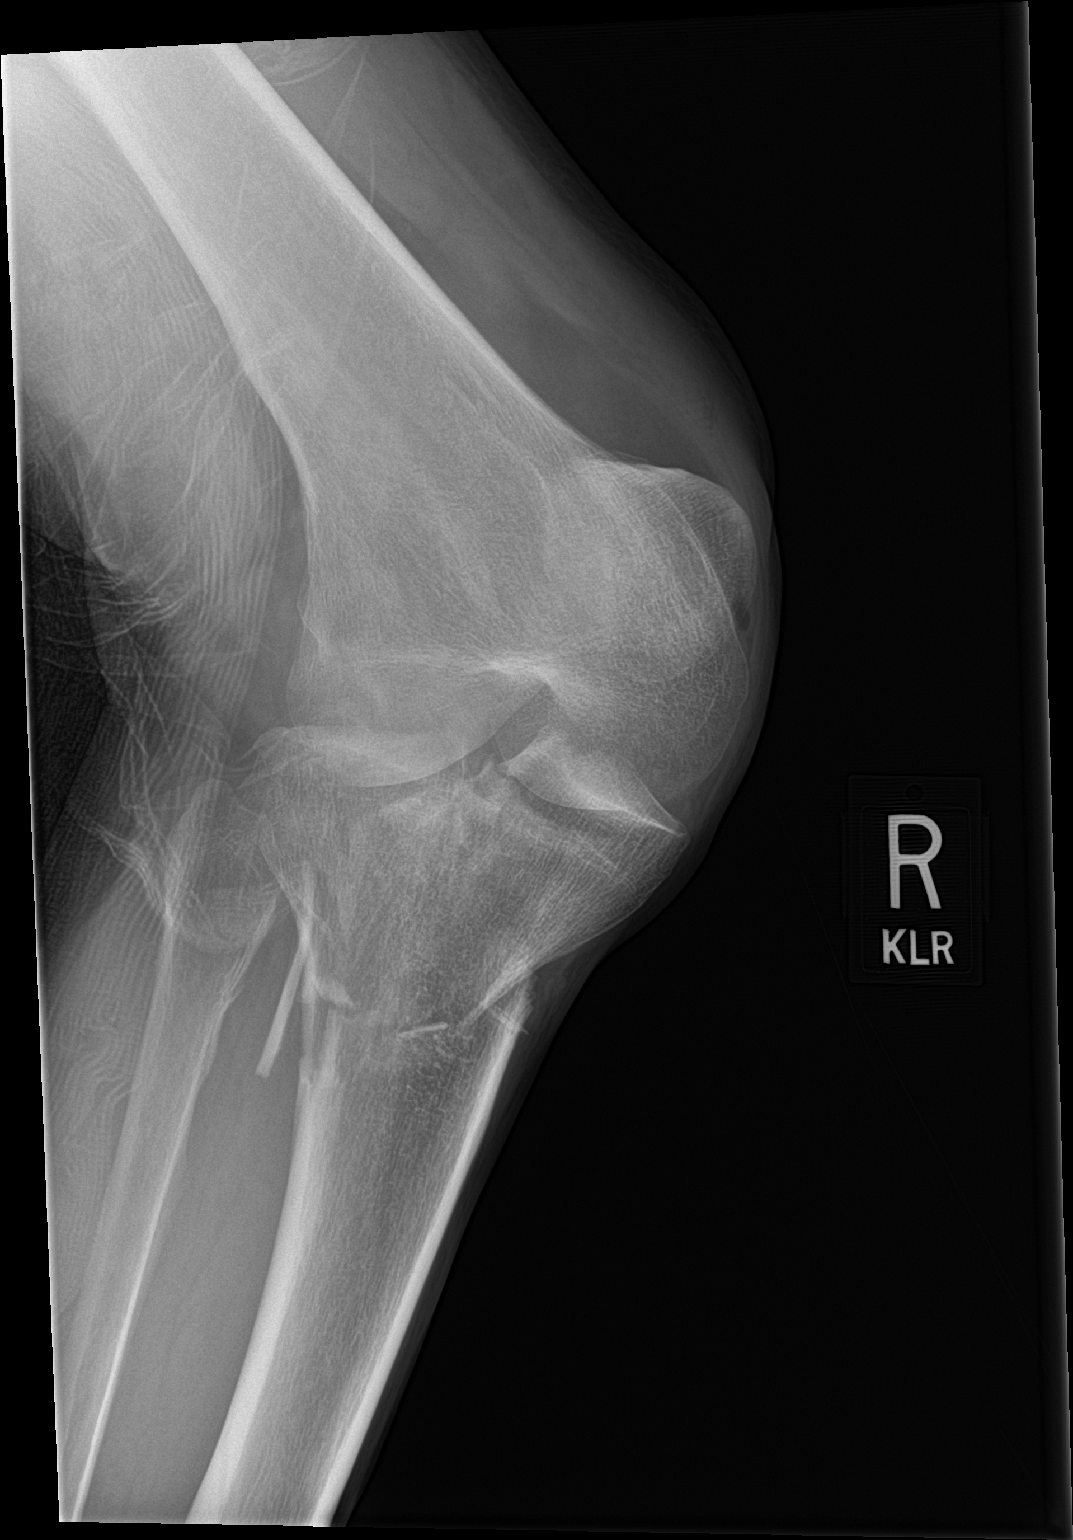

[3 of 3 positions shown; findings below may reference images not displayed]

FINDINGS: Severely comminuted and displaced fracture is seen involving the
medial portion of the tibial plateau. Anterior dislocation of the
tibia relative to distal femur is noted. Small suprapatellar joint
effusion is noted. Patella and distal femur are unremarkable.
IMPRESSION: Severely comminuted and displaced medial tibial plateau fracture,
with anterior dislocation of tibia relative to distal femur.

## 2021-01-07 IMAGING — CT CT OF THE RIGHT KNEE WITHOUT CONTRAST
3 series · 13 of 33 positions shown, 16 images · non-contrast
Comparison: CT right knee dated December 01, 2018.

CLINICAL DATA: Right tibial plateau fracture dislocation status
post reduction and external fixation.

EXAM:
CT OF THE RIGHT KNEE WITHOUT CONTRAST
TECHNIQUE: Multidetector CT imaging of the RIGHT knee was performed according
to the standard protocol. Multiplanar CT image reconstructions were
also generated.

[Series 4: lfov ext 3.0 b40s · axial · 0.35mm/px · z∈[-814,-652]mm · 5 of 79 slices shown, 7 images]
[im 13/79  soft-tissue]
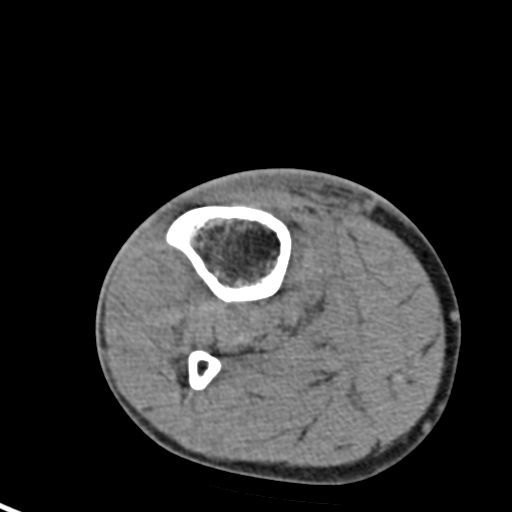
[im 13/79  bone]
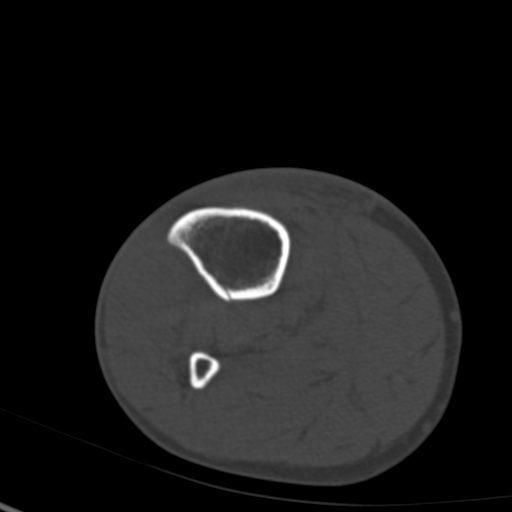
[im 25/79  bone]
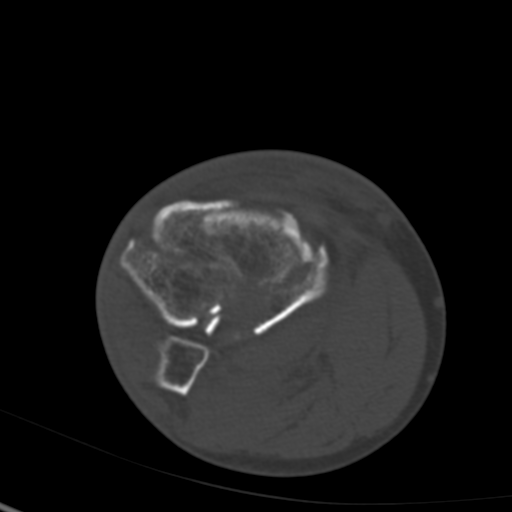
[im 43/79  bone]
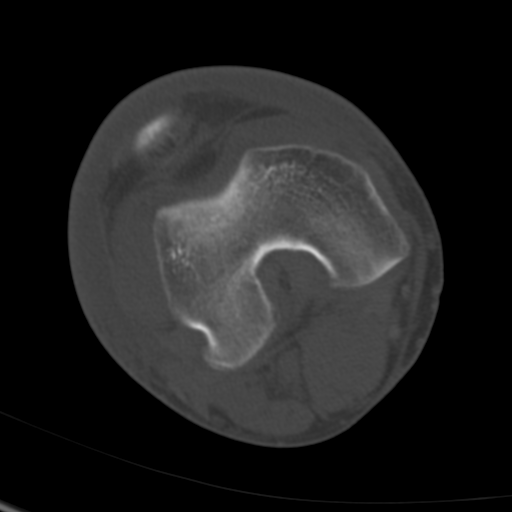
[im 55/79  bone]
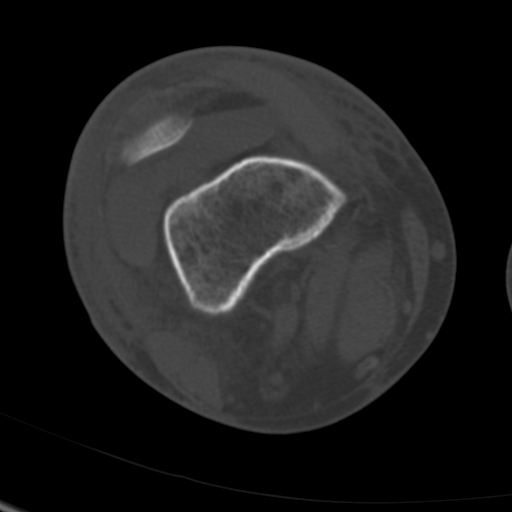
[im 67/79  soft-tissue]
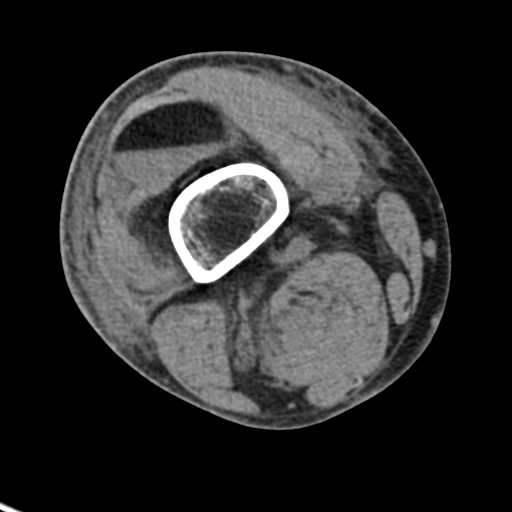
[im 67/79  bone]
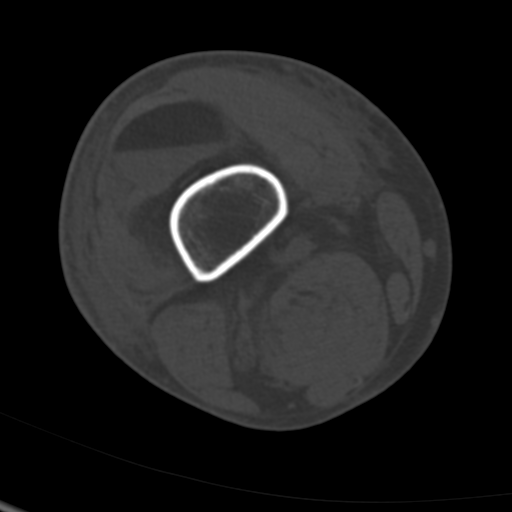

[Series 9: coronalsoft tissue · coronal · 0.33mm/px · 3 of 83 slices shown]
[im 17/83  bone]
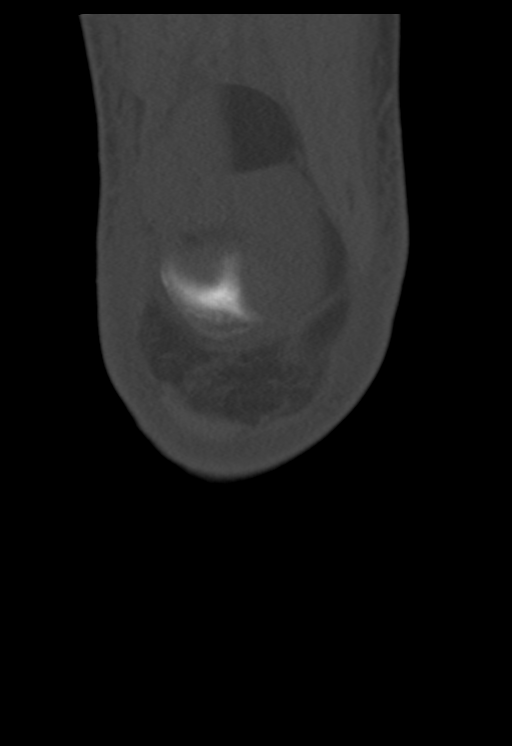
[im 33/83  bone]
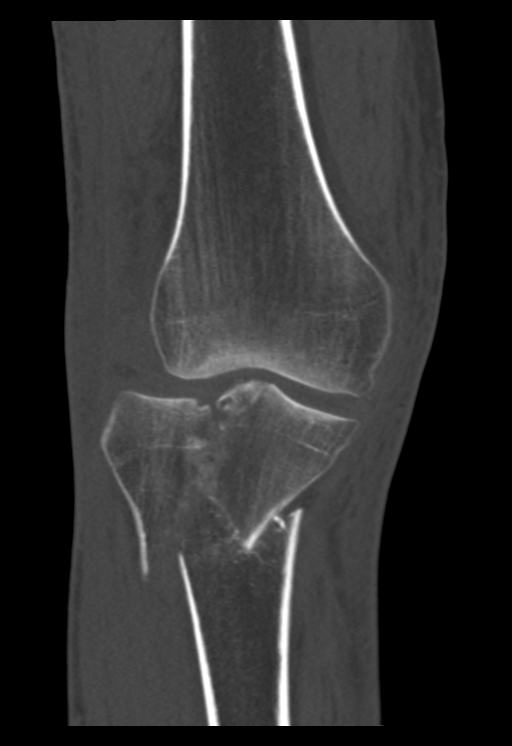
[im 50/83  bone]
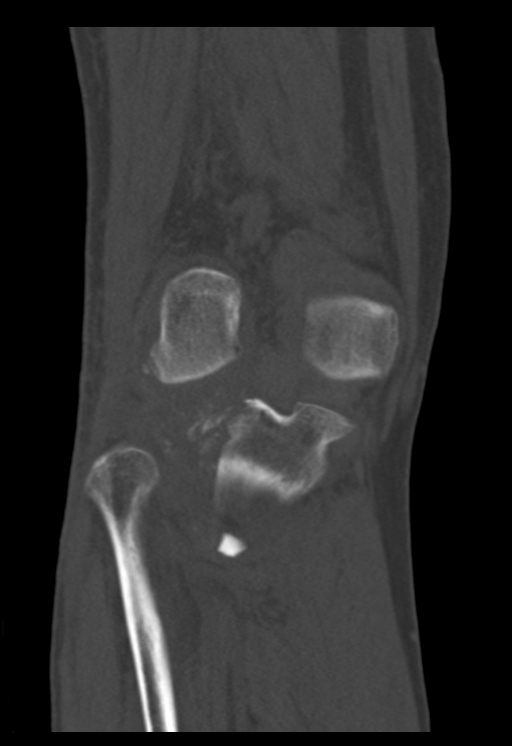

[Series 10: sagittalsoft tissue · sagittal · 0.37mm/px · 5 of 75 slices shown, 6 images]
[im 25/75  bone]
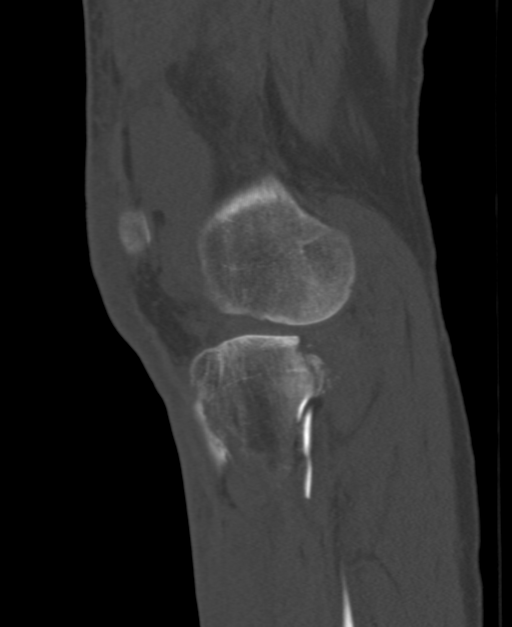
[im 31/75  bone]
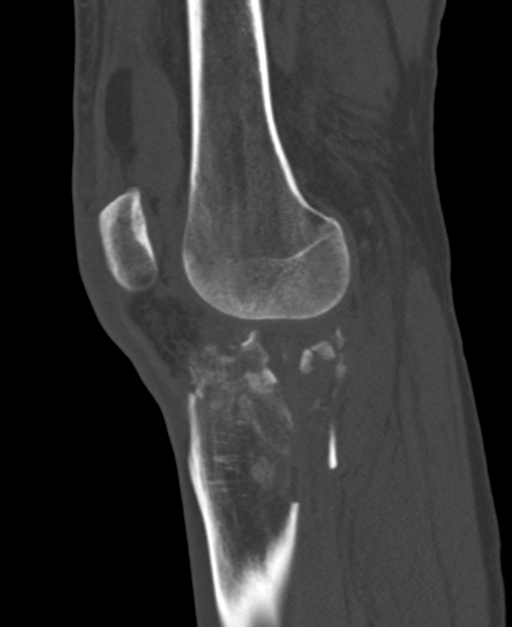
[im 38/75  soft-tissue]
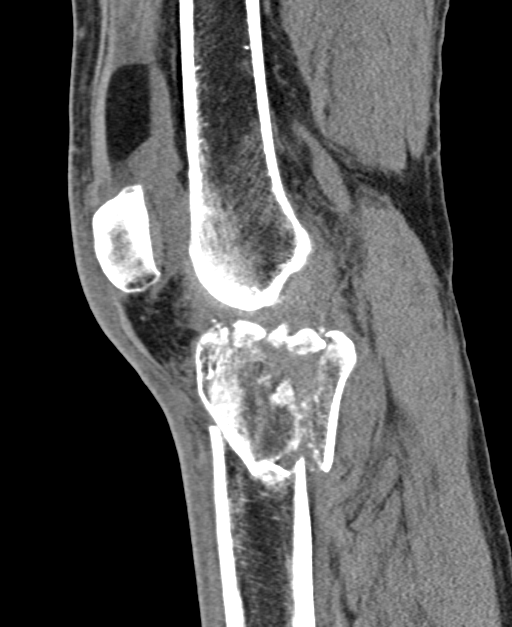
[im 38/75  bone]
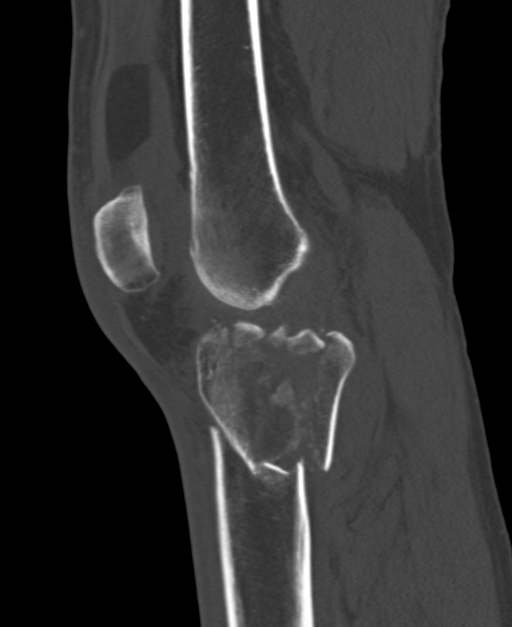
[im 44/75  bone]
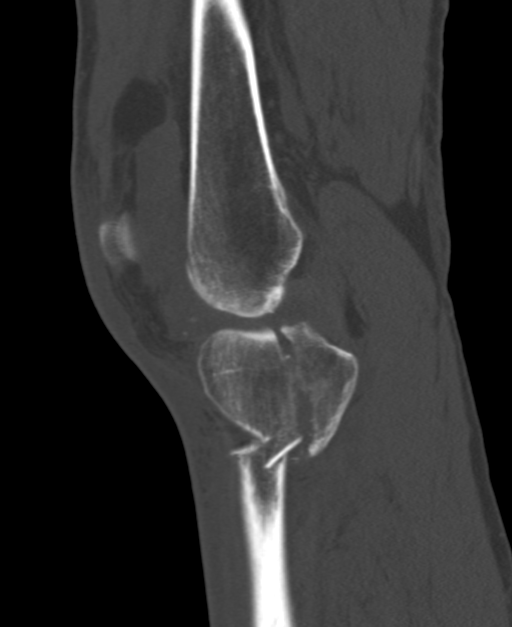
[im 50/75  bone]
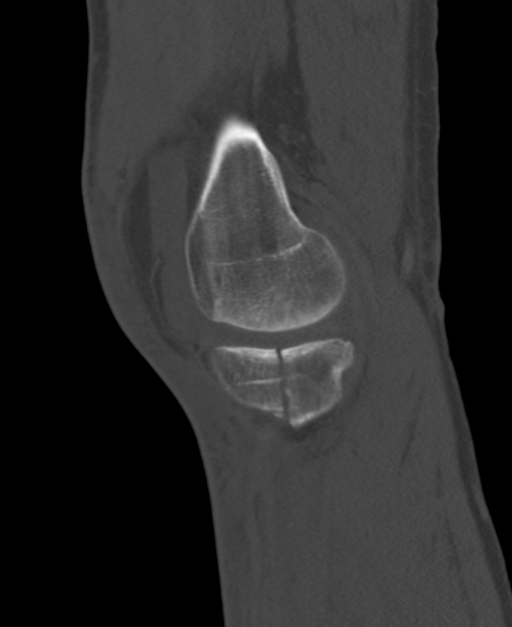

[13 of 33 positions shown; findings below may reference images not displayed]

FINDINGS: Bones/Joint/Cartilage

Again seen is a highly comminuted tibial plateau fracture with
significant involvement of the tibial spine and associated oblique
fracture of the proximal tibial metaphysis. The lateral tibial
plateau articular surface remains significantly depressed along its
medial aspect. There is no significant residual medial tibial
plateau articular surface incongruity.

Significantly improved alignment with resolution of the lateral
femoral condyle posterior subluxation. The dominant lateral tibial
plateau fragment remains laterally displaced by approximately 1.7 cm
with respect to the lateral femoral condyle. The medial tibial
plateau fragments are now slightly displaced posteriorly by
approximately 9 mm. There is unchanged 1.7 cm of impaction of the
anterior medial tibial plateau fragment.

Small avulsion fracture of the peripheral lateral tibial plateau,
better seen on today's study. Unchanged large lipohemarthrosis.

Ligaments

Suboptimally assessed by CT.

Muscles and Tendons

No muscle atrophy.  The extensor mechanism is intact.

Soft tissues

Moderate soft tissue swelling along the anterior knee. No fluid
collection or soft tissue mass.
IMPRESSION: 1. Highly comminuted bicondylar tibial plateau and proximal tibial
metaphyseal fracture with resolved posterior subluxation of the
lateral femoral condyle status post reduction and external fixation.
Persistent displacement of the dominant medial and lateral tibial
plateau fragments as described above.
2. Small avulsion fracture of the peripheral aspect of the lateral
tibial plateau (Segond fracture), better seen on today's study.

## 2021-01-10 IMAGING — RF DG C-ARM 61-120 MIN
1 series · 7 of 7 positions shown · non-contrast
Comparison: 12/01/2018.

CLINICAL DATA: ORIF.

EXAM:
DG C-ARM 61-120 MIN

[Series 1: run · 7 of 7 slices shown]
[im 1/7]
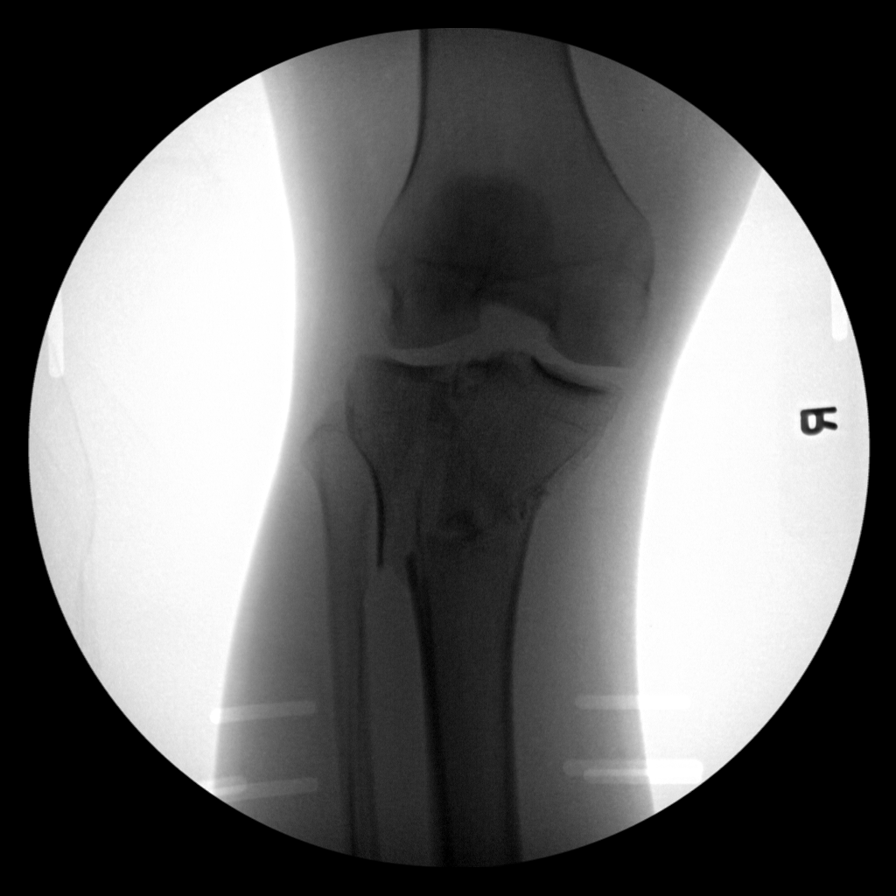
[im 2/7]
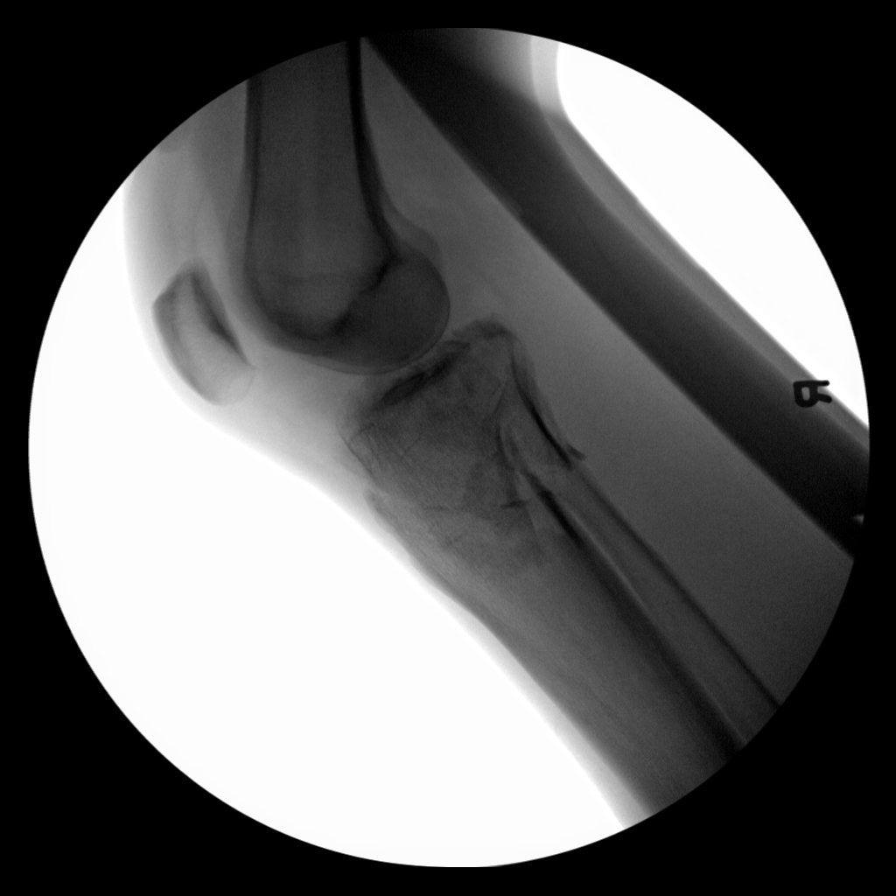
[im 3/7]
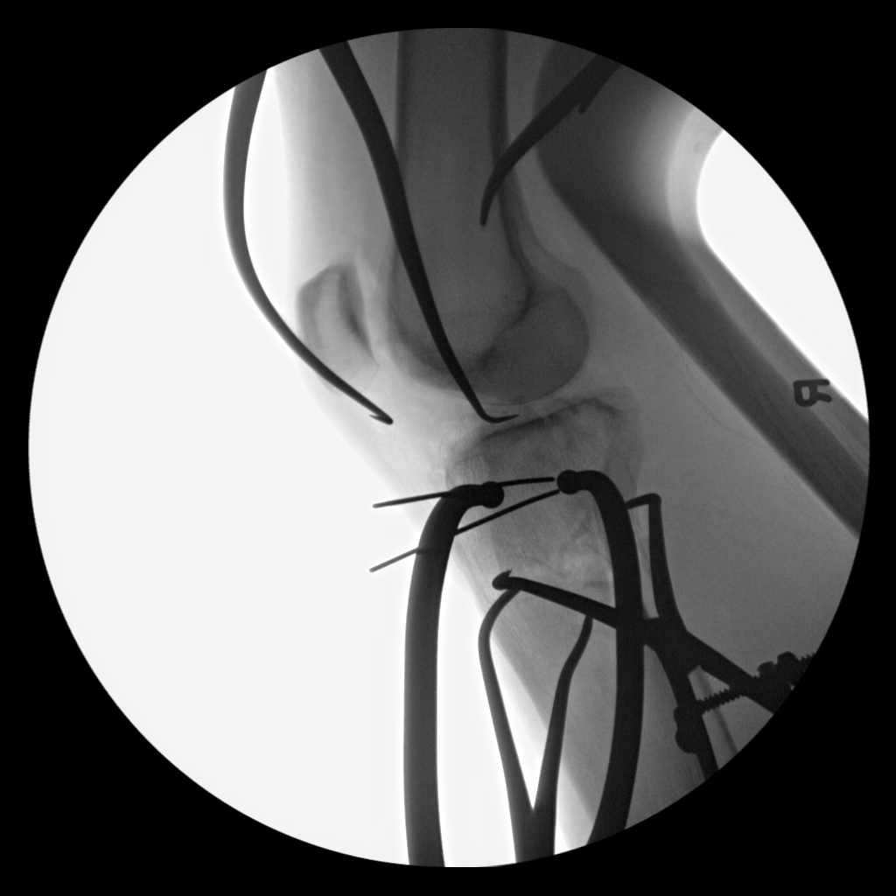
[im 4/7]
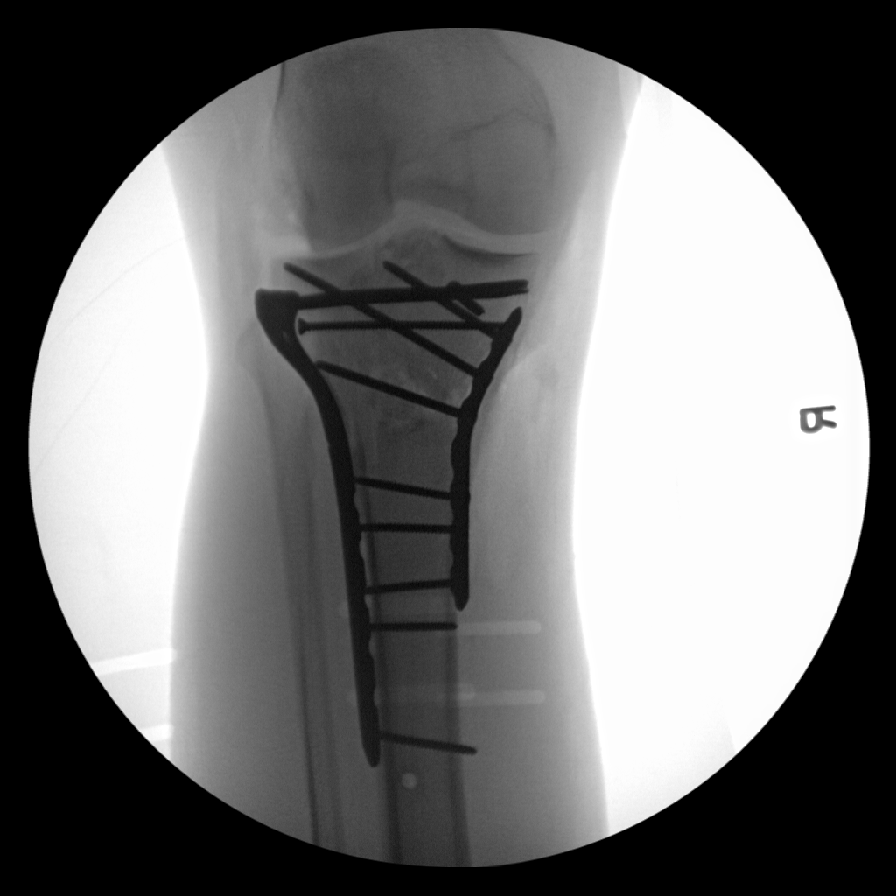
[im 5/7]
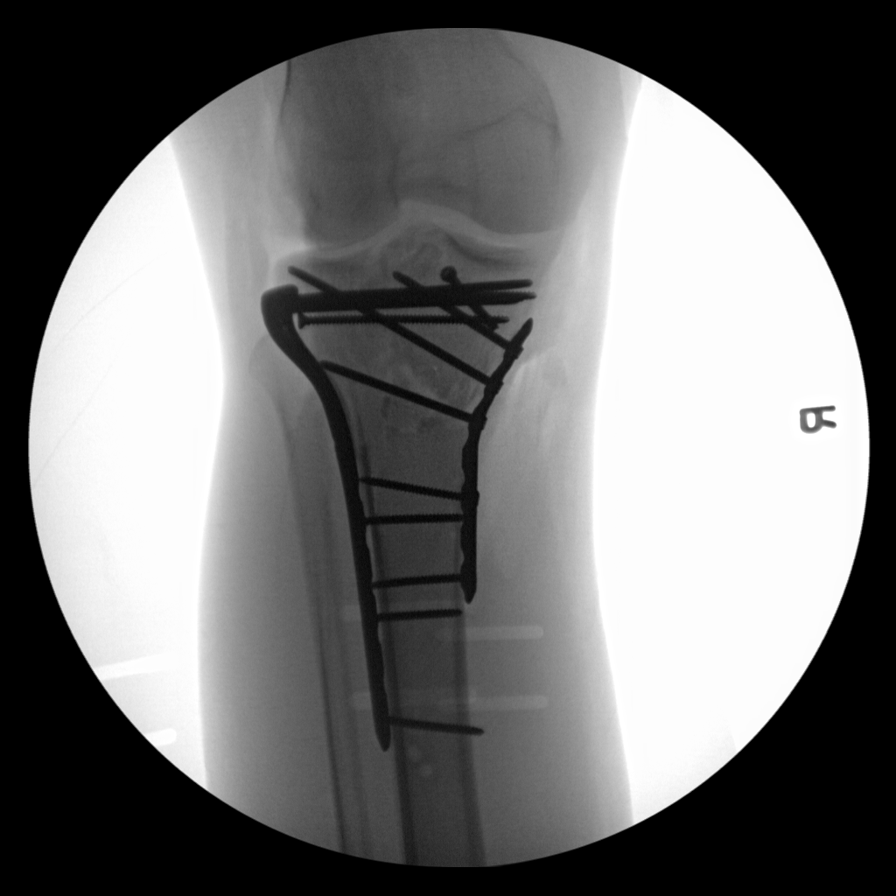
[im 6/7]
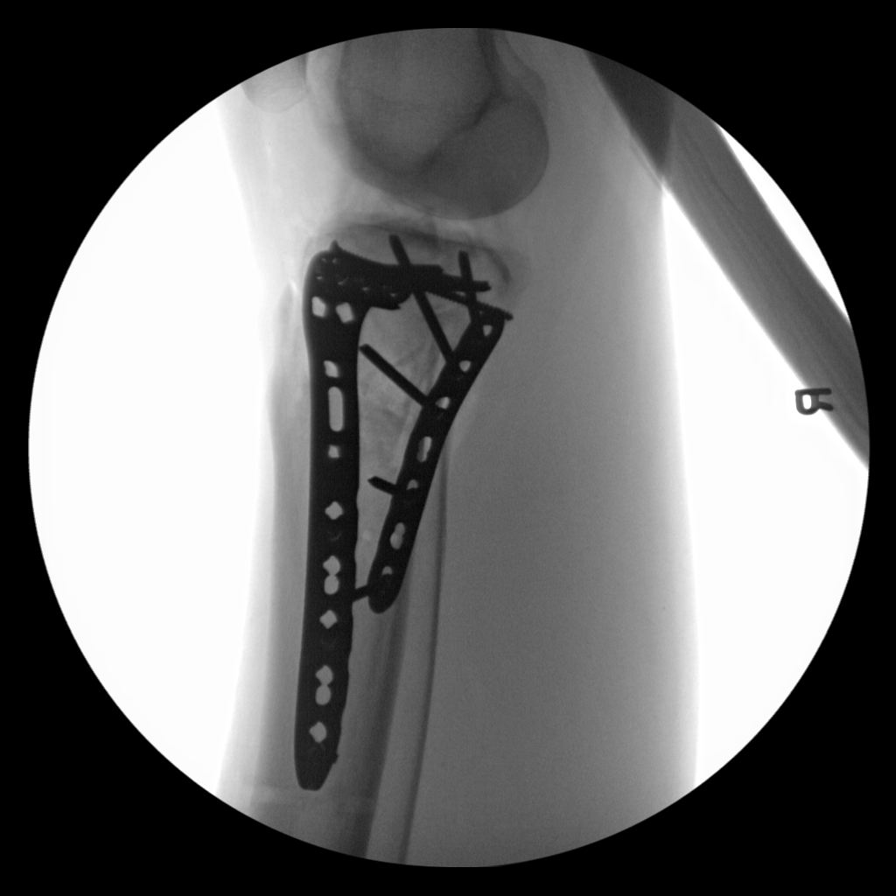
[im 7/7]
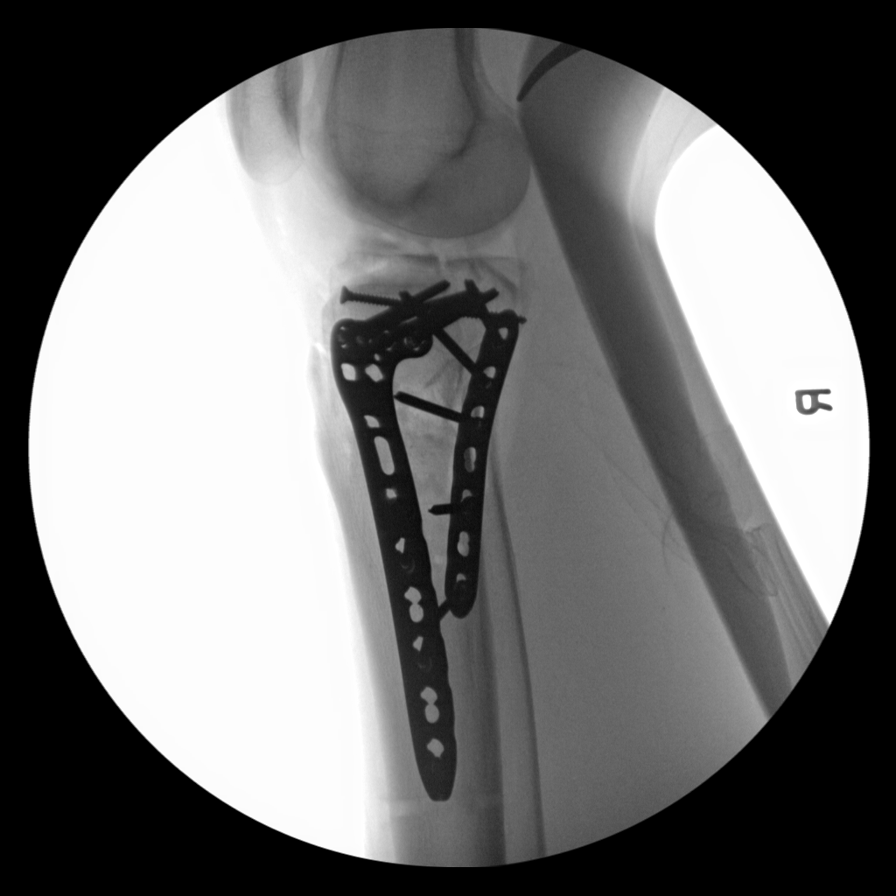

[7 of 7 positions shown; findings below may reference images not displayed]

FINDINGS: ORIF proximal right tibia fractures. Hardware intact. Anatomic
alignment. 5 minutes 25 seconds fluoroscopy time utilized. Seven
images obtained.
IMPRESSION: ORIF proximal right tibial fractures. Hardware intact. Anatomic
alignment.

## 2021-03-29 DIAGNOSIS — F331 Major depressive disorder, recurrent, moderate: Secondary | ICD-10-CM | POA: Diagnosis not present

## 2021-03-29 DIAGNOSIS — F064 Anxiety disorder due to known physiological condition: Secondary | ICD-10-CM | POA: Diagnosis not present

## 2021-03-29 DIAGNOSIS — G4701 Insomnia due to medical condition: Secondary | ICD-10-CM | POA: Diagnosis not present

## 2021-04-20 DIAGNOSIS — I509 Heart failure, unspecified: Secondary | ICD-10-CM | POA: Diagnosis not present

## 2021-04-20 DIAGNOSIS — K219 Gastro-esophageal reflux disease without esophagitis: Secondary | ICD-10-CM | POA: Diagnosis not present

## 2021-04-20 DIAGNOSIS — J449 Chronic obstructive pulmonary disease, unspecified: Secondary | ICD-10-CM | POA: Diagnosis not present

## 2021-04-23 DIAGNOSIS — R0789 Other chest pain: Secondary | ICD-10-CM | POA: Diagnosis not present

## 2021-04-26 DIAGNOSIS — G4701 Insomnia due to medical condition: Secondary | ICD-10-CM | POA: Diagnosis not present

## 2021-04-26 DIAGNOSIS — F064 Anxiety disorder due to known physiological condition: Secondary | ICD-10-CM | POA: Diagnosis not present

## 2021-04-26 DIAGNOSIS — F331 Major depressive disorder, recurrent, moderate: Secondary | ICD-10-CM | POA: Diagnosis not present

## 2021-04-27 ENCOUNTER — Encounter (HOSPITAL_COMMUNITY): Payer: Self-pay

## 2021-04-27 ENCOUNTER — Other Ambulatory Visit: Payer: Self-pay

## 2021-04-27 ENCOUNTER — Emergency Department (HOSPITAL_COMMUNITY)
Admission: EM | Admit: 2021-04-27 | Discharge: 2021-04-27 | Disposition: A | Discharge status: Home / Self Care | Payer: Medicare HMO | Attending: Emergency Medicine | Admitting: Emergency Medicine

## 2021-04-27 ENCOUNTER — Emergency Department (HOSPITAL_COMMUNITY): Payer: Medicare HMO

## 2021-04-27 DIAGNOSIS — H538 Other visual disturbances: Secondary | ICD-10-CM | POA: Insufficient documentation

## 2021-04-27 DIAGNOSIS — G4489 Other headache syndrome: Secondary | ICD-10-CM | POA: Diagnosis not present

## 2021-04-27 DIAGNOSIS — J449 Chronic obstructive pulmonary disease, unspecified: Secondary | ICD-10-CM | POA: Insufficient documentation

## 2021-04-27 DIAGNOSIS — I509 Heart failure, unspecified: Secondary | ICD-10-CM | POA: Insufficient documentation

## 2021-04-27 DIAGNOSIS — F17290 Nicotine dependence, other tobacco product, uncomplicated: Secondary | ICD-10-CM | POA: Insufficient documentation

## 2021-04-27 DIAGNOSIS — R110 Nausea: Secondary | ICD-10-CM | POA: Insufficient documentation

## 2021-04-27 DIAGNOSIS — Z79899 Other long term (current) drug therapy: Secondary | ICD-10-CM | POA: Diagnosis not present

## 2021-04-27 DIAGNOSIS — J45909 Unspecified asthma, uncomplicated: Secondary | ICD-10-CM | POA: Insufficient documentation

## 2021-04-27 DIAGNOSIS — R0789 Other chest pain: Secondary | ICD-10-CM | POA: Diagnosis not present

## 2021-04-27 DIAGNOSIS — M79601 Pain in right arm: Secondary | ICD-10-CM | POA: Diagnosis not present

## 2021-04-27 DIAGNOSIS — Z7951 Long term (current) use of inhaled steroids: Secondary | ICD-10-CM | POA: Insufficient documentation

## 2021-04-27 DIAGNOSIS — R0902 Hypoxemia: Secondary | ICD-10-CM | POA: Diagnosis not present

## 2021-04-27 DIAGNOSIS — I110 Hypertensive heart disease with heart failure: Secondary | ICD-10-CM | POA: Insufficient documentation

## 2021-04-27 DIAGNOSIS — R079 Chest pain, unspecified: Secondary | ICD-10-CM | POA: Diagnosis not present

## 2021-04-27 DIAGNOSIS — R197 Diarrhea, unspecified: Secondary | ICD-10-CM | POA: Diagnosis not present

## 2021-04-27 LAB — COMPREHENSIVE METABOLIC PANEL
ALT: 47 U/L — ABNORMAL HIGH (ref 0–44)
AST: 73 U/L — ABNORMAL HIGH (ref 15–41)
Albumin: 4.2 g/dL (ref 3.5–5.0)
Alkaline Phosphatase: 92 U/L (ref 38–126)
Anion gap: 6 (ref 5–15)
BUN: 7 mg/dL (ref 6–20)
CO2: 28 mmol/L (ref 22–32)
Calcium: 8.4 mg/dL — ABNORMAL LOW (ref 8.9–10.3)
Chloride: 102 mmol/L (ref 98–111)
Creatinine, Ser: 0.63 mg/dL (ref 0.61–1.24)
GFR, Estimated: >60 mL/min (ref >60)
Glucose, Bld: 96 mg/dL (ref 70–99)
Potassium: 3.2 mmol/L — ABNORMAL LOW (ref 3.5–5.1)
Sodium: 136 mmol/L (ref 135–145)
Total Bilirubin: 0.6 mg/dL (ref 0.3–1.2)
Total Protein: 6.5 g/dL (ref 6.5–8.1)

## 2021-04-27 LAB — TROPONIN I (HIGH SENSITIVITY)
Troponin I (High Sensitivity): 8 ng/L (ref <18)
Troponin I (High Sensitivity): 9 ng/L (ref <18)

## 2021-04-27 LAB — MAGNESIUM: Magnesium: 1.6 mg/dL — ABNORMAL LOW (ref 1.7–2.4)

## 2021-04-27 LAB — BRAIN NATRIURETIC PEPTIDE: B Natriuretic Peptide: 26 pg/mL (ref 0.0–100.0)

## 2021-04-27 LAB — CBC WITH DIFFERENTIAL/PLATELET
Abs Immature Granulocytes: 0.01 10*3/uL (ref 0.00–0.07)
Basophils Absolute: 0.1 10*3/uL (ref 0.0–0.1)
Basophils Relative: 1 %
Eosinophils Absolute: 0.1 10*3/uL (ref 0.0–0.5)
Eosinophils Relative: 3 %
HCT: 40.1 % (ref 39.0–52.0)
Hemoglobin: 14 g/dL (ref 13.0–17.0)
Immature Granulocytes: 0 %
Lymphocytes Relative: 30 %
Lymphs Abs: 1.2 10*3/uL (ref 0.7–4.0)
MCH: 32.6 pg (ref 26.0–34.0)
MCHC: 34.9 g/dL (ref 30.0–36.0)
MCV: 93.3 fL (ref 80.0–100.0)
Monocytes Absolute: 0.4 10*3/uL (ref 0.1–1.0)
Monocytes Relative: 10 %
Neutro Abs: 2.2 10*3/uL (ref 1.7–7.7)
Neutrophils Relative %: 56 %
Platelets: 176 10*3/uL (ref 150–400)
RBC: 4.3 MIL/uL (ref 4.22–5.81)
RDW: 13.9 % (ref 11.5–15.5)
WBC: 4 10*3/uL (ref 4.0–10.5)
nRBC: 0 % (ref 0.0–0.2)

## 2021-04-27 LAB — PROTIME-INR
INR: 1 (ref 0.8–1.2)
Prothrombin Time: 13.6 seconds (ref 11.4–15.2)

## 2021-04-27 LAB — CBG MONITORING, ED: Glucose-Capillary: 111 mg/dL — ABNORMAL HIGH (ref 70–99)

## 2021-04-27 MED ORDER — ONDANSETRON HCL 4 MG/2ML IJ SOLN
4.0000 mg | Freq: Once | INTRAMUSCULAR | Status: AC
  Administered 2021-04-27: 4 mg via INTRAVENOUS
  Filled 2021-04-27: qty 2

## 2021-04-27 MED ORDER — LOSARTAN POTASSIUM 25 MG PO TABS
25.0000 mg | ORAL_TABLET | Freq: Once | ORAL | Status: AC
  Administered 2021-04-27: 25 mg via ORAL
  Filled 2021-04-27: qty 1

## 2021-04-27 MED ORDER — HYDROMORPHONE HCL 2 MG/ML IJ SOLN
2.0000 mg | Freq: Once | INTRAMUSCULAR | Status: AC
  Administered 2021-04-27: 2 mg via INTRAVENOUS
  Filled 2021-04-27: qty 1

## 2021-04-27 MED ORDER — HYDROMORPHONE HCL 1 MG/ML IJ SOLN
1.0000 mg | Freq: Once | INTRAMUSCULAR | Status: AC
  Administered 2021-04-27: 1 mg via INTRAVENOUS
  Filled 2021-04-27: qty 1

## 2021-04-27 MED ORDER — ONDANSETRON HCL 4 MG/2ML IJ SOLN
4.0000 mg | Freq: Once | INTRAMUSCULAR | Status: AC
Start: 2021-04-27 — End: 2021-04-27
  Administered 2021-04-27: 4 mg via INTRAVENOUS
  Filled 2021-04-27: qty 2

## 2021-04-27 MED ORDER — SUCRALFATE 1 GM/10ML PO SUSP
1.0000 g | Freq: Once | ORAL | Status: AC
  Administered 2021-04-27: 1 g via ORAL
  Filled 2021-04-27: qty 10

## 2021-04-27 MED ORDER — METHOCARBAMOL 500 MG PO TABS
750.0000 mg | ORAL_TABLET | Freq: Once | ORAL | Status: AC
  Administered 2021-04-27: 750 mg via ORAL
  Filled 2021-04-27: qty 2

## 2021-04-27 MED ORDER — ASPIRIN 81 MG PO CHEW
324.0000 mg | CHEWABLE_TABLET | Freq: Once | ORAL | Status: DC
  Filled 2021-04-27: qty 4

## 2021-04-27 NOTE — ED Notes (Signed)
Spoke with shift lead (michael)

## 2021-04-27 NOTE — Discharge Instructions (Addendum)
As discussed, your evaluation today has been largely reassuring.  But, it is important that you monitor your condition carefully, and do not hesitate to return to the ED if you develop new, or concerning changes in your condition. ? ?Otherwise, please follow-up with your physician for appropriate ongoing care. ? ?

## 2021-04-27 NOTE — ED Notes (Signed)
EDP at bedside  

## 2021-04-27 NOTE — ED Provider Notes (Signed)
[facility] EMERGENCY DEPARTMENT Provider Note   CSN: [id number] Arrival date & time: 04/27/21  1532     History Chief Complaint  Patient presents with  . Chest Pain    [name] is a 45 y.o. male.  HPI Patient presents from group living facility with concern for chest pain which is now resolved, as well as right arm pain, nausea, transient vision changes.  Initial call was for all complaints except for chest pain.  Chest pain developed while the patient was being transported, but has resolved after receiving nitroglycerin.  Patient currently states that he feels fine, has no focal complaints, including current vision changes. He notes multiple medical issues including pulmonary disease, hypertension, and chronic pain requiring regular narcotic dosing.  He recently had a discrepancy with the provision of his narcotics, and this seems to have created some discord, as well as worsening pain of his right knee which is where his pain is typical, unchanged, but currently severe.    Past Medical History:  Diagnosis Date  . Anxiety   . Arthritis    knees and fingers  . Asthma   . CHF (congestive heart failure) (HCC)   . COPD (chronic obstructive pulmonary disease) (HCC)   . Depression   . Encephalopathy   . GERD (gastroesophageal reflux disease)   . Hypertension   . Hyponatremia 06/2016  . Pneumonia 2017  . Renal disorder    kidney injury  . Seizures [facility](HCC)     Patient Active Problem List   Diagnosis Date Noted  . Closed fracture of right proximal tibia 11/05/2019  . Closed displaced bicondylar fracture of right tibia 12/03/2018  . Cellulitis of left lower extremity   . Cellulitis of left foot 03/07/2018  . COPD (chronic obstructive pulmonary disease) (HCC) 05/03/2017  . Hyponatremia 06/27/2016  . GERD (gastroesophageal reflux disease) 11/11/2015  . Borderline personality disorder (HCC) 11/11/2015  . Alcohol use disorder, severe, in controlled environment (HCC) 11/11/2015   . 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 Tobacco abuse 11/05/2013    Past Surgical History:  Procedure Laterality Date  . COLONOSCOPY    . ESOPHAGOGASTRODUODENOSCOPY    . EXTERNAL FIXATION LEG Right 12/01/2018   Procedure: EXTERNAL FIXATION LEG;  Surgeon: Juanell FairlyKrasinski, Kevin, MD;  Location: [facility] ORS;  Service: Orthopedics;  Laterality: Right;  . EXTERNAL FIXATION REMOVAL Right 12/06/2018   Procedure: REMOVAL EXTERNAL FIXATION LEG;  Surgeon: Roby LoftsHaddix, Kevin P, MD;  Location: [facility] OR;  Service: Orthopedics;  Laterality: Right;  . FINGER SURGERY Left    5th digit-Fracture-"worse now than before"  . HARDWARE REMOVAL Right 12/06/2019   Procedure: HARDWARE REMOVAL RIGHT TIBIAL PLATEAU;  Surgeon: Roby LoftsHaddix, Kevin P, MD;  Location: [facility] OR;  Service: Orthopedics;  Laterality: Right;  . ORIF TIBIA PLATEAU Right 12/06/2018   Procedure: OPEN REDUCTION INTERNAL FIXATION (ORIF) TIBIAL PLATEAU;  Surgeon: Roby LoftsHaddix, Kevin P, MD;  Location: [facility] OR;  Service: Orthopedics;  Laterality: Right;  . PLEURAL EFFUSION DRAINAGE Right 01/02/2016   Procedure: DRAINAGE OF PLEURAL EFFUSION;  Surgeon: Loreli SlotSteven C Hendrickson, MD;  [facility];  Service: Thoracic;  Laterality: Right;  Marland Kitchen. VIDEO ASSISTED THORACOSCOPY (VATS)/DECORTICATION Right 01/02/2016   Procedure: VIDEO ASSISTED THORACOSCOPY (VATS)/DECORTICATION;  Surgeon: Loreli SlotSteven C Hendrickson, MD;  [facility];  Service: Thoracic;  Laterality: Right;  Marland Kitchen. VIDEO BRONCHOSCOPY N/A 01/02/2016   Procedure: VIDEO BRONCHOSCOPY;  Surgeon: Loreli SlotSteven C Hendrickson, MD;  [facility];  Service:  Thoracic;  Laterality: N/A;       Family History  Problem Relation Age of Onset  . CAD Mother   . Diabetes Mellitus II Father   . Prostate cancer Paternal Grandfather     Social History   Tobacco Use  . Smoking status: Current Every Day Smoker    Packs/day: 0.50    Years: 10.00    Pack  years: 5.00    Types: Pipe  . 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 Vaping Use: Never used  Substance Use Topics  . Alcohol use: No    Comment: pt states he no longer drinks 2013  . Drug use: No    Home Medications Prior to Admission medications   Medication Sig Start Date End Date Taking? Authorizing Provider  acetaminophen (TYLENOL) 500 MG tablet Take 2 tablets (1,000 mg total) by mouth every 8 (eight) hours as needed for mild pain, moderate pain, fever or headache. 07/29/20 07/29/21  Nita Sickle, MD  busPIRone (BUSPAR) 5 MG tablet Take 5 mg by mouth 2 (two) times daily.    [provider]  Cholecalciferol (VITAMIN D3) 2000 units TABS Take 2,000 Units by mouth daily.     [provider]  cyclobenzaprine (FLEXERIL) 10 MG tablet Take 10 mg by mouth 3 (three) times daily.    [provider]  DEXTROMETHORPHAN HBR PO Take 1 capsule by mouth every 3 (three) hours as needed (cough).    [provider]  diclofenac Sodium (VOLTAREN) 1 % GEL Apply 2 g topically See admin instructions. Apply 2 g transdermally every shift for pain to right knee    [provider]  divalproex (DEPAKOTE ER) 500 MG 24 hr tablet Take 4 tablets (2,000 mg total) by mouth at bedtime. 11/13/15   Jimmy Footman, MD  enalapril (VASOTEC) 10 MG tablet Take 10 mg by mouth daily.    [provider]  escitalopram (LEXAPRO) 20 MG tablet Take 20 mg by mouth daily.     [provider]  esomeprazole (NEXIUM) 40 MG capsule Take 40 mg by mouth daily.     [provider]  ferrous sulfate 325 (65 FE) MG tablet Take 1 tablet (325 mg total) by mouth 2 (two) times daily with a meal. 01/11/16   Regalado, Belkys A, MD  furosemide (LASIX) 20 MG tablet Take 1 tablet (20 mg total) by mouth daily. 03/08/18   Vassie Loll, MD  gabapentin (NEURONTIN) 100 MG capsule Take 100 mg by mouth 3 (three) times daily.     [provider]   guaiFENesin (MUCINEX) 600 MG 12 hr tablet Take 600 mg by mouth 2 (two) times daily.    [provider]  hydrocortisone cream 1 % Apply 1 application topically every 8 (eight) hours as needed for itching.    [provider]  Ipratropium-Albuterol (COMBIVENT RESPIMAT) 20-100 MCG/ACT AERS respimat Inhale 2 puffs into the lungs every 6 (six) hours as needed for wheezing or shortness of breath.    [provider]  lactulose (CHRONULAC) 10 GM/15ML solution Take 45 mLs (30 g total) by mouth 2 (two) times daily. Patient taking differently: Take 30 g by mouth every 12 (twelve) hours as needed (Encephalopathy).  05/07/17   Erick Blinks, MD  loperamide (IMODIUM A-D) 2 MG tablet Take 2 mg by mouth every 4 (four) hours as needed for diarrhea or loose stools.    [provider]  loratadine (CLARITIN) 10 MG tablet Take  10 mg by mouth daily.    [provider]  Melatonin 5 MG TABS Take 10 mg by mouth at bedtime.    [provider]  Multiple Vitamins-Minerals (THERA-M PO) Take 1 tablet by mouth daily.    [provider]  oxyCODONE 10 MG TABS Take 1 tablet (10 mg total) by mouth every 4 (four) hours as needed for moderate pain or severe pain. 07/29/20 07/29/21  Nita Sickle, MD  Oxycodone HCl 10 MG TABS Take 1 tablet (10 mg total) by mouth every 6 (six) hours as needed. 12/06/19   Despina Hidden, PA-C  primidone (MYSOLINE) 250 MG tablet Take 250 mg by mouth 3 (three) times daily. Take with 50 mg to equal 300 mg 3 times daily    [provider]  primidone (MYSOLINE) 50 MG tablet Take 50 mg by mouth 3 (three) times daily. Take with 250 mg to equal 300 mg 3 times daily    [provider]  senna (SENOKOT) 8.6 MG TABS tablet Take 1 tablet by mouth at bedtime. Hold for loose stools    [provider]  umeclidinium-vilanterol (ANORO ELLIPTA) 62.5-25 MCG/INH AEPB Inhale 1 puff into the lungs daily.    [provider]   zolpidem (AMBIEN) 10 MG tablet Take 10 mg by mouth at bedtime.    [provider]    Allergies    Morphine and related and Penicillins  Review of Systems   Review of Systems  Constitutional:       Per HPI, otherwise negative  HENT:       Per HPI, otherwise negative  Respiratory:       Per HPI, otherwise negative  Cardiovascular:       Per HPI, otherwise negative  Gastrointestinal: Negative for vomiting.  Endocrine:       Negative aside from HPI  Genitourinary:       Neg aside from HPI   Musculoskeletal:       Per HPI, otherwise negative  Skin: Negative.   Neurological: Negative for syncope.    Physical Exam Updated Vital Signs BP (!) 142/97   Pulse 67   Temp 98 F (36.7 C) (Oral)   Resp 18   Ht 5\' 11"  (1.803 m)   Wt 82.6 kg   SpO2 98%   BMI 25.38 kg/m   Physical Exam Vitals and nursing note reviewed.  Constitutional:      General: He is not in acute distress.    Appearance: He is well-developed.  HENT:     Head: Normocephalic and atraumatic.  Eyes:     Conjunctiva/sclera: Conjunctivae normal.  Cardiovascular:     Rate and Rhythm: Normal rate and regular rhythm.  Pulmonary:     Effort: Pulmonary effort is normal. No respiratory distress.     Breath sounds: No stridor.  Abdominal:     General: There is no distension.  Musculoskeletal:     Comments: Scar medial aspect right knee inferiorly.  Otherwise unremarkable, grossly.  Skin:    General: Skin is warm and dry.  Neurological:     Mental Status: He is alert and oriented to person, place, and time.      ED Results / Procedures / Treatments   Labs (all labs ordered are listed, but only abnormal results are displayed) Labs Reviewed  COMPREHENSIVE METABOLIC PANEL - Abnormal; Notable for the following components:      Result Value   Potassium 3.2 (*)    Calcium 8.4 (*)  AST 73 (*)    ALT 47 (*)    All other components within normal limits  MAGNESIUM - Abnormal; Notable for the  following components:   Magnesium 1.6 (*)    All other components within normal limits  CBG MONITORING, ED - Abnormal; Notable for the following components:   Glucose-Capillary 111 (*)    All other components within normal limits  BRAIN NATRIURETIC PEPTIDE  CBC WITH DIFFERENTIAL/PLATELET  PROTIME-INR  TROPONIN I (HIGH SENSITIVITY)  TROPONIN I (HIGH SENSITIVITY)    EKG EKG Interpretation  Date/Time:  Tuesday April 27 2021 15:38:17 EDT Ventricular Rate:  63 PR Interval:  124 QRS Duration: 93 QT Interval:  439 QTC Calculation: 450 R Axis:   68 Text Interpretation: Sinus rhythm Atrial premature complex RSR' in V1 or V2, right VCD or RVH unremarkable ECG Confirmed by Gerhard Munch [phone]) on 04/27/2021 3:54:43 PM   Radiology DG Chest Portable 1 View  Result Date: 04/27/2021 CLINICAL DATA:  Chest pain. EXAM: PORTABLE CHEST 1 VIEW COMPARISON:  December 01, 2018. FINDINGS: The heart size and mediastinal contours are within normal limits. No pneumothorax is noted. Stable bibasilar opacities are noted concerning for scarring. No definite acute abnormality is noted. The visualized skeletal structures are unremarkable. IMPRESSION: No active disease. Electronically Signed   By: Lupita Raider M.D.   On: 04/27/2021 16:56    Procedures Procedures   Medications Ordered in ED Medications  aspirin chewable tablet 324 mg (has no administration in time range)  HYDROmorphone (DILAUDID) injection 1 mg (has no administration in time range)  ondansetron (ZOFRAN) injection 4 mg (has no administration in time range)    ED Course  I have reviewed the triage vital signs and the nursing notes.  Pertinent labs & imaging results that were available during my care of the patient were reviewed by me and considered in my medical decision making (see chart for details).   Placed on continuous cardiac monitor, sinus rhythm, rate 60s, unremarkable Pulse oximetry 99% room air normal After my initial  evaluation patient requested IV narcotics, antiemetics. 5:49 PM On repeat exam the patient is awake, alert, in no distress.  We discussed all findings, and the patient is feeling better.  He does note that after not drinking alcohol for almost a decade he did have some last weekend, during his sweating.  Patient will receive Carafate, 1 additional dose of analgesia for his chronic knee pain, as part of his rationale for presentation with absence of pain control.  However, with otherwise reassuring findings, no evidence for ACS or other acute phenomena, including pneumonia, bacteremia, sepsis, PE, the patient is appropriate for discharge with outpatient follow-up.  I attempted to contact his legal guardian and left a message. MDM Rules/Calculators/A&P MDM Number of Diagnoses or Management Options Atypical chest pain: new, needed workup   Amount and/or Complexity of Data Reviewed Clinical lab tests: ordered and reviewed Tests in the radiology section of CPT: ordered and reviewed Tests in the medicine section of CPT: reviewed and ordered Decide to obtain previous medical records or to obtain history from someone other than the patient: yes Review and summarize past medical records: yes Independent visualization of images, tracings, or specimens: yes  Risk of Complications, Morbidity, and/or Mortality Presenting problems: high Diagnostic procedures: high Management options: high  Critical Care Total time providing critical care: < 30 minutes  Patient Progress Patient progress: improved  Final Clinical Impression(s) / ED Diagnoses Final diagnoses:  Atypical chest pain  Gerhard Munch, MD 04/27/21 [phone]

## 2021-04-27 NOTE — Progress Notes (Signed)
Spoke with Marshell Garfinkel at [facility] stating family had picked up patient and are currently inbound to their facility. Dunn stated that he had spoken with someone for half an hour as to why pt was not to be discharged to family but must arrive via EMS. He stated family had enabled pt to overdose last time they transported him. Patient is A&O x4 and ambulatory, so EMS would not take patient, and per report from previous RN, staff at [facility] had stated they were unable to come get patient. IV's removed, pt education done, pt stated he was in no pain when he left.

## 2021-04-27 NOTE — ED Notes (Addendum)
Attempt to call [facility] to arrange transport, no answer. Will attempt again

## 2021-04-27 NOTE — ED Triage Notes (Signed)
Pt brought to ED via [facility] from [facility] for chest pain started today. Pt given 1 nitro and ASA 324 mg. Pt started with N/V/D and headache. Pt is non compliant with pain medication. Pt left [facility] for 1 week to get married with Oxycodone 28 tablets and came back with 3 per facility.

## 2021-05-12 ENCOUNTER — Other Ambulatory Visit: Payer: Self-pay

## 2021-05-12 ENCOUNTER — Emergency Department (HOSPITAL_COMMUNITY): Payer: Medicare HMO

## 2021-05-12 ENCOUNTER — Encounter (HOSPITAL_COMMUNITY): Payer: Self-pay | Admitting: *Deleted

## 2021-05-12 ENCOUNTER — Emergency Department (HOSPITAL_COMMUNITY)
Admission: EM | Admit: 2021-05-12 | Discharge: 2021-05-12 | Disposition: A | Discharge status: Home / Self Care | Payer: Medicare HMO | Attending: Emergency Medicine | Admitting: Emergency Medicine

## 2021-05-12 DIAGNOSIS — R109 Unspecified abdominal pain: Secondary | ICD-10-CM | POA: Diagnosis not present

## 2021-05-12 DIAGNOSIS — K5792 Diverticulitis of intestine, part unspecified, without perforation or abscess without bleeding: Secondary | ICD-10-CM

## 2021-05-12 DIAGNOSIS — Z743 Need for continuous supervision: Secondary | ICD-10-CM | POA: Diagnosis not present

## 2021-05-12 DIAGNOSIS — Z20822 Contact with and (suspected) exposure to covid-19: Secondary | ICD-10-CM | POA: Diagnosis not present

## 2021-05-12 DIAGNOSIS — Z7951 Long term (current) use of inhaled steroids: Secondary | ICD-10-CM | POA: Insufficient documentation

## 2021-05-12 DIAGNOSIS — J45909 Unspecified asthma, uncomplicated: Secondary | ICD-10-CM | POA: Insufficient documentation

## 2021-05-12 DIAGNOSIS — I5023 Acute on chronic systolic (congestive) heart failure: Secondary | ICD-10-CM | POA: Insufficient documentation

## 2021-05-12 DIAGNOSIS — F17210 Nicotine dependence, cigarettes, uncomplicated: Secondary | ICD-10-CM | POA: Diagnosis not present

## 2021-05-12 DIAGNOSIS — R062 Wheezing: Secondary | ICD-10-CM | POA: Diagnosis not present

## 2021-05-12 DIAGNOSIS — I509 Heart failure, unspecified: Secondary | ICD-10-CM

## 2021-05-12 DIAGNOSIS — R609 Edema, unspecified: Secondary | ICD-10-CM | POA: Diagnosis not present

## 2021-05-12 DIAGNOSIS — E871 Hypo-osmolality and hyponatremia: Secondary | ICD-10-CM | POA: Diagnosis not present

## 2021-05-12 DIAGNOSIS — R6889 Other general symptoms and signs: Secondary | ICD-10-CM | POA: Diagnosis not present

## 2021-05-12 DIAGNOSIS — J90 Pleural effusion, not elsewhere classified: Secondary | ICD-10-CM | POA: Diagnosis not present

## 2021-05-12 DIAGNOSIS — I110 Hypertensive heart disease with heart failure: Secondary | ICD-10-CM | POA: Diagnosis not present

## 2021-05-12 DIAGNOSIS — J449 Chronic obstructive pulmonary disease, unspecified: Secondary | ICD-10-CM | POA: Insufficient documentation

## 2021-05-12 DIAGNOSIS — Z79899 Other long term (current) drug therapy: Secondary | ICD-10-CM | POA: Insufficient documentation

## 2021-05-12 DIAGNOSIS — K5732 Diverticulitis of large intestine without perforation or abscess without bleeding: Secondary | ICD-10-CM | POA: Insufficient documentation

## 2021-05-12 DIAGNOSIS — R0602 Shortness of breath: Secondary | ICD-10-CM | POA: Diagnosis not present

## 2021-05-12 LAB — LIPASE, BLOOD: Lipase: 29 U/L (ref 11–51)

## 2021-05-12 LAB — COMPREHENSIVE METABOLIC PANEL
ALT: 26 U/L (ref 0–44)
AST: 20 U/L (ref 15–41)
Albumin: 4.1 g/dL (ref 3.5–5.0)
Alkaline Phosphatase: 64 U/L (ref 38–126)
Anion gap: 7 (ref 5–15)
BUN: 7 mg/dL (ref 6–20)
CO2: 27 mmol/L (ref 22–32)
Calcium: 8.7 mg/dL — ABNORMAL LOW (ref 8.9–10.3)
Chloride: 103 mmol/L (ref 98–111)
Creatinine, Ser: 0.81 mg/dL (ref 0.61–1.24)
GFR, Estimated: >60 mL/min (ref >60)
Glucose, Bld: 95 mg/dL (ref 70–99)
Potassium: 3.9 mmol/L (ref 3.5–5.1)
Sodium: 137 mmol/L (ref 135–145)
Total Bilirubin: 0.4 mg/dL (ref 0.3–1.2)
Total Protein: 6.5 g/dL (ref 6.5–8.1)

## 2021-05-12 LAB — CBC WITH DIFFERENTIAL/PLATELET
Abs Immature Granulocytes: 0.02 10*3/uL (ref 0.00–0.07)
Basophils Absolute: 0.1 10*3/uL (ref 0.0–0.1)
Basophils Relative: 1 %
Eosinophils Absolute: 0.1 10*3/uL (ref 0.0–0.5)
Eosinophils Relative: 2 %
HCT: 40.2 % (ref 39.0–52.0)
Hemoglobin: 13.8 g/dL (ref 13.0–17.0)
Immature Granulocytes: 0 %
Lymphocytes Relative: 21 %
Lymphs Abs: 1.1 10*3/uL (ref 0.7–4.0)
MCH: 33.2 pg (ref 26.0–34.0)
MCHC: 34.3 g/dL (ref 30.0–36.0)
MCV: 96.6 fL (ref 80.0–100.0)
Monocytes Absolute: 0.5 10*3/uL (ref 0.1–1.0)
Monocytes Relative: 9 %
Neutro Abs: 3.5 10*3/uL (ref 1.7–7.7)
Neutrophils Relative %: 67 %
Platelets: 273 10*3/uL (ref 150–400)
RBC: 4.16 MIL/uL — ABNORMAL LOW (ref 4.22–5.81)
RDW: 15.5 % (ref 11.5–15.5)
WBC: 5.2 10*3/uL (ref 4.0–10.5)
nRBC: 0 % (ref 0.0–0.2)

## 2021-05-12 LAB — URINALYSIS, ROUTINE W REFLEX MICROSCOPIC
Bilirubin Urine: NEGATIVE
Glucose, UA: NEGATIVE mg/dL
Hgb urine dipstick: NEGATIVE
Ketones, ur: NEGATIVE mg/dL
Leukocytes,Ua: NEGATIVE
Nitrite: NEGATIVE
Protein, ur: NEGATIVE mg/dL
Specific Gravity, Urine: 1.004 — ABNORMAL LOW (ref 1.005–1.030)
pH: 7 (ref 5.0–8.0)

## 2021-05-12 LAB — BRAIN NATRIURETIC PEPTIDE: B Natriuretic Peptide: 55 pg/mL (ref 0.0–100.0)

## 2021-05-12 LAB — RESP PANEL BY RT-PCR (FLU A&B, COVID) ARPGX2
Influenza A by PCR: NEGATIVE
Influenza B by PCR: NEGATIVE
SARS Coronavirus 2 by RT PCR: NEGATIVE

## 2021-05-12 MED ORDER — METRONIDAZOLE 500 MG PO TABS: 500.0000 mg | ORAL_TABLET | Freq: Two times a day (BID) | ORAL | 0 refills | Status: DC

## 2021-05-12 MED ORDER — FUROSEMIDE 20 MG PO TABS: 20.0000 mg | ORAL_TABLET | Freq: Two times a day (BID) | ORAL | 0 refills | Status: DC

## 2021-05-12 MED ORDER — IOHEXOL 300 MG/ML  SOLN
100.0000 mL | Freq: Once | INTRAMUSCULAR | Status: AC | PRN
  Administered 2021-05-12: 100 mL via INTRAVENOUS

## 2021-05-12 MED ORDER — CIPROFLOXACIN HCL 500 MG PO TABS: 500.0000 mg | ORAL_TABLET | Freq: Two times a day (BID) | ORAL | 0 refills | Status: AC

## 2021-05-12 MED ORDER — FUROSEMIDE 10 MG/ML IJ SOLN
20.0000 mg | Freq: Once | INTRAMUSCULAR | Status: AC
  Administered 2021-05-12: 20 mg via INTRAVENOUS
  Filled 2021-05-12: qty 2

## 2021-05-12 MED ORDER — OXYCODONE-ACETAMINOPHEN 5-325 MG PO TABS
1.0000 | ORAL_TABLET | Freq: Once | ORAL | Status: AC
Start: 2021-05-12 — End: 2021-05-12
  Administered 2021-05-12: 1 via ORAL
  Filled 2021-05-12: qty 1

## 2021-05-12 NOTE — ED Notes (Signed)
Pt on RA at home.

## 2021-05-12 NOTE — ED Provider Notes (Signed)
[facility] EMERGENCY DEPARTMENT Provider Note   CSN: [id number] Arrival date & time: 05/12/21  1600     History Chief Complaint  Patient presents with   Shortness of Breath    [name] is a 45 y.o. male with pertinent past medical history of CHF, COPD, hypertension, anxiety that presents to the emerge department today for shortness of breath and multiple other complaints.  Patient states that he feels as if there is fluid in his lungs.  He states that has been feeling this way for the past 4 to 5 days, feels worse at night.  Patient states that he does not take anything  consistently for his CHF, states that he takes Lasix as needed.  Patient states that he does smoke about 6 cigarettes daily, has had a cough for a while now.  States that cough is getting worse over the last 4 days, however states that he is spitting up clear sputum.  Denies any fevers, sore throat, congestion, postnasal drip, myalgias, diarrhea nausea or vomiting.  Denies any weight gain or feeling as if his legs are swelling.  Patient states that he is primarily here for shortness of breath, denies any chest pain.  Denies any sick contacts.  Patient also states that for the past 6 months he is having some intermittent left lower quadrant pain, comes and goes.  Also admits to some loose stools for over a month.  No triggers. No radiation. Denies any nausea or vomiting.  Triage note also complaining of numbness in his right arm for the past multiple months, patient does not express this to me.  HPI     Past Medical History:  Diagnosis Date   Anxiety    Arthritis    knees and fingers   Asthma    CHF (congestive heart failure) (HCC)    COPD (chronic obstructive pulmonary disease) (HCC)    Depression    Encephalopathy    GERD (gastroesophageal reflux disease)    Hypertension    Hyponatremia 06/2016   Pneumonia 2017   Renal disorder    kidney injury    Patient Active Problem List   Diagnosis Date Noted    Closed fracture of right proximal tibia 11/05/2019   Closed displaced bicondylar fracture of right tibia 12/03/2018   Cellulitis of left lower extremity    Cellulitis of left foot 03/07/2018   COPD (chronic obstructive pulmonary disease) (HCC) 05/03/2017   Hyponatremia 06/27/2016   GERD (gastroesophageal reflux disease) 11/11/2015   Borderline personality disorder (HCC) 11/11/2015   Alcohol use disorder, severe, in controlled environment (HCC) 11/11/2015   MDD (major depressive disorder), recurrent severe, 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   Tobacco abuse 11/05/2013    Past Surgical History:  Procedure Laterality Date   COLONOSCOPY     ESOPHAGOGASTRODUODENOSCOPY     EXTERNAL FIXATION LEG Right 12/01/2018   Procedure: EXTERNAL FIXATION LEG;  Surgeon: Juanell Fairly, MD;  Location: [facility] ORS;  Service: Orthopedics;  Laterality: Right;   EXTERNAL FIXATION REMOVAL Right 12/06/2018   Procedure: REMOVAL EXTERNAL FIXATION LEG;  Surgeon: Roby Lofts, MD;  Location: [facility] OR;  Service: Orthopedics;  Laterality: Right;   FINGER SURGERY Left    5th digit-Fracture-"worse now than before"   HARDWARE REMOVAL Right 12/06/2019   Procedure: HARDWARE REMOVAL RIGHT TIBIAL PLATEAU;  Surgeon: Roby Lofts, MD;  Location: [facility] OR;  Service: Orthopedics;  Laterality: Right;   ORIF TIBIA PLATEAU Right 12/06/2018   Procedure:  OPEN REDUCTION INTERNAL FIXATION (ORIF) TIBIAL PLATEAU;  Surgeon: Roby Lofts, MD;  Location: [facility] OR;  Service: Orthopedics;  Laterality: Right;   PLEURAL EFFUSION DRAINAGE Right 01/02/2016   Procedure: DRAINAGE OF PLEURAL EFFUSION;  Surgeon: Loreli Slot, MD;  Location: [facility] OR;  Service: Thoracic;  Laterality: Right;   VIDEO ASSISTED THORACOSCOPY (VATS)/DECORTICATION Right 01/02/2016   Procedure: VIDEO ASSISTED THORACOSCOPY (VATS)/DECORTICATION;  Surgeon: Loreli Slot, MD;  Location: [facility] OR;  Service: Thoracic;   Laterality: Right;   VIDEO BRONCHOSCOPY N/A 01/02/2016   Procedure: VIDEO BRONCHOSCOPY;  Surgeon: Loreli Slot, MD;  Location: [facility] OR;  Service: Thoracic;  Laterality: N/A;       Family History  Problem Relation Age of Onset   CAD Mother    Diabetes Mellitus II Father    Prostate cancer Paternal Grandfather     Social History   Tobacco Use   Smoking status: Every Day    Packs/day: 0.25    Years: 10.00    Pack years: 2.50    Types: Pipe, Cigarettes   Smokeless tobacco: Former    Quit date: 2013  Vaping Use   Vaping Use: Never used  Substance Use Topics   Alcohol use: No    Comment: pt states he no longer drinks 2013   Drug use: No    Home Medications Prior to Admission medications   Medication Sig Start Date End Date Taking? Authorizing Provider  ciprofloxacin (CIPRO) 500 MG tablet Take 1 tablet (500 mg total) by mouth every 12 (twelve) hours for 7 days. 05/12/21 05/19/21 Yes Harnoor Kohles, PA-C  furosemide (LASIX) 20 MG tablet Take 1 tablet (20 mg total) by mouth 2 (two) times daily for 5 days. 05/12/21 05/17/21 Yes Rollo Farquhar, PA-C  metroNIDAZOLE (FLAGYL) 500 MG tablet Take 1 tablet (500 mg total) by mouth 2 (two) times daily. 05/12/21  Yes Farrel Gordon, PA-C  acetaminophen (TYLENOL) 500 MG tablet Take 2 tablets (1,000 mg total) by mouth every 8 (eight) hours as needed for mild pain, moderate pain, fever or headache. 07/29/20 07/29/21  Nita Sickle, MD  busPIRone (BUSPAR) 5 MG tablet Take 5 mg by mouth 2 (two) times daily.    [provider]  Cholecalciferol (VITAMIN D3) 2000 units TABS Take 2,000 Units by mouth daily.     [provider]  cyclobenzaprine (FLEXERIL) 10 MG tablet Take 10 mg by mouth 3 (three) times daily.    [provider]  DEXTROMETHORPHAN HBR PO Take 1 capsule by mouth every 3 (three) hours as needed (cough).    [provider]  diclofenac Sodium (VOLTAREN) 1 % GEL Apply 2 g topically See admin instructions.  Apply 2 g transdermally every shift for pain to right knee    [provider]  divalproex (DEPAKOTE ER) 500 MG 24 hr tablet Take 4 tablets (2,000 mg total) by mouth at bedtime. 11/13/15   Jimmy Footman, MD  enalapril (VASOTEC) 10 MG tablet Take 10 mg by mouth daily.    [provider]  escitalopram (LEXAPRO) 20 MG tablet Take 20 mg by mouth daily.     [provider]  esomeprazole (NEXIUM) 40 MG capsule Take 40 mg by mouth daily.     [provider]  ferrous sulfate 325 (65 FE) MG tablet Take 1 tablet (325 mg total) by mouth 2 (two) times daily with a meal. 01/11/16   Regalado, Belkys A, MD  furosemide (LASIX) 20 MG tablet Take 1 tablet (20 mg total) by mouth  daily. 03/08/18   Vassie Loll, MD  gabapentin (NEURONTIN) 100 MG capsule Take 100 mg by mouth 3 (three) times daily.     [provider]  guaiFENesin (MUCINEX) 600 MG 12 hr tablet Take 600 mg by mouth 2 (two) times daily.    [provider]  hydrocortisone cream 1 % Apply 1 application topically every 8 (eight) hours as needed for itching.    [provider]  Ipratropium-Albuterol (COMBIVENT RESPIMAT) 20-100 MCG/ACT AERS respimat Inhale 2 puffs into the lungs every 6 (six) hours as needed for wheezing or shortness of breath.    [provider]  lactulose (CHRONULAC) 10 GM/15ML solution Take 45 mLs (30 g total) by mouth 2 (two) times daily. Patient taking differently: Take 30 g by mouth every 12 (twelve) hours as needed (Encephalopathy).  05/07/17   Erick Blinks, MD  loperamide (IMODIUM A-D) 2 MG tablet Take 2 mg by mouth every 4 (four) hours as needed for diarrhea or loose stools.    [provider]  loratadine (CLARITIN) 10 MG tablet Take 10 mg by mouth daily.    [provider]  Melatonin 5 MG TABS Take 10 mg by mouth at bedtime.    [provider]  Multiple Vitamins-Minerals (THERA-M PO) Take 1 tablet by mouth daily.    [provider]  oxyCODONE 10 MG TABS Take 1 tablet (10 mg total) by mouth every 4 (four) hours as needed for moderate pain or severe pain. 07/29/20 07/29/21  Nita Sickle, MD  Oxycodone HCl 10 MG TABS Take 1 tablet (10 mg total) by mouth every 6 (six) hours as needed. 12/06/19   Despina Hidden, PA-C  primidone (MYSOLINE) 250 MG tablet Take 250 mg by mouth 3 (three) times daily. Take with 50 mg to equal 300 mg 3 times daily    [provider]  primidone (MYSOLINE) 50 MG tablet Take 50 mg by mouth 3 (three) times daily. Take with 250 mg to equal 300 mg 3 times daily    [provider]  senna (SENOKOT) 8.6 MG TABS tablet Take 1 tablet by mouth at bedtime. Hold for loose stools    [provider]  umeclidinium-vilanterol (ANORO ELLIPTA) 62.5-25 MCG/INH AEPB Inhale 1 puff into the lungs daily.    [provider]  zolpidem (AMBIEN) 10 MG tablet Take 10 mg by mouth at bedtime.    [provider]    Allergies    Morphine and related and Penicillins  Review of Systems   Review of Systems  Constitutional:  Negative for chills, diaphoresis, fatigue and fever.  HENT:  Negative for congestion, sore throat and trouble swallowing.   Eyes:  Negative for pain and visual disturbance.  Respiratory:  Positive for cough and shortness of breath. Negative for wheezing.   Cardiovascular:  Negative for chest pain, palpitations and leg swelling.  Gastrointestinal:  Positive for abdominal pain and diarrhea. Negative for abdominal distention, nausea and vomiting.  Genitourinary:  Negative for difficulty urinating.  Musculoskeletal:  Negative for back pain, neck pain and neck stiffness.  Skin:  Negative for pallor.  Neurological:  Negative for dizziness, speech difficulty, weakness and headaches.  Psychiatric/Behavioral:  Negative for confusion.    Physical Exam Updated Vital Signs BP 140/85   Pulse (!) 55   Temp 97.8 F (36.6 C) (Oral)   Resp 10   Ht  (1.803  m)   Wt 84.8 kg   SpO2 98%   BMI 26.08 kg/m  Physical Exam Constitutional:      General: He is not in acute distress.    Appearance: Normal appearance. He is not ill-appearing, toxic-appearing or diaphoretic.  HENT:     Mouth/Throat:     Mouth: Mucous membranes are moist.     Pharynx: Oropharynx is clear.  Eyes:     General: No scleral icterus.    Extraocular Movements: Extraocular movements intact.     Pupils: Pupils are equal, round, and reactive to light.  Cardiovascular:     Rate and Rhythm: Normal rate and regular rhythm.     Pulses: Normal pulses.     Heart sounds: Normal heart sounds.  Pulmonary:     Effort: Pulmonary effort is normal. No respiratory distress.     Breath sounds: No stridor. Examination of the right-lower field reveals rales. Examination of the left-lower field reveals rales. Wheezing (mild) and rales present. No rhonchi.  Chest:     Chest wall: No tenderness.  Abdominal:     General: Abdomen is flat. There is no distension.     Palpations: Abdomen is soft.     Tenderness: There is no abdominal tenderness. There is no guarding or rebound.     Comments: Mild tenderness to LLQ, no rebound.   Musculoskeletal:        General: No swelling or tenderness. Normal range of motion.     Cervical back: Normal range of motion and neck supple. No rigidity.     Right lower leg: No edema.     Left lower leg: No edema.  Skin:    General: Skin is warm and dry.     Capillary Refill: Capillary refill takes less than 2 seconds.     Coloration: Skin is not pale.  Neurological:     General: No focal deficit present.     Mental Status: He is alert and oriented to person, place, and time.  Psychiatric:        Mood and Affect: Mood normal.        Behavior: Behavior normal.    ED Results / Procedures / Treatments   Labs (all labs ordered are listed, but only abnormal results are displayed) Labs Reviewed  CBC WITH DIFFERENTIAL/PLATELET - Abnormal; Notable for the  following components:      Result Value   RBC 4.16 (*)    All other components within normal limits  COMPREHENSIVE METABOLIC PANEL - Abnormal; Notable for the following components:   Calcium 8.7 (*)    All other components within normal limits  URINALYSIS, ROUTINE W REFLEX MICROSCOPIC - Abnormal; Notable for the following components:   Color, Urine STRAW (*)    Specific Gravity, Urine 1.004 (*)    All other components within normal limits  RESP PANEL BY RT-PCR (FLU A&B, COVID) ARPGX2  LIPASE, BLOOD  BRAIN NATRIURETIC PEPTIDE    EKG EKG Interpretation  Date/Time:  Wednesday May 12 2021 16:04:44 EDT Ventricular Rate:  62 PR Interval:  126 QRS Duration: 91 QT Interval:  408 QTC Calculation: 415 R Axis:   77 Text Interpretation: Sinus rhythm RSR' in V1 or V2, right VCD or RVH Confirmed by Norman ClayHong, Joshua ([id number]) on 05/12/2021 8:29:46 PM  Radiology DG Chest 2 View  Result Date: 05/12/2021 CLINICAL DATA:  Short of breath. EXAM: CHEST - 2 VIEW COMPARISON:  04/27/2021 FINDINGS: Lungs are hyperinflated. Normal pneumothorax. Small bilateral pleural effusions. No overt pulmonary edema. No pneumothorax. Mild interstitial pattern suggests interstitial edema. IMPRESSION: Hyperinflated lungs with mild interstitial edema pattern.  Electronically Signed   By: Genevive Bi M.D.   On: 05/12/2021 17:26   CT Abdomen Pelvis W Contrast  Result Date: 05/12/2021 CLINICAL DATA:  Left lower quadrant abdominal pain. Diarrhea. Symptoms for 1 month. EXAM: CT ABDOMEN AND PELVIS WITH CONTRAST TECHNIQUE: Multidetector CT imaging of the abdomen and pelvis was performed using the standard protocol following bolus administration of intravenous contrast. CONTRAST:  OMNIPAQUE IOHEXOL 300 MG/ML  SOLN COMPARISON:  CT 06/27/2016 FINDINGS: Lower chest: Minimal right pleural thickening. No acute airspace disease. Normal heart size. Hepatobiliary: No focal liver abnormality is seen. No gallstones, gallbladder wall  thickening, or biliary dilatation. Pancreas: No ductal dilatation or inflammation. Spleen: Normal in size without focal abnormality. Adrenals/Urinary Tract: Normal adrenal glands. No hydronephrosis or perinephric edema. Homogeneous renal enhancement with symmetric excretion on delayed phase imaging. Urinary bladder is physiologically distended without wall thickening. Stomach/Bowel: Left colonic diverticulosis. There is faint pericolonic stranding lead is sigmoid diverticula that may represent acute diverticulitis, series 2, image 50. No abscess or perforation. No associated colonic wall thickening. Normal appendix. Decompressed stomach. Unremarkable small bowel without obstruction or inflammation. Vascular/Lymphatic: Mild aortic atherosclerosis no aortic aneurysm. Patent portal vein. No portal venous or mesenteric gas. No enlarged lymph nodes in the abdomen or pelvis. Reproductive: Prostate is unremarkable. Other: No free air, free fluid, or intra-abdominal fluid collection. Musculoskeletal: Chronic L2 superior endplate compression fracture, seen on lumbar spine 12/01/2018. Remote right pubic rami fractures. IMPRESSION: Left colonic diverticulosis with faint pericolonic stranding adjacent to sigmoid diverticula that may represent acute diverticulitis. No perforation or abscess. Aortic Atherosclerosis (ICD10-I70.0). Electronically Signed   By: Narda Rutherford M.D.   On: 05/12/2021 18:55    Procedures Procedures   Medications Ordered in ED Medications  furosemide (LASIX) injection 20 mg (20 mg Intravenous Given 05/12/21 1851)  oxyCODONE-acetaminophen (PERCOCET/ROXICET) 5-325 MG per tablet 1 tablet (1 tablet Oral Given 05/12/21 1854)  iohexol (OMNIPAQUE) 300 MG/ML solution 100 mL (100 mLs Intravenous Contrast Given 05/12/21 1818)    ED Course  I have reviewed the triage vital signs and the nursing notes.  Pertinent labs & imaging results that were available during my care of the patient were reviewed by  me and considered in my medical decision making (see chart for details).    MDM Rules/Calculators/A&P                         Chanson Teems is a 45 y.o. male with pertinent past medical history of CHF, COPD, hypertension, anxiety that presents to the emerge department today for shortness of breath and multiple other complaints.  Patient appears very well, no respiratory distress.  Patient does have rales in bilateral lower extremities, does not appear fluid overloaded.  Patient has not been compliant with his Lasix, will give Lasix at this time since leading differential is most likely mild CHF exacerbation.  Denies any chest pain.  EKG was interpreted by me without any ST elevations or depressions.  Chest x-ray interpreted me with interstitial edema bilaterally.  In regards to abdomen, left lower quadrant is tender however no rebound tenderness, with no white count.  Patient is not vomiting and appears very well, tolerating p.o.  CT abdomen pelvis that showed diverticulosis with questionable diverticulitis on that side, will prophylax with antibiotics at this time since patient is having tenderness to palpation there and he does want antibiotics.  Did discuss side effects in depth with patient about antibiotics.  Patient agreeable with plan.  Upon reevaluation, patient states that he feels much better with Lasix on board.  He states that shortness of breath is mostly resolved.  Did discuss treatment plan about Lasix with patient, patient will follow up with cardiology.  Upon ambulation patient remained at 100% on room air, no tachypnea or tachycardia, patient states that he feels much better than when he came in.  Patient most likely with mild CHF exacerbation since he has not been taking his Lasix and due to diet.  Patient education provided, patient be discharged at this time.  Patient will follow up with both cardiology and GI.  Doubt need for further emergent work up at this time. I explained the  diagnosis and have given explicit precautions to return to the ER including for any other new or worsening symptoms. The patient understands and accepts the medical plan as it's been dictated and I have answered their questions. Discharge instructions concerning home care and prescriptions have been given. The patient is STABLE and is discharged to home in good condition.   Final Clinical Impression(s) / ED Diagnoses Final diagnoses:  Acute on chronic congestive heart failure, unspecified heart failure type (HCC)  Diverticulitis    Rx / DC Orders ED Discharge Orders          Ordered    furosemide (LASIX) 20 MG tablet  2 times daily        05/12/21 2023    metroNIDAZOLE (FLAGYL) 500 MG tablet  2 times daily        05/12/21 2023    ciprofloxacin (CIPRO) 500 MG tablet  Every 12 hours        05/12/21 2023             Farrel Gordon, PA-C 05/12/21 2052    Cheryll Cockayne, MD 05/21/21 [phone]

## 2021-05-12 NOTE — ED Notes (Signed)
Pt ambulate independently on RA . Oxygen 98%

## 2021-05-12 NOTE — ED Notes (Signed)
Pt ambulated around

## 2021-05-12 NOTE — Discharge Instructions (Addendum)
  You were evaluated in the Emergency Department and after careful evaluation, we did not find any emergent condition requiring admission or further testing in the hospital.   Your exam/testing today was overall reassuring.  Symptoms seem to be due to  mild CHF exacerbation.  I want you to take the Lasix once in the morning and once at night for the next 5 days.  Then start taking it once a day daily until you follow-up with your PCP.  I want you to follow-up with the cardiac doctor, I provided you information from them.  If you develop any chest pain or new or worsening shortness of breath please come back to the ER.  Stick to a low-salt diet, information is provided on this.  Your CT scan of your abdomen also looks like you are probably developing diverticulitis.  I prescribed you 2 antibiotics that I want you to start taking for this.  Do not drink alcohol when taking the Flagyl.  You can take Tylenol as directed on the bottle for pain.  I want you to follow-up with the GI doctor as well, their information is provided above.   Please return to the Emergency Department if you experience any worsening of your conditi [place]on.  Thank you for allowing [place] to be a part of your care. Please speak to your pharmacist about any new medications prescribed today in regards to side effects or interactions with other medications.

## 2021-05-12 NOTE — ED Triage Notes (Signed)
Pt brought in by [facility] from [facility] with c/o SOB. Pt has hx of fluid build up and says this feels similar. EMS reports wheezing in lower lung lobes. O2 sat 98% on [place]. Pt placed on O2 at 2L via [place] and pt reported it made him feel better. Pt also c/o LLQ abdominal pain and mucus in his stool x 1 month. Pt also c/o numbness in his right arm x 3-4 months.

## 2021-05-18 DIAGNOSIS — F445 Conversion disorder with seizures or convulsions: Secondary | ICD-10-CM | POA: Diagnosis not present

## 2021-05-18 DIAGNOSIS — G894 Chronic pain syndrome: Secondary | ICD-10-CM | POA: Diagnosis not present

## 2021-05-18 DIAGNOSIS — I10 Essential (primary) hypertension: Secondary | ICD-10-CM | POA: Diagnosis not present

## 2021-05-18 DIAGNOSIS — J449 Chronic obstructive pulmonary disease, unspecified: Secondary | ICD-10-CM | POA: Diagnosis not present

## 2021-05-25 DIAGNOSIS — F331 Major depressive disorder, recurrent, moderate: Secondary | ICD-10-CM | POA: Diagnosis not present

## 2021-05-25 DIAGNOSIS — G4701 Insomnia due to medical condition: Secondary | ICD-10-CM | POA: Diagnosis not present

## 2021-05-25 DIAGNOSIS — F064 Anxiety disorder due to known physiological condition: Secondary | ICD-10-CM | POA: Diagnosis not present

## 2021-06-03 DIAGNOSIS — R339 Retention of urine, unspecified: Secondary | ICD-10-CM | POA: Diagnosis not present

## 2021-06-03 DIAGNOSIS — R079 Chest pain, unspecified: Secondary | ICD-10-CM | POA: Diagnosis not present

## 2021-06-03 DIAGNOSIS — G4701 Insomnia due to medical condition: Secondary | ICD-10-CM | POA: Diagnosis not present

## 2021-06-03 DIAGNOSIS — R319 Hematuria, unspecified: Secondary | ICD-10-CM | POA: Diagnosis not present

## 2021-06-03 DIAGNOSIS — Z743 Need for continuous supervision: Secondary | ICD-10-CM | POA: Diagnosis not present

## 2021-06-03 DIAGNOSIS — R6889 Other general symptoms and signs: Secondary | ICD-10-CM | POA: Diagnosis not present

## 2021-06-03 DIAGNOSIS — R001 Bradycardia, unspecified: Secondary | ICD-10-CM | POA: Diagnosis not present

## 2021-06-03 DIAGNOSIS — F17200 Nicotine dependence, unspecified, uncomplicated: Secondary | ICD-10-CM | POA: Diagnosis not present

## 2021-06-03 DIAGNOSIS — R52 Pain, unspecified: Secondary | ICD-10-CM | POA: Diagnosis not present

## 2021-06-03 DIAGNOSIS — M25561 Pain in right knee: Secondary | ICD-10-CM | POA: Diagnosis not present

## 2021-06-09 DIAGNOSIS — H524 Presbyopia: Secondary | ICD-10-CM | POA: Diagnosis not present

## 2021-06-11 DIAGNOSIS — J449 Chronic obstructive pulmonary disease, unspecified: Secondary | ICD-10-CM | POA: Diagnosis not present

## 2021-06-11 DIAGNOSIS — I10 Essential (primary) hypertension: Secondary | ICD-10-CM | POA: Diagnosis not present

## 2021-06-11 DIAGNOSIS — F445 Conversion disorder with seizures or convulsions: Secondary | ICD-10-CM | POA: Diagnosis not present

## 2021-06-11 DIAGNOSIS — G894 Chronic pain syndrome: Secondary | ICD-10-CM | POA: Diagnosis not present

## 2021-06-11 DIAGNOSIS — K219 Gastro-esophageal reflux disease without esophagitis: Secondary | ICD-10-CM | POA: Diagnosis not present

## 2021-06-11 DIAGNOSIS — I509 Heart failure, unspecified: Secondary | ICD-10-CM | POA: Diagnosis not present

## 2021-06-18 DIAGNOSIS — J441 Chronic obstructive pulmonary disease with (acute) exacerbation: Secondary | ICD-10-CM | POA: Diagnosis not present

## 2021-06-18 DIAGNOSIS — G894 Chronic pain syndrome: Secondary | ICD-10-CM | POA: Diagnosis not present

## 2021-09-14 DIAGNOSIS — I5032 Chronic diastolic (congestive) heart failure: Secondary | ICD-10-CM | POA: Insufficient documentation

## 2021-09-14 NOTE — Progress Notes (Signed)
Cardiology Office Note   Date:  09/15/2021   ID:  [name], DOB 18-Dec-1975, MRN [id number]  PCP:  Dairl Ponder, NP  Cardiologist:   None Referring:     Chief Complaint  Patient presents with   Shortness of Breath      History of Present Illness: [name] is a 45 y.o. male who presents for evaluation of difficulty urinating.  He has a history of heart failure.  He was in the ED in June with chest pain that was not felt to be anginal and there was no evidence of ischemia.  He was in the ED in May also with SOB with some mild edema on CXR.  BNP was normal.  Echo in 2018 demonstrated an EF of 60 - 65%.    However, he does not describe swelling or weight gain.  He describes difficulty urinating and that he only has a small stream after he takes his Lasix.  He does not think he urinates a lot but he thinks sometimes his belly gets large.  He is not describing however PND or orthopnea.  He is not describing chest pressure, neck or arm discomfort.  He has no palpitations, presyncope or syncope.  He has been referred to a urologist and has yet to see them.  Other complaints include some watery diarrhea frequently.  It sounds like he is relatively sedentary living in a group home.  He goes out to smoke.  He did just get married however.   Past Medical History:  Diagnosis Date   Anxiety    Arthritis    knees and fingers   Asthma    CHF (congestive heart failure) (HCC)    COPD (chronic obstructive pulmonary disease) (HCC)    Depression    Encephalopathy    GERD (gastroesophageal reflux disease)    Hypertension    Hyponatremia 06/2016   Pneumonia 2017   Renal disorder    kidney injury    Past Surgical History:  Procedure Laterality Date   COLONOSCOPY     ESOPHAGOGASTRODUODENOSCOPY     EXTERNAL FIXATION LEG Right 12/01/2018   Procedure: EXTERNAL FIXATION LEG;  Surgeon: Juanell Fairly, MD;  Location: [facility] ORS;  Service: Orthopedics;  Laterality: Right;   EXTERNAL  FIXATION REMOVAL Right 12/06/2018   Procedure: REMOVAL EXTERNAL FIXATION LEG;  Surgeon: Roby Lofts, MD;  Location: [facility] OR;  Service: Orthopedics;  Laterality: Right;   FINGER SURGERY Left    5th digit-Fracture-"worse now than before"   HARDWARE REMOVAL Right 12/06/2019   Procedure: HARDWARE REMOVAL RIGHT TIBIAL PLATEAU;  Surgeon: Roby Lofts, MD;  Location: [facility] OR;  Service: Orthopedics;  Laterality: Right;   ORIF TIBIA PLATEAU Right 12/06/2018   Procedure: OPEN REDUCTION INTERNAL FIXATION (ORIF) TIBIAL PLATEAU;  Surgeon: Roby Lofts, MD;  Location: [facility] OR;  Service: Orthopedics;  Laterality: Right;   PLEURAL EFFUSION DRAINAGE Right 01/02/2016   Procedure: DRAINAGE OF PLEURAL EFFUSION;  Surgeon: Loreli Slot, MD;  Location: [facility] OR;  Service: Thoracic;  Laterality: Right;   VIDEO ASSISTED THORACOSCOPY (VATS)/DECORTICATION Right 01/02/2016   Procedure: VIDEO ASSISTED THORACOSCOPY (VATS)/DECORTICATION;  Surgeon: Loreli Slot, MD;  Location: [facility] OR;  Service: Thoracic;  Laterality: Right;   VIDEO BRONCHOSCOPY N/A 01/02/2016   Procedure: VIDEO BRONCHOSCOPY;  Surgeon: Loreli Slot, MD;  Location: [facility] OR;  Service: Thoracic;  Laterality: N/A;     Current Outpatient Medications  Medication Sig Dispense Refill   albuterol (PROVENTIL) (2.5 MG/3ML) 0.083% nebulizer solution  Take 2.5 mg by nebulization every 6 (six) hours as needed for wheezing or shortness of breath.     busPIRone (BUSPAR) 15 MG tablet Take 15 mg by mouth 3 (three) times daily.     Calcium Carbonate (CALCIUM 600 PO) Take 600 mg by mouth 2 (two) times daily.     DEXTROMETHORPHAN HBR PO Take 1 capsule by mouth every 3 (three) hours as needed (cough).     diclofenac Sodium (VOLTAREN) 1 % GEL Apply 2 g topically See admin instructions. Apply 2 g transdermally every shift for pain to right knee     escitalopram (LEXAPRO) 20 MG tablet Take 20 mg by mouth daily.      esomeprazole (NEXIUM) 40 MG capsule Take 40 mg by  mouth daily.      furosemide (LASIX) 20 MG tablet Take 1 tablet (20 mg total) by mouth daily. 90 tablet 3   losartan (COZAAR) 25 MG tablet Take 25 mg by mouth daily.     Melatonin 5 MG TABS Take 10 mg by mouth at bedtime.     methocarbamol (ROBAXIN) 750 MG tablet Take 750 mg by mouth 2 (two) times daily.     mirtazapine (REMERON) 15 MG tablet Take 15 mg by mouth at bedtime.     primidone (MYSOLINE) 250 MG tablet Take 250 mg by mouth 3 (three) times daily. Take with 50 mg to equal 300 mg 3 times daily     primidone (MYSOLINE) 50 MG tablet Take 50 mg by mouth 3 (three) times daily. Take with 250 mg to equal 300 mg 3 times daily     Suvorexant (BELSOMRA) 5 MG TABS Take 15 mg by mouth at bedtime.     traMADol (ULTRAM) 50 MG tablet Take 50 mg by mouth in the morning, at noon, and at bedtime.     traZODone (DESYREL) 50 MG tablet Take 50 mg by mouth at bedtime.     busPIRone (BUSPAR) 5 MG tablet Take 5 mg by mouth 2 (two) times daily.     Cholecalciferol (VITAMIN D3) 2000 units TABS Take 2,000 Units by mouth daily.      cyclobenzaprine (FLEXERIL) 10 MG tablet Take 10 mg by mouth 3 (three) times daily.     divalproex (DEPAKOTE ER) 500 MG 24 hr tablet Take 4 tablets (2,000 mg total) by mouth at bedtime. 120 tablet 0   enalapril (VASOTEC) 10 MG tablet Take 10 mg by mouth daily.     ferrous sulfate 325 (65 FE) MG tablet Take 1 tablet (325 mg total) by mouth 2 (two) times daily with a meal. 30 tablet 3   gabapentin (NEURONTIN) 100 MG capsule Take 100 mg by mouth 3 (three) times daily.      guaiFENesin (MUCINEX) 600 MG 12 hr tablet Take 600 mg by mouth 2 (two) times daily.     hydrocortisone cream 1 % Apply 1 application topically every 8 (eight) hours as needed for itching.     Ipratropium-Albuterol (COMBIVENT RESPIMAT) 20-100 MCG/ACT AERS respimat Inhale 2 puffs into the lungs every 6 (six) hours as needed for wheezing or shortness of breath.     lactulose (CHRONULAC) 10 GM/15ML solution Take 45 mLs (30 g  total) by mouth 2 (two) times daily. (Patient taking differently: Take 30 g by mouth every 12 (twelve) hours as needed (Encephalopathy). ) 240 mL 0   loperamide (IMODIUM A-D) 2 MG tablet Take 2 mg by mouth every 4 (four) hours as needed for diarrhea or loose stools.  loratadine (CLARITIN) 10 MG tablet Take 10 mg by mouth daily.     metroNIDAZOLE (FLAGYL) 500 MG tablet Take 1 tablet (500 mg total) by mouth 2 (two) times daily. 14 tablet 0   Multiple Vitamins-Minerals (THERA-M PO) Take 1 tablet by mouth daily.     Oxycodone HCl 10 MG TABS Take 1 tablet (10 mg total) by mouth every 6 (six) hours as needed. 30 tablet 0   senna (SENOKOT) 8.6 MG TABS tablet Take 1 tablet by mouth at bedtime. Hold for loose stools     umeclidinium-vilanterol (ANORO ELLIPTA) 62.5-25 MCG/INH AEPB Inhale 1 puff into the lungs daily.     zolpidem (AMBIEN) 10 MG tablet Take 10 mg by mouth at bedtime.     No current facility-administered medications for this visit.    Allergies:   Morphine and related and Penicillins    Social History:  The patient  reports that he has been smoking pipe and cigarettes. He has a 2.50 pack-year smoking history. He quit smokeless tobacco use about 9 years ago. He reports that he does not drink alcohol and does not use drugs.   Family History:  The patient's family history includes CAD (age of onset: 5) in his mother; Diabetes Mellitus II in his father; Prostate cancer in his paternal grandfather.    ROS:  Please see the history of present illness.   Otherwise, review of systems are positive for knee pain.   All other systems are reviewed and negative.    PHYSICAL EXAM: VS:  BP 128/84   Pulse 68   Ht 5\' 11"  (1.803 m)   Wt 201 lb (91.2 kg)   BMI 28.03 kg/m  , BMI Body mass index is 28.03 kg/m. GENERAL:  Well appearing HEENT:  Pupils equal round and reactive, fundi not visualized, oral mucosa unremarkable NECK:  No jugular venous distention, waveform within normal limits, carotid  upstroke brisk and symmetric, no bruits, no thyromegaly LYMPHATICS:  No cervical, inguinal adenopathy LUNGS:  Clear to auscultation bilaterally BACK:  No CVA tenderness CHEST:  Unremarkable HEART:  PMI not displaced or sustained,S1 and S2 within normal limits, no S3, no S4, no clicks, no rubs, no murmurs ABD:  Flat, positive bowel sounds normal in frequency in pitch, no bruits, no rebound, no guarding, no midline pulsatile mass, no hepatomegaly, no splenomegaly EXT:  2 plus pulses throughout, no edema, no cyanosis no clubbing SKIN:  No rashes no nodules NEURO:  Cranial nerves II through XII grossly intact, motor grossly intact throughout PSYCH:  Cognitively intact, oriented to person place and time    EKG:  EKG is not ordered today. The ekg ordered 05/12/2021 demonstrates sinus rhythm, rate 62, RSR prime in V1, nonspecific inferior T wave changes.   Recent Labs: 04/27/2021: Magnesium 1.6 05/12/2021: ALT 26; B Natriuretic Peptide 55.0; BUN 7; Creatinine, Ser 0.81; Hemoglobin 13.8; Platelets 273; Potassium 3.9; Sodium 137    Lipid Panel    Component Value Date/Time   CHOL 127 06/01/2014 0702   TRIG 123 06/01/2014 0702   HDL 39 (L) 06/01/2014 0702   VLDL 25 06/01/2014 0702   LDLCALC 63 06/01/2014 0702      Wt Readings from Last 3 Encounters:  09/15/21 201 lb (91.2 kg)  05/12/21 187 lb (84.8 kg)  04/27/21 182 lb (82.6 kg)      Other studies Reviewed: Additional studies/ records that were reviewed today include: Hospital records. Review of the above records demonstrates:  Please see elsewhere in the note.  ASSESSMENT AND PLAN:  Chronic diastolic HF: I think this is a very vague diagnosis.  There was nothing on his 2018 echo that was revealing.  His exam is unrevealing.  His shortness of breath gets better with albuterol and is likely related to his chronic lung disease that has been diagnosed and his ongoing tobacco abuse.  His chest x-ray in June was clearly indicative of  fluid and his BNP was normal.  At this point he and I discussed possible repeat echocardiography and he would like to defer.  I think his problem is more of his ability to urinate and following up with urology is appropriate.  HTN: His blood pressure is well controlled.  Continue the meds as listed.  He apparently has his labs followed by primary provider although I don't have access to these.  He says that he got recently    Current medicines are reviewed at length with the patient today.  The patient does not have concerns regarding medicines.  The following changes have been made:  no change  Labs/ tests ordered today include: None No orders of the defined types were placed in this encounter.    Disposition:   FU with me as needed.      Signed, Rollene Rotunda, MD  09/15/2021 10:10 AM    [facility]

## 2021-09-15 ENCOUNTER — Encounter: Payer: Self-pay | Admitting: Cardiology

## 2021-09-15 ENCOUNTER — Ambulatory Visit (INDEPENDENT_AMBULATORY_CARE_PROVIDER_SITE_OTHER): Payer: Medicare Other | Admitting: Cardiology

## 2021-09-15 ENCOUNTER — Other Ambulatory Visit: Payer: Self-pay

## 2021-09-15 VITALS — BP 128/84 | HR 68 | Ht 71.0 in | Wt 201.0 lb

## 2021-09-15 DIAGNOSIS — R0602 Shortness of breath: Secondary | ICD-10-CM

## 2021-09-15 DIAGNOSIS — I5032 Chronic diastolic (congestive) heart failure: Secondary | ICD-10-CM

## 2021-09-15 DIAGNOSIS — I10 Essential (primary) hypertension: Secondary | ICD-10-CM

## 2021-09-15 MED ORDER — FUROSEMIDE 20 MG PO TABS: 20.0000 mg | ORAL_TABLET | Freq: Every day | ORAL | 3 refills | Status: AC

## 2021-09-15 NOTE — Patient Instructions (Signed)
Medication Instructions:  Please take Furosemide 20 mg a day. Continue all other medications as listed.  *If you need a refill on your cardiac medications before your next appointment, please call your pharmacy*  Follow-Up: At [facility], you and your health needs are our priority.  As part of our continuing mission to provide you with exceptional heart care, we have created designated Provider Care Teams.  These Care Teams include your primary Cardiologist (physician) and Advanced Practice Providers (APPs -  Physician Assistants and Nurse Practitioners) who all work together to provide you with the care you need, when you need it.  We recommend signing up for the patient portal called "MyChart".  Sign up information is provided on this After Visit Summary.  MyChart is used to connect with patients for Virtual Visits (Telemedicine).  Patients are able to view lab/test results, encounter notes, upcoming appointments, etc.  Non-urgent messages can be sent to your provider as well.   To learn more about what you can do with MyChart, go to [email or web address].    Your next appointment:   Follow up as needed with Dr Antoine Poche.  Other Instructions Thank you for choosing [facility]!!

## 2021-10-12 ENCOUNTER — Ambulatory Visit (INDEPENDENT_AMBULATORY_CARE_PROVIDER_SITE_OTHER): Payer: Medicaid Other | Admitting: Gastroenterology

## 2021-10-12 ENCOUNTER — Encounter (INDEPENDENT_AMBULATORY_CARE_PROVIDER_SITE_OTHER): Payer: Self-pay | Admitting: Gastroenterology

## 2021-12-15 ENCOUNTER — Ambulatory Visit: Payer: Medicaid Other | Admitting: Urology

## 2022-01-06 ENCOUNTER — Ambulatory Visit (INDEPENDENT_AMBULATORY_CARE_PROVIDER_SITE_OTHER): Payer: Medicaid Other | Admitting: Gastroenterology

## 2022-01-10 IMAGING — RF DG C-ARM 1-60 MIN
1 series · 4 of 4 positions shown · non-contrast
Comparison: Radiographs dated 12/06/2018

CLINICAL DATA: Hardware removal from the right knee.

EXAM:
DG C-ARM 1-60 MIN; RIGHT KNEE - 1-2 VIEW
FLUOROSCOPY TIME:  Fluoroscopy Time:  44 seconds

[Series 1: run · 4 of 4 slices shown]
[im 1/4]
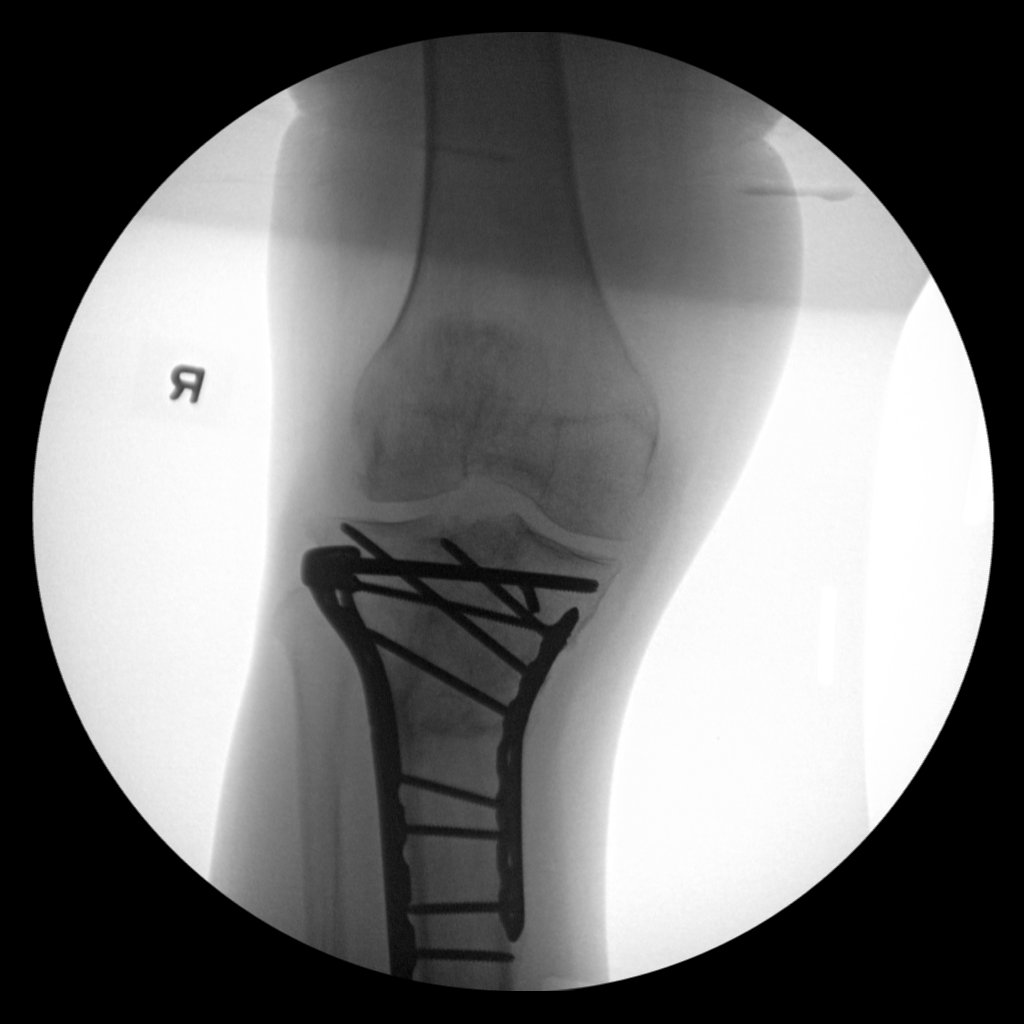
[im 2/4]
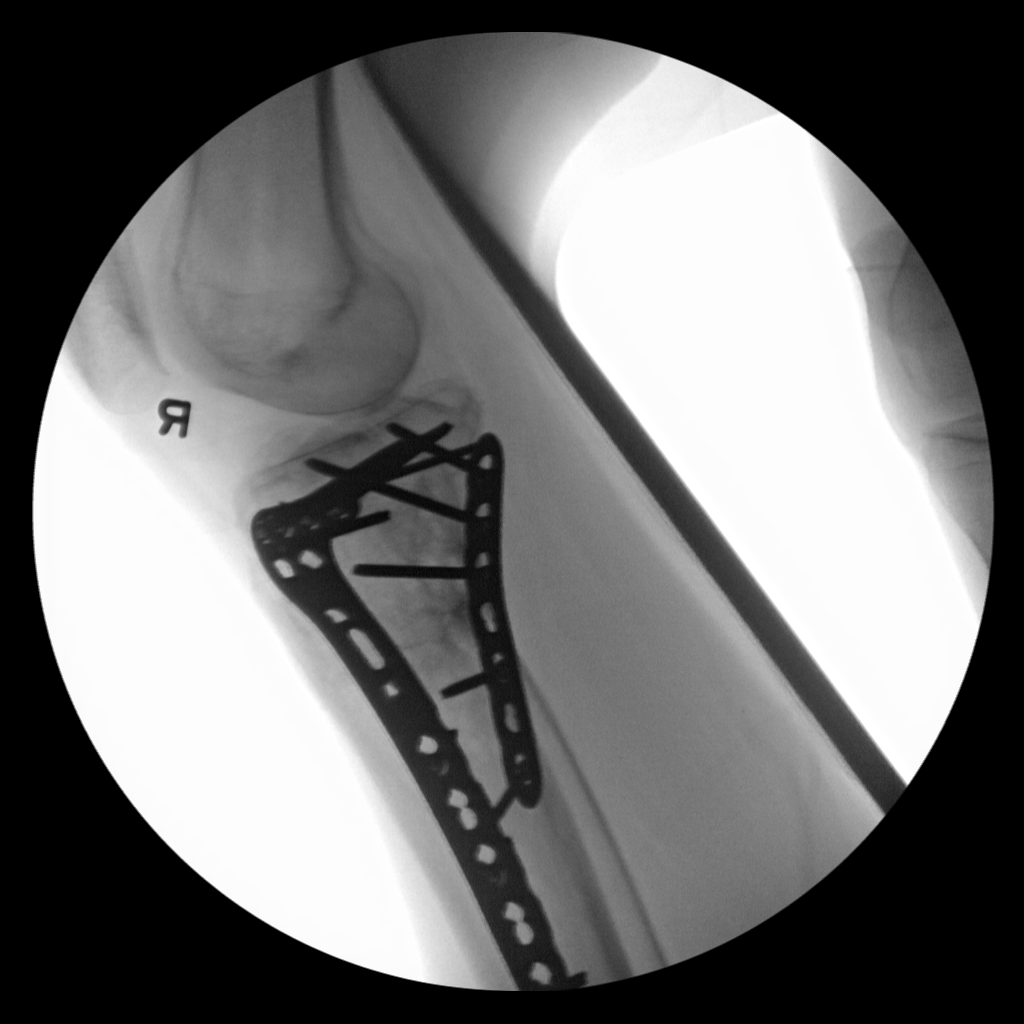
[im 3/4]
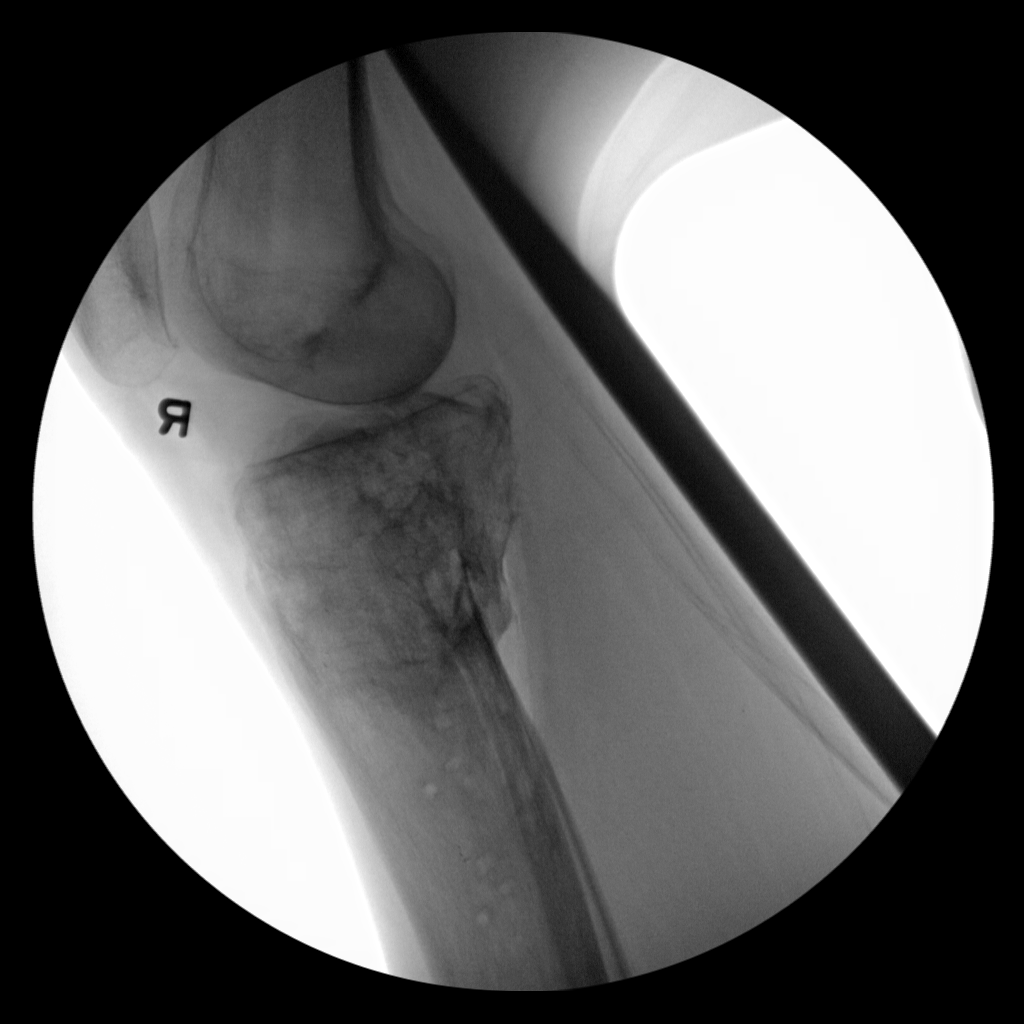
[im 4/4]
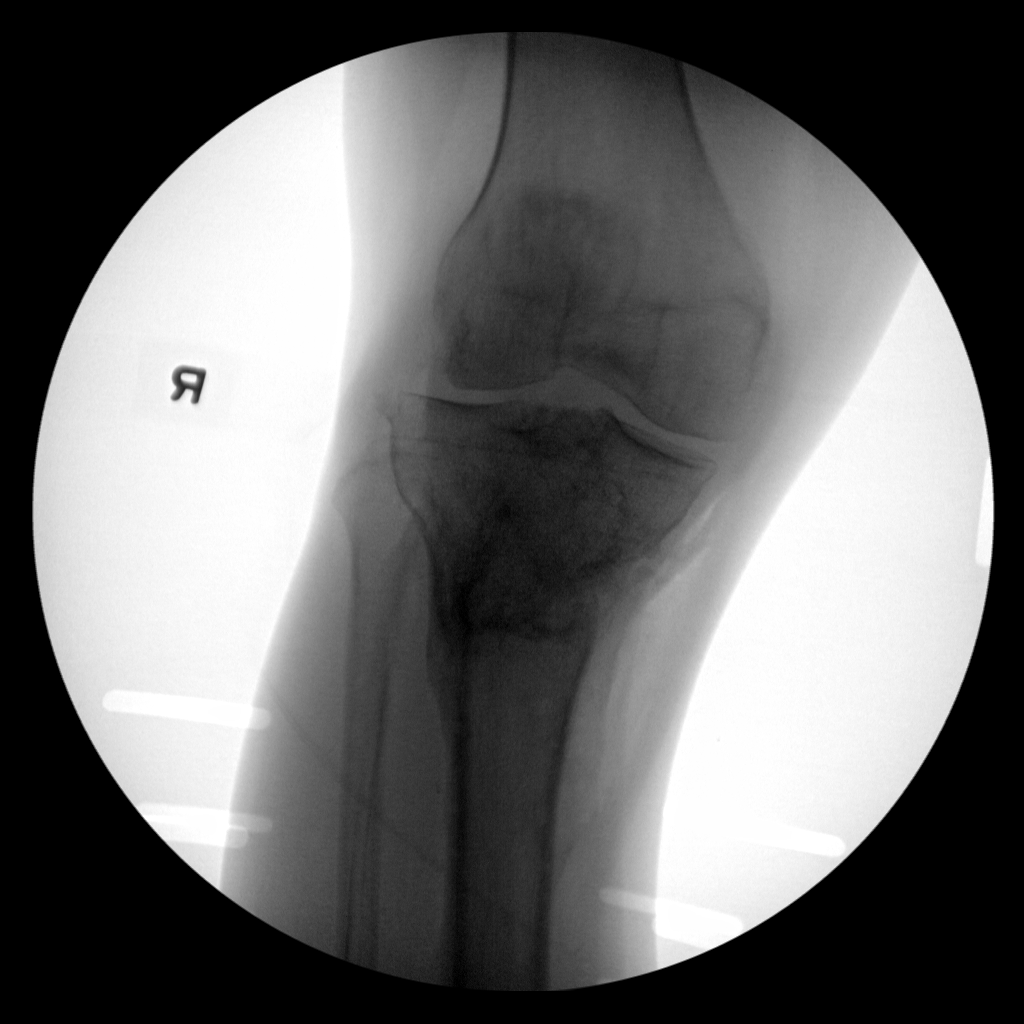

[4 of 4 positions shown; findings below may reference images not displayed]

FINDINGS: C-arm images demonstrate complete removal of the sideplate and
screws from the proximal right tibia. Proximal tibial fracture has
healed.
IMPRESSION: Satisfactory appearance of the right knee after removal of hardware.

FLUOROSCOPY TIME:  44 seconds

C-arm fluoroscopic images were obtained intraoperatively and
submitted for post operative interpretation.

## 2022-01-10 IMAGING — RF DG KNEE 1-2V*R*
1 series · 4 of 4 positions shown · non-contrast
Comparison: Radiographs dated 12/06/2018

CLINICAL DATA: Hardware removal from the right knee.

EXAM:
DG C-ARM 1-60 MIN; RIGHT KNEE - 1-2 VIEW
FLUOROSCOPY TIME:  Fluoroscopy Time:  44 seconds

[Series 1: run · 4 of 4 slices shown]
[im 1/4]
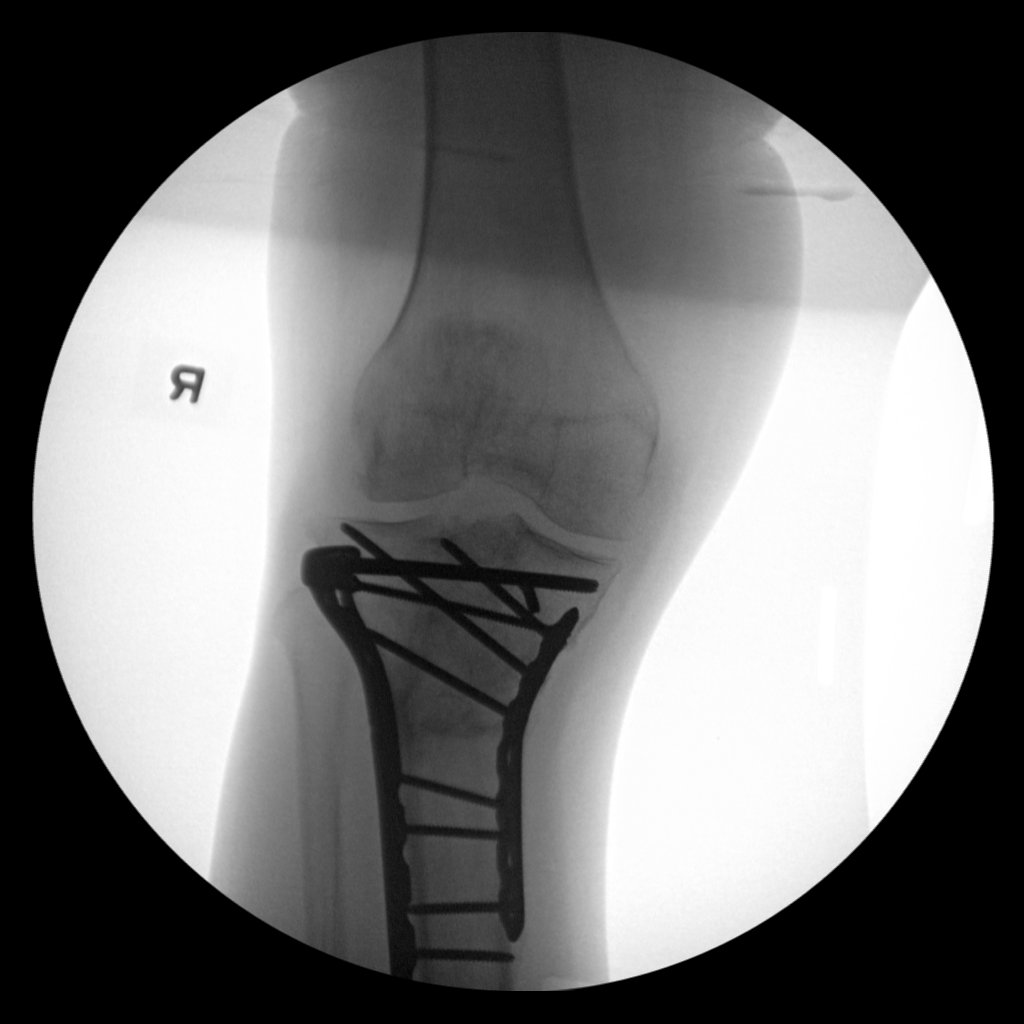
[im 2/4]
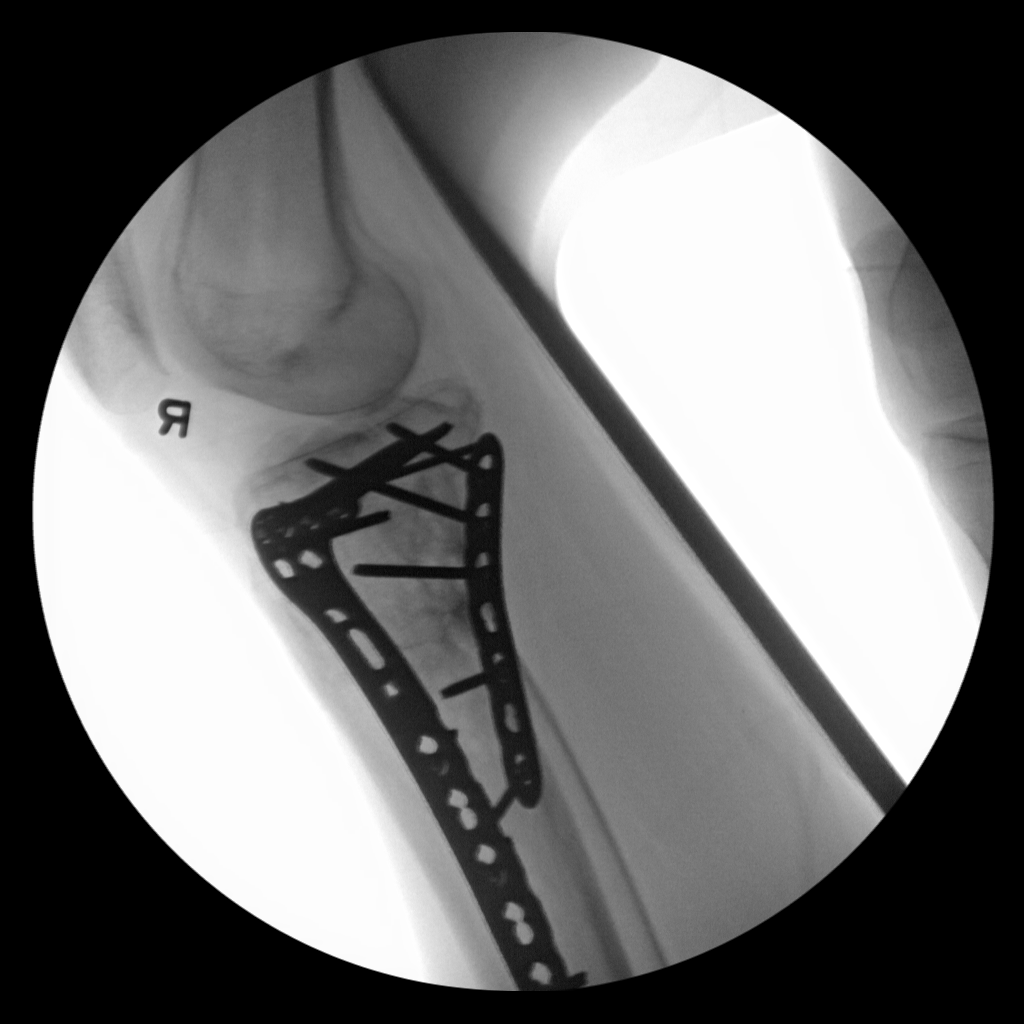
[im 3/4]
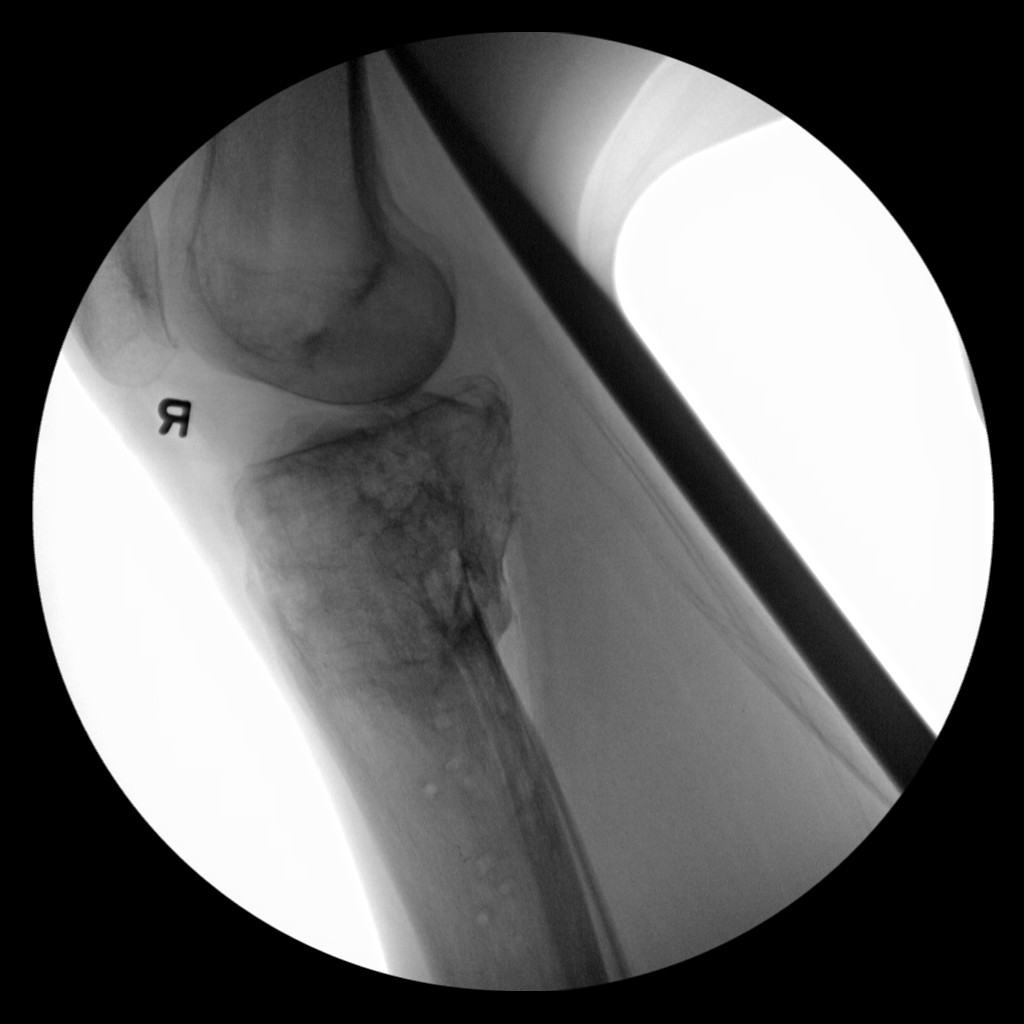
[im 4/4]
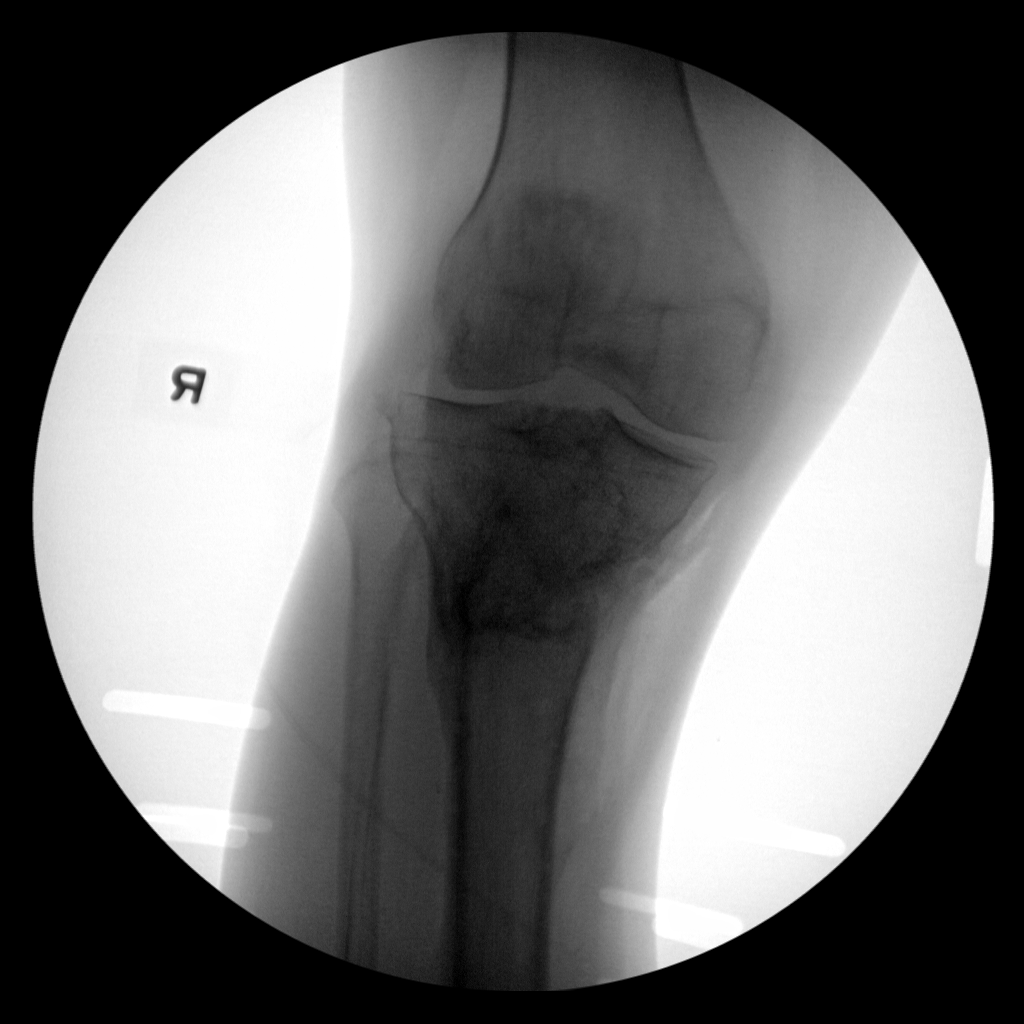

[4 of 4 positions shown; findings below may reference images not displayed]

FINDINGS: C-arm images demonstrate complete removal of the sideplate and
screws from the proximal right tibia. Proximal tibial fracture has
healed.
IMPRESSION: Satisfactory appearance of the right knee after removal of hardware.

FLUOROSCOPY TIME:  44 seconds

C-arm fluoroscopic images were obtained intraoperatively and
submitted for post operative interpretation.

## 2022-02-03 ENCOUNTER — Ambulatory Visit (INDEPENDENT_AMBULATORY_CARE_PROVIDER_SITE_OTHER): Payer: Medicaid Other | Admitting: Gastroenterology

## 2022-02-21 ENCOUNTER — Emergency Department (HOSPITAL_COMMUNITY): Payer: Medicare Other

## 2022-02-21 ENCOUNTER — Emergency Department (HOSPITAL_COMMUNITY)
Admission: EM | Admit: 2022-02-21 | Discharge: 2022-02-22 | Disposition: A | Discharge status: Home / Self Care | Payer: Medicare Other | Attending: Emergency Medicine | Admitting: Emergency Medicine

## 2022-02-21 ENCOUNTER — Encounter (HOSPITAL_COMMUNITY): Payer: Self-pay | Admitting: *Deleted

## 2022-02-21 DIAGNOSIS — M25461 Effusion, right knee: Secondary | ICD-10-CM | POA: Diagnosis not present

## 2022-02-21 DIAGNOSIS — W1811XA Fall from or off toilet without subsequent striking against object, initial encounter: Secondary | ICD-10-CM | POA: Insufficient documentation

## 2022-02-21 DIAGNOSIS — M25561 Pain in right knee: Secondary | ICD-10-CM | POA: Diagnosis present

## 2022-02-21 DIAGNOSIS — Y92002 Bathroom of unspecified non-institutional (private) residence single-family (private) house as the place of occurrence of the external cause: Secondary | ICD-10-CM | POA: Insufficient documentation

## 2022-02-21 MED ORDER — ONDANSETRON 8 MG PO TBDP
ORAL_TABLET | ORAL | Status: AC
  Filled 2022-02-21: qty 1

## 2022-02-21 MED ORDER — KETOROLAC TROMETHAMINE 60 MG/2ML IM SOLN
60.0000 mg | Freq: Once | INTRAMUSCULAR | Status: AC
  Administered 2022-02-21: 60 mg via INTRAMUSCULAR
  Filled 2022-02-21: qty 2

## 2022-02-21 MED ORDER — HYDROCODONE-ACETAMINOPHEN 5-325 MG PO TABS
1.0000 | ORAL_TABLET | Freq: Once | ORAL | Status: AC
  Administered 2022-02-21: 1 via ORAL
  Filled 2022-02-21: qty 1

## 2022-02-21 MED ORDER — ONDANSETRON 8 MG PO TBDP
8.0000 mg | ORAL_TABLET | Freq: Once | ORAL | Status: AC
  Administered 2022-02-21: 8 mg via ORAL
  Filled 2022-02-21: qty 1

## 2022-02-21 NOTE — ED Notes (Addendum)
Pt given meal per request  

## 2022-02-21 NOTE — ED Notes (Signed)
Pt sitting in hall next to his bed, NAD noted, no needs verbalized at this time. A&O x4 ?Currently waiting on transport.  ?

## 2022-02-21 NOTE — ED Provider Notes (Signed)
?[facility] EMERGENCY DEPARTMENT ?Provider Note ? ? ?CSN: [id number] ?Arrival date & time: 02/21/22  1455 ? ?  ? ?History ? ?Chief Complaint  ?Patient presents with  ? Fall  ? ? ?[name] is a 46 y.o. male. ? ? ?Fall ? ? ?Patient presents with right knee pain.  Patient states he was was getting up from the toilet yesterday lost balance.  He did not hit his head, he did fall to his right knee.  He is been having pain since then, has not had anything for pain yet.  Denies any numbness or tingling, no other symptoms. ? ?Home Medications ?Prior to Admission medications   ?Medication Sig Start Date End Date Taking? Authorizing Provider  ?albuterol (PROVENTIL) (2.5 MG/3ML) 0.083% nebulizer solution Take 2.5 mg by nebulization every 6 (six) hours as needed for wheezing or shortness of breath.    [provider]  ?busPIRone (BUSPAR) 15 MG tablet Take 15 mg by mouth 3 (three) times daily. 08/19/21   [provider]  ?busPIRone (BUSPAR) 5 MG tablet Take 5 mg by mouth 2 (two) times daily.    [provider]  ?Calcium Carbonate (CALCIUM 600 PO) Take 600 mg by mouth 2 (two) times daily.    [provider]  ?Cholecalciferol (VITAMIN D3) 2000 units TABS Take 2,000 Units by mouth daily.     [provider]  ?cyclobenzaprine (FLEXERIL) 10 MG tablet Take 10 mg by mouth 3 (three) times daily.    [provider]  ?DEXTROMETHORPHAN HBR PO Take 1 capsule by mouth every 3 (three) hours as needed (cough).    [provider]  ?diclofenac Sodium (VOLTAREN) 1 % GEL Apply 2 g topically See admin instructions. Apply 2 g transdermally every shift for pain to right knee    [provider]  ?divalproex (DEPAKOTE ER) 500 MG 24 hr tablet Take 4 tablets (2,000 mg total) by mouth at bedtime. 11/13/15   Jimmy FootmanHernandez-Gonzalez, Andrea, MD  ?enalapril (VASOTEC) 10 MG tablet Take 10 mg by mouth daily.    [provider]  ?escitalopram (LEXAPRO) 20 MG tablet Take 20 mg by mouth  daily.     [provider]  ?esomeprazole (NEXIUM) 40 MG capsule Take 40 mg by mouth daily.     [provider]  ?ferrous sulfate 325 (65 FE) MG tablet Take 1 tablet (325 mg total) by mouth 2 (two) times daily with a meal. 01/11/16   Regalado, Belkys A, MD  ?furosemide (LASIX) 20 MG tablet Take 1 tablet (20 mg total) by mouth daily. 09/15/21   Rollene RotundaHochrein, James, MD  ?gabapentin (NEURONTIN) 100 MG capsule Take 100 mg by mouth 3 (three) times daily.     [provider]  ?guaiFENesin (MUCINEX) 600 MG 12 hr tablet Take 600 mg by mouth 2 (two) times daily.    [provider]  ?hydrocortisone cream 1 % Apply 1 application topically every 8 (eight) hours as needed for itching.    [provider]  ?Ipratropium-Albuterol (COMBIVENT RESPIMAT) 20-100 MCG/ACT AERS respimat Inhale 2 puffs into the lungs every 6 (six) hours as needed for wheezing or shortness of breath.    [provider]  ?lactulose (CHRONULAC) 10 GM/15ML solution Take 45 mLs (30 g total) by mouth 2 (two) times daily. ?Patient taking differently: Take 30 g by mouth every 12 (twelve) hours as needed (Encephalopathy).  05/07/17   Erick BlinksMemon, Jehanzeb, MD  ?loperamide (IMODIUM A-D) 2 MG tablet Take 2 mg by mouth every 4 (four) hours  as needed for diarrhea or loose stools.    [provider]  ?loratadine (CLARITIN) 10 MG tablet Take 10 mg by mouth daily.    [provider]  ?losartan (COZAAR) 25 MG tablet Take 25 mg by mouth daily.    [provider]  ?Melatonin 5 MG TABS Take 10 mg by mouth at bedtime.    [provider]  ?methocarbamol (ROBAXIN) 750 MG tablet Take 750 mg by mouth 2 (two) times daily.    [provider]  ?metroNIDAZOLE (FLAGYL) 500 MG tablet Take 1 tablet (500 mg total) by mouth 2 (two) times daily. 05/12/21   Farrel Gordon, PA-C  ?mirtazapine (REMERON) 15 MG tablet Take 15 mg by mouth at bedtime.    [provider]  ?Multiple Vitamins-Minerals  (THERA-M PO) Take 1 tablet by mouth daily.    [provider]  ?Oxycodone HCl 10 MG TABS Take 1 tablet (10 mg total) by mouth every 6 (six) hours as needed. 12/06/19   West Bali, PA-C  ?primidone (MYSOLINE) 250 MG tablet Take 250 mg by mouth 3 (three) times daily. Take with 50 mg to equal 300 mg 3 times daily    [provider]  ?primidone (MYSOLINE) 50 MG tablet Take 50 mg by mouth 3 (three) times daily. Take with 250 mg to equal 300 mg 3 times daily    [provider]  ?senna (SENOKOT) 8.6 MG TABS tablet Take 1 tablet by mouth at bedtime. Hold for loose stools    [provider]  ?Suvorexant (BELSOMRA) 5 MG TABS Take 15 mg by mouth at bedtime.    [provider]  ?traMADol (ULTRAM) 50 MG tablet Take 50 mg by mouth in the morning, at noon, and at bedtime.    [provider]  ?traZODone (DESYREL) 50 MG tablet Take 50 mg by mouth at bedtime.    [provider]  ?umeclidinium-vilanterol (ANORO ELLIPTA) 62.5-25 MCG/INH AEPB Inhale 1 puff into the lungs daily.    [provider]  ?zolpidem (AMBIEN) 10 MG tablet Take 10 mg by mouth at bedtime.    [provider]  ?   ? ?Allergies    ?Morphine and related and Penicillins   ? ?Review of Systems   ?Review of Systems ? ?Physical Exam ?Updated Vital Signs ?BP 114/82   Pulse (!) 58   Temp 97.8 ?F (36.6 ?C) (Oral)   Resp 20   Ht 6' (1.829 m)   Wt 75.8 kg   SpO2 99%   BMI 22.65 kg/m?  ?Physical Exam ?Vitals and nursing note reviewed. Exam conducted with a chaperone present.  ?Constitutional:   ?   General: He is not in acute distress. ?   Appearance: Normal appearance.  ?HENT:  ?   Head: Normocephalic and atraumatic.  ?Eyes:  ?   General: No scleral icterus. ?   Extraocular Movements: Extraocular movements intact.  ?   Pupils: Pupils are equal, round, and reactive to light.  ?Cardiovascular:  ?   Pulses: Normal pulses.  ?Musculoskeletal:     ?   General: Swelling and tenderness present.   ?   Comments: Tolerates passive range of motion without any difficulty.  Slight effusion, no overlying skin discoloration.  No laxity.    ?Skin: ?   Capillary Refill: Capillary refill takes less than 2 seconds.  ?   Coloration: Skin is not jaundiced.  ?Neurological:  ?   Mental Status: He is alert. Mental status is at baseline.  ?  Coordination: Coordination normal.  ? ? ?ED Results / Procedures / Treatments   ?Labs ?(all labs ordered are listed, but only abnormal results are displayed) ?Labs Reviewed - No data to display ? ?EKG ?None ? ?Radiology ?DG Knee Complete 4 Views Right ? ?Result Date: 02/21/2022 ?CLINICAL DATA:  Acute on chronic right knee pain after fall yesterday. EXAM: RIGHT KNEE - COMPLETE 4+ VIEW COMPARISON:  Right knee x-rays dated July 29, 2020, and December 06, 2018. FINDINGS: No acute fracture or dislocation. Old healed fracture deformity of the proximal tibia. Small joint effusion. Mild medial and moderate lateral compartment joint space narrowing, progressed since 2020. Mildly worsened lateral subluxation of the proximal tibia with respect to the distal femur. Soft tissues are unremarkable. IMPRESSION: 1. No acute osseous abnormality. Old healed fracture of the proximal tibia. 2. 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 3. Small joint effusion. Electronically Signed   By: Obie Dredge M.D.   On: 02/21/2022 16:05   ? ?Procedures ?Procedures  ? ? ?Medications Ordered in ED ?Medications  ?ketorolac (TORADOL) injection 60 mg (60 mg Intramuscular Given 02/21/22 1605)  ?HYDROcodone-acetaminophen (NORCO/VICODIN) 5-325 MG per tablet 1 tablet (1 tablet Oral Given 02/21/22 1624)  ?ondansetron (ZOFRAN-ODT) disintegrating tablet 8 mg (8 mg Oral Given 02/21/22 1624)  ? ? ?ED Course/ Medical Decision Making/ A&P ?  ?                        ?Medical Decision Making ?Amount and/or Complexity of Data Reviewed ?Radiology: ordered. ? ?Risk ?Prescription drug  management. ? ? ?This is a 75 oh presenting with right knee pain. Differential diagnosis includes but is not limited to septic joint, dislocation, fracture, gout, muscle strain ? ?I ordered and viewed xray of right knee. No acu

## 2022-02-21 NOTE — ED Triage Notes (Signed)
Right knee pain, fell yesterday ?

## 2022-02-21 NOTE — ED Notes (Signed)
DSS caseworker states that patient is not to leave ED unless with group home staff or DSS employee. Provided number to group home director Synetta Fail [phone].) ?

## 2022-02-21 NOTE — ED Notes (Signed)
[name], [place] [employer] called and stated that they would assume cost of EMS transport for patient to be transported to [facility] Group home.  ?

## 2022-02-21 NOTE — ED Notes (Signed)
Pt in hall, cussing at staff saying "15 more minutes and I'm getting the fuck out of here". Charge nurse informed pt that he was still waiting for transport and he was not allowed to leave as he is a ward of the state. Security at bedside.  ?

## 2022-02-21 NOTE — ED Notes (Signed)
Have attempted to call group home staff without success.  ?

## 2022-02-21 NOTE — Discharge Instructions (Signed)
Your x-ray did not show any obvious break, he can wear the brace at home.  Take Tylenol for the pain, follow-up with orthopedic if the pain continues. ?

## 2022-02-22 NOTE — ED Notes (Signed)
Pt appears to be sleeping, RR even and unlabored, NAD noted, safety in place, pt in view of staff members ?

## 2022-02-22 NOTE — ED Notes (Signed)
Pt continues to rest, RR even and unlabored, NAD noted, safety in place, pt in view of staff members ?

## 2022-02-22 NOTE — ED Notes (Signed)
EMS transport in route  ?

## 2022-02-22 NOTE — ED Notes (Signed)
[facility] arrived to transport pt ?

## 2022-03-08 ENCOUNTER — Ambulatory Visit (HOSPITAL_COMMUNITY)
Admission: RE | Admit: 2022-03-08 | Discharge: 2022-03-08 | Disposition: A | Discharge status: Home / Self Care | Payer: Medicare Other | Source: Ambulatory Visit | Attending: Gerontology | Admitting: Gerontology

## 2022-03-08 ENCOUNTER — Other Ambulatory Visit (HOSPITAL_COMMUNITY): Payer: Self-pay | Admitting: Gerontology

## 2022-03-08 ENCOUNTER — Other Ambulatory Visit (HOSPITAL_COMMUNITY)
Admission: RE | Admit: 2022-03-08 | Discharge: 2022-03-08 | Disposition: A | Discharge status: Home / Self Care | Payer: Medicare Other | Source: Ambulatory Visit | Attending: Internal Medicine | Admitting: Internal Medicine

## 2022-03-08 DIAGNOSIS — R569 Unspecified convulsions: Secondary | ICD-10-CM | POA: Insufficient documentation

## 2022-03-08 DIAGNOSIS — Z0001 Encounter for general adult medical examination with abnormal findings: Secondary | ICD-10-CM | POA: Insufficient documentation

## 2022-03-08 DIAGNOSIS — Z1159 Encounter for screening for other viral diseases: Secondary | ICD-10-CM | POA: Diagnosis present

## 2022-03-08 DIAGNOSIS — I509 Heart failure, unspecified: Secondary | ICD-10-CM | POA: Insufficient documentation

## 2022-03-08 DIAGNOSIS — M25561 Pain in right knee: Secondary | ICD-10-CM

## 2022-03-08 DIAGNOSIS — K219 Gastro-esophageal reflux disease without esophagitis: Secondary | ICD-10-CM | POA: Diagnosis present

## 2022-03-08 DIAGNOSIS — Z79899 Other long term (current) drug therapy: Secondary | ICD-10-CM | POA: Insufficient documentation

## 2022-03-08 DIAGNOSIS — J449 Chronic obstructive pulmonary disease, unspecified: Secondary | ICD-10-CM | POA: Diagnosis present

## 2022-03-08 LAB — BASIC METABOLIC PANEL
Anion gap: 7 (ref 5–15)
BUN: <5 mg/dL — ABNORMAL LOW (ref 6–20)
CO2: 29 mmol/L (ref 22–32)
Calcium: 9.4 mg/dL (ref 8.9–10.3)
Chloride: 98 mmol/L (ref 98–111)
Creatinine, Ser: 0.86 mg/dL (ref 0.61–1.24)
GFR, Estimated: >60 mL/min (ref >60)
Glucose, Bld: 110 mg/dL — ABNORMAL HIGH (ref 70–99)
Potassium: 5.2 mmol/L — ABNORMAL HIGH (ref 3.5–5.1)
Sodium: 134 mmol/L — ABNORMAL LOW (ref 135–145)

## 2022-03-08 LAB — HEPATIC FUNCTION PANEL
ALT: 19 U/L (ref 0–44)
AST: 22 U/L (ref 15–41)
Albumin: 4.7 g/dL (ref 3.5–5.0)
Alkaline Phosphatase: 102 U/L (ref 38–126)
Bilirubin, Direct: 0.2 mg/dL (ref 0.0–0.2)
Indirect Bilirubin: 0.6 mg/dL (ref 0.3–0.9)
Total Bilirubin: 0.8 mg/dL (ref 0.3–1.2)
Total Protein: 7.7 g/dL (ref 6.5–8.1)

## 2022-03-08 LAB — CBC WITH DIFFERENTIAL/PLATELET
Abs Immature Granulocytes: 0.02 10*3/uL (ref 0.00–0.07)
Basophils Absolute: 0.1 10*3/uL (ref 0.0–0.1)
Basophils Relative: 1 %
Eosinophils Absolute: 0.1 10*3/uL (ref 0.0–0.5)
Eosinophils Relative: 2 %
HCT: 47.8 % (ref 39.0–52.0)
Hemoglobin: 16 g/dL (ref 13.0–17.0)
Immature Granulocytes: 0 %
Lymphocytes Relative: 28 %
Lymphs Abs: 1.4 10*3/uL (ref 0.7–4.0)
MCH: 32.6 pg (ref 26.0–34.0)
MCHC: 33.5 g/dL (ref 30.0–36.0)
MCV: 97.4 fL (ref 80.0–100.0)
Monocytes Absolute: 0.4 10*3/uL (ref 0.1–1.0)
Monocytes Relative: 8 %
Neutro Abs: 3.1 10*3/uL (ref 1.7–7.7)
Neutrophils Relative %: 61 %
Platelets: 310 10*3/uL (ref 150–400)
RBC: 4.91 MIL/uL (ref 4.22–5.81)
RDW: 15.7 % — ABNORMAL HIGH (ref 11.5–15.5)
WBC: 5.1 10*3/uL (ref 4.0–10.5)
nRBC: 0 % (ref 0.0–0.2)

## 2022-03-08 LAB — LIPID PANEL
Cholesterol: 126 mg/dL (ref 0–200)
HDL: 44 mg/dL (ref >40)
LDL Cholesterol: 68 mg/dL (ref 0–99)
Total CHOL/HDL Ratio: 2.9 RATIO
Triglycerides: 69 mg/dL (ref <150)
VLDL: 14 mg/dL (ref 0–40)

## 2022-03-08 LAB — HEPATITIS C ANTIBODY: HCV Ab: NONREACTIVE

## 2022-03-24 ENCOUNTER — Encounter (INDEPENDENT_AMBULATORY_CARE_PROVIDER_SITE_OTHER): Payer: Self-pay | Admitting: Gastroenterology

## 2022-03-24 ENCOUNTER — Ambulatory Visit (HOSPITAL_COMMUNITY)
Admission: RE | Admit: 2022-03-24 | Discharge: 2022-03-24 | Disposition: A | Discharge status: Home / Self Care | Payer: Medicare Other | Source: Ambulatory Visit | Attending: Internal Medicine | Admitting: Internal Medicine

## 2022-03-24 ENCOUNTER — Ambulatory Visit (INDEPENDENT_AMBULATORY_CARE_PROVIDER_SITE_OTHER): Payer: Medicare Other | Admitting: Gastroenterology

## 2022-03-24 VITALS — BP 125/85 | HR 58 | Temp 98.4°F | Ht 71.0 in | Wt 163.7 lb

## 2022-03-24 DIAGNOSIS — I509 Heart failure, unspecified: Secondary | ICD-10-CM | POA: Diagnosis present

## 2022-03-24 DIAGNOSIS — R1310 Dysphagia, unspecified: Secondary | ICD-10-CM | POA: Diagnosis not present

## 2022-03-24 DIAGNOSIS — K219 Gastro-esophageal reflux disease without esophagitis: Secondary | ICD-10-CM | POA: Diagnosis not present

## 2022-03-24 MED ORDER — PANTOPRAZOLE SODIUM 40 MG PO TBEC: 40.0000 mg | DELAYED_RELEASE_TABLET | Freq: Two times a day (BID) | ORAL | 1 refills | Status: DC

## 2022-03-24 NOTE — Patient Instructions (Signed)
I have sent PROTONIX 40mg  to your pharmacy ?Please take this 30 minutes prior to breakfast and 30 minutes prior to dinner ?Avoid greasy, spicy, fried, citrus foods, and be mindful that caffeine, carbonated drinks, chocolate and alcohol can increase reflux symptoms ?Stay upright 2-3 hours after eating, prior to lying down and avoid eating late in the evenings. ?We will get you scheduled for upper endoscopy for further evaluation of your symptoms ?Make sure you are chewing thoroughly and avoiding, thicker, dryer foods, taking sips of liquids between bites ?If you change your mind regarding colonoscopy, please let me know ? ?Follow up 4 months ?

## 2022-03-24 NOTE — Progress Notes (Signed)
? ?Referring Provider: Dairl PonderNjihi, Oscar, NP ?Primary Care Physician:  Benetta SparFanta, Tesfaye Demissie, MD ?Primary GI Physician: New ? ?Chief Complaint  ?Patient presents with  ? Hospitalization Follow-up  ?  Patient states he is having issues with his GERD he has been taking Esomeprazole 40 mg once per day for twenty years or more. He says it is not working any longer.  ? ?HPI:   ?[name] is a 46 y.o. male with past medical history of anxiety, arthritis, asthma, CHF, COPD, depression, GERD, HTN.  ? ?Patient presenting today as a new patient for GERD ? ?States that he has had GERD for 20 years, he reports that he has been on nexium 40mg  once daily for many years, tried omeprazole and prilosec in the past without good result. States he has heartburn and acid regurgitation, even sometimes with a glass of water he may have heartburn. Symptoms started flaring up around 3 weeks ago. States that he had no new medications at that times, actually had a few medications stopped. Diet is well rounded, symptoms occur with most anything he eats or drinks. Also endorses dysphagia fairly often, this occurs with both solids and pills for the past month, sometimes happens with liquids. He sometimes has to cough the substance back up. This is occurring maybe 2-3x/week. Denies nausea or vomiting. Denies cough or sore throat. Denies abdominal pain. Typically has a Bm everyday, rare constipation or diarrhea. Denies rectal bleeding or melena.  ? ?NSAID use:no NSAID use ?Social hx: smokes about 1/2 PPD, no etoh ?Fam hx:no crc or liver disease ? ?Last Colonoscopy:approx 10 years ago, diverticulosis ?Last Endoscopy:approx 10 years ago  ? ?Recommendations:  ? ? ?Past Medical History:  ?Diagnosis Date  ? Anxiety   ? 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 Asthma   ? CHF (congestive heart failure) (HCC)   ? COPD (chronic obstructive pulmonary disease) (HCC)   ? Depression   ? Encephalopathy   ? GERD (gastroesophageal reflux disease)   ? Hypertension   ?  Hyponatremia 06/2016  ? Pneumonia 2017  ? Renal disorder   ? kidney injury  ? ? ?Past Surgical History:  ?Procedure Laterality Date  ? COLONOSCOPY    ? ESOPHAGOGASTRODUODENOSCOPY    ? EXTERNAL FIXATION LEG Right 12/01/2018  ? Procedure: EXTERNAL FIXATION LEG;  Surgeon: Juanell FairlyKrasinski, Kevin, MD;  Location: [facility];  Service: Orthopedics;  Laterality: Right;  ? EXTERNAL FIXATION REMOVAL Right 12/06/2018  ? Procedure: REMOVAL EXTERNAL FIXATION LEG;  Surgeon: Roby LoftsHaddix, Kevin P, MD;  Location: [facility];  Service: Orthopedics;  Laterality: Right;  ? FINGER SURGERY Left   ? 5th digit-Fracture-"worse now than before"  ? HARDWARE REMOVAL Right 12/06/2019  ? Procedure: HARDWARE REMOVAL RIGHT TIBIAL PLATEAU;  Surgeon: Roby LoftsHaddix, Kevin P, MD;  Location: [facility] OR;  Service: Orthopedics;  Laterality: Right;  ? ORIF TIBIA PLATEAU Right 12/06/2018  ? Procedure: OPEN REDUCTION INTERNAL FIXATION (ORIF) TIBIAL PLATEAU;  Surgeon: Roby LoftsHaddix, Kevin P, MD;  Location: [facility] OR;  Service: Orthopedics;  Laterality: Right;  ? PLEURAL EFFUSION DRAINAGE Right 01/02/2016  ? Procedure: DRAINAGE OF PLEURAL EFFUSION;  Surgeon: Loreli SlotSteven C Hendrickson, MD;  [facility];  Service: Thoracic;  Laterality: Right;  ? VIDEO ASSISTED THORACOSCOPY (VATS)/DECORTICATION Right 01/02/2016  ? Procedure: VIDEO ASSISTED THORACOSCOPY (VATS)/DECORTICATION;  Surgeon: Loreli SlotSteven C Hendrickson, MD;  [facility];  Service: Thoracic;  Laterality: Right;  ? VIDEO BRONCHOSCOPY N/A 01/02/2016  ? Procedure: VIDEO BRONCHOSCOPY;  Surgeon: Loreli SlotSteven C Hendrickson, MD;  Location: [facility] OR;  Service: Thoracic;  Laterality: N/A;  ? ? ?Current Outpatient Medications  ?Medication Sig Dispense Refill  ? albuterol (PROAIR HFA) 108 (90 Base) MCG/ACT inhaler Inhale into the lungs every 6 (six) hours as needed for wheezing or shortness of breath.    ? albuterol (PROVENTIL) (2.5 MG/3ML) 0.083% nebulizer solution Take 2.5 mg by nebulization every 6 (six) hours as needed for wheezing or shortness of breath.    ? Calcium  Carbonate (CALCIUM 600 PO) Take 600 mg by mouth 2 (two) times daily.    ? clonazePAM (KLONOPIN) 1 MG tablet Take 1 mg by mouth 2 (two) times daily.    ? escitalopram (LEXAPRO) 20 MG tablet Take 20 mg by mouth daily.     ? esomeprazole (NEXIUM) 40 MG capsule Take 40 mg by mouth daily.     ? fluticasone-salmeterol (ADVAIR) 250-50 MCG/ACT AEPB Inhale 1 puff into the lungs in the morning and at bedtime.    ? furosemide (LASIX) 20 MG tablet Take 1 tablet (20 mg total) by mouth daily. 90 tablet 3  ? losartan (COZAAR) 25 MG tablet Take 25 mg by mouth daily.    ? methocarbamol (ROBAXIN) 750 MG tablet Take 750 mg by mouth 2 (two) times daily.    ? primidone (MYSOLINE) 250 MG tablet Take 250 mg by mouth 3 (three) times daily. Take with 50 mg to equal 300 mg 3 times daily    ? primidone (MYSOLINE) 50 MG tablet Take 50 mg by mouth at bedtime. Take with 250 mg to equal 300 mg 3 times daily    ? zolpidem (AMBIEN) 5 MG tablet Take 5 mg by mouth at bedtime.    ? ?No current facility-administered medications for this visit.  ? ? ?Allergies as of 03/24/2022 - Review Complete 03/24/2022  ?Allergen Reaction Noted  ? Morphine and related  11/21/2018  ? Penicillins Nausea And Vomiting 03/06/2016  ? ? ?Family History  ?Problem Relation Age of Onset  ? CAD Mother 99  ?     Sudden death  ? Diabetes Mellitus II Father   ? Prostate cancer Paternal Grandfather   ? ? ?Social History  ? ?Socioeconomic History  ? Marital status: Single  ?  Spouse name: Not on file  ? Number of children: Not on file  ? Years of education: Not on file  ? Highest education level: Not on file  ?Occupational History  ? Not on file  ?Tobacco Use  ? Smoking status: Every Day  ?  Packs/day: 0.25  ?  Years: 10.00  ?  Pack years: 2.50  ?  Types: Pipe, Cigarettes  ? Smokeless tobacco: Former  ?  Quit date: 2013  ?Vaping Use  ? Vaping Use: Never used  ?Substance and Sexual Activity  ? Alcohol use: No  ?  Comment: pt states he no longer drinks 2013  ? Drug use: No  ? Sexual  activity: Not Currently  ?Other Topics Concern  ? Not on file  ?Social History Narrative  ? Lives in a group home.  Just got married.    ? ?Social Determinants of Health  ? ?Financial Resource Strain: Not on file  ?Food Insecurity: Not on file  ?Transportation Needs: Not on file  ?Physical Activity: Not on file  ?Stress: Not on file  ?Social Connections: Not on file  ? ?Review of systems ?General: negative for malaise, night sweats, fever, chills, weight loss ?Neck: Negative for lumps, goiter, pain and significant neck swelling ?Resp: Negative for cough, wheezing, dyspnea  at rest ?CV: Negative for chest pain, leg swelling, palpitations, orthopnea ?GI: denies melena, hematochezia, nausea, vomiting, diarrhea, constipation, odyonophagia, early satiety or unintentional weight loss. +dysphagia +GERD ?MSK: Negative for joint pain or swelling, back pain, and muscle pain. ?Derm: Negative for itching or rash ?Psych: Denies depression, anxiety, memory loss, confusion. No homicidal or suicidal ideation.  ?Heme: Negative for prolonged bleeding, bruising easily, and swollen nodes. ?Endocrine: Negative for cold or heat intolerance, polyuria, polydipsia and goiter. ?Neuro: negative for tremor, gait imbalance, syncope and seizures. ?The remainder of the review of systems is noncontributory. ? ?Physical Exam: ?BP 125/85 (BP Location: Left Arm, Patient Position: Sitting, Cuff Size: Large)   Pulse (!) 58   Temp 98.4 ?F (36.9 ?C) (Oral)   Ht 5\' 11"  (1.803 m)   Wt 163 lb 11.2 oz (74.3 kg)   BMI 22.83 kg/m?  ?General:   Alert and oriented. No distress noted. Pleasant and cooperative.  ?Head:  Normocephalic and atraumatic. ?Eyes:  Conjuctiva clear without scleral icterus. ?Mouth:  Oral mucosa pink and moist. Good dentition. No lesions. ?Heart: Normal rate and rhythm, s1 and s2 heart sounds present.  ?Lungs: Clear lung sounds in all lobes. Respirations equal and unlabored. ?Abdomen:  +BS, soft, non-tender and non-distended. No rebound  or guarding. No HSM or masses noted. ?Derm: No palmar erythema or jaundice ?Msk:  Symmetrical without gross deformities. Normal posture. ?Extremities:  Without edema. ?Neurologic:  Alert and  oriented x4 ?P

## 2022-03-29 ENCOUNTER — Ambulatory Visit (INDEPENDENT_AMBULATORY_CARE_PROVIDER_SITE_OTHER): Payer: Medicare Other | Admitting: Orthopedic Surgery

## 2022-03-29 ENCOUNTER — Encounter: Payer: Self-pay | Admitting: Orthopedic Surgery

## 2022-03-29 DIAGNOSIS — M1731 Unilateral post-traumatic osteoarthritis, right knee: Secondary | ICD-10-CM

## 2022-03-29 DIAGNOSIS — M25361 Other instability, right knee: Secondary | ICD-10-CM

## 2022-03-29 NOTE — Patient Instructions (Addendum)
Instructions Following Joint Injections ? ?In clinic today, you received an injection in one of your joints (sometimes more than one).  Occasionally, you can have some pain at the injection site, this is normal.  You can place ice at the injection site, or take over-the-counter medications such as Tylenol (acetaminophen) or Advil (ibuprofen).  Please follow all directions listed on the bottle. ? ?If your joint (knee or shoulder) becomes swollen, red or very painful, please contact the clinic for additional assistance.  ? ?Two medications were injected, including lidocaine and a steroid (often referred to as cortisone).  Lidocaine is effective almost immediately but wears off quickly.  However, the steroid can take a few days to improve your symptoms.  In some cases, it can make your pain worse for a couple of days.  Do not be concerned if this happens as it is common.  You can apply ice or take some over-the-counter medications as needed.  ? ? ?Naproxen or Ibuprofen as needed for pain ? ? ? ?Knee Exercises ? ?Ask your health care provider which exercises are safe for you. Do exercises exactly as told by your health care provider and adjust them as directed. It is normal to feel mild stretching, pulling, tightness, or discomfort as you do these exercises. Stop right away if you feel sudden pain or your pain gets worse. Do not begin these exercises until told by your health care provider. ? ?Stretching and range-of-motion exercises ?These exercises warm up your muscles and joints and improve the movement and flexibility of your knee. These exercises also help to relieve pain and swelling. ? ?Knee extension, prone ?Lie on your abdomen (prone position) on a bed. ?Place your left / right knee just beyond the edge of the surface so your knee is not on the bed. You can put a towel under your left / right thigh just above your kneecap for comfort. ?Relax your leg muscles and allow gravity to straighten your knee  (extension). You should feel a stretch behind your left / right knee. ?Hold this position for 10 seconds. ?Scoot up so your knee is supported between repetitions. ?Repeat 10 times. Complete this exercise 3-4 times per week. ?    ?Knee flexion, active ?Lie on your back with both legs straight. If this causes back discomfort, bend your left / right knee so your foot is flat on the floor. ?Slowly slide your left / right heel back toward your buttocks. Stop when you feel a gentle stretch in the front of your knee or thigh (flexion). ?Hold this position for 10 seconds. ?Slowly slide your left / right heel back to the starting position. ?Repeat 10 times. Complete this exercise 3-4 times per week. ?  ?   ?Quadriceps stretch, prone ?Lie on your abdomen on a firm surface, such as a bed or padded floor. ?Bend your left / right knee and hold your ankle. If you cannot reach your ankle or pant leg, loop a belt around your foot and grab the belt instead. ?Gently pull your heel toward your buttocks. Your knee should not slide out to the side. You should feel a stretch in the front of your thigh and knee (quadriceps). ?Hold this position for 10 seconds. ?Repeat 10 times. Complete this exercise 3-4 times per week. ?  ?   ?Hamstring, supine ?Lie on your back (supine position). ?Loop a belt or towel over the ball of your left / right foot. The ball of your foot is on the walking  surface, right under your toes. ?Straighten your left / right knee and slowly pull on the belt to raise your leg until you feel a gentle stretch behind your knee (hamstring). ?Do not let your knee bend while you do this. ?Keep your other leg flat on the floor. ?Hold this position for 10 seconds. ?Repeat 10 times. Complete this exercise 3-4 times per week. ? ? ?Strengthening exercises ?These exercises build strength and endurance in your knee. Endurance is the ability to use your muscles for a long time, even after they get tired. ? ?Quadriceps, isometric ?This  exercise stretches the muscles in front of your thigh (quadriceps) without moving your knee joint (isometric). ?Lie on your back with your left / right leg extended and your other knee bent. Put a rolled towel or small pillow under your knee if told by your health care provider. ?Slowly tense the muscles in the front of your left / right thigh. You should see your kneecap slide up toward your hip or see increased dimpling just above the knee. This motion will push the back of the knee toward the floor. ?For 10 seconds, hold the muscle as tight as you can without increasing your pain. ?Relax the muscles slowly and completely. ?Repeat 10 times. Complete this exercise 3-4 times per week. ?.  ?   ?Straight leg raises ?This exercise stretches the muscles in front of your thigh (quadriceps) and the muscles that move your hips (hip flexors). ?Lie on your back with your left / right leg extended and your other knee bent. ?Tense the muscles in the front of your left / right thigh. You should see your kneecap slide up or see increased dimpling just above the knee. Your thigh may even shake a bit. ?Keep these muscles tight as you raise your leg 4-6 inches (10-15 cm) off the floor. Do not let your knee bend. ?Hold this position for 10 seconds. ?Keep these muscles tense as you lower your leg. ?Relax your muscles slowly and completely after each repetition. ?Repeat 10 times. Complete this exercise 3-4 times per week. ? ?Hamstring, isometric ?Lie on your back on a firm surface. ?Bend your left / right knee about 30 degrees. ?Dig your left / right heel into the surface as if you are trying to pull it toward your buttocks. Tighten the muscles in the back of your thighs (hamstring) to "dig" as hard as you can without increasing any pain. ?Hold this position for 10 seconds. ?Release the tension gradually and allow your muscles to relax completely for __________ seconds after each repetition. ?Repeat 10 times. Complete this exercise  3-4 times per week. ? ?Hamstring curls ?If told by your health care provider, do this exercise while wearing ankle weights. Begin with 5 lb weights. Then increase the weight by 1 lb (0.5 kg) increments. You can also use an exercise band ?Lie on your abdomen with your legs straight. ?Bend your left / right knee as far as you can without feeling pain. Keep your hips flat against the floor. ?Hold this position for 10 seconds. ?Slowly lower your leg to the starting position. ?Repeat 10 times. Complete this exercise 3-4 times per week. ?  ?   ?Squats ?This exercise strengthens the muscles in front of your thigh and knee (quadriceps). ?Stand in front of a table, with your feet and knees pointing straight ahead. You may rest your hands on the table for balance but not for support. ?Slowly bend your knees and lower your hips like  you are going to sit in a chair. ?Keep your weight over your heels, not over your toes. ?Keep your lower legs upright so they are parallel with the table legs. ?Do not let your hips go lower than your knees. ?Do not bend lower than told by your health care provider. ?If your knee pain increases, do not bend as low. ?Hold the squat position for 10 seconds. ?Slowly push with your legs to return to standing. Do not use your hands to pull yourself to standing. ?Repeat 10 times. Complete this exercise 3-4 times per week ?. ?    ?Wall slides ?This exercise strengthens the muscles in front of your thigh and knee (quadriceps). ?Lean your back against a smooth wall or door, and walk your feet out 18-24 inches (46-61 cm) from it. ?Place your feet hip-width apart. ?Slowly slide down the wall or door until your knees bend 90 degrees. Keep your knees over your heels, not over your toes. Keep your knees in line with your hips. ?Hold this position for 10 seconds. ?Repeat 10 times. Complete this exercise 3-4 times per week. ?  ?   ?Straight leg raises ?This exercise strengthens the muscles that rotate the leg at  the hip and move it away from your body (hip abductors). ?Lie on your side with your left / right leg in the top position. Lie so your head, shoulder, knee, and hip line up. You may bend your bottom knee to help y

## 2022-03-30 ENCOUNTER — Encounter: Payer: Self-pay | Admitting: Orthopedic Surgery

## 2022-03-30 NOTE — Progress Notes (Signed)
New Patient Visit ? ?Assessment: ?[name] is a 46 y.o. male with the following: ?1. Post-traumatic osteoarthritis of right knee ?2. Knee instability, right ? ?Plan: ?[name] has posttraumatic right knee osteoarthritis.  This is likely contributing to his pain.  On physical exam, he also has significant laxity including a 2+ Lachman, and some laxity to varus stress.  In addition, he hyperextends his knee at least 30 degrees.  My focus at this point is trying to improve his symptoms.  In addition, I provided him with a brace to hopefully improve his overall stability, and making it easier for him to walk.  I have also recommended a cane.  We discussed proceeding with a steroid injection, and he is interested.  This was completed without issues in clinic today.  If he continues to have issues, we will likely have to refer him to a total knee arthroplasty specialist as he may require a constrained implant.  All questions were answered, and he is amenable to this plan.  Follow-up as needed. ? ?Procedure note injection Right knee joint ?  ?Verbal consent was obtained to inject the right knee joint  ?Timeout was completed to confirm the site of injection.  The skin was prepped with alcohol and ethyl chloride was sprayed at the injection site.  ?A 21-gauge needle was used to inject 40 mg of Depo-Medrol and 1% lidocaine (3 cc) into the right knee using an anterolateral approach.  ?There were no complications. A sterile bandage was applied. ? ? ?Follow-up: ?Return if symptoms worsen or fail to improve. ? ?Subjective: ? ?Chief Complaint  ?Patient presents with  ? Knee Pain  ?  RT knee/ painful ~3 years  ? ? ?History of Present Illness: ?[name] is a 46 y.o. male who has been referred by  Avon Gully, MD for evaluation of right knee pain.  He sustained a significant injury to his right knee approximately 3 years ago.  He had a bicondylar tibial plateau fracture, with likely knee dislocation.  This injury was  initially Ex-Fix, then it was fixed with multiple plates and screws.  The plates and screws were subsequently removed.  He has had no issues with his surgical incisions.  However, he continues to have pain in the right knee.  He also notes that he has difficulty walking.  He has popping and locking sensations in his right knee.  In addition, his knee buckles.  He walks currently without an assistive device.  He has tried medications including Tylenol and ibuprofen without improvement in his symptoms.  When he was having his surgeries initially, he was told that he may have to have a total knee replacement at some point in the near future. ? ? ?Review of Systems: ?No fevers or chills ?No numbness or tingling ?No chest pain ?No shortness of breath ?No bowel or bladder dysfunction ?No GI distress ?No headaches ? ? ?Medical History: ? ?Past Medical History:  ?Diagnosis Date  ? Anxiety   ? 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 Asthma   ? CHF (congestive heart failure) (HCC)   ? COPD (chronic obstructive pulmonary disease) (HCC)   ? Depression   ? Encephalopathy   ? GERD (gastroesophageal reflux disease)   ? Hypertension   ? Hyponatremia 06/2016  ? Pneumonia 2017  ? Renal disorder   ? kidney injury  ? ? ?Past Surgical History:  ?Procedure Laterality Date  ? COLONOSCOPY    ? ESOPHAGOGASTRODUODENOSCOPY    ? EXTERNAL  FIXATION LEG Right 12/01/2018  ? Procedure: EXTERNAL FIXATION LEG;  Surgeon: Juanell FairlyKrasinski, Kevin, MD;  Location: [facility] OR[facility];  Service: Orthopedics;  Laterality: Right;  ? EXTERNAL FIXATION REMOVAL Right 12/06/2018  ? Procedure: REMOVAL EXTERNAL FIXATION LEG;  Surgeon: Roby LoftsHaddix, Kevin P, MD;  Location: [facility] OR;  Service: Orthopedics;  Laterality: Right;  ? FINGER SURGERY Left   ? 5th digit-Fracture-"worse now than before"  ? HARDWARE REMOVAL Right 12/06/2019  ? Procedure: HARDWARE REMOVAL RIGHT TIBIAL PLATEAU;  Surgeon: Roby LoftsHaddix, Kevin P, MD;  Location: [facility] OR;  Service: Orthopedics;  Laterality: Right;  ? ORIF TIBIA PLATEAU Right  12/06/2018  ? Procedure: OPEN REDUCTION INTERNAL FIXATION (ORIF) TIBIAL PLATEAU;  Surgeon: Roby LoftsHaddix, Kevin P, MD;  Location: [facility] OR;  Service: Orthopedics;  Laterality: Right;  ? PLEURAL EFFUSION DRAINAGE Right 01/02/2016  ? Procedure: DRAINAGE OF PLEURAL EFFUSION;  Surgeon: Loreli SlotSteven C Hendrickson, MD;  [facility];  Service: Thoracic;  Laterality: Right;  ? VIDEO ASSISTED THORACOSCOPY (VATS)/DECORTICATION Right 01/02/2016  ? Procedure: VIDEO ASSISTED THORACOSCOPY (VATS)/DECORTICATION;  Surgeon: Loreli SlotSteven C Hendrickson, MD;  [facility];  Service: Thoracic;  Laterality: Right;  ? VIDEO BRONCHOSCOPY N/A 01/02/2016  ? Procedure: VIDEO BRONCHOSCOPY;  Surgeon: Loreli SlotSteven C Hendrickson, MD;  [facility];  Service: Thoracic;  Laterality: N/A;  ? ? ?Family History  ?Problem Relation Age of Onset  ? CAD Mother 4360  ?     Sudden death  ? Diabetes Mellitus II Father   ? Prostate cancer Paternal Grandfather   ? ?Social History  ? ?Tobacco Use  ? Smoking status: Every Day  ?  Packs/day: 0.25  ?  Years: 10.00  ?  Pack years: 2.50  ?  Types: Pipe, Cigarettes  ? Smokeless tobacco: Former  ?  Quit date: 2013  ?Vaping Use  ? Vaping Use: Never used  ?Substance Use Topics  ? Alcohol use: No  ?  Comment: pt states he no longer drinks 2013  ? Drug use: No  ? ? ?Allergies  ?Allergen Reactions  ? Codeine Nausea Only  ? Ibuprofen Nausea Only  ? Morphine   ?  UNSPECIFIED REACTION - shaking, diaphoresis  ? Morphine And Related   ?  UNSPECIFIED REACTION - shaking, diaphoresis  ? Tramadol Nausea Only  ? Penicillins Nausea And Vomiting  ?  Did it involve swelling of the face/tongue/throat, SOB, or low BP? No ?Did it involve sudden or severe rash/hives, skin peeling, or any reaction on the inside of your mouth or nose? No ?Did you need to seek medical attention at a hospital or doctor's office? No ?When did it last happen?      10+years ?If all above answers are ?NO?, may proceed with cephalosporin use. ?  ? ? ?Current Meds  ?Medication Sig  ?  albuterol (PROVENTIL) (2.5 MG/3ML) 0.083% nebulizer solution Take 2.5 mg by nebulization every 6 (six) hours as needed for wheezing or shortness of breath.  ? albuterol (VENTOLIN HFA) 108 (90 Base) MCG/ACT inhaler Inhale into the lungs every 6 (six) hours as needed for wheezing or shortness of breath.  ? Calcium Carbonate (CALCIUM 600 PO) Take 600 mg by mouth 2 (two) times daily.  ? clonazePAM (KLONOPIN) 1 MG tablet Take 1 mg by mouth 2 (two) times daily.  ? escitalopram (LEXAPRO) 20 MG tablet Take 20 mg by mouth daily.   ? fluticasone-salmeterol (ADVAIR) 250-50 MCG/ACT AEPB Inhale 1 puff into the lungs in the morning and at bedtime.  ? furosemide (LASIX) 20 MG tablet Take  1 tablet (20 mg total) by mouth daily.  ? losartan (COZAAR) 25 MG tablet Take 25 mg by mouth daily.  ? methocarbamol (ROBAXIN) 750 MG tablet Take 750 mg by mouth 2 (two) times daily.  ? pantoprazole (PROTONIX) 40 MG tablet Take 1 tablet (40 mg total) by mouth 2 (two) times daily before a meal.  ? primidone (MYSOLINE) 250 MG tablet Take 250 mg by mouth 3 (three) times daily. Take with 50 mg to equal 300 mg 3 times daily  ? primidone (MYSOLINE) 50 MG tablet Take 50 mg by mouth at bedtime. Take with 250 mg to equal 300 mg 3 times daily  ? zolpidem (AMBIEN) 5 MG tablet Take 5 mg by mouth at bedtime.  ? ? ?Objective: ?There were no vitals taken for this visit. ? ?Physical Exam: ? ?General: Alert and oriented. and No acute distress.  Appears older than stated age ?Gait: Right sided antalgic gait.  Knee hyperextends with each step. ? ?Evaluation of the right knee demonstrates multiple well-healed surgical incisions.  He has atrophy of the right quadriceps.  Tenderness to palpation over the medial lateral joint lines.  Positive crepitus with range of motion testing.  Passively he hyperextends his knee by 30 degrees.  2+ Lachman.  He has laxity to varus stress.  No laxity to valgus stress.  No posterior sag of the tibia.  Toes are warm and  well-perfused. ? ? ? ?IMAGING: ?I personally reviewed images previously obtained in clinic ? ?X-rays of the right knee were previously obtained.  This demonstrates posttraumatic arthritis, with deformity of the proximal tib

## 2022-04-04 ENCOUNTER — Other Ambulatory Visit (INDEPENDENT_AMBULATORY_CARE_PROVIDER_SITE_OTHER): Payer: Self-pay

## 2022-04-04 DIAGNOSIS — R1310 Dysphagia, unspecified: Secondary | ICD-10-CM

## 2022-04-04 DIAGNOSIS — K219 Gastro-esophageal reflux disease without esophagitis: Secondary | ICD-10-CM

## 2022-04-05 ENCOUNTER — Encounter (INDEPENDENT_AMBULATORY_CARE_PROVIDER_SITE_OTHER): Payer: Self-pay

## 2022-04-21 ENCOUNTER — Other Ambulatory Visit (INDEPENDENT_AMBULATORY_CARE_PROVIDER_SITE_OTHER): Payer: Self-pay | Admitting: Gastroenterology

## 2022-04-29 ENCOUNTER — Ambulatory Visit (INDEPENDENT_AMBULATORY_CARE_PROVIDER_SITE_OTHER): Payer: Medicare Other | Admitting: Orthopedic Surgery

## 2022-04-29 ENCOUNTER — Encounter: Payer: Self-pay | Admitting: Orthopedic Surgery

## 2022-04-29 VITALS — Ht 71.0 in | Wt 163.0 lb

## 2022-04-29 DIAGNOSIS — M1731 Unilateral post-traumatic osteoarthritis, right knee: Secondary | ICD-10-CM | POA: Diagnosis not present

## 2022-04-29 DIAGNOSIS — M25361 Other instability, right knee: Secondary | ICD-10-CM

## 2022-04-29 NOTE — Progress Notes (Signed)
New Patient Visit  Assessment: [name] is a 46 y.o. male with the following: 1. Post-traumatic osteoarthritis of right knee 2. Knee instability, right  Plan: [name] has posttraumatic right knee osteoarthritis and knee instability.  Prior injection helped his pain for short period of time.  It is too soon to repeat an injection.  He was instructed on how to use his hinged knee brace more effectively.  We will place a referral for him to see Dr. Allie Bossier, for further evaluation left knee.  If he has issues scheduling an appointment, he should contact the clinic.  Follow-up as needed.  Follow-up: Return if symptoms worsen or fail to improve.  Subjective:  Chief Complaint  Patient presents with   Knee Pain    Rt knee pain shot only helped for a few days and now pain is worse than when it first started. Pt states he has fallen a couple times since he saw [place] last.     History of Present Illness: [name] is a 46 y.o. male who returns to clinic today for repeat evaluation of his right knee.  He was seen in clinic about a month ago.  We injected his right knee with steroid.  He states this improved his pain for just a few days.  Since then his pain is gotten worse.  He has tried wearing a brace that we recommended, but has had difficulty getting this to fit properly.  He notes he has fallen a couple times since we last saw him in clinic.   Review of Systems: No fevers or chills No numbness or tingling No chest pain No shortness of breath No bowel or bladder dysfunction No GI distress No headaches    Objective: Ht 5\' 11"  (1.803 m)   Wt 163 lb (73.9 kg)   BMI 22.73 kg/m   Physical Exam:  General: Alert and oriented. and No acute distress.  Appears older than stated age Gait: Right sided antalgic gait.  Knee hyperextends with each step.  Evaluation of the right knee demonstrates multiple well-healed surgical incisions.  He has atrophy of the right quadriceps.   Tenderness to palpation over the medial and lateral joint lines.  Positive crepitus with range of motion testing.  Passively he hyperextends his knee by 30 degrees.  2+ Lachman.  He has laxity to varus stress.  No laxity to valgus stress.  No posterior sag of the tibia.  Toes are warm and well-perfused.  When walking, he has a pronounced limp due to hyperextension of his right knee.    IMAGING: I personally reviewed images previously obtained in clinic  X-rays of the right knee were previously obtained.  This demonstrates posttraumatic arthritis, with deformity of the proximal tibia.  Overall, the knee looks to be reduced.  New Medications:  No orders of the defined types were placed in this encounter.     , MD  04/29/2022 2:22 PM

## 2022-05-06 ENCOUNTER — Encounter (HOSPITAL_COMMUNITY): Payer: Medicaid Other

## 2022-05-06 NOTE — Patient Instructions (Signed)
[name]  05/06/2022     @PREFPERIOPPHARMACY @   Your procedure is scheduled on  05/10/2022.   Report to 05/12/2022 at  1245  P.M.   Call this number if you have problems the morning of surgery:  [phone]   Remember:  Follow the diet instructions given to you by the office.    Use your inhalers before you come and bring your rescue inhaler with you.    Take these medicines the morning of surgery with A SIP OF WATER       clonazepam, lexapro, robaxin, protonix, mysoline.     Do not wear jewelry, make-up or nail polish.  Do not wear lotions, powders, or perfumes, or deodorant.  Do not shave 48 hours prior to surgery.  Men may shave face and neck.  Do not bring valuables to the hospital.  [facility] is not responsible for any belongings or valuables.  Contacts, dentures or bridgework may not be worn into surgery.  Leave your suitcase in the car.  After surgery it may be brought to your room.  For patients admitted to the hospital, discharge time will be determined by your treatment team.  Patients discharged the day of surgery will not be allowed to drive home and must have someone with them for 24 hours.    Special instructions:   DO NOT smoke tobacco or vape for 24 hours before your procedure.  Please read over the following fact sheets that you were given. Anesthesia Post-op Instructions and Care and Recovery After Surgery      Upper Endoscopy, Adult, Care After This sheet gives you information about how to care for yourself after your procedure. Your health care provider may also give you more specific instructions. If you have problems or questions, contact your health care provider. What can I expect after the procedure? After the procedure, it is common to have: A sore throat. Mild stomach pain or discomfort. Bloating. Nausea. Follow these instructions at home:  Follow instructions from your health care provider about what to eat or drink  after your procedure. Return to your normal activities as told by your health care provider. Ask your health care provider what activities are safe for you. Take over-the-counter and prescription medicines only as told by your health care provider. If you were given a sedative during the procedure, it can affect you for several hours. Do not drive or operate machinery until your health care provider says that it is safe. Keep all follow-up visits as told by your health care provider. This is important. Contact a health care provider if you have: A sore throat that lasts longer than one day. Trouble swallowing. Get help right away if: You vomit blood or your vomit looks like coffee grounds. You have: A fever. Bloody, black, or tarry stools. A severe sore throat or you cannot swallow. Difficulty breathing. Severe pain in your chest or abdomen. Summary After the procedure, it is common to have a sore throat, mild stomach discomfort, bloating, and nausea. If you were given a sedative during the procedure, it can affect you for several hours. Do not drive or operate machinery until your health care provider says that it is safe. Follow instructions from your health care provider about what to eat or drink after your procedure. Return to your normal activities as told by your health care provider. This information is not intended to replace advice given to you by your health care provider.  Make sure you discuss any questions you have with your health care provider. Document Revised: 09/13/2019 Document Reviewed: 04/09/2018 Elsevier Patient Education  2023 [employer] Monitored Anesthesia Care, Care After This sheet gives you information about how to care for yourself after your procedure. Your health care provider may also give you more specific instructions. If you have problems or questions, contact your health care provider. What can I expect after the procedure? After the procedure, it is  common to have: Tiredness. Forgetfulness about what happened after the procedure. Impaired judgment for important decisions. Nausea or vomiting. Some difficulty with balance. Follow these instructions at home: For the time period you were told by your health care provider:     Rest as needed. Do not participate in activities where you could fall or become injured. Do not drive or use machinery. Do not drink alcohol. Do not take sleeping pills or medicines that cause drowsiness. Do not make important decisions or sign legal documents. Do not take care of children on your own. Eating and drinking Follow the diet that is recommended by your health care provider. Drink enough fluid to keep your urine pale yellow. If you vomit: Drink water, juice, or soup when you can drink without vomiting. Make sure you have little or no nausea before eating solid foods. General instructions Have a responsible adult stay with you for the time you are told. It is important to have someone help care for you until you are awake and alert. Take over-the-counter and prescription medicines only as told by your health care provider. If you have sleep apnea, surgery and certain medicines can increase your risk for breathing problems. Follow instructions from your health care provider about wearing your sleep device: Anytime you are sleeping, including during daytime naps. While taking prescription pain medicines, sleeping medicines, or medicines that make you drowsy. Avoid smoking. Keep all follow-up visits as told by your health care provider. This is important. Contact a health care provider if: You keep feeling nauseous or you keep vomiting. You feel light-headed. You are still sleepy or having trouble with balance after 24 hours. You develop a rash. You have a fever. You have redness or swelling around the IV site. Get help right away if: You have trouble breathing. You have new-onset confusion at  home. Summary For several hours after your procedure, you may feel tired. You may also be forgetful and have poor judgment. Have a responsible adult stay with you for the time you are told. It is important to have someone help care for you until you are awake and alert. Rest as told. Do not drive or operate machinery. Do not drink alcohol or take sleeping pills. Get help right away if you have trouble breathing, or if you suddenly become confused. This information is not intended to replace advice given to you by your health care provider. Make sure you discuss any questions you have with your health care provider. Document Revised: 10/12/2021 Document Reviewed: 10/10/2019 Elsevier Patient Education  2023 [employer].

## 2022-05-09 ENCOUNTER — Encounter (HOSPITAL_COMMUNITY)
Admission: RE | Admit: 2022-05-09 | Discharge: 2022-05-09 | Disposition: A | Discharge status: Home / Self Care | Payer: Medicare Other | Source: Ambulatory Visit | Attending: Gastroenterology | Admitting: Gastroenterology

## 2022-05-09 VITALS — BP 132/84 | HR 62 | Temp 97.8°F | Resp 18 | Ht 71.0 in | Wt 170.0 lb

## 2022-05-09 DIAGNOSIS — K219 Gastro-esophageal reflux disease without esophagitis: Secondary | ICD-10-CM | POA: Diagnosis not present

## 2022-05-09 DIAGNOSIS — Z01818 Encounter for other preprocedural examination: Secondary | ICD-10-CM | POA: Insufficient documentation

## 2022-05-09 DIAGNOSIS — R1310 Dysphagia, unspecified: Secondary | ICD-10-CM | POA: Insufficient documentation

## 2022-05-09 DIAGNOSIS — I5032 Chronic diastolic (congestive) heart failure: Secondary | ICD-10-CM | POA: Insufficient documentation

## 2022-05-09 DIAGNOSIS — Z720 Tobacco use: Secondary | ICD-10-CM | POA: Insufficient documentation

## 2022-05-09 LAB — BASIC METABOLIC PANEL
Anion gap: 6 (ref 5–15)
BUN: 5 mg/dL — ABNORMAL LOW (ref 6–20)
CO2: 27 mmol/L (ref 22–32)
Calcium: 8.9 mg/dL (ref 8.9–10.3)
Chloride: 105 mmol/L (ref 98–111)
Creatinine, Ser: 0.64 mg/dL (ref 0.61–1.24)
GFR, Estimated: >60 mL/min (ref >60)
Glucose, Bld: 56 mg/dL — ABNORMAL LOW (ref 70–99)
Potassium: 3.6 mmol/L (ref 3.5–5.1)
Sodium: 138 mmol/L (ref 135–145)

## 2022-05-10 ENCOUNTER — Encounter (HOSPITAL_COMMUNITY)
Admission: RE | Disposition: A | Discharge status: Home / Self Care | Payer: Self-pay | Source: Home / Self Care | Attending: Gastroenterology

## 2022-05-10 ENCOUNTER — Ambulatory Visit (HOSPITAL_BASED_OUTPATIENT_CLINIC_OR_DEPARTMENT_OTHER): Payer: Medicare Other | Admitting: Anesthesiology

## 2022-05-10 ENCOUNTER — Encounter (HOSPITAL_COMMUNITY): Payer: Self-pay | Admitting: Gastroenterology

## 2022-05-10 ENCOUNTER — Ambulatory Visit (HOSPITAL_COMMUNITY)
Admission: RE | Admit: 2022-05-10 | Discharge: 2022-05-10 | Disposition: A | Discharge status: Home / Self Care | Payer: Medicare Other | Attending: Gastroenterology | Admitting: Gastroenterology

## 2022-05-10 ENCOUNTER — Ambulatory Visit (HOSPITAL_COMMUNITY): Payer: Medicare Other | Admitting: Anesthesiology

## 2022-05-10 DIAGNOSIS — I110 Hypertensive heart disease with heart failure: Secondary | ICD-10-CM | POA: Diagnosis not present

## 2022-05-10 DIAGNOSIS — K449 Diaphragmatic hernia without obstruction or gangrene: Secondary | ICD-10-CM | POA: Diagnosis not present

## 2022-05-10 DIAGNOSIS — J449 Chronic obstructive pulmonary disease, unspecified: Secondary | ICD-10-CM | POA: Diagnosis not present

## 2022-05-10 DIAGNOSIS — K2100 Gastro-esophageal reflux disease with esophagitis, without bleeding: Secondary | ICD-10-CM

## 2022-05-10 DIAGNOSIS — R569 Unspecified convulsions: Secondary | ICD-10-CM | POA: Insufficient documentation

## 2022-05-10 DIAGNOSIS — F418 Other specified anxiety disorders: Secondary | ICD-10-CM | POA: Diagnosis not present

## 2022-05-10 DIAGNOSIS — Z79899 Other long term (current) drug therapy: Secondary | ICD-10-CM | POA: Insufficient documentation

## 2022-05-10 DIAGNOSIS — F32A Depression, unspecified: Secondary | ICD-10-CM | POA: Diagnosis not present

## 2022-05-10 DIAGNOSIS — F17210 Nicotine dependence, cigarettes, uncomplicated: Secondary | ICD-10-CM

## 2022-05-10 DIAGNOSIS — K219 Gastro-esophageal reflux disease without esophagitis: Secondary | ICD-10-CM | POA: Insufficient documentation

## 2022-05-10 DIAGNOSIS — R1310 Dysphagia, unspecified: Secondary | ICD-10-CM

## 2022-05-10 DIAGNOSIS — F17290 Nicotine dependence, other tobacco product, uncomplicated: Secondary | ICD-10-CM | POA: Insufficient documentation

## 2022-05-10 DIAGNOSIS — I509 Heart failure, unspecified: Secondary | ICD-10-CM | POA: Insufficient documentation

## 2022-05-10 DIAGNOSIS — I5032 Chronic diastolic (congestive) heart failure: Secondary | ICD-10-CM

## 2022-05-10 HISTORY — PX: ESOPHAGOGASTRODUODENOSCOPY (EGD) WITH PROPOFOL: SHX5813

## 2022-05-10 HISTORY — PX: BIOPSY: SHX5522

## 2022-05-10 SURGERY — ESOPHAGOGASTRODUODENOSCOPY (EGD) WITH PROPOFOL
Anesthesia: General

## 2022-05-10 MED ORDER — PROPOFOL 10 MG/ML IV BOLUS
INTRAVENOUS | Status: DC | PRN
  Administered 2022-05-10: 30 mg via INTRAVENOUS
  Administered 2022-05-10: 150 mg via INTRAVENOUS
  Administered 2022-05-10: 50 mg via INTRAVENOUS

## 2022-05-10 MED ORDER — LACTATED RINGERS IV SOLN
INTRAVENOUS | Status: DC
  Administered 2022-05-10: 1000 mL via INTRAVENOUS

## 2022-05-10 MED ORDER — EPHEDRINE SULFATE-NACL 50-0.9 MG/10ML-% IV SOSY
PREFILLED_SYRINGE | INTRAVENOUS | Status: DC | PRN
  Administered 2022-05-10: 10 mg via INTRAVENOUS

## 2022-05-10 MED ORDER — LIDOCAINE 2% (20 MG/ML) 5 ML SYRINGE
INTRAMUSCULAR | Status: DC | PRN
  Administered 2022-05-10: 50 mg via INTRAVENOUS

## 2022-05-10 NOTE — H&P (Signed)
[name] is an 46 y.o. male.   Chief Complaint: GERD and dysphagia HPI: 46 year old male with past medical history of CHF, COPD, depression, encephalopathy, GERD, hypertension, asthma, coming for evaluation of GERD.    Patient reports that after He started his PPI his reflux has resolved.  He denies any dysphagia or heartburn.  Past Medical History:  Diagnosis Date   Anxiety    Arthritis    knees and fingers   Asthma    CHF (congestive heart failure) (HCC)    COPD (chronic obstructive pulmonary disease) (HCC)    Depression    Encephalopathy    GERD (gastroesophageal reflux disease)    Hypertension    Hyponatremia 06/2016   Pneumonia 2017   Renal disorder    kidney injury    Past Surgical History:  Procedure Laterality Date   COLONOSCOPY     ESOPHAGOGASTRODUODENOSCOPY     EXTERNAL FIXATION LEG Right 12/01/2018   Procedure: EXTERNAL FIXATION LEG;  Surgeon: Juanell Fairly, MD;  Location: [facility] ORS;  Service: Orthopedics;  Laterality: Right;   EXTERNAL FIXATION REMOVAL Right 12/06/2018   Procedure: REMOVAL EXTERNAL FIXATION LEG;  Surgeon: Roby Lofts, MD;  Location: [facility] OR;  Service: Orthopedics;  Laterality: Right;   FINGER SURGERY Left    5th digit-Fracture-"worse now than before"   HARDWARE REMOVAL Right 12/06/2019   Procedure: HARDWARE REMOVAL RIGHT TIBIAL PLATEAU;  Surgeon: Roby Lofts, MD;  Location: [facility] OR;  Service: Orthopedics;  Laterality: Right;   ORIF TIBIA PLATEAU Right 12/06/2018   Procedure: OPEN REDUCTION INTERNAL FIXATION (ORIF) TIBIAL PLATEAU;  Surgeon: Roby Lofts, MD;  Location: [facility] OR;  Service: Orthopedics;  Laterality: Right;   PLEURAL EFFUSION DRAINAGE Right 01/02/2016   Procedure: DRAINAGE OF PLEURAL EFFUSION;  Surgeon: Loreli Slot, MD;  Location: [facility] OR;  Service: Thoracic;  Laterality: Right;   VIDEO ASSISTED THORACOSCOPY (VATS)/DECORTICATION Right 01/02/2016   Procedure: VIDEO ASSISTED THORACOSCOPY (VATS)/DECORTICATION;  Surgeon:  Loreli Slot, MD;  Location: [facility] OR;  Service: Thoracic;  Laterality: Right;   VIDEO BRONCHOSCOPY N/A 01/02/2016   Procedure: VIDEO BRONCHOSCOPY;  Surgeon: Loreli Slot, MD;  Location: [facility] OR;  Service: Thoracic;  Laterality: N/A;    Family History  Problem Relation Age of Onset   CAD Mother 62       Sudden death   Diabetes Mellitus II Father    Prostate cancer Paternal Grandfather    Social History:  reports that he has been smoking pipe and cigarettes. He has a 2.50 pack-year smoking history. He quit smokeless tobacco use about 10 years ago. He reports that he does not drink alcohol and does not use drugs.  Allergies:  Allergies  Allergen Reactions   Codeine Nausea Only   Ibuprofen Nausea Only   Morphine     UNSPECIFIED REACTION - shaking, diaphoresis   Morphine And Related     UNSPECIFIED REACTION - shaking, diaphoresis   Tramadol Nausea Only   Penicillins Nausea And Vomiting    Did it involve swelling of the face/tongue/throat, SOB, or low BP? No Did it involve sudden or severe rash/hives, skin peeling, or any reaction on the inside of your mouth or nose? No Did you need to seek medical attention at a hospital or doctor's office? No When did it last happen?      10+years If all above answers are "NO", may proceed with cephalosporin use.     Medications Prior to Admission  Medication Sig Dispense Refill   albuterol (VENTOLIN  HFA) 108 (90 Base) MCG/ACT inhaler Inhale 1 puff into the lungs 4 (four) times daily as needed for wheezing or shortness of breath.     clonazePAM (KLONOPIN) 1 MG tablet Take 1 mg by mouth 2 (two) times daily.     escitalopram (LEXAPRO) 20 MG tablet Take 20 mg by mouth daily.      fluticasone-salmeterol (ADVAIR) 250-50 MCG/ACT AEPB Inhale 1 puff into the lungs in the morning and at bedtime.     furosemide (LASIX) 20 MG tablet Take 1 tablet (20 mg total) by mouth daily. 90 tablet 3   loperamide (IMODIUM) 2 MG capsule Take 2 mg by mouth as  needed for diarrhea or loose stools.     losartan (COZAAR) 25 MG tablet Take 25 mg by mouth daily.     methocarbamol (ROBAXIN) 750 MG tablet Take 750 mg by mouth 2 (two) times daily.     pantoprazole (PROTONIX) 40 MG tablet TAKE 1 TABLET BY MOUTH TWICE DAILY BEFORE a meal 60 tablet 1   primidone (MYSOLINE) 250 MG tablet Take 250 mg by mouth 3 (three) times daily.     primidone (MYSOLINE) 50 MG tablet Take 50 mg by mouth 3 (three) times daily.     zolpidem (AMBIEN) 5 MG tablet Take 5 mg by mouth at bedtime as needed for sleep.      Results for orders placed or performed during the hospital encounter of 05/09/22 (from the past 48 hour(s))  Basic metabolic panel     Status: Abnormal   Collection Time: 05/09/22 11:45 AM  Result Value Ref Range   Sodium 138 135 - 145 mmol/L   Potassium 3.6 3.5 - 5.1 mmol/L   Chloride 105 98 - 111 mmol/L   CO2 27 22 - 32 mmol/L   Glucose, Bld 56 (L) 70 - 99 mg/dL    Comment: Glucose reference range applies only to samples taken after fasting for at least 8 hours.   BUN 5 (L) 6 - 20 mg/dL   Creatinine, Ser 2.67 0.61 - 1.24 mg/dL   Calcium 8.9 8.9 - 12.4 mg/dL   GFR, Estimated >58 >09 mL/min    Comment: (NOTE) Calculated using the CKD-EPI Creatinine Equation (2021)    Anion gap 6 5 - 15    Comment: Performed at [facility], [address] [place], [place] [address]   No results found.  Review of Systems  Constitutional: Negative.   HENT: Negative.    Eyes: Negative.   Respiratory: Negative.    Cardiovascular: Negative.   Gastrointestinal: Negative.   Endocrine: Negative.   Genitourinary: Negative.   Musculoskeletal: Negative.   Skin: Negative.   Allergic/Immunologic: Negative.   Neurological: Negative.   Hematological: Negative.   Psychiatric/Behavioral: Negative.      Blood pressure 124/83, pulse (!) 55, temperature 98.3 F (36.8 C), temperature source Oral, resp. rate 16, height 5\' 11"  (1.803 m), weight 77.1 kg, SpO2 100 %. Physical Exam   GENERAL: The patient is AO x3, in no acute distress. HEENT: Head is normocephalic and atraumatic. EOMI are intact. Mouth is well hydrated and without lesions. NECK: Supple. No masses LUNGS: Clear to auscultation. No presence of rhonchi/wheezing/rales. Adequate chest expansion HEART: RRR, normal s1 and s2. ABDOMEN: Soft, nontender, no guarding, no peritoneal signs, and nondistended. BS +. No masses. EXTREMITIES: Without any cyanosis, clubbing, rash, lesions or edema. NEUROLOGIC: AOx3, no focal motor deficit. SKIN: no jaundice, no rashes  Assessment/Plan 46 year old male with past medical history of CHF, COPD, depression, encephalopathy,  GERD, hypertension, asthma, coming for evaluation of GERD.  We will proceed with EGD.  Maylon Peppers, MD Gastroenterology and Hepatology [facility]

## 2022-05-10 NOTE — Op Note (Signed)
[facility] Patient Name: [name] Procedure Date: 05/10/2022 2:23 PM MRN: [id number] Date of Birth: [birth year] Attending MD: Maylon Peppers ,  CSN: [id number] Age: 46 Admit Type: Outpatient Procedure:                Upper GI endoscopy Indications:              Suspected gastro-esophageal reflux disease Providers:                Maylon Peppers, Lambert Mody, Randa Spike, Technician Referring MD:              Medicines:                Monitored Anesthesia Care Complications:            No immediate complications. Estimated Blood Loss:     Estimated blood loss: none. Procedure:                Pre-Anesthesia Assessment:                           - Prior to the procedure, a History and Physical                            was performed, and patient medications, allergies                            and sensitivities were reviewed. The patient's                            tolerance of previous anesthesia was reviewed.                           - The risks and benefits of the procedure and the                            sedation options and risks were discussed with the                            patient. All questions were answered and informed                            consent was obtained.                           - ASA Grade Assessment: II - A patient with mild                            systemic disease.                           After obtaining informed consent, the endoscope was                            passed under direct vision. Throughout the  procedure, the patient's blood pressure, pulse, and                            oxygen saturations were monitored continuously. The                            GIF-H190 ([id number]) scope was introduced through the                            mouth, and advanced to the second part of duodenum.                            The upper GI endoscopy was accomplished without                             difficulty. The patient tolerated the procedure                            well. Scope In: 2:41:52 PM Scope Out: 2:46:50 PM Total Procedure Duration: 0 hours 4 minutes 58 seconds  Findings:      LA Grade A (one or more mucosal breaks less than 5 mm, not extending       between tops of 2 mucosal folds) esophagitis with no bleeding was found       40 cm from the incisors. Biopsies were taken with a cold forceps for       histology.      A 2 cm hiatal hernia was found. The proximal extent of the gastric folds       (end of tubular esophagus) was 42 cm from the incisors. The hiatal       narrowing was 40 cm from the incisors. The Z-line was 40 cm from the       incisors.      The gastroesophageal flap valve was visualized endoscopically and       classified as Hill Grade II (fold present, opens with respiration).      The entire examined stomach was normal.      The examined duodenum was normal. Impression:               - LA Grade A reflux esophagitis with no bleeding.                            Biopsied.                           - 2 cm hiatal hernia.                           - Gastroesophageal flap valve classified as Hill                            Grade II (fold present, opens with respiration).                           - Normal stomach.                           -  Normal examined duodenum. Moderate Sedation:      Per Anesthesia Care Recommendation:           - Discharge patient to home (ambulatory).                           - Resume previous diet.                           - Continue present medications.                           - Await pathology results.                           - If recurrent episodes of heartburn or interested                            in not taking medications for acid reflux, can                            discuss possiblity of Transoral Incisionless                            Fundoplication (TIF) - can ask to our office about                             information. Procedure Code(s):        --- Professional ---                           [phone], Esophagogastroduodenoscopy, flexible,                            transoral; with biopsy, single or multiple Diagnosis Code(s):        --- Professional ---                           K21.00, Gastro-esophageal reflux disease with                            esophagitis, without bleeding                           K44.9, Diaphragmatic hernia without obstruction or                            gangrene CPT copyright 2019 American Medical Association. All rights reserved. The codes documented in this report are preliminary and upon coder review may  be revised to meet current compliance requirements. [name], MD [name],  05/10/2022 2:52:40 PM This report has been signed electronically. Number of Addenda: 0

## 2022-05-10 NOTE — Discharge Instructions (Signed)
You are being discharged to home.  Resume your previous diet.  Continue your present medications.  We are waiting for your pathology results.  If recurrent episodes of heartburn or interested in not taking medications for acid reflux, can discuss possiblity of Transoral Incisionless Fundoplication (TIF) - can ask to our office about information.

## 2022-05-10 NOTE — Anesthesia Postprocedure Evaluation (Signed)
Anesthesia Post Note  Patient: Dolores Mcgovern  Procedure(s) Performed: ESOPHAGOGASTRODUODENOSCOPY (EGD) WITH PROPOFOL BIOPSY  Patient location during evaluation: Phase II Anesthesia Type: General Level of consciousness: awake and alert and oriented Pain management: pain level controlled Vital Signs Assessment: post-procedure vital signs reviewed and stable Respiratory status: spontaneous breathing, nonlabored ventilation and respiratory function stable Cardiovascular status: blood pressure returned to baseline and stable Postop Assessment: no apparent nausea or vomiting Anesthetic complications: no   No notable events documented.   Last Vitals:  Vitals:   05/10/22 1324 05/10/22 1452  BP: 124/83 (!) 143/68  Pulse: (!) 55 (!) 51  Resp: 16 16  Temp: 36.8 C 37.2 C  SpO2: 100% 98%    Last Pain:  Vitals:   05/10/22 1452  TempSrc: Oral  PainSc: 0-No pain                 Stephenie Navejas C Cheyenne Schumm

## 2022-05-10 NOTE — Transfer of Care (Signed)
Immediate Anesthesia Transfer of Care Note  Patient: [name]  Procedure(s) Performed: ESOPHAGOGASTRODUODENOSCOPY (EGD) WITH PROPOFOL BIOPSY  Patient Location: Short Stay  Anesthesia Type:MAC  Level of Consciousness: awake, alert , oriented and patient cooperative  Airway & Oxygen Therapy: Patient Spontanous Breathing  Post-op Assessment: Report given to RN, Post -op Vital signs reviewed and stable and Patient moving all extremities  Post vital signs: Reviewed and stable  Last Vitals:  Vitals Value Taken Time  BP    Temp    Pulse    Resp    SpO2      Last Pain:  Vitals:   05/10/22 1436  TempSrc:   PainSc: 8       Patients Stated Pain Goal: 9 (81/18/86 7737)  Complications: No notable events documented.
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
## 2022-05-12 ENCOUNTER — Ambulatory Visit (INDEPENDENT_AMBULATORY_CARE_PROVIDER_SITE_OTHER): Payer: Medicare Other | Admitting: Orthopaedic Surgery

## 2022-05-12 ENCOUNTER — Encounter: Payer: Self-pay | Admitting: Orthopaedic Surgery

## 2022-05-12 DIAGNOSIS — M1731 Unilateral post-traumatic osteoarthritis, right knee: Secondary | ICD-10-CM

## 2022-05-12 DIAGNOSIS — M25361 Other instability, right knee: Secondary | ICD-10-CM | POA: Diagnosis not present

## 2022-05-12 LAB — SURGICAL PATHOLOGY
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
## 2022-05-16 ENCOUNTER — Encounter (HOSPITAL_COMMUNITY): Payer: Self-pay | Admitting: Emergency Medicine

## 2022-05-16 ENCOUNTER — Other Ambulatory Visit: Payer: Self-pay

## 2022-05-16 ENCOUNTER — Emergency Department (HOSPITAL_COMMUNITY)
Admission: EM | Admit: 2022-05-16 | Discharge: 2022-05-16 | Disposition: A | Discharge status: Home / Self Care | Payer: Medicare Other | Attending: Emergency Medicine | Admitting: Emergency Medicine

## 2022-05-16 DIAGNOSIS — Z7951 Long term (current) use of inhaled steroids: Secondary | ICD-10-CM | POA: Insufficient documentation

## 2022-05-16 DIAGNOSIS — J449 Chronic obstructive pulmonary disease, unspecified: Secondary | ICD-10-CM | POA: Diagnosis not present

## 2022-05-16 DIAGNOSIS — I509 Heart failure, unspecified: Secondary | ICD-10-CM | POA: Insufficient documentation

## 2022-05-16 DIAGNOSIS — Z79899 Other long term (current) drug therapy: Secondary | ICD-10-CM | POA: Insufficient documentation

## 2022-05-16 DIAGNOSIS — I110 Hypertensive heart disease with heart failure: Secondary | ICD-10-CM | POA: Diagnosis not present

## 2022-05-16 DIAGNOSIS — Z760 Encounter for issue of repeat prescription: Secondary | ICD-10-CM | POA: Insufficient documentation

## 2022-05-16 MED ORDER — CLONAZEPAM 0.5 MG PO TABS
1.0000 mg | ORAL_TABLET | Freq: Once | ORAL | Status: AC
  Administered 2022-05-16: 1 mg via ORAL
  Filled 2022-05-16: qty 2

## 2022-05-16 MED ORDER — CLONAZEPAM 1 MG PO TABS: 1.0000 mg | ORAL_TABLET | Freq: Two times a day (BID) | ORAL | 0 refills | Status: DC

## 2022-05-16 NOTE — ED Provider Notes (Signed)
[facility] EMERGENCY DEPARTMENT Provider Note   CSN: [id number] Arrival date & time: 05/16/22  1407     History  Chief Complaint  Patient presents with   Medication Refill    Bernice Mullin is a 46 y.o. male with history of depression, anxiety, hypertension, CHF, COPD who presents emergency department with group home caregiver with request for medication refill.  Is currently on 1 mg Klonopin twice a day.  Patient had an appointment at [facility] today and they could not extend his prescription.  Group home caregiver has documentation the patient does not have an appointment with the prescriber of the medication until 7/24, and the [facility] facility recommended that he be seen in the ER for medication refill.   Medication Refill      Home Medications Prior to Admission medications   Medication Sig Start Date End Date Taking? Authorizing Provider  clonazePAM (KLONOPIN) 1 MG tablet Take 1 tablet (1 mg total) by mouth 2 (two) times daily. 05/16/22 06/15/22 Yes Temitope Flammer T, PA-C  albuterol (VENTOLIN HFA) 108 (90 Base) MCG/ACT inhaler Inhale 1 puff into the lungs 4 (four) times daily as needed for wheezing or shortness of breath.    [provider]  escitalopram (LEXAPRO) 20 MG tablet Take 20 mg by mouth daily.     [provider]  fluticasone-salmeterol (ADVAIR) 250-50 MCG/ACT AEPB Inhale 1 puff into the lungs in the morning and at bedtime.    [provider]  furosemide (LASIX) 20 MG tablet Take 1 tablet (20 mg total) by mouth daily. 09/15/21   Rollene Rotunda, MD  loperamide (IMODIUM) 2 MG capsule Take 2 mg by mouth as needed for diarrhea or loose stools. 04/26/22   [provider]  losartan (COZAAR) 25 MG tablet Take 25 mg by mouth daily.    [provider]  methocarbamol (ROBAXIN) 750 MG tablet Take 750 mg by mouth 2 (two) times daily.    [provider]  pantoprazole (PROTONIX) 40 MG tablet TAKE 1 TABLET BY MOUTH TWICE DAILY  BEFORE a meal 04/21/22   Carlan, Chelsea L, NP  primidone (MYSOLINE) 250 MG tablet Take 250 mg by mouth 3 (three) times daily.    [provider]  primidone (MYSOLINE) 50 MG tablet Take 50 mg by mouth 3 (three) times daily.    [provider]  zolpidem (AMBIEN) 5 MG tablet Take 5 mg by mouth at bedtime as needed for sleep.    [provider]      Allergies    Codeine, Ibuprofen, Morphine, Morphine and related, Tramadol, and Penicillins    Review of Systems   Review of Systems  All other systems reviewed and are negative.   Physical Exam Updated Vital Signs BP 125/81 (BP Location: Right Arm)   Pulse (!) 56   Temp 98.1 F (36.7 C) (Oral)   Resp 18   Ht 5\' 11"  (1.803 m)   Wt 77.1 kg   SpO2 95%   BMI 23.71 kg/m  Physical Exam Vitals and nursing note reviewed.  Constitutional:      Appearance: Normal appearance.  HENT:     Head: Normocephalic and atraumatic.  Eyes:     Conjunctiva/sclera: Conjunctivae normal.  Pulmonary:     Effort: Pulmonary effort is normal. No respiratory distress.  Skin:    General: Skin is warm and dry.  Neurological:     Mental Status: He is alert.  Psychiatric:        Mood and Affect: Mood normal.  Behavior: Behavior normal.     ED Results / Procedures / Treatments   Labs (all labs ordered are listed, but only abnormal results are displayed) Labs Reviewed - No data to display  EKG None  Radiology No results found.  Procedures Procedures    Medications Ordered in ED Medications  clonazePAM (KLONOPIN) tablet 1 mg (has no administration in time range)    ED Course/ Medical Decision Making/ A&P                           Medical Decision Making Risk Prescription drug management.   Patient is 46 year old male with history of depression, anxiety, hypertension, CHF, COPD who presents emergency department with group home caregiver with request for medication refill.  Is currently on 1 mg Klonopin twice  a day.  Patient appears clinically well with normal vital signs. Is requesting one tablet of home medication here before discharge. Will refill prescription and encourage PCP follow up.   Final Clinical Impression(s) / ED Diagnoses Final diagnoses:  Medication refill    Rx / DC Orders ED Discharge Orders          Ordered    clonazePAM (KLONOPIN) 1 MG tablet  2 times daily        05/16/22 1607           Portions of this report may have been transcribed using voice recognition software. Every effort was made to ensure accuracy; however, inadvertent computerized transcription errors may be present.    Jeanella Flattery 05/16/22 1611    Eber Hong, MD 05/18/22 1137

## 2022-05-17 ENCOUNTER — Encounter (HOSPITAL_COMMUNITY): Payer: Self-pay | Admitting: Gastroenterology

## 2022-05-17 ENCOUNTER — Encounter (INDEPENDENT_AMBULATORY_CARE_PROVIDER_SITE_OTHER): Payer: Self-pay | Admitting: *Deleted

## 2022-06-07 ENCOUNTER — Encounter (INDEPENDENT_AMBULATORY_CARE_PROVIDER_SITE_OTHER): Payer: Self-pay | Admitting: Gastroenterology

## 2022-06-21 ENCOUNTER — Other Ambulatory Visit (INDEPENDENT_AMBULATORY_CARE_PROVIDER_SITE_OTHER): Payer: Self-pay | Admitting: Gastroenterology

## 2022-07-28 ENCOUNTER — Ambulatory Visit (INDEPENDENT_AMBULATORY_CARE_PROVIDER_SITE_OTHER): Payer: Medicaid Other | Admitting: Gastroenterology

## 2022-08-29 ENCOUNTER — Ambulatory Visit (INDEPENDENT_AMBULATORY_CARE_PROVIDER_SITE_OTHER): Payer: Medicare Other | Admitting: Gastroenterology

## 2022-09-03 IMAGING — CT CT PELVIS W/ CM
2 of 3 series · 16 of 46 positions shown, 18 images · IV contrast (APPLIED)
Comparison: Plain films earlier today

CLINICAL DATA: Pelvic fractures

EXAM:
CT PELVIS WITH CONTRAST
TECHNIQUE: Multidetector CT imaging of the pelvis was performed using the
standard protocol following the bolus administration of intravenous
contrast.
CONTRAST:  100mL OMNIPAQUE IOHEXOL 300 MG/ML  SOLN

[Series 4: routine abd/pel with · axial · 0.75mm/px · z∈[-1222,-962]mm · 13 of 60 slices shown, 15 images]
[im 4/60  soft-tissue]
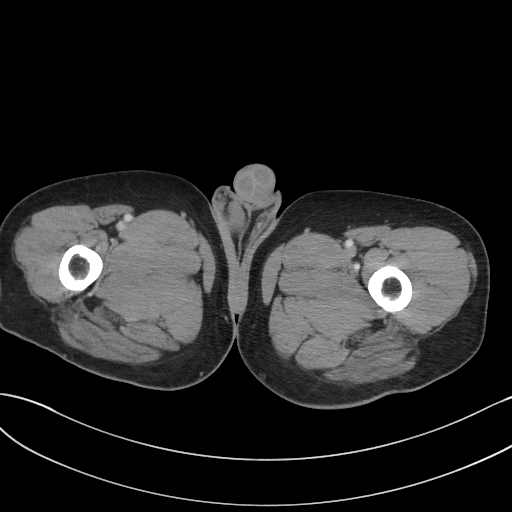
[im 4/60  bone]
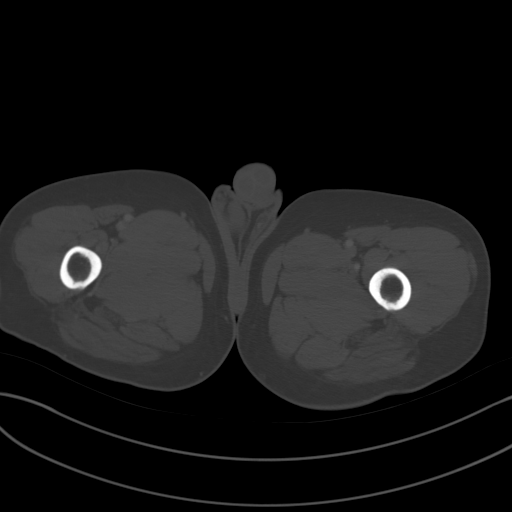
[im 8/60  soft-tissue]
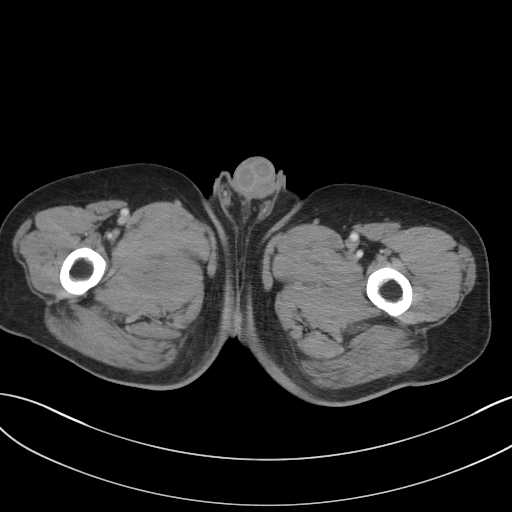
[im 12/60  soft-tissue]
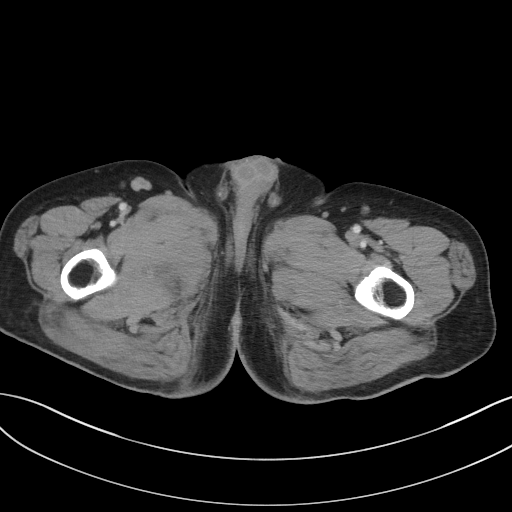
[im 18/60  soft-tissue]
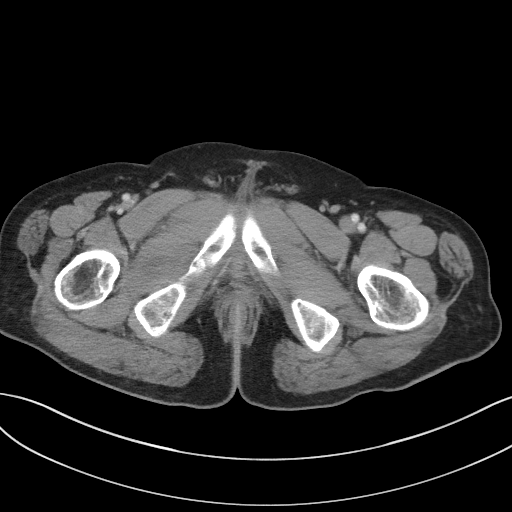
[im 21/60  soft-tissue]
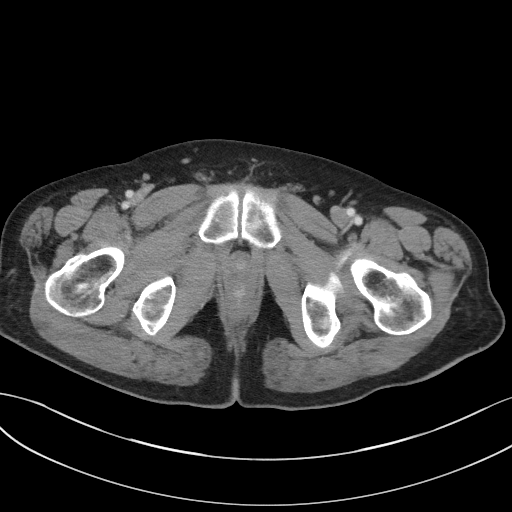
[im 25/60  soft-tissue]
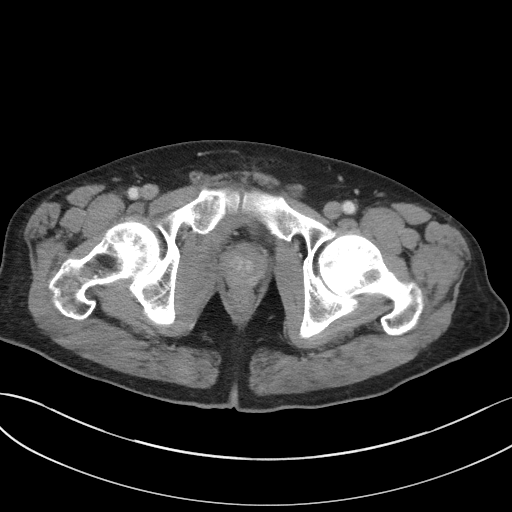
[im 31/60  soft-tissue]
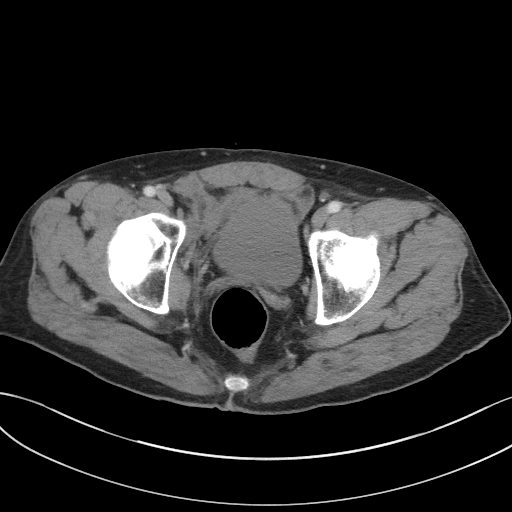
[im 35/60  soft-tissue]
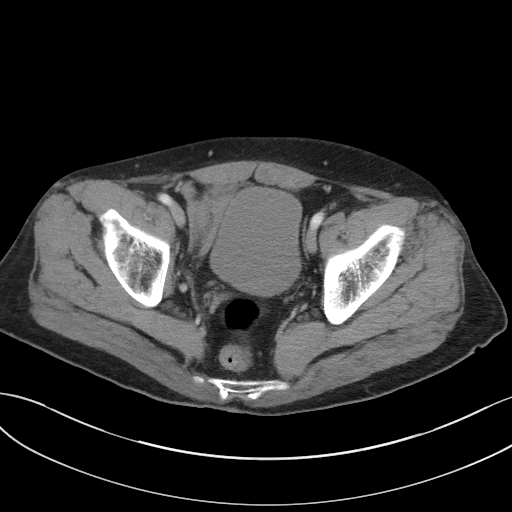
[im 39/60  soft-tissue]
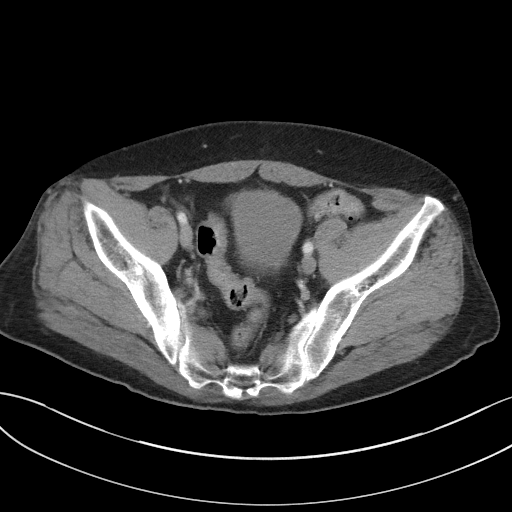
[im 39/60  bone]
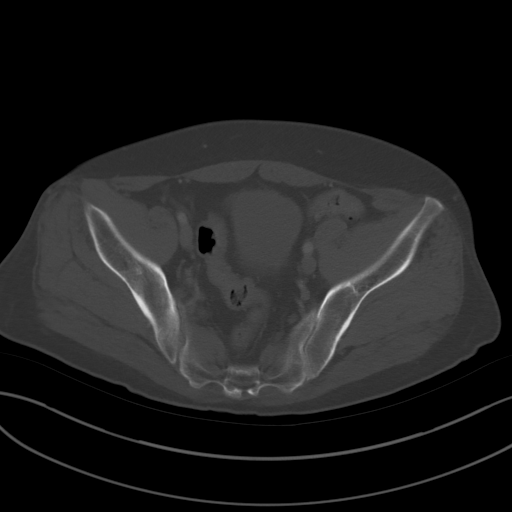
[im 42/60  soft-tissue]
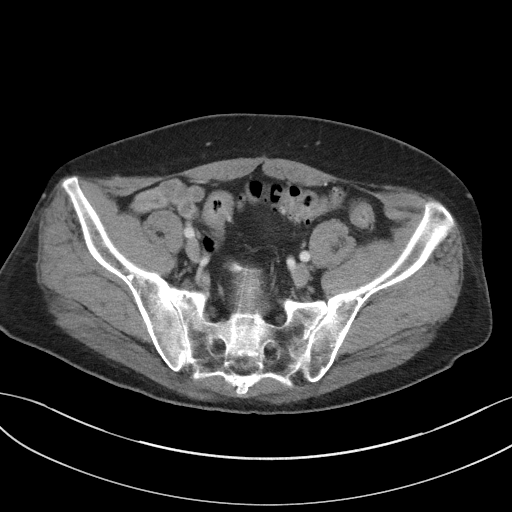
[im 48/60  soft-tissue]
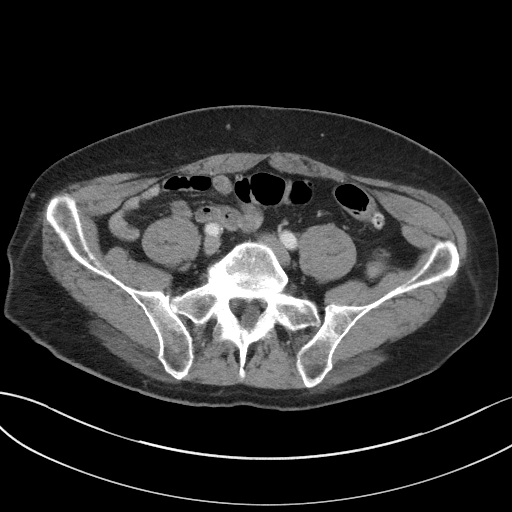
[im 52/60  soft-tissue]
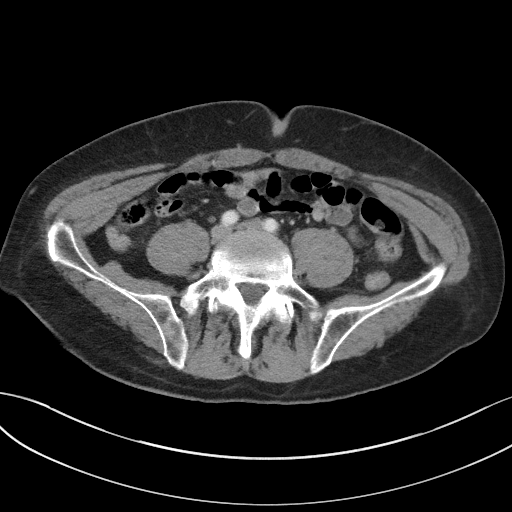
[im 56/60  soft-tissue]
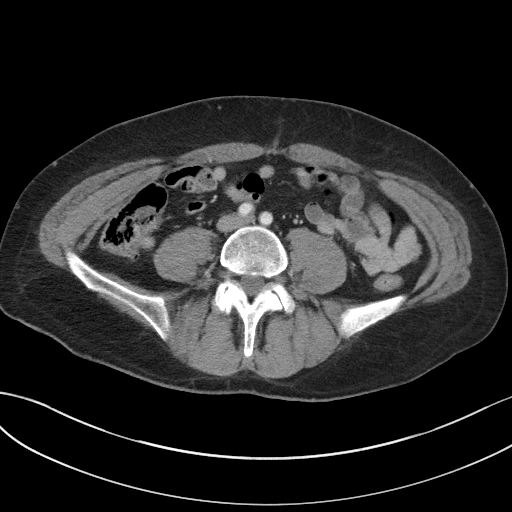

[Series 6: coronal st · coronal · 0.60mm/px · 3 of 71 slices shown]
[im 24/71  soft-tissue]
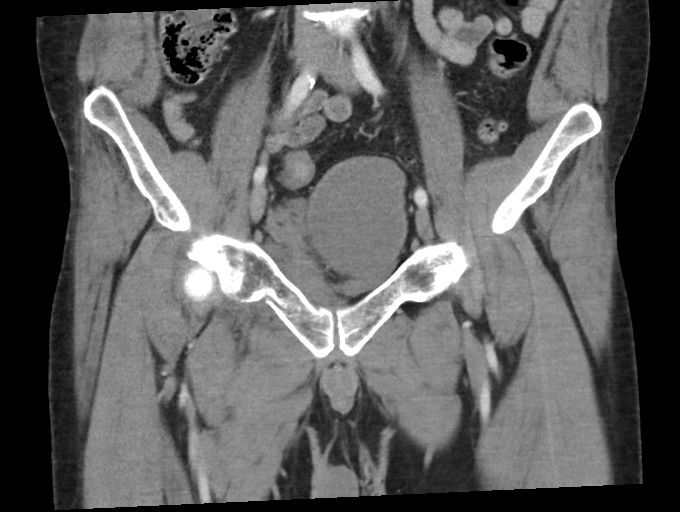
[im 32/71  soft-tissue]
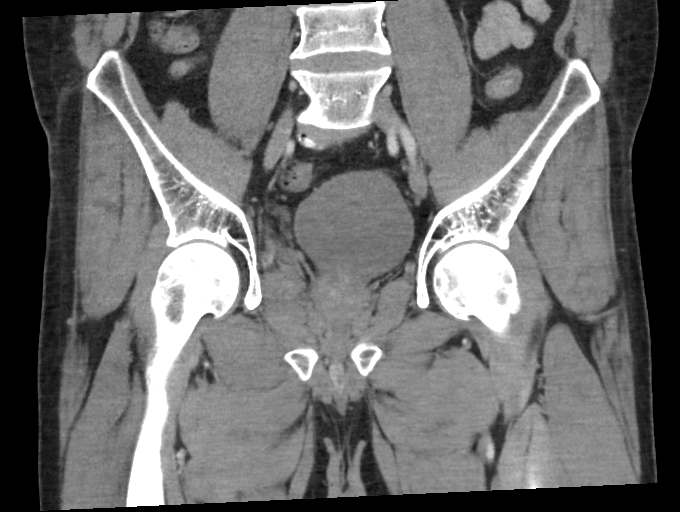
[im 39/71  soft-tissue]
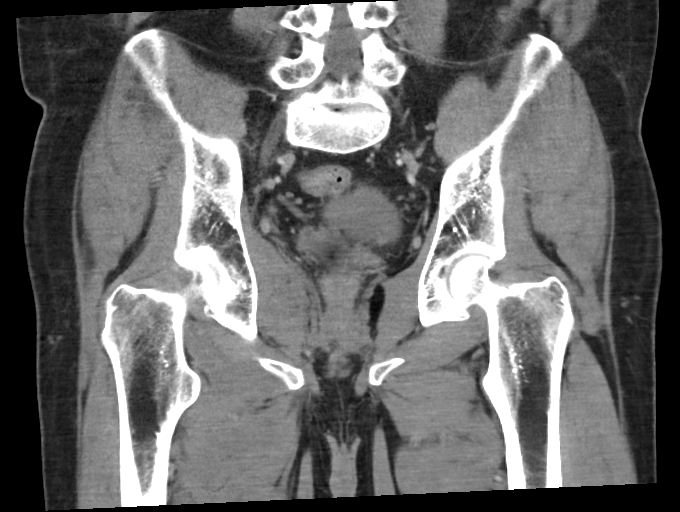

[16 of 46 positions shown; findings below may reference images not displayed]

FINDINGS: Urinary Tract:  No abnormality visualized.

Bowel:  Unremarkable visualized pelvic bowel loops.

Vascular/Lymphatic: Aorta and iliac vessels normal caliber. No
adenopathy.

Reproductive:  No visible focal abnormality.

Other:  No free fluid or free air.

Musculoskeletal: Fractures seen through the right superior and
inferior pubic rami as seen by plain film. No proximal femoral
abnormality. There is hematoma seen within the right extraperitoneal
soft tissues adjacent to the superior pubic ramus fracture. Mild
enlargement of the adjacent adductor muscles and piriformis muscle,
likely mild intramuscular hematoma. No active extravasation of
contrast seen.
IMPRESSION: Fractures through the right superior and inferior pubic ramus with
adjacent extraperitoneal hematoma and probable piriformis/adductor
muscle hematoma. No active extravasation of contrast.

## 2022-09-03 IMAGING — CR DG PELVIS 1-2V
1 series · 1 of 1 positions shown · non-contrast
Comparison: Prior study from 06/01/2007.

CLINICAL DATA: Initial evaluation for acute trauma, fall.

EXAM:
PELVIS - 1-2 VIEW

[dg pelvis 1-2 views]
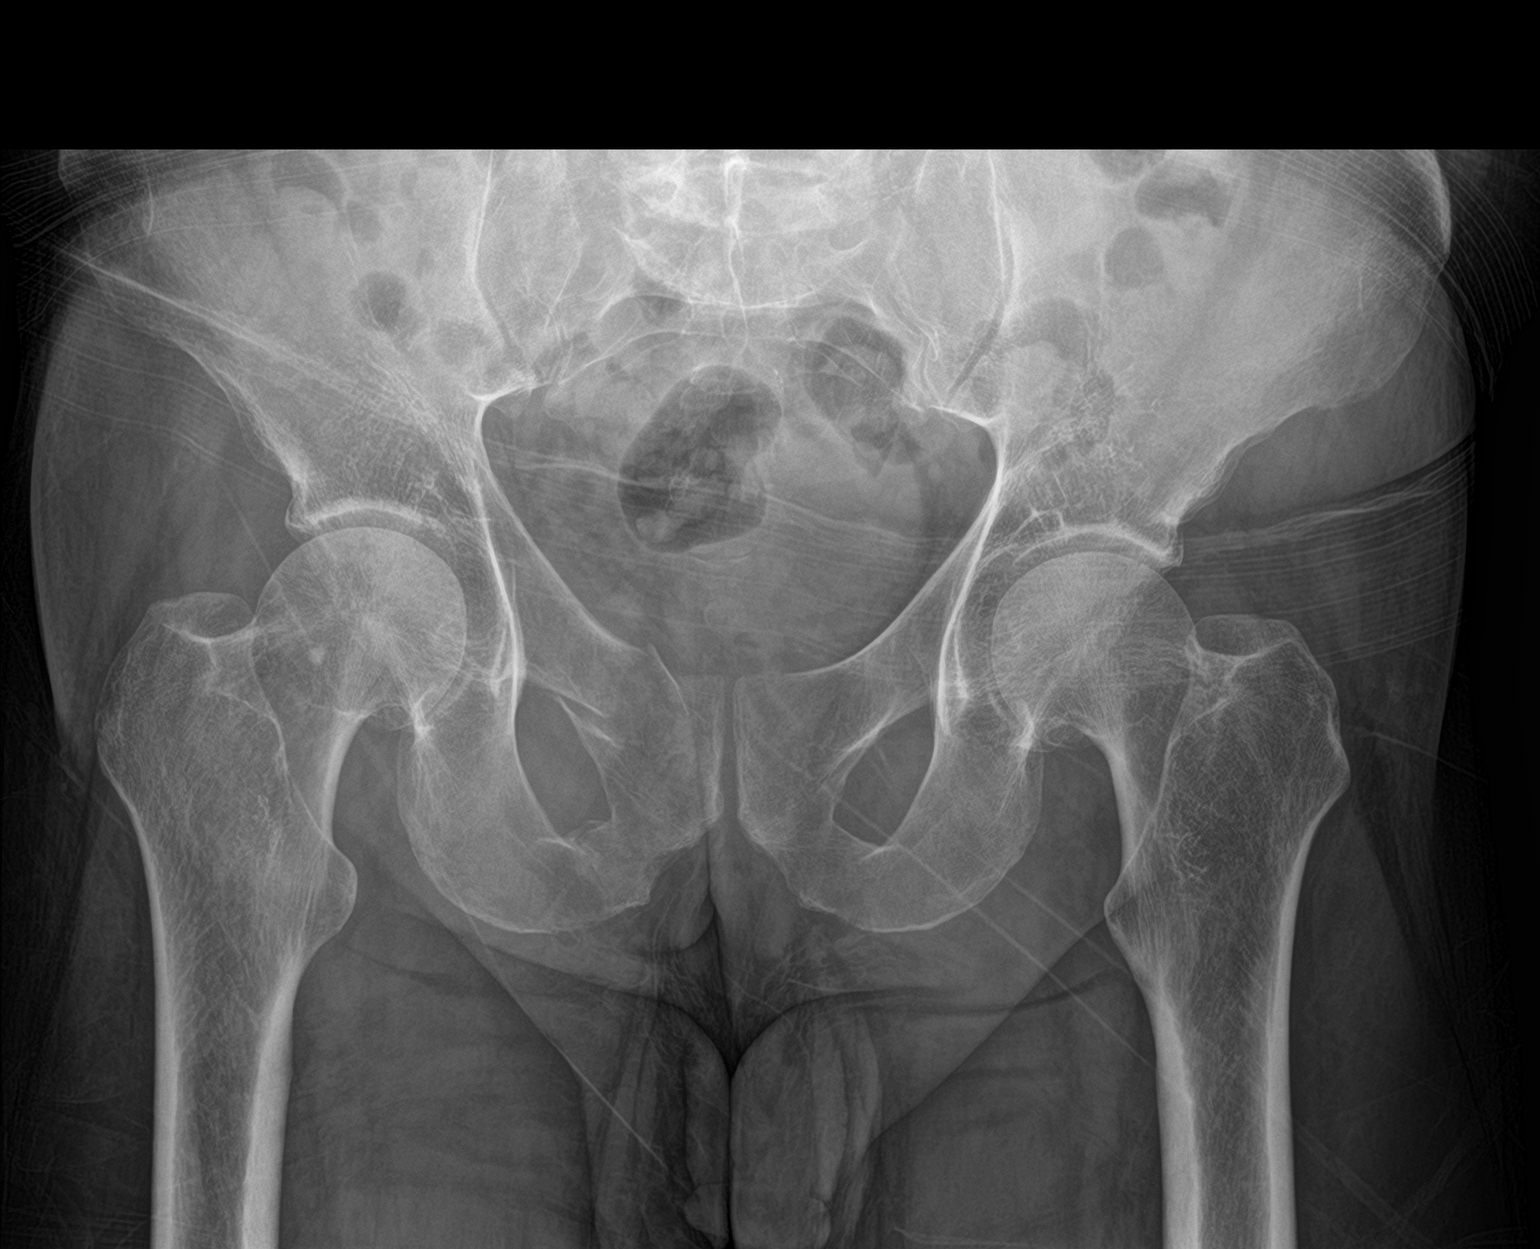

[1 of 1 positions shown; findings below may reference images not displayed]

FINDINGS: Cortical irregularity seen extending through the right superior and
inferior pubic rami, consistent with acute minimally displaced
fractures. No associated pubic diastasis. SI joints remain
approximated. Remainder of the visualized bony pelvis intact. No
other acute osseous abnormality about the hips.

Underlying osteopenia.  No visible soft tissue injury.
IMPRESSION: Acute mildly displaced fractures extending through the right
superior and inferior pubic rami.

## 2022-09-03 IMAGING — CR DG FEMUR 2+V*R*
1 series · 4 of 4 positions shown · non-contrast
Comparison: None.

CLINICAL DATA: Initial evaluation for acute trauma, fall.

EXAM:
RIGHT FEMUR 2 VIEWS

[Series 1: dg hip unilat w or w/o pelvis 2-3 views  · non-contrast · 0.14mm/px · 4 of 4 slices shown]
[im 1/4]
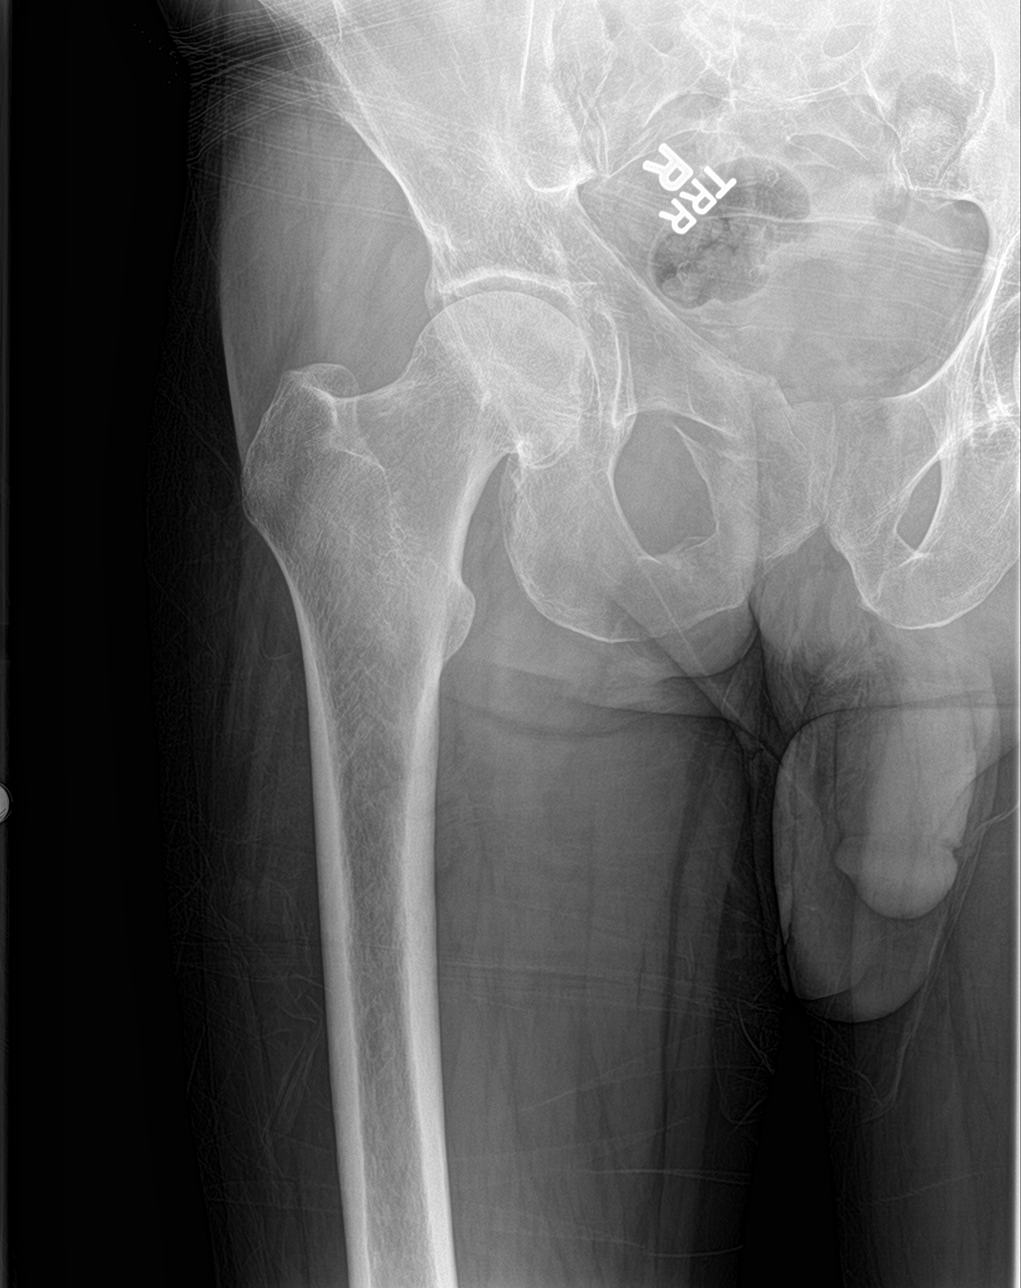
[im 2/4]
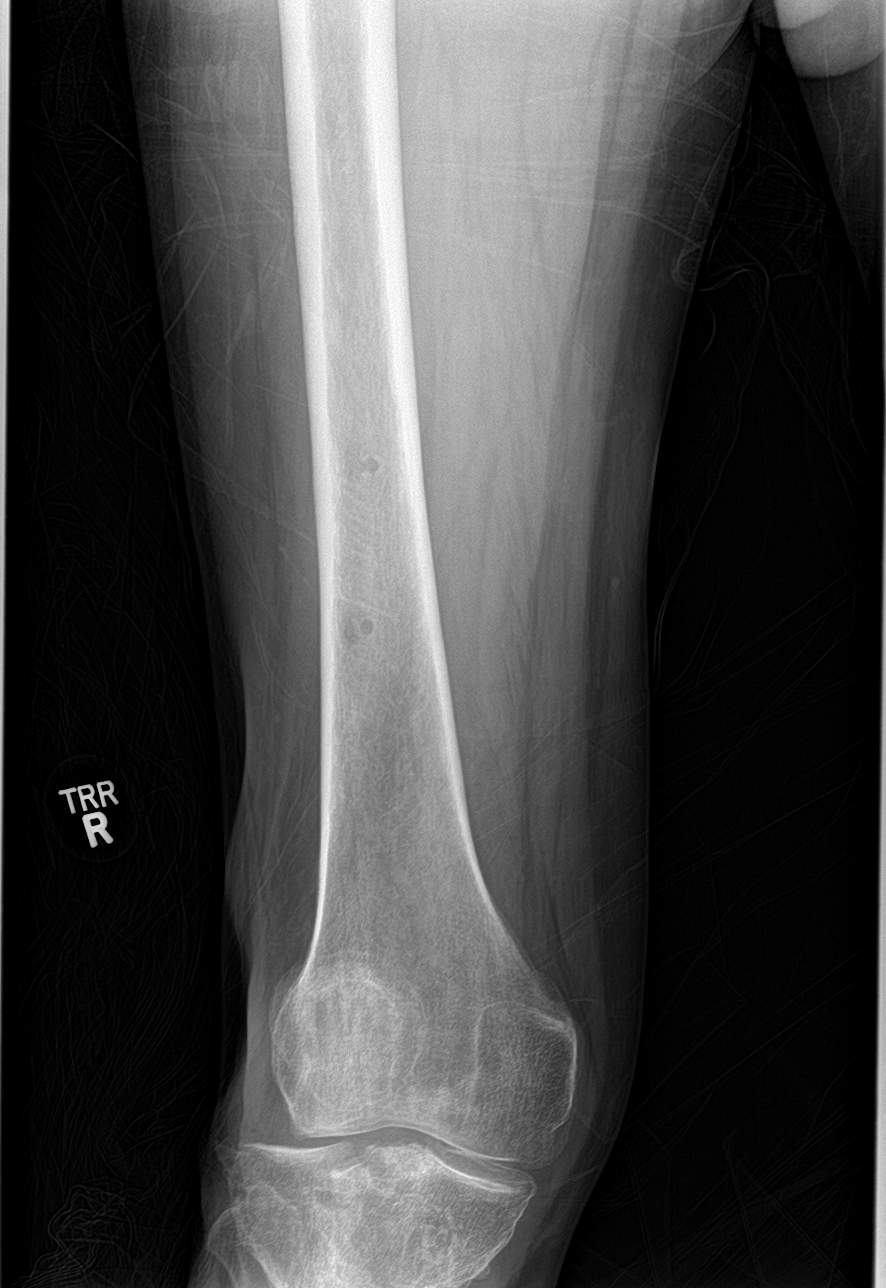
[im 3/4]
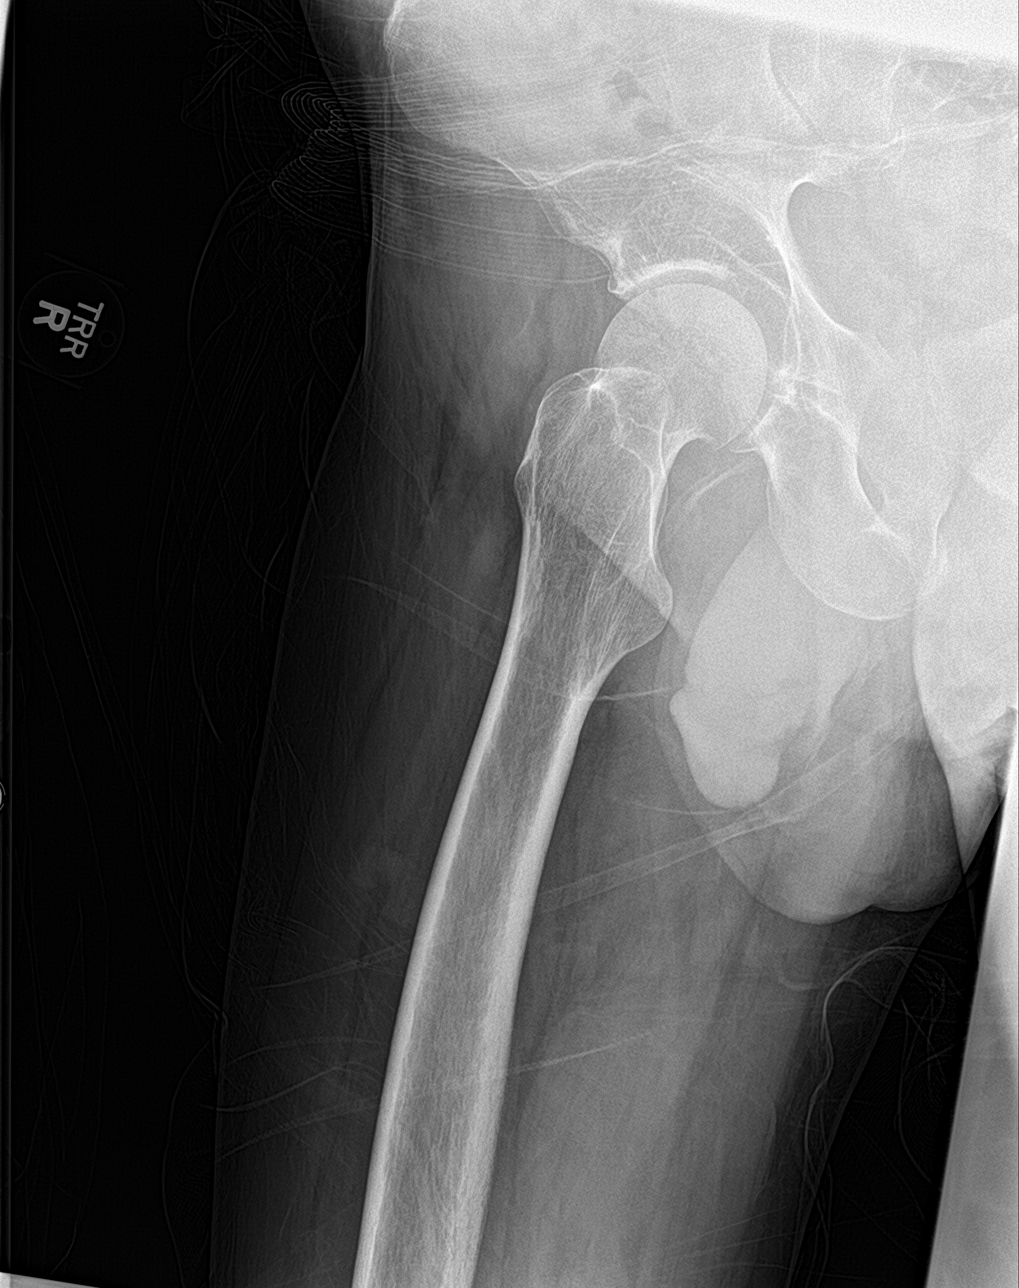
[im 4/4]
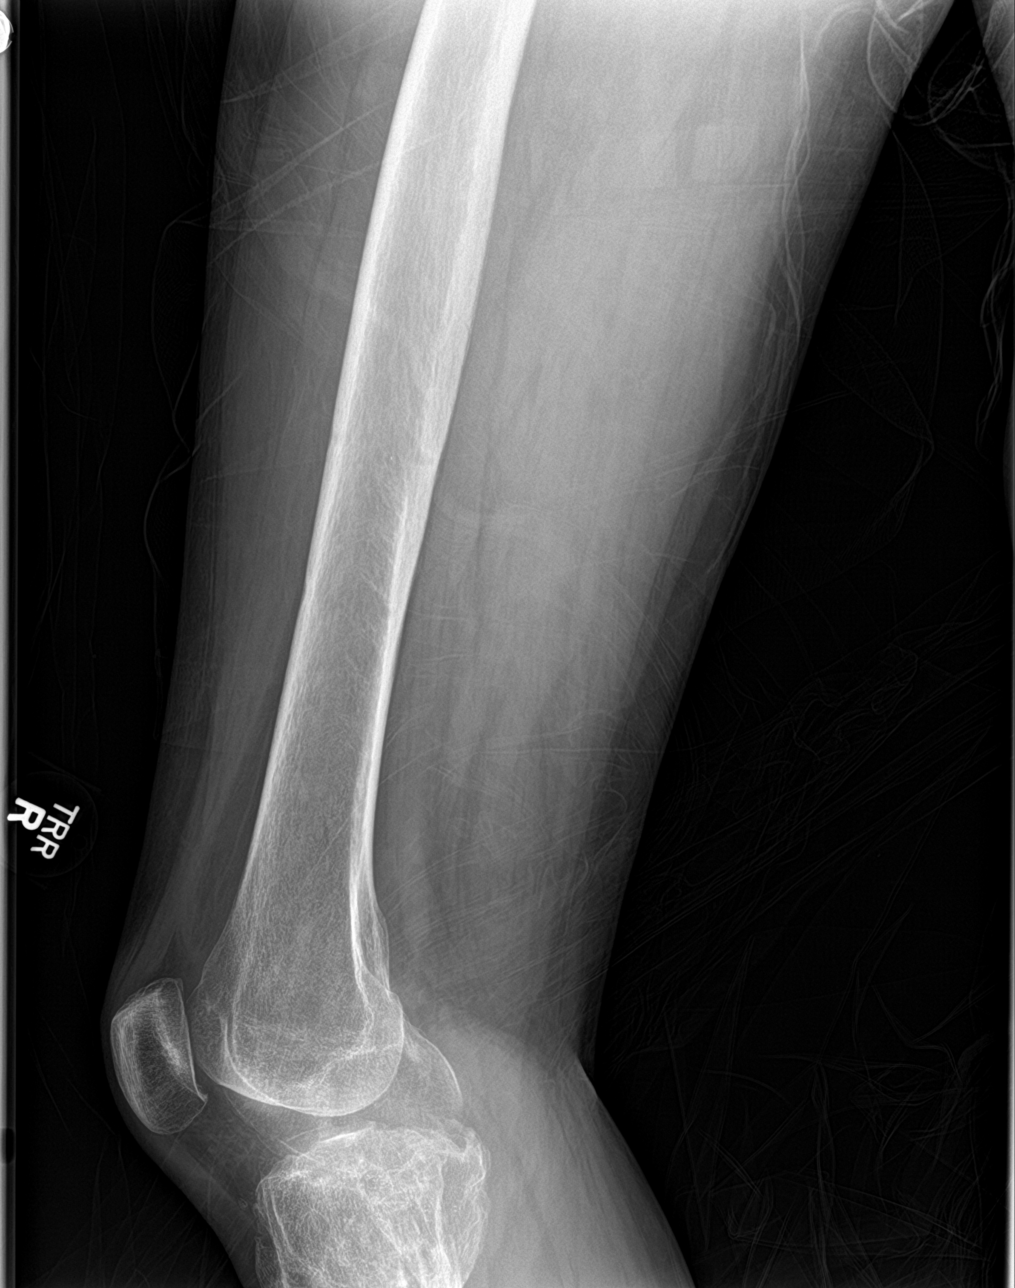

[4 of 4 positions shown; findings below may reference images not displayed]

FINDINGS: Acute mildly displaced fractures seen extending through the right
superior and inferior pubic rami/pubis symphysis. Remainder the
visualized bony pelvis intact. No other acute fracture dislocation
about the right femur. Advanced for age degenerative changes noted
at the visualized right knee. Sequelae of prior ORIF with hardware
removal noted at the distal femoral shaft. Advanced osteopenia for
age. No soft tissue abnormality.
IMPRESSION: 1. Acute mildly displaced fractures extending through the right
superior and inferior pubic rami/pubis symphysis.
2. No other acute fracture or dislocation about the right femur.

## 2022-09-27 ENCOUNTER — Other Ambulatory Visit (HOSPITAL_COMMUNITY)
Admission: RE | Admit: 2022-09-27 | Discharge: 2022-09-27 | Disposition: A | Discharge status: Home / Self Care | Payer: Medicare Other | Source: Ambulatory Visit | Attending: Internal Medicine | Admitting: Internal Medicine

## 2022-09-27 DIAGNOSIS — N539 Unspecified male sexual dysfunction: Secondary | ICD-10-CM | POA: Diagnosis present

## 2022-09-28 LAB — TESTOSTERONE: Testosterone: 786 ng/dL (ref 264–916)

## 2022-10-11 ENCOUNTER — Other Ambulatory Visit (HOSPITAL_COMMUNITY)
Admission: RE | Admit: 2022-10-11 | Discharge: 2022-10-11 | Disposition: A | Discharge status: Home / Self Care | Payer: Medicare Other | Source: Ambulatory Visit | Attending: Internal Medicine | Admitting: Internal Medicine

## 2022-10-11 ENCOUNTER — Ambulatory Visit (HOSPITAL_COMMUNITY)
Admission: RE | Admit: 2022-10-11 | Discharge: 2022-10-11 | Disposition: A | Discharge status: Home / Self Care | Payer: Medicare Other | Source: Ambulatory Visit | Attending: Gerontology | Admitting: Gerontology

## 2022-10-11 ENCOUNTER — Other Ambulatory Visit (HOSPITAL_COMMUNITY): Payer: Self-pay | Admitting: Gerontology

## 2022-10-11 DIAGNOSIS — R079 Chest pain, unspecified: Secondary | ICD-10-CM | POA: Insufficient documentation

## 2022-10-11 LAB — CBC WITH DIFFERENTIAL/PLATELET
Abs Immature Granulocytes: 0.02 10*3/uL (ref 0.00–0.07)
Basophils Absolute: 0.1 10*3/uL (ref 0.0–0.1)
Basophils Relative: 1 %
Eosinophils Absolute: 0.2 10*3/uL (ref 0.0–0.5)
Eosinophils Relative: 3 %
HCT: 42.2 % (ref 39.0–52.0)
Hemoglobin: 14.6 g/dL (ref 13.0–17.0)
Immature Granulocytes: 0 %
Lymphocytes Relative: 24 %
Lymphs Abs: 1.2 10*3/uL (ref 0.7–4.0)
MCH: 32.4 pg (ref 26.0–34.0)
MCHC: 34.6 g/dL (ref 30.0–36.0)
MCV: 93.8 fL (ref 80.0–100.0)
Monocytes Absolute: 0.4 10*3/uL (ref 0.1–1.0)
Monocytes Relative: 9 %
Neutro Abs: 3.1 10*3/uL (ref 1.7–7.7)
Neutrophils Relative %: 63 %
Platelets: 216 10*3/uL (ref 150–400)
RBC: 4.5 MIL/uL (ref 4.22–5.81)
RDW: 13.4 % (ref 11.5–15.5)
WBC: 5 10*3/uL (ref 4.0–10.5)
nRBC: 0 % (ref 0.0–0.2)

## 2022-10-31 ENCOUNTER — Encounter (INDEPENDENT_AMBULATORY_CARE_PROVIDER_SITE_OTHER): Payer: Self-pay | Admitting: Gastroenterology

## 2022-10-31 ENCOUNTER — Ambulatory Visit (INDEPENDENT_AMBULATORY_CARE_PROVIDER_SITE_OTHER): Payer: Medicare Other | Admitting: Gastroenterology

## 2022-10-31 VITALS — BP 123/77 | HR 54 | Temp 98.4°F | Ht 71.0 in | Wt 141.2 lb

## 2022-10-31 DIAGNOSIS — K219 Gastro-esophageal reflux disease without esophagitis: Secondary | ICD-10-CM | POA: Diagnosis not present

## 2022-10-31 DIAGNOSIS — R197 Diarrhea, unspecified: Secondary | ICD-10-CM | POA: Diagnosis not present

## 2022-10-31 NOTE — Progress Notes (Addendum)
Referring Provider: Carrolyn Meiers* Primary Care Physician:  Carrolyn Meiers, MD Primary GI Physician: Jenetta Downer   Chief Complaint  Patient presents with   Follow-up    Patient here today for a follow up on Gerd. He is taking pantoprazole 40 mg bid and says this is working well for him.   HPI:   [name] is a 46 y.o. male with past medical history of  anxiety, arthritis, asthma, CHF, COPD, depression, GERD, HTN.    Patient presenting today for follow up of GERD/dysphagia.  Last seen may 2023, at that time he reported GERD for 20 years, had been on nexium 40mg  once daily for many years, tried omeprazole and prilosec in the past without good result. States he has heartburn and acid regurgitation, even sometimes with a glass of water he may have heartburn. Symptoms started flaring up around 3 weeks ago. Also endorses dysphagia fairly often, this occurs with both solids and pills for the past month, sometimes happens with liquids. He sometimes has to cough the substance back up. This is occurring maybe 2-3x/week.   Recommended scheduling EGD, start protonix 40mg  BID, reflux and chewing precautions and pt to let me know if he wished to proceed with screening colonoscopy   Present:  Patient reports that GERD Is well under control. He can eat anything he wants. No more dysphagia since EGD. Feels very good since EGD and doing well on protonix 40mg  BID.   Notes he is having some diarrhea, intermittent for the past year or so, usually every other day will have 2 episodes of diarrhea when it occurs. Denies any abdominal pain. Denies rectal bleeding or melena. PCP gave him some imodium to use which he has good results from. Has normal, solid stools during times he is not having diarrhea. He is weighing 141 lbs today, was 170 lbs in June. States that appetite is good. Sometimes he skips lunch but not often. Diarrhea does not seem to correlate with any specific foods  Cbc in  November WNL, BMP in June with normal electrolytes  Last Colonoscopy: >10 years ago, diverticulosis   Last Endoscopy:04/2022 [place] Grade A reflux esophagitis with no bleeding. Biopsied.Biopsy with changes of reflux  - 2 cm hiatal hernia. - Gastroesophageal flap valve classified as Hill Grade II (fold present, opens with respiration).  - Normal stomach. - Normal examined duodenum.  Recommendations:    Past Medical History:  Diagnosis Date   Anxiety    Arthritis    knees and fingers   Asthma    CHF (congestive heart failure) (HCC)    COPD (chronic obstructive pulmonary disease) (HCC)    Depression    Encephalopathy    GERD (gastroesophageal reflux disease)    Hypertension    Hyponatremia 06/2016   Pneumonia 2017   Renal disorder    kidney injury    Past Surgical History:  Procedure Laterality Date   BIOPSY  05/10/2022   Procedure: BIOPSY;  Surgeon: Harvel Quale, MD;  [facility];  Service: Gastroenterology;;   COLONOSCOPY     ESOPHAGOGASTRODUODENOSCOPY     ESOPHAGOGASTRODUODENOSCOPY (EGD) WITH PROPOFOL N/A 05/10/2022   Procedure: ESOPHAGOGASTRODUODENOSCOPY (EGD) WITH PROPOFOL;  Surgeon: Harvel Quale, MD;  [facility];  Service: Gastroenterology;  Laterality: N/A;  205   EXTERNAL FIXATION LEG Right 12/01/2018   Procedure: EXTERNAL FIXATION LEG;  Surgeon: Thornton Park, MD;  Location: [facility];  Service: Orthopedics;  Laterality: Right;   EXTERNAL FIXATION REMOVAL Right 12/06/2018  Procedure: REMOVAL EXTERNAL FIXATION LEG;  Surgeon: Shona Needles, MD;  Location: [place];  Service: Orthopedics;  Laterality: Right;   FINGER SURGERY Left    5th digit-Fracture-"worse now than before"   HARDWARE REMOVAL Right 12/06/2019   Procedure: HARDWARE REMOVAL RIGHT TIBIAL PLATEAU;  Surgeon: Shona Needles, MD;  Location: [place];  Service: Orthopedics;  Laterality: Right;   ORIF TIBIA PLATEAU Right 12/06/2018   Procedure: OPEN REDUCTION  INTERNAL FIXATION (ORIF) TIBIAL PLATEAU;  Surgeon: Shona Needles, MD;  Location: [place];  Service: Orthopedics;  Laterality: Right;   PLEURAL EFFUSION DRAINAGE Right 01/02/2016   Procedure: DRAINAGE OF PLEURAL EFFUSION;  Surgeon: Melrose Nakayama, MD;  Location: [place];  Service: Thoracic;  Laterality: Right;   VIDEO ASSISTED THORACOSCOPY (VATS)/DECORTICATION Right 01/02/2016   Procedure: VIDEO ASSISTED THORACOSCOPY (VATS)/DECORTICATION;  Surgeon: Melrose Nakayama, MD;  Location: [place];  Service: Thoracic;  Laterality: Right;   VIDEO BRONCHOSCOPY N/A 01/02/2016   Procedure: VIDEO BRONCHOSCOPY;  Surgeon: Melrose Nakayama, MD;  Location: [facility];  Service: Thoracic;  Laterality: N/A;    Current Outpatient Medications  Medication Sig Dispense Refill   albuterol (VENTOLIN HFA) 108 (90 Base) MCG/ACT inhaler Inhale 1 puff into the lungs 4 (four) times daily as needed for wheezing or shortness of breath.     escitalopram (LEXAPRO) 20 MG tablet Take 20 mg by mouth daily.      fluticasone-salmeterol (ADVAIR) 250-50 MCG/ACT AEPB Inhale 1 puff into the lungs in the morning and at bedtime.     furosemide (LASIX) 20 MG tablet Take 1 tablet (20 mg total) by mouth daily. 90 tablet 3   loperamide (IMODIUM) 2 MG capsule Take 2 mg by mouth as needed for diarrhea or loose stools.     losartan (COZAAR) 25 MG tablet Take 25 mg by mouth daily.     methocarbamol (ROBAXIN) 750 MG tablet Take 750 mg by mouth 2 (two) times daily.     pantoprazole (PROTONIX) 40 MG tablet TAKE 1 TABLET BY MOUTH TWICE DAILY BEFORE MEALS 180 tablet 0   primidone (MYSOLINE) 250 MG tablet Take 250 mg by mouth 3 (three) times daily.     primidone (MYSOLINE) 50 MG tablet Take 50 mg by mouth 3 (three) times daily.     QUEtiapine (SEROQUEL) 25 MG tablet Take 25 mg by mouth at bedtime.     No current facility-administered medications for this visit.    Allergies as of 10/31/2022 - Review Complete 10/31/2022  Allergen Reaction Noted    Codeine Nausea Only 03/29/2022   Ibuprofen Nausea Only 03/29/2022   Morphine  11/21/2018   Morphine and related  11/21/2018   Tramadol Nausea Only 03/29/2022   Penicillins Nausea And Vomiting 03/06/2016    Family History  Problem Relation Age of Onset   CAD Mother 31       Sudden death   Diabetes Mellitus II Father    Prostate cancer Paternal Grandfather     Social History   Socioeconomic History   Marital status: Single    Spouse name: Not on file   Number of children: Not on file   Years of education: Not on file   Highest education level: Not on file  Occupational History   Not on file  Tobacco Use   Smoking status: Every Day    Packs/day: 0.25    Years: 10.00    Total pack years: 2.50    Types: Pipe, Cigarettes   Smokeless tobacco: Former  Quit date: 2013  Vaping Use   Vaping Use: Never used  Substance and Sexual Activity   Alcohol use: No    Comment: pt states he no longer drinks 2013   Drug use: No   Sexual activity: Not Currently  Other Topics Concern   Not on file  Social History Narrative   Lives in a group home.  Just got married.     Social Determinants of Health   Financial Resource Strain: Not on file  Food Insecurity: Not on file  Transportation Needs: Not on file  Physical Activity: Not on file  Stress: Not on file  Social Connections: Not on file   Review of systems General: negative for malaise, night sweats, fever, chills, +weight loss Neck: Negative for lumps, goiter, pain and significant neck swelling Resp: Negative for cough, wheezing, dyspnea at rest CV: Negative for chest pain, leg swelling, palpitations, orthopnea GI: denies melena, hematochezia, nausea, vomiting, constipation, dysphagia, odyonophagia, early satiety or unintentional weight loss. +diarrhea MSK: Negative for joint pain or swelling, back pain, and muscle pain. Derm: Negative for itching or rash Psych: Denies depression, anxiety, memory loss, confusion. No homicidal  or suicidal ideation.  Heme: Negative for prolonged bleeding, bruising easily, and swollen nodes. Endocrine: Negative for cold or heat intolerance, polyuria, polydipsia and goiter. Neuro: negative for tremor, gait imbalance, syncope and seizures. The remainder of the review of systems is noncontributory.  Physical Exam: BP 123/77 (BP Location: Left Arm, Patient Position: Sitting, Cuff Size: Small)   Pulse (!) 54   Temp 98.4 F (36.9 C) (Temporal)   Ht 5\' 11"  (1.803 m)   Wt 141 lb 3.2 oz (64 kg)   BMI 19.69 kg/m  General:   Alert and oriented. No distress noted. Pleasant and cooperative.  Head:  Normocephalic and atraumatic. Eyes:  Conjuctiva clear without scleral icterus. Mouth:  Oral mucosa pink and moist. Good dentition. No lesions. Heart: Normal rate and rhythm, s1 and s2 heart sounds present.  Lungs: Clear lung sounds in all lobes. Respirations equal and unlabored. Abdomen:  +BS, soft, non-tender and non-distended. No rebound or guarding. No HSM or masses noted. Derm: No palmar erythema or jaundice Msk:  Symmetrical without gross deformities. Normal posture. Extremities:  Without edema. Neurologic:  Alert and  oriented x4 Psych:  Alert and cooperative. Normal mood and affect.  Invalid input(s): "6 MONTHS"   ASSESSMENT: Taison Celani is a 46 y.o. male presenting today for follow up of GERD/dysphagia.  GERD/Dysphagia: well controlled on Protonix 40mg  BID. Dysphagia resolved with use of protonix twice daily. He has no UGI symptoms. Will continue with PPI BID.  Diarrhea: Ongoing for the past 2 years, intermittently. He is using Imodium for this with good results.  Reports formed solid stools in between episodes of diarrhea.  He denies rectal bleeding, melena, abdominal pain.  Feels that appetite is good.  He has noticed no correlation of symptoms and certain foods.  We will check TSH celiac panel to rule out underlying contributors to his diarrhea.  This could be some aspect of  IBS, though he has no abdominal pain.  I will provide a low FODMAP diet for him to take a look at. Last colonoscopy was more than 10 years ago and due to having any changes in his bowel habits, I did recommend proceeding with colonoscopy to which patient again declined.  He will make me aware if he changes his mind about proceeding with colonoscopy.    PLAN:  Check TSH and celiac  panel  2. Continue protonix 40mg  BID  3. Pt to make me aware if he wishes to proceed with Colonoscopy  4. Continue imodium PRN for diarrhea   5. Low fodmap diet  All questions were answered, patient verbalized understanding and is in agreement with plan as outlined above.    Follow Up: 6 months   Addi Pak L. Alver Sorrow, MSN, APRN, AGNP-C Adult-Gerontology Nurse Practitioner [facility]  I have reviewed the note and agree with the APP's assessment as described in this progress note  Patient responded to PPI BID, currently with adequate GERD control. May consider discussing TIF in next appointment as a way to spare him from long term PPI use.  Maylon Peppers, MD Gastroenterology and Hepatology [facility]

## 2022-10-31 NOTE — Patient Instructions (Signed)
Please continue protonix 40mg  twice a day We will check thyroid function and celiac panel to rule out causes of your diarrhea I would recommend colonoscopy not only for colon cancer screening but also due to changes in bowels with diarrhea over the past year and weight loss, please let me know if you change your mind about this You can continue to use imodium as needed I am providing the low FODMAP food guide as this can be helpful in implementing sometimes with diarrhea  Follow up 6 months

## 2022-11-01 LAB — CELIAC DISEASE PANEL
(tTG) Ab, IgA: <1 U/mL
(tTG) Ab, IgG: <1 U/mL
Gliadin IgA: <1 U/mL
Gliadin IgG: <1 U/mL
Immunoglobulin A: 240 mg/dL (ref 47–310)

## 2022-11-01 LAB — TSH: TSH: 2.07 mIU/L (ref 0.40–4.50)

## 2023-03-12 DIAGNOSIS — I890 Lymphedema, not elsewhere classified: Secondary | ICD-10-CM | POA: Insufficient documentation

## 2023-03-12 NOTE — Progress Notes (Deleted)
MRN : [id number]  [name] is a 47 y.o. ([birth year]) male who presents with chief complaint of legs swell.  History of Present Illness:   Patient is seen for evaluation of leg swelling. The patient first noticed the swelling remotely but is now concerned because of a significant increase in the overall edema. The swelling isn't associated with significant pain.  There has been an increasing amount of  discoloration noted by the patient. The patient notes that in the morning the legs are improved but they steadily worsened throughout the course of the day. Elevation seems to make the swelling of the legs better, dependency makes them much worse.   There is no history of ulcerations associated with the swelling.   The patient denies any recent changes in their medications.  The patient has not been wearing graduated compression.  The patient has no had any past angiography, interventions or vascular surgery.  The patient denies a history of DVT or PE. There is no prior history of phlebitis. There is no history of primary lymphedema.  There is no history of radiation treatment to the groin or pelvis No history of malignancies. No history of trauma or groin or pelvic surgery. No history of foreign travel or parasitic infections area   No outpatient medications have been marked as taking for the 03/13/23 encounter (Appointment) with Gilda Crease, Latina Craver, MD.    Past Medical History:  Diagnosis Date   Anxiety    Arthritis    knees and fingers   Asthma    CHF (congestive heart failure) (HCC)    COPD (chronic obstructive pulmonary disease) (HCC)    Depression    Encephalopathy    GERD (gastroesophageal reflux disease)    Hypertension    Hyponatremia 06/2016   Pneumonia 2017   Renal disorder    kidney injury    Past Surgical History:  Procedure Laterality Date   BIOPSY  05/10/2022   Procedure: BIOPSY;  Surgeon: Dolores Frame, MD;  [facility];   Service: Gastroenterology;;   COLONOSCOPY     ESOPHAGOGASTRODUODENOSCOPY     ESOPHAGOGASTRODUODENOSCOPY (EGD) WITH PROPOFOL N/A 05/10/2022   Procedure: ESOPHAGOGASTRODUODENOSCOPY (EGD) WITH PROPOFOL;  Surgeon: Dolores Frame, MD;  [facility];  Service: Gastroenterology;  Laterality: N/A;  205   EXTERNAL FIXATION LEG Right 12/01/2018   Procedure: EXTERNAL FIXATION LEG;  Surgeon: Juanell Fairly, MD;  Location: [facility];  Service: Orthopedics;  Laterality: Right;   EXTERNAL FIXATION REMOVAL Right 12/06/2018   Procedure: REMOVAL EXTERNAL FIXATION LEG;  Surgeon: Roby Lofts, MD;  Location: [facility];  Service: Orthopedics;  Laterality: Right;   FINGER SURGERY Left    5th digit-Fracture-"worse now than before"   HARDWARE REMOVAL Right 12/06/2019   Procedure: HARDWARE REMOVAL RIGHT TIBIAL PLATEAU;  Surgeon: Roby Lofts, MD;  Location: [facility];  Service: Orthopedics;  Laterality: Right;   ORIF TIBIA PLATEAU Right 12/06/2018   Procedure: OPEN REDUCTION INTERNAL FIXATION (ORIF) TIBIAL PLATEAU;  Surgeon: Roby Lofts, MD;  Location: [facility];  Service: Orthopedics;  Laterality: Right;   PLEURAL EFFUSION DRAINAGE Right 01/02/2016   Procedure: DRAINAGE OF PLEURAL EFFUSION;  Surgeon: Loreli Slot, MD;  Location: [facility] OR;  Service: Thoracic;  Laterality: Right;   VIDEO ASSISTED THORACOSCOPY (VATS)/DECORTICATION Right 01/02/2016   Procedure: VIDEO ASSISTED THORACOSCOPY (VATS)/DECORTICATION;  Surgeon: Loreli Slot, MD;  Location: [facility];  Service: Thoracic;  Laterality: Right;   VIDEO BRONCHOSCOPY N/A 01/02/2016  Procedure: VIDEO BRONCHOSCOPY;  Surgeon: Loreli Slot, MD;  Location: [facility];  Service: Thoracic;  Laterality: N/A;    Social History Social History   Tobacco Use   Smoking status: Every Day    Packs/day: 0.25    Years: 10.00    Additional pack years: 0.00    Total pack years: 2.50    Types: Pipe, Cigarettes   Smokeless tobacco: Former    Quit date:  2013  Vaping Use   Vaping Use: Never used  Substance Use Topics   Alcohol use: No    Comment: pt states he no longer drinks 2013   Drug use: No    Family History Family History  Problem Relation Age of Onset   CAD Mother 73       Sudden death   Diabetes Mellitus II Father    Prostate cancer Paternal Grandfather     Allergies  Allergen Reactions   Codeine Nausea Only   Ibuprofen Nausea Only   Morphine     UNSPECIFIED REACTION - shaking, diaphoresis   Morphine And Related     UNSPECIFIED REACTION - shaking, diaphoresis   Tramadol Nausea Only   Penicillins Nausea And Vomiting    Did it involve swelling of the face/tongue/throat, SOB, or low BP? No Did it involve sudden or severe rash/hives, skin peeling, or any reaction on the inside of your mouth or nose? No Did you need to seek medical attention at a hospital or doctor's office? No When did it last happen?      10+years If all above answers are "NO", may proceed with cephalosporin use.      REVIEW OF SYSTEMS (Negative unless checked)  Constitutional: Weight loss  Fever  Chills Cardiac: Chest pain   Chest pressure   Palpitations   Shortness of breath when laying flat   Shortness of breath with exertion. Vascular:  Pain in legs with walking   Pain in legs with standing  History of DVT   Phlebitis   Swelling in legs   Varicose veins   Non-healing ulcers Pulmonary:   Uses home oxygen   Productive cough   Hemoptysis   Wheeze  COPD   Asthma Neurologic:  Dizziness   Seizures   History of stroke   History of TIA  Aphasia   Vissual changes   Weakness or numbness in arm   Weakness or numbness in leg Musculoskeletal:   Joint swelling   Joint pain   Low back pain Hematologic:  Easy bruising  Easy bleeding   Hypercoagulable state   Anemic Gastrointestinal:  Diarrhea   Vomiting  Gastroesophageal reflux/heartburn   Difficulty swallowing. Genitourinary:   Chronic kidney disease   Difficult urination  Frequent urination   Blood in urine Skin:  Rashes   Ulcers  Psychological:  History of anxiety    History of major depression.  Physical Examination  There were no vitals filed for this visit. There is no height or weight on file to calculate BMI. Gen: WD/WN, NAD Head: [place]/AT, No temporalis wasting.  Ear/Nose/Throat: Hearing grossly intact, nares w/o erythema or drainage, pinna without lesions Eyes: PER, EOMI, sclera nonicteric.  Neck: Supple, no gross masses.  No JVD.  Pulmonary:  Good air movement, no audible wheezing, no use of accessory muscles.  Cardiac: RRR, precordium not hyperdynamic. Vascular:  scattered varicosities present bilaterally.  Mild venous stasis changes to the legs bilaterally.  3-4+ soft pitting edema, CEAP [id number]  Vessel Right Left  Radial Palpable Palpable  Gastrointestinal: soft, non-distended. No guarding/no peritoneal signs.  Musculoskeletal: M/S 5/5 throughout.  No deformity.  Neurologic: CN 2-12 intact. Pain and light touch intact in extremities.  Symmetrical.  Speech is fluent. Motor exam as listed above. Psychiatric: Judgment intact, Mood & affect appropriate for pt's clinical situation. Dermatologic: Venous rashes no ulcers noted.  No changes consistent with cellulitis. Lymph : No lichenification or skin changes of chronic lymphedema.  CBC Lab Results  Component Value Date   WBC 5.0 10/11/2022   HGB 14.6 10/11/2022   HCT 42.2 10/11/2022   MCV 93.8 10/11/2022   PLT 216 10/11/2022    BMET    Component Value Date/Time   NA 138 05/09/2022 1145   NA 138 09/11/2014 2309   K 3.6 05/09/2022 1145   K 3.9 09/11/2014 2309   CL 105 05/09/2022 1145   CL 104 09/11/2014 2309   CO2 27 05/09/2022 1145   CO2 26 09/11/2014 2309   GLUCOSE 56 (L) 05/09/2022 1145   GLUCOSE 96 09/11/2014 2309   BUN 5 (L) 05/09/2022 1145   BUN 2 (L) 09/11/2014 2309   CREATININE 0.64 05/09/2022 1145   CREATININE  0.84 09/11/2014 2309   CALCIUM 8.9 05/09/2022 1145   CALCIUM 8.5 09/11/2014 2309   GFRNONAA >60 05/09/2022 1145   GFRNONAA >60 09/11/2014 2309   GFRNONAA >60 07/13/2014 2344   GFRAA >60 12/06/2019 0640   GFRAA >60 09/11/2014 2309   GFRAA >60 07/13/2014 2344   CrCl cannot be calculated (Patient's most recent lab result is older than the maximum 21 days allowed.).  COAG Lab Results  Component Value Date   INR 1.0 04/27/2021   INR 1.44 01/01/2016   INR 0.98 11/07/2013    Radiology No results found.   Assessment/Plan There are no diagnoses linked to this encounter.   Levora Dredge, MD  03/12/2023 10:25 AM

## 2023-03-13 ENCOUNTER — Encounter (INDEPENDENT_AMBULATORY_CARE_PROVIDER_SITE_OTHER): Payer: 59 | Admitting: Vascular Surgery

## 2023-03-13 DIAGNOSIS — I5032 Chronic diastolic (congestive) heart failure: Secondary | ICD-10-CM

## 2023-03-13 DIAGNOSIS — J449 Chronic obstructive pulmonary disease, unspecified: Secondary | ICD-10-CM

## 2023-03-13 DIAGNOSIS — K219 Gastro-esophageal reflux disease without esophagitis: Secondary | ICD-10-CM

## 2023-03-13 DIAGNOSIS — I890 Lymphedema, not elsewhere classified: Secondary | ICD-10-CM

## 2023-04-26 DIAGNOSIS — E669 Obesity, unspecified: Secondary | ICD-10-CM | POA: Diagnosis not present

## 2023-04-26 DIAGNOSIS — J449 Chronic obstructive pulmonary disease, unspecified: Secondary | ICD-10-CM | POA: Diagnosis not present

## 2023-04-26 DIAGNOSIS — F609 Personality disorder, unspecified: Secondary | ICD-10-CM | POA: Diagnosis not present

## 2023-04-26 DIAGNOSIS — E119 Type 2 diabetes mellitus without complications: Secondary | ICD-10-CM | POA: Diagnosis not present

## 2023-04-26 DIAGNOSIS — G629 Polyneuropathy, unspecified: Secondary | ICD-10-CM | POA: Diagnosis not present

## 2023-04-26 DIAGNOSIS — F419 Anxiety disorder, unspecified: Secondary | ICD-10-CM | POA: Diagnosis not present

## 2023-04-26 DIAGNOSIS — F209 Schizophrenia, unspecified: Secondary | ICD-10-CM | POA: Diagnosis not present

## 2023-05-01 ENCOUNTER — Encounter (INDEPENDENT_AMBULATORY_CARE_PROVIDER_SITE_OTHER): Payer: 59 | Admitting: Vascular Surgery

## 2023-05-02 DIAGNOSIS — F39 Unspecified mood [affective] disorder: Secondary | ICD-10-CM | POA: Diagnosis not present

## 2023-05-02 DIAGNOSIS — M25561 Pain in right knee: Secondary | ICD-10-CM | POA: Diagnosis not present

## 2023-05-02 DIAGNOSIS — M790 Rheumatism, unspecified: Secondary | ICD-10-CM | POA: Diagnosis not present

## 2023-05-02 DIAGNOSIS — F1120 Opioid dependence, uncomplicated: Secondary | ICD-10-CM | POA: Diagnosis not present

## 2023-05-02 DIAGNOSIS — G8929 Other chronic pain: Secondary | ICD-10-CM | POA: Diagnosis not present

## 2023-05-08 ENCOUNTER — Ambulatory Visit (INDEPENDENT_AMBULATORY_CARE_PROVIDER_SITE_OTHER): Payer: Medicare Other | Admitting: Gastroenterology

## 2023-05-08 ENCOUNTER — Encounter (INDEPENDENT_AMBULATORY_CARE_PROVIDER_SITE_OTHER): Payer: Self-pay | Admitting: Gastroenterology

## 2023-05-15 DIAGNOSIS — K219 Gastro-esophageal reflux disease without esophagitis: Secondary | ICD-10-CM | POA: Diagnosis not present

## 2023-05-15 DIAGNOSIS — E782 Mixed hyperlipidemia: Secondary | ICD-10-CM | POA: Diagnosis not present

## 2023-05-15 DIAGNOSIS — I10 Essential (primary) hypertension: Secondary | ICD-10-CM | POA: Diagnosis not present

## 2023-05-15 DIAGNOSIS — J449 Chronic obstructive pulmonary disease, unspecified: Secondary | ICD-10-CM | POA: Diagnosis not present

## 2023-06-02 IMAGING — DX DG CHEST 1V PORT
1 series · 1 of 1 positions shown · non-contrast
Comparison: December 01, 2018.

CLINICAL DATA: Chest pain.

EXAM:
PORTABLE CHEST 1 VIEW

[chest ap]
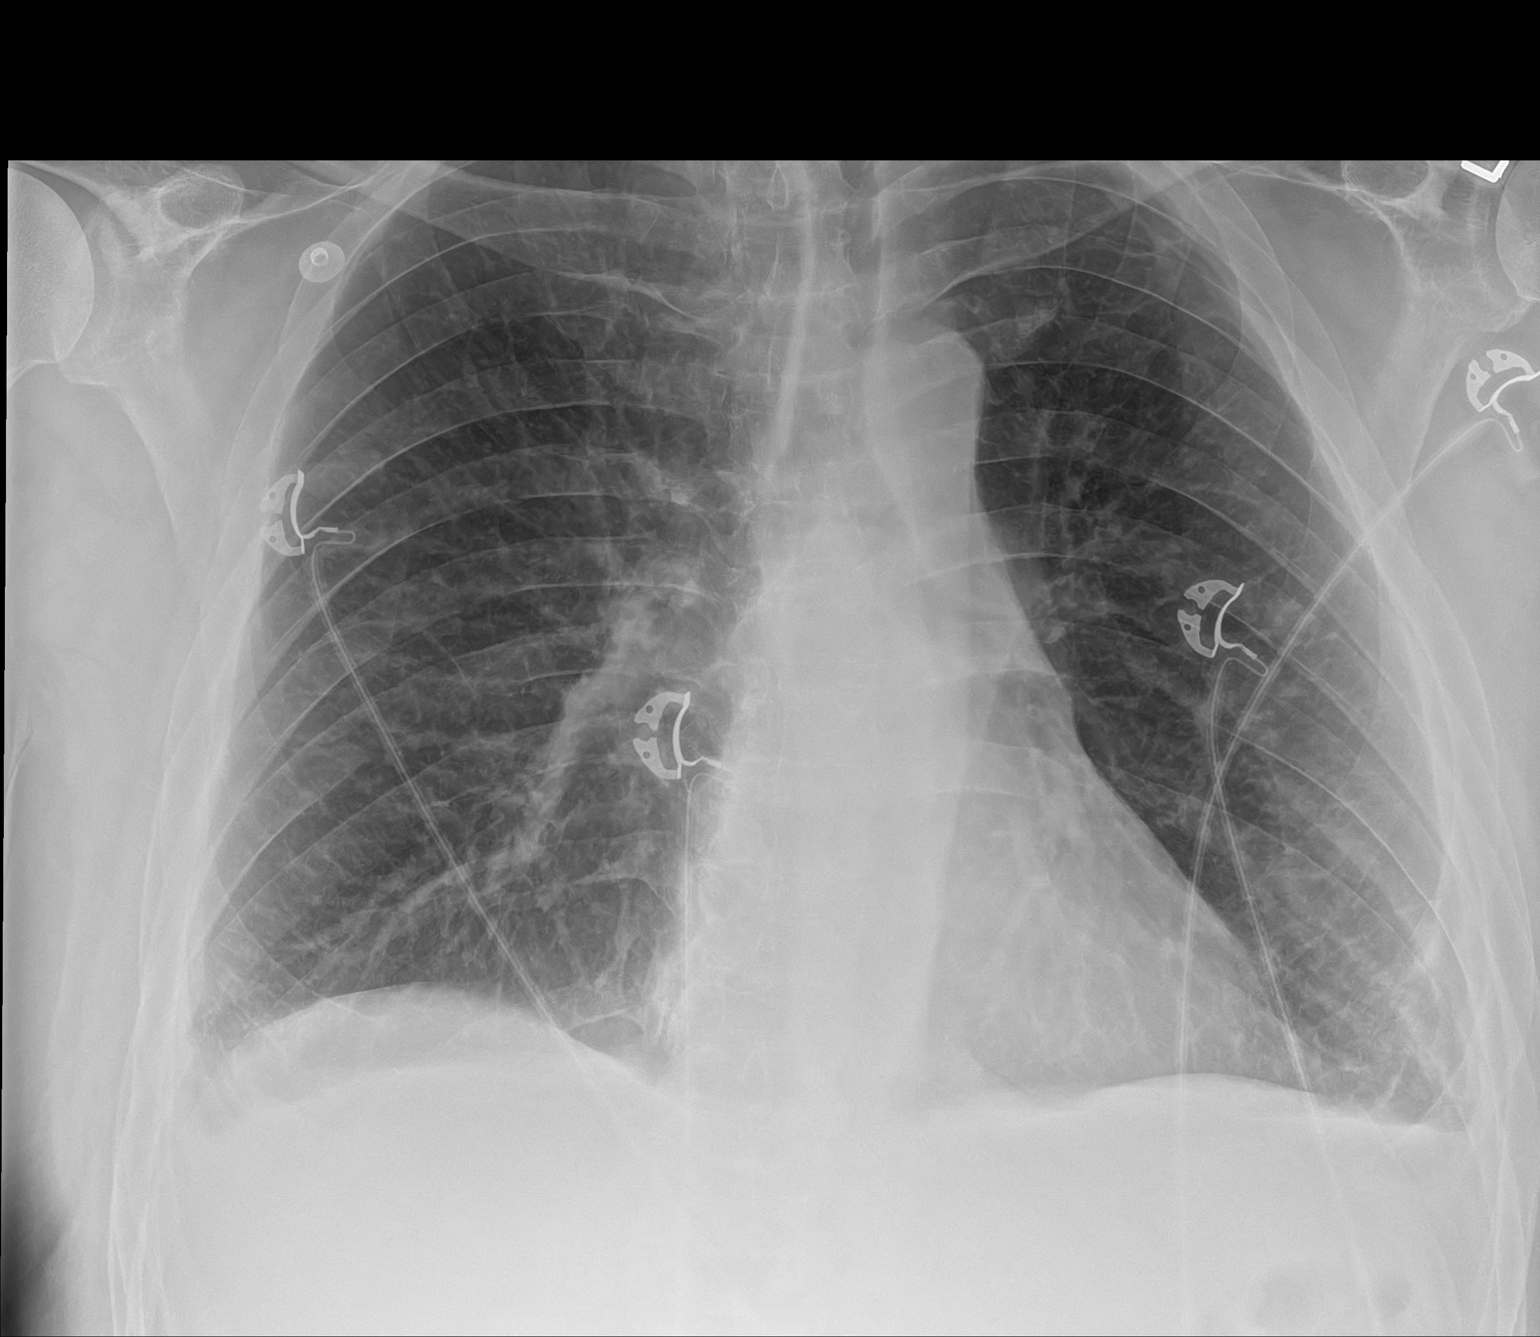

[1 of 1 positions shown; findings below may reference images not displayed]

FINDINGS: The heart size and mediastinal contours are within normal limits. No
pneumothorax is noted. Stable bibasilar opacities are noted
concerning for scarring. No definite acute abnormality is noted. The
visualized skeletal structures are unremarkable.
IMPRESSION: No active disease.

## 2023-06-08 DIAGNOSIS — F1320 Sedative, hypnotic or anxiolytic dependence, uncomplicated: Secondary | ICD-10-CM | POA: Diagnosis not present

## 2023-06-08 DIAGNOSIS — G8929 Other chronic pain: Secondary | ICD-10-CM | POA: Diagnosis not present

## 2023-06-08 DIAGNOSIS — F1120 Opioid dependence, uncomplicated: Secondary | ICD-10-CM | POA: Diagnosis not present

## 2023-06-08 DIAGNOSIS — F39 Unspecified mood [affective] disorder: Secondary | ICD-10-CM | POA: Diagnosis not present

## 2023-06-08 DIAGNOSIS — M790 Rheumatism, unspecified: Secondary | ICD-10-CM | POA: Diagnosis not present

## 2023-06-08 DIAGNOSIS — M25561 Pain in right knee: Secondary | ICD-10-CM | POA: Diagnosis not present

## 2023-06-10 DIAGNOSIS — F332 Major depressive disorder, recurrent severe without psychotic features: Secondary | ICD-10-CM | POA: Diagnosis not present

## 2023-06-11 DIAGNOSIS — F332 Major depressive disorder, recurrent severe without psychotic features: Secondary | ICD-10-CM | POA: Diagnosis not present

## 2023-06-12 DIAGNOSIS — F332 Major depressive disorder, recurrent severe without psychotic features: Secondary | ICD-10-CM | POA: Diagnosis not present

## 2023-06-17 IMAGING — CT CT ABD-PELV W/ CM
2 of 5 series · 16 of 46 positions shown, 18 images · IV contrast (Omnipaque or Isovue)
Comparison: CT 06/27/2016

CLINICAL DATA: Left lower quadrant abdominal pain. Diarrhea.
Symptoms for 1 month.

EXAM:
CT ABDOMEN AND PELVIS WITH CONTRAST
TECHNIQUE: Multidetector CT imaging of the abdomen and pelvis was performed
using the standard protocol following bolus administration of
intravenous contrast.
CONTRAST:  100mL OMNIPAQUE IOHEXOL 300 MG/ML  SOLN

[Series 2: axial st · axial · 0.84mm/px · z∈[-856,-461]mm · 13 of 89 slices shown, 15 images]
[im 5/89  soft-tissue]
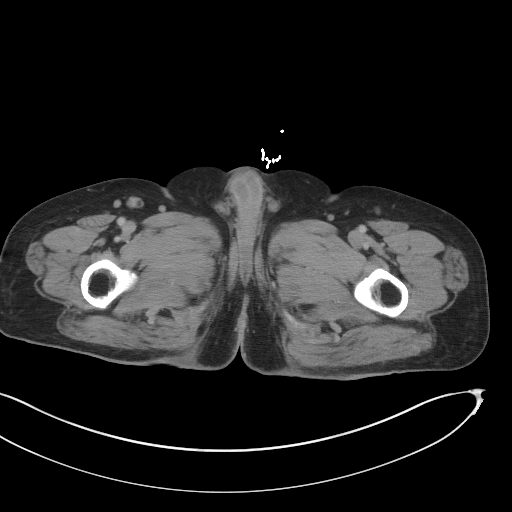
[im 5/89  bone]
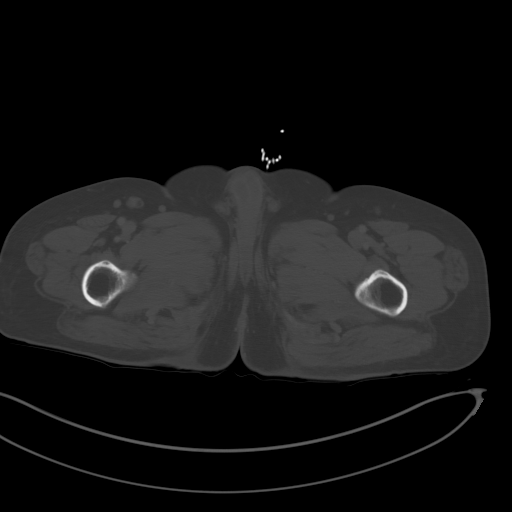
[im 14/89  soft-tissue]
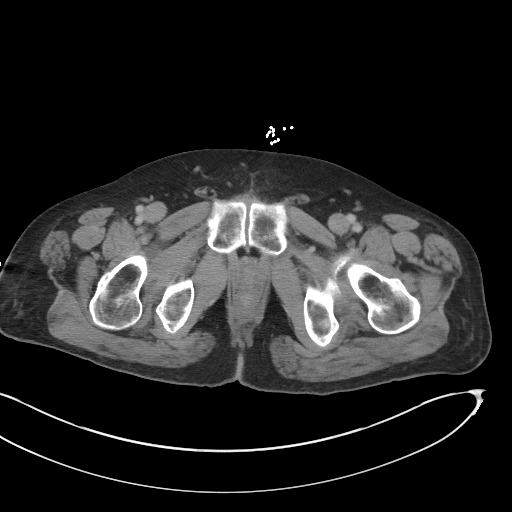
[im 19/89  soft-tissue]
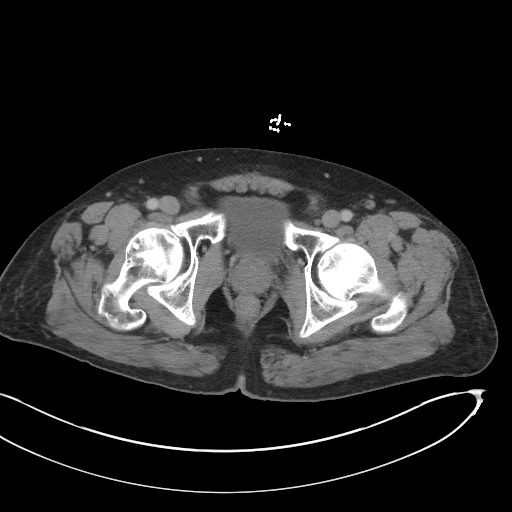
[im 24/89  soft-tissue]
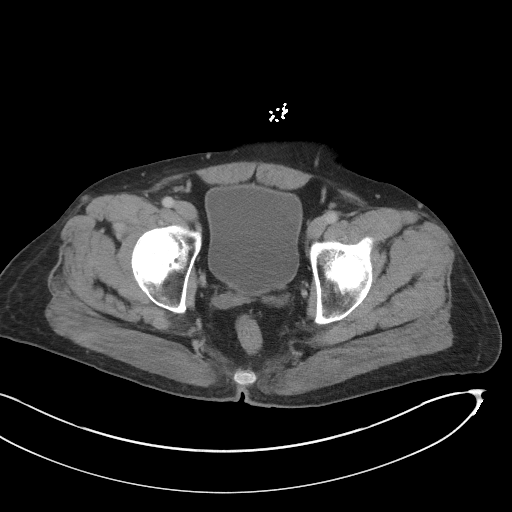
[im 33/89  soft-tissue]
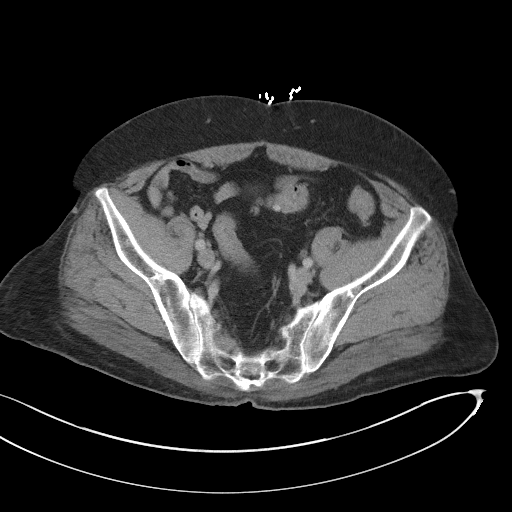
[im 38/89  soft-tissue]
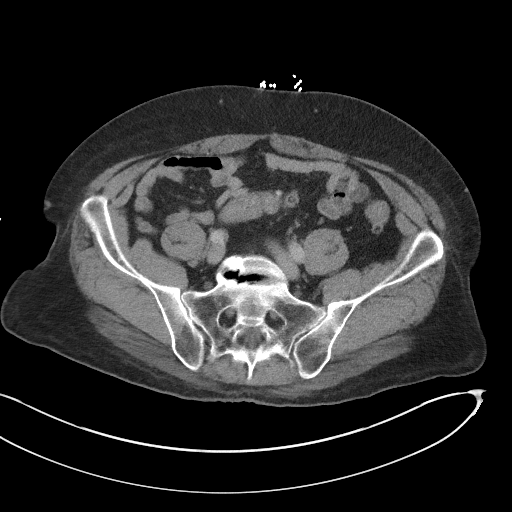
[im 47/89  soft-tissue]
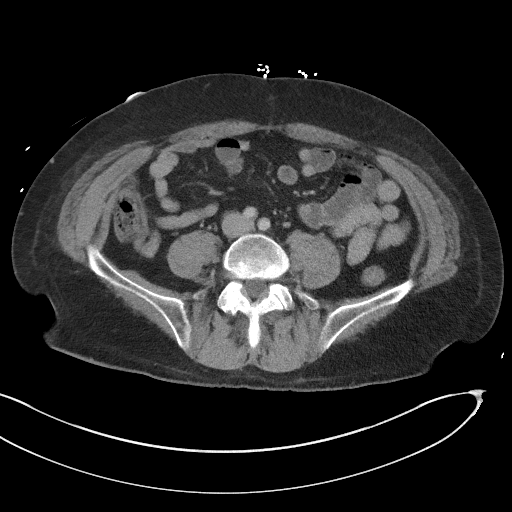
[im 51/89  soft-tissue]
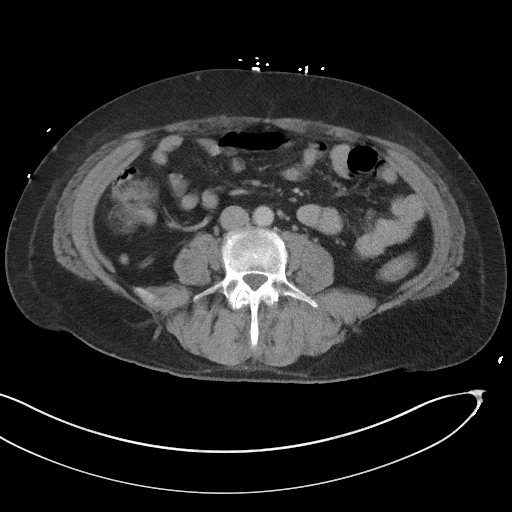
[im 56/89  soft-tissue]
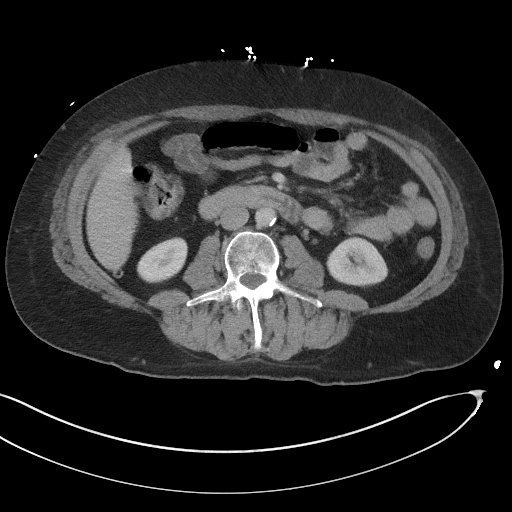
[im 56/89  bone]
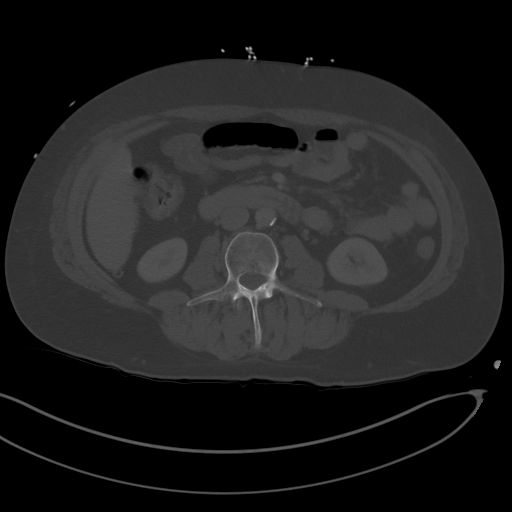
[im 65/89  soft-tissue]
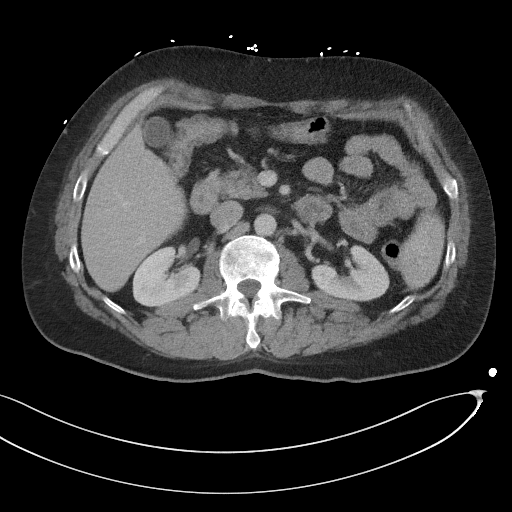
[im 70/89  soft-tissue]
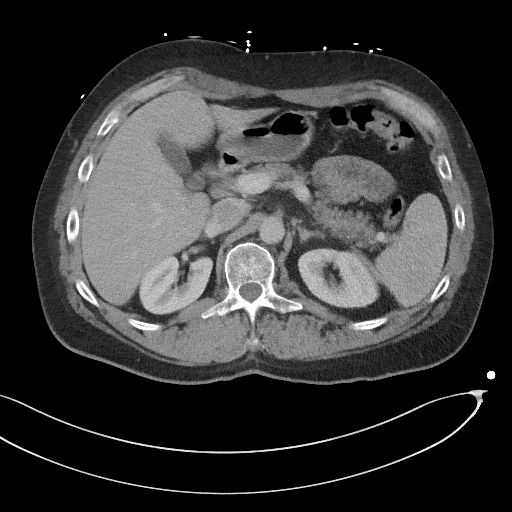
[im 75/89  soft-tissue]
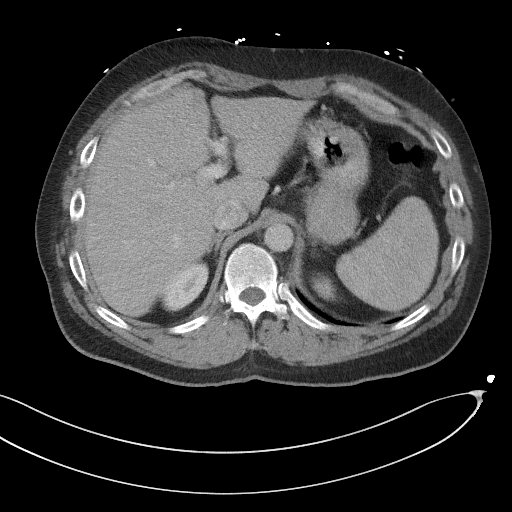
[im 84/89  soft-tissue]
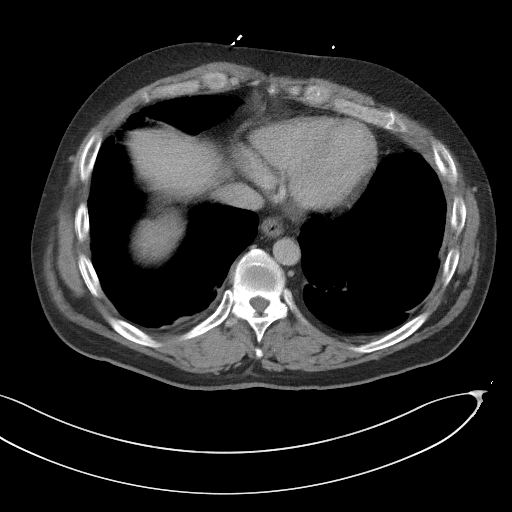

[Series 5: coronal st · coronal · 0.85mm/px · 3 of 111 slices shown]
[im 37/111  soft-tissue]
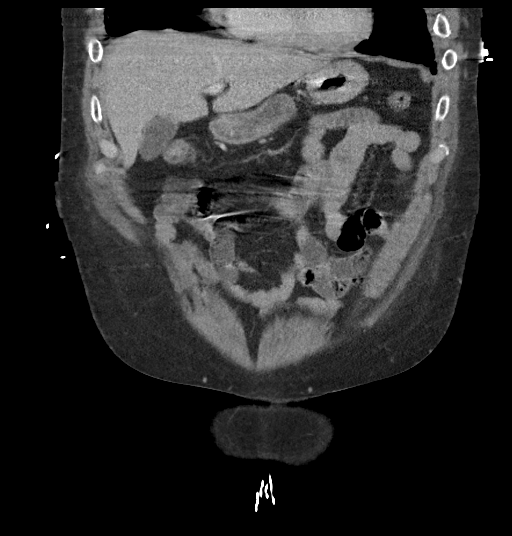
[im 49/111  soft-tissue]
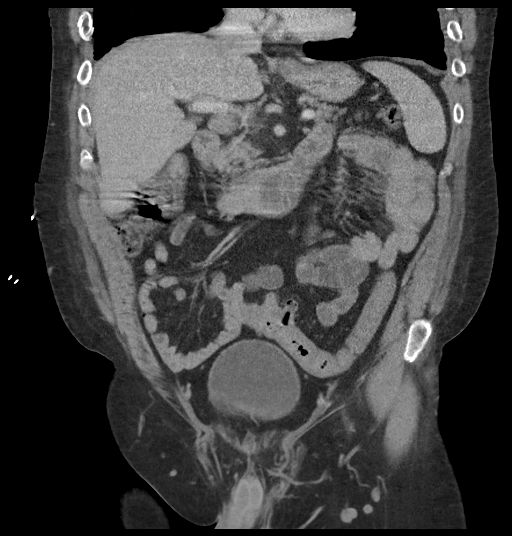
[im 62/111  soft-tissue]
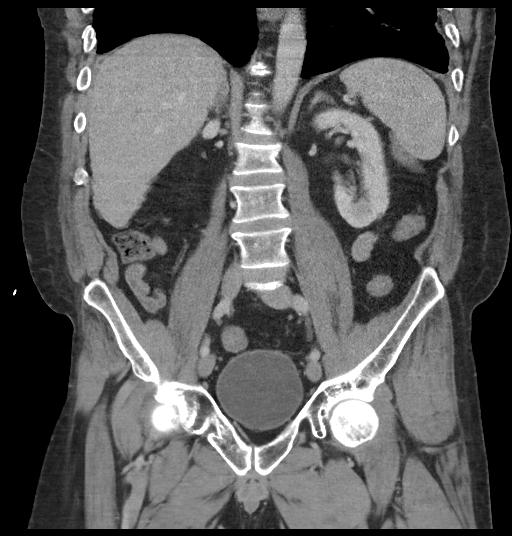

[16 of 46 positions shown; findings below may reference images not displayed]

FINDINGS: Lower chest: Minimal right pleural thickening. No acute airspace
disease. Normal heart size.

Hepatobiliary: No focal liver abnormality is seen. No gallstones,
gallbladder wall thickening, or biliary dilatation.

Pancreas: No ductal dilatation or inflammation.

Spleen: Normal in size without focal abnormality.

Adrenals/Urinary Tract: Normal adrenal glands. No hydronephrosis or
perinephric edema. Homogeneous renal enhancement with symmetric
excretion on delayed phase imaging. Urinary bladder is
physiologically distended without wall thickening.

Stomach/Bowel: Left colonic diverticulosis. There is faint
pericolonic stranding lead is sigmoid diverticula that may represent
acute diverticulitis, series 2, image 50. No abscess or perforation.
No associated colonic wall thickening. Normal appendix. Decompressed
stomach. Unremarkable small bowel without obstruction or
inflammation.

Vascular/Lymphatic: Mild aortic atherosclerosis no aortic aneurysm.
Patent portal vein. No portal venous or mesenteric gas. No enlarged
lymph nodes in the abdomen or pelvis.

Reproductive: Prostate is unremarkable.

Other: No free air, free fluid, or intra-abdominal fluid collection.

Musculoskeletal: Chronic L2 superior endplate compression fracture,
seen on lumbar spine 12/01/2018. Remote right pubic rami fractures.
IMPRESSION: Left colonic diverticulosis with faint pericolonic stranding
adjacent to sigmoid diverticula that may represent acute
diverticulitis. No perforation or abscess.

Aortic Atherosclerosis ([id number]).

## 2023-06-17 IMAGING — DX DG CHEST 2V
2 series · 2 of 2 positions shown · non-contrast
Comparison: 04/27/2021

CLINICAL DATA: Short of breath.

EXAM:
CHEST - 2 VIEW

[chest pa]
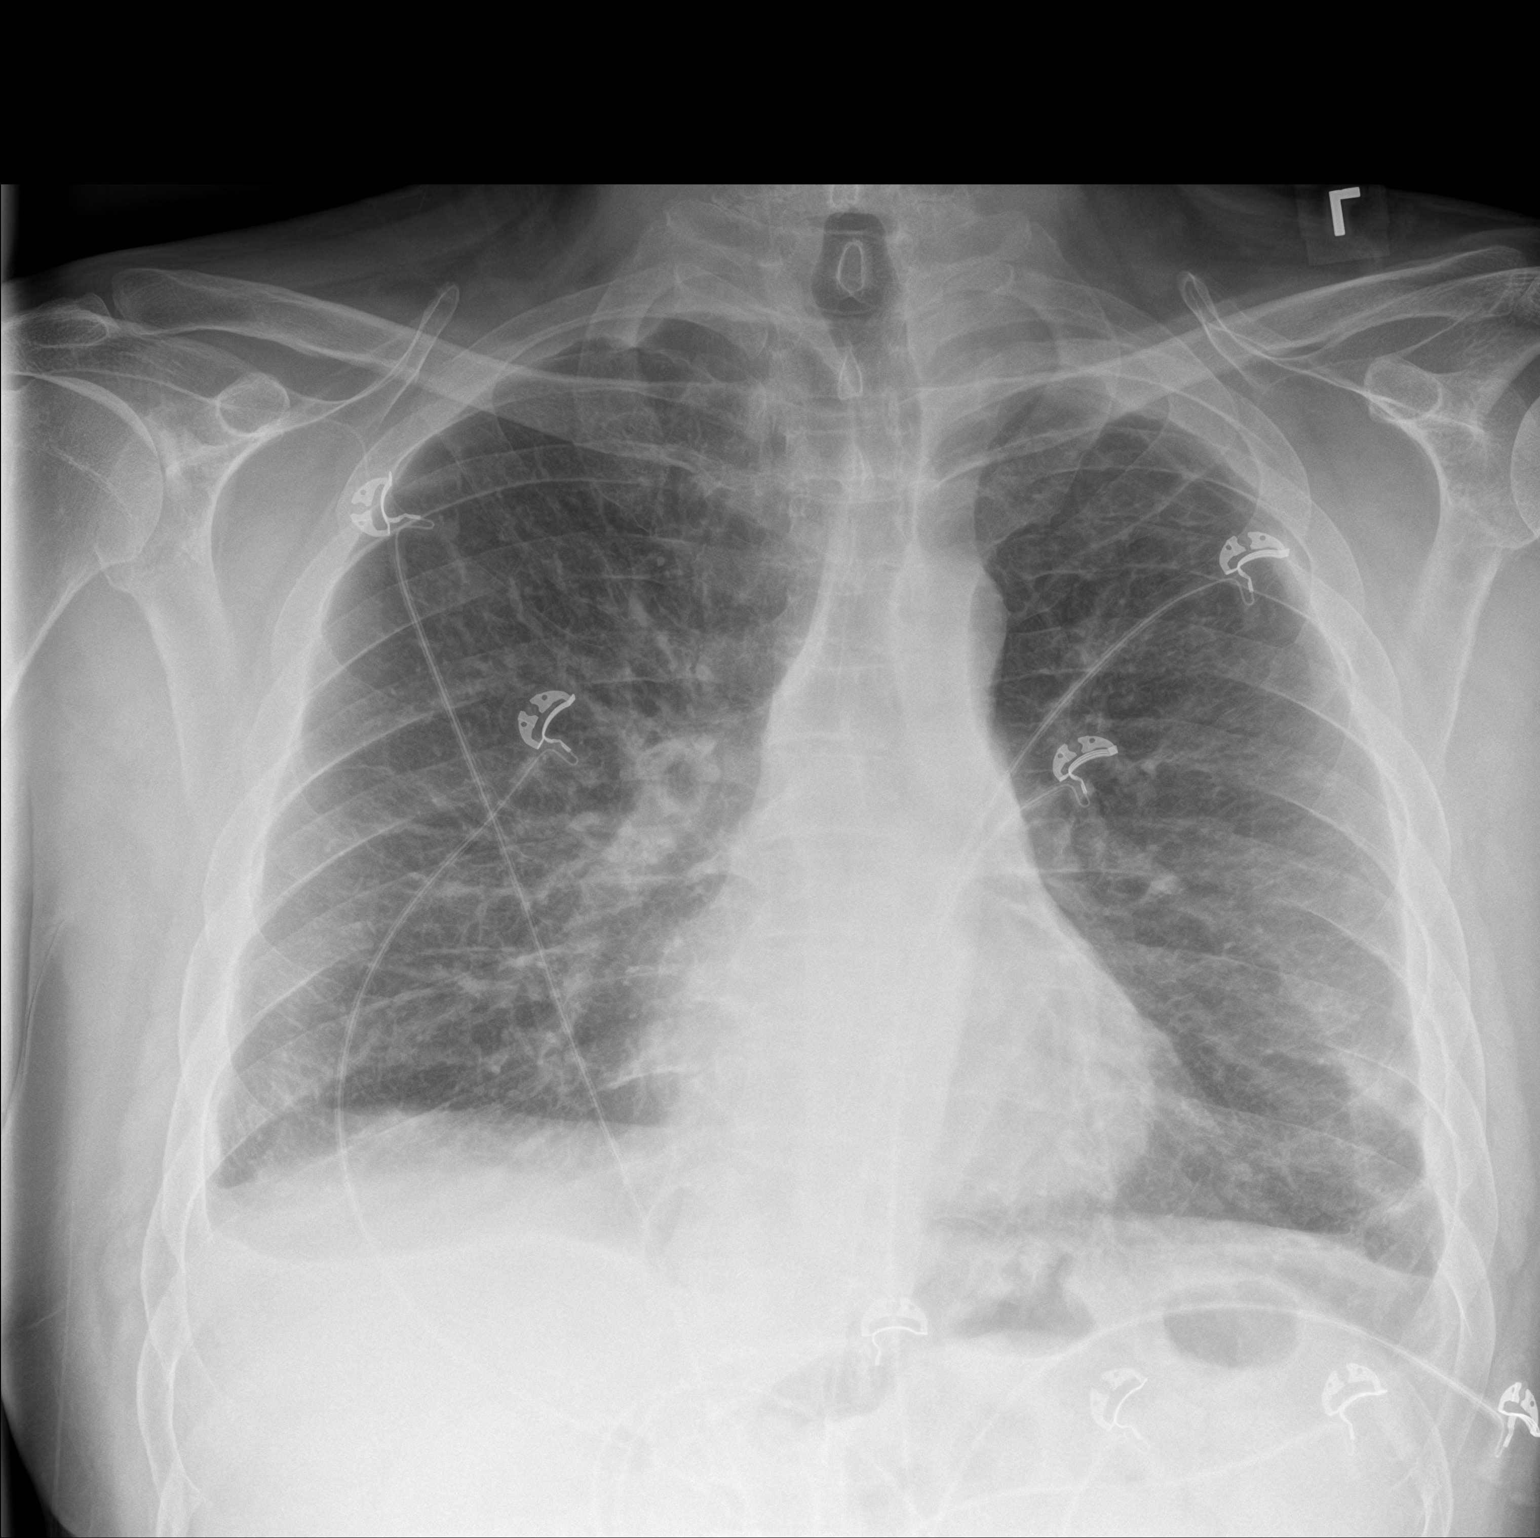

[chest lat]
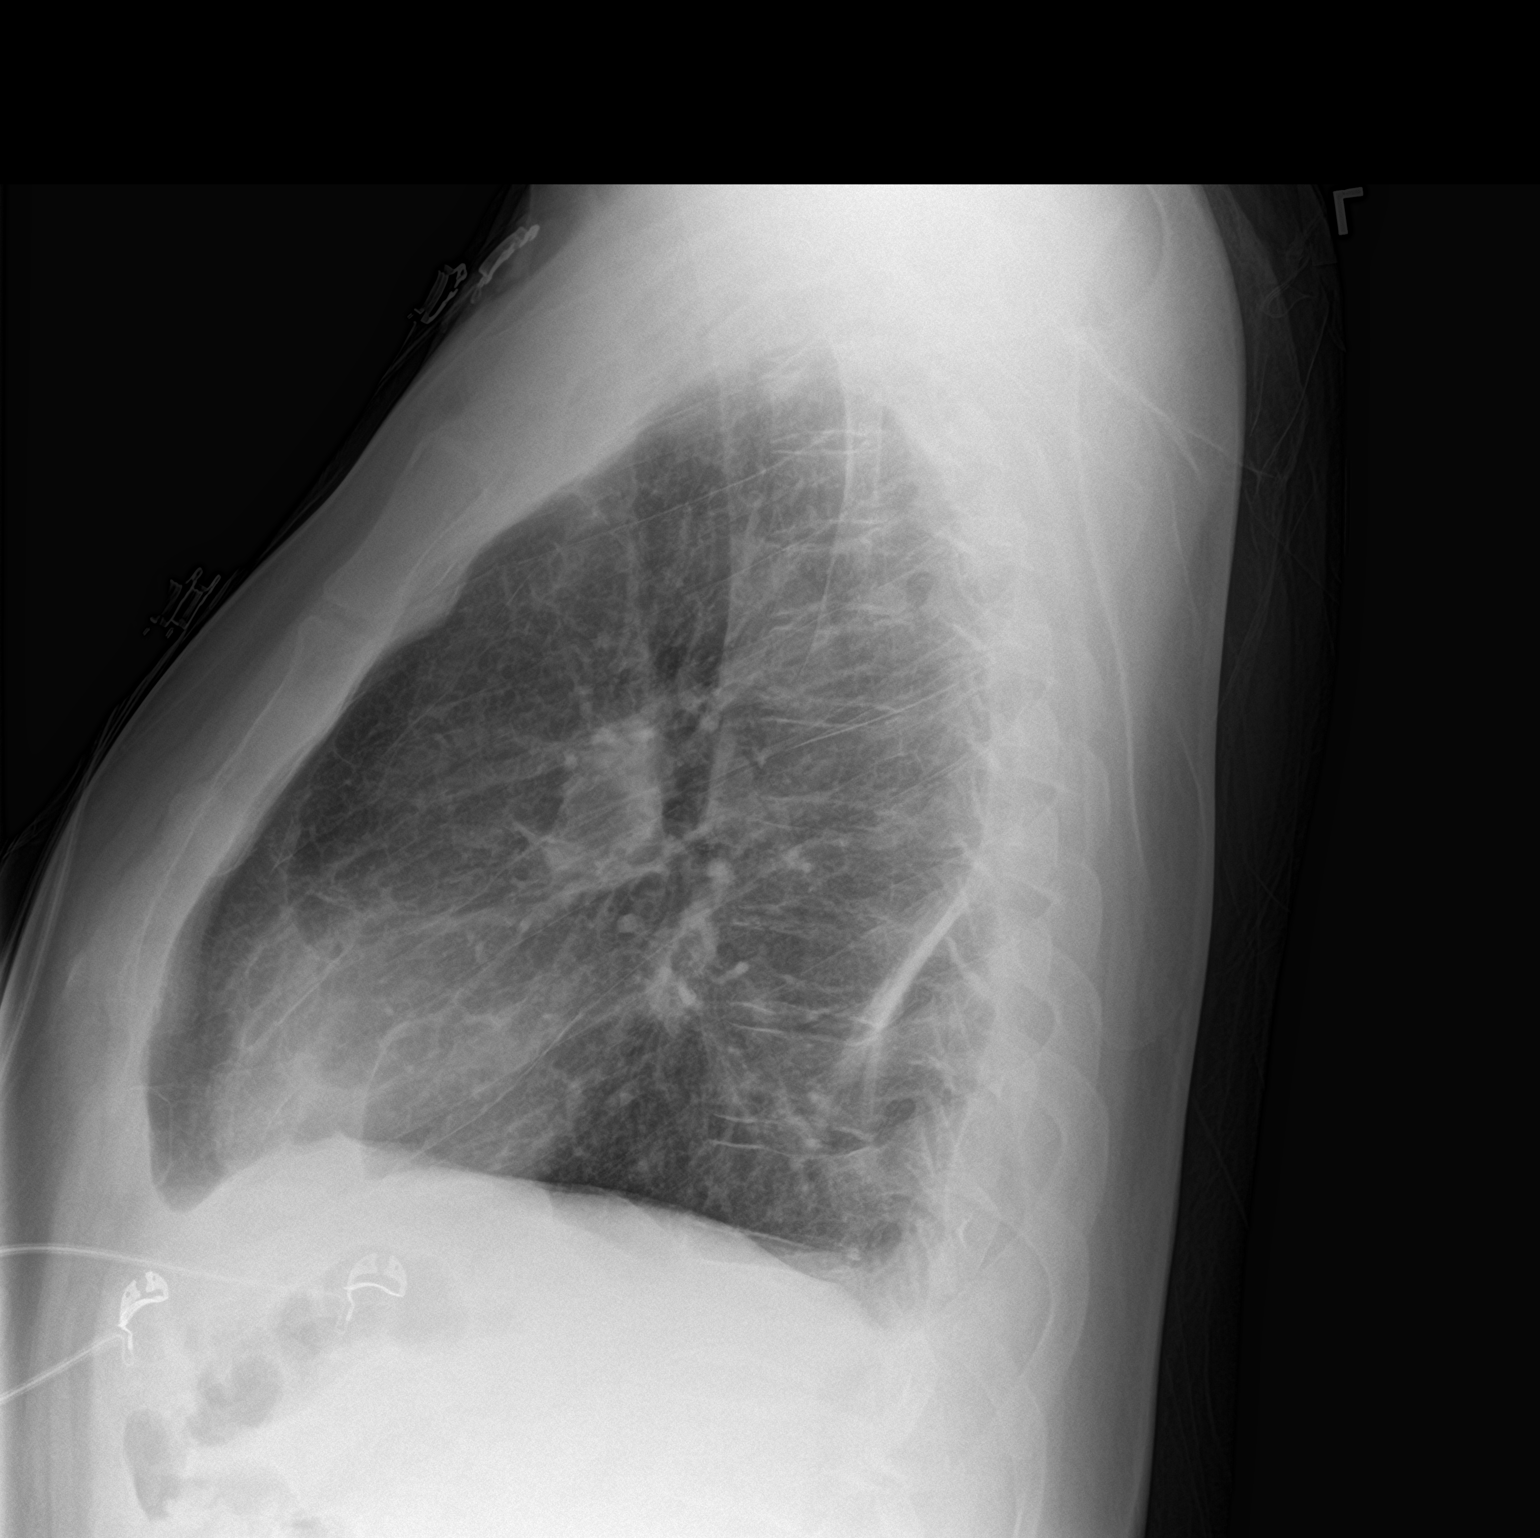

[2 of 2 positions shown; findings below may reference images not displayed]

FINDINGS: Lungs are hyperinflated. Normal pneumothorax. Small bilateral
pleural effusions. No overt pulmonary edema. No pneumothorax. Mild
interstitial pattern suggests interstitial edema.
IMPRESSION: Hyperinflated lungs with mild interstitial edema pattern.

## 2023-06-25 NOTE — Progress Notes (Deleted)
MRN : [id number]  [name] is a 47 y.o. (04-22-1976) male who presents with chief complaint of legs swell.  History of Present Illness:   Patient is seen for evaluation of leg swelling. The patient first noticed the swelling remotely but is now concerned because of a significant increase in the overall edema. The swelling isn't associated with significant pain.  There has been an increasing amount of  discoloration noted by the patient. The patient notes that in the morning the legs are improved but they steadily worsened throughout the course of the day. Elevation seems to make the swelling of the legs better, dependency makes them much worse.   There is no history of ulcerations associated with the swelling.   The patient denies any recent changes in their medications.  The patient has not been wearing graduated compression.  The patient has no had any past angiography, interventions or vascular surgery.  The patient denies a history of DVT or PE. There is no prior history of phlebitis. There is no history of primary lymphedema.  There is no history of radiation treatment to the groin or pelvis No history of malignancies. No history of trauma or groin or pelvic surgery. No history of foreign travel or parasitic infections area   No outpatient medications have been marked as taking for the 06/26/23 encounter (Appointment) with Gilda Crease, Latina Craver, MD.    Past Medical History:  Diagnosis Date   Anxiety    Arthritis    knees and fingers   Asthma    CHF (congestive heart failure) (HCC)    COPD (chronic obstructive pulmonary disease) (HCC)    Depression    Encephalopathy    GERD (gastroesophageal reflux disease)    Hypertension    Hyponatremia 06/2016   Pneumonia 2017   Renal disorder    kidney injury    Past Surgical History:  Procedure Laterality Date   BIOPSY  05/10/2022   Procedure: BIOPSY;  Surgeon: Dolores Frame, MD;   Location: [facility];  Service: Gastroenterology;;   COLONOSCOPY     ESOPHAGOGASTRODUODENOSCOPY     ESOPHAGOGASTRODUODENOSCOPY (EGD) WITH PROPOFOL N/A 05/10/2022   Procedure: ESOPHAGOGASTRODUODENOSCOPY (EGD) WITH PROPOFOL;  Surgeon: Dolores Frame, MD;  Location: [facility];  Service: Gastroenterology;  Laterality: N/A;  205   EXTERNAL FIXATION LEG Right 12/01/2018   Procedure: EXTERNAL FIXATION LEG;  Surgeon: Juanell Fairly, MD;  Location: [facility];  Service: Orthopedics;  Laterality: Right;   EXTERNAL FIXATION REMOVAL Right 12/06/2018   Procedure: REMOVAL EXTERNAL FIXATION LEG;  Surgeon: Roby Lofts, MD;  Location: [facility];  Service: Orthopedics;  Laterality: Right;   FINGER SURGERY Left    5th digit-Fracture-"worse now than before"   HARDWARE REMOVAL Right 12/06/2019   Procedure: HARDWARE REMOVAL RIGHT TIBIAL PLATEAU;  Surgeon: Roby Lofts, MD;  Location: [facility];  Service: Orthopedics;  Laterality: Right;   ORIF TIBIA PLATEAU Right 12/06/2018   Procedure: OPEN REDUCTION INTERNAL FIXATION (ORIF) TIBIAL PLATEAU;  Surgeon: Roby Lofts, MD;  Location: [facility];  Service: Orthopedics;  Laterality: Right;   PLEURAL EFFUSION DRAINAGE Right 01/02/2016   Procedure: DRAINAGE OF PLEURAL EFFUSION;  Surgeon: Loreli Slot, MD;  Location: [facility] OR;  Service: Thoracic;  Laterality: Right;   VIDEO ASSISTED THORACOSCOPY (VATS)/DECORTICATION Right 01/02/2016   Procedure: VIDEO ASSISTED THORACOSCOPY (  VATS)/DECORTICATION;  Surgeon: Loreli Slot, MD;  Location: [facility] OR;  Service: Thoracic;  Laterality: Right;   VIDEO BRONCHOSCOPY N/A 01/02/2016   Procedure: VIDEO BRONCHOSCOPY;  Surgeon: Loreli Slot, MD;  Location: [facility] OR;  Service: Thoracic;  Laterality: N/A;    Social History Social History   Tobacco Use   Smoking status: Every Day    Current packs/day: 0.25    Average packs/day: 0.3 packs/day for 10.0 years (2.5 ttl pk-yrs)    Types: Pipe, Cigarettes   Smokeless  tobacco: Former    Quit date: 2013  Vaping Use   Vaping status: Never Used  Substance Use Topics   Alcohol use: No    Comment: pt states he no longer drinks 2013   Drug use: No    Family History Family History  Problem Relation Age of Onset   CAD Mother 49       Sudden death   Diabetes Mellitus II Father    Prostate cancer Paternal Grandfather     Allergies  Allergen Reactions   Codeine Nausea Only   Ibuprofen Nausea Only   Morphine     UNSPECIFIED REACTION - shaking, diaphoresis   Morphine And Codeine     UNSPECIFIED REACTION - shaking, diaphoresis   Tramadol Nausea Only   Penicillins Nausea And Vomiting    Did it involve swelling of the face/tongue/throat, SOB, or low BP? No Did it involve sudden or severe rash/hives, skin peeling, or any reaction on the inside of your mouth or nose? No Did you need to seek medical attention at a hospital or doctor's office? No When did it last happen?      10+years If all above answers are "NO", may proceed with cephalosporin use.      REVIEW OF SYSTEMS (Negative unless checked)  Constitutional: [] Weight loss  [] Fever  [] Chills Cardiac: [] Chest pain   [] Chest pressure   [] Palpitations   [] Shortness of breath when laying flat   [] Shortness of breath with exertion. Vascular:  [] Pain in legs with walking   [x] Pain in legs with standing  [] History of DVT   [] Phlebitis   [x] Swelling in legs   [] Varicose veins   [] Non-healing ulcers Pulmonary:   [] Uses home oxygen   [] Productive cough   [] Hemoptysis   [] Wheeze  [x] COPD   [] Asthma Neurologic:  [] Dizziness   [] Seizures   [] History of stroke   [] History of TIA  [] Aphasia   [] Vissual changes   [] Weakness or numbness in arm   [] Weakness or numbness in leg Musculoskeletal:   [] Joint swelling   [] Joint pain   [] Low back pain Hematologic:  [] Easy bruising  [] Easy bleeding   [] Hypercoagulable state   [] Anemic Gastrointestinal:  [] Diarrhea   [] Vomiting  [x] Gastroesophageal reflux/heartburn    [] Difficulty swallowing. Genitourinary:  [] Chronic kidney disease   [] Difficult urination  [] Frequent urination   [] Blood in urine Skin:  [] Rashes   [] Ulcers  Psychological:  [] History of anxiety   []  History of major depression.  Physical Examination  There were no vitals filed for this visit. There is no height or weight on file to calculate BMI. Gen: WD/WN, NAD Head: [place]/AT, No temporalis wasting.  Ear/Nose/Throat: Hearing grossly intact, nares w/o erythema or drainage, pinna without lesions Eyes: PER, EOMI, sclera nonicteric.  Neck: Supple, no gross masses.  No JVD.  Pulmonary:  Good air movement, no audible wheezing, no use of accessory muscles.  Cardiac: RRR, precordium not hyperdynamic. Vascular:  scattered varicosities present bilaterally.  Mild venous stasis changes  to the legs bilaterally.  3-4+ soft pitting edema, CEAP [id number]  Vessel Right Left  Radial Palpable Palpable  Gastrointestinal: soft, non-distended. No guarding/no peritoneal signs.  Musculoskeletal: M/S 5/5 throughout.  No deformity.  Neurologic: CN 2-12 intact. Pain and light touch intact in extremities.  Symmetrical.  Speech is fluent. Motor exam as listed above. Psychiatric: Judgment intact, Mood & affect appropriate for pt's clinical situation. Dermatologic: Venous rashes no ulcers noted.  No changes consistent with cellulitis. Lymph : No lichenification or skin changes of chronic lymphedema.  CBC Lab Results  Component Value Date   WBC 5.0 10/11/2022   HGB 14.6 10/11/2022   HCT 42.2 10/11/2022   MCV 93.8 10/11/2022   PLT 216 10/11/2022    BMET    Component Value Date/Time   NA 138 05/09/2022 1145   NA 138 09/11/2014 2309   K 3.6 05/09/2022 1145   K 3.9 09/11/2014 2309   CL 105 05/09/2022 1145   CL 104 09/11/2014 2309   CO2 27 05/09/2022 1145   CO2 26 09/11/2014 2309   GLUCOSE 56 (L) 05/09/2022 1145   GLUCOSE 96 09/11/2014 2309   BUN 5 (L) 05/09/2022 1145   BUN 2 (L) 09/11/2014 2309    CREATININE 0.64 05/09/2022 1145   CREATININE 0.84 09/11/2014 2309   CALCIUM 8.9 05/09/2022 1145   CALCIUM 8.5 09/11/2014 2309   GFRNONAA >60 05/09/2022 1145   GFRNONAA >60 09/11/2014 2309   GFRNONAA >60 07/13/2014 2344   GFRAA >60 12/06/2019 0640   GFRAA >60 09/11/2014 2309   GFRAA >60 07/13/2014 2344   CrCl cannot be calculated (Patient's most recent lab result is older than the maximum 21 days allowed.).  COAG Lab Results  Component Value Date   INR 1.0 04/27/2021   INR 1.44 01/01/2016   INR 0.98 11/07/2013    Radiology No results found.   Assessment/Plan There are no diagnoses linked to this encounter.   Levora Dredge, MD  06/25/2023 3:22 PM

## 2023-06-26 ENCOUNTER — Encounter (INDEPENDENT_AMBULATORY_CARE_PROVIDER_SITE_OTHER): Payer: Medicare HMO | Admitting: Vascular Surgery

## 2023-06-26 DIAGNOSIS — I5032 Chronic diastolic (congestive) heart failure: Secondary | ICD-10-CM

## 2023-06-26 DIAGNOSIS — K219 Gastro-esophageal reflux disease without esophagitis: Secondary | ICD-10-CM

## 2023-06-26 DIAGNOSIS — I890 Lymphedema, not elsewhere classified: Secondary | ICD-10-CM

## 2023-06-26 DIAGNOSIS — J449 Chronic obstructive pulmonary disease, unspecified: Secondary | ICD-10-CM

## 2023-07-06 DIAGNOSIS — M25561 Pain in right knee: Secondary | ICD-10-CM | POA: Diagnosis not present

## 2023-07-06 DIAGNOSIS — F1120 Opioid dependence, uncomplicated: Secondary | ICD-10-CM | POA: Diagnosis not present

## 2023-07-06 DIAGNOSIS — G8929 Other chronic pain: Secondary | ICD-10-CM | POA: Diagnosis not present

## 2023-07-06 DIAGNOSIS — F39 Unspecified mood [affective] disorder: Secondary | ICD-10-CM | POA: Diagnosis not present

## 2023-07-17 DIAGNOSIS — R5383 Other fatigue: Secondary | ICD-10-CM | POA: Diagnosis not present

## 2023-07-17 DIAGNOSIS — I10 Essential (primary) hypertension: Secondary | ICD-10-CM | POA: Diagnosis not present

## 2023-07-17 DIAGNOSIS — Z131 Encounter for screening for diabetes mellitus: Secondary | ICD-10-CM | POA: Diagnosis not present

## 2023-07-17 DIAGNOSIS — E785 Hyperlipidemia, unspecified: Secondary | ICD-10-CM | POA: Diagnosis not present

## 2023-07-17 DIAGNOSIS — E559 Vitamin D deficiency, unspecified: Secondary | ICD-10-CM | POA: Diagnosis not present

## 2023-07-17 DIAGNOSIS — R252 Cramp and spasm: Secondary | ICD-10-CM | POA: Diagnosis not present

## 2023-07-17 DIAGNOSIS — R5381 Other malaise: Secondary | ICD-10-CM | POA: Diagnosis not present

## 2023-07-17 DIAGNOSIS — Z1331 Encounter for screening for depression: Secondary | ICD-10-CM | POA: Diagnosis not present

## 2023-07-17 DIAGNOSIS — Z23 Encounter for immunization: Secondary | ICD-10-CM | POA: Diagnosis not present

## 2023-07-17 DIAGNOSIS — W19XXXA Unspecified fall, initial encounter: Secondary | ICD-10-CM | POA: Diagnosis not present

## 2023-07-17 DIAGNOSIS — Z1389 Encounter for screening for other disorder: Secondary | ICD-10-CM | POA: Diagnosis not present

## 2023-07-17 DIAGNOSIS — R799 Abnormal finding of blood chemistry, unspecified: Secondary | ICD-10-CM | POA: Diagnosis not present

## 2023-07-17 DIAGNOSIS — Z0001 Encounter for general adult medical examination with abnormal findings: Secondary | ICD-10-CM | POA: Diagnosis not present

## 2023-07-17 DIAGNOSIS — R011 Cardiac murmur, unspecified: Secondary | ICD-10-CM | POA: Diagnosis not present

## 2023-07-17 DIAGNOSIS — K219 Gastro-esophageal reflux disease without esophagitis: Secondary | ICD-10-CM | POA: Diagnosis not present

## 2023-08-03 DIAGNOSIS — F1120 Opioid dependence, uncomplicated: Secondary | ICD-10-CM | POA: Diagnosis not present

## 2023-08-03 DIAGNOSIS — M25561 Pain in right knee: Secondary | ICD-10-CM | POA: Diagnosis not present

## 2023-08-03 DIAGNOSIS — G8929 Other chronic pain: Secondary | ICD-10-CM | POA: Diagnosis not present

## 2023-08-03 DIAGNOSIS — F39 Unspecified mood [affective] disorder: Secondary | ICD-10-CM | POA: Diagnosis not present

## 2023-08-16 DIAGNOSIS — R002 Palpitations: Secondary | ICD-10-CM | POA: Diagnosis not present

## 2023-08-16 DIAGNOSIS — I509 Heart failure, unspecified: Secondary | ICD-10-CM | POA: Diagnosis not present

## 2023-08-16 DIAGNOSIS — I739 Peripheral vascular disease, unspecified: Secondary | ICD-10-CM | POA: Diagnosis not present

## 2023-08-16 DIAGNOSIS — R609 Edema, unspecified: Secondary | ICD-10-CM | POA: Diagnosis not present

## 2023-08-16 DIAGNOSIS — F17210 Nicotine dependence, cigarettes, uncomplicated: Secondary | ICD-10-CM | POA: Diagnosis not present

## 2023-08-16 DIAGNOSIS — K219 Gastro-esophageal reflux disease without esophagitis: Secondary | ICD-10-CM | POA: Diagnosis not present

## 2023-08-16 DIAGNOSIS — R000 Tachycardia, unspecified: Secondary | ICD-10-CM | POA: Diagnosis not present

## 2023-08-16 DIAGNOSIS — I10 Essential (primary) hypertension: Secondary | ICD-10-CM | POA: Diagnosis not present

## 2023-08-16 DIAGNOSIS — R011 Cardiac murmur, unspecified: Secondary | ICD-10-CM | POA: Diagnosis not present

## 2023-08-31 DIAGNOSIS — F1120 Opioid dependence, uncomplicated: Secondary | ICD-10-CM | POA: Diagnosis not present

## 2023-08-31 DIAGNOSIS — G8929 Other chronic pain: Secondary | ICD-10-CM | POA: Diagnosis not present

## 2023-08-31 DIAGNOSIS — M25561 Pain in right knee: Secondary | ICD-10-CM | POA: Diagnosis not present

## 2023-08-31 DIAGNOSIS — F1320 Sedative, hypnotic or anxiolytic dependence, uncomplicated: Secondary | ICD-10-CM | POA: Diagnosis not present

## 2023-08-31 DIAGNOSIS — M790 Rheumatism, unspecified: Secondary | ICD-10-CM | POA: Diagnosis not present

## 2023-08-31 DIAGNOSIS — F39 Unspecified mood [affective] disorder: Secondary | ICD-10-CM | POA: Diagnosis not present

## 2023-09-06 DIAGNOSIS — F1120 Opioid dependence, uncomplicated: Secondary | ICD-10-CM | POA: Diagnosis not present

## 2023-09-28 DIAGNOSIS — G8929 Other chronic pain: Secondary | ICD-10-CM | POA: Diagnosis not present

## 2023-09-28 DIAGNOSIS — F1120 Opioid dependence, uncomplicated: Secondary | ICD-10-CM | POA: Diagnosis not present

## 2023-09-28 DIAGNOSIS — M25561 Pain in right knee: Secondary | ICD-10-CM | POA: Diagnosis not present

## 2023-09-28 DIAGNOSIS — F39 Unspecified mood [affective] disorder: Secondary | ICD-10-CM | POA: Diagnosis not present

## 2023-10-02 DIAGNOSIS — G8929 Other chronic pain: Secondary | ICD-10-CM | POA: Diagnosis not present

## 2023-10-02 DIAGNOSIS — F39 Unspecified mood [affective] disorder: Secondary | ICD-10-CM | POA: Diagnosis not present

## 2023-10-02 DIAGNOSIS — M25561 Pain in right knee: Secondary | ICD-10-CM | POA: Diagnosis not present

## 2023-10-02 DIAGNOSIS — F1120 Opioid dependence, uncomplicated: Secondary | ICD-10-CM | POA: Diagnosis not present

## 2024-03-28 IMAGING — DX DG KNEE COMPLETE 4+V*R*
4 series · 4 of 4 positions shown · non-contrast
Comparison: Right knee x-rays dated July 29, 2020, and December 06, 2018.

CLINICAL DATA: Acute on chronic right knee pain after fall
yesterday.

EXAM:
RIGHT KNEE - COMPLETE 4+ VIEW

[knee ap]
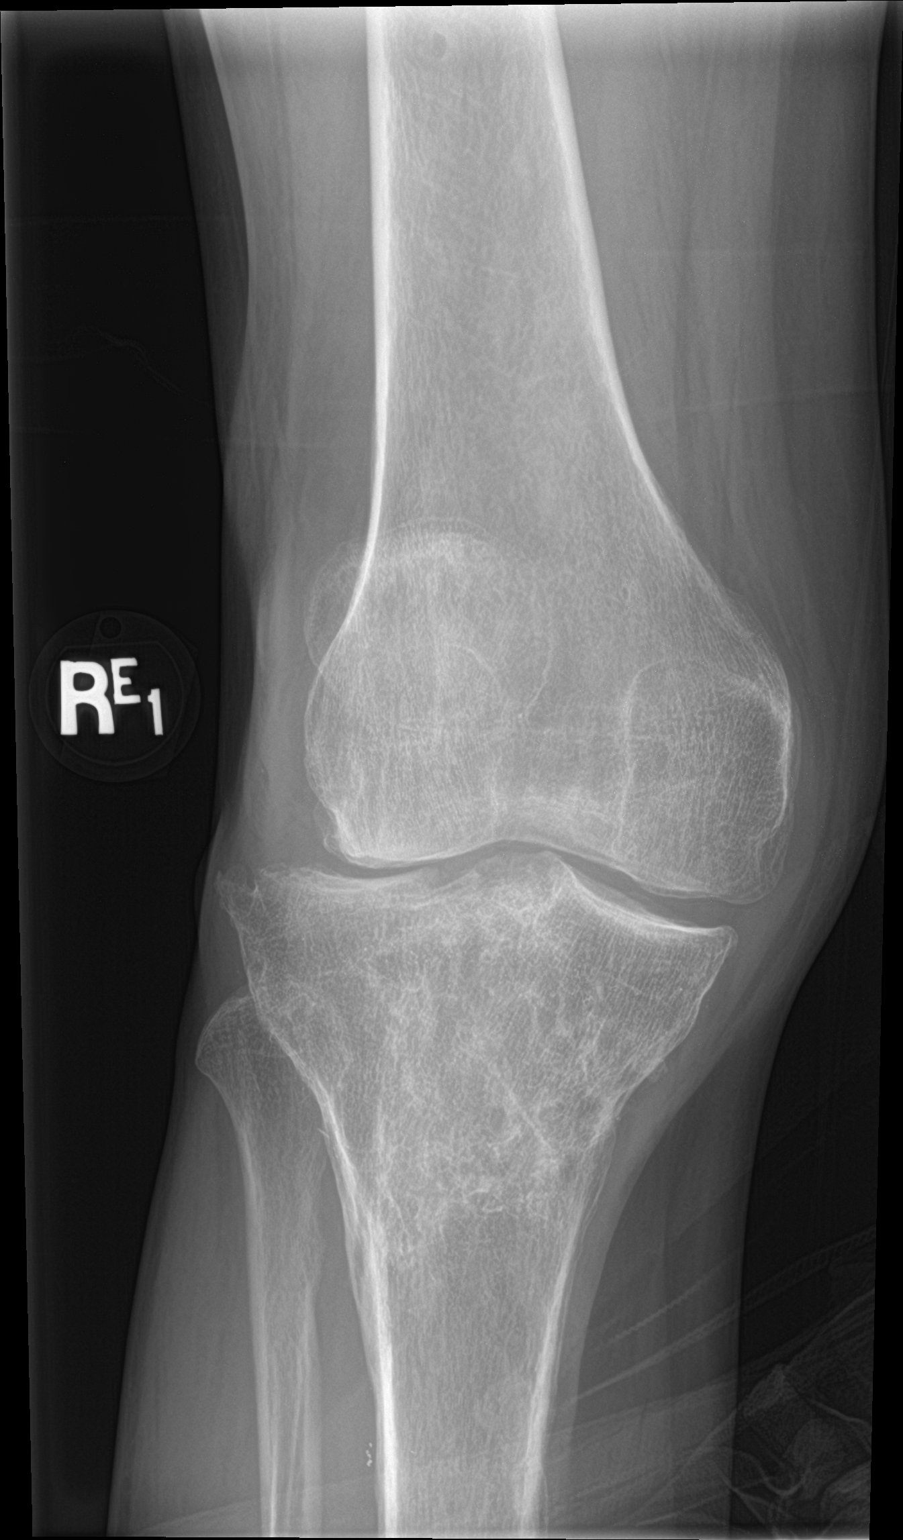

[knee obl (1 of 2)]
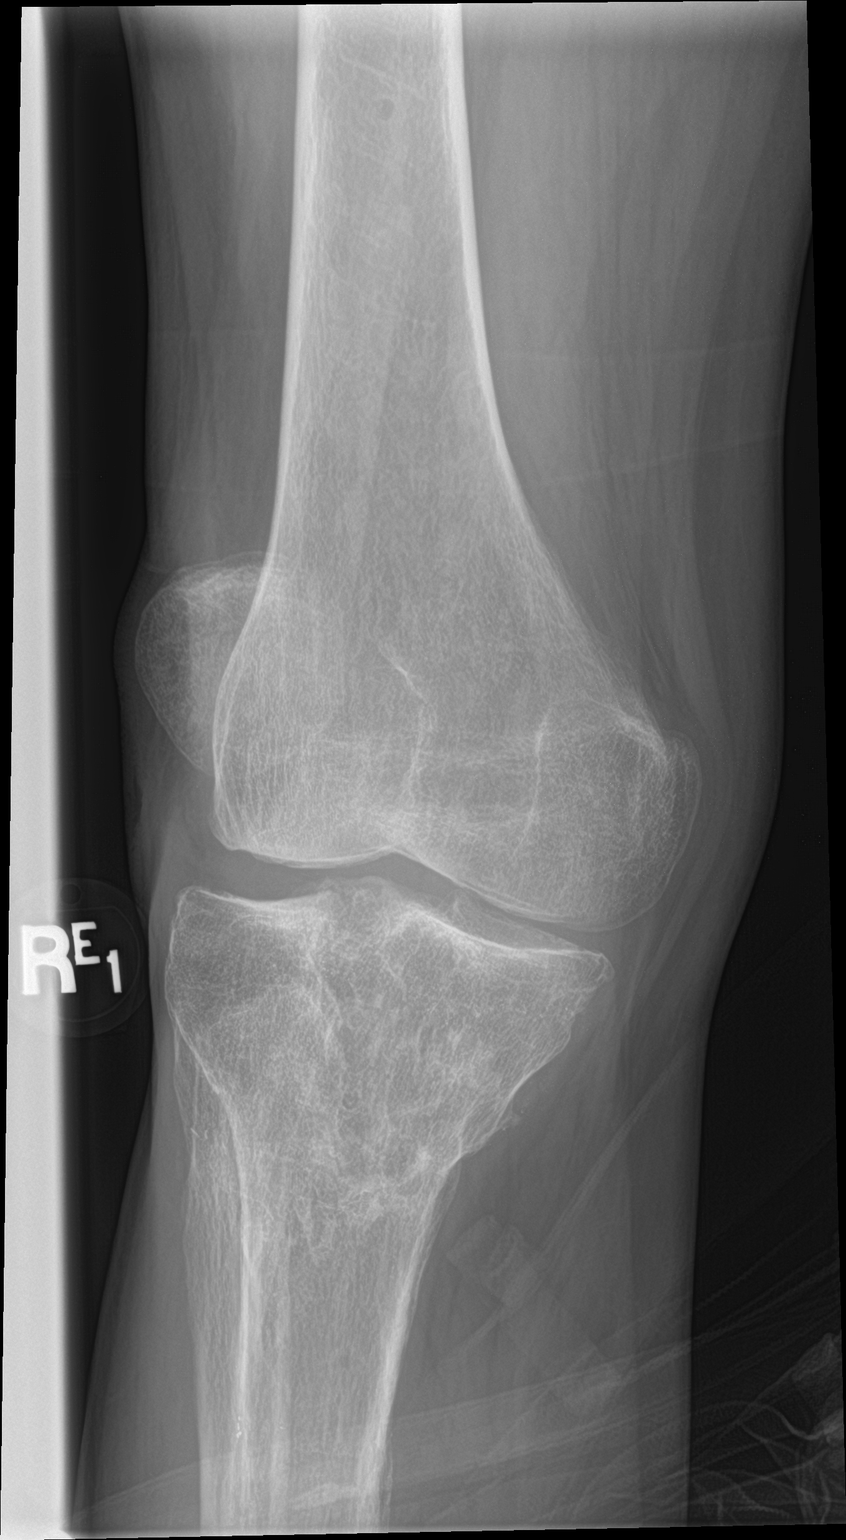

[knee obl (2 of 2)]
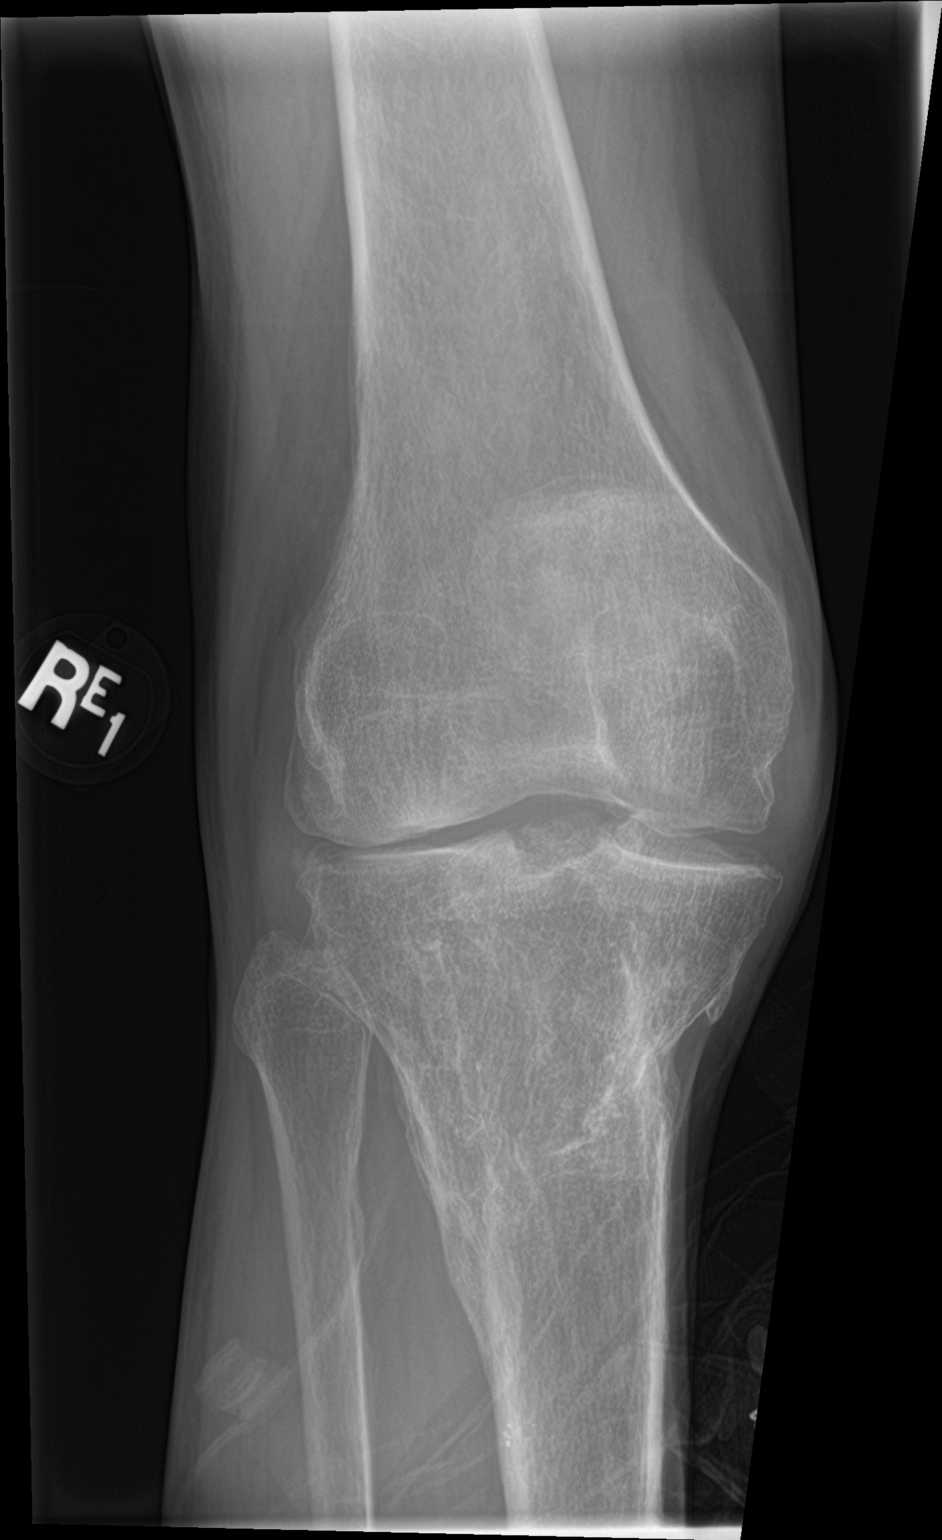

[knee lat]
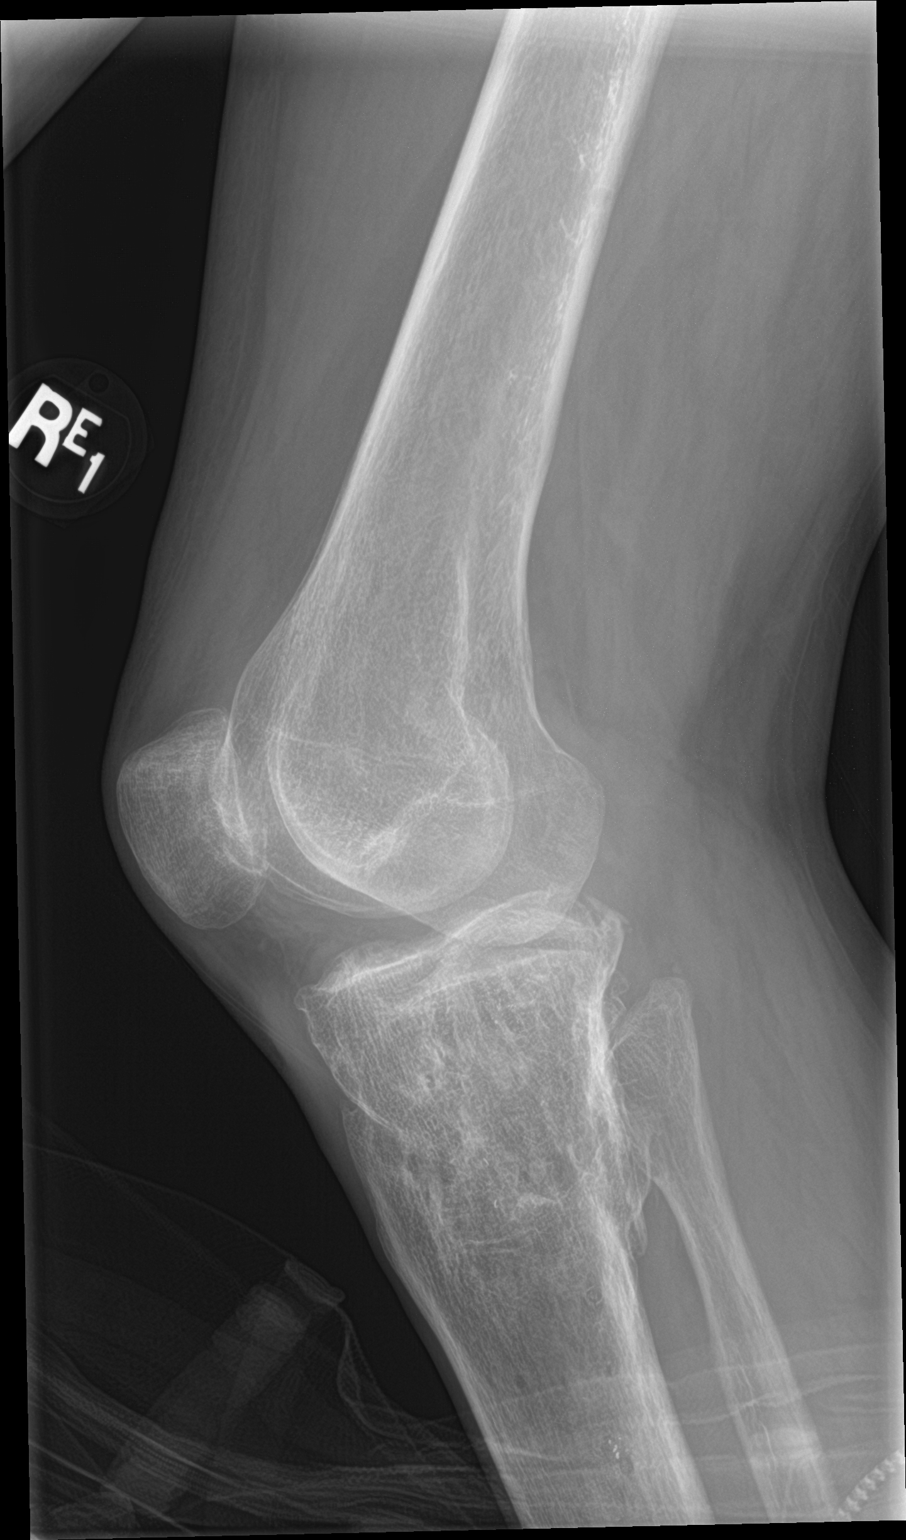

[4 of 4 positions shown; findings below may reference images not displayed]

FINDINGS: No acute fracture or dislocation. Old healed fracture deformity of
the proximal tibia. Small joint effusion. Mild medial and moderate
lateral compartment joint space narrowing, progressed since 8585.
Mildly worsened lateral subluxation of the proximal tibia with
respect to the distal femur. Soft tissues are unremarkable.
IMPRESSION: 1. No acute osseous abnormality. Old healed fracture of the proximal
tibia.
2. Mild medial and moderate lateral compartment osteoarthritis,
progressed since [DATE]. Small joint effusion.

## 2024-04-12 IMAGING — DX DG KNEE COMPLETE 4+V*R*
4 series · 4 of 4 positions shown · non-contrast
Comparison: 02/21/2022

CLINICAL DATA: Right knee pain and instability.

EXAM:
RIGHT KNEE - COMPLETE 4+ VIEW

[knee ap]
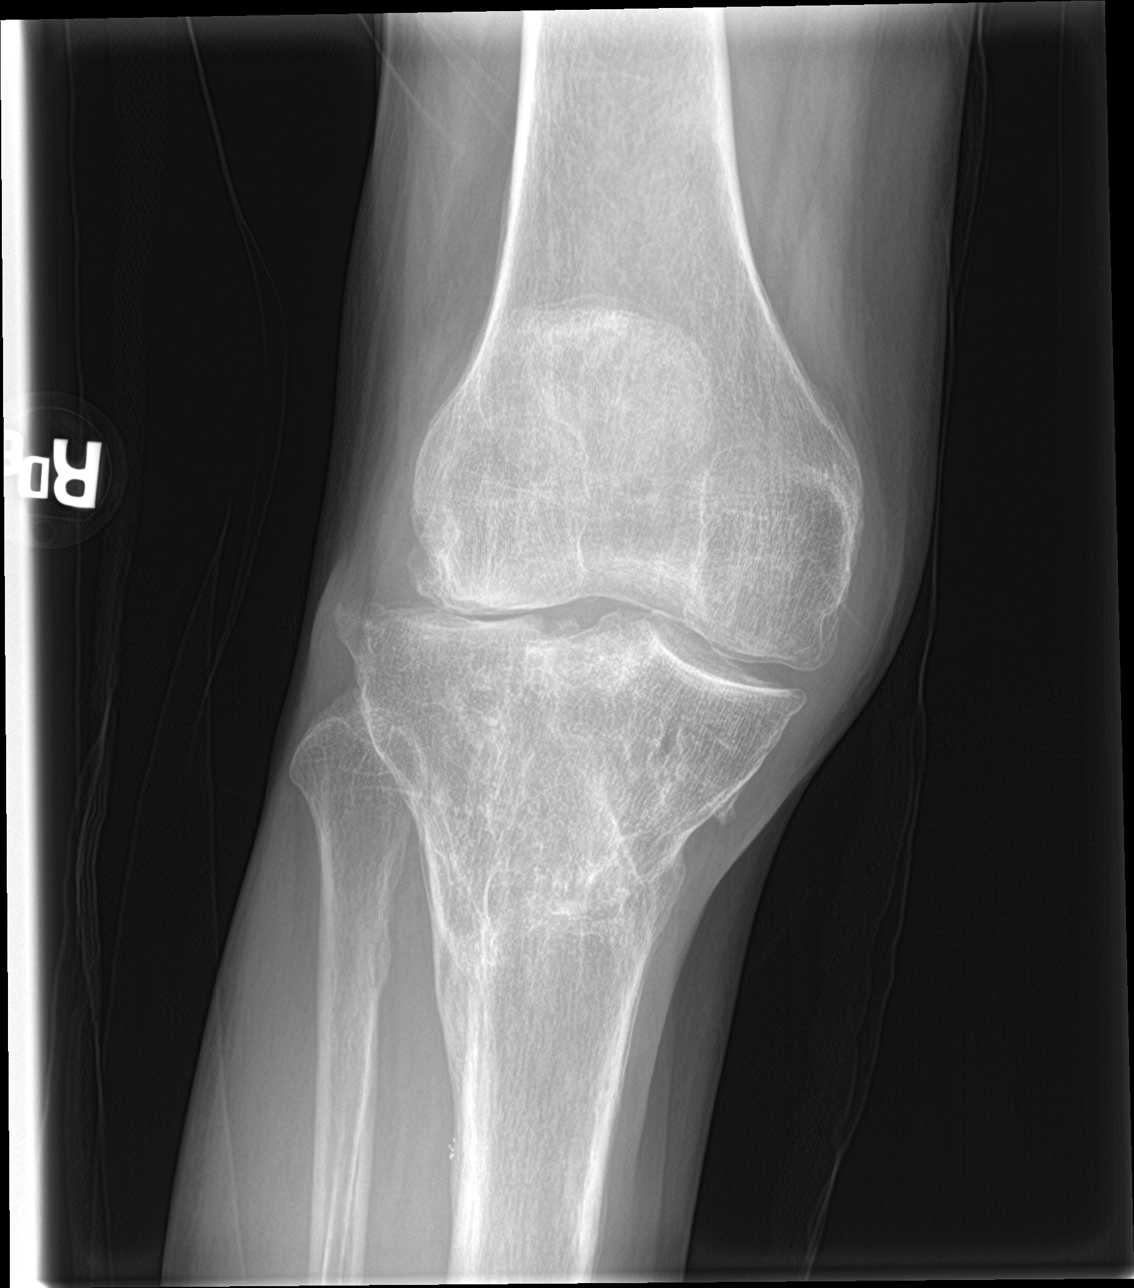

[knee obl (1 of 2)]
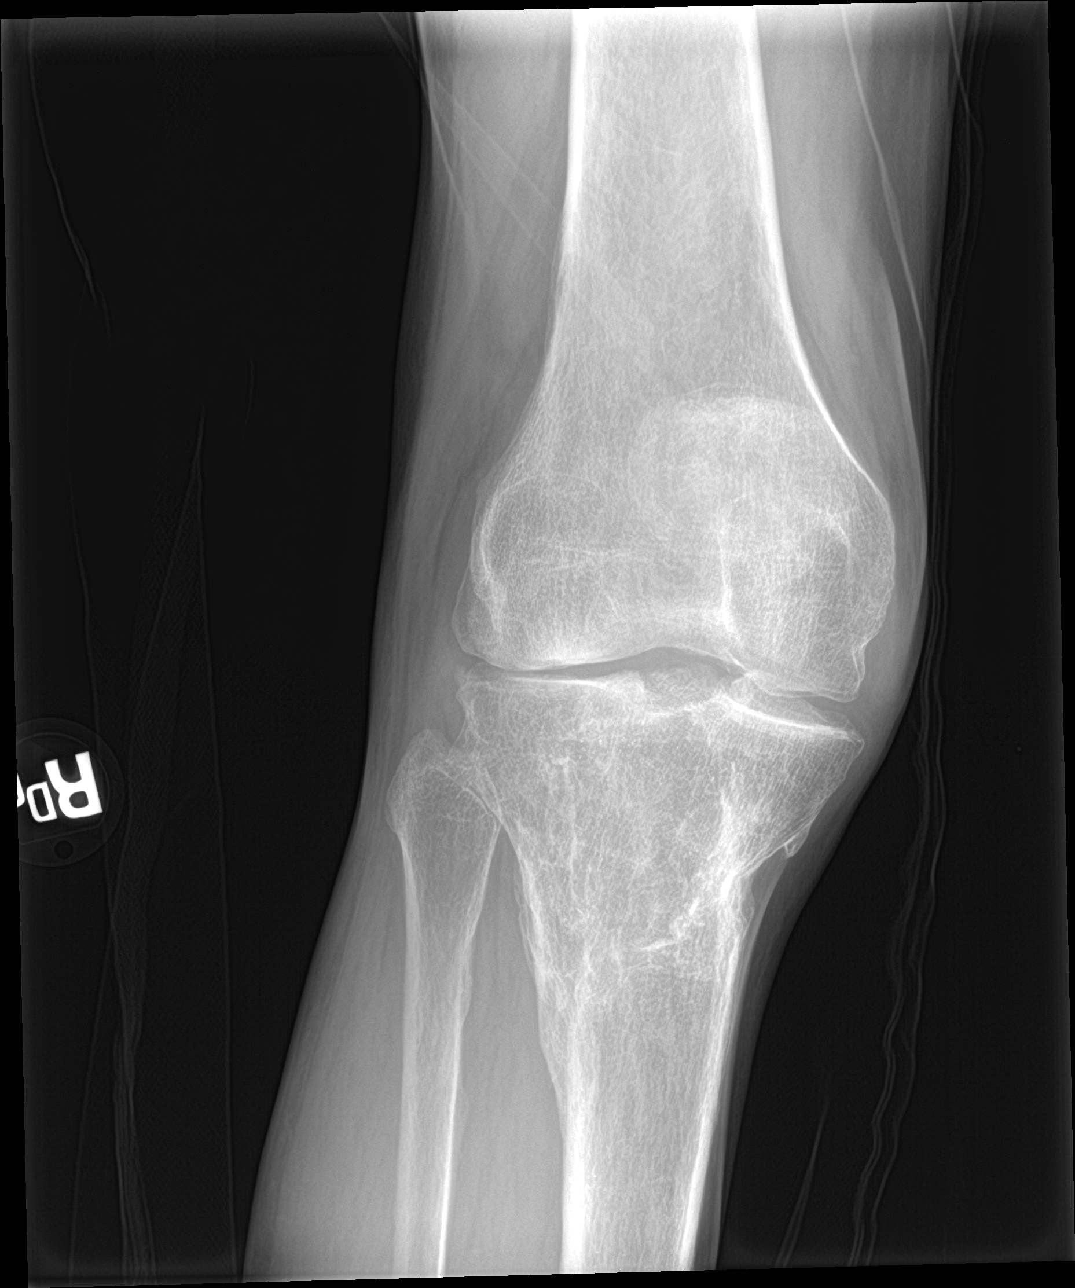

[knee obl (2 of 2)]
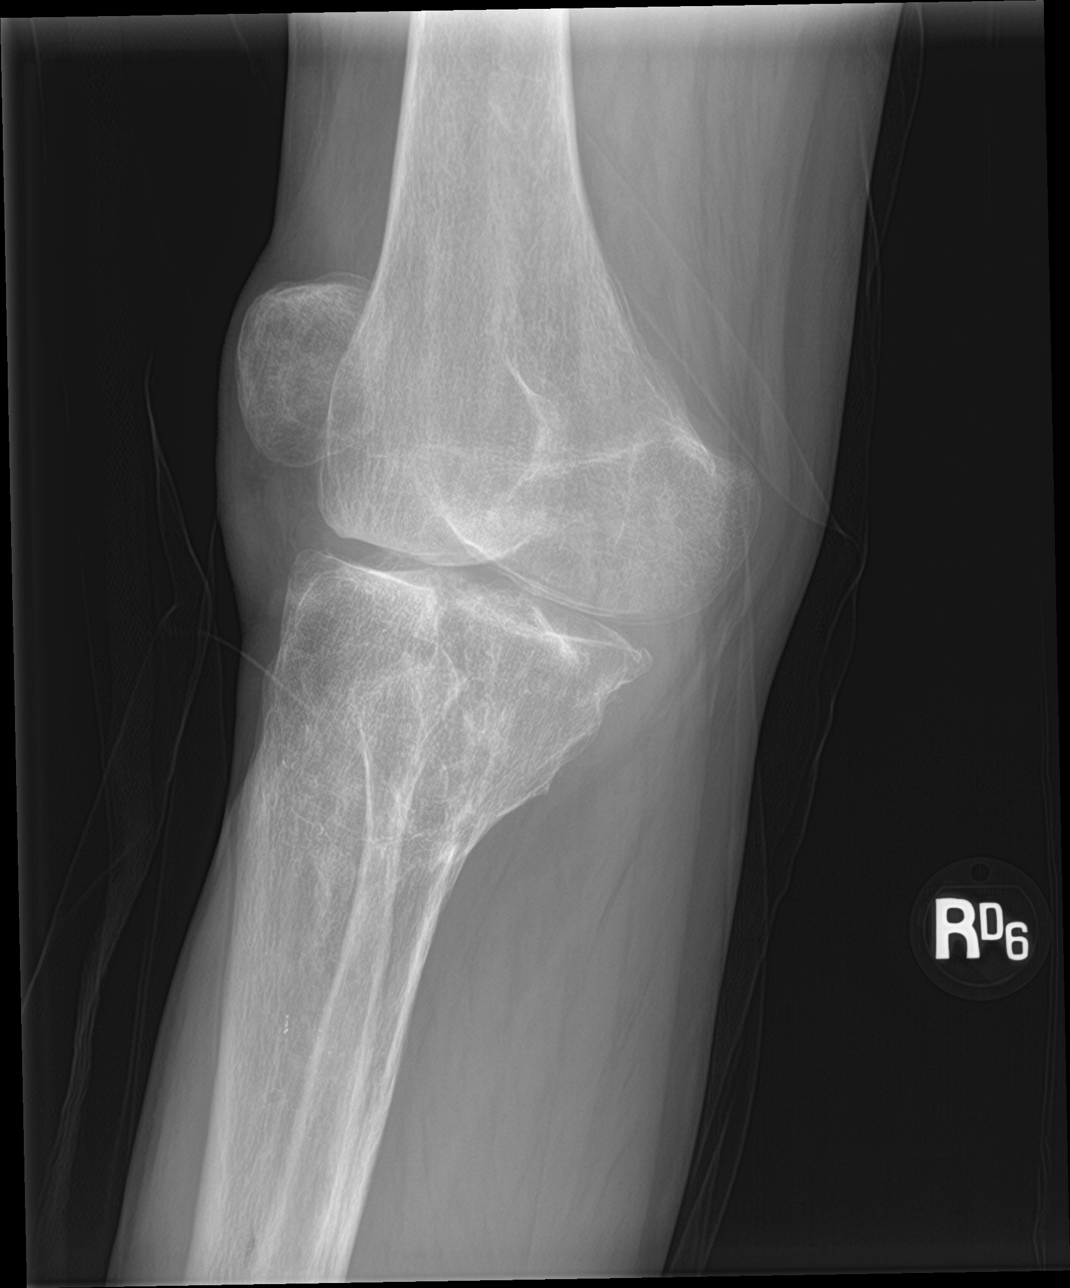

[knee lat]
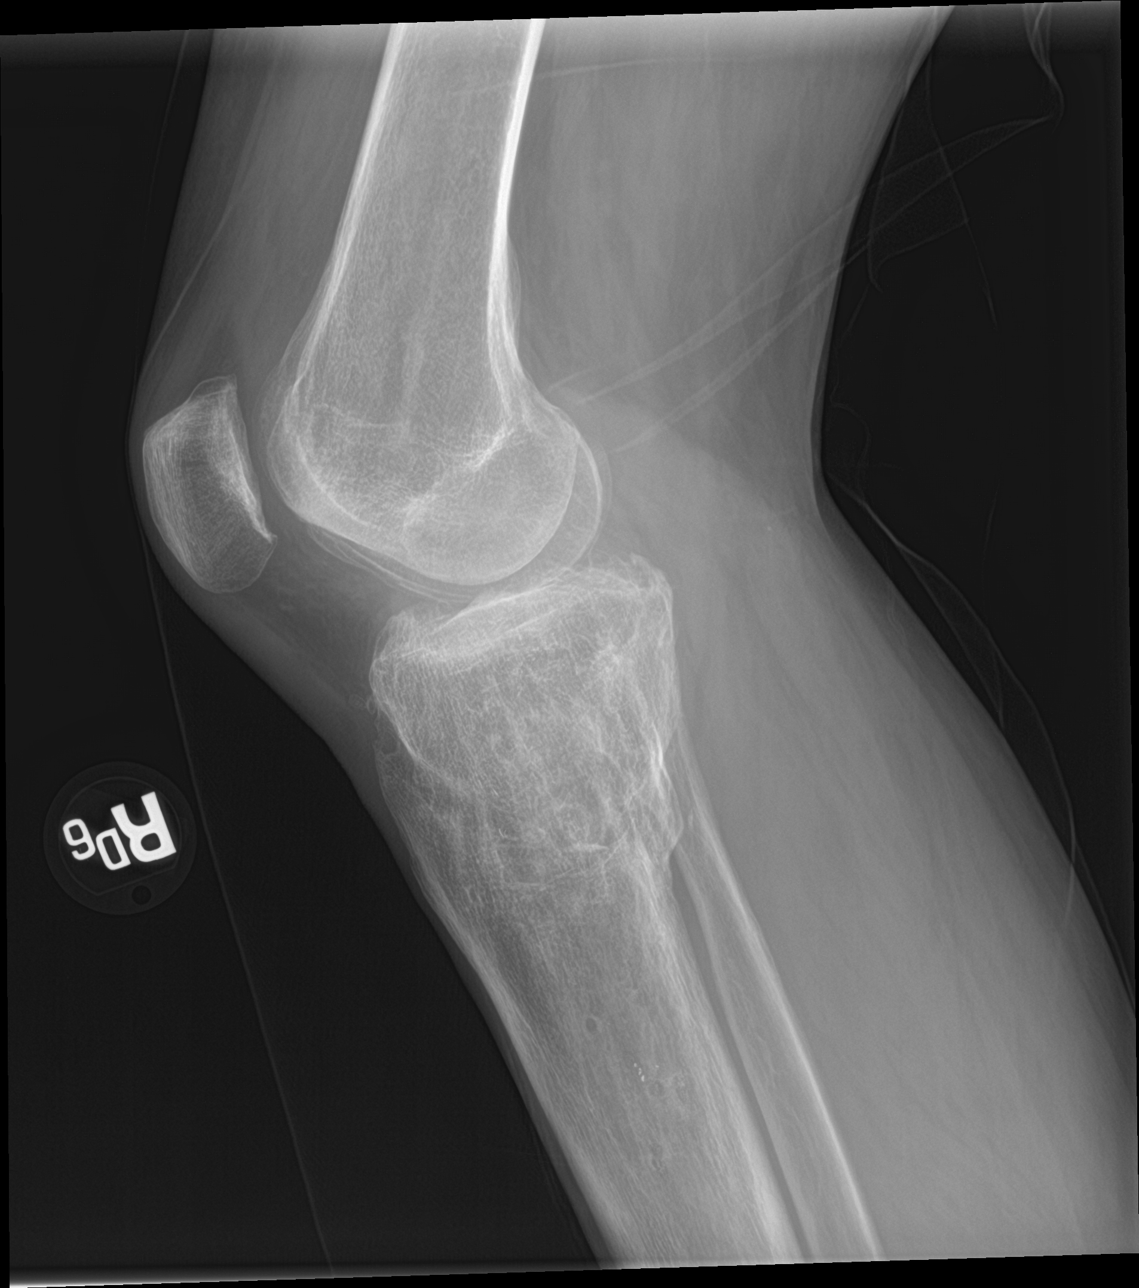

[4 of 4 positions shown; findings below may reference images not displayed]

FINDINGS: No evidence of acute fracture or dislocation. Probable small knee
joint effusion noted. Old fracture deformity of the proximal tibia
is again seen. Mild medial and lateral compartment osteoarthritis is
noted. Generalized osteopenia is seen, but no other focal bone
lesions identified.
IMPRESSION: Probable small knee joint effusion.  No other acute findings.

Old fracture deformity of the proximal tibia.

Mild medial and lateral compartment osteoarthritis.

Osteopenia.

## 2024-04-12 IMAGING — DX DG CHEST 2V
2 series · 2 of 2 positions shown · non-contrast
Comparison: 06/03/2021

CLINICAL DATA: COPD.

EXAM:
CHEST - 2 VIEW

[chest lat]
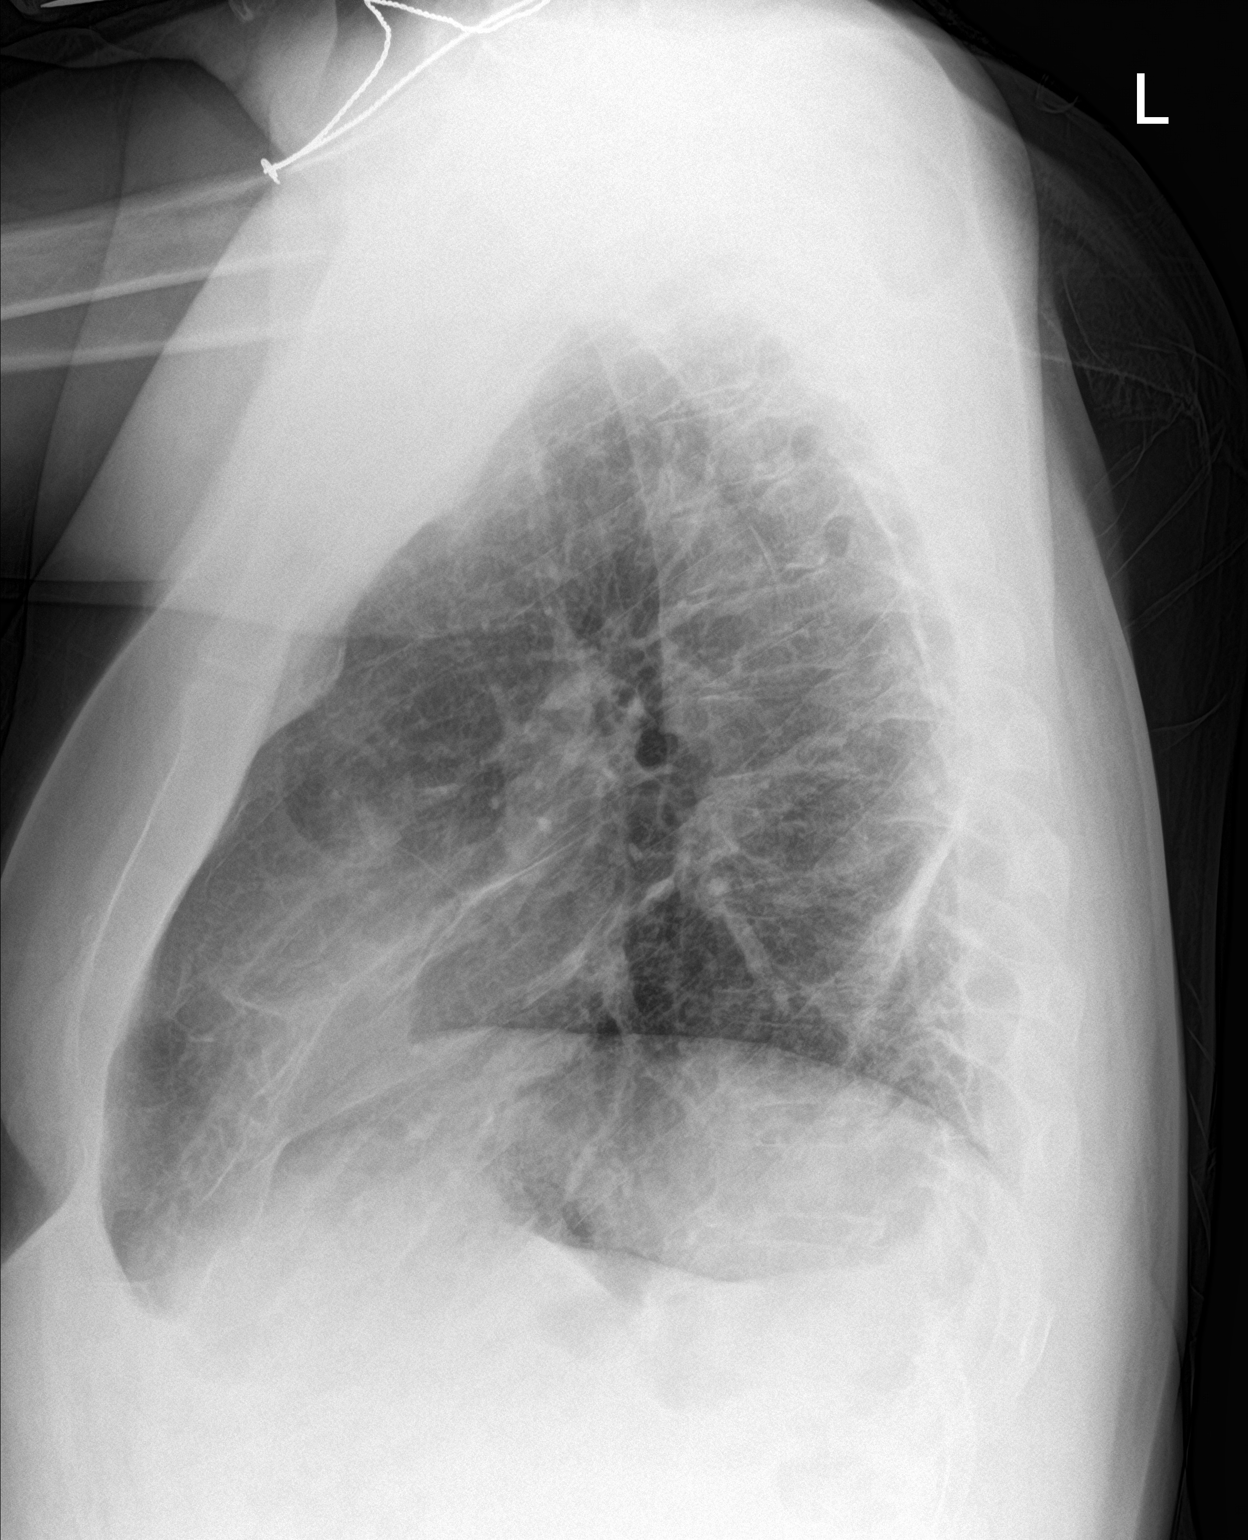

[chest ap]
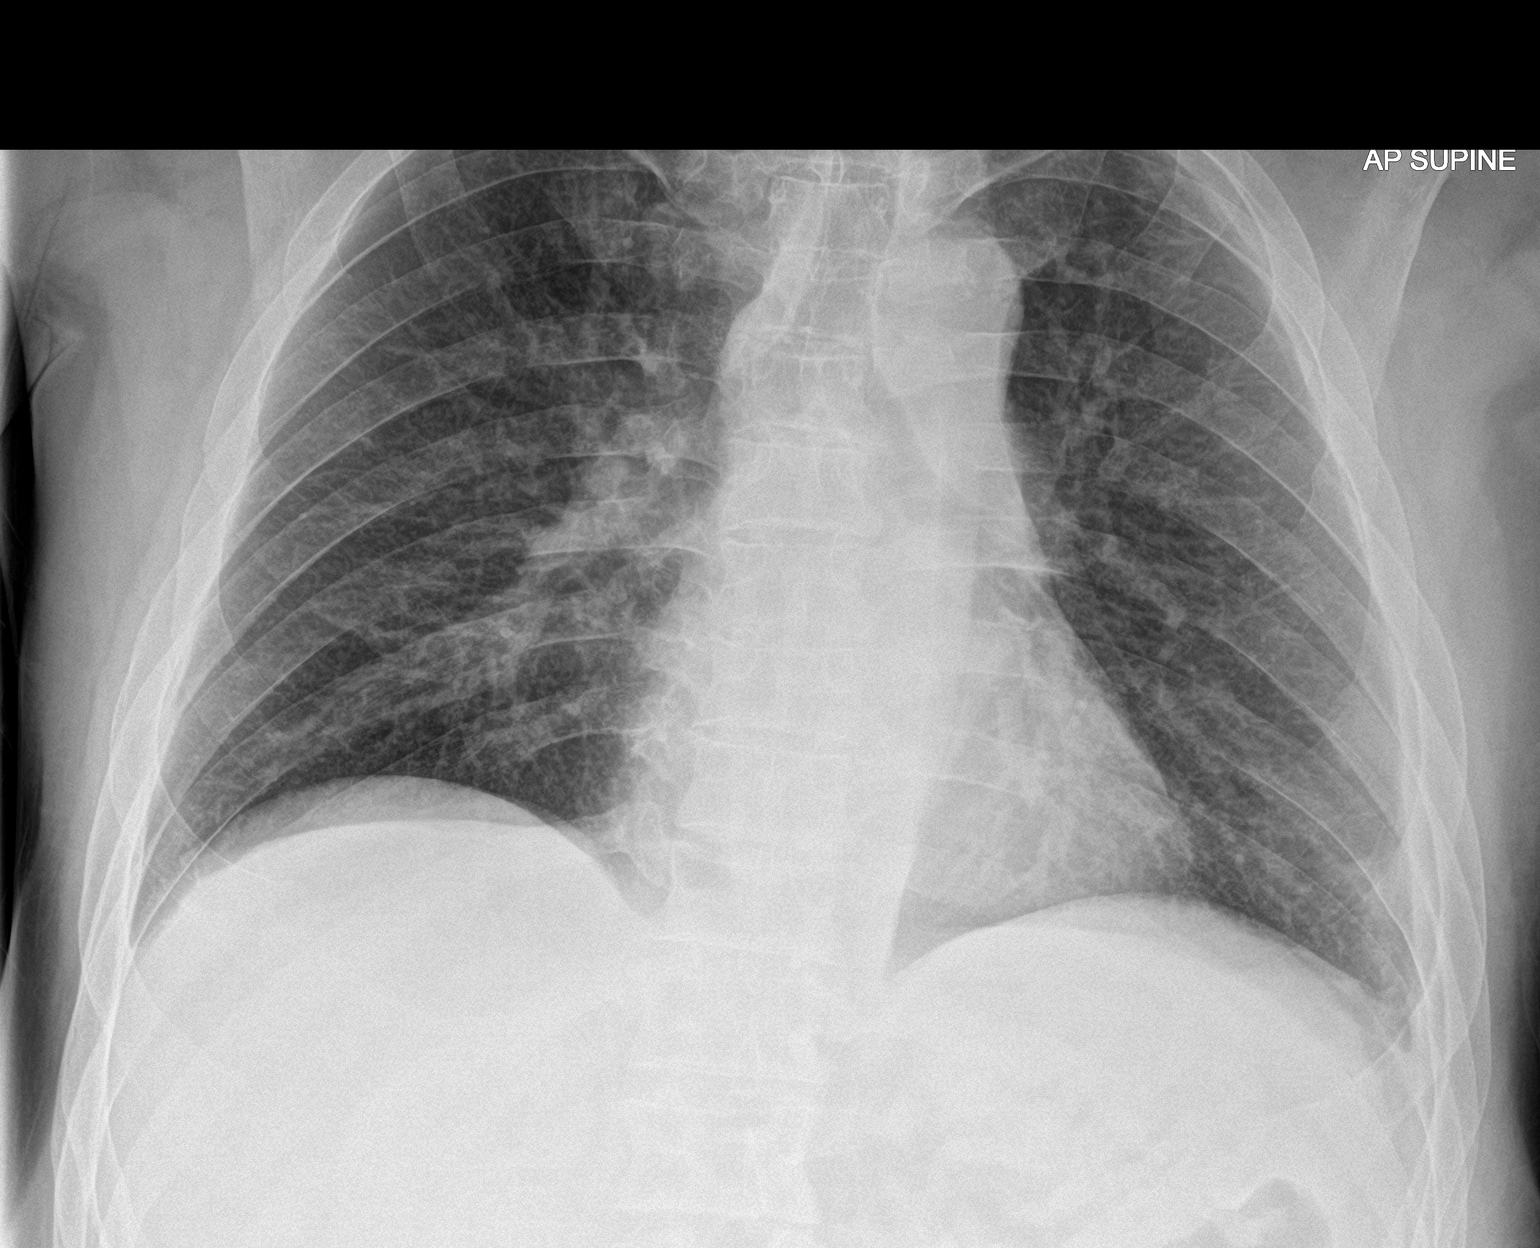

[2 of 2 positions shown; findings below may reference images not displayed]

FINDINGS: The heart size and mediastinal contours are within normal limits.
Pleural-parenchymal scarring again seen in the lingula and posterior
left lower lobe. No evidence of pulmonary infiltrate or pleural
effusion.
IMPRESSION: Stable mild left-sided pleural-parenchymal scarring. No active
cardiopulmonary disease.

## 2024-05-06 ENCOUNTER — Inpatient Hospital Stay
Admission: EM | Admit: 2024-05-06 | Discharge: 2024-05-08 | DRG: 193 | Disposition: A | Discharge status: Home / Self Care | Attending: Internal Medicine | Admitting: Internal Medicine

## 2024-05-06 ENCOUNTER — Emergency Department

## 2024-05-06 DIAGNOSIS — J153 Pneumonia due to streptococcus, group B: Secondary | ICD-10-CM | POA: Diagnosis not present

## 2024-05-06 DIAGNOSIS — K219 Gastro-esophageal reflux disease without esophagitis: Secondary | ICD-10-CM | POA: Diagnosis present

## 2024-05-06 DIAGNOSIS — F332 Major depressive disorder, recurrent severe without psychotic features: Secondary | ICD-10-CM | POA: Diagnosis present

## 2024-05-06 DIAGNOSIS — Z888 Allergy status to other drugs, medicaments and biological substances status: Secondary | ICD-10-CM

## 2024-05-06 DIAGNOSIS — J449 Chronic obstructive pulmonary disease, unspecified: Secondary | ICD-10-CM | POA: Diagnosis present

## 2024-05-06 DIAGNOSIS — G40909 Epilepsy, unspecified, not intractable, without status epilepticus: Secondary | ICD-10-CM | POA: Diagnosis present

## 2024-05-06 DIAGNOSIS — Z8249 Family history of ischemic heart disease and other diseases of the circulatory system: Secondary | ICD-10-CM

## 2024-05-06 DIAGNOSIS — Z833 Family history of diabetes mellitus: Secondary | ICD-10-CM | POA: Diagnosis not present

## 2024-05-06 DIAGNOSIS — J440 Chronic obstructive pulmonary disease with acute lower respiratory infection: Secondary | ICD-10-CM | POA: Diagnosis present

## 2024-05-06 DIAGNOSIS — F17200 Nicotine dependence, unspecified, uncomplicated: Secondary | ICD-10-CM | POA: Diagnosis present

## 2024-05-06 DIAGNOSIS — E876 Hypokalemia: Secondary | ICD-10-CM | POA: Diagnosis present

## 2024-05-06 DIAGNOSIS — J189 Pneumonia, unspecified organism: Secondary | ICD-10-CM | POA: Diagnosis not present

## 2024-05-06 DIAGNOSIS — Z885 Allergy status to narcotic agent status: Secondary | ICD-10-CM

## 2024-05-06 DIAGNOSIS — J9601 Acute respiratory failure with hypoxia: Principal | ICD-10-CM | POA: Diagnosis present

## 2024-05-06 DIAGNOSIS — I110 Hypertensive heart disease with heart failure: Secondary | ICD-10-CM | POA: Diagnosis present

## 2024-05-06 DIAGNOSIS — Z1152 Encounter for screening for COVID-19: Secondary | ICD-10-CM | POA: Diagnosis not present

## 2024-05-06 DIAGNOSIS — Z7951 Long term (current) use of inhaled steroids: Secondary | ICD-10-CM | POA: Diagnosis not present

## 2024-05-06 DIAGNOSIS — Z79899 Other long term (current) drug therapy: Secondary | ICD-10-CM | POA: Diagnosis not present

## 2024-05-06 DIAGNOSIS — Z880 Allergy status to penicillin: Secondary | ICD-10-CM

## 2024-05-06 DIAGNOSIS — F419 Anxiety disorder, unspecified: Secondary | ICD-10-CM | POA: Diagnosis present

## 2024-05-06 DIAGNOSIS — E871 Hypo-osmolality and hyponatremia: Secondary | ICD-10-CM | POA: Diagnosis present

## 2024-05-06 LAB — CBG MONITORING, ED
Glucose-Capillary: 117 mg/dL — ABNORMAL HIGH (ref 70–99)
Glucose-Capillary: 119 mg/dL — ABNORMAL HIGH (ref 70–99)
Glucose-Capillary: 61 mg/dL — ABNORMAL LOW (ref 70–99)

## 2024-05-06 LAB — TROPONIN I (HIGH SENSITIVITY)
Troponin I (High Sensitivity): 15 ng/L (ref <18)
Troponin I (High Sensitivity): 26 ng/L — ABNORMAL HIGH (ref <18)

## 2024-05-06 LAB — STREP PNEUMONIAE URINARY ANTIGEN: Strep Pneumo Urinary Antigen: NEGATIVE

## 2024-05-06 LAB — CBC WITH DIFFERENTIAL/PLATELET
Abs Immature Granulocytes: 0.01 10*3/uL (ref 0.00–0.07)
Basophils Absolute: 0 10*3/uL (ref 0.0–0.1)
Basophils Relative: 0 %
Eosinophils Absolute: 0 10*3/uL (ref 0.0–0.5)
Eosinophils Relative: 0 %
HCT: 40.1 % (ref 39.0–52.0)
Hemoglobin: 13.4 g/dL (ref 13.0–17.0)
Immature Granulocytes: 0 %
Lymphocytes Relative: 6 %
Lymphs Abs: 0.3 10*3/uL — ABNORMAL LOW (ref 0.7–4.0)
MCH: 30 pg (ref 26.0–34.0)
MCHC: 33.4 g/dL (ref 30.0–36.0)
MCV: 89.9 fL (ref 80.0–100.0)
Monocytes Absolute: 0.4 10*3/uL (ref 0.1–1.0)
Monocytes Relative: 9 %
Neutro Abs: 4.4 10*3/uL (ref 1.7–7.7)
Neutrophils Relative %: 85 %
Platelets: 145 10*3/uL — ABNORMAL LOW (ref 150–400)
RBC: 4.46 MIL/uL (ref 4.22–5.81)
RDW: 12.6 % (ref 11.5–15.5)
WBC: 5.1 10*3/uL (ref 4.0–10.5)
nRBC: 0 % (ref 0.0–0.2)

## 2024-05-06 LAB — URINALYSIS, ROUTINE W REFLEX MICROSCOPIC
Bilirubin Urine: NEGATIVE
Glucose, UA: NEGATIVE mg/dL
Hgb urine dipstick: NEGATIVE
Ketones, ur: NEGATIVE mg/dL
Leukocytes,Ua: NEGATIVE
Nitrite: NEGATIVE
Protein, ur: NEGATIVE mg/dL
Specific Gravity, Urine: 1.01 (ref 1.005–1.030)
pH: 6 (ref 5.0–8.0)

## 2024-05-06 LAB — COMPREHENSIVE METABOLIC PANEL WITH GFR
ALT: 14 U/L (ref 0–44)
AST: 26 U/L (ref 15–41)
Albumin: 3.6 g/dL (ref 3.5–5.0)
Alkaline Phosphatase: 55 U/L (ref 38–126)
Anion gap: 9 (ref 5–15)
BUN: 9 mg/dL (ref 6–20)
CO2: 26 mmol/L (ref 22–32)
Calcium: 8.2 mg/dL — ABNORMAL LOW (ref 8.9–10.3)
Chloride: 98 mmol/L (ref 98–111)
Creatinine, Ser: 0.73 mg/dL (ref 0.61–1.24)
GFR, Estimated: >60 mL/min (ref >60)
Glucose, Bld: 113 mg/dL — ABNORMAL HIGH (ref 70–99)
Potassium: 3.3 mmol/L — ABNORMAL LOW (ref 3.5–5.1)
Sodium: 133 mmol/L — ABNORMAL LOW (ref 135–145)
Total Bilirubin: 0.7 mg/dL (ref 0.0–1.2)
Total Protein: 6.4 g/dL — ABNORMAL LOW (ref 6.5–8.1)

## 2024-05-06 LAB — BLOOD GAS, VENOUS
Acid-Base Excess: 3 mmol/L — ABNORMAL HIGH (ref 0.0–2.0)
Bicarbonate: 31.2 mmol/L — ABNORMAL HIGH (ref 20.0–28.0)
O2 Saturation: 45.8 %
Patient temperature: 37
pCO2, Ven: 62 mmHg — ABNORMAL HIGH (ref 44–60)
pH, Ven: 7.31 (ref 7.25–7.43)

## 2024-05-06 LAB — URINE DRUG SCREEN, QUALITATIVE (ARMC ONLY)
Amphetamines, Ur Screen: NOT DETECTED
Barbiturates, Ur Screen: POSITIVE — AB
Benzodiazepine, Ur Scrn: NOT DETECTED
Cannabinoid 50 Ng, Ur ~~LOC~~: POSITIVE — AB
Cocaine Metabolite,Ur ~~LOC~~: NOT DETECTED
MDMA (Ecstasy)Ur Screen: NOT DETECTED
Methadone Scn, Ur: NOT DETECTED
Opiate, Ur Screen: NOT DETECTED
Phencyclidine (PCP) Ur S: NOT DETECTED
Tricyclic, Ur Screen: POSITIVE — AB

## 2024-05-06 LAB — RESP PANEL BY RT-PCR (RSV, FLU A&B, COVID)  RVPGX2
Influenza A by PCR: NEGATIVE
Influenza B by PCR: NEGATIVE
Resp Syncytial Virus by PCR: NEGATIVE
SARS Coronavirus 2 by RT PCR: NEGATIVE

## 2024-05-06 LAB — SALICYLATE LEVEL: Salicylate Lvl: <7 mg/dL — ABNORMAL LOW (ref 7.0–30.0)

## 2024-05-06 LAB — AMMONIA: Ammonia: 35 umol/L (ref 9–35)

## 2024-05-06 LAB — ETHANOL: Alcohol, Ethyl (B): <15 mg/dL (ref <15)

## 2024-05-06 LAB — ACETAMINOPHEN LEVEL: Acetaminophen (Tylenol), Serum: <10 ug/mL — ABNORMAL LOW (ref 10–30)

## 2024-05-06 LAB — BRAIN NATRIURETIC PEPTIDE: B Natriuretic Peptide: 55.3 pg/mL (ref 0.0–100.0)

## 2024-05-06 LAB — LACTIC ACID, PLASMA: Lactic Acid, Venous: 1.8 mmol/L (ref 0.5–1.9)

## 2024-05-06 MED ORDER — INSULIN GLARGINE 100 UNIT/ML ~~LOC~~ SOLN
10.0000 [IU] | Freq: Every day | SUBCUTANEOUS | Status: DC
  Filled 2024-05-06: qty 0.1

## 2024-05-06 MED ORDER — SODIUM CHLORIDE 0.9 % IV SOLN
2.0000 g | INTRAVENOUS | Status: DC
  Administered 2024-05-07: 2 g via INTRAVENOUS
  Filled 2024-05-06 (×2): qty 20

## 2024-05-06 MED ORDER — LOSARTAN POTASSIUM 25 MG PO TABS
25.0000 mg | ORAL_TABLET | Freq: Every day | ORAL | Status: DC
  Administered 2024-05-06 – 2024-05-08 (×3): 25 mg via ORAL
  Filled 2024-05-06 (×3): qty 1

## 2024-05-06 MED ORDER — SODIUM CHLORIDE 0.9 % IV SOLN
500.0000 mg | INTRAVENOUS | Status: DC
  Administered 2024-05-07: 500 mg via INTRAVENOUS
  Filled 2024-05-06 (×2): qty 5

## 2024-05-06 MED ORDER — ALBUTEROL SULFATE (2.5 MG/3ML) 0.083% IN NEBU: 3.0000 mL | INHALATION_SOLUTION | Freq: Four times a day (QID) | RESPIRATORY_TRACT | Status: DC | PRN

## 2024-05-06 MED ORDER — ONDANSETRON HCL 4 MG/2ML IJ SOLN
4.0000 mg | Freq: Once | INTRAMUSCULAR | Status: AC
  Administered 2024-05-06: 4 mg via INTRAVENOUS
  Filled 2024-05-06: qty 2

## 2024-05-06 MED ORDER — INSULIN GLARGINE-YFGN 100 UNIT/ML ~~LOC~~ SOLN
10.0000 [IU] | Freq: Every day | SUBCUTANEOUS | Status: DC
  Filled 2024-05-06: qty 0.1

## 2024-05-06 MED ORDER — SODIUM CHLORIDE 0.9 % IV SOLN
2.0000 g | Freq: Once | INTRAVENOUS | Status: AC
  Administered 2024-05-06: 2 g via INTRAVENOUS
  Filled 2024-05-06: qty 20

## 2024-05-06 MED ORDER — INSULIN ASPART 100 UNIT/ML IJ SOLN: 0.0000 [IU] | Freq: Three times a day (TID) | INTRAMUSCULAR | Status: DC

## 2024-05-06 MED ORDER — POTASSIUM CHLORIDE 20 MEQ PO PACK
20.0000 meq | PACK | Freq: Once | ORAL | Status: AC
  Administered 2024-05-06: 20 meq via ORAL
  Filled 2024-05-06: qty 1

## 2024-05-06 MED ORDER — POLYETHYLENE GLYCOL 3350 17 G PO PACK: 17.0000 g | PACK | Freq: Every day | ORAL | Status: DC | PRN

## 2024-05-06 MED ORDER — PANTOPRAZOLE SODIUM 40 MG PO TBEC
40.0000 mg | DELAYED_RELEASE_TABLET | Freq: Two times a day (BID) | ORAL | Status: DC
  Administered 2024-05-06 – 2024-05-07 (×2): 40 mg via ORAL
  Filled 2024-05-06 (×2): qty 1

## 2024-05-06 MED ORDER — ONDANSETRON HCL 4 MG/2ML IJ SOLN: 4.0000 mg | Freq: Four times a day (QID) | INTRAMUSCULAR | Status: DC | PRN

## 2024-05-06 MED ORDER — ACETAMINOPHEN 650 MG RE SUPP: 650.0000 mg | Freq: Four times a day (QID) | RECTAL | Status: DC | PRN

## 2024-05-06 MED ORDER — SODIUM CHLORIDE 0.9 % IV SOLN
500.0000 mg | Freq: Once | INTRAVENOUS | Status: AC
  Administered 2024-05-06: 500 mg via INTRAVENOUS
  Filled 2024-05-06: qty 5

## 2024-05-06 MED ORDER — CARBAMAZEPINE ER 100 MG PO TB12
100.0000 mg | ORAL_TABLET | Freq: Two times a day (BID) | ORAL | Status: DC
  Administered 2024-05-06 – 2024-05-08 (×4): 100 mg via ORAL
  Filled 2024-05-06 (×4): qty 1

## 2024-05-06 MED ORDER — ONDANSETRON HCL 4 MG PO TABS
4.0000 mg | ORAL_TABLET | Freq: Four times a day (QID) | ORAL | Status: DC | PRN
  Administered 2024-05-07: 4 mg via ORAL
  Filled 2024-05-06: qty 1

## 2024-05-06 MED ORDER — ENOXAPARIN SODIUM 40 MG/0.4ML IJ SOSY
40.0000 mg | PREFILLED_SYRINGE | INTRAMUSCULAR | Status: DC
  Administered 2024-05-06 – 2024-05-07 (×2): 40 mg via SUBCUTANEOUS
  Filled 2024-05-06 (×2): qty 0.4

## 2024-05-06 MED ORDER — ESCITALOPRAM OXALATE 10 MG PO TABS
20.0000 mg | ORAL_TABLET | Freq: Every day | ORAL | Status: DC
  Administered 2024-05-06 – 2024-05-08 (×3): 20 mg via ORAL
  Filled 2024-05-06 (×3): qty 2

## 2024-05-06 MED ORDER — INSULIN ASPART 100 UNIT/ML IJ SOLN: 0.0000 [IU] | Freq: Every day | INTRAMUSCULAR | Status: DC

## 2024-05-06 MED ORDER — ACETAMINOPHEN 325 MG PO TABS
650.0000 mg | ORAL_TABLET | Freq: Four times a day (QID) | ORAL | Status: DC | PRN
  Administered 2024-05-06 – 2024-05-08 (×3): 650 mg via ORAL
  Filled 2024-05-06 (×4): qty 2

## 2024-05-06 MED ORDER — FLUTICASONE FUROATE-VILANTEROL 200-25 MCG/ACT IN AEPB
1.0000 | INHALATION_SPRAY | Freq: Every day | RESPIRATORY_TRACT | Status: DC
  Administered 2024-05-07 – 2024-05-08 (×2): 1 via RESPIRATORY_TRACT
  Filled 2024-05-06: qty 28

## 2024-05-06 NOTE — ED Notes (Signed)
 Only able to obtain one set of cultures.

## 2024-05-06 NOTE — Sepsis Progress Note (Signed)
 Elink will follow per sepsis protocol.

## 2024-05-06 NOTE — H&P (Addendum)
 History and Physical    [name] ZOX:[id number] DOB: Feb 24, 1976 DOA: 05/06/2024  DOS: the patient was seen and examined on 05/06/2024  PCP: Wyvonna Heidelberg, MD   Patient coming from: Group Home  I have personally briefly reviewed patient's old medical records in [facility]  Chief Complaint: Cough and shortness of breath  HPI: [name] is a pleasant 48 y.o. male with medical history significant for depression, anxiety, hypertension, CHF, COPD not on home oxygen  comes in from group home for cough and shortness of breath.  Patient was drowsy and sleepy this morning and was brought into the hospital by EMS from group home.  According to the EMS they did not give a very good handoff of what is his baseline.  Patient himself denies using any drugs.  However, he admits that he does smoke.  He denies drinking alcohol . Patient states that he has been having cough and shortness of breath that is the main reason he was brought in for evaluation.  He states that he feels tired and he will closes eyes and does not respond to others when he does not want.  He was asking me if I can give him pain medication.  I advised them that if he sleeps or falls back to sleep is not possible to give him medication for pain.  Then he started talking normally.  He denies any fever or chills.  He denies any nausea or vomiting.  He denies any hemoptysis, hematemesis or melena.  He stated that he has a cough which is productive of yellowish sputum but no blood.  ED Course: Upon arrival to the ED, patient is found to be in the ED patient was found to have right infrahilar infiltrate, received IV ceftriaxone  and azithromycin  and oxygen  for hypoxemia around 87%.  Hospitalist service was consulted for evaluation for admission for community-acquired pneumonia.  Review of Systems:  ROS  All other systems negative except as noted in the HPI.  Past Medical History:  Diagnosis Date   Anxiety    Arthritis     knees and fingers   Asthma    CHF (congestive heart failure) (HCC)    COPD (chronic obstructive pulmonary disease) (HCC)    Depression    Encephalopathy    GERD (gastroesophageal reflux disease)    Hypertension    Hyponatremia 06/2016   Pneumonia 2017   Renal disorder    kidney injury    Past Surgical History:  Procedure Laterality Date   BIOPSY  05/10/2022   Procedure: BIOPSY;  Surgeon: Urban Garden, MD;  [facility]: [facility];  Service: Gastroenterology;;   COLONOSCOPY     ESOPHAGOGASTRODUODENOSCOPY     ESOPHAGOGASTRODUODENOSCOPY (EGD) WITH PROPOFOL  N/A 05/10/2022   Procedure: ESOPHAGOGASTRODUODENOSCOPY (EGD) WITH PROPOFOL ;  Surgeon: Urban Garden, MD;  Location: [facility];  Service: Gastroenterology;  Laterality: N/A;  205   EXTERNAL FIXATION LEG Right 12/01/2018   Procedure: EXTERNAL FIXATION LEG;  Surgeon: Rande Bushy, MD;  Location: [facility] ORS;  Service: Orthopedics;  Laterality: Right;   EXTERNAL FIXATION REMOVAL Right 12/06/2018   Procedure: REMOVAL EXTERNAL FIXATION LEG;  Surgeon: Laneta Pintos, MD;  Location: [facility] OR;  Service: Orthopedics;  Laterality: Right;   FINGER SURGERY Left    5th digit-Fracture-worse now than before   HARDWARE REMOVAL Right 12/06/2019   Procedure: HARDWARE REMOVAL RIGHT TIBIAL PLATEAU;  Surgeon: Laneta Pintos, MD;  Location: [facility] OR;  Service: Orthopedics;  Laterality: Right;   ORIF TIBIA PLATEAU  Right 12/06/2018   Procedure: OPEN REDUCTION INTERNAL FIXATION (ORIF) TIBIAL PLATEAU;  Surgeon: Laneta Pintos, MD;  Location: [facility] OR;  Service: Orthopedics;  Laterality: Right;   PLEURAL EFFUSION DRAINAGE Right 01/02/2016   Procedure: DRAINAGE OF PLEURAL EFFUSION;  Surgeon: Zelphia Higashi, MD;  Location: [facility] OR;  Service: Thoracic;  Laterality: Right;   VIDEO ASSISTED THORACOSCOPY (VATS)/DECORTICATION Right 01/02/2016   Procedure: VIDEO ASSISTED THORACOSCOPY (VATS)/DECORTICATION;  Surgeon: Zelphia Higashi, MD;   Location: [facility] OR;  Service: Thoracic;  Laterality: Right;   VIDEO BRONCHOSCOPY N/A 01/02/2016   Procedure: VIDEO BRONCHOSCOPY;  Surgeon: Zelphia Higashi, MD;  Location: [facility] OR;  Service: Thoracic;  Laterality: N/A;     reports that he has been smoking pipe and cigarettes. He has a 2.5 pack-year smoking history. He quit smokeless tobacco use about 12 years ago. He reports that he does not drink alcohol  and does not use drugs.  Allergies  Allergen Reactions   Codeine Nausea Only   Ibuprofen  Nausea Only   Morphine      UNSPECIFIED REACTION - shaking, diaphoresis   Morphine  And Codeine     UNSPECIFIED REACTION - shaking, diaphoresis   Tramadol  Nausea Only   Penicillins Nausea And Vomiting    Did it involve swelling of the face/tongue/throat, SOB, or low BP? No Did it involve sudden or severe rash/hives, skin peeling, or any reaction on the inside of your mouth or nose? No Did you need to seek medical attention at a hospital or doctor's office? No When did it last happen?      10+years If all above answers are "NO", may proceed with cephalosporin use.     Family History  Problem Relation Age of Onset   CAD Mother 29       Sudden death   Diabetes Mellitus II Father    Prostate cancer Paternal Grandfather     Prior to Admission medications   Medication Sig Start Date End Date Taking? Authorizing Provider  albuterol  (VENTOLIN  HFA) 108 (90 Base) MCG/ACT inhaler Inhale 1 puff into the lungs 4 (four) times daily as needed for wheezing or shortness of breath.    [provider]  escitalopram  (LEXAPRO ) 20 MG tablet Take 20 mg by mouth daily.     [provider]  fluticasone -salmeterol (ADVAIR) 250-50 MCG/ACT AEPB Inhale 1 puff into the lungs in the morning and at bedtime.    [provider]  furosemide  (LASIX ) 20 MG tablet Take 1 tablet (20 mg total) by mouth daily. 09/15/21   Eilleen Grates, MD  loperamide  (IMODIUM ) 2 MG capsule Take 2 mg by mouth as needed for  diarrhea or loose stools. 04/26/22   [provider]  losartan  (COZAAR ) 25 MG tablet Take 25 mg by mouth daily.    [provider]  methocarbamol  (ROBAXIN ) 750 MG tablet Take 750 mg by mouth 2 (two) times daily.    [provider]  pantoprazole  (PROTONIX ) 40 MG tablet TAKE 1 TABLET BY MOUTH TWICE DAILY BEFORE MEALS 06/21/22   Carlan, Chelsea L, NP  primidone  (MYSOLINE ) 250 MG tablet Take 250 mg by mouth 3 (three) times daily.    [provider]  primidone  (MYSOLINE ) 50 MG tablet Take 50 mg by mouth 3 (three) times daily.    [provider]  QUEtiapine  (SEROQUEL ) 25 MG tablet Take 25 mg by mouth at bedtime.    [provider]    Physical Exam: Vitals:   05/06/24 1200 05/06/24 1323 05/06/24 1330 05/06/24 1400  BP: 122/71  114/70 106/66  Pulse: 86  77 78  Resp: (!) 29  (!) 22 (!) 24  Temp:      TempSrc:      SpO2: 93%  96% 95%  Weight:  69.4 kg    Height:        Physical Exam   Labs on Admission: I have personally reviewed following labs and imaging studies  CBC: Recent Labs  Lab 05/06/24 0949  WBC 5.1  NEUTROABS 4.4  HGB 13.4  HCT 40.1  MCV 89.9  PLT 145*   Basic Metabolic Panel: Recent Labs  Lab 05/06/24 0949  NA 133*  K 3.3*  CL 98  CO2 26  GLUCOSE 113*  BUN 9  CREATININE 0.73  CALCIUM 8.2*   GFR: Estimated Creatinine Clearance: 110.8 mL/min (by C-G formula based on SCr of 0.73 mg/dL). Liver Function Tests: Recent Labs  Lab 05/06/24 0949  AST 26  ALT 14  ALKPHOS 55  BILITOT 0.7  PROT 6.4*  ALBUMIN 3.6   No results for input(s): LIPASE, AMYLASE in the last 168 hours. Recent Labs  Lab 05/06/24 0949  AMMONIA 35   Coagulation Profile: No results for input(s): INR, PROTIME in the last 168 hours. Cardiac Enzymes: Recent Labs  Lab 05/06/24 0949 05/06/24 1232  TROPONINIHS 15 26*   BNP (last 3 results) Recent Labs    05/06/24 0949  BNP 55.3   HbA1C: No results for input(s): HGBA1C  in the last 72 hours. CBG: Recent Labs  Lab 05/06/24 0958  GLUCAP 117*   Lipid Profile: No results for input(s): CHOL, HDL, LDLCALC, TRIG, CHOLHDL, LDLDIRECT in the last 72 hours. Thyroid  Function Tests: No results for input(s): TSH, T4TOTAL, FREET4, T3FREE, THYROIDAB in the last 72 hours. Anemia Panel: No results for input(s): VITAMINB12, FOLATE, FERRITIN, TIBC, IRON, RETICCTPCT in the last 72 hours. Urine analysis:    Component Value Date/Time   COLORURINE STRAW (A) 05/12/2021 1740   APPEARANCEUR CLEAR 05/12/2021 1740   APPEARANCEUR Clear 09/11/2014 2308   LABSPEC 1.004 (L) 05/12/2021 1740   LABSPEC 1.003 09/11/2014 2308   PHURINE 7.0 05/12/2021 1740   GLUCOSEU NEGATIVE 05/12/2021 1740   GLUCOSEU Negative 09/11/2014 2308   HGBUR NEGATIVE 05/12/2021 1740   BILIRUBINUR NEGATIVE 05/12/2021 1740   BILIRUBINUR Negative 09/11/2014 2308   KETONESUR NEGATIVE 05/12/2021 1740   PROTEINUR NEGATIVE 05/12/2021 1740   NITRITE NEGATIVE 05/12/2021 1740   LEUKOCYTESUR NEGATIVE 05/12/2021 1740   LEUKOCYTESUR Negative 09/11/2014 2308    Radiological Exams on Admission: I have personally reviewed images DG Chest Portable 1 View Result Date: 05/06/2024 CLINICAL DATA:  Shortness of breath EXAM: PORTABLE CHEST 1 VIEW COMPARISON:  October 11, 2022 FINDINGS: Right hilar infrahilar right lower lobe and minimal lingular infiltrates correlate with the developing bronchopneumonia IMPRESSION: Right hilar infrahilar right lower lobe and minimal lingular infiltrates correlate with the developing bronchopneumonia. Electronically Signed   By: Fredrich Jefferson M.D.   On: 05/06/2024 10:33   CT Cervical Spine Wo Contrast Result Date: 05/06/2024 CLINICAL DATA:  48 year old male with altered mental status, nausea, headache. EXAM: CT CERVICAL SPINE WITHOUT CONTRAST TECHNIQUE: Multidetector CT imaging of the cervical spine was performed without intravenous contrast. Multiplanar CT  image reconstructions were also generated. RADIATION DOSE REDUCTION: This exam was performed according to the departmental dose-optimization program which includes automated exposure control, adjustment of the mA and/or kV according to patient size and/or use of iterative reconstruction technique. COMPARISON:  Head CT today.  Cervical spine CT 05/02/2017. FINDINGS:  Alignment: Chronic but progressed reversal of the normal cervical lordosis since 2018, with stable levels of involvement especially C4-C5. Mild degenerative appearing anterolisthesis at that level has progressed while similar degenerative anterolisthesis of C7 on T1 is stable. Bilateral posterior element alignment is within normal limits. Skull base and vertebrae: Mild motion artifact, most pronounced at the C5 and C6 levels. Visualized skull base is intact. No atlanto-occipital dissociation. C1 and C2 appear intact and aligned. Degenerative appearing C6 vertebral body sclerosis, associated endplate irregularity is new since 2018. No acute osseous abnormality identified. Soft tissues and spinal canal: No prevertebral fluid or swelling. No visible canal hematoma. Mild motion artifact. Negative visible noncontrast neck soft tissues. Disc levels: Chronic but progressed cervical spine degeneration since 2018, including bilateral facet arthropathy, degenerative appearing anterolisthesis C4-C5 and C7-T1. Advanced chronic disc and endplate degeneration at C5-C6 and C6-C7 has progressed. Less pronounced C4-C5 disc degeneration but new vacuum disc there. Up to mild associated spinal stenosis. Upper chest: Visible upper thoracic levels appear stable since 2018, mild chronic T3 compression fracture. Chronic lung disease partially visible, increased apical scarring and architectural distortion since 2018. Additional sub solid peribronchial opacity in the lung apices is new (series 4, image 105 on the right). IMPRESSION: 1. Mild motion artifact, no acute traumatic  injury identified in the cervical spine. 2. 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 3. Chronic apical lung disease, but superimposed peribronchial sub solid opacity raising the possibility of acute infection/bronchopneumonia. Electronically Signed   By: Marlise Simpers M.D.   On: 05/06/2024 10:24   CT Head Wo Contrast Result Date: 05/06/2024 CLINICAL DATA:  48 year old male with altered mental status, nausea, headache. EXAM: CT HEAD WITHOUT CONTRAST TECHNIQUE: Contiguous axial images were obtained from the base of the skull through the vertex without intravenous contrast. RADIATION DOSE REDUCTION: This exam was performed according to the departmental dose-optimization program which includes automated exposure control, adjustment of the mA and/or kV according to patient size and/or use of iterative reconstruction technique. COMPARISON:  Head CT 05/02/2017. FINDINGS: Brain: Stable cerebral volume. No midline shift, ventriculomegaly, mass effect, evidence of mass lesion, intracranial hemorrhage or evidence of cortically based acute infarction. Gray-white matter differentiation is within normal limits throughout the brain. Vascular: No suspicious intracranial vascular hyperdensity. Skull: Appears stable and intact. Mild motion artifact at the skull base today. Sinuses/Orbits: Visualized paranasal sinuses and mastoids are stable and well aerated. Other: Visualized orbits and scalp soft tissues are within normal limits. IMPRESSION: Stable and negative noncontrast Head CT. Electronically Signed   By: Marlise Simpers M.D.   On: 05/06/2024 10:20    EKG: My personal interpretation of EKG shows:     Assessment/Plan Principal Problem:   Pneumonia Active Problems:   Seizure disorder (HCC) and pseudoseizures   MDD (major depressive disorder), recurrent severe, without psychosis (HCC)   Gastroesophageal reflux disease   Hyponatremia   COPD (chronic obstructive pulmonary disease) (HCC)      48 year old male group home resident W/PMH of depression, COPD, GERD, seizure disorder , Anxiety, HTN, CHF who was brought in to ED from group home for cough and shortness of breath and drowsiness.  It appears that patient is not drowsy and appropriately communicating but sometimes she does not want to cooperate and presents likely sleeping.  It appears that he has community-acquired pneumonia on x-ray.  1.  Acute hypoxemic respiratory failure/pneumonia with hypoxemia requiring oxygen , room air oxygen  was 87% - He will be admitted to the hospital as inpatient  for Community acquired pneumonia with hypoxemia - Patient received ceftriaxone  and azithromycin  in emergency room. - Will continue ceftriaxone  and azithromycin  and follow-up cultures. - He will be given oxygen  to maintain saturation more than 90% - He will be also given nebulization, inhaler. - I will give him some cough medications. - Continue to monitor his oxygenation. - He has a history of congestive heart failure, will hold diuretics, no IV fluid at this point.  2.  Depressive disorder/anxiety - He appears to be at baseline - He denies any suicidal ideation - Continue to monitor and continue Celexa  3.  Seizure disorder - Will continue his home medications at home dose.  4.  Hyponatremia: - Sodium level is 133 - Will continue to monitor sodium level  5.  Hypokalemia at 3.3 - Will give a dose of potassium and check potassium level in the morning   DVT prophylaxis: Lovenox  Code Status: Full Code Family Communication: Not available Disposition Plan: Group home Consults called: none Admission status: Inpatient, Med-Surg   Beatris Bough, MD [facility] 05/06/2024, 2:19 PM

## 2024-05-06 NOTE — Consult Note (Signed)
 CODE SEPSIS - PHARMACY COMMUNICATION  **Broad Spectrum Antibiotics should be administered within 1 hour of Sepsis diagnosis**  Time Code Sepsis Called/Page Received: 1205  Antibiotics Ordered: Ceftriaxone  and azithromycin   Time of 1st antibiotic administration: ceftriaxone  at 1233  Additional action taken by pharmacy: none  If necessary, Name of Provider/Nurse Contacted: n/a   Chinara Hertzberg Rodriguez-Guzman PharmD, BCPS 05/06/2024 1:06 PM

## 2024-05-06 NOTE — ED Provider Notes (Signed)
 [facility] Provider Note    Event Date/Time   First MD Initiated Contact with Patient 05/06/24 [phone]     (approximate)   History   No chief complaint on file.   HPI  [name] is a 48 y.o. male with depression, anxiety, hypertension, CHF, COPD who comes in from group home.  I reviewed a note from 05/16/2022 where he was seen for a refill of his Klonopin .   I reviewed a note where patient is on buprenorphine, naloxone .  Unclear of last known normal.  Patient is from a group home.  Patient this morning was noted to be nauseous and not acting his normal self.  Is unclear when his last known normal was.  According to EMS they did not give a very good handoff of what his baseline is.  Patient himself denies using any drugs.  He denies any pain.  Called pt wife without answer.     Physical Exam   Triage Vital Signs: ED Triage Vitals  Encounter Vitals Group     BP      Girls Systolic BP Percentile      Girls Diastolic BP Percentile      Boys Systolic BP Percentile      Boys Diastolic BP Percentile      Pulse      Resp      Temp      Temp src      SpO2      Weight      Height      Head Circumference      Peak Flow      Pain Score      Pain Loc      Pain Education      Exclude from Growth Chart     Most recent vital signs: Vitals:   05/06/24 1030 05/06/24 1110  BP: 111/71 (!) 114/97  Pulse: 84 79  Resp: 19 20  Temp:  98.6 F (37 C)  SpO2: 94% 92%     General: Awake, no distress.  CV:  Good peripheral perfusion.  Resp:  Normal effort.  Abd:  No distention. Soft and non tender  Other:  Moves all extremities. No focal deficits. Appears sleepy but wakes up and aox2, moving all extremities, eye crossing midline.  Pupils reactive bilaterally.  ED Results / Procedures / Treatments   Labs (all labs ordered are listed, but only abnormal results are displayed) Labs Reviewed  COMPREHENSIVE METABOLIC PANEL WITH GFR - Abnormal; Notable  for the following components:      Result Value   Sodium 133 (*)    Potassium 3.3 (*)    Glucose, Bld 113 (*)    Calcium 8.2 (*)    Total Protein 6.4 (*)    All other components within normal limits  BLOOD GAS, VENOUS - Abnormal; Notable for the following components:   pCO2, Ven 62 (*)    Bicarbonate 31.2 (*)    Acid-Base Excess 3.0 (*)    All other components within normal limits  SALICYLATE LEVEL - Abnormal; Notable for the following components:   Salicylate Lvl <7.0 (*)    All other components within normal limits  ACETAMINOPHEN  LEVEL - Abnormal; Notable for the following components:   Acetaminophen  (Tylenol ), Serum <10 (*)    All other components within normal limits  CBC WITH DIFFERENTIAL/PLATELET - Abnormal; Notable for the following components:   Platelets 145 (*)    Lymphs Abs 0.3 (*)  All other components within normal limits  CBG MONITORING, ED - Abnormal; Notable for the following components:   Glucose-Capillary 117 (*)    All other components within normal limits  RESP PANEL BY RT-PCR (RSV, FLU A&B, COVID)  [id number]  CULTURE, BLOOD (ROUTINE X 2)  CULTURE, BLOOD (ROUTINE X 2)  AMMONIA  ETHANOL  LACTIC ACID, PLASMA  CBC WITH DIFFERENTIAL/PLATELET  URINALYSIS, ROUTINE W REFLEX MICROSCOPIC  URINE DRUG SCREEN, QUALITATIVE ([facility] ONLY)  BRAIN NATRIURETIC PEPTIDE  LACTIC ACID, PLASMA  TROPONIN I (HIGH SENSITIVITY)  TROPONIN I (HIGH SENSITIVITY)     EKG  My interpretation of EKG:  Normal sinus rhythm 93 without any ST elevation or T wave inversions, normal intervals  RADIOLOGY I have reviewed the ct personally and interpreted no evidence of intercranial hemorrhage   PROCEDURES:  Critical Care performed: yes   .1-3 Lead EKG Interpretation  Performed by: Lubertha Rush, MD Authorized by: Lubertha Rush, MD     Interpretation: normal     ECG rate:  70   ECG rate assessment: normal     Rhythm: sinus rhythm     Ectopy: none     Conduction: normal    .Critical Care  Performed by: Lubertha Rush, MD Authorized by: Lubertha Rush, MD   Critical care provider statement:    Critical care time (minutes):  30   Critical care was necessary to treat or prevent imminent or life-threatening deterioration of the following conditions:  Respiratory failure   Critical care was time spent personally by me on the following activities:  Development of treatment plan with patient or surrogate, discussions with consultants, evaluation of patient's response to treatment, examination of patient, ordering and review of laboratory studies, ordering and review of radiographic studies, ordering and performing treatments and interventions, pulse oximetry, re-evaluation of patient's condition and review of old charts    MEDICATIONS ORDERED IN ED: Medications  cefTRIAXone  (ROCEPHIN ) 2 g in sodium chloride  0.9 % 100 mL IVPB (2 g Intravenous New Bag/Given 05/06/24 1233)  azithromycin  (ZITHROMAX ) 500 mg in sodium chloride  0.9 % 250 mL IVPB (has no administration in time range)  cefTRIAXone  (ROCEPHIN ) 2 g in sodium chloride  0.9 % 100 mL IVPB (has no administration in time range)  azithromycin  (ZITHROMAX ) 500 mg in sodium chloride  0.9 % 250 mL IVPB (has no administration in time range)  enoxaparin  (LOVENOX ) injection 40 mg (has no administration in time range)  acetaminophen  (TYLENOL ) tablet 650 mg (has no administration in time range)    Or  acetaminophen  (TYLENOL ) suppository 650 mg (has no administration in time range)  ondansetron  (ZOFRAN ) tablet 4 mg (has no administration in time range)    Or  ondansetron  (ZOFRAN ) injection 4 mg (has no administration in time range)  polyethylene glycol (MIRALAX  / GLYCOLAX ) packet 17 g (has no administration in time range)  ondansetron  (ZOFRAN ) injection 4 mg (4 mg Intravenous Given 05/06/24 1020)     IMPRESSION / MDM / ASSESSMENT AND PLAN / ED COURSE  I reviewed the triage vital signs and the nursing notes.   Patient's  presentation is most consistent with acute presentation with potential threat to life or bodily function.   Patient comes in with altered mental status and some somnolence.  Workup done to evaluate for intercranial hemorrhage, cervical fracture, hypercapnia, UTI, drugs, AKI, electrolyte abnormalities.  Glucose was normal.  VBG shows CO2 only slightly elevated at 62 with normal bicarb.  He has a chronic retainer so this is probably more  chronic unlikely the cause of his mental status.  His CMP shows reassuring creatinine.  Ammonia level is negative alcohol  negative salicylate, Tylenol  are negative COVID, flu are negative CBC reassuring lactate is normal  X-ray concerning for possible developing pneumonia.  I tried calling patient's wife and we are try to figure out the group home's name and number to get some collateral information.  I do not see any thing obvious that represents an LVO  Patient is now up and walking around.  He is alert and oriented denies any drug use.  He does report having a cough and we attempted to take him off oxygen  and per nurses he desatted down to 87% so placed back on 2 L and discussed the hospital team for admission   The patient is on the cardiac monitor to evaluate for evidence of arrhythmia and/or significant heart rate changes.      FINAL CLINICAL IMPRESSION(S) / ED DIAGNOSES   Final diagnoses:  Acute respiratory failure with hypoxia (HCC)  Pneumonia due to infectious organism, unspecified laterality, unspecified part of lung     Rx / DC Orders   ED Discharge Orders     None        Note:  This document was prepared using Dragon voice recognition software and may include unintentional dictation errors.   Lubertha Rush, MD 05/06/24 1242

## 2024-05-06 NOTE — ED Notes (Signed)
 Pt ambulatory to restroom. Urine sent to lab. Fall precautions in place.

## 2024-05-06 NOTE — ED Triage Notes (Signed)
 Pt presents to the ED via [facility] from group home. EMS called out for nausea. Group home reports that pt was not his normal self, but they did not explain further to EMS.   Group home: [address] [place], [place] [address]  NaCl LR Co2 23-39 RR 40's 98.5 axillary CBG 110 BP 127/81

## 2024-05-07 ENCOUNTER — Encounter: Payer: Self-pay | Admitting: Hospitalist

## 2024-05-07 ENCOUNTER — Other Ambulatory Visit: Payer: Self-pay

## 2024-05-07 DIAGNOSIS — J189 Pneumonia, unspecified organism: Secondary | ICD-10-CM | POA: Diagnosis not present

## 2024-05-07 DIAGNOSIS — G40909 Epilepsy, unspecified, not intractable, without status epilepticus: Secondary | ICD-10-CM | POA: Diagnosis not present

## 2024-05-07 DIAGNOSIS — J449 Chronic obstructive pulmonary disease, unspecified: Secondary | ICD-10-CM

## 2024-05-07 LAB — CBC
HCT: 32.9 % — ABNORMAL LOW (ref 39.0–52.0)
Hemoglobin: 11.2 g/dL — ABNORMAL LOW (ref 13.0–17.0)
MCH: 30.7 pg (ref 26.0–34.0)
MCHC: 34 g/dL (ref 30.0–36.0)
MCV: 90.1 fL (ref 80.0–100.0)
Platelets: 153 10*3/uL (ref 150–400)
RBC: 3.65 MIL/uL — ABNORMAL LOW (ref 4.22–5.81)
RDW: 13 % (ref 11.5–15.5)
WBC: 10.7 10*3/uL — ABNORMAL HIGH (ref 4.0–10.5)
nRBC: 0 % (ref 0.0–0.2)

## 2024-05-07 LAB — BASIC METABOLIC PANEL WITH GFR
Anion gap: 6 (ref 5–15)
BUN: 14 mg/dL (ref 6–20)
CO2: 30 mmol/L (ref 22–32)
Calcium: 8.2 mg/dL — ABNORMAL LOW (ref 8.9–10.3)
Chloride: 103 mmol/L (ref 98–111)
Creatinine, Ser: 0.6 mg/dL — ABNORMAL LOW (ref 0.61–1.24)
GFR, Estimated: >60 mL/min (ref >60)
Glucose, Bld: 95 mg/dL (ref 70–99)
Potassium: 3.8 mmol/L (ref 3.5–5.1)
Sodium: 139 mmol/L (ref 135–145)

## 2024-05-07 LAB — CBG MONITORING, ED: Glucose-Capillary: 147 mg/dL — ABNORMAL HIGH (ref 70–99)

## 2024-05-07 LAB — HEMOGLOBIN A1C
Hgb A1c MFr Bld: 5.2 % (ref 4.8–5.6)
Mean Plasma Glucose: 102.54 mg/dL

## 2024-05-07 LAB — MRSA NEXT GEN BY PCR, NASAL: MRSA by PCR Next Gen: DETECTED — AB

## 2024-05-07 LAB — HIV ANTIBODY (ROUTINE TESTING W REFLEX): HIV Screen 4th Generation wRfx: NONREACTIVE

## 2024-05-07 MED ORDER — PRIMIDONE 50 MG PO TABS
50.0000 mg | ORAL_TABLET | Freq: Three times a day (TID) | ORAL | Status: DC
  Administered 2024-05-07 – 2024-05-08 (×3): 50 mg via ORAL
  Filled 2024-05-07 (×4): qty 1

## 2024-05-07 MED ORDER — PRIMIDONE 250 MG PO TABS
250.0000 mg | ORAL_TABLET | Freq: Three times a day (TID) | ORAL | Status: DC
  Administered 2024-05-07 – 2024-05-08 (×3): 250 mg via ORAL
  Filled 2024-05-07 (×4): qty 1

## 2024-05-07 MED ORDER — METHOCARBAMOL 500 MG PO TABS
750.0000 mg | ORAL_TABLET | Freq: Three times a day (TID) | ORAL | Status: DC | PRN
  Administered 2024-05-07 – 2024-05-08 (×2): 750 mg via ORAL
  Filled 2024-05-07 (×2): qty 2

## 2024-05-07 MED ORDER — IPRATROPIUM-ALBUTEROL 0.5-2.5 (3) MG/3ML IN SOLN
3.0000 mL | Freq: Three times a day (TID) | RESPIRATORY_TRACT | Status: DC
  Administered 2024-05-07 – 2024-05-08 (×2): 3 mL via RESPIRATORY_TRACT
  Filled 2024-05-07 (×2): qty 3

## 2024-05-07 MED ORDER — LORATADINE 10 MG PO TABS
10.0000 mg | ORAL_TABLET | Freq: Every day | ORAL | Status: DC
  Administered 2024-05-07 – 2024-05-08 (×2): 10 mg via ORAL
  Filled 2024-05-07 (×2): qty 1

## 2024-05-07 MED ORDER — GABAPENTIN 300 MG PO CAPS
300.0000 mg | ORAL_CAPSULE | Freq: Three times a day (TID) | ORAL | Status: DC
  Administered 2024-05-07 – 2024-05-08 (×3): 300 mg via ORAL
  Filled 2024-05-07 (×3): qty 1

## 2024-05-07 MED ORDER — QUETIAPINE FUMARATE 25 MG PO TABS
50.0000 mg | ORAL_TABLET | Freq: Every day | ORAL | Status: DC
  Administered 2024-05-07: 50 mg via ORAL
  Filled 2024-05-07: qty 2

## 2024-05-07 MED ORDER — HYDROXYZINE HCL 50 MG PO TABS
50.0000 mg | ORAL_TABLET | Freq: Four times a day (QID) | ORAL | Status: DC | PRN
  Administered 2024-05-08 (×2): 50 mg via ORAL
  Filled 2024-05-07 (×3): qty 1

## 2024-05-07 MED ORDER — BUPRENORPHINE HCL-NALOXONE HCL 8-2 MG SL SUBL
0.5000 | SUBLINGUAL_TABLET | Freq: Four times a day (QID) | SUBLINGUAL | Status: DC
  Administered 2024-05-07 – 2024-05-08 (×3): 0.5 via SUBLINGUAL
  Filled 2024-05-07 (×3): qty 1

## 2024-05-07 MED ORDER — IPRATROPIUM-ALBUTEROL 0.5-2.5 (3) MG/3ML IN SOLN
3.0000 mL | Freq: Three times a day (TID) | RESPIRATORY_TRACT | Status: DC
  Administered 2024-05-07: 3 mL via RESPIRATORY_TRACT
  Filled 2024-05-07: qty 3

## 2024-05-07 MED ORDER — HYDROCODONE-ACETAMINOPHEN 5-325 MG PO TABS
1.0000 | ORAL_TABLET | Freq: Four times a day (QID) | ORAL | Status: DC | PRN
  Administered 2024-05-07 – 2024-05-08 (×5): 1 via ORAL
  Filled 2024-05-07 (×5): qty 1

## 2024-05-07 MED ORDER — PANTOPRAZOLE SODIUM 40 MG PO TBEC
40.0000 mg | DELAYED_RELEASE_TABLET | Freq: Every day | ORAL | Status: DC
  Administered 2024-05-08: 40 mg via ORAL
  Filled 2024-05-07: qty 1

## 2024-05-07 MED ORDER — MIRTAZAPINE 15 MG PO TABS
15.0000 mg | ORAL_TABLET | Freq: Every day | ORAL | Status: DC
  Administered 2024-05-07: 15 mg via ORAL
  Filled 2024-05-07: qty 1

## 2024-05-07 MED ORDER — NICOTINE 21 MG/24HR TD PT24
21.0000 mg | MEDICATED_PATCH | Freq: Every day | TRANSDERMAL | Status: DC
  Administered 2024-05-07 – 2024-05-08 (×2): 21 mg via TRANSDERMAL
  Filled 2024-05-07 (×2): qty 1

## 2024-05-07 NOTE — Progress Notes (Signed)
  Progress Note   Patient: [name] WGN:[id number] DOB: [birth year] DOA: 05/06/2024     1 DOS: the patient was seen and examined on 05/07/2024   Brief hospital course: Tyronne Blann is a pleasant 48 y.o. male with medical history significant for depression, anxiety, hypertension, CHF, COPD not on home oxygen  comes in from group home for cough and shortness of breath.. productive cough. ... See H&P for full HPI on admission & ED course.  Patient was found to have community-acquired pneumonia complicated by acute respiratory failure with hypoxia, was admitted to the hospital and started on IV antibiotics and supplemental oxygen .  Further hospital course and management as outlined below.    Assessment and Plan:  Community-acquired Pneumonia complicated by  Acute respiratory failure with hypoxia  COPD - stable, not wheezing to suggest exacerbation, hold off steroids --Continue Rocephin /Zithromax  --Supplement O2 to keep sats > 90%, wean as tolerated --Follow cultures --Bronchodilators, mucolytics --IS and flutter  Hypertension  --Continue losartan   Depressive disorder/anxiety - stable --Continue home Celexa, Remeron, Seroquel , PRN hydroxyzine .    Seizure disorder --Continue home Tegretol  and primidone    Hyponatremia - na 133 on admission - resolved --Monitor BMP   Hypokalemia - K 3.3 on admission was replaced - resolved --Monitor BMP  GERD  --Continue PPI      Subjective: Pt seen with caregiver at bedside today. He reports feeling better.  States he does not remember much of yesterday, he was so sick.  Asking about when he can go home.  Agreeable to stay given still needing oxygen  today.  Denies other complaints.    Physical Exam: Vitals:   05/07/24 0544 05/07/24 0736 05/07/24 0818 05/07/24 0903  BP:   105/64 134/65  Pulse: 64  65 82  Resp:   18 (!) 24  Temp:  98.2 F (36.8 C) 98.4 F (36.9 C) 98.3 F (36.8 C)  TempSrc:  Oral Oral   SpO2: 96%  95% 90%  Weight:       Height:       General exam: awake, alert, no acute distress HEENT: moist mucus membranes, hearing grossly normal  Respiratory system: CTAB, no wheezes, +rhonchi, normal respiratory effort at rest on 2 L/min  O2. Cardiovascular system: normal S1/S2, RRR, no pedal edema.   Gastrointestinal system: soft, NT, ND, no HSM felt, +bowel sounds. Central nervous system: A&O x3. no gross focal neurologic deficits, normal speech Extremities: moves all, no edema, normal tone Skin: dry, intact, normal temperature Psychiatry: normal mood, congruent affect, judgement and insight appear normal   Data Reviewed:  Notable labs -- Cr 0.6, Ca 8.2 otherwise normal BMP WBC 10.7, Hbg 11.2  Family Communication: caregiver at bedside  Disposition: Status is: Inpatient Remains inpatient appropriate because: still requiring oxygen , remains on IV antibiotics pending further improvement and weaning oxygen    Planned Discharge Destination: Home    Time spent: 42 minutes  Author: Montey Apa, DO 05/07/2024 2:50 PM  For on call review [email or web address].

## 2024-05-07 NOTE — ED Notes (Signed)
 Pts sp02 decreasing to 87-89 while asleep. 02 2L started via [place] o2 97%

## 2024-05-07 NOTE — Plan of Care (Signed)
  Problem: Fluid Volume: Goal: Ability to maintain a balanced intake and output will improve Outcome: Progressing   Problem: Clinical Measurements: Goal: Cardiovascular complication will be avoided Outcome: Progressing   Problem: Activity: Goal: Risk for activity intolerance will decrease Outcome: Progressing   Problem: Nutrition: Goal: Adequate nutrition will be maintained Outcome: Progressing   Problem: Coping: Goal: Level of anxiety will decrease Outcome: Progressing   Problem: Elimination: Goal: Will not experience complications related to urinary retention Outcome: Progressing   Problem: Pain Managment: Goal: General experience of comfort will improve and/or be controlled Outcome: Progressing   Problem: Safety: Goal: Ability to remain free from injury will improve Outcome: Progressing   Problem: Skin Integrity: Goal: Risk for impaired skin integrity will decrease Outcome: Progressing

## 2024-05-07 NOTE — Inpatient Diabetes Management (Signed)
 Inpatient Diabetes Program Recommendations  AACE/ADA: New Consensus Statement on Inpatient Glycemic Control   Target Ranges:  Prepandial:   less than 140 mg/dL      Peak postprandial:   less than 180 mg/dL (1-2 hours)      Critically ill patients:  140 - 180 mg/dL    Latest Reference Range & Units 05/06/24 09:58 05/06/24 18:16 05/06/24 21:39 05/07/24 02:35  Glucose-Capillary 70 - 99 mg/dL 604 (H) 540 (H) 61 (L) 147 (H)    Latest Reference Range & Units 05/06/24 09:49 05/07/24 05:22  Glucose 70 - 99 mg/dL 981 (H) 95   Review of Glycemic Control  Diabetes history: No Outpatient Diabetes medications: NA Current orders for Inpatient glycemic control: Semglee 10 units QHS  Inpatient Diabetes Program Recommendations:    Insulin : No DM hx noted and no DM medications listed on current home medication list. CBGs 61-147 mg/dl over past 24 hours and Lab glucose 95 mg/dl this morning. Per MAR, Semglee was NOT GIVEN last night.  Please discontinue Semglee 10 units at bedtime and CBGs.  Thanks, Beacher Limerick, RN, MSN, CDCES Diabetes Coordinator Inpatient Diabetes Program [phone] (Team Pager from 8am to 5pm)

## 2024-05-07 NOTE — ED Notes (Signed)
Informed RN bed assigned 

## 2024-05-08 ENCOUNTER — Other Ambulatory Visit: Payer: Self-pay

## 2024-05-08 DIAGNOSIS — J189 Pneumonia, unspecified organism: Secondary | ICD-10-CM | POA: Diagnosis not present

## 2024-05-08 LAB — BASIC METABOLIC PANEL WITH GFR
Anion gap: 8 (ref 5–15)
BUN: 17 mg/dL (ref 6–20)
CO2: 31 mmol/L (ref 22–32)
Calcium: 8.6 mg/dL — ABNORMAL LOW (ref 8.9–10.3)
Chloride: 101 mmol/L (ref 98–111)
Creatinine, Ser: 0.62 mg/dL (ref 0.61–1.24)
GFR, Estimated: >60 mL/min (ref >60)
Glucose, Bld: 102 mg/dL — ABNORMAL HIGH (ref 70–99)
Potassium: 4.5 mmol/L (ref 3.5–5.1)
Sodium: 140 mmol/L (ref 135–145)

## 2024-05-08 LAB — CBC
HCT: 36.1 % — ABNORMAL LOW (ref 39.0–52.0)
Hemoglobin: 12.1 g/dL — ABNORMAL LOW (ref 13.0–17.0)
MCH: 30.6 pg (ref 26.0–34.0)
MCHC: 33.5 g/dL (ref 30.0–36.0)
MCV: 91.4 fL (ref 80.0–100.0)
Platelets: 143 10*3/uL — ABNORMAL LOW (ref 150–400)
RBC: 3.95 MIL/uL — ABNORMAL LOW (ref 4.22–5.81)
RDW: 12.8 % (ref 11.5–15.5)
WBC: 8.8 10*3/uL (ref 4.0–10.5)
nRBC: 0 % (ref 0.0–0.2)

## 2024-05-08 LAB — LEGIONELLA PNEUMOPHILA SEROGP 1 UR AG: L. pneumophila Serogp 1 Ur Ag: NEGATIVE

## 2024-05-08 MED ORDER — AMOXICILLIN-POT CLAVULANATE 875-125 MG PO TABS
1.0000 | ORAL_TABLET | Freq: Two times a day (BID) | ORAL | 0 refills | Status: AC
  Filled 2024-05-08: qty 10, 5d supply, fill #0

## 2024-05-08 NOTE — Discharge Summary (Signed)
 Physician Discharge Summary   Patient: [name] MRN: [id number] DOB: 1976/09/23  Admit date:     05/06/2024  Discharge date: 05/08/24  Discharge Physician: Ezzard Holms   PCP: Wyvonna Heidelberg, MD   Recommendations at discharge:  Follow-up with PCP  Discharge Diagnoses:  Community-acquired Pneumonia complicated by  Acute respiratory failure with hypoxia  COPD  Hypertension  Depressive disorder/anxiety - stable Seizure disorder Hyponatremia Hypokalemia     Hospital Course: [name] is a pleasant 48 y.o. male with medical history significant for depression, anxiety, hypertension, CHF, COPD not on home oxygen  comes in from group home for cough and shortness of breath.. productive cough  Patient was found to have community-acquired pneumonia complicated by acute respiratory failure with hypoxia, was admitted to the hospital and started on IV antibiotics and supplemental oxygen .  Patient has now been weaned off oxygen  able to ambulate about with no needs and therefore being discharged home to complete a course of antibiotics.   Consultants: None Procedures performed: None Disposition: Group home Diet recommendation:  Cardiac diet DISCHARGE MEDICATION: Allergies as of 05/08/2024       Reactions   Codeine Nausea Only   Ibuprofen  Nausea Only   Morphine     UNSPECIFIED REACTION - shaking, diaphoresis   Morphine  And Codeine    UNSPECIFIED REACTION - shaking, diaphoresis   Tramadol  Nausea Only   Penicillins Nausea And Vomiting   Did it involve swelling of the face/tongue/throat, SOB, or low BP? No Did it involve sudden or severe rash/hives, skin peeling, or any reaction on the inside of your mouth or nose? No Did you need to seek medical attention at a hospital or doctor's office? No When did it last happen?      10+years If all above answers are "NO", may proceed with cephalosporin use.        Medication List     STOP taking these medications     pantoprazole  40 MG tablet Commonly known as: PROTONIX        TAKE these medications    albuterol  108 (90 Base) MCG/ACT inhaler Commonly known as: VENTOLIN  HFA Inhale 1 puff into the lungs 4 (four) times daily as needed for wheezing or shortness of breath.   amoxicillin -clavulanate 875-125 MG tablet Commonly known as: AUGMENTIN  Take 1 tablet by mouth 2 (two) times daily for 5 days.   buprenorphine-naloxone  8-2 mg Subl SL tablet Commonly known as: SUBOXONE Place 0.5 tablets under the tongue 4 (four) times daily.   carbamazepine  100 MG 12 hr tablet Commonly known as: TEGRETOL  XR Take 100 mg by mouth 2 (two) times daily.   cetirizine 10 MG tablet Commonly known as: ZYRTEC Take 10 mg by mouth daily.   ergocalciferol  1.25 MG (50000 UT) capsule Commonly known as: VITAMIN D2 Take 50,000 Units by mouth once a week.   escitalopram  20 MG tablet Commonly known as: LEXAPRO  Take 20 mg by mouth daily.   fluticasone -salmeterol 250-50 MCG/ACT Aepb Commonly known as: ADVAIR Inhale 1 puff into the lungs in the morning and at bedtime.   folic acid  1 MG tablet Commonly known as: FOLVITE  Take 1 mg by mouth daily.   furosemide  20 MG tablet Commonly known as: LASIX  Take 1 tablet (20 mg total) by mouth daily.   gabapentin  300 MG capsule Commonly known as: NEURONTIN  Take 300 mg by mouth 3 (three) times daily.   guaiFENesin  600 MG 12 hr tablet Commonly known as: MUCINEX  Take 600 mg by mouth 2 (two) times daily. For  sinus headache   hydrOXYzine  50 MG tablet Commonly known as: ATARAX  Take 50 mg by mouth every 6 (six) hours as needed for anxiety.   loperamide  2 MG capsule Commonly known as: IMODIUM  Take 2 mg by mouth as needed for diarrhea or loose stools.   losartan  25 MG tablet Commonly known as: COZAAR  Take 25 mg by mouth daily.   methocarbamol  750 MG tablet Commonly known as: ROBAXIN  Take 750 mg by mouth 3 (three) times daily.   mirtazapine 15 MG tablet Commonly  known as: REMERON Take 15 mg by mouth at bedtime.   omeprazole 40 MG capsule Commonly known as: PRILOSEC Take 40 mg by mouth daily.   primidone  250 MG tablet Commonly known as: MYSOLINE  Take 250 mg by mouth 3 (three) times daily.   primidone  50 MG tablet Commonly known as: MYSOLINE  Take 50 mg by mouth 3 (three) times daily.   QUEtiapine  25 MG tablet Commonly known as: SEROQUEL  Take 50 mg by mouth at bedtime.   Repatha SureClick 140 MG/ML Soaj Generic drug: Evolocumab Inject 140 mg into the skin every 14 (fourteen) days.   rosuvastatin 40 MG tablet Commonly known as: CRESTOR Take 40 mg by mouth daily.        Discharge Exam: Filed Weights   05/06/24 1323  Weight: 69.4 kg   General exam: awake, alert, no acute distress HEENT: moist mucus membranes, hearing grossly normal  Respiratory system: CTAB Cardiovascular system: normal S1/S2, RRR, no pedal edema.   Gastrointestinal system: soft, NT, ND, no HSM felt, +bowel sounds. Central nervous system: A&O x3. no gross focal neurologic deficits, normal speech Extremities: moves all, no edema, normal tone Skin: dry, intact, normal temperature Psychiatry: normal mood, congruent affect, judgement and insight appear normal  Condition at discharge: good  The results of significant diagnostics from this hospitalization (including imaging, microbiology, ancillary and laboratory) are listed below for reference.   Imaging Studies: DG Chest Portable 1 View Result Date: 05/06/2024 CLINICAL DATA:  Shortness of breath EXAM: PORTABLE CHEST 1 VIEW COMPARISON:  October 11, 2022 FINDINGS: Right hilar infrahilar right lower lobe and minimal lingular infiltrates correlate with the developing bronchopneumonia IMPRESSION: Right hilar infrahilar right lower lobe and minimal lingular infiltrates correlate with the developing bronchopneumonia. Electronically Signed   By: Fredrich Jefferson M.D.   On: 05/06/2024 10:33   CT Cervical Spine Wo  Contrast Result Date: 05/06/2024 CLINICAL DATA:  47 year old male with altered mental status, nausea, headache. EXAM: CT CERVICAL SPINE WITHOUT CONTRAST TECHNIQUE: Multidetector CT imaging of the cervical spine was performed without intravenous contrast. Multiplanar CT image reconstructions were also generated. RADIATION DOSE REDUCTION: This exam was performed according to the departmental dose-optimization program which includes automated exposure control, adjustment of the mA and/or kV according to patient size and/or use of iterative reconstruction technique. COMPARISON:  Head CT today.  Cervical spine CT 05/02/2017. FINDINGS: Alignment: Chronic but progressed reversal of the normal cervical lordosis since 2018, with stable levels of involvement especially C4-C5. Mild degenerative appearing anterolisthesis at that level has progressed while similar degenerative anterolisthesis of C7 on T1 is stable. Bilateral posterior element alignment is within normal limits. Skull base and vertebrae: Mild motion artifact, most pronounced at the C5 and C6 levels. Visualized skull base is intact. No atlanto-occipital dissociation. C1 and C2 appear intact and aligned. Degenerative appearing C6 vertebral body sclerosis, associated endplate irregularity is new since 2018. No acute osseous abnormality identified. Soft tissues and spinal canal: No prevertebral fluid or swelling. No visible canal hematoma.  Mild motion artifact. Negative visible noncontrast neck soft tissues. Disc levels: Chronic but progressed cervical spine degeneration since 2018, including bilateral facet arthropathy, degenerative appearing anterolisthesis C4-C5 and C7-T1. Advanced chronic disc and endplate degeneration at C5-C6 and C6-C7 has progressed. Less pronounced C4-C5 disc degeneration but new vacuum disc there. Up to mild associated spinal stenosis. Upper chest: Visible upper thoracic levels appear stable since 2018, mild chronic T3 compression fracture.  Chronic lung disease partially visible, increased apical scarring and architectural distortion since 2018. Additional sub solid peribronchial opacity in the lung apices is new (series 4, image 105 on the right). IMPRESSION: 1. Mild motion artifact, no acute traumatic injury identified in the cervical spine. 2. 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 3. Chronic apical lung disease, but superimposed peribronchial sub solid opacity raising the possibility of acute infection/bronchopneumonia. Electronically Signed   By: Marlise Simpers M.D.   On: 05/06/2024 10:24   CT Head Wo Contrast Result Date: 05/06/2024 CLINICAL DATA:  48 year old male with altered mental status, nausea, headache. EXAM: CT HEAD WITHOUT CONTRAST TECHNIQUE: Contiguous axial images were obtained from the base of the skull through the vertex without intravenous contrast. RADIATION DOSE REDUCTION: This exam was performed according to the departmental dose-optimization program which includes automated exposure control, adjustment of the mA and/or kV according to patient size and/or use of iterative reconstruction technique. COMPARISON:  Head CT 05/02/2017. FINDINGS: Brain: Stable cerebral volume. No midline shift, ventriculomegaly, mass effect, evidence of mass lesion, intracranial hemorrhage or evidence of cortically based acute infarction. Gray-white matter differentiation is within normal limits throughout the brain. Vascular: No suspicious intracranial vascular hyperdensity. Skull: Appears stable and intact. Mild motion artifact at the skull base today. Sinuses/Orbits: Visualized paranasal sinuses and mastoids are stable and well aerated. Other: Visualized orbits and scalp soft tissues are within normal limits. IMPRESSION: Stable and negative noncontrast Head CT. Electronically Signed   By: Marlise Simpers M.D.   On: 05/06/2024 10:20    Microbiology: Results for orders placed or performed during the hospital  encounter of 05/06/24  Resp panel by RT-PCR (RSV, Flu A&B, Covid) Anterior Nasal Swab     Status: None   Collection Time: 05/06/24  9:49 AM   Specimen: Anterior Nasal Swab  Result Value Ref Range Status   SARS Coronavirus 2 by RT PCR NEGATIVE NEGATIVE Final    Comment: (NOTE) SARS-CoV-2 target nucleic acids are NOT DETECTED.  The SARS-CoV-2 RNA is generally detectable in upper respiratory specimens during the acute phase of infection. The lowest concentration of SARS-CoV-2 viral copies this assay can detect is 138 copies/mL. A negative result does not preclude SARS-Cov-2 infection and should not be used as the sole basis for treatment or other patient management decisions. A negative result may occur with  improper specimen collection/handling, submission of specimen other than nasopharyngeal swab, presence of viral mutation(s) within the areas targeted by this assay, and inadequate number of viral copies(<138 copies/mL). A negative result must be combined with clinical observations, patient history, and epidemiological information. The expected result is Negative.  Fact Sheet for Patients:  BloggerCourse.com  Fact Sheet for Healthcare Providers:  SeriousBroker.it  This test is no t yet approved or cleared by the [place]  FDA and  has been authorized for detection and/or diagnosis of SARS-CoV-2 by FDA under an Emergency Use Authorization (EUA). This EUA will remain  in effect (meaning this test can be used) for the duration of the COVID-19 declaration under Section 564(b)(1) of the  Act, 21 U.S.C.section 360bbb-3(b)(1), unless the authorization is terminated  or revoked sooner.       Influenza A by PCR NEGATIVE NEGATIVE Final   Influenza B by PCR NEGATIVE NEGATIVE Final    Comment: (NOTE) The Xpert Xpress SARS-CoV-2/FLU/RSV plus assay is intended as an aid in the diagnosis of influenza from Nasopharyngeal swab specimens  and should not be used as a sole basis for treatment. Nasal washings and aspirates are unacceptable for Xpert Xpress SARS-CoV-2/FLU/RSV testing.  Fact Sheet for Patients: BloggerCourse.com  Fact Sheet for Healthcare Providers: SeriousBroker.it  This test is not yet approved or cleared by the [place]  FDA and has been authorized for detection and/or diagnosis of SARS-CoV-2 by FDA under an Emergency Use Authorization (EUA). This EUA will remain in effect (meaning this test can be used) for the duration of the COVID-19 declaration under Section 564(b)(1) of the Act, 21 U.S.C. section 360bbb-3(b)(1), unless the authorization is terminated or revoked.     Resp Syncytial Virus by PCR NEGATIVE NEGATIVE Final    Comment: (NOTE) Fact Sheet for Patients: BloggerCourse.com  Fact Sheet for Healthcare Providers: SeriousBroker.it  This test is not yet approved or cleared by the [place]  FDA and has been authorized for detection and/or diagnosis of SARS-CoV-2 by FDA under an Emergency Use Authorization (EUA). This EUA will remain in effect (meaning this test can be used) for the duration of the COVID-19 declaration under Section 564(b)(1) of the Act, 21 U.S.C. section 360bbb-3(b)(1), unless the authorization is terminated or revoked.  Performed at [facility], [address] [place], [place] [address]   Blood culture (routine x 2)     Status: None (Preliminary result)   Collection Time: 05/06/24  9:49 AM   Specimen: BLOOD LEFT FOREARM  Result Value Ref Range Status   Specimen Description BLOOD LEFT FOREARM  Final   Special Requests   Final    BOTTLES DRAWN AEROBIC AND ANAEROBIC Blood Culture results may not be optimal due to an inadequate volume of blood received in culture bottles   Culture   Final    NO GROWTH 2 DAYS Performed at [facility], [address] [place], [place] [address]    Report Status PENDING  Incomplete  Culture, blood (routine x 2) Call MD if unable to obtain prior to antibiotics being given     Status: None (Preliminary result)   Collection Time: 05/06/24 10:32 PM   Specimen: BLOOD  Result Value Ref Range Status   Specimen Description BLOOD BLOOD LEFT ARM  Final   Special Requests   Final    BOTTLES DRAWN AEROBIC AND ANAEROBIC Blood Culture adequate volume   Culture   Final    NO GROWTH 2 DAYS Performed at [facility], [address], [place], [place] [address]    Report Status PENDING  Incomplete  Culture, blood (routine x 2) Call MD if unable to obtain prior to antibiotics being given     Status: None (Preliminary result)   Collection Time: 05/06/24 10:32 PM   Specimen: BLOOD  Result Value Ref Range Status   Specimen Description BLOOD BLOOD RIGHT ARM  Final   Special Requests   Final    BOTTLES DRAWN AEROBIC ONLY Blood Culture adequate volume   Culture   Final    NO GROWTH 2 DAYS Performed at [facility], [address] [place], [place] [address]    Report Status PENDING  Incomplete  Blood culture (routine x 2)  Status: None (Preliminary result)   Collection Time: 05/07/24  9:44 AM   Specimen: BLOOD RIGHT ARM  Result Value Ref Range Status   Specimen Description BLOOD RIGHT ARM  Final   Special Requests   Final    BOTTLES DRAWN AEROBIC AND ANAEROBIC Blood Culture adequate volume   Culture   Final    NO GROWTH < 24 HOURS Performed at [facility], [address], [place], [place] [address]    Report Status PENDING  Incomplete  MRSA Next Gen by PCR, Nasal     Status: Abnormal   Collection Time: 05/07/24  5:12 PM   Specimen: Nasal Mucosa; Nasal Swab  Result Value Ref Range Status   MRSA by PCR Next Gen DETECTED (A) NOT DETECTED Final    Comment: RESULT CALLED TO, READ BACK BY AND VERIFIED WITH: YASMIN SORIANO @1952  ON 05/07/24 SKL (NOTE) The GeneXpert MRSA  Assay (FDA approved for NASAL specimens only), is one component of a comprehensive MRSA colonization surveillance program. It is not intended to diagnose MRSA infection nor to guide or monitor treatment for MRSA infections. Test performance is not FDA approved in patients less than 54 years old. Performed at [facility], [address] [place], [place] [address]     Labs: CBC: Recent Labs  Lab 05/06/24 0949 05/07/24 0522 05/08/24 0533  WBC 5.1 10.7* 8.8  NEUTROABS 4.4  --   --   HGB 13.4 11.2* 12.1*  HCT 40.1 32.9* 36.1*  MCV 89.9 90.1 91.4  PLT 145* 153 143*   Basic Metabolic Panel: Recent Labs  Lab 05/06/24 0949 05/07/24 0522 05/08/24 0533  NA 133* 139 140  K 3.3* 3.8 4.5  CL 98 103 101  CO2 26 30 31   GLUCOSE 113* 95 102*  BUN 9 14 17   CREATININE 0.73 0.60* 0.62  CALCIUM 8.2* 8.2* 8.6*   Liver Function Tests: Recent Labs  Lab 05/06/24 0949  AST 26  ALT 14  ALKPHOS 55  BILITOT 0.7  PROT 6.4*  ALBUMIN 3.6   CBG: Recent Labs  Lab 05/06/24 0958 05/06/24 1816 05/06/24 2139 05/07/24 0235  GLUCAP 117* 119* 61* 147*    Discharge time spent:  37 minutes.  Signed: Ezzard Holms, MD [facility] 05/08/2024

## 2024-05-08 NOTE — [place] FL2 (Signed)
 [place]  MEDICAID FL2 LEVEL OF CARE FORM     IDENTIFICATION  Patient Name: [name] Birthdate: 1976-06-15 Sex: male Admission Date (Current Location): 05/06/2024  [facility] and [place] Number:  Kaylene Pascal [id number] R Facility and Address:  [facility], [address], [place], [place] [address]      Provider Number: [id number]  Attending Physician Name and Address:  Ezzard Holms, MD  Relative Name and Phone Number:  Legal Guardian Digna Fraise) [phone]    Current Level of Care: Hospital Recommended Level of Care: Family Care Home (Group Home) Prior Approval Number:    Date Approved/Denied:   PASRR Number:    Discharge Plan: Other (Comment) (Group Home)    Current Diagnoses: Patient Active Problem List   Diagnosis Date Noted   Pneumonia 05/06/2024   Lymphedema 03/12/2023   Diarrhea 10/31/2022   Dysphagia 03/24/2022   Chronic diastolic HF (heart failure) (HCC) 09/14/2021   Closed fracture of right proximal tibia 11/05/2019   Closed displaced bicondylar fracture of right tibia 12/03/2018   Cellulitis of left lower extremity    Cellulitis of left foot 03/07/2018   COPD (chronic obstructive pulmonary disease) (HCC) 05/03/2017   Hyponatremia 06/27/2016   Gastroesophageal reflux disease 11/11/2015   Borderline personality disorder (HCC) 11/11/2015   Alcohol  use disorder, severe, in controlled environment (HCC) 11/11/2015   MDD (major depressive disorder), recurrent severe, 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   Tobacco abuse 11/05/2013    Orientation RESPIRATION BLADDER Height & Weight     Self, Time  Normal Continent Weight: 69.4 kg Height:  5' 11 (180.3 cm)  BEHAVIORAL SYMPTOMS/MOOD NEUROLOGICAL BOWEL NUTRITION STATUS      Continent Diet (Cardiac diet)  AMBULATORY STATUS COMMUNICATION OF NEEDS Skin   Independent Verbally Normal                       Personal  Care Assistance Level of Assistance              Functional Limitations Info             SPECIAL CARE FACTORS FREQUENCY                       Contractures Contractures Info: Not present    Additional Factors Info  Code Status, Allergies Code Status Info: Full Allergies Info: Codeine, Ibuprofen , Morphine , Morphine  And Codeine, Tramadol , Penicillins           Current Medications (05/08/2024):  This is the current hospital active medication list Current Facility-Administered Medications  Medication Dose Route Frequency Provider Last Rate Last Admin   acetaminophen  (TYLENOL ) tablet 650 mg  650 mg Oral Q6H PRN Paudel, Keshab, MD   650 mg at 05/08/24 7846   Or   acetaminophen  (TYLENOL ) suppository 650 mg  650 mg Rectal Q6H PRN Beatris Bough, MD       albuterol  (PROVENTIL ) (2.5 MG/3ML) 0.083% nebulizer solution 3 mL  3 mL Inhalation QID PRN Beatris Bough, MD       azithromycin  (ZITHROMAX ) 500 mg in sodium chloride  0.9 % 250 mL IVPB  500 mg Intravenous Q24H Paudel, Halford Levels, MD   Stopped at 05/07/24 1334   buprenorphine-naloxone  (SUBOXONE) 8-2 mg per SL tablet 0.5 tablet  0.5 tablet Sublingual QID Darus Engels A, DO   0.5 tablet at 05/08/24 0824   carbamazepine  (TEGRETOL  XR) 12 hr tablet 100 mg  100 mg Oral BID Paudel, Keshab,  MD   100 mg at 05/08/24 0823   cefTRIAXone  (ROCEPHIN ) 2 g in sodium chloride  0.9 % 100 mL IVPB  2 g Intravenous Q24H Paudel, Keshab, MD   Stopped at 05/07/24 1449   enoxaparin  (LOVENOX ) injection 40 mg  40 mg Subcutaneous Q24H Paudel, Halford Levels, MD   40 mg at 05/07/24 2125   escitalopram  (LEXAPRO ) tablet 20 mg  20 mg Oral Daily Paudel, Halford Levels, MD   20 mg at 05/08/24 3875   fluticasone  furoate-vilanterol (BREO ELLIPTA ) 200-25 MCG/ACT 1 puff  1 puff Inhalation Daily Paudel, Keshab, MD   1 puff at 05/08/24 6433   gabapentin  (NEURONTIN ) capsule 300 mg  300 mg Oral TID Darus Engels A, DO   300 mg at 05/08/24 2951   HYDROcodone -acetaminophen  (NORCO/VICODIN)  5-325 MG per tablet 1 tablet  1 tablet Oral Q6H PRN Darus Engels A, DO   1 tablet at 05/08/24 1337   hydrOXYzine  (ATARAX ) tablet 50 mg  50 mg Oral Q6H PRN Darus Engels A, DO   50 mg at 05/08/24 0823   ipratropium-albuterol  (DUONEB) 0.5-2.5 (3) MG/3ML nebulizer solution 3 mL  3 mL Nebulization TID Darus Engels A, DO   3 mL at 05/08/24 0719   loratadine  (CLARITIN ) tablet 10 mg  10 mg Oral Daily Darus Engels A, DO   10 mg at 05/08/24 8841   losartan  (COZAAR ) tablet 25 mg  25 mg Oral Daily Paudel, Keshab, MD   25 mg at 05/08/24 6606   methocarbamol  (ROBAXIN ) tablet 750 mg  750 mg Oral Q8H PRN Darus Engels A, DO   750 mg at 05/08/24 0730   mirtazapine (REMERON) tablet 15 mg  15 mg Oral QHS Darus Engels A, DO   15 mg at 05/07/24 2120   nicotine  (NICODERM CQ  - dosed in mg/24 hours) patch 21 mg  21 mg Transdermal Daily Niu, Xilin, MD   21 mg at 05/08/24 3016   ondansetron  (ZOFRAN ) tablet 4 mg  4 mg Oral Q6H PRN Paudel, Keshab, MD   4 mg at 05/07/24 2122   Or   ondansetron  (ZOFRAN ) injection 4 mg  4 mg Intravenous Q6H PRN Beatris Bough, MD       pantoprazole  (PROTONIX ) EC tablet 40 mg  40 mg Oral Daily Darus Engels A, DO   40 mg at 05/08/24 0109   polyethylene glycol (MIRALAX  / GLYCOLAX ) packet 17 g  17 g Oral Daily PRN Paudel, Keshab, MD       primidone  (MYSOLINE ) tablet 250 mg  250 mg Oral TID Darus Engels A, DO   250 mg at 05/08/24 3235   primidone  (MYSOLINE ) tablet 50 mg  50 mg Oral TID Darus Engels A, DO   50 mg at 05/08/24 5732   QUEtiapine  (SEROQUEL ) tablet 50 mg  50 mg Oral QHS Darus Engels A, DO   50 mg at 05/07/24 2120     Discharge Medications: STOP taking these medications     pantoprazole  40 MG tablet Commonly known as: PROTONIX            TAKE these medications     albuterol  108 (90 Base) MCG/ACT inhaler Commonly known as: VENTOLIN  HFA Inhale 1 puff into the lungs 4 (four) times daily as needed for wheezing or shortness of breath.     amoxicillin -clavulanate 875-125 MG tablet Commonly known as: AUGMENTIN  Take 1 tablet by mouth 2 (two) times daily for 5 days.    buprenorphine-naloxone  8-2 mg Subl SL tablet Commonly known as: SUBOXONE Place 0.5 tablets under the tongue  4 (four) times daily.    carbamazepine  100 MG 12 hr tablet Commonly known as: TEGRETOL  XR Take 100 mg by mouth 2 (two) times daily.    cetirizine 10 MG tablet Commonly known as: ZYRTEC Take 10 mg by mouth daily.    ergocalciferol  1.25 MG (50000 UT) capsule Commonly known as: VITAMIN D2 Take 50,000 Units by mouth once a week.    escitalopram  20 MG tablet Commonly known as: LEXAPRO  Take 20 mg by mouth daily.    fluticasone -salmeterol 250-50 MCG/ACT Aepb Commonly known as: ADVAIR Inhale 1 puff into the lungs in the morning and at bedtime.    folic acid  1 MG tablet Commonly known as: FOLVITE  Take 1 mg by mouth daily.    furosemide  20 MG tablet Commonly known as: LASIX  Take 1 tablet (20 mg total) by mouth daily.    gabapentin  300 MG capsule Commonly known as: NEURONTIN  Take 300 mg by mouth 3 (three) times daily.    guaiFENesin  600 MG 12 hr tablet Commonly known as: MUCINEX  Take 600 mg by mouth 2 (two) times daily. For sinus headache    hydrOXYzine  50 MG tablet Commonly known as: ATARAX  Take 50 mg by mouth every 6 (six) hours as needed for anxiety.    loperamide  2 MG capsule Commonly known as: IMODIUM  Take 2 mg by mouth as needed for diarrhea or loose stools.    losartan  25 MG tablet Commonly known as: COZAAR  Take 25 mg by mouth daily.    methocarbamol  750 MG tablet Commonly known as: ROBAXIN  Take 750 mg by mouth 3 (three) times daily.    mirtazapine 15 MG tablet Commonly known as: REMERON Take 15 mg by mouth at bedtime.    omeprazole 40 MG capsule Commonly known as: PRILOSEC Take 40 mg by mouth daily.    primidone  250 MG tablet Commonly known as: MYSOLINE  Take 250 mg by mouth 3 (three) times daily.    primidone  50  MG tablet Commonly known as: MYSOLINE  Take 50 mg by mouth 3 (three) times daily.    QUEtiapine  25 MG tablet Commonly known as: SEROQUEL  Take 50 mg by mouth at bedtime.    Repatha SureClick 140 MG/ML Soaj Generic drug: Evolocumab Inject 140 mg into the skin every 14 (fourteen) days.    rosuvastatin 40 MG tablet Commonly known as: CRESTOR Take 40 mg by mouth daily.     Relevant Imaging Results:  Relevant Lab Results:   Additional Information SSN: [id number]  Zoe Hinds, RN

## 2024-05-08 NOTE — TOC Transition Note (Addendum)
 Transition of Care [facility]) - Discharge Note   Patient Details  Name: [name] MRN: [id number] Date of Birth: 1975-11-26  [facility] CM/SW Contact:  Zoe Hinds, RN Phone Number: 05/08/2024, 3:18 PM   Clinical Narrative:    This CM updated by covering MD pt medically cleared to dc today with active DC order . This CM spoke with [name] DSS Guardian Dept Supervisor and informed that pt was medically cleared to dc today back to group home . [name] confirmed that pt is a resident an [facility] informed Public relations account executive is [name] @[phone].  This CM provided  legal guardian info as [name] [phone]. This CM updated medically team Guardian was contacted and updated of pt's dc order.   Pt's legal guardian [name] Arrived beside to provide DC transportation for pt . This CM provided  updated FL2 & dc summary in  dc packet to Guardian . Medical team updated . No additional DC needs requested by medical team or identified by CM at this time .    Final next level of care: Group Home ([facility] [address]) Barriers to Discharge: No Barriers Identified    Discharge Placement                Patient to be transferred to facility by: Legal Guardian [name] Name of family member notified: Legal Guardian [name] Patient and family notified of of transfer: 05/08/24     Social Drivers of Health (SDOH) Interventions SDOH Screenings   Food Insecurity: Patient Declined (05/07/2024)  Housing: Unknown (05/07/2024)  Transportation Needs: Patient Declined (05/07/2024)  Utilities: Patient Declined (05/07/2024)  Social Connections: Patient Declined (05/07/2024)  Tobacco Use: High Risk (05/07/2024)     Readmission Risk Interventions     No data to display

## 2024-05-11 LAB — CULTURE, BLOOD (ROUTINE X 2)
Culture: NO GROWTH
Culture: NO GROWTH
Culture: NO GROWTH
Special Requests: ADEQUATE
Special Requests: ADEQUATE

## 2024-05-12 LAB — CULTURE, BLOOD (ROUTINE X 2)
Culture: NO GROWTH
Special Requests: ADEQUATE

## 2024-05-20 ENCOUNTER — Inpatient Hospital Stay
Admission: EM | Admit: 2024-05-20 | Discharge: 2024-05-23 | DRG: 372 | Disposition: A | Discharge status: Home Health | Attending: Student | Admitting: Student

## 2024-05-20 ENCOUNTER — Emergency Department

## 2024-05-20 ENCOUNTER — Other Ambulatory Visit: Payer: Self-pay

## 2024-05-20 DIAGNOSIS — E876 Hypokalemia: Secondary | ICD-10-CM | POA: Diagnosis present

## 2024-05-20 DIAGNOSIS — Z888 Allergy status to other drugs, medicaments and biological substances status: Secondary | ICD-10-CM

## 2024-05-20 DIAGNOSIS — Z885 Allergy status to narcotic agent status: Secondary | ICD-10-CM | POA: Diagnosis not present

## 2024-05-20 DIAGNOSIS — Z886 Allergy status to analgesic agent status: Secondary | ICD-10-CM | POA: Diagnosis not present

## 2024-05-20 DIAGNOSIS — I10 Essential (primary) hypertension: Secondary | ICD-10-CM | POA: Diagnosis present

## 2024-05-20 DIAGNOSIS — Z7951 Long term (current) use of inhaled steroids: Secondary | ICD-10-CM

## 2024-05-20 DIAGNOSIS — G40909 Epilepsy, unspecified, not intractable, without status epilepticus: Secondary | ICD-10-CM | POA: Diagnosis present

## 2024-05-20 DIAGNOSIS — Z79899 Other long term (current) drug therapy: Secondary | ICD-10-CM | POA: Diagnosis not present

## 2024-05-20 DIAGNOSIS — E871 Hypo-osmolality and hyponatremia: Secondary | ICD-10-CM | POA: Diagnosis present

## 2024-05-20 DIAGNOSIS — F32A Depression, unspecified: Secondary | ICD-10-CM | POA: Diagnosis present

## 2024-05-20 DIAGNOSIS — J4489 Other specified chronic obstructive pulmonary disease: Secondary | ICD-10-CM | POA: Diagnosis present

## 2024-05-20 DIAGNOSIS — F17210 Nicotine dependence, cigarettes, uncomplicated: Secondary | ICD-10-CM | POA: Diagnosis present

## 2024-05-20 DIAGNOSIS — Z833 Family history of diabetes mellitus: Secondary | ICD-10-CM

## 2024-05-20 DIAGNOSIS — Z8701 Personal history of pneumonia (recurrent): Secondary | ICD-10-CM

## 2024-05-20 DIAGNOSIS — F419 Anxiety disorder, unspecified: Secondary | ICD-10-CM | POA: Diagnosis present

## 2024-05-20 DIAGNOSIS — J4520 Mild intermittent asthma, uncomplicated: Secondary | ICD-10-CM

## 2024-05-20 DIAGNOSIS — K529 Noninfective gastroenteritis and colitis, unspecified: Secondary | ICD-10-CM | POA: Diagnosis present

## 2024-05-20 DIAGNOSIS — Z8249 Family history of ischemic heart disease and other diseases of the circulatory system: Secondary | ICD-10-CM | POA: Diagnosis not present

## 2024-05-20 DIAGNOSIS — Z8042 Family history of malignant neoplasm of prostate: Secondary | ICD-10-CM

## 2024-05-20 DIAGNOSIS — E878 Other disorders of electrolyte and fluid balance, not elsewhere classified: Secondary | ICD-10-CM | POA: Diagnosis present

## 2024-05-20 DIAGNOSIS — Z880 Allergy status to penicillin: Secondary | ICD-10-CM | POA: Diagnosis not present

## 2024-05-20 DIAGNOSIS — A0472 Enterocolitis due to Clostridium difficile, not specified as recurrent: Principal | ICD-10-CM | POA: Diagnosis present

## 2024-05-20 DIAGNOSIS — E785 Hyperlipidemia, unspecified: Secondary | ICD-10-CM | POA: Diagnosis present

## 2024-05-20 DIAGNOSIS — J45909 Unspecified asthma, uncomplicated: Secondary | ICD-10-CM | POA: Insufficient documentation

## 2024-05-20 DIAGNOSIS — K5100 Ulcerative (chronic) pancolitis without complications: Secondary | ICD-10-CM | POA: Diagnosis not present

## 2024-05-20 DIAGNOSIS — K219 Gastro-esophageal reflux disease without esophagitis: Secondary | ICD-10-CM | POA: Diagnosis present

## 2024-05-20 LAB — URINALYSIS, ROUTINE W REFLEX MICROSCOPIC
Bilirubin Urine: NEGATIVE
Glucose, UA: NEGATIVE mg/dL
Hgb urine dipstick: NEGATIVE
Ketones, ur: NEGATIVE mg/dL
Leukocytes,Ua: NEGATIVE
Nitrite: NEGATIVE
Protein, ur: NEGATIVE mg/dL
Specific Gravity, Urine: 1.039 — ABNORMAL HIGH (ref 1.005–1.030)
pH: 6 (ref 5.0–8.0)

## 2024-05-20 LAB — CBC
HCT: 41.1 % (ref 39.0–52.0)
Hemoglobin: 14.2 g/dL (ref 13.0–17.0)
MCH: 30.8 pg (ref 26.0–34.0)
MCHC: 34.5 g/dL (ref 30.0–36.0)
MCV: 89.2 fL (ref 80.0–100.0)
Platelets: 199 10*3/uL (ref 150–400)
RBC: 4.61 MIL/uL (ref 4.22–5.81)
RDW: 13.2 % (ref 11.5–15.5)
WBC: 11.5 10*3/uL — ABNORMAL HIGH (ref 4.0–10.5)
nRBC: 0 % (ref 0.0–0.2)

## 2024-05-20 LAB — COMPREHENSIVE METABOLIC PANEL WITH GFR
ALT: 15 U/L (ref 0–44)
AST: 15 U/L (ref 15–41)
Albumin: 3.9 g/dL (ref 3.5–5.0)
Alkaline Phosphatase: 85 U/L (ref 38–126)
Anion gap: 11 (ref 5–15)
BUN: 10 mg/dL (ref 6–20)
CO2: 26 mmol/L (ref 22–32)
Calcium: 8.7 mg/dL — ABNORMAL LOW (ref 8.9–10.3)
Chloride: 94 mmol/L — ABNORMAL LOW (ref 98–111)
Creatinine, Ser: 0.66 mg/dL (ref 0.61–1.24)
GFR, Estimated: >60 mL/min (ref >60)
Glucose, Bld: 107 mg/dL — ABNORMAL HIGH (ref 70–99)
Potassium: 3.5 mmol/L (ref 3.5–5.1)
Sodium: 131 mmol/L — ABNORMAL LOW (ref 135–145)
Total Bilirubin: 1 mg/dL (ref 0.0–1.2)
Total Protein: 7.6 g/dL (ref 6.5–8.1)

## 2024-05-20 LAB — LACTIC ACID, PLASMA: Lactic Acid, Venous: 1.2 mmol/L (ref 0.5–1.9)

## 2024-05-20 LAB — LIPASE, BLOOD: Lipase: 20 U/L (ref 11–51)

## 2024-05-20 MED ORDER — ESCITALOPRAM OXALATE 10 MG PO TABS
20.0000 mg | ORAL_TABLET | Freq: Every day | ORAL | Status: DC
  Administered 2024-05-21 – 2024-05-23 (×3): 20 mg via ORAL
  Filled 2024-05-20 (×3): qty 2

## 2024-05-20 MED ORDER — PRIMIDONE 250 MG PO TABS
250.0000 mg | ORAL_TABLET | Freq: Three times a day (TID) | ORAL | Status: DC
  Administered 2024-05-20 – 2024-05-23 (×8): 250 mg via ORAL
  Filled 2024-05-20 (×10): qty 1

## 2024-05-20 MED ORDER — FENTANYL CITRATE PF 50 MCG/ML IJ SOSY
100.0000 ug | PREFILLED_SYRINGE | Freq: Once | INTRAMUSCULAR | Status: AC
  Administered 2024-05-20: 100 ug via INTRAVENOUS
  Filled 2024-05-20: qty 2

## 2024-05-20 MED ORDER — PRIMIDONE 50 MG PO TABS
50.0000 mg | ORAL_TABLET | Freq: Three times a day (TID) | ORAL | Status: DC
  Administered 2024-05-20 – 2024-05-23 (×8): 50 mg via ORAL
  Filled 2024-05-20 (×10): qty 1

## 2024-05-20 MED ORDER — MIRTAZAPINE 15 MG PO TABS
15.0000 mg | ORAL_TABLET | Freq: Every day | ORAL | Status: DC
  Administered 2024-05-20 – 2024-05-22 (×3): 15 mg via ORAL
  Filled 2024-05-20 (×3): qty 1

## 2024-05-20 MED ORDER — SODIUM CHLORIDE 0.9 % IV BOLUS
1000.0000 mL | Freq: Once | INTRAVENOUS | Status: AC
  Administered 2024-05-20: 1000 mL via INTRAVENOUS

## 2024-05-20 MED ORDER — CIPROFLOXACIN IN D5W 400 MG/200ML IV SOLN
400.0000 mg | Freq: Once | INTRAVENOUS | Status: AC
  Administered 2024-05-20: 400 mg via INTRAVENOUS
  Filled 2024-05-20: qty 200

## 2024-05-20 MED ORDER — VITAMIN D (ERGOCALCIFEROL) 1.25 MG (50000 UNIT) PO CAPS: 50000.0000 [IU] | ORAL_CAPSULE | ORAL | Status: DC

## 2024-05-20 MED ORDER — ALBUTEROL SULFATE (2.5 MG/3ML) 0.083% IN NEBU: 3.0000 mL | INHALATION_SOLUTION | Freq: Four times a day (QID) | RESPIRATORY_TRACT | Status: DC | PRN

## 2024-05-20 MED ORDER — FOLIC ACID 1 MG PO TABS
1.0000 mg | ORAL_TABLET | Freq: Every day | ORAL | Status: DC
  Administered 2024-05-21 – 2024-05-23 (×3): 1 mg via ORAL
  Filled 2024-05-20 (×3): qty 1

## 2024-05-20 MED ORDER — ENOXAPARIN SODIUM 40 MG/0.4ML IJ SOSY
40.0000 mg | PREFILLED_SYRINGE | INTRAMUSCULAR | Status: DC
  Administered 2024-05-20 – 2024-05-22 (×3): 40 mg via SUBCUTANEOUS
  Filled 2024-05-20 (×3): qty 0.4

## 2024-05-20 MED ORDER — BUPRENORPHINE HCL-NALOXONE HCL 8-2 MG SL SUBL
0.5000 | SUBLINGUAL_TABLET | Freq: Four times a day (QID) | SUBLINGUAL | Status: DC
  Administered 2024-05-21 – 2024-05-23 (×5): 0.5 via SUBLINGUAL
  Filled 2024-05-20 (×7): qty 1

## 2024-05-20 MED ORDER — CIPROFLOXACIN IN D5W 400 MG/200ML IV SOLN
400.0000 mg | Freq: Two times a day (BID) | INTRAVENOUS | Status: DC
  Administered 2024-05-21 – 2024-05-22 (×3): 400 mg via INTRAVENOUS
  Filled 2024-05-20 (×3): qty 200

## 2024-05-20 MED ORDER — FUROSEMIDE 20 MG PO TABS
20.0000 mg | ORAL_TABLET | Freq: Every day | ORAL | Status: DC
  Administered 2024-05-21 – 2024-05-22 (×2): 20 mg via ORAL
  Filled 2024-05-20 (×2): qty 1

## 2024-05-20 MED ORDER — QUETIAPINE FUMARATE 25 MG PO TABS
50.0000 mg | ORAL_TABLET | Freq: Every day | ORAL | Status: DC
  Administered 2024-05-20 – 2024-05-22 (×3): 50 mg via ORAL
  Filled 2024-05-20 (×3): qty 2

## 2024-05-20 MED ORDER — ROSUVASTATIN CALCIUM 10 MG PO TABS
40.0000 mg | ORAL_TABLET | Freq: Every day | ORAL | Status: DC
  Administered 2024-05-21 – 2024-05-23 (×3): 40 mg via ORAL
  Filled 2024-05-20 (×3): qty 4

## 2024-05-20 MED ORDER — METHOCARBAMOL 500 MG PO TABS
750.0000 mg | ORAL_TABLET | Freq: Three times a day (TID) | ORAL | Status: DC
  Administered 2024-05-20 – 2024-05-23 (×8): 750 mg via ORAL
  Filled 2024-05-20 (×8): qty 2

## 2024-05-20 MED ORDER — ACETAMINOPHEN 325 MG PO TABS
650.0000 mg | ORAL_TABLET | Freq: Once | ORAL | Status: AC | PRN
  Administered 2024-05-20: 650 mg via ORAL
  Filled 2024-05-20: qty 2

## 2024-05-20 MED ORDER — GUAIFENESIN ER 600 MG PO TB12
600.0000 mg | ORAL_TABLET | Freq: Two times a day (BID) | ORAL | Status: DC
  Administered 2024-05-20 – 2024-05-23 (×6): 600 mg via ORAL
  Filled 2024-05-20 (×6): qty 1

## 2024-05-20 MED ORDER — TRAZODONE HCL 50 MG PO TABS
25.0000 mg | ORAL_TABLET | Freq: Every evening | ORAL | Status: DC | PRN
Start: 2024-05-20 — End: 2024-05-23
  Administered 2024-05-20 – 2024-05-21 (×2): 25 mg via ORAL
  Filled 2024-05-20 (×2): qty 1

## 2024-05-20 MED ORDER — IOHEXOL 300 MG/ML  SOLN
80.0000 mL | Freq: Once | INTRAMUSCULAR | Status: AC | PRN
  Administered 2024-05-20: 80 mL via INTRAVENOUS

## 2024-05-20 MED ORDER — HYDROMORPHONE HCL 1 MG/ML IJ SOLN
1.0000 mg | Freq: Once | INTRAMUSCULAR | Status: AC
  Administered 2024-05-20: 1 mg via INTRAVENOUS
  Filled 2024-05-20: qty 1

## 2024-05-20 MED ORDER — ACETAMINOPHEN 325 MG PO TABS: 650.0000 mg | ORAL_TABLET | Freq: Four times a day (QID) | ORAL | Status: DC | PRN

## 2024-05-20 MED ORDER — POTASSIUM CHLORIDE IN NACL 20-0.9 MEQ/L-% IV SOLN
INTRAVENOUS | Status: AC
  Filled 2024-05-20 (×3): qty 1000

## 2024-05-20 MED ORDER — ACETAMINOPHEN 650 MG RE SUPP: 650.0000 mg | Freq: Four times a day (QID) | RECTAL | Status: DC | PRN

## 2024-05-20 MED ORDER — LORATADINE 10 MG PO TABS
10.0000 mg | ORAL_TABLET | Freq: Every day | ORAL | Status: DC
  Administered 2024-05-21 – 2024-05-23 (×3): 10 mg via ORAL
  Filled 2024-05-20 (×3): qty 1

## 2024-05-20 MED ORDER — FLUTICASONE FUROATE-VILANTEROL 200-25 MCG/ACT IN AEPB
1.0000 | INHALATION_SPRAY | Freq: Every day | RESPIRATORY_TRACT | Status: DC
  Administered 2024-05-21 – 2024-05-23 (×3): 1 via RESPIRATORY_TRACT
  Filled 2024-05-20: qty 28

## 2024-05-20 MED ORDER — METRONIDAZOLE 500 MG/100ML IV SOLN
500.0000 mg | Freq: Once | INTRAVENOUS | Status: AC
  Administered 2024-05-20: 500 mg via INTRAVENOUS
  Filled 2024-05-20: qty 100

## 2024-05-20 MED ORDER — HYDROXYZINE HCL 25 MG PO TABS
50.0000 mg | ORAL_TABLET | Freq: Four times a day (QID) | ORAL | Status: DC | PRN
  Administered 2024-05-21: 50 mg via ORAL
  Filled 2024-05-20: qty 2

## 2024-05-20 MED ORDER — LOSARTAN POTASSIUM 25 MG PO TABS
25.0000 mg | ORAL_TABLET | Freq: Every day | ORAL | Status: DC
  Administered 2024-05-21 – 2024-05-23 (×3): 25 mg via ORAL
  Filled 2024-05-20 (×3): qty 1

## 2024-05-20 MED ORDER — PANTOPRAZOLE SODIUM 40 MG PO TBEC: 40.0000 mg | DELAYED_RELEASE_TABLET | Freq: Every day | ORAL | Status: DC

## 2024-05-20 MED ORDER — GABAPENTIN 300 MG PO CAPS
300.0000 mg | ORAL_CAPSULE | Freq: Three times a day (TID) | ORAL | Status: DC
  Administered 2024-05-20 – 2024-05-23 (×8): 300 mg via ORAL
  Filled 2024-05-20 (×8): qty 1

## 2024-05-20 MED ORDER — ONDANSETRON HCL 4 MG/2ML IJ SOLN
4.0000 mg | Freq: Four times a day (QID) | INTRAMUSCULAR | Status: DC | PRN
  Administered 2024-05-20: 4 mg via INTRAVENOUS
  Filled 2024-05-20: qty 2

## 2024-05-20 MED ORDER — CARBAMAZEPINE ER 100 MG PO TB12
100.0000 mg | ORAL_TABLET | Freq: Two times a day (BID) | ORAL | Status: DC
  Administered 2024-05-20 – 2024-05-23 (×6): 100 mg via ORAL
  Filled 2024-05-20 (×7): qty 1

## 2024-05-20 MED ORDER — FENTANYL CITRATE PF 50 MCG/ML IJ SOSY
25.0000 ug | PREFILLED_SYRINGE | INTRAMUSCULAR | Status: DC | PRN
  Administered 2024-05-20 – 2024-05-21 (×3): 25 ug via INTRAVENOUS
  Filled 2024-05-20 (×3): qty 1

## 2024-05-20 MED ORDER — METRONIDAZOLE 500 MG/100ML IV SOLN
500.0000 mg | Freq: Two times a day (BID) | INTRAVENOUS | Status: DC
  Administered 2024-05-21 (×2): 500 mg via INTRAVENOUS
  Filled 2024-05-20 (×2): qty 100

## 2024-05-20 MED ORDER — ONDANSETRON HCL 4 MG PO TABS
4.0000 mg | ORAL_TABLET | Freq: Four times a day (QID) | ORAL | Status: DC | PRN
Start: 2024-05-20 — End: 2024-05-23

## 2024-05-20 MED ORDER — ONDANSETRON HCL 4 MG/2ML IJ SOLN
4.0000 mg | Freq: Once | INTRAMUSCULAR | Status: AC
  Administered 2024-05-20: 4 mg via INTRAVENOUS
  Filled 2024-05-20: qty 2

## 2024-05-20 NOTE — Assessment & Plan Note (Signed)
 Will continue statin therapy

## 2024-05-20 NOTE — H&P (Signed)
 [place]   PATIENT NAME: Chike Farrington    MR#:  [id number]  DATE OF BIRTH:  04-Jun-1976  DATE OF ADMISSION:  05/20/2024  PRIMARY CARE PHYSICIAN: Carlette Benita Area, MD   Patient is coming from: Group home  REQUESTING/REFERRING PHYSICIAN: Dorothyann Drivers, MD  CHIEF COMPLAINT:   Chief Complaint  Patient presents with   Abdominal Pain    HISTORY OF PRESENT ILLNESS:  Raphael Fitzpatrick is a 48 y.o. Caucasian male with medical history significant for anxiety, asthma, CHF, COPD, depression, GERD and hypertension, who presented to the emergency room with acute onset of left lower quadrant abdominal pain which has been going on over the last few days with associated fever today that was up to 102.  He admitted to diarrhea with watery bowel movements.  He denied any nausea or vomiting or constipation.  No dysuria, oliguria or hematuria or flank pain.  No chest pain or palpitations.  No cough or wheezing or dyspnea.  No melena or bright red bleeding per rectum.  No bleeding diathesis.  He is taking any recent antibiotics.  ED Course: When the patient came to the ER, temperature was 102.5 and otherwise vital signs were within normal.  Labs revealed hyponatremia 131 and hypochloremia of 94, with calcium of 8.7 otherwise unremarkable CMP.  CBC showed leukocytosis of 11.5 and lactic acid was 1.2.  UA came back with elevated specific gravity of 1039.  Blood culture were drawn. EKG as reviewed by me : None. Imaging: Abdominal and pelvic CT scan with contrast revealed the following: 1. Severe pancolitis with prominent submucosal edema. Differential would include infectious colitis, drug induced colitis (including pseudomembranous colitis) or inflammatory bowel disease. No evidence of inflammatory bowel disease on comparison CT. 2. No vascular abnormality.  The patient was given 1 L bolus of IV normal saline, 4 mg of IV Zofran , 1 mg of IV Dilaudid  and 100 mcg of IV fentanyl , 400 mg of IV  Cipro  and 500 mg of IV Flagyl .  The patient will be admitted to a medical telemetry bed for further evaluation and management. PAST MEDICAL HISTORY:   Past Medical History:  Diagnosis Date   Anxiety    Arthritis    knees and fingers   Asthma    CHF (congestive heart failure) (HCC)    COPD (chronic obstructive pulmonary disease) (HCC)    Depression    Encephalopathy    GERD (gastroesophageal reflux disease)    Hypertension    Hyponatremia 06/2016   Pneumonia 2017   Renal disorder    kidney injury    PAST SURGICAL HISTORY:   Past Surgical History:  Procedure Laterality Date   BIOPSY  05/10/2022   Procedure: BIOPSY;  Surgeon: Eartha Angelia Sieving, MD;  Location: [facility];  Service: Gastroenterology;;   COLONOSCOPY     ESOPHAGOGASTRODUODENOSCOPY     ESOPHAGOGASTRODUODENOSCOPY (EGD) WITH PROPOFOL  N/A 05/10/2022   Procedure: ESOPHAGOGASTRODUODENOSCOPY (EGD) WITH PROPOFOL ;  Surgeon: Eartha Angelia Sieving, MD;  Location: [facility];  Service: Gastroenterology;  Laterality: N/A;  205   EXTERNAL FIXATION LEG Right 12/01/2018   Procedure: EXTERNAL FIXATION LEG;  Surgeon: Marchia Drivers, MD;  Location: [facility];  Service: Orthopedics;  Laterality: Right;   EXTERNAL FIXATION REMOVAL Right 12/06/2018   Procedure: REMOVAL EXTERNAL FIXATION LEG;  Surgeon: Kendal Drivers SQUIBB, MD;  Location: [facility];  Service: Orthopedics;  Laterality: Right;   FINGER SURGERY Left    5th digit-Fracture-worse now than before   HARDWARE REMOVAL Right  12/06/2019   Procedure: HARDWARE REMOVAL RIGHT TIBIAL PLATEAU;  Surgeon: Kendal Franky SQUIBB, MD;  Location: [facility];  Service: Orthopedics;  Laterality: Right;   ORIF TIBIA PLATEAU Right 12/06/2018   Procedure: OPEN REDUCTION INTERNAL FIXATION (ORIF) TIBIAL PLATEAU;  Surgeon: Kendal Franky SQUIBB, MD;  Location: [facility];  Service: Orthopedics;  Laterality: Right;   PLEURAL EFFUSION DRAINAGE Right 01/02/2016   Procedure: DRAINAGE OF PLEURAL EFFUSION;  Surgeon: Elspeth JAYSON Millers, MD;  Location: [facility];  Service: Thoracic;  Laterality: Right;   VIDEO ASSISTED THORACOSCOPY (VATS)/DECORTICATION Right 01/02/2016   Procedure: VIDEO ASSISTED THORACOSCOPY (VATS)/DECORTICATION;  Surgeon: Elspeth JAYSON Millers, MD;  Location: [facility];  Service: Thoracic;  Laterality: Right;   VIDEO BRONCHOSCOPY N/A 01/02/2016   Procedure: VIDEO BRONCHOSCOPY;  Surgeon: Elspeth JAYSON Millers, MD;  Location: [facility] OR;  Service: Thoracic;  Laterality: N/A;    SOCIAL HISTORY:   Social History   Tobacco Use   Smoking status: Every Day    Current packs/day: 0.25    Average packs/day: 0.3 packs/day for 10.0 years (2.5 ttl pk-yrs)    Types: Pipe, Cigarettes   Smokeless tobacco: Former    Quit date: 2013  Substance Use Topics   Alcohol  use: No    Comment: pt states he no longer drinks 2013    FAMILY HISTORY:   Family History  Problem Relation Age of Onset   CAD Mother 67       Sudden death   Diabetes Mellitus II Father    Prostate cancer Paternal Grandfather     DRUG ALLERGIES:   Allergies  Allergen Reactions   Codeine Nausea Only   Ibuprofen  Nausea Only   Morphine      UNSPECIFIED REACTION - shaking, diaphoresis   Morphine  And Codeine     UNSPECIFIED REACTION - shaking, diaphoresis   Tramadol  Nausea Only   Penicillins Nausea And Vomiting    Did it involve swelling of the face/tongue/throat, SOB, or low BP? No Did it involve sudden or severe rash/hives, skin peeling, or any reaction on the inside of your mouth or nose? No Did you need to seek medical attention at a hospital or doctor's office? No When did it last happen?      10+years If all above answers are "NO", may proceed with cephalosporin use.     REVIEW OF SYSTEMS:   ROS As per history of present illness. All pertinent systems were reviewed above. Constitutional, HEENT, cardiovascular, respiratory, GI, GU, musculoskeletal, neuro, psychiatric, endocrine, integumentary and hematologic systems were reviewed and are  otherwise negative/unremarkable except for positive findings mentioned above in the HPI.   MEDICATIONS AT HOME:   Prior to Admission medications   Medication Sig Start Date End Date Taking? Authorizing Provider  albuterol  (VENTOLIN  HFA) 108 (90 Base) MCG/ACT inhaler Inhale 1 puff into the lungs 4 (four) times daily as needed for wheezing or shortness of breath.    [provider]  buprenorphine -naloxone  (SUBOXONE ) 8-2 mg SUBL SL tablet Place 0.5 tablets under the tongue 4 (four) times daily. 04/30/24   [provider]  carbamazepine  (TEGRETOL  XR) 100 MG 12 hr tablet Take 100 mg by mouth 2 (two) times daily. 04/18/24   [provider]  cetirizine (ZYRTEC) 10 MG tablet Take 10 mg by mouth daily.    [provider]  ergocalciferol  (VITAMIN D2) 1.25 MG (50000 UT) capsule Take 50,000 Units by mouth once a week.    [provider]  escitalopram  (LEXAPRO ) 20 MG tablet Take 20 mg by  mouth daily.     [provider]  fluticasone -salmeterol (ADVAIR) 250-50 MCG/ACT AEPB Inhale 1 puff into the lungs in the morning and at bedtime.    [provider]  folic acid  (FOLVITE ) 1 MG tablet Take 1 mg by mouth daily.    [provider]  furosemide  (LASIX ) 20 MG tablet Take 1 tablet (20 mg total) by mouth daily. 09/15/21   Lavona Agent, MD  gabapentin  (NEURONTIN ) 300 MG capsule Take 300 mg by mouth 3 (three) times daily. 04/19/24   [provider]  guaiFENesin  (MUCINEX ) 600 MG 12 hr tablet Take 600 mg by mouth 2 (two) times daily. For sinus headache    [provider]  hydrOXYzine  (ATARAX ) 50 MG tablet Take 50 mg by mouth every 6 (six) hours as needed for anxiety. 04/29/24   [provider]  loperamide  (IMODIUM ) 2 MG capsule Take 2 mg by mouth as needed for diarrhea or loose stools. 04/26/22   [provider]  losartan  (COZAAR ) 25 MG tablet Take 25 mg by mouth daily.    [provider]  methocarbamol   (ROBAXIN ) 750 MG tablet Take 750 mg by mouth 3 (three) times daily.    [provider]  mirtazapine  (REMERON ) 15 MG tablet Take 15 mg by mouth at bedtime. 04/25/24   [provider]  omeprazole (PRILOSEC) 40 MG capsule Take 40 mg by mouth daily. 04/19/24   [provider]  primidone  (MYSOLINE ) 250 MG tablet Take 250 mg by mouth 3 (three) times daily.    [provider]  primidone  (MYSOLINE ) 50 MG tablet Take 50 mg by mouth 3 (three) times daily.    [provider]  QUEtiapine  (SEROQUEL ) 25 MG tablet Take 50 mg by mouth at bedtime.    [provider]  REPATHA SURECLICK 140 MG/ML SOAJ Inject 140 mg into the skin every 14 (fourteen) days. 04/10/24   [provider]  rosuvastatin (CRESTOR) 40 MG tablet Take 40 mg by mouth daily. 04/19/24   [provider]      VITAL SIGNS:  Blood pressure 108/67, pulse 61, temperature 99.5 F (37.5 C), temperature source Oral, resp. rate 18, height 5' 11 (1.803 m), weight 65.3 kg, SpO2 98%.  PHYSICAL EXAMINATION:  Physical Exam  GENERAL:  48 y.o.-year-old patient lying in the bed with no acute distress.  EYES: Pupils equal, round, reactive to light and accommodation. No scleral icterus. Extraocular muscles intact.  HEENT: Head atraumatic, normocephalic. Oropharynx and nasopharynx clear.  NECK:  Supple, no jugular venous distention. No thyroid  enlargement, no tenderness.  LUNGS: Normal breath sounds bilaterally, no wheezing, rales,rhonchi or crepitation. No use of accessory muscles of respiration.  CARDIOVASCULAR: Regular rate and rhythm, S1, S2 normal. No murmurs, rubs, or gallops.  ABDOMEN: Soft, nondistended with left lower quadrant abdominal tenderness without rebound tenderness, guarding or rigidity. Bowel sounds present. No organomegaly or mass.  EXTREMITIES: No pedal edema, cyanosis, or clubbing.  NEUROLOGIC: Cranial nerves II through XII are intact. Muscle strength 5/5 in all  extremities. Sensation intact. Gait not checked.  PSYCHIATRIC: The patient is alert and oriented x 3.  Normal affect and good eye contact. SKIN: No obvious rash, lesion, or ulcer.   LABORATORY PANEL:   CBC Recent Labs  Lab 05/20/24 1759  WBC 11.5*  HGB 14.2  HCT 41.1  PLT 199   ------------------------------------------------------------------------------------------------------------------  Chemistries  Recent Labs  Lab 05/20/24 1759  NA 131*  K 3.5  CL 94*  CO2 26  GLUCOSE 107*  BUN 10  CREATININE 0.66  CALCIUM 8.7*  AST 15  ALT 15  ALKPHOS 85  BILITOT 1.0   ------------------------------------------------------------------------------------------------------------------  Cardiac Enzymes No results for input(s): TROPONINI in the last 168 hours. ------------------------------------------------------------------------------------------------------------------  RADIOLOGY:  CT ABDOMEN PELVIS W CONTRAST Result Date: 05/20/2024 CLINICAL DATA:  LEFT lower quadrant pain. EXAM: CT ABDOMEN AND PELVIS WITH CONTRAST TECHNIQUE: Multidetector CT imaging of the abdomen and pelvis was performed using the standard protocol following bolus administration of intravenous contrast. RADIATION DOSE REDUCTION: This exam was performed according to the departmental dose-optimization program which includes automated exposure control, adjustment of the mA and/or kV according to patient size and/or use of iterative reconstruction technique. CONTRAST:  80mL OMNIPAQUE  IOHEXOL  300 MG/ML  SOLN COMPARISON:  CT 06/11/2021 FINDINGS: Lower chest: Band of linear atelectasis in LEFT lower lobe is chronic. No evidence of pneumonia. Hepatobiliary: No focal hepatic lesion. Normal gallbladder. No biliary duct dilatation. Common bile duct is normal. Pancreas: Pancreas is normal. No ductal dilatation. No pancreatic inflammation. Spleen: Normal spleen Adrenals/urinary tract: Adrenal glands and kidneys are normal.  The ureters and bladder normal. Stomach/Bowel: Stomach, duodenum and small-bowel normal. Appendix is normal. Terminal ileum is grossly normal. There is submucosal edema enhancement involving the ascending, transverse, and descending colon. Submucosal edema in the sigmoid colon. This submucosal edema is severe with thumbprinting sign in the colon. For example image 32/2 in the transverse colon. The rectum is fluids filled with mild mucosal enhancement. Small amount fluid along the pericolic gutters in the pelvis. No diverticulosis. No perforation. No abscess. No mesenteric lymphadenopathy. Vascular/Lymphatic: Abdominal aorta is normal caliber. No periportal or retroperitoneal adenopathy. No pelvic adenopathy. Reproductive: Unremarkable Other: No free fluid. Musculoskeletal: No aggressive osseous lesion. IMPRESSION: 1. Severe pancolitis with prominent submucosal edema. Differential would include infectious colitis, drug induced colitis (including pseudomembranous colitis) or inflammatory bowel disease. No evidence of inflammatory bowel disease on comparison CT. 2. No vascular abnormality. Electronically Signed   By: Jackquline Boxer M.D.   On: 05/20/2024 19:19      IMPRESSION AND PLAN:  Assessment and Plan: * Pancolitis (HCC) - This is acute and severe. - The patient will be admitted to the medical telemetry bed. - Will continue antibiotic therapy with IV Rocephin  and Flagyl . - Will place him on clear liquids and continue hydration with IV normal saline with added potassium chloride  for borderline potassium. - Pain management will be provided.  Hyponatremia - This is likely hypovolemic. - Will continue hydration with IV normal saline and follow BMP.  Dyslipidemia - Will continue statin therapy.  GERD without esophagitis - Continue PPI therapy.  Asthma, chronic - Will continue his inhalers.  Essential hypertension - Will continue antihypertensive therapy.  Anxiety and depression - We  will continue Lexapro , Remeron  and Seroquel .  Seizure disorder (HCC) and pseudoseizures - Will continue primidone .   DVT prophylaxis: Lovenox .  Advanced Care Planning:  Code Status: full code.  Family Communication:  The plan of care was discussed in details with the patient (and family). I answered all questions. The patient agreed to proceed with the above mentioned plan. Further management will depend upon hospital course. Disposition Plan: Back to previous home environment Consults called: none.  All the records are reviewed and case discussed with ED provider.  Status is: Inpatient  At the time of the admission, it appears that the appropriate admission status for this patient is inpatient.  This is judged to be reasonable and necessary in order to provide the required intensity of service  to ensure the patient's safety given the presenting symptoms, physical exam findings and initial radiographic and laboratory data in the context of comorbid conditions.  The patient requires inpatient status due to high intensity of service, high risk of further deterioration and high frequency of surveillance required.  I certify that at the time of admission, it is my clinical judgment that the patient will require inpatient hospital care extending more than 2 midnights.                            Dispo: The patient is from: Group Home              Anticipated d/c is to: Group Home              Patient currently is not medically stable to d/c.              Difficult to place patient: No  Madison DELENA Peaches M.D on 05/20/2024 at 8:35 PM  [facility]   From 7 PM-7 AM, contact night-coverage [email or web address]  CC: Primary care physician; Fanta, Tesfaye Demissie, MD

## 2024-05-20 NOTE — Assessment & Plan Note (Signed)
 Continue PPI therapy.

## 2024-05-20 NOTE — ED Notes (Signed)
1 set blood cultures sent.

## 2024-05-20 NOTE — ED Triage Notes (Signed)
 To ED via [facility] from [facility] group home, [address], [place].  LLQ abdominal pain since 2 days and worse today Has taken motrin , tylenol , heat and cold. BM's normal, no dysuria. No flank pain.   EMS VS: 123/86, HR 76, 99% RA, CBG 111, no DM. Oral temp 102.8, etco2 40, EKG normal  Pain is sharp and constant. LBM 2pm today, normal. Denies NVD.pt is guarding LLQ.

## 2024-05-20 NOTE — Assessment & Plan Note (Signed)
Will continue antihypertensive therapy.

## 2024-05-20 NOTE — ED Notes (Signed)
 Informed RN of assigned bed

## 2024-05-20 NOTE — ED Notes (Signed)
 Patient unable to provide urine sample at this time. States I used the bathroom right before I came back here.

## 2024-05-20 NOTE — Assessment & Plan Note (Signed)
-   Will continue primidone .

## 2024-05-20 NOTE — Assessment & Plan Note (Signed)
-   This is likely hypovolemic. - Will continue hydration with IV normal saline and follow BMP.

## 2024-05-20 NOTE — ED Provider Notes (Signed)
 [facility] Provider Note    Event Date/Time   First MD Initiated Contact with Patient 05/20/24 1821     (approximate)  History   Chief Complaint: Abdominal Pain  HPI  [name] is a 48 y.o. male with a past medical history of anxiety, CHF, COPD, gastric reflux, hypertension, presents to the emergency department for left lower quadrant abdominal pain.  According to the patient for the last few days he has been experiencing pain in the left lower quadrant, now has a fever.  Patient describes moderate pain to this area.  Denies any vomiting or diarrhea.  No constipation.  No urinary symptoms.  Physical Exam   Triage Vital Signs: ED Triage Vitals  Encounter Vitals Group     BP 05/20/24 1754 101/86     Girls Systolic BP Percentile --      Girls Diastolic BP Percentile --      Boys Systolic BP Percentile --      Boys Diastolic BP Percentile --      Pulse Rate 05/20/24 1754 78     Resp 05/20/24 1754 20     Temp 05/20/24 1754 (!) 102.5 F (39.2 C)     Temp Source 05/20/24 1754 Oral     SpO2 05/20/24 1754 94 %     Weight 05/20/24 1754 144 lb (65.3 kg)     Height 05/20/24 1754 5' 11 (1.803 m)     Head Circumference --      Peak Flow --      Pain Score 05/20/24 1755 9     Pain Loc --      Pain Education --      Exclude from Growth Chart --     Most recent vital signs: Vitals:   05/20/24 1754  BP: 101/86  Pulse: 78  Resp: 20  Temp: (!) 102.5 F (39.2 C)  SpO2: 94%    General: Awake, no distress.  CV:  Good peripheral perfusion.  Regular rate and rhythm  Resp:  Normal effort.  Equal breath sounds bilaterally.  Abd:  No distention.  Soft, moderate left lower quadrant tenderness.  Otherwise benign abdomen without rebound or guarding.  ED Results / Procedures / Treatments   RADIOLOGY  I have reviewed interpreted CT images.  I do not appreciate any significant obstruction on my evaluation. Radiology read the CT scan as severe  pancolitis.   MEDICATIONS ORDERED IN ED: Medications  fentaNYL  (SUBLIMAZE ) injection 100 mcg (has no administration in time range)  ondansetron  (ZOFRAN ) injection 4 mg (has no administration in time range)  sodium chloride  0.9 % bolus 1,000 mL (has no administration in time range)  acetaminophen  (TYLENOL ) tablet 650 mg (650 mg Oral Given 05/20/24 1800)     IMPRESSION / MDM / ASSESSMENT AND PLAN / ED COURSE  I reviewed the triage vital signs and the nursing notes.  Patient's presentation is most consistent with acute presentation with potential threat to life or bodily function.  Patient presents emergency department for left lower quadrant abdominal pain now with a fever to 102.  Patient has moderate tenderness to this area otherwise benign abdomen.  Patient's lab work shows a slight leukocytosis otherwise reassuring CBC, reassuring chemistry with normal LFTs, normal lipase.  Lactic acid is normal as well.  We will treat pain nausea, IV hydrate.  We will proceed with a CT scan abdomen/pelvis to further evaluate.  Patient agreeable to plan of care.  Patient's labs have resulted showing a slight leukocytosis  of 11,500, reassuring chemistry.  Normal LFTs, normal lipase.  Patient CT scan has resulted showing severe pancolitis.  Given the patient's fever significant pain requiring multiple doses of IV pain medication we will admit to the hospital service for IV antibiotics and continued workup and treatment.  Patient agreeable to plan  FINAL CLINICAL IMPRESSION(S) / ED DIAGNOSES   Left lower quadrant abdominal pain Fever Pancolitis  Note:  This document was prepared using Dragon voice recognition software and may include unintentional dictation errors.   Dorothyann Drivers, MD 05/20/24 1940

## 2024-05-20 NOTE — Assessment & Plan Note (Signed)
-   We will continue Lexapro , Remeron  and Seroquel .

## 2024-05-20 NOTE — Assessment & Plan Note (Addendum)
-   This is acute and severe. - The patient will be admitted to the medical telemetry bed. - Will continue antibiotic therapy with IV Rocephin  and Flagyl . - Will place him on clear liquids and continue hydration with IV normal saline with added potassium chloride  for borderline potassium. - Pain management will be provided.

## 2024-05-20 NOTE — Assessment & Plan Note (Signed)
-   Will continue his inhalers.

## 2024-05-20 NOTE — ED Notes (Signed)
 Called lab and spoke to Mariano Colan confirming blood cultures have been received prior to beginning antibiotics.

## 2024-05-21 DIAGNOSIS — K529 Noninfective gastroenteritis and colitis, unspecified: Secondary | ICD-10-CM | POA: Diagnosis not present

## 2024-05-21 DIAGNOSIS — K5100 Ulcerative (chronic) pancolitis without complications: Secondary | ICD-10-CM | POA: Diagnosis not present

## 2024-05-21 LAB — CBC
HCT: 31.2 % — ABNORMAL LOW (ref 39.0–52.0)
Hemoglobin: 10.8 g/dL — ABNORMAL LOW (ref 13.0–17.0)
MCH: 31.1 pg (ref 26.0–34.0)
MCHC: 34.6 g/dL (ref 30.0–36.0)
MCV: 89.9 fL (ref 80.0–100.0)
Platelets: 115 10*3/uL — ABNORMAL LOW (ref 150–400)
RBC: 3.47 MIL/uL — ABNORMAL LOW (ref 4.22–5.81)
RDW: 13.2 % (ref 11.5–15.5)
WBC: 6.1 10*3/uL (ref 4.0–10.5)
nRBC: 0 % (ref 0.0–0.2)

## 2024-05-21 LAB — GASTROINTESTINAL PANEL BY PCR, STOOL (REPLACES STOOL CULTURE)

## 2024-05-21 LAB — C-REACTIVE PROTEIN: CRP: 21.3 mg/dL — ABNORMAL HIGH (ref <1.0)

## 2024-05-21 LAB — BASIC METABOLIC PANEL WITH GFR
Anion gap: 7 (ref 5–15)
BUN: 8 mg/dL (ref 6–20)
CO2: 27 mmol/L (ref 22–32)
Calcium: 7.8 mg/dL — ABNORMAL LOW (ref 8.9–10.3)
Chloride: 100 mmol/L (ref 98–111)
Creatinine, Ser: 0.47 mg/dL — ABNORMAL LOW (ref 0.61–1.24)
GFR, Estimated: >60 mL/min (ref >60)
Glucose, Bld: 105 mg/dL — ABNORMAL HIGH (ref 70–99)
Potassium: 3.4 mmol/L — ABNORMAL LOW (ref 3.5–5.1)
Sodium: 134 mmol/L — ABNORMAL LOW (ref 135–145)

## 2024-05-21 LAB — C DIFFICILE QUICK SCREEN W PCR REFLEX
C Diff antigen: POSITIVE — AB
C Diff toxin: NEGATIVE

## 2024-05-21 LAB — CLOSTRIDIUM DIFFICILE BY PCR, REFLEXED: Toxigenic C. Difficile by PCR: POSITIVE — AB

## 2024-05-21 LAB — PHOSPHORUS: Phosphorus: 2.7 mg/dL (ref 2.5–4.6)

## 2024-05-21 LAB — MAGNESIUM: Magnesium: 1.6 mg/dL — ABNORMAL LOW (ref 1.7–2.4)

## 2024-05-21 LAB — SEDIMENTATION RATE: Sed Rate: 25 mm/h — ABNORMAL HIGH (ref 0–15)

## 2024-05-21 MED ORDER — POTASSIUM CHLORIDE CRYS ER 20 MEQ PO TBCR
40.0000 meq | EXTENDED_RELEASE_TABLET | Freq: Once | ORAL | Status: AC
  Administered 2024-05-21: 40 meq via ORAL
  Filled 2024-05-21: qty 2

## 2024-05-21 MED ORDER — SACCHAROMYCES BOULARDII 250 MG PO CAPS
250.0000 mg | ORAL_CAPSULE | Freq: Two times a day (BID) | ORAL | Status: DC
  Administered 2024-05-21 – 2024-05-23 (×5): 250 mg via ORAL
  Filled 2024-05-21 (×5): qty 1

## 2024-05-21 MED ORDER — PANTOPRAZOLE SODIUM 40 MG PO TBEC
40.0000 mg | DELAYED_RELEASE_TABLET | Freq: Two times a day (BID) | ORAL | Status: DC
  Administered 2024-05-21 – 2024-05-23 (×5): 40 mg via ORAL
  Filled 2024-05-21 (×5): qty 1

## 2024-05-21 MED ORDER — MAGNESIUM SULFATE 2 GM/50ML IV SOLN
2.0000 g | Freq: Once | INTRAVENOUS | Status: AC
  Administered 2024-05-21: 2 g via INTRAVENOUS
  Filled 2024-05-21: qty 50

## 2024-05-21 MED ORDER — ACETAMINOPHEN 325 MG PO TABS
650.0000 mg | ORAL_TABLET | Freq: Three times a day (TID) | ORAL | Status: DC
  Administered 2024-05-21 – 2024-05-23 (×7): 650 mg via ORAL
  Filled 2024-05-21 (×7): qty 2

## 2024-05-21 MED ORDER — FENTANYL CITRATE PF 50 MCG/ML IJ SOSY
25.0000 ug | PREFILLED_SYRINGE | INTRAMUSCULAR | Status: DC | PRN
  Administered 2024-05-21 – 2024-05-23 (×6): 25 ug via INTRAVENOUS
  Filled 2024-05-21 (×7): qty 1

## 2024-05-21 MED ORDER — OXYCODONE HCL 5 MG PO TABS
5.0000 mg | ORAL_TABLET | Freq: Four times a day (QID) | ORAL | Status: DC | PRN
  Administered 2024-05-21 – 2024-05-23 (×8): 10 mg via ORAL
  Filled 2024-05-21 (×8): qty 2

## 2024-05-21 NOTE — Progress Notes (Signed)
 [facility] Progress Note  Patient: [name]    FMW:[id number]  DOA: 05/20/2024     Date of Service: the patient was seen and examined on 05/21/2024  Chief Complaint  Patient presents with   Abdominal Pain   Brief hospital course: [name] is a 48 y.o. Caucasian male with medical history significant for anxiety, asthma, CHF, COPD, depression, GERD and hypertension, who presented to the emergency room with acute onset of left lower quadrant abdominal pain which has been going on over the last few days with associated fever today that was up to 102.  He admitted to diarrhea with watery bowel movements.  He denied any nausea or vomiting or constipation.  No dysuria, oliguria or hematuria or flank pain.  No chest pain or palpitations.  No cough or wheezing or dyspnea.  No melena or bright red bleeding per rectum.  No bleeding diathesis.  He is taking any recent antibiotics.   ED Course: When the patient came to the ER, temperature was 102.5 and otherwise vital signs were within normal.  Labs revealed hyponatremia 131 and hypochloremia of 94, with calcium of 8.7 otherwise unremarkable CMP.  CBC showed leukocytosis of 11.5 and lactic acid was 1.2.  UA came back with elevated specific gravity of 1039.  Blood culture were drawn. EKG as reviewed by me : None. Imaging: Abdominal and pelvic CT scan with contrast revealed the following: 1. Severe pancolitis with prominent submucosal edema. Differential would include infectious colitis, drug induced colitis (including pseudomembranous colitis) or inflammatory bowel disease. No evidence of inflammatory bowel disease on comparison CT. 2. No vascular abnormality.   The patient was given 1 L bolus of IV normal saline, 4 mg of IV Zofran , 1 mg of IV Dilaudid  and 100 mcg of IV fentanyl , 400 mg of IV Cipro  and 500 mg of IV Flagyl .  The patient will be admitted to a medical telemetry bed for further evaluation and management.   Assessment and  Plan:  # Acute Pancolitis, unknown cause, most likely infectious, less likely inflammatory bowel disease. continue IV Rocephin  and Flagyl .  Started probiotics twice daily Started full liquid diet PRN Pain management GI consulted, recommended no endoluminal exam at this time until infectious cause will be ruled out C. difficile antigen positive, PCR pending Follow GIP Continue IV fluid for hydration  # Hypokalemia, potassium repleted. # Hypomagnesemia, mag repleted. # Hyponatremia, continue IV fluid and monitor Monitor electrolytes daily and replete as needed    # Dyslipidemia: continue statin therapy. # GERD without esophagitis: Continue PPI therapy. # Asthma, chronic: continue his inhalers. #  Essential hypertension: continue antihypertensive therapy. # Anxiety and depression: continue Lexapro , Remeron  and Seroquel . # Seizure disorder and pseudoseizures: continue primidone .   Body mass index is 20.08 kg/m.  Interventions:  Diet: CLD>>FLD on 7/1 DVT Prophylaxis: Subcutaneous Lovenox    Advance goals of care discussion: Full code  Family Communication: family was not present at bedside, at the time of interview.  The pt provided permission to discuss medical plan with the family. Opportunity was given to ask question and all questions were answered satisfactorily.   Disposition:  Pt is from group home DSS is guardian, admitted with pancolitis, still has abdominal pain, on full liquid diet and IV antibiotics, which precludes a safe discharge. Discharge to group home, when stable, may need few days to improve.  Subjective: No significant events overnight, patient had 4 episode of diarrhea since last night, abdominal pain 9/10 in the left lower quadrant.  Denies  any chest pain or palpitation, no shortness of breath, no any other complaints. Patient wants to advance his diet, so we will advance to full liquid.  Physical Exam: General: NAD, lying comfortably Appear in no  distress, affect appropriate Eyes: PERRLA ENT: Oral Mucosa Clear, moist  Neck: no JVD,  Cardiovascular: S1 and S2 Present, no Murmur,  Respiratory: good respiratory effort, Bilateral Air entry equal and Decreased, no Crackles, no wheezes Abdomen: BS present, Soft and LLQ tenderness,  Skin: no rashes Extremities: no Pedal edema, no calf tenderness Neurologic: without any new focal findings Gait not checked due to patient safety concerns  Vitals:   05/20/24 2051 05/20/24 2115 05/21/24 0210 05/21/24 0822  BP: 119/77 126/88 (!) 114/58 113/67  Pulse: 64 66 69 72  Resp: 18 16 18 18   Temp: 99.8 F (37.7 C) 98.4 F (36.9 C) 98 F (36.7 C) 99.4 F (37.4 C)  TempSrc: Oral   Oral  SpO2: 95% 99% 95% 93%  Weight:      Height:        Intake/Output Summary (Last 24 hours) at 05/21/2024 0912 Last data filed at 05/21/2024 9482 Gross per 24 hour  Intake 770.62 ml  Output --  Net 770.62 ml   Filed Weights   05/20/24 1754  Weight: 65.3 kg    Data Reviewed: I have personally reviewed and interpreted daily labs, tele strips, imagings as discussed above. I reviewed all nursing notes, pharmacy notes, vitals, pertinent old records I have discussed plan of care as described above with RN and patient/family.  CBC: Recent Labs  Lab 05/20/24 1759 05/21/24 0439  WBC 11.5* 6.1  HGB 14.2 10.8*  HCT 41.1 31.2*  MCV 89.2 89.9  PLT 199 115*   Basic Metabolic Panel: Recent Labs  Lab 05/20/24 1759 05/21/24 0439  NA 131* 134*  K 3.5 3.4*  CL 94* 100  CO2 26 27  GLUCOSE 107* 105*  BUN 10 8  CREATININE 0.66 0.47*  CALCIUM 8.7* 7.8*  MG  --  1.6*  PHOS  --  2.7    Studies: CT ABDOMEN PELVIS W CONTRAST Result Date: 05/20/2024 CLINICAL DATA:  LEFT lower quadrant pain. EXAM: CT ABDOMEN AND PELVIS WITH CONTRAST TECHNIQUE: Multidetector CT imaging of the abdomen and pelvis was performed using the standard protocol following bolus administration of intravenous contrast. RADIATION DOSE  REDUCTION: This exam was performed according to the departmental dose-optimization program which includes automated exposure control, adjustment of the mA and/or kV according to patient size and/or use of iterative reconstruction technique. CONTRAST:  80mL OMNIPAQUE  IOHEXOL  300 MG/ML  SOLN COMPARISON:  CT 06/11/2021 FINDINGS: Lower chest: Band of linear atelectasis in LEFT lower lobe is chronic. No evidence of pneumonia. Hepatobiliary: No focal hepatic lesion. Normal gallbladder. No biliary duct dilatation. Common bile duct is normal. Pancreas: Pancreas is normal. No ductal dilatation. No pancreatic inflammation. Spleen: Normal spleen Adrenals/urinary tract: Adrenal glands and kidneys are normal. The ureters and bladder normal. Stomach/Bowel: Stomach, duodenum and small-bowel normal. Appendix is normal. Terminal ileum is grossly normal. There is submucosal edema enhancement involving the ascending, transverse, and descending colon. Submucosal edema in the sigmoid colon. This submucosal edema is severe with thumbprinting sign in the colon. For example image 32/2 in the transverse colon. The rectum is fluids filled with mild mucosal enhancement. Small amount fluid along the pericolic gutters in the pelvis. No diverticulosis. No perforation. No abscess. No mesenteric lymphadenopathy. Vascular/Lymphatic: Abdominal aorta is normal caliber. No periportal or retroperitoneal adenopathy. No pelvic adenopathy.  Reproductive: Unremarkable Other: No free fluid. Musculoskeletal: No aggressive osseous lesion. IMPRESSION: 1. Severe pancolitis with prominent submucosal edema. Differential would include infectious colitis, drug induced colitis (including pseudomembranous colitis) or inflammatory bowel disease. No evidence of inflammatory bowel disease on comparison CT. 2. No vascular abnormality. Electronically Signed   By: Jackquline Boxer M.D.   On: 05/20/2024 19:19    Scheduled Meds:  acetaminophen   650 mg Oral TID    buprenorphine -naloxone   0.5 tablet Sublingual QID   carbamazepine   100 mg Oral BID   enoxaparin  (LOVENOX ) injection  40 mg Subcutaneous Q24H   escitalopram   20 mg Oral Daily   fluticasone  furoate-vilanterol  1 puff Inhalation Daily   folic acid   1 mg Oral Daily   furosemide   20 mg Oral Daily   gabapentin   300 mg Oral TID   guaiFENesin   600 mg Oral BID   loratadine   10 mg Oral Daily   losartan   25 mg Oral Daily   methocarbamol   750 mg Oral TID   mirtazapine   15 mg Oral QHS   pantoprazole   40 mg Oral BID   primidone   250 mg Oral TID   primidone   50 mg Oral TID   QUEtiapine   50 mg Oral QHS   rosuvastatin  40 mg Oral Daily   [START ON 05/26/2024] Vitamin D  (Ergocalciferol )  50,000 Units Oral Weekly   Continuous Infusions:  0.9 % NaCl with KCl 20 mEq / L 100 mL/hr at 05/20/24 2154   ciprofloxacin  400 mg (05/21/24 0504)   metronidazole      PRN Meds: albuterol , fentaNYL  (SUBLIMAZE ) injection, hydrOXYzine , ondansetron  **OR** ondansetron  (ZOFRAN ) IV, oxyCODONE , traZODone   Time spent: 55 minutes  Author: ELVAN SOR. MD [facility] 05/21/2024 9:12 AM  To reach On-call, see care teams to locate the attending and reach out to them via [email or web address]. If 7PM-7AM, please contact night-coverage If you still have difficulty reaching the attending provider, please page the [facility] (Director on Call) for [facility] on amion for assistance.

## 2024-05-21 NOTE — TOC Initial Note (Signed)
 Transition of Care [facility]) - Initial/Assessment Note    Patient Details  Name: [name] MRN: [id number] Date of Birth: 10/11/1976  [facility] CM/SW Contact:    Lauraine JAYSON Carpen, LCSW Phone Number: 05/21/2024, 4:01 PM  Clinical Narrative:  Per chart review, patient is from [facility] and has a legal guardian through [facility] DSS. CSW called assigned DSS social worker, Dorn Roads, and confirmed. CSW will continue to follow patient for support and facilitate return to group home once medically stable.                Expected Discharge Plan: Group Home Barriers to Discharge: Continued Medical Work up   Patient Goals and CMS Choice            Expected Discharge Plan and Services     Post Acute Care Choice: Resumption of Svcs/PTA Provider Living arrangements for the past 2 months: Group Home                                      Prior Living Arrangements/Services Living arrangements for the past 2 months: Group Home Lives with:: Facility Resident Patient language and need for interpreter reviewed:: Yes Do you feel safe going back to the place where you live?: Yes      Need for Family Participation in Patient Care: Yes (Comment) Care giver support system in place?: Yes (comment)   Criminal Activity/Legal Involvement Pertinent to Current Situation/Hospitalization: No - Comment as needed  Activities of Daily Living   ADL Screening (condition at time of admission) Independently performs ADLs?: No Does the patient have a NEW difficulty with bathing/dressing/toileting/self-feeding that is expected to last >3 days?: No Does the patient have a NEW difficulty with getting in/out of bed, walking, or climbing stairs that is expected to last >3 days?: No Does the patient have a NEW difficulty with communication that is expected to last >3 days?: No Is the patient deaf or have difficulty hearing?: No Does the patient have  difficulty seeing, even when wearing glasses/contacts?: No Does the patient have difficulty concentrating, remembering, or making decisions?: No  Permission Sought/Granted Permission sought to share information with : Facility Medical sales representative, Guardian Permission granted to share information with : Yes, Verbal Permission Granted  Share Information with NAME: Dorn Roads  Permission granted to share info w AGENCY: [facility]  Permission granted to share info w Relationship: Legal Guardian  Permission granted to share info w Contact Information: [phone]  Emotional Assessment       Orientation: : Oriented to Self, Oriented to Place, Oriented to  Time, Oriented to Situation Alcohol  / Substance Use: Not Applicable Psych Involvement: No (comment)  Admission diagnosis:  Colitis [K52.9] Pancolitis (HCC) [K51.00] Patient Active Problem List   Diagnosis Date Noted   Pancolitis (HCC) 05/20/2024   Dyslipidemia 05/20/2024   GERD without esophagitis 05/20/2024   Anxiety and depression 05/20/2024   Essential hypertension 05/20/2024   Asthma, chronic 05/20/2024   Pneumonia 05/06/2024   Lymphedema 03/12/2023   Diarrhea 10/31/2022   Dysphagia 03/24/2022   Chronic diastolic HF (heart failure) (HCC) 09/14/2021   Closed fracture of right proximal tibia 11/05/2019   Closed displaced bicondylar fracture of right tibia 12/03/2018   Cellulitis of left lower extremity    Cellulitis of left foot 03/07/2018   COPD (chronic obstructive pulmonary disease) (HCC) 05/03/2017   Hyponatremia 06/27/2016  Gastroesophageal reflux disease 11/11/2015   Borderline personality disorder (HCC) 11/11/2015   Alcohol  use disorder, severe, in controlled environment (HCC) 11/11/2015   MDD (major depressive disorder), recurrent severe, 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 [place] [address] Phone: [phone] Fax: [phone]  [facility] [place], [place] - [address] [place] [address] Phone: [phone] Fax: [phone]  [facility] [place], [place] - [address] [place] [address] Phone: [phone] Fax: [phone]  [facility] [address] [place] [address] Phone: [phone] Fax: [phone]     Social Drivers of Health (SDOH) Social History: SDOH Screenings   Food Insecurity: No Food Insecurity (05/20/2024)  Housing: Low Risk  (05/20/2024)  Transportation Needs: No Transportation Needs (05/20/2024)  Utilities: Not At Risk (05/20/2024)  Social Connections: Socially Isolated (05/20/2024)  Tobacco Use: High Risk (05/20/2024)   SDOH Interventions:     Readmission Risk Interventions     No data to display

## 2024-05-21 NOTE — Progress Notes (Signed)
 I have reviewed and concur with this student's documentation.   Carola Beal, RN 05/21/2024 2:12 PM

## 2024-05-21 NOTE — Plan of Care (Signed)

## 2024-05-21 NOTE — Consult Note (Signed)
 Shane Rogelia Copping, MD [facility]  [address], [address] [place], [place] [address] Phone: [phone] Fax : [phone]  Consultation  Referring Provider:     Dr. Von Primary Care Physician:  Carlette Benita Area, MD Primary Gastroenterologist:  Dr. Eartha         Reason for Consultation:     Colitis  Date of Admission:  05/20/2024 Date of Consultation:  05/21/2024         HPI:   [name] is a 48 y.o. male who was recently in the hospital for pneumonia and was started on antibiotics.  The patient reports that he started to have diarrhea with left lower quadrant pain.  He states that it been going on for a few days before he came to the hospital with a fever of 102.  The diarrhea was watery and he was not having any nausea or vomiting associated with it.  The patient has a history of depression, anxiety, asthma, COPD and hypertension.  The patient had a CT scan of the abdomen that showed:  IMPRESSION: 1. Severe pancolitis with prominent submucosal edema. Differential would include infectious colitis, drug induced colitis (including pseudomembranous colitis) or inflammatory bowel disease. No evidence of inflammatory bowel disease on comparison CT. 2. No vascular abnormality.  A stool sample had not been collected until about an hour ago and the results are pending.  The patient denies any history of diarrhea or longstanding GI problems.  On admission the patient did have an elevated white cell count of 11.5 that has come down to 6.1.  His hemoglobin on admission appears to have been hemoconcentrated with a hemoglobin of 14.2 with 2 weeks prior being 11.2.  Hemoglobin this morning was 10.8  Past Medical History:  Diagnosis Date   Anxiety    Arthritis    knees and fingers   Asthma    CHF (congestive heart failure) (HCC)    COPD (chronic obstructive pulmonary disease) (HCC)    Depression    Encephalopathy    GERD (gastroesophageal reflux disease)    Hypertension     Hyponatremia 06/2016   Pneumonia 2017   Renal disorder    kidney injury    Past Surgical History:  Procedure Laterality Date   BIOPSY  05/10/2022   Procedure: BIOPSY;  Surgeon: Eartha Angelia Sieving, MD;  [facility];  Service: Gastroenterology;;   COLONOSCOPY     ESOPHAGOGASTRODUODENOSCOPY     ESOPHAGOGASTRODUODENOSCOPY (EGD) WITH PROPOFOL  N/A 05/10/2022   Procedure: ESOPHAGOGASTRODUODENOSCOPY (EGD) WITH PROPOFOL ;  Surgeon: Eartha Angelia Sieving, MD;  [facility];  Service: Gastroenterology;  Laterality: N/A;  205   EXTERNAL FIXATION LEG Right 12/01/2018   Procedure: EXTERNAL FIXATION LEG;  Surgeon: Marchia Drivers, MD;  Location: [facility] ORS;  Service: Orthopedics;  Laterality: Right;   EXTERNAL FIXATION REMOVAL Right 12/06/2018   Procedure: REMOVAL EXTERNAL FIXATION LEG;  Surgeon: Kendal Drivers SQUIBB, MD;  Location: [facility] OR;  Service: Orthopedics;  Laterality: Right;   FINGER SURGERY Left    5th digit-Fracture-worse now than before   HARDWARE REMOVAL Right 12/06/2019   Procedure: HARDWARE REMOVAL RIGHT TIBIAL PLATEAU;  Surgeon: Kendal Drivers SQUIBB, MD;  Location: [facility] OR;  Service: Orthopedics;  Laterality: Right;   ORIF TIBIA PLATEAU Right 12/06/2018   Procedure: OPEN REDUCTION INTERNAL FIXATION (ORIF) TIBIAL PLATEAU;  Surgeon: Kendal Drivers SQUIBB, MD;  Location: [facility] OR;  Service: Orthopedics;  Laterality: Right;   PLEURAL EFFUSION DRAINAGE Right 01/02/2016   Procedure: DRAINAGE OF PLEURAL  EFFUSION;  Surgeon: Elspeth JAYSON Millers, MD;  Location: [facility] OR;  Service: Thoracic;  Laterality: Right;   VIDEO ASSISTED THORACOSCOPY (VATS)/DECORTICATION Right 01/02/2016   Procedure: VIDEO ASSISTED THORACOSCOPY (VATS)/DECORTICATION;  Surgeon: Elspeth JAYSON Millers, MD;  Location: [facility] OR;  Service: Thoracic;  Laterality: Right;   VIDEO BRONCHOSCOPY N/A 01/02/2016   Procedure: VIDEO BRONCHOSCOPY;  Surgeon: Elspeth JAYSON Millers, MD;  Location: [facility] OR;  Service: Thoracic;  Laterality: N/A;     Prior to Admission medications   Medication Sig Start Date End Date Taking? Authorizing Provider  albuterol  (VENTOLIN  HFA) 108 (90 Base) MCG/ACT inhaler Inhale 1 puff into the lungs 4 (four) times daily as needed for wheezing or shortness of breath.    [provider]  buprenorphine -naloxone  (SUBOXONE ) 8-2 mg SUBL SL tablet Place 0.5 tablets under the tongue 4 (four) times daily. 04/30/24   [provider]  carbamazepine  (TEGRETOL  XR) 100 MG 12 hr tablet Take 100 mg by mouth 2 (two) times daily. 04/18/24   [provider]  cetirizine (ZYRTEC) 10 MG tablet Take 10 mg by mouth daily.    [provider]  ergocalciferol  (VITAMIN D2) 1.25 MG (50000 UT) capsule Take 50,000 Units by mouth once a week.    [provider]  escitalopram  (LEXAPRO ) 20 MG tablet Take 20 mg by mouth daily.     [provider]  fluticasone -salmeterol (ADVAIR) 250-50 MCG/ACT AEPB Inhale 1 puff into the lungs in the morning and at bedtime.    [provider]  folic acid  (FOLVITE ) 1 MG tablet Take 1 mg by mouth daily.    [provider]  furosemide  (LASIX ) 20 MG tablet Take 1 tablet (20 mg total) by mouth daily. 09/15/21   Lavona Agent, MD  gabapentin  (NEURONTIN ) 300 MG capsule Take 300 mg by mouth 3 (three) times daily. 04/19/24   [provider]  guaiFENesin  (MUCINEX ) 600 MG 12 hr tablet Take 600 mg by mouth 2 (two) times daily. For sinus headache    [provider]  hydrOXYzine  (ATARAX ) 50 MG tablet Take 50 mg by mouth every 6 (six) hours as needed for anxiety. 04/29/24   [provider]  loperamide  (IMODIUM ) 2 MG capsule Take 2 mg by mouth as needed for diarrhea or loose stools. 04/26/22   [provider]  losartan  (COZAAR ) 25 MG tablet Take 25 mg by mouth daily.    [provider]  methocarbamol  (ROBAXIN ) 750 MG tablet Take 750 mg by mouth 3 (three) times daily.    [provider]  mirtazapine  (REMERON )  15 MG tablet Take 15 mg by mouth at bedtime. 04/25/24   [provider]  omeprazole (PRILOSEC) 40 MG capsule Take 40 mg by mouth daily. 04/19/24   [provider]  primidone  (MYSOLINE ) 250 MG tablet Take 250 mg by mouth 3 (three) times daily.    [provider]  primidone  (MYSOLINE ) 50 MG tablet Take 50 mg by mouth 3 (three) times daily.    [provider]  QUEtiapine  (SEROQUEL ) 25 MG tablet Take 50 mg by mouth at bedtime.    [provider]  REPATHA SURECLICK 140 MG/ML SOAJ Inject 140 mg into the skin every 14 (fourteen) days. 04/10/24   [provider]  rosuvastatin (CRESTOR) 40 MG tablet Take 40 mg by mouth daily. 04/19/24   [provider]    Family History  Problem Relation Age of Onset   CAD Mother 28       Sudden death   Diabetes  Mellitus II Father    Prostate cancer Paternal Grandfather      Social History   Tobacco Use   Smoking status: Every Day    Current packs/day: 0.25    Average packs/day: 0.3 packs/day for 10.0 years (2.5 ttl pk-yrs)    Types: Pipe, Cigarettes   Smokeless tobacco: Former    Quit date: 2013  Vaping Use   Vaping status: Never Used  Substance Use Topics   Alcohol  use: No    Comment: pt states he no longer drinks 2013   Drug use: No    Allergies as of 05/20/2024 - Review Complete 05/20/2024  Allergen Reaction Noted   Codeine Nausea Only 03/29/2022   Ibuprofen  Nausea Only 03/29/2022   Morphine   11/21/2018   Morphine  and codeine  11/21/2018   Tramadol  Nausea Only 03/29/2022   Penicillins Nausea And Vomiting 03/06/2016    Review of Systems:    All systems reviewed and negative except where noted in HPI.   Physical Exam:  Vital signs in last 24 hours: Temp:  [98 F (36.7 C)-102.5 F (39.2 C)] 99.4 F (37.4 C) (07/01 0822) Pulse Rate:  [61-78] 72 (07/01 0822) Resp:  [16-20] 18 (07/01 0822) BP: (101-126)/(58-88) 113/67 (07/01 0822) SpO2:  [93 %-99 %] 93 % (07/01 0822) Weight:   [65.3 kg] 65.3 kg (06/30 1754) Last BM Date : 05/20/24 General:   Pleasant, cooperative in NAD Head:  Normocephalic and atraumatic. Eyes:   No icterus.   Conjunctiva pink. PERRLA. Ears:  Normal auditory acuity. Neck:  Supple; no masses or thyroidomegaly Rectal:  Not performed. Msk:  Symmetrical without gross deformities.     Neurologic:  Alert and oriented x3;  grossly normal neurologically. Skin:  Intact without significant lesions or rashes. Cervical Nodes:  No significant cervical adenopathy. Psych:  Alert and cooperative. Normal affect.  LAB RESULTS: Recent Labs    05/20/24 1759 05/21/24 0439  WBC 11.5* 6.1  HGB 14.2 10.8*  HCT 41.1 31.2*  PLT 199 115*   BMET Recent Labs    05/20/24 1759 05/21/24 0439  NA 131* 134*  K 3.5 3.4*  CL 94* 100  CO2 26 27  GLUCOSE 107* 105*  BUN 10 8  CREATININE 0.66 0.47*  CALCIUM 8.7* 7.8*   LFT Recent Labs    05/20/24 1759  PROT 7.6  ALBUMIN 3.9  AST 15  ALT 15  ALKPHOS 85  BILITOT 1.0   PT/INR No results for input(s): LABPROT, INR in the last 72 hours.  STUDIES: CT ABDOMEN PELVIS W CONTRAST Result Date: 05/20/2024 CLINICAL DATA:  LEFT lower quadrant pain. EXAM: CT ABDOMEN AND PELVIS WITH CONTRAST TECHNIQUE: Multidetector CT imaging of the abdomen and pelvis was performed using the standard protocol following bolus administration of intravenous contrast. RADIATION DOSE REDUCTION: This exam was performed according to the departmental dose-optimization program which includes automated exposure control, adjustment of the mA and/or kV according to patient size and/or use of iterative reconstruction technique. CONTRAST:  80mL OMNIPAQUE  IOHEXOL  300 MG/ML  SOLN COMPARISON:  CT 06/11/2021 FINDINGS: Lower chest: Band of linear atelectasis in LEFT lower lobe is chronic. No evidence of pneumonia. Hepatobiliary: No focal hepatic lesion. Normal gallbladder. No biliary duct dilatation. Common bile duct is normal. Pancreas: Pancreas is  normal. No ductal dilatation. No pancreatic inflammation. Spleen: Normal spleen Adrenals/urinary tract: Adrenal glands and kidneys are normal. The ureters and bladder normal. Stomach/Bowel: Stomach, duodenum and small-bowel normal. Appendix is normal. Terminal ileum is grossly normal. There is submucosal edema enhancement  involving the ascending, transverse, and descending colon. Submucosal edema in the sigmoid colon. This submucosal edema is severe with thumbprinting sign in the colon. For example image 32/2 in the transverse colon. The rectum is fluids filled with mild mucosal enhancement. Small amount fluid along the pericolic gutters in the pelvis. No diverticulosis. No perforation. No abscess. No mesenteric lymphadenopathy. Vascular/Lymphatic: Abdominal aorta is normal caliber. No periportal or retroperitoneal adenopathy. No pelvic adenopathy. Reproductive: Unremarkable Other: No free fluid. Musculoskeletal: No aggressive osseous lesion. IMPRESSION: 1. Severe pancolitis with prominent submucosal edema. Differential would include infectious colitis, drug induced colitis (including pseudomembranous colitis) or inflammatory bowel disease. No evidence of inflammatory bowel disease on comparison CT. 2. No vascular abnormality. Electronically Signed   By: Jackquline Boxer M.D.   On: 05/20/2024 19:19      Impression / Plan:   Assessment: Principal Problem:   Pancolitis (HCC) Active Problems:   Seizure disorder (HCC) and pseudoseizures   Hyponatremia   Dyslipidemia   GERD without esophagitis   Anxiety and depression   Essential hypertension   Asthma, chronic   Treg Diemer is a 48 y.o. y/o male with with recent antibiotic use and resulting diarrhea with a CT scan showing pancolitis.  The patient likely has antibiotic associated colitis since he has no history of inflammatory bowel disease and it is unlikely that he just developed inflammatory bowel disease right after having pneumonia and being  treated with antibiotics.  During the dictation of this consult the patient's C. difficile antigen came back as positive.  The reflex PCR is still pending.  Plan:  The patient should be presumed to have C. difficile colitis until proven otherwise with the PCR.  The patient also has a GI panel pending.  It is unlikely as stated above that this patient has developed inflammatory bowel disease such as ulcerative colitis and as the CT scan reported it is likely an infectious colitis.  If there is any concerns about treatment for infectious colitis or isolation of this patient at this point I will leave it to the hospitalist team and possibly an infectious disease consult.  The patient is not in need of any endoscopic procedures at this time for biopsies until pathogens have been ruled out.  Thank you for involving me in the care of this patient.      LOS: 1 day   Rogelia Copping, MD, MD. NOLIA 05/21/2024, 2:07 PM,  Pager [phone] 7am-5pm  Check AMION for 5pm -7am coverage and on weekends   Note: This dictation was prepared with Dragon dictation along with smaller phrase technology. Any transcriptional errors that result from this process are unintentional.

## 2024-05-22 ENCOUNTER — Other Ambulatory Visit: Payer: Self-pay

## 2024-05-22 ENCOUNTER — Telehealth (HOSPITAL_COMMUNITY): Payer: Self-pay | Admitting: Pharmacy Technician

## 2024-05-22 ENCOUNTER — Other Ambulatory Visit (HOSPITAL_COMMUNITY): Payer: Self-pay

## 2024-05-22 DIAGNOSIS — K5100 Ulcerative (chronic) pancolitis without complications: Secondary | ICD-10-CM | POA: Diagnosis not present

## 2024-05-22 LAB — BASIC METABOLIC PANEL WITH GFR
Anion gap: 5 (ref 5–15)
BUN: <5 mg/dL — ABNORMAL LOW (ref 6–20)
CO2: 27 mmol/L (ref 22–32)
Calcium: 7.9 mg/dL — ABNORMAL LOW (ref 8.9–10.3)
Chloride: 106 mmol/L (ref 98–111)
Creatinine, Ser: 0.46 mg/dL — ABNORMAL LOW (ref 0.61–1.24)
GFR, Estimated: >60 mL/min (ref >60)
Glucose, Bld: 119 mg/dL — ABNORMAL HIGH (ref 70–99)
Potassium: 3.6 mmol/L (ref 3.5–5.1)
Sodium: 138 mmol/L (ref 135–145)

## 2024-05-22 LAB — MAGNESIUM: Magnesium: 1.9 mg/dL (ref 1.7–2.4)

## 2024-05-22 LAB — CBC
HCT: 32.6 % — ABNORMAL LOW (ref 39.0–52.0)
Hemoglobin: 10.9 g/dL — ABNORMAL LOW (ref 13.0–17.0)
MCH: 30.4 pg (ref 26.0–34.0)
MCHC: 33.4 g/dL (ref 30.0–36.0)
MCV: 90.8 fL (ref 80.0–100.0)
Platelets: 125 10*3/uL — ABNORMAL LOW (ref 150–400)
RBC: 3.59 MIL/uL — ABNORMAL LOW (ref 4.22–5.81)
RDW: 13.3 % (ref 11.5–15.5)
WBC: 4.5 10*3/uL (ref 4.0–10.5)
nRBC: 0 % (ref 0.0–0.2)

## 2024-05-22 LAB — PHOSPHORUS: Phosphorus: 2.4 mg/dL — ABNORMAL LOW (ref 2.5–4.6)

## 2024-05-22 MED ORDER — SODIUM CHLORIDE 0.9 % IV SOLN: INTRAVENOUS | Status: DC

## 2024-05-22 MED ORDER — FIDAXOMICIN 200 MG PO TABS
200.0000 mg | ORAL_TABLET | Freq: Two times a day (BID) | ORAL | Status: DC
  Administered 2024-05-22 – 2024-05-23 (×3): 200 mg via ORAL
  Filled 2024-05-22 (×3): qty 1

## 2024-05-22 MED ORDER — K PHOS MONO-SOD PHOS DI & MONO 155-852-130 MG PO TABS
500.0000 mg | ORAL_TABLET | Freq: Four times a day (QID) | ORAL | Status: AC
  Administered 2024-05-22 (×3): 500 mg via ORAL
  Filled 2024-05-22 (×3): qty 2

## 2024-05-22 NOTE — Telephone Encounter (Signed)
 Patient Product/process development scientist completed.    The patient is insured through [employer]. Patient has Medicare and is not eligible for a copay card, but may be able to apply for patient assistance or Medicare RX Payment Plan (Patient Must reach out to their plan, if eligible for payment plan), if available.    Ran test claim for Dificid 200 mg and the current 10 day co-pay is $0.00.   This test claim was processed through [facility]- copay amounts may vary at other pharmacies due to pharmacy/plan contracts, or as the patient moves through the different stages of their insurance plan.     Reyes Sharps, CPHT Pharmacy Technician III Certified Patient Advocate [facility] Patient Advocate Team Direct Number: [phone]  Fax: [phone]

## 2024-05-22 NOTE — Progress Notes (Signed)
 The patient was consulted for pancolitis after receiving antibiotics for pneumonia last month.  The patient had his C. difficile toxin and PCR come back positive.  The patient was on Flagyl  which is not indicated for C. difficile and this was stopped by me last night and Dificid was ordered.  Nothing further to do from a GI point of view for colonic infection.  If there is any complications of his treatment or question of his response then ID should be consulted.  No need for any colonoscopy at this time.  I will sign off.  Please call if any further GI concerns or questions.  We would like to thank you for the opportunity to participate in the care of [name].

## 2024-05-22 NOTE — Plan of Care (Signed)
   Problem: Education: Goal: Knowledge of General Education information will improve Description Including pain rating scale, medication(s)/side effects and non-pharmacologic comfort measures Outcome: Progressing   Problem: Health Behavior/Discharge Planning: Goal: Ability to manage health-related needs will improve Outcome: Progressing

## 2024-05-22 NOTE — Plan of Care (Signed)

## 2024-05-22 NOTE — Care Management Important Message (Signed)
 Important Message  Patient Details  Name: [name] MRN: [id number] Date of Birth: 01-24-1976   Important Message Given:  Yes - Medicare IM     Rojelio SHAUNNA Rattler 05/22/2024, 1:05 PM

## 2024-05-22 NOTE — Progress Notes (Signed)
 [facility] Progress Note  Patient: [name]    FMW:[id number]  DOA: 05/20/2024     Date of Service: the patient was seen and examined on 05/22/2024  Chief Complaint  Patient presents with   Abdominal Pain   Brief hospital course: Etheridge Geil is a 48 y.o. Caucasian male with medical history significant for anxiety, asthma, CHF, COPD, depression, GERD and hypertension, who presented to the emergency room with acute onset of left lower quadrant abdominal pain which has been going on over the last few days with associated fever today that was up to 102.  He admitted to diarrhea with watery bowel movements.  He denied any nausea or vomiting or constipation.  No dysuria, oliguria or hematuria or flank pain.  No chest pain or palpitations.  No cough or wheezing or dyspnea.  No melena or bright red bleeding per rectum.  No bleeding diathesis.  He is taking any recent antibiotics.   ED Course: When the patient came to the ER, temperature was 102.5 and otherwise vital signs were within normal.  Labs revealed hyponatremia 131 and hypochloremia of 94, with calcium of 8.7 otherwise unremarkable CMP.  CBC showed leukocytosis of 11.5 and lactic acid was 1.2.  UA came back with elevated specific gravity of 1039.  Blood culture were drawn. EKG as reviewed by me : None. Imaging: Abdominal and pelvic CT scan with contrast revealed the following: 1. Severe pancolitis with prominent submucosal edema. Differential would include infectious colitis, drug induced colitis (including pseudomembranous colitis) or inflammatory bowel disease. No evidence of inflammatory bowel disease on comparison CT. 2. No vascular abnormality.   The patient was given 1 L bolus of IV normal saline, 4 mg of IV Zofran , 1 mg of IV Dilaudid  and 100 mcg of IV fentanyl , 400 mg of IV Cipro  and 500 mg of IV Flagyl .  The patient will be admitted to a medical telemetry bed for further evaluation and management.   Assessment and  Plan:  # Acute Pancolitis due C. difficile infection S/p Cipro  and Flagyl , DC'd on 7/ Continue probiotics twice daily 7/2 started Dificid 200 mg p.o. twice daily for 10 days as per GI C. difficile positive, GIP negative S/p FLD, advance to soft diet on 7/2 Continue PRN Pain management GI consulted, recommended no endoluminal exam at this time  Continue IV fluid for hydration  # Hypokalemia, potassium repleted. # Hypomagnesemia, mag repleted. # Hyponatremia, resolved, s/p IV fluid and monitor Monitor electrolytes daily and replete as needed    # Dyslipidemia: continue statin therapy. # GERD without esophagitis: Continue PPI therapy. # Asthma, chronic: continue his inhalers. #  Essential hypertension: continue antihypertensive therapy. # Anxiety and depression: continue Lexapro , Remeron  and Seroquel . # Seizure disorder and pseudoseizures: continue primidone .   Body mass index is 20.08 kg/m.  Interventions:  Diet: CLD>>FLD on 7/1 DVT Prophylaxis: Subcutaneous Lovenox    Advance goals of care discussion: Full code  Family Communication: family was not present at bedside, at the time of interview.  The pt provided permission to discuss medical plan with the family. Opportunity was given to ask question and all questions were answered satisfactorily.   Disposition:  Pt is from group home DSS is guardian, admitted with pancolitis, still has abdominal pain, on full liquid diet and IV antibiotics, which precludes a safe discharge. Discharge to group home, when stable, may need few days to improve.  Subjective: No significant events overnight, patient still complaining of pain in the left lower quadrant 9/10, had 4  episodes last night and 3 episodes today morning of diarrhea.  Patient is requesting to advance diet and also asking for more pain medications. Will continue to manage as above.  Patient was informed about C. difficile infection.   Physical Exam: General: NAD, lying  comfortably Appear in no distress, affect appropriate Eyes: PERRLA ENT: Oral Mucosa Clear, moist  Neck: no JVD,  Cardiovascular: S1 and S2 Present, no Murmur,  Respiratory: good respiratory effort, Bilateral Air entry equal and Decreased, no Crackles, no wheezes Abdomen: BS present, Soft and LLQ tenderness,  Skin: no rashes Extremities: no Pedal edema, no calf tenderness Neurologic: without any new focal findings Gait not checked due to patient safety concerns  Vitals:   05/21/24 2027 05/22/24 0445 05/22/24 0727 05/22/24 1403  BP: 102/62 121/80 120/63 115/68  Pulse: (!) 46 83  (!) 51  Resp: 18     Temp: 98.7 F (37.1 C) 99 F (37.2 C) 98.1 F (36.7 C)   TempSrc: Oral Oral Oral   SpO2: 93% 95%    Weight:      Height:       No intake or output data in the 24 hours ending 05/22/24 1511  Filed Weights   05/20/24 1754  Weight: 65.3 kg    Data Reviewed: I have personally reviewed and interpreted daily labs, tele strips, imagings as discussed above. I reviewed all nursing notes, pharmacy notes, vitals, pertinent old records I have discussed plan of care as described above with RN and patient/family.  CBC: Recent Labs  Lab 05/20/24 1759 05/21/24 0439 05/22/24 0600  WBC 11.5* 6.1 4.5  HGB 14.2 10.8* 10.9*  HCT 41.1 31.2* 32.6*  MCV 89.2 89.9 90.8  PLT 199 115* 125*   Basic Metabolic Panel: Recent Labs  Lab 05/20/24 1759 05/21/24 0439 05/22/24 0600  NA 131* 134* 138  K 3.5 3.4* 3.6  CL 94* 100 106  CO2 26 27 27   GLUCOSE 107* 105* 119*  BUN 10 8 <5*  CREATININE 0.66 0.47* 0.46*  CALCIUM 8.7* 7.8* 7.9*  MG  --  1.6* 1.9  PHOS  --  2.7 2.4*    Studies: No results found.   Scheduled Meds:  acetaminophen   650 mg Oral TID   buprenorphine -naloxone   0.5 tablet Sublingual QID   carbamazepine   100 mg Oral BID   enoxaparin  (LOVENOX ) injection  40 mg Subcutaneous Q24H   escitalopram   20 mg Oral Daily   fidaxomicin  200 mg Oral BID   fluticasone   furoate-vilanterol  1 puff Inhalation Daily   folic acid   1 mg Oral Daily   gabapentin   300 mg Oral TID   guaiFENesin   600 mg Oral BID   loratadine   10 mg Oral Daily   losartan   25 mg Oral Daily   methocarbamol   750 mg Oral TID   mirtazapine   15 mg Oral QHS   pantoprazole   40 mg Oral BID   phosphorus  500 mg Oral QID   primidone   250 mg Oral TID   primidone   50 mg Oral TID   QUEtiapine   50 mg Oral QHS   rosuvastatin  40 mg Oral Daily   saccharomyces boulardii  250 mg Oral BID   [START ON 05/26/2024] Vitamin D  (Ergocalciferol )  50,000 Units Oral Weekly   Continuous Infusions:   PRN Meds: albuterol , fentaNYL  (SUBLIMAZE ) injection, hydrOXYzine , ondansetron  **OR** ondansetron  (ZOFRAN ) IV, oxyCODONE , traZODone   Time spent: 55 minutes  Author: ELVAN SOR. MD [facility] 05/22/2024 3:11 PM  To reach On-call,  see care teams to locate the attending and reach out to them via [email or web address]. If 7PM-7AM, please contact night-coverage If you still have difficulty reaching the attending provider, please page the [facility] (Director on Call) for [facility] on amion for assistance.

## 2024-05-23 ENCOUNTER — Other Ambulatory Visit: Payer: Self-pay

## 2024-05-23 DIAGNOSIS — K5100 Ulcerative (chronic) pancolitis without complications: Secondary | ICD-10-CM | POA: Diagnosis not present

## 2024-05-23 LAB — BASIC METABOLIC PANEL WITH GFR
Anion gap: 9 (ref 5–15)
BUN: 6 mg/dL (ref 6–20)
CO2: 28 mmol/L (ref 22–32)
Calcium: 7.9 mg/dL — ABNORMAL LOW (ref 8.9–10.3)
Chloride: 103 mmol/L (ref 98–111)
Creatinine, Ser: 0.41 mg/dL — ABNORMAL LOW (ref 0.61–1.24)
GFR, Estimated: >60 mL/min (ref >60)
Glucose, Bld: 100 mg/dL — ABNORMAL HIGH (ref 70–99)
Potassium: 3.2 mmol/L — ABNORMAL LOW (ref 3.5–5.1)
Sodium: 140 mmol/L (ref 135–145)

## 2024-05-23 LAB — MAGNESIUM: Magnesium: 1.8 mg/dL (ref 1.7–2.4)

## 2024-05-23 LAB — CBC
HCT: 35.2 % — ABNORMAL LOW (ref 39.0–52.0)
Hemoglobin: 11.7 g/dL — ABNORMAL LOW (ref 13.0–17.0)
MCH: 30.5 pg (ref 26.0–34.0)
MCHC: 33.2 g/dL (ref 30.0–36.0)
MCV: 91.7 fL (ref 80.0–100.0)
Platelets: 163 10*3/uL (ref 150–400)
RBC: 3.84 MIL/uL — ABNORMAL LOW (ref 4.22–5.81)
RDW: 13.6 % (ref 11.5–15.5)
WBC: 4.3 10*3/uL (ref 4.0–10.5)
nRBC: 0 % (ref 0.0–0.2)

## 2024-05-23 LAB — PHOSPHORUS: Phosphorus: 3.5 mg/dL (ref 2.5–4.6)

## 2024-05-23 MED ORDER — FIDAXOMICIN 200 MG PO TABS
200.0000 mg | ORAL_TABLET | Freq: Two times a day (BID) | ORAL | 0 refills | Status: AC
  Filled 2024-05-23: qty 16, 8d supply, fill #0

## 2024-05-23 MED ORDER — SACCHAROMYCES BOULARDII 250 MG PO CAPS
250.0000 mg | ORAL_CAPSULE | Freq: Two times a day (BID) | ORAL | 0 refills | Status: AC
  Filled 2024-05-23: qty 50, 25d supply, fill #0

## 2024-05-23 MED ORDER — POTASSIUM CHLORIDE CRYS ER 20 MEQ PO TBCR
40.0000 meq | EXTENDED_RELEASE_TABLET | ORAL | Status: AC
  Administered 2024-05-23 (×2): 40 meq via ORAL
  Filled 2024-05-23 (×2): qty 2

## 2024-05-23 MED ORDER — ACETAMINOPHEN 325 MG PO TABS: 650.0000 mg | ORAL_TABLET | Freq: Four times a day (QID) | ORAL | Status: DC | PRN

## 2024-05-23 NOTE — Discharge Summary (Addendum)
 [facility] Discharge Summary   Patient: [name]  PCP: Carlette Benita Area, MD  Date of admission: 05/20/2024   Date of discharge:  05/23/2024     Discharge Diagnoses:  Principal Problem:   Pancolitis [facility]) Active Problems:   Hyponatremia   Dyslipidemia   GERD without esophagitis   Seizure disorder (HCC) and pseudoseizures   Anxiety and depression   Essential hypertension   Asthma, chronic   Admitted From: Group home Disposition:  Group home  Recommendations for Outpatient Follow-up:  F/u with PCP in 1 wk,  Follow up LABS/TEST: repeat CBC and BMP in 1wk   Diet recommendation: Low fiber soft diet for few days and then regular diet   Activity: The patient is advised to gradually reintroduce usual activities, as tolerated  Discharge Condition: stable  Code Status: Full code   History of present illness: As per the H and P dictated on admission.  Hospital Course:  [name] is a 48 y.o. Caucasian male with medical history significant for anxiety, asthma, CHF, COPD, depression, GERD and hypertension, who presented to the emergency room with acute onset of left lower quadrant abdominal pain which has been going on over the last few days with associated fever today that was up to 102.  He admitted to diarrhea with watery bowel movements.  He denied any nausea or vomiting or constipation.  No dysuria, oliguria or hematuria or flank pain.  No chest pain or palpitations.  No cough or wheezing or dyspnea.  No melena or bright red bleeding per rectum.  No bleeding diathesis.  He is taking any recent antibiotics.   ED Course: When the patient came to the ER, temperature was 102.5 and otherwise vital signs were within normal.  Labs revealed hyponatremia 131 and hypochloremia of 94, with calcium of 8.7 otherwise unremarkable CMP.  CBC showed leukocytosis of 11.5 and lactic acid was 1.2.  UA came back with elevated specific gravity of 1039.  Blood culture were  drawn. EKG as reviewed by me : None. Imaging: Abdominal and pelvic CT scan with contrast revealed the following: 1. Severe pancolitis with prominent submucosal edema. Differential would include infectious colitis, drug induced colitis (including pseudomembranous colitis) or inflammatory bowel disease. No evidence of inflammatory bowel disease on comparison CT. 2. No vascular abnormality.   The patient was given 1 L bolus of IV normal saline, 4 mg of IV Zofran , 1 mg of IV Dilaudid  and 100 mcg of IV fentanyl , 400 mg of IV Cipro  and 500 mg of IV Flagyl .  The patient will be admitted to a medical telemetry bed for further evaluation and management.     Assessment and Plan:   # Acute Pancolitis due C. difficile infection S/p Cipro  and Flagyl , DC'd on 7/2. Continue probiotics twice daily 7/2 started Dificid 200 mg p.o. twice daily for 10 days as per GI C. difficile positive, GIP negative. S/p FLD, advance to soft diet on 7/2, tolerating well. Continue PRN Pain management GI consulted, recommended no endoluminal exam at this time  S/p IV fluid for hydration.  Continue oral hydration.  Replete BMP after 1 week   # Hypokalemia, potassium repleted. # Hypomagnesemia, mag repleted.  Resolved # Hyponatremia, resolved, s/p IV fluid and monitor  # Dyslipidemia: continue statin therapy. # GERD without esophagitis: Continue PPI therapy. # Asthma, chronic: continue his inhalers. #  Essential hypertension: continue antihypertensive therapy. # Anxiety and depression: continue Lexapro , Remeron  and Seroquel . # Seizure disorder and pseudoseizures: continue primidone .  Body mass index is 20.08 kg/m.  Nutrition Interventions:  Pain control  - [address]  Controlled Substance Reporting System database could not be reviewed, as website was not working. - Resumed Suboxone  home dose.  - Patient was instructed, not to drive, operate heavy machinery, perform activities at heights, swimming or  participation in water activities or provide baby sitting services while on Pain, Sleep and Anxiety Medications; until his outpatient Physician has advised to do so again.  - Also recommended to not to take more than prescribed Pain, Sleep and Anxiety Medications.  Patient was ambulatory without any assistance. On the day of the discharge the patient's vitals were stable, and no other acute medical condition were reported by patient. the patient was felt safe to be discharge at group home.   Consultants: GI Procedures: None  Discharge Exam: General: Appear in no distress, no Rash; Oral Mucosa Clear, moist. Cardiovascular: S1 and S2 Present, no Murmur, Respiratory: normal respiratory effort, Bilateral Air entry present and no Crackles, no wheezes Abdomen: Bowel Sound present, Soft and no tenderness, no hernia Extremities: no Pedal edema, no calf tenderness Neurology: alert and oriented to time, place, and person affect appropriate.  Filed Weights   05/20/24 1754  Weight: 65.3 kg   Vitals:   05/23/24 0311 05/23/24 0839  BP: (!) 94/53 (!) 142/81  Pulse: 91 (!) 57  Resp: 20 18  Temp: 98.5 F (36.9 C) 98.3 F (36.8 C)  SpO2: 97% 96%    DISCHARGE MEDICATION: Allergies as of 05/23/2024       Reactions   Tramadol  Nausea Only, Rash   Codeine Nausea Only   Ibuprofen  Nausea Only   Morphine     UNSPECIFIED REACTION - shaking, diaphoresis   Morphine  And Codeine    UNSPECIFIED REACTION - shaking, diaphoresis   Penicillins Nausea And Vomiting   Did it involve swelling of the face/tongue/throat, SOB, or low BP? No Did it involve sudden or severe rash/hives, skin peeling, or any reaction on the inside of your mouth or nose? No Did you need to seek medical attention at a hospital or doctor's office? No When did it last happen?      10+years If all above answers are "NO", may proceed with cephalosporin use.        Medication List     PAUSE taking these medications    furosemide   20 MG tablet Wait to take this until your doctor or other care provider tells you to start again. Commonly known as: LASIX  Take 1 tablet (20 mg total) by mouth daily.   guaiFENesin  600 MG 12 hr tablet Wait to take this until your doctor or other care provider tells you to start again. Commonly known as: MUCINEX  Take 600 mg by mouth 2 (two) times daily. For sinus headache       STOP taking these medications    loperamide  2 MG capsule Commonly known as: IMODIUM        TAKE these medications    acetaminophen  325 MG tablet Commonly known as: TYLENOL  Take 2 tablets (650 mg total) by mouth every 6 (six) hours as needed for headache, fever or mild pain (pain score 1-3).   albuterol  108 (90 Base) MCG/ACT inhaler Commonly known as: VENTOLIN  HFA Inhale 1 puff into the lungs 4 (four) times daily as needed for wheezing or shortness of breath.   buprenorphine -naloxone  8-2 mg Subl SL tablet Commonly known as: SUBOXONE  Place 0.5 tablets under the tongue 4 (four) times daily.   carbamazepine  100 MG  12 hr tablet Commonly known as: TEGRETOL  XR Take 100 mg by mouth 2 (two) times daily.   Dificid 200 MG Tabs tablet Generic drug: fidaxomicin Take 1 tablet (200 mg total) by mouth 2 (two) times daily for 8 days.   ergocalciferol  1.25 MG (50000 UT) capsule Commonly known as: VITAMIN D2 Take 50,000 Units by mouth once a week.   escitalopram  20 MG tablet Commonly known as: LEXAPRO  Take 20 mg by mouth daily.   Florastor 250 MG capsule Generic drug: saccharomyces boulardii Take 1 capsule (250 mg total) by mouth 2 (two) times daily for 12 days.   fluticasone -salmeterol 250-50 MCG/ACT Aepb Commonly known as: ADVAIR Inhale 1 puff into the lungs in the morning and at bedtime.   folic acid  1 MG tablet Commonly known as: FOLVITE  Take 1 mg by mouth daily.   gabapentin  300 MG capsule Commonly known as: NEURONTIN  Take 300 mg by mouth 3 (three) times daily.   hydrOXYzine  50 MG  tablet Commonly known as: ATARAX  Take 50 mg by mouth every 6 (six) hours as needed for anxiety.   losartan  25 MG tablet Commonly known as: COZAAR  Take 25 mg by mouth daily.   methocarbamol  750 MG tablet Commonly known as: ROBAXIN  Take 750 mg by mouth 3 (three) times daily.   mirtazapine  15 MG tablet Commonly known as: REMERON  Take 15 mg by mouth at bedtime.   omeprazole 40 MG capsule Commonly known as: PRILOSEC Take 40 mg by mouth daily.   primidone  250 MG tablet Commonly known as: MYSOLINE  Take 250 mg by mouth 3 (three) times daily.   primidone  50 MG tablet Commonly known as: MYSOLINE  Take 50 mg by mouth 3 (three) times daily.   QUEtiapine  25 MG tablet Commonly known as: SEROQUEL  Take 50 mg by mouth at bedtime.   Repatha SureClick 140 MG/ML Soaj Generic drug: Evolocumab Inject 140 mg into the skin every 14 (fourteen) days.   rosuvastatin 40 MG tablet Commonly known as: CRESTOR Take 40 mg by mouth daily.       Allergies  Allergen Reactions   Tramadol  Nausea Only and Rash   Codeine Nausea Only   Ibuprofen  Nausea Only   Morphine      UNSPECIFIED REACTION - shaking, diaphoresis   Morphine  And Codeine     UNSPECIFIED REACTION - shaking, diaphoresis   Penicillins Nausea And Vomiting    Did it involve swelling of the face/tongue/throat, SOB, or low BP? No Did it involve sudden or severe rash/hives, skin peeling, or any reaction on the inside of your mouth or nose? No Did you need to seek medical attention at a hospital or doctor's office? No When did it last happen?      10+years If all above answers are "NO", may proceed with cephalosporin use.    Discharge Instructions     Call MD for:  difficulty breathing, headache or visual disturbances   Complete by: As directed    Call MD for:  extreme fatigue   Complete by: As directed    Call MD for:  persistant dizziness or light-headedness   Complete by: As directed    Call MD for:  persistant nausea and  vomiting   Complete by: As directed    Call MD for:  severe uncontrolled pain   Complete by: As directed    Call MD for:  temperature >100.4   Complete by: As directed    Diet general   Complete by: As directed    Soft Diet, low Fiber  for few days and then regular diet.   Discharge instructions   Complete by: As directed    F/u with PCP in 1 wk, repeat CBC and BMP in 1wk   Increase activity slowly   Complete by: As directed        The results of significant diagnostics from this hospitalization (including imaging, microbiology, ancillary and laboratory) are listed below for reference.    Significant Diagnostic Studies: CT ABDOMEN PELVIS W CONTRAST Result Date: 05/20/2024 CLINICAL DATA:  LEFT lower quadrant pain. EXAM: CT ABDOMEN AND PELVIS WITH CONTRAST TECHNIQUE: Multidetector CT imaging of the abdomen and pelvis was performed using the standard protocol following bolus administration of intravenous contrast. RADIATION DOSE REDUCTION: This exam was performed according to the departmental dose-optimization program which includes automated exposure control, adjustment of the mA and/or kV according to patient size and/or use of iterative reconstruction technique. CONTRAST:  80mL OMNIPAQUE  IOHEXOL  300 MG/ML  SOLN COMPARISON:  CT 06/11/2021 FINDINGS: Lower chest: Band of linear atelectasis in LEFT lower lobe is chronic. No evidence of pneumonia. Hepatobiliary: No focal hepatic lesion. Normal gallbladder. No biliary duct dilatation. Common bile duct is normal. Pancreas: Pancreas is normal. No ductal dilatation. No pancreatic inflammation. Spleen: Normal spleen Adrenals/urinary tract: Adrenal glands and kidneys are normal. The ureters and bladder normal. Stomach/Bowel: Stomach, duodenum and small-bowel normal. Appendix is normal. Terminal ileum is grossly normal. There is submucosal edema enhancement involving the ascending, transverse, and descending colon. Submucosal edema in the sigmoid colon.  This submucosal edema is severe with thumbprinting sign in the colon. For example image 32/2 in the transverse colon. The rectum is fluids filled with mild mucosal enhancement. Small amount fluid along the pericolic gutters in the pelvis. No diverticulosis. No perforation. No abscess. No mesenteric lymphadenopathy. Vascular/Lymphatic: Abdominal aorta is normal caliber. No periportal or retroperitoneal adenopathy. No pelvic adenopathy. Reproductive: Unremarkable Other: No free fluid. Musculoskeletal: No aggressive osseous lesion. IMPRESSION: 1. Severe pancolitis with prominent submucosal edema. Differential would include infectious colitis, drug induced colitis (including pseudomembranous colitis) or inflammatory bowel disease. No evidence of inflammatory bowel disease on comparison CT. 2. No vascular abnormality. Electronically Signed   By: Jackquline Boxer M.D.   On: 05/20/2024 19:19   DG Chest Portable 1 View Result Date: 05/06/2024 CLINICAL DATA:  Shortness of breath EXAM: PORTABLE CHEST 1 VIEW COMPARISON:  October 11, 2022 FINDINGS: Right hilar infrahilar right lower lobe and minimal lingular infiltrates correlate with the developing bronchopneumonia IMPRESSION: Right hilar infrahilar right lower lobe and minimal lingular infiltrates correlate with the developing bronchopneumonia. Electronically Signed   By: Franky Chard M.D.   On: 05/06/2024 10:33   CT Cervical Spine Wo Contrast Result Date: 05/06/2024 CLINICAL DATA:  48 year old male with altered mental status, nausea, headache. EXAM: CT CERVICAL SPINE WITHOUT CONTRAST TECHNIQUE: Multidetector CT imaging of the cervical spine was performed without intravenous contrast. Multiplanar CT image reconstructions were also generated. RADIATION DOSE REDUCTION: This exam was performed according to the departmental dose-optimization program which includes automated exposure control, adjustment of the mA and/or kV according to patient size and/or use of  iterative reconstruction technique. COMPARISON:  Head CT today.  Cervical spine CT 05/02/2017. FINDINGS: Alignment: Chronic but progressed reversal of the normal cervical lordosis since 2018, with stable levels of involvement especially C4-C5. Mild degenerative appearing anterolisthesis at that level has progressed while similar degenerative anterolisthesis of C7 on T1 is stable. Bilateral posterior element alignment is within normal limits. Skull base and vertebrae: Mild motion artifact, most pronounced at  the C5 and C6 levels. Visualized skull base is intact. No atlanto-occipital dissociation. C1 and C2 appear intact and aligned. Degenerative appearing C6 vertebral body sclerosis, associated endplate irregularity is new since 2018. No acute osseous abnormality identified. Soft tissues and spinal canal: No prevertebral fluid or swelling. No visible canal hematoma. Mild motion artifact. Negative visible noncontrast neck soft tissues. Disc levels: Chronic but progressed cervical spine degeneration since 2018, including bilateral facet arthropathy, degenerative appearing anterolisthesis C4-C5 and C7-T1. Advanced chronic disc and endplate degeneration at C5-C6 and C6-C7 has progressed. Less pronounced C4-C5 disc degeneration but new vacuum disc there. Up to mild associated spinal stenosis. Upper chest: Visible upper thoracic levels appear stable since 2018, mild chronic T3 compression fracture. Chronic lung disease partially visible, increased apical scarring and architectural distortion since 2018. Additional sub solid peribronchial opacity in the lung apices is new (series 4, image 105 on the right). IMPRESSION: 1. Mild motion artifact, no acute traumatic injury identified in the cervical spine. 2. 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 3. Chronic apical lung disease, but superimposed peribronchial sub solid opacity raising the possibility of acute  infection/bronchopneumonia. Electronically Signed   By: VEAR Hurst M.D.   On: 05/06/2024 10:24   CT Head Wo Contrast Result Date: 05/06/2024 CLINICAL DATA:  48 year old male with altered mental status, nausea, headache. EXAM: CT HEAD WITHOUT CONTRAST TECHNIQUE: Contiguous axial images were obtained from the base of the skull through the vertex without intravenous contrast. RADIATION DOSE REDUCTION: This exam was performed according to the departmental dose-optimization program which includes automated exposure control, adjustment of the mA and/or kV according to patient size and/or use of iterative reconstruction technique. COMPARISON:  Head CT 05/02/2017. FINDINGS: Brain: Stable cerebral volume. No midline shift, ventriculomegaly, mass effect, evidence of mass lesion, intracranial hemorrhage or evidence of cortically based acute infarction. Gray-white matter differentiation is within normal limits throughout the brain. Vascular: No suspicious intracranial vascular hyperdensity. Skull: Appears stable and intact. Mild motion artifact at the skull base today. Sinuses/Orbits: Visualized paranasal sinuses and mastoids are stable and well aerated. Other: Visualized orbits and scalp soft tissues are within normal limits. IMPRESSION: Stable and negative noncontrast Head CT. Electronically Signed   By: VEAR Hurst M.D.   On: 05/06/2024 10:20    Microbiology: Recent Results (from the past 240 hours)  Blood culture (routine x 2)     Status: None (Preliminary result)   Collection Time: 05/20/24  5:59 PM   Specimen: BLOOD  Result Value Ref Range Status   Specimen Description BLOOD BLOOD LEFT ARM  Final   Special Requests   Final    BOTTLES DRAWN AEROBIC AND ANAEROBIC Blood Culture adequate volume   Culture   Final    NO GROWTH 3 DAYS Performed at [facility], [address] [place], [place] [address]    Report Status PENDING  Incomplete  Blood culture (routine x 2)     Status: None (Preliminary result)    Collection Time: 05/20/24  6:41 PM   Specimen: BLOOD  Result Value Ref Range Status   Specimen Description BLOOD BLOOD RIGHT ARM  Final   Special Requests   Final    BOTTLES DRAWN AEROBIC AND ANAEROBIC Blood Culture adequate volume   Culture   Final    NO GROWTH 3 DAYS Performed at [facility], [address] [place], [place] [address]    Report Status PENDING  Incomplete  C Difficile Quick Screen w PCR reflex  Status: Abnormal   Collection Time: 05/21/24 12:55 PM   Specimen: STOOL  Result Value Ref Range Status   C Diff antigen POSITIVE (A) NEGATIVE Final   C Diff toxin NEGATIVE NEGATIVE Final   C Diff interpretation Results are indeterminate. See PCR results.  Final    Comment: Performed at [facility], [address] [place], [place] [address]  Gastrointestinal Panel by PCR , Stool     Status: None   Collection Time: 05/21/24 12:55 PM   Specimen: STOOL  Result Value Ref Range Status   Campylobacter species NOT DETECTED NOT DETECTED Final   Plesimonas shigelloides NOT DETECTED NOT DETECTED Final   Salmonella species NOT DETECTED NOT DETECTED Final   Yersinia enterocolitica NOT DETECTED NOT DETECTED Final   Vibrio species NOT DETECTED NOT DETECTED Final   Vibrio cholerae NOT DETECTED NOT DETECTED Final   Enteroaggregative E coli (EAEC) NOT DETECTED NOT DETECTED Final   Enteropathogenic E coli (EPEC) NOT DETECTED NOT DETECTED Final   Enterotoxigenic E coli (ETEC) NOT DETECTED NOT DETECTED Final   Shiga like toxin producing E coli (STEC) NOT DETECTED NOT DETECTED Final   Shigella/Enteroinvasive E coli (EIEC) NOT DETECTED NOT DETECTED Final   Cryptosporidium NOT DETECTED NOT DETECTED Final   Cyclospora cayetanensis NOT DETECTED NOT DETECTED Final   Entamoeba histolytica NOT DETECTED NOT DETECTED Final   Giardia lamblia NOT DETECTED NOT DETECTED Final   Adenovirus F40/41 NOT DETECTED NOT DETECTED Final   Astrovirus NOT DETECTED NOT DETECTED Final    Norovirus GI/GII NOT DETECTED NOT DETECTED Final   Rotavirus A NOT DETECTED NOT DETECTED Final   Sapovirus (I, II, IV, and V) NOT DETECTED NOT DETECTED Final    Comment: Performed at [facility], [address] [place], [place] [address]  C. Diff by PCR, Reflexed     Status: Abnormal   Collection Time: 05/21/24 12:55 PM  Result Value Ref Range Status   Toxigenic C. Difficile by PCR POSITIVE (A) NEGATIVE Final    Comment: Positive for toxigenic C. difficile with little to no toxin production. Only treat if clinical presentation suggests symptomatic illness. Performed at [facility], [address] [place], [place] [address]      Labs: CBC: Recent Labs  Lab 05/20/24 1759 05/21/24 0439 05/22/24 0600 05/23/24 0431  WBC 11.5* 6.1 4.5 4.3  HGB 14.2 10.8* 10.9* 11.7*  HCT 41.1 31.2* 32.6* 35.2*  MCV 89.2 89.9 90.8 91.7  PLT 199 115* 125* 163   Basic Metabolic Panel: Recent Labs  Lab 05/20/24 1759 05/21/24 0439 05/22/24 0600 05/23/24 0431  NA 131* 134* 138 140  K 3.5 3.4* 3.6 3.2*  CL 94* 100 106 103  CO2 26 27 27 28   GLUCOSE 107* 105* 119* 100*  BUN 10 8 <5* 6  CREATININE 0.66 0.47* 0.46* 0.41*  CALCIUM 8.7* 7.8* 7.9* 7.9*  MG  --  1.6* 1.9 1.8  PHOS  --  2.7 2.4* 3.5   Liver Function Tests: Recent Labs  Lab 05/20/24 1759  AST 15  ALT 15  ALKPHOS 85  BILITOT 1.0  PROT 7.6  ALBUMIN 3.9   Recent Labs  Lab 05/20/24 1759  LIPASE 20   No results for input(s): AMMONIA in the last 168 hours. Cardiac Enzymes: No results for input(s): CKTOTAL, CKMB, CKMBINDEX, TROPONINI in the last 168 hours. BNP (last 3 results) Recent Labs    05/06/24 0949  BNP 55.3   CBG: No results for input(s): GLUCAP in the last  168 hours.  Time spent: 35 minutes  Signed:  Elvan Sor  [facility] 05/23/2024 12:58 PM

## 2024-05-23 NOTE — TOC Transition Note (Signed)
 Transition of Care [facility]) - Discharge Note   Patient Details  Name: [name] MRN: [id number] Date of Birth: 1976-02-20  [facility] CM/SW Contact:  Lauraine JAYSON Carpen, LCSW Phone Number: 05/23/2024, 1:58 PM   Clinical Narrative:   Patient has orders to discharge back to [facility]. CSW called facility. They said to send FL2 and discharge summary with patient. RN called report. Legal guardian is at bedside to transport. No further concerns. CSW signing off.  Final next level of care: Group Home Barriers to Discharge: Barriers Resolved   Patient Goals and CMS Choice            Discharge Placement                Patient to be transferred to facility by: Legal guardian Name of family member notified: [place] Patient and family notified of of transfer: 05/23/24  Discharge Plan and Services Additional resources added to the After Visit Summary for       Post Acute Care Choice: Resumption of Svcs/PTA Provider                               Social Drivers of Health (SDOH) Interventions SDOH Screenings   Food Insecurity: No Food Insecurity (05/20/2024)  Housing: Low Risk  (05/20/2024)  Transportation Needs: No Transportation Needs (05/20/2024)  Utilities: Not At Risk (05/20/2024)  Social Connections: Socially Isolated (05/20/2024)  Tobacco Use: High Risk (05/20/2024)     Readmission Risk Interventions    05/22/2024    8:20 AM  Readmission Risk Prevention Plan  PCP or Specialist Appt within 3-5 Days Complete  Social Work Consult for Recovery Care Planning/Counseling Complete  Palliative Care Screening Not Applicable

## 2024-05-23 NOTE — TOC Progression Note (Signed)
 Transition of Care [facility]) - Progression Note    Patient Details  Name: [name] MRN: [id number] Date of Birth: 03/04/1976  Transition of Care [facility]) CM/SW Contact  Lauraine JAYSON Carpen, LCSW Phone Number: 05/23/2024, 1:06 PM  Clinical Narrative:  Left voicemail for group home owner to see if she wants FL2 and discharge summary to be faxed to her or sent with patient.   Expected Discharge Plan: Group Home Barriers to Discharge: Continued Medical Work up  Expected Discharge Plan and Services     Post Acute Care Choice: Resumption of Svcs/PTA Provider Living arrangements for the past 2 months: Group Home Expected Discharge Date: 05/23/24                                     Social Determinants of Health (SDOH) Interventions SDOH Screenings   Food Insecurity: No Food Insecurity (05/20/2024)  Housing: Low Risk  (05/20/2024)  Transportation Needs: No Transportation Needs (05/20/2024)  Utilities: Not At Risk (05/20/2024)  Social Connections: Socially Isolated (05/20/2024)  Tobacco Use: High Risk (05/20/2024)    Readmission Risk Interventions    05/22/2024    8:20 AM  Readmission Risk Prevention Plan  PCP or Specialist Appt within 3-5 Days Complete  Social Work Consult for Recovery Care Planning/Counseling Complete  Palliative Care Screening Not Applicable

## 2024-05-23 NOTE — Progress Notes (Signed)
 Discharge instructions delivered, questions answered. Legal guardian [place]s present at bedside for instructions. IV removed without complications. [facility] pharmacy delivered prescriptions to patient at bedside. Patient transporting via car by legal guardian back to group home [facility]. Report given to group home by Clinical research associate.

## 2024-05-23 NOTE — NC FL2 (Signed)
 [place]  MEDICAID [id number] LEVEL OF CARE FORM     IDENTIFICATION  Patient Name: [name] Birthdate: 1975/12/07 Sex: male Admission Date (Current Location): 05/20/2024  [facility] and [place] Number:  Chiropodist and Address:  [facility], [address] [place], [place] [address]      Provider Number: [id number]  Attending Physician Name and Address:  Von Bellis, MD  Relative Name and Phone Number:       Current Level of Care: Hospital Recommended Level of Care: [facility] Prior Approval Number:    Date Approved/Denied:   PASRR Number:    Discharge Plan: Other (Comment) (Family Care Home)    Current Diagnoses: Patient Active Problem List   Diagnosis Date Noted   Pancolitis (HCC) 05/20/2024   Dyslipidemia 05/20/2024   GERD without esophagitis 05/20/2024   Anxiety and depression 05/20/2024   Essential hypertension 05/20/2024   Asthma, chronic 05/20/2024   Pneumonia 05/06/2024   Lymphedema 03/12/2023   Diarrhea 10/31/2022   Dysphagia 03/24/2022   Chronic diastolic HF (heart failure) (HCC) 09/14/2021   Closed fracture of right proximal tibia 11/05/2019   Closed displaced bicondylar fracture of right tibia 12/03/2018   Cellulitis of left lower extremity    Cellulitis of left foot 03/07/2018   COPD (chronic obstructive pulmonary disease) (HCC) 05/03/2017   Hyponatremia 06/27/2016   Gastroesophageal reflux disease 11/11/2015   Borderline personality disorder (HCC) 11/11/2015   Alcohol  use disorder, severe, in controlled environment (HCC) 11/11/2015   MDD (major depressive disorder), recurrent severe, 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   Tobacco abuse 11/05/2013    Orientation RESPIRATION BLADDER Height & Weight     Self, Time, Situation, Place  Normal Continent Weight: 144 lb (65.3 kg) Height:  5' 11 (180.3 cm)  BEHAVIORAL SYMPTOMS/MOOD NEUROLOGICAL BOWEL NUTRITION  STATUS   (None)  (Seizure disorder) Continent Diet (Low fiber soft diet for a few days then regular diet)  AMBULATORY STATUS COMMUNICATION OF NEEDS Skin     Verbally Normal                       Personal Care Assistance Level of Assistance              Functional Limitations Info  Sight, Hearing, Speech Sight Info: Adequate Hearing Info: Adequate Speech Info: Adequate    SPECIAL CARE FACTORS FREQUENCY                       Contractures Contractures Info: Not present    Additional Factors Info  Code Status, Allergies Code Status Info: Full code Allergies Info: Tramadol , Codeine, Ibuprofen , Morphine , Morphine  And Codeine, Penicillins           Current Medications (05/23/2024):  This is the current hospital active medication list Current Facility-Administered Medications  Medication Dose Route Frequency Provider Last Rate Last Admin   0.9 %  sodium chloride  infusion   Intravenous Continuous Von Bellis, MD 75 mL/hr at 05/23/24 0356 New Bag at 05/23/24 0356   acetaminophen  (TYLENOL ) tablet 650 mg  650 mg Oral TID Von Bellis, MD   650 mg at 05/23/24 9148   albuterol  (PROVENTIL ) (2.5 MG/3ML) 0.083% nebulizer solution 3 mL  3 mL Nebulization QID PRN Mansy, Jan A, MD       buprenorphine -naloxone  (SUBOXONE ) 8-2 mg per SL tablet 0.5 tablet  0.5 tablet Sublingual QID Mansy, Jan A, MD   0.5 tablet at  05/23/24 0853   carbamazepine  (TEGRETOL  XR) 12 hr tablet 100 mg  100 mg Oral BID Mansy, Jan A, MD   100 mg at 05/23/24 0848   enoxaparin  (LOVENOX ) injection 40 mg  40 mg Subcutaneous Q24H Mansy, Jan A, MD   40 mg at 05/22/24 2038   escitalopram  (LEXAPRO ) tablet 20 mg  20 mg Oral Daily Mansy, Jan A, MD   20 mg at 05/23/24 0851   fentaNYL  (SUBLIMAZE ) injection 25 mcg  25 mcg Intravenous Q4H PRN Von Bellis, MD   25 mcg at 05/23/24 0850   fidaxomicin (DIFICID) tablet 200 mg  200 mg Oral BID Jinny Carmine, MD   200 mg at 05/23/24 0850   fluticasone  furoate-vilanterol (BREO  ELLIPTA) 200-25 MCG/ACT 1 puff  1 puff Inhalation Daily Mansy, Jan A, MD   1 puff at 05/23/24 0853   folic acid  (FOLVITE ) tablet 1 mg  1 mg Oral Daily Mansy, Jan A, MD   1 mg at 05/23/24 9147   gabapentin  (NEURONTIN ) capsule 300 mg  300 mg Oral TID Mansy, Jan A, MD   300 mg at 05/23/24 9147   guaiFENesin  (MUCINEX ) 12 hr tablet 600 mg  600 mg Oral BID Mansy, Jan A, MD   600 mg at 05/23/24 9147   hydrOXYzine  (ATARAX ) tablet 50 mg  50 mg Oral Q6H PRN Mansy, Jan A, MD   50 mg at 05/21/24 0912   loratadine  (CLARITIN ) tablet 10 mg  10 mg Oral Daily Mansy, Jan A, MD   10 mg at 05/23/24 9147   losartan  (COZAAR ) tablet 25 mg  25 mg Oral Daily Von Bellis, MD   25 mg at 05/23/24 0851   methocarbamol  (ROBAXIN ) tablet 750 mg  750 mg Oral TID Mansy, Jan A, MD   750 mg at 05/23/24 9147   mirtazapine  (REMERON ) tablet 15 mg  15 mg Oral QHS Mansy, Jan A, MD   15 mg at 05/22/24 2039   ondansetron  (ZOFRAN ) tablet 4 mg  4 mg Oral Q6H PRN Mansy, Jan A, MD       Or   ondansetron  (ZOFRAN ) injection 4 mg  4 mg Intravenous Q6H PRN Mansy, Jan A, MD   4 mg at 05/20/24 2059   oxyCODONE  (Oxy IR/ROXICODONE ) immediate release tablet 5-10 mg  5-10 mg Oral Q6H PRN Von Bellis, MD   10 mg at 05/23/24 1057   pantoprazole  (PROTONIX ) EC tablet 40 mg  40 mg Oral BID Von Bellis, MD   40 mg at 05/23/24 0851   potassium chloride  SA (KLOR-CON  M) CR tablet 40 mEq  40 mEq Oral Q4H Von Bellis, MD   40 mEq at 05/23/24 0935   primidone  (MYSOLINE ) tablet 250 mg  250 mg Oral TID Mansy, Jan A, MD   250 mg at 05/23/24 0848   primidone  (MYSOLINE ) tablet 50 mg  50 mg Oral TID Mansy, Jan A, MD   50 mg at 05/23/24 0849   QUEtiapine  (SEROQUEL ) tablet 50 mg  50 mg Oral QHS Mansy, Jan A, MD   50 mg at 05/22/24 2038   rosuvastatin (CRESTOR) tablet 40 mg  40 mg Oral Daily Mansy, Jan A, MD   40 mg at 05/23/24 9148   saccharomyces boulardii (FLORASTOR) capsule 250 mg  250 mg Oral BID Von Bellis, MD   250 mg at 05/23/24 0849   traZODone   (DESYREL ) tablet 25 mg  25 mg Oral QHS PRN Mansy, Jan A, MD   25 mg at 05/21/24 2140   [START ON  05/26/2024] Vitamin D  (Ergocalciferol ) (DRISDOL) 1.25 MG (50000 UNIT) capsule 50,000 Units  50,000 Units Oral Weekly Mansy, Madison LABOR, MD         Discharge Medications: PAUSE taking these medications     furosemide  20 MG tablet Wait to take this until your doctor or other care provider tells you to start again. Commonly known as: LASIX  Take 1 tablet (20 mg total) by mouth daily.    guaiFENesin  600 MG 12 hr tablet Wait to take this until your doctor or other care provider tells you to start again. Commonly known as: MUCINEX  Take 600 mg by mouth 2 (two) times daily. For sinus headache           STOP taking these medications     loperamide  2 MG capsule Commonly known as: IMODIUM            TAKE these medications     acetaminophen  325 MG tablet Commonly known as: TYLENOL  Take 2 tablets (650 mg total) by mouth every 6 (six) hours as needed for headache, fever or mild pain (pain score 1-3).    albuterol  108 (90 Base) MCG/ACT inhaler Commonly known as: VENTOLIN  HFA Inhale 1 puff into the lungs 4 (four) times daily as needed for wheezing or shortness of breath.    buprenorphine -naloxone  8-2 mg Subl SL tablet Commonly known as: SUBOXONE  Place 0.5 tablets under the tongue 4 (four) times daily.    carbamazepine  100 MG 12 hr tablet Commonly known as: TEGRETOL  XR Take 100 mg by mouth 2 (two) times daily.    Dificid 200 MG Tabs tablet Generic drug: fidaxomicin Take 1 tablet (200 mg total) by mouth 2 (two) times daily for 8 days.    ergocalciferol  1.25 MG (50000 UT) capsule Commonly known as: VITAMIN D2 Take 50,000 Units by mouth once a week.    escitalopram  20 MG tablet Commonly known as: LEXAPRO  Take 20 mg by mouth daily.    Florastor 250 MG capsule Generic drug: saccharomyces boulardii Take 1 capsule (250 mg total) by mouth 2 (two) times daily for 12 days.     fluticasone -salmeterol 250-50 MCG/ACT Aepb Commonly known as: ADVAIR Inhale 1 puff into the lungs in the morning and at bedtime.    folic acid  1 MG tablet Commonly known as: FOLVITE  Take 1 mg by mouth daily.    gabapentin  300 MG capsule Commonly known as: NEURONTIN  Take 300 mg by mouth 3 (three) times daily.    hydrOXYzine  50 MG tablet Commonly known as: ATARAX  Take 50 mg by mouth every 6 (six) hours as needed for anxiety.    losartan  25 MG tablet Commonly known as: COZAAR  Take 25 mg by mouth daily.    methocarbamol  750 MG tablet Commonly known as: ROBAXIN  Take 750 mg by mouth 3 (three) times daily.    mirtazapine  15 MG tablet Commonly known as: REMERON  Take 15 mg by mouth at bedtime.    omeprazole 40 MG capsule Commonly known as: PRILOSEC Take 40 mg by mouth daily.    primidone  250 MG tablet Commonly known as: MYSOLINE  Take 250 mg by mouth 3 (three) times daily.    primidone  50 MG tablet Commonly known as: MYSOLINE  Take 50 mg by mouth 3 (three) times daily.    QUEtiapine  25 MG tablet Commonly known as: SEROQUEL  Take 50 mg by mouth at bedtime.    Repatha SureClick 140 MG/ML Soaj Generic drug: Evolocumab Inject 140 mg into the skin every 14 (fourteen) days.    rosuvastatin 40 MG tablet  Commonly known as: CRESTOR Take 40 mg by mouth daily.    Relevant Imaging Results:  Relevant Lab Results:   Additional Information SS#: [id number]  Lauraine JAYSON Carpen, LCSW

## 2024-05-23 NOTE — Plan of Care (Signed)
   Problem: Education: Goal: Knowledge of General Education information will improve Description: Including pain rating scale, medication(s)/side effects and non-pharmacologic comfort measures Outcome: Progressing   Problem: Clinical Measurements: Goal: Diagnostic test results will improve Outcome: Progressing   Problem: Activity: Goal: Risk for activity intolerance will decrease Outcome: Progressing   Problem: Pain Managment: Goal: General experience of comfort will improve and/or be controlled Outcome: Progressing

## 2024-05-24 LAB — CALPROTECTIN, FECAL: Calprotectin, Fecal: 1250 ug/g — ABNORMAL HIGH (ref 0–120)

## 2024-05-25 LAB — CULTURE, BLOOD (ROUTINE X 2)
Culture: NO GROWTH
Culture: NO GROWTH
Special Requests: ADEQUATE
Special Requests: ADEQUATE

## 2024-12-20 ENCOUNTER — Other Ambulatory Visit: Payer: Self-pay | Admitting: Nurse Practitioner

## 2024-12-20 DIAGNOSIS — M25532 Pain in left wrist: Secondary | ICD-10-CM

## 2024-12-20 DIAGNOSIS — M25512 Pain in left shoulder: Secondary | ICD-10-CM

## 2024-12-25 ENCOUNTER — Emergency Department

## 2024-12-25 ENCOUNTER — Emergency Department
Admission: EM | Admit: 2024-12-25 | Discharge: 2024-12-25 | Disposition: A | Discharge status: Home / Self Care | Attending: Emergency Medicine | Admitting: Emergency Medicine

## 2024-12-25 ENCOUNTER — Other Ambulatory Visit: Payer: Self-pay

## 2024-12-25 DIAGNOSIS — S52502A Unspecified fracture of the lower end of left radius, initial encounter for closed fracture: Secondary | ICD-10-CM | POA: Insufficient documentation

## 2024-12-25 DIAGNOSIS — I5032 Chronic diastolic (congestive) heart failure: Secondary | ICD-10-CM | POA: Insufficient documentation

## 2024-12-25 DIAGNOSIS — W000XXA Fall on same level due to ice and snow, initial encounter: Secondary | ICD-10-CM | POA: Insufficient documentation

## 2024-12-25 DIAGNOSIS — S42212A Unspecified displaced fracture of surgical neck of left humerus, initial encounter for closed fracture: Secondary | ICD-10-CM | POA: Insufficient documentation

## 2024-12-25 DIAGNOSIS — Y9301 Activity, walking, marching and hiking: Secondary | ICD-10-CM | POA: Insufficient documentation

## 2024-12-25 DIAGNOSIS — J4489 Other specified chronic obstructive pulmonary disease: Secondary | ICD-10-CM | POA: Insufficient documentation

## 2024-12-25 DIAGNOSIS — I110 Hypertensive heart disease with heart failure: Secondary | ICD-10-CM | POA: Insufficient documentation

## 2024-12-25 DIAGNOSIS — N3001 Acute cystitis with hematuria: Secondary | ICD-10-CM | POA: Insufficient documentation

## 2024-12-25 LAB — URINALYSIS, ROUTINE W REFLEX MICROSCOPIC
Bilirubin Urine: NEGATIVE
Glucose, UA: NEGATIVE mg/dL
Ketones, ur: NEGATIVE mg/dL
Leukocytes,Ua: NEGATIVE
Nitrite: NEGATIVE
Protein, ur: 100 mg/dL — AB
Specific Gravity, Urine: 1.019 (ref 1.005–1.030)
pH: 5 (ref 5.0–8.0)

## 2024-12-25 MED ORDER — SULFAMETHOXAZOLE-TRIMETHOPRIM 800-160 MG PO TABS
1.0000 | ORAL_TABLET | Freq: Once | ORAL | Status: AC
  Administered 2024-12-25: 1 via ORAL
  Filled 2024-12-25: qty 1

## 2024-12-25 MED ORDER — FENTANYL CITRATE (PF) 50 MCG/ML IJ SOSY
50.0000 ug | PREFILLED_SYRINGE | Freq: Once | INTRAMUSCULAR | Status: AC
  Administered 2024-12-25: 50 ug via INTRAMUSCULAR
  Filled 2024-12-25: qty 1

## 2024-12-25 MED ORDER — IBUPROFEN 800 MG PO TABS
800.0000 mg | ORAL_TABLET | Freq: Once | ORAL | Status: AC
  Administered 2024-12-25: 800 mg via ORAL
  Filled 2024-12-25: qty 1

## 2024-12-25 MED ORDER — ACETAMINOPHEN 500 MG PO TABS: 1000.0000 mg | ORAL_TABLET | Freq: Three times a day (TID) | ORAL | 0 refills | Status: AC | PRN

## 2024-12-25 MED ORDER — IBUPROFEN 600 MG PO TABS: 600.0000 mg | ORAL_TABLET | Freq: Four times a day (QID) | ORAL | 0 refills | Status: AC | PRN

## 2024-12-25 MED ORDER — ACETAMINOPHEN 500 MG PO TABS
1000.0000 mg | ORAL_TABLET | Freq: Once | ORAL | Status: AC
  Administered 2024-12-25: 1000 mg via ORAL
  Filled 2024-12-25: qty 2

## 2024-12-25 MED ORDER — SULFAMETHOXAZOLE-TRIMETHOPRIM 800-160 MG PO TABS: 1.0000 | ORAL_TABLET | Freq: Two times a day (BID) | ORAL | 0 refills | Status: AC

## 2024-12-25 NOTE — ED Notes (Signed)
 Spoke with group home, informed of UTI and medication sent to pharmacy. Facility pick up is placed

## 2024-12-25 NOTE — Discharge Instructions (Addendum)
 You are seen in the emergency department for a fracture of your left wrist and left shoulder.  Please keep the splint on at all times and shoulder sling.   Please read through the included information about splint care (keep it clean and dry).  If your pain becomes much more severe, the splint becomes too tight, or you feel as if your injured limb is becoming numb or cold, please return immediately to the Emergency Department.  Follow up with the orthopedics specialist (Dr. Edie) listed in this paperwork.  When possible, keep your splint elevated to help with the swelling.  You may also use ice packs over the splint.   You may take Ibuprofen  and Tylenol  based on instructions on the bottles over the counter. Eat when taking the ibuprofen . Combining this with Lexapro  puts you at an increased risk for a bleed of your stomach.  Stop taking the ibuprofen  if you start having stomach cramping or pain and return to the ER for evaluation.       You have been seen in the Emergency Department (ED) today for pain when urinating.  Your workup today suggests that you have a urinary tract infection (UTI).  Please take your antibiotic as prescribed and over-the-counter pain medication (Tylenol  or Motrin ) as needed, but no more than recommended on the label instructions.  Drink PLENTY of fluids.  Call your regular doctor to schedule the next available appointment to follow up on todays ED visit, or return immediately to the ED if your pain worsens, you have decreased urine production, develop fever, persistent vomiting, or other symptoms that concern you.

## 2024-12-25 NOTE — ED Notes (Signed)
 Contacted DSS worker Dorn at this time. DSS requested UTI testing. Provider notified at this time. DSS stated Pt is safe for transport back to facility after urine testing. Aide with Pt at this time

## 2024-12-25 NOTE — ED Triage Notes (Signed)
 Pt to ED from [facility] for left shoulder pain since trip on ice on Friday. Denies hitting head or LOC.   Attempted to contact legal guardian at number listed, no answer. HIPAA compliant VM left.

## 2024-12-25 NOTE — ED Notes (Signed)
 Attempted x1 to contact legal Guardian. Left voicemail at this time, will try again shortly

## 2024-12-25 NOTE — ED Provider Notes (Cosign Needed Addendum)
 "  [facility] Provider Note    Event Date/Time   First MD Initiated Contact with Patient 12/25/24 1217     (approximate)   History   Shoulder Pain   HPI  [name] is a 49 y.o. male  with a past medical history of seizure disorder, BPD, alcohol  use disorder, COPD, chronic diastolic heart failure, hypertension, asthma, anxiety, MDD presents to the emergency department with left shoulder and humeral pain after a fall that occurred on Friday, 1/30.  He reports he was walking outside at  [facility] when he slipped on ice and fell forward onto his left shoulder and arm. He denies hitting his head, neck pain or LOC. Rock, home health aide, is with his at time of encounter. She reports the fall was witnessed by another health aide at the time of the incident.  Home health aide Rock and DSS also requesting checking his urine at this time for UTI given urinary symptoms and some disorientation. Patient denies fever, chills, back pain, abdominal pain, vomiting.  Physical Exam   Triage Vital Signs: ED Triage Vitals  Encounter Vitals Group     BP 12/25/24 1121 122/70     Girls Systolic BP Percentile --      Girls Diastolic BP Percentile --      Boys Systolic BP Percentile --      Boys Diastolic BP Percentile --      Pulse Rate 12/25/24 1121 85     Resp 12/25/24 1121 18     Temp 12/25/24 1121 97.6 F (36.4 C)     Temp src --      SpO2 12/25/24 1121 95 %     Weight 12/25/24 1122 150 lb (68 kg)     Height 12/25/24 1122 5' 11 (1.803 m)     Head Circumference --      Peak Flow --      Pain Score 12/25/24 1122 10     Pain Loc --      Pain Education --      Exclude from Growth Chart --     Most recent vital signs: Vitals:   12/25/24 1121 12/25/24 1754  BP: 122/70 100/81  Pulse: 85 72  Resp: 18 16  Temp: 97.6 F (36.4 C) 98 F (36.7 C)  SpO2: 95% 98%    General: Awake, in no acute distress. Appears stated age. Head: Normocephalic,  atraumatic. Ears/Nose/Throat: TMs intact b/l. Nares patent, no nasal discharge.  Neck: Supple, no midline cervical tenderness. CV: Good peripheral perfusion. RRR 85 bpm. Respiratory:Normal respiratory effort.  No respiratory distress. CTAB. GI: Soft, non-distended, non-tender.  MSK: Limited exam due to pain.  Patient has adequate grip strength of left hand but does not want to flex his elbow or move his shoulder.  He has swelling and bruising throughout the anterior humerus as well as some mild swelling in the left forearm. TTP along left GH joint, upper left humerus and left wrist.  ED Results / Procedures / Treatments   Labs (all labs ordered are listed, but only abnormal results are displayed) Labs Reviewed  URINALYSIS, ROUTINE W REFLEX MICROSCOPIC - Abnormal; Notable for the following components:      Result Value   Color, Urine YELLOW (*)    APPearance CLOUDY (*)    Hgb urine dipstick SMALL (*)    Protein, ur 100 (*)    Bacteria, UA RARE (*)    Non Squamous Epithelial PRESENT (*)  All other components within normal limits      EKG     RADIOLOGY X rays left shoulder, humerus, forearm ordered.   Left shoulder x ray FINDINGS:  There is a mildly comminuted and impacted fracture of the left  humeral head/neck. Associated varus angulation. No dislocation.  Visualized left chest is unremarkable.    IMPRESSION:  Mildly comminuted and impacted fracture of left humeral neck.   Left humerus x ray FINDINGS: An acute, comminuted fracture deformity is seen involving the head and neck of the proximal left humerus. Approximately 2.7 cm impaction of the proximal left humeral shletaft is noted. There is no evidence of dislocation. Soft tissues are unremarkable.   IMPRESSION: Acute fracture of the proximal left humerus.  Left forearm x ray FINDINGS: An acute, comminuted fracture deformity is seen involving the distal left radius with extension to involve the radiocarpal  joint. There is no evidence of dislocation. Mild soft tissue swelling is seen surrounding the previously noted fracture site.   IMPRESSION: Acute fracture of the distal left radius.  PROCEDURES:  Critical Care performed: No   Procedures   MEDICATIONS ORDERED IN ED: Medications  sulfamethoxazole -trimethoprim  (BACTRIM  DS) 800-160 MG per tablet 1 tablet (has no administration in time range)  acetaminophen  (TYLENOL ) tablet 1,000 mg (1,000 mg Oral Given 12/25/24 1318)  fentaNYL  (SUBLIMAZE ) injection 50 mcg (50 mcg Intramuscular Given 12/25/24 1410)  ibuprofen  (ADVIL ) tablet 800 mg (800 mg Oral Given 12/25/24 1502)     IMPRESSION / MDM / ASSESSMENT AND PLAN / ED COURSE  I reviewed the triage vital signs and the nursing notes.                              Differential diagnosis includes, but is not limited to, humeral neck fracture, clavicle fracture, wrist fracture  Patient's presentation is most consistent with acute complicated illness / injury requiring diagnostic workup.  Patient is a 49 year old male presenting with signs and symptoms as described above.  He is neurovascularly intact.  Fall was witnessed.  No head trauma.  Given degree of swelling to the left humerus and forearm region, did order x-rays of the left humerus and forearm.  Left shoulder x-ray ordered in triage.  Left shoulder x-ray with mildly comminuted impacted fracture of the left humeral neck.  Left forearm x-ray with acute distal left radial fracture.  I personally viewed and interpreted these x-rays and agree with the radiologist's findings. Patient given pain medication to treat his pain.  Placed in sugar-tong splint and shoulder sling.  Discussed splint care and splint checked prior to discharge.  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 Will send Rx for tylenol  and ibuprofen . Given patient  is on an SSRI, did mention increased risk for GI bleed with NSAID use and signs and symptoms to watch out for and stop taking ibuprofen  immediately and return to ER for evaluation if experiencing these signs and symptoms.  Information to follow-up with orthopedics outpatient provided.  UA with small hemoglobin, proteinuria, 11-20 white blood cells, rare bacteria, no nitrates or leukocytes.  Will treat for UTI.  Given patient has nausea vomiting allergy to penicillins, will do Bactrim  at this time.  First dose given in the ER will send remainder to pharmacy of choice.  He can follow-up with his PCP following  today's visit.  The patient may return to the emergency department for any new, worsening, or concerning symptoms. Patient was given the opportunity to ask questions; all questions were answered. Emergency department return precautions were discussed with the patient.  Patient is in agreement to the treatment plan.  Patient is stable for discharge.   FINAL CLINICAL IMPRESSION(S) / ED DIAGNOSES   Final diagnoses:  Closed fracture of distal end of left radius, unspecified fracture morphology, initial encounter  Fx humeral neck, left, closed, initial encounter  Acute cystitis with hematuria     Rx / DC Orders   ED Discharge Orders          Ordered    acetaminophen  (TYLENOL ) 500 MG tablet  Every 8 hours PRN        12/25/24 1453    ibuprofen  (ADVIL ) 600 MG tablet  Every 6 hours PRN        12/25/24 1453    sulfamethoxazole -trimethoprim  (BACTRIM  DS) 800-160 MG tablet  2 times daily        12/25/24 1819             Note:  This document was prepared using Dragon voice recognition software and may include unintentional dictation errors.     Sheron Salm, PA-C 12/25/24 1502    Jacolyn Pae, MD 12/25/24 1526    [address], [place], PA-C 12/25/24 1823  "

## 2024-12-25 NOTE — ED Notes (Signed)
 Attempted x2 to contact legal guardian at this time
